# Patient Record
Sex: Female | Born: 1955 | Race: Black or African American | Hispanic: No | Smoking: Never smoker
Health system: Southern US, Community
[De-identification: ages and names within clinical notes are randomized; demographics above are authoritative.]

## PROBLEM LIST (undated history)

## (undated) DIAGNOSIS — I2510 Atherosclerotic heart disease of native coronary artery without angina pectoris: Secondary | ICD-10-CM

## (undated) DIAGNOSIS — I219 Acute myocardial infarction, unspecified: Secondary | ICD-10-CM

## (undated) DIAGNOSIS — I10 Essential (primary) hypertension: Secondary | ICD-10-CM

## (undated) DIAGNOSIS — G43109 Migraine with aura, not intractable, without status migrainosus: Secondary | ICD-10-CM

## (undated) DIAGNOSIS — J45909 Unspecified asthma, uncomplicated: Secondary | ICD-10-CM

## (undated) DIAGNOSIS — D4959 Neoplasm of unspecified behavior of other genitourinary organ: Secondary | ICD-10-CM

## (undated) DIAGNOSIS — E119 Type 2 diabetes mellitus without complications: Secondary | ICD-10-CM

## (undated) DIAGNOSIS — E785 Hyperlipidemia, unspecified: Secondary | ICD-10-CM

## (undated) HISTORY — DX: Hyperlipidemia, unspecified: E78.5

## (undated) HISTORY — DX: Unspecified asthma, uncomplicated: J45.909

## (undated) HISTORY — PX: CARDIAC CATHETERIZATION: SHX172

## (undated) HISTORY — PX: CARDIAC SURGERY: SHX584

## (undated) HISTORY — PX: CHOLECYSTECTOMY: SHX55

## (undated) HISTORY — PX: ABDOMINAL HYSTERECTOMY: SHX81

## (undated) HISTORY — PX: CORONARY ANGIOPLASTY: SHX604

## (undated) HISTORY — DX: Migraine with aura, not intractable, without status migrainosus: G43.109

## (undated) HISTORY — PX: CORONARY ARTERY BYPASS GRAFT: SHX141

## (undated) HISTORY — DX: Acute myocardial infarction, unspecified: I21.9

## (undated) HISTORY — DX: Neoplasm of unspecified behavior of other genitourinary organ: D49.59

---

## 2011-09-13 DIAGNOSIS — N8000 Endometriosis of the uterus, unspecified: Secondary | ICD-10-CM | POA: Diagnosis not present

## 2011-09-13 DIAGNOSIS — D251 Intramural leiomyoma of uterus: Secondary | ICD-10-CM | POA: Diagnosis not present

## 2011-09-13 DIAGNOSIS — J45909 Unspecified asthma, uncomplicated: Secondary | ICD-10-CM | POA: Diagnosis present

## 2011-09-13 DIAGNOSIS — D259 Leiomyoma of uterus, unspecified: Secondary | ICD-10-CM | POA: Diagnosis not present

## 2011-09-13 DIAGNOSIS — IMO0002 Reserved for concepts with insufficient information to code with codable children: Secondary | ICD-10-CM | POA: Diagnosis present

## 2011-09-13 DIAGNOSIS — Z79899 Other long term (current) drug therapy: Secondary | ICD-10-CM | POA: Diagnosis not present

## 2011-09-13 DIAGNOSIS — N871 Moderate cervical dysplasia: Secondary | ICD-10-CM | POA: Diagnosis not present

## 2011-09-13 DIAGNOSIS — N870 Mild cervical dysplasia: Secondary | ICD-10-CM | POA: Diagnosis not present

## 2011-10-27 DIAGNOSIS — R300 Dysuria: Secondary | ICD-10-CM | POA: Diagnosis not present

## 2011-11-01 DIAGNOSIS — K089 Disorder of teeth and supporting structures, unspecified: Secondary | ICD-10-CM | POA: Diagnosis not present

## 2011-11-01 DIAGNOSIS — Z7982 Long term (current) use of aspirin: Secondary | ICD-10-CM | POA: Diagnosis not present

## 2011-11-01 DIAGNOSIS — K047 Periapical abscess without sinus: Secondary | ICD-10-CM | POA: Diagnosis not present

## 2011-11-01 DIAGNOSIS — I10 Essential (primary) hypertension: Secondary | ICD-10-CM | POA: Diagnosis not present

## 2011-11-01 DIAGNOSIS — Z79899 Other long term (current) drug therapy: Secondary | ICD-10-CM | POA: Diagnosis not present

## 2011-11-10 DIAGNOSIS — G43019 Migraine without aura, intractable, without status migrainosus: Secondary | ICD-10-CM | POA: Diagnosis not present

## 2011-11-10 DIAGNOSIS — I259 Chronic ischemic heart disease, unspecified: Secondary | ICD-10-CM | POA: Diagnosis not present

## 2011-11-10 DIAGNOSIS — E119 Type 2 diabetes mellitus without complications: Secondary | ICD-10-CM | POA: Diagnosis not present

## 2011-11-10 DIAGNOSIS — R252 Cramp and spasm: Secondary | ICD-10-CM | POA: Diagnosis not present

## 2011-11-10 DIAGNOSIS — I10 Essential (primary) hypertension: Secondary | ICD-10-CM | POA: Diagnosis not present

## 2011-11-10 DIAGNOSIS — E782 Mixed hyperlipidemia: Secondary | ICD-10-CM | POA: Diagnosis not present

## 2011-11-18 DIAGNOSIS — J45909 Unspecified asthma, uncomplicated: Secondary | ICD-10-CM | POA: Diagnosis not present

## 2011-11-22 DIAGNOSIS — M79609 Pain in unspecified limb: Secondary | ICD-10-CM | POA: Diagnosis not present

## 2011-12-01 DIAGNOSIS — IMO0001 Reserved for inherently not codable concepts without codable children: Secondary | ICD-10-CM | POA: Diagnosis not present

## 2011-12-01 DIAGNOSIS — I10 Essential (primary) hypertension: Secondary | ICD-10-CM | POA: Diagnosis not present

## 2011-12-13 DIAGNOSIS — J449 Chronic obstructive pulmonary disease, unspecified: Secondary | ICD-10-CM | POA: Diagnosis not present

## 2011-12-13 DIAGNOSIS — I2510 Atherosclerotic heart disease of native coronary artery without angina pectoris: Secondary | ICD-10-CM | POA: Diagnosis not present

## 2011-12-13 DIAGNOSIS — I10 Essential (primary) hypertension: Secondary | ICD-10-CM | POA: Diagnosis not present

## 2011-12-13 DIAGNOSIS — R079 Chest pain, unspecified: Secondary | ICD-10-CM | POA: Diagnosis not present

## 2012-01-24 DIAGNOSIS — M79609 Pain in unspecified limb: Secondary | ICD-10-CM | POA: Diagnosis not present

## 2012-01-31 DIAGNOSIS — Z803 Family history of malignant neoplasm of breast: Secondary | ICD-10-CM | POA: Diagnosis not present

## 2012-01-31 DIAGNOSIS — Z1231 Encounter for screening mammogram for malignant neoplasm of breast: Secondary | ICD-10-CM | POA: Diagnosis not present

## 2012-02-01 DIAGNOSIS — I10 Essential (primary) hypertension: Secondary | ICD-10-CM | POA: Diagnosis not present

## 2012-02-01 DIAGNOSIS — E785 Hyperlipidemia, unspecified: Secondary | ICD-10-CM | POA: Diagnosis not present

## 2012-02-01 DIAGNOSIS — Z713 Dietary counseling and surveillance: Secondary | ICD-10-CM | POA: Diagnosis not present

## 2012-02-01 DIAGNOSIS — IMO0001 Reserved for inherently not codable concepts without codable children: Secondary | ICD-10-CM | POA: Diagnosis not present

## 2012-02-07 DIAGNOSIS — I10 Essential (primary) hypertension: Secondary | ICD-10-CM | POA: Diagnosis not present

## 2012-02-07 DIAGNOSIS — E785 Hyperlipidemia, unspecified: Secondary | ICD-10-CM | POA: Diagnosis not present

## 2012-02-07 DIAGNOSIS — Z713 Dietary counseling and surveillance: Secondary | ICD-10-CM | POA: Diagnosis not present

## 2012-02-07 DIAGNOSIS — IMO0001 Reserved for inherently not codable concepts without codable children: Secondary | ICD-10-CM | POA: Diagnosis not present

## 2012-02-14 DIAGNOSIS — J449 Chronic obstructive pulmonary disease, unspecified: Secondary | ICD-10-CM | POA: Diagnosis not present

## 2012-02-14 DIAGNOSIS — R079 Chest pain, unspecified: Secondary | ICD-10-CM | POA: Diagnosis not present

## 2012-02-14 DIAGNOSIS — I2510 Atherosclerotic heart disease of native coronary artery without angina pectoris: Secondary | ICD-10-CM | POA: Diagnosis not present

## 2012-02-14 DIAGNOSIS — I10 Essential (primary) hypertension: Secondary | ICD-10-CM | POA: Diagnosis not present

## 2012-02-15 DIAGNOSIS — IMO0001 Reserved for inherently not codable concepts without codable children: Secondary | ICD-10-CM | POA: Diagnosis not present

## 2012-02-15 DIAGNOSIS — Z713 Dietary counseling and surveillance: Secondary | ICD-10-CM | POA: Diagnosis not present

## 2012-02-15 DIAGNOSIS — E785 Hyperlipidemia, unspecified: Secondary | ICD-10-CM | POA: Diagnosis not present

## 2012-02-15 DIAGNOSIS — I10 Essential (primary) hypertension: Secondary | ICD-10-CM | POA: Diagnosis not present

## 2012-02-29 DIAGNOSIS — E119 Type 2 diabetes mellitus without complications: Secondary | ICD-10-CM | POA: Diagnosis not present

## 2012-02-29 DIAGNOSIS — I2510 Atherosclerotic heart disease of native coronary artery without angina pectoris: Secondary | ICD-10-CM | POA: Diagnosis not present

## 2012-02-29 DIAGNOSIS — R079 Chest pain, unspecified: Secondary | ICD-10-CM | POA: Diagnosis not present

## 2012-02-29 DIAGNOSIS — J449 Chronic obstructive pulmonary disease, unspecified: Secondary | ICD-10-CM | POA: Diagnosis not present

## 2012-03-08 DIAGNOSIS — E119 Type 2 diabetes mellitus without complications: Secondary | ICD-10-CM | POA: Diagnosis not present

## 2012-03-08 DIAGNOSIS — G4700 Insomnia, unspecified: Secondary | ICD-10-CM | POA: Diagnosis not present

## 2012-03-08 DIAGNOSIS — R0609 Other forms of dyspnea: Secondary | ICD-10-CM | POA: Diagnosis not present

## 2012-03-08 DIAGNOSIS — I10 Essential (primary) hypertension: Secondary | ICD-10-CM | POA: Diagnosis not present

## 2012-03-08 DIAGNOSIS — E785 Hyperlipidemia, unspecified: Secondary | ICD-10-CM | POA: Diagnosis not present

## 2012-03-08 DIAGNOSIS — Z951 Presence of aortocoronary bypass graft: Secondary | ICD-10-CM | POA: Diagnosis not present

## 2012-03-08 DIAGNOSIS — R079 Chest pain, unspecified: Secondary | ICD-10-CM | POA: Diagnosis not present

## 2012-03-08 DIAGNOSIS — R0989 Other specified symptoms and signs involving the circulatory and respiratory systems: Secondary | ICD-10-CM | POA: Diagnosis not present

## 2012-03-08 DIAGNOSIS — Z880 Allergy status to penicillin: Secondary | ICD-10-CM | POA: Diagnosis not present

## 2012-03-08 DIAGNOSIS — I2510 Atherosclerotic heart disease of native coronary artery without angina pectoris: Secondary | ICD-10-CM | POA: Diagnosis not present

## 2012-03-20 DIAGNOSIS — Z79899 Other long term (current) drug therapy: Secondary | ICD-10-CM | POA: Diagnosis not present

## 2012-03-20 DIAGNOSIS — G4700 Insomnia, unspecified: Secondary | ICD-10-CM | POA: Diagnosis not present

## 2012-03-20 DIAGNOSIS — R079 Chest pain, unspecified: Secondary | ICD-10-CM | POA: Diagnosis not present

## 2012-03-20 DIAGNOSIS — I252 Old myocardial infarction: Secondary | ICD-10-CM | POA: Diagnosis not present

## 2012-03-20 DIAGNOSIS — Z951 Presence of aortocoronary bypass graft: Secondary | ICD-10-CM | POA: Diagnosis not present

## 2012-03-20 DIAGNOSIS — E785 Hyperlipidemia, unspecified: Secondary | ICD-10-CM | POA: Diagnosis not present

## 2012-03-20 DIAGNOSIS — I2510 Atherosclerotic heart disease of native coronary artery without angina pectoris: Secondary | ICD-10-CM | POA: Diagnosis not present

## 2012-03-20 DIAGNOSIS — R61 Generalized hyperhidrosis: Secondary | ICD-10-CM | POA: Diagnosis not present

## 2012-03-20 DIAGNOSIS — E876 Hypokalemia: Secondary | ICD-10-CM | POA: Diagnosis not present

## 2012-03-20 DIAGNOSIS — IMO0001 Reserved for inherently not codable concepts without codable children: Secondary | ICD-10-CM | POA: Diagnosis not present

## 2012-03-20 DIAGNOSIS — R110 Nausea: Secondary | ICD-10-CM | POA: Diagnosis not present

## 2012-03-20 DIAGNOSIS — R062 Wheezing: Secondary | ICD-10-CM | POA: Diagnosis not present

## 2012-03-20 DIAGNOSIS — Z7982 Long term (current) use of aspirin: Secondary | ICD-10-CM | POA: Diagnosis not present

## 2012-03-20 DIAGNOSIS — I517 Cardiomegaly: Secondary | ICD-10-CM | POA: Diagnosis not present

## 2012-03-20 DIAGNOSIS — I10 Essential (primary) hypertension: Secondary | ICD-10-CM | POA: Diagnosis not present

## 2012-03-20 DIAGNOSIS — E119 Type 2 diabetes mellitus without complications: Secondary | ICD-10-CM | POA: Diagnosis not present

## 2012-03-20 DIAGNOSIS — I498 Other specified cardiac arrhythmias: Secondary | ICD-10-CM | POA: Diagnosis not present

## 2012-03-20 DIAGNOSIS — E782 Mixed hyperlipidemia: Secondary | ICD-10-CM | POA: Diagnosis not present

## 2012-03-20 DIAGNOSIS — R209 Unspecified disturbances of skin sensation: Secondary | ICD-10-CM | POA: Diagnosis not present

## 2012-03-21 DIAGNOSIS — R079 Chest pain, unspecified: Secondary | ICD-10-CM | POA: Diagnosis not present

## 2012-03-21 DIAGNOSIS — E782 Mixed hyperlipidemia: Secondary | ICD-10-CM | POA: Diagnosis not present

## 2012-03-21 DIAGNOSIS — I10 Essential (primary) hypertension: Secondary | ICD-10-CM | POA: Diagnosis not present

## 2012-03-21 DIAGNOSIS — I252 Old myocardial infarction: Secondary | ICD-10-CM | POA: Diagnosis not present

## 2012-03-23 DIAGNOSIS — I2510 Atherosclerotic heart disease of native coronary artery without angina pectoris: Secondary | ICD-10-CM | POA: Diagnosis not present

## 2012-03-23 DIAGNOSIS — R0602 Shortness of breath: Secondary | ICD-10-CM | POA: Diagnosis not present

## 2012-03-23 DIAGNOSIS — J45901 Unspecified asthma with (acute) exacerbation: Secondary | ICD-10-CM | POA: Diagnosis not present

## 2012-03-23 DIAGNOSIS — E876 Hypokalemia: Secondary | ICD-10-CM | POA: Diagnosis not present

## 2012-03-31 DIAGNOSIS — R0602 Shortness of breath: Secondary | ICD-10-CM | POA: Diagnosis not present

## 2012-03-31 DIAGNOSIS — E876 Hypokalemia: Secondary | ICD-10-CM | POA: Diagnosis not present

## 2012-03-31 DIAGNOSIS — I2510 Atherosclerotic heart disease of native coronary artery without angina pectoris: Secondary | ICD-10-CM | POA: Diagnosis not present

## 2012-04-05 DIAGNOSIS — I10 Essential (primary) hypertension: Secondary | ICD-10-CM | POA: Diagnosis not present

## 2012-04-05 DIAGNOSIS — E119 Type 2 diabetes mellitus without complications: Secondary | ICD-10-CM | POA: Diagnosis not present

## 2012-04-05 DIAGNOSIS — I2510 Atherosclerotic heart disease of native coronary artery without angina pectoris: Secondary | ICD-10-CM | POA: Diagnosis not present

## 2012-04-05 DIAGNOSIS — J449 Chronic obstructive pulmonary disease, unspecified: Secondary | ICD-10-CM | POA: Diagnosis not present

## 2012-04-10 DIAGNOSIS — J45901 Unspecified asthma with (acute) exacerbation: Secondary | ICD-10-CM | POA: Diagnosis not present

## 2012-04-12 DIAGNOSIS — Z713 Dietary counseling and surveillance: Secondary | ICD-10-CM | POA: Diagnosis not present

## 2012-04-12 DIAGNOSIS — E785 Hyperlipidemia, unspecified: Secondary | ICD-10-CM | POA: Diagnosis not present

## 2012-04-12 DIAGNOSIS — I10 Essential (primary) hypertension: Secondary | ICD-10-CM | POA: Diagnosis not present

## 2012-04-12 DIAGNOSIS — IMO0001 Reserved for inherently not codable concepts without codable children: Secondary | ICD-10-CM | POA: Diagnosis not present

## 2012-04-26 DIAGNOSIS — I10 Essential (primary) hypertension: Secondary | ICD-10-CM | POA: Diagnosis not present

## 2012-04-26 DIAGNOSIS — E876 Hypokalemia: Secondary | ICD-10-CM | POA: Diagnosis not present

## 2012-04-28 DIAGNOSIS — IMO0001 Reserved for inherently not codable concepts without codable children: Secondary | ICD-10-CM | POA: Diagnosis not present

## 2012-04-28 DIAGNOSIS — I10 Essential (primary) hypertension: Secondary | ICD-10-CM | POA: Diagnosis not present

## 2012-04-28 DIAGNOSIS — Z713 Dietary counseling and surveillance: Secondary | ICD-10-CM | POA: Diagnosis not present

## 2012-04-28 DIAGNOSIS — E785 Hyperlipidemia, unspecified: Secondary | ICD-10-CM | POA: Diagnosis not present

## 2012-06-05 DIAGNOSIS — Z23 Encounter for immunization: Secondary | ICD-10-CM | POA: Diagnosis not present

## 2012-07-07 DIAGNOSIS — E119 Type 2 diabetes mellitus without complications: Secondary | ICD-10-CM | POA: Diagnosis not present

## 2012-07-07 DIAGNOSIS — I25810 Atherosclerosis of coronary artery bypass graft(s) without angina pectoris: Secondary | ICD-10-CM | POA: Diagnosis not present

## 2012-07-07 DIAGNOSIS — I252 Old myocardial infarction: Secondary | ICD-10-CM | POA: Diagnosis not present

## 2012-07-07 DIAGNOSIS — I10 Essential (primary) hypertension: Secondary | ICD-10-CM | POA: Diagnosis not present

## 2012-07-07 DIAGNOSIS — J40 Bronchitis, not specified as acute or chronic: Secondary | ICD-10-CM | POA: Diagnosis not present

## 2012-07-07 DIAGNOSIS — Z880 Allergy status to penicillin: Secondary | ICD-10-CM | POA: Diagnosis not present

## 2012-07-07 DIAGNOSIS — J45909 Unspecified asthma, uncomplicated: Secondary | ICD-10-CM | POA: Diagnosis not present

## 2012-07-07 DIAGNOSIS — E785 Hyperlipidemia, unspecified: Secondary | ICD-10-CM | POA: Diagnosis not present

## 2012-07-17 DIAGNOSIS — J45909 Unspecified asthma, uncomplicated: Secondary | ICD-10-CM | POA: Diagnosis not present

## 2012-07-17 DIAGNOSIS — E119 Type 2 diabetes mellitus without complications: Secondary | ICD-10-CM | POA: Diagnosis not present

## 2012-07-17 DIAGNOSIS — I2510 Atherosclerotic heart disease of native coronary artery without angina pectoris: Secondary | ICD-10-CM | POA: Diagnosis not present

## 2012-07-17 DIAGNOSIS — F411 Generalized anxiety disorder: Secondary | ICD-10-CM | POA: Diagnosis not present

## 2012-07-17 DIAGNOSIS — I10 Essential (primary) hypertension: Secondary | ICD-10-CM | POA: Diagnosis not present

## 2012-07-17 DIAGNOSIS — E785 Hyperlipidemia, unspecified: Secondary | ICD-10-CM | POA: Diagnosis not present

## 2012-07-18 DIAGNOSIS — G5600 Carpal tunnel syndrome, unspecified upper limb: Secondary | ICD-10-CM | POA: Diagnosis not present

## 2012-07-18 DIAGNOSIS — G43019 Migraine without aura, intractable, without status migrainosus: Secondary | ICD-10-CM | POA: Diagnosis not present

## 2012-07-18 DIAGNOSIS — R51 Headache: Secondary | ICD-10-CM | POA: Diagnosis not present

## 2012-08-21 DIAGNOSIS — J019 Acute sinusitis, unspecified: Secondary | ICD-10-CM | POA: Diagnosis not present

## 2012-08-21 DIAGNOSIS — IMO0001 Reserved for inherently not codable concepts without codable children: Secondary | ICD-10-CM | POA: Diagnosis not present

## 2012-09-01 DIAGNOSIS — Z5189 Encounter for other specified aftercare: Secondary | ICD-10-CM | POA: Diagnosis not present

## 2012-10-10 DIAGNOSIS — G5600 Carpal tunnel syndrome, unspecified upper limb: Secondary | ICD-10-CM | POA: Diagnosis not present

## 2012-10-10 DIAGNOSIS — R51 Headache: Secondary | ICD-10-CM | POA: Diagnosis not present

## 2012-10-10 DIAGNOSIS — G43019 Migraine without aura, intractable, without status migrainosus: Secondary | ICD-10-CM | POA: Diagnosis not present

## 2012-11-15 DIAGNOSIS — Z79890 Hormone replacement therapy (postmenopausal): Secondary | ICD-10-CM | POA: Diagnosis not present

## 2012-11-15 DIAGNOSIS — E8941 Symptomatic postprocedural ovarian failure: Secondary | ICD-10-CM | POA: Diagnosis not present

## 2012-11-15 DIAGNOSIS — Z9189 Other specified personal risk factors, not elsewhere classified: Secondary | ICD-10-CM | POA: Diagnosis not present

## 2012-12-19 DIAGNOSIS — E785 Hyperlipidemia, unspecified: Secondary | ICD-10-CM | POA: Diagnosis not present

## 2012-12-19 DIAGNOSIS — I10 Essential (primary) hypertension: Secondary | ICD-10-CM | POA: Diagnosis not present

## 2012-12-19 DIAGNOSIS — E669 Obesity, unspecified: Secondary | ICD-10-CM | POA: Diagnosis not present

## 2012-12-19 DIAGNOSIS — IMO0001 Reserved for inherently not codable concepts without codable children: Secondary | ICD-10-CM | POA: Diagnosis not present

## 2012-12-19 DIAGNOSIS — E119 Type 2 diabetes mellitus without complications: Secondary | ICD-10-CM | POA: Diagnosis not present

## 2012-12-27 DIAGNOSIS — I2510 Atherosclerotic heart disease of native coronary artery without angina pectoris: Secondary | ICD-10-CM | POA: Diagnosis not present

## 2012-12-27 DIAGNOSIS — E119 Type 2 diabetes mellitus without complications: Secondary | ICD-10-CM | POA: Diagnosis not present

## 2012-12-27 DIAGNOSIS — I10 Essential (primary) hypertension: Secondary | ICD-10-CM | POA: Diagnosis not present

## 2013-01-25 DIAGNOSIS — Z1211 Encounter for screening for malignant neoplasm of colon: Secondary | ICD-10-CM | POA: Diagnosis not present

## 2013-01-25 DIAGNOSIS — K5730 Diverticulosis of large intestine without perforation or abscess without bleeding: Secondary | ICD-10-CM | POA: Diagnosis not present

## 2013-02-28 DIAGNOSIS — Z803 Family history of malignant neoplasm of breast: Secondary | ICD-10-CM | POA: Diagnosis not present

## 2013-02-28 DIAGNOSIS — Z1231 Encounter for screening mammogram for malignant neoplasm of breast: Secondary | ICD-10-CM | POA: Diagnosis not present

## 2013-04-16 DIAGNOSIS — G43019 Migraine without aura, intractable, without status migrainosus: Secondary | ICD-10-CM | POA: Diagnosis not present

## 2013-04-16 DIAGNOSIS — R51 Headache: Secondary | ICD-10-CM | POA: Diagnosis not present

## 2013-04-16 DIAGNOSIS — G5600 Carpal tunnel syndrome, unspecified upper limb: Secondary | ICD-10-CM | POA: Diagnosis not present

## 2013-04-19 DIAGNOSIS — K5289 Other specified noninfective gastroenteritis and colitis: Secondary | ICD-10-CM | POA: Diagnosis not present

## 2013-05-21 DIAGNOSIS — IMO0002 Reserved for concepts with insufficient information to code with codable children: Secondary | ICD-10-CM | POA: Diagnosis not present

## 2013-05-21 DIAGNOSIS — Z7982 Long term (current) use of aspirin: Secondary | ICD-10-CM | POA: Diagnosis not present

## 2013-05-21 DIAGNOSIS — I10 Essential (primary) hypertension: Secondary | ICD-10-CM | POA: Diagnosis not present

## 2013-05-24 DIAGNOSIS — L0291 Cutaneous abscess, unspecified: Secondary | ICD-10-CM | POA: Diagnosis not present

## 2013-06-16 DIAGNOSIS — I2510 Atherosclerotic heart disease of native coronary artery without angina pectoris: Secondary | ICD-10-CM | POA: Diagnosis not present

## 2013-06-16 DIAGNOSIS — E119 Type 2 diabetes mellitus without complications: Secondary | ICD-10-CM | POA: Diagnosis not present

## 2013-06-16 DIAGNOSIS — I10 Essential (primary) hypertension: Secondary | ICD-10-CM | POA: Diagnosis not present

## 2013-06-16 DIAGNOSIS — I252 Old myocardial infarction: Secondary | ICD-10-CM | POA: Diagnosis not present

## 2013-06-16 DIAGNOSIS — Z79899 Other long term (current) drug therapy: Secondary | ICD-10-CM | POA: Diagnosis not present

## 2013-06-16 DIAGNOSIS — Z7982 Long term (current) use of aspirin: Secondary | ICD-10-CM | POA: Diagnosis not present

## 2013-06-16 DIAGNOSIS — IMO0002 Reserved for concepts with insufficient information to code with codable children: Secondary | ICD-10-CM | POA: Diagnosis not present

## 2013-07-11 DIAGNOSIS — J45909 Unspecified asthma, uncomplicated: Secondary | ICD-10-CM | POA: Diagnosis not present

## 2013-07-11 DIAGNOSIS — J209 Acute bronchitis, unspecified: Secondary | ICD-10-CM | POA: Diagnosis not present

## 2013-07-12 DIAGNOSIS — Z23 Encounter for immunization: Secondary | ICD-10-CM | POA: Diagnosis not present

## 2013-08-01 DIAGNOSIS — G5600 Carpal tunnel syndrome, unspecified upper limb: Secondary | ICD-10-CM | POA: Diagnosis not present

## 2013-08-01 DIAGNOSIS — G43019 Migraine without aura, intractable, without status migrainosus: Secondary | ICD-10-CM | POA: Diagnosis not present

## 2013-09-11 DIAGNOSIS — I10 Essential (primary) hypertension: Secondary | ICD-10-CM | POA: Diagnosis not present

## 2013-09-11 DIAGNOSIS — E785 Hyperlipidemia, unspecified: Secondary | ICD-10-CM | POA: Diagnosis not present

## 2013-09-11 DIAGNOSIS — R072 Precordial pain: Secondary | ICD-10-CM | POA: Diagnosis not present

## 2013-09-11 DIAGNOSIS — R071 Chest pain on breathing: Secondary | ICD-10-CM | POA: Diagnosis not present

## 2013-09-11 DIAGNOSIS — Z7982 Long term (current) use of aspirin: Secondary | ICD-10-CM | POA: Diagnosis not present

## 2013-09-11 DIAGNOSIS — Z951 Presence of aortocoronary bypass graft: Secondary | ICD-10-CM | POA: Diagnosis not present

## 2013-09-11 DIAGNOSIS — R059 Cough, unspecified: Secondary | ICD-10-CM | POA: Diagnosis not present

## 2013-09-11 DIAGNOSIS — E119 Type 2 diabetes mellitus without complications: Secondary | ICD-10-CM | POA: Diagnosis not present

## 2013-09-11 DIAGNOSIS — R05 Cough: Secondary | ICD-10-CM | POA: Diagnosis not present

## 2013-09-11 DIAGNOSIS — Z79899 Other long term (current) drug therapy: Secondary | ICD-10-CM | POA: Diagnosis not present

## 2013-09-11 DIAGNOSIS — R112 Nausea with vomiting, unspecified: Secondary | ICD-10-CM | POA: Diagnosis not present

## 2013-09-11 DIAGNOSIS — I2510 Atherosclerotic heart disease of native coronary artery without angina pectoris: Secondary | ICD-10-CM | POA: Diagnosis not present

## 2013-09-11 DIAGNOSIS — R0789 Other chest pain: Secondary | ICD-10-CM | POA: Diagnosis not present

## 2013-09-12 DIAGNOSIS — E119 Type 2 diabetes mellitus without complications: Secondary | ICD-10-CM | POA: Diagnosis not present

## 2013-09-12 DIAGNOSIS — J189 Pneumonia, unspecified organism: Secondary | ICD-10-CM | POA: Diagnosis not present

## 2013-09-12 DIAGNOSIS — R079 Chest pain, unspecified: Secondary | ICD-10-CM | POA: Diagnosis not present

## 2013-09-12 DIAGNOSIS — I10 Essential (primary) hypertension: Secondary | ICD-10-CM | POA: Diagnosis not present

## 2013-10-11 DIAGNOSIS — I10 Essential (primary) hypertension: Secondary | ICD-10-CM | POA: Diagnosis not present

## 2013-10-11 DIAGNOSIS — R059 Cough, unspecified: Secondary | ICD-10-CM | POA: Diagnosis not present

## 2013-10-11 DIAGNOSIS — I2129 ST elevation (STEMI) myocardial infarction involving other sites: Secondary | ICD-10-CM | POA: Diagnosis not present

## 2013-10-11 DIAGNOSIS — R05 Cough: Secondary | ICD-10-CM | POA: Diagnosis not present

## 2013-10-11 DIAGNOSIS — E119 Type 2 diabetes mellitus without complications: Secondary | ICD-10-CM | POA: Diagnosis not present

## 2013-10-31 DIAGNOSIS — G5600 Carpal tunnel syndrome, unspecified upper limb: Secondary | ICD-10-CM | POA: Diagnosis not present

## 2013-10-31 DIAGNOSIS — G43019 Migraine without aura, intractable, without status migrainosus: Secondary | ICD-10-CM | POA: Diagnosis not present

## 2013-10-31 DIAGNOSIS — R51 Headache: Secondary | ICD-10-CM | POA: Diagnosis not present

## 2013-11-06 DIAGNOSIS — N393 Stress incontinence (female) (male): Secondary | ICD-10-CM | POA: Diagnosis not present

## 2013-11-14 DIAGNOSIS — M79609 Pain in unspecified limb: Secondary | ICD-10-CM | POA: Diagnosis not present

## 2013-11-21 DIAGNOSIS — IMO0002 Reserved for concepts with insufficient information to code with codable children: Secondary | ICD-10-CM | POA: Diagnosis not present

## 2013-11-21 DIAGNOSIS — Z803 Family history of malignant neoplasm of breast: Secondary | ICD-10-CM | POA: Diagnosis not present

## 2013-11-21 DIAGNOSIS — Z9189 Other specified personal risk factors, not elsewhere classified: Secondary | ICD-10-CM | POA: Diagnosis not present

## 2013-11-21 DIAGNOSIS — E669 Obesity, unspecified: Secondary | ICD-10-CM | POA: Diagnosis not present

## 2013-11-21 DIAGNOSIS — N951 Menopausal and female climacteric states: Secondary | ICD-10-CM | POA: Diagnosis not present

## 2013-11-27 DIAGNOSIS — IMO0002 Reserved for concepts with insufficient information to code with codable children: Secondary | ICD-10-CM | POA: Diagnosis not present

## 2013-11-27 DIAGNOSIS — M5020 Other cervical disc displacement, unspecified cervical region: Secondary | ICD-10-CM | POA: Diagnosis not present

## 2013-11-27 DIAGNOSIS — R937 Abnormal findings on diagnostic imaging of other parts of musculoskeletal system: Secondary | ICD-10-CM | POA: Diagnosis not present

## 2013-12-06 DIAGNOSIS — S8000XA Contusion of unspecified knee, initial encounter: Secondary | ICD-10-CM | POA: Diagnosis not present

## 2013-12-06 DIAGNOSIS — I10 Essential (primary) hypertension: Secondary | ICD-10-CM | POA: Diagnosis not present

## 2013-12-06 DIAGNOSIS — M25569 Pain in unspecified knee: Secondary | ICD-10-CM | POA: Diagnosis not present

## 2013-12-06 DIAGNOSIS — IMO0002 Reserved for concepts with insufficient information to code with codable children: Secondary | ICD-10-CM | POA: Diagnosis not present

## 2013-12-20 DIAGNOSIS — S8000XA Contusion of unspecified knee, initial encounter: Secondary | ICD-10-CM | POA: Diagnosis not present

## 2014-01-17 DIAGNOSIS — S8000XA Contusion of unspecified knee, initial encounter: Secondary | ICD-10-CM | POA: Diagnosis not present

## 2014-01-17 DIAGNOSIS — M25469 Effusion, unspecified knee: Secondary | ICD-10-CM | POA: Diagnosis not present

## 2014-01-18 DIAGNOSIS — E119 Type 2 diabetes mellitus without complications: Secondary | ICD-10-CM | POA: Diagnosis not present

## 2014-01-18 DIAGNOSIS — J45909 Unspecified asthma, uncomplicated: Secondary | ICD-10-CM | POA: Diagnosis not present

## 2014-01-18 DIAGNOSIS — I2129 ST elevation (STEMI) myocardial infarction involving other sites: Secondary | ICD-10-CM | POA: Diagnosis not present

## 2014-01-18 DIAGNOSIS — I10 Essential (primary) hypertension: Secondary | ICD-10-CM | POA: Diagnosis not present

## 2014-01-22 DIAGNOSIS — IMO0002 Reserved for concepts with insufficient information to code with codable children: Secondary | ICD-10-CM | POA: Diagnosis not present

## 2014-01-22 DIAGNOSIS — M1710 Unilateral primary osteoarthritis, unspecified knee: Secondary | ICD-10-CM | POA: Diagnosis not present

## 2014-01-22 DIAGNOSIS — M25569 Pain in unspecified knee: Secondary | ICD-10-CM | POA: Diagnosis not present

## 2014-01-24 DIAGNOSIS — M25469 Effusion, unspecified knee: Secondary | ICD-10-CM | POA: Diagnosis not present

## 2014-01-24 DIAGNOSIS — M1710 Unilateral primary osteoarthritis, unspecified knee: Secondary | ICD-10-CM | POA: Diagnosis not present

## 2014-02-20 DIAGNOSIS — R079 Chest pain, unspecified: Secondary | ICD-10-CM | POA: Diagnosis not present

## 2014-02-20 DIAGNOSIS — J449 Chronic obstructive pulmonary disease, unspecified: Secondary | ICD-10-CM | POA: Diagnosis not present

## 2014-02-20 DIAGNOSIS — I2510 Atherosclerotic heart disease of native coronary artery without angina pectoris: Secondary | ICD-10-CM | POA: Diagnosis not present

## 2014-02-20 DIAGNOSIS — E119 Type 2 diabetes mellitus without complications: Secondary | ICD-10-CM | POA: Diagnosis not present

## 2014-02-21 DIAGNOSIS — M1710 Unilateral primary osteoarthritis, unspecified knee: Secondary | ICD-10-CM | POA: Diagnosis not present

## 2014-02-21 DIAGNOSIS — IMO0002 Reserved for concepts with insufficient information to code with codable children: Secondary | ICD-10-CM | POA: Diagnosis not present

## 2014-02-21 DIAGNOSIS — M25469 Effusion, unspecified knee: Secondary | ICD-10-CM | POA: Diagnosis not present

## 2014-02-26 DIAGNOSIS — IMO0002 Reserved for concepts with insufficient information to code with codable children: Secondary | ICD-10-CM | POA: Diagnosis not present

## 2014-02-26 DIAGNOSIS — I509 Heart failure, unspecified: Secondary | ICD-10-CM | POA: Diagnosis not present

## 2014-02-26 DIAGNOSIS — Z79899 Other long term (current) drug therapy: Secondary | ICD-10-CM | POA: Diagnosis not present

## 2014-02-26 DIAGNOSIS — I10 Essential (primary) hypertension: Secondary | ICD-10-CM | POA: Diagnosis not present

## 2014-02-26 DIAGNOSIS — Z951 Presence of aortocoronary bypass graft: Secondary | ICD-10-CM | POA: Diagnosis not present

## 2014-02-26 DIAGNOSIS — J449 Chronic obstructive pulmonary disease, unspecified: Secondary | ICD-10-CM | POA: Diagnosis not present

## 2014-02-26 DIAGNOSIS — I2510 Atherosclerotic heart disease of native coronary artery without angina pectoris: Secondary | ICD-10-CM | POA: Diagnosis not present

## 2014-02-26 DIAGNOSIS — I252 Old myocardial infarction: Secondary | ICD-10-CM | POA: Diagnosis not present

## 2014-02-26 DIAGNOSIS — Z7982 Long term (current) use of aspirin: Secondary | ICD-10-CM | POA: Diagnosis not present

## 2014-02-26 DIAGNOSIS — E119 Type 2 diabetes mellitus without complications: Secondary | ICD-10-CM | POA: Diagnosis not present

## 2014-03-04 DIAGNOSIS — IMO0002 Reserved for concepts with insufficient information to code with codable children: Secondary | ICD-10-CM | POA: Diagnosis not present

## 2014-03-22 DIAGNOSIS — E785 Hyperlipidemia, unspecified: Secondary | ICD-10-CM | POA: Diagnosis not present

## 2014-03-22 DIAGNOSIS — R61 Generalized hyperhidrosis: Secondary | ICD-10-CM | POA: Diagnosis not present

## 2014-03-22 DIAGNOSIS — R9431 Abnormal electrocardiogram [ECG] [EKG]: Secondary | ICD-10-CM | POA: Diagnosis not present

## 2014-03-22 DIAGNOSIS — I4510 Unspecified right bundle-branch block: Secondary | ICD-10-CM | POA: Diagnosis not present

## 2014-03-22 DIAGNOSIS — I200 Unstable angina: Secondary | ICD-10-CM | POA: Diagnosis not present

## 2014-03-22 DIAGNOSIS — I519 Heart disease, unspecified: Secondary | ICD-10-CM | POA: Diagnosis not present

## 2014-03-22 DIAGNOSIS — R079 Chest pain, unspecified: Secondary | ICD-10-CM | POA: Diagnosis not present

## 2014-03-22 DIAGNOSIS — R0789 Other chest pain: Secondary | ICD-10-CM | POA: Diagnosis not present

## 2014-03-22 DIAGNOSIS — R072 Precordial pain: Secondary | ICD-10-CM | POA: Diagnosis not present

## 2014-03-22 DIAGNOSIS — I10 Essential (primary) hypertension: Secondary | ICD-10-CM | POA: Diagnosis not present

## 2014-03-22 DIAGNOSIS — E119 Type 2 diabetes mellitus without complications: Secondary | ICD-10-CM | POA: Diagnosis not present

## 2014-03-22 DIAGNOSIS — Z8249 Family history of ischemic heart disease and other diseases of the circulatory system: Secondary | ICD-10-CM | POA: Diagnosis not present

## 2014-03-22 DIAGNOSIS — R0602 Shortness of breath: Secondary | ICD-10-CM | POA: Diagnosis not present

## 2014-03-23 DIAGNOSIS — D649 Anemia, unspecified: Secondary | ICD-10-CM | POA: Diagnosis not present

## 2014-03-23 DIAGNOSIS — E669 Obesity, unspecified: Secondary | ICD-10-CM | POA: Diagnosis not present

## 2014-03-23 DIAGNOSIS — I517 Cardiomegaly: Secondary | ICD-10-CM | POA: Diagnosis not present

## 2014-03-23 DIAGNOSIS — E7800 Pure hypercholesterolemia, unspecified: Secondary | ICD-10-CM | POA: Diagnosis not present

## 2014-03-23 DIAGNOSIS — M66329 Spontaneous rupture of flexor tendons, unspecified upper arm: Secondary | ICD-10-CM | POA: Diagnosis not present

## 2014-03-23 DIAGNOSIS — G43909 Migraine, unspecified, not intractable, without status migrainosus: Secondary | ICD-10-CM | POA: Diagnosis not present

## 2014-03-23 DIAGNOSIS — R0789 Other chest pain: Secondary | ICD-10-CM | POA: Diagnosis not present

## 2014-03-23 DIAGNOSIS — I10 Essential (primary) hypertension: Secondary | ICD-10-CM | POA: Diagnosis not present

## 2014-03-23 DIAGNOSIS — I2510 Atherosclerotic heart disease of native coronary artery without angina pectoris: Secondary | ICD-10-CM | POA: Diagnosis not present

## 2014-03-23 DIAGNOSIS — Z7901 Long term (current) use of anticoagulants: Secondary | ICD-10-CM | POA: Diagnosis not present

## 2014-03-23 DIAGNOSIS — Z8249 Family history of ischemic heart disease and other diseases of the circulatory system: Secondary | ICD-10-CM | POA: Diagnosis not present

## 2014-03-23 DIAGNOSIS — M25819 Other specified joint disorders, unspecified shoulder: Secondary | ICD-10-CM | POA: Diagnosis not present

## 2014-03-23 DIAGNOSIS — Z6837 Body mass index (BMI) 37.0-37.9, adult: Secondary | ICD-10-CM | POA: Diagnosis not present

## 2014-03-23 DIAGNOSIS — J9819 Other pulmonary collapse: Secondary | ICD-10-CM | POA: Diagnosis not present

## 2014-03-23 DIAGNOSIS — Z7982 Long term (current) use of aspirin: Secondary | ICD-10-CM | POA: Diagnosis not present

## 2014-03-23 DIAGNOSIS — K279 Peptic ulcer, site unspecified, unspecified as acute or chronic, without hemorrhage or perforation: Secondary | ICD-10-CM | POA: Diagnosis not present

## 2014-03-23 DIAGNOSIS — E119 Type 2 diabetes mellitus without complications: Secondary | ICD-10-CM | POA: Diagnosis not present

## 2014-03-23 DIAGNOSIS — K219 Gastro-esophageal reflux disease without esophagitis: Secondary | ICD-10-CM | POA: Diagnosis not present

## 2014-03-23 DIAGNOSIS — Z79899 Other long term (current) drug therapy: Secondary | ICD-10-CM | POA: Diagnosis not present

## 2014-03-23 DIAGNOSIS — Z951 Presence of aortocoronary bypass graft: Secondary | ICD-10-CM | POA: Diagnosis not present

## 2014-03-23 DIAGNOSIS — E876 Hypokalemia: Secondary | ICD-10-CM | POA: Diagnosis not present

## 2014-03-23 DIAGNOSIS — R079 Chest pain, unspecified: Secondary | ICD-10-CM | POA: Diagnosis not present

## 2014-03-23 DIAGNOSIS — J45909 Unspecified asthma, uncomplicated: Secondary | ICD-10-CM | POA: Diagnosis not present

## 2014-03-24 DIAGNOSIS — R079 Chest pain, unspecified: Secondary | ICD-10-CM | POA: Diagnosis not present

## 2014-03-24 DIAGNOSIS — R059 Cough, unspecified: Secondary | ICD-10-CM | POA: Diagnosis not present

## 2014-03-24 DIAGNOSIS — S43499A Other sprain of unspecified shoulder joint, initial encounter: Secondary | ICD-10-CM | POA: Diagnosis not present

## 2014-03-24 DIAGNOSIS — I2510 Atherosclerotic heart disease of native coronary artery without angina pectoris: Secondary | ICD-10-CM | POA: Diagnosis not present

## 2014-03-24 DIAGNOSIS — I5021 Acute systolic (congestive) heart failure: Secondary | ICD-10-CM | POA: Diagnosis not present

## 2014-03-24 DIAGNOSIS — M79609 Pain in unspecified limb: Secondary | ICD-10-CM | POA: Diagnosis not present

## 2014-03-24 DIAGNOSIS — M19019 Primary osteoarthritis, unspecified shoulder: Secondary | ICD-10-CM | POA: Diagnosis not present

## 2014-03-24 DIAGNOSIS — E119 Type 2 diabetes mellitus without complications: Secondary | ICD-10-CM | POA: Diagnosis not present

## 2014-03-24 DIAGNOSIS — E7800 Pure hypercholesterolemia, unspecified: Secondary | ICD-10-CM | POA: Diagnosis not present

## 2014-03-24 DIAGNOSIS — R0789 Other chest pain: Secondary | ICD-10-CM | POA: Diagnosis not present

## 2014-03-24 DIAGNOSIS — R05 Cough: Secondary | ICD-10-CM | POA: Diagnosis not present

## 2014-03-24 DIAGNOSIS — M66329 Spontaneous rupture of flexor tendons, unspecified upper arm: Secondary | ICD-10-CM | POA: Diagnosis not present

## 2014-03-24 DIAGNOSIS — I10 Essential (primary) hypertension: Secondary | ICD-10-CM | POA: Diagnosis not present

## 2014-03-25 DIAGNOSIS — I5021 Acute systolic (congestive) heart failure: Secondary | ICD-10-CM | POA: Diagnosis not present

## 2014-03-25 DIAGNOSIS — M25519 Pain in unspecified shoulder: Secondary | ICD-10-CM | POA: Diagnosis not present

## 2014-03-25 DIAGNOSIS — I10 Essential (primary) hypertension: Secondary | ICD-10-CM | POA: Diagnosis not present

## 2014-03-25 DIAGNOSIS — R0789 Other chest pain: Secondary | ICD-10-CM | POA: Diagnosis not present

## 2014-03-25 DIAGNOSIS — S43439A Superior glenoid labrum lesion of unspecified shoulder, initial encounter: Secondary | ICD-10-CM | POA: Diagnosis not present

## 2014-03-25 DIAGNOSIS — R079 Chest pain, unspecified: Secondary | ICD-10-CM | POA: Diagnosis not present

## 2014-03-25 DIAGNOSIS — M66329 Spontaneous rupture of flexor tendons, unspecified upper arm: Secondary | ICD-10-CM | POA: Diagnosis not present

## 2014-03-25 DIAGNOSIS — E119 Type 2 diabetes mellitus without complications: Secondary | ICD-10-CM | POA: Diagnosis not present

## 2014-03-25 DIAGNOSIS — I2510 Atherosclerotic heart disease of native coronary artery without angina pectoris: Secondary | ICD-10-CM | POA: Diagnosis not present

## 2014-03-25 DIAGNOSIS — E7800 Pure hypercholesterolemia, unspecified: Secondary | ICD-10-CM | POA: Diagnosis not present

## 2014-03-26 DIAGNOSIS — E119 Type 2 diabetes mellitus without complications: Secondary | ICD-10-CM | POA: Diagnosis not present

## 2014-03-26 DIAGNOSIS — I5021 Acute systolic (congestive) heart failure: Secondary | ICD-10-CM | POA: Diagnosis not present

## 2014-03-26 DIAGNOSIS — R079 Chest pain, unspecified: Secondary | ICD-10-CM | POA: Diagnosis not present

## 2014-03-26 DIAGNOSIS — M66329 Spontaneous rupture of flexor tendons, unspecified upper arm: Secondary | ICD-10-CM | POA: Diagnosis not present

## 2014-03-26 DIAGNOSIS — I2510 Atherosclerotic heart disease of native coronary artery without angina pectoris: Secondary | ICD-10-CM | POA: Diagnosis not present

## 2014-03-26 DIAGNOSIS — R0789 Other chest pain: Secondary | ICD-10-CM | POA: Diagnosis not present

## 2014-03-26 DIAGNOSIS — I10 Essential (primary) hypertension: Secondary | ICD-10-CM | POA: Diagnosis not present

## 2014-03-26 DIAGNOSIS — E7800 Pure hypercholesterolemia, unspecified: Secondary | ICD-10-CM | POA: Diagnosis not present

## 2014-04-18 DIAGNOSIS — M25469 Effusion, unspecified knee: Secondary | ICD-10-CM | POA: Diagnosis not present

## 2014-04-24 DIAGNOSIS — Z803 Family history of malignant neoplasm of breast: Secondary | ICD-10-CM | POA: Diagnosis not present

## 2014-04-30 DIAGNOSIS — R531 Weakness: Secondary | ICD-10-CM | POA: Diagnosis not present

## 2014-04-30 DIAGNOSIS — G5600 Carpal tunnel syndrome, unspecified upper limb: Secondary | ICD-10-CM | POA: Diagnosis not present

## 2014-04-30 DIAGNOSIS — G43019 Migraine without aura, intractable, without status migrainosus: Secondary | ICD-10-CM | POA: Diagnosis not present

## 2014-05-02 DIAGNOSIS — S83232D Complex tear of medial meniscus, current injury, left knee, subsequent encounter: Secondary | ICD-10-CM | POA: Diagnosis not present

## 2014-05-02 DIAGNOSIS — M25469 Effusion, unspecified knee: Secondary | ICD-10-CM | POA: Diagnosis not present

## 2014-07-15 DIAGNOSIS — E782 Mixed hyperlipidemia: Secondary | ICD-10-CM | POA: Diagnosis not present

## 2014-07-15 DIAGNOSIS — I10 Essential (primary) hypertension: Secondary | ICD-10-CM | POA: Diagnosis not present

## 2014-07-15 DIAGNOSIS — J449 Chronic obstructive pulmonary disease, unspecified: Secondary | ICD-10-CM | POA: Diagnosis not present

## 2014-07-15 DIAGNOSIS — E559 Vitamin D deficiency, unspecified: Secondary | ICD-10-CM | POA: Diagnosis not present

## 2014-07-15 DIAGNOSIS — E119 Type 2 diabetes mellitus without complications: Secondary | ICD-10-CM | POA: Diagnosis not present

## 2014-07-15 DIAGNOSIS — G43009 Migraine without aura, not intractable, without status migrainosus: Secondary | ICD-10-CM | POA: Diagnosis not present

## 2014-07-15 DIAGNOSIS — I2584 Coronary atherosclerosis due to calcified coronary lesion: Secondary | ICD-10-CM | POA: Diagnosis not present

## 2014-07-29 DIAGNOSIS — G4710 Hypersomnia, unspecified: Secondary | ICD-10-CM | POA: Diagnosis not present

## 2014-07-29 DIAGNOSIS — J4540 Moderate persistent asthma, uncomplicated: Secondary | ICD-10-CM | POA: Diagnosis not present

## 2014-07-29 DIAGNOSIS — Z23 Encounter for immunization: Secondary | ICD-10-CM | POA: Diagnosis not present

## 2014-07-29 DIAGNOSIS — E119 Type 2 diabetes mellitus without complications: Secondary | ICD-10-CM | POA: Diagnosis not present

## 2014-07-29 DIAGNOSIS — R0602 Shortness of breath: Secondary | ICD-10-CM | POA: Diagnosis not present

## 2014-07-30 DIAGNOSIS — I509 Heart failure, unspecified: Secondary | ICD-10-CM | POA: Diagnosis not present

## 2014-07-30 DIAGNOSIS — I25709 Atherosclerosis of coronary artery bypass graft(s), unspecified, with unspecified angina pectoris: Secondary | ICD-10-CM | POA: Diagnosis not present

## 2014-07-30 DIAGNOSIS — N951 Menopausal and female climacteric states: Secondary | ICD-10-CM | POA: Diagnosis not present

## 2014-07-30 DIAGNOSIS — R0602 Shortness of breath: Secondary | ICD-10-CM | POA: Diagnosis not present

## 2014-07-30 DIAGNOSIS — I10 Essential (primary) hypertension: Secondary | ICD-10-CM | POA: Diagnosis not present

## 2014-07-30 DIAGNOSIS — E119 Type 2 diabetes mellitus without complications: Secondary | ICD-10-CM | POA: Diagnosis not present

## 2014-07-30 DIAGNOSIS — I2584 Coronary atherosclerosis due to calcified coronary lesion: Secondary | ICD-10-CM | POA: Diagnosis not present

## 2014-07-30 DIAGNOSIS — E782 Mixed hyperlipidemia: Secondary | ICD-10-CM | POA: Insufficient documentation

## 2014-07-30 DIAGNOSIS — G43009 Migraine without aura, not intractable, without status migrainosus: Secondary | ICD-10-CM | POA: Diagnosis not present

## 2014-07-31 DIAGNOSIS — G4710 Hypersomnia, unspecified: Secondary | ICD-10-CM | POA: Diagnosis not present

## 2014-08-01 DIAGNOSIS — M1712 Unilateral primary osteoarthritis, left knee: Secondary | ICD-10-CM | POA: Diagnosis not present

## 2014-08-01 DIAGNOSIS — Z1231 Encounter for screening mammogram for malignant neoplasm of breast: Secondary | ICD-10-CM | POA: Diagnosis not present

## 2014-08-01 DIAGNOSIS — M25562 Pain in left knee: Secondary | ICD-10-CM | POA: Diagnosis not present

## 2014-08-01 DIAGNOSIS — R079 Chest pain, unspecified: Secondary | ICD-10-CM | POA: Diagnosis not present

## 2014-08-06 DIAGNOSIS — N951 Menopausal and female climacteric states: Secondary | ICD-10-CM | POA: Diagnosis not present

## 2014-08-06 DIAGNOSIS — R519 Headache, unspecified: Secondary | ICD-10-CM | POA: Insufficient documentation

## 2014-08-06 DIAGNOSIS — E119 Type 2 diabetes mellitus without complications: Secondary | ICD-10-CM | POA: Diagnosis not present

## 2014-08-06 DIAGNOSIS — I2584 Coronary atherosclerosis due to calcified coronary lesion: Secondary | ICD-10-CM | POA: Diagnosis not present

## 2014-08-06 DIAGNOSIS — G43009 Migraine without aura, not intractable, without status migrainosus: Secondary | ICD-10-CM | POA: Diagnosis not present

## 2014-08-06 DIAGNOSIS — R51 Headache: Secondary | ICD-10-CM

## 2014-08-06 DIAGNOSIS — J4540 Moderate persistent asthma, uncomplicated: Secondary | ICD-10-CM | POA: Diagnosis not present

## 2014-08-08 DIAGNOSIS — M1712 Unilateral primary osteoarthritis, left knee: Secondary | ICD-10-CM | POA: Diagnosis not present

## 2014-08-14 DIAGNOSIS — R0602 Shortness of breath: Secondary | ICD-10-CM | POA: Diagnosis not present

## 2014-08-15 DIAGNOSIS — G4710 Hypersomnia, unspecified: Secondary | ICD-10-CM | POA: Diagnosis not present

## 2014-08-15 DIAGNOSIS — J4540 Moderate persistent asthma, uncomplicated: Secondary | ICD-10-CM | POA: Diagnosis not present

## 2014-08-26 DIAGNOSIS — I25709 Atherosclerosis of coronary artery bypass graft(s), unspecified, with unspecified angina pectoris: Secondary | ICD-10-CM | POA: Diagnosis not present

## 2014-08-26 DIAGNOSIS — I5033 Acute on chronic diastolic (congestive) heart failure: Secondary | ICD-10-CM | POA: Insufficient documentation

## 2014-08-26 DIAGNOSIS — I25810 Atherosclerosis of coronary artery bypass graft(s) without angina pectoris: Secondary | ICD-10-CM | POA: Diagnosis not present

## 2014-08-26 DIAGNOSIS — E782 Mixed hyperlipidemia: Secondary | ICD-10-CM | POA: Diagnosis not present

## 2014-08-26 DIAGNOSIS — I5032 Chronic diastolic (congestive) heart failure: Secondary | ICD-10-CM | POA: Diagnosis not present

## 2014-08-26 DIAGNOSIS — I2510 Atherosclerotic heart disease of native coronary artery without angina pectoris: Secondary | ICD-10-CM | POA: Insufficient documentation

## 2014-08-26 DIAGNOSIS — I10 Essential (primary) hypertension: Secondary | ICD-10-CM | POA: Diagnosis not present

## 2014-08-26 DIAGNOSIS — R0789 Other chest pain: Secondary | ICD-10-CM | POA: Diagnosis not present

## 2014-08-27 DIAGNOSIS — N951 Menopausal and female climacteric states: Secondary | ICD-10-CM | POA: Diagnosis not present

## 2014-08-27 DIAGNOSIS — I10 Essential (primary) hypertension: Secondary | ICD-10-CM | POA: Diagnosis not present

## 2014-08-27 DIAGNOSIS — R51 Headache: Secondary | ICD-10-CM | POA: Diagnosis not present

## 2014-08-27 DIAGNOSIS — G4700 Insomnia, unspecified: Secondary | ICD-10-CM | POA: Diagnosis not present

## 2014-08-27 DIAGNOSIS — I2584 Coronary atherosclerosis due to calcified coronary lesion: Secondary | ICD-10-CM | POA: Diagnosis not present

## 2014-08-27 DIAGNOSIS — Z6841 Body Mass Index (BMI) 40.0 and over, adult: Secondary | ICD-10-CM | POA: Diagnosis not present

## 2014-08-27 DIAGNOSIS — J4540 Moderate persistent asthma, uncomplicated: Secondary | ICD-10-CM | POA: Diagnosis not present

## 2014-09-30 DIAGNOSIS — N951 Menopausal and female climacteric states: Secondary | ICD-10-CM | POA: Diagnosis not present

## 2014-09-30 DIAGNOSIS — J4540 Moderate persistent asthma, uncomplicated: Secondary | ICD-10-CM | POA: Diagnosis not present

## 2014-09-30 DIAGNOSIS — I10 Essential (primary) hypertension: Secondary | ICD-10-CM | POA: Diagnosis not present

## 2014-09-30 DIAGNOSIS — E119 Type 2 diabetes mellitus without complications: Secondary | ICD-10-CM | POA: Diagnosis not present

## 2014-10-15 ENCOUNTER — Emergency Department
Admit: 2014-10-15 | Disposition: A | Discharge status: Home / Self Care | Payer: Self-pay | Admitting: Emergency Medicine

## 2014-10-15 DIAGNOSIS — Z880 Allergy status to penicillin: Secondary | ICD-10-CM | POA: Diagnosis not present

## 2014-10-15 DIAGNOSIS — I10 Essential (primary) hypertension: Secondary | ICD-10-CM | POA: Diagnosis not present

## 2014-10-15 DIAGNOSIS — M1712 Unilateral primary osteoarthritis, left knee: Secondary | ICD-10-CM | POA: Diagnosis not present

## 2014-10-15 DIAGNOSIS — M179 Osteoarthritis of knee, unspecified: Secondary | ICD-10-CM | POA: Diagnosis not present

## 2014-10-15 DIAGNOSIS — E119 Type 2 diabetes mellitus without complications: Secondary | ICD-10-CM | POA: Diagnosis not present

## 2014-10-15 DIAGNOSIS — M25462 Effusion, left knee: Secondary | ICD-10-CM | POA: Diagnosis not present

## 2014-10-16 DIAGNOSIS — M1712 Unilateral primary osteoarthritis, left knee: Secondary | ICD-10-CM | POA: Diagnosis not present

## 2014-10-31 DIAGNOSIS — M25562 Pain in left knee: Secondary | ICD-10-CM | POA: Diagnosis not present

## 2014-10-31 DIAGNOSIS — I10 Essential (primary) hypertension: Secondary | ICD-10-CM | POA: Diagnosis not present

## 2014-10-31 DIAGNOSIS — M79662 Pain in left lower leg: Secondary | ICD-10-CM | POA: Diagnosis not present

## 2014-10-31 DIAGNOSIS — G4700 Insomnia, unspecified: Secondary | ICD-10-CM | POA: Diagnosis not present

## 2014-10-31 DIAGNOSIS — N951 Menopausal and female climacteric states: Secondary | ICD-10-CM | POA: Diagnosis not present

## 2014-10-31 DIAGNOSIS — M179 Osteoarthritis of knee, unspecified: Secondary | ICD-10-CM | POA: Diagnosis not present

## 2014-10-31 DIAGNOSIS — R079 Chest pain, unspecified: Secondary | ICD-10-CM | POA: Diagnosis not present

## 2014-10-31 DIAGNOSIS — E782 Mixed hyperlipidemia: Secondary | ICD-10-CM | POA: Diagnosis not present

## 2014-11-01 DIAGNOSIS — M1712 Unilateral primary osteoarthritis, left knee: Secondary | ICD-10-CM | POA: Diagnosis not present

## 2014-11-08 DIAGNOSIS — M1712 Unilateral primary osteoarthritis, left knee: Secondary | ICD-10-CM | POA: Diagnosis not present

## 2014-11-12 DIAGNOSIS — M1712 Unilateral primary osteoarthritis, left knee: Secondary | ICD-10-CM | POA: Diagnosis not present

## 2014-11-19 DIAGNOSIS — E119 Type 2 diabetes mellitus without complications: Secondary | ICD-10-CM | POA: Diagnosis not present

## 2014-11-19 DIAGNOSIS — I10 Essential (primary) hypertension: Secondary | ICD-10-CM | POA: Diagnosis not present

## 2014-11-19 DIAGNOSIS — M1712 Unilateral primary osteoarthritis, left knee: Secondary | ICD-10-CM | POA: Diagnosis not present

## 2014-11-19 DIAGNOSIS — M79662 Pain in left lower leg: Secondary | ICD-10-CM | POA: Diagnosis not present

## 2014-11-19 DIAGNOSIS — M25562 Pain in left knee: Secondary | ICD-10-CM | POA: Diagnosis not present

## 2014-11-26 DIAGNOSIS — M1712 Unilateral primary osteoarthritis, left knee: Secondary | ICD-10-CM | POA: Diagnosis not present

## 2014-11-29 DIAGNOSIS — M1712 Unilateral primary osteoarthritis, left knee: Secondary | ICD-10-CM | POA: Diagnosis not present

## 2014-12-18 DIAGNOSIS — M1712 Unilateral primary osteoarthritis, left knee: Secondary | ICD-10-CM | POA: Diagnosis not present

## 2014-12-18 DIAGNOSIS — L298 Other pruritus: Secondary | ICD-10-CM | POA: Diagnosis not present

## 2014-12-25 DIAGNOSIS — M1712 Unilateral primary osteoarthritis, left knee: Secondary | ICD-10-CM | POA: Diagnosis not present

## 2015-01-01 DIAGNOSIS — G4700 Insomnia, unspecified: Secondary | ICD-10-CM | POA: Diagnosis not present

## 2015-01-01 DIAGNOSIS — J4540 Moderate persistent asthma, uncomplicated: Secondary | ICD-10-CM | POA: Diagnosis not present

## 2015-01-01 DIAGNOSIS — M1712 Unilateral primary osteoarthritis, left knee: Secondary | ICD-10-CM | POA: Diagnosis not present

## 2015-01-09 DIAGNOSIS — Z0001 Encounter for general adult medical examination with abnormal findings: Secondary | ICD-10-CM | POA: Diagnosis not present

## 2015-01-09 DIAGNOSIS — R87810 Cervical high risk human papillomavirus (HPV) DNA test positive: Secondary | ICD-10-CM | POA: Diagnosis not present

## 2015-01-09 DIAGNOSIS — L2389 Allergic contact dermatitis due to other agents: Secondary | ICD-10-CM | POA: Diagnosis not present

## 2015-01-09 DIAGNOSIS — I10 Essential (primary) hypertension: Secondary | ICD-10-CM | POA: Diagnosis not present

## 2015-01-09 DIAGNOSIS — E119 Type 2 diabetes mellitus without complications: Secondary | ICD-10-CM | POA: Diagnosis not present

## 2015-01-09 DIAGNOSIS — J4540 Moderate persistent asthma, uncomplicated: Secondary | ICD-10-CM | POA: Diagnosis not present

## 2015-01-09 DIAGNOSIS — N951 Menopausal and female climacteric states: Secondary | ICD-10-CM | POA: Diagnosis not present

## 2015-01-09 DIAGNOSIS — I2584 Coronary atherosclerosis due to calcified coronary lesion: Secondary | ICD-10-CM | POA: Diagnosis not present

## 2015-01-09 DIAGNOSIS — Z124 Encounter for screening for malignant neoplasm of cervix: Secondary | ICD-10-CM | POA: Diagnosis not present

## 2015-01-13 DIAGNOSIS — H35033 Hypertensive retinopathy, bilateral: Secondary | ICD-10-CM | POA: Diagnosis not present

## 2015-01-13 DIAGNOSIS — H524 Presbyopia: Secondary | ICD-10-CM | POA: Diagnosis not present

## 2015-01-13 DIAGNOSIS — H52223 Regular astigmatism, bilateral: Secondary | ICD-10-CM | POA: Diagnosis not present

## 2015-01-13 DIAGNOSIS — E119 Type 2 diabetes mellitus without complications: Secondary | ICD-10-CM | POA: Diagnosis not present

## 2015-01-13 DIAGNOSIS — I10 Essential (primary) hypertension: Secondary | ICD-10-CM | POA: Diagnosis not present

## 2015-01-13 DIAGNOSIS — H5203 Hypermetropia, bilateral: Secondary | ICD-10-CM | POA: Diagnosis not present

## 2015-01-16 DIAGNOSIS — E2839 Other primary ovarian failure: Secondary | ICD-10-CM | POA: Diagnosis not present

## 2015-01-17 DIAGNOSIS — M1712 Unilateral primary osteoarthritis, left knee: Secondary | ICD-10-CM | POA: Diagnosis not present

## 2015-01-17 DIAGNOSIS — M25562 Pain in left knee: Secondary | ICD-10-CM | POA: Diagnosis not present

## 2015-01-28 DIAGNOSIS — R51 Headache: Secondary | ICD-10-CM | POA: Diagnosis not present

## 2015-01-29 DIAGNOSIS — L851 Acquired keratosis [keratoderma] palmaris et plantaris: Secondary | ICD-10-CM | POA: Diagnosis not present

## 2015-01-29 DIAGNOSIS — E1140 Type 2 diabetes mellitus with diabetic neuropathy, unspecified: Secondary | ICD-10-CM | POA: Diagnosis not present

## 2015-01-29 DIAGNOSIS — B351 Tinea unguium: Secondary | ICD-10-CM | POA: Diagnosis not present

## 2015-02-10 ENCOUNTER — Encounter: Payer: Medicare Other | Attending: Internal Medicine | Admitting: Dietician

## 2015-02-10 ENCOUNTER — Encounter: Payer: Self-pay | Admitting: Dietician

## 2015-02-10 VITALS — Ht 67.0 in | Wt 250.0 lb

## 2015-02-10 DIAGNOSIS — E119 Type 2 diabetes mellitus without complications: Secondary | ICD-10-CM | POA: Diagnosis not present

## 2015-02-10 NOTE — Patient Instructions (Signed)
   Try tracking your food intake by keeping a food diary or use MyFitnessPal or LoseIt app.   Mix whole milk and 2% and gradually add more 2%.   Keep starch portions to handful size, not more than fist-size, or 1/4th of our plate.  Follow 1500-calorie meal plan, use menu examples for ideas, and substitute vegetables you can't eat with ones that you can.

## 2015-02-10 NOTE — Progress Notes (Signed)
Medical Nutrition Therapy: Visit start time: 1430  end time: 1530  Assessment:  Diagnosis: diabetes Past medical history: HTN, CAD, hyperlipidemia, CHF,  Psychosocial issues/ stress concerns: moderate stress per patient, currently caring for grandson Preferred learning method:  . Auditory . Visual . Hands-on .   Current weight: 250lbs  Height: 5'7" Medications, supplements: reviewed list in chart Progress and evaluation: Patient reports elevated fasting BGs, usually in 200s, but post-meal BGs ranging 120s-160s.   Physical activity: walking, gym 30-23minutes 3 times a week  Dietary Intake:  Usual eating pattern includes 3 meals and 0 snacks per day. Dining out frequency: 0-1 meals per week.  Breakfast: diet breakfast sandwich, small amount juice Snack: usually none Lunch: bologna sandwich or hot dog, fruit Snack: usually none Supper: cooks chicken, pork chops, potatoes, always a greens, cabbage sundays or string beans.  Snack: usually none Beverages: water, some juice, crystal light. Rarely pepsi for caffeine  Nutrition Care Education: Topics covered: diabetes Basic nutrition: basic food groups, appropriate nutrient balance, appropriate meal and snack schedule, general nutrition guidelines    Weight control: benefits of weight control, behavioral changes for weight loss Diabetes:  goals for BGs, appropriate meal and snack schedule, appropriate carb intake and balance, 1500kcal meal plan for weight loss  Nutritional Diagnosis:  [place]-3.3 Overweight/obesity As related to excess caloric intake .  As evidenced by BMI of 39.2.  Intervention: Instruction as noted above.   Encouraged patient to track food intake.     Education Materials given:  . Plate Planner . Food lists/ Planning A Balanced Meal . Sample meal pattern/ menus: Quick and Healthy Meal Ideas . Goals/ instructions  Learner/ who was taught:  . Patient   Level of understanding: Marland Kitchen Verbalizes/ demonstrates  competency  Demonstrated degree of understanding via:   Teach back Learning barriers: . None  Willingness to learn/ readiness for change: . Eager, change in progress  Monitoring and Evaluation:  Dietary intake, exercise, BG control, and body weight      follow up: 03/05/15

## 2015-03-05 ENCOUNTER — Encounter: Payer: Medicare Other | Admitting: Dietician

## 2015-03-05 VITALS — Ht 67.0 in | Wt 247.6 lb

## 2015-03-05 DIAGNOSIS — E119 Type 2 diabetes mellitus without complications: Secondary | ICD-10-CM | POA: Diagnosis not present

## 2015-03-05 DIAGNOSIS — E669 Obesity, unspecified: Secondary | ICD-10-CM

## 2015-03-05 NOTE — Patient Instructions (Signed)
   Try Healthy Balance juice for a low-sugar option. For orange juice, try 50/50 which is 1/2 the sugar, or combine regular juice with orange crystal light or sugar free Nature's Twist to stretch the portion.  For breakfast, eat either cereal or a bagel/bread and make sure to include protein, such as an egg, or low fat cheese, or 1/4 cup nuts.   Keep up your healthy eating habits and exercise!

## 2015-03-05 NOTE — Progress Notes (Signed)
Medical Nutrition Therapy: Visit start time: 0900  end time: 0930  Assessment:  Diagnosis: obesity, diabetes Medical history changes: no changes per patient Psychosocial issues/ stress concerns: no changes per patient  Current weight: 247.6lbs  Height: 5'8" Medications, supplement changes: reviewed list in chart, no changes  Progress and evaluation: Patient states she has bought a sectioned plate to better portion foods. Is controlling starch portions.    Drinking milk less often.     Has added bedtime snack, and feels this has improved fasting bgs, now ranging in 140s typically.    Physical activity: gym 3 times a week for 2 hours. Cardio and strength training  Dietary Intake:  Usual eating pattern includes 3 meals and 2-3 snacks per day. Dining out frequency: 1 meals per week.  For 03/04/15: Breakfast:  small bowl cereal, bagel, small glass orange juice Snack: 2 graham crackers Lunch: fruit salad, grilled chicken Snack: small portion popcorn, no butter Supper: baked chicken, cabbage, rice, grape juice Snack: apple, orange Beverages: water 4-5 bottles daily, some fruit juices  Nutrition Care Education: Topics covered:  Basic nutrition: appropriate nutrient balance Weight control: behavioral changes for weight loss: reviewed some food portions Advanced nutrition: food choices for dining out Diabetes:  goals for BGs, appropriate carb intake and balance  Nutritional Diagnosis:  [place]-3.3 Overweight/obesity As related to history of excess caloric intake.  As evidenced by patient report, elevated BMI.  Intervention: Instruction as noted above.    Commended patient for changes made thus far.   Addressed importance of limiting sugar in beverages, and discussed low-sugar options.   Discussed options for balanced breakfast meals.  Education Materials given: (provided additional copies of previously-given materials on patient request) . Plate Planner . Food lists/ Planning A Balanced  Meal . Sample meal pattern/ menus . Goals/ instructions  Learner/ who was taught:  . Patient   Level of understanding: Marland Kitchen Verbalizes/ demonstrates competency  Demonstrated degree of understanding via:   Teach back Learning barriers: . None  Willingness to learn/ readiness for change: . Eager, change in progress   Monitoring and Evaluation:  Dietary intake, exercise, BG control, and body weight      follow up: by phone call in about 4 weeks

## 2015-03-10 DIAGNOSIS — R079 Chest pain, unspecified: Secondary | ICD-10-CM | POA: Diagnosis not present

## 2015-03-11 ENCOUNTER — Other Ambulatory Visit: Payer: Self-pay

## 2015-03-11 ENCOUNTER — Emergency Department
Admission: EM | Admit: 2015-03-11 | Discharge: 2015-03-11 | Disposition: A | Discharge status: Home / Self Care | Payer: Medicare Other | Attending: Emergency Medicine | Admitting: Emergency Medicine

## 2015-03-11 ENCOUNTER — Encounter: Payer: Self-pay | Admitting: Emergency Medicine

## 2015-03-11 ENCOUNTER — Emergency Department: Payer: Medicare Other

## 2015-03-11 DIAGNOSIS — I10 Essential (primary) hypertension: Secondary | ICD-10-CM | POA: Insufficient documentation

## 2015-03-11 DIAGNOSIS — R0602 Shortness of breath: Secondary | ICD-10-CM | POA: Diagnosis not present

## 2015-03-11 DIAGNOSIS — R51 Headache: Secondary | ICD-10-CM | POA: Diagnosis not present

## 2015-03-11 DIAGNOSIS — E119 Type 2 diabetes mellitus without complications: Secondary | ICD-10-CM | POA: Insufficient documentation

## 2015-03-11 DIAGNOSIS — Z880 Allergy status to penicillin: Secondary | ICD-10-CM | POA: Insufficient documentation

## 2015-03-11 DIAGNOSIS — R079 Chest pain, unspecified: Secondary | ICD-10-CM

## 2015-03-11 DIAGNOSIS — Z79899 Other long term (current) drug therapy: Secondary | ICD-10-CM | POA: Insufficient documentation

## 2015-03-11 LAB — CBC
HCT: 38.2 % (ref 35.0–47.0)
Hemoglobin: 12.5 g/dL (ref 12.0–16.0)
MCH: 26.3 pg (ref 26.0–34.0)
MCHC: 32.8 g/dL (ref 32.0–36.0)
MCV: 80.2 fL (ref 80.0–100.0)
PLATELETS: 130 10*3/uL — AB (ref 150–440)
RBC: 4.76 MIL/uL (ref 3.80–5.20)
RDW: 15.9 % — AB (ref 11.5–14.5)
WBC: 12.8 10*3/uL — AB (ref 3.6–11.0)

## 2015-03-11 LAB — TROPONIN I

## 2015-03-11 LAB — COMPREHENSIVE METABOLIC PANEL
ALT: 14 U/L (ref 14–54)
AST: 22 U/L (ref 15–41)
Albumin: 3.5 g/dL (ref 3.5–5.0)
Alkaline Phosphatase: 49 U/L (ref 38–126)
Anion gap: 9 (ref 5–15)
BUN: 9 mg/dL (ref 6–20)
CHLORIDE: 107 mmol/L (ref 101–111)
CO2: 23 mmol/L (ref 22–32)
Calcium: 8.8 mg/dL — ABNORMAL LOW (ref 8.9–10.3)
Creatinine, Ser: 0.79 mg/dL (ref 0.44–1.00)
Glucose, Bld: 132 mg/dL — ABNORMAL HIGH (ref 65–99)
POTASSIUM: 3.4 mmol/L — AB (ref 3.5–5.1)
Sodium: 139 mmol/L (ref 135–145)
Total Bilirubin: 0.3 mg/dL (ref 0.3–1.2)
Total Protein: 6.9 g/dL (ref 6.5–8.1)

## 2015-03-11 LAB — LIPASE, BLOOD: LIPASE: 20 U/L — AB (ref 22–51)

## 2015-03-11 MED ORDER — ONDANSETRON HCL 4 MG/2ML IJ SOLN
4.0000 mg | Freq: Once | INTRAMUSCULAR | Status: AC
  Administered 2015-03-11: 4 mg via INTRAVENOUS

## 2015-03-11 MED ORDER — MORPHINE SULFATE (PF) 2 MG/ML IV SOLN
2.0000 mg | Freq: Once | INTRAVENOUS | Status: AC
  Administered 2015-03-11: 2 mg via INTRAVENOUS

## 2015-03-11 MED ORDER — ONDANSETRON HCL 4 MG/2ML IJ SOLN
INTRAMUSCULAR | Status: AC
  Filled 2015-03-11: qty 2

## 2015-03-11 MED ORDER — NITROGLYCERIN 2 % TD OINT
1.0000 [in_us] | TOPICAL_OINTMENT | Freq: Once | TRANSDERMAL | Status: AC
  Administered 2015-03-11: 1 [in_us] via TOPICAL
  Filled 2015-03-11: qty 1

## 2015-03-11 NOTE — Discharge Instructions (Signed)

## 2015-03-11 NOTE — ED Notes (Signed)
Report given to Luis, RN.  

## 2015-03-11 NOTE — ED Provider Notes (Signed)
[facility] Emergency Department Provider Note  ____________________________________________  Time seen: Approximately 12:20 AM  I have reviewed the triage vital signs and the nursing notes.   HISTORY  Chief Complaint Chest Pain    HPI Felicia Woods is a 59 y.o. female who comes in this evening with chest pain. The patient reports her chest pain started around 10 PM. She reports that she was sitting watching television when she started having some chest pain at the bottom of her chest that radiates up towards her neck. The patient has a history of cardiac disease with a CABG in 2010. The patient took 2 nitroglycerin at home and when the pain did not improve she called EMS. The patient received 1 further nitroglycerin in the ambulance and her pain went from a 10 to a 9/10. The patient also received 324 aspirin by EMS as well. The patient denies any sweats or nausea but she does have some shortness of breath. She reports her last catheterization was 2-3 months ago.Patient reports that she vomited earlier today but did not think anything of it and was prior to the beginning of the pain. The patient reports that she had some pain in her right shoulder earlier today but did not associated with her chest pain. She also denies any blurred vision.   Past medical history Cardiac disease Hypertension Diabetes Hyperlipidemia Ovarian cancer  There are no active problems to display for this patient.   Past surgical history CABG Hysterectomy Cholecystectomy  Current Outpatient Rx  Name  Route  Sig  Dispense  Refill  . amLODipine (NORVASC) 5 MG tablet               . augmented betamethasone dipropionate (DIPROLENE-AF) 0.05 % cream               . bisoprolol-hydrochlorothiazide (ZIAC) 2.5-6.25 MG per tablet               . BRISDELLE 7.5 MG CAPS                 Dispense as written.   . budesonide-formoterol (SYMBICORT) 160-4.5 MCG/ACT inhaler    Inhalation   Inhale into the lungs.         . cyclobenzaprine (FLEXERIL) 10 MG tablet               . Diclofenac Sodium 1.5 % SOLN               . estradiol (ESTRACE) 0.5 MG tablet               . fluticasone (FLONASE) 50 MCG/ACT nasal spray               . furosemide (LASIX) 40 MG tablet               . glimepiride (AMARYL) 2 MG tablet   Oral   Take by mouth.         . hydrOXYzine (ATARAX/VISTARIL) 25 MG tablet   Oral   Take by mouth.         . losartan (COZAAR) 50 MG tablet   Oral   Take by mouth.         . metFORMIN (GLUCOPHAGE) 500 MG tablet   Oral   Take by mouth.         . metoprolol (LOPRESSOR) 50 MG tablet   Oral   Take by mouth.         . montelukast (SINGULAIR) 10 MG  tablet               . nitroGLYCERIN (NITROSTAT) 0.4 MG SL tablet   Sublingual   Place under the tongue.         . nortriptyline (PAMELOR) 10 MG capsule               . nystatin cream (MYCOSTATIN)               . predniSONE (DELTASONE) 10 MG tablet               . raloxifene (EVISTA) 60 MG tablet               . ranitidine (ZANTAC) 150 MG tablet               . sertraline (ZOLOFT) 100 MG tablet               . simvastatin (ZOCOR) 40 MG tablet               . VENTOLIN HFA 108 (90 BASE) MCG/ACT inhaler                 Dispense as written.   . VESICARE 10 MG tablet                 Dispense as written.   . zolpidem (AMBIEN) 5 MG tablet                 Allergies Penicillin g  History reviewed. No pertinent family history.  Social History Social History  Substance Use Topics  . Smoking status: Never Smoker   . Smokeless tobacco: None  . Alcohol Use: No    Review of Systems Constitutional: No fever/chills Eyes: No visual changes. ENT: No sore throat. Cardiovascular: chest pain. Respiratory:  shortness of breath. Gastrointestinal: Vomiting with No abdominal pain.  No nausea,  No diarrhea.  No  constipation. Genitourinary: Negative for dysuria. Musculoskeletal: Negative for back pain. Skin: Negative for rash. Neurological: Headache  10-point ROS otherwise negative.  ____________________________________________   PHYSICAL EXAM:  VITAL SIGNS: ED Triage Vitals  Enc Vitals Group     BP 03/11/15 0029 143/92 mmHg     Pulse Rate 03/11/15 0029 74     Resp 03/11/15 0029 18     Temp 03/11/15 0029 98.4 F (36.9 C)     Temp Source 03/11/15 0029 Oral     SpO2 03/11/15 0029 96 %     Weight 03/11/15 0029 185 lb (83.915 kg)     Height 03/11/15 0029 5\' 7"  (1.702 m)     Head Cir --      Peak Flow --      Pain Score 03/11/15 0030 9     Pain Loc --      Pain Edu? --      Excl. in [place]? --     Constitutional: Alert and oriented. Ill appearing and in moderate distress. Eyes: Conjunctivae are normal. PERRL. EOMI. Head: Atraumatic. Nose: No congestion/rhinnorhea. Mouth/Throat: Mucous membranes are moist.  Oropharynx non-erythematous. Cardiovascular: Normal rate, regular rhythm. Grossly normal heart sounds.  Good peripheral circulation. Respiratory: Normal respiratory effort.  No retractions. Lungs CTAB. Some tenderness to palpation along the lower sternum Gastrointestinal: Soft and nontender. No distention. Positive bowel sounds Musculoskeletal: No lower extremity tenderness nor edema.  Neurologic:  Normal speech and language.  Skin:  Skin is warm, dry and intact. No rash noted. Psychiatric: Mood and affect are normal.   ____________________________________________  LABS (all labs ordered are listed, but only abnormal results are displayed)  Labs Reviewed  CBC - Abnormal; Notable for the following:    WBC 12.8 (*)    RDW 15.9 (*)    Platelets 130 (*)    All other components within normal limits  COMPREHENSIVE METABOLIC PANEL - Abnormal; Notable for the following:    Potassium 3.4 (*)    Glucose, Bld 132 (*)    Calcium 8.8 (*)    All other components within normal limits   LIPASE, BLOOD - Abnormal; Notable for the following:    Lipase 20 (*)    All other components within normal limits  TROPONIN I  TROPONIN I   ____________________________________________  EKG  ED ECG REPORT I, Loney Hering, the attending physician, personally viewed and interpreted this ECG.   Date: 03/11/2015  EKG Time: 0023  Rate: 84  Rhythm: normal sinus rhythm  Axis: Normal  Intervals:right bundle branch block  ST&T Change: Flipped T waves in leads V1 and V2 V3 with some S waves in leads 1, 2 and aVF.  ____________________________________________  RADIOLOGY  CXR: Mild cardiac enlargement, no evidence of active pulmonary disease ____________________________________________   PROCEDURES  Procedure(s) performed: None  Critical Care performed: No  ____________________________________________   INITIAL IMPRESSION / ASSESSMENT AND PLAN / ED COURSE  Pertinent labs & imaging results that were available during my care of the patient were reviewed by me and considered in my medical decision making (see chart for details).  The patient is a 59 year old female who comes in today with chest pain. I will put a Nitropaste to her chest do an x-ray and reassess her once the blood work is returned. The patient received a Nitropaste to her chest as well as Zofran and morphine 2 mg IV 1. The patient reports her pain is improved. She reports though that she does have some pain with breathing now that the constant pain is improved. I informed the patient that I could further evaluate her chest pain with a CT scan to evaluate for possible clots. The patient reports that she is ready to go home and she has an appointment with her cardiologist this week. Patient reports though that her pain is currently improved and she feels well enough to be discharged home. I did inform the patient of the risks and she accepts those risks be discharged home. The patient return should anything  worsen. The patient will be discharged home to follow-up with her cardiologist in 2-3 days. ____________________________________________   FINAL CLINICAL IMPRESSION(S) / ED DIAGNOSES  Final diagnoses:  Chest pain, unspecified chest pain type      Loney Hering, MD 03/11/15 [phone]

## 2015-03-11 NOTE — ED Notes (Signed)
Pt arrives via EMS from home with c/o midsternal CP.  Pt sitting and watching tv when pain started.  Pt describes pain as pressure and rated 9/10.  Pt rec'd 324 mg ASA PTA.  Pt took 2 SL NTG and rec'd 1 NTG enroute by EMS.  Pt with significant cardiac hx.  Dr. Dahlia Client a beside upon patient's arrival to room 19.

## 2015-03-11 NOTE — ED Notes (Signed)
Patient with no complaints at this time. Respirations even and unlabored. Skin warm/dry. Discharge instructions reviewed with patient at this time. Patient given opportunity to voice concerns/ask questions. IV removed per policy and band-aid applied to site. Patient discharged at this time and left Emergency Department with steady gait.  

## 2015-03-17 DIAGNOSIS — I10 Essential (primary) hypertension: Secondary | ICD-10-CM | POA: Diagnosis not present

## 2015-03-17 DIAGNOSIS — I25810 Atherosclerosis of coronary artery bypass graft(s) without angina pectoris: Secondary | ICD-10-CM | POA: Diagnosis not present

## 2015-03-17 DIAGNOSIS — I5032 Chronic diastolic (congestive) heart failure: Secondary | ICD-10-CM | POA: Diagnosis not present

## 2015-03-17 DIAGNOSIS — E782 Mixed hyperlipidemia: Secondary | ICD-10-CM | POA: Diagnosis not present

## 2015-03-20 ENCOUNTER — Other Ambulatory Visit: Payer: Self-pay | Admitting: Neurology

## 2015-03-20 DIAGNOSIS — R51 Headache: Secondary | ICD-10-CM | POA: Diagnosis not present

## 2015-03-20 DIAGNOSIS — R519 Headache, unspecified: Secondary | ICD-10-CM

## 2015-03-26 DIAGNOSIS — I25810 Atherosclerosis of coronary artery bypass graft(s) without angina pectoris: Secondary | ICD-10-CM | POA: Diagnosis not present

## 2015-03-26 DIAGNOSIS — I5032 Chronic diastolic (congestive) heart failure: Secondary | ICD-10-CM | POA: Diagnosis not present

## 2015-03-26 DIAGNOSIS — I10 Essential (primary) hypertension: Secondary | ICD-10-CM | POA: Diagnosis not present

## 2015-03-26 DIAGNOSIS — E782 Mixed hyperlipidemia: Secondary | ICD-10-CM | POA: Diagnosis not present

## 2015-04-01 ENCOUNTER — Ambulatory Visit: Admission: RE | Admit: 2015-04-01 | Payer: Medicare Other | Source: Ambulatory Visit

## 2015-04-02 ENCOUNTER — Ambulatory Visit
Admission: RE | Admit: 2015-04-02 | Discharge: 2015-04-02 | Disposition: A | Discharge status: Home / Self Care | Payer: Medicare Other | Source: Ambulatory Visit | Attending: Neurology | Admitting: Neurology

## 2015-04-02 DIAGNOSIS — R519 Headache, unspecified: Secondary | ICD-10-CM

## 2015-04-02 DIAGNOSIS — R1110 Vomiting, unspecified: Secondary | ICD-10-CM | POA: Diagnosis not present

## 2015-04-02 DIAGNOSIS — R51 Headache: Secondary | ICD-10-CM | POA: Insufficient documentation

## 2015-04-02 DIAGNOSIS — R42 Dizziness and giddiness: Secondary | ICD-10-CM | POA: Diagnosis not present

## 2015-04-03 ENCOUNTER — Telehealth: Payer: Self-pay | Admitting: Dietician

## 2015-04-03 NOTE — Telephone Encounter (Signed)
Spoke with patient, she states she is doing well, but cannot come in for a weight check at this time due to multiple other appointments. She states she will call back when she can return.

## 2015-04-10 DIAGNOSIS — E876 Hypokalemia: Secondary | ICD-10-CM | POA: Diagnosis not present

## 2015-04-10 DIAGNOSIS — J4540 Moderate persistent asthma, uncomplicated: Secondary | ICD-10-CM | POA: Diagnosis not present

## 2015-04-10 DIAGNOSIS — I2584 Coronary atherosclerosis due to calcified coronary lesion: Secondary | ICD-10-CM | POA: Diagnosis not present

## 2015-04-10 DIAGNOSIS — E119 Type 2 diabetes mellitus without complications: Secondary | ICD-10-CM | POA: Diagnosis not present

## 2015-04-10 DIAGNOSIS — I10 Essential (primary) hypertension: Secondary | ICD-10-CM | POA: Diagnosis not present

## 2015-04-16 DIAGNOSIS — G43719 Chronic migraine without aura, intractable, without status migrainosus: Secondary | ICD-10-CM | POA: Diagnosis not present

## 2015-04-24 DIAGNOSIS — G43719 Chronic migraine without aura, intractable, without status migrainosus: Secondary | ICD-10-CM | POA: Diagnosis not present

## 2015-04-28 DIAGNOSIS — Z23 Encounter for immunization: Secondary | ICD-10-CM | POA: Diagnosis not present

## 2015-05-01 DIAGNOSIS — G43719 Chronic migraine without aura, intractable, without status migrainosus: Secondary | ICD-10-CM | POA: Diagnosis not present

## 2015-05-08 DIAGNOSIS — B351 Tinea unguium: Secondary | ICD-10-CM | POA: Diagnosis not present

## 2015-05-08 DIAGNOSIS — L851 Acquired keratosis [keratoderma] palmaris et plantaris: Secondary | ICD-10-CM | POA: Diagnosis not present

## 2015-05-08 DIAGNOSIS — E1140 Type 2 diabetes mellitus with diabetic neuropathy, unspecified: Secondary | ICD-10-CM | POA: Diagnosis not present

## 2015-05-08 DIAGNOSIS — G43719 Chronic migraine without aura, intractable, without status migrainosus: Secondary | ICD-10-CM | POA: Insufficient documentation

## 2015-05-23 ENCOUNTER — Encounter: Payer: Self-pay | Admitting: Dietician

## 2015-05-23 NOTE — Progress Notes (Signed)
Have not heard from patient to schedule weight check or further follow-up. Sent discharge letter to MD.

## 2015-05-28 ENCOUNTER — Encounter: Payer: Self-pay | Admitting: Emergency Medicine

## 2015-05-28 ENCOUNTER — Emergency Department: Payer: Medicare Other

## 2015-05-28 ENCOUNTER — Emergency Department
Admission: EM | Admit: 2015-05-28 | Discharge: 2015-05-28 | Disposition: A | Discharge status: Home / Self Care | Payer: Medicare Other | Attending: Emergency Medicine | Admitting: Emergency Medicine

## 2015-05-28 DIAGNOSIS — Z880 Allergy status to penicillin: Secondary | ICD-10-CM | POA: Diagnosis not present

## 2015-05-28 DIAGNOSIS — I10 Essential (primary) hypertension: Secondary | ICD-10-CM | POA: Diagnosis not present

## 2015-05-28 DIAGNOSIS — J40 Bronchitis, not specified as acute or chronic: Secondary | ICD-10-CM

## 2015-05-28 DIAGNOSIS — E119 Type 2 diabetes mellitus without complications: Secondary | ICD-10-CM | POA: Diagnosis not present

## 2015-05-28 DIAGNOSIS — Z7951 Long term (current) use of inhaled steroids: Secondary | ICD-10-CM | POA: Insufficient documentation

## 2015-05-28 DIAGNOSIS — Z79899 Other long term (current) drug therapy: Secondary | ICD-10-CM | POA: Insufficient documentation

## 2015-05-28 DIAGNOSIS — R0789 Other chest pain: Secondary | ICD-10-CM | POA: Diagnosis not present

## 2015-05-28 DIAGNOSIS — R079 Chest pain, unspecified: Secondary | ICD-10-CM | POA: Diagnosis not present

## 2015-05-28 HISTORY — DX: Essential (primary) hypertension: I10

## 2015-05-28 HISTORY — DX: Type 2 diabetes mellitus without complications: E11.9

## 2015-05-28 LAB — BASIC METABOLIC PANEL
Anion gap: 8 (ref 5–15)
BUN: 15 mg/dL (ref 6–20)
CALCIUM: 9.8 mg/dL (ref 8.9–10.3)
CO2: 27 mmol/L (ref 22–32)
CREATININE: 0.73 mg/dL (ref 0.44–1.00)
Chloride: 104 mmol/L (ref 101–111)
GFR calc Af Amer: >60 mL/min (ref >60)
GFR calc non Af Amer: >60 mL/min (ref >60)
GLUCOSE: 110 mg/dL — AB (ref 65–99)
Potassium: 4.1 mmol/L (ref 3.5–5.1)
Sodium: 139 mmol/L (ref 135–145)

## 2015-05-28 LAB — CBC
HCT: 39.5 % (ref 35.0–47.0)
HEMOGLOBIN: 12.9 g/dL (ref 12.0–16.0)
MCH: 25.5 pg — AB (ref 26.0–34.0)
MCHC: 32.6 g/dL (ref 32.0–36.0)
MCV: 78.4 fL — ABNORMAL LOW (ref 80.0–100.0)
PLATELETS: 192 10*3/uL (ref 150–440)
RBC: 5.04 MIL/uL (ref 3.80–5.20)
RDW: 16.7 % — ABNORMAL HIGH (ref 11.5–14.5)
WBC: 11.6 10*3/uL — ABNORMAL HIGH (ref 3.6–11.0)

## 2015-05-28 LAB — TROPONIN I

## 2015-05-28 MED ORDER — PREDNISONE 20 MG PO TABS
60.0000 mg | ORAL_TABLET | Freq: Once | ORAL | Status: AC
  Administered 2015-05-28: 60 mg via ORAL

## 2015-05-28 MED ORDER — IPRATROPIUM-ALBUTEROL 0.5-2.5 (3) MG/3ML IN SOLN
3.0000 mL | Freq: Once | RESPIRATORY_TRACT | Status: AC
  Administered 2015-05-28: 3 mL via RESPIRATORY_TRACT
  Filled 2015-05-28: qty 3

## 2015-05-28 MED ORDER — PREDNISONE 10 MG PO TABS: 60.0000 mg | ORAL_TABLET | Freq: Every day | ORAL | Status: DC

## 2015-05-28 MED ORDER — PREDNISONE 20 MG PO TABS
ORAL_TABLET | ORAL | Status: AC
  Administered 2015-05-28: 60 mg via ORAL
  Filled 2015-05-28: qty 3

## 2015-05-28 MED ORDER — IPRATROPIUM-ALBUTEROL 0.5-2.5 (3) MG/3ML IN SOLN
3.0000 mL | Freq: Once | RESPIRATORY_TRACT | Status: AC
  Administered 2015-05-28: 3 mL via RESPIRATORY_TRACT

## 2015-05-28 NOTE — ED Provider Notes (Signed)
[facility] Emergency Department Provider Note   ____________________________________________  Time seen:  I have reviewed the triage vital signs and the triage nursing note.  HISTORY  Chief Complaint Chest Pain   Historian Patient and spouse  HPI Felicia Woods is a 59 y.o. female with a history of asthma is here for evaluation of chest tightness and wheezing which started this afternoon around 4 PM. Patient has had an MI in the past and states this feels differently. No nausea, no syncope. She's had a mild nonproductive cough. She is having pain gets worse with breathing and goes through to her back. No fever.    Past Medical History  Diagnosis Date  . Hypertension   . Diabetes mellitus without complication ([place])     There are no active problems to display for this patient.   Past Surgical History  Procedure Laterality Date  . Cardiac surgery    . Abdominal hysterectomy    . Cholecystectomy      Current Outpatient Rx  Name  Route  Sig  Dispense  Refill  . amLODipine (NORVASC) 5 MG tablet               . augmented betamethasone dipropionate (DIPROLENE-AF) 0.05 % cream               . bisoprolol-hydrochlorothiazide (ZIAC) 2.5-6.25 MG per tablet               . BRISDELLE 7.5 MG CAPS                 Dispense as written.   . budesonide-formoterol (SYMBICORT) 160-4.5 MCG/ACT inhaler   Inhalation   Inhale into the lungs.         . cyclobenzaprine (FLEXERIL) 10 MG tablet               . Diclofenac Sodium 1.5 % SOLN               . estradiol (ESTRACE) 0.5 MG tablet               . fluticasone (FLONASE) 50 MCG/ACT nasal spray               . furosemide (LASIX) 40 MG tablet               . glimepiride (AMARYL) 2 MG tablet   Oral   Take by mouth.         . hydrOXYzine (ATARAX/VISTARIL) 25 MG tablet   Oral   Take by mouth.         . losartan (COZAAR) 50 MG tablet   Oral   Take by mouth.          . metFORMIN (GLUCOPHAGE) 500 MG tablet   Oral   Take by mouth.         . metoprolol (LOPRESSOR) 50 MG tablet   Oral   Take by mouth.         . montelukast (SINGULAIR) 10 MG tablet               . nitroGLYCERIN (NITROSTAT) 0.4 MG SL tablet   Sublingual   Place under the tongue.         . nortriptyline (PAMELOR) 10 MG capsule               . nystatin cream (MYCOSTATIN)               . predniSONE (DELTASONE) 10 MG tablet  Oral   Take 6 tablets (60 mg total) by mouth daily. 60 mg once daily for 4 more days   24 tablet   0   . raloxifene (EVISTA) 60 MG tablet               . ranitidine (ZANTAC) 150 MG tablet               . sertraline (ZOLOFT) 100 MG tablet               . simvastatin (ZOCOR) 40 MG tablet               . VENTOLIN HFA 108 (90 BASE) MCG/ACT inhaler                 Dispense as written.   . VESICARE 10 MG tablet                 Dispense as written.   . zolpidem (AMBIEN) 5 MG tablet                 Allergies Penicillin g  No family history on file.  Social History Social History  Substance Use Topics  . Smoking status: Never Smoker   . Smokeless tobacco: None  . Alcohol Use: No    Review of Systems  Constitutional: Negative for fever. Eyes: Negative for visual changes. ENT: Negative for sore throat. Cardiovascular: Positive for chest pain and pressure.Marland Kitchen Respiratory: Positive for shortness of breath. Gastrointestinal: Negative for abdominal pain, vomiting and diarrhea. Genitourinary: Negative for dysuria. Musculoskeletal: Negative for back pain. Skin: Negative for rash. Neurological: Negative for headache. 10 point Review of Systems otherwise negative ____________________________________________   PHYSICAL EXAM:  VITAL SIGNS: ED Triage Vitals  Enc Vitals Group     BP 05/28/15 1941 171/90 mmHg     Pulse Rate 05/28/15 1941 83     Resp 05/28/15 1941 22     Temp 05/28/15 1941  98.1 F (36.7 C)     Temp Source 05/28/15 1941 Oral     SpO2 05/28/15 1941 97 %     Weight 05/28/15 1941 205 lb (92.987 kg)     Height 05/28/15 1941 5\' 7"  (1.702 m)     Head Cir --      Peak Flow --      Pain Score 05/28/15 1940 7     Pain Loc --      Pain Edu? --      Excl. in [place]? --      Constitutional: Alert and oriented. Well appearing and in no distress. Eyes: Conjunctivae are normal. PERRL. Normal extraocular movements. ENT   Head: Normocephalic and atraumatic.   Nose: No congestion/rhinnorhea.   Mouth/Throat: Mucous membranes are moist.   Neck: No stridor. Cardiovascular/Chest: Normal rate, regular rhythm.  No murmurs, rubs, or gallops. Respiratory: Normal respiratory effort without tachypnea nor retractions. Decreased air movement throughout all fields. With forced exhalation, patient has significant wheezing and coughing. Gastrointestinal: Soft. No distention, no guarding, no rebound. Nontender   Genitourinary/rectal:Deferred Musculoskeletal: Nontender with normal range of motion in all extremities. No joint effusions.  No lower extremity tenderness.  No edema. Neurologic:  Normal speech and language. No gross or focal neurologic deficits are appreciated. Skin:  Skin is warm, dry and intact. No rash noted. Psychiatric: Mood and affect are normal. Speech and behavior are normal. Patient exhibits appropriate insight and judgment.  ____________________________________________   EKG I, Lisa Roca, MD, the attending physician have personally viewed  and interpreted all ECGs.  70 bpm. Normal sinus rhythm. Right bundle branch block. Normal axis. T-wave inversions inferiorly anterior and laterally. ____________________________________________  LABS (pertinent positives/negatives)  Basic blood panel within normal limits White blood cell count 11.6, hemoglobin 12.9 and platelet count 192 Troponin less than  0.03  ____________________________________________  RADIOLOGY All Xrays were viewed by me. Imaging interpreted by Radiologist.  Chest x-ray two-view: No edema or consolidation __________________________________________  PROCEDURES  Procedure(s) performed: None  Critical Care performed: None  ____________________________________________   ED COURSE / ASSESSMENT AND PLAN  CONSULTATIONS: None  Pertinent labs & imaging results that were available during my care of the patient were reviewed by me and considered in my medical decision making (see chart for details).   Clinically the patient's symptoms seem to be due to bronchospasm/asthma possibly from acute bronchitis. Patient treated with DuoNeb with improvement. In terms of the chest discomfort, I suspect this is due to tenderness of the airways, rather than acute cardiac syndrome. Her chest discomfort started at 4, and her blood work was drawn at 8 and showed a negative troponin.  Patient states that she is having chest pain but that it feels like asthma/bronchitis, not something different.  I will treat the bronchitis/asthma exacerbation with prednisone burst as well as every 4 hour albuterol. Patient is stable for outpatient therapy.  95% O2 sat with walking. Improved after DuoNeb treatment here in the emergency department.  Patient / Family / Caregiver informed of clinical course, medical decision-making process, and agree with plan.   I discussed return precautions, follow-up instructions, and discharged instructions with patient and/or family.  ___________________________________________   FINAL CLINICAL IMPRESSION(S) / ED DIAGNOSES   Final diagnoses:  Bronchitis       Lisa Roca, MD 05/28/15 2115

## 2015-05-28 NOTE — Discharge Instructions (Signed)
You were evaluated for shortness of breath and wheezing and found to have bronchospasm/bronchitis. Return to the emergency department for any worsening condition including trouble breathing, fever, chest pain, nausea, sweating, passing out, or any other symptoms concerning to you.  Continue albuterol inhaler 2 puffs every 4 hours as needed for shortness of breath/wheezing.    Acute Bronchitis Bronchitis is inflammation of the airways that extend from the windpipe into the lungs (bronchi). The inflammation often causes mucus to develop. This leads to a cough, which is the most common symptom of bronchitis.  In acute bronchitis, the condition usually develops suddenly and goes away over time, usually in a couple weeks. Smoking, allergies, and asthma can make bronchitis worse. Repeated episodes of bronchitis may cause further lung problems.  CAUSES Acute bronchitis is most often caused by the same virus that causes a cold. The virus can spread from person to person (contagious) through coughing, sneezing, and touching contaminated objects. SIGNS AND SYMPTOMS   Cough.   Fever.   Coughing up mucus.   Body aches.   Chest congestion.   Chills.   Shortness of breath.   Sore throat.  DIAGNOSIS  Acute bronchitis is usually diagnosed through a physical exam. Your health care provider will also ask you questions about your medical history. Tests, such as chest X-rays, are sometimes done to rule out other conditions.  TREATMENT  Acute bronchitis usually goes away in a couple weeks. Oftentimes, no medical treatment is necessary. Medicines are sometimes given for relief of fever or cough. Antibiotic medicines are usually not needed but may be prescribed in certain situations. In some cases, an inhaler may be recommended to help reduce shortness of breath and control the cough. A cool mist vaporizer may also be used to help thin bronchial secretions and make it easier to clear the chest.    HOME CARE INSTRUCTIONS  Get plenty of rest.   Drink enough fluids to keep your urine clear or pale yellow (unless you have a medical condition that requires fluid restriction). Increasing fluids may help thin your respiratory secretions (sputum) and reduce chest congestion, and it will prevent dehydration.   Take medicines only as directed by your health care provider.  If you were prescribed an antibiotic medicine, finish it all even if you start to feel better.  Avoid smoking and secondhand smoke. Exposure to cigarette smoke or irritating chemicals will make bronchitis worse. If you are a smoker, consider using nicotine gum or skin patches to help control withdrawal symptoms. Quitting smoking will help your lungs heal faster.   Reduce the chances of another bout of acute bronchitis by washing your hands frequently, avoiding people with cold symptoms, and trying not to touch your hands to your mouth, nose, or eyes.   Keep all follow-up visits as directed by your health care provider.  SEEK MEDICAL CARE IF: Your symptoms do not improve after 1 week of treatment.  SEEK IMMEDIATE MEDICAL CARE IF:  You develop an increased fever or chills.   You have chest pain.   You have severe shortness of breath.  You have bloody sputum.   You develop dehydration.  You faint or repeatedly feel like you are going to pass out.  You develop repeated vomiting.  You develop a severe headache. MAKE SURE YOU:   Understand these instructions.  Will watch your condition.  Will get help right away if you are not doing well or get worse.   This information is not intended to replace  advice given to you by your health care provider. Make sure you discuss any questions you have with your health care provider.   Document Released: 07/29/2004 Document Revised: 07/12/2014 Document Reviewed: 12/12/2012 Elsevier Interactive Patient Education [employer].

## 2015-05-28 NOTE — ED Notes (Signed)
Pt was able to ambulate with no assistance and oxygen level stayed above 95% on RA the entire time. Pt in NAD at this time, will cont to monitor pt.

## 2015-05-28 NOTE — ED Notes (Signed)
Patient ambulatory to triage with steady gait, without difficulty or distress noted; pt reports having mid CP today radiating into back that increases with deep breathing; denies accomp symptoms; st hx MI

## 2015-05-28 NOTE — ED Notes (Signed)
Pt states that she started to have pain around 4pm this afternoon, pt states that she has also had cough and congestion.  When pt states if she takes a deep breath she starts to cough and having wheezing. Pt in NAD at this time, was placed on all monitor and pt in NAD at this time.

## 2015-06-12 ENCOUNTER — Encounter: Payer: Self-pay | Admitting: Urgent Care

## 2015-06-12 ENCOUNTER — Emergency Department
Admission: EM | Admit: 2015-06-12 | Discharge: 2015-06-12 | Disposition: A | Discharge status: Home / Self Care | Payer: Medicare Other | Attending: Emergency Medicine | Admitting: Emergency Medicine

## 2015-06-12 ENCOUNTER — Emergency Department: Payer: Medicare Other

## 2015-06-12 DIAGNOSIS — R0602 Shortness of breath: Secondary | ICD-10-CM | POA: Diagnosis not present

## 2015-06-12 DIAGNOSIS — M549 Dorsalgia, unspecified: Secondary | ICD-10-CM | POA: Diagnosis not present

## 2015-06-12 DIAGNOSIS — M7918 Myalgia, other site: Secondary | ICD-10-CM

## 2015-06-12 DIAGNOSIS — E119 Type 2 diabetes mellitus without complications: Secondary | ICD-10-CM | POA: Insufficient documentation

## 2015-06-12 DIAGNOSIS — I10 Essential (primary) hypertension: Secondary | ICD-10-CM | POA: Insufficient documentation

## 2015-06-12 DIAGNOSIS — R079 Chest pain, unspecified: Secondary | ICD-10-CM | POA: Diagnosis not present

## 2015-06-12 DIAGNOSIS — Z880 Allergy status to penicillin: Secondary | ICD-10-CM | POA: Insufficient documentation

## 2015-06-12 DIAGNOSIS — M791 Myalgia: Secondary | ICD-10-CM | POA: Diagnosis not present

## 2015-06-12 LAB — BASIC METABOLIC PANEL
ANION GAP: 6 (ref 5–15)
BUN: 11 mg/dL (ref 6–20)
CHLORIDE: 105 mmol/L (ref 101–111)
CO2: 29 mmol/L (ref 22–32)
CREATININE: 0.64 mg/dL (ref 0.44–1.00)
Calcium: 9.3 mg/dL (ref 8.9–10.3)
GFR calc non Af Amer: >60 mL/min (ref >60)
Glucose, Bld: 133 mg/dL — ABNORMAL HIGH (ref 65–99)
Potassium: 3.4 mmol/L — ABNORMAL LOW (ref 3.5–5.1)
SODIUM: 140 mmol/L (ref 135–145)

## 2015-06-12 LAB — CBC
HCT: 39.1 % (ref 35.0–47.0)
HEMOGLOBIN: 12.8 g/dL (ref 12.0–16.0)
MCH: 25.4 pg — AB (ref 26.0–34.0)
MCHC: 32.7 g/dL (ref 32.0–36.0)
MCV: 77.8 fL — AB (ref 80.0–100.0)
PLATELETS: 211 10*3/uL (ref 150–440)
RBC: 5.02 MIL/uL (ref 3.80–5.20)
RDW: 16.6 % — ABNORMAL HIGH (ref 11.5–14.5)
WBC: 11.2 10*3/uL — AB (ref 3.6–11.0)

## 2015-06-12 LAB — TROPONIN I

## 2015-06-12 MED ORDER — OXYCODONE-ACETAMINOPHEN 5-325 MG PO TABS
2.0000 | ORAL_TABLET | Freq: Once | ORAL | Status: AC
  Administered 2015-06-12: 2 via ORAL
  Filled 2015-06-12: qty 2

## 2015-06-12 MED ORDER — MORPHINE SULFATE (PF) 4 MG/ML IV SOLN
4.0000 mg | Freq: Once | INTRAVENOUS | Status: AC
  Administered 2015-06-12: 4 mg via INTRAVENOUS
  Filled 2015-06-12: qty 1

## 2015-06-12 MED ORDER — ONDANSETRON HCL 4 MG/2ML IJ SOLN
4.0000 mg | Freq: Once | INTRAMUSCULAR | Status: AC
  Administered 2015-06-12: 4 mg via INTRAVENOUS
  Filled 2015-06-12: qty 2

## 2015-06-12 MED ORDER — OXYCODONE-ACETAMINOPHEN 5-325 MG PO TABS: 1.0000 | ORAL_TABLET | Freq: Four times a day (QID) | ORAL | Status: DC | PRN

## 2015-06-12 NOTE — ED Notes (Signed)
Patient presents with retrosternal CP with (+) radiation into LEFT chest and LUE. (+) SOB and diaphoresis reported. Patient is s/p 4 vessel CABG in 2010.

## 2015-06-12 NOTE — ED Notes (Signed)
MD at bedside. 

## 2015-06-12 NOTE — ED Notes (Signed)
Transported to X-Ray °

## 2015-06-12 NOTE — ED Notes (Signed)
Patients husband will be driving her home. 

## 2015-06-12 NOTE — Discharge Instructions (Signed)
You have been seen in the emergency department today for chest pain. Your workup has shown normal results. As we discussed please follow-up with your primary care physician in the next 1-2 days for recheck. Return to the emergency department for any further chest pain, trouble breathing, or any other symptom personally concerning to yourself.   Musculoskeletal Pain Musculoskeletal pain is muscle and boney aches and pains. These pains can occur in any part of the body. Your caregiver may treat you without knowing the cause of the pain. They may treat you if blood or urine tests, X-rays, and other tests were normal.  CAUSES There is often not a definite cause or reason for these pains. These pains may be caused by a type of germ (virus). The discomfort may also come from overuse. Overuse includes working out too hard when your body is not fit. Boney aches also come from weather changes. Bone is sensitive to atmospheric pressure changes. HOME CARE INSTRUCTIONS   Ask when your test results will be ready. Make sure you get your test results.  Only take over-the-counter or prescription medicines for pain, discomfort, or fever as directed by your caregiver. If you were given medications for your condition, do not drive, operate machinery or power tools, or sign legal documents for 24 hours. Do not drink alcohol. Do not take sleeping pills or other medications that may interfere with treatment.  Continue all activities unless the activities cause more pain. When the pain lessens, slowly resume normal activities. Gradually increase the intensity and duration of the activities or exercise.  During periods of severe pain, bed rest may be helpful. Lay or sit in any position that is comfortable.  Putting ice on the injured area.  Put ice in a bag.  Place a towel between your skin and the bag.  Leave the ice on for 15 to 20 minutes, 3 to 4 times a day.  Follow up with your caregiver for continued problems  and no reason can be found for the pain. If the pain becomes worse or does not go away, it may be necessary to repeat tests or do additional testing. Your caregiver may need to look further for a possible cause. SEEK IMMEDIATE MEDICAL CARE IF:  You have pain that is getting worse and is not relieved by medications.  You develop chest pain that is associated with shortness or breath, sweating, feeling sick to your stomach (nauseous), or throw up (vomit).  Your pain becomes localized to the abdomen.  You develop any new symptoms that seem different or that concern you. MAKE SURE YOU:   Understand these instructions.  Will watch your condition.  Will get help right away if you are not doing well or get worse.   This information is not intended to replace advice given to you by your health care provider. Make sure you discuss any questions you have with your health care provider.   Document Released: 06/21/2005 Document Revised: 09/13/2011 Document Reviewed: 02/23/2013 Elsevier Interactive Patient Education 2016 [employer]  Nonspecific Chest Pain  Chest pain can be caused by many different conditions. There is always a chance that your pain could be related to something serious, such as a heart attack or a blood clot in your lungs. Chest pain can also be caused by conditions that are not life-threatening. If you have chest pain, it is very important to follow up with your health care provider. CAUSES  Chest pain can be caused by:  Heartburn.  Pneumonia  or bronchitis.  Anxiety or stress.  Inflammation around your heart (pericarditis) or lung (pleuritis or pleurisy).  A blood clot in your lung.  A collapsed lung (pneumothorax). It can develop suddenly on its own (spontaneous pneumothorax) or from trauma to the chest.  Shingles infection (varicella-zoster virus).  Heart attack.  Damage to the bones, muscles, and cartilage that make up your chest wall. This can  include:  Bruised bones due to injury.  Strained muscles or cartilage due to frequent or repeated coughing or overwork.  Fracture to one or more ribs.  Sore cartilage due to inflammation (costochondritis). RISK FACTORS  Risk factors for chest pain may include:  Activities that increase your risk for trauma or injury to your chest.  Respiratory infections or conditions that cause frequent coughing.  Medical conditions or overeating that can cause heartburn.  Heart disease or family history of heart disease.  Conditions or health behaviors that increase your risk of developing a blood clot.  Having had chicken pox (varicella zoster). SIGNS AND SYMPTOMS Chest pain can feel like:  Burning or tingling on the surface of your chest or deep in your chest.  Crushing, pressure, aching, or squeezing pain.  Dull or sharp pain that is worse when you move, cough, or take a deep breath.  Pain that is also felt in your back, neck, shoulder, or arm, or pain that spreads to any of these areas. Your chest pain may come and go, or it may stay constant. DIAGNOSIS Lab tests or other studies may be needed to find the cause of your pain. Your health care provider may have you take a test called an ambulatory ECG (electrocardiogram). An ECG records your heartbeat patterns at the time the test is performed. You may also have other tests, such as:  Transthoracic echocardiogram (TTE). During echocardiography, sound waves are used to create a picture of all of the heart structures and to look at how blood flows through your heart.  Transesophageal echocardiogram (TEE).This is a more advanced imaging test that obtains images from inside your body. It allows your health care provider to see your heart in finer detail.  Cardiac monitoring. This allows your health care provider to monitor your heart rate and rhythm in real time.  Holter monitor. This is a portable device that records your heartbeat and  can help to diagnose abnormal heartbeats. It allows your health care provider to track your heart activity for several days, if needed.  Stress tests. These can be done through exercise or by taking medicine that makes your heart beat more quickly.  Blood tests.  Imaging tests. TREATMENT  Your treatment depends on what is causing your chest pain. Treatment may include:  Medicines. These may include:  Acid blockers for heartburn.  Anti-inflammatory medicine.  Pain medicine for inflammatory conditions.  Antibiotic medicine, if an infection is present.  Medicines to dissolve blood clots.  Medicines to treat coronary artery disease.  Supportive care for conditions that do not require medicines. This may include:  Resting.  Applying heat or cold packs to injured areas.  Limiting activities until pain decreases. HOME CARE INSTRUCTIONS  If you were prescribed an antibiotic medicine, finish it all even if you start to feel better.  Avoid any activities that bring on chest pain.  Do not use any tobacco products, including cigarettes, chewing tobacco, or electronic cigarettes. If you need help quitting, ask your health care provider.  Do not drink alcohol.  Take medicines only as directed by your  health care provider.  Keep all follow-up visits as directed by your health care provider. This is important. This includes any further testing if your chest pain does not go away.  If heartburn is the cause for your chest pain, you may be told to keep your head raised (elevated) while sleeping. This reduces the chance that acid will go from your stomach into your esophagus.  Make lifestyle changes as directed by your health care provider. These may include:  Getting regular exercise. Ask your health care provider to suggest some activities that are safe for you.  Eating a heart-healthy diet. A registered dietitian can help you to learn healthy eating options.  Maintaining a  healthy weight.  Managing diabetes, if necessary.  Reducing stress. SEEK MEDICAL CARE IF:  Your chest pain does not go away after treatment.  You have a rash with blisters on your chest.  You have a fever. SEEK IMMEDIATE MEDICAL CARE IF:   Your chest pain is worse.  You have an increasing cough, or you cough up blood.  You have severe abdominal pain.  You have severe weakness.  You faint.  You have chills.  You have sudden, unexplained chest discomfort.  You have sudden, unexplained discomfort in your arms, back, neck, or jaw.  You have shortness of breath at any time.  You suddenly start to sweat, or your skin gets clammy.  You feel nauseous or you vomit.  You suddenly feel light-headed or dizzy.  Your heart begins to beat quickly, or it feels like it is skipping beats. These symptoms may represent a serious problem that is an emergency. Do not wait to see if the symptoms will go away. Get medical help right away. Call your local emergency services (911 in the [place] Do not drive yourself to the hospital.   This information is not intended to replace advice given to you by your health care provider. Make sure you discuss any questions you have with your health care provider.   Document Released: 03/31/2005 Document Revised: 07/12/2014 Document Reviewed: 01/25/2014 Elsevier Interactive Patient Education [employer].

## 2015-06-12 NOTE — ED Provider Notes (Signed)
[facility] Emergency Department Provider Note  Time seen: 9:43 PM  I have reviewed the triage vital signs and the nursing notes.   HISTORY  Chief Complaint Chest Pain    HPI Felicia Woods is a 59 y.o. female with a past medical history of hypertension, hyperlipidemia, diabetes, CAD status post CABG, who presents the emergency department with left-sided chest pain. According to the patient she was at the grocery store 6 hours ago when she bent over to pick up a case of water. She states upon standing up she had immediate pain to her left back/left chest. States the pain has not resolved so she came to the emergency department for evaluation. Denies shortness of breath, denies any worsening of pain with deep inspiration. Denies recent cough or congestion. Denies diaphoresis or nausea at this time.States the pain is moderate to severe currently. Worse with movement.     Past Medical History  Diagnosis Date  . Hypertension   . Diabetes mellitus without complication ([place])     There are no active problems to display for this patient.   Past Surgical History  Procedure Laterality Date  . Cardiac surgery    . Abdominal hysterectomy    . Cholecystectomy    . Coronary artery bypass graft      4 vessels - 2010    Current Outpatient Rx  Name  Route  Sig  Dispense  Refill  . amLODipine (NORVASC) 5 MG tablet               . augmented betamethasone dipropionate (DIPROLENE-AF) 0.05 % cream               . bisoprolol-hydrochlorothiazide (ZIAC) 2.5-6.25 MG per tablet               . BRISDELLE 7.5 MG CAPS                 Dispense as written.   . budesonide-formoterol (SYMBICORT) 160-4.5 MCG/ACT inhaler   Inhalation   Inhale into the lungs.         . cyclobenzaprine (FLEXERIL) 10 MG tablet               . Diclofenac Sodium 1.5 % SOLN               . estradiol (ESTRACE) 0.5 MG tablet               . fluticasone (FLONASE) 50  MCG/ACT nasal spray               . furosemide (LASIX) 40 MG tablet               . glimepiride (AMARYL) 2 MG tablet   Oral   Take by mouth.         . hydrOXYzine (ATARAX/VISTARIL) 25 MG tablet   Oral   Take by mouth.         . losartan (COZAAR) 50 MG tablet   Oral   Take by mouth.         . metFORMIN (GLUCOPHAGE) 500 MG tablet   Oral   Take by mouth.         . metoprolol (LOPRESSOR) 50 MG tablet   Oral   Take by mouth.         . montelukast (SINGULAIR) 10 MG tablet               . nitroGLYCERIN (NITROSTAT) 0.4 MG SL tablet   Sublingual  Place under the tongue.         . nortriptyline (PAMELOR) 10 MG capsule               . nystatin cream (MYCOSTATIN)               . predniSONE (DELTASONE) 10 MG tablet   Oral   Take 6 tablets (60 mg total) by mouth daily. 60 mg once daily for 4 more days   24 tablet   0   . raloxifene (EVISTA) 60 MG tablet               . ranitidine (ZANTAC) 150 MG tablet               . sertraline (ZOLOFT) 100 MG tablet               . simvastatin (ZOCOR) 40 MG tablet               . VENTOLIN HFA 108 (90 BASE) MCG/ACT inhaler                 Dispense as written.   . VESICARE 10 MG tablet                 Dispense as written.   . zolpidem (AMBIEN) 5 MG tablet                 Allergies Penicillin g  No family history on file.  Social History Social History  Substance Use Topics  . Smoking status: Never Smoker   . Smokeless tobacco: None  . Alcohol Use: No    Review of Systems Constitutional: Negative for fever. Cardiovascular: Positive for left back chest left chest pain. Respiratory: Negative for shortness of breath. Gastrointestinal: Negative for abdominal pain Musculoskeletal: Positive for left back pain. Neurological: Negative for headache 10-point ROS otherwise negative.  ____________________________________________   PHYSICAL EXAM:  VITAL SIGNS: ED  Triage Vitals  Enc Vitals Group     BP 06/12/15 2124 149/94 mmHg     Pulse Rate 06/12/15 2124 82     Resp 06/12/15 2124 20     Temp --      Temp src --      SpO2 06/12/15 2124 98 %     Weight --      Height --      Head Cir --      Peak Flow --      Pain Score 06/12/15 2100 10     Pain Loc --      Pain Edu? --      Excl. in [place]? --     Constitutional: Alert and oriented. Mild distress due to pain. Eyes: Normal exam ENT   Head: Normocephalic and atraumatic.   Mouth/Throat: Mucous membranes are moist. Cardiovascular: Normal rate, regular rhythm. No murmu Respiratory: Normal respiratory effort without tachypnea nor retractions. Breath sounds are clear and equal bilaterally. No wheezes/rales/rhonchi. Moderate left back and left chest tenderness to palpation. Gastrointestinal: Soft and nontender. No distention.  Musculoskeletal: Nontender with normal range of motion in all extremities.  Neurologic:  Normal speech and language. No gross focal neurologic deficits  Skin:  Skin is warm, dry and intact.  Psychiatric: Mood and affect are normal. Speech and behavior are normal.   ____________________________________________    EKG  EKG reviewed and interpreted by myself shows normal sinus rhythm at 86 bpm, widened QRS, normal interval, normal axis, nonspecific ST changes. No ST elevations.  ____________________________________________    RADIOLOGY  Chest x-ray shows no acute findings.  INITIAL IMPRESSION / ASSESSMENT AND PLAN / ED COURSE  Pertinent labs & imaging results that were available during my care of the patient were reviewed by me and considered in my medical decision making (see chart for details).  Patient was sent to the emergency department with left back/left chest pain which started 6 hours ago after lifting a case of water at the grocery store. Pain is reproducible on exam, highly suspect musculoskeletal pain however given the patient's history including  CABG and will proceed with a cardiac workup including EKG, chest x-ray and lab  EKG unchanged from 05/28/15. Chest x-ray clear. Currently awaiting lab results. We will treat the patient's discomfort with morphine while awaiting workup results.  EKG unchanged, chest x-ray is clear. Labs are negative including negative troponin 6 hours after onset of chest pain. Very reproducible on exam. We will place the patient on a short course of pain medication and have her follow-up with her cardiologist. The patient is agreeable to this plan.    ____________________________________________   FINAL CLINICAL IMPRESSION(S) / ED DIAGNOSES  Back pain/chest pain Musculoskeletal pain   Harvest Dark, MD 06/12/15 2309

## 2015-06-26 DIAGNOSIS — G43719 Chronic migraine without aura, intractable, without status migrainosus: Secondary | ICD-10-CM | POA: Diagnosis not present

## 2015-06-26 DIAGNOSIS — Z79899 Other long term (current) drug therapy: Secondary | ICD-10-CM | POA: Diagnosis not present

## 2015-06-26 DIAGNOSIS — R51 Headache: Secondary | ICD-10-CM | POA: Diagnosis not present

## 2015-08-06 DIAGNOSIS — E119 Type 2 diabetes mellitus without complications: Secondary | ICD-10-CM | POA: Diagnosis not present

## 2015-08-06 DIAGNOSIS — G43009 Migraine without aura, not intractable, without status migrainosus: Secondary | ICD-10-CM | POA: Diagnosis not present

## 2015-08-06 DIAGNOSIS — J4540 Moderate persistent asthma, uncomplicated: Secondary | ICD-10-CM | POA: Diagnosis not present

## 2015-08-06 DIAGNOSIS — I10 Essential (primary) hypertension: Secondary | ICD-10-CM | POA: Diagnosis not present

## 2015-08-06 DIAGNOSIS — N951 Menopausal and female climacteric states: Secondary | ICD-10-CM | POA: Diagnosis not present

## 2015-08-06 DIAGNOSIS — Z6841 Body Mass Index (BMI) 40.0 and over, adult: Secondary | ICD-10-CM | POA: Diagnosis not present

## 2015-08-09 DIAGNOSIS — Z23 Encounter for immunization: Secondary | ICD-10-CM | POA: Diagnosis not present

## 2015-08-25 DIAGNOSIS — E119 Type 2 diabetes mellitus without complications: Secondary | ICD-10-CM | POA: Diagnosis not present

## 2015-08-25 DIAGNOSIS — E559 Vitamin D deficiency, unspecified: Secondary | ICD-10-CM | POA: Diagnosis not present

## 2015-08-25 DIAGNOSIS — E782 Mixed hyperlipidemia: Secondary | ICD-10-CM | POA: Diagnosis not present

## 2015-08-25 DIAGNOSIS — Z0001 Encounter for general adult medical examination with abnormal findings: Secondary | ICD-10-CM | POA: Diagnosis not present

## 2015-09-16 DIAGNOSIS — I25810 Atherosclerosis of coronary artery bypass graft(s) without angina pectoris: Secondary | ICD-10-CM | POA: Diagnosis not present

## 2015-09-16 DIAGNOSIS — I5032 Chronic diastolic (congestive) heart failure: Secondary | ICD-10-CM | POA: Diagnosis not present

## 2015-09-16 DIAGNOSIS — E782 Mixed hyperlipidemia: Secondary | ICD-10-CM | POA: Diagnosis not present

## 2015-09-16 DIAGNOSIS — I10 Essential (primary) hypertension: Secondary | ICD-10-CM | POA: Diagnosis not present

## 2015-09-22 DIAGNOSIS — G43719 Chronic migraine without aura, intractable, without status migrainosus: Secondary | ICD-10-CM | POA: Diagnosis not present

## 2015-09-22 DIAGNOSIS — Z6841 Body Mass Index (BMI) 40.0 and over, adult: Secondary | ICD-10-CM | POA: Diagnosis not present

## 2015-10-07 DIAGNOSIS — E559 Vitamin D deficiency, unspecified: Secondary | ICD-10-CM | POA: Diagnosis not present

## 2015-10-07 DIAGNOSIS — Z6839 Body mass index (BMI) 39.0-39.9, adult: Secondary | ICD-10-CM | POA: Diagnosis not present

## 2015-10-07 DIAGNOSIS — K029 Dental caries, unspecified: Secondary | ICD-10-CM | POA: Diagnosis not present

## 2015-10-07 DIAGNOSIS — J019 Acute sinusitis, unspecified: Secondary | ICD-10-CM | POA: Diagnosis not present

## 2015-10-07 DIAGNOSIS — G4700 Insomnia, unspecified: Secondary | ICD-10-CM | POA: Diagnosis not present

## 2015-10-07 DIAGNOSIS — I10 Essential (primary) hypertension: Secondary | ICD-10-CM | POA: Diagnosis not present

## 2015-10-07 DIAGNOSIS — E119 Type 2 diabetes mellitus without complications: Secondary | ICD-10-CM | POA: Diagnosis not present

## 2015-10-18 ENCOUNTER — Emergency Department: Payer: Medicare Other

## 2015-10-18 ENCOUNTER — Encounter (HOSPITAL_COMMUNITY): Payer: Self-pay | Admitting: *Deleted

## 2015-10-18 ENCOUNTER — Encounter: Payer: Self-pay | Admitting: Emergency Medicine

## 2015-10-18 ENCOUNTER — Inpatient Hospital Stay (HOSPITAL_COMMUNITY)
Admission: AD | Admit: 2015-10-18 | Payer: Self-pay | Source: Other Acute Inpatient Hospital | Admitting: Family Medicine

## 2015-10-18 ENCOUNTER — Emergency Department
Admission: EM | Admit: 2015-10-18 | Discharge: 2015-10-18 | Disposition: A | Discharge status: Other Acute Inpatient Hospital | Payer: Medicare Other | Attending: Emergency Medicine | Admitting: Emergency Medicine

## 2015-10-18 ENCOUNTER — Encounter (HOSPITAL_COMMUNITY)
Admission: EM | Disposition: A | Discharge status: Home / Self Care | Payer: Self-pay | Source: Other Acute Inpatient Hospital | Attending: Cardiovascular Disease

## 2015-10-18 ENCOUNTER — Inpatient Hospital Stay (HOSPITAL_COMMUNITY)
Admission: EM | Admit: 2015-10-18 | Discharge: 2015-10-22 | DRG: 247 | Disposition: A | Discharge status: Home / Self Care | Payer: Medicare Other | Source: Other Acute Inpatient Hospital | Attending: Cardiovascular Disease | Admitting: Cardiovascular Disease

## 2015-10-18 DIAGNOSIS — I213 ST elevation (STEMI) myocardial infarction of unspecified site: Secondary | ICD-10-CM

## 2015-10-18 DIAGNOSIS — Z79890 Hormone replacement therapy (postmenopausal): Secondary | ICD-10-CM | POA: Diagnosis not present

## 2015-10-18 DIAGNOSIS — Z794 Long term (current) use of insulin: Secondary | ICD-10-CM

## 2015-10-18 DIAGNOSIS — Z955 Presence of coronary angioplasty implant and graft: Secondary | ICD-10-CM | POA: Diagnosis not present

## 2015-10-18 DIAGNOSIS — M549 Dorsalgia, unspecified: Secondary | ICD-10-CM

## 2015-10-18 DIAGNOSIS — I25700 Atherosclerosis of coronary artery bypass graft(s), unspecified, with unstable angina pectoris: Secondary | ICD-10-CM | POA: Diagnosis not present

## 2015-10-18 DIAGNOSIS — R42 Dizziness and giddiness: Secondary | ICD-10-CM | POA: Insufficient documentation

## 2015-10-18 DIAGNOSIS — Z7982 Long term (current) use of aspirin: Secondary | ICD-10-CM

## 2015-10-18 DIAGNOSIS — I2582 Chronic total occlusion of coronary artery: Secondary | ICD-10-CM | POA: Diagnosis not present

## 2015-10-18 DIAGNOSIS — I110 Hypertensive heart disease with heart failure: Secondary | ICD-10-CM | POA: Diagnosis present

## 2015-10-18 DIAGNOSIS — Z79899 Other long term (current) drug therapy: Secondary | ICD-10-CM

## 2015-10-18 DIAGNOSIS — E119 Type 2 diabetes mellitus without complications: Secondary | ICD-10-CM | POA: Diagnosis not present

## 2015-10-18 DIAGNOSIS — I229 Subsequent ST elevation (STEMI) myocardial infarction of unspecified site: Secondary | ICD-10-CM | POA: Diagnosis not present

## 2015-10-18 DIAGNOSIS — E875 Hyperkalemia: Secondary | ICD-10-CM | POA: Diagnosis present

## 2015-10-18 DIAGNOSIS — E1159 Type 2 diabetes mellitus with other circulatory complications: Secondary | ICD-10-CM | POA: Diagnosis not present

## 2015-10-18 DIAGNOSIS — I255 Ischemic cardiomyopathy: Secondary | ICD-10-CM | POA: Diagnosis present

## 2015-10-18 DIAGNOSIS — I119 Hypertensive heart disease without heart failure: Secondary | ICD-10-CM | POA: Insufficient documentation

## 2015-10-18 DIAGNOSIS — I25719 Atherosclerosis of autologous vein coronary artery bypass graft(s) with unspecified angina pectoris: Secondary | ICD-10-CM

## 2015-10-18 DIAGNOSIS — Z7984 Long term (current) use of oral hypoglycemic drugs: Secondary | ICD-10-CM | POA: Insufficient documentation

## 2015-10-18 DIAGNOSIS — I252 Old myocardial infarction: Secondary | ICD-10-CM | POA: Diagnosis not present

## 2015-10-18 DIAGNOSIS — I5042 Chronic combined systolic (congestive) and diastolic (congestive) heart failure: Secondary | ICD-10-CM | POA: Diagnosis not present

## 2015-10-18 DIAGNOSIS — I25810 Atherosclerosis of coronary artery bypass graft(s) without angina pectoris: Secondary | ICD-10-CM | POA: Diagnosis present

## 2015-10-18 DIAGNOSIS — R079 Chest pain, unspecified: Secondary | ICD-10-CM

## 2015-10-18 DIAGNOSIS — Z6839 Body mass index (BMI) 39.0-39.9, adult: Secondary | ICD-10-CM

## 2015-10-18 DIAGNOSIS — K760 Fatty (change of) liver, not elsewhere classified: Secondary | ICD-10-CM | POA: Diagnosis not present

## 2015-10-18 DIAGNOSIS — I10 Essential (primary) hypertension: Secondary | ICD-10-CM | POA: Insufficient documentation

## 2015-10-18 DIAGNOSIS — I2119 ST elevation (STEMI) myocardial infarction involving other coronary artery of inferior wall: Secondary | ICD-10-CM | POA: Diagnosis not present

## 2015-10-18 DIAGNOSIS — R109 Unspecified abdominal pain: Secondary | ICD-10-CM | POA: Diagnosis not present

## 2015-10-18 DIAGNOSIS — I2510 Atherosclerotic heart disease of native coronary artery without angina pectoris: Secondary | ICD-10-CM | POA: Diagnosis present

## 2015-10-18 DIAGNOSIS — E782 Mixed hyperlipidemia: Secondary | ICD-10-CM | POA: Insufficient documentation

## 2015-10-18 DIAGNOSIS — I2111 ST elevation (STEMI) myocardial infarction involving right coronary artery: Secondary | ICD-10-CM

## 2015-10-18 DIAGNOSIS — R1031 Right lower quadrant pain: Secondary | ICD-10-CM

## 2015-10-18 DIAGNOSIS — I214 Non-ST elevation (NSTEMI) myocardial infarction: Secondary | ICD-10-CM

## 2015-10-18 DIAGNOSIS — E785 Hyperlipidemia, unspecified: Secondary | ICD-10-CM | POA: Diagnosis not present

## 2015-10-18 DIAGNOSIS — E876 Hypokalemia: Secondary | ICD-10-CM | POA: Diagnosis not present

## 2015-10-18 DIAGNOSIS — R0789 Other chest pain: Secondary | ICD-10-CM | POA: Diagnosis not present

## 2015-10-18 DIAGNOSIS — I455 Other specified heart block: Secondary | ICD-10-CM | POA: Diagnosis not present

## 2015-10-18 HISTORY — PX: CARDIAC CATHETERIZATION: SHX172

## 2015-10-18 HISTORY — DX: Atherosclerotic heart disease of native coronary artery without angina pectoris: I25.10

## 2015-10-18 HISTORY — DX: Acute myocardial infarction, unspecified: I21.9

## 2015-10-18 LAB — POCT I-STAT, CHEM 8
BUN: 9 mg/dL (ref 6–20)
CALCIUM ION: 1.22 mmol/L (ref 1.12–1.23)
Chloride: 106 mmol/L (ref 101–111)
Creatinine, Ser: 0.8 mg/dL (ref 0.44–1.00)
Glucose, Bld: 161 mg/dL — ABNORMAL HIGH (ref 65–99)
HEMATOCRIT: 36 % (ref 36.0–46.0)
HEMOGLOBIN: 12.2 g/dL (ref 12.0–15.0)
Potassium: 4.2 mmol/L (ref 3.5–5.1)
SODIUM: 139 mmol/L (ref 135–145)
TCO2: 21 mmol/L (ref 0–100)

## 2015-10-18 LAB — COMPREHENSIVE METABOLIC PANEL
ALBUMIN: 3.8 g/dL (ref 3.5–5.0)
ALK PHOS: 59 U/L (ref 38–126)
ALT: 15 U/L (ref 14–54)
AST: 23 U/L (ref 15–41)
Anion gap: 12 (ref 5–15)
BUN: 11 mg/dL (ref 6–20)
CHLORIDE: 101 mmol/L (ref 101–111)
CO2: 18 mmol/L — AB (ref 22–32)
CREATININE: 1.01 mg/dL — AB (ref 0.44–1.00)
Calcium: 8.8 mg/dL — ABNORMAL LOW (ref 8.9–10.3)
GFR calc Af Amer: >60 mL/min (ref >60)
GFR calc non Af Amer: >60 mL/min (ref >60)
GLUCOSE: 203 mg/dL — AB (ref 65–99)
Potassium: 2.8 mmol/L — CL (ref 3.5–5.1)
SODIUM: 131 mmol/L — AB (ref 135–145)
Total Bilirubin: 0.7 mg/dL (ref 0.3–1.2)
Total Protein: 7.3 g/dL (ref 6.5–8.1)

## 2015-10-18 LAB — CBC WITH DIFFERENTIAL/PLATELET
BASOS ABS: 0 10*3/uL (ref 0–0.1)
Basophils Relative: 0 %
Eosinophils Absolute: 0.1 10*3/uL (ref 0–0.7)
Eosinophils Relative: 1 %
HEMATOCRIT: 36.2 % (ref 35.0–47.0)
Hemoglobin: 11.9 g/dL — ABNORMAL LOW (ref 12.0–16.0)
LYMPHS ABS: 5.1 10*3/uL — AB (ref 1.0–3.6)
LYMPHS PCT: 37 %
MCH: 25.4 pg — AB (ref 26.0–34.0)
MCHC: 32.9 g/dL (ref 32.0–36.0)
MCV: 77.4 fL — AB (ref 80.0–100.0)
MONO ABS: 0.6 10*3/uL (ref 0.2–0.9)
MONOS PCT: 4 %
NEUTROS ABS: 7.9 10*3/uL — AB (ref 1.4–6.5)
Neutrophils Relative %: 58 %
Platelets: 221 10*3/uL (ref 150–440)
RBC: 4.68 MIL/uL (ref 3.80–5.20)
RDW: 16.8 % — AB (ref 11.5–14.5)
WBC: 13.6 10*3/uL — ABNORMAL HIGH (ref 3.6–11.0)

## 2015-10-18 LAB — PROTIME-INR
INR: 1.24
Prothrombin Time: 15.8 seconds — ABNORMAL HIGH (ref 11.4–15.0)

## 2015-10-18 LAB — TROPONIN I
TROPONIN I: 0.35 ng/mL — AB (ref <0.031)
TROPONIN I: 10.12 ng/mL — AB (ref <0.031)
Troponin I: 0.21 ng/mL — ABNORMAL HIGH (ref <0.031)

## 2015-10-18 LAB — POCT ACTIVATED CLOTTING TIME: Activated Clotting Time: 420 seconds

## 2015-10-18 LAB — APTT
APTT: 28 s (ref 24–36)
APTT: 73 s — AB (ref 24–37)

## 2015-10-18 LAB — MRSA PCR SCREENING: MRSA BY PCR: NEGATIVE

## 2015-10-18 LAB — LIPASE, BLOOD: Lipase: 17 U/L (ref 11–51)

## 2015-10-18 LAB — GLUCOSE, CAPILLARY: Glucose-Capillary: 157 mg/dL — ABNORMAL HIGH (ref 65–99)

## 2015-10-18 SURGERY — LEFT HEART CATH AND CORS/GRAFTS ANGIOGRAPHY

## 2015-10-18 MED ORDER — SODIUM CHLORIDE 0.9 % IV SOLN
1.0000 mg/kg/h | INTRAVENOUS | Status: AC
  Administered 2015-10-18: 1 mg/kg/h via INTRAVENOUS
  Filled 2015-10-18 (×3): qty 250

## 2015-10-18 MED ORDER — TICAGRELOR 90 MG PO TABS
90.0000 mg | ORAL_TABLET | Freq: Two times a day (BID) | ORAL | Status: DC
  Administered 2015-10-19 – 2015-10-22 (×7): 90 mg via ORAL
  Filled 2015-10-18 (×8): qty 1

## 2015-10-18 MED ORDER — HEPARIN (PORCINE) IN NACL 100-0.45 UNIT/ML-% IJ SOLN
1150.0000 [IU]/h | INTRAMUSCULAR | Status: DC
  Administered 2015-10-18: 1150 [IU]/h via INTRAVENOUS
  Filled 2015-10-18: qty 250

## 2015-10-18 MED ORDER — BIVALIRUDIN 250 MG IV SOLR
250.0000 mg | INTRAVENOUS | Status: DC | PRN
  Administered 2015-10-18 (×2): 1.75 mg/kg/h via INTRAVENOUS

## 2015-10-18 MED ORDER — MORPHINE SULFATE (PF) 2 MG/ML IV SOLN
2.0000 mg | INTRAVENOUS | Status: DC | PRN
  Administered 2015-10-18 – 2015-10-20 (×6): 2 mg via INTRAVENOUS
  Filled 2015-10-18 (×6): qty 1

## 2015-10-18 MED ORDER — ASPIRIN 81 MG PO CHEW
81.0000 mg | CHEWABLE_TABLET | Freq: Every day | ORAL | Status: DC
  Administered 2015-10-19 – 2015-10-22 (×4): 81 mg via ORAL
  Filled 2015-10-18 (×4): qty 1

## 2015-10-18 MED ORDER — NITROGLYCERIN 0.4 MG SL SUBL
0.4000 mg | SUBLINGUAL_TABLET | SUBLINGUAL | Status: DC | PRN
  Administered 2015-10-18 (×3): 0.4 mg via SUBLINGUAL
  Filled 2015-10-18: qty 1

## 2015-10-18 MED ORDER — ONDANSETRON HCL 4 MG/2ML IJ SOLN
INTRAMUSCULAR | Status: DC | PRN
  Administered 2015-10-18: 4 mg via INTRAVENOUS

## 2015-10-18 MED ORDER — HEPARIN (PORCINE) IN NACL 100-0.45 UNIT/ML-% IJ SOLN: 1000.0000 [IU]/h | Freq: Once | INTRAMUSCULAR | Status: DC

## 2015-10-18 MED ORDER — LOSARTAN POTASSIUM 50 MG PO TABS
50.0000 mg | ORAL_TABLET | Freq: Every day | ORAL | Status: DC
  Administered 2015-10-19 – 2015-10-22 (×4): 50 mg via ORAL
  Filled 2015-10-18 (×6): qty 1

## 2015-10-18 MED ORDER — LIDOCAINE HCL (PF) 1 % IJ SOLN
INTRAMUSCULAR | Status: AC
  Filled 2015-10-18: qty 30

## 2015-10-18 MED ORDER — ONDANSETRON HCL 4 MG/2ML IJ SOLN
4.0000 mg | Freq: Once | INTRAMUSCULAR | Status: AC
  Administered 2015-10-18: 4 mg via INTRAVENOUS

## 2015-10-18 MED ORDER — SODIUM CHLORIDE 0.9 % IV BOLUS (SEPSIS)
500.0000 mL | Freq: Once | INTRAVENOUS | Status: AC
  Administered 2015-10-18: 500 mL via INTRAVENOUS

## 2015-10-18 MED ORDER — POTASSIUM CHLORIDE 10 MEQ/100ML IV SOLN
10.0000 meq | INTRAVENOUS | Status: DC
  Filled 2015-10-18 (×3): qty 100

## 2015-10-18 MED ORDER — TIROFIBAN (AGGRASTAT) BOLUS VIA INFUSION
INTRAVENOUS | Status: DC | PRN
  Administered 2015-10-18: 2885 ug via INTRAVENOUS

## 2015-10-18 MED ORDER — RALOXIFENE HCL 60 MG PO TABS
60.0000 mg | ORAL_TABLET | Freq: Every day | ORAL | Status: DC
  Administered 2015-10-18 – 2015-10-22 (×5): 60 mg via ORAL
  Filled 2015-10-18 (×5): qty 1

## 2015-10-18 MED ORDER — SODIUM CHLORIDE 0.9% FLUSH
3.0000 mL | INTRAVENOUS | Status: DC | PRN
  Administered 2015-10-20: 3 mL via INTRAVENOUS
  Filled 2015-10-18: qty 3

## 2015-10-18 MED ORDER — SERTRALINE HCL 100 MG PO TABS
100.0000 mg | ORAL_TABLET | Freq: Every day | ORAL | Status: DC
  Administered 2015-10-18 – 2015-10-22 (×5): 100 mg via ORAL
  Filled 2015-10-18 (×5): qty 1

## 2015-10-18 MED ORDER — MORPHINE SULFATE (PF) 4 MG/ML IV SOLN
4.0000 mg | Freq: Once | INTRAVENOUS | Status: AC
  Administered 2015-10-18: 4 mg via INTRAVENOUS

## 2015-10-18 MED ORDER — HYDROMORPHONE HCL 1 MG/ML IJ SOLN
INTRAMUSCULAR | Status: AC
  Administered 2015-10-18: 1 mg via INTRAVENOUS
  Filled 2015-10-18: qty 1

## 2015-10-18 MED ORDER — HEPARIN SODIUM (PORCINE) 5000 UNIT/ML IJ SOLN: 4000.0000 [IU] | Freq: Once | INTRAMUSCULAR | Status: DC

## 2015-10-18 MED ORDER — HYDROMORPHONE HCL 1 MG/ML IJ SOLN
1.0000 mg | Freq: Once | INTRAMUSCULAR | Status: AC
  Administered 2015-10-18: 1 mg via INTRAVENOUS
  Filled 2015-10-18: qty 1

## 2015-10-18 MED ORDER — SULFAMETHOXAZOLE-TRIMETHOPRIM 800-160 MG PO TABS
1.0000 | ORAL_TABLET | Freq: Two times a day (BID) | ORAL | Status: DC
  Administered 2015-10-18 – 2015-10-22 (×8): 1 via ORAL
  Filled 2015-10-18 (×9): qty 1

## 2015-10-18 MED ORDER — SIMVASTATIN 40 MG PO TABS: 40.0000 mg | ORAL_TABLET | Freq: Every day | ORAL | Status: DC

## 2015-10-18 MED ORDER — SODIUM CHLORIDE 0.9 % WEIGHT BASED INFUSION
1.0000 mL/kg/h | INTRAVENOUS | Status: AC
  Administered 2015-10-18: 1 mL/kg/h via INTRAVENOUS

## 2015-10-18 MED ORDER — POTASSIUM CHLORIDE 10 MEQ/100ML IV SOLN
10.0000 meq | INTRAVENOUS | Status: DC
  Filled 2015-10-18 (×5): qty 100

## 2015-10-18 MED ORDER — METFORMIN HCL 500 MG PO TABS
250.0000 mg | ORAL_TABLET | Freq: Two times a day (BID) | ORAL | Status: DC
Start: 2015-10-18 — End: 2015-10-19
  Administered 2015-10-19: 250 mg via ORAL
  Filled 2015-10-18: qty 1

## 2015-10-18 MED ORDER — MONTELUKAST SODIUM 10 MG PO TABS
10.0000 mg | ORAL_TABLET | Freq: Every day | ORAL | Status: DC
  Administered 2015-10-18 – 2015-10-22 (×5): 10 mg via ORAL
  Filled 2015-10-18 (×6): qty 1

## 2015-10-18 MED ORDER — TIROFIBAN HCL IN NACL 5-0.9 MG/100ML-% IV SOLN: INTRAVENOUS | Status: DC | PRN

## 2015-10-18 MED ORDER — CYCLOBENZAPRINE HCL 10 MG PO TABS
10.0000 mg | ORAL_TABLET | Freq: Every day | ORAL | Status: DC
  Administered 2015-10-18 – 2015-10-21 (×4): 10 mg via ORAL
  Filled 2015-10-18 (×4): qty 1

## 2015-10-18 MED ORDER — NITROGLYCERIN IN D5W 200-5 MCG/ML-% IV SOLN
0.0000 ug/min | Freq: Once | INTRAVENOUS | Status: AC
  Administered 2015-10-18: 5 ug/min via INTRAVENOUS
  Filled 2015-10-18: qty 250

## 2015-10-18 MED ORDER — PAROXETINE MESYLATE 7.5 MG PO CAPS: 7.5000 mg | ORAL_CAPSULE | Freq: Every day | ORAL | Status: DC

## 2015-10-18 MED ORDER — ALBUTEROL SULFATE (2.5 MG/3ML) 0.083% IN NEBU: 2.5000 mg | INHALATION_SOLUTION | Freq: Four times a day (QID) | RESPIRATORY_TRACT | Status: DC | PRN

## 2015-10-18 MED ORDER — ONDANSETRON HCL 4 MG/2ML IJ SOLN
INTRAMUSCULAR | Status: AC
  Administered 2015-10-18: 4 mg via INTRAVENOUS
  Filled 2015-10-18: qty 2

## 2015-10-18 MED ORDER — HYDROMORPHONE HCL 1 MG/ML IJ SOLN
1.0000 mg | Freq: Once | INTRAMUSCULAR | Status: AC
  Administered 2015-10-18: 1 mg via INTRAVENOUS

## 2015-10-18 MED ORDER — ATORVASTATIN CALCIUM 80 MG PO TABS
80.0000 mg | ORAL_TABLET | Freq: Every day | ORAL | Status: DC
  Administered 2015-10-18 – 2015-10-21 (×4): 80 mg via ORAL
  Filled 2015-10-18 (×4): qty 1

## 2015-10-18 MED ORDER — ESTRADIOL 1 MG PO TABS
0.5000 mg | ORAL_TABLET | Freq: Every day | ORAL | Status: DC
  Administered 2015-10-18 – 2015-10-22 (×5): 0.5 mg via ORAL
  Filled 2015-10-18 (×5): qty 0.5

## 2015-10-18 MED ORDER — TIROFIBAN HCL IN NACL 5-0.9 MG/100ML-% IV SOLN
INTRAVENOUS | Status: AC
  Filled 2015-10-18: qty 100

## 2015-10-18 MED ORDER — NITROGLYCERIN 0.4 MG SL SUBL
SUBLINGUAL_TABLET | SUBLINGUAL | Status: AC
  Administered 2015-10-18: 0.4 mg via SUBLINGUAL
  Filled 2015-10-18: qty 2

## 2015-10-18 MED ORDER — TICAGRELOR 90 MG PO TABS
ORAL_TABLET | ORAL | Status: DC | PRN
  Administered 2015-10-18: 180 mg via ORAL

## 2015-10-18 MED ORDER — NITROGLYCERIN 0.4 MG SL SUBL
0.4000 mg | SUBLINGUAL_TABLET | SUBLINGUAL | Status: DC | PRN
  Administered 2015-10-18 (×4): 0.4 mg via SUBLINGUAL
  Filled 2015-10-18: qty 1

## 2015-10-18 MED ORDER — TICAGRELOR 90 MG PO TABS
ORAL_TABLET | ORAL | Status: AC
  Filled 2015-10-18: qty 2

## 2015-10-18 MED ORDER — ACETAMINOPHEN 325 MG PO TABS
650.0000 mg | ORAL_TABLET | ORAL | Status: DC | PRN
  Filled 2015-10-18: qty 2

## 2015-10-18 MED ORDER — HEPARIN BOLUS VIA INFUSION
4000.0000 [IU] | Freq: Once | INTRAVENOUS | Status: AC
  Administered 2015-10-18: 4000 [IU] via INTRAVENOUS
  Filled 2015-10-18: qty 4000

## 2015-10-18 MED ORDER — IOPAMIDOL (ISOVUE-370) INJECTION 76%
INTRAVENOUS | Status: DC | PRN
  Administered 2015-10-18: 190 mL via INTRA_ARTERIAL

## 2015-10-18 MED ORDER — ONDANSETRON HCL 4 MG/2ML IJ SOLN
INTRAMUSCULAR | Status: AC
  Filled 2015-10-18: qty 2

## 2015-10-18 MED ORDER — ZOLPIDEM TARTRATE 5 MG PO TABS
5.0000 mg | ORAL_TABLET | Freq: Every day | ORAL | Status: DC
  Administered 2015-10-18 – 2015-10-21 (×4): 5 mg via ORAL
  Filled 2015-10-18 (×4): qty 1

## 2015-10-18 MED ORDER — SODIUM CHLORIDE 0.9% FLUSH
3.0000 mL | Freq: Two times a day (BID) | INTRAVENOUS | Status: DC
Start: 2015-10-18 — End: 2015-10-22
  Administered 2015-10-18 – 2015-10-21 (×6): 3 mL via INTRAVENOUS

## 2015-10-18 MED ORDER — IOPAMIDOL (ISOVUE-370) INJECTION 76%
INTRAVENOUS | Status: AC
  Filled 2015-10-18: qty 100

## 2015-10-18 MED ORDER — BIVALIRUDIN 250 MG IV SOLR
INTRAVENOUS | Status: AC
  Filled 2015-10-18: qty 250

## 2015-10-18 MED ORDER — FUROSEMIDE 40 MG PO TABS
40.0000 mg | ORAL_TABLET | Freq: Every day | ORAL | Status: DC
  Administered 2015-10-18 – 2015-10-22 (×5): 40 mg via ORAL
  Filled 2015-10-18 (×6): qty 1

## 2015-10-18 MED ORDER — ATROPINE SULFATE 0.1 MG/ML IJ SOLN
INTRAMUSCULAR | Status: AC
  Filled 2015-10-18: qty 10

## 2015-10-18 MED ORDER — MORPHINE SULFATE (PF) 2 MG/ML IV SOLN
2.0000 mg | Freq: Once | INTRAVENOUS | Status: AC
  Administered 2015-10-18: 2 mg via INTRAVENOUS
  Filled 2015-10-18: qty 1

## 2015-10-18 MED ORDER — BIVALIRUDIN BOLUS VIA INFUSION - CUPID
INTRAVENOUS | Status: DC | PRN
  Administered 2015-10-18: 86.55 mg via INTRAVENOUS

## 2015-10-18 MED ORDER — ONDANSETRON HCL 4 MG/2ML IJ SOLN
4.0000 mg | Freq: Four times a day (QID) | INTRAMUSCULAR | Status: DC | PRN
  Administered 2015-10-18 – 2015-10-21 (×2): 4 mg via INTRAVENOUS
  Filled 2015-10-18 (×2): qty 2

## 2015-10-18 MED ORDER — MORPHINE SULFATE (PF) 4 MG/ML IV SOLN
INTRAVENOUS | Status: AC
  Administered 2015-10-18: 4 mg via INTRAVENOUS
  Filled 2015-10-18: qty 1

## 2015-10-18 MED ORDER — AMLODIPINE BESYLATE 5 MG PO TABS
5.0000 mg | ORAL_TABLET | Freq: Every day | ORAL | Status: DC
  Administered 2015-10-18: 5 mg via ORAL
  Filled 2015-10-18: qty 1

## 2015-10-18 MED ORDER — NITROGLYCERIN 1 MG/10 ML FOR IR/CATH LAB
INTRA_ARTERIAL | Status: AC
  Filled 2015-10-18: qty 10

## 2015-10-18 MED ORDER — LIDOCAINE HCL (PF) 1 % IJ SOLN
INTRAMUSCULAR | Status: DC | PRN
  Administered 2015-10-18: 30 mL via INTRADERMAL

## 2015-10-18 MED ORDER — HEPARIN (PORCINE) IN NACL 2-0.9 UNIT/ML-% IJ SOLN
INTRAMUSCULAR | Status: DC | PRN
  Administered 2015-10-18: 1000 mL

## 2015-10-18 MED ORDER — PREDNISONE 50 MG PO TABS: 60.0000 mg | ORAL_TABLET | Freq: Every day | ORAL | Status: DC

## 2015-10-18 MED ORDER — ASPIRIN EC 81 MG PO TBEC: 81.0000 mg | DELAYED_RELEASE_TABLET | Freq: Every day | ORAL | Status: DC

## 2015-10-18 MED ORDER — SODIUM CHLORIDE 0.9 % IV SOLN
INTRAVENOUS | Status: DC | PRN
  Administered 2015-10-18: 100 mL/h via INTRAVENOUS

## 2015-10-18 MED ORDER — SODIUM CHLORIDE 0.9 % IV SOLN: 250.0000 mL | INTRAVENOUS | Status: DC | PRN

## 2015-10-18 MED ORDER — IOPAMIDOL (ISOVUE-370) INJECTION 76%
100.0000 mL | Freq: Once | INTRAVENOUS | Status: AC | PRN
  Administered 2015-10-18: 100 mL via INTRAVENOUS

## 2015-10-18 MED ORDER — HEPARIN (PORCINE) IN NACL 2-0.9 UNIT/ML-% IJ SOLN
INTRAMUSCULAR | Status: AC
  Filled 2015-10-18: qty 1000

## 2015-10-18 SURGICAL SUPPLY — 16 items
BALLN MINITREK RX 2.0X12 (BALLOONS) ×2
BALLOON MINITREK RX 2.0X12 (BALLOONS) ×1 IMPLANT
CATH EXTRAC PRONTO 5.5F 138CM (CATHETERS) ×2 IMPLANT
CATH INFINITI 5FR MULTPACK ANG (CATHETERS) ×2 IMPLANT
ELECT DEFIB PAD ADLT CADENCE (PAD) ×2 IMPLANT
GUIDE CATH RUNWAY 6FR FR4 (CATHETERS) ×2 IMPLANT
KIT ENCORE 26 ADVANTAGE (KITS) ×2 IMPLANT
KIT HEART LEFT (KITS) ×2 IMPLANT
PACK CARDIAC CATHETERIZATION (CUSTOM PROCEDURE TRAY) ×2 IMPLANT
SHEATH PINNACLE 6F 10CM (SHEATH) ×2 IMPLANT
STENT SYNERGY DES 3X16 (Permanent Stent) ×2 IMPLANT
SYR MEDRAD MARK V 150ML (SYRINGE) ×2 IMPLANT
TRANSDUCER W/STOPCOCK (MISCELLANEOUS) ×2 IMPLANT
TUBING CIL FLEX 10 FLL-RA (TUBING) ×2 IMPLANT
WIRE ASAHI PROWATER 180CM (WIRE) ×2 IMPLANT
WIRE EMERALD 3MM-J .035X150CM (WIRE) ×2 IMPLANT

## 2015-10-18 NOTE — ED Notes (Addendum)
Patient brought in by [facility] from home for chest pain that started this morning around 6am, patient also c/o numbness in her left arm. 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 Patient appears moderately anxious at this time

## 2015-10-18 NOTE — Progress Notes (Signed)
Right Femoral sheathe pulled at 21:38 by two RN's, Fayrene Fearing and Cipriano Mile. Pressure held for 20 minutes and VS stable throughout pull. Will continue to monitor.

## 2015-10-18 NOTE — ED Notes (Addendum)
Patient transported to CT by RN and CT tech, patient stable at this time

## 2015-10-18 NOTE — ED Provider Notes (Addendum)
[facility] Emergency Department Provider Note   ____________________________________________  Time seen:  I have reviewed the triage vital signs and the triage nursing note.  HISTORY  Chief Complaint Chest Pain   Historian Patient  HPI Felicia Woods is a 60 y.o. female with a history of coronary artery disease and bypass, and 2010, who woke up this morning around 6:30 AM and had acute onset central and left-sided chest pain which is sharp and radiating into the left arm and she feels like her left arm feels tingling. She hasn't really had this feeling before, but states it might feel like prior heart symptoms. Mild shortness of breath without wheezing. No recent illness such as cough, fever, vomiting or abdominal pain.  Symptoms are moderate. Nothing makes it worse or better    Past Medical History  Diagnosis Date  . Hypertension   . Diabetes mellitus without complication ([place])   . MI (myocardial infarction) ([place])     There are no active problems to display for this patient.   Past Surgical History  Procedure Laterality Date  . Cardiac surgery    . Abdominal hysterectomy    . Cholecystectomy    . Coronary artery bypass graft      4 vessels - 2010    Current Outpatient Rx  Name  Route  Sig  Dispense  Refill  . amLODipine (NORVASC) 5 MG tablet   Oral   Take 5 mg by mouth daily.          Marland Kitchen aspirin EC 81 MG tablet   Oral   Take 81 mg by mouth daily.         Marland Kitchen augmented betamethasone dipropionate (DIPROLENE-AF) 0.05 % cream   Topical   Apply 1 application topically daily.         . bisoprolol-hydrochlorothiazide (ZIAC) 2.5-6.25 MG per tablet   Oral   Take 1 tablet by mouth daily.          Marland Kitchen BRISDELLE 7.5 MG CAPS   Oral   Take 7.5 mg by mouth daily.            Dispense as written.   . cyclobenzaprine (FLEXERIL) 10 MG tablet   Oral   Take 10 mg by mouth at bedtime.          Marland Kitchen estradiol (ESTRACE) 0.5 MG tablet    Oral   Take 0.5 mg by mouth daily.          . furosemide (LASIX) 40 MG tablet   Oral   Take 40 mg by mouth daily.          Marland Kitchen HYDROcodone-acetaminophen (NORCO/VICODIN) 5-325 MG tablet   Oral   Take 1 tablet by mouth every 8 (eight) hours as needed. For pain.         Marland Kitchen losartan (COZAAR) 50 MG tablet   Oral   Take 50 mg by mouth daily.          . metFORMIN (GLUCOPHAGE) 500 MG tablet   Oral   Take 250 mg by mouth 2 (two) times daily with a meal.          . montelukast (SINGULAIR) 10 MG tablet   Oral   Take 10 mg by mouth daily.          . nitroGLYCERIN (NITROSTAT) 0.4 MG SL tablet   Sublingual   Place 0.4 mg under the tongue every 5 (five) minutes x 3 doses as needed for chest pain. *If no relief  call MD or go to Emergency Room*         . raloxifene (EVISTA) 60 MG tablet   Oral   Take 60 mg by mouth daily.          . ranitidine (ZANTAC) 150 MG tablet   Oral   Take 150 mg by mouth daily.          . sertraline (ZOLOFT) 100 MG tablet   Oral   Take 100 mg by mouth daily.          . simvastatin (ZOCOR) 40 MG tablet   Oral   Take 40 mg by mouth daily.          Marland Kitchen sulfamethoxazole-trimethoprim (BACTRIM DS,SEPTRA DS) 800-160 MG tablet   Oral   Take 1 tablet by mouth 2 (two) times daily. X 14 days.         . VENTOLIN HFA 108 (90 BASE) MCG/ACT inhaler   Inhalation   Inhale 2 puffs into the lungs every 6 (six) hours as needed for wheezing or shortness of breath.            Dispense as written.   . VESICARE 10 MG tablet   Oral   Take 10 mg by mouth daily.            Dispense as written.   . zolpidem (AMBIEN) 10 MG tablet   Oral   Take 10 mg by mouth at bedtime.         Marland Kitchen oxyCODONE-acetaminophen (ROXICET) 5-325 MG tablet   Oral   Take 1 tablet by mouth every 6 (six) hours as needed.   20 tablet   0   . predniSONE (DELTASONE) 10 MG tablet   Oral   Take 6 tablets (60 mg total) by mouth daily. 60 mg once daily for 4 more days   24  tablet   0     Allergies Penicillin g  No family history on file.  Social History Social History  Substance Use Topics  . Smoking status: Never Smoker   . Smokeless tobacco: None  . Alcohol Use: No    Review of Systems  Constitutional: Negative for fever. Eyes: Negative for visual changes. ENT: Negative for sore throat. Cardiovascular: Positive for chest pain. Respiratory: Negative for shortness of breath. Gastrointestinal: Negative for abdominal pain, vomiting and diarrhea. Genitourinary: Negative for dysuria. Musculoskeletal: Negative for back pain. Skin: Negative for rash. Neurological: Negative for headache. 10 point Review of Systems otherwise negative ____________________________________________   PHYSICAL EXAM:  VITAL SIGNS: ED Triage Vitals  Enc Vitals Group     BP 10/18/15 0741 117/69 mmHg     Pulse Rate 10/18/15 0741 85     Resp 10/18/15 0741 18     Temp 10/18/15 0741 97.5 F (36.4 C)     Temp Source 10/18/15 0741 Oral     SpO2 10/18/15 0741 100 %     Weight --      Height --      Head Cir --      Peak Flow --      Pain Score 10/18/15 0747 10     Pain Loc --      Pain Edu? --      Excl. in [place]? --      Constitutional: Alert and oriented. Appears like she is in pain. HEENT   Head: Normocephalic and atraumatic.      Eyes: Conjunctivae are normal. PERRL. Normal extraocular movements.  Ears:         Nose: No congestion/rhinnorhea.   Mouth/Throat: Mucous membranes are moist.   Neck: No stridor. Cardiovascular/Chest: Normal rate, regular rhythm.  No murmurs, rubs, or gallops. Respiratory: Normal respiratory effort without tachypnea nor retractions. Breath sounds are clear and equal bilaterally. No wheezes/rales/rhonchi. Gastrointestinal: Soft. No distention, no guarding, no rebound. Nontender.    Genitourinary/rectal:Deferred Musculoskeletal: Nontender with normal range of motion in all extremities. No joint effusions.  No lower  extremity tenderness.  No edema. Neurologic:  Normal speech and language. No gross or focal neurologic deficits are appreciated. Skin:  Skin is warm, dry and intact. No rash noted. Psychiatric: Mood and affect are normal. Speech and behavior are normal. Patient exhibits appropriate insight and judgment.  ____________________________________________   EKG I, Lisa Roca, MD, the attending physician have personally viewed and interpreted all ECGs.  7:35 AM  81 beats per minute. normal sinus rhythm. Right bundle branch block. Normal axis. Nonspecific ST and T-wave. Minimal ST depression and T-wave inversion 1 in aVL. This EKG is similar to prior with old right bundle-branch block, but ST/T-wave changes in one and aVL are new  9 AM 82 bpm. Normal sinus rhythm. Right bundle branch block. Normal axis. T-wave inversions/slight ST segment depression in 1 and aVL.  11:24 a.m. 84 bpm. Normal sinus rhythm. Right bundle branch block. Normal axis. ST segment elevation 3 small boxes in3, and one half boxes and aVF. Reciprocal ST segment depression and T wave inversion in 1 and aVL.  ____________________________________________  LABS (pertinent positives/negatives)  Conference metabolic panel significant for sodium 131, potassium 2.8, creatinine 1.01 otherwise without significant abnormalities Lipase 17 Troponin less than 0.03 White blood count 13.6, hemoglobin 11.9 and platelet count 221 INR 1.24  ____________________________________________  RADIOLOGY All Xrays were viewed by me. Imaging interpreted by Radiologist.  Chest 1 view:  No acute abnormalities. __________________________________________  PROCEDURES  Procedure(s) performed: None  Critical Care performed: CRITICAL CARE Performed by: Lisa Roca   Total critical care time: 75 minutes  Critical care time was exclusive of separately billable procedures and treating other patients.  Critical care was necessary to treat or  prevent imminent or life-threatening deterioration.  Critical care was time spent personally by me on the following activities: development of treatment plan with patient and/or surrogate as well as nursing, discussions with consultants, evaluation of patient's response to treatment, examination of patient, obtaining history from patient or surrogate, ordering and performing treatments and interventions, ordering and review of laboratory studies, ordering and review of radiographic studies, pulse oximetry and re-evaluation of patient's condition.   ____________________________________________   ED COURSE / ASSESSMENT AND PLAN  Pertinent labs & imaging results that were available during my care of the patient were reviewed by me and considered in my medical decision making (see chart for details).   This patient had acute onset chest pain going to the left arm, which in some ways felt similar to prior cardiac issues, and her EKG does show some ST segment depression T-wave inversion 1 in aVL which is new from prior.  Her blood pressure was good upon arrival, she was given nitroglycerin, and reports no change with this. Next she was given morphine with Zofran due to some nausea and ongoing pain, and although this improved somewhat, she was still in significant pain. Patient was next given Dilaudid which did seem to help.  Her initial troponin is negative.  Patient continued to have chest pain despite sublingual nitros, then IV Dilaudid. I  did go to recheck her and she was extremely diaphoretic still complaining of severe chest pain, raising concern for intrathoracic emergency such as dissection. Patient was sent for CT, and her CT showed no acute emergency.  Her chest pain did ease off a little bit, and she is no longer severely diaphoretic, but given her history of CABG and onset of chest pain just within a few hours, and nonspecific EKG, I did consider that ongoing ACS may be the cause of her  symptoms, and chose to place her on nitroglycerin drip as well as heparin bolus and drip until patient can be ruled out given her strong history and current symptoms.    CONSULTATIONS:   Hospitalist for admission.   Patient / Family / Caregiver informed of clinical course, medical decision-making process, and agree with plan.  ----------------------------------------- 11:42 AM on 10/18/2015 -----------------------------------------  Our hospital has no ICU beds available for admission, and so I spoke with [facility] hospitalist, who did initially accept in transfer, but did request an additional EKG since it had now been 2 hours since her last EKG.  Patient was feeling a little bit better, but still ongoing chest pain even with nitroglycerin drip at 70 mg/min.    Her repeat EKG now shows ST segment elevation inferiorly with reciprocal changes in 1 and aVL.  I spoke with the STEMI cardiologist at [facility], Dr. Alvester Chou, who accepted in transfer after discussion and reviewing the EKG.  Patient will be transported [facility] as this is the fastest way to get the patient to the Cath Lab. A nurse will ride with the patient to maintain patient on heparin and nitroglycerin drips.  Patient was updated.   ___________________________________________   FINAL CLINICAL IMPRESSION(S) / ED DIAGNOSES   Final diagnoses:  Chest pain, unspecified chest pain type  ST elevation myocardial infarction (STEMI), unspecified artery [facility])              Note: This dictation was prepared with Dragon dictation. Any transcriptional errors that result from this process are unintentional   Lisa Roca, MD 10/18/15 [place], MD 10/18/15 [phone]

## 2015-10-18 NOTE — ED Notes (Signed)
Patient reports feeling nauseated, verbal order from Dr. Reita Cliche for Zofran 4mg  IVP

## 2015-10-18 NOTE — Progress Notes (Signed)
CRITICAL VALUE ALERT  Critical value received:  Trop 10.12  Date of notification:  10/18/2015  Time of notification:  [id number]  Critical value read back:Yes.    Nurse who received alert:  Danyah Guastella, [employer]  Expected Value, pt post STEMI and heart catheterization.

## 2015-10-18 NOTE — ED Notes (Signed)
Patient placed on 2L via [place] for comfort

## 2015-10-18 NOTE — ED Notes (Signed)
Dr. Reita Cliche informed of critical potassium level of 2.8

## 2015-10-18 NOTE — ED Notes (Signed)
Patient SBP 108, dose not titrated up

## 2015-10-18 NOTE — ED Notes (Signed)
Megan, RN notified MD regarding titration of drip to 82mcg without relief.

## 2015-10-18 NOTE — Progress Notes (Signed)
Pt groin oozing from site since admission from cath lab. Now more bleeding noted, and dressing saturated after changing dressing previous hour. Pt VSS. Angiomax discontinued at 1745 per MD order. MD paged and made aware. Order received for labs and to place femstop over site with no pressure applied from femstop. Femstop applied to Rt femoral site, pedal pulses palpable. Will continue to monitor closely.

## 2015-10-18 NOTE — ED Notes (Signed)
[facility] at bedside given report on patient. Patient to be transferred Emergency traffic to [facility] cath lab

## 2015-10-18 NOTE — Consult Note (Signed)
ANTICOAGULATION CONSULT NOTE - Initial Consult  Pharmacy Consult for heparin Indication: chest pain/ACS  Allergies  Allergen Reactions  . Penicillin G Anaphylaxis and Other (See Comments)    Has patient had a PCN reaction causing immediate rash, facial/tongue/throat swelling, SOB or lightheadedness with hypotension: [id number] Has patient had a PCN reaction causing severe rash involving mucus membranes or skin necrosis: no:[id number]} Has patient had a PCN reaction that required hospitalization no:[id number]} Has patient had a PCN reaction occurring within the last 10 years: no:[id number]} If all of the above answers are "NO", then may proceed with Cephalosporin use.    Patient Measurements: Height: 5\' 7"  (170.2 cm) Weight: 254 lb 6.2 oz (115.39 kg) IBW/kg (Calculated) : 61.6 Heparin Dosing Weight: 88.67kg  Vital Signs: Temp: 97.5 F (36.4 C) (04/15 0741) Temp Source: Oral (04/15 0741) BP: 138/73 mmHg (04/15 1000) Pulse Rate: 89 (04/15 1000)  Labs:  Recent Labs  10/18/15 0744  HGB 11.9*  HCT 36.2  PLT 221  LABPROT 15.8*  INR 1.24  CREATININE 1.01*  TROPONINI <0.03    Estimated Creatinine Clearance: 79.6 mL/min (by C-G formula based on Cr of 1.01).   Medical History: Past Medical History  Diagnosis Date  . Hypertension   . Diabetes mellitus without complication ([place])   . MI (myocardial infarction) ([place])     Medications:  Scheduled:  . heparin  4,000 Units Intravenous Once    Assessment: Pt is a 60 year old female who presents with chest pain. Review of care everywhere and PTA meds shows no home anticoagulants. Baseline INR , CBC and APTT have been obtained. Pt received 4,000 units in ED.  Goal of Therapy:  Heparin level 0.3-0.7 units/ml Monitor platelets by anticoagulation protocol: Yes   Plan:  Start heparin infusion at 1150 units/hr Check anti-Xa level in 6 hours and daily while on heparin Continue to monitor H&H and platelets  Felicia Woods D Felicia Woods,  Pharm.D Clinical Pharmacist   10/18/2015,10:21 AM

## 2015-10-18 NOTE — H&P (Signed)
CARDIOLOGY History and Physical    Patient ID: Felicia Woods MRN: [id number] DOB/AGE: 1955/12/14 60 y.o.  Admit date: 10/18/2015  Primary Physician   Lavera Guise, MD Primary Cardiologist: Dr. Nehemiah Massed at [facility] clinic Reason for Consultation: STEMI   HPI: [name] is a 60 year old married female with a past medical history of HTN, DM, CAD s/p 4 vessel CABG in 2010.   She was awaken from sleep at 6 am with sharp, stabbing chest pain that radiated to her left arm. She got up to walk to the bathroom and felt SOB, nauseous and became profusely diaphoretic. She was brought to [facility] ED. Her EKG showed ST depression in leads V1-V3, and ST elevation in leads III and AVF. First troponin was negative, second was 0.21.    She has had a previous MI, and states that the pain today was much worse than with her previous MI.  She has not had a cardiac cath since her CABG that she can remember.  She does not have any stents.    She was brought emergently to cath lab for code STEMI.   Past Medical History  Diagnosis Date  . Hypertension   . Diabetes mellitus without complication ([place])   . MI (myocardial infarction) [facility])      Past Surgical History  Procedure Laterality Date  . Cardiac surgery    . Abdominal hysterectomy    . Cholecystectomy    . Coronary artery bypass graft      4 vessels - 2010    Allergies  Allergen Reactions  . Penicillin G Anaphylaxis and Other (See Comments)    Has patient had a PCN reaction causing immediate rash, facial/tongue/throat swelling, SOB or lightheadedness with hypotension: [id number] Has patient had a PCN reaction causing severe rash involving mucus membranes or skin necrosis: no:[id number]} Has patient had a PCN reaction that required hospitalization no:[id number]} Has patient had a PCN reaction occurring within the last 10 years: no:[id number]} If all of the above answers are "NO", then may proceed with  Cephalosporin use.    I have reviewed the patient's current medications     Prior to Admission medications   Medication Sig Start Date End Date Taking? Authorizing Provider  amLODipine (NORVASC) 5 MG tablet Take 5 mg by mouth daily.  12/27/14   Historical Provider, MD  aspirin EC 81 MG tablet Take 81 mg by mouth daily.    Historical Provider, MD  augmented betamethasone dipropionate (DIPROLENE-AF) 0.05 % cream Apply 1 application topically daily. 01/09/15   Historical Provider, MD  bisoprolol-hydrochlorothiazide (ZIAC) 2.5-6.25 MG per tablet Take 1 tablet by mouth daily.  12/27/14   Historical Provider, MD  BRISDELLE 7.5 MG CAPS Take 7.5 mg by mouth daily.  12/30/14   Historical Provider, MD  cyclobenzaprine (FLEXERIL) 10 MG tablet Take 10 mg by mouth at bedtime.  12/27/14   Historical Provider, MD  estradiol (ESTRACE) 0.5 MG tablet Take 0.5 mg by mouth daily.  12/27/14   Historical Provider, MD  furosemide (LASIX) 40 MG tablet Take 40 mg by mouth daily.  12/27/14   Historical Provider, MD  HYDROcodone-acetaminophen (NORCO/VICODIN) 5-325 MG tablet Take 1 tablet by mouth every 8 (eight) hours as needed. For pain. 10/08/15   Historical Provider, MD  losartan (COZAAR) 50 MG tablet Take 50 mg by mouth daily.     Historical Provider, MD  metFORMIN (GLUCOPHAGE) 500 MG tablet Take 250 mg by mouth 2 (two) times daily with  a meal.     Historical Provider, MD  montelukast (SINGULAIR) 10 MG tablet Take 10 mg by mouth daily.  12/27/14   Historical Provider, MD  nitroGLYCERIN (NITROSTAT) 0.4 MG SL tablet Place 0.4 mg under the tongue every 5 (five) minutes x 3 doses as needed for chest pain. *If no relief call MD or go to Emergency Room*    Historical Provider, MD  oxyCODONE-acetaminophen (ROXICET) 5-325 MG tablet Take 1 tablet by mouth every 6 (six) hours as needed. 06/12/15   Harvest Dark, MD  predniSONE (DELTASONE) 10 MG tablet Take 6 tablets (60 mg total) by mouth daily. 60 mg once daily for 4 more days  05/28/15   Lisa Roca, MD  raloxifene (EVISTA) 60 MG tablet Take 60 mg by mouth daily.  12/27/14   Historical Provider, MD  ranitidine (ZANTAC) 150 MG tablet Take 150 mg by mouth daily.  12/27/14   Historical Provider, MD  sertraline (ZOLOFT) 100 MG tablet Take 100 mg by mouth daily.  12/27/14   Historical Provider, MD  simvastatin (ZOCOR) 40 MG tablet Take 40 mg by mouth daily.  12/27/14   Historical Provider, MD  sulfamethoxazole-trimethoprim (BACTRIM DS,SEPTRA DS) 800-160 MG tablet Take 1 tablet by mouth 2 (two) times daily. X 14 days. 10/07/15   Historical Provider, MD  VENTOLIN HFA 108 (90 BASE) MCG/ACT inhaler Inhale 2 puffs into the lungs every 6 (six) hours as needed for wheezing or shortness of breath.  01/01/15   Historical Provider, MD  VESICARE 10 MG tablet Take 10 mg by mouth daily.  12/27/14   Historical Provider, MD  zolpidem (AMBIEN) 10 MG tablet Take 10 mg by mouth at bedtime. 10/08/15   Historical Provider, MD     Social History   Social History  . Marital Status: Single    Spouse Name: N/A  . Number of Children: N/A  . Years of Education: N/A   Occupational History  . Not on file.   Social History Main Topics  . Smoking status: Never Smoker   . Smokeless tobacco: Not on file  . Alcohol Use: No  . Drug Use: Not on file  . Sexual Activity: Not on file   Other Topics Concern  . Not on file   Social History Narrative    No family status information on file.   No family history on file.   ROS:  Full 14 point review of systems complete and found to be negative unless listed above.  Physical Exam: There were no vitals taken for this visit.  General: Well developed, well nourished, female in no acute distress Head: Eyes PERRLA, No xanthomas.   Normocephalic and atraumatic, oropharynx without edema or exudate.  Lungs: CTA Heart: HRRR S1 S2, no rub/gallop,no murmur. pulses are 2+ extrem.   Neck: No carotid bruits. No lymphadenopathy. No JVD. Abdomen: Bowel sounds  present, abdomen soft and non-tender without masses or hernias noted. Msk:  No spine or cva tenderness. No weakness, no joint deformities or effusions. Extremities: No clubbing or cyanosis. No edema.  Neuro: Alert and oriented X 3. No focal deficits noted. Psych:  Good affect, responds appropriately Skin: No rashes or lesions noted.  Labs:   Lab Results  Component Value Date   WBC 13.6* 10/18/2015   HGB 11.9* 10/18/2015   HCT 36.2 10/18/2015   MCV 77.4* 10/18/2015   PLT 221 10/18/2015    Recent Labs  10/18/15 0744  INR 1.24    Recent Labs Lab 10/18/15 0744  NA  131*  K 2.8*  CL 101  CO2 18*  BUN 11  CREATININE 1.01*  CALCIUM 8.8*  PROT 7.3  BILITOT 0.7  ALKPHOS 59  ALT 15  AST 23  GLUCOSE 203*  ALBUMIN 3.8    Recent Labs  10/18/15 0744  TROPONINI <0.03    LIPASE  Date/Time Value Ref Range Status  10/18/2015 07:44 AM 17 11 - 51 U/L Final   ECG:  ST depression in V1-V3, elevation in III and AVF  Radiology:  Dg Chest Port 1 View  10/18/2015  CLINICAL DATA:  Chest pain since 6 a.m. today. EXAM: PORTABLE CHEST 1 VIEW COMPARISON:  06/12/2015. FINDINGS: Normal sized heart. Clear lungs with normal vascularity. Post CABG changes. Thoracic spine degenerative changes. IMPRESSION: No acute abnormality. Electronically Signed   By: Claudie Revering M.D.   On: 10/18/2015 08:12   Ct Angio Chest/abd/pel For Dissection W And/or W/wo  10/18/2015  CLINICAL DATA:  Chest pain since 6 a.m. today. Left arm numbness. Previous CABG. Previous cholecystectomy and hysterectomy. EXAM: CT ANGIOGRAPHY CHEST, ABDOMEN AND PELVIS TECHNIQUE: Multidetector CT imaging through the chest, abdomen and pelvis was performed using the standard protocol during bolus administration of intravenous contrast. Multiplanar reconstructed images and MIPs were obtained and reviewed to evaluate the vascular anatomy. CONTRAST:  100 cc Isovue 370 COMPARISON:  Chest radiographs dated 06/12/2015. FINDINGS: CTA CHEST  FINDINGS Post CABG changes. Normally opacified aorta and pulmonary arteries. No pulmonary arterial filling defects. No aortic dissection or aneurysm. Mild patchy opacity in both lower lobes, greater on the left. No lung nodules or enlarged lymph nodes. No pleural fluid. Thoracic spine degenerative changes. Review of the MIP images confirms the above findings. CTA ABDOMEN AND PELVIS FINDINGS Diffuse low density of the liver relative to the spleen. Cholecystectomy clips. Normal appearing pancreas.  1.0 cm upper splenic cyst. Minimal atheromatous arterial calcifications. No aortic aneurysm or dissection. No gastrointestinal abnormalities or enlarged lymph nodes. Normal appearing appendix. Normal appearing adrenal glands, kidneys, ureters and urinary bladder. No urinary tract calculi or hydronephrosis. Tiny umbilical hernia containing fat. Surgically absent uterus and ovaries. Lumbar spine degenerative changes. Review of the MIP images confirms the above findings. IMPRESSION: 1. No acute abnormality. Specifically, no aortic aneurysm or dissection. 2. Mild patchy bilateral lower lobe atelectasis, greater on the left. This does not have the typical appearance of pneumonia. 3. Diffuse hepatic steatosis. Electronically Signed   By: Claudie Revering M.D.   On: 10/18/2015 09:35    ASSESSMENT AND PLAN:    Active Problems:   * No active hospital problems. *  1. STEMI: Cath report to follow.   2. Hyperlipidemia: Will need lipid panel in the am.   3. History of diastolic CHF: Last echo was in Sept. 2016, EF was 55%, mild LVH.  She is on ARB, Lasix. Will need to add beta blocker post MI.    Signed: Arbutus Leas, NP 10/18/2015 12:41 PM Pager [phone]  Co-Sign MD  Agree with note by Jacqualin Combes NP  Mrs. Williford is a 60 year old female prior history of coronary artery bypass grafting 2010 at [facility]. She has a history of diabetes, hypertension and hyperlipidemia She developed chest pain this morning  and was brought to [facility] where her EKG showed inferior ST segment elevation. She was treated with IV heparin, nitroglycerin and aspirin and [facility].  Lorretta Harp, M.D., [place], [facility], [place] [address]  [address], [place]  [id number]  [phone] 10/19/2015 8:25 AM

## 2015-10-18 NOTE — ED Notes (Signed)
Report given to Fredia Beets at [facility] cath lab

## 2015-10-18 NOTE — ED Notes (Signed)
Patient returned from CT

## 2015-10-19 ENCOUNTER — Inpatient Hospital Stay (HOSPITAL_COMMUNITY): Payer: Medicare Other

## 2015-10-19 DIAGNOSIS — I25700 Atherosclerosis of coronary artery bypass graft(s), unspecified, with unstable angina pectoris: Secondary | ICD-10-CM

## 2015-10-19 DIAGNOSIS — I2119 ST elevation (STEMI) myocardial infarction involving other coronary artery of inferior wall: Principal | ICD-10-CM

## 2015-10-19 DIAGNOSIS — I10 Essential (primary) hypertension: Secondary | ICD-10-CM

## 2015-10-19 DIAGNOSIS — E1159 Type 2 diabetes mellitus with other circulatory complications: Secondary | ICD-10-CM

## 2015-10-19 LAB — BASIC METABOLIC PANEL
ANION GAP: 11 (ref 5–15)
BUN: 8 mg/dL (ref 6–20)
CALCIUM: 9 mg/dL (ref 8.9–10.3)
CO2: 19 mmol/L — ABNORMAL LOW (ref 22–32)
Chloride: 110 mmol/L (ref 101–111)
Creatinine, Ser: 1.02 mg/dL — ABNORMAL HIGH (ref 0.44–1.00)
GFR, EST NON AFRICAN AMERICAN: 59 mL/min — AB (ref >60)
GLUCOSE: 130 mg/dL — AB (ref 65–99)
Potassium: 5.3 mmol/L — ABNORMAL HIGH (ref 3.5–5.1)
Sodium: 140 mmol/L (ref 135–145)

## 2015-10-19 LAB — GLUCOSE, CAPILLARY
GLUCOSE-CAPILLARY: 136 mg/dL — AB (ref 65–99)
GLUCOSE-CAPILLARY: 119 mg/dL — AB (ref 65–99)
GLUCOSE-CAPILLARY: 147 mg/dL — AB (ref 65–99)
GLUCOSE-CAPILLARY: 135 mg/dL — AB (ref 65–99)

## 2015-10-19 LAB — CBC
HCT: 32.9 % — ABNORMAL LOW (ref 36.0–46.0)
HEMOGLOBIN: 11.1 g/dL — AB (ref 12.0–15.0)
MCH: 25.6 pg — ABNORMAL LOW (ref 26.0–34.0)
MCHC: 33.7 g/dL (ref 30.0–36.0)
MCV: 75.8 fL — ABNORMAL LOW (ref 78.0–100.0)
Platelets: 156 10*3/uL (ref 150–400)
RBC: 4.34 MIL/uL (ref 3.87–5.11)
RDW: 16.8 % — ABNORMAL HIGH (ref 11.5–15.5)
WBC: 11.3 10*3/uL — ABNORMAL HIGH (ref 4.0–10.5)

## 2015-10-19 LAB — TROPONIN I: TROPONIN I: 14.2 ng/mL — AB (ref <0.031)

## 2015-10-19 MED ORDER — CARVEDILOL 3.125 MG PO TABS
3.1250 mg | ORAL_TABLET | Freq: Two times a day (BID) | ORAL | Status: DC
  Administered 2015-10-19 – 2015-10-22 (×6): 3.125 mg via ORAL
  Filled 2015-10-19 (×6): qty 1

## 2015-10-19 MED ORDER — INSULIN ASPART 100 UNIT/ML ~~LOC~~ SOLN
0.0000 [IU] | Freq: Three times a day (TID) | SUBCUTANEOUS | Status: DC
Start: 2015-10-19 — End: 2015-10-22
  Administered 2015-10-19 – 2015-10-21 (×2): 3 [IU] via SUBCUTANEOUS

## 2015-10-19 MED ORDER — AMLODIPINE BESYLATE 10 MG PO TABS
10.0000 mg | ORAL_TABLET | Freq: Every day | ORAL | Status: DC
  Administered 2015-10-19 – 2015-10-22 (×4): 10 mg via ORAL
  Filled 2015-10-19 (×5): qty 1

## 2015-10-19 NOTE — Progress Notes (Signed)
Pt continues to report pain 8/10 in right groin area, abdomen and back.  No rigidity noted, no bruit present at site.  DP pulse weak but palpable.  Morphine per orders administered and Dr. Angelica Pou notified of continued pain.  Will cont to monitor closely.

## 2015-10-19 NOTE — Progress Notes (Signed)
CARDIOLOGY PROGRESS NOTE Subjective:   Felicia Woods is a 60 year old woman with morbid obesity, HTN, DM, CAD s/p 4 vessel CABG in 2010.   Admitted on 4/15 with inferior STEMI. Cath with severe 3-v native CAD. Found to have acutely occluded SVG-> RCA. All other grafts patent. S/p PCI and DES to SVG to RCA. Peak trop 14. EF 45-50% by V-gram  Feels better. No CP. Groin sore.    Intake/Output Summary (Last 24 hours) at 10/19/15 0929 Last data filed at 10/19/15 0900  Gross per 24 hour  Intake 1030.94 ml  Output   1250 ml  Net -219.06 ml    Current meds: . amLODipine  5 mg Oral Daily  . aspirin  81 mg Oral Daily  . atorvastatin  80 mg Oral q1800  . cyclobenzaprine  10 mg Oral QHS  . estradiol  0.5 mg Oral Daily  . furosemide  40 mg Oral Daily  . losartan  50 mg Oral Daily  . metFORMIN  250 mg Oral BID WC  . montelukast  10 mg Oral Daily  . raloxifene  60 mg Oral Daily  . sertraline  100 mg Oral Daily  . sodium chloride flush  3 mL Intravenous Q12H  . sulfamethoxazole-trimethoprim  1 tablet Oral BID  . ticagrelor  90 mg Oral BID  . zolpidem  5 mg Oral QHS   Infusions:     Objective:  Blood pressure 145/107, pulse 87, temperature 97.9 F (36.6 C), temperature source Oral, resp. rate 19, height 5\' 7"  (1.702 m), weight 115.2 kg (253 lb 15.5 oz), SpO2 92 %. Weight change:   Physical Exam: General:  Morbidly obese. In bed NAD. No resp difficulty HEENT: normal Neck: supple. JVP unable to assess. Carotids 2+ bilat; no bruits. No lymphadenopathy or thryomegaly appreciated. Cor: PMI nonpalpable. Regular rate & rhythm. No rubs, gallops or murmurs. Lungs: clear Abdomen: obese soft, nontender, nondistended. No bruits or masses. Good bowel sounds. Extremities: no cyanosis, clubbing, rash, edema. Groin with small ecchymosis. Mildly tender. No bruit Neuro: alert & orientedx3, cranial nerves grossly intact. moves all 4 extremities w/o difficulty. Affect pleasant  Telemetry:  NSR  Lab Results: Basic Metabolic Panel:  Recent Labs Lab 10/18/15 0744 10/18/15 1254 10/19/15 0235  NA 131* 139 140  K 2.8* 4.2 5.3*  CL 101 106 110  CO2 18*  --  19*  GLUCOSE 203* 161* 130*  BUN 11 9 8   CREATININE 1.01* 0.80 1.02*  CALCIUM 8.8*  --  9.0   Liver Function Tests:  Recent Labs Lab 10/18/15 0744  AST 23  ALT 15  ALKPHOS 59  BILITOT 0.7  PROT 7.3  ALBUMIN 3.8    Recent Labs Lab 10/18/15 0744  LIPASE 17   No results for input(s): AMMONIA in the last 168 hours. CBC:  Recent Labs Lab 10/18/15 0744 10/18/15 1254 10/19/15 0235  WBC 13.6*  --  11.3*  NEUTROABS 7.9*  --   --   HGB 11.9* 12.2 11.1*  HCT 36.2 36.0 32.9*  MCV 77.4*  --  75.8*  PLT 221  --  156   Cardiac Enzymes:  Recent Labs Lab 10/18/15 0744 10/18/15 1128 10/18/15 1235 10/18/15 1715 10/19/15 0235  TROPONINI <0.03 0.21* 0.35* 10.12* 14.20*   BNP: Invalid input(s): POCBNP CBG:  Recent Labs Lab 10/18/15 1425 10/18/15 1717  GLUCAP 157* 136*   Microbiology: No results found for: CULT No results for input(s): CULT, SDES in the last 168 hours.  Imaging: Dg Chest  Port 1 View  10/18/2015  CLINICAL DATA:  Chest pain since 6 a.m. today. EXAM: PORTABLE CHEST 1 VIEW COMPARISON:  06/12/2015. FINDINGS: Normal sized heart. Clear lungs with normal vascularity. Post CABG changes. Thoracic spine degenerative changes. IMPRESSION: No acute abnormality. Electronically Signed   By: Claudie Revering M.D.   On: 10/18/2015 08:12   Ct Angio Chest/abd/pel For Dissection W And/or W/wo  10/18/2015  CLINICAL DATA:  Chest pain since 6 a.m. today. Left arm numbness. Previous CABG. Previous cholecystectomy and hysterectomy. EXAM: CT ANGIOGRAPHY CHEST, ABDOMEN AND PELVIS TECHNIQUE: Multidetector CT imaging through the chest, abdomen and pelvis was performed using the standard protocol during bolus administration of intravenous contrast. Multiplanar reconstructed images and MIPs were obtained and  reviewed to evaluate the vascular anatomy. CONTRAST:  100 cc Isovue 370 COMPARISON:  Chest radiographs dated 06/12/2015. FINDINGS: CTA CHEST FINDINGS Post CABG changes. Normally opacified aorta and pulmonary arteries. No pulmonary arterial filling defects. No aortic dissection or aneurysm. Mild patchy opacity in both lower lobes, greater on the left. No lung nodules or enlarged lymph nodes. No pleural fluid. Thoracic spine degenerative changes. Review of the MIP images confirms the above findings. CTA ABDOMEN AND PELVIS FINDINGS Diffuse low density of the liver relative to the spleen. Cholecystectomy clips. Normal appearing pancreas.  1.0 cm upper splenic cyst. Minimal atheromatous arterial calcifications. No aortic aneurysm or dissection. No gastrointestinal abnormalities or enlarged lymph nodes. Normal appearing appendix. Normal appearing adrenal glands, kidneys, ureters and urinary bladder. No urinary tract calculi or hydronephrosis. Tiny umbilical hernia containing fat. Surgically absent uterus and ovaries. Lumbar spine degenerative changes. Review of the MIP images confirms the above findings. IMPRESSION: 1. No acute abnormality. Specifically, no aortic aneurysm or dissection. 2. Mild patchy bilateral lower lobe atelectasis, greater on the left. This does not have the typical appearance of pneumonia. 3. Diffuse hepatic steatosis. Electronically Signed   By: Claudie Revering M.D.   On: 10/18/2015 09:35     ASSESSMENT:  1. Inferior STEMI   --s/p PCI with DES to SVG->RCA on 4/15. Peak trop 14.  2. CAD s/p CABG   --severe 3v CAD with patent grafts except for SVG->RCA which was intervened upon 3. Ischemic cardiomyopathy   --EF 45-50% by V-gram 4. Morbid obesity 5. HTN   --elevated. Adding carvedilol. Titrate amlodipine 6. Hyperkalemia 7. DM2  PLAN/DISCUSSION:  Doing well post-STEMI. Will progress to tele. Consult CR. Watch H/H.   Continue ASA, Brilinta, statin. Add b-blocker. Hold metformin  post-cath.     LOS: 1 day    Glori Bickers, MD 10/19/2015, 9:29 AM

## 2015-10-19 NOTE — Progress Notes (Signed)
Utilization review completed.  

## 2015-10-19 NOTE — Progress Notes (Signed)
Asked by RN to evaluate pt with increasing rt groin and abd/back pain on rt.  Femoral artery stick yesterday for cardiac cath.  Also pulse rt foot is < lt yesterday 2 + foot same warmth and no pain.  Will send for non contrast abd and pelvic CT to rule out retroperitoneal bleed.  I did not palpate mass but pt very tender.

## 2015-10-20 ENCOUNTER — Encounter (HOSPITAL_COMMUNITY): Payer: Self-pay | Admitting: Cardiovascular Disease

## 2015-10-20 DIAGNOSIS — E785 Hyperlipidemia, unspecified: Secondary | ICD-10-CM

## 2015-10-20 DIAGNOSIS — I5041 Acute combined systolic (congestive) and diastolic (congestive) heart failure: Secondary | ICD-10-CM

## 2015-10-20 DIAGNOSIS — E669 Obesity, unspecified: Secondary | ICD-10-CM

## 2015-10-20 DIAGNOSIS — E119 Type 2 diabetes mellitus without complications: Secondary | ICD-10-CM

## 2015-10-20 LAB — BASIC METABOLIC PANEL
Anion gap: 10 (ref 5–15)
BUN: 7 mg/dL (ref 6–20)
CO2: 26 mmol/L (ref 22–32)
Calcium: 8.9 mg/dL (ref 8.9–10.3)
Chloride: 102 mmol/L (ref 101–111)
Creatinine, Ser: 1.06 mg/dL — ABNORMAL HIGH (ref 0.44–1.00)
GFR calc non Af Amer: 57 mL/min — ABNORMAL LOW (ref >60)
Glucose, Bld: 106 mg/dL — ABNORMAL HIGH (ref 65–99)
Potassium: 3.3 mmol/L — ABNORMAL LOW (ref 3.5–5.1)
SODIUM: 138 mmol/L (ref 135–145)

## 2015-10-20 LAB — GLUCOSE, CAPILLARY
GLUCOSE-CAPILLARY: 105 mg/dL — AB (ref 65–99)
GLUCOSE-CAPILLARY: 144 mg/dL — AB (ref 65–99)
Glucose-Capillary: 97 mg/dL (ref 65–99)
Glucose-Capillary: 96 mg/dL (ref 65–99)

## 2015-10-20 LAB — CBC
HEMATOCRIT: 32.4 % — AB (ref 36.0–46.0)
Hemoglobin: 10.3 g/dL — ABNORMAL LOW (ref 12.0–15.0)
MCH: 24.9 pg — AB (ref 26.0–34.0)
MCHC: 31.8 g/dL (ref 30.0–36.0)
MCV: 78.3 fL (ref 78.0–100.0)
Platelets: 205 10*3/uL (ref 150–400)
RBC: 4.14 MIL/uL (ref 3.87–5.11)
RDW: 17.2 % — ABNORMAL HIGH (ref 11.5–15.5)
WBC: 8 10*3/uL (ref 4.0–10.5)

## 2015-10-20 LAB — POCT I-STAT TROPONIN I: Troponin i, poc: 0.61 ng/mL (ref 0.00–0.08)

## 2015-10-20 LAB — LIPID PANEL
CHOL/HDL RATIO: 4.9 ratio
CHOLESTEROL: 153 mg/dL (ref 0–200)
HDL: 31 mg/dL — AB (ref >40)
LDL Cholesterol: 97 mg/dL (ref 0–99)
Triglycerides: 123 mg/dL (ref <150)
VLDL: 25 mg/dL (ref 0–40)

## 2015-10-20 MED ORDER — MECLIZINE HCL 25 MG PO TABS
12.5000 mg | ORAL_TABLET | Freq: Three times a day (TID) | ORAL | Status: DC | PRN
  Administered 2015-10-20 – 2015-10-21 (×2): 12.5 mg via ORAL
  Filled 2015-10-20 (×2): qty 1

## 2015-10-20 MED ORDER — HYDROCODONE-ACETAMINOPHEN 5-325 MG PO TABS
1.0000 | ORAL_TABLET | Freq: Four times a day (QID) | ORAL | Status: DC
  Administered 2015-10-20 – 2015-10-22 (×8): 1 via ORAL
  Filled 2015-10-20 (×8): qty 1

## 2015-10-20 MED FILL — Tirofiban HCl in NaCl 0.9% IV Soln 5 MG/100ML (Base Equiv): INTRAVENOUS | Qty: 100 | Status: AC

## 2015-10-20 MED FILL — Nitroglycerin IV Soln 100 MCG/ML in D5W: INTRA_ARTERIAL | Qty: 10 | Status: AC

## 2015-10-20 NOTE — Progress Notes (Signed)
PATIENT ID: [id number] with CAD status post CABG, hypertension, hyperlipidemia, and diabetes here with ST elevation myocardial infarction status post PCI of the SVG to RCA.   INTERVAL HISTORY: Pain in right groins so she underwent CT to rule out RP bleed.  CT was unremarkable other than coronary calcification.   SUBJECTIVE:  Felicia Woods continues to complain complaining of a in her right groin radiating to her back.  The pain is alleviated with morphine but not by Tylenol. She did ambulate yesterday and denied chest pain or shortness of breath but did have some dizziness.   PHYSICAL EXAM Filed Vitals:   10/19/15 1512 10/19/15 1748 10/19/15 2153 10/20/15 0458  BP: 102/58 112/66 140/78 110/69  Pulse: 92 89 93   Temp: 98.1 F (36.7 C)  98.8 F (37.1 C) 98.9 F (37.2 C)  TempSrc: Oral  Oral Oral  Resp: 16  20 18   Height:      Weight:    113.127 kg (249 lb 6.4 oz)  SpO2: 97%  95% 94%   General:  Well-appearing. No acute distress. Neck:  no JVD.  Lungs:  clear to auscultation bilaterally. No crackles, rhonchi, or wheezes. Heart: Regular rate and rhythm. No murmurs, rubs, or gallops. Normal S1/S2. Abdomen:  Soft. Nontender, nondistended. Active bowel sounds.  Extremities: Warm and well-perfused. Right groin without ecchymosis, hematoma, or bruit.  1+ right DP/TP. 2+ left DP/TP.  LABS: Lab Results  Component Value Date   TROPONINI 14.20* 10/19/2015   Results for orders placed or performed during the hospital encounter of 10/18/15 (from the past 24 hour(s))  Glucose, capillary     Status: Abnormal   Collection Time: 10/19/15 12:19 PM  Result Value Ref Range   Glucose-Capillary 119 (H) 65 - 99 mg/dL  Glucose, capillary     Status: Abnormal   Collection Time: 10/19/15  5:03 PM  Result Value Ref Range   Glucose-Capillary 147 (H) 65 - 99 mg/dL  Glucose, capillary     Status: Abnormal   Collection Time: 10/19/15  9:51 PM  Result Value Ref Range   Glucose-Capillary 135 (H) 65 - 99 mg/dL   Comment 1 Notify RN    Comment 2 Document in Chart   Basic metabolic panel     Status: Abnormal   Collection Time: 10/20/15  4:00 AM  Result Value Ref Range   Sodium 138 135 - 145 mmol/L   Potassium 3.3 (L) 3.5 - 5.1 mmol/L   Chloride 102 101 - 111 mmol/L   CO2 26 22 - 32 mmol/L   Glucose, Bld 106 (H) 65 - 99 mg/dL   BUN 7 6 - 20 mg/dL   Creatinine, Ser 1.06 (H) 0.44 - 1.00 mg/dL   Calcium 8.9 8.9 - 10.3 mg/dL   GFR calc non Af Amer 57 (L) >60 mL/min   GFR calc Af Amer >60 >60 mL/min   Anion gap 10 5 - 15  CBC     Status: Abnormal   Collection Time: 10/20/15  4:00 AM  Result Value Ref Range   WBC 8.0 4.0 - 10.5 K/uL   RBC 4.14 3.87 - 5.11 MIL/uL   Hemoglobin 10.3 (L) 12.0 - 15.0 g/dL   HCT 32.4 (L) 36.0 - 46.0 %   MCV 78.3 78.0 - 100.0 fL   MCH 24.9 (L) 26.0 - 34.0 pg   MCHC 31.8 30.0 - 36.0 g/dL   RDW 17.2 (H) 11.5 - 15.5 %   Platelets 205 150 - 400 K/uL  Glucose, capillary  Status: None   Collection Time: 10/20/15  7:38 AM  Result Value Ref Range   Glucose-Capillary 97 65 - 99 mg/dL   Comment 1 Notify RN    Comment 2 Document in Chart     Intake/Output Summary (Last 24 hours) at 10/20/15 0757 Last data filed at 10/20/15 0222  Gross per 24 hour  Intake    120 ml  Output   1200 ml  Net  -1080 ml    Telemetry: Sinus rhythm and sinus tachycardia. Occasional PVCs.  ASSESSMENT AND PLAN:  Active Problems:   ST elevation myocardial infarction (STEMI) ([place])   STEMI (ST elevation myocardial infarction) ([place])   # CAD s/p PCI or SVG to RCA: Felicia Woods underwent successful PCI of the vein graft to the RCA. She currently denies chest pain or shortness of breath with ambulation. She will need referral for cardiac rehabilitation.  Continue aspirin, ticagrelor, carvedilol and atorvastatin.  She reports pain in her groin and back after right   groin catheterization.  The site appears stable. Her H&H is stable. She had a CT scan that was negative for retroperitoneal be lead or  other pathology. Her right DP and TP pulses are diminished compared to the left but she appears to have adequate circulation. I suspect that her discomfort is nerve related.  We will provide a short course of Vicodin at discharge, and Tylenol has not been effective for management. # Hypertension: BP well-controlled. Continue amlodipine, losartan, carvedilol  # Chronic systolic and diastolic heart failure: LVEF 45-50% on LV gram with hypokinesis of the basal inferior wall.  EF was 55% 03/2015.  Continue carvedilol, losartan and lasix.  # DM:  glucose well-controlled. Continue current insulin regimen and resume home regimen on discharge.   # HL: Continue atorvastatin.  She was on simvastatin at admission.  Added fasting lipid panel to assess for control.   # Dispo: Home today with short period of pain control.  Time spent.inutes-Greater than 50% of this time was spent in counseling, explanation of diagnosis, planning of further management, and coordination of care.    Brena Windsor C. Oval Linsey, MD, [facility] 10/20/2015 7:57 AM

## 2015-10-20 NOTE — Progress Notes (Signed)
The patient reports she gets very dizzy and the room spins when she stands up.  Will start meclizine 12.5 TID PRN.  She said she had a problem with this about 5 years ago.    Emaan Gary, PAC

## 2015-10-20 NOTE — Progress Notes (Signed)
CARDIAC REHAB PHASE I   PRE:  Rate/Rhythm: 87 SR  BP:  Supine:   Sitting: 107/77  Standing: 94/72   SaO2: 97%RA  MODE:  Ambulation: 0 ft  :  Supine: 112/71  Sitting: 108/77  Standing:    SaO2:  1025-1128 Pt stated she has walked twice to bathroom and dizzy both times. BP standing at 94/72 and pt stated very lightheaded. Had pt sit on side of bed while I began education and she sat with eyes closed. When asked about it, pt stated she felt very dizzy just sitting on side of bed with BP at 108/77. Helped pt get back sitting in bed with head elevated and BP 112/71. Could not walk pt safely with the dizziness and lower BP standing. Completed MI education except for walking instructions because of the dizziness. Reviewed MI restrictions, stent and purpose of brilinta, NTG use, CHF signs and symptoms. Gave pt CHF booklet and reviewed zones. Encouraged daily weights and 2000 mg sodium restriction. Stressed importance of brilinta with stent and case manager is going to see to give brilinta card.  Gave pt diabetic, heart healthy and low sodium diets. Pt stated that her doctor was happy with her diabetes management. Reviewed carb counting with pt and watching sodium. Discussed CRP 2 and pt stated has done before. Will refer to [facility].  Would encourage staff to try to walk later if not dizzy..  Will follow up tomorrow.   Graylon Good, RN BSN  10/20/2015 11:17 AM

## 2015-10-20 NOTE — Care Management Note (Addendum)
Case Management Note  Patient Details  Name: Felicia Woods MRN: [id number] Date of Birth: Nov 24, 1955  Subjective/Objective:  Pt admitted for [facility]. Plan to d/c on Brilinta.                   Action/Plan: Co pay for Brilinta is $3.70. 30 day free card given to patient. No further needs from CM at this time.    Expected Discharge Date:  10/21/15               Expected Discharge Plan:  Home/Self Care  In-House Referral:  NA  Discharge planning Services  CM Consult, Medication Assistance  Post Acute Care Choice: Home Health Services/ Durable Medical Equipment Choice offered to:  Patient  DME Arranged: RW DME Agency:  [facility]  HH Arranged:  Physical Therapy HH Agency:  [facility]  Status of Service:  Completed, signed off  Medicare Important Message Given:    Date Medicare IM Given:    Medicare IM give by:    Date Additional Medicare IM Given:    Additional Medicare Important Message give by:     If discussed at [place] of Stay Meetings, dates discussed:    Additional Comments: 1424 10-22-15 Jacqlyn Krauss, RN,BSN [phone] Choice Offered for RW and pt chose [facility]- Referral made and RW to be delivered to room. [facility] to be via [place] via choice. SOC to begin within 24-48 hours post d/c. Pt has Brilinta card. No further needs from CM at this time.   Bethena Roys, RN 10/20/2015, 2:09 PM

## 2015-10-21 DIAGNOSIS — R42 Dizziness and giddiness: Secondary | ICD-10-CM

## 2015-10-21 LAB — GLUCOSE, CAPILLARY
GLUCOSE-CAPILLARY: 93 mg/dL (ref 65–99)
GLUCOSE-CAPILLARY: 129 mg/dL — AB (ref 65–99)
Glucose-Capillary: 93 mg/dL (ref 65–99)
Glucose-Capillary: 106 mg/dL — ABNORMAL HIGH (ref 65–99)

## 2015-10-21 MED ORDER — POTASSIUM CHLORIDE CRYS ER 20 MEQ PO TBCR
40.0000 meq | EXTENDED_RELEASE_TABLET | Freq: Once | ORAL | Status: AC
  Administered 2015-10-21: 40 meq via ORAL
  Filled 2015-10-21: qty 2

## 2015-10-21 MED ORDER — MECLIZINE HCL 25 MG PO TABS
25.0000 mg | ORAL_TABLET | Freq: Three times a day (TID) | ORAL | Status: DC | PRN
  Administered 2015-10-21: 12.5 mg via ORAL
  Filled 2015-10-21: qty 1

## 2015-10-21 NOTE — Progress Notes (Signed)
CARDIAC REHAB PHASE I   PRE:  Rate/Rhythm: 71 SR    BP: sitting 105/63    SaO2: 100 RA  MODE:  Ambulation: 100 ft   POST:  Rate/Rhythm: 72 SR    BP: sitting 116/67     SaO2: 99 RA  Pt continues to have lightheadedness/dizziness with activity. Seems to "calm down" with sitting but increased with standing and walking. Had to sit to rest x3 then roll back to room in recliner after her right leg began jerking. Left pt in recliner. BP stable (was 115/70 in hall walking). Pt doesn't feet well and sts she is not going home until she does. Her husband works during the day. Suggest PT vestibular eval. Notified RN.  [id number]  [place], ACSM 10/21/2015 11:11 AM

## 2015-10-21 NOTE — Evaluation (Signed)
Physical Therapy Vestibular Evaluation Patient Details Name: Felicia Woods MRN: [id number] DOB: [birth year] Today's Date: 10/21/2015   History of Present Illness  Patient is a 60 y/o female admitted with STEMI s/p stent on 10/18/14.  Previous history of CABG in 2010 due to CAD, also with HTN and DM.  Patient developed symptoms of dizziness since her procedure on 4/15 and reports ringing in R ear and up to 7/10 dizziness despite being started on Meclizine.  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  In addition exhibits R LE buckling with ambulation and was unable to negotiate steps today to ensure safe home entry.  Patient educated on compensatory techniques for mobility and initiated gaze stabilization exercises.  Feel she may be able to attempt to negotiate stairs tomorrow in hopes of d/c home with follow up HHPT for vestibular rehab.    Follow Up Recommendations Home health PT;Supervision - Intermittent    Equipment Recommendations  Rolling walker with 5" wheels    Recommendations for Other Services       Precautions / Restrictions Precautions Precautions: Fall      Mobility  Bed Mobility Overal bed mobility: Modified Independent             General bed mobility comments: wanted to elevated HOB instead of lying flat, asker her to lie flat and pt c/o increased dizziness but no nystagmus seen  Transfers Overall transfer level: Needs assistance Equipment used: Rolling walker (2 wheeled) Transfers: Sit to/from Stand Sit to Stand: Min guard         General transfer comment: cues to push up from chair, but pt pulled up on walker  Ambulation/Gait Ambulation/Gait assistance: Min assist Ambulation Distance (Feet): 80 Feet Assistive device: Rolling walker (2 wheeled) Gait Pattern/deviations: Step-through pattern;Decreased stride length     General Gait  Details: slow pace, cues for compensation strategies due to dizziness, R LE buckled 3 times during gait, standing rest with cues for using UE support to compensate for R LE weakness  Stairs            Wheelchair Mobility    Modified Rankin (Stroke Patients Only)       Balance Overall balance assessment: Needs assistance   Sitting balance-Leahy Scale: Fair     Standing balance support: Bilateral upper extremity supported Standing balance-Leahy Scale: Poor Standing balance comment: UE support needed for balance                             Pertinent Vitals/Pain Pain Assessment: No/denies pain    Home Living Family/patient expects to be discharged to:: Private residence Living Arrangements: Spouse/significant other (fiance) Available Help at Discharge: Family;Available PRN/intermittently Type of Home: Apartment Home Access: Stairs to enter Entrance Stairs-Rails: Chemical engineer of Steps: flight (18) Home Layout: One level Home Equipment: Grab bars - tub/shower      Prior Function Level of Independence: Independent         Comments: didn't drive much or do grocery shopping, limiited ambulation due to "heart trouble"     Hand Dominance        Extremity/Trunk Assessment               Lower Extremity Assessment: RLE deficits/detail;LLE deficits/detail RLE Deficits / Details: AROM WFL, strength grossly 4+/5 except hip flexion 3+/5 and shaky LLE Deficits / Details: AROM WFL, strength grossly 4+/5  except hip flexion 4-/5, reports numbness in L LE about 5 years     Communication   Communication: No difficulties  Cognition Arousal/Alertness: Awake/alert Behavior During Therapy: WFL for tasks assessed/performed Overall Cognitive Status: Within Functional Limits for tasks assessed                      General Comments  Vestibular Assessment    09-Nov-2015 0001  Symptom Behavior  Type of Dizziness Spinning   Frequency of Dizziness upon standing, moving head/eyes  Duration of Dizziness minutes  Aggravating Factors Sit to stand;Turning head quickly;Moving eyes;Activity in general  Relieving Factors Closing eyes;Head stationary  Occulomotor Exam  Occulomotor Alignment Normal (but reports double vision, L eye slightly lower in orbit )  Spontaneous Absent  Gaze-induced Absent  Smooth Pursuits Intact (but exacerbate symptoms)  Saccades Intact (but exacerbate symptoms)  Vestibulo-Occular Reflex  VOR 1 Head Only (x 1 viewing) intact with horizontal head movements, but exacerbate symptoms up to 7/10  VOR to Slow Head Movement Comment (unable to assess due to pt holding neck too stiff )  VOR Cancellation Normal  Auditory  Comments intact with scratch test and equal except ringing in R ear      Exercises Other Exercises Other Exercises: Performed seated VOR horizontal x 5 head turns prior to closing eyes educated to perform for HEP with handout to be given      Assessment/Plan    PT Assessment Patient needs continued PT services  PT Diagnosis Abnormality of gait;Other (comment) (Peripheral vertigo)   PT Problem List Decreased balance;Decreased mobility;Decreased strength;Decreased knowledge of use of DME;Decreased safety awareness;Other (comment) (dizziness)  PT Treatment Interventions DME instruction;Gait training;Stair training;Neuromuscular re-education;Balance training;Functional mobility training;Patient/family education;Therapeutic activities;Therapeutic exercise (Vestibular rehab)   PT Goals (Current goals can be found in the Care Plan section) Acute Rehab PT Goals Patient Stated Goal: To go home with home health PT Goal Formulation: With patient Time For Goal Achievement: 10/28/15 Potential to Achieve Goals: Good    Frequency Min 4X/week   Barriers to discharge Decreased caregiver support fiance works    Co-evaluation               End of Session Equipment Utilized  During Treatment: Gait belt Activity Tolerance: Other (comment) (limited due to vertigo symptoms) Patient left: in bed;with call bell/phone within reach           Time: 1224-1305 PT Time Calculation (min) (ACUTE ONLY): 41 min   Charges:   PT Evaluation $PT Eval Moderate Complexity: 1 Procedure PT Treatments $Gait Training: 8-22 mins $Neuromuscular Re-education: 8-22 mins   PT G Codes:        Reginia Naas Nov 09, 2015, 1:43 PM  Magda Kiel, [place] 2015-11-09

## 2015-10-21 NOTE — Care Management Important Message (Signed)
Important Message  Patient Details  Name: Felicia Woods MRN: [id number] Date of Birth: August 07, 1955   Medicare Important Message Given:  Yes    Nathen May 10/21/2015, 11:06 AM

## 2015-10-21 NOTE — Progress Notes (Signed)
RN asked patient if she would like to be weighed now or wait until the next time she needed to use the bathroom.  Patient stated she wanted to be weighed the next time she needed to get out of bed for the bathroom.

## 2015-10-21 NOTE — Patient Instructions (Signed)
Gaze Stabilization: Sitting    Keeping eyes on target on wall 5 feet away, tilt head down 15-30 and move head side to side for _30___ seconds. Repeat while moving head up and down for _30___ seconds. Do __4__ sessions per day. Repeat using target on pattern background.  Copyright  VHI. All rights reserved.  Gaze Stabilization: Tip Card  1.Target must remain in focus, not blurry, and appear stationary while head is in motion. 2.Perform exercises with small head movements (45 to either side of midline). 3.Increase speed of head motion so long as target is in focus. 4.If you wear eyeglasses, be sure you can see target through lens (therapist will give specific instructions for bifocal / progressive lenses). 5.These exercises may provoke dizziness or nausea. Work through these symptoms. If too dizzy, slow head movement slightly. Rest between each exercise. 6.Exercises demand concentration; avoid distractions. 7.For safety, perform standing exercises close to a counter, wall, corner, or next to someone.  Copyright  VHI. All rights reserved.

## 2015-10-21 NOTE — Progress Notes (Signed)
CSW consulted for letter from hospital to explain her absence from her court date tomorrow.  CSW wrote letter faxed to provided fax number  CSW informed pt- No further CSW needs at this time  Domenica Reamer, [place] Social Worker [phone]

## 2015-10-21 NOTE — Plan of Care (Signed)
Problem: Cardiac: Goal: Vascular access site(s) Level 0-1 will be maintained Outcome: Progressing Right groin site level 1 due to mild bruising.

## 2015-10-21 NOTE — Progress Notes (Signed)
Subjective: She continues to complain to vertigo, dizziness, and now nausea and vomiting  Objective: Vital signs in last 24 hours: Temp:  [97.9 F (36.6 C)-98.8 F (37.1 C)] 97.9 F (36.6 C) (04/18 0445) Pulse Rate:  [62-84] 68 (04/18 0445) Resp:  [14-18] 14 (04/18 0445) BP: (95-111)/(55-71) 107/71 mmHg (04/18 0445) SpO2:  [95 %-99 %] 99 % (04/18 0445) Weight:  [249 lb 11.2 oz (113.263 kg)] 249 lb 11.2 oz (113.263 kg) (04/18 0657) Last BM Date: 10/20/15  Intake/Output from previous day: 04/17 0701 - 04/18 0700 In: 1020 [P.O.:1020] Out: 800 [Urine:800] Intake/Output this shift:    Medications Scheduled Meds: . amLODipine  10 mg Oral Daily  . aspirin  81 mg Oral Daily  . atorvastatin  80 mg Oral q1800  . carvedilol  3.125 mg Oral BID WC  . cyclobenzaprine  10 mg Oral QHS  . estradiol  0.5 mg Oral Daily  . furosemide  40 mg Oral Daily  . HYDROcodone-acetaminophen  1 tablet Oral Q6H  . insulin aspart  0-20 Units Subcutaneous TID WC  . losartan  50 mg Oral Daily  . montelukast  10 mg Oral Daily  . raloxifene  60 mg Oral Daily  . sertraline  100 mg Oral Daily  . sodium chloride flush  3 mL Intravenous Q12H  . sulfamethoxazole-trimethoprim  1 tablet Oral BID  . ticagrelor  90 mg Oral BID  . zolpidem  5 mg Oral QHS   Continuous Infusions:  PRN Meds:.sodium chloride, acetaminophen, albuterol, meclizine, morphine injection, nitroGLYCERIN, ondansetron (ZOFRAN) IV, sodium chloride flush  PE: General appearance: alert, cooperative and no distress Lungs: clear to auscultation bilaterally Heart: regular rate and rhythm Abdomen: +BS, Nontender Extremities: No LEE Pulses: 2+ and symmetric Skin: Warm and dry Neurologic: Grossly normal  Lab Results:   Recent Labs  10/18/15 1254 10/19/15 0235 10/20/15 0400  WBC  --  11.3* 8.0  HGB 12.2 11.1* 10.3*  HCT 36.0 32.9* 32.4*  PLT  --  156 205   BMET  Recent Labs  10/18/15 1254 10/19/15 0235 10/20/15 0400  NA 139  140 138  K 4.2 5.3* 3.3*  CL 106 110 102  CO2  --  19* 26  GLUCOSE 161* 130* 106*  BUN 9 8 7   CREATININE 0.80 1.02* 1.06*  CALCIUM  --  9.0 8.9   PT/INR No results for input(s): LABPROT, INR in the last 72 hours. Cholesterol  Recent Labs  10/20/15 0400  CHOL 153   Lipid Panel     Component Value Date/Time   CHOL 153 10/20/2015 0400   TRIG 123 10/20/2015 0400   HDL 31* 10/20/2015 0400   CHOLHDL 4.9 10/20/2015 0400   VLDL 25 10/20/2015 0400   LDLCALC 97 10/20/2015 0400     Assessment/Plan 9F with CAD status post CABG, hypertension, hyperlipidemia, and diabetes here with ST elevation myocardial infarction status post PCI of the SVG to RCA.   CAD s/p PCI or SVG to RCA:  Felicia Woods underwent successful PCI of the vein graft to the RCA. She will need referral for cardiac rehabilitation. Continue aspirin, ticagrelor, carvedilol and atorvastatin. She reports pain in her groin and back after right groin catheterization. The site appears stable. Her H&H is stable. She had a CT scan that was negative for retroperitoneal bleed or other pathology.  Per Dr. Oval Linsey her discomfort is likely nerve related. We will provide a short course of Vicodin at discharge, and Tylenol has not been effective for management.  Hypertension: BP well-controlled. Continue amlodipine, losartan, carvedilol   Chronic systolic and diastolic heart failure: LVEF 45-50% on LV gram with hypokinesis of the basal inferior wall. EF was 55% 03/2015. Continue carvedilol, losartan and lasix.   DM: glucose well-controlled. Continue current insulin regimen and resume home regimen on discharge.    HL: Continue atorvastatin. She was on simvastatin at admission. Added fasting lipid panel to assess for control.    Dispo: Maybe home later today.     Dizziness/vertigo  The patient complain of dizziness and room spinning each time she tried to ambulate yesterday and it continues this morning.  I prescribed  meclizine and she had two doses.  Increase to 25mg  and give zofran as needed.    Hypokalemia  Replace this morning   LOS: 3 days    Felicia Jurado PA-C 10/21/2015 7:51 AM

## 2015-10-22 DIAGNOSIS — R42 Dizziness and giddiness: Secondary | ICD-10-CM | POA: Insufficient documentation

## 2015-10-22 DIAGNOSIS — Z955 Presence of coronary angioplasty implant and graft: Secondary | ICD-10-CM

## 2015-10-22 DIAGNOSIS — I119 Hypertensive heart disease without heart failure: Secondary | ICD-10-CM | POA: Insufficient documentation

## 2015-10-22 DIAGNOSIS — E785 Hyperlipidemia, unspecified: Secondary | ICD-10-CM | POA: Insufficient documentation

## 2015-10-22 LAB — GLUCOSE, CAPILLARY
GLUCOSE-CAPILLARY: 90 mg/dL (ref 65–99)
Glucose-Capillary: 103 mg/dL — ABNORMAL HIGH (ref 65–99)

## 2015-10-22 LAB — BASIC METABOLIC PANEL
Anion gap: 12 (ref 5–15)
BUN: 11 mg/dL (ref 6–20)
CALCIUM: 9.1 mg/dL (ref 8.9–10.3)
CO2: 24 mmol/L (ref 22–32)
CREATININE: 0.96 mg/dL (ref 0.44–1.00)
Chloride: 101 mmol/L (ref 101–111)
GFR calc non Af Amer: >60 mL/min (ref >60)
Glucose, Bld: 82 mg/dL (ref 65–99)
Potassium: 4.2 mmol/L (ref 3.5–5.1)
SODIUM: 137 mmol/L (ref 135–145)

## 2015-10-22 MED ORDER — AMLODIPINE BESYLATE 10 MG PO TABS: 10.0000 mg | ORAL_TABLET | Freq: Every day | ORAL | Status: DC

## 2015-10-22 MED ORDER — CARVEDILOL 3.125 MG PO TABS: 3.1250 mg | ORAL_TABLET | Freq: Two times a day (BID) | ORAL | Status: DC

## 2015-10-22 MED ORDER — ATORVASTATIN CALCIUM 80 MG PO TABS: 80.0000 mg | ORAL_TABLET | Freq: Every day | ORAL | Status: DC

## 2015-10-22 MED ORDER — MECLIZINE HCL 25 MG PO TABS: 25.0000 mg | ORAL_TABLET | Freq: Three times a day (TID) | ORAL | Status: DC | PRN

## 2015-10-22 MED ORDER — TICAGRELOR 90 MG PO TABS: 90.0000 mg | ORAL_TABLET | Freq: Two times a day (BID) | ORAL | Status: DC

## 2015-10-22 NOTE — Discharge Summary (Signed)
Discharge Summary    Patient ID: Felicia Woods,  MRN: [id number], DOB/AGE: 09/03/1955 60 y.o.  Admit date: 10/18/2015 Discharge date: 10/22/2015  Primary Care Provider: Lavera Guise Primary Cardiologist: Dr. Nehemiah Massed at [facility]  Discharge Diagnoses    Principal Problem:   STEMI (ST elevation myocardial infarction) [facility]) Active Problems:   ST elevation myocardial infarction (STEMI) [facility])   Stented coronary artery   Hypertensive heart disease without heart failure   Vertigo   Hyperlipidemia   Vertigo   Hypokalemia   Allergies Allergies  Allergen Reactions  . Penicillin G Anaphylaxis and Other (See Comments)    Has patient had a PCN reaction causing immediate rash, facial/tongue/throat swelling, SOB or lightheadedness with hypotension: [id number] Has patient had a PCN reaction causing severe rash involving mucus membranes or skin necrosis: no:[id number]} Has patient had a PCN reaction that required hospitalization no:[id number]} Has patient had a PCN reaction occurring within the last 10 years: no:[id number]} If all of the above answers are "NO", then may proceed with Cephalosporin use.    Diagnostic Studies/Procedures    Coronary Stent Intervention    Left Heart Cath and Cors/Grafts Angiography    Conclusion     Prox RCA lesion, 95% stenosed.  Dist RCA lesion, 100% stenosed.  Ost Cx to Prox Cx lesion, 90% stenosed.  Ramus-1 lesion, 90% stenosed.  Ramus-2 lesion, 100% stenosed.  Mid LAD to Dist LAD lesion, 90% stenosed.  Prox Graft lesion, 100% stenosed. Post intervention, there is a 0% residual stenosis.  Origin lesion, 50% stenosed.  There is mild left ventricular systolic dysfunction.    IMPRESSION:Successful aspiration thrombectomy, PCI and drug-eluting stenting of an occluded RCA SVG of an inferior STEMI The patient did receive a bolus of Aggrastatas well as aspirin and Proventil. She received weight based bivalirudin infusion which will  be continued for 4 hoursat full Dose.She probably can be "fast tracted'.   Coronary Findings    Dominance: Right   Left Anterior Descending   . Mid LAD to Dist LAD lesion, 90% stenosed.     Ramus Intermedius   . Ramus-1 lesion, 90% stenosed.   . Ramus-2 lesion, 100% stenosed.     Left Circumflex   . Ost Cx to Prox Cx lesion, 90% stenosed.     Right Coronary Artery   . Prox RCA lesion, 95% stenosed.   . Dist RCA lesion, 100% stenosed.     Graft Angiography    Graft to Ost RPDA   . Origin lesion, 50% stenosed.   . Prox Graft lesion, 100% stenosed.   Marland Kitchen PCI: An unspecified stent was placed.  . There is no residual stenosis post intervention.       Graft to Ramus     Graft to 2nd Diag     LIMA Graft to Dist LAD          Wall Motion                 Left Heart    Left Ventricle The left ventricular size is normal. There is mild left ventricular systolic dysfunction. The left ventricular ejection fraction is 45-50% by visual estimate. There are wall motion abnormalities in the left ventricle. There was moderate inferobasal hypokinesia    Coronary Diagrams    Diagnostic Diagram           Post-Intervention Diagram                History of Present Illness  [name] is a 60 year old married female with a past medical history of HTN, DM, CAD s/p 4 vessel CABG in 2010.   She was awaken from sleep at 6 am 10/18/15  with sharp, stabbing chest pain that radiated to her left arm. She got up to walk to the bathroom and felt SOB, nauseous and became profusely diaphoretic. She was brought to [facility] ED. Her EKG showed ST depression in leads V1-V3, and ST elevation in leads III and AVF. First troponin was negative, second was 0.21.   She has had a previous MI, and states that the pain today was much worse than with her previous MI. She has not had a cardiac cath since her CABG that she can remember. She does not have  any stents. Last echo was in Sept. 2016, EF was 55%, mild LVH.  She was brought emergently to [facility] cath lab for code STEMI. She was treated with IV heparin, nitroglycerin and aspirin.   Hospital Course     Consultants: None  Admitted on 4/15 with inferior STEMI. Cath with severe 3-v native CAD. Found to have acutely occluded SVG-> RCA. All other grafts patent. S/p PCI and DES to SVG to RCA. Peak trop 14. EF 45-50% by V-gram.   CAD s/p PCI or SVG to RCA: Ms. Bailor underwent successful PCI of the vein graft to the RCA. She will need referral for cardiac rehabilitation.Post cath the patient reported  pain in her Right groin (cath site). Subsequently she had a CT scan that was negative for retroperitoneal bleed or other pathology. Per Dr. Oval Linsey her discomfort is likely nerve related. She was treated with Vicodin (takes at home as PRN pain meds) and Tylenol has not been effective for management.  Continue aspirin, ticagrelor, carvedilol, losartan and atorvastatin.  Hypertension: BP well-controlled.. Continue amlodipine, losartan, carvedilol.  Chronic systolic and diastolic heart failure: LVEF 45-50% on LV gram with hypokinesis of the basal inferior wall. EF was 55% 03/2015. Continue carvedilol, losartan and lasix.  DM: glucose well-controlled. She was treated with  insulin regimen during admission and resumed home regimen on discharge.   HL: Continue atorvastatin. She was on simvastatin at admission. LDL 97, goal less than 70. Will need LFT and Lipid panel recheck in 6 weeks.   Dizziness/vertigo: The patient complain of dizziness and room spinning each time she tried to ambulate. Prescribed meclizine PRN.  Resolved nauseas on Zofran. Evaluated by PT and recommended home health Pt and rolling walker with 5" wheels. Ambulated will without dizziness.   Hypokalemia: Supplement given. Resolved.  The patient has been seen by Dr. Oval Linsey  today and deemed ready for discharge home. All  follow-up appointments have been scheduled. Discharge medications are listed below.   F/u with PCP later this week and Dr. Nehemiah Massed at [place] [facility] next week. Given work note until 10/27/15. Given free scrip for 30 days supplies of Brilinta.    Discharge Vitals Blood pressure 102/65, pulse 72, temperature 98.2 F (36.8 C), temperature source Oral, resp. rate 16, height 5\' 7"  (1.702 m), weight 251 lb 4.8 oz (113.989 kg), SpO2 100 %.  Filed Weights   10/20/15 0458 10/21/15 0657 10/22/15 0600  Weight: 249 lb 6.4 oz (113.127 kg) 249 lb 11.2 oz (113.263 kg) 251 lb 4.8 oz (113.989 kg)    Labs & Radiologic Studies     CBC  Recent Labs  10/20/15 0400  WBC 8.0  HGB 10.3*  HCT 32.4*  MCV 78.3  PLT  99991111   Basic Metabolic Panel  Recent Labs  10/20/15 0400 10/22/15 0708  NA 138 137  K 3.3* 4.2  CL 102 101  CO2 26 24  GLUCOSE 106* 82  BUN 7 11  CREATININE 1.06* 0.96  CALCIUM 8.9 9.1   Fasting Lipid Panel  Recent Labs  10/20/15 0400  CHOL 153  HDL 31*  LDLCALC 97  TRIG 123  CHOLHDL 4.9   Thyroid Function Tests No results for input(s): TSH, T4TOTAL, T3FREE, THYROIDAB in the last 72 hours.  Invalid input(s): FREET3  Ct Abdomen Pelvis Wo Contrast  10/19/2015  CLINICAL DATA:  60 year old female with a history of groin pain and abdominal pain. Back pain. EXAM: CT ABDOMEN AND PELVIS WITHOUT CONTRAST TECHNIQUE: Multidetector CT imaging of the abdomen and pelvis was performed following the standard protocol without IV contrast. COMPARISON:  CT a of the chest, abdomen and pelvis 10/18/2015. FINDINGS: Lower chest: Atherosclerotic calcifications in the left main, left anterior descending, left circumflex and right coronary arteries. Status post median sternotomy. Hepatobiliary: No definite cystic or solid hepatic lesions are identified on today's noncontrast CT examination. Status post cholecystectomy. Pancreas: No definite pancreatic mass or peripancreatic inflammatory changes on  today's noncontrast CT examination. Spleen: Unremarkable. Adrenals/Urinary Tract: There are no abnormal calcifications within the collecting system of either kidney, along the course of either ureter, or within the lumen of the urinary bladder. No hydroureteronephrosis or perinephric stranding to suggest urinary tract obstruction at this time. The unenhanced appearance of the kidneys is unremarkable bilaterally. Unenhanced appearance of the urinary bladder is normal. Stomach/Bowel: Unenhanced appearance of the stomach is normal. There is no pathologic dilatation of small bowel or colon. Normal appendix. Vascular/Lymphatic: Atherosclerotic calcifications in the abdominal and pelvic vasculature, without evidence of aneurysm. No lymphadenopathy noted in the abdomen or pelvis. Reproductive: Status post hysterectomy. Ovaries are not confidently identified may be surgically absent or atrophic. Other: No significant volume of ascites.  No pneumoperitoneum. Musculoskeletal: There are no aggressive appearing lytic or blastic lesions noted in the visualized portions of the skeleton. IMPRESSION: 1. No acute findings in the abdomen or pelvis on today's noncontrast CT examination. No significant change from yesterday's CTA of the chest, abdomen and pelvis. 2. Normal appendix. 3. Status post cholecystectomy. 4. Atherosclerosis, including left main and 3 vessel coronary artery disease. Please note that although the presence of coronary artery calcium documents the presence of coronary artery disease, the severity of this disease and any potential stenosis cannot be assessed on this non-gated CT examination. Assessment for potential risk factor modification, dietary therapy or pharmacologic therapy may be warranted, if clinically indicated. 5. Additional incidental findings, as above. Electronically Signed   By: Vinnie Langton M.D.   On: 10/19/2015 15:42   Dg Chest Port 1 View  10/18/2015  CLINICAL DATA:  Chest pain since 6  a.m. today. EXAM: PORTABLE CHEST 1 VIEW COMPARISON:  06/12/2015. FINDINGS: Normal sized heart. Clear lungs with normal vascularity. Post CABG changes. Thoracic spine degenerative changes. IMPRESSION: No acute abnormality. Electronically Signed   By: Claudie Revering M.D.   On: 10/18/2015 08:12   Ct Angio Chest/abd/pel For Dissection W And/or W/wo  10/18/2015  CLINICAL DATA:  Chest pain since 6 a.m. today. Left arm numbness. Previous CABG. Previous cholecystectomy and hysterectomy. EXAM: CT ANGIOGRAPHY CHEST, ABDOMEN AND PELVIS TECHNIQUE: Multidetector CT imaging through the chest, abdomen and pelvis was performed using the standard protocol during bolus administration of intravenous contrast. Multiplanar reconstructed images and MIPs were obtained and reviewed  to evaluate the vascular anatomy. CONTRAST:  100 cc Isovue 370 COMPARISON:  Chest radiographs dated 06/12/2015. FINDINGS: CTA CHEST FINDINGS Post CABG changes. Normally opacified aorta and pulmonary arteries. No pulmonary arterial filling defects. No aortic dissection or aneurysm. Mild patchy opacity in both lower lobes, greater on the left. No lung nodules or enlarged lymph nodes. No pleural fluid. Thoracic spine degenerative changes. Review of the MIP images confirms the above findings. CTA ABDOMEN AND PELVIS FINDINGS Diffuse low density of the liver relative to the spleen. Cholecystectomy clips. Normal appearing pancreas.  1.0 cm upper splenic cyst. Minimal atheromatous arterial calcifications. No aortic aneurysm or dissection. No gastrointestinal abnormalities or enlarged lymph nodes. Normal appearing appendix. Normal appearing adrenal glands, kidneys, ureters and urinary bladder. No urinary tract calculi or hydronephrosis. Tiny umbilical hernia containing fat. Surgically absent uterus and ovaries. Lumbar spine degenerative changes. Review of the MIP images confirms the above findings. IMPRESSION: 1. No acute abnormality. Specifically, no aortic aneurysm or  dissection. 2. Mild patchy bilateral lower lobe atelectasis, greater on the left. This does not have the typical appearance of pneumonia. 3. Diffuse hepatic steatosis. Electronically Signed   By: Claudie Revering M.D.   On: 10/18/2015 09:35    Disposition   Pt is being discharged home today in good condition.  Follow-up Plans & Appointments    Follow-up Information    Follow up with Lavera Guise, MD In 3 days.   Specialty:  Internal Medicine   Why:  for post hospital   Contact information:   [place] [id number] [phone]       Follow up with Dr. Nehemiah Massed at [facility].   Why:  1-2 weeks for cardiology follow up     Discharge Instructions    AMB Referral to Cardiac Rehabilitation - Phase II    Complete by:  As directed   Diagnosis:  STEMI     Amb Referral to Cardiac Rehabilitation    Complete by:  As directed   Diagnosis:   STEMI Coronary Stents       Diet - low sodium heart healthy    Complete by:  As directed      Discharge instructions    Complete by:  As directed   No driving for 2 weeks. No lifting over 10 lbs for 4 weeks. No sexual activity for 4 weeks. You may not return to work until cleared by your cardiologist or PCP.  Keep procedure site clean & dry. If you notice increased pain, swelling, bleeding or pus, call/return!  You may shower, but no soaking baths/hot tubs/pools for 1 week.     Increase activity slowly    Complete by:  As directed            Discharge Medications   Current Discharge Medication List    START taking these medications   Details  atorvastatin (LIPITOR) 80 MG tablet Take 1 tablet (80 mg total) by mouth daily at 6 PM. Qty: 30 tablet, Refills: 6    carvedilol (COREG) 3.125 MG tablet Take 1 tablet (3.125 mg total) by mouth 2 (two) times daily with a meal. Qty: 60 tablet, Refills: 3    meclizine (ANTIVERT) 25 MG tablet Take 1 tablet (25 mg total) by mouth 3 (three) times daily as needed for dizziness. Qty: 30  tablet, Refills: 0    ticagrelor (BRILINTA) 90 MG TABS tablet Take 1 tablet (90 mg total) by mouth 2 (two) times daily. Qty: 60 tablet, Refills: 11  CONTINUE these medications which have CHANGED   Details  amLODipine (NORVASC) 10 MG tablet Take 1 tablet (10 mg total) by mouth daily. Qty: 30 tablet, Refills: 3      CONTINUE these medications which have NOT CHANGED   Details  aspirin EC 81 MG tablet Take 81 mg by mouth daily.    augmented betamethasone dipropionate (DIPROLENE-AF) 0.05 % cream Apply 1 application topically daily.    cyclobenzaprine (FLEXERIL) 10 MG tablet Take 10 mg by mouth at bedtime.     estradiol (ESTRACE) 0.5 MG tablet Take 0.5 mg by mouth daily.     furosemide (LASIX) 40 MG tablet Take 40 mg by mouth daily.     HYDROcodone-acetaminophen (NORCO/VICODIN) 5-325 MG tablet Take 1 tablet by mouth every 8 (eight) hours as needed. For pain.    losartan (COZAAR) 50 MG tablet Take 50 mg by mouth daily.     metFORMIN (GLUCOPHAGE) 500 MG tablet Take 250 mg by mouth 2 (two) times daily with a meal.     raloxifene (EVISTA) 60 MG tablet Take 60 mg by mouth daily.     ranitidine (ZANTAC) 150 MG tablet Take 150 mg by mouth daily.     sertraline (ZOLOFT) 100 MG tablet Take 100 mg by mouth daily.     VENTOLIN HFA 108 (90 BASE) MCG/ACT inhaler Inhale 2 puffs into the lungs every 6 (six) hours as needed for wheezing or shortness of breath.     VESICARE 10 MG tablet Take 10 mg by mouth daily.     zolpidem (AMBIEN) 10 MG tablet Take 10 mg by mouth at bedtime.    montelukast (SINGULAIR) 10 MG tablet Take 10 mg by mouth daily.     nitroGLYCERIN (NITROSTAT) 0.4 MG SL tablet Place 0.4 mg under the tongue every 5 (five) minutes x 3 doses as needed for chest pain. *If no relief call MD or go to Emergency Room*      STOP taking these medications     bisoprolol-hydrochlorothiazide (ZIAC) 2.5-6.25 MG per tablet      BRISDELLE 7.5 MG CAPS      simvastatin (ZOCOR) 40 MG  tablet      sulfamethoxazole-trimethoprim (BACTRIM DS,SEPTRA DS) 800-160 MG tablet          Aspirin prescribed at discharge?  Yes High Intensity Statin Prescribed? (Lipitor 40-80mg  or Crestor 20-40mg ): Yes Beta Blocker Prescribed? Yes For EF 45% or less, Was ACEI/ARB Prescribed? Yes ADP Receptor Inhibitor Prescribed? (i.e. Plavix etc.-Includes Medically Managed Patients): Yes For EF <40%, Aldosterone Inhibitor Prescribed? Not indicated. No signs of HF.  Was EF assessed during THIS hospitalization? Yes Was Cardiac Rehab II ordered? (Included Medically managed Patients): Yes   Outstanding Labs/Studies   LFT in 6 weeks.   Duration of Discharge Encounter   Greater than 30 minutes including physician time.  Signed, Diezel Mazur PA-C 10/22/2015, 1:27 PM

## 2015-10-22 NOTE — Progress Notes (Signed)
Physical Therapy Treatment Patient Details Name: Felicia Woods MRN: [id number] DOB: [birth year] Today's Date: 10/22/2015    History of Present Illness Patient is a 60 y/o female admitted with STEMI s/p stent on 10/18/14.  Previous history of CABG in 2010 due to CAD, also with HTN and DM.  Patient developed symptoms of dizziness since her procedure on 4/15 and reports ringing in R ear and up to 7/10 dizziness despite being started on Meclizine.    PT Comments    Patient much improved in regards to dizziness as well as R LE weakness.  Able to negotiate stairs necessary for home entry with min A and demonstrated vestibular adaptation exercise with min cues.  Continues to need follow up HHPT initially, but recommend outpatient cardiac rehab when finished with HHPT.  Follow Up Recommendations  Home health PT;Supervision - Intermittent (for vestibular rehab (then to outpatient cardiac rehab following HHPT))     Equipment Recommendations  Rolling walker with 5" wheels    Recommendations for Other Services       Precautions / Restrictions Precautions Precautions: Fall    Mobility  Bed Mobility Overal bed mobility: Modified Independent                Transfers   Equipment used: Rolling walker (2 wheeled) Transfers: Sit to/from Stand Sit to Stand: Supervision         General transfer comment: cues for hand placement  Ambulation/Gait Ambulation/Gait assistance: Supervision;Min guard Ambulation Distance (Feet): 120 Feet Assistive device: Rolling walker (2 wheeled) Gait Pattern/deviations: Step-through pattern;Decreased stride length     General Gait Details: no evidence of R LE buckling or weakness, overall dizziness with ambulation and stiars 5/10; seated rest needed prior to negotiating steps and requested to ride in chair back to room after stair negotiation   Stairs Stairs: Yes Stairs assistance: Min assist Stair Management: Sideways;Step to pattern;One rail  Right Number of Stairs: 10 General stair comments: cues for technique with L foot up first, right down first and assist for safety  Wheelchair Mobility    Modified Rankin (Stroke Patients Only)       Balance Overall balance assessment: Needs assistance   Sitting balance-Leahy Scale: Good       Standing balance-Leahy Scale: Fair Standing balance comment: generally stable in static standing                    Cognition Arousal/Alertness: Awake/alert Behavior During Therapy: Flat affect Overall Cognitive Status: Within Functional Limits for tasks assessed                      Exercises Other Exercises Other Exercises: performed VOR x 30 sec horiz and 30 sec vertical with min cues and demo for technique, seated in bed with back support, educated to perform seated EOB without support    General Comments        Pertinent Vitals/Pain Pain Assessment: No/denies pain    Home Living                      Prior Function            PT Goals (current goals can now be found in the care plan section) Progress towards PT goals: Progressing toward goals    Frequency       PT Plan Current plan remains appropriate    Co-evaluation             End of Session Equipment  Utilized During Treatment: Gait belt Activity Tolerance: Patient tolerated treatment well Patient left: in bed;with call bell/phone within reach     Time: 1014-1037 PT Time Calculation (min) (ACUTE ONLY): 23 min  Charges:  $Gait Training: 8-22 mins $Neuromuscular Re-education: 8-22 mins                    G Codes:      Reginia Naas 11-07-15, 1:50 PM  Magda Kiel, [place] 11/07/2015

## 2015-10-22 NOTE — Progress Notes (Signed)
Patient Name: Felicia Woods Date of Encounter: 10/22/2015   SUBJECTIVE  Improved vertigo and dizziness. Sore R groin but improved. No chest pain or sob.   CURRENT MEDS . amLODipine  10 mg Oral Daily  . aspirin  81 mg Oral Daily  . atorvastatin  80 mg Oral q1800  . carvedilol  3.125 mg Oral BID WC  . cyclobenzaprine  10 mg Oral QHS  . estradiol  0.5 mg Oral Daily  . furosemide  40 mg Oral Daily  . HYDROcodone-acetaminophen  1 tablet Oral Q6H  . insulin aspart  0-20 Units Subcutaneous TID WC  . losartan  50 mg Oral Daily  . montelukast  10 mg Oral Daily  . raloxifene  60 mg Oral Daily  . sertraline  100 mg Oral Daily  . sodium chloride flush  3 mL Intravenous Q12H  . sulfamethoxazole-trimethoprim  1 tablet Oral BID  . ticagrelor  90 mg Oral BID  . zolpidem  5 mg Oral QHS    OBJECTIVE  Filed Vitals:   10/21/15 1714 10/21/15 2025 10/22/15 0012 10/22/15 0600  BP: 111/67 123/71 134/62 102/65  Pulse: 73 77  72  Temp:  97.9 F (36.6 C)  98.2 F (36.8 C)  TempSrc:  Oral  Oral  Resp:  18 18 16   Height:      Weight:    251 lb 4.8 oz (113.989 kg)  SpO2:  100%      Intake/Output Summary (Last 24 hours) at 10/22/15 0854 Last data filed at 10/22/15 0841  Gross per 24 hour  Intake   1200 ml  Output   1000 ml  Net    200 ml   Filed Weights   10/20/15 0458 10/21/15 0657 10/22/15 0600  Weight: 249 lb 6.4 oz (113.127 kg) 249 lb 11.2 oz (113.263 kg) 251 lb 4.8 oz (113.989 kg)    PHYSICAL EXAM  General: Pleasant, NAD. Neuro: Alert and oriented X 3. Moves all extremities spontaneously. Psych: Normal affect. HEENT:  Normal  Neck: Supple without bruits or JVD. Lungs:  Resp regular and unlabored, CTA. Heart: RRR no s3, s4, or murmurs. Abdomen: Soft, non-tender, non-distended, BS + x 4.  Extremities: No clubbing, cyanosis or edema. DP/PT/Radials 2+ and equal bilaterally.  Accessory Clinical Findings  CBC  Recent Labs  10/20/15 0400  WBC 8.0  HGB 10.3*  HCT 32.4*    MCV 78.3  PLT 99991111   Basic Metabolic Panel  Recent Labs  10/20/15 0400  NA 138  K 3.3*  CL 102  CO2 26  GLUCOSE 106*  BUN 7  CREATININE 1.06*  CALCIUM 8.9   Fasting Lipid Panel  Recent Labs  10/20/15 0400  CHOL 153  HDL 31*  LDLCALC 97  TRIG 123  CHOLHDL 4.9   Thyroid Function Tests No results for input(s): TSH, T4TOTAL, T3FREE, THYROIDAB in the last 72 hours.  Invalid input(s): FREET3  TELE  Sinus rhythm  Radiology/Studies  Ct Abdomen Pelvis Wo Contrast  10/19/2015  CLINICAL DATA:  60 year old female with a history of groin pain and abdominal pain. Back pain. EXAM: CT ABDOMEN AND PELVIS WITHOUT CONTRAST TECHNIQUE: Multidetector CT imaging of the abdomen and pelvis was performed following the standard protocol without IV contrast. COMPARISON:  CT a of the chest, abdomen and pelvis 10/18/2015. FINDINGS: Lower chest: Atherosclerotic calcifications in the left main, left anterior descending, left circumflex and right coronary arteries. Status post median sternotomy. Hepatobiliary: No definite cystic or solid hepatic lesions are identified on today's  noncontrast CT examination. Status post cholecystectomy. Pancreas: No definite pancreatic mass or peripancreatic inflammatory changes on today's noncontrast CT examination. Spleen: Unremarkable. Adrenals/Urinary Tract: There are no abnormal calcifications within the collecting system of either kidney, along the course of either ureter, or within the lumen of the urinary bladder. No hydroureteronephrosis or perinephric stranding to suggest urinary tract obstruction at this time. The unenhanced appearance of the kidneys is unremarkable bilaterally. Unenhanced appearance of the urinary bladder is normal. Stomach/Bowel: Unenhanced appearance of the stomach is normal. There is no pathologic dilatation of small bowel or colon. Normal appendix. Vascular/Lymphatic: Atherosclerotic calcifications in the abdominal and pelvic vasculature,  without evidence of aneurysm. No lymphadenopathy noted in the abdomen or pelvis. Reproductive: Status post hysterectomy. Ovaries are not confidently identified may be surgically absent or atrophic. Other: No significant volume of ascites.  No pneumoperitoneum. Musculoskeletal: There are no aggressive appearing lytic or blastic lesions noted in the visualized portions of the skeleton. IMPRESSION: 1. No acute findings in the abdomen or pelvis on today's noncontrast CT examination. No significant change from yesterday's CTA of the chest, abdomen and pelvis. 2. Normal appendix. 3. Status post cholecystectomy. 4. Atherosclerosis, including left main and 3 vessel coronary artery disease. Please note that although the presence of coronary artery calcium documents the presence of coronary artery disease, the severity of this disease and any potential stenosis cannot be assessed on this non-gated CT examination. Assessment for potential risk factor modification, dietary therapy or pharmacologic therapy may be warranted, if clinically indicated. 5. Additional incidental findings, as above. Electronically Signed   By: Vinnie Langton M.D.   On: 10/19/2015 15:42   Dg Chest Port 1 View  10/18/2015  CLINICAL DATA:  Chest pain since 6 a.m. today. EXAM: PORTABLE CHEST 1 VIEW COMPARISON:  06/12/2015. FINDINGS: Normal sized heart. Clear lungs with normal vascularity. Post CABG changes. Thoracic spine degenerative changes. IMPRESSION: No acute abnormality. Electronically Signed   By: Claudie Revering M.D.   On: 10/18/2015 08:12   Ct Angio Chest/abd/pel For Dissection W And/or W/wo  10/18/2015  CLINICAL DATA:  Chest pain since 6 a.m. today. Left arm numbness. Previous CABG. Previous cholecystectomy and hysterectomy. EXAM: CT ANGIOGRAPHY CHEST, ABDOMEN AND PELVIS TECHNIQUE: Multidetector CT imaging through the chest, abdomen and pelvis was performed using the standard protocol during bolus administration of intravenous contrast.  Multiplanar reconstructed images and MIPs were obtained and reviewed to evaluate the vascular anatomy. CONTRAST:  100 cc Isovue 370 COMPARISON:  Chest radiographs dated 06/12/2015. FINDINGS: CTA CHEST FINDINGS Post CABG changes. Normally opacified aorta and pulmonary arteries. No pulmonary arterial filling defects. No aortic dissection or aneurysm. Mild patchy opacity in both lower lobes, greater on the left. No lung nodules or enlarged lymph nodes. No pleural fluid. Thoracic spine degenerative changes. Review of the MIP images confirms the above findings. CTA ABDOMEN AND PELVIS FINDINGS Diffuse low density of the liver relative to the spleen. Cholecystectomy clips. Normal appearing pancreas.  1.0 cm upper splenic cyst. Minimal atheromatous arterial calcifications. No aortic aneurysm or dissection. No gastrointestinal abnormalities or enlarged lymph nodes. Normal appearing appendix. Normal appearing adrenal glands, kidneys, ureters and urinary bladder. No urinary tract calculi or hydronephrosis. Tiny umbilical hernia containing fat. Surgically absent uterus and ovaries. Lumbar spine degenerative changes. Review of the MIP images confirms the above findings. IMPRESSION: 1. No acute abnormality. Specifically, no aortic aneurysm or dissection. 2. Mild patchy bilateral lower lobe atelectasis, greater on the left. This does not have the typical appearance of pneumonia. 3.  Diffuse hepatic steatosis. Electronically Signed   By: Claudie Revering M.D.   On: 10/18/2015 09:35    ASSESSMENT AND PLAN   9F with CAD status post CABG, hypertension, hyperlipidemia, and diabetes here with ST elevation myocardial infarction status post PCI of the SVG to RCA.   CAD s/p PCI or SVG to RCA: Ms. Litt underwent successful PCI of the vein graft to the RCA. She will need referral for cardiac rehabilitation. Continue aspirin, ticagrelor, carvedilol and atorvastatin. She reports pain in her groin and back after right groin  catheterization.  She had a CT scan that was negative for retroperitoneal bleed or other pathology. Per Dr. Oval Linsey her discomfort is likely nerve related. We will provide a short course of Vicodin at discharge, and Tylenol has not been effective for management.  Hypertension: BP well-controlled. However borderline soft. Continue amlodipine, losartan, carvedilol. Consider reducing dose of amlodipine.   Chronic systolic and diastolic heart failure: LVEF 45-50% on LV gram with hypokinesis of the basal inferior wall. EF was 55% 03/2015. Continue carvedilol, losartan and lasix.  DM: glucose well-controlled. Continue current insulin regimen and resume home regimen on discharge.   HL: Continue atorvastatin. She was on simvastatin at admission. LDL 97. Will need LFT and Lipid panel recheck in 6 weeks.   Dizziness/vertigo: The patient complain of dizziness and room spinning each time she tried to ambulate. Prescribed meclizine PRN. Will rx at discharge and f/u with PCP as outpatient. Improved. Resolved nauseas on Zofran. Evaluated by PT and recommended home health Pt and rolling walker with 5" wheels.   Hypokalemia: Supplement given. Pending BMET today.   Dispo: Her symptoms has been improved. Ambulate if does well discharge later today. F/u with PCP later this week and Dr. Nehemiah Massed at [facility] next week. Will need work note.   Jarrett Soho PA-C Pager [phone]

## 2015-10-25 DIAGNOSIS — Z7982 Long term (current) use of aspirin: Secondary | ICD-10-CM | POA: Diagnosis not present

## 2015-10-25 DIAGNOSIS — Z9181 History of falling: Secondary | ICD-10-CM | POA: Diagnosis not present

## 2015-10-25 DIAGNOSIS — I2111 ST elevation (STEMI) myocardial infarction involving right coronary artery: Secondary | ICD-10-CM | POA: Diagnosis not present

## 2015-10-25 DIAGNOSIS — Z7984 Long term (current) use of oral hypoglycemic drugs: Secondary | ICD-10-CM | POA: Diagnosis not present

## 2015-10-25 DIAGNOSIS — I5042 Chronic combined systolic (congestive) and diastolic (congestive) heart failure: Secondary | ICD-10-CM | POA: Diagnosis not present

## 2015-10-25 DIAGNOSIS — I110 Hypertensive heart disease with heart failure: Secondary | ICD-10-CM | POA: Diagnosis not present

## 2015-10-25 DIAGNOSIS — T82818D Embolism of vascular prosthetic devices, implants and grafts, subsequent encounter: Secondary | ICD-10-CM | POA: Diagnosis not present

## 2015-10-25 DIAGNOSIS — E785 Hyperlipidemia, unspecified: Secondary | ICD-10-CM | POA: Diagnosis not present

## 2015-10-25 DIAGNOSIS — Z7951 Long term (current) use of inhaled steroids: Secondary | ICD-10-CM | POA: Diagnosis not present

## 2015-10-25 DIAGNOSIS — I2510 Atherosclerotic heart disease of native coronary artery without angina pectoris: Secondary | ICD-10-CM | POA: Diagnosis not present

## 2015-10-25 DIAGNOSIS — Z951 Presence of aortocoronary bypass graft: Secondary | ICD-10-CM | POA: Diagnosis not present

## 2015-10-25 DIAGNOSIS — E119 Type 2 diabetes mellitus without complications: Secondary | ICD-10-CM | POA: Diagnosis not present

## 2015-10-25 DIAGNOSIS — Z7902 Long term (current) use of antithrombotics/antiplatelets: Secondary | ICD-10-CM | POA: Diagnosis not present

## 2015-10-29 DIAGNOSIS — I2111 ST elevation (STEMI) myocardial infarction involving right coronary artery: Secondary | ICD-10-CM | POA: Diagnosis not present

## 2015-10-29 DIAGNOSIS — E119 Type 2 diabetes mellitus without complications: Secondary | ICD-10-CM | POA: Diagnosis not present

## 2015-10-29 DIAGNOSIS — T82818D Embolism of vascular prosthetic devices, implants and grafts, subsequent encounter: Secondary | ICD-10-CM | POA: Diagnosis not present

## 2015-10-29 DIAGNOSIS — I5042 Chronic combined systolic (congestive) and diastolic (congestive) heart failure: Secondary | ICD-10-CM | POA: Diagnosis not present

## 2015-10-29 DIAGNOSIS — I110 Hypertensive heart disease with heart failure: Secondary | ICD-10-CM | POA: Diagnosis not present

## 2015-10-29 DIAGNOSIS — I2510 Atherosclerotic heart disease of native coronary artery without angina pectoris: Secondary | ICD-10-CM | POA: Diagnosis not present

## 2015-10-30 ENCOUNTER — Telehealth: Payer: Self-pay | Admitting: Cardiovascular Disease

## 2015-10-30 DIAGNOSIS — I2510 Atherosclerotic heart disease of native coronary artery without angina pectoris: Secondary | ICD-10-CM | POA: Diagnosis not present

## 2015-10-30 DIAGNOSIS — T82818D Embolism of vascular prosthetic devices, implants and grafts, subsequent encounter: Secondary | ICD-10-CM | POA: Diagnosis not present

## 2015-10-30 DIAGNOSIS — I2111 ST elevation (STEMI) myocardial infarction involving right coronary artery: Secondary | ICD-10-CM | POA: Diagnosis not present

## 2015-10-30 DIAGNOSIS — E119 Type 2 diabetes mellitus without complications: Secondary | ICD-10-CM | POA: Diagnosis not present

## 2015-10-30 DIAGNOSIS — I5042 Chronic combined systolic (congestive) and diastolic (congestive) heart failure: Secondary | ICD-10-CM | POA: Diagnosis not present

## 2015-10-30 DIAGNOSIS — I110 Hypertensive heart disease with heart failure: Secondary | ICD-10-CM | POA: Diagnosis not present

## 2015-10-30 MED ORDER — ATORVASTATIN CALCIUM 80 MG PO TABS: 80.0000 mg | ORAL_TABLET | Freq: Every day | ORAL | Status: DC

## 2015-10-30 MED ORDER — CARVEDILOL 3.125 MG PO TABS: 3.1250 mg | ORAL_TABLET | Freq: Two times a day (BID) | ORAL | Status: DC

## 2015-10-30 MED ORDER — AMLODIPINE BESYLATE 10 MG PO TABS: 10.0000 mg | ORAL_TABLET | Freq: Every day | ORAL | Status: DC

## 2015-10-30 NOTE — Telephone Encounter (Signed)
Brett(PT) called in stating that while on a visit with the pt she was complaining of dizziness and nausea. She also did not receive 3 medications once she was discharged from the hospital and she would like to have them filled.    *STAT* If patient is at the pharmacy, call can be transferred to refill team.   1. Which medications need to be refilled? (please list name of each medication and dose if known) Amlodipine,Atorvastatin,Carvedilol  2. Which pharmacy/location (including street and city if local pharmacy) is medication to be sent to? [facility] in [place]   3. Do they need a 30 day or 90 day supply? [place]

## 2015-10-30 NOTE — Telephone Encounter (Signed)
New patient - Eval 4/15 in [facility] ED and transported to [facility] Cath lab for emergent procedure. Dr. Gwenlyn Found performed cath w/ stent and graft. Returned call to [place] and inquired about symptoms. States patient was unable to participate fully in PT today, had some dizziness and nausea. Pt was prescribed meclizine 12.5mg  TID PRN while in hospital. Unclear if she has on-hand/tried this for her symptoms.  Pt's VS were taken, WNR as reported by physical therapist. She denied CP, SOB, and fatigue. She needed renewal on her cardiac meds - i sent these to [facility].  I reached out to patient to discuss symptoms - no answer when dialed at home or on cell, no way to leave msg on cell phone.  I have reached out to scheduling Dalene Seltzer) to see if we can get this patient in for a visit w/ PA -- no post hosp visit currently scheduled.  Routed to Dr. Gwenlyn Found for any additional advice.

## 2015-10-31 NOTE — Telephone Encounter (Signed)
msg sent to scheduling. 

## 2015-10-31 NOTE — Telephone Encounter (Signed)
Put on [facility] clinic sched next week

## 2015-11-03 DIAGNOSIS — E782 Mixed hyperlipidemia: Secondary | ICD-10-CM | POA: Diagnosis not present

## 2015-11-03 DIAGNOSIS — R0602 Shortness of breath: Secondary | ICD-10-CM | POA: Diagnosis not present

## 2015-11-03 DIAGNOSIS — R079 Chest pain, unspecified: Secondary | ICD-10-CM | POA: Diagnosis not present

## 2015-11-03 DIAGNOSIS — R0789 Other chest pain: Secondary | ICD-10-CM | POA: Diagnosis not present

## 2015-11-03 DIAGNOSIS — I10 Essential (primary) hypertension: Secondary | ICD-10-CM | POA: Diagnosis not present

## 2015-11-03 DIAGNOSIS — I2584 Coronary atherosclerosis due to calcified coronary lesion: Secondary | ICD-10-CM | POA: Diagnosis not present

## 2015-11-03 DIAGNOSIS — I2119 ST elevation (STEMI) myocardial infarction involving other coronary artery of inferior wall: Secondary | ICD-10-CM | POA: Diagnosis not present

## 2015-11-03 DIAGNOSIS — J4540 Moderate persistent asthma, uncomplicated: Secondary | ICD-10-CM | POA: Diagnosis not present

## 2015-11-03 DIAGNOSIS — I25708 Atherosclerosis of coronary artery bypass graft(s), unspecified, with other forms of angina pectoris: Secondary | ICD-10-CM | POA: Diagnosis not present

## 2015-11-07 DIAGNOSIS — N941 Unspecified dyspareunia: Secondary | ICD-10-CM | POA: Diagnosis not present

## 2015-11-08 LAB — HM PAP SMEAR: HM PAP: NEGATIVE

## 2015-11-10 DIAGNOSIS — I2111 ST elevation (STEMI) myocardial infarction involving right coronary artery: Secondary | ICD-10-CM | POA: Diagnosis not present

## 2015-11-10 DIAGNOSIS — I10 Essential (primary) hypertension: Secondary | ICD-10-CM | POA: Diagnosis not present

## 2015-11-10 DIAGNOSIS — I2510 Atherosclerotic heart disease of native coronary artery without angina pectoris: Secondary | ICD-10-CM | POA: Diagnosis not present

## 2015-11-10 DIAGNOSIS — T82818D Embolism of vascular prosthetic devices, implants and grafts, subsequent encounter: Secondary | ICD-10-CM | POA: Diagnosis not present

## 2015-11-12 ENCOUNTER — Ambulatory Visit (INDEPENDENT_AMBULATORY_CARE_PROVIDER_SITE_OTHER): Payer: Medicare Other | Admitting: Cardiology

## 2015-11-12 ENCOUNTER — Encounter: Payer: Self-pay | Admitting: Cardiology

## 2015-11-12 VITALS — BP 118/80 | HR 108 | Ht 67.0 in | Wt 256.0 lb

## 2015-11-12 DIAGNOSIS — I4510 Unspecified right bundle-branch block: Secondary | ICD-10-CM | POA: Diagnosis not present

## 2015-11-12 DIAGNOSIS — R0602 Shortness of breath: Secondary | ICD-10-CM | POA: Insufficient documentation

## 2015-11-12 DIAGNOSIS — I2119 ST elevation (STEMI) myocardial infarction involving other coronary artery of inferior wall: Secondary | ICD-10-CM

## 2015-11-12 DIAGNOSIS — Z951 Presence of aortocoronary bypass graft: Secondary | ICD-10-CM | POA: Diagnosis not present

## 2015-11-12 DIAGNOSIS — R0609 Other forms of dyspnea: Secondary | ICD-10-CM | POA: Diagnosis not present

## 2015-11-12 DIAGNOSIS — R0600 Dyspnea, unspecified: Secondary | ICD-10-CM

## 2015-11-12 DIAGNOSIS — E1159 Type 2 diabetes mellitus with other circulatory complications: Secondary | ICD-10-CM | POA: Insufficient documentation

## 2015-11-12 DIAGNOSIS — E119 Type 2 diabetes mellitus without complications: Secondary | ICD-10-CM | POA: Insufficient documentation

## 2015-11-12 DIAGNOSIS — Z955 Presence of coronary angioplasty implant and graft: Secondary | ICD-10-CM

## 2015-11-12 NOTE — Assessment & Plan Note (Signed)
Presumably secondary to [facility]

## 2015-11-12 NOTE — Assessment & Plan Note (Signed)
BMI 40 

## 2015-11-12 NOTE — Patient Instructions (Signed)
Follow up with Cardiologist in [place] caffeine when you take Brilinta

## 2015-11-12 NOTE — Progress Notes (Signed)
11/12/2015 Felicia Woods   January 18, 1956  [id number]  Primary Physician Lavera Guise, MD Primary Cardiologist: Dr Serafina [facility])  HPI:  60 year old AA married female with a past medical history of HTN, DM, and CAD- s/p 4 vessel CABG in 2010. She is followed by Dr Nehemiah Massed in [place]. She presented 10/18/15 to [facility] with an inferior STEMI. She was transferred to [facility] for treatment. The pt had cath and urgent intervention with successful aspiration thrombectomy, PCI, and drug-eluting stenting of an occluded RCA SVG. LVF was 45-50%. Other grafts were patent- SVG-RI, SVG-Dx2, LIMA-LAD.  Post MI she had groin pain, CT was unremarkable for bleed. She is in the office today for post hospital follow up. Since discharge she has noted DOE, especially using stairs. She denies chest pain or hemoptysis.    Current Outpatient Prescriptions  Medication Sig Dispense Refill  . amLODipine (NORVASC) 10 MG tablet Take 1 tablet (10 mg total) by mouth daily. 30 tablet 3  . aspirin EC 81 MG tablet Take 81 mg by mouth daily.    Marland Kitchen atorvastatin (LIPITOR) 80 MG tablet Take 1 tablet (80 mg total) by mouth daily at 6 PM. 30 tablet 3  . augmented betamethasone dipropionate (DIPROLENE-AF) 0.05 % cream Apply 1 application topically daily.    . carvedilol (COREG) 3.125 MG tablet Take 1 tablet (3.125 mg total) by mouth 2 (two) times daily with a meal. 60 tablet 3  . cyclobenzaprine (FLEXERIL) 10 MG tablet Take 10 mg by mouth at bedtime.     Marland Kitchen estradiol (ESTRACE) 0.5 MG tablet Take 0.5 mg by mouth daily.     . furosemide (LASIX) 40 MG tablet Take 40 mg by mouth daily.     Marland Kitchen HYDROcodone-acetaminophen (NORCO/VICODIN) 5-325 MG tablet Take 1 tablet by mouth every 8 (eight) hours as needed. For pain.    Marland Kitchen losartan (COZAAR) 50 MG tablet Take 50 mg by mouth daily.     . meclizine (ANTIVERT) 25 MG tablet Take 1 tablet (25 mg total) by mouth 3 (three) times daily as needed for dizziness. 30 tablet 0  . metFORMIN  (GLUCOPHAGE) 500 MG tablet Take 250 mg by mouth 2 (two) times daily with a meal.     . montelukast (SINGULAIR) 10 MG tablet Take 10 mg by mouth daily.     . nitroGLYCERIN (NITROSTAT) 0.4 MG SL tablet Place 0.4 mg under the tongue every 5 (five) minutes x 3 doses as needed for chest pain. *If no relief call MD or go to Emergency Room*    . raloxifene (EVISTA) 60 MG tablet Take 60 mg by mouth daily.     . ranitidine (ZANTAC) 150 MG tablet Take 150 mg by mouth daily.     . sertraline (ZOLOFT) 100 MG tablet Take 100 mg by mouth daily.     . ticagrelor (BRILINTA) 90 MG TABS tablet Take 1 tablet (90 mg total) by mouth 2 (two) times daily. 60 tablet 11  . VENTOLIN HFA 108 (90 BASE) MCG/ACT inhaler Inhale 2 puffs into the lungs every 6 (six) hours as needed for wheezing or shortness of breath.     . VESICARE 10 MG tablet Take 10 mg by mouth daily.     Marland Kitchen zolpidem (AMBIEN) 10 MG tablet Take 10 mg by mouth at bedtime.     No current facility-administered medications for this visit.    Allergies  Allergen Reactions  . Penicillin G Anaphylaxis and Other (See Comments)    Has patient  had a PCN reaction causing immediate rash, facial/tongue/throat swelling, SOB or lightheadedness with hypotension: [id number] Has patient had a PCN reaction causing severe rash involving mucus membranes or skin necrosis: no:[id number]} Has patient had a PCN reaction that required hospitalization no:[id number]} Has patient had a PCN reaction occurring within the last 10 years: no:[id number]} If all of the above answers are "NO", then may proceed with Cephalosporin use.    Social History   Social History  . Marital Status: Single    Spouse Name: N/A  . Number of Children: N/A  . Years of Education: N/A   Occupational History  . Not on file.   Social History Main Topics  . Smoking status: Never Smoker   . Smokeless tobacco: Not on file  . Alcohol Use: No  . Drug Use: Not on file  . Sexual Activity: Yes   Other  Topics Concern  . Not on file   Social History Narrative     Review of Systems: General: negative for chills, fever, night sweats or weight changes.  Cardiovascular: negative for chest pain, dyspnea on exertion, edema, orthopnea, palpitations, paroxysmal nocturnal dyspnea or shortness of breath Dermatological: negative for rash Respiratory: negative for cough or wheezing Urologic: negative for hematuria Abdominal: negative for nausea, vomiting, diarrhea, bright red blood per rectum, melena, or hematemesis Neurologic: negative for visual changes, syncope, or dizziness All other systems reviewed and are otherwise negative except as noted above.    Blood pressure 118/80, pulse 108, height 5\' 7"  (1.702 m), weight 256 lb (116.121 kg).  General appearance: alert, cooperative, no distress and morbidly obese Neck: no carotid bruit and no JVD Lungs: clear to auscultation bilaterally Heart: regular rate and rhythm Extremities: no edema Neurologic: Grossly normal  EKG NSR, 108, RBBB  ASSESSMENT AND PLAN:   STEMI 10/18/15 .  SVG-RCA throbectomy and DES 10/18/15 .  Hx of CABG 2010 .  Morbid obesity ([place]) BMI 40  DOE (dyspnea on exertion) Presumably secondary to Brilinta    PLAN  Caffeine with Brilinta. F/U Dr Nehemiah Massed as scheduled in June, we can see prn. Letter describing difficulty with stairs provided to pt.   Kerin Ransom K PA-C 11/12/2015 8:35 AM

## 2015-11-18 ENCOUNTER — Encounter: Payer: Medicare Other | Attending: Cardiovascular Disease | Admitting: *Deleted

## 2015-11-18 VITALS — Ht 66.25 in | Wt 253.0 lb

## 2015-11-18 DIAGNOSIS — I213 ST elevation (STEMI) myocardial infarction of unspecified site: Secondary | ICD-10-CM

## 2015-11-18 DIAGNOSIS — I10 Essential (primary) hypertension: Secondary | ICD-10-CM | POA: Diagnosis not present

## 2015-11-18 DIAGNOSIS — I2510 Atherosclerotic heart disease of native coronary artery without angina pectoris: Secondary | ICD-10-CM | POA: Diagnosis not present

## 2015-11-18 DIAGNOSIS — M545 Low back pain: Secondary | ICD-10-CM | POA: Diagnosis not present

## 2015-11-18 DIAGNOSIS — Z9861 Coronary angioplasty status: Secondary | ICD-10-CM

## 2015-11-18 DIAGNOSIS — I214 Non-ST elevation (NSTEMI) myocardial infarction: Secondary | ICD-10-CM | POA: Insufficient documentation

## 2015-11-18 DIAGNOSIS — E119 Type 2 diabetes mellitus without complications: Secondary | ICD-10-CM | POA: Diagnosis not present

## 2015-11-18 DIAGNOSIS — R05 Cough: Secondary | ICD-10-CM | POA: Diagnosis not present

## 2015-11-18 DIAGNOSIS — J019 Acute sinusitis, unspecified: Secondary | ICD-10-CM | POA: Diagnosis not present

## 2015-11-18 NOTE — Patient Instructions (Signed)
Patient Instructions  Patient Details  Name: Felicia Woods MRN: [id number] Date of Birth: 1956-06-09 Referring Provider:  Lorretta Harp, MD  Below are the personal goals you chose as well as exercise and nutrition goals. Our goal is to help you keep on track towards obtaining and maintaining your goals. We will be discussing your progress on these goals with you throughout the program.  Initial Exercise Prescription:     Initial Exercise Prescription - 11/18/15 1400    Date of Initial Exercise RX and Referring Provider   Date 11/18/15   Treadmill   MPH 1.5   Grade 0   Minutes 15  Exercise in short bouts to achieve 15 min total   NuStep   Level 2   Watts 40   Minutes 15   Recumbant Elliptical   Level 1   Watts 10   Minutes 15   REL-XR   Level 2   Watts 50   Minutes 15   T5 Nustep   Level 2   Watts 40   Minutes 15   Biostep-RELP   Level 1   Watts 15   Minutes 15   Prescription Details   Frequency (times per week) 3   Duration Progress to 30 minutes of continuous aerobic without signs/symptoms of physical distress   Intensity   THRR REST +  30   Ratings of Perceived Exertion 11-15   Perceived Dyspnea 0-4   Progression   Progression Continue to progress workloads to maintain intensity without signs/symptoms of physical distress.   Resistance Training   Training Prescription Yes   Weight 2   Reps 10-15      Exercise Goals: Frequency: Be able to perform aerobic exercise three times per week working toward 3-5 days per week.  Intensity: Work with a perceived exertion of 11 (fairly light) - 15 (hard) as tolerated. Follow your new exercise prescription and watch for changes in prescription as you progress with the program. Changes will be reviewed with you when they are made.  Duration: You should be able to do 30 minutes of continuous aerobic exercise in addition to a 5 minute warm-up and a 5 minute cool-down routine.  Nutrition Goals: Your personal  nutrition goals will be established when you do your nutrition analysis with the dietician.  The following are nutrition guidelines to follow: Cholesterol < 200mg /day Sodium < 1500mg /day Fiber: Women over 50 yrs - 21 grams per day  Personal Goals:     Personal Goals and Risk Factors at Admission - 11/18/15 1220    Core Components/Risk Factors/Patient Goals on Admission    Weight Management Yes;Obesity   Intervention Weight Management: Develop a combined nutrition and exercise program designed to reach desired caloric intake, while maintaining appropriate intake of nutrient and fiber, sodium and fats, and appropriate energy expenditure required for the weight goal.;Weight Management: Provide education and appropriate resources to help participant work on and attain dietary goals.;Weight Management/Obesity: Establish reasonable short term and long term weight goals.;Obesity: Provide education and appropriate resources to help participant work on and attain dietary goals.   Admit Weight 253 lb (114.76 kg)   Goal Weight: Short Term 245 lb (111.131 kg)   Goal Weight: Long Term 180 lb (81.647 kg)   Expected Outcomes Short Term: Continue to assess and modify interventions until short term weight is achieved;Long Term: Adherence to nutrition and physical activity/exercise program aimed toward attainment of established weight goal   Sedentary Yes   Intervention Provide advice, education, support and  counseling about physical activity/exercise needs.;Develop an individualized exercise prescription for aerobic and resistive training based on initial evaluation findings, risk stratification, comorbidities and participant's personal goals.   Expected Outcomes Achievement of increased cardiorespiratory fitness and enhanced flexibility, muscular endurance and strength shown through measurements of functional capacity and personal statement of participant.   Diabetes Yes   Intervention Provide education about  signs/symptoms and action to take for hypo/hyperglycemia.;Provide education about proper nutrition, including hydration, and aerobic/resistive exercise prescription along with prescribed medications to achieve blood glucose in normal ranges: Fasting glucose 65-99 mg/dL   Expected Outcomes Short Term: Participant verbalizes understanding of the signs/symptoms and immediate care of hyper/hypoglycemia, proper foot care and importance of medication, aerobic/resistive exercise and nutrition plan for blood glucose control.;Long Term: Attainment of HbA1C < 7%.   Hypertension Yes   Intervention Provide education on lifestyle modifcations including regular physical activity/exercise, weight management, moderate sodium restriction and increased consumption of fresh fruit, vegetables, and low fat dairy, alcohol moderation, and smoking cessation.;Monitor prescription use compliance.   Expected Outcomes Short Term: Continued assessment and intervention until BP is < 140/36mm HG in hypertensive participants. < 130/65mm HG in hypertensive participants with diabetes, heart failure or chronic kidney disease.;Long Term: Maintenance of blood pressure at goal levels.   Lipids Yes   Intervention Provide education and support for participant on nutrition & aerobic/resistive exercise along with prescribed medications to achieve LDL 70mg , HDL >40mg .   Expected Outcomes Short Term: Participant states understanding of desired cholesterol values and is compliant with medications prescribed. Participant is following exercise prescription and nutrition guidelines.;Long Term: Cholesterol controlled with medications as prescribed, with individualized exercise RX and with personalized nutrition plan. Value goals: LDL < 70mg , HDL > 40 mg.   Stress Yes  Lives on second floor with steps only to get into apartment.  Symptoms occur everytime when walking up steps.  HAs had MD write note for her to move to first floor apartment and has taken  letter to landlord.    Intervention Offer individual and/or small group education and counseling on adjustment to heart disease, stress management and health-related lifestyle change. Teach and support self-help strategies.   Expected Outcomes Short Term: Participant demonstrates changes in health-related behavior, relaxation and other stress management skills, ability to obtain effective social support, and compliance with psychotropic medications if prescribed.;Long Term: Emotional wellbeing is indicated by absence of clinically significant psychosocial distress or social isolation.      Tobacco Use Initial Evaluation: History  Smoking status  . Never Smoker   Smokeless tobacco  . Not on file    Copy of goals given to participant.

## 2015-11-18 NOTE — Progress Notes (Signed)
Cardiac Individual Treatment Plan  Patient Details  Name: Felicia Woods MRN: [id number] Date of Birth: 1955-11-05 Referring Provider:    Initial Encounter Date:       Cardiac Rehab from 11/18/2015 in [facility] Cardiac and Pulmonary Rehab   Date  11/18/15      Visit Diagnosis: ST elevation myocardial infarction (STEMI), unspecified artery ([place])  S/P PTCA (percutaneous transluminal coronary angioplasty)  Patient's Home Medications on Admission:  Current outpatient prescriptions:  .  amLODipine (NORVASC) 10 MG tablet, Take 1 tablet (10 mg total) by mouth daily., Disp: 30 tablet, Rfl: 3 .  aspirin EC 81 MG tablet, Take 81 mg by mouth daily., Disp: , Rfl:  .  atorvastatin (LIPITOR) 80 MG tablet, Take 1 tablet (80 mg total) by mouth daily at 6 PM., Disp: 30 tablet, Rfl: 3 .  carvedilol (COREG) 3.125 MG tablet, Take 1 tablet (3.125 mg total) by mouth 2 (two) times daily with a meal., Disp: 60 tablet, Rfl: 3 .  cyclobenzaprine (FLEXERIL) 10 MG tablet, Take 10 mg by mouth at bedtime. , Disp: , Rfl:  .  estradiol (ESTRACE) 0.5 MG tablet, Take 0.5 mg by mouth daily. , Disp: , Rfl:  .  furosemide (LASIX) 40 MG tablet, Take 40 mg by mouth daily. , Disp: , Rfl:  .  HYDROcodone-acetaminophen (NORCO/VICODIN) 5-325 MG tablet, Take 1 tablet by mouth every 8 (eight) hours as needed. For pain., Disp: , Rfl:  .  losartan (COZAAR) 50 MG tablet, Take 50 mg by mouth daily. , Disp: , Rfl:  .  metFORMIN (GLUCOPHAGE) 500 MG tablet, Take 250 mg by mouth 2 (two) times daily with a meal. , Disp: , Rfl:  .  montelukast (SINGULAIR) 10 MG tablet, Take 10 mg by mouth daily. , Disp: , Rfl:  .  raloxifene (EVISTA) 60 MG tablet, Take 60 mg by mouth daily. , Disp: , Rfl:  .  ranitidine (ZANTAC) 150 MG tablet, Take 150 mg by mouth daily. , Disp: , Rfl:  .  sertraline (ZOLOFT) 100 MG tablet, Take 100 mg by mouth daily. , Disp: , Rfl:  .  ticagrelor (BRILINTA) 90 MG TABS tablet, Take 1 tablet (90 mg total) by mouth 2 (two)  times daily., Disp: 60 tablet, Rfl: 11 .  VENTOLIN HFA 108 (90 BASE) MCG/ACT inhaler, Inhale 2 puffs into the lungs every 6 (six) hours as needed for wheezing or shortness of breath. , Disp: , Rfl:  .  VESICARE 10 MG tablet, Take 10 mg by mouth daily. , Disp: , Rfl:  .  zolpidem (AMBIEN) 10 MG tablet, Take 10 mg by mouth at bedtime., Disp: , Rfl:  .  augmented betamethasone dipropionate (DIPROLENE-AF) 0.05 % cream, Apply 1 application topically daily. Reported on 11/18/2015, Disp: , Rfl:  .  meclizine (ANTIVERT) 25 MG tablet, Take 1 tablet (25 mg total) by mouth 3 (three) times daily as needed for dizziness. (Patient not taking: Reported on 11/18/2015), Disp: 30 tablet, Rfl: 0 .  nitroGLYCERIN (NITROSTAT) 0.4 MG SL tablet, Place 0.4 mg under the tongue every 5 (five) minutes x 3 doses as needed for chest pain. *If no relief call MD or go to Emergency Room*, Disp: , Rfl:   Past Medical History: Past Medical History  Diagnosis Date  . Hypertension   . Diabetes mellitus without complication ([place])   . MI (myocardial infarction) ([place])   . Coronary artery disease     Tobacco Use: History  Smoking status  . Never Smoker   Smokeless  tobacco  . Not on file    Labs: Recent Review Flowsheet Data    Labs for ITP Cardiac and Pulmonary Rehab Latest Ref Rng 10/18/2015 10/20/2015   Cholestrol 0 - 200 mg/dL - 153   LDLCALC 0 - 99 mg/dL - 97   HDL >40 mg/dL - 31(L)   Trlycerides <150 mg/dL - 123   TCO2 0 - 100 mmol/L 21 -       Exercise Target Goals: Date: 11/18/15  Exercise Program Goal: Individual exercise prescription set with THRR, safety & activity barriers. Participant demonstrates ability to understand and report RPE using BORG scale, to self-measure pulse accurately, and to acknowledge the importance of the exercise prescription.  Exercise Prescription Goal: Starting with aerobic activity 30 plus minutes a day, 3 days per week for initial exercise prescription. Provide home exercise  prescription and guidelines that participant acknowledges understanding prior to discharge.  Activity Barriers & Risk Stratification:     Activity Barriers & Cardiac Risk Stratification - 11/18/15 1213    Activity Barriers & Cardiac Risk Stratification   Activity Barriers Shortness of Breath;Chest Pain/Angina  Squeezing in the chest and SOB with exertion, resting resovles symptoms.  Can occur 2-3 times a day.  MD is aware.   Cardiac Risk Stratification High      6 Minute Walk:     6 Minute Walk      11/18/15 1458 11/18/15 1503     6 Minute Walk   Phase Initial     Distance 770 feet     Walk Time 4 minutes     # of Rest Breaks 1     RPE 13     Symptoms  Yes (comment)    Comments  Pt stopped at 4 minutes of walk, stating "chest tightness" which resolved with rest.     Resting HR 94 bpm     Resting BP 126/70 mmHg     Max Ex. HR 122 bpm     Max Ex. BP 134/72 mmHg        Initial Exercise Prescription:     Initial Exercise Prescription - 11/18/15 1400    Date of Initial Exercise RX and Referring Provider   Date 11/18/15   Treadmill   MPH 1.5   Grade 0   Minutes 15  Exercise in short bouts to achieve 15 min total   NuStep   Level 2   Watts 40   Minutes 15   Recumbant Elliptical   Level 1   Watts 10   Minutes 15   REL-XR   Level 2   Watts 50   Minutes 15   T5 Nustep   Level 2   Watts 40   Minutes 15   Biostep-RELP   Level 1   Watts 15   Minutes 15   Prescription Details   Frequency (times per week) 3   Duration Progress to 30 minutes of continuous aerobic without signs/symptoms of physical distress   Intensity   THRR REST +  30   Ratings of Perceived Exertion 11-15   Perceived Dyspnea 0-4   Progression   Progression Continue to progress workloads to maintain intensity without signs/symptoms of physical distress.   Resistance Training   Training Prescription Yes   Weight 2   Reps 10-15      Perform Capillary Blood Glucose checks as  needed.  Exercise Prescription Changes:     Exercise Prescription Changes      11/18/15 1300  Response to Exercise   Blood Pressure (Admit) 126/60 mmHg       Blood Pressure (Exercise) 134/72 mmHg       Blood Pressure (Exit) 132/82 mmHg       Heart Rate (Admit) 94 bpm       Heart Rate (Exercise) 122 bpm       Heart Rate (Exit) 85 bpm          Exercise Comments:     Exercise Comments      11/18/15 1502           Exercise Comments Patient limited in 6 minute walk by "chest tightness" which resolved with rest          Discharge Exercise Prescription (Final Exercise Prescription Changes):     Exercise Prescription Changes - 11/18/15 1300    Response to Exercise   Blood Pressure (Admit) 126/60 mmHg   Blood Pressure (Exercise) 134/72 mmHg   Blood Pressure (Exit) 132/82 mmHg   Heart Rate (Admit) 94 bpm   Heart Rate (Exercise) 122 bpm   Heart Rate (Exit) 85 bpm      Nutrition:  Target Goals: Understanding of nutrition guidelines, daily intake of sodium 1500mg , cholesterol 200mg , calories 30% from fat and 7% or less from saturated fats, daily to have 5 or more servings of fruits and vegetables.  Biometrics:     Pre Biometrics - 11/18/15 1457    Pre Biometrics   Height 5' 6.25" (1.683 m)   Weight 253 lb (114.76 kg)   Waist Circumference 49 inches   Hip Circumference 55 inches   Waist to Hip Ratio 0.89 %   BMI (Calculated) 40.6       Nutrition Therapy Plan and Nutrition Goals:     Nutrition Therapy & Goals - 11/18/15 1237    Intervention Plan   Intervention Prescribe, educate and counsel regarding individualized specific dietary modifications aiming towards targeted core components such as weight, hypertension, lipid management, diabetes, heart failure and other comorbidities.   Expected Outcomes Short Term Goal: Understand basic principles of dietary content, such as calories, fat, sodium, cholesterol and nutrients.;Short Term Goal: A plan has  been developed with personal nutrition goals set during dietitian appointment.;Long Term Goal: Adherence to prescribed nutrition plan.      Nutrition Discharge: Rate Your Plate Scores:   Nutrition Goals Re-Evaluation:   Psychosocial: Target Goals: Acknowledge presence or absence of depression, maximize coping skills, provide positive support system. Participant is able to verbalize types and ability to use techniques and skills needed for reducing stress and depression.  Initial Review & Psychosocial Screening:     Initial Psych Review & Screening - 11/18/15 1230    Initial Review   Current issues with Current Sleep Concerns  Takes sleeping pills and they do not help.MD is aware as of today.Marland Kitchen Apt stairs and symptoms, MD has written letter to help [place] move to first level apartment   [place]? Yes  Husband and 2 sons and a daughter   Barriers   Psychosocial barriers to participate in program There are no identifiable barriers or psychosocial needs.   Screening Interventions   Interventions Encouraged to exercise      Quality of Life Scores:     Quality of Life - 11/18/15 1651    Quality of Life Scores   Health/Function Pre 17.3 %   Socioeconomic Pre 15.58 %   Psych/Spiritual Pre 25.71 %   Family Pre 25.2 %   GLOBAL  Pre 19.97 %      PHQ-9:     Recent Review Flowsheet Data    Depression screen [facility] 2/9 11/18/2015 02/10/2015   Decreased Interest 1 0   Down, Depressed, Hopeless 2 0    PHQ - 2 Score 3 0   Altered sleeping 3 -   Tired, decreased energy 3 -   Change in appetite 2 -   Feeling bad or failure about yourself  0 -   Trouble concentrating 0 -   Moving slowly or fidgety/restless 0 -   Suicidal thoughts 0 -   PHQ-9 Score 11 -   Difficult doing work/chores Somewhat difficult -      Psychosocial Evaluation and Intervention:   Psychosocial Re-Evaluation:   Vocational Rehabilitation: Provide vocational rehab assistance to  qualifying candidates.   Vocational Rehab Evaluation & Intervention:     Vocational Rehab - 11/18/15 1220    Initial Vocational Rehab Evaluation & Intervention   Assessment shows need for Vocational Rehabilitation No      Education: Education Goals: Education classes will be provided on a weekly basis, covering required topics. Participant will state understanding/return demonstration of topics presented.  Learning Barriers/Preferences:     Learning Barriers/Preferences - 11/18/15 1219    Learning Barriers/Preferences   Learning Barriers None   Learning Preferences Individual Instruction;Verbal Instruction      Education Topics: General Nutrition Guidelines/Fats and Fiber: -Group instruction provided by verbal, written material, models and posters to present the general guidelines for heart healthy nutrition. Gives an explanation and review of dietary fats and fiber.   Controlling Sodium/Reading Food Labels: -Group verbal and written material supporting the discussion of sodium use in heart healthy nutrition. Review and explanation with models, verbal and written materials for utilization of the food label.   Exercise Physiology & Risk Factors: - Group verbal and written instruction with models to review the exercise physiology of the cardiovascular system and associated critical values. Details cardiovascular disease risk factors and the goals associated with each risk factor.   Aerobic Exercise & Resistance Training: - Gives group verbal and written discussion on the health impact of inactivity. On the components of aerobic and resistive training programs and the benefits of this training and how to safely progress through these programs.   Flexibility, Balance, General Exercise Guidelines: - Provides group verbal and written instruction on the benefits of flexibility and balance training programs. Provides general exercise guidelines with specific guidelines to those with  heart or lung disease. Demonstration and skill practice provided.   Stress Management: - Provides group verbal and written instruction about the health risks of elevated stress, cause of high stress, and healthy ways to reduce stress.   Depression: - Provides group verbal and written instruction on the correlation between heart/lung disease and depressed mood, treatment options, and the stigmas associated with seeking treatment.   Anatomy & Physiology of the Heart: - Group verbal and written instruction and models provide basic cardiac anatomy and physiology, with the coronary electrical and arterial systems. Review of: AMI, Angina, Valve disease, Heart Failure, Cardiac Arrhythmia, Pacemakers, and the ICD.   Cardiac Procedures: - Group verbal and written instruction and models to describe the testing methods done to diagnose heart disease. Reviews the outcomes of the test results. Describes the treatment choices: Medical Management, Angioplasty, or Coronary Bypass Surgery.   Cardiac Medications: - Group verbal and written instruction to review commonly prescribed medications for heart disease. Reviews the medication, class of the drug, and side effects.  Includes the steps to properly store meds and maintain the prescription regimen.   Go Sex-Intimacy & Heart Disease, Get SMART - Goal Setting: - Group verbal and written instruction through game format to discuss heart disease and the return to sexual intimacy. Provides group verbal and written material to discuss and apply goal setting through the application of the S.M.A.R.T. Method.   Other Matters of the Heart: - Provides group verbal, written materials and models to describe Heart Failure, Angina, Valve Disease, and Diabetes in the realm of heart disease. Includes description of the disease process and treatment options available to the cardiac patient.   Exercise & Equipment Safety: - Individual verbal instruction and  demonstration of equipment use and safety with use of the equipment.          Cardiac Rehab from 11/18/2015 in [facility] Cardiac and Pulmonary Rehab   Date  11/18/15   Educator  SB   Instruction Review Code  2- meets goals/outcomes      Infection Prevention: - Provides verbal and written material to individual with discussion of infection control including proper hand washing and proper equipment cleaning during exercise session.      Cardiac Rehab from 11/18/2015 in [facility] Cardiac and Pulmonary Rehab   Date  11/18/15   Educator  SB   Instruction Review Code  2- meets goals/outcomes      Falls Prevention: - Provides verbal and written material to individual with discussion of falls prevention and safety.      Cardiac Rehab from 11/18/2015 in [facility] Cardiac and Pulmonary Rehab   Date  11/18/15   Educator  SB   Instruction Review Code  2- meets goals/outcomes      Diabetes: - Individual verbal and written instruction to review signs/symptoms of diabetes, desired ranges of glucose level fasting, after meals and with exercise. Advice that pre and post exercise glucose checks will be done for 3 sessions at entry of program.      Cardiac Rehab from 11/18/2015 in [facility] Cardiac and Pulmonary Rehab   Date  11/18/15   Educator  SB   Instruction Review Code  2- meets goals/outcomes       Knowledge Questionnaire Score:     Knowledge Questionnaire Score - 11/18/15 1651    Knowledge Questionnaire Score   Pre Score 14/28      Core Components/Risk Factors/Patient Goals at Admission:     Personal Goals and Risk Factors at Admission - 11/18/15 1220    Core Components/Risk Factors/Patient Goals on Admission    Weight Management Yes;Obesity   Intervention Weight Management: Develop a combined nutrition and exercise program designed to reach desired caloric intake, while maintaining appropriate intake of nutrient and fiber, sodium and fats, and appropriate energy expenditure required for the weight  goal.;Weight Management: Provide education and appropriate resources to help participant work on and attain dietary goals.;Weight Management/Obesity: Establish reasonable short term and long term weight goals.;Obesity: Provide education and appropriate resources to help participant work on and attain dietary goals.   Admit Weight 253 lb (114.76 kg)   Goal Weight: Short Term 245 lb (111.131 kg)   Goal Weight: Long Term 180 lb (81.647 kg)   Expected Outcomes Short Term: Continue to assess and modify interventions until short term weight is achieved;Long Term: Adherence to nutrition and physical activity/exercise program aimed toward attainment of established weight goal   Sedentary Yes   Intervention Provide advice, education, support and counseling about physical activity/exercise needs.;Develop an individualized exercise prescription for aerobic  and resistive training based on initial evaluation findings, risk stratification, comorbidities and participant's personal goals.   Expected Outcomes Achievement of increased cardiorespiratory fitness and enhanced flexibility, muscular endurance and strength shown through measurements of functional capacity and personal statement of participant.   Diabetes Yes   Intervention Provide education about signs/symptoms and action to take for hypo/hyperglycemia.;Provide education about proper nutrition, including hydration, and aerobic/resistive exercise prescription along with prescribed medications to achieve blood glucose in normal ranges: Fasting glucose 65-99 mg/dL   Expected Outcomes Short Term: Participant verbalizes understanding of the signs/symptoms and immediate care of hyper/hypoglycemia, proper foot care and importance of medication, aerobic/resistive exercise and nutrition plan for blood glucose control.;Long Term: Attainment of HbA1C < 7%.   Hypertension Yes   Intervention Provide education on lifestyle modifcations including regular physical  activity/exercise, weight management, moderate sodium restriction and increased consumption of fresh fruit, vegetables, and low fat dairy, alcohol moderation, and smoking cessation.;Monitor prescription use compliance.   Expected Outcomes Short Term: Continued assessment and intervention until BP is < 140/90mm HG in hypertensive participants. < 130/18mm HG in hypertensive participants with diabetes, heart failure or chronic kidney disease.;Long Term: Maintenance of blood pressure at goal levels.   Lipids Yes   Intervention Provide education and support for participant on nutrition & aerobic/resistive exercise along with prescribed medications to achieve LDL 70mg , HDL >40mg .   Expected Outcomes Short Term: Participant states understanding of desired cholesterol values and is compliant with medications prescribed. Participant is following exercise prescription and nutrition guidelines.;Long Term: Cholesterol controlled with medications as prescribed, with individualized exercise RX and with personalized nutrition plan. Value goals: LDL < 70mg , HDL > 40 mg.   Stress Yes  Lives on second floor with steps only to get into apartment.  Symptoms occur everytime when walking up steps.  HAs had MD write note for her to move to first floor apartment and has taken letter to landlord.    Intervention Offer individual and/or small group education and counseling on adjustment to heart disease, stress management and health-related lifestyle change. Teach and support self-help strategies.   Expected Outcomes Short Term: Participant demonstrates changes in health-related behavior, relaxation and other stress management skills, ability to obtain effective social support, and compliance with psychotropic medications if prescribed.;Long Term: Emotional wellbeing is indicated by absence of clinically significant psychosocial distress or social isolation.      Core Components/Risk Factors/Patient Goals Review:    Core  Components/Risk Factors/Patient Goals at Discharge (Final Review):    ITP Comments:     ITP Comments      11/18/15 1648           ITP Comments INitial medical evaluation completed   [id number] with ITP To start sesions Monday May 22           Comments:

## 2015-11-23 ENCOUNTER — Encounter: Payer: Self-pay | Admitting: *Deleted

## 2015-11-23 DIAGNOSIS — I213 ST elevation (STEMI) myocardial infarction of unspecified site: Secondary | ICD-10-CM

## 2015-11-23 DIAGNOSIS — Z9861 Coronary angioplasty status: Secondary | ICD-10-CM

## 2015-11-23 NOTE — Progress Notes (Signed)
Cardiac Individual Treatment Plan  Patient Details  Name: Felicia Woods MRN: [id number] Date of Birth: [birth year] Referring Provider:    Initial Encounter Date:       Cardiac Rehab from 11/18/2015 in [facility] Cardiac and Pulmonary Rehab   Date  11/18/15      Visit Diagnosis: ST elevation myocardial infarction (STEMI), unspecified artery ([place])  S/P PTCA (percutaneous transluminal coronary angioplasty)  Patient's Home Medications on Admission:  Current outpatient prescriptions:  .  amLODipine (NORVASC) 10 MG tablet, Take 1 tablet (10 mg total) by mouth daily., Disp: 30 tablet, Rfl: 3 .  aspirin EC 81 MG tablet, Take 81 mg by mouth daily., Disp: , Rfl:  .  atorvastatin (LIPITOR) 80 MG tablet, Take 1 tablet (80 mg total) by mouth daily at 6 PM., Disp: 30 tablet, Rfl: 3 .  augmented betamethasone dipropionate (DIPROLENE-AF) 0.05 % cream, Apply 1 application topically daily. Reported on 11/18/2015, Disp: , Rfl:  .  carvedilol (COREG) 3.125 MG tablet, Take 1 tablet (3.125 mg total) by mouth 2 (two) times daily with a meal., Disp: 60 tablet, Rfl: 3 .  cyclobenzaprine (FLEXERIL) 10 MG tablet, Take 10 mg by mouth at bedtime. , Disp: , Rfl:  .  estradiol (ESTRACE) 0.5 MG tablet, Take 0.5 mg by mouth daily. , Disp: , Rfl:  .  furosemide (LASIX) 40 MG tablet, Take 40 mg by mouth daily. , Disp: , Rfl:  .  HYDROcodone-acetaminophen (NORCO/VICODIN) 5-325 MG tablet, Take 1 tablet by mouth every 8 (eight) hours as needed. For pain., Disp: , Rfl:  .  losartan (COZAAR) 50 MG tablet, Take 50 mg by mouth daily. , Disp: , Rfl:  .  meclizine (ANTIVERT) 25 MG tablet, Take 1 tablet (25 mg total) by mouth 3 (three) times daily as needed for dizziness. (Patient not taking: Reported on 11/18/2015), Disp: 30 tablet, Rfl: 0 .  metFORMIN (GLUCOPHAGE) 500 MG tablet, Take 250 mg by mouth 2 (two) times daily with a meal. , Disp: , Rfl:  .  montelukast (SINGULAIR) 10 MG tablet, Take 10 mg by mouth daily. , Disp: , Rfl:  .   nitroGLYCERIN (NITROSTAT) 0.4 MG SL tablet, Place 0.4 mg under the tongue every 5 (five) minutes x 3 doses as needed for chest pain. *If no relief call MD or go to Emergency Room*, Disp: , Rfl:  .  raloxifene (EVISTA) 60 MG tablet, Take 60 mg by mouth daily. , Disp: , Rfl:  .  ranitidine (ZANTAC) 150 MG tablet, Take 150 mg by mouth daily. , Disp: , Rfl:  .  sertraline (ZOLOFT) 100 MG tablet, Take 100 mg by mouth daily. , Disp: , Rfl:  .  ticagrelor (BRILINTA) 90 MG TABS tablet, Take 1 tablet (90 mg total) by mouth 2 (two) times daily., Disp: 60 tablet, Rfl: 11 .  VENTOLIN HFA 108 (90 BASE) MCG/ACT inhaler, Inhale 2 puffs into the lungs every 6 (six) hours as needed for wheezing or shortness of breath. , Disp: , Rfl:  .  VESICARE 10 MG tablet, Take 10 mg by mouth daily. , Disp: , Rfl:  .  zolpidem (AMBIEN) 10 MG tablet, Take 10 mg by mouth at bedtime., Disp: , Rfl:   Past Medical History: Past Medical History  Diagnosis Date  . Hypertension   . Diabetes mellitus without complication ([place])   . MI (myocardial infarction) ([place])   . Coronary artery disease     Tobacco Use: History  Smoking status  . Never Smoker   Smokeless  tobacco  . Not on file    Labs: Recent Review Flowsheet Data    Labs for ITP Cardiac and Pulmonary Rehab Latest Ref Rng 10/18/2015 10/20/2015   Cholestrol 0 - 200 mg/dL - 153   LDLCALC 0 - 99 mg/dL - 97   HDL >40 mg/dL - 31(L)   Trlycerides <150 mg/dL - 123   TCO2 0 - 100 mmol/L 21 -       Exercise Target Goals:    Exercise Program Goal: Individual exercise prescription set with THRR, safety & activity barriers. Participant demonstrates ability to understand and report RPE using BORG scale, to self-measure pulse accurately, and to acknowledge the importance of the exercise prescription.  Exercise Prescription Goal: Starting with aerobic activity 30 plus minutes a day, 3 days per week for initial exercise prescription. Provide home exercise prescription and  guidelines that participant acknowledges understanding prior to discharge.  Activity Barriers & Risk Stratification:     Activity Barriers & Cardiac Risk Stratification - 11/18/15 1213    Activity Barriers & Cardiac Risk Stratification   Activity Barriers Shortness of Breath;Chest Pain/Angina  Squeezing in the chest and SOB with exertion, resting resovles symptoms.  Can occur 2-3 times a day.  MD is aware.   Cardiac Risk Stratification High      6 Minute Walk:     6 Minute Walk      11/18/15 1458 11/18/15 1503     6 Minute Walk   Phase Initial     Distance 770 feet     Walk Time 4 minutes     # of Rest Breaks 1     RPE 13     Symptoms  Yes (comment)    Comments  Pt stopped at 4 minutes of walk, stating "chest tightness" which resolved with rest.     Resting HR 94 bpm     Resting BP 126/70 mmHg     Max Ex. HR 122 bpm     Max Ex. BP 134/72 mmHg        Initial Exercise Prescription:     Initial Exercise Prescription - 11/18/15 1400    Date of Initial Exercise RX and Referring Provider   Date 11/18/15   Treadmill   MPH 1.5   Grade 0   Minutes 15  Exercise in short bouts to achieve 15 min total   NuStep   Level 2   Watts 40   Minutes 15   Recumbant Elliptical   Level 1   Watts 10   Minutes 15   REL-XR   Level 2   Watts 50   Minutes 15   T5 Nustep   Level 2   Watts 40   Minutes 15   Biostep-RELP   Level 1   Watts 15   Minutes 15   Prescription Details   Frequency (times per week) 3   Duration Progress to 30 minutes of continuous aerobic without signs/symptoms of physical distress   Intensity   THRR REST +  30   Ratings of Perceived Exertion 11-15   Perceived Dyspnea 0-4   Progression   Progression Continue to progress workloads to maintain intensity without signs/symptoms of physical distress.   Resistance Training   Training Prescription Yes   Weight 2   Reps 10-15      Perform Capillary Blood Glucose checks as needed.  Exercise  Prescription Changes:     Exercise Prescription Changes      11/18/15 1300  Response to Exercise   Blood Pressure (Admit) 126/60 mmHg       Blood Pressure (Exercise) 134/72 mmHg       Blood Pressure (Exit) 132/82 mmHg       Heart Rate (Admit) 94 bpm       Heart Rate (Exercise) 122 bpm       Heart Rate (Exit) 85 bpm          Exercise Comments:     Exercise Comments      11/18/15 1502           Exercise Comments Patient limited in 6 minute walk by "chest tightness" which resolved with rest          Discharge Exercise Prescription (Final Exercise Prescription Changes):     Exercise Prescription Changes - 11/18/15 1300    Response to Exercise   Blood Pressure (Admit) 126/60 mmHg   Blood Pressure (Exercise) 134/72 mmHg   Blood Pressure (Exit) 132/82 mmHg   Heart Rate (Admit) 94 bpm   Heart Rate (Exercise) 122 bpm   Heart Rate (Exit) 85 bpm      Nutrition:  Target Goals: Understanding of nutrition guidelines, daily intake of sodium 1500mg , cholesterol 200mg , calories 30% from fat and 7% or less from saturated fats, daily to have 5 or more servings of fruits and vegetables.  Biometrics:     Pre Biometrics - 11/18/15 1457    Pre Biometrics   Height 5' 6.25" (1.683 m)   Weight 253 lb (114.76 kg)   Waist Circumference 49 inches   Hip Circumference 55 inches   Waist to Hip Ratio 0.89 %   BMI (Calculated) 40.6       Nutrition Therapy Plan and Nutrition Goals:     Nutrition Therapy & Goals - 11/18/15 1237    Intervention Plan   Intervention Prescribe, educate and counsel regarding individualized specific dietary modifications aiming towards targeted core components such as weight, hypertension, lipid management, diabetes, heart failure and other comorbidities.   Expected Outcomes Short Term Goal: Understand basic principles of dietary content, such as calories, fat, sodium, cholesterol and nutrients.;Short Term Goal: A plan has been developed with  personal nutrition goals set during dietitian appointment.;Long Term Goal: Adherence to prescribed nutrition plan.      Nutrition Discharge: Rate Your Plate Scores:   Nutrition Goals Re-Evaluation:   Psychosocial: Target Goals: Acknowledge presence or absence of depression, maximize coping skills, provide positive support system. Participant is able to verbalize types and ability to use techniques and skills needed for reducing stress and depression.  Initial Review & Psychosocial Screening:     Initial Psych Review & Screening - 11/18/15 1230    Initial Review   Current issues with Current Sleep Concerns  Takes sleeping pills and they do not help.MD is aware as of today.Marland Kitchen Apt stairs and symptoms, MD has written letter to help Kat[place] move to first level apartment   [place]? Yes  Husband and 2 sons and a daughter   Barriers   Psychosocial barriers to participate in program There are no identifiable barriers or psychosocial needs.   Screening Interventions   Interventions Encouraged to exercise      Quality of Life Scores:     Quality of Life - 11/18/15 1651    Quality of Life Scores   Health/Function Pre 17.3 %   Socioeconomic Pre 15.58 %   Psych/Spiritual Pre 25.71 %   Family Pre 25.2 %   GLOBAL  Pre 19.97 %      PHQ-9:     Recent Review Flowsheet Data    Depression screen [facility] 2/9 11/18/2015 02/10/2015   Decreased Interest 1 0   Down, Depressed, Hopeless 2 0    PHQ - 2 Score 3 0   Altered sleeping 3 -   Tired, decreased energy 3 -   Change in appetite 2 -   Feeling bad or failure about yourself  0 -   Trouble concentrating 0 -   Moving slowly or fidgety/restless 0 -   Suicidal thoughts 0 -   PHQ-9 Score 11 -   Difficult doing work/chores Somewhat difficult -      Psychosocial Evaluation and Intervention:   Psychosocial Re-Evaluation:   Vocational Rehabilitation: Provide vocational rehab assistance to qualifying  candidates.   Vocational Rehab Evaluation & Intervention:     Vocational Rehab - 11/18/15 1220    Initial Vocational Rehab Evaluation & Intervention   Assessment shows need for Vocational Rehabilitation No      Education: Education Goals: Education classes will be provided on a weekly basis, covering required topics. Participant will state understanding/return demonstration of topics presented.  Learning Barriers/Preferences:     Learning Barriers/Preferences - 11/18/15 1219    Learning Barriers/Preferences   Learning Barriers None   Learning Preferences Individual Instruction;Verbal Instruction      Education Topics: General Nutrition Guidelines/Fats and Fiber: -Group instruction provided by verbal, written material, models and posters to present the general guidelines for heart healthy nutrition. Gives an explanation and review of dietary fats and fiber.   Controlling Sodium/Reading Food Labels: -Group verbal and written material supporting the discussion of sodium use in heart healthy nutrition. Review and explanation with models, verbal and written materials for utilization of the food label.   Exercise Physiology & Risk Factors: - Group verbal and written instruction with models to review the exercise physiology of the cardiovascular system and associated critical values. Details cardiovascular disease risk factors and the goals associated with each risk factor.   Aerobic Exercise & Resistance Training: - Gives group verbal and written discussion on the health impact of inactivity. On the components of aerobic and resistive training programs and the benefits of this training and how to safely progress through these programs.   Flexibility, Balance, General Exercise Guidelines: - Provides group verbal and written instruction on the benefits of flexibility and balance training programs. Provides general exercise guidelines with specific guidelines to those with heart or  lung disease. Demonstration and skill practice provided.   Stress Management: - Provides group verbal and written instruction about the health risks of elevated stress, cause of high stress, and healthy ways to reduce stress.   Depression: - Provides group verbal and written instruction on the correlation between heart/lung disease and depressed mood, treatment options, and the stigmas associated with seeking treatment.   Anatomy & Physiology of the Heart: - Group verbal and written instruction and models provide basic cardiac anatomy and physiology, with the coronary electrical and arterial systems. Review of: AMI, Angina, Valve disease, Heart Failure, Cardiac Arrhythmia, Pacemakers, and the ICD.   Cardiac Procedures: - Group verbal and written instruction and models to describe the testing methods done to diagnose heart disease. Reviews the outcomes of the test results. Describes the treatment choices: Medical Management, Angioplasty, or Coronary Bypass Surgery.   Cardiac Medications: - Group verbal and written instruction to review commonly prescribed medications for heart disease. Reviews the medication, class of the drug, and side effects.  Includes the steps to properly store meds and maintain the prescription regimen.   Go Sex-Intimacy & Heart Disease, Get SMART - Goal Setting: - Group verbal and written instruction through game format to discuss heart disease and the return to sexual intimacy. Provides group verbal and written material to discuss and apply goal setting through the application of the S.M.A.R.T. Method.   Other Matters of the Heart: - Provides group verbal, written materials and models to describe Heart Failure, Angina, Valve Disease, and Diabetes in the realm of heart disease. Includes description of the disease process and treatment options available to the cardiac patient.   Exercise & Equipment Safety: - Individual verbal instruction and demonstration of  equipment use and safety with use of the equipment.          Cardiac Rehab from 11/18/2015 in [facility] Cardiac and Pulmonary Rehab   Date  11/18/15   Educator  SB   Instruction Review Code  2- meets goals/outcomes      Infection Prevention: - Provides verbal and written material to individual with discussion of infection control including proper hand washing and proper equipment cleaning during exercise session.      Cardiac Rehab from 11/18/2015 in [facility] Cardiac and Pulmonary Rehab   Date  11/18/15   Educator  SB   Instruction Review Code  2- meets goals/outcomes      Falls Prevention: - Provides verbal and written material to individual with discussion of falls prevention and safety.      Cardiac Rehab from 11/18/2015 in [facility] Cardiac and Pulmonary Rehab   Date  11/18/15   Educator  SB   Instruction Review Code  2- meets goals/outcomes      Diabetes: - Individual verbal and written instruction to review signs/symptoms of diabetes, desired ranges of glucose level fasting, after meals and with exercise. Advice that pre and post exercise glucose checks will be done for 3 sessions at entry of program.      Cardiac Rehab from 11/18/2015 in [facility] Cardiac and Pulmonary Rehab   Date  11/18/15   Educator  SB   Instruction Review Code  2- meets goals/outcomes       Knowledge Questionnaire Score:     Knowledge Questionnaire Score - 11/18/15 1651    Knowledge Questionnaire Score   Pre Score 14/28      Core Components/Risk Factors/Patient Goals at Admission:     Personal Goals and Risk Factors at Admission - 11/18/15 1220    Core Components/Risk Factors/Patient Goals on Admission    Weight Management Yes;Obesity   Intervention Weight Management: Develop a combined nutrition and exercise program designed to reach desired caloric intake, while maintaining appropriate intake of nutrient and fiber, sodium and fats, and appropriate energy expenditure required for the weight goal.;Weight  Management: Provide education and appropriate resources to help participant work on and attain dietary goals.;Weight Management/Obesity: Establish reasonable short term and long term weight goals.;Obesity: Provide education and appropriate resources to help participant work on and attain dietary goals.   Admit Weight 253 lb (114.76 kg)   Goal Weight: Short Term 245 lb (111.131 kg)   Goal Weight: Long Term 180 lb (81.647 kg)   Expected Outcomes Short Term: Continue to assess and modify interventions until short term weight is achieved;Long Term: Adherence to nutrition and physical activity/exercise program aimed toward attainment of established weight goal   Sedentary Yes   Intervention Provide advice, education, support and counseling about physical activity/exercise needs.;Develop an individualized exercise prescription for aerobic  and resistive training based on initial evaluation findings, risk stratification, comorbidities and participant's personal goals.   Expected Outcomes Achievement of increased cardiorespiratory fitness and enhanced flexibility, muscular endurance and strength shown through measurements of functional capacity and personal statement of participant.   Diabetes Yes   Intervention Provide education about signs/symptoms and action to take for hypo/hyperglycemia.;Provide education about proper nutrition, including hydration, and aerobic/resistive exercise prescription along with prescribed medications to achieve blood glucose in normal ranges: Fasting glucose 65-99 mg/dL   Expected Outcomes Short Term: Participant verbalizes understanding of the signs/symptoms and immediate care of hyper/hypoglycemia, proper foot care and importance of medication, aerobic/resistive exercise and nutrition plan for blood glucose control.;Long Term: Attainment of HbA1C < 7%.   Hypertension Yes   Intervention Provide education on lifestyle modifcations including regular physical activity/exercise, weight  management, moderate sodium restriction and increased consumption of fresh fruit, vegetables, and low fat dairy, alcohol moderation, and smoking cessation.;Monitor prescription use compliance.   Expected Outcomes Short Term: Continued assessment and intervention until BP is < 140/15mm HG in hypertensive participants. < 130/15mm HG in hypertensive participants with diabetes, heart failure or chronic kidney disease.;Long Term: Maintenance of blood pressure at goal levels.   Lipids Yes   Intervention Provide education and support for participant on nutrition & aerobic/resistive exercise along with prescribed medications to achieve LDL 70mg , HDL >40mg .   Expected Outcomes Short Term: Participant states understanding of desired cholesterol values and is compliant with medications prescribed. Participant is following exercise prescription and nutrition guidelines.;Long Term: Cholesterol controlled with medications as prescribed, with individualized exercise RX and with personalized nutrition plan. Value goals: LDL < 70mg , HDL > 40 mg.   Stress Yes  Lives on second floor with steps only to get into apartment.  Symptoms occur everytime when walking up steps.  HAs had MD write note for her to move to first floor apartment and has taken letter to landlord.    Intervention Offer individual and/or small group education and counseling on adjustment to heart disease, stress management and health-related lifestyle change. Teach and support self-help strategies.   Expected Outcomes Short Term: Participant demonstrates changes in health-related behavior, relaxation and other stress management skills, ability to obtain effective social support, and compliance with psychotropic medications if prescribed.;Long Term: Emotional wellbeing is indicated by absence of clinically significant psychosocial distress or social isolation.      Core Components/Risk Factors/Patient Goals Review:    Core Components/Risk  Factors/Patient Goals at Discharge (Final Review):    ITP Comments:     ITP Comments      11/18/15 1648 11/23/15 1308         ITP Comments INitial medical evaluation completed   [id number] with ITP To start sesions Monday May 22  30 day review. Continue with ITP. Orientation completed to start sessions on May 22         Comments:

## 2015-11-26 ENCOUNTER — Encounter: Payer: Medicare Other | Admitting: *Deleted

## 2015-11-26 DIAGNOSIS — I213 ST elevation (STEMI) myocardial infarction of unspecified site: Secondary | ICD-10-CM

## 2015-11-26 DIAGNOSIS — Z9861 Coronary angioplasty status: Secondary | ICD-10-CM | POA: Diagnosis not present

## 2015-11-26 DIAGNOSIS — I214 Non-ST elevation (NSTEMI) myocardial infarction: Secondary | ICD-10-CM | POA: Diagnosis not present

## 2015-11-26 LAB — GLUCOSE, CAPILLARY: Glucose-Capillary: 140 mg/dL — ABNORMAL HIGH (ref 65–99)

## 2015-11-26 NOTE — Progress Notes (Signed)
Daily Session Note  Patient Details  Name: Felicia Woods MRN: [id number] Date of Birth: 1955/08/17 Referring Provider:            Cardiac Rehab from 11/26/2015 in [facility] Cardiac and Pulmonary Rehab   Referring Provider  Quay Burow MD      Encounter Date: 11/26/2015  Check In:     Session Check In - 11/26/15 0946    Check-In   Location [facility]-Cardiac & Pulmonary Rehab   Staff Present Nyoka Cowden, RN;Beyla Loney Luan Pulling, MA, ACSM RCEP, Exercise Physiologist;Amanda Oletta Darter, BA, ACSM CEP, Exercise Physiologist;Susanne Bice, RN, BSN, CCRP;Diane Joya Gaskins, RN, BSN   Supervising physician immediately available to respond to emergencies See telemetry face sheet for immediately available ER MD   Medication changes reported     No   Fall or balance concerns reported    No   Warm-up and Cool-down Performed on first and last piece of equipment   Resistance Training Performed Yes   VAD Patient? No   Pain Assessment   Currently in Pain? No/denies   Multiple Pain Sites No         Goals Met:  Exercise tolerated well Personal goals reviewed No report of cardiac concerns or symptoms Strength training completed today Oriented to equipment for first day  Goals Unmet:  Not Applicable  Comments: Pt completed first full exercise session.  Able to tolerate prescription without complaint.  Pt was able to increase some workloads which were updated.  Will continue to monitor for progression.   Dr. Emily Filbert is Medical Director for [place] and [facility].

## 2015-11-27 DIAGNOSIS — E119 Type 2 diabetes mellitus without complications: Secondary | ICD-10-CM | POA: Diagnosis not present

## 2015-11-27 DIAGNOSIS — B370 Candidal stomatitis: Secondary | ICD-10-CM | POA: Diagnosis not present

## 2015-11-27 DIAGNOSIS — I10 Essential (primary) hypertension: Secondary | ICD-10-CM | POA: Diagnosis not present

## 2015-11-27 DIAGNOSIS — K029 Dental caries, unspecified: Secondary | ICD-10-CM | POA: Diagnosis not present

## 2015-12-02 DIAGNOSIS — I10 Essential (primary) hypertension: Secondary | ICD-10-CM | POA: Diagnosis not present

## 2015-12-02 DIAGNOSIS — I25708 Atherosclerosis of coronary artery bypass graft(s), unspecified, with other forms of angina pectoris: Secondary | ICD-10-CM | POA: Diagnosis not present

## 2015-12-02 DIAGNOSIS — R0602 Shortness of breath: Secondary | ICD-10-CM | POA: Diagnosis not present

## 2015-12-02 DIAGNOSIS — I2119 ST elevation (STEMI) myocardial infarction involving other coronary artery of inferior wall: Secondary | ICD-10-CM | POA: Diagnosis not present

## 2015-12-03 ENCOUNTER — Encounter: Payer: Medicare Other | Admitting: *Deleted

## 2015-12-03 DIAGNOSIS — Z9861 Coronary angioplasty status: Secondary | ICD-10-CM

## 2015-12-03 DIAGNOSIS — I214 Non-ST elevation (NSTEMI) myocardial infarction: Secondary | ICD-10-CM | POA: Diagnosis not present

## 2015-12-03 DIAGNOSIS — I213 ST elevation (STEMI) myocardial infarction of unspecified site: Secondary | ICD-10-CM

## 2015-12-03 LAB — GLUCOSE, CAPILLARY: Glucose-Capillary: 106 mg/dL — ABNORMAL HIGH (ref 65–99)

## 2015-12-03 NOTE — Progress Notes (Signed)
Daily Session Note  Patient Details  Name: Felicia Woods MRN: [id number] Date of Birth: July 01, 1956 Referring Provider:        Cardiac Rehab from 11/26/2015 in [facility] Cardiac and Pulmonary Rehab   Referring Provider  Quay Burow MD      Encounter Date: 12/03/2015  Check In:     Session Check In - 12/03/15 0837    Check-In   Location [facility]-Cardiac & Pulmonary Rehab   Staff Present Nyoka Cowden, RN;Jessica Luan Pulling, MA, ACSM RCEP, Exercise Physiologist;Amanda Oletta Darter, BA, ACSM CEP, Exercise Physiologist;Monzerat Handler Joya Gaskins, RN, BSN   Supervising physician immediately available to respond to emergencies See telemetry face sheet for immediately available ER MD   Medication changes reported     Yes   Comments Brilinta changed to Plavix per patient.     Warm-up and Cool-down Performed on first and last piece of equipment   Resistance Training Performed Yes   VAD Patient? No   Pain Assessment   Currently in Pain? No/denies         Goals Met:  Proper associated with RPD/PD & O2 Sat Exercise tolerated well No report of cardiac concerns or symptoms Strength training completed today  Goals Unmet:  Not Applicable  Comments: Pt able to follow exercise prescription today without complaint.  Will continue to monitor for progression.    Dr. Emily Filbert is Medical Director for [place] and [facility].

## 2015-12-04 LAB — GLUCOSE, CAPILLARY: GLUCOSE-CAPILLARY: 138 mg/dL — AB (ref 65–99)

## 2015-12-05 ENCOUNTER — Encounter: Payer: Medicare Other | Attending: Cardiovascular Disease

## 2015-12-05 DIAGNOSIS — I214 Non-ST elevation (NSTEMI) myocardial infarction: Secondary | ICD-10-CM | POA: Insufficient documentation

## 2015-12-05 DIAGNOSIS — Z9861 Coronary angioplasty status: Secondary | ICD-10-CM | POA: Insufficient documentation

## 2015-12-08 ENCOUNTER — Encounter: Payer: Medicare Other | Admitting: *Deleted

## 2015-12-08 DIAGNOSIS — Z1379 Encounter for other screening for genetic and chromosomal anomalies: Secondary | ICD-10-CM | POA: Diagnosis not present

## 2015-12-08 DIAGNOSIS — I213 ST elevation (STEMI) myocardial infarction of unspecified site: Secondary | ICD-10-CM

## 2015-12-08 DIAGNOSIS — I214 Non-ST elevation (NSTEMI) myocardial infarction: Secondary | ICD-10-CM | POA: Diagnosis not present

## 2015-12-08 DIAGNOSIS — Z8543 Personal history of malignant neoplasm of ovary: Secondary | ICD-10-CM | POA: Diagnosis not present

## 2015-12-08 DIAGNOSIS — Z803 Family history of malignant neoplasm of breast: Secondary | ICD-10-CM | POA: Diagnosis not present

## 2015-12-08 DIAGNOSIS — Z9861 Coronary angioplasty status: Secondary | ICD-10-CM

## 2015-12-08 DIAGNOSIS — Z315 Encounter for genetic counseling: Secondary | ICD-10-CM | POA: Diagnosis not present

## 2015-12-08 LAB — GLUCOSE, CAPILLARY: GLUCOSE-CAPILLARY: 137 mg/dL — AB (ref 65–99)

## 2015-12-08 NOTE — Progress Notes (Signed)
Daily Session Note  Patient Details  Name: Felicia Woods MRN: [id number] Date of Birth: Aug 08, 1955 Referring Provider:            Cardiac Rehab from 11/26/2015 in [facility] Cardiac and Pulmonary Rehab   Referring Provider  Quay Burow MD      Encounter Date: 12/08/2015  Check In:     Session Check In - 12/08/15 0905    Check-In   Location [facility]-Cardiac & Pulmonary Rehab   Staff Present Alberteen Sam, MA, ACSM RCEP, Exercise Physiologist;Susanne Bice, RN, BSN, Laveda Norman, BS, ACSM CEP, Exercise Physiologist   Supervising physician immediately available to respond to emergencies See telemetry face sheet for immediately available ER MD   Medication changes reported     Yes   Comments Brilinta changed to Plavix to decrease side effects (fatigue)   Fall or balance concerns reported    No   Warm-up and Cool-down Performed on first and last piece of equipment   Resistance Training Performed No   VAD Patient? No   Pain Assessment   Currently in Pain? No/denies   Multiple Pain Sites No         Goals Met:  Independence with exercise equipment Exercise tolerated well No report of cardiac concerns or symptoms Reviewed exercise changes  Goals Unmet:  Not Applicable  Comments: Reviewed individual exercise prescription with patient.  Changes were noted and patient was able to perform at new work load without signs or symptoms.    Dr. Emily Filbert is Medical Director for [place] and [facility].

## 2015-12-10 DIAGNOSIS — Z9861 Coronary angioplasty status: Secondary | ICD-10-CM

## 2015-12-10 DIAGNOSIS — I214 Non-ST elevation (NSTEMI) myocardial infarction: Secondary | ICD-10-CM | POA: Diagnosis not present

## 2015-12-10 DIAGNOSIS — I213 ST elevation (STEMI) myocardial infarction of unspecified site: Secondary | ICD-10-CM

## 2015-12-10 LAB — GLUCOSE, CAPILLARY
GLUCOSE-CAPILLARY: 139 mg/dL — AB (ref 65–99)
Glucose-Capillary: 143 mg/dL — ABNORMAL HIGH (ref 65–99)

## 2015-12-10 NOTE — Progress Notes (Signed)
Daily Session Note  Patient Details  Name: Felicia Woods MRN: [id number] Date of Birth: 1955/11/03 Referring Provider:        Cardiac Rehab from 11/26/2015 in [facility] Cardiac and Pulmonary Rehab   Referring Provider  Quay Burow MD      Encounter Date: 12/10/2015  Check In:     Session Check In - 12/10/15 0911    Check-In   Location [facility]-Cardiac & Pulmonary Rehab   Staff Present Gerlene Burdock, RN, BSN;Jessica Luan Pulling, MA, ACSM RCEP, Exercise Physiologist;Karena Kinker Oletta Darter, BA, ACSM CEP, Exercise Physiologist   Supervising physician immediately available to respond to emergencies See telemetry face sheet for immediately available ER MD   Medication changes reported     No   Fall or balance concerns reported    No   Warm-up and Cool-down Performed on first and last piece of equipment   Resistance Training Performed Yes   VAD Patient? No   Pain Assessment   Currently in Pain? No/denies   Multiple Pain Sites No         Goals Met:  Independence with exercise equipment Changing diet to healthy choices, watching portion sizes No report of cardiac concerns or symptoms Strength training completed today  Goals Unmet:  Not Applicable  Comments: Pt able to follow exercise prescription today without complaint.  Will continue to monitor for progression.    Dr. Emily Filbert is Medical Director for [place] and [facility].

## 2015-12-12 ENCOUNTER — Encounter: Payer: Medicare Other | Admitting: *Deleted

## 2015-12-12 DIAGNOSIS — I213 ST elevation (STEMI) myocardial infarction of unspecified site: Secondary | ICD-10-CM

## 2015-12-12 DIAGNOSIS — I214 Non-ST elevation (NSTEMI) myocardial infarction: Secondary | ICD-10-CM | POA: Diagnosis not present

## 2015-12-12 DIAGNOSIS — Z9861 Coronary angioplasty status: Secondary | ICD-10-CM | POA: Diagnosis not present

## 2015-12-12 NOTE — Progress Notes (Signed)
Daily Session Note  Patient Details  Name: Micaylah Bertucci MRN: [id number] Date of Birth: [birth year] Referring Provider:        Cardiac Rehab from 11/26/2015 in [facility] Cardiac and Pulmonary Rehab   Referring Provider  Quay Burow MD      Encounter Date: 12/12/2015  Check In:     Session Check In - 12/12/15 0852    Check-In   Location [facility]-Cardiac & Pulmonary Rehab   Staff Present Heath Lark, RN, BSN, CCRP;Exodus Kutzer, RN, Levie Heritage, MA, ACSM RCEP, Exercise Physiologist   Supervising physician immediately available to respond to emergencies See telemetry face sheet for immediately available ER MD   Medication changes reported     No   Fall or balance concerns reported    No   Warm-up and Cool-down Performed on first and last piece of equipment   Resistance Training Performed Yes   VAD Patient? No   Pain Assessment   Currently in Pain? No/denies         Goals Met:  Proper associated with RPD/PD & O2 Sat Exercise tolerated well No report of cardiac concerns or symptoms  Goals Unmet:  Not Applicable  Comments:     Dr. Emily Filbert is Medical Director for [place] and [facility].

## 2015-12-15 ENCOUNTER — Encounter: Payer: Medicare Other | Admitting: *Deleted

## 2015-12-15 DIAGNOSIS — I214 Non-ST elevation (NSTEMI) myocardial infarction: Secondary | ICD-10-CM | POA: Diagnosis not present

## 2015-12-15 DIAGNOSIS — Z9861 Coronary angioplasty status: Secondary | ICD-10-CM

## 2015-12-15 DIAGNOSIS — I213 ST elevation (STEMI) myocardial infarction of unspecified site: Secondary | ICD-10-CM

## 2015-12-15 NOTE — Progress Notes (Signed)
Daily Session Note  Patient Details  Name: Felicia Woods MRN: [id number] Date of Birth: June 18, 1956 Referring Provider:        Cardiac Rehab from 11/26/2015 in [facility] Cardiac and Pulmonary Rehab   Referring Provider  Quay Burow MD      Encounter Date: 12/15/2015  Check In:     Session Check In - 12/15/15 0913    Check-In   Location [facility]-Cardiac & Pulmonary Rehab   Staff Present Alberteen Sam, MA, ACSM RCEP, Exercise Physiologist;Susanne Bice, RN, BSN, Laveda Norman, BS, ACSM CEP, Exercise Physiologist;Other   Supervising physician immediately available to respond to emergencies See telemetry face sheet for immediately available ER MD   Medication changes reported     No   Fall or balance concerns reported    No   Warm-up and Cool-down Performed on first and last piece of equipment   Resistance Training Performed No   VAD Patient? No   Pain Assessment   Currently in Pain? No/denies   Multiple Pain Sites No         Goals Met:  Independence with exercise equipment Exercise tolerated well No report of cardiac concerns or symptoms  Goals Unmet:  Not Applicable  Comments: Pt able to follow exercise prescription today without complaint.  Will continue to monitor for progression.    Dr. Emily Filbert is Medical Director for [place] and [facility].

## 2015-12-17 ENCOUNTER — Encounter: Payer: Medicare Other | Admitting: *Deleted

## 2015-12-19 ENCOUNTER — Encounter: Payer: Medicare Other | Admitting: *Deleted

## 2015-12-19 DIAGNOSIS — I213 ST elevation (STEMI) myocardial infarction of unspecified site: Secondary | ICD-10-CM

## 2015-12-19 DIAGNOSIS — Z9861 Coronary angioplasty status: Secondary | ICD-10-CM | POA: Diagnosis not present

## 2015-12-19 DIAGNOSIS — I214 Non-ST elevation (NSTEMI) myocardial infarction: Secondary | ICD-10-CM | POA: Diagnosis not present

## 2015-12-19 LAB — GLUCOSE, CAPILLARY: GLUCOSE-CAPILLARY: 157 mg/dL — AB (ref 65–99)

## 2015-12-19 NOTE — Progress Notes (Deleted)
Daily Session Note  Patient Details  Name: Ariona Deschene MRN: [id number] Date of Birth: 1955/11/23 Referring Provider:        Cardiac Rehab from 11/26/2015 in [facility] Cardiac and Pulmonary Rehab   Referring Provider  Quay Burow MD      Encounter Date: 12/19/2015  Check In:     Session Check In - 12/19/15 0836    Check-In   Location [facility]-Cardiac & Pulmonary Rehab   Staff Present Gerlene Burdock, RN, Vickki Hearing, BA, ACSM CEP, Exercise Physiologist;Jessica Luan Pulling, MA, ACSM RCEP, Exercise Physiologist   Supervising physician immediately available to respond to emergencies See telemetry face sheet for immediately available ER MD   Medication changes reported     No   Fall or balance concerns reported    No   Warm-up and Cool-down Performed on first and last piece of equipment   Resistance Training Performed No   VAD Patient? No   Pain Assessment   Currently in Pain? No/denies         Goals Met:  Proper associated with RPD/PD & O2 Sat Exercise tolerated well  Goals Unmet:  Not Applicable  Comments:     Dr. Emily Filbert is Medical Director for [place] and [facility].

## 2015-12-19 NOTE — Progress Notes (Signed)
Daily Session Note  Patient Details  Name: Felicia Woods MRN: [id number] Date of Birth: August 24, 1955 Referring Provider:        Cardiac Rehab from 11/26/2015 in [facility] Cardiac and Pulmonary Rehab   Referring Provider  Quay Burow MD      Encounter Date: 12/19/2015  Check In:     Session Check In - 12/19/15 0836    Check-In   Location [facility]-Cardiac & Pulmonary Rehab   Staff Present Gerlene Burdock, RN, Vickki Hearing, BA, ACSM CEP, Exercise Physiologist;Jessica Luan Pulling, MA, ACSM RCEP, Exercise Physiologist   Supervising physician immediately available to respond to emergencies See telemetry face sheet for immediately available ER MD   Medication changes reported     No   Fall or balance concerns reported    No   Warm-up and Cool-down Performed on first and last piece of equipment   Resistance Training Performed No   VAD Patient? No   Pain Assessment   Currently in Pain? No/denies         Goals Met:  Proper associated with RPD/PD & O2 Sat  Goals Unmet:  BP  Comments: Sundy got dizzy on the treadmill so stopped and sat down. Blood pressure was 102/70 so given 240 cc of water then it was 102/60. Blood sugar was 158. After 2nd cup of water 240 cc blood pressure was up to 114/70. Aitanna said she is just very tired since "how do you get any sleep with 4 children sleeping in a bed with me. My son is working 2 jobs so I have them for the summer although they do get to go to day care so Landis said she was going back home to go to sleep.   Dr. Emily Filbert is Medical Director for [place] and [facility].

## 2015-12-19 NOTE — Progress Notes (Signed)
Daily Session Note  Patient Details  Name: Placida Cambre MRN: [id number] Date of Birth: Oct 26, 1955 Referring Provider:        Cardiac Rehab from 11/26/2015 in [facility] Cardiac and Pulmonary Rehab   Referring Provider  Quay Burow MD      Encounter Date: 12/19/2015  Check In:     Session Check In - 12/19/15 0833    Check-In   Location [facility]-Cardiac & Pulmonary Rehab   Staff Present Alberteen Sam, MA, ACSM RCEP, Exercise Physiologist;Susanne Bice, RN, BSN, CCRP;Helayna Dun, RN, BSN   Medication changes reported     No   Fall or balance concerns reported    No   Warm-up and Cool-down Performed on first and last piece of equipment   Resistance Training Performed No  Brenya arrives later since she drops off her grandchildren at day care.    VAD Patient? No   Pain Assessment   Currently in Pain? No/denies         Goals Met:  Proper associated with RPD/PD & O2 Sat Exercise tolerated well No report of cardiac concerns or symptoms  Goals Unmet:  Not Applicable  Comments:     Dr. Emily Filbert is Medical Director for [place] and [facility].

## 2015-12-19 NOTE — Progress Notes (Signed)
Daily Session Note  Patient Details  Name: Felicia Woods MRN: [id number] Date of Birth: 15-Nov-1955 Referring Provider:        Cardiac Rehab from 11/26/2015 in [facility] Cardiac and Pulmonary Rehab   Referring Provider  Quay Burow MD      Encounter Date: 12/17/2015  Check In:     Session Check In - 12/19/15 0836    Check-In   Location [facility]-Cardiac & Pulmonary Rehab   Staff Present Gerlene Burdock, RN, Vickki Hearing, BA, ACSM CEP, Exercise Physiologist;Jessica Luan Pulling, MA, ACSM RCEP, Exercise Physiologist   Supervising physician immediately available to respond to emergencies See telemetry face sheet for immediately available ER MD   Medication changes reported     No   Fall or balance concerns reported    No   Warm-up and Cool-down Performed on first and last piece of equipment   Resistance Training Performed No   VAD Patient? No   Pain Assessment   Currently in Pain? No/denies         Goals Met:  Proper associated with RPD/PD & O2 Sat Exercise tolerated well No report of cardiac concerns or symptoms  Goals Unmet:  Not Applicable  Comments:     Dr. Emily Filbert is Medical Director for [place] and [facility].

## 2015-12-22 ENCOUNTER — Encounter: Payer: Medicare Other | Admitting: *Deleted

## 2015-12-22 DIAGNOSIS — Z9861 Coronary angioplasty status: Secondary | ICD-10-CM | POA: Diagnosis not present

## 2015-12-22 DIAGNOSIS — I214 Non-ST elevation (NSTEMI) myocardial infarction: Secondary | ICD-10-CM | POA: Diagnosis not present

## 2015-12-22 DIAGNOSIS — I213 ST elevation (STEMI) myocardial infarction of unspecified site: Secondary | ICD-10-CM

## 2015-12-22 NOTE — Progress Notes (Signed)
Daily Session Note  Patient Details  Name: Felicia Woods MRN: [id number] Date of Birth: Nov 27, 1955 Referring Provider:        Cardiac Rehab from 11/26/2015 in [facility] Cardiac and Pulmonary Rehab   Referring Provider  Quay Burow MD      Encounter Date: 12/22/2015  Check In:     Session Check In - 12/22/15 1003    Check-In   Location [facility]-Cardiac & Pulmonary Rehab   Staff Present Heath Lark, RN, BSN, CCRP;Marisella Puccio [place], MA, ACSM RCEP, Exercise Physiologist;Kelly Amedeo Plenty, BS, ACSM CEP, Exercise Physiologist   Supervising physician immediately available to respond to emergencies See telemetry face sheet for immediately available ER MD   Medication changes reported     No   Fall or balance concerns reported    No   Warm-up and Cool-down Performed on first and last piece of equipment   Resistance Training Performed Yes   VAD Patient? No   Pain Assessment   Currently in Pain? No/denies   Multiple Pain Sites No         Goals Met:  Independence with exercise equipment Exercise tolerated well Personal goals reviewed No report of cardiac concerns or symptoms Strength training completed today  Goals Unmet:  Not Applicable  Comments: Pt able to follow exercise prescription today without complaint.  Will continue to monitor for progression.    Dr. Emily Filbert is Medical Director for [place] and [facility].

## 2015-12-24 ENCOUNTER — Encounter: Payer: Medicare Other | Admitting: *Deleted

## 2015-12-24 ENCOUNTER — Encounter: Payer: Self-pay | Admitting: *Deleted

## 2015-12-24 DIAGNOSIS — Z9861 Coronary angioplasty status: Secondary | ICD-10-CM | POA: Diagnosis not present

## 2015-12-24 DIAGNOSIS — I213 ST elevation (STEMI) myocardial infarction of unspecified site: Secondary | ICD-10-CM

## 2015-12-24 DIAGNOSIS — I214 Non-ST elevation (NSTEMI) myocardial infarction: Secondary | ICD-10-CM | POA: Diagnosis not present

## 2015-12-24 NOTE — Progress Notes (Signed)
Cardiac Individual Treatment Plan  Patient Details  Name: Zamiyah Resendes MRN: [id number] Date of Birth: 1956-03-22 Referring Provider:        Cardiac Rehab from 11/26/2015 in [facility] Cardiac and Pulmonary Rehab   Referring Provider  Quay Burow MD      Initial Encounter Date:       Cardiac Rehab from 11/26/2015 in [facility] Cardiac and Pulmonary Rehab   Date  11/26/15   Referring Provider  Quay Burow MD      Visit Diagnosis: ST elevation myocardial infarction (STEMI), unspecified artery ([place])  S/P PTCA (percutaneous transluminal coronary angioplasty)  Patient's Home Medications on Admission:  Current outpatient prescriptions:  .  amLODipine (NORVASC) 10 MG tablet, Take 1 tablet (10 mg total) by mouth daily., Disp: 30 tablet, Rfl: 3 .  aspirin EC 81 MG tablet, Take 81 mg by mouth daily., Disp: , Rfl:  .  atorvastatin (LIPITOR) 80 MG tablet, Take 1 tablet (80 mg total) by mouth daily at 6 PM., Disp: 30 tablet, Rfl: 3 .  augmented betamethasone dipropionate (DIPROLENE-AF) 0.05 % cream, Apply 1 application topically daily. Reported on 11/18/2015, Disp: , Rfl:  .  carvedilol (COREG) 3.125 MG tablet, Take 1 tablet (3.125 mg total) by mouth 2 (two) times daily with a meal., Disp: 60 tablet, Rfl: 3 .  clopidogrel (PLAVIX) 75 MG tablet, Take 75 mg by mouth daily., Disp: , Rfl:  .  cyclobenzaprine (FLEXERIL) 10 MG tablet, Take 10 mg by mouth at bedtime. , Disp: , Rfl:  .  estradiol (ESTRACE) 0.5 MG tablet, Take 0.5 mg by mouth daily. , Disp: , Rfl:  .  furosemide (LASIX) 40 MG tablet, Take 40 mg by mouth daily. , Disp: , Rfl:  .  HYDROcodone-acetaminophen (NORCO/VICODIN) 5-325 MG tablet, Take 1 tablet by mouth every 8 (eight) hours as needed. For pain., Disp: , Rfl:  .  losartan (COZAAR) 50 MG tablet, Take 50 mg by mouth daily. , Disp: , Rfl:  .  meclizine (ANTIVERT) 25 MG tablet, Take 1 tablet (25 mg total) by mouth 3 (three) times daily as needed for dizziness. (Patient not taking: Reported  on 11/18/2015), Disp: 30 tablet, Rfl: 0 .  metFORMIN (GLUCOPHAGE) 500 MG tablet, Take 250 mg by mouth 2 (two) times daily with a meal. , Disp: , Rfl:  .  montelukast (SINGULAIR) 10 MG tablet, Take 10 mg by mouth daily. , Disp: , Rfl:  .  nitroGLYCERIN (NITROSTAT) 0.4 MG SL tablet, Place 0.4 mg under the tongue every 5 (five) minutes x 3 doses as needed for chest pain. *If no relief call MD or go to Emergency Room*, Disp: , Rfl:  .  raloxifene (EVISTA) 60 MG tablet, Take 60 mg by mouth daily. , Disp: , Rfl:  .  ranitidine (ZANTAC) 150 MG tablet, Take 150 mg by mouth daily. , Disp: , Rfl:  .  sertraline (ZOLOFT) 100 MG tablet, Take 100 mg by mouth daily. , Disp: , Rfl:  .  ticagrelor (BRILINTA) 90 MG TABS tablet, Take 1 tablet (90 mg total) by mouth 2 (two) times daily. (Patient not taking: Reported on 12/03/2015), Disp: 60 tablet, Rfl: 11 .  VENTOLIN HFA 108 (90 BASE) MCG/ACT inhaler, Inhale 2 puffs into the lungs every 6 (six) hours as needed for wheezing or shortness of breath. , Disp: , Rfl:  .  VESICARE 10 MG tablet, Take 10 mg by mouth daily. , Disp: , Rfl:  .  zolpidem (AMBIEN) 10 MG tablet, Take 10 mg by  mouth at bedtime., Disp: , Rfl:   Past Medical History: Past Medical History  Diagnosis Date  . Hypertension   . Diabetes mellitus without complication ([place])   . MI (myocardial infarction) ([place])   . Coronary artery disease     Tobacco Use: History  Smoking status  . Never Smoker   Smokeless tobacco  . Not on file    Labs: Recent Review Flowsheet Data    Labs for ITP Cardiac and Pulmonary Rehab Latest Ref Rng 10/18/2015 10/20/2015   Cholestrol 0 - 200 mg/dL - 153   LDLCALC 0 - 99 mg/dL - 97   HDL >40 mg/dL - 31(L)   Trlycerides <150 mg/dL - 123   TCO2 0 - 100 mmol/L 21 -       Exercise Target Goals:    Exercise Program Goal: Individual exercise prescription set with THRR, safety & activity barriers. Participant demonstrates ability to understand and report RPE using BORG  scale, to self-measure pulse accurately, and to acknowledge the importance of the exercise prescription.  Exercise Prescription Goal: Starting with aerobic activity 30 plus minutes a day, 3 days per week for initial exercise prescription. Provide home exercise prescription and guidelines that participant acknowledges understanding prior to discharge.  Activity Barriers & Risk Stratification:     Activity Barriers & Cardiac Risk Stratification - 11/18/15 1213    Activity Barriers & Cardiac Risk Stratification   Activity Barriers Shortness of Breath;Chest Pain/Angina  Squeezing in the chest and SOB with exertion, resting resovles symptoms.  Can occur 2-3 times a day.  MD is aware.   Cardiac Risk Stratification High      6 Minute Walk:     6 Minute Walk      11/18/15 1458 11/18/15 1503     6 Minute Walk   Phase Initial     Distance 770 feet     Walk Time 4 minutes     # of Rest Breaks 1     RPE 13     Symptoms  Yes (comment)    Comments  Pt stopped at 4 minutes of walk, stating "chest tightness" which resolved with rest.     Resting HR 94 bpm     Resting BP 126/70 mmHg     Max Ex. HR 122 bpm     Max Ex. BP 134/72 mmHg        Initial Exercise Prescription:     Initial Exercise Prescription - 11/26/15 0900    Date of Initial Exercise RX and Referring Provider   Date 11/26/15   Referring Provider Quay Burow MD   Treadmill   MPH 1.5   Grade 0   Minutes 15   Cybex   Level 10   RPM 25   Minutes 5   T5 Nustep   Level 3   Watts 26   Minutes 15   Intensity   THRR REST +  30   Ratings of Perceived Exertion 11-15   Perceived Dyspnea 0-4   Progression   Progression Continue to progress workloads to maintain intensity without signs/symptoms of physical distress.   Resistance Training   Training Prescription Yes   Weight 2   Reps 10-15      Perform Capillary Blood Glucose checks as needed.  Exercise Prescription Changes:     Exercise Prescription Changes       11/18/15 1300 12/03/15 1100 12/17/15 1400       Exercise Review   Progression  Yes Yes  Response to Exercise   Blood Pressure (Admit) 126/60 mmHg 112/74 mmHg 142/80 mmHg     Blood Pressure (Exercise) 134/72 mmHg 128/60 mmHg 158/88 mmHg     Blood Pressure (Exit) 132/82 mmHg 102/68 mmHg 124/72 mmHg     Heart Rate (Admit) 94 bpm 73 bpm 89 bpm     Heart Rate (Exercise) 122 bpm 109 bpm 156 bpm     Heart Rate (Exit) 85 bpm 91 bpm 102 bpm     Rating of Perceived Exertion (Exercise)  13 14     Symptoms  none none     Duration  Progress to 30 minutes of continuous aerobic without signs/symptoms of physical distress Progress to 45 minutes of aerobic exercise without signs/symptoms of physical distress     Intensity  THRR New  117-147 THRR unchanged     Progression   Progression  Continue to progress workloads to maintain intensity without signs/symptoms of physical distress. Continue to progress workloads to maintain intensity without signs/symptoms of physical distress.     Average METs  2.43 3.3     Resistance Training   Training Prescription  Yes Yes     Weight  2 2     Reps  10-15 10-15     Interval Training   Interval Training  No No     Treadmill   MPH  2.3 2.6     Grade  0 2.5     Minutes  15 20     NuStep   Level   4     Watts   60     Minutes   15     Cybex   Level  10      RPM  25      Minutes  5      REL-XR   Level   3     Watts   83     Minutes   15     T5 Nustep   Level  4      Watts  20      Minutes  15         Exercise Comments:     Exercise Comments      11/18/15 1502 12/03/15 1105 12/08/15 0906 12/17/15 1408 12/19/15 0859   Exercise Comments Patient limited in 6 minute walk by "chest tightness" which resolved with rest Pt is doing well after second full day of exercise.  She has not had any recurring symptoms noted.  She is making progress in her workloads.  We will work on continuing to increase treadmill and trying other pieces of equipment.  Reviewed new workloads with pt.  She was ameanable to changes.  We will add incline to treadmill at next visit as she wanted to walk faster today.  She tolerated changes well without signs or symptoms. Tayna is doing well with exercise.  She is making good improvements and is up to 20 min now on treadmill without signs or symptoms!  Will continue to monitor for progress. Analycia got dizzy on the treadmill so stopped and sat down. Blood pressure was 102/70 so given 240 cc of water then it was 102/60. Blood sugar was 158. After 2nd cup of water 240 cc blood pressure was up to 114/70. Hazelgrace said she is just very tired since "how do you get any sleep with 4 children sleeping in a bed with me. My son is working 2 jobs so I have them for the summer although they  do get to go to day care so Jolena said she was going back home to go to sleep.       Discharge Exercise Prescription (Final Exercise Prescription Changes):     Exercise Prescription Changes - 12/17/15 1400    Exercise Review   Progression Yes   Response to Exercise   Blood Pressure (Admit) 142/80 mmHg   Blood Pressure (Exercise) 158/88 mmHg   Blood Pressure (Exit) 124/72 mmHg   Heart Rate (Admit) 89 bpm   Heart Rate (Exercise) 156 bpm   Heart Rate (Exit) 102 bpm   Rating of Perceived Exertion (Exercise) 14   Symptoms none   Duration Progress to 45 minutes of aerobic exercise without signs/symptoms of physical distress   Intensity THRR unchanged   Progression   Progression Continue to progress workloads to maintain intensity without signs/symptoms of physical distress.   Average METs 3.3   Resistance Training   Training Prescription Yes   Weight 2   Reps 10-15   Interval Training   Interval Training No   Treadmill   MPH 2.6   Grade 2.5   Minutes 20   NuStep   Level 4   Watts 60   Minutes 15   REL-XR   Level 3   Watts 83   Minutes 15      Nutrition:  Target Goals: Understanding of nutrition guidelines, daily intake of  sodium '1500mg'$ , cholesterol '200mg'$ , calories 30% from fat and 7% or less from saturated fats, daily to have 5 or more servings of fruits and vegetables.  Biometrics:     Pre Biometrics - 11/18/15 1457    Pre Biometrics   Height 5' 6.25" (1.683 m)   Weight 253 lb (114.76 kg)   Waist Circumference 49 inches   Hip Circumference 55 inches   Waist to Hip Ratio 0.89 %   BMI (Calculated) 40.6       Nutrition Therapy Plan and Nutrition Goals:     Nutrition Therapy & Goals - 12/12/15 1110    Nutrition Therapy   Diet Instructed on a meal plan based on dietary guidelines for diabetes and DASH diet principles based on 1600 calories.   Drug/Food Interactions Statins/Certain Fruits   Protein (specify units) 6   Fiber 20 grams   Whole Grain Foods 3 servings   Saturated Fats 11 max. grams   Fruits and Vegetables 5 servings/day   Sodium 1500 grams   Personal Nutrition Goals   Personal Goal #1 Balance meals with protein, 1-2 starch servings, non-starchy vegetables. Can add a small serving of fruit- Refer to handout.   Personal Goal #2 Increase "free/non-starchy" vegetables.   Personal Goal #3 Read labels for saturated fat and trans fat and sodium.   Intervention Plan   Intervention Prescribe, educate and counsel regarding individualized specific dietary modifications aiming towards targeted core components such as weight, hypertension, lipid management, diabetes, heart failure and other comorbidities.;Nutrition handout(s) given to patient.   Expected Outcomes Short Term Goal: Understand basic principles of dietary content, such as calories, fat, sodium, cholesterol and nutrients.;Long Term Goal: Adherence to prescribed nutrition plan.;Short Term Goal: A plan has been developed with personal nutrition goals set during dietitian appointment.      Nutrition Discharge: Rate Your Plate Scores:     Nutrition Assessments - 12/12/15 1114    Rate Your Plate Scores   Pre Score 52   Pre Score % 58 %       Nutrition Goals Re-Evaluation:   Psychosocial: Target Goals:  Acknowledge presence or absence of depression, maximize coping skills, provide positive support system. Participant is able to verbalize types and ability to use techniques and skills needed for reducing stress and depression.  Initial Review & Psychosocial Screening:     Initial Psych Review & Screening - 11/18/15 1230    Initial Review   Current issues with Current Sleep Concerns  Takes sleeping pills and they do not help.MD is aware as of today.Marland Kitchen Apt stairs and symptoms, MD has written letter to help Melisa move to first level apartment   [place]? Yes  Husband and 2 sons and a daughter   Barriers   Psychosocial barriers to participate in program There are no identifiable barriers or psychosocial needs.   Screening Interventions   Interventions Encouraged to exercise      Quality of Life Scores:     Quality of Life - 11/18/15 1651    Quality of Life Scores   Health/Function Pre 17.3 %   Socioeconomic Pre 15.58 %   Psych/Spiritual Pre 25.71 %   Family Pre 25.2 %   GLOBAL Pre 19.97 %      PHQ-9:     Recent Review Flowsheet Data    Depression screen [facility] 2/9 11/18/2015 02/10/2015   Decreased Interest 1 0   Down, Depressed, Hopeless 2 0    PHQ - 2 Score 3 0   Altered sleeping 3 -   Tired, decreased energy 3 -   Change in appetite 2 -   Feeling bad or failure about yourself  0 -   Trouble concentrating 0 -   Moving slowly or fidgety/restless 0 -   Suicidal thoughts 0 -   PHQ-9 Score 11 -   Difficult doing work/chores Somewhat difficult -      Psychosocial Evaluation and Intervention:     Psychosocial Evaluation - 11/26/15 0937    Psychosocial Evaluation & Interventions   Interventions Stress management education;Relaxation education;Encouraged to exercise with the program and follow exercise prescription   Comments Counselor met with Ms. Carvalho today for initial  psychosocial evaluation.  She is a 60 year old who had a heart attack and stent inserted on 4/15.  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  She has a strong support system with a spouse, adult children who live close by and active participation in her church community.  Ms. Feldstein reports not sleeping well but was just prescribed Ambien for this and has not yet begun taking it.  She denies a history of depression or anxiety or current symptoms.  She states her mood is typically positive even though she has multiple health issues and her father, who lives in the mountains of [place], has bone cancer, which is stressful for Ms. Stuck at times.  She has goals to improve her health overall, to be educated on healthier eating/nutrition, and to lose some weight while in this program.  She plans to look into a follow up program here at [facility] upon completion of this program.     Continued Psychosocial Services Needed Yes  Ms. Jankowiak will benefit from psychoeducation on stress and relaxation while in this program.       Psychosocial Re-Evaluation:     Psychosocial Re-Evaluation      12/19/15 0835           Psychosocial Re-Evaluation   Interventions Encouraged to attend Cardiac Rehabilitation for the exercise  Comments Elysabeth arrives later since she drops off her grandchildren at day care. Arrow is "unable to attend the 4pm Cardiac Rehab class since she is watching her grandchildren since her husband works." Brexlee has been able to attend all 3 days this week of [place].           Vocational Rehabilitation: Provide vocational rehab assistance to qualifying candidates.   Vocational Rehab Evaluation & Intervention:     Vocational Rehab - 11/18/15 1220    Initial Vocational Rehab Evaluation & Intervention   Assessment shows need for Vocational Rehabilitation No      Education: Education Goals: Education classes will be provided on a weekly basis, covering  required topics. Participant will state understanding/return demonstration of topics presented.  Learning Barriers/Preferences:     Learning Barriers/Preferences - 11/18/15 1219    Learning Barriers/Preferences   Learning Barriers None   Learning Preferences Individual Instruction;Verbal Instruction      Education Topics: General Nutrition Guidelines/Fats and Fiber: -Group instruction provided by verbal, written material, models and posters to present the general guidelines for heart healthy nutrition. Gives an explanation and review of dietary fats and fiber.          Cardiac Rehab from 12/15/2015 in [facility] Cardiac and Pulmonary Rehab   Date  12/08/15   Educator  Jaclyn Shaggy   Instruction Review Code  2- meets goals/outcomes      Controlling Sodium/Reading Food Labels: -Group verbal and written material supporting the discussion of sodium use in heart healthy nutrition. Review and explanation with models, verbal and written materials for utilization of the food label.      Cardiac Rehab from 12/15/2015 in [facility] Cardiac and Pulmonary Rehab   Date  12/15/15   Educator  CR   Instruction Review Code  2- meets goals/outcomes      Exercise Physiology & Risk Factors: - Group verbal and written instruction with models to review the exercise physiology of the cardiovascular system and associated critical values. Details cardiovascular disease risk factors and the goals associated with each risk factor.   Aerobic Exercise & Resistance Training: - Gives group verbal and written discussion on the health impact of inactivity. On the components of aerobic and resistive training programs and the benefits of this training and how to safely progress through these programs.   Flexibility, Balance, General Exercise Guidelines: - Provides group verbal and written instruction on the benefits of flexibility and balance training programs. Provides general exercise guidelines with specific guidelines to  those with heart or lung disease. Demonstration and skill practice provided.   Stress Management: - Provides group verbal and written instruction about the health risks of elevated stress, cause of high stress, and healthy ways to reduce stress.   Depression: - Provides group verbal and written instruction on the correlation between heart/lung disease and depressed mood, treatment options, and the stigmas associated with seeking treatment.      Cardiac Rehab from 12/15/2015 in [facility] Cardiac and Pulmonary Rehab   Date  12/03/15   Educator  Kathreen Cornfield, [facility]   Instruction Review Code  2- meets goals/outcomes      Anatomy & Physiology of the Heart: - Group verbal and written instruction and models provide basic cardiac anatomy and physiology, with the coronary electrical and arterial systems. Review of: AMI, Angina, Valve disease, Heart Failure, Cardiac Arrhythmia, Pacemakers, and the ICD.   Cardiac Procedures: - Group verbal and written instruction and models to describe the testing methods done to diagnose heart disease.  Reviews the outcomes of the test results. Describes the treatment choices: Medical Management, Angioplasty, or Coronary Bypass Surgery.   Cardiac Medications: - Group verbal and written instruction to review commonly prescribed medications for heart disease. Reviews the medication, class of the drug, and side effects. Includes the steps to properly store meds and maintain the prescription regimen.   Go Sex-Intimacy & Heart Disease, Get SMART - Goal Setting: - Group verbal and written instruction through game format to discuss heart disease and the return to sexual intimacy. Provides group verbal and written material to discuss and apply goal setting through the application of the S.M.A.R.T. Method.   Other Matters of the Heart: - Provides group verbal, written materials and models to describe Heart Failure, Angina, Valve Disease, and Diabetes in the realm of heart  disease. Includes description of the disease process and treatment options available to the cardiac patient.   Exercise & Equipment Safety: - Individual verbal instruction and demonstration of equipment use and safety with use of the equipment.      Cardiac Rehab from 12/15/2015 in [facility] Cardiac and Pulmonary Rehab   Date  11/18/15   Educator  SB   Instruction Review Code  2- meets goals/outcomes      Infection Prevention: - Provides verbal and written material to individual with discussion of infection control including proper hand washing and proper equipment cleaning during exercise session.      Cardiac Rehab from 12/15/2015 in [facility] Cardiac and Pulmonary Rehab   Date  11/18/15   Educator  SB   Instruction Review Code  2- meets goals/outcomes      Falls Prevention: - Provides verbal and written material to individual with discussion of falls prevention and safety.      Cardiac Rehab from 12/15/2015 in [facility] Cardiac and Pulmonary Rehab   Date  11/18/15   Educator  SB   Instruction Review Code  2- meets goals/outcomes      Diabetes: - Individual verbal and written instruction to review signs/symptoms of diabetes, desired ranges of glucose level fasting, after meals and with exercise. Advice that pre and post exercise glucose checks will be done for 3 sessions at entry of program.      Cardiac Rehab from 12/15/2015 in [facility] Cardiac and Pulmonary Rehab   Date  11/18/15   Educator  SB   Instruction Review Code  2- meets goals/outcomes       Knowledge Questionnaire Score:     Knowledge Questionnaire Score - 11/18/15 1651    Knowledge Questionnaire Score   Pre Score 14/28      Core Components/Risk Factors/Patient Goals at Admission:     Personal Goals and Risk Factors at Admission - 11/18/15 1220    Core Components/Risk Factors/Patient Goals on Admission    Weight Management Yes;Obesity   Intervention Weight Management: Develop a combined nutrition and exercise program  designed to reach desired caloric intake, while maintaining appropriate intake of nutrient and fiber, sodium and fats, and appropriate energy expenditure required for the weight goal.;Weight Management: Provide education and appropriate resources to help participant work on and attain dietary goals.;Weight Management/Obesity: Establish reasonable short term and long term weight goals.;Obesity: Provide education and appropriate resources to help participant work on and attain dietary goals.   Admit Weight 253 lb (114.76 kg)   Goal Weight: Short Term 245 lb (111.131 kg)   Goal Weight: Long Term 180 lb (81.647 kg)   Expected Outcomes Short Term: Continue to assess and modify interventions until short  term weight is achieved;Long Term: Adherence to nutrition and physical activity/exercise program aimed toward attainment of established weight goal   Sedentary Yes   Intervention Provide advice, education, support and counseling about physical activity/exercise needs.;Develop an individualized exercise prescription for aerobic and resistive training based on initial evaluation findings, risk stratification, comorbidities and participant's personal goals.   Expected Outcomes Achievement of increased cardiorespiratory fitness and enhanced flexibility, muscular endurance and strength shown through measurements of functional capacity and personal statement of participant.   Diabetes Yes   Intervention Provide education about signs/symptoms and action to take for hypo/hyperglycemia.;Provide education about proper nutrition, including hydration, and aerobic/resistive exercise prescription along with prescribed medications to achieve blood glucose in normal ranges: Fasting glucose 65-99 mg/dL   Expected Outcomes Short Term: Participant verbalizes understanding of the signs/symptoms and immediate care of hyper/hypoglycemia, proper foot care and importance of medication, aerobic/resistive exercise and nutrition plan for  blood glucose control.;Long Term: Attainment of HbA1C < 7%.   Hypertension Yes   Intervention Provide education on lifestyle modifcations including regular physical activity/exercise, weight management, moderate sodium restriction and increased consumption of fresh fruit, vegetables, and low fat dairy, alcohol moderation, and smoking cessation.;Monitor prescription use compliance.   Expected Outcomes Short Term: Continued assessment and intervention until BP is < 140/69m HG in hypertensive participants. < 130/816mHG in hypertensive participants with diabetes, heart failure or chronic kidney disease.;Long Term: Maintenance of blood pressure at goal levels.   Lipids Yes   Intervention Provide education and support for participant on nutrition & aerobic/resistive exercise along with prescribed medications to achieve LDL <7033mHDL >76m64m Expected Outcomes Short Term: Participant states understanding of desired cholesterol values and is compliant with medications prescribed. Participant is following exercise prescription and nutrition guidelines.;Long Term: Cholesterol controlled with medications as prescribed, with individualized exercise RX and with personalized nutrition plan. Value goals: LDL < 70mg89mL > 40 mg.   Stress Yes  Lives on second floor with steps only to get into apartment.  Symptoms occur everytime when walking up steps.  HAs had MD write note for her to move to first floor apartment and has taken letter to landlord.    Intervention Offer individual and/or small group education and counseling on adjustment to heart disease, stress management and health-related lifestyle change. Teach and support self-help strategies.   Expected Outcomes Short Term: Participant demonstrates changes in health-related behavior, relaxation and other stress management skills, ability to obtain effective social support, and compliance with psychotropic medications if prescribed.;Long Term: Emotional wellbeing  is indicated by absence of clinically significant psychosocial distress or social isolation.      Core Components/Risk Factors/Patient Goals Review:      Goals and Risk Factor Review      12/19/15 0907 12/22/15 1349         Core Components/Risk Factors/Patient Goals Review   Personal Goals Review Weight Management/Obesity;Sedentary;Hypertension;Lipids Sedentary;Weight Management/Obesity;Increase Strength and Stamina;Hypertension;Lipids;Diabetes;Stress      Review Deb got dizzy on the treadmill so stopped and sat down. Blood pressure was 102/70 so given 240 cc of water then it was 102/60. Blood sugar was 158. After 2nd cup of water 240 cc blood pressure was up to 114/70. Starkeisha said she is just very tired since "how do you get any sleep with 4 children sleeping in a bed with me. My son is working 2 jobs so I have them for the summer although they do get to go to day care so VickiRyanna she was going back  home to go to sleep. Jocelyn Lamer is doing well with exercise.  She comes to class consistently.  Her weights have been averaging between 244-248.  Her blood pressure are in the 120s/60s for resting.  No problems with her lipid medication.  She is not letting stress get the better of her any longer.  She is walk for an hour everyday to and from the mailbox.  He blood sugars have also been going down overall.      Expected Outcomes Cont to exercise 3 times a week.  Deolinda will continue to make improvements across the board.  We will continue to monitor for progress.         Core Components/Risk Factors/Patient Goals at Discharge (Final Review):      Goals and Risk Factor Review - 12/22/15 1349    Core Components/Risk Factors/Patient Goals Review   Personal Goals Review Sedentary;Weight Management/Obesity;Increase Strength and Stamina;Hypertension;Lipids;Diabetes;Stress   Review Jocelyn Lamer is doing well with exercise.  She comes to class consistently.  Her weights have been averaging between 244-248.   Her blood pressure are in the 120s/60s for resting.  No problems with her lipid medication.  She is not letting stress get the better of her any longer.  She is walk for an hour everyday to and from the mailbox.  He blood sugars have also been going down overall.   Expected Outcomes Alysia will continue to make improvements across the board.  We will continue to monitor for progress.      ITP Comments:     ITP Comments      11/18/15 1648 11/23/15 1308 12/19/15 0834 12/19/15 0911 12/22/15 1352   ITP Comments INitial medical evaluation completed   [id number] with ITP To start sesions Monday May 22  30 day review. Continue with ITP. Orientation completed to start sessions on May 22 Jailyne arrives later since she drops off her grandchildren at day care. Cristela is "unable to attend the 4pm Cardiac Rehab class since she is watching her grandchildren since her husband works." Matie has been able to attend all 3 days this week of [place].  Ambriel got dizzy on the treadmill so stopped and sat down. Blood pressure was 102/70 so given 240 cc of water then it was 102/60. Blood sugar was 158. After 2nd cup of water 240 cc blood pressure was up to 114/70. Simona said she is just very tired since "how do you get any sleep with 4 children sleeping in a bed with me. My son is working 2 jobs so I have them for the summer although they do get to go to day care so Dorthea said she was going back home to go to sleep. Since Dezaria arrives late to class, she has not been able to attend all of the education classes.     12/24/15 0730           ITP Comments 30 day review.  Continue with ITP  Continues to arive daily for her exerxie session.  Has 4 grandchildren at home wiht her and she takes all to camp before arriving here for her session.          Comments:

## 2015-12-24 NOTE — Progress Notes (Signed)
Daily Session Note  Patient Details  Name: Felicia Woods MRN: [id number] Date of Birth: [birth year] Referring Provider:        Cardiac Rehab from 11/26/2015 in [facility] Cardiac and Pulmonary Rehab   Referring Provider  Quay Burow MD      Encounter Date: 12/24/2015  Check In:     Session Check In - 12/24/15 0905    Check-In   Location [facility]-Cardiac & Pulmonary Rehab   Staff Present Alberteen Sam, MA, ACSM RCEP, Exercise Physiologist;Amanda Oletta Darter, BA, ACSM CEP, Exercise Physiologist;Carroll Enterkin, RN, BSN   Supervising physician immediately available to respond to emergencies See telemetry face sheet for immediately available ER MD   Medication changes reported     No   Fall or balance concerns reported    No   Warm-up and Cool-down Performed on first and last piece of equipment   Resistance Training Performed Yes   VAD Patient? No   Pain Assessment   Currently in Pain? No/denies   Multiple Pain Sites No         Goals Met:  Independence with exercise equipment Exercise tolerated well No report of cardiac concerns or symptoms Strength training completed today  Goals Unmet:  Not Applicable  Comments: Pt able to follow exercise prescription today without complaint.  Will continue to monitor for progression.    Dr. Emily Filbert is Medical Director for [place] and [facility].

## 2015-12-26 DIAGNOSIS — Z9861 Coronary angioplasty status: Secondary | ICD-10-CM

## 2015-12-26 DIAGNOSIS — I213 ST elevation (STEMI) myocardial infarction of unspecified site: Secondary | ICD-10-CM

## 2015-12-26 DIAGNOSIS — R42 Dizziness and giddiness: Secondary | ICD-10-CM | POA: Diagnosis not present

## 2015-12-26 DIAGNOSIS — I214 Non-ST elevation (NSTEMI) myocardial infarction: Secondary | ICD-10-CM | POA: Diagnosis not present

## 2015-12-26 DIAGNOSIS — R0602 Shortness of breath: Secondary | ICD-10-CM | POA: Diagnosis not present

## 2015-12-26 NOTE — Progress Notes (Signed)
Daily Session Note  Patient Details  Name: Felicia Woods MRN: [id number] Date of Birth: August 13, 1955 Referring Provider:        Cardiac Rehab from 11/26/2015 in [facility] Cardiac and Pulmonary Rehab   Referring Provider  Quay Burow MD      Encounter Date: 12/26/2015  Check In:     Session Check In - 12/26/15 0840    Check-In   Location [facility]-Cardiac & Pulmonary Rehab   Staff Present Gerlene Burdock, RN, Levie Heritage, MA, ACSM RCEP, Exercise Physiologist;Nykira Reddix Brayton El, DPT, CEEA   Supervising physician immediately available to respond to emergencies See telemetry face sheet for immediately available ER MD   Medication changes reported     No   Fall or balance concerns reported    No   Warm-up and Cool-down Performed on first and last piece of equipment   Resistance Training Performed Yes   VAD Patient? No   Pain Assessment   Currently in Pain? No/denies   Multiple Pain Sites No         Goals Met:  Independence with exercise equipment Exercise tolerated well  Goals Unmet:  Not Applicable  Comments: Patient completed exercise prescription and all exercise goals during rehab session. The exercise was tolerated well and the patient is progressing in the program.    Dr. Emily Filbert is Medical Director for [place] and [facility].

## 2015-12-29 ENCOUNTER — Encounter: Payer: Medicare Other | Admitting: *Deleted

## 2015-12-29 DIAGNOSIS — I213 ST elevation (STEMI) myocardial infarction of unspecified site: Secondary | ICD-10-CM

## 2015-12-29 DIAGNOSIS — I25708 Atherosclerosis of coronary artery bypass graft(s), unspecified, with other forms of angina pectoris: Secondary | ICD-10-CM | POA: Diagnosis not present

## 2015-12-29 DIAGNOSIS — R0602 Shortness of breath: Secondary | ICD-10-CM | POA: Diagnosis not present

## 2015-12-29 DIAGNOSIS — E782 Mixed hyperlipidemia: Secondary | ICD-10-CM | POA: Diagnosis not present

## 2015-12-29 DIAGNOSIS — R42 Dizziness and giddiness: Secondary | ICD-10-CM | POA: Diagnosis not present

## 2015-12-29 DIAGNOSIS — E119 Type 2 diabetes mellitus without complications: Secondary | ICD-10-CM | POA: Diagnosis not present

## 2015-12-29 DIAGNOSIS — I5032 Chronic diastolic (congestive) heart failure: Secondary | ICD-10-CM | POA: Diagnosis not present

## 2015-12-29 DIAGNOSIS — I10 Essential (primary) hypertension: Secondary | ICD-10-CM | POA: Diagnosis not present

## 2015-12-29 DIAGNOSIS — I2119 ST elevation (STEMI) myocardial infarction involving other coronary artery of inferior wall: Secondary | ICD-10-CM | POA: Diagnosis not present

## 2015-12-29 DIAGNOSIS — Z6841 Body Mass Index (BMI) 40.0 and over, adult: Secondary | ICD-10-CM | POA: Diagnosis not present

## 2015-12-29 DIAGNOSIS — Z9861 Coronary angioplasty status: Secondary | ICD-10-CM

## 2015-12-29 NOTE — Progress Notes (Signed)
Daily Session Note  Patient Details  Name: Felicia Woods MRN: [id number] Date of Birth: 06/10/1956 Referring Provider:        Cardiac Rehab from 11/26/2015 in [facility] Cardiac and Pulmonary Rehab   Referring Provider  Berry, Jonathan MD      Encounter Date: 12/29/2015  Check In:     Session Check In - 12/29/15 0832    Check-In   Location [facility]-Cardiac & Pulmonary Rehab   Staff Present Susanne Bice, RN, BSN, CCRP;Mary Jo Abernethy, RN, BSN, MA;Kelly Hayes, BS, ACSM CEP, Exercise Physiologist   Supervising physician immediately available to respond to emergencies See telemetry face sheet for immediately available ER MD   Medication changes reported     No   Fall or balance concerns reported    Yes   Comments No falls reported but came in feeling very dizzy today. Was not feeling well and did not exercise today.    Warm-up and Cool-down Not performed (comment)   Resistance Training Performed No   VAD Patient? No   Pain Assessment   Currently in Pain? No/denies   Multiple Pain Sites No         Goals Met:  N/A: Did not exercise today. See ITP comment.  Goals Unmet:  Low blood glucose this morning at home. Blood glucose was taken upon arrival and was 152. Was feeling dizzy today. Had a Dr. Appointment with Dr. Kowalski this morning and was taken to her doctor's office. See ITP comment.   Comments: see above   Dr. Mark Miller is Medical Director for [facility] and [facility]. 

## 2015-12-31 DIAGNOSIS — E782 Mixed hyperlipidemia: Secondary | ICD-10-CM | POA: Diagnosis not present

## 2015-12-31 DIAGNOSIS — I25810 Atherosclerosis of coronary artery bypass graft(s) without angina pectoris: Secondary | ICD-10-CM | POA: Diagnosis not present

## 2015-12-31 DIAGNOSIS — I952 Hypotension due to drugs: Secondary | ICD-10-CM | POA: Diagnosis not present

## 2015-12-31 DIAGNOSIS — Z9861 Coronary angioplasty status: Secondary | ICD-10-CM

## 2015-12-31 DIAGNOSIS — I214 Non-ST elevation (NSTEMI) myocardial infarction: Secondary | ICD-10-CM | POA: Diagnosis not present

## 2015-12-31 DIAGNOSIS — I213 ST elevation (STEMI) myocardial infarction of unspecified site: Secondary | ICD-10-CM

## 2015-12-31 DIAGNOSIS — I2119 ST elevation (STEMI) myocardial infarction involving other coronary artery of inferior wall: Secondary | ICD-10-CM | POA: Diagnosis not present

## 2015-12-31 NOTE — Progress Notes (Signed)
Daily Session Note  Patient Details  Name: Argelia Formisano MRN: [id number] Date of Birth: [birth year] Referring Provider:        Cardiac Rehab from 11/26/2015 in [facility] Cardiac and Pulmonary Rehab   Referring Provider  Quay Burow MD      Encounter Date: 12/31/2015  Check In:     Session Check In - 12/31/15 0944    Check-In   Location [facility]-Cardiac & Pulmonary Rehab   Staff Present Nyoka Cowden, RN, BSN, MA;Carroll Enterkin, RN, Vickki Hearing, BA, ACSM CEP, Exercise Physiologist   Supervising physician immediately available to respond to emergencies See telemetry face sheet for immediately available ER MD   Medication changes reported     No   Fall or balance concerns reported    No   Warm-up and Cool-down Performed on first and last piece of equipment   Resistance Training Performed Yes   VAD Patient? No   Pain Assessment   Currently in Pain? No/denies   Multiple Pain Sites No         Goals Met:  Independence with exercise equipment Exercise tolerated well No report of cardiac concerns or symptoms  Goals Unmet:  Not Applicable  Comments: Pt able to follow exercise prescription today without complaint.  Will continue to monitor for progression.    Dr. Emily Filbert is Medical Director for [place] and [facility].

## 2016-01-02 ENCOUNTER — Encounter: Payer: Medicare Other | Admitting: *Deleted

## 2016-01-02 DIAGNOSIS — I213 ST elevation (STEMI) myocardial infarction of unspecified site: Secondary | ICD-10-CM

## 2016-01-02 DIAGNOSIS — Z9861 Coronary angioplasty status: Secondary | ICD-10-CM | POA: Diagnosis not present

## 2016-01-02 DIAGNOSIS — I214 Non-ST elevation (NSTEMI) myocardial infarction: Secondary | ICD-10-CM | POA: Diagnosis not present

## 2016-01-02 NOTE — Progress Notes (Signed)
Daily Session Note  Patient Details  Name: Felicia Woods MRN: [id number] Date of Birth: 12/17/1955 Referring Provider:        Cardiac Rehab from 11/26/2015 in [facility] Cardiac and Pulmonary Rehab   Referring Provider  Berry, Jonathan MD      Encounter Date: 01/02/2016  Check In:     Session Check In - 01/02/16 0859    Check-In   Staff Present Susanne Bice, RN, BSN, CCRP;Rebecca Sickles, DPT, CEEA;Carroll Enterkin, RN, BSN   Supervising physician immediately available to respond to emergencies See telemetry face sheet for immediately available ER MD   Medication changes reported     Yes   Comments amilipoidine dose decreased by 1/2 a dose  10 mg down to 5 mg   Fall or balance concerns reported    No   Warm-up and Cool-down Performed on first and last piece of equipment   Resistance Training Performed Yes   VAD Patient? No   Pain Assessment   Currently in Pain? No/denies         Goals Met:  Independence with exercise equipment Exercise tolerated well No report of cardiac concerns or symptoms Strength training completed today  Goals Unmet:  Not Applicable  Comments: Doing well with exercise prescription progression.    Dr. Mark Miller is Medical Director for [facility] and [facility]. 

## 2016-01-05 ENCOUNTER — Encounter: Payer: Medicare Other | Attending: Cardiovascular Disease | Admitting: *Deleted

## 2016-01-05 DIAGNOSIS — Z9861 Coronary angioplasty status: Secondary | ICD-10-CM

## 2016-01-05 DIAGNOSIS — I213 ST elevation (STEMI) myocardial infarction of unspecified site: Secondary | ICD-10-CM

## 2016-01-05 DIAGNOSIS — I214 Non-ST elevation (NSTEMI) myocardial infarction: Secondary | ICD-10-CM | POA: Insufficient documentation

## 2016-01-05 NOTE — Progress Notes (Signed)
Incomplete Session Note  Patient Details  Name: Felicia Woods MRN: [id number] Date of Birth: 13-Dec-1955 Referring Provider:        Cardiac Rehab from 11/26/2015 in [facility] Cardiac and Pulmonary Rehab   Referring Provider  Quay Burow MD      [facility] did not complete her rehab session.  [name] came to class stating she has hurt/pulled/strained her knee.  She stated it started  hurting late on Friday and continues to have pain.  She was advised to call her MD to check her knee.  No exercise until her knee is better.

## 2016-01-07 ENCOUNTER — Encounter: Payer: Medicare Other | Admitting: *Deleted

## 2016-01-07 DIAGNOSIS — Z9861 Coronary angioplasty status: Secondary | ICD-10-CM | POA: Diagnosis not present

## 2016-01-07 DIAGNOSIS — I110 Hypertensive heart disease with heart failure: Secondary | ICD-10-CM | POA: Diagnosis not present

## 2016-01-07 DIAGNOSIS — I213 ST elevation (STEMI) myocardial infarction of unspecified site: Secondary | ICD-10-CM

## 2016-01-07 DIAGNOSIS — I214 Non-ST elevation (NSTEMI) myocardial infarction: Secondary | ICD-10-CM | POA: Diagnosis not present

## 2016-01-07 DIAGNOSIS — I2510 Atherosclerotic heart disease of native coronary artery without angina pectoris: Secondary | ICD-10-CM | POA: Diagnosis not present

## 2016-01-07 DIAGNOSIS — T82818D Embolism of vascular prosthetic devices, implants and grafts, subsequent encounter: Secondary | ICD-10-CM | POA: Diagnosis not present

## 2016-01-07 DIAGNOSIS — I2111 ST elevation (STEMI) myocardial infarction involving right coronary artery: Secondary | ICD-10-CM | POA: Diagnosis not present

## 2016-01-07 NOTE — Progress Notes (Signed)
Daily Session Note  Patient Details  Name: Macala Baldonado MRN: [id number] Date of Birth: 1956/04/30 Referring Provider:        Cardiac Rehab from 11/26/2015 in [facility] Cardiac and Pulmonary Rehab   Referring Provider  Quay Burow MD      Encounter Date: 01/07/2016  Check In:     Session Check In - 01/07/16 0940    Check-In   Location [facility]-Cardiac & Pulmonary Rehab   Staff Present Gerlene Burdock, RN, Vickki Hearing, BA, ACSM CEP, Exercise Physiologist;Alynah Schone Luan Pulling, MA, ACSM RCEP, Exercise Physiologist   Supervising physician immediately available to respond to emergencies See telemetry face sheet for immediately available ER MD   Medication changes reported     No   Fall or balance concerns reported    No   Warm-up and Cool-down Performed on first and last piece of equipment   Resistance Training Performed No   VAD Patient? No   Pain Assessment   Currently in Pain? No/denies   Multiple Pain Sites No         Goals Met:  Independence with exercise equipment Exercise tolerated well Personal goals reviewed No report of cardiac concerns or symptoms  Goals Unmet:  Not Applicable  Comments: Pt able to follow exercise prescription today without complaint.  Will continue to monitor for progression.  See ITP for goal review.   Dr. Emily Filbert is Medical Director for [place] and [facility].

## 2016-01-12 DIAGNOSIS — R42 Dizziness and giddiness: Secondary | ICD-10-CM | POA: Diagnosis not present

## 2016-01-14 DIAGNOSIS — Z9861 Coronary angioplasty status: Secondary | ICD-10-CM | POA: Diagnosis not present

## 2016-01-14 DIAGNOSIS — I213 ST elevation (STEMI) myocardial infarction of unspecified site: Secondary | ICD-10-CM

## 2016-01-14 DIAGNOSIS — I214 Non-ST elevation (NSTEMI) myocardial infarction: Secondary | ICD-10-CM | POA: Diagnosis not present

## 2016-01-14 NOTE — Progress Notes (Signed)
Daily Session Note  Patient Details  Name: Felicia Woods MRN: [id number] Date of Birth: [birth year] Referring Provider:        Cardiac Rehab from 11/26/2015 in [facility] Cardiac and Pulmonary Rehab   Referring Provider  Quay Burow MD      Encounter Date: 01/14/2016  Check In:     Session Check In - 01/14/16 0909    Check-In   Location [facility]-Cardiac & Pulmonary Rehab   Staff Present Alberteen Sam, MA, ACSM RCEP, Exercise Physiologist;Kateri Balch Oletta Darter, BA, ACSM CEP, Exercise Physiologist;Carroll Enterkin, RN, BSN   Supervising physician immediately available to respond to emergencies See telemetry face sheet for immediately available ER MD   Medication changes reported     No   Fall or balance concerns reported    No   Warm-up and Cool-down Performed on first and last piece of equipment   Resistance Training Performed Yes   VAD Patient? No   Pain Assessment   Currently in Pain? No/denies   Multiple Pain Sites No         Goals Met:  Independence with exercise equipment Exercise tolerated well No report of cardiac concerns or symptoms Strength training completed today  Goals Unmet:  Not Applicable  Comments: Pt able to follow exercise prescription today without complaint.  Will continue to monitor for progression.    Dr. Emily Filbert is Medical Director for [place] and [facility].

## 2016-01-16 ENCOUNTER — Encounter: Payer: Medicare Other | Admitting: *Deleted

## 2016-01-16 DIAGNOSIS — I213 ST elevation (STEMI) myocardial infarction of unspecified site: Secondary | ICD-10-CM

## 2016-01-16 NOTE — Progress Notes (Signed)
Daily Session Note  Patient Details  Name: Felicia Woods MRN: [id number] Date of Birth: Nov 20, 1955 Referring Provider:        Cardiac Rehab from 11/26/2015 in [facility] Cardiac and Pulmonary Rehab   Referring Provider  Quay Burow MD      Encounter Date: 01/16/2016  Check In:     Session Check In - 01/16/16 0942    Check-In   Location [facility]-Cardiac & Pulmonary Rehab   Staff Present Nyoka Cowden, RN, BSN, MA;Fate Galanti, RN, Levie Heritage, MA, ACSM RCEP, Exercise Physiologist   Supervising physician immediately available to respond to emergencies See telemetry face sheet for immediately available ER MD   Medication changes reported     No   Fall or balance concerns reported    No   Warm-up and Cool-down Performed on first and last piece of equipment   Resistance Training Performed Yes   VAD Patient? No   Pain Assessment   Currently in Pain? No/denies         Goals Met:  Proper associated with RPD/PD & O2 Sat Exercise tolerated well  Goals Unmet:  Not Applicable  Comments:     Dr. Emily Filbert is Medical Director for [place] and [facility].

## 2016-01-21 ENCOUNTER — Encounter: Payer: Self-pay | Admitting: *Deleted

## 2016-01-21 DIAGNOSIS — I213 ST elevation (STEMI) myocardial infarction of unspecified site: Secondary | ICD-10-CM

## 2016-01-21 DIAGNOSIS — Z9861 Coronary angioplasty status: Secondary | ICD-10-CM

## 2016-01-21 NOTE — Progress Notes (Signed)
Cardiac Individual Treatment Plan  Patient Details  Name: Felicia Woods MRN: [id number] Date of Birth: 1956-03-22 Referring Provider:        Cardiac Rehab from 11/26/2015 in [facility] Cardiac and Pulmonary Rehab   Referring Provider  Quay Burow MD      Initial Encounter Date:       Cardiac Rehab from 11/26/2015 in [facility] Cardiac and Pulmonary Rehab   Date  11/26/15   Referring Provider  Quay Burow MD      Visit Diagnosis: ST elevation myocardial infarction (STEMI), unspecified artery ([place])  S/P PTCA (percutaneous transluminal coronary angioplasty)  Patient's Home Medications on Admission:  Current outpatient prescriptions:  .  amLODipine (NORVASC) 10 MG tablet, Take 1 tablet (10 mg total) by mouth daily., Disp: 30 tablet, Rfl: 3 .  aspirin EC 81 MG tablet, Take 81 mg by mouth daily., Disp: , Rfl:  .  atorvastatin (LIPITOR) 80 MG tablet, Take 1 tablet (80 mg total) by mouth daily at 6 PM., Disp: 30 tablet, Rfl: 3 .  augmented betamethasone dipropionate (DIPROLENE-AF) 0.05 % cream, Apply 1 application topically daily. Reported on 11/18/2015, Disp: , Rfl:  .  carvedilol (COREG) 3.125 MG tablet, Take 1 tablet (3.125 mg total) by mouth 2 (two) times daily with a meal., Disp: 60 tablet, Rfl: 3 .  clopidogrel (PLAVIX) 75 MG tablet, Take 75 mg by mouth daily., Disp: , Rfl:  .  cyclobenzaprine (FLEXERIL) 10 MG tablet, Take 10 mg by mouth at bedtime. , Disp: , Rfl:  .  estradiol (ESTRACE) 0.5 MG tablet, Take 0.5 mg by mouth daily. , Disp: , Rfl:  .  furosemide (LASIX) 40 MG tablet, Take 40 mg by mouth daily. , Disp: , Rfl:  .  HYDROcodone-acetaminophen (NORCO/VICODIN) 5-325 MG tablet, Take 1 tablet by mouth every 8 (eight) hours as needed. For pain., Disp: , Rfl:  .  losartan (COZAAR) 50 MG tablet, Take 50 mg by mouth daily. , Disp: , Rfl:  .  meclizine (ANTIVERT) 25 MG tablet, Take 1 tablet (25 mg total) by mouth 3 (three) times daily as needed for dizziness. (Patient not taking: Reported  on 11/18/2015), Disp: 30 tablet, Rfl: 0 .  metFORMIN (GLUCOPHAGE) 500 MG tablet, Take 250 mg by mouth 2 (two) times daily with a meal. , Disp: , Rfl:  .  montelukast (SINGULAIR) 10 MG tablet, Take 10 mg by mouth daily. , Disp: , Rfl:  .  nitroGLYCERIN (NITROSTAT) 0.4 MG SL tablet, Place 0.4 mg under the tongue every 5 (five) minutes x 3 doses as needed for chest pain. *If no relief call MD or go to Emergency Room*, Disp: , Rfl:  .  raloxifene (EVISTA) 60 MG tablet, Take 60 mg by mouth daily. , Disp: , Rfl:  .  ranitidine (ZANTAC) 150 MG tablet, Take 150 mg by mouth daily. , Disp: , Rfl:  .  sertraline (ZOLOFT) 100 MG tablet, Take 100 mg by mouth daily. , Disp: , Rfl:  .  ticagrelor (BRILINTA) 90 MG TABS tablet, Take 1 tablet (90 mg total) by mouth 2 (two) times daily. (Patient not taking: Reported on 12/03/2015), Disp: 60 tablet, Rfl: 11 .  VENTOLIN HFA 108 (90 BASE) MCG/ACT inhaler, Inhale 2 puffs into the lungs every 6 (six) hours as needed for wheezing or shortness of breath. , Disp: , Rfl:  .  VESICARE 10 MG tablet, Take 10 mg by mouth daily. , Disp: , Rfl:  .  zolpidem (AMBIEN) 10 MG tablet, Take 10 mg by  mouth at bedtime., Disp: , Rfl:   Past Medical History: Past Medical History  Diagnosis Date  . Hypertension   . Diabetes mellitus without complication ([place])   . MI (myocardial infarction) ([place])   . Coronary artery disease     Tobacco Use: History  Smoking status  . Never Smoker   Smokeless tobacco  . Not on file    Labs: Recent Review Flowsheet Data    Labs for ITP Cardiac and Pulmonary Rehab Latest Ref Rng 10/18/2015 10/20/2015   Cholestrol 0 - 200 mg/dL - 153   LDLCALC 0 - 99 mg/dL - 97   HDL >40 mg/dL - 31(L)   Trlycerides <150 mg/dL - 123   TCO2 0 - 100 mmol/L 21 -       Exercise Target Goals:    Exercise Program Goal: Individual exercise prescription set with THRR, safety & activity barriers. Participant demonstrates ability to understand and report RPE using BORG  scale, to self-measure pulse accurately, and to acknowledge the importance of the exercise prescription.  Exercise Prescription Goal: Starting with aerobic activity 30 plus minutes a day, 3 days per week for initial exercise prescription. Provide home exercise prescription and guidelines that participant acknowledges understanding prior to discharge.  Activity Barriers & Risk Stratification:     Activity Barriers & Cardiac Risk Stratification - 11/18/15 1213    Activity Barriers & Cardiac Risk Stratification   Activity Barriers Shortness of Breath;Chest Pain/Angina  Squeezing in the chest and SOB with exertion, resting resovles symptoms.  Can occur 2-3 times a day.  MD is aware.   Cardiac Risk Stratification High      6 Minute Walk:     6 Minute Walk      11/18/15 1458 11/18/15 1503     6 Minute Walk   Phase Initial     Distance 770 feet     Walk Time 4 minutes     # of Rest Breaks 1     RPE 13     Symptoms  Yes (comment)    Comments  Pt stopped at 4 minutes of walk, stating "chest tightness" which resolved with rest.     Resting HR 94 bpm     Resting BP 126/70 mmHg     Max Ex. HR 122 bpm     Max Ex. BP 134/72 mmHg        Initial Exercise Prescription:     Initial Exercise Prescription - 11/26/15 0900    Date of Initial Exercise RX and Referring Provider   Date 11/26/15   Referring Provider Quay Burow MD   Treadmill   MPH 1.5   Grade 0   Minutes 15   Cybex   Level 10   RPM 25   Minutes 5   T5 Nustep   Level 3   Watts 26   Minutes 15   Intensity   THRR REST +  30   Ratings of Perceived Exertion 11-15   Perceived Dyspnea 0-4   Progression   Progression Continue to progress workloads to maintain intensity without signs/symptoms of physical distress.   Resistance Training   Training Prescription Yes   Weight 2   Reps 10-15      Perform Capillary Blood Glucose checks as needed.  Exercise Prescription Changes:     Exercise Prescription Changes       11/18/15 1300 12/03/15 1100 12/17/15 1400 12/31/15 1300 01/15/16 0800   Exercise Review   Progression  Yes Yes  Yes  Response to Exercise   Blood Pressure (Admit) 126/60 mmHg 112/74 mmHg 142/80 mmHg 118/64 mmHg 120/62 mmHg   Blood Pressure (Exercise) 134/72 mmHg 128/60 mmHg 158/88 mmHg 122/64 mmHg 122/60 mmHg   Blood Pressure (Exit) 132/82 mmHg 102/68 mmHg 124/72 mmHg 110/62 mmHg 128/64 mmHg   Heart Rate (Admit) 94 bpm 73 bpm 89 bpm 87 bpm 62 bpm   Heart Rate (Exercise) 122 bpm 109 bpm 156 bpm 89 bpm 146 bpm   Heart Rate (Exit) 85 bpm 91 bpm 102 bpm 89 bpm 99 bpm   Rating of Perceived Exertion (Exercise)  _0 Symptoms  none none  none   Duration  Progress to 30 minutes of continuous aerobic without signs/symptoms of physical distress Progress to 45 minutes of aerobic exercise without signs/symptoms of physical distress Progress to 45 minutes of aerobic exercise without signs/symptoms of physical distress Progress to 45 minutes of aerobic exercise without signs/symptoms of physical distress   Intensity  THRR New  117-147 THRR unchanged THRR unchanged THRR unchanged   Progression   Progression  Continue to progress workloads to maintain intensity without signs/symptoms of physical distress. Continue to progress workloads to maintain intensity without signs/symptoms of physical distress. Continue to progress workloads to maintain intensity without signs/symptoms of physical distress. Continue to progress workloads to maintain intensity without signs/symptoms of physical distress.   Average METs  2.43 3.3  3.8   Resistance Training   Training Prescription  Yes Yes Yes Yes   Weight  2 2  --  Pt arrives late and misses weights regularly   Reps  10-15 10-15     Interval Training   Interval Training  No No  No   Treadmill   MPH  2.3 2.6  2.6   Grade  0 2.5  1.5   Minutes  _1 NuStep   Level   _2 Watts   60 31 --  4.3   Minutes   15  15   Cybex   Level  10       RPM  25      Minutes  5      REL-XR   Level   3  4   Watts   83     Minutes   15  15   T5 Nustep   Level  4      Watts  20      Minutes  15         Exercise Comments:     Exercise Comments      11/18/15 1502 12/03/15 1105 12/08/15 0906 12/17/15 1408 12/19/15 0859   Exercise Comments Patient limited in 6 minute walk by "chest tightness" which resolved with rest Pt is doing well after second full day of exercise.  She has not had any recurring symptoms noted.  She is making progress in her workloads.  We will work on continuing to increase treadmill and trying other pieces of equipment. Reviewed new workloads with pt.  She was ameanable to changes.  We will add incline to treadmill at next visit as she wanted to walk faster today.  She tolerated changes well without signs or symptoms. Felicia Woods is doing well with exercise.  She is making good improvements and is up to 20 min now on treadmill without signs or symptoms!  Will continue to monitor for progress. Felicia Woods got dizzy on the treadmill so stopped and sat down. Blood  pressure was 102/70 so given 240 cc of water then it was 102/60. Blood sugar was 158. After 2nd cup of water 240 cc blood pressure was up to 114/70. Felicia Woods said she is just very tired since "how do you get any sleep with 4 children sleeping in a bed with me. My son is working 2 jobs so I have them for the summer although they do get to go to day care so Felicia Woods said she was going back home to go to sleep.      01/15/16 0839           Exercise Comments Felicia Woods is doing well with her exercise.  She has made some progress when she is here.  She is walking at home some on the days that she is not here.          Discharge Exercise Prescription (Final Exercise Prescription Changes):     Exercise Prescription Changes - 01/15/16 0800    Exercise Review   Progression Yes   Response to Exercise   Blood Pressure (Admit) 120/62 mmHg   Blood Pressure (Exercise) 122/60 mmHg   Blood  Pressure (Exit) 128/64 mmHg   Heart Rate (Admit) 62 bpm   Heart Rate (Exercise) 146 bpm   Heart Rate (Exit) 99 bpm   Rating of Perceived Exertion (Exercise) 12   Symptoms none   Duration Progress to 45 minutes of aerobic exercise without signs/symptoms of physical distress   Intensity THRR unchanged   Progression   Progression Continue to progress workloads to maintain intensity without signs/symptoms of physical distress.   Average METs 3.8   Resistance Training   Training Prescription Yes   Weight --  Pt arrives late and misses weights regularly   Interval Training   Interval Training No   Treadmill   MPH 2.6   Grade 1.5   Minutes 15   NuStep   Level 4   Watts --  4.3   Minutes 15   REL-XR   Level 4   Minutes 15      Nutrition:  Target Goals: Understanding of nutrition guidelines, daily intake of sodium <1516m, cholesterol <2096m calories 30% from fat and 7% or less from saturated fats, daily to have 5 or more servings of fruits and vegetables.  Biometrics:     Pre Biometrics - 11/18/15 1457    Pre Biometrics   Height 5' 6.25" (1.683 m)   Weight 253 lb (114.76 kg)   Waist Circumference 49 inches   Hip Circumference 55 inches   Waist to Hip Ratio 0.89 %   BMI (Calculated) 40.6       Nutrition Therapy Plan and Nutrition Goals:     Nutrition Therapy & Goals - 12/12/15 1110    Nutrition Therapy   Diet Instructed on a meal plan based on dietary guidelines for diabetes and DASH diet principles based on 1600 calories.   Drug/Food Interactions Statins/Certain Fruits   Protein (specify units) 6   Fiber 20 grams   Whole Grain Foods 3 servings   Saturated Fats 11 max. grams   Fruits and Vegetables 5 servings/day   Sodium 1500 grams   Personal Nutrition Goals   Personal Goal #1 Balance meals with protein, 1-2 starch servings, non-starchy vegetables. Can add a small serving of fruit- Refer to handout.   Personal Goal #2 Increase "free/non-starchy" vegetables.    Personal Goal #3 Read labels for saturated fat and trans fat and sodium.   Intervention Plan   Intervention Prescribe,  educate and counsel regarding individualized specific dietary modifications aiming towards targeted core components such as weight, hypertension, lipid management, diabetes, heart failure and other comorbidities.;Nutrition handout(s) given to patient.   Expected Outcomes Short Term Goal: Understand basic principles of dietary content, such as calories, fat, sodium, cholesterol and nutrients.;Long Term Goal: Adherence to prescribed nutrition plan.;Short Term Goal: A plan has been developed with personal nutrition goals set during dietitian appointment.      Nutrition Discharge: Rate Your Plate Scores:     Nutrition Assessments - 12/12/15 1114    Rate Your Plate Scores   Pre Score 52   Pre Score % 58 %      Nutrition Goals Re-Evaluation:   Psychosocial: Target Goals: Acknowledge presence or absence of depression, maximize coping skills, provide positive support system. Participant is able to verbalize types and ability to use techniques and skills needed for reducing stress and depression.  Initial Review & Psychosocial Screening:     Initial Psych Review & Screening - 11/18/15 1230    Initial Review   Current issues with Current Sleep Concerns  Takes sleeping pills and they do not help.MD is aware as of today.Marland Kitchen Apt stairs and symptoms, MD has written letter to help Felicia Woods move to first level apartment   [place]? Yes  Husband and 2 sons and a daughter   Barriers   Psychosocial barriers to participate in program There are no identifiable barriers or psychosocial needs.   Screening Interventions   Interventions Encouraged to exercise      Quality of Life Scores:     Quality of Life - 11/18/15 1651    Quality of Life Scores   Health/Function Pre 17.3 %   Socioeconomic Pre 15.58 %   Psych/Spiritual Pre 25.71 %   Family Pre  25.2 %   GLOBAL Pre 19.97 %      PHQ-9:     Recent Review Flowsheet Data    Depression screen [facility] 2/9 11/18/2015 02/10/2015   Decreased Interest 1 0   Down, Depressed, Hopeless 2 0    PHQ - 2 Score 3 0   Altered sleeping 3 -   Tired, decreased energy 3 -   Change in appetite 2 -   Feeling bad or failure about yourself  0 -   Trouble concentrating 0 -   Moving slowly or fidgety/restless 0 -   Suicidal thoughts 0 -   PHQ-9 Score 11 -   Difficult doing work/chores Somewhat difficult -      Psychosocial Evaluation and Intervention:     Psychosocial Evaluation - 11/26/15 0937    Psychosocial Evaluation & Interventions   Interventions Stress management education;Relaxation education;Encouraged to exercise with the program and follow exercise prescription   Comments Counselor met with Ms. Moretto today for initial psychosocial evaluation.  She is a 60 year old who had a heart attack and stent inserted on 4/15.  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  She has a strong support system with a spouse, adult children who live close by and active participation in her church community.  Felicia Woods reports not sleeping well but was just prescribed Ambien for this and has not yet begun taking it.  She denies a history of depression or anxiety or current symptoms.  She states her mood is typically positive even though she has multiple health issues and her father, who lives in the mountains of [place], has bone cancer,  which is stressful for Felicia Woods at times.  She has goals to improve her health overall, to be educated on healthier eating/nutrition, and to lose some weight while in this program.  She plans to look into a follow up program here at [facility] upon completion of this program.     Continued Psychosocial Services Needed Yes  Felicia Woods will benefit from psychoeducation on stress and relaxation while in this program.       Psychosocial Re-Evaluation:      Psychosocial Re-Evaluation      12/19/15 0835 01/14/16 0859 01/14/16 0903 01/14/16 0909 01/16/16 0943   Psychosocial Re-Evaluation   Interventions Encouraged to attend Cardiac Rehabilitation for the exercise       Comments Felicia Woods arrives later since she drops off her grandchildren at day care. Felicia Woods is "unable to attend the 4pm Cardiac Rehab class since she is watching her grandchildren since her husband works." Felicia Woods has been able to attend all 3 days this week of Felicia Woods.  Counselor follow up with Felicia Woods today, reporting she has experienced more energy since coming into this program and exercising more consistently.  However, she still struggles with the stairs in her apartment and is continuing to request a lower level unit as a result.  Felicia Woods asked counselor about the "Nutrisystem" program for a dietary option and counselor encouraged her to speak with the dietician.  Counselor questioned Ms. Deviney about her Counselor follow up with Ms. Goodnough today, reporting she has experienced more energy since coming into this program and exercising more consistently.  However, she still struggles with the stairs in her apartment and is continuing to request a lower level unit as a result.  Ms. Holck asked counselor about the "Nutrisystem" program for a dietary option and counselor encouraged her to speak with the dietician.  Counselor questioned Ms. Peragine about her medications since she denied a history of depression or anxiety but is on 146m of Sertraline.  Ms. WQuallsstated she "just takes what the Drs give me" and thought it was part of her heart medicine regimen.  Counselor encouraged Ms. Haack to speak with her Dr. about this at her next appointment.  She also reports ongoing sleep difficulties and counselor mentioned speaking to her Dr. or pharmacist about a natural OTC sleep aid to help with this.  Counselor will continue to follow with Ms. Seldon throughout the course of this  program.   Counselor follow up with Ms. Delgrande today, reporting she has experienced more energy since coming into this program and exercising more consistently.  However, she still struggles with the stairs in her apartment and is continuing to request a lower level unit as a result.  Ms. WMartoranoasked counselor about the "Nutrisystem" program for a dietary option and counselor encouraged her to speak with the dietician.  Counselor questioned Ms. Grimes about her [place] her husband to work and [place] still watching her 4 grandchildren while her son works 2 jobs.       Vocational Rehabilitation: Provide vocational rehab assistance to qualifying candidates.   Vocational Rehab Evaluation & Intervention:     Vocational Rehab - 11/18/15 1220    Initial Vocational Rehab Evaluation & Intervention   Assessment shows need for Vocational Rehabilitation No      Education: Education Goals: Education classes will be provided on a weekly basis, covering required topics. Participant will state understanding/return demonstration of topics presented.  Learning Barriers/Preferences:  Learning Barriers/Preferences - 11/18/15 1219    Learning Barriers/Preferences   Learning Barriers None   Learning Preferences Individual Instruction;Verbal Instruction      Education Topics: General Nutrition Guidelines/Fats and Fiber: -Group instruction provided by verbal, written material, models and posters to present the general guidelines for heart healthy nutrition. Gives an explanation and review of dietary fats and fiber.          Cardiac Rehab from 12/15/2015 in [facility] Cardiac and Pulmonary Rehab   Date  12/08/15   Educator  Jaclyn Shaggy   Instruction Review Code  2- meets goals/outcomes      Controlling Sodium/Reading Food Labels: -Group verbal and written material supporting the discussion of sodium use in heart healthy nutrition. Review and explanation  with models, verbal and written materials for utilization of the food label.      Cardiac Rehab from 12/15/2015 in [facility] Cardiac and Pulmonary Rehab   Date  12/15/15   Educator  CR   Instruction Review Code  2- meets goals/outcomes      Exercise Physiology & Risk Factors: - Group verbal and written instruction with models to review the exercise physiology of the cardiovascular system and associated critical values. Details cardiovascular disease risk factors and the goals associated with each risk factor.   Aerobic Exercise & Resistance Training: - Gives group verbal and written discussion on the health impact of inactivity. On the components of aerobic and resistive training programs and the benefits of this training and how to safely progress through these programs.   Flexibility, Balance, General Exercise Guidelines: - Provides group verbal and written instruction on the benefits of flexibility and balance training programs. Provides general exercise guidelines with specific guidelines to those with heart or lung disease. Demonstration and skill practice provided.   Stress Management: - Provides group verbal and written instruction about the health risks of elevated stress, cause of high stress, and healthy ways to reduce stress.   Depression: - Provides group verbal and written instruction on the correlation between heart/lung disease and depressed mood, treatment options, and the stigmas associated with seeking treatment.      Cardiac Rehab from 12/15/2015 in [facility] Cardiac and Pulmonary Rehab   Date  12/03/15   Educator  Kathreen Cornfield, [facility]   Instruction Review Code  2- meets goals/outcomes      Anatomy & Physiology of the Heart: - Group verbal and written instruction and models provide basic cardiac anatomy and physiology, with the coronary electrical and arterial systems. Review of: AMI, Angina, Valve disease, Heart Failure, Cardiac Arrhythmia, Pacemakers, and the ICD.   Cardiac  Procedures: - Group verbal and written instruction and models to describe the testing methods done to diagnose heart disease. Reviews the outcomes of the test results. Describes the treatment choices: Medical Management, Angioplasty, or Coronary Bypass Surgery.   Cardiac Medications: - Group verbal and written instruction to review commonly prescribed medications for heart disease. Reviews the medication, class of the drug, and side effects. Includes the steps to properly store meds and maintain the prescription regimen.   Go Sex-Intimacy & Heart Disease, Get SMART - Goal Setting: - Group verbal and written instruction through game format to discuss heart disease and the return to sexual intimacy. Provides group verbal and written material to discuss and apply goal setting through the application of the S.M.A.R.T. Method.   Other Matters of the Heart: - Provides group verbal, written materials and models to describe Heart Failure, Angina, Valve Disease, and Diabetes in the realm  of heart disease. Includes description of the disease process and treatment options available to the cardiac patient.   Exercise & Equipment Safety: - Individual verbal instruction and demonstration of equipment use and safety with use of the equipment.      Cardiac Rehab from 12/15/2015 in [facility] Cardiac and Pulmonary Rehab   Date  11/18/15   Educator  SB   Instruction Review Code  2- meets goals/outcomes      Infection Prevention: - Provides verbal and written material to individual with discussion of infection control including proper hand washing and proper equipment cleaning during exercise session.      Cardiac Rehab from 12/15/2015 in [facility] Cardiac and Pulmonary Rehab   Date  11/18/15   Educator  SB   Instruction Review Code  2- meets goals/outcomes      Falls Prevention: - Provides verbal and written material to individual with discussion of falls prevention and safety.      Cardiac Rehab from 12/15/2015  in [facility] Cardiac and Pulmonary Rehab   Date  11/18/15   Educator  SB   Instruction Review Code  2- meets goals/outcomes      Diabetes: - Individual verbal and written instruction to review signs/symptoms of diabetes, desired ranges of glucose level fasting, after meals and with exercise. Advice that pre and post exercise glucose checks will be done for 3 sessions at entry of program.      Cardiac Rehab from 12/15/2015 in [facility] Cardiac and Pulmonary Rehab   Date  11/18/15   Educator  SB   Instruction Review Code  2- meets goals/outcomes       Knowledge Questionnaire Score:     Knowledge Questionnaire Score - 11/18/15 1651    Knowledge Questionnaire Score   Pre Score 14/28      Core Components/Risk Factors/Patient Goals at Admission:     Personal Goals and Risk Factors at Admission - 11/18/15 1220    Core Components/Risk Factors/Patient Goals on Admission    Weight Management Yes;Obesity   Intervention Weight Management: Develop a combined nutrition and exercise program designed to reach desired caloric intake, while maintaining appropriate intake of nutrient and fiber, sodium and fats, and appropriate energy expenditure required for the weight goal.;Weight Management: Provide education and appropriate resources to help participant work on and attain dietary goals.;Weight Management/Obesity: Establish reasonable short term and long term weight goals.;Obesity: Provide education and appropriate resources to help participant work on and attain dietary goals.   Admit Weight 253 lb (114.76 kg)   Goal Weight: Short Term 245 lb (111.131 kg)   Goal Weight: Long Term 180 lb (81.647 kg)   Expected Outcomes Short Term: Continue to assess and modify interventions until short term weight is achieved;Long Term: Adherence to nutrition and physical activity/exercise program aimed toward attainment of established weight goal   Sedentary Yes   Intervention Provide advice, education, support and  counseling about physical activity/exercise needs.;Develop an individualized exercise prescription for aerobic and resistive training based on initial evaluation findings, risk stratification, comorbidities and participant's personal goals.   Expected Outcomes Achievement of increased cardiorespiratory fitness and enhanced flexibility, muscular endurance and strength shown through measurements of functional capacity and personal statement of participant.   Diabetes Yes   Intervention Provide education about signs/symptoms and action to take for hypo/hyperglycemia.;Provide education about proper nutrition, including hydration, and aerobic/resistive exercise prescription along with prescribed medications to achieve blood glucose in normal ranges: Fasting glucose 65-99 mg/dL   Expected Outcomes Short Term: Participant verbalizes understanding  of the signs/symptoms and immediate care of hyper/hypoglycemia, proper foot care and importance of medication, aerobic/resistive exercise and nutrition plan for blood glucose control.;Long Term: Attainment of HbA1C < 7%.   Hypertension Yes   Intervention Provide education on lifestyle modifcations including regular physical activity/exercise, weight management, moderate sodium restriction and increased consumption of fresh fruit, vegetables, and low fat dairy, alcohol moderation, and smoking cessation.;Monitor prescription use compliance.   Expected Outcomes Short Term: Continued assessment and intervention until BP is < 140/22m HG in hypertensive participants. < 130/871mHG in hypertensive participants with diabetes, heart failure or chronic kidney disease.;Long Term: Maintenance of blood pressure at goal levels.   Lipids Yes   Intervention Provide education and support for participant on nutrition & aerobic/resistive exercise along with prescribed medications to achieve LDL <70104mHDL >32m63m Expected Outcomes Short Term: Participant states understanding of desired  cholesterol values and is compliant with medications prescribed. Participant is following exercise prescription and nutrition guidelines.;Long Term: Cholesterol controlled with medications as prescribed, with individualized exercise RX and with personalized nutrition plan. Value goals: LDL < 70mg44mL > 40 mg.   Stress Yes  Lives on second floor with steps only to get into apartment.  Symptoms occur everytime when walking up steps.  HAs had MD write note for her to move to first floor apartment and has taken letter to landlord.    Intervention Offer individual and/or small group education and counseling on adjustment to heart disease, stress management and health-related lifestyle change. Teach and support self-help strategies.   Expected Outcomes Short Term: Participant demonstrates changes in health-related behavior, relaxation and other stress management skills, ability to obtain effective social support, and compliance with psychotropic medications if prescribed.;Long Term: Emotional wellbeing is indicated by absence of clinically significant psychosocial distress or social isolation.      Core Components/Risk Factors/Patient Goals Review:      Goals and Risk Factor Review      12/19/15 0907 12/22/15 1349 01/07/16 0940       Core Components/Risk Factors/Patient Goals Review   Personal Goals Review Weight Management/Obesity;Sedentary;Hypertension;Lipids Sedentary;Weight Management/Obesity;Increase Strength and Stamina;Hypertension;Lipids;Diabetes;Stress Weight Management/Obesity;Increase Strength and Stamina;Diabetes;Hypertension;Lipids;Stress;Sedentary     Review Tashawna got dizzy on the treadmill so stopped and sat down. Blood pressure was 102/70 so given 240 cc of water then it was 102/60. Blood sugar was 158. After 2nd cup of water 240 cc blood pressure was up to 114/70. Felicia Woods said she is just very tired since "how do you get any sleep with 4 children sleeping in a bed with me. My son is  working 2 jobs so I have them for the summer although they do get to go to day care so Felicia Woods said she was going back home to go to sleep. Felicia Woods Lameroing well with exercise.  She comes to class consistently.  Her weights have been averaging between 244-248.  Her blood pressure are in the 120s/60s for resting.  No problems with her lipid medication.  She is not letting stress get the better of her any longer.  She is walk for an hour everyday to and from the mailbox.  He blood sugars have also been going down overall. Felicia Woods has been doing well in program.  He weight has been steady around 247-248.  She has been doing some walking at home on days she is not coming to program.  Her blood sugars have a steady pattern and she was encouraged to keep a jouranal about it to discuss with  her doctor.  They have been high in the morning (200s) and low after dinner (60s).  She is going to also try adding in more protein to her bed time snack.  Her blood pressure and statin meds are doing well.  Her stress levels are up now that she is moving from the third floor down to the first floor but this should improve after the move.     Expected Outcomes Cont to exercise 3 times a week.  Felicia Woods will continue to make improvements across the board.  We will continue to monitor for progress. Felicia Woods will continue to exercise in program and work on weight loss.  We will continue to check on her home blood sugars as well.  Once she is done moving, her overall stress level will come down and she should be able to exercise at home more since she will no longer have to navigate the stairs.        Core Components/Risk Factors/Patient Goals at Discharge (Final Review):      Goals and Risk Factor Review - 01/07/16 0940    Core Components/Risk Factors/Patient Goals Review   Personal Goals Review Weight Management/Obesity;Increase Strength and Stamina;Diabetes;Hypertension;Lipids;Stress;Sedentary   Review Felicia Woods has been doing well in  program.  He weight has been steady around 247-248.  She has been doing some walking at home on days she is not coming to program.  Her blood sugars have a steady pattern and she was encouraged to keep a jouranal about it to discuss with her doctor.  They have been high in the morning (200s) and low after dinner (60s).  She is going to also try adding in more protein to her bed time snack.  Her blood pressure and statin meds are doing well.  Her stress levels are up now that she is moving from the third floor down to the first floor but this should improve after the move.   Expected Outcomes Felicia Woods will continue to exercise in program and work on weight loss.  We will continue to check on her home blood sugars as well.  Once she is done moving, her overall stress level will come down and she should be able to exercise at home more since she will no longer have to navigate the stairs.      ITP Comments:     ITP Comments      11/18/15 1648 11/23/15 1308 12/19/15 0834 12/19/15 0911 12/22/15 1352   ITP Comments INitial medical evaluation completed   LAG:[id number] with ITP To start sesions Monday May 22  30 day review. Continue with ITP. Orientation completed to start sessions on May 22 Jeniyah arrives later since she drops off her grandchildren at day care. Averee is "unable to attend the 4pm Cardiac Rehab class since she is watching her grandchildren since her husband works." Bert has been able to attend all 3 days this week of [place].  Chailyn got dizzy on the treadmill so stopped and sat down. Blood pressure was 102/70 so given 240 cc of water then it was 102/60. Blood sugar was 158. After 2nd cup of water 240 cc blood pressure was up to 114/70. Lissandra said she is just very tired since "how do you get any sleep with 4 children sleeping in a bed with me. My son is working 2 jobs so I have them for the summer although they do get to go to day care so Malary said she was going back home to go to sleep.  Since Sachiko arrives late to class, she has not been able to attend all of the education classes.     12/24/15 0730 12/29/15 0833         ITP Comments 30 day review.  Continue with ITP  Continues to arive daily for her exerxie session.  Has 4 grandchildren at home wiht her and she takes all to camp before arriving here for her session. Dashea came in today and did not feel well. Her primary symptom was dizziness. She woke up with a fasting blood glucose of 46 mg/dL. She did eat breakfast before coming to class.  Her blood glucose was checked upon arrival and was 152 mg/dL. Her blood pressure was 118/64. She had a scheduled doctors appointment with Dr. Nehemiah Massed this morning.  She was advised not to exercise and was taken to her doctors office ( on campus) by a Lobbyist.         Comments:

## 2016-01-23 ENCOUNTER — Encounter: Payer: Medicare Other | Admitting: *Deleted

## 2016-01-23 DIAGNOSIS — Z9861 Coronary angioplasty status: Secondary | ICD-10-CM | POA: Diagnosis not present

## 2016-01-23 DIAGNOSIS — I213 ST elevation (STEMI) myocardial infarction of unspecified site: Secondary | ICD-10-CM

## 2016-01-23 DIAGNOSIS — I214 Non-ST elevation (NSTEMI) myocardial infarction: Secondary | ICD-10-CM | POA: Diagnosis not present

## 2016-01-23 NOTE — Progress Notes (Signed)
Daily Session Note  Patient Details  Name: Felicia Woods MRN: [id number] Date of Birth: September 06, 1955 Referring Provider:        Cardiac Rehab from 11/26/2015 in [facility] Cardiac and Pulmonary Rehab   Referring Provider  Quay Burow MD      Encounter Date: 01/23/2016  Check In:     Session Check In - 01/23/16 0904    Check-In   Location [facility]-Cardiac & Pulmonary Rehab   Staff Present Alberteen Sam, MA, ACSM RCEP, Exercise Physiologist;Amanda Oletta Darter, BA, ACSM CEP, Exercise Physiologist;Carroll Enterkin, RN, BSN   Supervising physician immediately available to respond to emergencies See telemetry face sheet for immediately available ER MD   Medication changes reported     Yes   Comments Added Prilosec   Fall or balance concerns reported    No   Warm-up and Cool-down Performed on first and last piece of equipment   Resistance Training Performed Yes   Pain Assessment   Currently in Pain? No/denies   Multiple Pain Sites No         Goals Met:  Independence with exercise equipment Exercise tolerated well No report of cardiac concerns or symptoms Strength training completed today  Goals Unmet:  Not Applicable  Comments: Pt able to follow exercise prescription today without complaint.  Will continue to monitor for progression.    Dr. Emily Filbert is Medical Director for [place] and [facility].

## 2016-01-26 ENCOUNTER — Encounter: Payer: Medicare Other | Admitting: *Deleted

## 2016-01-26 DIAGNOSIS — I213 ST elevation (STEMI) myocardial infarction of unspecified site: Secondary | ICD-10-CM

## 2016-01-26 DIAGNOSIS — Z9861 Coronary angioplasty status: Secondary | ICD-10-CM

## 2016-01-26 DIAGNOSIS — I214 Non-ST elevation (NSTEMI) myocardial infarction: Secondary | ICD-10-CM | POA: Diagnosis not present

## 2016-01-28 NOTE — Progress Notes (Signed)
Daily Session Note  Patient Details  Name: Felicia Woods MRN: [id number] Date of Birth: [birth year] Referring Provider:   Flowsheet Row Cardiac Rehab from 11/26/2015 in [facility] Cardiac and Pulmonary Rehab  Referring Provider  Quay Burow MD      Encounter Date: 01/26/2016  Check In:     Session Check In - 01/28/16 1125      Check-In   Location [facility]-Cardiac & Pulmonary Rehab   Staff Present Alberteen Sam, MA, ACSM RCEP, Exercise Physiologist;Susanne Bice, RN, BSN, CCRP;Carroll Enterkin, RN, BSN   Supervising physician immediately available to respond to emergencies See telemetry face sheet for immediately available ER MD   Medication changes reported     No   Fall or balance concerns reported    No   Warm-up and Cool-down Performed on first and last piece of equipment   Resistance Training Performed No   VAD Patient? No     Pain Assessment   Currently in Pain? No/denies   Multiple Pain Sites No         Goals Met:  Independence with exercise equipment Exercise tolerated well No report of cardiac concerns or symptoms  Goals Unmet:  Not Applicable  Comments: Pt able to follow exercise prescription today without complaint.  Will continue to monitor for progression.    Dr. Emily Filbert is Medical Director for [place] and [facility].

## 2016-01-30 ENCOUNTER — Encounter: Payer: Medicare Other | Admitting: *Deleted

## 2016-01-30 DIAGNOSIS — Z9861 Coronary angioplasty status: Secondary | ICD-10-CM | POA: Diagnosis not present

## 2016-01-30 DIAGNOSIS — I213 ST elevation (STEMI) myocardial infarction of unspecified site: Secondary | ICD-10-CM

## 2016-01-30 DIAGNOSIS — I214 Non-ST elevation (NSTEMI) myocardial infarction: Secondary | ICD-10-CM | POA: Diagnosis not present

## 2016-01-30 NOTE — Progress Notes (Signed)
Daily Session Note  Patient Details  Name: Felicia Woods MRN: [id number] Date of Birth: 02/20/1956 Referring Provider:   Flowsheet Row Cardiac Rehab from 11/26/2015 in [facility] Cardiac and Pulmonary Rehab  Referring Provider  Quay Burow MD      Encounter Date: 01/30/2016  Check In:     Session Check In - 01/30/16 1036      Check-In   Location [facility]-Cardiac & Pulmonary Rehab   Staff Present Nyoka Cowden, RN, BSN, MA;Carroll Enterkin, RN, Levie Heritage, MA, ACSM RCEP, Exercise Physiologist   Supervising physician immediately available to respond to emergencies See telemetry face sheet for immediately available ER MD   Medication changes reported     No   Fall or balance concerns reported    No   Warm-up and Cool-down Performed on first and last piece of equipment   Resistance Training Performed No  Pt arrives late for class   VAD Patient? No     Pain Assessment   Currently in Pain? No/denies   Multiple Pain Sites No         Goals Met:  Independence with exercise equipment Exercise tolerated well No report of cardiac concerns or symptoms  Goals Unmet:  Not Applicable  Comments: Pt able to follow exercise prescription today without complaint.  Will continue to monitor for progression.    Dr. Emily Filbert is Medical Director for [place] and [facility].

## 2016-02-02 DIAGNOSIS — M25561 Pain in right knee: Secondary | ICD-10-CM | POA: Diagnosis not present

## 2016-02-04 ENCOUNTER — Encounter: Payer: Medicare Other | Attending: Cardiovascular Disease

## 2016-02-04 DIAGNOSIS — Z9861 Coronary angioplasty status: Secondary | ICD-10-CM | POA: Insufficient documentation

## 2016-02-04 DIAGNOSIS — M1712 Unilateral primary osteoarthritis, left knee: Secondary | ICD-10-CM | POA: Diagnosis not present

## 2016-02-04 DIAGNOSIS — I214 Non-ST elevation (NSTEMI) myocardial infarction: Secondary | ICD-10-CM | POA: Insufficient documentation

## 2016-02-04 DIAGNOSIS — E119 Type 2 diabetes mellitus without complications: Secondary | ICD-10-CM | POA: Diagnosis not present

## 2016-02-06 ENCOUNTER — Encounter: Payer: Medicare Other | Admitting: *Deleted

## 2016-02-06 DIAGNOSIS — Z9861 Coronary angioplasty status: Secondary | ICD-10-CM

## 2016-02-06 DIAGNOSIS — I213 ST elevation (STEMI) myocardial infarction of unspecified site: Secondary | ICD-10-CM

## 2016-02-06 DIAGNOSIS — I214 Non-ST elevation (NSTEMI) myocardial infarction: Secondary | ICD-10-CM | POA: Diagnosis not present

## 2016-02-06 NOTE — Progress Notes (Signed)
Daily Session Note  Patient Details  Name: Felicia Woods MRN: [id number] Date of Birth: 03-22-1956 Referring Provider:   Flowsheet Row Cardiac Rehab from 11/26/2015 in [facility] Cardiac and Pulmonary Rehab  Referring Provider  Quay Burow MD      Encounter Date: 02/06/2016  Check In:     Session Check In - 02/06/16 0908      Check-In   Location [facility]-Cardiac & Pulmonary Rehab   Staff Present Gerlene Burdock, RN, BSN;Susanne Bice, RN, BSN, CCRP;Jessica Luan Pulling, MA, ACSM RCEP, Exercise Physiologist   Supervising physician immediately available to respond to emergencies See telemetry face sheet for immediately available ER MD   Medication changes reported     No   Warm-up and Cool-down Performed on first and last piece of equipment   Resistance Training Performed Yes   VAD Patient? No     Pain Assessment   Currently in Pain? No/denies         Goals Met:  Exercise tolerated well No report of cardiac concerns or symptoms Strength training completed today  Goals Unmet:  Not Applicable  Comments: Doing well with exercise prescription progression.    Dr. Emily Filbert is Medical Director for [place] and [facility].

## 2016-02-09 ENCOUNTER — Encounter: Payer: Medicare Other | Admitting: *Deleted

## 2016-02-09 DIAGNOSIS — M25561 Pain in right knee: Secondary | ICD-10-CM | POA: Diagnosis not present

## 2016-02-09 DIAGNOSIS — Z9861 Coronary angioplasty status: Secondary | ICD-10-CM | POA: Diagnosis not present

## 2016-02-09 DIAGNOSIS — I10 Essential (primary) hypertension: Secondary | ICD-10-CM | POA: Diagnosis not present

## 2016-02-09 DIAGNOSIS — I213 ST elevation (STEMI) myocardial infarction of unspecified site: Secondary | ICD-10-CM

## 2016-02-09 DIAGNOSIS — M545 Low back pain: Secondary | ICD-10-CM | POA: Diagnosis not present

## 2016-02-09 DIAGNOSIS — G4700 Insomnia, unspecified: Secondary | ICD-10-CM | POA: Diagnosis not present

## 2016-02-09 DIAGNOSIS — I214 Non-ST elevation (NSTEMI) myocardial infarction: Secondary | ICD-10-CM | POA: Diagnosis not present

## 2016-02-09 NOTE — Progress Notes (Signed)
Daily Session Note  Patient Details  Name: Felicia Woods MRN: [id number] Date of Birth: 02-08-1956 Referring Provider:   Flowsheet Row Cardiac Rehab from 11/26/2015 in [facility] Cardiac and Pulmonary Rehab  Referring Provider  Quay Burow MD      Encounter Date: 02/09/2016  Check In:     Session Check In - 02/09/16 0913      Check-In   Location [facility]-Cardiac & Pulmonary Rehab   Staff Present Alberteen Sam, MA, ACSM RCEP, Exercise Physiologist;Kelly Amedeo Plenty, BS, ACSM CEP, Exercise Physiologist;Susanne Bice, RN, BSN, CCRP   Supervising physician immediately available to respond to emergencies See telemetry face sheet for immediately available ER MD   Medication changes reported     Yes   Comments Added Meloxican 15 mg daily   Fall or balance concerns reported    No   Warm-up and Cool-down Performed on first and last piece of equipment   Resistance Training Performed No   VAD Patient? No     Pain Assessment   Currently in Pain? No/denies   Multiple Pain Sites No         Goals Met:  Independence with exercise equipment Exercise tolerated well No report of cardiac concerns or symptoms  Goals Unmet:  Not Applicable  Comments: Pt able to follow exercise prescription today without complaint.  Will continue to monitor for progression.    Dr. Emily Filbert is Medical Director for [place] and [facility].

## 2016-02-10 DIAGNOSIS — Z6841 Body Mass Index (BMI) 40.0 and over, adult: Secondary | ICD-10-CM | POA: Diagnosis not present

## 2016-02-10 DIAGNOSIS — I5032 Chronic diastolic (congestive) heart failure: Secondary | ICD-10-CM | POA: Diagnosis not present

## 2016-02-10 DIAGNOSIS — I2119 ST elevation (STEMI) myocardial infarction involving other coronary artery of inferior wall: Secondary | ICD-10-CM | POA: Diagnosis not present

## 2016-02-10 DIAGNOSIS — I25810 Atherosclerosis of coronary artery bypass graft(s) without angina pectoris: Secondary | ICD-10-CM | POA: Diagnosis not present

## 2016-02-10 DIAGNOSIS — I10 Essential (primary) hypertension: Secondary | ICD-10-CM | POA: Diagnosis not present

## 2016-02-10 DIAGNOSIS — E782 Mixed hyperlipidemia: Secondary | ICD-10-CM | POA: Diagnosis not present

## 2016-02-10 DIAGNOSIS — E119 Type 2 diabetes mellitus without complications: Secondary | ICD-10-CM | POA: Diagnosis not present

## 2016-02-10 DIAGNOSIS — I952 Hypotension due to drugs: Secondary | ICD-10-CM | POA: Diagnosis not present

## 2016-02-13 ENCOUNTER — Encounter: Payer: Medicare Other | Admitting: *Deleted

## 2016-02-13 DIAGNOSIS — Z9861 Coronary angioplasty status: Secondary | ICD-10-CM | POA: Diagnosis not present

## 2016-02-13 DIAGNOSIS — I214 Non-ST elevation (NSTEMI) myocardial infarction: Secondary | ICD-10-CM | POA: Diagnosis not present

## 2016-02-13 LAB — GLUCOSE, CAPILLARY: Glucose-Capillary: 176 mg/dL — ABNORMAL HIGH (ref 65–99)

## 2016-02-13 NOTE — Progress Notes (Signed)
Daily Session Note  Patient Details  Name: Felicia Woods MRN: [id number] Date of Birth: Nov 13, 1955 Referring Provider:   Flowsheet Row Cardiac Rehab from 11/26/2015 in [facility] Cardiac and Pulmonary Rehab  Referring Provider  Quay Burow MD      Encounter Date: 02/13/2016  Check In:     Session Check In - 02/13/16 0844      Check-In   Location [facility]-Cardiac & Pulmonary Rehab   Staff Present Heath Lark, RN, BSN, CCRP;Kaelob Persky, RN, Levie Heritage, MA, ACSM RCEP, Exercise Physiologist   Medication changes reported     No   Fall or balance concerns reported    No   Warm-up and Cool-down Performed on first and last piece of equipment   Resistance Training Performed No   VAD Patient? No     Pain Assessment   Currently in Pain? No/denies           Exercise Prescription Changes - 02/12/16 1100      Exercise Review   Progression Yes     Response to Exercise   Blood Pressure (Admit) 126/70   Blood Pressure (Exercise) 136/70   Blood Pressure (Exit) 126/64   Heart Rate (Admit) 91 bpm   Heart Rate (Exercise) 133 bpm   Heart Rate (Exit) 79 bpm   Rating of Perceived Exertion (Exercise) 13   Symptoms knee pain   Duration Progress to 30 minutes of continuous aerobic without signs/symptoms of physical distress   Intensity THRR unchanged     Progression   Progression Continue to progress workloads to maintain intensity without signs/symptoms of physical distress.   Average METs 2.53  knee pain     Resistance Training   Training Prescription Yes   Weight --  Pt arrives late and misses weights regularly     Interval Training   Interval Training No     Treadmill   MPH 2.6   Grade 1.5   Minutes 15     NuStep   Level 4   Watts --  1.7 METs   Minutes 15      Goals Met:  Proper associated with RPD/PD & O2 Sat Exercise tolerated well  Goals Unmet:  Not Applicable  Comments:    Dr. Emily Filbert is Medical Director for [place] and [facility].

## 2016-02-16 ENCOUNTER — Encounter: Payer: Medicare Other | Admitting: *Deleted

## 2016-02-16 DIAGNOSIS — Z9861 Coronary angioplasty status: Secondary | ICD-10-CM

## 2016-02-16 DIAGNOSIS — I213 ST elevation (STEMI) myocardial infarction of unspecified site: Secondary | ICD-10-CM

## 2016-02-16 DIAGNOSIS — I214 Non-ST elevation (NSTEMI) myocardial infarction: Secondary | ICD-10-CM | POA: Diagnosis not present

## 2016-02-16 NOTE — Progress Notes (Signed)
Daily Session Note  Patient Details  Name: Felicia Woods MRN: [id number] Date of Birth: [birth year] Referring Provider:   Flowsheet Row Cardiac Rehab from 11/26/2015 in [facility] Cardiac and Pulmonary Rehab  Referring Provider  Quay Burow MD      Encounter Date: 02/16/2016  Check In:     Session Check In - 02/16/16 0817      Check-In   Location [facility]-Cardiac & Pulmonary Rehab   Staff Present Alberteen Sam, MA, ACSM RCEP, Exercise Physiologist;Kelly Amedeo Plenty, BS, ACSM CEP, Exercise Physiologist;Carroll Enterkin, RN, BSN   Supervising physician immediately available to respond to emergencies See telemetry face sheet for immediately available ER MD   Medication changes reported     No   Fall or balance concerns reported    No   Warm-up and Cool-down Performed on first and last piece of equipment   Resistance Training Performed Yes   VAD Patient? No     Pain Assessment   Currently in Pain? No/denies   Multiple Pain Sites No         Goals Met:  Independence with exercise equipment Exercise tolerated well No report of cardiac concerns or symptoms Strength training completed today  Goals Unmet:  Not Applicable  Comments: Pt able to follow exercise prescription today without complaint.  Will continue to monitor for progression.    Dr. Emily Filbert is Medical Director for [place] and [facility].

## 2016-02-18 ENCOUNTER — Encounter: Payer: Self-pay | Admitting: *Deleted

## 2016-02-18 DIAGNOSIS — Z9861 Coronary angioplasty status: Secondary | ICD-10-CM

## 2016-02-18 DIAGNOSIS — I213 ST elevation (STEMI) myocardial infarction of unspecified site: Secondary | ICD-10-CM

## 2016-02-18 NOTE — Progress Notes (Signed)
Cardiac Individual Treatment Plan  Patient Details  Name: Felicia Woods MRN: [id number] Date of Birth: 1956/05/22 Referring Provider:   Flowsheet Row Cardiac Rehab from 11/26/2015 in [facility] Cardiac and Pulmonary Rehab  Referring Provider  Quay Burow MD      Initial Encounter Date:  Flowsheet Row Cardiac Rehab from 11/26/2015 in [facility] Cardiac and Pulmonary Rehab  Date  11/26/15  Referring Provider  Quay Burow MD      Visit Diagnosis: ST elevation myocardial infarction (STEMI), unspecified artery ([place])  S/P PTCA (percutaneous transluminal coronary angioplasty)  Patient's Home Medications on Admission:  Current Outpatient Prescriptions:  .  amLODipine (NORVASC) 10 MG tablet, Take 1 tablet (10 mg total) by mouth daily., Disp: 30 tablet, Rfl: 3 .  aspirin EC 81 MG tablet, Take 81 mg by mouth daily., Disp: , Rfl:  .  atorvastatin (LIPITOR) 80 MG tablet, Take 1 tablet (80 mg total) by mouth daily at 6 PM., Disp: 30 tablet, Rfl: 3 .  augmented betamethasone dipropionate (DIPROLENE-AF) 0.05 % cream, Apply 1 application topically daily. Reported on 11/18/2015, Disp: , Rfl:  .  carvedilol (COREG) 3.125 MG tablet, Take 1 tablet (3.125 mg total) by mouth 2 (two) times daily with a meal., Disp: 60 tablet, Rfl: 3 .  clopidogrel (PLAVIX) 75 MG tablet, Take 75 mg by mouth daily., Disp: , Rfl:  .  cyclobenzaprine (FLEXERIL) 10 MG tablet, Take 10 mg by mouth at bedtime. , Disp: , Rfl:  .  estradiol (ESTRACE) 0.5 MG tablet, Take 0.5 mg by mouth daily. , Disp: , Rfl:  .  furosemide (LASIX) 40 MG tablet, Take 40 mg by mouth daily. , Disp: , Rfl:  .  HYDROcodone-acetaminophen (NORCO/VICODIN) 5-325 MG tablet, Take 1 tablet by mouth every 8 (eight) hours as needed. For pain., Disp: , Rfl:  .  losartan (COZAAR) 50 MG tablet, Take 50 mg by mouth daily. , Disp: , Rfl:  .  meclizine (ANTIVERT) 25 MG tablet, Take 1 tablet (25 mg total) by mouth 3 (three) times daily as needed for dizziness. (Patient not  taking: Reported on 11/18/2015), Disp: 30 tablet, Rfl: 0 .  metFORMIN (GLUCOPHAGE) 500 MG tablet, Take 250 mg by mouth 2 (two) times daily with a meal. , Disp: , Rfl:  .  montelukast (SINGULAIR) 10 MG tablet, Take 10 mg by mouth daily. , Disp: , Rfl:  .  nitroGLYCERIN (NITROSTAT) 0.4 MG SL tablet, Place 0.4 mg under the tongue every 5 (five) minutes x 3 doses as needed for chest pain. *If no relief call MD or go to Emergency Room*, Disp: , Rfl:  .  raloxifene (EVISTA) 60 MG tablet, Take 60 mg by mouth daily. , Disp: , Rfl:  .  ranitidine (ZANTAC) 150 MG tablet, Take 150 mg by mouth daily. , Disp: , Rfl:  .  sertraline (ZOLOFT) 100 MG tablet, Take 100 mg by mouth daily. , Disp: , Rfl:  .  ticagrelor (BRILINTA) 90 MG TABS tablet, Take 1 tablet (90 mg total) by mouth 2 (two) times daily. (Patient not taking: Reported on 12/03/2015), Disp: 60 tablet, Rfl: 11 .  VENTOLIN HFA 108 (90 BASE) MCG/ACT inhaler, Inhale 2 puffs into the lungs every 6 (six) hours as needed for wheezing or shortness of breath. , Disp: , Rfl:  .  VESICARE 10 MG tablet, Take 10 mg by mouth daily. , Disp: , Rfl:  .  zolpidem (AMBIEN) 10 MG tablet, Take 10 mg by mouth at bedtime., Disp: , Rfl:   Past  Medical History: Past Medical History:  Diagnosis Date  . Coronary artery disease   . Diabetes mellitus without complication ([place])   . Hypertension   . MI (myocardial infarction) ([place])     Tobacco Use: History  Smoking Status  . Never Smoker  Smokeless Tobacco  . Not on file    Labs: Recent Review Flowsheet Data    Labs for ITP Cardiac and Pulmonary Rehab Latest Ref Rng & Units 10/18/2015 10/20/2015   Cholestrol 0 - 200 mg/dL - 153   LDLCALC 0 - 99 mg/dL - 97   HDL >40 mg/dL - 31(L)   Trlycerides <150 mg/dL - 123   TCO2 0 - 100 mmol/L 21 -       Exercise Target Goals:    Exercise Program Goal: Individual exercise prescription set with THRR, safety & activity barriers. Participant demonstrates ability to understand  and report RPE using BORG scale, to self-measure pulse accurately, and to acknowledge the importance of the exercise prescription.  Exercise Prescription Goal: Starting with aerobic activity 30 plus minutes a day, 3 days per week for initial exercise prescription. Provide home exercise prescription and guidelines that participant acknowledges understanding prior to discharge.  Activity Barriers & Risk Stratification:     Activity Barriers & Cardiac Risk Stratification - 11/18/15 1213      Activity Barriers & Cardiac Risk Stratification   Activity Barriers Shortness of Breath;Chest Pain/Angina  Squeezing in the chest and SOB with exertion, resting resovles symptoms.  Can occur 2-3 times a day.  MD is aware.   Cardiac Risk Stratification High      6 Minute Walk:     6 Minute Walk    Row Name 11/18/15 1458 11/18/15 1503       6 Minute Walk   Phase Initial  -    Distance 770 feet  -    Walk Time 4 minutes  -    # of Rest Breaks 1  -    RPE 13  -    Symptoms  - Yes (comment)    Comments  - Pt stopped at 4 minutes of walk, stating "chest tightness" which resolved with rest.     Resting HR 94 bpm  -    Resting BP 126/70  -    Max Ex. HR 122 bpm  -    Max Ex. BP 134/72  -       Initial Exercise Prescription:     Initial Exercise Prescription - 11/26/15 0900      Date of Initial Exercise RX and Referring Provider   Date 11/26/15   Referring Provider Quay Burow MD     Treadmill   MPH 1.5   Grade 0   Minutes 15     Cybex   Level 10   RPM 25   Minutes 5     T5 Nustep   Level 3   Watts 26   Minutes 15     Intensity   THRR REST +  30   Ratings of Perceived Exertion 11-15   Perceived Dyspnea 0-4     Progression   Progression Continue to progress workloads to maintain intensity without signs/symptoms of physical distress.     Resistance Training   Training Prescription Yes   Weight 2   Reps 10-15      Perform Capillary Blood Glucose checks as  needed.  Exercise Prescription Changes:     Exercise Prescription Changes    Row Name 11/18/15 1300 12/03/15 1100  12/17/15 1400 12/31/15 1300 01/15/16 0800     Exercise Review   Progression  - Yes Yes  - Yes     Response to Exercise   Blood Pressure (Admit) 126/60 112/74 142/80 118/64 120/62   Blood Pressure (Exercise) 134/72 128/60 158/88 122/64 122/60   Blood Pressure (Exit) 132/82 102/68 124/72 110/62 128/64   Heart Rate (Admit) 94 bpm 73 bpm 89 bpm 87 bpm 62 bpm   Heart Rate (Exercise) 122 bpm 109 bpm 156 bpm 89 bpm 146 bpm   Heart Rate (Exit) 85 bpm 91 bpm 102 bpm 89 bpm 99 bpm   Rating of Perceived Exertion (Exercise)  - '13 14 13 12   '$ Symptoms  - none none  - none   Duration  - Progress to 30 minutes of continuous aerobic without signs/symptoms of physical distress Progress to 45 minutes of aerobic exercise without signs/symptoms of physical distress Progress to 45 minutes of aerobic exercise without signs/symptoms of physical distress Progress to 45 minutes of aerobic exercise without signs/symptoms of physical distress   Intensity  - THRR New  117-147 THRR unchanged THRR unchanged THRR unchanged     Progression   Progression  - Continue to progress workloads to maintain intensity without signs/symptoms of physical distress. Continue to progress workloads to maintain intensity without signs/symptoms of physical distress. Continue to progress workloads to maintain intensity without signs/symptoms of physical distress. Continue to progress workloads to maintain intensity without signs/symptoms of physical distress.   Average METs  - 2.43 3.3  - 3.8     Resistance Training   Training Prescription  - Yes Yes Yes Yes   Weight  - 2 2  - -  Pt arrives late and misses weights regularly   Reps  - 10-15 10-15  -  -     Interval Training   Interval Training  - No No  - No     Treadmill   MPH  - 2.3 2.6  - 2.6   Grade  - 0 2.5  - 1.5   Minutes  - 15 20  - 15     NuStep   Level   -  - '4 5 4   '$ Watts  -  - 60 31 -  4.3   Minutes  -  - 15  - 15     Cybex   Level  - 10  -  -  -   RPM  - 25  -  -  -   Minutes  - 5  -  -  -     REL-XR   Level  -  - 3  - 4   Watts  -  - 83  -  -   Minutes  -  - 15  - 15     T5 Nustep   Level  - 4  -  -  -   Watts  - 20  -  -  -   Minutes  - 15  -  -  -   Row Name 01/28/16 1100 02/12/16 1100           Exercise Review   Progression Yes Yes        Response to Exercise   Blood Pressure (Admit) 110/70 126/70      Blood Pressure (Exercise) 116/70 136/70      Blood Pressure (Exit) 124/74 126/64      Heart Rate (Admit) 86 bpm 91 bpm  Heart Rate (Exercise) 122 bpm 133 bpm      Heart Rate (Exit) 86 bpm 79 bpm      Rating of Perceived Exertion (Exercise) 13 13      Symptoms none knee pain      Duration Progress to 30 minutes of continuous aerobic without signs/symptoms of physical distress Progress to 30 minutes of continuous aerobic without signs/symptoms of physical distress      Intensity THRR unchanged THRR unchanged        Progression   Progression Continue to progress workloads to maintain intensity without signs/symptoms of physical distress. Continue to progress workloads to maintain intensity without signs/symptoms of physical distress.      Average METs 4.25 2.53  knee pain        Resistance Training   Training Prescription Yes Yes      Weight -  Pt arrives late and misses weights regularly -  Pt arrives late and misses weights regularly        Interval Training   Interval Training No No        Treadmill   MPH 2.6 2.6      Grade 1.5 1.5      Minutes 15 15        NuStep   Level 4 4      Watts -  3.4 -  1.7 METs      Minutes 15 15        Elliptical   Level 1  -      Speed 2.5  -      Minutes 15  -      METs 6  -         Exercise Comments:     Exercise Comments    Row Name 11/18/15 1502 12/03/15 1105 12/08/15 0906 12/17/15 1408 12/19/15 0859   Exercise Comments Patient limited in 6 minute  walk by "chest tightness" which resolved with rest Pt is doing well after second full day of exercise.  She has not had any recurring symptoms noted.  She is making progress in her workloads.  We will work on continuing to increase treadmill and trying other pieces of equipment. Reviewed new workloads with pt.  She was ameanable to changes.  We will add incline to treadmill at next visit as she wanted to walk faster today.  She tolerated changes well without signs or symptoms. Otis is doing well with exercise.  She is making good improvements and is up to 20 min now on treadmill without signs or symptoms!  Will continue to monitor for progress. Dariela got dizzy on the treadmill so stopped and sat down. Blood pressure was 102/70 so given 240 cc of water then it was 102/60. Blood sugar was 158. After 2nd cup of water 240 cc blood pressure was up to 114/70. Shaili said she is just very tired since "how do you get any sleep with 4 children sleeping in a bed with me. My son is working 2 jobs so I have them for the summer although they do get to go to day care so Erynne said she was going back home to go to sleep.    [place] Name 01/15/16 6629 01/28/16 1127 02/12/16 1134 02/13/16 1005     Exercise Comments Amye is doing well with her exercise.  She has made some progress when she is here.  She is walking at home some on the days that she is not here. Khady continues to do well  with exercise.  She is getting her full 30 min each day.  We will continue to monitor for progress. Ravan has been doing well with exercise.  She has started to wear a knee brace as it has been bothering her at home.  She did see the doctor and was given some pain medication for it.  We will continue to monitor. Vicki's blood pressure dropped some after exercise. She was given water and felt better after it began to recover.       Discharge Exercise Prescription (Final Exercise Prescription Changes):     Exercise Prescription Changes -  02/12/16 1100      Exercise Review   Progression Yes     Response to Exercise   Blood Pressure (Admit) 126/70   Blood Pressure (Exercise) 136/70   Blood Pressure (Exit) 126/64   Heart Rate (Admit) 91 bpm   Heart Rate (Exercise) 133 bpm   Heart Rate (Exit) 79 bpm   Rating of Perceived Exertion (Exercise) 13   Symptoms knee pain   Duration Progress to 30 minutes of continuous aerobic without signs/symptoms of physical distress   Intensity THRR unchanged     Progression   Progression Continue to progress workloads to maintain intensity without signs/symptoms of physical distress.   Average METs 2.53  knee pain     Resistance Training   Training Prescription Yes   Weight --  Pt arrives late and misses weights regularly     Interval Training   Interval Training No     Treadmill   MPH 2.6   Grade 1.5   Minutes 15     NuStep   Level 4   Watts --  1.7 METs   Minutes 15      Nutrition:  Target Goals: Understanding of nutrition guidelines, daily intake of sodium '1500mg'$ , cholesterol '200mg'$ , calories 30% from fat and 7% or less from saturated fats, daily to have 5 or more servings of fruits and vegetables.  Biometrics:     Pre Biometrics - 11/18/15 1457      Pre Biometrics   Height 5' 6.25" (1.683 m)   Weight 253 lb (114.8 kg)   Waist Circumference 49 inches   Hip Circumference 55 inches   Waist to Hip Ratio 0.89 %   BMI (Calculated) 40.6       Nutrition Therapy Plan and Nutrition Goals:     Nutrition Therapy & Goals - 12/12/15 1110      Nutrition Therapy   Diet Instructed on a meal plan based on dietary guidelines for diabetes and DASH diet principles based on 1600 calories.   Drug/Food Interactions Statins/Certain Fruits   Protein (specify units) 6   Fiber 20 grams   Whole Grain Foods 3 servings   Saturated Fats 11 max. grams   Fruits and Vegetables 5 servings/day   Sodium 1500 grams     Personal Nutrition Goals   Personal Goal #1 Balance meals  with protein, 1-2 starch servings, non-starchy vegetables. Can add a small serving of fruit- Refer to handout.   Personal Goal #2 Increase "free/non-starchy" vegetables.   Personal Goal #3 Read labels for saturated fat and trans fat and sodium.     Intervention Plan   Intervention Prescribe, educate and counsel regarding individualized specific dietary modifications aiming towards targeted core components such as weight, hypertension, lipid management, diabetes, heart failure and other comorbidities.;Nutrition handout(s) given to patient.   Expected Outcomes Short Term Goal: Understand basic principles of dietary content, such as calories,  fat, sodium, cholesterol and nutrients.;Long Term Goal: Adherence to prescribed nutrition plan.;Short Term Goal: A plan has been developed with personal nutrition goals set during dietitian appointment.      Nutrition Discharge: Rate Your Plate Scores:     Nutrition Assessments - 12/12/15 1114      Rate Your Plate Scores   Pre Score 52   Pre Score % 58 %      Nutrition Goals Re-Evaluation:   Psychosocial: Target Goals: Acknowledge presence or absence of depression, maximize coping skills, provide positive support system. Participant is able to verbalize types and ability to use techniques and skills needed for reducing stress and depression.  Initial Review & Psychosocial Screening:     Initial Psych Review & Screening - 11/18/15 1230      Initial Review   Current issues with Current Sleep Concerns  Takes sleeping pills and they do not help.MD is aware as of today.Marland Kitchen Apt stairs and symptoms, MD has written letter to help Rhegan move to first level apartment     [place]? Yes  Husband and 2 sons and a daughter     Barriers   Psychosocial barriers to participate in program There are no identifiable barriers or psychosocial needs.     Screening Interventions   Interventions Encouraged to exercise      Quality  of Life Scores:     Quality of Life - 11/18/15 1651      Quality of Life Scores   Health/Function Pre 17.3 %   Socioeconomic Pre 15.58 %   Psych/Spiritual Pre 25.71 %   Family Pre 25.2 %   GLOBAL Pre 19.97 %      PHQ-9: Recent Review Flowsheet Data    Depression screen [facility] 2/9 11/18/2015 02/10/2015   Decreased Interest 1 0   Down, Depressed, Hopeless 2 0    PHQ - 2 Score 3 0   Altered sleeping 3 -   Tired, decreased energy 3 -   Change in appetite 2 -   Feeling bad or failure about yourself  0 -   Trouble concentrating 0 -   Moving slowly or fidgety/restless 0 -   Suicidal thoughts 0 -   PHQ-9 Score 11 -   Difficult doing work/chores Somewhat difficult -      Psychosocial Evaluation and Intervention:     Psychosocial Evaluation - 11/26/15 0937      Psychosocial Evaluation & Interventions   Interventions Stress management education;Relaxation education;Encouraged to exercise with the program and follow exercise prescription   Comments Counselor met with Ms. Quiles today for initial psychosocial evaluation.  She is a 59 year old who had a heart attack and stent inserted on 4/15.  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  She has a strong support system with a spouse, adult children who live close by and active participation in her church community.  Ms. Cocco reports not sleeping well but was just prescribed Ambien for this and has not yet begun taking it.  She denies a history of depression or anxiety or current symptoms.  She states her mood is typically positive even though she has multiple health issues and her father, who lives in the mountains of [place], has bone cancer, which is stressful for Ms. Darin at times.  She has goals to improve her health overall, to be educated on healthier eating/nutrition, and to lose some weight while in this program.  She plans  to look into a follow up program here at [facility] upon completion of this program.      Continued Psychosocial Services Needed Yes  Ms. Salas will benefit from psychoeducation on stress and relaxation while in this program.       Psychosocial Re-Evaluation:     Psychosocial Re-Evaluation    Row Name 12/19/15 [phone] 01/14/16 0859 01/14/16 0903 01/14/16 0909 01/16/16 0943     Psychosocial Re-Evaluation   Interventions Encouraged to attend Cardiac Rehabilitation for the exercise  -  -  -  -   Comments Ravyn arrives later since she drops off her grandchildren at day care. Aira is "unable to attend the 4pm Cardiac Rehab class since she is watching her grandchildren since her husband works." Deette has been able to attend all 3 days this week of [place].  Counselor follow up with Ms. Bullis today, reporting she has experienced more energy since coming into this program and exercising more consistently.  However, she still struggles with the stairs in her apartment and is continuing to request a lower level unit as a result.  Ms. Voisin asked counselor about the "Nutrisystem" program for a dietary option and counselor encouraged her to speak with the dietician.  Counselor questioned Ms. Ayre about her Counselor follow up with Ms. Merkley today, reporting she has experienced more energy since coming into this program and exercising more consistently.  However, she still struggles with the stairs in her apartment and is continuing to request a lower level unit as a result.  Ms. Orren asked counselor about the "Nutrisystem" program for a dietary option and counselor encouraged her to speak with the dietician.  Counselor questioned Ms. Aeschliman about her medications since she denied a history of depression or anxiety but is on '100mg'$  of Sertraline.  Ms. Howerton stated she "just takes what the Drs give me" and thought it was part of her heart medicine regimen.  Counselor encouraged Ms. Albee to speak with her Dr. about this at her next appointment.  She also reports ongoing sleep  difficulties and counselor mentioned speaking to her Dr. or pharmacist about a natural OTC sleep aid to help with this.  Counselor will continue to follow with Ms. Shain throughout the course of this program.   Counselor follow up with Ms. Simington today, reporting she has experienced more energy since coming into this program and exercising more consistently.  However, she still struggles with the stairs in her apartment and is continuing to request a lower level unit as a result.  Ms. Pounds asked counselor about the "Nutrisystem" program for a dietary option and counselor encouraged her to speak with the dietician.  Counselor questioned Ms. Kitagawa about her Evoleth takes her husband to work and then comes to exercise in [place]. Celsa is still watching her 4 grandchildren while her son works 2 jobs.       Vocational Rehabilitation: Provide vocational rehab assistance to qualifying candidates.   Vocational Rehab Evaluation & Intervention:     Vocational Rehab - 11/18/15 1220      Initial Vocational Rehab Evaluation & Intervention   Assessment shows need for Vocational Rehabilitation No      Education: Education Goals: Education classes will be provided on a weekly basis, covering required topics. Participant will state understanding/return demonstration of topics presented.  Learning Barriers/Preferences:     Learning Barriers/Preferences - 11/18/15 1219      Learning Barriers/Preferences   Learning Barriers None   Learning Preferences Individual Instruction;Verbal  Instruction      Education Topics: General Nutrition Guidelines/Fats and Fiber: -Group instruction provided by verbal, written material, models and posters to present the general guidelines for heart healthy nutrition. Gives an explanation and review of dietary fats and fiber. Flowsheet Row Cardiac Rehab from 02/09/2016 in [facility] Cardiac and Pulmonary Rehab  Date  12/08/15  Educator  Jill Side  Instruction Review  Code  2- meets goals/outcomes      Controlling Sodium/Reading Food Labels: -Group verbal and written material supporting the discussion of sodium use in heart healthy nutrition. Review and explanation with models, verbal and written materials for utilization of the food label. Flowsheet Row Cardiac Rehab from 02/09/2016 in [facility] Cardiac and Pulmonary Rehab  Date  02/09/16  Educator  CR  Instruction Review Code  R- Review/reinforce [Second Class]      Exercise Physiology & Risk Factors: - Group verbal and written instruction with models to review the exercise physiology of the cardiovascular system and associated critical values. Details cardiovascular disease risk factors and the goals associated with each risk factor.   Aerobic Exercise & Resistance Training: - Gives group verbal and written discussion on the health impact of inactivity. On the components of aerobic and resistive training programs and the benefits of this training and how to safely progress through these programs.   Flexibility, Balance, General Exercise Guidelines: - Provides group verbal and written instruction on the benefits of flexibility and balance training programs. Provides general exercise guidelines with specific guidelines to those with heart or lung disease. Demonstration and skill practice provided.   Stress Management: - Provides group verbal and written instruction about the health risks of elevated stress, cause of high stress, and healthy ways to reduce stress.   Depression: - Provides group verbal and written instruction on the correlation between heart/lung disease and depressed mood, treatment options, and the stigmas associated with seeking treatment. Flowsheet Row Cardiac Rehab from 02/09/2016 in [facility] Cardiac and Pulmonary Rehab  Date  12/03/15  Educator  Belva Crome, [facility]  Instruction Review Code  2- meets goals/outcomes      Anatomy & Physiology of the Heart: - Group verbal and written  instruction and models provide basic cardiac anatomy and physiology, with the coronary electrical and arterial systems. Review of: AMI, Angina, Valve disease, Heart Failure, Cardiac Arrhythmia, Pacemakers, and the ICD.   Cardiac Procedures: - Group verbal and written instruction and models to describe the testing methods done to diagnose heart disease. Reviews the outcomes of the test results. Describes the treatment choices: Medical Management, Angioplasty, or Coronary Bypass Surgery.   Cardiac Medications: - Group verbal and written instruction to review commonly prescribed medications for heart disease. Reviews the medication, class of the drug, and side effects. Includes the steps to properly store meds and maintain the prescription regimen.   Go Sex-Intimacy & Heart Disease, Get SMART - Goal Setting: - Group verbal and written instruction through game format to discuss heart disease and the return to sexual intimacy. Provides group verbal and written material to discuss and apply goal setting through the application of the S.M.A.R.T. Method.   Other Matters of the Heart: - Provides group verbal, written materials and models to describe Heart Failure, Angina, Valve Disease, and Diabetes in the realm of heart disease. Includes description of the disease process and treatment options available to the cardiac patient.   Exercise & Equipment Safety: - Individual verbal instruction and demonstration of equipment use and safety with use of the equipment. Flowsheet Row Cardiac  Rehab from 02/09/2016 in [facility] Cardiac and Pulmonary Rehab  Date  11/18/15  Educator  SB  Instruction Review Code  2- meets goals/outcomes      Infection Prevention: - Provides verbal and written material to individual with discussion of infection control including proper hand washing and proper equipment cleaning during exercise session. Flowsheet Row Cardiac Rehab from 02/09/2016 in [facility] Cardiac and Pulmonary Rehab   Date  11/18/15  Educator  SB  Instruction Review Code  2- meets goals/outcomes      Falls Prevention: - Provides verbal and written material to individual with discussion of falls prevention and safety. Flowsheet Row Cardiac Rehab from 02/09/2016 in [facility] Cardiac and Pulmonary Rehab  Date  11/18/15  Educator  SB  Instruction Review Code  2- meets goals/outcomes      Diabetes: - Individual verbal and written instruction to review signs/symptoms of diabetes, desired ranges of glucose level fasting, after meals and with exercise. Advice that pre and post exercise glucose checks will be done for 3 sessions at entry of program. Flowsheet Row Cardiac Rehab from 02/09/2016 in [facility] Cardiac and Pulmonary Rehab  Date  11/18/15  Educator  SB  Instruction Review Code  2- meets goals/outcomes       Knowledge Questionnaire Score:     Knowledge Questionnaire Score - 11/18/15 1651      Knowledge Questionnaire Score   Pre Score 14/28      Core Components/Risk Factors/Patient Goals at Admission:     Personal Goals and Risk Factors at Admission - 11/18/15 1220      Core Components/Risk Factors/Patient Goals on Admission    Weight Management Yes;Obesity   Intervention Weight Management: Develop a combined nutrition and exercise program designed to reach desired caloric intake, while maintaining appropriate intake of nutrient and fiber, sodium and fats, and appropriate energy expenditure required for the weight goal.;Weight Management: Provide education and appropriate resources to help participant work on and attain dietary goals.;Weight Management/Obesity: Establish reasonable short term and long term weight goals.;Obesity: Provide education and appropriate resources to help participant work on and attain dietary goals.   Admit Weight 253 lb (114.8 kg)   Goal Weight: Short Term 245 lb (111.1 kg)   Goal Weight: Long Term 180 lb (81.6 kg)   Expected Outcomes Short Term: Continue to assess and  modify interventions until short term weight is achieved;Long Term: Adherence to nutrition and physical activity/exercise program aimed toward attainment of established weight goal   Sedentary Yes   Intervention Provide advice, education, support and counseling about physical activity/exercise needs.;Develop an individualized exercise prescription for aerobic and resistive training based on initial evaluation findings, risk stratification, comorbidities and participant's personal goals.   Expected Outcomes Achievement of increased cardiorespiratory fitness and enhanced flexibility, muscular endurance and strength shown through measurements of functional capacity and personal statement of participant.   Diabetes Yes   Intervention Provide education about signs/symptoms and action to take for hypo/hyperglycemia.;Provide education about proper nutrition, including hydration, and aerobic/resistive exercise prescription along with prescribed medications to achieve blood glucose in normal ranges: Fasting glucose 65-99 mg/dL   Expected Outcomes Short Term: Participant verbalizes understanding of the signs/symptoms and immediate care of hyper/hypoglycemia, proper foot care and importance of medication, aerobic/resistive exercise and nutrition plan for blood glucose control.;Long Term: Attainment of HbA1C < 7%.   Hypertension Yes   Intervention Provide education on lifestyle modifcations including regular physical activity/exercise, weight management, moderate sodium restriction and increased consumption of fresh fruit, vegetables, and low fat dairy,  alcohol moderation, and smoking cessation.;Monitor prescription use compliance.   Expected Outcomes Short Term: Continued assessment and intervention until BP is < 140/87m HG in hypertensive participants. < 130/826mHG in hypertensive participants with diabetes, heart failure or chronic kidney disease.;Long Term: Maintenance of blood pressure at goal levels.   Lipids  Yes   Intervention Provide education and support for participant on nutrition & aerobic/resistive exercise along with prescribed medications to achieve LDL '70mg'$ , HDL >'40mg'$ .   Expected Outcomes Short Term: Participant states understanding of desired cholesterol values and is compliant with medications prescribed. Participant is following exercise prescription and nutrition guidelines.;Long Term: Cholesterol controlled with medications as prescribed, with individualized exercise RX and with personalized nutrition plan. Value goals: LDL < '70mg'$ , HDL > 40 mg.   Stress Yes  Lives on second floor with steps only to get into apartment.  Symptoms occur everytime when walking up steps.  HAs had MD write note for her to move to first floor apartment and has taken letter to landlord.    Intervention Offer individual and/or small group education and counseling on adjustment to heart disease, stress management and health-related lifestyle change. Teach and support self-help strategies.   Expected Outcomes Short Term: Participant demonstrates changes in health-related behavior, relaxation and other stress management skills, ability to obtain effective social support, and compliance with psychotropic medications if prescribed.;Long Term: Emotional wellbeing is indicated by absence of clinically significant psychosocial distress or social isolation.      Core Components/Risk Factors/Patient Goals Review:      Goals and Risk Factor Review    Row Name 12/19/15 0916076/19/17 1349 01/07/16 0940         Core Components/Risk Factors/Patient Goals Review   Personal Goals Review Weight Management/Obesity;Sedentary;Hypertension;Lipids Sedentary;Weight Management/Obesity;Increase Strength and Stamina;Hypertension;Lipids;Diabetes;Stress Weight Management/Obesity;Increase Strength and Stamina;Diabetes;Hypertension;Lipids;Stress;Sedentary     Review Antonia got dizzy on the treadmill so stopped and sat down. Blood pressure  was 102/70 so given 240 cc of water then it was 102/60. Blood sugar was 158. After 2nd cup of water 240 cc blood pressure was up to 114/70. Cindie said she is just very tired since "how do you get any sleep with 4 children sleeping in a bed with me. My son is working 2 jobs so I have them for the summer although they do get to go to day care so Zsazsa said she was going back home to go to sleep. ViJocelyn Lamers doing well with exercise.  She comes to class consistently.  Her weights have been averaging between 244-248.  Her blood pressure are in the 120s/60s for resting.  No problems with her lipid medication.  She is not letting stress get the better of her any longer.  She is walk for an hour everyday to and from the mailbox.  He blood sugars have also been going down overall. Waylon has been doing well in program.  He weight has been steady around 247-248.  She has been doing some walking at home on days she is not coming to program.  Her blood sugars have a steady pattern and she was encouraged to keep a jouranal about it to discuss with her doctor.  They have been high in the morning (200s) and low after dinner (60s).  She is going to also try adding in more protein to her bed time snack.  Her blood pressure and statin meds are doing well.  Her stress levels are up now that she is moving from the third floor down to the  first floor but this should improve after the move.     Expected Outcomes Cont to exercise 3 times a week.  Ranyia will continue to make improvements across the board.  We will continue to monitor for progress. Marijayne will continue to exercise in program and work on weight loss.  We will continue to check on her home blood sugars as well.  Once she is done moving, her overall stress level will come down and she should be able to exercise at home more since she will no longer have to navigate the stairs.        Core Components/Risk Factors/Patient Goals at Discharge (Final Review):      Goals  and Risk Factor Review - 01/07/16 0940      Core Components/Risk Factors/Patient Goals Review   Personal Goals Review Weight Management/Obesity;Increase Strength and Stamina;Diabetes;Hypertension;Lipids;Stress;Sedentary   Review Zaia has been doing well in program.  He weight has been steady around 247-248.  She has been doing some walking at home on days she is not coming to program.  Her blood sugars have a steady pattern and she was encouraged to keep a jouranal about it to discuss with her doctor.  They have been high in the morning (200s) and low after dinner (60s).  She is going to also try adding in more protein to her bed time snack.  Her blood pressure and statin meds are doing well.  Her stress levels are up now that she is moving from the third floor down to the first floor but this should improve after the move.   Expected Outcomes Mairely will continue to exercise in program and work on weight loss.  We will continue to check on her home blood sugars as well.  Once she is done moving, her overall stress level will come down and she should be able to exercise at home more since she will no longer have to navigate the stairs.      ITP Comments:     ITP Comments    Row Name 11/18/15 1648 11/23/15 1308 12/19/15 0834 12/19/15 0911 12/22/15 1352   ITP Comments INitial medical evaluation completed   POE:[id number] with ITP To start sesions Monday May 22  30 day review. Continue with ITP. Orientation completed to start sessions on May 22 Namiko arrives later since she drops off her grandchildren at day care. Ady is "unable to attend the 4pm Cardiac Rehab class since she is watching her grandchildren since her husband works." Mera has been able to attend all 3 days this week of [place].  Addysen got dizzy on the treadmill so stopped and sat down. Blood pressure was 102/70 so given 240 cc of water then it was 102/60. Blood sugar was 158. After 2nd cup of water 240 cc blood pressure was up  to 114/70. Marriah said she is just very tired since "how do you get any sleep with 4 children sleeping in a bed with me. My son is working 2 jobs so I have them for the summer although they do get to go to day care so Severa said she was going back home to go to sleep. Since Myles arrives late to class, she has not been able to attend all of the education classes.   [place] Name 12/24/15 0730 12/29/15 0833 01/23/16 0904 02/18/16 0804     ITP Comments 30 day review.  Continue with ITP  Continues to arive daily for her exerxie session.  Has 4 grandchildren at  home wiht her and she takes all to camp before arriving here for her session. Alycia came in today and did not feel well. Her primary symptom was dizziness. She woke up with a fasting blood glucose of 46 mg/dL. She did eat breakfast before coming to class.  Her blood glucose was checked upon arrival and was 152 mg/dL. Her blood pressure was 118/64. She had a scheduled doctors appointment with Dr. Nehemiah Massed this morning.  She was advised not to exercise and was taken to her doctors office ( on campus) by a Lobbyist. Pt stated that she had a cath for pain, but there was no findings and determined to just be heartburn.  There are no notes documented in [facility] or Care Everywhere.  We will continue to monitor. 30 day review. Continue with ITP unless changes noted by Medical Director at signature of review.       Comments:

## 2016-02-20 ENCOUNTER — Encounter: Payer: Medicare Other | Admitting: *Deleted

## 2016-02-20 DIAGNOSIS — Z9861 Coronary angioplasty status: Secondary | ICD-10-CM

## 2016-02-20 DIAGNOSIS — I214 Non-ST elevation (NSTEMI) myocardial infarction: Secondary | ICD-10-CM | POA: Diagnosis not present

## 2016-02-20 DIAGNOSIS — I213 ST elevation (STEMI) myocardial infarction of unspecified site: Secondary | ICD-10-CM

## 2016-02-20 NOTE — Progress Notes (Signed)
Daily Session Note  Patient Details  Name: Felicia Woods MRN: [id number] Date of Birth: 04/06/1956 Referring Provider:   Flowsheet Row Cardiac Rehab from 11/26/2015 in [facility] Cardiac and Pulmonary Rehab  Referring Provider  Quay Burow MD      Encounter Date: 02/20/2016  Check In:     Session Check In - 02/20/16 0858      Check-In   Location [facility]-Cardiac & Pulmonary Rehab   Staff Present Alberteen Sam, MA, ACSM RCEP, Exercise Physiologist;Amanda Oletta Darter, BA, ACSM CEP, Exercise Physiologist;Carroll Enterkin, RN, BSN   Supervising physician immediately available to respond to emergencies See telemetry face sheet for immediately available ER MD   Medication changes reported     No   Fall or balance concerns reported    No   Warm-up and Cool-down Performed on first and last piece of equipment   Resistance Training Performed Yes   VAD Patient? No     Pain Assessment   Currently in Pain? No/denies   Multiple Pain Sites No         Goals Met:  Independence with exercise equipment Exercise tolerated well No report of cardiac concerns or symptoms  Goals Unmet:  Not Applicable  Comments: Pt able to follow exercise prescription today without complaint.  Will continue to monitor for progression. Reviewed home exercise with pt today.  Pt plans to walk at home for exercise.  Reviewed THR, pulse, RPE, sign and symptoms, NTG use, and when to call 911 or MD.  Also discussed weather considerations and indoor options.  Pt voiced understanding.    Dr. Emily Filbert is Medical Director for [place] and [facility].

## 2016-03-01 DIAGNOSIS — M25561 Pain in right knee: Secondary | ICD-10-CM | POA: Diagnosis not present

## 2016-03-05 ENCOUNTER — Encounter: Payer: Medicare Other | Attending: Cardiovascular Disease

## 2016-03-05 DIAGNOSIS — I214 Non-ST elevation (NSTEMI) myocardial infarction: Secondary | ICD-10-CM | POA: Insufficient documentation

## 2016-03-05 DIAGNOSIS — Z9861 Coronary angioplasty status: Secondary | ICD-10-CM | POA: Insufficient documentation

## 2016-03-10 ENCOUNTER — Telehealth: Payer: Self-pay | Admitting: *Deleted

## 2016-03-10 ENCOUNTER — Encounter: Payer: Self-pay | Admitting: *Deleted

## 2016-03-10 DIAGNOSIS — Z9861 Coronary angioplasty status: Secondary | ICD-10-CM

## 2016-03-10 DIAGNOSIS — I213 ST elevation (STEMI) myocardial infarction of unspecified site: Secondary | ICD-10-CM

## 2016-03-10 NOTE — Telephone Encounter (Signed)
Pt has been out since 8/18 with knee pain.  Called to check on status.

## 2016-03-11 DIAGNOSIS — M25561 Pain in right knee: Secondary | ICD-10-CM | POA: Diagnosis not present

## 2016-03-11 DIAGNOSIS — S83241A Other tear of medial meniscus, current injury, right knee, initial encounter: Secondary | ICD-10-CM | POA: Diagnosis not present

## 2016-03-16 ENCOUNTER — Encounter: Payer: Self-pay | Admitting: *Deleted

## 2016-03-16 ENCOUNTER — Telehealth: Payer: Self-pay | Admitting: *Deleted

## 2016-03-16 DIAGNOSIS — Z9861 Coronary angioplasty status: Secondary | ICD-10-CM

## 2016-03-16 DIAGNOSIS — I213 ST elevation (STEMI) myocardial infarction of unspecified site: Secondary | ICD-10-CM

## 2016-03-16 NOTE — Progress Notes (Signed)
Cardiac Individual Treatment Plan  Patient Details  Name: Felicia Woods MRN: [id number] Date of Birth: 1956/05/22 Referring Provider:   Flowsheet Row Cardiac Rehab from 11/26/2015 in [facility] Cardiac and Pulmonary Rehab  Referring Provider  Quay Burow MD      Initial Encounter Date:  Flowsheet Row Cardiac Rehab from 11/26/2015 in [facility] Cardiac and Pulmonary Rehab  Date  11/26/15  Referring Provider  Quay Burow MD      Visit Diagnosis: ST elevation myocardial infarction (STEMI), unspecified artery ([place])  S/P PTCA (percutaneous transluminal coronary angioplasty)  Patient's Home Medications on Admission:  Current Outpatient Prescriptions:  .  amLODipine (NORVASC) 10 MG tablet, Take 1 tablet (10 mg total) by mouth daily., Disp: 30 tablet, Rfl: 3 .  aspirin EC 81 MG tablet, Take 81 mg by mouth daily., Disp: , Rfl:  .  atorvastatin (LIPITOR) 80 MG tablet, Take 1 tablet (80 mg total) by mouth daily at 6 PM., Disp: 30 tablet, Rfl: 3 .  augmented betamethasone dipropionate (DIPROLENE-AF) 0.05 % cream, Apply 1 application topically daily. Reported on 11/18/2015, Disp: , Rfl:  .  carvedilol (COREG) 3.125 MG tablet, Take 1 tablet (3.125 mg total) by mouth 2 (two) times daily with a meal., Disp: 60 tablet, Rfl: 3 .  clopidogrel (PLAVIX) 75 MG tablet, Take 75 mg by mouth daily., Disp: , Rfl:  .  cyclobenzaprine (FLEXERIL) 10 MG tablet, Take 10 mg by mouth at bedtime. , Disp: , Rfl:  .  estradiol (ESTRACE) 0.5 MG tablet, Take 0.5 mg by mouth daily. , Disp: , Rfl:  .  furosemide (LASIX) 40 MG tablet, Take 40 mg by mouth daily. , Disp: , Rfl:  .  HYDROcodone-acetaminophen (NORCO/VICODIN) 5-325 MG tablet, Take 1 tablet by mouth every 8 (eight) hours as needed. For pain., Disp: , Rfl:  .  losartan (COZAAR) 50 MG tablet, Take 50 mg by mouth daily. , Disp: , Rfl:  .  meclizine (ANTIVERT) 25 MG tablet, Take 1 tablet (25 mg total) by mouth 3 (three) times daily as needed for dizziness. (Patient not  taking: Reported on 11/18/2015), Disp: 30 tablet, Rfl: 0 .  metFORMIN (GLUCOPHAGE) 500 MG tablet, Take 250 mg by mouth 2 (two) times daily with a meal. , Disp: , Rfl:  .  montelukast (SINGULAIR) 10 MG tablet, Take 10 mg by mouth daily. , Disp: , Rfl:  .  nitroGLYCERIN (NITROSTAT) 0.4 MG SL tablet, Place 0.4 mg under the tongue every 5 (five) minutes x 3 doses as needed for chest pain. *If no relief call MD or go to Emergency Room*, Disp: , Rfl:  .  raloxifene (EVISTA) 60 MG tablet, Take 60 mg by mouth daily. , Disp: , Rfl:  .  ranitidine (ZANTAC) 150 MG tablet, Take 150 mg by mouth daily. , Disp: , Rfl:  .  sertraline (ZOLOFT) 100 MG tablet, Take 100 mg by mouth daily. , Disp: , Rfl:  .  ticagrelor (BRILINTA) 90 MG TABS tablet, Take 1 tablet (90 mg total) by mouth 2 (two) times daily. (Patient not taking: Reported on 12/03/2015), Disp: 60 tablet, Rfl: 11 .  VENTOLIN HFA 108 (90 BASE) MCG/ACT inhaler, Inhale 2 puffs into the lungs every 6 (six) hours as needed for wheezing or shortness of breath. , Disp: , Rfl:  .  VESICARE 10 MG tablet, Take 10 mg by mouth daily. , Disp: , Rfl:  .  zolpidem (AMBIEN) 10 MG tablet, Take 10 mg by mouth at bedtime., Disp: , Rfl:   Past  Medical History: Past Medical History:  Diagnosis Date  . Coronary artery disease   . Diabetes mellitus without complication ([place])   . Hypertension   . MI (myocardial infarction) ([place])     Tobacco Use: History  Smoking Status  . Never Smoker  Smokeless Tobacco  . Not on file    Labs: Recent Review Flowsheet Data    Labs for ITP Cardiac and Pulmonary Rehab Latest Ref Rng & Units 10/18/2015 10/20/2015   Cholestrol 0 - 200 mg/dL - 153   LDLCALC 0 - 99 mg/dL - 97   HDL >40 mg/dL - 31(L)   Trlycerides <150 mg/dL - 123   TCO2 0 - 100 mmol/L 21 -       Exercise Target Goals:    Exercise Program Goal: Individual exercise prescription set with THRR, safety & activity barriers. Participant demonstrates ability to understand  and report RPE using BORG scale, to self-measure pulse accurately, and to acknowledge the importance of the exercise prescription.  Exercise Prescription Goal: Starting with aerobic activity 30 plus minutes a day, 3 days per week for initial exercise prescription. Provide home exercise prescription and guidelines that participant acknowledges understanding prior to discharge.  Activity Barriers & Risk Stratification:     Activity Barriers & Cardiac Risk Stratification - 11/18/15 1213      Activity Barriers & Cardiac Risk Stratification   Activity Barriers Shortness of Breath;Chest Pain/Angina  Squeezing in the chest and SOB with exertion, resting resovles symptoms.  Can occur 2-3 times a day.  MD is aware.   Cardiac Risk Stratification High      6 Minute Walk:     6 Minute Walk    Row Name 11/18/15 1458 11/18/15 1503       6 Minute Walk   Phase Initial  -    Distance 770 feet  -    Walk Time 4 minutes  -    # of Rest Breaks 1  -    RPE 13  -    Symptoms  - Yes (comment)    Comments  - Pt stopped at 4 minutes of walk, stating "chest tightness" which resolved with rest.     Resting HR 94 bpm  -    Resting BP 126/70  -    Max Ex. HR 122 bpm  -    Max Ex. BP 134/72  -       Initial Exercise Prescription:     Initial Exercise Prescription - 11/26/15 0900      Date of Initial Exercise RX and Referring Provider   Date 11/26/15   Referring Provider Quay Burow MD     Treadmill   MPH 1.5   Grade 0   Minutes 15     Cybex   Level 10   RPM 25   Minutes 5     T5 Nustep   Level 3   Watts 26   Minutes 15     Intensity   THRR REST +  30   Ratings of Perceived Exertion 11-15   Perceived Dyspnea 0-4     Progression   Progression Continue to progress workloads to maintain intensity without signs/symptoms of physical distress.     Resistance Training   Training Prescription Yes   Weight 2   Reps 10-15      Perform Capillary Blood Glucose checks as  needed.  Exercise Prescription Changes:     Exercise Prescription Changes    Row Name 11/18/15 1300 12/03/15 1100  12/17/15 1400 12/31/15 1300 01/15/16 0800     Exercise Review   Progression  - Yes Yes  - Yes     Response to Exercise   Blood Pressure (Admit) 126/60 112/74 142/80 118/64 120/62   Blood Pressure (Exercise) 134/72 128/60 158/88 122/64 122/60   Blood Pressure (Exit) 132/82 102/68 124/72 110/62 128/64   Heart Rate (Admit) 94 bpm 73 bpm 89 bpm 87 bpm 62 bpm   Heart Rate (Exercise) 122 bpm 109 bpm 156 bpm 89 bpm 146 bpm   Heart Rate (Exit) 85 bpm 91 bpm 102 bpm 89 bpm 99 bpm   Rating of Perceived Exertion (Exercise)  - _0 Symptoms  - none none  - none   Duration  - Progress to 30 minutes of continuous aerobic without signs/symptoms of physical distress Progress to 45 minutes of aerobic exercise without signs/symptoms of physical distress Progress to 45 minutes of aerobic exercise without signs/symptoms of physical distress Progress to 45 minutes of aerobic exercise without signs/symptoms of physical distress   Intensity  - THRR New  117-147 THRR unchanged THRR unchanged THRR unchanged     Progression   Progression  - Continue to progress workloads to maintain intensity without signs/symptoms of physical distress. Continue to progress workloads to maintain intensity without signs/symptoms of physical distress. Continue to progress workloads to maintain intensity without signs/symptoms of physical distress. Continue to progress workloads to maintain intensity without signs/symptoms of physical distress.   Average METs  - 2.43 3.3  - 3.8     Resistance Training   Training Prescription  - Yes Yes Yes Yes   Weight  - 2 2  - -  Pt arrives late and misses weights regularly   Reps  - 10-15 10-15  -  -     Interval Training   Interval Training  - No No  - No     Treadmill   MPH  - 2.3 2.6  - 2.6   Grade  - 0 2.5  - 1.5   Minutes  - 15 20  - 15     NuStep   Level   -  - _1 Watts  -  - 60 31 -  4.3   Minutes  -  - 15  - 15     Cybex   Level  - 10  -  -  -   RPM  - 25  -  -  -   Minutes  - 5  -  -  -     REL-XR   Level  -  - 3  - 4   Watts  -  - 83  -  -   Minutes  -  - 15  - 15     T5 Nustep   Level  - 4  -  -  -   Watts  - 20  -  -  -   Minutes  - 15  -  -  -   Row Name 01/28/16 1100 02/12/16 1100 02/20/16 0900 02/26/16 1500       Exercise Review   Progression Yes Yes Yes Yes      Response to Exercise   Blood Pressure (Admit) 110/70 126/70  - 132/64    Blood Pressure (Exercise) 116/70 136/70  - 136/64    Blood Pressure (Exit) 124/74 126/64  - 110/78    Heart Rate (Admit) 86 bpm 91 bpm  -  57 bpm    Heart Rate (Exercise) 122 bpm 133 bpm  - 135 bpm    Heart Rate (Exit) 86 bpm 79 bpm  - 90 bpm    Rating of Perceived Exertion (Exercise) 13 13  - 13    Symptoms none knee pain knee pain knee pain    Comments  -  - Home Exercise Guidelines given 02/20/16 Home Exercise Guidelines given 02/20/16    Duration Progress to 30 minutes of continuous aerobic without signs/symptoms of physical distress Progress to 30 minutes of continuous aerobic without signs/symptoms of physical distress Progress to 30 minutes of continuous aerobic without signs/symptoms of physical distress Progress to 30 minutes of continuous aerobic without signs/symptoms of physical distress    Intensity THRR unchanged THRR unchanged THRR unchanged THRR unchanged      Progression   Progression Continue to progress workloads to maintain intensity without signs/symptoms of physical distress. Continue to progress workloads to maintain intensity without signs/symptoms of physical distress. Continue to progress workloads to maintain intensity without signs/symptoms of physical distress. Continue to progress workloads to maintain intensity without signs/symptoms of physical distress.    Average METs 4.25 2.53  knee pain 2.53  knee pain 2.5      Resistance Training   Training  Prescription Yes Yes Yes Yes    Weight -  Pt arrives late and misses weights regularly -  Pt arrives late and misses weights regularly -  Pt arrives late and misses weights regularly -  Pt arrives late and misses weights regularly      Interval Training   Interval Training No No No No      Treadmill   MPH 2.6 2.6 2.6 2.6    Grade 1.5 1.5 1.5 1.5    Minutes _0 NuStep   Level _1 Watts -  3.4 -  1.7 METs -  1.7 METs -  3.0 METs    Minutes _2 Elliptical   Level 1  -  - 1    Speed 2.5  -  - 2.5    Minutes 15  -  - 15    METs 6  -  - 6      Home Exercise Plan   Plans to continue exercise at  -  - Home  walking Home  walking    Frequency  -  - Add 3 additional days to program exercise sessions. Add 3 additional days to program exercise sessions.       Exercise Comments:     Exercise Comments    Row Name 11/18/15 1502 12/03/15 1105 12/08/15 0906 12/17/15 1408 12/19/15 0859   Exercise Comments Patient limited in 6 minute walk by "chest tightness" which resolved with rest Pt is doing well after second full day of exercise.  She has not had any recurring symptoms noted.  She is making progress in her workloads.  We will work on continuing to increase treadmill and trying other pieces of equipment. Reviewed new workloads with pt.  She was ameanable to changes.  We will add incline to treadmill at next visit as she wanted to walk faster today.  She tolerated changes well without signs or symptoms. Darin is doing well with exercise.  She is making good improvements and is up to 20 min now on treadmill without signs or symptoms!  Will continue to monitor for progress. Stasha  got dizzy on the treadmill so stopped and sat down. Blood pressure was 102/70 so given 240 cc of water then it was 102/60. Blood sugar was 158. After 2nd cup of water 240 cc blood pressure was up to 114/70. Adella said she is just very tired since "how do you get any sleep with 4  children sleeping in a bed with me. My son is working 2 jobs so I have them for the summer although they do get to go to day care so Christel said she was going back home to go to sleep.    [place] Name 01/15/16 7371 01/28/16 1127 02/12/16 1134 02/13/16 1005 02/20/16 0859   Exercise Comments Allien is doing well with her exercise.  She has made some progress when she is here.  She is walking at home some on the days that she is not here. Roneisha continues to do well with exercise.  She is getting her full 30 min each day.  We will continue to monitor for progress. Sahira has been doing well with exercise.  She has started to wear a knee brace as it has been bothering her at home.  She did see the doctor and was given some pain medication for it.  We will continue to monitor. Vicki's blood pressure dropped some after exercise. She was given water and felt better after it began to recover. Reviewed home exercise with pt today.  Pt plans to walk at home for exercise.  Reviewed THR, pulse, RPE, sign and symptoms, NTG use, and when to call 911 or MD.  Also discussed weather considerations and indoor options.  Pt voiced understanding.   [place] Name 02/26/16 1516 03/10/16 1616         Exercise Comments Coraima has been doing well with exercise.  She still has some knee pain.  We will continue to monitor for progression. Pt has been out since 8/18 with knee pain         Discharge Exercise Prescription (Final Exercise Prescription Changes):     Exercise Prescription Changes - 02/26/16 1500      Exercise Review   Progression Yes     Response to Exercise   Blood Pressure (Admit) 132/64   Blood Pressure (Exercise) 136/64   Blood Pressure (Exit) 110/78   Heart Rate (Admit) 57 bpm   Heart Rate (Exercise) 135 bpm   Heart Rate (Exit) 90 bpm   Rating of Perceived Exertion (Exercise) 13   Symptoms knee pain   Comments Home Exercise Guidelines given 02/20/16   Duration Progress to 30 minutes of continuous aerobic  without signs/symptoms of physical distress   Intensity THRR unchanged     Progression   Progression Continue to progress workloads to maintain intensity without signs/symptoms of physical distress.   Average METs 2.5     Resistance Training   Training Prescription Yes   Weight --  Pt arrives late and misses weights regularly     Interval Training   Interval Training No     Treadmill   MPH 2.6   Grade 1.5   Minutes 15     NuStep   Level 5   Watts --  3.0 METs   Minutes 15     Elliptical   Level 1   Speed 2.5   Minutes 15   METs 6     Home Exercise Plan   Plans to continue exercise at Home  walking   Frequency Add 3 additional days to program exercise sessions.  Nutrition:  Target Goals: Understanding of nutrition guidelines, daily intake of sodium <1555m, cholesterol <2080m calories 30% from fat and 7% or less from saturated fats, daily to have 5 or more servings of fruits and vegetables.  Biometrics:     Pre Biometrics - 11/18/15 1457      Pre Biometrics   Height 5' 6.25" (1.683 m)   Weight 253 lb (114.8 kg)   Waist Circumference 49 inches   Hip Circumference 55 inches   Waist to Hip Ratio 0.89 %   BMI (Calculated) 40.6       Nutrition Therapy Plan and Nutrition Goals:     Nutrition Therapy & Goals - 12/12/15 1110      Nutrition Therapy   Diet Instructed on a meal plan based on dietary guidelines for diabetes and DASH diet principles based on 1600 calories.   Drug/Food Interactions Statins/Certain Fruits   Protein (specify units) 6   Fiber 20 grams   Whole Grain Foods 3 servings   Saturated Fats 11 max. grams   Fruits and Vegetables 5 servings/day   Sodium 1500 grams     Personal Nutrition Goals   Personal Goal #1 Balance meals with protein, 1-2 starch servings, non-starchy vegetables. Can add a small serving of fruit- Refer to handout.   Personal Goal #2 Increase "free/non-starchy" vegetables.   Personal Goal #3 Read labels for  saturated fat and trans fat and sodium.     Intervention Plan   Intervention Prescribe, educate and counsel regarding individualized specific dietary modifications aiming towards targeted core components such as weight, hypertension, lipid management, diabetes, heart failure and other comorbidities.;Nutrition handout(s) given to patient.   Expected Outcomes Short Term Goal: Understand basic principles of dietary content, such as calories, fat, sodium, cholesterol and nutrients.;Long Term Goal: Adherence to prescribed nutrition plan.;Short Term Goal: A plan has been developed with personal nutrition goals set during dietitian appointment.      Nutrition Discharge: Rate Your Plate Scores:     Nutrition Assessments - 12/12/15 1114      Rate Your Plate Scores   Pre Score 52   Pre Score % 58 %      Nutrition Goals Re-Evaluation:   Psychosocial: Target Goals: Acknowledge presence or absence of depression, maximize coping skills, provide positive support system. Participant is able to verbalize types and ability to use techniques and skills needed for reducing stress and depression.  Initial Review & Psychosocial Screening:     Initial Psych Review & Screening - 11/18/15 1230      Initial Review   Current issues with Current Sleep Concerns  Takes sleeping pills and they do not help.MD is aware as of today.. Marland Kitchenpt stairs and symptoms, MD has written letter to help Kiti[place] move to first level apartment     [place]  Husband and 2 sons and a daughter     Barriers   Psychosocial barriers to participate in program There are no identifiable barriers or psychosocial needs.     Screening Interventions   Interventions Encouraged to exercise      Quality of Life Scores:     Quality of Life - 11/18/15 1651      Quality of Life Scores   Health/Function Pre 17.3 %   Socioeconomic Pre 15.58 %   Psych/Spiritual Pre 25.71 %   Family Pre 25.2 %   GLOBAL  Pre 19.97 %      PHQ-9: Recent Review Flowsheet Data  Depression screen [facility] 2/9 11/18/2015 02/10/2015   Decreased Interest 1 0   Down, Depressed, Hopeless 2 0    PHQ - 2 Score 3 0   Altered sleeping 3 -   Tired, decreased energy 3 -   Change in appetite 2 -   Feeling bad or failure about yourself  0 -   Trouble concentrating 0 -   Moving slowly or fidgety/restless 0 -   Suicidal thoughts 0 -   PHQ-9 Score 11 -   Difficult doing work/chores Somewhat difficult -      Psychosocial Evaluation and Intervention:     Psychosocial Evaluation - 11/26/15 0937      Psychosocial Evaluation & Interventions   Interventions Stress management education;Relaxation education;Encouraged to exercise with the program and follow exercise prescription   Comments Counselor met with Ms. Prevette today for initial psychosocial evaluation.  She is a 60 year old who had a heart attack and stent inserted on 4/15.  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  She has a strong support system with a spouse, adult children who live close by and active participation in her church community.  Ms. Dosanjh reports not sleeping well but was just prescribed Ambien for this and has not yet begun taking it.  She denies a history of depression or anxiety or current symptoms.  She states her mood is typically positive even though she has multiple health issues and her father, who lives in the mountains of [place], has bone cancer, which is stressful for Ms. Mcginniss at times.  She has goals to improve her health overall, to be educated on healthier eating/nutrition, and to lose some weight while in this program.  She plans to look into a follow up program here at [facility] upon completion of this program.     Continued Psychosocial Services Needed Yes  Ms. Stash will benefit from psychoeducation on stress and relaxation while in this program.       Psychosocial Re-Evaluation:     Psychosocial Re-Evaluation     Row Name 12/19/15 [phone] 01/14/16 0859 01/14/16 0903 01/14/16 0909 01/16/16 0943     Psychosocial Re-Evaluation   Interventions Encouraged to attend Cardiac Rehabilitation for the exercise  -  -  -  -   Comments Naturi arrives later since she drops off her grandchildren at day care. Ala is "unable to attend the 4pm Cardiac Rehab class since she is watching her grandchildren since her husband works." Alene has been able to attend all 3 days this week of [place].  Counselor follow up with Ms. Caratachea today, reporting she has experienced more energy since coming into this program and exercising more consistently.  However, she still struggles with the stairs in her apartment and is continuing to request a lower level unit as a result.  Ms. Alber asked counselor about the "Nutrisystem" program for a dietary option and counselor encouraged her to speak with the dietician.  Counselor questioned Ms. Runnion about her Counselor follow up with Ms. Grime today, reporting she has experienced more energy since coming into this program and exercising more consistently.  However, she still struggles with the stairs in her apartment and is continuing to request a lower level unit as a result.  Ms. Santini asked counselor about the "Nutrisystem" program for a dietary option and counselor encouraged her to speak with the dietician.  Counselor questioned Ms. Marzano about her medications since she denied a history of depression or  anxiety but is on '100mg'$  of Sertraline.  Ms. Detore stated she "just takes what the Drs give me" and thought it was part of her heart medicine regimen.  Counselor encouraged Ms. Kreher to speak with her Dr. about this at her next appointment.  She also reports ongoing sleep difficulties and counselor mentioned speaking to her Dr. or pharmacist about a natural OTC sleep aid to help with this.  Counselor will continue to follow with Ms. Ress throughout the course of this program.    Counselor follow up with Ms. Trautmann today, reporting she has experienced more energy since coming into this program and exercising more consistently.  However, she still struggles with the stairs in her apartment and is continuing to request a lower level unit as a result.  Ms. Ask asked counselor about the "Nutrisystem" program for a dietary option and counselor encouraged her to speak with the dietician.  Counselor questioned Ms. Rizo about her Lurine takes her husband to work and then comes to exercise in [place]. Mirtie is still watching her 4 grandchildren while her son works 2 jobs.       Vocational Rehabilitation: Provide vocational rehab assistance to qualifying candidates.   Vocational Rehab Evaluation & Intervention:     Vocational Rehab - 11/18/15 1220      Initial Vocational Rehab Evaluation & Intervention   Assessment shows need for Vocational Rehabilitation No      Education: Education Goals: Education classes will be provided on a weekly basis, covering required topics. Participant will state understanding/return demonstration of topics presented.  Learning Barriers/Preferences:     Learning Barriers/Preferences - 11/18/15 1219      Learning Barriers/Preferences   Learning Barriers None   Learning Preferences Individual Instruction;Verbal Instruction      Education Topics: General Nutrition Guidelines/Fats and Fiber: -Group instruction provided by verbal, written material, models and posters to present the general guidelines for heart healthy nutrition. Gives an explanation and review of dietary fats and fiber. Flowsheet Row Cardiac Rehab from 02/09/2016 in [facility] Cardiac and Pulmonary Rehab  Date  12/08/15  Educator  Jaclyn Shaggy  Instruction Review Code  2- meets goals/outcomes      Controlling Sodium/Reading Food Labels: -Group verbal and written material supporting the discussion of sodium use in heart healthy nutrition. Review and explanation with  models, verbal and written materials for utilization of the food label. Flowsheet Row Cardiac Rehab from 02/09/2016 in [facility] Cardiac and Pulmonary Rehab  Date  02/09/16  Educator  CR  Instruction Review Code  R- Review/reinforce [Second Class]      Exercise Physiology & Risk Factors: - Group verbal and written instruction with models to review the exercise physiology of the cardiovascular system and associated critical values. Details cardiovascular disease risk factors and the goals associated with each risk factor.   Aerobic Exercise & Resistance Training: - Gives group verbal and written discussion on the health impact of inactivity. On the components of aerobic and resistive training programs and the benefits of this training and how to safely progress through these programs.   Flexibility, Balance, General Exercise Guidelines: - Provides group verbal and written instruction on the benefits of flexibility and balance training programs. Provides general exercise guidelines with specific guidelines to those with heart or lung disease. Demonstration and skill practice provided.   Stress Management: - Provides group verbal and written instruction about the health risks of elevated stress, cause of high stress, and healthy ways to reduce stress.   Depression: - Provides  group verbal and written instruction on the correlation between heart/lung disease and depressed mood, treatment options, and the stigmas associated with seeking treatment. Flowsheet Row Cardiac Rehab from 02/09/2016 in [facility]  Date  12/03/15  Educator  Kathreen Cornfield, [facility]  Instruction Review Code  2- meets goals/outcomes      Anatomy & Physiology of the Heart: - Group verbal and written instruction and models provide basic cardiac anatomy and physiology, with the coronary electrical and arterial systems. Review of: AMI, Angina, Valve disease, Heart Failure, Cardiac Arrhythmia, Pacemakers, and the  ICD.   Cardiac Procedures: - Group verbal and written instruction and models to describe the testing methods done to diagnose heart disease. Reviews the outcomes of the test results. Describes the treatment choices: Medical Management, Angioplasty, or Coronary Bypass Surgery.   Cardiac Medications: - Group verbal and written instruction to review commonly prescribed medications for heart disease. Reviews the medication, class of the drug, and side effects. Includes the steps to properly store meds and maintain the prescription regimen.   Go Sex-Intimacy & Heart Disease, Get SMART - Goal Setting: - Group verbal and written instruction through game format to discuss heart disease and the return to sexual intimacy. Provides group verbal and written material to discuss and apply goal setting through the application of the S.M.A.R.T. Method.   Other Matters of the Heart: - Provides group verbal, written materials and models to describe Heart Failure, Angina, Valve Disease, and Diabetes in the realm of heart disease. Includes description of the disease process and treatment options available to the cardiac patient.   Exercise & Equipment Safety: - Individual verbal instruction and demonstration of equipment use and safety with use of the equipment. Flowsheet Row Cardiac Rehab from 02/09/2016 in [facility]  Date  11/18/15  Educator  SB  Instruction Review Code  2- meets goals/outcomes      Infection Prevention: - Provides verbal and written material to individual with discussion of infection control including proper hand washing and proper equipment cleaning during exercise session. Flowsheet Row Cardiac Rehab from 02/09/2016 in [facility]  Date  11/18/15  Educator  SB  Instruction Review Code  2- meets goals/outcomes      Falls Prevention: - Provides verbal and written material to individual with discussion of falls prevention and  safety. Flowsheet Row Cardiac Rehab from 02/09/2016 in [facility]  Date  11/18/15  Educator  SB  Instruction Review Code  2- meets goals/outcomes      Diabetes: - Individual verbal and written instruction to review signs/symptoms of diabetes, desired ranges of glucose level fasting, after meals and with exercise. Advice that pre and post exercise glucose checks will be done for 3 sessions at entry of program. [place] from 02/09/2016 in [facility]  Date  11/18/15  Educator  SB  Instruction Review Code  2- meets goals/outcomes       Knowledge Questionnaire Score:     Knowledge Questionnaire Score - 11/18/15 1651      Knowledge Questionnaire Score   Pre Score 14/28      Core Components/Risk Factors/Patient Goals at Admission:     Personal Goals and Risk Factors at Admission - 11/18/15 1220      Core Components/Risk Factors/Patient Goals on Admission    Weight Management Yes;Obesity   Intervention Weight Management: Develop a combined nutrition and exercise program designed to reach desired caloric intake,  while maintaining appropriate intake of nutrient and fiber, sodium and fats, and appropriate energy expenditure required for the weight goal.;Weight Management: Provide education and appropriate resources to help participant work on and attain dietary goals.;Weight Management/Obesity: Establish reasonable short term and long term weight goals.;Obesity: Provide education and appropriate resources to help participant work on and attain dietary goals.   Admit Weight 253 lb (114.8 kg)   Goal Weight: Short Term 245 lb (111.1 kg)   Goal Weight: Long Term 180 lb (81.6 kg)   Expected Outcomes Short Term: Continue to assess and modify interventions until short term weight is achieved;Long Term: Adherence to nutrition and physical activity/exercise program aimed toward attainment of established weight goal   Sedentary Yes    Intervention Provide advice, education, support and counseling about physical activity/exercise needs.;Develop an individualized exercise prescription for aerobic and resistive training based on initial evaluation findings, risk stratification, comorbidities and participant's personal goals.   Expected Outcomes Achievement of increased cardiorespiratory fitness and enhanced flexibility, muscular endurance and strength shown through measurements of functional capacity and personal statement of participant.   Diabetes Yes   Intervention Provide education about signs/symptoms and action to take for hypo/hyperglycemia.;Provide education about proper nutrition, including hydration, and aerobic/resistive exercise prescription along with prescribed medications to achieve blood glucose in normal ranges: Fasting glucose 65-99 mg/dL   Expected Outcomes Short Term: Participant verbalizes understanding of the signs/symptoms and immediate care of hyper/hypoglycemia, proper foot care and importance of medication, aerobic/resistive exercise and nutrition plan for blood glucose control.;Long Term: Attainment of HbA1C < 7%.   Hypertension Yes   Intervention Provide education on lifestyle modifcations including regular physical activity/exercise, weight management, moderate sodium restriction and increased consumption of fresh fruit, vegetables, and low fat dairy, alcohol moderation, and smoking cessation.;Monitor prescription use compliance.   Expected Outcomes Short Term: Continued assessment and intervention until BP is < 140/6m HG in hypertensive participants. < 130/815mHG in hypertensive participants with diabetes, heart failure or chronic kidney disease.;Long Term: Maintenance of blood pressure at goal levels.   Lipids Yes   Intervention Provide education and support for participant on nutrition & aerobic/resistive exercise along with prescribed medications to achieve LDL '70mg'$ , HDL >'40mg'$ .   Expected Outcomes Short  Term: Participant states understanding of desired cholesterol values and is compliant with medications prescribed. Participant is following exercise prescription and nutrition guidelines.;Long Term: Cholesterol controlled with medications as prescribed, with individualized exercise RX and with personalized nutrition plan. Value goals: LDL < '70mg'$ , HDL > 40 mg.   Stress Yes  Lives on second floor with steps only to get into apartment.  Symptoms occur everytime when walking up steps.  HAs had MD write note for her to move to first floor apartment and has taken letter to landlord.    Intervention Offer individual and/or small group education and counseling on adjustment to heart disease, stress management and health-related lifestyle change. Teach and support self-help strategies.   Expected Outcomes Short Term: Participant demonstrates changes in health-related behavior, relaxation and other stress management skills, ability to obtain effective social support, and compliance with psychotropic medications if prescribed.;Long Term: Emotional wellbeing is indicated by absence of clinically significant psychosocial distress or social isolation.      Core Components/Risk Factors/Patient Goals Review:      Goals and Risk Factor Review    Row Name 12/19/15 [id number]/19/17 [id number] 01/07/16 0940         Core Components/Risk Factors/Patient Goals Review   Personal Goals Review Weight Management/Obesity;Sedentary;Hypertension;Lipids Sedentary;Weight Management/Obesity;Increase  Strength and Stamina;Hypertension;Lipids;Diabetes;Stress Weight Management/Obesity;Increase Strength and Stamina;Diabetes;Hypertension;Lipids;Stress;Sedentary     Review Ranyah got dizzy on the treadmill so stopped and sat down. Blood pressure was 102/70 so given 240 cc of water then it was 102/60. Blood sugar was 158. After 2nd cup of water 240 cc blood pressure was up to 114/70. Kimyetta said she is just very tired since "how do you get any  sleep with 4 children sleeping in a bed with me. My son is working 2 jobs so I have them for the summer although they do get to go to day care so Shalena said she was going back home to go to sleep. Jocelyn Lamer is doing well with exercise.  She comes to class consistently.  Her weights have been averaging between 244-248.  Her blood pressure are in the 120s/60s for resting.  No problems with her lipid medication.  She is not letting stress get the better of her any longer.  She is walk for an hour everyday to and from the mailbox.  He blood sugars have also been going down overall. Lakresha has been doing well in program.  He weight has been steady around 247-248.  She has been doing some walking at home on days she is not coming to program.  Her blood sugars have a steady pattern and she was encouraged to keep a jouranal about it to discuss with her doctor.  They have been high in the morning (200s) and low after dinner (60s).  She is going to also try adding in more protein to her bed time snack.  Her blood pressure and statin meds are doing well.  Her stress levels are up now that she is moving from the third floor down to the first floor but this should improve after the move.     Expected Outcomes Cont to exercise 3 times a week.  Hooria will continue to make improvements across the board.  We will continue to monitor for progress. Shevy will continue to exercise in program and work on weight loss.  We will continue to check on her home blood sugars as well.  Once she is done moving, her overall stress level will come down and she should be able to exercise at home more since she will no longer have to navigate the stairs.        Core Components/Risk Factors/Patient Goals at Discharge (Final Review):      Goals and Risk Factor Review - 01/07/16 0940      Core Components/Risk Factors/Patient Goals Review   Personal Goals Review Weight Management/Obesity;Increase Strength and  Stamina;Diabetes;Hypertension;Lipids;Stress;Sedentary   Review Deari has been doing well in program.  He weight has been steady around 247-248.  She has been doing some walking at home on days she is not coming to program.  Her blood sugars have a steady pattern and she was encouraged to keep a jouranal about it to discuss with her doctor.  They have been high in the morning (200s) and low after dinner (60s).  She is going to also try adding in more protein to her bed time snack.  Her blood pressure and statin meds are doing well.  Her stress levels are up now that she is moving from the third floor down to the first floor but this should improve after the move.   Expected Outcomes Pattie will continue to exercise in program and work on weight loss.  We will continue to check on her home blood sugars as well.  Once she is done moving, her overall stress level will come down and she should be able to exercise at home more since she will no longer have to navigate the stairs.      ITP Comments:     ITP Comments    Row Name 11/18/15 1648 11/23/15 1308 12/19/15 0834 12/19/15 0911 12/22/15 1352   ITP Comments INitial medical evaluation completed   UKG:[id number] with ITP To start sesions Monday May 22  30 day review. Continue with ITP. Orientation completed to start sessions on May 22 Cheri arrives later since she drops off her grandchildren at day care. Waynetta is "unable to attend the 4pm Cardiac Rehab class since she is watching her grandchildren since her husband works." Leahanna has been able to attend all 3 days this week of [place].  Taneesha got dizzy on the treadmill so stopped and sat down. Blood pressure was 102/70 so given 240 cc of water then it was 102/60. Blood sugar was 158. After 2nd cup of water 240 cc blood pressure was up to 114/70. Ryland said she is just very tired since "how do you get any sleep with 4 children sleeping in a bed with me. My son is working 2 jobs so I have them for the  summer although they do get to go to day care so Leonette said she was going back home to go to sleep. Since Thana arrives late to class, she has not been able to attend all of the education classes.   [place] Name 12/24/15 0730 12/29/15 0833 01/23/16 0904 02/18/16 0804 03/10/16 1617   ITP Comments 30 day review.  Continue with ITP  Continues to arive daily for her exerxie session.  Has 4 grandchildren at home wiht her and she takes all to camp before arriving here for her session. Rosealyn came in today and did not feel well. Her primary symptom was dizziness. She woke up with a fasting blood glucose of 46 mg/dL. She did eat breakfast before coming to class.  Her blood glucose was checked upon arrival and was 152 mg/dL. Her blood pressure was 118/64. She had a scheduled doctors appointment with Dr. Nehemiah Massed this morning.  She was advised not to exercise and was taken to her doctors office ( on campus) by a Lobbyist. Pt stated that she had a cath for pain, but there was no findings and determined to just be heartburn.  There are no notes documented in [facility] or Care Everywhere.  We will continue to monitor. 30 day review. Continue with ITP unless changes noted by Medical Director at signature of review. Pt has been out since 8/18 with knee pain   Row Name 03/16/16 1135           ITP Comments Called pt to check on status.  She is still wearing brace and in a lot of pain.  May need total knee replacement.  She was scheduled to graduate on Friday, but requested to be discharged at this time.  I encouraged her that once her knee in taken care of to come back to the program to finish her visits.          Comments: Discharge ITP

## 2016-03-16 NOTE — Progress Notes (Signed)
Discharge Summary  Patient Details  Name: Felicia Woods MRN: [id number] Date of Birth: [birth year] Referring Provider:   Flowsheet Row Cardiac Rehab from 11/26/2015 in [facility]  Referring Provider  Quay Burow MD       Number of Visits: 16  Reason for Discharge:  Early Exit:  Personal and knee injury  Smoking History:  History  Smoking Status  . Never Smoker  Smokeless Tobacco  . Not on file    Diagnosis:  ST elevation myocardial infarction (STEMI), unspecified artery (HCC)  S/P PTCA (percutaneous transluminal coronary angioplasty)  ADL UCSD:   Initial Exercise Prescription:     Initial Exercise Prescription - 11/26/15 0900      Date of Initial Exercise RX and Referring Provider   Date 11/26/15   Referring Provider Quay Burow MD     Treadmill   MPH 1.5   Grade 0   Minutes 15     Cybex   Level 10   RPM 25   Minutes 5     T5 Nustep   Level 3   Watts 26   Minutes 15     Intensity   THRR REST +  30   Ratings of Perceived Exertion 11-15   Perceived Dyspnea 0-4     Progression   Progression Continue to progress workloads to maintain intensity without signs/symptoms of physical distress.     Resistance Training   Training Prescription Yes   Weight 2   Reps 10-15      Discharge Exercise Prescription (Final Exercise Prescription Changes):     Exercise Prescription Changes - 02/26/16 1500      Exercise Review   Progression Yes     Response to Exercise   Blood Pressure (Admit) 132/64   Blood Pressure (Exercise) 136/64   Blood Pressure (Exit) 110/78   Heart Rate (Admit) 57 bpm   Heart Rate (Exercise) 135 bpm   Heart Rate (Exit) 90 bpm   Rating of Perceived Exertion (Exercise) 13   Symptoms knee pain   Comments Home Exercise Guidelines given 02/20/16   Duration Progress to 30 minutes of continuous aerobic without signs/symptoms of physical distress   Intensity THRR unchanged     Progression   Progression  Continue to progress workloads to maintain intensity without signs/symptoms of physical distress.   Average METs 2.5     Resistance Training   Training Prescription Yes   Weight --  Pt arrives late and misses weights regularly     Interval Training   Interval Training No     Treadmill   MPH 2.6   Grade 1.5   Minutes 15     NuStep   Level 5   Watts --  3.0 METs   Minutes 15     Elliptical   Level 1   Speed 2.5   Minutes 15   METs 6     Home Exercise Plan   Plans to continue exercise at Home  walking   Frequency Add 3 additional days to program exercise sessions.      Functional Capacity:     6 Minute Walk    Row Name 11/18/15 1458 11/18/15 1503       6 Minute Walk   Phase Initial  -    Distance 770 feet  -    Walk Time 4 minutes  -    # of Rest Breaks 1  -    RPE 13  -    Symptoms  -  Yes (comment)    Comments  - Pt stopped at 4 minutes of walk, stating "chest tightness" which resolved with rest.     Resting HR 94 bpm  -    Resting BP 126/70  -    Max Ex. HR 122 bpm  -    Max Ex. BP 134/72  -       Psychological, QOL, Others - Outcomes: PHQ 2/9: Depression screen [facility] 2/9 11/18/2015 02/10/2015  Decreased Interest 1 0  Down, Depressed, Hopeless 2 0  PHQ - 2 Score 3 0  Altered sleeping 3 -  Tired, decreased energy 3 -  Change in appetite 2 -  Feeling bad or failure about yourself  0 -  Trouble concentrating 0 -  Moving slowly or fidgety/restless 0 -  Suicidal thoughts 0 -  PHQ-9 Score 11 -  Difficult doing work/chores Somewhat difficult -    Quality of Life:     Quality of Life - 11/18/15 1651      Quality of Life Scores   Health/Function Pre 17.3 %   Socioeconomic Pre 15.58 %   Psych/Spiritual Pre 25.71 %   Family Pre 25.2 %   GLOBAL Pre 19.97 %      Personal Goals: Goals established at orientation with interventions provided to work toward goal.     Personal Goals and Risk Factors at Admission - 11/18/15 1220      Core  Components/Risk Factors/Patient Goals on Admission    Weight Management Yes;Obesity   Intervention Weight Management: Develop a combined nutrition and exercise program designed to reach desired caloric intake, while maintaining appropriate intake of nutrient and fiber, sodium and fats, and appropriate energy expenditure required for the weight goal.;Weight Management: Provide education and appropriate resources to help participant work on and attain dietary goals.;Weight Management/Obesity: Establish reasonable short term and long term weight goals.;Obesity: Provide education and appropriate resources to help participant work on and attain dietary goals.   Admit Weight 253 lb (114.8 kg)   Goal Weight: Short Term 245 lb (111.1 kg)   Goal Weight: Long Term 180 lb (81.6 kg)   Expected Outcomes Short Term: Continue to assess and modify interventions until short term weight is achieved;Long Term: Adherence to nutrition and physical activity/exercise program aimed toward attainment of established weight goal   Sedentary Yes   Intervention Provide advice, education, support and counseling about physical activity/exercise needs.;Develop an individualized exercise prescription for aerobic and resistive training based on initial evaluation findings, risk stratification, comorbidities and participant's personal goals.   Expected Outcomes Achievement of increased cardiorespiratory fitness and enhanced flexibility, muscular endurance and strength shown through measurements of functional capacity and personal statement of participant.   Diabetes Yes   Intervention Provide education about signs/symptoms and action to take for hypo/hyperglycemia.;Provide education about proper nutrition, including hydration, and aerobic/resistive exercise prescription along with prescribed medications to achieve blood glucose in normal ranges: Fasting glucose 65-99 mg/dL   Expected Outcomes Short Term: Participant verbalizes  understanding of the signs/symptoms and immediate care of hyper/hypoglycemia, proper foot care and importance of medication, aerobic/resistive exercise and nutrition plan for blood glucose control.;Long Term: Attainment of HbA1C < 7%.   Hypertension Yes   Intervention Provide education on lifestyle modifcations including regular physical activity/exercise, weight management, moderate sodium restriction and increased consumption of fresh fruit, vegetables, and low fat dairy, alcohol moderation, and smoking cessation.;Monitor prescription use compliance.   Expected Outcomes Short Term: Continued assessment and intervention until BP is < 140/58mm HG in hypertensive  participants. < 130/2mm HG in hypertensive participants with diabetes, heart failure or chronic kidney disease.;Long Term: Maintenance of blood pressure at goal levels.   Lipids Yes   Intervention Provide education and support for participant on nutrition & aerobic/resistive exercise along with prescribed medications to achieve LDL 70mg , HDL >40mg .   Expected Outcomes Short Term: Participant states understanding of desired cholesterol values and is compliant with medications prescribed. Participant is following exercise prescription and nutrition guidelines.;Long Term: Cholesterol controlled with medications as prescribed, with individualized exercise RX and with personalized nutrition plan. Value goals: LDL < 70mg , HDL > 40 mg.   Stress Yes  Lives on second floor with steps only to get into apartment.  Symptoms occur everytime when walking up steps.  HAs had MD write note for her to move to first floor apartment and has taken letter to landlord.    Intervention Offer individual and/or small group education and counseling on adjustment to heart disease, stress management and health-related lifestyle change. Teach and support self-help strategies.   Expected Outcomes Short Term: Participant demonstrates changes in health-related behavior,  relaxation and other stress management skills, ability to obtain effective social support, and compliance with psychotropic medications if prescribed.;Long Term: Emotional wellbeing is indicated by absence of clinically significant psychosocial distress or social isolation.       Personal Goals Discharge:     Goals and Risk Factor Review    Row Name 12/19/15 [id number] 12/22/15 1349 01/07/16 0940         Core Components/Risk Factors/Patient Goals Review   Personal Goals Review Weight Management/Obesity;Sedentary;Hypertension;Lipids Sedentary;Weight Management/Obesity;Increase Strength and Stamina;Hypertension;Lipids;Diabetes;Stress Weight Management/Obesity;Increase Strength and Stamina;Diabetes;Hypertension;Lipids;Stress;Sedentary     Review Natoshia got dizzy on the treadmill so stopped and sat down. Blood pressure was 102/70 so given 240 cc of water then it was 102/60. Blood sugar was 158. After 2nd cup of water 240 cc blood pressure was up to 114/70. Avea said she is just very tired since "how do you get any sleep with 4 children sleeping in a bed with me. My son is working 2 jobs so I have them for the summer although they do get to go to day care so Makaylee said she was going back home to go to sleep. Jocelyn Lamer is doing well with exercise.  She comes to class consistently.  Her weights have been averaging between 244-248.  Her blood pressure are in the 120s/60s for resting.  No problems with her lipid medication.  She is not letting stress get the better of her any longer.  She is walk for an hour everyday to and from the mailbox.  He blood sugars have also been going down overall. Rhylen has been doing well in program.  He weight has been steady around 247-248.  She has been doing some walking at home on days she is not coming to program.  Her blood sugars have a steady pattern and she was encouraged to keep a jouranal about it to discuss with her doctor.  They have been high in the morning (200s) and low  after dinner (60s).  She is going to also try adding in more protein to her bed time snack.  Her blood pressure and statin meds are doing well.  Her stress levels are up now that she is moving from the third floor down to the first floor but this should improve after the move.     Expected Outcomes Cont to exercise 3 times a week.  Michalla will continue to make  improvements across the board.  We will continue to monitor for progress. Ashantee will continue to exercise in program and work on weight loss.  We will continue to check on her home blood sugars as well.  Once she is done moving, her overall stress level will come down and she should be able to exercise at home more since she will no longer have to navigate the stairs.        Nutrition & Weight - Outcomes:     Pre Biometrics - 11/18/15 1457      Pre Biometrics   Height 5' 6.25" (1.683 m)   Weight 253 lb (114.8 kg)   Waist Circumference 49 inches   Hip Circumference 55 inches   Waist to Hip Ratio 0.89 %   BMI (Calculated) 40.6       Nutrition:     Nutrition Therapy & Goals - 12/12/15 1110      Nutrition Therapy   Diet Instructed on a meal plan based on dietary guidelines for diabetes and DASH diet principles based on 1600 calories.   Drug/Food Interactions Statins/Certain Fruits   Protein (specify units) 6   Fiber 20 grams   Whole Grain Foods 3 servings   Saturated Fats 11 max. grams   Fruits and Vegetables 5 servings/day   Sodium 1500 grams     Personal Nutrition Goals   Personal Goal #1 Balance meals with protein, 1-2 starch servings, non-starchy vegetables. Can add a small serving of fruit- Refer to handout.   Personal Goal #2 Increase "free/non-starchy" vegetables.   Personal Goal #3 Read labels for saturated fat and trans fat and sodium.     Intervention Plan   Intervention Prescribe, educate and counsel regarding individualized specific dietary modifications aiming towards targeted core components such as weight,  hypertension, lipid management, diabetes, heart failure and other comorbidities.;Nutrition handout(s) given to patient.   Expected Outcomes Short Term Goal: Understand basic principles of dietary content, such as calories, fat, sodium, cholesterol and nutrients.;Long Term Goal: Adherence to prescribed nutrition plan.;Short Term Goal: A plan has been developed with personal nutrition goals set during dietitian appointment.      Nutrition Discharge:     Nutrition Assessments - 12/12/15 1114      Rate Your Plate Scores   Pre Score 52   Pre Score % 58 %      Education Questionnaire Score:     Knowledge Questionnaire Score - 11/18/15 1651      Knowledge Questionnaire Score   Pre Score 14/28      Goals reviewed with patient; copy given to patient.

## 2016-03-16 NOTE — Telephone Encounter (Signed)
Called pt to check on status.  She is still wearing brace and in a lot of pain.  May need total knee replacement.  She was scheduled to graduate on Friday, but requested to be discharged at this time.  I encouraged her that once her knee in taken care of to come back to the program to finish her visits. Alberteen Sam, MA, ACSM RCEP 03/16/2016 11:34 AM

## 2016-03-31 DIAGNOSIS — M25561 Pain in right knee: Secondary | ICD-10-CM | POA: Diagnosis not present

## 2016-04-05 DIAGNOSIS — E119 Type 2 diabetes mellitus without complications: Secondary | ICD-10-CM | POA: Diagnosis not present

## 2016-04-05 DIAGNOSIS — Z0001 Encounter for general adult medical examination with abnormal findings: Secondary | ICD-10-CM | POA: Diagnosis not present

## 2016-04-05 DIAGNOSIS — I10 Essential (primary) hypertension: Secondary | ICD-10-CM | POA: Diagnosis not present

## 2016-04-05 DIAGNOSIS — I2584 Coronary atherosclerosis due to calcified coronary lesion: Secondary | ICD-10-CM | POA: Diagnosis not present

## 2016-04-05 DIAGNOSIS — J4540 Moderate persistent asthma, uncomplicated: Secondary | ICD-10-CM | POA: Diagnosis not present

## 2016-04-05 DIAGNOSIS — M25561 Pain in right knee: Secondary | ICD-10-CM | POA: Diagnosis not present

## 2016-04-08 DIAGNOSIS — Z1231 Encounter for screening mammogram for malignant neoplasm of breast: Secondary | ICD-10-CM | POA: Diagnosis not present

## 2016-04-22 DIAGNOSIS — M25561 Pain in right knee: Secondary | ICD-10-CM | POA: Diagnosis not present

## 2016-05-07 DIAGNOSIS — I10 Essential (primary) hypertension: Secondary | ICD-10-CM | POA: Diagnosis not present

## 2016-05-07 DIAGNOSIS — E119 Type 2 diabetes mellitus without complications: Secondary | ICD-10-CM | POA: Diagnosis not present

## 2016-05-07 DIAGNOSIS — G4700 Insomnia, unspecified: Secondary | ICD-10-CM | POA: Diagnosis not present

## 2016-05-07 DIAGNOSIS — M25561 Pain in right knee: Secondary | ICD-10-CM | POA: Diagnosis not present

## 2016-05-07 DIAGNOSIS — M545 Low back pain: Secondary | ICD-10-CM | POA: Diagnosis not present

## 2016-05-07 DIAGNOSIS — J4540 Moderate persistent asthma, uncomplicated: Secondary | ICD-10-CM | POA: Diagnosis not present

## 2016-05-13 DIAGNOSIS — E669 Obesity, unspecified: Secondary | ICD-10-CM | POA: Insufficient documentation

## 2016-05-13 DIAGNOSIS — I25708 Atherosclerosis of coronary artery bypass graft(s), unspecified, with other forms of angina pectoris: Secondary | ICD-10-CM | POA: Diagnosis not present

## 2016-05-13 DIAGNOSIS — E782 Mixed hyperlipidemia: Secondary | ICD-10-CM | POA: Diagnosis not present

## 2016-05-13 DIAGNOSIS — E66812 Obesity, class 2: Secondary | ICD-10-CM | POA: Insufficient documentation

## 2016-05-21 DIAGNOSIS — Z23 Encounter for immunization: Secondary | ICD-10-CM | POA: Diagnosis not present

## 2016-06-07 DIAGNOSIS — N9489 Other specified conditions associated with female genital organs and menstrual cycle: Secondary | ICD-10-CM | POA: Diagnosis not present

## 2016-06-16 DIAGNOSIS — R51 Headache: Secondary | ICD-10-CM | POA: Diagnosis not present

## 2016-08-10 DIAGNOSIS — G43719 Chronic migraine without aura, intractable, without status migrainosus: Secondary | ICD-10-CM | POA: Diagnosis not present

## 2016-08-17 DIAGNOSIS — M791 Myalgia: Secondary | ICD-10-CM | POA: Diagnosis not present

## 2016-08-17 DIAGNOSIS — G518 Other disorders of facial nerve: Secondary | ICD-10-CM | POA: Diagnosis not present

## 2016-08-17 DIAGNOSIS — R51 Headache: Secondary | ICD-10-CM | POA: Diagnosis not present

## 2016-08-17 DIAGNOSIS — G43719 Chronic migraine without aura, intractable, without status migrainosus: Secondary | ICD-10-CM | POA: Diagnosis not present

## 2016-08-17 DIAGNOSIS — M542 Cervicalgia: Secondary | ICD-10-CM | POA: Diagnosis not present

## 2016-08-30 DIAGNOSIS — N3281 Overactive bladder: Secondary | ICD-10-CM | POA: Diagnosis not present

## 2016-08-30 DIAGNOSIS — R0602 Shortness of breath: Secondary | ICD-10-CM | POA: Diagnosis not present

## 2016-08-30 DIAGNOSIS — E119 Type 2 diabetes mellitus without complications: Secondary | ICD-10-CM | POA: Diagnosis not present

## 2016-08-30 DIAGNOSIS — I10 Essential (primary) hypertension: Secondary | ICD-10-CM | POA: Diagnosis not present

## 2016-08-30 DIAGNOSIS — J4540 Moderate persistent asthma, uncomplicated: Secondary | ICD-10-CM | POA: Diagnosis not present

## 2016-08-30 DIAGNOSIS — I2584 Coronary atherosclerosis due to calcified coronary lesion: Secondary | ICD-10-CM | POA: Diagnosis not present

## 2016-09-01 DIAGNOSIS — L851 Acquired keratosis [keratoderma] palmaris et plantaris: Secondary | ICD-10-CM | POA: Diagnosis not present

## 2016-09-01 DIAGNOSIS — B351 Tinea unguium: Secondary | ICD-10-CM | POA: Diagnosis not present

## 2016-09-01 DIAGNOSIS — E119 Type 2 diabetes mellitus without complications: Secondary | ICD-10-CM | POA: Diagnosis not present

## 2016-09-21 DIAGNOSIS — M542 Cervicalgia: Secondary | ICD-10-CM | POA: Diagnosis not present

## 2016-09-21 DIAGNOSIS — M791 Myalgia: Secondary | ICD-10-CM | POA: Diagnosis not present

## 2016-09-21 DIAGNOSIS — G518 Other disorders of facial nerve: Secondary | ICD-10-CM | POA: Diagnosis not present

## 2016-09-21 DIAGNOSIS — R51 Headache: Secondary | ICD-10-CM | POA: Diagnosis not present

## 2016-09-21 DIAGNOSIS — G43719 Chronic migraine without aura, intractable, without status migrainosus: Secondary | ICD-10-CM | POA: Diagnosis not present

## 2016-10-19 DIAGNOSIS — M79674 Pain in right toe(s): Secondary | ICD-10-CM | POA: Diagnosis not present

## 2016-10-19 DIAGNOSIS — M7989 Other specified soft tissue disorders: Secondary | ICD-10-CM | POA: Diagnosis not present

## 2016-10-28 ENCOUNTER — Other Ambulatory Visit: Payer: Self-pay | Admitting: Physician Assistant

## 2016-10-28 DIAGNOSIS — M79671 Pain in right foot: Secondary | ICD-10-CM | POA: Diagnosis not present

## 2016-10-28 DIAGNOSIS — I10 Essential (primary) hypertension: Secondary | ICD-10-CM | POA: Diagnosis not present

## 2016-10-28 DIAGNOSIS — J4540 Moderate persistent asthma, uncomplicated: Secondary | ICD-10-CM | POA: Diagnosis not present

## 2016-10-28 DIAGNOSIS — E119 Type 2 diabetes mellitus without complications: Secondary | ICD-10-CM | POA: Diagnosis not present

## 2016-10-28 DIAGNOSIS — Z634 Disappearance and death of family member: Secondary | ICD-10-CM | POA: Diagnosis not present

## 2016-10-28 NOTE — Telephone Encounter (Signed)
Brilinta refill refused - defer to current cardiologist @ [facility]

## 2016-10-28 NOTE — Telephone Encounter (Signed)
This is a [facility] pt, Fox Chase, PA, saw pt.

## 2016-11-01 DIAGNOSIS — M79672 Pain in left foot: Secondary | ICD-10-CM | POA: Diagnosis not present

## 2016-11-01 DIAGNOSIS — M722 Plantar fascial fibromatosis: Secondary | ICD-10-CM | POA: Diagnosis not present

## 2016-11-01 DIAGNOSIS — S9031XA Contusion of right foot, initial encounter: Secondary | ICD-10-CM | POA: Diagnosis not present

## 2016-11-01 DIAGNOSIS — M7731 Calcaneal spur, right foot: Secondary | ICD-10-CM | POA: Diagnosis not present

## 2016-11-01 DIAGNOSIS — Z6841 Body Mass Index (BMI) 40.0 and over, adult: Secondary | ICD-10-CM | POA: Diagnosis not present

## 2016-11-01 DIAGNOSIS — S90121A Contusion of right lesser toe(s) without damage to nail, initial encounter: Secondary | ICD-10-CM | POA: Diagnosis not present

## 2016-11-03 ENCOUNTER — Other Ambulatory Visit: Payer: Self-pay | Admitting: Physician Assistant

## 2016-11-03 NOTE — Telephone Encounter (Signed)
REFILL 

## 2016-11-12 ENCOUNTER — Encounter: Payer: Self-pay | Admitting: Dietician

## 2016-11-12 ENCOUNTER — Encounter: Payer: Medicare Other | Attending: Internal Medicine | Admitting: Dietician

## 2016-11-12 VITALS — Ht 67.0 in | Wt 254.5 lb

## 2016-11-12 DIAGNOSIS — E119 Type 2 diabetes mellitus without complications: Secondary | ICD-10-CM | POA: Insufficient documentation

## 2016-11-12 DIAGNOSIS — I10 Essential (primary) hypertension: Secondary | ICD-10-CM | POA: Insufficient documentation

## 2016-11-12 DIAGNOSIS — Z6839 Body mass index (BMI) 39.0-39.9, adult: Secondary | ICD-10-CM

## 2016-11-12 DIAGNOSIS — Z713 Dietary counseling and surveillance: Secondary | ICD-10-CM | POA: Insufficient documentation

## 2016-11-12 DIAGNOSIS — M179 Osteoarthritis of knee, unspecified: Secondary | ICD-10-CM | POA: Insufficient documentation

## 2016-11-12 DIAGNOSIS — M1710 Unilateral primary osteoarthritis, unspecified knee: Secondary | ICD-10-CM | POA: Insufficient documentation

## 2016-11-12 NOTE — Patient Instructions (Signed)
   Start including lunch daily, can be a small light meal such as chicken salad and fruit with 5-10 crackers, a sandwich with fruit, or other options on menu list.   Keep up some regular walking to burn up calories and control blood sugar.   Choose sugar free or no added sugar canned fruit, and keep to 1/2 cup (at least 1/2 can or less) portions.  Eating a lunch and maybe a small afternoon snack will help control appetite at supper so portions will be easier to control.   You can also try using a smaller plate, eating more slowly, making sure you are relaxed when eating supper to avoid overeating.

## 2016-11-12 NOTE — Progress Notes (Signed)
Medical Nutrition Therapy: Visit start time: 0900  end time: 1000  Assessment:  Diagnosis: diabetes, obesity Past medical history: HTN, HLD, CHF, GERD Psychosocial issues/ stress concerns: depression symptoms due to recent death of her brother Preferred learning method:  . Auditory . Visual . Hands-on  Current weight: 254.5lbs  Height: 5'7" Medications, supplements: reconciled list in medical record  Progress and evaluation: Patient reports increased eating recently since death of her brother, who was buried 3 days ago. She had lost to 240lbs prior to the death of her father 6 months ago, but regained after his death. She would like help with a basic meal plan to help with weight and blood sugar control.     Physical activity: some walking up to 1-2 hours, 2-3 times a week  Dietary Intake:  Usual eating pattern includes 2 meals and 0 snacks per day. Dining out frequency: 3 meals per week.  Breakfast: 9-9:30am cereal or oatmeal Snack: none Lunch: usually none Snack: none Supper: sometimes 9-9:30pm 1/63-AGTXM lasagna, garlic bread, cabbage; chicken with broccoli; variety of foods Snack: none Beverages: crystal light 4-5 bottles (16oz) daily  Nutrition Care Education: Topics covered: diabetes, weight control Basic nutrition: basic food groups, appropriate nutrient balance, appropriate meal and snack schedule, general nutrition guidelines    Weight control: benefits of weight control, determining reasonable weight goal, behavioral changes for weight loss for portion and appetite control; basic meal planning for 1400kcal intake with reduced carb intake; role of exercise in both weight control and BG control.  Advanced nutrition: food label reading Diabetes: appropriate meal and snack schedule, appropriate carb intake and balance, appropriate food portions Hypertension:  importance of controlling BP, identifying high sodium foods, identifying food sources of potassium, magnesium Other  lifestyle changes:  Effects of stress, depression on blood sugars and weight.  Nutritional Diagnosis:  [place]-2.2 Altered nutrition-related laboratory As related to hyperglycemia from diabetes.  As evidenced by lab report, patient records. [place]-3.3 Overweight/obesity As related to excess calories, inactivity.  As evidenced by BMI 39.6, patient report.  Intervention: Instruction as noted above.   Set goals with input from patient.    She is motivated to work on weight loss in preparation for her wedding scheduled in 1 year.   Education Materials given:  . General diet guidelines for Diabetes . Plate Planner . Food lists/ Planning A Balanced Meal . Sample meal pattern/ menus . Goals/ instructions  Learner/ who was taught:  . Patient   Level of understanding: Marland Kitchen Verbalizes/ demonstrates competency  Demonstrated degree of understanding via:   Teach back Learning barriers: . None  Willingness to learn/ readiness for change: . Acceptance, ready for change  Monitoring and Evaluation:  Dietary intake, exercise, BG control, and body weight      follow up: 12/24/16

## 2016-11-30 ENCOUNTER — Other Ambulatory Visit: Payer: Self-pay | Admitting: Physician Assistant

## 2016-11-30 DIAGNOSIS — M722 Plantar fascial fibromatosis: Secondary | ICD-10-CM | POA: Diagnosis not present

## 2016-12-01 DIAGNOSIS — R200 Anesthesia of skin: Secondary | ICD-10-CM | POA: Diagnosis not present

## 2016-12-01 DIAGNOSIS — R202 Paresthesia of skin: Secondary | ICD-10-CM

## 2016-12-01 DIAGNOSIS — G44221 Chronic tension-type headache, intractable: Secondary | ICD-10-CM | POA: Insufficient documentation

## 2016-12-03 DIAGNOSIS — I2510 Atherosclerotic heart disease of native coronary artery without angina pectoris: Secondary | ICD-10-CM | POA: Diagnosis not present

## 2016-12-03 DIAGNOSIS — I10 Essential (primary) hypertension: Secondary | ICD-10-CM | POA: Diagnosis not present

## 2016-12-03 DIAGNOSIS — E119 Type 2 diabetes mellitus without complications: Secondary | ICD-10-CM | POA: Diagnosis not present

## 2016-12-03 DIAGNOSIS — J4540 Moderate persistent asthma, uncomplicated: Secondary | ICD-10-CM | POA: Diagnosis not present

## 2016-12-03 DIAGNOSIS — Z634 Disappearance and death of family member: Secondary | ICD-10-CM | POA: Diagnosis not present

## 2016-12-07 DIAGNOSIS — I5033 Acute on chronic diastolic (congestive) heart failure: Secondary | ICD-10-CM | POA: Insufficient documentation

## 2016-12-07 DIAGNOSIS — I25708 Atherosclerosis of coronary artery bypass graft(s), unspecified, with other forms of angina pectoris: Secondary | ICD-10-CM | POA: Diagnosis not present

## 2016-12-07 DIAGNOSIS — R0602 Shortness of breath: Secondary | ICD-10-CM | POA: Diagnosis not present

## 2016-12-07 DIAGNOSIS — I10 Essential (primary) hypertension: Secondary | ICD-10-CM | POA: Diagnosis not present

## 2016-12-24 ENCOUNTER — Encounter: Payer: Self-pay | Admitting: Dietician

## 2016-12-24 ENCOUNTER — Encounter: Payer: Medicare Other | Attending: Internal Medicine | Admitting: Dietician

## 2016-12-24 VITALS — Ht 67.0 in | Wt 250.4 lb

## 2016-12-24 DIAGNOSIS — I10 Essential (primary) hypertension: Secondary | ICD-10-CM | POA: Insufficient documentation

## 2016-12-24 DIAGNOSIS — Z6839 Body mass index (BMI) 39.0-39.9, adult: Secondary | ICD-10-CM

## 2016-12-24 DIAGNOSIS — Z713 Dietary counseling and surveillance: Secondary | ICD-10-CM | POA: Diagnosis not present

## 2016-12-24 DIAGNOSIS — E119 Type 2 diabetes mellitus without complications: Secondary | ICD-10-CM

## 2016-12-24 NOTE — Progress Notes (Signed)
Medical Nutrition Therapy: Visit start time: 0900  end time: 0930  Assessment:  Diagnosis: diabetes, obesity Medical history changes: no changes per patient Psychosocial issues/ stress concerns: increased stress in past week due to car repairs/ transportation and death of a second brother.   Current weight: 250.4lbs  Height: 5'7" Medications, supplement changes: no changes per patient since 11/12/16 Progress and evaluation: wieght loss of 4.4lbs since 11/12/16.   Physical activity: increased on-the-job walking, twice as much as in the past, so no additional exercise   Dietary Intake:  Usual eating pattern includes 3 meals and 0 snacks per day. Dining out frequency: 2-3 meals per week.  Breakfast: 1c oatmeal, cream of wheat, or dry cereal, and fruit, 1/2 glass orange juice Snack: none Lunch: grilled chicken and fruit, or chicken or tuna salad with crackers.  Snack: none Supper: often just fruit in evenings due to hot weather and effects of medications.  Snack: none Beverages: water with crystal light  Nutrition Care Education: Topics covered: weight management, diabetes Basic nutrition: appropriate nutrient balance, appropriate meal and snack schedule    Weight control: benefits of weight control including improvement in arthritis pain; importance of adequate caloric intake; appropriate food choices, allowance of occasional treats to promote long-term adherence. Diabetes: appropriate meal and snack schedule, appropriate carb intake and balance; advised adding protein to fruit in evenings for better effects on BGs.   Nutritional Diagnosis:  [place]-2.2 Altered nutrition-related laboratory As related to diabetes.  As evidenced by lab reports, patient report. [place]-3.3 Overweight/obesity As related to history of excess calories and inactivity.  As evidenced by patient report.  Intervention: Discussion as noted above.   Commended patient for changes made despite ongoing stressors in her  life.   Updated goals with input from patient.   Education Materials given:  Marland Kitchen Goals/ instructions  Learner/ who was taught:  . Patient   Level of understanding: Marland Kitchen Verbalizes/ demonstrates competency  Demonstrated degree of understanding via:   Teach back Learning barriers: . None  Willingness to learn/ readiness for change: . Eager, change in progress  Monitoring and Evaluation:  Dietary intake, exercise, BG control, and body weight      follow up: 02/01/17

## 2016-12-24 NOTE — Patient Instructions (Signed)
   Make sure to include a low fat protein food with fruit in the evenings, such as eggs, low fat cheese, sliced [place] or chicken, tuna or chicken salad.   Keep up your pattern of eating 3 meals each day, great job!  Great job limiting candy and sweets also!

## 2016-12-31 DIAGNOSIS — I5033 Acute on chronic diastolic (congestive) heart failure: Secondary | ICD-10-CM | POA: Diagnosis not present

## 2016-12-31 DIAGNOSIS — R0602 Shortness of breath: Secondary | ICD-10-CM | POA: Diagnosis not present

## 2017-02-01 ENCOUNTER — Ambulatory Visit: Payer: Medicare Other | Admitting: Dietician

## 2017-02-10 DIAGNOSIS — R0789 Other chest pain: Secondary | ICD-10-CM | POA: Diagnosis not present

## 2017-02-10 DIAGNOSIS — Z01818 Encounter for other preprocedural examination: Secondary | ICD-10-CM | POA: Diagnosis not present

## 2017-02-15 DIAGNOSIS — I200 Unstable angina: Secondary | ICD-10-CM | POA: Diagnosis not present

## 2017-02-16 ENCOUNTER — Other Ambulatory Visit: Payer: Self-pay

## 2017-02-16 ENCOUNTER — Encounter
Admission: RE | Disposition: A | Discharge status: Home / Self Care | Payer: Self-pay | Source: Ambulatory Visit | Attending: Internal Medicine

## 2017-02-16 ENCOUNTER — Encounter: Payer: Self-pay | Admitting: *Deleted

## 2017-02-16 ENCOUNTER — Observation Stay
Admission: RE | Admit: 2017-02-16 | Discharge: 2017-02-17 | Disposition: A | Discharge status: Home / Self Care | Payer: Medicare Other | Source: Ambulatory Visit | Attending: Internal Medicine | Admitting: Internal Medicine

## 2017-02-16 DIAGNOSIS — I10 Essential (primary) hypertension: Secondary | ICD-10-CM | POA: Insufficient documentation

## 2017-02-16 DIAGNOSIS — E7800 Pure hypercholesterolemia, unspecified: Secondary | ICD-10-CM | POA: Diagnosis not present

## 2017-02-16 DIAGNOSIS — Z7984 Long term (current) use of oral hypoglycemic drugs: Secondary | ICD-10-CM | POA: Diagnosis not present

## 2017-02-16 DIAGNOSIS — I25790 Atherosclerosis of other coronary artery bypass graft(s) with unstable angina pectoris: Secondary | ICD-10-CM | POA: Diagnosis not present

## 2017-02-16 DIAGNOSIS — Z79899 Other long term (current) drug therapy: Secondary | ICD-10-CM | POA: Diagnosis not present

## 2017-02-16 DIAGNOSIS — Z955 Presence of coronary angioplasty implant and graft: Secondary | ICD-10-CM

## 2017-02-16 DIAGNOSIS — I25709 Atherosclerosis of coronary artery bypass graft(s), unspecified, with unspecified angina pectoris: Secondary | ICD-10-CM | POA: Diagnosis not present

## 2017-02-16 DIAGNOSIS — I25119 Atherosclerotic heart disease of native coronary artery with unspecified angina pectoris: Principal | ICD-10-CM | POA: Insufficient documentation

## 2017-02-16 DIAGNOSIS — Z7982 Long term (current) use of aspirin: Secondary | ICD-10-CM | POA: Insufficient documentation

## 2017-02-16 DIAGNOSIS — Z4889 Encounter for other specified surgical aftercare: Secondary | ICD-10-CM | POA: Diagnosis not present

## 2017-02-16 DIAGNOSIS — Z951 Presence of aortocoronary bypass graft: Secondary | ICD-10-CM | POA: Insufficient documentation

## 2017-02-16 HISTORY — PX: LEFT HEART CATH AND CORONARY ANGIOGRAPHY: CATH118249

## 2017-02-16 HISTORY — PX: CORONARY STENT INTERVENTION: CATH118234

## 2017-02-16 LAB — GLUCOSE, CAPILLARY: GLUCOSE-CAPILLARY: 118 mg/dL — AB (ref 65–99)

## 2017-02-16 LAB — POCT ACTIVATED CLOTTING TIME: ACTIVATED CLOTTING TIME: 417 s

## 2017-02-16 SURGERY — LEFT HEART CATH AND CORONARY ANGIOGRAPHY
Anesthesia: Moderate Sedation

## 2017-02-16 MED ORDER — IOPAMIDOL (ISOVUE-300) INJECTION 61%
INTRAVENOUS | Status: DC | PRN
  Administered 2017-02-16: 200 mL via INTRA_ARTERIAL
  Administered 2017-02-16: 120 mL via INTRA_ARTERIAL

## 2017-02-16 MED ORDER — LABETALOL HCL 5 MG/ML IV SOLN: 10.0000 mg | INTRAVENOUS | Status: AC | PRN

## 2017-02-16 MED ORDER — SODIUM CHLORIDE 0.9 % IV SOLN: 250.0000 mL | INTRAVENOUS | Status: DC | PRN

## 2017-02-16 MED ORDER — MIDAZOLAM HCL 2 MG/2ML IJ SOLN
INTRAMUSCULAR | Status: DC | PRN
  Administered 2017-02-16: 1 mg via INTRAVENOUS

## 2017-02-16 MED ORDER — ASPIRIN 81 MG PO CHEW
CHEWABLE_TABLET | ORAL | Status: DC | PRN
  Administered 2017-02-16: 324 mg via ORAL

## 2017-02-16 MED ORDER — BIVALIRUDIN TRIFLUOROACETATE 250 MG IV SOLR
INTRAVENOUS | Status: AC
  Filled 2017-02-16: qty 250

## 2017-02-16 MED ORDER — SODIUM CHLORIDE 0.9 % IV SOLN
0.2500 mg/kg/h | INTRAVENOUS | Status: AC
  Filled 2017-02-16: qty 250

## 2017-02-16 MED ORDER — ONDANSETRON HCL 4 MG/2ML IJ SOLN
4.0000 mg | Freq: Four times a day (QID) | INTRAMUSCULAR | Status: DC | PRN
Start: 2017-02-16 — End: 2017-02-17

## 2017-02-16 MED ORDER — ASPIRIN 81 MG PO CHEW: 81.0000 mg | CHEWABLE_TABLET | ORAL | Status: DC

## 2017-02-16 MED ORDER — SODIUM CHLORIDE 0.9 % WEIGHT BASED INFUSION
3.0000 mL/kg/h | INTRAVENOUS | Status: DC
  Administered 2017-02-16: 3 mL/kg/h via INTRAVENOUS

## 2017-02-16 MED ORDER — FENTANYL CITRATE (PF) 100 MCG/2ML IJ SOLN
INTRAMUSCULAR | Status: DC | PRN
  Administered 2017-02-16 (×2): 25 ug via INTRAVENOUS

## 2017-02-16 MED ORDER — ATORVASTATIN CALCIUM 20 MG PO TABS
80.0000 mg | ORAL_TABLET | Freq: Every day | ORAL | Status: DC
  Administered 2017-02-16: 80 mg via ORAL
  Filled 2017-02-16: qty 4

## 2017-02-16 MED ORDER — FENTANYL CITRATE (PF) 100 MCG/2ML IJ SOLN
INTRAMUSCULAR | Status: AC
  Filled 2017-02-16: qty 2

## 2017-02-16 MED ORDER — ASPIRIN 81 MG PO CHEW
81.0000 mg | CHEWABLE_TABLET | Freq: Every day | ORAL | Status: DC
  Administered 2017-02-17: 81 mg via ORAL
  Filled 2017-02-16: qty 1

## 2017-02-16 MED ORDER — AMLODIPINE BESYLATE 10 MG PO TABS
10.0000 mg | ORAL_TABLET | Freq: Every day | ORAL | Status: DC
  Administered 2017-02-16: 10 mg via ORAL
  Filled 2017-02-16: qty 1

## 2017-02-16 MED ORDER — SODIUM CHLORIDE 0.9 % IV SOLN
INTRAVENOUS | Status: AC | PRN
  Administered 2017-02-16: 250 mL via INTRAVENOUS

## 2017-02-16 MED ORDER — FENTANYL CITRATE (PF) 100 MCG/2ML IJ SOLN
INTRAMUSCULAR | Status: DC | PRN
  Administered 2017-02-16: 25 ug via INTRAVENOUS

## 2017-02-16 MED ORDER — OXYBUTYNIN CHLORIDE 5 MG PO TABS
5.0000 mg | ORAL_TABLET | Freq: Every day | ORAL | Status: DC
  Administered 2017-02-16: 5 mg via ORAL
  Filled 2017-02-16: qty 1

## 2017-02-16 MED ORDER — LIDOCAINE HCL (PF) 1 % IJ SOLN
INTRAMUSCULAR | Status: AC
  Filled 2017-02-16: qty 30

## 2017-02-16 MED ORDER — ACETAMINOPHEN 325 MG PO TABS
650.0000 mg | ORAL_TABLET | ORAL | Status: DC | PRN
  Administered 2017-02-16: 650 mg via ORAL

## 2017-02-16 MED ORDER — BIVALIRUDIN TRIFLUOROACETATE 250 MG IV SOLR
INTRAVENOUS | Status: AC | PRN
  Administered 2017-02-16: 1.75 mg/kg/h via INTRAVENOUS

## 2017-02-16 MED ORDER — HYDROCODONE-ACETAMINOPHEN 5-325 MG PO TABS
1.0000 | ORAL_TABLET | Freq: Four times a day (QID) | ORAL | Status: DC | PRN
  Administered 2017-02-16: 1 via ORAL
  Filled 2017-02-16: qty 1

## 2017-02-16 MED ORDER — SODIUM CHLORIDE 0.9% FLUSH: 3.0000 mL | Freq: Two times a day (BID) | INTRAVENOUS | Status: DC

## 2017-02-16 MED ORDER — ZOLPIDEM TARTRATE 5 MG PO TABS
5.0000 mg | ORAL_TABLET | Freq: Every day | ORAL | Status: DC
  Administered 2017-02-16: 5 mg via ORAL
  Filled 2017-02-16: qty 1

## 2017-02-16 MED ORDER — BIVALIRUDIN BOLUS VIA INFUSION - CUPID
INTRAVENOUS | Status: DC | PRN
  Administered 2017-02-16: 85.05 mg via INTRAVENOUS

## 2017-02-16 MED ORDER — CLOPIDOGREL BISULFATE 300 MG PO TABS
ORAL_TABLET | ORAL | Status: AC
  Filled 2017-02-16: qty 1

## 2017-02-16 MED ORDER — MIDAZOLAM HCL 2 MG/2ML IJ SOLN
INTRAMUSCULAR | Status: AC
  Filled 2017-02-16: qty 2

## 2017-02-16 MED ORDER — SODIUM CHLORIDE 0.9 % WEIGHT BASED INFUSION: 1.0000 mL/kg/h | INTRAVENOUS | Status: AC

## 2017-02-16 MED ORDER — SODIUM CHLORIDE 0.9 % WEIGHT BASED INFUSION: 1.0000 mL/kg/h | INTRAVENOUS | Status: DC

## 2017-02-16 MED ORDER — NITROGLYCERIN 5 MG/ML IV SOLN
INTRAVENOUS | Status: AC
  Filled 2017-02-16: qty 10

## 2017-02-16 MED ORDER — ACETAMINOPHEN 325 MG PO TABS
ORAL_TABLET | ORAL | Status: AC
  Administered 2017-02-16: 12:00:00
  Filled 2017-02-16: qty 2

## 2017-02-16 MED ORDER — CLOPIDOGREL BISULFATE 75 MG PO TABS
ORAL_TABLET | ORAL | Status: DC | PRN
  Administered 2017-02-16: 300 mg via ORAL

## 2017-02-16 MED ORDER — CLOPIDOGREL BISULFATE 75 MG PO TABS
75.0000 mg | ORAL_TABLET | Freq: Every day | ORAL | Status: DC
  Administered 2017-02-17: 75 mg via ORAL
  Filled 2017-02-16: qty 1

## 2017-02-16 MED ORDER — HYDRALAZINE HCL 20 MG/ML IJ SOLN: 5.0000 mg | INTRAMUSCULAR | Status: AC | PRN

## 2017-02-16 MED ORDER — SODIUM CHLORIDE 0.9% FLUSH: 3.0000 mL | INTRAVENOUS | Status: DC | PRN

## 2017-02-16 MED ORDER — MONTELUKAST SODIUM 10 MG PO TABS
10.0000 mg | ORAL_TABLET | Freq: Every day | ORAL | Status: DC
  Administered 2017-02-16: 10 mg via ORAL
  Filled 2017-02-16: qty 1

## 2017-02-16 MED ORDER — ASPIRIN 81 MG PO CHEW
CHEWABLE_TABLET | ORAL | Status: AC
  Filled 2017-02-16: qty 4

## 2017-02-16 MED ORDER — SERTRALINE HCL 50 MG PO TABS
100.0000 mg | ORAL_TABLET | Freq: Every day | ORAL | Status: DC
  Administered 2017-02-16: 100 mg via ORAL
  Filled 2017-02-16: qty 2

## 2017-02-16 MED ORDER — LOSARTAN POTASSIUM 50 MG PO TABS
50.0000 mg | ORAL_TABLET | Freq: Every day | ORAL | Status: DC
  Administered 2017-02-16: 50 mg via ORAL
  Filled 2017-02-16: qty 1

## 2017-02-16 SURGICAL SUPPLY — 19 items
BALLN TREK RX 2.5X15 (BALLOONS) ×3
BALLOON TREK RX 2.5X15 (BALLOONS) ×2 IMPLANT
CATH 5FR JL4 DIAGNOSTIC (CATHETERS) ×3 IMPLANT
CATH 5FR PIGTAIL DIAGNOSTIC (CATHETERS) ×3 IMPLANT
CATH INFINITI 5 FR IM (CATHETERS) ×3 IMPLANT
CATH INFINITI 5 FR LCB (CATHETERS) ×3 IMPLANT
CATH INFINITI 5 FR RCB (CATHETERS) ×3 IMPLANT
CATH INFINITI JR4 5F (CATHETERS) ×3 IMPLANT
CATH VISTA GUIDE 6FR LCB SH (CATHETERS) ×3 IMPLANT
DEVICE CLOSURE MYNXGRIP 6/7F (Vascular Products) ×3 IMPLANT
DEVICE INFLAT 30 PLUS (MISCELLANEOUS) ×3 IMPLANT
GUIDEWIRE 3MM J TIP .035 145 (WIRE) ×3 IMPLANT
KIT MANI 3VAL PERCEP (MISCELLANEOUS) ×3 IMPLANT
NEEDLE PERC 18GX7CM (NEEDLE) ×3 IMPLANT
PACK CARDIAC CATH (CUSTOM PROCEDURE TRAY) ×3 IMPLANT
SHEATH AVANTI 6FR X 11CM (SHEATH) ×3 IMPLANT
SHEATH PINNACLE 5F 10CM (SHEATH) ×3 IMPLANT
STENT XIENCE ALPINE RX 2.5X18 (Permanent Stent) ×3 IMPLANT
WIRE G HI TQ BMW 190 (WIRE) ×3 IMPLANT

## 2017-02-16 NOTE — Progress Notes (Signed)
md paged for orders,unable to begin prep.

## 2017-02-16 NOTE — Progress Notes (Signed)
md paged for orders.

## 2017-02-16 NOTE — Progress Notes (Signed)
MD Notified. Pt complains of pain 10/10 in groin site not relieved with prn tylenol given in another department. MD orders vicodin 5/325 every 8 hours prn. I will continue to assess.

## 2017-02-16 NOTE — Plan of Care (Signed)
Problem: Fluid Volume: Goal: Ability to maintain a balanced intake and output will improve Outcome: Progressing Was eager to order food as soon as arrived on unit from special procedures recovery room. Will continue to monitor. Wenda Low [facility]

## 2017-02-17 DIAGNOSIS — Z951 Presence of aortocoronary bypass graft: Secondary | ICD-10-CM | POA: Diagnosis not present

## 2017-02-17 DIAGNOSIS — Z955 Presence of coronary angioplasty implant and graft: Secondary | ICD-10-CM | POA: Diagnosis not present

## 2017-02-17 DIAGNOSIS — E7800 Pure hypercholesterolemia, unspecified: Secondary | ICD-10-CM | POA: Diagnosis not present

## 2017-02-17 DIAGNOSIS — I10 Essential (primary) hypertension: Secondary | ICD-10-CM | POA: Diagnosis not present

## 2017-02-17 DIAGNOSIS — I25119 Atherosclerotic heart disease of native coronary artery with unspecified angina pectoris: Secondary | ICD-10-CM | POA: Diagnosis not present

## 2017-02-17 DIAGNOSIS — I25709 Atherosclerosis of coronary artery bypass graft(s), unspecified, with unspecified angina pectoris: Secondary | ICD-10-CM | POA: Diagnosis not present

## 2017-02-17 DIAGNOSIS — I25790 Atherosclerosis of other coronary artery bypass graft(s) with unstable angina pectoris: Secondary | ICD-10-CM | POA: Diagnosis not present

## 2017-02-17 NOTE — Progress Notes (Signed)
IV and tele removed from patient. Discharge instructions given to patient along with cath site aftercare instructions. Patient verbalized understanding. No distress at this time. Husband will be transporting patient home.

## 2017-02-17 NOTE — Plan of Care (Signed)
Problem: Pain Managment: Goal: General experience of comfort will improve Outcome: Completed/Met Date Met: 02/17/17 No complaints of pain this shift, will continue to monitor.  Problem: Activity: Goal: Risk for activity intolerance will decrease Outcome: Completed/Met Date Met: 02/17/17 Pt now up ad lib with no help, tolerating well.   Problem: Cardiovascular: Goal: Vascular access site(s) Level 0-1 will be maintained Outcome: Completed/Met Date Met: 02/17/17 Right groin cath site, level 0, no bleeding/bruising & is soft.

## 2017-02-17 NOTE — Discharge Summary (Signed)
[facility] Cardiology Discharge Summary  Patient ID: Felicia Woods MRN: [id number] DOB/AGE: 03-18-1956 61 y.o.  Admit date: 02/16/2017 Discharge date: 02/17/2017  Primary Discharge Diagnosis: Coronary artery bypass graft with angina I25.798 Secondary Discharge Diagnosis high blood pressure and high cholesterol  Significant Diagnostic Studies: Cardiac cath with left ventricular angiogram and selective coronary injection as well as PCI and stent placement of graft to ramus.  Hospital Course: The patient was admitted to specials for cardiac cath with selective coronary angiogram after full consent, risk and benefits explained, and time out called with all approprate details voiced and discussed. The patient has had progressive canadian class 4 angina without normal risk stress test consistent with ischemic chest pain and or anginal equivalent with coronary artery risk factors including high blood pressure and high cholesterol. The procedure was performed without complication and it revealed normal left ventricular function with ejection fraction of 55%.  It was found that the patient had severe 1 vessel coronary atherosclerosis with significant ramus graft stenosis requiring further intervention. Therefore, the patient had a PCI and drug eluding stent placed without complication. The patient has been ambulating without further significant symptoms and has reached his maximal hospital benefit and will be discharged to home in good condition.  Cardiac rehabilitation has been discussed and recommended. Medication management of cardiovascular risk factors will be given post discharge and modified as an outpatient.   Discharge Exam: Blood pressure 120/74, pulse 73, temperature 98.3 F (36.8 C), temperature source Oral, resp. rate 16, height 5\' 7"  (1.702 m), weight 113.4 kg (250 lb), SpO2 100 %.  Constitutional: [place] oriented to person, place, and time. No distress.  HENT: No nasal discharge.   Head: Normocephalic and atraumatic.  Eyes: Pupils are equal and round. No discharge.  Neck: Normal range of motion. Neck supple. No JVD present. No thyromegaly present.  Cardiovascular: Normal rate, regular rhythm, normal S1 S2, no gallop, no friction rub. No murmur Pulmonary/Chest: Effort normal, No stridor. No respiratory distress. no wheezes.  no rales.    Abdominal: Soft. Bowel sounds are normal.  no distension.  no tenderness. There is no rebound and no guarding.  Musculoskeletal: No edema, no cyanosis, normal pulses, no bleeding, Normal range of motion. no tenderness.  Neurological:  alert and oriented to person, place, and time. Coordination normal.  Skin: Skin is warm and dry. No rash noted. No erythema. No pallor.  Psychiatric:  normal mood and affect. behavior is normal.    Labs:   Lab Results  Component Value Date   WBC 8.0 10/20/2015   HGB 10.3 (L) 10/20/2015   HCT 32.4 (L) 10/20/2015   MCV 78.3 10/20/2015   PLT 205 10/20/2015   No results for input(s): NA, K, CL, CO2, BUN, CREATININE, CALCIUM, PROT, BILITOT, ALKPHOS, ALT, AST, GLUCOSE in the last 168 hours.  Invalid input(s): LABALBU  EKG: NSR without evidence of new changes  FOLLOW UP IN ONE TO TWO WEEKS Discharge Instructions    AMB Referral to Cardiac Rehabilitation - Phase II    Complete by:  As directed    Diagnosis:  Coronary Stents     Allergies as of 02/17/2017      Reactions   Penicillin G Anaphylaxis, Other (See Comments)   Has patient had a PCN reaction causing immediate rash, facial/tongue/throat swelling, SOB or lightheadedness with hypotension: [id number]} Has patient had a PCN reaction causing severe rash involving mucus membranes or skin necrosis: no:[id number]} Has patient had a PCN reaction that required hospitalization no:[id number]} Has  patient had a PCN reaction occurring within the last 10 years: no:[id number]} If all of the above answers are "NO", then may proceed with Cephalosporin use.    Penicillins       Medication List    STOP taking these medications   BRILINTA 90 MG Tabs tablet Generic drug:  ticagrelor   carvedilol 3.125 MG tablet Commonly known as:  COREG     TAKE these medications   amLODipine 10 MG tablet Commonly known as:  NORVASC Take 1 tablet (10 mg total) by mouth daily.   aspirin EC 81 MG tablet Take 81 mg by mouth daily.   atorvastatin 80 MG tablet Commonly known as:  LIPITOR Take 1 tablet (80 mg total) by mouth daily at 6 PM.   augmented betamethasone dipropionate 0.05 % cream Commonly known as:  DIPROLENE-AF Apply 1 application topically daily. Reported on 11/18/2015   bisoprolol-hydrochlorothiazide 2.5-6.25 MG tablet Commonly known as:  ZIAC Take 1 tablet by mouth daily.   clopidogrel 75 MG tablet Commonly known as:  PLAVIX Take 75 mg by mouth daily.   cyclobenzaprine 10 MG tablet Commonly known as:  FLEXERIL Take 10 mg by mouth at bedtime.   estradiol 0.5 MG tablet Commonly known as:  ESTRACE Take 0.5 mg by mouth daily.   furosemide 40 MG tablet Commonly known as:  LASIX Take 40 mg by mouth daily.   losartan 50 MG tablet Commonly known as:  COZAAR Take 50 mg by mouth daily.   meclizine 25 MG tablet Commonly known as:  ANTIVERT Take 1 tablet (25 mg total) by mouth 3 (three) times daily as needed for dizziness.   metFORMIN 500 MG tablet Commonly known as:  GLUCOPHAGE Take 250 mg by mouth 2 (two) times daily with a meal.   montelukast 10 MG tablet Commonly known as:  SINGULAIR Take 10 mg by mouth daily.   nitroGLYCERIN 0.4 MG SL tablet Commonly known as:  NITROSTAT Place 0.4 mg under the tongue every 5 (five) minutes x 3 doses as needed for chest pain. *If no relief call MD or go to Emergency Room*   oxybutynin 5 MG tablet Commonly known as:  DITROPAN Take 5 mg by mouth daily after supper.   sertraline 100 MG tablet Commonly known as:  ZOLOFT Take 100 mg by mouth daily.   VENTOLIN HFA 108 (90 Base) MCG/ACT  inhaler Generic drug:  albuterol Inhale 2 puffs into the lungs every 6 (six) hours as needed for wheezing or shortness of breath.   zolpidem 10 MG tablet Commonly known as:  AMBIEN Take 10 mg by mouth at bedtime.      Follow-up Information    Corey Skains, MD Follow up in 2 week(s).   Specialty:  Cardiology Contact information: [address] [facility] [place] [address] [phone]           THE PATIENT  SHALL BRING ALL MEDICATIONS TO FOLLOW UP APPOINTMENT  Signed:  Corey Skains MD, [facility] 02/17/2017, 8:17 AM

## 2017-02-23 ENCOUNTER — Ambulatory Visit: Payer: Medicare Other | Admitting: Dietician

## 2017-03-08 IMAGING — CR DG CHEST 2V
2 series · 2 of 2 positions shown · non-contrast
Comparison: March 11, 2015

CLINICAL DATA: Chest pain

EXAM:
CHEST  2 VIEW

[chest pa]
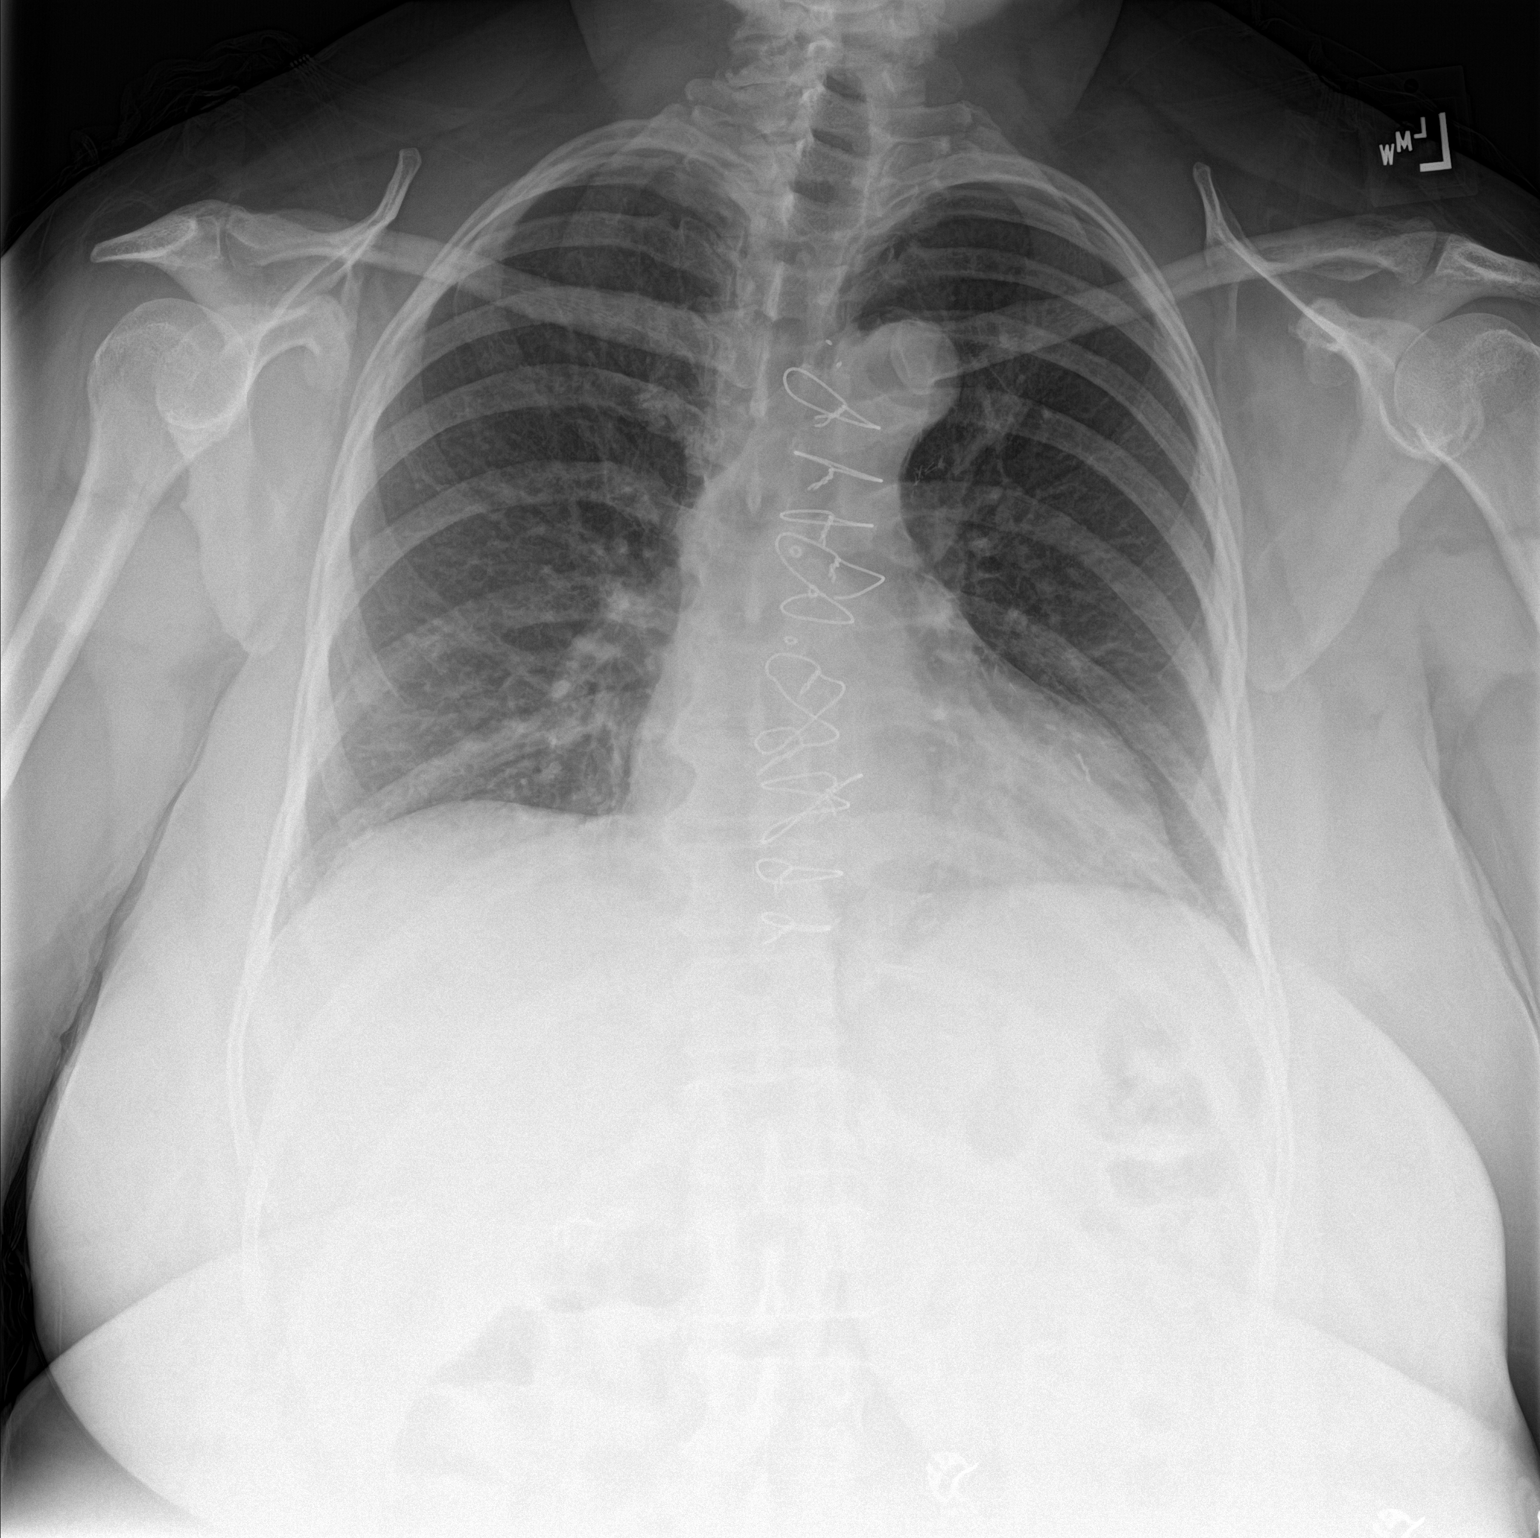

[chest lat]
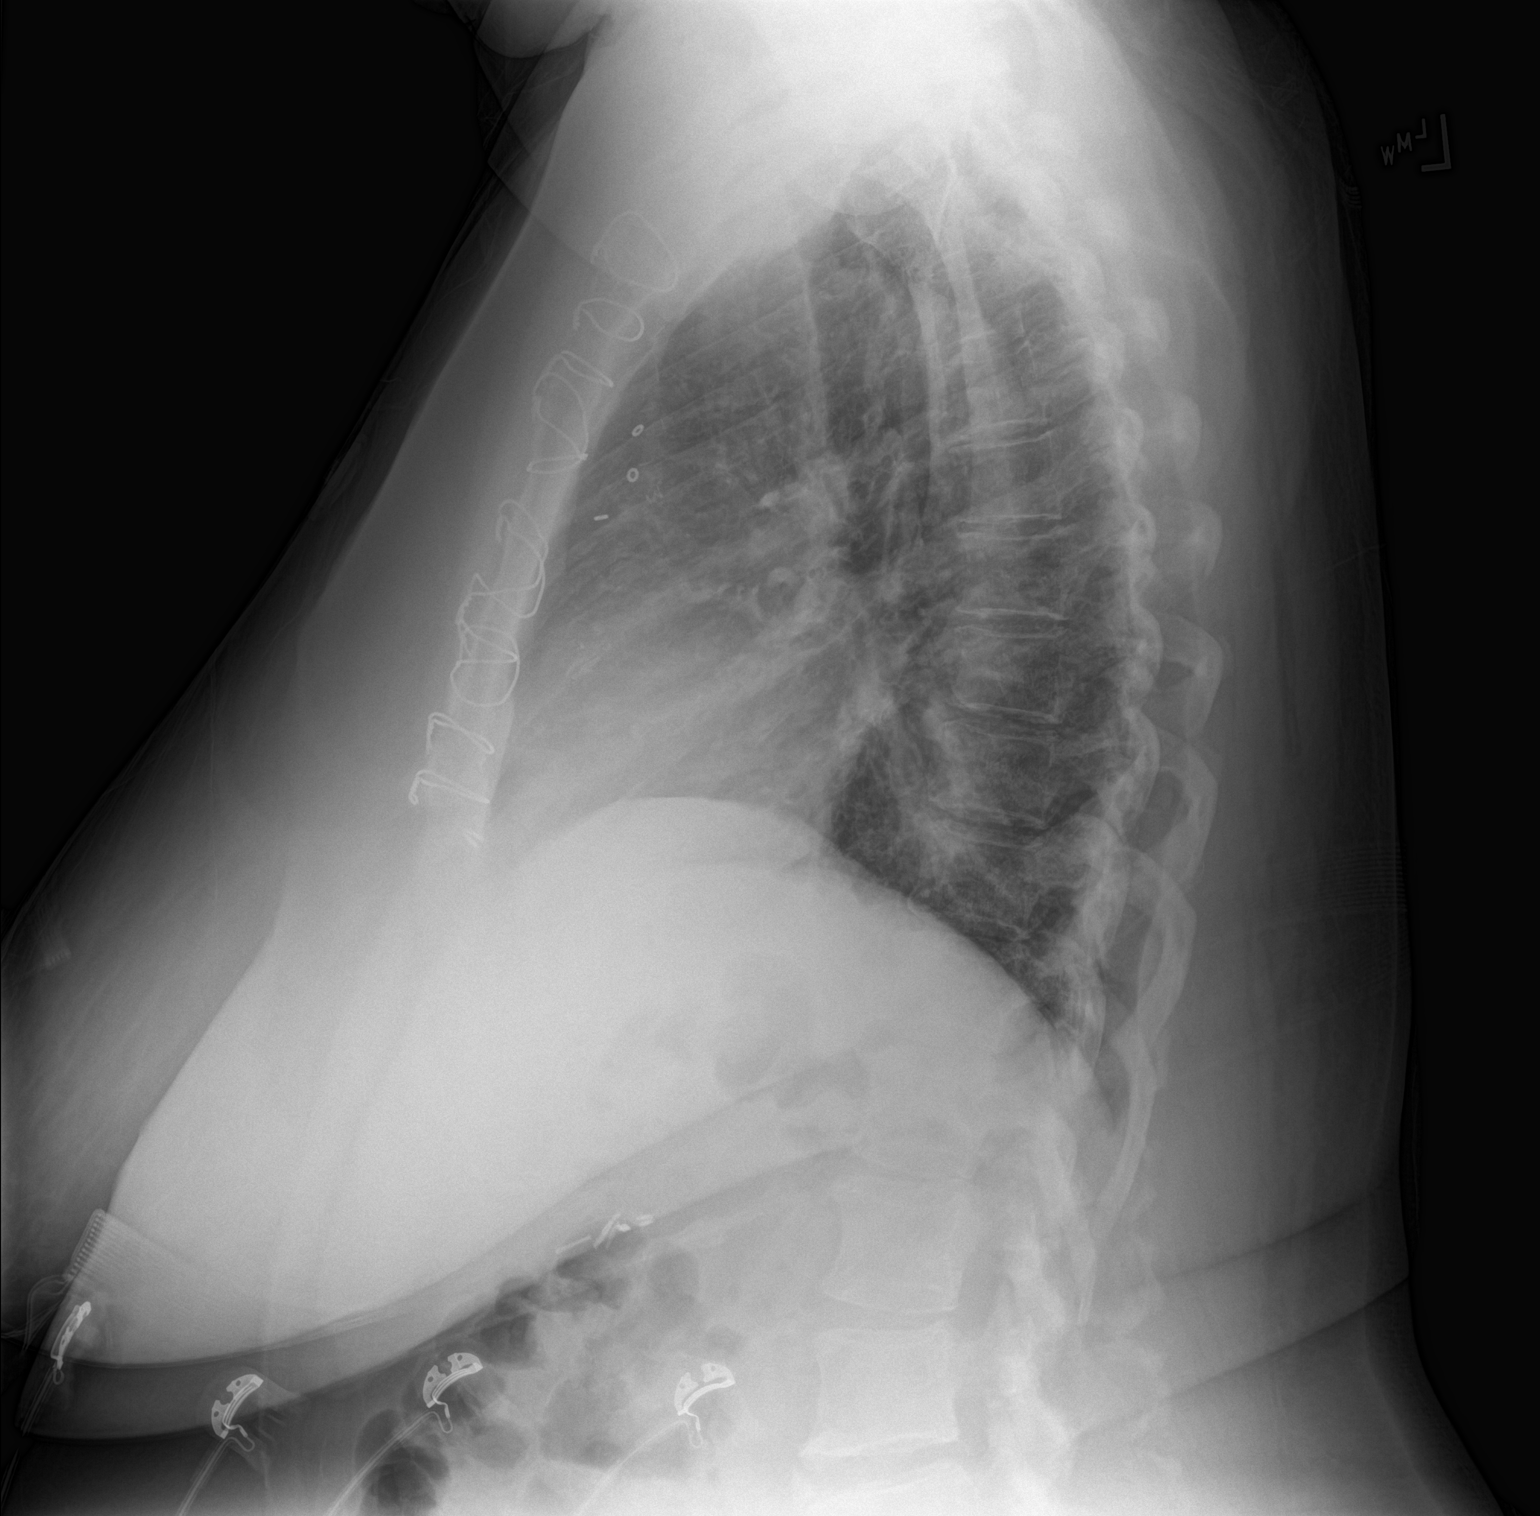

[2 of 2 positions shown; findings below may reference images not displayed]

FINDINGS: There is no edema or consolidation. The heart size and pulmonary
vascularity are normal. No adenopathy. Patient is status post
coronary artery bypass grafting. No adenopathy. No bone lesions.
IMPRESSION: No edema or consolidation.

## 2017-03-09 ENCOUNTER — Ambulatory Visit: Payer: Medicare Other | Admitting: Dietician

## 2017-03-10 ENCOUNTER — Encounter: Payer: Medicare Other | Attending: Internal Medicine | Admitting: Dietician

## 2017-03-10 ENCOUNTER — Encounter: Payer: Self-pay | Admitting: Dietician

## 2017-03-10 VITALS — Ht 67.0 in | Wt 252.3 lb

## 2017-03-10 DIAGNOSIS — E119 Type 2 diabetes mellitus without complications: Secondary | ICD-10-CM | POA: Diagnosis not present

## 2017-03-10 DIAGNOSIS — I25810 Atherosclerosis of coronary artery bypass graft(s) without angina pectoris: Secondary | ICD-10-CM | POA: Diagnosis not present

## 2017-03-10 DIAGNOSIS — I10 Essential (primary) hypertension: Secondary | ICD-10-CM | POA: Insufficient documentation

## 2017-03-10 DIAGNOSIS — Z713 Dietary counseling and surveillance: Secondary | ICD-10-CM | POA: Diagnosis not present

## 2017-03-10 DIAGNOSIS — I5032 Chronic diastolic (congestive) heart failure: Secondary | ICD-10-CM | POA: Diagnosis not present

## 2017-03-10 DIAGNOSIS — Z6839 Body mass index (BMI) 39.0-39.9, adult: Secondary | ICD-10-CM

## 2017-03-10 DIAGNOSIS — E669 Obesity, unspecified: Secondary | ICD-10-CM | POA: Diagnosis not present

## 2017-03-10 NOTE — Patient Instructions (Signed)
   Eat something every 3-4 hours during the day.  Try a protein/ nutrition drink such as Glucerna, Boost (low sugar version), Premier Protein, or Medical illustrator. Drink 1-2 per day, keep cold for better taste.   Protein foods such as lowfat cheese, lean deli meat like [place], chicken salad, boiled eggs can be eaten cold and might be easier to eat when appetite is poor. The protein will help with healing and keeping some strength.

## 2017-03-10 NOTE — Progress Notes (Signed)
Medical Nutrition Therapy: Visit start time: 0900  end time: 0930  Assessment:  Diagnosis: diabetes, obesity Medical history changes: recent heart cath and stent placement Psychosocial issues/ stress concerns: reports feeling "bummed" since heart surgery  Current weight: 252.3lbs  Height: 5'7" Medications, supplement changes: reconciled list in medical record  Progress and evaluation: Patient reports recent stent placement about 3 weeks ago. She had been having chest pain for several weeks prior to MD visit, which prompted heart cath procedure. She states she has had very little appetite since her surgery, and also very little energy. She had begun increasing fruit intake and started working with an exercise trainer just prior to her hospitalization.    Physical activity: contraindicated at this time  Dietary Intake:  Usual eating pattern includes several small meals and snacks per day.  Breakfast: currently fruit, small glass milk Snacks: graham crackers, milk, fruit throughout the day Beverages: 5-6 bottles water, milk  Nutrition Care Education: Topics covered: basic nutrition for poor appetite Basic nutrition: appropriate nutrient balance, appropriate meal and snack schedule    Diabetes: appropriate carb intake and balance, sick day guidelines Appetite: options for liquid nutrition, eating frequent small amounts of food or nutritious beverages, trying cool/ cold foods, importance of adequate protein intake for healing, importance of adequate nutrition for healing and energy.    Nutritional Diagnosis:  [place]-2.2 Altered nutrition-related laboratory As related to diabetes.  As evidenced by elevated BGs on lab reports. -3.3 Overweight/obesity As related to history of excess calories, inactivity.  As evidenced by BMI of 39.5.  Intervention: Discussion as noted above.   Advised patient to concentrate on getting adequately nourished until appetite improves, then will resume efforts for  weight loss. She will plan to eat small amounts every 3-4 hours and including protein sources.   Education Materials given:  Marland Kitchen Appetite loss tips . Snacking handout . Goals/ instructions  Learner/ who was taught:  . Patient   Level of understanding: Marland Kitchen Verbalizes/ demonstrates competency   Demonstrated degree of understanding via:   Teach back Learning barriers: . None  Willingness to learn/ readiness for change: . Eager, change in progress  Monitoring and Evaluation:  Dietary intake, exercise, BG control, and body weight      follow up: 04/28/17

## 2017-03-15 DIAGNOSIS — S90931A Unspecified superficial injury of right great toe, initial encounter: Secondary | ICD-10-CM | POA: Diagnosis not present

## 2017-03-15 DIAGNOSIS — S99921A Unspecified injury of right foot, initial encounter: Secondary | ICD-10-CM | POA: Diagnosis not present

## 2017-03-15 DIAGNOSIS — M2011 Hallux valgus (acquired), right foot: Secondary | ICD-10-CM | POA: Diagnosis not present

## 2017-03-15 DIAGNOSIS — S90934A Unspecified superficial injury of right lesser toe(s), initial encounter: Secondary | ICD-10-CM | POA: Diagnosis not present

## 2017-03-22 DIAGNOSIS — M659 Synovitis and tenosynovitis, unspecified: Secondary | ICD-10-CM | POA: Diagnosis not present

## 2017-03-22 DIAGNOSIS — S9031XA Contusion of right foot, initial encounter: Secondary | ICD-10-CM | POA: Diagnosis not present

## 2017-03-22 DIAGNOSIS — S90121A Contusion of right lesser toe(s) without damage to nail, initial encounter: Secondary | ICD-10-CM | POA: Diagnosis not present

## 2017-03-24 DIAGNOSIS — Z23 Encounter for immunization: Secondary | ICD-10-CM | POA: Diagnosis not present

## 2017-04-13 DIAGNOSIS — I2510 Atherosclerotic heart disease of native coronary artery without angina pectoris: Secondary | ICD-10-CM | POA: Diagnosis not present

## 2017-04-13 DIAGNOSIS — J4540 Moderate persistent asthma, uncomplicated: Secondary | ICD-10-CM | POA: Diagnosis not present

## 2017-04-13 DIAGNOSIS — Z0001 Encounter for general adult medical examination with abnormal findings: Secondary | ICD-10-CM | POA: Diagnosis not present

## 2017-04-13 DIAGNOSIS — E119 Type 2 diabetes mellitus without complications: Secondary | ICD-10-CM | POA: Diagnosis not present

## 2017-04-13 DIAGNOSIS — I10 Essential (primary) hypertension: Secondary | ICD-10-CM | POA: Diagnosis not present

## 2017-04-18 ENCOUNTER — Encounter: Payer: Self-pay | Admitting: Internal Medicine

## 2017-04-28 ENCOUNTER — Encounter: Payer: Medicare Other | Attending: Internal Medicine | Admitting: Dietician

## 2017-04-28 ENCOUNTER — Encounter: Payer: Self-pay | Admitting: Dietician

## 2017-04-28 VITALS — Ht 67.0 in | Wt 246.6 lb

## 2017-04-28 DIAGNOSIS — Z713 Dietary counseling and surveillance: Secondary | ICD-10-CM | POA: Insufficient documentation

## 2017-04-28 DIAGNOSIS — E119 Type 2 diabetes mellitus without complications: Secondary | ICD-10-CM | POA: Insufficient documentation

## 2017-04-28 DIAGNOSIS — Z6839 Body mass index (BMI) 39.0-39.9, adult: Secondary | ICD-10-CM

## 2017-04-28 DIAGNOSIS — I10 Essential (primary) hypertension: Secondary | ICD-10-CM | POA: Diagnosis not present

## 2017-04-28 NOTE — Patient Instructions (Addendum)
   Include some protein food with breakfast and supper-- such as 1/4 cup nuts, or 2Tablespoons peanut butter, or 1oz low fat cheese, or 1-2 eggs.   Goal is at least 1200 calories daily, and 60 grams of protein to make sure you meet your basic nutrition needs, and help your exercise work best for you.   Keep up your regular exercise, great job!

## 2017-04-28 NOTE — Progress Notes (Signed)
Medical Nutrition Therapy: Visit start time: 0900  end time: 0920  Assessment:  Diagnosis: diabetes, obesity Medical history changes: no changes per patient Psychosocial issues/ stress concerns: none  Current weight: 252.3lbs  Height: 5'7" Medications, supplement changes: no changes per patient  Progress and evaluation: Weight loss of about 6lbs or more since 03/10/17; weight measured while wearing heavy sweater today. Ms. Heinle reports feeling better now with improved appetite and energy since stent surgery about 2 months ago. She has resumed a more structured eating pattern, and is now engaging personal training service. She has started using Herbalife shakes, which she has purchased from her trainer. She reports testing fasting BGs, which are ranging from 80s-low 100s.   Physical activity: gym 3 days a week, varied activities  Dietary Intake:  Usual eating pattern includes 2 meals, 2 protein shakes and 0-1 snacks per day. Dining out frequency: not assessed today.  Breakfast: plain instant oatmeal with added milk and sugar, toast, 1/2 glass orange juice Snack: none Lunch: Herbalife protein shake with 24g protein, 200-250kcal, 18g CHO, and fruit Snack: fruit Supper: fruit salad or sandwich Snack: protein shake Beverages: 8 bottles water daily, orange juice in am, protein shakes  Nutrition Care Education: Topics covered: weight management Basic nutrition: appropriate nutrient balance    Weight control: discussed healthy weight loss rate and importance of ability to maintain lifelong healthy habits to avoid weight regain; effects of low kcal intake on metabolism and weight regain; basic kcal and protein needs-- advised at least 1200kcal daily and 60g protein. Discussed options for adding protein source to meals. Diabetes: appropriate carb intake and balance   Nutritional Diagnosis:  [place]-3.3 Overweight/obesity As related to history of excess calories and inactivity.  As evidenced by BMI  39.4, patient report.  Intervention: Discussion as noted above.   Commended patient for positive changes made.    Encouraged including variety of foods and protein drinks to ensure adequate nutrition and promote long-term adherence to healthy eating pattern for weight loss.   Set goals with input from patient.  Education Materials given:  Marland Kitchen Goals/ instructions  Learner/ who was taught:  . Patient   Level of understanding: Marland Kitchen Verbalizes/ demonstrates competency  Demonstrated degree of understanding via:   Teach back Learning barriers: . None  Willingness to learn/ readiness for change: . Eager, change in progress  Monitoring and Evaluation:  Dietary intake, exercise, BG control, and body weight      follow up: 06/23/17

## 2017-05-04 DIAGNOSIS — R921 Mammographic calcification found on diagnostic imaging of breast: Secondary | ICD-10-CM | POA: Diagnosis not present

## 2017-05-04 DIAGNOSIS — Z1231 Encounter for screening mammogram for malignant neoplasm of breast: Secondary | ICD-10-CM | POA: Diagnosis not present

## 2017-05-12 DIAGNOSIS — I5032 Chronic diastolic (congestive) heart failure: Secondary | ICD-10-CM | POA: Diagnosis not present

## 2017-05-12 DIAGNOSIS — I10 Essential (primary) hypertension: Secondary | ICD-10-CM | POA: Diagnosis not present

## 2017-05-12 DIAGNOSIS — I25810 Atherosclerosis of coronary artery bypass graft(s) without angina pectoris: Secondary | ICD-10-CM | POA: Diagnosis not present

## 2017-05-12 DIAGNOSIS — E782 Mixed hyperlipidemia: Secondary | ICD-10-CM | POA: Diagnosis not present

## 2017-05-19 ENCOUNTER — Ambulatory Visit: Payer: Self-pay | Admitting: Obstetrics and Gynecology

## 2017-06-08 DIAGNOSIS — R921 Mammographic calcification found on diagnostic imaging of breast: Secondary | ICD-10-CM | POA: Diagnosis not present

## 2017-06-08 DIAGNOSIS — R928 Other abnormal and inconclusive findings on diagnostic imaging of breast: Secondary | ICD-10-CM | POA: Diagnosis not present

## 2017-06-15 DIAGNOSIS — Z803 Family history of malignant neoplasm of breast: Secondary | ICD-10-CM | POA: Diagnosis not present

## 2017-06-15 DIAGNOSIS — R928 Other abnormal and inconclusive findings on diagnostic imaging of breast: Secondary | ICD-10-CM | POA: Diagnosis not present

## 2017-06-15 DIAGNOSIS — N6091 Unspecified benign mammary dysplasia of right breast: Secondary | ICD-10-CM | POA: Diagnosis not present

## 2017-06-15 DIAGNOSIS — D241 Benign neoplasm of right breast: Secondary | ICD-10-CM | POA: Diagnosis not present

## 2017-06-15 DIAGNOSIS — R921 Mammographic calcification found on diagnostic imaging of breast: Secondary | ICD-10-CM | POA: Diagnosis not present

## 2017-06-23 ENCOUNTER — Ambulatory Visit: Payer: Medicare Other | Admitting: Dietician

## 2017-07-12 ENCOUNTER — Ambulatory Visit: Payer: Medicare Other | Admitting: Dietician

## 2017-07-15 ENCOUNTER — Telehealth: Payer: Self-pay | Admitting: Dietician

## 2017-07-15 NOTE — Telephone Encounter (Signed)
Called patient to reschedule her appointment for 07/18/17 due to pending winter weather; unable to reach patient after 2 attempts. Message will be left on our office voicemail regarding closure.

## 2017-07-18 ENCOUNTER — Ambulatory Visit: Payer: Medicare Other | Admitting: Dietician

## 2017-07-18 ENCOUNTER — Telehealth: Payer: Self-pay | Admitting: Dietician

## 2017-07-18 ENCOUNTER — Other Ambulatory Visit: Payer: Self-pay

## 2017-07-18 MED ORDER — MONTELUKAST SODIUM 10 MG PO TABS: 10.0000 mg | ORAL_TABLET | Freq: Every day | ORAL | 5 refills | Status: DC

## 2017-07-18 MED ORDER — AMLODIPINE BESYLATE 10 MG PO TABS: 10.0000 mg | ORAL_TABLET | Freq: Every day | ORAL | 5 refills | Status: DC

## 2017-07-18 MED ORDER — RANITIDINE HCL 150 MG PO TABS: 150.0000 mg | ORAL_TABLET | Freq: Two times a day (BID) | ORAL | 5 refills | Status: DC

## 2017-07-18 MED ORDER — OXYBUTYNIN CHLORIDE 5 MG PO TABS: 5.0000 mg | ORAL_TABLET | Freq: Every day | ORAL | 5 refills | Status: DC

## 2017-07-18 MED ORDER — BISOPROLOL-HYDROCHLOROTHIAZIDE 2.5-6.25 MG PO TABS: 1.0000 | ORAL_TABLET | Freq: Every day | ORAL | 5 refills | Status: DC

## 2017-07-18 MED ORDER — ESTRADIOL 0.5 MG PO TABS: 0.5000 mg | ORAL_TABLET | Freq: Every day | ORAL | 5 refills | Status: DC

## 2017-07-18 NOTE — Telephone Encounter (Signed)
Called patient to return message from her to reschedule her appointment which was cancelled this morning due to inclement weather. Offered dates and times available and requested a call back.

## 2017-07-29 ENCOUNTER — Encounter: Payer: Self-pay | Admitting: Nurse Practitioner

## 2017-07-29 ENCOUNTER — Ambulatory Visit (INDEPENDENT_AMBULATORY_CARE_PROVIDER_SITE_OTHER): Payer: Medicare Other | Admitting: Nurse Practitioner

## 2017-07-29 VITALS — BP 119/73 | HR 63 | Resp 16 | Ht 67.0 in | Wt 253.0 lb

## 2017-07-29 DIAGNOSIS — I119 Hypertensive heart disease without heart failure: Secondary | ICD-10-CM

## 2017-07-29 DIAGNOSIS — E119 Type 2 diabetes mellitus without complications: Secondary | ICD-10-CM

## 2017-07-29 DIAGNOSIS — K219 Gastro-esophageal reflux disease without esophagitis: Secondary | ICD-10-CM

## 2017-07-29 LAB — POCT GLYCOSYLATED HEMOGLOBIN (HGB A1C): HEMOGLOBIN A1C: 6.5

## 2017-07-29 MED ORDER — OMEPRAZOLE 40 MG PO CPDR: 40.0000 mg | DELAYED_RELEASE_CAPSULE | Freq: Every day | ORAL | 3 refills | Status: DC

## 2017-07-29 NOTE — Progress Notes (Addendum)
[facility] [address] [place], [place] [address]  Internal MEDICINE  Office Visit Note  Patient Name: Felicia Woods  05/30/1956  [id number]  Date of Service: 07/29/2017  Chief Complaint  Patient presents with  . Follow-up    3 month  . Diabetes    well controlled  . Cough    at night    Patient here for routine follow up. Blood sugars and blood pressure are well controlled. Did have abnormal mammogram on right side. Biopsy was negative for malignancy.    Cough  This is a recurrent problem. The current episode started 1 to 4 weeks ago. The problem has been unchanged. Episode frequency: every night. The cough is non-productive. Pertinent negatives include no chest pain, chills, eye redness, headaches, postnasal drip, rash, rhinorrhea, sore throat, shortness of breath or wheezing. The symptoms are aggravated by lying down. She has tried nothing for the symptoms. Her past medical history is significant for environmental allergies.    Pt is here for routine follow up.    Current Medication: Outpatient Encounter Medications as of 07/29/2017  Medication Sig Note  . amLODipine (NORVASC) 10 MG tablet Take 1 tablet (10 mg total) by mouth daily.   . aspirin EC 81 MG tablet Take 81 mg by mouth daily.   . atorvastatin (LIPITOR) 80 MG tablet Take 1 tablet (80 mg total) by mouth daily at 6 PM. (Patient not taking: Reported on 03/10/2017)   . augmented betamethasone dipropionate (DIPROLENE-AF) 0.05 % cream Apply 1 application topically daily. Reported on 11/18/2015   . bisoprolol-hydrochlorothiazide (ZIAC) 2.5-6.25 MG tablet Take 1 tablet by mouth daily.   . clopidogrel (PLAVIX) 75 MG tablet Take 75 mg by mouth daily. 02/16/2017: New script has not started yet  . cyclobenzaprine (FLEXERIL) 10 MG tablet Take 10 mg by mouth at bedtime.    . estradiol (ESTRACE) 0.5 MG tablet Take 1 tablet (0.5 mg total) by mouth daily.   . furosemide (LASIX) 40 MG tablet Take 40 mg by mouth daily.     . isosorbide mononitrate (IMDUR) 30 MG 24 hr tablet Take by mouth.   . losartan (COZAAR) 50 MG tablet Take 50 mg by mouth daily.    . meclizine (ANTIVERT) 25 MG tablet Take 1 tablet (25 mg total) by mouth 3 (three) times daily as needed for dizziness. (Patient not taking: Reported on 11/18/2015)   . metFORMIN (GLUCOPHAGE) 500 MG tablet Take 250 mg by mouth 2 (two) times daily with a meal.    . montelukast (SINGULAIR) 10 MG tablet Take 1 tablet (10 mg total) by mouth daily.   . nitroGLYCERIN (NITROSTAT) 0.4 MG SL tablet Place 0.4 mg under the tongue every 5 (five) minutes x 3 doses as needed for chest pain. *If no relief call MD or go to Emergency Room*   . oxybutynin (DITROPAN) 5 MG tablet Take 1 tablet (5 mg total) by mouth daily after supper.   . ranitidine (ZANTAC) 150 MG tablet Take 1 tablet (150 mg total) by mouth 2 (two) times daily.   . sertraline (ZOLOFT) 100 MG tablet Take 100 mg by mouth daily.    . simvastatin (ZOCOR) 80 MG tablet Take 80 mg by mouth daily.   . VENTOLIN HFA 108 (90 BASE) MCG/ACT inhaler Inhale 2 puffs into the lungs every 6 (six) hours as needed for wheezing or shortness of breath.    . zolpidem (AMBIEN) 10 MG tablet Take 10 mg by mouth at bedtime.    No facility-administered   encounter medications on file as of 07/29/2017.     Surgical History: Past Surgical History:  Procedure Laterality Date  . ABDOMINAL HYSTERECTOMY    . CARDIAC CATHETERIZATION    . CARDIAC CATHETERIZATION N/A 10/18/2015   Procedure: Left Heart Cath and Cors/Grafts Angiography;  Surgeon: Lorretta Harp, MD;  Location: [place] [facility];  Service: Cardiovascular;  Laterality: N/A;  . CARDIAC CATHETERIZATION N/A 10/18/2015   Procedure: Coronary Stent Intervention;  Surgeon: Lorretta Harp, MD;  Location: [place] [facility];  Service: Cardiovascular;  Laterality: N/A;  . CARDIAC SURGERY    . CHOLECYSTECTOMY    . CORONARY ANGIOPLASTY    . CORONARY ARTERY BYPASS GRAFT     4 vessels - 2010  .  CORONARY STENT INTERVENTION N/A 02/16/2017   Procedure: CORONARY STENT INTERVENTION;  Surgeon: Yolonda Kida, MD;  Location: [place] [facility];  Service: Cardiovascular;  Laterality: N/A;  . LEFT HEART CATH AND CORONARY ANGIOGRAPHY Left 02/16/2017   Procedure: LEFT HEART CATH AND CORONARY ANGIOGRAPHY;  Surgeon: Corey Skains, MD;  Location: [place] [facility];  Service: Cardiovascular;  Laterality: Left;    Medical History: Past Medical History:  Diagnosis Date  . Asthma   . Coronary artery disease   . Diabetes mellitus without complication ([place])   . Hyperlipidemia   . Hypertension   . MI (myocardial infarction) ([place])   . Migraine headache with aura   . 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    Family History: History reviewed. No pertinent family history.  Social History   Socioeconomic History  . Marital status: Single    Spouse name: Not on file  . Number of children: Not on file  . Years of education: Not on file  . Highest education level: Not on file  Social Needs  . Financial resource strain: Not on file  . Food insecurity - worry: Not on file  . Food insecurity - inability: Not on file  . Transportation needs - medical: Not on file  . Transportation needs - non-medical: Not on file  Occupational History  . Not on file  Tobacco Use  . Smoking status: Never Smoker  . Smokeless tobacco: Never Used  Substance and Sexual Activity  . Alcohol use: No    Alcohol/week: 0.0 oz  . Drug use: No  . Sexual activity: Yes  Other Topics Concern  . Not on file  Social History Narrative  . Not on file      Review of Systems  Constitutional: Negative for activity change, chills, fatigue and unexpected weight change.  HENT: Negative for congestion, postnasal drip, rhinorrhea, sneezing and sore throat.   Eyes: Negative for redness.  Respiratory: Positive for cough. Negative for chest tightness, shortness of breath and wheezing.        Cough Is only at night   Cardiovascular: Negative for chest pain and palpitations.       Patient is seeing her cardiologist every 6 months  Gastrointestinal: Negative for abdominal pain, constipation, diarrhea, nausea and vomiting.  Endocrine:       Blood sugars doing well   Genitourinary: Negative for dysuria and frequency.  Musculoskeletal: Negative for arthralgias, back pain, joint swelling and neck pain.  Skin: Negative for rash.  Allergic/Immunologic: Positive for environmental allergies.  Neurological: Negative for tremors, numbness and headaches.  Hematological: Negative.  Negative for adenopathy. Does not bruise/bleed easily.  Psychiatric/Behavioral: Negative for behavioral problems (Depression), sleep disturbance and suicidal ideas. The patient is not  nervous/anxious.     Today's Vitals   07/29/17 1029  BP: 119/73  Pulse: 63  Resp: 16  SpO2: 97%  Weight: 253 lb (114.8 kg)  Height: 5' 7" (1.702 m)    Physical Exam  Constitutional: She is oriented to person, place, and time. She appears well-developed and well-nourished. No distress.  HENT:  Head: Normocephalic and atraumatic.  Mouth/Throat: Oropharynx is clear and moist. No oropharyngeal exudate.  Eyes: EOM are normal. Pupils are equal, round, and reactive to light.  Neck: Normal range of motion. Neck supple. No JVD present. Carotid bruit is not present. No tracheal deviation present. No thyromegaly present.  Cardiovascular: Normal rate, regular rhythm and normal heart sounds. Exam reveals no gallop and no friction rub.  No murmur heard. Pulmonary/Chest: Effort normal and breath sounds normal. No respiratory distress. She has no wheezes. She has no rales. She exhibits no tenderness.  Abdominal: Soft. Bowel sounds are normal. There is no tenderness.  Musculoskeletal: Normal range of motion.  Lymphadenopathy:    She has no cervical adenopathy.  Neurological: She is alert and oriented to person, place, and time. No cranial nerve deficit.  Skin:  Skin is warm and dry. She is not diaphoretic.  Psychiatric: She has a normal mood and affect. Her behavior is normal. Judgment and thought content normal.  Nursing note and vitals reviewed.   Assessment/Plan:  1. Diabetes mellitus without complication (HCC) - POCT HgB A1C - 6.5 today. Continue diabetic medication as prescribed. Continue to follow ADA diet  2. Gastroesophageal reflux disease without esophagitis Likely cause of nocturnal cough. Add omeprazole 82m at dinner time. Continue zantac 1517mBID. Avoid triggers. Consider sleeping with head of bead raised to 30 degrees.  - omeprazole (PRILOSEC) 40 MG capsule; Take 1 capsule (40 mg total) by mouth daily with supper.  Dispense: 30 capsule; Refill: 3  3. Hypertensive heart disease without heart failure Stable. Continue BP meds and clopidogrel as prescribed. Regular visits with cardiology as scheduled.   General Counseling: Kerigan verbalizes understanding of the findings of todays visit and agrees with plan of treatment. I have discussed any further diagnostic evaluation that may be needed or ordered today. We also reviewed her medications today. she has been encouraged to call the office with any questions or concerns that should arise related to todays visit.  This patient was seen by HeLeretha PolFNP- C in Collaboration with Dr FoLavera Guises a part of collaborative care agreement    Orders Placed This Encounter  Procedures  . POCT HgB A1C      Time spent: 15 Minutes     Dr FoLavera Guisenternal medicine

## 2017-08-01 ENCOUNTER — Encounter: Payer: Self-pay | Admitting: Dietician

## 2017-08-01 ENCOUNTER — Encounter: Payer: Medicare Other | Attending: Internal Medicine | Admitting: Dietician

## 2017-08-01 VITALS — Ht 67.0 in | Wt 252.3 lb

## 2017-08-01 DIAGNOSIS — Z713 Dietary counseling and surveillance: Secondary | ICD-10-CM | POA: Insufficient documentation

## 2017-08-01 DIAGNOSIS — Z6839 Body mass index (BMI) 39.0-39.9, adult: Secondary | ICD-10-CM

## 2017-08-01 DIAGNOSIS — E119 Type 2 diabetes mellitus without complications: Secondary | ICD-10-CM | POA: Diagnosis not present

## 2017-08-01 NOTE — Progress Notes (Signed)
Medical Nutrition Therapy: Visit start time: 0900  end time: 0930  Assessment:  Diagnosis: diabetes, obesity Medical history changes: no changes per patient Psychosocial issues/ stress concerns: none currently  Current weight: 252.3lbs  Height: 5'7" Medications, supplement changes: no changes per patient  Progress and evaluation: HbA1C was tested on 07/29/17, with result of 6.5%. Patient is testing fasting BGs at home and reports readings usually 90s-120. She reports significant stress since 06/2017, having to undergo scans and biopsy to determine presence of breast cancer. She states that biopsy was negative, but the stress prior to that has led to weight gain.   Physical activity: none recently, health-related stress. Plans to start back to gym today.  Dietary Intake:  Usual eating pattern includes 3 meals and 2 snacks per day. Dining out frequency: not assessed today.  Breakfast: oat crunch cheerios  Snack: fruit Lunch: chicken salad only Snack: apple or grapes Supper: baked chicken with green vegetables Snack: none Beverages: water, crystal light  Nutrition Care Education: Topics covered: weight management, diabetes Basic nutrition: appropriate nutrient balance    Weight control: reviewed patient's progress since previous visit and discussed strategies to resume plan for weight loss; reviewed importance of adequate calorie intake to maintain healthy metabolism and fuel exercise Diabetes: appropriate meal and snack schedule, appropriate carb intake and balance and food choices-- advised adding 1 carb serving to lunch and dinner meals when resuming regular exercise.   Nutritional Diagnosis:  [place]-2.2 Altered nutrition-related laboratory As related to diabetes.  As evidenced by HbA1C of 6.5%. [place]-3.3 Overweight/obesity As related to history of excess calories and inactivity.  As evidenced by BMI 39.5, and patient report.  Intervention: Discussion as noted above.   Patient remains  motivated to lose weight prior to her son's wedding in June this year.    Updated goals with input from patient.   Invited patient to check weight using scale in this office as often as needed prior to next appointment.  Education Materials given:  Marland Kitchen Goals/ instructions   Learner/ who was taught:  . Patient   Level of understanding: Marland Kitchen Verbalizes/ demonstrates competency   Demonstrated degree of understanding via:   Teach back Learning barriers: . None  Willingness to learn/ readiness for change: . Eager, change in progress  Monitoring and Evaluation:  Dietary intake, exercise, BG control, and body weight      follow up: 09/05/17

## 2017-08-01 NOTE — Patient Instructions (Signed)
   Resume exercise with gym visits.  Add a small serving 1/2cup of healthy carb with meals -- whole grain bread or crackers with chicken salad, brown rice, beans or peas or sweet potato with dinner meal. This will help provide energy for your exercise.   You can add a small amount of protein, such as low fat cheese or 2-4Tbsp nuts with fruit for snacks.

## 2017-09-05 ENCOUNTER — Ambulatory Visit: Payer: Medicare Other | Admitting: Dietician

## 2017-09-08 ENCOUNTER — Other Ambulatory Visit: Payer: Self-pay

## 2017-09-09 ENCOUNTER — Other Ambulatory Visit: Payer: Self-pay

## 2017-09-09 MED ORDER — GLIMEPIRIDE 1 MG PO TABS: 1.0000 mg | ORAL_TABLET | Freq: Every day | ORAL | 1 refills | Status: DC

## 2017-09-09 MED ORDER — LOSARTAN POTASSIUM 50 MG PO TABS: 50.0000 mg | ORAL_TABLET | Freq: Every day | ORAL | 1 refills | Status: DC

## 2017-09-09 MED ORDER — OXYBUTYNIN CHLORIDE 5 MG PO TABS: 5.0000 mg | ORAL_TABLET | Freq: Every day | ORAL | 1 refills | Status: DC

## 2017-09-30 ENCOUNTER — Encounter: Payer: Self-pay | Admitting: Dietician

## 2017-09-30 NOTE — Progress Notes (Signed)
Have not heard back from patient since she missed her appointment on 09/05/17. Sent discharge letter to referring provider.

## 2017-10-27 DIAGNOSIS — M1712 Unilateral primary osteoarthritis, left knee: Secondary | ICD-10-CM | POA: Diagnosis not present

## 2017-10-27 DIAGNOSIS — M1711 Unilateral primary osteoarthritis, right knee: Secondary | ICD-10-CM | POA: Diagnosis not present

## 2017-11-01 ENCOUNTER — Emergency Department: Payer: Medicare Other

## 2017-11-01 ENCOUNTER — Ambulatory Visit: Payer: Self-pay | Admitting: Internal Medicine

## 2017-11-01 ENCOUNTER — Observation Stay
Admission: EM | Admit: 2017-11-01 | Discharge: 2017-11-03 | Disposition: A | Discharge status: Home / Self Care | Payer: Medicare Other | Attending: Family Medicine | Admitting: Family Medicine

## 2017-11-01 ENCOUNTER — Encounter: Payer: Self-pay | Admitting: Emergency Medicine

## 2017-11-01 ENCOUNTER — Ambulatory Visit: Payer: Self-pay | Admitting: Nurse Practitioner

## 2017-11-01 ENCOUNTER — Other Ambulatory Visit: Payer: Self-pay

## 2017-11-01 DIAGNOSIS — I5032 Chronic diastolic (congestive) heart failure: Secondary | ICD-10-CM | POA: Diagnosis not present

## 2017-11-01 DIAGNOSIS — K219 Gastro-esophageal reflux disease without esophagitis: Secondary | ICD-10-CM | POA: Diagnosis not present

## 2017-11-01 DIAGNOSIS — E785 Hyperlipidemia, unspecified: Secondary | ICD-10-CM | POA: Diagnosis not present

## 2017-11-01 DIAGNOSIS — I200 Unstable angina: Secondary | ICD-10-CM

## 2017-11-01 DIAGNOSIS — I252 Old myocardial infarction: Secondary | ICD-10-CM | POA: Diagnosis not present

## 2017-11-01 DIAGNOSIS — I110 Hypertensive heart disease with heart failure: Secondary | ICD-10-CM | POA: Insufficient documentation

## 2017-11-01 DIAGNOSIS — I2510 Atherosclerotic heart disease of native coronary artery without angina pectoris: Secondary | ICD-10-CM | POA: Insufficient documentation

## 2017-11-01 DIAGNOSIS — Z79899 Other long term (current) drug therapy: Secondary | ICD-10-CM | POA: Insufficient documentation

## 2017-11-01 DIAGNOSIS — J45909 Unspecified asthma, uncomplicated: Secondary | ICD-10-CM | POA: Diagnosis not present

## 2017-11-01 DIAGNOSIS — E119 Type 2 diabetes mellitus without complications: Secondary | ICD-10-CM | POA: Diagnosis not present

## 2017-11-01 DIAGNOSIS — Z7982 Long term (current) use of aspirin: Secondary | ICD-10-CM | POA: Diagnosis not present

## 2017-11-01 DIAGNOSIS — Z951 Presence of aortocoronary bypass graft: Secondary | ICD-10-CM | POA: Diagnosis not present

## 2017-11-01 DIAGNOSIS — R0789 Other chest pain: Principal | ICD-10-CM | POA: Insufficient documentation

## 2017-11-01 DIAGNOSIS — R0602 Shortness of breath: Secondary | ICD-10-CM | POA: Diagnosis not present

## 2017-11-01 DIAGNOSIS — R079 Chest pain, unspecified: Secondary | ICD-10-CM | POA: Diagnosis not present

## 2017-11-01 LAB — BASIC METABOLIC PANEL
Anion gap: 8 (ref 5–15)
BUN: 14 mg/dL (ref 6–20)
CHLORIDE: 103 mmol/L (ref 101–111)
CO2: 25 mmol/L (ref 22–32)
Calcium: 9.1 mg/dL (ref 8.9–10.3)
Creatinine, Ser: 0.73 mg/dL (ref 0.44–1.00)
GFR calc Af Amer: >60 mL/min (ref >60)
Glucose, Bld: 175 mg/dL — ABNORMAL HIGH (ref 65–99)
Potassium: 4.8 mmol/L (ref 3.5–5.1)
SODIUM: 136 mmol/L (ref 135–145)

## 2017-11-01 LAB — HEPATIC FUNCTION PANEL
ALK PHOS: 78 U/L (ref 38–126)
ALT: 19 U/L (ref 14–54)
AST: 28 U/L (ref 15–41)
Albumin: 3.9 g/dL (ref 3.5–5.0)
BILIRUBIN INDIRECT: 0.4 mg/dL (ref 0.3–0.9)
BILIRUBIN TOTAL: 0.6 mg/dL (ref 0.3–1.2)
Bilirubin, Direct: 0.2 mg/dL (ref 0.1–0.5)
TOTAL PROTEIN: 7.8 g/dL (ref 6.5–8.1)

## 2017-11-01 LAB — CBC
HCT: 35.7 % (ref 35.0–47.0)
Hemoglobin: 11.9 g/dL — ABNORMAL LOW (ref 12.0–16.0)
MCH: 25.9 pg — ABNORMAL LOW (ref 26.0–34.0)
MCHC: 33.5 g/dL (ref 32.0–36.0)
MCV: 77.3 fL — AB (ref 80.0–100.0)
Platelets: 145 10*3/uL — ABNORMAL LOW (ref 150–440)
RBC: 4.62 MIL/uL (ref 3.80–5.20)
RDW: 17.6 % — ABNORMAL HIGH (ref 11.5–14.5)
WBC: 15.2 10*3/uL — AB (ref 3.6–11.0)

## 2017-11-01 LAB — TROPONIN I: Troponin I: <0.03 ng/mL (ref <0.03)

## 2017-11-01 MED ORDER — NITROGLYCERIN 0.4 MG SL SUBL
0.4000 mg | SUBLINGUAL_TABLET | SUBLINGUAL | Status: DC | PRN
  Administered 2017-11-01 (×3): 0.4 mg via SUBLINGUAL
  Filled 2017-11-01: qty 1

## 2017-11-01 MED ORDER — FENTANYL CITRATE (PF) 100 MCG/2ML IJ SOLN
50.0000 ug | Freq: Once | INTRAMUSCULAR | Status: DC
  Filled 2017-11-01: qty 2

## 2017-11-01 MED ORDER — ASPIRIN 81 MG PO CHEW
162.0000 mg | CHEWABLE_TABLET | Freq: Once | ORAL | Status: AC
  Administered 2017-11-01: 162 mg via ORAL
  Filled 2017-11-01: qty 2

## 2017-11-01 MED ORDER — FENTANYL CITRATE (PF) 100 MCG/2ML IJ SOLN
75.0000 ug | Freq: Once | INTRAMUSCULAR | Status: AC
  Administered 2017-11-01: 75 ug via INTRAVENOUS
  Filled 2017-11-01: qty 2

## 2017-11-01 NOTE — ED Triage Notes (Signed)
Patient states she had chest pain that started at 2200 and feels like it did last year when she had a heart attack. Patient states she also feels SOB.

## 2017-11-01 NOTE — ED Provider Notes (Signed)
[facility] Emergency Department Provider Note  ____________________________________________   First MD Initiated Contact with Patient 11/01/17 2302     (approximate)  I have reviewed the triage vital signs and the nursing notes.   HISTORY  Chief Complaint Chest Pain and Shortness of Breath   HPI Felicia Woods is a 62 y.o. female who comes to the emergency department with sharp right-sided chest pain radiating to her right neck that began suddenly at 10 PM.  She has a past medical history of a CABG as well as myocardial infarction and this feels identical to previous heart attacks.  The pain is worse when taking a deep breath and somewhat improved with rest.  She has had no history of DVT or pulmonary embolism.  No leg swelling.  No recent surgery travel or immobilization.  The pain is clearly worse with taking deep breaths and somewhat improved with rest.  It is not particularly exertional.  Past Medical History:  Diagnosis Date  . Asthma   . Coronary artery disease   . Diabetes mellitus without complication ([place])   . Hyperlipidemia   . Hypertension   . MI (myocardial infarction) ([place])   . Migraine headache with aura   . 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    Patient Active Problem List   Diagnosis Date Noted  . Chest pain 11/02/2017  . S/P drug eluting coronary stent placement 02/16/2017  . Acute on chronic diastolic CHF (congestive heart failure), NYHA class 3 ([place]) 12/07/2016  . Numbness and tingling 12/01/2016  . Chronic tension-type headache, intractable 12/01/2016  . Osteoarthritis of knee 11/12/2016  . Obesity, Class II, BMI 35-39.9 05/13/2016  . Hx of CABG 2010 11/12/2015  . Type 2 diabetes mellitus with circulatory disorder, without long-term current use of insulin ([place]) 11/12/2015  . Morbid obesity ([place]) 11/12/2015  . SOBOE (shortness of breath on exertion) 11/12/2015  . RBBB 11/12/2015  . ST elevation myocardial infarction (STEMI) of  inferior wall ([place]) 11/03/2015  . SVG-RCA throbectomy and DES 10/18/15   . Hypertensive heart disease without heart failure   . Vertigo   . Hyperlipidemia   . STEMI 10/18/15 10/18/2015  . Intractable chronic migraine without aura and without status migrainosus 05/08/2015  . Benign essential HTN 03/17/2015  . CAD (coronary artery disease) 08/26/2014  . Headache 08/06/2014    Past Surgical History:  Procedure Laterality Date  . ABDOMINAL HYSTERECTOMY    . CARDIAC CATHETERIZATION    . CARDIAC CATHETERIZATION N/A 10/18/2015   Procedure: Left Heart Cath and Cors/Grafts Angiography;  Surgeon: Lorretta Harp, MD;  Location: [facility];  Service: Cardiovascular;  Laterality: N/A;  . CARDIAC CATHETERIZATION N/A 10/18/2015   Procedure: Coronary Stent Intervention;  Surgeon: Lorretta Harp, MD;  Location: [facility];  Service: Cardiovascular;  Laterality: N/A;  . CARDIAC SURGERY    . CHOLECYSTECTOMY    . CORONARY ANGIOPLASTY    . CORONARY ARTERY BYPASS GRAFT     4 vessels - 2010  . CORONARY STENT INTERVENTION N/A 02/16/2017   Procedure: CORONARY STENT INTERVENTION;  Surgeon: Yolonda Kida, MD;  Location: [facility];  Service: Cardiovascular;  Laterality: N/A;  . LEFT HEART CATH AND CORONARY ANGIOGRAPHY Left 02/16/2017   Procedure: LEFT HEART CATH AND CORONARY ANGIOGRAPHY;  Surgeon: Corey Skains, MD;  Location: [facility];  Service: Cardiovascular;  Laterality: Left;    Prior to Admission medications   Medication Sig Start Date End  Date Taking? Authorizing Provider  albuterol (VENTOLIN HFA) 108 (90 Base) MCG/ACT inhaler Ventolin HFA 90 mcg/actuation aerosol inhaler    [provider]  amLODipine (NORVASC) 10 MG tablet Take 1 tablet (10 mg total) by mouth daily. 07/18/17   Ronnell Freshwater, NP  aspirin EC 81 MG tablet Take 81 mg by mouth daily.    [provider]  atorvastatin (LIPITOR) 80 MG tablet Take 1 tablet (80 mg total) by  mouth daily at 6 PM. Patient not taking: Reported on 03/10/2017 10/30/15   Lorretta Harp, MD  augmented betamethasone dipropionate (DIPROLENE-AF) 0.05 % cream Apply 1 application topically daily. Reported on 11/18/2015 01/09/15   [provider]  bisoprolol-hydrochlorothiazide (ZIAC) 2.5-6.25 MG tablet Take 1 tablet by mouth daily. 07/18/17   Ronnell Freshwater, NP  budesonide-formoterol (SYMBICORT) 160-4.5 MCG/ACT inhaler Symbicort 160 mcg-4.5 mcg/actuation HFA aerosol inhaler  Inhale 2 puffs twice a day by inhalation route.    [provider]  clopidogrel (PLAVIX) 75 MG tablet Take 75 mg by mouth daily.    [provider]  cyclobenzaprine (FLEXERIL) 10 MG tablet Take 10 mg by mouth at bedtime.  12/27/14   [provider]  estradiol (ESTRACE) 0.5 MG tablet Take 1 tablet (0.5 mg total) by mouth daily. 07/18/17   Ronnell Freshwater, NP  Fluticasone Propionate (FLONASE ALLERGY RELIEF NA) Flonase Allergy Relief    [provider]  furosemide (LASIX) 40 MG tablet Take 40 mg by mouth daily.  12/27/14   [provider]  glimepiride (AMARYL) 1 MG tablet Take 1 tablet (1 mg total) by mouth daily. With biggest meal 09/09/17   Ronnell Freshwater, NP  isosorbide mononitrate (IMDUR) 30 MG 24 hr tablet Take by mouth. 02/10/17 02/10/18  [provider]  losartan (COZAAR) 50 MG tablet Take 1 tablet (50 mg total) by mouth daily. 09/09/17   Ronnell Freshwater, NP  meclizine (ANTIVERT) 25 MG tablet Take 1 tablet (25 mg total) by mouth 3 (three) times daily as needed for dizziness. Patient not taking: Reported on 11/18/2015 10/22/15   Leanor Kail, PA  metFORMIN (GLUCOPHAGE) 500 MG tablet Take 250 mg by mouth 2 (two) times daily with a meal.     [provider]  montelukast (SINGULAIR) 10 MG tablet Take 1 tablet (10 mg total) by mouth daily. 07/18/17   Ronnell Freshwater, NP  nitroGLYCERIN (NITROSTAT) 0.4 MG SL tablet Place 0.4 mg under the tongue every 5  (five) minutes x 3 doses as needed for chest pain. *If no relief call MD or go to Emergency Room*    [provider]  omeprazole (PRILOSEC) 40 MG capsule Take 1 capsule (40 mg total) by mouth daily with supper. 07/29/17   Ronnell Freshwater, NP  oxybutynin (DITROPAN) 5 MG tablet Take 1 tablet (5 mg total) by mouth daily after supper. 09/09/17   Ronnell Freshwater, NP  ranitidine (ZANTAC) 150 MG tablet Take 1 tablet (150 mg total) by mouth 2 (two) times daily. 07/18/17   Ronnell Freshwater, NP  sertraline (ZOLOFT) 100 MG tablet Take 100 mg by mouth daily.  12/27/14   [provider]  zolpidem (AMBIEN) 10 MG tablet Take 10 mg by mouth at bedtime. 10/08/15   [provider]    Allergies Penicillin g and Penicillins  History reviewed. No pertinent family history.  Social History Social History   Tobacco Use  . Smoking status: Never Smoker  . Smokeless tobacco: Never Used  Substance Use  Topics  . Alcohol use: No    Alcohol/week: 0.0 oz  . Drug use: No    Review of Systems Constitutional: No fever/chills Eyes: No visual changes. ENT: No sore throat. Cardiovascular: Positive for chest pain. Respiratory: Positive for shortness of breath. Gastrointestinal: No abdominal pain.  No nausea, no vomiting.  No diarrhea.  No constipation. Genitourinary: Negative for dysuria. Musculoskeletal: Negative for back pain. Skin: Negative for rash. Neurological: Negative for headaches, focal weakness or numbness.   ____________________________________________   PHYSICAL EXAM:  VITAL SIGNS: ED Triage Vitals  Enc Vitals Group     BP 11/01/17 2257 (!) 185/99     Pulse Rate 11/01/17 2257 86     Resp 11/01/17 2257 (!) 24     Temp 11/01/17 2257 98.5 F (36.9 C)     Temp Source 11/01/17 2257 Oral     SpO2 11/01/17 2257 100 %     Weight 11/01/17 2258 200 lb (90.7 kg)     Height 11/01/17 2258 5' 7" (1.702 m)     Head Circumference --      Peak Flow --      Pain Score 11/01/17  2258 10     Pain Loc --      Pain Edu? --      Excl. in [place]? --     Constitutional: Alert and oriented x4 appears quite uncomfortable nontoxic no diaphoresis speaks full clear sentences Eyes: PERRL EOMI. Head: Atraumatic. Nose: No congestion/rhinnorhea. Mouth/Throat: No trismus Neck: No stridor.   Cardiovascular: Normal rate, regular rhythm. Grossly normal heart sounds.  Good peripheral circulation. Respiratory: Increased respiratory effort.  No retractions. Lungs CTAB and moving good air Gastrointestinal: Soft nontender Musculoskeletal: No lower extremity edema legs equal in size Neurologic:  Normal speech and language. No gross focal neurologic deficits are appreciated. Skin:  Skin is warm, dry and intact. No rash noted. Psychiatric: Mood and affect are normal. Speech and behavior are normal.    ____________________________________________   DIFFERENTIAL includes but not limited to  Acute coronary syndrome, pulmonary embolism, aortic dissection, pneumothorax ____________________________________________   LABS (all labs ordered are listed, but only abnormal results are displayed)  Labs Reviewed  BASIC METABOLIC PANEL - Abnormal; Notable for the following components:      Result Value   Glucose, Bld 175 (*)    All other components within normal limits  CBC - Abnormal; Notable for the following components:   WBC 15.2 (*)    Hemoglobin 11.9 (*)    MCV 77.3 (*)    MCH 25.9 (*)    RDW 17.6 (*)    Platelets 145 (*)    All other components within normal limits  GLUCOSE, CAPILLARY - Abnormal; Notable for the following components:   Glucose-Capillary 142 (*)    All other components within normal limits  TROPONIN I  HEPATIC FUNCTION PANEL  APTT  PROTIME-INR  BRAIN NATRIURETIC PEPTIDE  HIV ANTIBODY (ROUTINE TESTING)  TROPONIN I    Lab work reviewed by me with first troponin negative __________________________________________  EKG  ED ECG REPORT I, Darel Hong,  the attending physician, personally viewed and interpreted this ECG.  Date: 11/01/2017 EKG Time:  Rate: 68 Rhythm: normal sinus rhythm QRS Axis: normal Intervals: normal ST/T Wave abnormalities: normal Narrative Interpretation: no evidence of acute ischemia.  Unchanged from previous EKG 02/17/2017  ______________  RADIOLOGY  Chest x-ray reviewed by me with no acute disease ____________________________________________   PROCEDURES  Procedure(s) performed: no  Procedures  Critical Care performed:  no  Observation: no ____________________________________________   INITIAL IMPRESSION / ASSESSMENT AND PLAN / ED COURSE  Pertinent labs & imaging results that were available during my care of the patient were reviewed by me and considered in my medical decision making (see chart for details).  She arrives quite uncomfortable appearing.  Her chest pain is sharp and stabbing severe in her right upper chest and feels similar to previous heart attacks.  It was improved with triglycerides we will give her another dose now while chest x-ray and blood work are pending.  First EKG shows no change from previous.    ----------------------------------------- 12:05 AM on 11/02/2017 -----------------------------------------  The patient's chest pain is improved but not resolved.  First troponin is negative however I am concerned that this could represent unstable angina.  I placed her on a heparin drip and will admit her to the hospitalist.  ____________________________________________   FINAL CLINICAL IMPRESSION(S) / ED DIAGNOSES  Final diagnoses:  Unstable angina ([place])      NEW MEDICATIONS STARTED DURING THIS VISIT:  Current Discharge Medication List       Note:  This document was prepared using Dragon voice recognition software and may include unintentional dictation errors.     Darel Hong, MD 11/02/17 [phone]

## 2017-11-02 ENCOUNTER — Other Ambulatory Visit: Payer: Self-pay

## 2017-11-02 ENCOUNTER — Encounter: Payer: Self-pay | Admitting: Internal Medicine

## 2017-11-02 DIAGNOSIS — R0789 Other chest pain: Secondary | ICD-10-CM | POA: Diagnosis not present

## 2017-11-02 DIAGNOSIS — R079 Chest pain, unspecified: Secondary | ICD-10-CM | POA: Diagnosis not present

## 2017-11-02 DIAGNOSIS — E1165 Type 2 diabetes mellitus with hyperglycemia: Secondary | ICD-10-CM | POA: Diagnosis not present

## 2017-11-02 DIAGNOSIS — I25798 Atherosclerosis of other coronary artery bypass graft(s) with other forms of angina pectoris: Secondary | ICD-10-CM | POA: Diagnosis not present

## 2017-11-02 DIAGNOSIS — I200 Unstable angina: Secondary | ICD-10-CM | POA: Diagnosis not present

## 2017-11-02 DIAGNOSIS — D72829 Elevated white blood cell count, unspecified: Secondary | ICD-10-CM | POA: Diagnosis not present

## 2017-11-02 LAB — BRAIN NATRIURETIC PEPTIDE: B Natriuretic Peptide: 44 pg/mL (ref 0.0–100.0)

## 2017-11-02 LAB — HEPARIN LEVEL (UNFRACTIONATED)
HEPARIN UNFRACTIONATED: 0.24 [IU]/mL — AB (ref 0.30–0.70)
HEPARIN UNFRACTIONATED: 0.31 [IU]/mL (ref 0.30–0.70)

## 2017-11-02 LAB — PROTIME-INR
INR: 1.13
Prothrombin Time: 14.4 seconds (ref 11.4–15.2)

## 2017-11-02 LAB — APTT: APTT: 27 s (ref 24–36)

## 2017-11-02 LAB — GLUCOSE, CAPILLARY
GLUCOSE-CAPILLARY: 100 mg/dL — AB (ref 65–99)
GLUCOSE-CAPILLARY: 115 mg/dL — AB (ref 65–99)
Glucose-Capillary: 142 mg/dL — ABNORMAL HIGH (ref 65–99)
Glucose-Capillary: 137 mg/dL — ABNORMAL HIGH (ref 65–99)
Glucose-Capillary: 134 mg/dL — ABNORMAL HIGH (ref 65–99)

## 2017-11-02 LAB — TROPONIN I: Troponin I: <0.03 ng/mL (ref <0.03)

## 2017-11-02 MED ORDER — ESTRADIOL 1 MG PO TABS
0.5000 mg | ORAL_TABLET | Freq: Every day | ORAL | Status: DC
  Administered 2017-11-02 – 2017-11-03 (×2): 0.5 mg via ORAL
  Filled 2017-11-02 (×2): qty 0.5

## 2017-11-02 MED ORDER — CLOPIDOGREL BISULFATE 75 MG PO TABS
75.0000 mg | ORAL_TABLET | Freq: Every day | ORAL | Status: DC
  Administered 2017-11-02 – 2017-11-03 (×2): 75 mg via ORAL
  Filled 2017-11-02 (×2): qty 1

## 2017-11-02 MED ORDER — LOSARTAN POTASSIUM 50 MG PO TABS
50.0000 mg | ORAL_TABLET | Freq: Every day | ORAL | Status: DC
  Administered 2017-11-02 – 2017-11-03 (×2): 50 mg via ORAL
  Filled 2017-11-02 (×2): qty 1

## 2017-11-02 MED ORDER — FUROSEMIDE 40 MG PO TABS
40.0000 mg | ORAL_TABLET | Freq: Every day | ORAL | Status: DC
  Administered 2017-11-02 – 2017-11-03 (×2): 40 mg via ORAL
  Filled 2017-11-02 (×2): qty 1

## 2017-11-02 MED ORDER — ATORVASTATIN CALCIUM 20 MG PO TABS
80.0000 mg | ORAL_TABLET | Freq: Every day | ORAL | Status: DC
  Administered 2017-11-02: 80 mg via ORAL
  Filled 2017-11-02: qty 4

## 2017-11-02 MED ORDER — SODIUM CHLORIDE 0.9% FLUSH
3.0000 mL | Freq: Two times a day (BID) | INTRAVENOUS | Status: DC
  Administered 2017-11-02 – 2017-11-03 (×2): 3 mL via INTRAVENOUS

## 2017-11-02 MED ORDER — MONTELUKAST SODIUM 10 MG PO TABS
10.0000 mg | ORAL_TABLET | Freq: Every day | ORAL | Status: DC
  Administered 2017-11-02 – 2017-11-03 (×2): 10 mg via ORAL
  Filled 2017-11-02 (×2): qty 1

## 2017-11-02 MED ORDER — OXYBUTYNIN CHLORIDE 5 MG PO TABS
5.0000 mg | ORAL_TABLET | Freq: Every day | ORAL | Status: DC
  Administered 2017-11-02: 5 mg via ORAL
  Filled 2017-11-02: qty 1

## 2017-11-02 MED ORDER — MORPHINE SULFATE (PF) 4 MG/ML IV SOLN
4.0000 mg | INTRAVENOUS | Status: DC | PRN
  Administered 2017-11-02 (×2): 4 mg via INTRAVENOUS
  Filled 2017-11-02 (×2): qty 1

## 2017-11-02 MED ORDER — ASPIRIN EC 81 MG PO TBEC
81.0000 mg | DELAYED_RELEASE_TABLET | Freq: Every day | ORAL | Status: DC
  Administered 2017-11-03: 81 mg via ORAL
  Filled 2017-11-02: qty 1

## 2017-11-02 MED ORDER — PROMETHAZINE HCL 25 MG/ML IJ SOLN
12.5000 mg | Freq: Four times a day (QID) | INTRAMUSCULAR | Status: DC | PRN
  Administered 2017-11-02: 12.5 mg via INTRAVENOUS
  Filled 2017-11-02: qty 1

## 2017-11-02 MED ORDER — SERTRALINE HCL 50 MG PO TABS
100.0000 mg | ORAL_TABLET | Freq: Every day | ORAL | Status: DC
  Administered 2017-11-02 – 2017-11-03 (×2): 100 mg via ORAL
  Filled 2017-11-02 (×2): qty 2

## 2017-11-02 MED ORDER — HEPARIN (PORCINE) IN NACL 100-0.45 UNIT/ML-% IJ SOLN
1250.0000 [IU]/h | INTRAMUSCULAR | Status: DC
  Administered 2017-11-02: 1250 [IU]/h via INTRAVENOUS
  Administered 2017-11-02: 1000 [IU]/h via INTRAVENOUS
  Filled 2017-11-02 (×2): qty 250

## 2017-11-02 MED ORDER — BUTALBITAL-APAP-CAFFEINE 50-325-40 MG PO TABS
1.0000 | ORAL_TABLET | Freq: Four times a day (QID) | ORAL | Status: DC | PRN
  Administered 2017-11-02: 1 via ORAL
  Filled 2017-11-02 (×2): qty 1

## 2017-11-02 MED ORDER — ISOSORBIDE MONONITRATE ER 30 MG PO TB24
30.0000 mg | ORAL_TABLET | Freq: Every day | ORAL | Status: DC
  Administered 2017-11-02 – 2017-11-03 (×2): 30 mg via ORAL
  Filled 2017-11-02 (×2): qty 1

## 2017-11-02 MED ORDER — BISACODYL 5 MG PO TBEC: 5.0000 mg | DELAYED_RELEASE_TABLET | Freq: Every day | ORAL | Status: DC | PRN

## 2017-11-02 MED ORDER — ZOLPIDEM TARTRATE 5 MG PO TABS
10.0000 mg | ORAL_TABLET | Freq: Every day | ORAL | Status: DC
  Administered 2017-11-02: 10 mg via ORAL
  Filled 2017-11-02: qty 2

## 2017-11-02 MED ORDER — SENNOSIDES-DOCUSATE SODIUM 8.6-50 MG PO TABS: 1.0000 | ORAL_TABLET | Freq: Every evening | ORAL | Status: DC | PRN

## 2017-11-02 MED ORDER — MECLIZINE HCL 25 MG PO TABS
25.0000 mg | ORAL_TABLET | Freq: Three times a day (TID) | ORAL | Status: DC | PRN
  Filled 2017-11-02: qty 1

## 2017-11-02 MED ORDER — INSULIN ASPART 100 UNIT/ML ~~LOC~~ SOLN
0.0000 [IU] | Freq: Three times a day (TID) | SUBCUTANEOUS | Status: DC
  Administered 2017-11-02: 0 [IU] via SUBCUTANEOUS

## 2017-11-02 MED ORDER — BUDESONIDE 0.25 MG/2ML IN SUSP
0.2500 mg | Freq: Two times a day (BID) | RESPIRATORY_TRACT | Status: DC
  Administered 2017-11-02 – 2017-11-03 (×3): 0.25 mg via RESPIRATORY_TRACT
  Filled 2017-11-02 (×3): qty 2

## 2017-11-02 MED ORDER — NITROGLYCERIN 0.4 MG SL SUBL: 0.4000 mg | SUBLINGUAL_TABLET | SUBLINGUAL | Status: DC | PRN

## 2017-11-02 MED ORDER — ONDANSETRON HCL 4 MG/2ML IJ SOLN
4.0000 mg | Freq: Four times a day (QID) | INTRAMUSCULAR | Status: DC | PRN
  Administered 2017-11-02 (×2): 4 mg via INTRAVENOUS
  Filled 2017-11-02 (×2): qty 2

## 2017-11-02 MED ORDER — BISOPROLOL-HYDROCHLOROTHIAZIDE 2.5-6.25 MG PO TABS
1.0000 | ORAL_TABLET | Freq: Every day | ORAL | Status: DC
  Administered 2017-11-02: 1 via ORAL
  Filled 2017-11-02 (×2): qty 1

## 2017-11-02 MED ORDER — ALBUTEROL SULFATE (2.5 MG/3ML) 0.083% IN NEBU: 2.5000 mg | INHALATION_SOLUTION | Freq: Four times a day (QID) | RESPIRATORY_TRACT | Status: DC | PRN

## 2017-11-02 MED ORDER — HEPARIN BOLUS VIA INFUSION
4000.0000 [IU] | Freq: Once | INTRAVENOUS | Status: AC
  Administered 2017-11-02: 4000 [IU] via INTRAVENOUS
  Filled 2017-11-02: qty 4000

## 2017-11-02 MED ORDER — MOMETASONE FURO-FORMOTEROL FUM 200-5 MCG/ACT IN AERO
2.0000 | INHALATION_SPRAY | Freq: Two times a day (BID) | RESPIRATORY_TRACT | Status: DC
  Filled 2017-11-02: qty 8.8

## 2017-11-02 MED ORDER — AMLODIPINE BESYLATE 10 MG PO TABS
10.0000 mg | ORAL_TABLET | Freq: Every day | ORAL | Status: DC
  Administered 2017-11-02 – 2017-11-03 (×2): 10 mg via ORAL
  Filled 2017-11-02 (×2): qty 1

## 2017-11-02 MED ORDER — CYCLOBENZAPRINE HCL 10 MG PO TABS
10.0000 mg | ORAL_TABLET | Freq: Every day | ORAL | Status: DC
  Administered 2017-11-02: 10 mg via ORAL
  Filled 2017-11-02: qty 1

## 2017-11-02 MED ORDER — ACETAMINOPHEN 325 MG PO TABS
650.0000 mg | ORAL_TABLET | ORAL | Status: DC | PRN
  Administered 2017-11-02: 650 mg via ORAL
  Filled 2017-11-02: qty 2

## 2017-11-02 MED ORDER — ARFORMOTEROL TARTRATE 15 MCG/2ML IN NEBU
15.0000 ug | INHALATION_SOLUTION | Freq: Two times a day (BID) | RESPIRATORY_TRACT | Status: DC
  Administered 2017-11-02 – 2017-11-03 (×2): 15 ug via RESPIRATORY_TRACT
  Filled 2017-11-02 (×4): qty 2

## 2017-11-02 MED ORDER — MORPHINE SULFATE (PF) 2 MG/ML IV SOLN: 2.0000 mg | INTRAVENOUS | Status: DC | PRN

## 2017-11-02 MED ORDER — FAMOTIDINE 20 MG PO TABS
20.0000 mg | ORAL_TABLET | Freq: Every day | ORAL | Status: DC
  Administered 2017-11-02 – 2017-11-03 (×2): 20 mg via ORAL
  Filled 2017-11-02 (×2): qty 1

## 2017-11-02 MED ORDER — PANTOPRAZOLE SODIUM 40 MG PO TBEC
40.0000 mg | DELAYED_RELEASE_TABLET | Freq: Every day | ORAL | Status: DC
  Administered 2017-11-02 – 2017-11-03 (×2): 40 mg via ORAL
  Filled 2017-11-02 (×2): qty 1

## 2017-11-02 NOTE — Progress Notes (Signed)
ANTICOAGULATION CONSULT NOTE - Initial Consult  Pharmacy Consult for heparin drip Indication: chest pain/ACS  Allergies  Allergen Reactions  . Penicillin G Anaphylaxis and Other (See Comments)    Has patient had a PCN reaction causing immediate rash, facial/tongue/throat swelling, SOB or lightheadedness with hypotension: BXU:[id number]} Has patient had a PCN reaction causing severe rash involving mucus membranes or skin necrosis: no:[id number]} Has patient had a PCN reaction that required hospitalization no:[id number]} Has patient had a PCN reaction occurring within the last 10 years: no:[id number]} If all of the above answers are "NO", then may proceed with Cephalosporin use.  Marland Kitchen Penicillins     Patient Measurements: Height: _0  (170.2 cm) Weight: 200 lb (90.7 kg) IBW/kg (Calculated) : 61.6 Heparin Dosing Weight: 81 kg  Vital Signs: Temp: 98.5 F (36.9 C) (04/30 2257) Temp Source: Oral (04/30 2257) BP: 142/88 (05/01 0000) Pulse Rate: 72 (05/01 0000)  Labs: Recent Labs    11/01/17 2300  HGB 11.9*  HCT 35.7  PLT 145*  CREATININE 0.73  TROPONINI <0.03    Estimated Creatinine Clearance: 86.4 mL/min (by C-G formula based on SCr of 0.73 mg/dL).   Medical History: Past Medical History:  Diagnosis Date  . Asthma   . Coronary artery disease   . Diabetes mellitus without complication ([place])   . Hyperlipidemia   . Hypertension   . MI (myocardial infarction) ([place])   . Migraine headache with aura   . 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    Medications:  No anticoagulation in PTA meds  Assessment: Trop <0.03 initially  Goal of Therapy:  Heparin level 0.3-0.7 units/ml Monitor platelets by anticoagulation protocol: Yes   Plan:  4000 unit bolus and initial rate of 1000 units/hr. First heparin level 6 hours after start of infusion.  Siddalee Vanderheiden S 11/02/2017,12:35 AM

## 2017-11-02 NOTE — Progress Notes (Signed)
Admitted this morning for chest pain, patient has a history of CAD, CABG, multiple stents.  Troponins have been negative x3.  Waiting for cardiology consult.  Continue heparin drip, aspirin, beta-blockers, plavix, Waiting for cardio input,to see if she nedes cardiac work up.has headache and nausea with vomiting.use Fioricet and zofran. Time spent ;20 min

## 2017-11-02 NOTE — Consult Note (Signed)
[facility] Cardiology  CARDIOLOGY CONSULT NOTE  Patient ID: Felicia Woods MRN: [id number] DOB/AGE: 61/29/1957 61 y.o.  Admit date: 11/01/2017 Referring Physician Jasmine Pang Primary Physician [facility] Cardiologist Nehemiah Massed Reason for Consultation chest pain  HPI: 62 year old female referred for evaluation of chest pain.  The patient has known coronary disease, status post CABG times 10/2008, inferior STEMI with primary PCI and DES SVG to RCA 10/18/2015, and DES SVG to OM 28/15/2018.  Patient presents to [facility] emergency room with sided chest pain, sharp in nature, unrelieved after 2 sublingual nitroglycerin.  EKG was nondiagnostic, revealing sinus rhythm with right bundle branch block without acute ischemic ST-T wave changes.  The patient was ruled out for myocardial infarction with negative troponin less than 0.032.  Patient currently denies chest pain or shortness of breath.  2D echo cardiogram 12/31/2016 revealed LVEF of 50%.  Lexiscan Myoview 01/11/2017 did not reveal evidence for scar or ischemia.  Review of systems complete and found to be negative unless listed above     Past Medical History:  Diagnosis Date  . Asthma   . Coronary artery disease   . Diabetes mellitus without complication ([place])   . Hyperlipidemia   . Hypertension   . MI (myocardial infarction) ([place])   . Migraine headache with aura   . 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    Past Surgical History:  Procedure Laterality Date  . ABDOMINAL HYSTERECTOMY    . CARDIAC CATHETERIZATION    . CARDIAC CATHETERIZATION N/A 10/18/2015   Procedure: Left Heart Cath and Cors/Grafts Angiography;  Surgeon: Lorretta Harp, MD;  Location: [place] [facility];  Service: Cardiovascular;  Laterality: N/A;  . CARDIAC CATHETERIZATION N/A 10/18/2015   Procedure: Coronary Stent Intervention;  Surgeon: Lorretta Harp, MD;  Location: [place] [facility];  Service: Cardiovascular;  Laterality: N/A;  . CARDIAC SURGERY    . CHOLECYSTECTOMY    .  CORONARY ANGIOPLASTY    . CORONARY ARTERY BYPASS GRAFT     4 vessels - 2010  . CORONARY STENT INTERVENTION N/A 02/16/2017   Procedure: CORONARY STENT INTERVENTION;  Surgeon: Yolonda Kida, MD;  Location: [place] [facility];  Service: Cardiovascular;  Laterality: N/A;  . LEFT HEART CATH AND CORONARY ANGIOGRAPHY Left 02/16/2017   Procedure: LEFT HEART CATH AND CORONARY ANGIOGRAPHY;  Surgeon: Corey Skains, MD;  Location: [place] [facility];  Service: Cardiovascular;  Laterality: Left;    Medications Prior to Admission  Medication Sig Dispense Refill Last Dose  . amLODipine (NORVASC) 10 MG tablet Take 1 tablet (10 mg total) by mouth daily. 30 tablet 5 Unknown at Unknown  . aspirin EC 81 MG tablet Take 81 mg by mouth daily.   Unknown at Unknown  . bisoprolol-hydrochlorothiazide (ZIAC) 2.5-6.25 MG tablet Take 1 tablet by mouth daily. 30 tablet 5 Unknown at Unknown  . clopidogrel (PLAVIX) 75 MG tablet Take 75 mg by mouth daily.   Unknown at Unknown  . cyclobenzaprine (FLEXERIL) 10 MG tablet Take 10 mg by mouth 2 (two) times daily as needed for muscle spasms.    PRN at PRN  . estradiol (ESTRACE) 0.5 MG tablet Take 1 tablet (0.5 mg total) by mouth daily. 30 tablet 5 Unknown at Unknown  . furosemide (LASIX) 40 MG tablet Take 40 mg by mouth 2 (two) times daily.    Unknown at Unknown  . glimepiride (AMARYL) 1 MG tablet Take 1 tablet (1 mg total) by mouth daily. With biggest meal 90 tablet 1 Unknown at  Unknown  . losartan (COZAAR) 50 MG tablet Take 1 tablet (50 mg total) by mouth daily. 90 tablet 1 Unknown at Unknown  . montelukast (SINGULAIR) 10 MG tablet Take 1 tablet (10 mg total) by mouth daily. 30 tablet 5 Unknown at Unknown  . nitroGLYCERIN (NITROSTAT) 0.4 MG SL tablet Place 0.4 mg under the tongue every 5 (five) minutes x 3 doses as needed for chest pain. *If no relief call MD or go to Emergency Room*   PRN at PRN  . oxybutynin (DITROPAN) 5 MG tablet Take 1 tablet (5 mg total) by mouth  daily after supper. 90 tablet 1 Unknown at Unknown  . ranitidine (ZANTAC) 150 MG tablet Take 1 tablet (150 mg total) by mouth 2 (two) times daily. 60 tablet 5 Unknown at Unknown  . sertraline (ZOLOFT) 100 MG tablet Take 100 mg by mouth daily.    Unknown at Unknown  . simvastatin (ZOCOR) 40 MG tablet Take 40 mg by mouth daily.   Unknown at Unknown  . zolpidem (AMBIEN) 10 MG tablet Take 10 mg by mouth at bedtime.   Unknown at Unknown  . atorvastatin (LIPITOR) 80 MG tablet Take 1 tablet (80 mg total) by mouth daily at 6 PM. (Patient not taking: Reported on 03/10/2017) 30 tablet 3 Not Taking at Unknown time  . meclizine (ANTIVERT) 25 MG tablet Take 1 tablet (25 mg total) by mouth 3 (three) times daily as needed for dizziness. (Patient not taking: Reported on 11/18/2015) 30 tablet 0 Not Taking at Unknown time  . omeprazole (PRILOSEC) 40 MG capsule Take 1 capsule (40 mg total) by mouth daily with supper. (Patient not taking: Reported on 11/02/2017) 30 capsule 3 Not Taking at Unknown time   Social History   Socioeconomic History  . Marital status: Single    Spouse name: Not on file  . Number of children: Not on file  . Years of education: Not on file  . Highest education level: Not on file  Occupational History  . Not on file  Social Needs  . Financial resource strain: Not on file  . Food insecurity:    Worry: Not on file    Inability: Not on file  . Transportation needs:    Medical: Not on file    Non-medical: Not on file  Tobacco Use  . Smoking status: Never Smoker  . Smokeless tobacco: Never Used  Substance and Sexual Activity  . Alcohol use: No    Alcohol/week: 0.0 oz  . Drug use: No  . Sexual activity: Yes  Lifestyle  . Physical activity:    Days per week: Not on file    Minutes per session: Not on file  . Stress: Not on file  Relationships  . Social connections:    Talks on phone: Not on file    Gets together: Not on file    Attends religious service: Not on file    Active  member of club or organization: Not on file    Attends meetings of clubs or organizations: Not on file    Relationship status: Not on file  . Intimate partner violence:    Fear of current or ex partner: Not on file    Emotionally abused: Not on file    Physically abused: Not on file    Forced sexual activity: Not on file  Other Topics Concern  . Not on file  Social History Narrative  . Not on file    History reviewed. No pertinent family history.  Review of systems complete and found to be negative unless listed above      PHYSICAL EXAM  General: Well developed, well nourished, in no acute distress HEENT:  Normocephalic and atramatic Neck:  No JVD.  Lungs: Clear bilaterally to auscultation and percussion. Heart: HRRR . Normal S1 and S2 without gallops or murmurs.  Abdomen: Bowel sounds are positive, abdomen soft and non-tender  Msk:  Back normal, normal gait. Normal strength and tone for age. Extremities: No clubbing, cyanosis or edema.   Neuro: Alert and oriented X 3. Psych:  Good affect, responds appropriately  Labs:   Lab Results  Component Value Date   WBC 15.2 (H) 11/01/2017   HGB 11.9 (L) 11/01/2017   HCT 35.7 11/01/2017   MCV 77.3 (L) 11/01/2017   PLT 145 (L) 11/01/2017    Recent Labs  Lab 11/01/17 2300  NA 136  K 4.8  CL 103  CO2 25  BUN 14  CREATININE 0.73  CALCIUM 9.1  PROT 7.8  BILITOT 0.6  ALKPHOS 78  ALT 19  AST 28  GLUCOSE 175*   Lab Results  Component Value Date   TROPONINI <0.03 11/02/2017    Lab Results  Component Value Date   CHOL 153 10/20/2015   Lab Results  Component Value Date   HDL 31 (L) 10/20/2015   Lab Results  Component Value Date   LDLCALC 97 10/20/2015   Lab Results  Component Value Date   TRIG 123 10/20/2015   Lab Results  Component Value Date   CHOLHDL 4.9 10/20/2015   No results found for: LDLDIRECT    Radiology: Dg Chest Port 1 View  Result Date: 11/01/2017 CLINICAL DATA:  Chest pain, shortness  of breath EXAM: PORTABLE CHEST 1 VIEW COMPARISON:  10/18/2015 FINDINGS: Lungs are clear.  No pleural effusion or pneumothorax. The heart is normal in size. Postsurgical changes related to prior CABG. Median sternotomy. IMPRESSION: No evidence of acute cardiopulmonary disease. Electronically Signed   By: Julian Hy M.D.   On: 11/01/2017 23:34    EKG: Sinus rhythm with right bundle branch block  ASSESSMENT AND PLAN:   1.  Chest pain, with atypical features, negative troponin, nondiagnostic ECG 2.  Known CAD, status post CABG x4, status post inferior STEMI and DES SVG to RCA 10/18/2015, status post DES SVG to OM 28/15/2018 3.  Essential hypertension 4.  Hyperlipidemia  Recommendations  1.  Agree with overall current therapy 2.  Lexiscan Myoview in the a.m.  Signed: Isaias Cowman MD,PhD, [facility] 11/02/2017, 5:12 PM

## 2017-11-02 NOTE — H&P (Addendum)
[facility] NAME: Felicia Woods    MR#:  [id number]  DATE OF BIRTH:  06/03/1956  DATE OF ADMISSION:  11/01/2017  PRIMARY CARE PHYSICIAN: Lavera Guise, MD   REQUESTING/REFERRING PHYSICIAN: Darel Hong, MD  CHIEF COMPLAINT:   Chief Complaint  Patient presents with  . Chest Pain  . Shortness of Breath    HISTORY OF PRESENT ILLNESS:  Felicia Woods  is a 62 y.o. female with a known history of obesity, T2NIDDM, HTN, HLD, CAD/MI (s/p CABG x4, 2010; stent 2017; stent 2018), chronic diastolic CHF/HFpEF, RBBB, asthma, GERD, migraine, DJD/OA who p/w 1d Hx CP. Pt states that she went to bed @~2130-2145 on Tuesday (11/01/2017) evening. She states she had not yet fallen asleep, and was lying in bed, when she developed sudden acute-onset CP @~2200-2215PM. She characterizes the pain as severe, sharp, R-sided, non-pleuritic, non-positional and non-reproducible, w/ associated SOB, nausea and chills. She states the pain would radiate to her midback, between her shoulder blades, on deep inspiration. She does not know if the pain is exertional, as she stayed in bed. She denies vomiting, diaphoresis, LH/LOC. She denies radiation of pain to the arm, shoulder or jaw. She states she took NTG x2, w/ no improvement in symptoms. She became worried and asked her fiance to drive her to the hospital. She states the pain was very similar in character to the pain she had previously experienced in association w/ CAD/MI necessitating CABG/stent. Pt denies fever, night sweats, rigors, diarrhea, AP, palpitations, cough, hemoptysis, wheezing, HA, vertigo, blurred vision, urinary symptoms. She is pain-free at the time of my Hx/examination (s/p medication), and is in no acute distress. Her Cardiologist is Dr. Nehemiah Massed, whom I believe the pt last saw in 05/2017.  PAST MEDICAL HISTORY:   Past Medical History:  Diagnosis Date  . Asthma   . Coronary artery disease   .  Diabetes mellitus without complication ([place])   . Hyperlipidemia   . Hypertension   . MI (myocardial infarction) ([place])   . Migraine headache with aura   . 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    PAST SURGICAL HISTORY:   Past Surgical History:  Procedure Laterality Date  . ABDOMINAL HYSTERECTOMY    . CARDIAC CATHETERIZATION    . CARDIAC CATHETERIZATION N/A 10/18/2015   Procedure: Left Heart Cath and Cors/Grafts Angiography;  Surgeon: Lorretta Harp, MD;  Location: [place] [facility];  Service: Cardiovascular;  Laterality: N/A;  . CARDIAC CATHETERIZATION N/A 10/18/2015   Procedure: Coronary Stent Intervention;  Surgeon: Lorretta Harp, MD;  Location: [place] [facility];  Service: Cardiovascular;  Laterality: N/A;  . CARDIAC SURGERY    . CHOLECYSTECTOMY    . CORONARY ANGIOPLASTY    . CORONARY ARTERY BYPASS GRAFT     4 vessels - 2010  . CORONARY STENT INTERVENTION N/A 02/16/2017   Procedure: CORONARY STENT INTERVENTION;  Surgeon: Yolonda Kida, MD;  Location: [place] [facility];  Service: Cardiovascular;  Laterality: N/A;  . LEFT HEART CATH AND CORONARY ANGIOGRAPHY Left 02/16/2017   Procedure: LEFT HEART CATH AND CORONARY ANGIOGRAPHY;  Surgeon: Corey Skains, MD;  Location: [place] [facility];  Service: Cardiovascular;  Laterality: Left;    SOCIAL HISTORY:   Social History   Tobacco Use  . Smoking status: Never Smoker  . Smokeless tobacco: Never Used  Substance Use Topics  . Alcohol use: No    Alcohol/week: 0.0 oz  FAMILY HISTORY:  History reviewed. No pertinent family history.  DRUG ALLERGIES:   Allergies  Allergen Reactions  . Penicillin G Anaphylaxis and Other (See Comments)    Has patient had a PCN reaction causing immediate rash, facial/tongue/throat swelling, SOB or lightheadedness with hypotension: EUM:[id number]} Has patient had a PCN reaction causing severe rash involving mucus membranes or skin necrosis: no:[id number]} Has patient had a PCN reaction  that required hospitalization no:[id number]} Has patient had a PCN reaction occurring within the last 10 years: no:[id number]} If all of the above answers are "NO", then may proceed with Cephalosporin use.  Marland Kitchen Penicillins     REVIEW OF SYSTEMS:   Review of Systems  Constitutional: Positive for chills. Negative for diaphoresis, fever, malaise/fatigue and weight loss.  HENT: Negative for congestion, hearing loss, nosebleeds, sinus pain, sore throat and tinnitus.   Eyes: Negative for blurred vision, double vision and photophobia.  Respiratory: Positive for shortness of breath. Negative for cough, hemoptysis, sputum production and wheezing.   Cardiovascular: Positive for chest pain. Negative for palpitations, orthopnea, claudication, leg swelling and PND.  Gastrointestinal: Positive for nausea. Negative for abdominal pain, blood in stool, constipation, diarrhea, heartburn, melena and vomiting.  Genitourinary: Negative for dysuria, flank pain, frequency, hematuria and urgency.  Musculoskeletal: Positive for back pain. Negative for falls, joint pain, myalgias and neck pain.  Skin: Negative for itching and rash.  Neurological: Negative for dizziness, tingling, tremors, sensory change, speech change, focal weakness, seizures, loss of consciousness, weakness and headaches.    MEDICATIONS AT HOME:   Prior to Admission medications   Medication Sig Start Date End Date Taking? Authorizing Provider  albuterol (VENTOLIN HFA) 108 (90 Base) MCG/ACT inhaler Ventolin HFA 90 mcg/actuation aerosol inhaler    [provider]  amLODipine (NORVASC) 10 MG tablet Take 1 tablet (10 mg total) by mouth daily. 07/18/17   Ronnell Freshwater, NP  aspirin EC 81 MG tablet Take 81 mg by mouth daily.    [provider]  atorvastatin (LIPITOR) 80 MG tablet Take 1 tablet (80 mg total) by mouth daily at 6 PM. Patient not taking: Reported on 03/10/2017 10/30/15   Lorretta Harp, MD  augmented betamethasone  dipropionate (DIPROLENE-AF) 0.05 % cream Apply 1 application topically daily. Reported on 11/18/2015 01/09/15   [provider]  bisoprolol-hydrochlorothiazide (ZIAC) 2.5-6.25 MG tablet Take 1 tablet by mouth daily. 07/18/17   Ronnell Freshwater, NP  budesonide-formoterol (SYMBICORT) 160-4.5 MCG/ACT inhaler Symbicort 160 mcg-4.5 mcg/actuation HFA aerosol inhaler  Inhale 2 puffs twice a day by inhalation route.    [provider]  clopidogrel (PLAVIX) 75 MG tablet Take 75 mg by mouth daily.    [provider]  cyclobenzaprine (FLEXERIL) 10 MG tablet Take 10 mg by mouth at bedtime.  12/27/14   [provider]  estradiol (ESTRACE) 0.5 MG tablet Take 1 tablet (0.5 mg total) by mouth daily. 07/18/17   Ronnell Freshwater, NP  Fluticasone Propionate (FLONASE ALLERGY RELIEF NA) Flonase Allergy Relief    [provider]  furosemide (LASIX) 40 MG tablet Take 40 mg by mouth daily.  12/27/14   [provider]  glimepiride (AMARYL) 1 MG tablet Take 1 tablet (1 mg total) by mouth daily. With biggest meal 09/09/17   Ronnell Freshwater, NP  isosorbide mononitrate (IMDUR) 30 MG 24 hr tablet Take by mouth. 02/10/17 02/10/18  [provider]  losartan (COZAAR) 50 MG tablet Take 1 tablet (50 mg total) by mouth daily. 09/09/17   Leretha Pol  E, NP  meclizine (ANTIVERT) 25 MG tablet Take 1 tablet (25 mg total) by mouth 3 (three) times daily as needed for dizziness. Patient not taking: Reported on 11/18/2015 10/22/15   Leanor Kail, PA  metFORMIN (GLUCOPHAGE) 500 MG tablet Take 250 mg by mouth 2 (two) times daily with a meal.     [provider]  montelukast (SINGULAIR) 10 MG tablet Take 1 tablet (10 mg total) by mouth daily. 07/18/17   Ronnell Freshwater, NP  nitroGLYCERIN (NITROSTAT) 0.4 MG SL tablet Place 0.4 mg under the tongue every 5 (five) minutes x 3 doses as needed for chest pain. *If no relief call MD or go to Emergency Room*    [provider]    omeprazole (PRILOSEC) 40 MG capsule Take 1 capsule (40 mg total) by mouth daily with supper. 07/29/17   Ronnell Freshwater, NP  oxybutynin (DITROPAN) 5 MG tablet Take 1 tablet (5 mg total) by mouth daily after supper. 09/09/17   Ronnell Freshwater, NP  ranitidine (ZANTAC) 150 MG tablet Take 1 tablet (150 mg total) by mouth 2 (two) times daily. 07/18/17   Ronnell Freshwater, NP  sertraline (ZOLOFT) 100 MG tablet Take 100 mg by mouth daily.  12/27/14   [provider]  zolpidem (AMBIEN) 10 MG tablet Take 10 mg by mouth at bedtime. 10/08/15   [provider]      VITAL SIGNS:  Blood pressure (!) 142/88, pulse 72, temperature 98.5 F (36.9 C), temperature source Oral, resp. rate 20, height _0  (1.702 m), weight 90.7 kg (200 lb), SpO2 98 %.  PHYSICAL EXAMINATION:  Physical Exam  Constitutional: She is oriented to person, place, and time. She appears well-developed and well-nourished. She is active and cooperative.  Non-toxic appearance. She does not have a sickly appearance. She does not appear ill. No distress.  HENT:  Head: Normocephalic and atraumatic.  Mouth/Throat: No oropharyngeal exudate or posterior oropharyngeal edema.  Eyes: Pupils are equal, round, and reactive to light. Conjunctivae, EOM and lids are normal. No scleral icterus.  Neck: Neck supple. No JVD present. No thyromegaly present.  Cardiovascular: Normal rate, regular rhythm, S1 normal and S2 normal.  No extrasystoles are present. Exam reveals no gallop, no S3, no S4, no distant heart sounds and no friction rub.  Murmur heard.  Systolic murmur is present with a grade of 3/6.  No diastolic murmur is present. Pulmonary/Chest: Effort normal and breath sounds normal. No accessory muscle usage or stridor. No apnea, no tachypnea and no bradypnea. No respiratory distress. She has no decreased breath sounds. She has no wheezes. She has no rhonchi. She has no rales.  Abdominal: Soft. Bowel sounds are normal. She exhibits no  distension. There is no tenderness. There is no rebound and no guarding.  Musculoskeletal: Normal range of motion. She exhibits no edema.       Right lower leg: Normal. She exhibits no edema.       Left lower leg: Normal. She exhibits no edema.  Lymphadenopathy:    She has no cervical adenopathy.  Neurological: She is alert and oriented to person, place, and time. She is not disoriented.  Skin: Skin is warm, dry and intact. No rash noted. She is not diaphoretic. No erythema.  Psychiatric: She has a normal mood and affect. Her speech is normal and behavior is normal. Judgment and thought content normal. Her mood appears not anxious. She is not agitated. Cognition and memory are normal.   LABORATORY PANEL:  CBC Recent Labs  Lab 11/01/17 2300  WBC 15.2*  HGB 11.9*  HCT 35.7  PLT 145*   ------------------------------------------------------------------------------------------------------------------  Chemistries  Recent Labs  Lab 11/01/17 2300  NA 136  K 4.8  CL 103  CO2 25  GLUCOSE 175*  BUN 14  CREATININE 0.73  CALCIUM 9.1  AST 28  ALT 19  ALKPHOS 78  BILITOT 0.6   ------------------------------------------------------------------------------------------------------------------  Cardiac Enzymes Recent Labs  Lab 11/01/17 2300  TROPONINI <0.03   ------------------------------------------------------------------------------------------------------------------  RADIOLOGY:  Dg Chest Port 1 View  Result Date: 11/01/2017 CLINICAL DATA:  Chest pain, shortness of breath EXAM: PORTABLE CHEST 1 VIEW COMPARISON:  10/18/2015 FINDINGS: Lungs are clear.  No pleural effusion or pneumothorax. The heart is normal in size. Postsurgical changes related to prior CABG. Median sternotomy. IMPRESSION: No evidence of acute cardiopulmonary disease. Electronically Signed   By: Julian Hy M.D.   On: 11/01/2017 23:34   IMPRESSION AND PLAN:   A/P: 59F w/ known CAD/MI (s/p  CABG/stents) p/w recurrent CP of similar character x1d.  1.) CP/unstable angina: Pt p/w 1d Hx CP, similar in character to CP associated w/ prior cardiac events. VSS. Exam largely benign. (+) murmur, heart sounds otherwise normal. EKG (-) acute ischemic ST/TW changes, appears stable. Trop-I (-) x1, 2x rpt pending. Started on heparin gtt in ED. Pt's Cardiologist is Dr. Nehemiah Massed, will consult for further management recommendations. C/w ASA, Plavix, Statin, beta blocker, ARB, long-acting nitrate. O2, morphine, NTG, Tele, cardiac monitoring.  2.) Leukocytosis: WBC 15.2. (-) fever/tachycardia/tachypnea. SIRS (-). (-) urinary symptoms, (-) cough/SOB. CXR (-). Etiology unclear, may be reactive.   3.) Hyperglycemia/T2NIDDM: Glucose 175. MSSI. Hold Metformin, Glimepiride.  4.) HTN: c/w Norvasc, Ziac, Lasix, Imdur, Cozaar.  5.) HLD/CAD/MI/HFpEF: c/w ASA, Plavix, Statin, beta blocker, ARB, long-acting nitrate, Lasix.  6.) Asthma: Dulera (FS Symbicort). PRN nebs.  7.) GERD: Protonix (FS Prilosec). Pepcid (FS Zantac).  8.) FEN/GI: Cardiac diabetic diet, Protonix, Pepcid.  9.) DVT PPx: Full-dose therapeutic anticoagulation w/ heparin gtt.  10.) Code status: Full code.  11.) Disposition: Admission, pt expected to stay > 2 midnights.  -Edit: Microcytic anemia: Hgb 11.9, mild. MCV 77.3. Suspect iron deficiency anemia vs. anemia of chronic disease. Outpt iron studies, PCP f/up.   All the records are reviewed and case discussed with ED provider. Management plans discussed with the patient, family and they are in agreement.  CODE STATUS: Full code.  TOTAL TIME TAKING CARE OF THIS PATIENT: 90 minutes.    Arta Silence M.D on 11/02/2017 at 1:23 AM  Between 7am to 6pm - Pager - [phone]  After 6pm go to [email or web address] - password [email or web address] [facility]  [facility]  Office  [phone]  CC: Primary care physician; Lavera Guise, MD   Note: This dictation was prepared with  Dragon dictation along with smaller phrase technology. Any transcriptional errors that result from this process are unintentional.

## 2017-11-02 NOTE — Progress Notes (Signed)
ANTICOAGULATION CONSULT NOTE - Initial Consult  Pharmacy Consult for heparin drip Indication: chest pain/ACS  Allergies  Allergen Reactions  . Penicillin G Anaphylaxis and Other (See Comments)    Has patient had a PCN reaction causing immediate rash, facial/tongue/throat swelling, SOB or lightheadedness with hypotension: CNO:[id number]} Has patient had a PCN reaction causing severe rash involving mucus membranes or skin necrosis: no:[id number]} Has patient had a PCN reaction that required hospitalization no:[id number]} Has patient had a PCN reaction occurring within the last 10 years: no:[id number]} If all of the above answers are "NO", then may proceed with Cephalosporin use.  Marland Kitchen Penicillins     Patient Measurements: Height: _0  (170.2 cm) Weight: 257 lb 12.8 oz (116.9 kg) IBW/kg (Calculated) : 61.6 Heparin Dosing Weight: 81 kg  Vital Signs: Temp: 97.8 F (36.6 C) (05/01 0722) Temp Source: Oral (05/01 0722) BP: 136/87 (05/01 0722) Pulse Rate: 65 (05/01 0722)  Labs: Recent Labs    11/01/17 2300 11/02/17 0156 11/02/17 0529 11/02/17 1033  HGB 11.9*  --   --   --   HCT 35.7  --   --   --   PLT 145*  --   --   --   APTT  --  27  --   --   LABPROT  --  14.4  --   --   INR  --  1.13  --   --   HEPARINUNFRC  --   --   --  0.24*  CREATININE 0.73  --   --   --   TROPONINI <0.03  --  <0.03  --     Estimated Creatinine Clearance: 98.8 mL/min (by C-G formula based on SCr of 0.73 mg/dL).   Medical History: Past Medical History:  Diagnosis Date  . Asthma   . Coronary artery disease   . Diabetes mellitus without complication ([place])   . Hyperlipidemia   . Hypertension   . MI (myocardial infarction) ([place])   . Migraine headache with aura   . 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    Medications:  No anticoagulation in PTA meds  Assessment: Trop <0.03 initially  Goal of Therapy:  Heparin level 0.3-0.7 units/ml Monitor platelets by anticoagulation protocol: Yes   Plan:  4000  unit bolus and initial rate of 1000 units/hr. First heparin level 6 hours after start of infusion.  5/1_1  HL 0.24. Subtherapeutic will increase rate to heparin iv 1200 units/hr and recheck in 6 hours per protocol. Pharmacy to continue to follow.    Donna Christen Melik Blancett 11/02/2017,12:04 PM

## 2017-11-02 NOTE — Progress Notes (Signed)
MD Vianne Bulls was notified of pt having a headache and getting nausea after Morphine. MD will place orders.

## 2017-11-02 NOTE — Progress Notes (Signed)
[facility] for heparin drip Indication: chest pain/ACS  Allergies  Allergen Reactions  . Penicillin G Anaphylaxis and Other (See Comments)    Has patient had a PCN reaction causing immediate rash, facial/tongue/throat swelling, SOB or lightheadedness with hypotension: PBD:[id number]} Has patient had a PCN reaction causing severe rash involving mucus membranes or skin necrosis: no:[id number]} Has patient had a PCN reaction that required hospitalization no:[id number]} Has patient had a PCN reaction occurring within the last 10 years: no:[id number]} If all of the above answers are "NO", then may proceed with Cephalosporin use.  Marland Kitchen Penicillins     Patient Measurements: Height: '5\' 7"'  (170.2 cm) Weight: 257 lb 12.8 oz (116.9 kg) IBW/kg (Calculated) : 61.6 Heparin Dosing Weight: 81 kg  Vital Signs: BP: 131/84 (05/01 1652) Pulse Rate: 60 (05/01 1652)  Labs: Recent Labs    11/01/17 2300 11/02/17 0156 11/02/17 0529 11/02/17 1033 11/02/17 1850  HGB 11.9*  --   --   --   --   HCT 35.7  --   --   --   --   PLT 145*  --   --   --   --   APTT  --  27  --   --   --   LABPROT  --  14.4  --   --   --   INR  --  1.13  --   --   --   HEPARINUNFRC  --   --   --  0.24* 0.31  CREATININE 0.73  --   --   --   --   TROPONINI <0.03  --  <0.03  --   --     Estimated Creatinine Clearance: 98.8 mL/min (by C-G formula based on SCr of 0.73 mg/dL).   Medical History: Past Medical History:  Diagnosis Date  . Asthma   . Coronary artery disease   . Diabetes mellitus without complication ([place])   . Hyperlipidemia   . Hypertension   . MI (myocardial infarction) ([place])   . Migraine headache with aura   . 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    Medications:  No anticoagulation in PTA meds  Assessment: Trop <0.03 initially  Goal of Therapy:  Heparin level 0.3-0.7 units/ml Monitor platelets by anticoagulation protocol: Yes   Plan:  4000 unit bolus and initial rate  of 1000 units/hr. First heparin level 6 hours after start of infusion.  5/1'@1200'  HL 0.24. Subtherapeutic will increase rate to heparin iv 1200 units/hr and recheck in 6 hours per protocol.   5/1'@1850'  HL 0.31 Level is at the low end of the therapeutic range, therefore, will increase rate to 1250 units/hr and recheck in 6 hours per protocol    Felicia Woods 11/02/2017,7:32 PM

## 2017-11-02 NOTE — Plan of Care (Signed)
  Problem: Education: Goal: Knowledge of General Education information will improve Outcome: Progressing   Problem: Pain Managment: Goal: General experience of comfort will improve Outcome: Progressing   Problem: Activity: Goal: Ability to tolerate increased activity will improve Outcome: Progressing   Problem: Cardiac: Goal: Ability to achieve and maintain adequate cardiovascular perfusion will improve Outcome: Progressing

## 2017-11-03 ENCOUNTER — Inpatient Hospital Stay: Payer: Medicare Other

## 2017-11-03 DIAGNOSIS — I200 Unstable angina: Secondary | ICD-10-CM | POA: Diagnosis not present

## 2017-11-03 DIAGNOSIS — R079 Chest pain, unspecified: Secondary | ICD-10-CM | POA: Diagnosis not present

## 2017-11-03 DIAGNOSIS — I249 Acute ischemic heart disease, unspecified: Secondary | ICD-10-CM | POA: Diagnosis not present

## 2017-11-03 DIAGNOSIS — E1165 Type 2 diabetes mellitus with hyperglycemia: Secondary | ICD-10-CM | POA: Diagnosis not present

## 2017-11-03 DIAGNOSIS — R0789 Other chest pain: Secondary | ICD-10-CM | POA: Diagnosis not present

## 2017-11-03 DIAGNOSIS — D72829 Elevated white blood cell count, unspecified: Secondary | ICD-10-CM | POA: Diagnosis not present

## 2017-11-03 LAB — CBC WITH DIFFERENTIAL/PLATELET
Basophils Absolute: 0.1 10*3/uL (ref 0–0.1)
Basophils Relative: 1 %
EOS ABS: 0.1 10*3/uL (ref 0–0.7)
Eosinophils Relative: 1 %
HEMATOCRIT: 35.4 % (ref 35.0–47.0)
HEMOGLOBIN: 11.8 g/dL — AB (ref 12.0–16.0)
Lymphocytes Relative: 19 %
Lymphs Abs: 2.2 10*3/uL (ref 1.0–3.6)
MCH: 25.5 pg — ABNORMAL LOW (ref 26.0–34.0)
MCHC: 33.3 g/dL (ref 32.0–36.0)
MCV: 76.5 fL — ABNORMAL LOW (ref 80.0–100.0)
MONOS PCT: 6 %
Monocytes Absolute: 0.7 10*3/uL (ref 0.2–0.9)
NEUTROS ABS: 8.3 10*3/uL — AB (ref 1.4–6.5)
NEUTROS PCT: 73 %
Platelets: 256 10*3/uL (ref 150–440)
RBC: 4.63 MIL/uL (ref 3.80–5.20)
RDW: 17.7 % — ABNORMAL HIGH (ref 11.5–14.5)
WBC: 11.4 10*3/uL — AB (ref 3.6–11.0)

## 2017-11-03 LAB — HEPARIN LEVEL (UNFRACTIONATED): Heparin Unfractionated: 0.42 IU/mL (ref 0.30–0.70)

## 2017-11-03 LAB — NM MYOCAR MULTI W/SPECT W/WALL MOTION / EF
CSEPED: 1 min
CSEPHR: 61 %
Estimated workload: 1 METS
Exercise duration (sec): 0 s
LVDIAVOL: 63 mL (ref 46–106)
LVSYSVOL: 27 mL
MPHR: 160 {beats}/min
Peak HR: 98 {beats}/min
Rest HR: 71 {beats}/min
SDS: 0
SRS: 2
SSS: 2
TID: 1

## 2017-11-03 LAB — GLUCOSE, CAPILLARY
GLUCOSE-CAPILLARY: 108 mg/dL — AB (ref 65–99)
Glucose-Capillary: 112 mg/dL — ABNORMAL HIGH (ref 65–99)

## 2017-11-03 LAB — HIV ANTIBODY (ROUTINE TESTING W REFLEX): HIV Screen 4th Generation wRfx: NONREACTIVE

## 2017-11-03 LAB — TROPONIN I

## 2017-11-03 MED ORDER — TECHNETIUM TC 99M TETROFOSMIN IV KIT
13.1400 | PACK | Freq: Once | INTRAVENOUS | Status: AC | PRN
  Administered 2017-11-03: 13.14 via INTRAVENOUS

## 2017-11-03 MED ORDER — ISOSORBIDE MONONITRATE ER 30 MG PO TB24: 30.0000 mg | ORAL_TABLET | Freq: Every day | ORAL | 0 refills | Status: DC

## 2017-11-03 MED ORDER — TECHNETIUM TC 99M TETROFOSMIN IV KIT
30.4300 | PACK | Freq: Once | INTRAVENOUS | Status: AC | PRN
  Administered 2017-11-03: 30.43 via INTRAVENOUS

## 2017-11-03 MED ORDER — REGADENOSON 0.4 MG/5ML IV SOLN
0.4000 mg | Freq: Once | INTRAVENOUS | Status: AC
  Administered 2017-11-03: 0.4 mg via INTRAVENOUS

## 2017-11-03 NOTE — Care Management Obs Status (Signed)
[facility] NOTIFICATION   Patient Details  Name: Felicia Woods MRN: [id number] Date of Birth: 1955/10/31   Medicare Observation Status Notification Given:  Yes Patient says unable to sign using mouse.  Gave CM permission to mark the notice.   Katrina Stack, RN 11/03/2017, 2:27 PM

## 2017-11-03 NOTE — Progress Notes (Signed)
[facility] for heparin drip Indication: chest pain/ACS  Allergies  Allergen Reactions  . Penicillin G Anaphylaxis and Other (See Comments)    Has patient had a PCN reaction causing immediate rash, facial/tongue/throat swelling, SOB or lightheadedness with hypotension: VJD:[id number]} Has patient had a PCN reaction causing severe rash involving mucus membranes or skin necrosis: no:[id number]} Has patient had a PCN reaction that required hospitalization no:[id number]} Has patient had a PCN reaction occurring within the last 10 years: no:[id number]} If all of the above answers are "NO", then may proceed with Cephalosporin use.  Marland Kitchen Penicillins     Patient Measurements: Height: '5\' 7"'  (170.2 cm) Weight: 257 lb 12.8 oz (116.9 kg) IBW/kg (Calculated) : 61.6 Heparin Dosing Weight: 81 kg  Vital Signs: Temp: 98.3 F (36.8 C) (05/02 0414) Temp Source: Oral (05/02 0414) BP: 97/62 (05/02 0414) Pulse Rate: 58 (05/02 0414)  Labs: Recent Labs    11/01/17 2300 11/02/17 0156 11/02/17 0529 11/02/17 1033 11/02/17 1850 11/03/17 0208  HGB 11.9*  --   --   --   --   --   HCT 35.7  --   --   --   --   --   PLT 145*  --   --   --   --   --   APTT  --  27  --   --   --   --   LABPROT  --  14.4  --   --   --   --   INR  --  1.13  --   --   --   --   HEPARINUNFRC  --   --   --  0.24* 0.31 0.42  CREATININE 0.73  --   --   --   --   --   TROPONINI <0.03  --  <0.03  --   --   --     Estimated Creatinine Clearance: 98.8 mL/min (by C-G formula based on SCr of 0.73 mg/dL).   Medical History: Past Medical History:  Diagnosis Date  . Asthma   . Coronary artery disease   . Diabetes mellitus without complication ([place])   . Hyperlipidemia   . Hypertension   . MI (myocardial infarction) ([place])   . Migraine headache with aura   . 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    Medications:  No anticoagulation in PTA meds  Assessment: Trop <0.03 initially  Goal of Therapy:   Heparin level 0.3-0.7 units/ml Monitor platelets by anticoagulation protocol: Yes   Plan:  4000 unit bolus and initial rate of 1000 units/hr. First heparin level 6 hours after start of infusion.  5/1'@1200'  HL 0.24. Subtherapeutic will increase rate to heparin iv 1200 units/hr and recheck in 6 hours per protocol.   5/1'@1850'  HL 0.31 Level is at the low end of the therapeutic range, therefore, will increase rate to 1250 units/hr and recheck in 6 hours per protocol  05/02 0200 heparin level 0.42. Continue current regimen. Recheck heparin level and CBC with tomorrow AM labs.   Quintavia Rogstad S 11/03/2017,5:21 AM

## 2017-11-03 NOTE — Plan of Care (Signed)
Patient will have a myoview on 5/2

## 2017-11-03 NOTE — Plan of Care (Signed)
  Problem: Education: Goal: Understanding of cardiac disease, CV risk reduction, and recovery process will improve Outcome: Progressing   Problem: Activity: Goal: Ability to tolerate increased activity will improve Outcome: Progressing   Problem: Cardiac: Goal: Ability to achieve and maintain adequate cardiovascular perfusion will improve Outcome: Progressing   

## 2017-11-03 NOTE — Care Management CC44 (Signed)
Condition Code 44 Documentation Completed  Patient Details  Name: Tayelor Osborne MRN: [id number] Date of Birth: 06-09-1956   Condition Code 44 given:  Yes Patient signature on Condition Code 44 notice:  Yes Documentation of 2 MD's agreement:  Yes Code 44 added to claim:  Yes    Katrina Stack, RN 11/03/2017, 2:27 PM

## 2017-11-03 NOTE — Discharge Summary (Signed)
[facility] [place] NAME: Felicia Woods    MR#:  [id number]  DATE OF BIRTH:  1956/05/08  DATE OF ADMISSION:  11/01/2017 ADMITTING PHYSICIAN: Arta Silence, MD  DATE OF DISCHARGE: No discharge date for patient encounter.  PRIMARY CARE PHYSICIAN: Lavera Guise, MD    ADMISSION DIAGNOSIS:  Unstable angina ([place]) [I20.0]  DISCHARGE DIAGNOSIS:  Active Problems:   Chest pain   SECONDARY DIAGNOSIS:   Past Medical History:  Diagnosis Date  . Asthma   . Coronary artery disease   . Diabetes mellitus without complication ([place])   . Hyperlipidemia   . Hypertension   . MI (myocardial infarction) ([place])   . Migraine headache with aura   . 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    HOSPITAL COURSE:  *Atypical chest pain Referred to the observation unit, Lexiscan was a negative study, patient referred to primary care provider and cardiology status post discharge for reevaluation   DISCHARGE CONDITIONS:  Stable   CONSULTS OBTAINED:  Treatment Team:  Arta Silence, MD Corey Skains, MD Kayah Hecker, Avel Peace, MD  DRUG ALLERGIES:   Allergies  Allergen Reactions  . Penicillin G Anaphylaxis and Other (See Comments)    Has patient had a PCN reaction causing immediate rash, facial/tongue/throat swelling, SOB or lightheadedness with hypotension: QQV:[id number]} Has patient had a PCN reaction causing severe rash involving mucus membranes or skin necrosis: no:[id number]} Has patient had a PCN reaction that required hospitalization no:[id number]} Has patient had a PCN reaction occurring within the last 10 years: no:[id number]} If all of the above answers are "NO", then may proceed with Cephalosporin use.  Marland Kitchen Penicillins     DISCHARGE MEDICATIONS:   Allergies as of 11/03/2017      Reactions   Penicillin G Anaphylaxis, Other (See Comments)   Has patient had a PCN reaction causing immediate rash, facial/tongue/throat swelling, SOB or  lightheadedness with hypotension: EPP:[id number]} Has patient had a PCN reaction causing severe rash involving mucus membranes or skin necrosis: no:[id number]} Has patient had a PCN reaction that required hospitalization no:[id number]} Has patient had a PCN reaction occurring within the last 10 years: no:[id number]} If all of the above answers are "NO", then may proceed with Cephalosporin use.   Penicillins       Medication List    TAKE these medications   amLODipine 10 MG tablet Commonly known as:  NORVASC Take 1 tablet (10 mg total) by mouth daily.   aspirin EC 81 MG tablet Take 81 mg by mouth daily.   atorvastatin 80 MG tablet Commonly known as:  LIPITOR Take 1 tablet (80 mg total) by mouth daily at 6 PM.   bisoprolol-hydrochlorothiazide 2.5-6.25 MG tablet Commonly known as:  ZIAC Take 1 tablet by mouth daily.   clopidogrel 75 MG tablet Commonly known as:  PLAVIX Take 75 mg by mouth daily.   cyclobenzaprine 10 MG tablet Commonly known as:  FLEXERIL Take 10 mg by mouth 2 (two) times daily as needed for muscle spasms.   estradiol 0.5 MG tablet Commonly known as:  ESTRACE Take 1 tablet (0.5 mg total) by mouth daily.   furosemide 40 MG tablet Commonly known as:  LASIX Take 40 mg by mouth 2 (two) times daily.   glimepiride 1 MG tablet Commonly known as:  AMARYL Take 1 tablet (1 mg total) by mouth daily. With biggest meal   isosorbide mononitrate 30 MG 24 hr tablet Commonly known as:  IMDUR Take 1 tablet (30  mg total) by mouth daily. Start taking on:  11/04/2017 What changed:    how much to take  when to take this   losartan 50 MG tablet Commonly known as:  COZAAR Take 1 tablet (50 mg total) by mouth daily.   meclizine 25 MG tablet Commonly known as:  ANTIVERT Take 1 tablet (25 mg total) by mouth 3 (three) times daily as needed for dizziness.   montelukast 10 MG tablet Commonly known as:  SINGULAIR Take 1 tablet (10 mg total) by mouth daily.   nitroGLYCERIN 0.4  MG SL tablet Commonly known as:  NITROSTAT Place 0.4 mg under the tongue every 5 (five) minutes x 3 doses as needed for chest pain. *If no relief call MD or go to Emergency Room*   omeprazole 40 MG capsule Commonly known as:  PRILOSEC Take 1 capsule (40 mg total) by mouth daily with supper.   oxybutynin 5 MG tablet Commonly known as:  DITROPAN Take 1 tablet (5 mg total) by mouth daily after supper.   ranitidine 150 MG tablet Commonly known as:  ZANTAC Take 1 tablet (150 mg total) by mouth 2 (two) times daily.   sertraline 100 MG tablet Commonly known as:  ZOLOFT Take 100 mg by mouth daily.   simvastatin 40 MG tablet Commonly known as:  ZOCOR Take 40 mg by mouth daily.   zolpidem 10 MG tablet Commonly known as:  AMBIEN Take 10 mg by mouth at bedtime.        DISCHARGE INSTRUCTIONS:   If you experience worsening of your admission symptoms, develop shortness of breath, life threatening emergency, suicidal or homicidal thoughts you must seek medical attention immediately by calling 911 or calling your MD immediately  if symptoms less severe.  You Must read complete instructions/literature along with all the possible adverse reactions/side effects for all the Medicines you take and that have been prescribed to you. Take any new Medicines after you have completely understood and accept all the possible adverse reactions/side effects.   Please note  You were cared for by a hospitalist during your hospital stay. If you have any questions about your discharge medications or the care you received while you were in the hospital after you are discharged, you can call the unit and asked to speak with the hospitalist on call if the hospitalist that took care of you is not available. Once you are discharged, your primary care physician will handle any further medical issues. Please note that NO REFILLS for any discharge medications will be authorized once you are discharged, as it is  imperative that you return to your primary care physician (or establish a relationship with a primary care physician if you do not have one) for your aftercare needs so that they can reassess your need for medications and monitor your lab values.    Today   CHIEF COMPLAINT:   Chief Complaint  Patient presents with  . Chest Pain  . Shortness of Breath    HISTORY OF PRESENT ILLNESS:  62 y.o. female with a known history of obesity, T2NIDDM, HTN, HLD, CAD/MI (s/p CABG x4, 2010; stent 2017; stent 2018), chronic diastolic CHF/HFpEF, RBBB, asthma, GERD, migraine, DJD/OA who p/w 1d Hx CP. Pt states that she went to bed @~2130-2145 on Tuesday (11/01/2017) evening. She states she had not yet fallen asleep, and was lying in bed, when she developed sudden acute-onset CP @~2200-2215PM. She characterizes the pain as severe, sharp, R-sided, non-pleuritic, non-positional and non-reproducible, w/ associated SOB, nausea  and chills. She states the pain would radiate to her midback, between her shoulder blades, on deep inspiration. She does not know if the pain is exertional, as she stayed in bed. She denies vomiting, diaphoresis, LH/LOC. She denies radiation of pain to the arm, shoulder or jaw. She states she took NTG x2, w/ no improvement in symptoms. She became worried and asked her fiance to drive her to the hospital. She states the pain was very similar in character to the pain she had previously experienced in association w/ CAD/MI necessitating CABG/stent. Pt denies fever, night sweats, rigors, diarrhea, AP, palpitations, cough, hemoptysis, wheezing, HA, vertigo, blurred vision, urinary symptoms. She is pain-free at the time of my Hx/examination (s/p medication), and is in no acute distress. Her Cardiologist is Dr. Nehemiah Massed, whom I believe the pt last saw in 05/2017.   VITAL SIGNS:  Blood pressure 125/81, pulse 73, temperature 98.3 F (36.8 C), temperature source Oral, resp. rate 16, height '5\' 7"'  (1.702 m),  weight 116.9 kg (257 lb 12.8 oz), SpO2 97 %.  I/O:    Intake/Output Summary (Last 24 hours) at 11/03/2017 1317 Last data filed at 11/03/2017 0700 Gross per 24 hour  Intake 270.01 ml  Output 600 ml  Net -329.99 ml    PHYSICAL EXAMINATION:  GENERAL:  62 y.o.-year-old patient lying in the bed with no acute distress.  EYES: Pupils equal, round, reactive to light and accommodation. No scleral icterus. Extraocular muscles intact.  HEENT: Head atraumatic, normocephalic. Oropharynx and nasopharynx clear.  NECK:  Supple, no jugular venous distention. No thyroid enlargement, no tenderness.  LUNGS: Normal breath sounds bilaterally, no wheezing, rales,rhonchi or crepitation. No use of accessory muscles of respiration.  CARDIOVASCULAR: S1, S2 normal. No murmurs, rubs, or gallops.  ABDOMEN: Soft, non-tender, non-distended. Bowel sounds present. No organomegaly or mass.  EXTREMITIES: No pedal edema, cyanosis, or clubbing.  NEUROLOGIC: Cranial nerves II through XII are intact. Muscle strength 5/5 in all extremities. Sensation intact. Gait not checked.  PSYCHIATRIC: The patient is alert and oriented x 3.  SKIN: No obvious rash, lesion, or ulcer.   DATA REVIEW:   CBC Recent Labs  Lab 11/03/17 0838  WBC 11.4*  HGB 11.8*  HCT 35.4  PLT 256    Chemistries  Recent Labs  Lab 11/01/17 2300  NA 136  K 4.8  CL 103  CO2 25  GLUCOSE 175*  BUN 14  CREATININE 0.73  CALCIUM 9.1  AST 28  ALT 19  ALKPHOS 78  BILITOT 0.6    Cardiac Enzymes Recent Labs  Lab 11/03/17 0838  TROPONINI <0.03    Microbiology Results  Results for orders placed or performed during the hospital encounter of 10/18/15  MRSA PCR Screening     Status: None   Collection Time: 10/18/15  4:50 PM  Result Value Ref Range Status   MRSA by PCR NEGATIVE NEGATIVE Final    Comment:        The GeneXpert MRSA Assay (FDA approved for NASAL specimens only), is one component of a comprehensive MRSA colonization surveillance  program. It is not intended to diagnose MRSA infection nor to guide or monitor treatment for MRSA infections.     RADIOLOGY:  Nm Myocar Multi W/spect W/wall Motion / Ef  Result Date: 11/03/2017  This is a low risk study.  The left ventricular ejection fraction is normal (55-65%).  There was no ST segment deviation noted during stress.  NOrmal lv function Senstivity decreased by gi uptake. No evidence of ischemia  Low risk study.   Dg Chest Port 1 View  Result Date: 11/01/2017 CLINICAL DATA:  Chest pain, shortness of breath EXAM: PORTABLE CHEST 1 VIEW COMPARISON:  10/18/2015 FINDINGS: Lungs are clear.  No pleural effusion or pneumothorax. The heart is normal in size. Postsurgical changes related to prior CABG. Median sternotomy. IMPRESSION: No evidence of acute cardiopulmonary disease. Electronically Signed   By: Julian Hy M.D.   On: 11/01/2017 23:34    EKG:   Orders placed or performed during the hospital encounter of 11/01/17  . EKG 12-Lead  . EKG 12-Lead  . ED EKG within 10 minutes  . ED EKG within 10 minutes  . Repeat EKG  . Repeat EKG  . EKG 12-Lead  . EKG 12-Lead  . EKG 12-Lead  . EKG 12-Lead  . EKG 12-Lead      Management plans discussed with the patient, family and they are in agreement.  CODE STATUS:     Code Status Orders  (From admission, onward)        Start     Ordered   11/02/17 0217  Full code  Continuous     11/02/17 0216    Code Status History    Date Active Date Inactive Code Status Order ID Comments User Context   02/16/2017 1024 02/17/2017 1245 Full Code [id number]  Yolonda Kida, MD Inpatient    Advance Directive Documentation     Most Recent Value  Type of Advance Directive  Healthcare Power of [place], Living will  Pre-existing out of facility DNR order (yellow form or pink MOST form)  -  "MOST" Form in Place?  -      TOTAL TIME TAKING CARE OF THIS PATIENT: 45 minutes.    Avel Peace Sarabi Sockwell M.D on 11/03/2017 at 1:17  PM  Between 7am to 6pm - Pager - [phone]  After 6pm go to [email or web address] - password [email or web address] [facility]  Office  [phone]  CC: Primary care physician; Lavera Guise, MD   Note: This dictation was prepared with Dragon dictation along with smaller phrase technology. Any transcriptional errors that result from this process are unintentional.

## 2017-11-16 DIAGNOSIS — I25708 Atherosclerosis of coronary artery bypass graft(s), unspecified, with other forms of angina pectoris: Secondary | ICD-10-CM | POA: Diagnosis not present

## 2017-11-23 ENCOUNTER — Encounter
Admission: RE | Disposition: A | Discharge status: Home / Self Care | Payer: Self-pay | Source: Ambulatory Visit | Attending: Internal Medicine

## 2017-11-23 ENCOUNTER — Encounter: Payer: Self-pay | Admitting: Anesthesiology

## 2017-11-23 ENCOUNTER — Ambulatory Visit: Payer: Self-pay | Admitting: Internal Medicine

## 2017-11-23 ENCOUNTER — Ambulatory Visit
Admission: RE | Admit: 2017-11-23 | Discharge: 2017-11-23 | Disposition: A | Discharge status: Home / Self Care | Payer: Medicare Other | Source: Ambulatory Visit | Attending: Internal Medicine | Admitting: Internal Medicine

## 2017-11-23 DIAGNOSIS — I25718 Atherosclerosis of autologous vein coronary artery bypass graft(s) with other forms of angina pectoris: Secondary | ICD-10-CM | POA: Insufficient documentation

## 2017-11-23 DIAGNOSIS — J45909 Unspecified asthma, uncomplicated: Secondary | ICD-10-CM | POA: Insufficient documentation

## 2017-11-23 DIAGNOSIS — Z7902 Long term (current) use of antithrombotics/antiplatelets: Secondary | ICD-10-CM | POA: Insufficient documentation

## 2017-11-23 DIAGNOSIS — Z7984 Long term (current) use of oral hypoglycemic drugs: Secondary | ICD-10-CM | POA: Diagnosis not present

## 2017-11-23 DIAGNOSIS — E119 Type 2 diabetes mellitus without complications: Secondary | ICD-10-CM | POA: Diagnosis not present

## 2017-11-23 DIAGNOSIS — R079 Chest pain, unspecified: Secondary | ICD-10-CM | POA: Diagnosis present

## 2017-11-23 DIAGNOSIS — Z7982 Long term (current) use of aspirin: Secondary | ICD-10-CM | POA: Insufficient documentation

## 2017-11-23 DIAGNOSIS — I10 Essential (primary) hypertension: Secondary | ICD-10-CM | POA: Insufficient documentation

## 2017-11-23 DIAGNOSIS — G43909 Migraine, unspecified, not intractable, without status migrainosus: Secondary | ICD-10-CM | POA: Insufficient documentation

## 2017-11-23 DIAGNOSIS — I25790 Atherosclerosis of other coronary artery bypass graft(s) with unstable angina pectoris: Secondary | ICD-10-CM | POA: Diagnosis not present

## 2017-11-23 DIAGNOSIS — E785 Hyperlipidemia, unspecified: Secondary | ICD-10-CM | POA: Diagnosis not present

## 2017-11-23 DIAGNOSIS — I2510 Atherosclerotic heart disease of native coronary artery without angina pectoris: Secondary | ICD-10-CM | POA: Diagnosis present

## 2017-11-23 DIAGNOSIS — Z7951 Long term (current) use of inhaled steroids: Secondary | ICD-10-CM | POA: Diagnosis not present

## 2017-11-23 DIAGNOSIS — I2582 Chronic total occlusion of coronary artery: Secondary | ICD-10-CM | POA: Diagnosis not present

## 2017-11-23 HISTORY — PX: LEFT HEART CATH AND CORS/GRAFTS ANGIOGRAPHY: CATH118250

## 2017-11-23 LAB — GLUCOSE, CAPILLARY: GLUCOSE-CAPILLARY: 101 mg/dL — AB (ref 65–99)

## 2017-11-23 SURGERY — LEFT HEART CATH AND CORS/GRAFTS ANGIOGRAPHY
Anesthesia: Moderate Sedation | Laterality: Left

## 2017-11-23 MED ORDER — MIDAZOLAM HCL 2 MG/2ML IJ SOLN
INTRAMUSCULAR | Status: AC
  Filled 2017-11-23: qty 2

## 2017-11-23 MED ORDER — FENTANYL CITRATE (PF) 100 MCG/2ML IJ SOLN
INTRAMUSCULAR | Status: DC | PRN
  Administered 2017-11-23 (×2): 25 ug via INTRAVENOUS

## 2017-11-23 MED ORDER — FENTANYL CITRATE (PF) 100 MCG/2ML IJ SOLN
INTRAMUSCULAR | Status: AC
  Filled 2017-11-23: qty 2

## 2017-11-23 MED ORDER — ACETAMINOPHEN 325 MG PO TABS: 650.0000 mg | ORAL_TABLET | ORAL | Status: DC | PRN

## 2017-11-23 MED ORDER — SODIUM CHLORIDE 0.9 % WEIGHT BASED INFUSION
350.0000 mL/h | INTRAVENOUS | Status: AC
  Administered 2017-11-23: 3 mL/kg/h via INTRAVENOUS

## 2017-11-23 MED ORDER — MORPHINE SULFATE (PF) 2 MG/ML IV SOLN
2.0000 mg | Freq: Once | INTRAVENOUS | Status: AC
  Administered 2017-11-23: 2 mg via INTRAVENOUS

## 2017-11-23 MED ORDER — SODIUM CHLORIDE 0.9 % IV SOLN: 250.0000 mL | INTRAVENOUS | Status: DC | PRN

## 2017-11-23 MED ORDER — ASPIRIN 81 MG PO CHEW
81.0000 mg | CHEWABLE_TABLET | ORAL | Status: AC
  Administered 2017-11-23: 81 mg via ORAL

## 2017-11-23 MED ORDER — LIDOCAINE HCL (PF) 1 % IJ SOLN
INTRAMUSCULAR | Status: AC
  Filled 2017-11-23: qty 30

## 2017-11-23 MED ORDER — HEPARIN (PORCINE) IN NACL 1000-0.9 UT/500ML-% IV SOLN
INTRAVENOUS | Status: AC
  Filled 2017-11-23: qty 500

## 2017-11-23 MED ORDER — SODIUM CHLORIDE 0.9% FLUSH: 3.0000 mL | INTRAVENOUS | Status: DC | PRN

## 2017-11-23 MED ORDER — ASPIRIN 81 MG PO CHEW
CHEWABLE_TABLET | ORAL | Status: AC
  Filled 2017-11-23: qty 1

## 2017-11-23 MED ORDER — ONDANSETRON HCL 4 MG/2ML IJ SOLN: 4.0000 mg | Freq: Four times a day (QID) | INTRAMUSCULAR | Status: DC | PRN

## 2017-11-23 MED ORDER — MIDAZOLAM HCL 2 MG/2ML IJ SOLN
INTRAMUSCULAR | Status: DC | PRN
  Administered 2017-11-23 (×2): 1 mg via INTRAVENOUS

## 2017-11-23 MED ORDER — IOPAMIDOL (ISOVUE-300) INJECTION 61%
INTRAVENOUS | Status: DC | PRN
  Administered 2017-11-23: 210 mL via INTRA_ARTERIAL

## 2017-11-23 MED ORDER — SODIUM CHLORIDE 0.9% FLUSH: 3.0000 mL | Freq: Two times a day (BID) | INTRAVENOUS | Status: DC

## 2017-11-23 MED ORDER — SODIUM CHLORIDE 0.9 % WEIGHT BASED INFUSION: 1.0000 mL/kg/h | INTRAVENOUS | Status: DC

## 2017-11-23 MED ORDER — MORPHINE SULFATE (PF) 2 MG/ML IV SOLN
INTRAVENOUS | Status: AC
  Filled 2017-11-23: qty 1

## 2017-11-23 SURGICAL SUPPLY — 11 items
CATH INFINITI 5 FR LCB (CATHETERS) ×2 IMPLANT
CATH INFINITI 5 FR RCB (CATHETERS) ×2 IMPLANT
CATH INFINITI 5FR ANG PIGTAIL (CATHETERS) ×2 IMPLANT
CATH INFINITI 5FR JL4 (CATHETERS) ×2 IMPLANT
CATH INFINITI JR4 5F (CATHETERS) ×2 IMPLANT
DEVICE CLOSURE MYNXGRIP 5F (Vascular Products) ×2 IMPLANT
KIT MANI 3VAL PERCEP (MISCELLANEOUS) ×2 IMPLANT
NEEDLE PERC 18GX7CM (NEEDLE) ×2 IMPLANT
PACK CARDIAC CATH (CUSTOM PROCEDURE TRAY) ×2 IMPLANT
SHEATH AVANTI 5FR X 11CM (SHEATH) ×2 IMPLANT
WIRE GUIDERIGHT .035X150 (WIRE) ×2 IMPLANT

## 2017-11-23 NOTE — Progress Notes (Signed)
Dr. Nehemiah Massed paged to review d/c meds on AVS. Pt confirms she takes Simvastatin 40 mg daily and does not take Atorvastatin. Per MD Pt to continue meds she was taking prior to today's visit including Simvastatin. MD to make med changes at next scheduled appointment in June.

## 2017-12-08 DIAGNOSIS — M1711 Unilateral primary osteoarthritis, right knee: Secondary | ICD-10-CM | POA: Diagnosis not present

## 2017-12-08 DIAGNOSIS — M1712 Unilateral primary osteoarthritis, left knee: Secondary | ICD-10-CM | POA: Diagnosis not present

## 2017-12-08 DIAGNOSIS — M170 Bilateral primary osteoarthritis of knee: Secondary | ICD-10-CM | POA: Diagnosis not present

## 2017-12-13 ENCOUNTER — Emergency Department: Payer: Medicare Other

## 2017-12-13 ENCOUNTER — Other Ambulatory Visit: Payer: Self-pay

## 2017-12-13 ENCOUNTER — Observation Stay
Admission: EM | Admit: 2017-12-13 | Discharge: 2017-12-15 | Disposition: A | Discharge status: Home / Self Care | Payer: Medicare Other | Attending: Internal Medicine | Admitting: Internal Medicine

## 2017-12-13 DIAGNOSIS — Z79899 Other long term (current) drug therapy: Secondary | ICD-10-CM | POA: Insufficient documentation

## 2017-12-13 DIAGNOSIS — Z955 Presence of coronary angioplasty implant and graft: Secondary | ICD-10-CM | POA: Diagnosis not present

## 2017-12-13 DIAGNOSIS — Z7951 Long term (current) use of inhaled steroids: Secondary | ICD-10-CM | POA: Insufficient documentation

## 2017-12-13 DIAGNOSIS — R079 Chest pain, unspecified: Secondary | ICD-10-CM | POA: Diagnosis not present

## 2017-12-13 DIAGNOSIS — Z90710 Acquired absence of both cervix and uterus: Secondary | ICD-10-CM | POA: Diagnosis not present

## 2017-12-13 DIAGNOSIS — J90 Pleural effusion, not elsewhere classified: Secondary | ICD-10-CM | POA: Diagnosis not present

## 2017-12-13 DIAGNOSIS — J45909 Unspecified asthma, uncomplicated: Secondary | ICD-10-CM | POA: Insufficient documentation

## 2017-12-13 DIAGNOSIS — I2510 Atherosclerotic heart disease of native coronary artery without angina pectoris: Secondary | ICD-10-CM | POA: Insufficient documentation

## 2017-12-13 DIAGNOSIS — Z880 Allergy status to penicillin: Secondary | ICD-10-CM | POA: Insufficient documentation

## 2017-12-13 DIAGNOSIS — Z7982 Long term (current) use of aspirin: Secondary | ICD-10-CM | POA: Insufficient documentation

## 2017-12-13 DIAGNOSIS — Z7902 Long term (current) use of antithrombotics/antiplatelets: Secondary | ICD-10-CM | POA: Insufficient documentation

## 2017-12-13 DIAGNOSIS — G43909 Migraine, unspecified, not intractable, without status migrainosus: Secondary | ICD-10-CM | POA: Diagnosis not present

## 2017-12-13 DIAGNOSIS — M549 Dorsalgia, unspecified: Secondary | ICD-10-CM | POA: Insufficient documentation

## 2017-12-13 DIAGNOSIS — K219 Gastro-esophageal reflux disease without esophagitis: Secondary | ICD-10-CM | POA: Diagnosis not present

## 2017-12-13 DIAGNOSIS — I252 Old myocardial infarction: Secondary | ICD-10-CM | POA: Diagnosis not present

## 2017-12-13 DIAGNOSIS — F419 Anxiety disorder, unspecified: Secondary | ICD-10-CM | POA: Diagnosis not present

## 2017-12-13 DIAGNOSIS — I4510 Unspecified right bundle-branch block: Secondary | ICD-10-CM | POA: Insufficient documentation

## 2017-12-13 DIAGNOSIS — Z794 Long term (current) use of insulin: Secondary | ICD-10-CM | POA: Insufficient documentation

## 2017-12-13 DIAGNOSIS — E785 Hyperlipidemia, unspecified: Secondary | ICD-10-CM | POA: Insufficient documentation

## 2017-12-13 DIAGNOSIS — E119 Type 2 diabetes mellitus without complications: Secondary | ICD-10-CM | POA: Insufficient documentation

## 2017-12-13 DIAGNOSIS — I10 Essential (primary) hypertension: Secondary | ICD-10-CM | POA: Diagnosis not present

## 2017-12-13 DIAGNOSIS — Z9049 Acquired absence of other specified parts of digestive tract: Secondary | ICD-10-CM | POA: Diagnosis not present

## 2017-12-13 DIAGNOSIS — R0602 Shortness of breath: Secondary | ICD-10-CM | POA: Diagnosis not present

## 2017-12-13 DIAGNOSIS — Z951 Presence of aortocoronary bypass graft: Secondary | ICD-10-CM | POA: Insufficient documentation

## 2017-12-13 DIAGNOSIS — I700 Atherosclerosis of aorta: Secondary | ICD-10-CM | POA: Insufficient documentation

## 2017-12-13 LAB — BASIC METABOLIC PANEL
ANION GAP: 9 (ref 5–15)
BUN: 15 mg/dL (ref 6–20)
CHLORIDE: 105 mmol/L (ref 101–111)
CO2: 25 mmol/L (ref 22–32)
Calcium: 8.8 mg/dL — ABNORMAL LOW (ref 8.9–10.3)
Creatinine, Ser: 0.77 mg/dL (ref 0.44–1.00)
GFR calc Af Amer: >60 mL/min (ref >60)
Glucose, Bld: 138 mg/dL — ABNORMAL HIGH (ref 65–99)
POTASSIUM: 3.2 mmol/L — AB (ref 3.5–5.1)
SODIUM: 139 mmol/L (ref 135–145)

## 2017-12-13 LAB — CBC WITH DIFFERENTIAL/PLATELET
Basophils Absolute: 0.1 10*3/uL (ref 0–0.1)
Basophils Relative: 1 %
EOS ABS: 0.1 10*3/uL (ref 0–0.7)
EOS PCT: 1 %
HCT: 36 % (ref 35.0–47.0)
HEMOGLOBIN: 11.8 g/dL — AB (ref 12.0–16.0)
Lymphocytes Relative: 27 %
Lymphs Abs: 2.6 10*3/uL (ref 1.0–3.6)
MCH: 25.7 pg — ABNORMAL LOW (ref 26.0–34.0)
MCHC: 32.9 g/dL (ref 32.0–36.0)
MCV: 77.9 fL — ABNORMAL LOW (ref 80.0–100.0)
Monocytes Absolute: 0.7 10*3/uL (ref 0.2–0.9)
Monocytes Relative: 7 %
NEUTROS PCT: 64 %
Neutro Abs: 6.3 10*3/uL (ref 1.4–6.5)
PLATELETS: 200 10*3/uL (ref 150–440)
RBC: 4.62 MIL/uL (ref 3.80–5.20)
RDW: 17.3 % — ABNORMAL HIGH (ref 11.5–14.5)
WBC: 9.7 10*3/uL (ref 3.6–11.0)

## 2017-12-13 LAB — TROPONIN I

## 2017-12-13 MED ORDER — MORPHINE SULFATE (PF) 4 MG/ML IV SOLN
4.0000 mg | Freq: Once | INTRAVENOUS | Status: AC
  Administered 2017-12-13: 4 mg via INTRAVENOUS
  Filled 2017-12-13: qty 1

## 2017-12-13 NOTE — ED Notes (Signed)
Pt reports having severe chest pains that started on tonight. Pt alert and oriented x 4. Family at bedside.

## 2017-12-13 NOTE — ED Triage Notes (Addendum)
Pt in with co cp that started 30 PTA with shob. Pt has hx of MI and stent placements with last one placed 11/23/17. Pt took 2 ntg at home without relief

## 2017-12-13 NOTE — ED Provider Notes (Signed)
[facility] Emergency Department Provider Note   ____________________________________________   I have reviewed the triage vital signs and the nursing notes.   HISTORY  Chief Complaint Chest Pain   History limited by: Not Limited   HPI Felicia Woods is a 62 y.o. female who presents to the emergency department today because of concerns for chest pain.  It stated this evening.  The patient was sitting down when it started.  She describes obese located in her center chest.  She describes it as sharp.  Will occasionally radiate to her back.  It is worse with certain movements.  She feels short of breath with this.  She states it reminds her of the pain she had prior to her previous stent placements.  Patient states that earlier in the day she had been in her normal state of health.  She did undergo catheterization with stent placement last month.  She denies any fevers.    Per medical record review patient has a history of recent catheterization and stent placement.   Past Medical History:  Diagnosis Date  . Asthma   . Coronary artery disease   . Diabetes mellitus without complication ([place])   . Hyperlipidemia   . Hypertension   . MI (myocardial infarction) ([place])   . Migraine headache with aura   . 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    Patient Active Problem List   Diagnosis Date Noted  . Chest pain 11/02/2017  . S/P drug eluting coronary stent placement 02/16/2017  . Acute on chronic diastolic CHF (congestive heart failure), NYHA class 3 ([place]) 12/07/2016  . Numbness and tingling 12/01/2016  . Chronic tension-type headache, intractable 12/01/2016  . Osteoarthritis of knee 11/12/2016  . Obesity, Class II, BMI 35-39.9 05/13/2016  . Hx of CABG 2010 11/12/2015  . Type 2 diabetes mellitus with circulatory disorder, without long-term current use of insulin ([place]) 11/12/2015  . Morbid obesity ([place]) 11/12/2015  . SOBOE (shortness of breath on exertion)  11/12/2015  . RBBB 11/12/2015  . ST elevation myocardial infarction (STEMI) of inferior wall ([place]) 11/03/2015  . SVG-RCA throbectomy and DES 10/18/15   . Hypertensive heart disease without heart failure   . Vertigo   . Hyperlipidemia   . STEMI 10/18/15 10/18/2015  . Intractable chronic migraine without aura and without status migrainosus 05/08/2015  . Benign essential HTN 03/17/2015  . CAD (coronary artery disease) 08/26/2014  . Headache 08/06/2014    Past Surgical History:  Procedure Laterality Date  . ABDOMINAL HYSTERECTOMY    . CARDIAC CATHETERIZATION    . CARDIAC CATHETERIZATION N/A 10/18/2015   Procedure: Left Heart Cath and Cors/Grafts Angiography;  Surgeon: Lorretta Harp, MD;  Location: [place] [facility];  Service: Cardiovascular;  Laterality: N/A;  . CARDIAC CATHETERIZATION N/A 10/18/2015   Procedure: Coronary Stent Intervention;  Surgeon: Lorretta Harp, MD;  Location: [place] [facility];  Service: Cardiovascular;  Laterality: N/A;  . CARDIAC SURGERY    . CHOLECYSTECTOMY    . CORONARY ANGIOPLASTY    . CORONARY ARTERY BYPASS GRAFT     4 vessels - 2010  . CORONARY STENT INTERVENTION N/A 02/16/2017   Procedure: CORONARY STENT INTERVENTION;  Surgeon: Yolonda Kida, MD;  Location: [place] [facility];  Service: Cardiovascular;  Laterality: N/A;  . LEFT HEART CATH AND CORONARY ANGIOGRAPHY Left 02/16/2017   Procedure: LEFT HEART CATH AND CORONARY ANGIOGRAPHY;  Surgeon: Corey Skains, MD;  Location: [place] [facility];  Service: Cardiovascular;  Laterality: Left;  . LEFT HEART CATH AND CORS/GRAFTS ANGIOGRAPHY Left 11/23/2017   Procedure: LEFT HEART CATH AND CORS/GRAFTS ANGIOGRAPHY;  Surgeon: Corey Skains, MD;  Location: [place] [facility];  Service: Cardiovascular;  Laterality: Left;    Prior to Admission medications   Medication Sig Start Date End Date Taking? Authorizing Provider  albuterol (PROVENTIL HFA;VENTOLIN HFA) 108 (90 Base) MCG/ACT inhaler Inhale 2  puffs into the lungs every 6 (six) hours as needed for wheezing or shortness of breath.    [provider]  amLODipine (NORVASC) 10 MG tablet Take 1 tablet (10 mg total) by mouth daily. Patient not taking: Reported on 11/23/2017 07/18/17   Ronnell Freshwater, NP  aspirin EC 81 MG tablet Take 81 mg by mouth daily.    [provider]  atorvastatin (LIPITOR) 80 MG tablet Take 1 tablet (80 mg total) by mouth daily at 6 PM. Patient not taking: Reported on 03/10/2017 10/30/15   Lorretta Harp, MD  bisoprolol-hydrochlorothiazide [facility]) 2.5-6.25 MG tablet Take 1 tablet by mouth daily. 07/18/17   Ronnell Freshwater, NP  budesonide-formoterol (SYMBICORT) 160-4.5 MCG/ACT inhaler Inhale 2 puffs into the lungs 2 (two) times daily.    [provider]  clopidogrel (PLAVIX) 75 MG tablet Take 75 mg by mouth daily.    [provider]  cyclobenzaprine (FLEXERIL) 10 MG tablet Take 10 mg by mouth 2 (two) times daily as needed for muscle spasms.     [provider]  estradiol (ESTRACE) 0.5 MG tablet Take 1 tablet (0.5 mg total) by mouth daily. 07/18/17   Ronnell Freshwater, NP  furosemide (LASIX) 40 MG tablet Take 40 mg by mouth 2 (two) times daily.     [provider]  glimepiride (AMARYL) 1 MG tablet Take 1 tablet (1 mg total) by mouth daily. With biggest meal 09/09/17   Ronnell Freshwater, NP  isosorbide mononitrate (IMDUR) 30 MG 24 hr tablet Take 1 tablet (30 mg total) by mouth daily. Patient not taking: Reported on 11/18/2017 11/03/17 11/03/18  Salary, Avel Peace, MD  losartan (COZAAR) 50 MG tablet Take 1 tablet (50 mg total) by mouth daily. 09/09/17   Ronnell Freshwater, NP  meclizine (ANTIVERT) 25 MG tablet Take 1 tablet (25 mg total) by mouth 3 (three) times daily as needed for dizziness. Patient not taking: Reported on 11/18/2015 10/22/15   Leanor Kail, PA  metFORMIN (GLUCOPHAGE) 500 MG tablet Take by mouth 2 (two) times daily with a meal.    [provider]   montelukast (SINGULAIR) 10 MG tablet Take 1 tablet (10 mg total) by mouth daily. Patient not taking: Reported on 11/23/2017 07/18/17   Ronnell Freshwater, NP  nitroGLYCERIN (NITROSTAT) 0.4 MG SL tablet Place 0.4 mg under the tongue every 5 (five) minutes x 3 doses as needed for chest pain. *If no relief call MD or go to Emergency Room*    [provider]  omeprazole (PRILOSEC) 40 MG capsule Take 1 capsule (40 mg total) by mouth daily with supper. Patient not taking: Reported on 11/02/2017 07/29/17   Ronnell Freshwater, NP  oxybutynin (DITROPAN) 5 MG tablet Take 1 tablet (5 mg total) by mouth daily after supper. Patient not taking: Reported on 11/23/2017 09/09/17   Ronnell Freshwater, NP  ranitidine (ZANTAC) 150 MG tablet Take 1 tablet (150 mg total) by mouth 2 (two) times daily. Patient not taking: Reported on 11/23/2017 07/18/17   Ronnell Freshwater, NP  sertraline (ZOLOFT) 100 MG tablet  Take 100 mg by mouth daily.  12/27/14   [provider]  traMADol (ULTRAM) 50 MG tablet Take 50 mg by mouth every 6 (six) hours as needed for moderate pain.    [provider]  zolpidem (AMBIEN) 10 MG tablet Take 10 mg by mouth at bedtime. 10/08/15   [provider]    Allergies Penicillin g  No family history on file.  Social History Social History   Tobacco Use  . Smoking status: Never Smoker  . Smokeless tobacco: Never Used  Substance Use Topics  . Alcohol use: No    Alcohol/week: 0.0 oz  . Drug use: No    Review of Systems Constitutional: No fever/chills Eyes: No visual changes. ENT: No sore throat. Cardiovascular: Denies chest pain. Respiratory: Denies shortness of breath. Gastrointestinal: No abdominal pain.  No nausea, no vomiting.  No diarrhea.   Genitourinary: Negative for dysuria. Musculoskeletal: Negative for back pain. Skin: Negative for rash. Neurological: Negative for headaches, focal weakness or  numbness.  ____________________________________________   PHYSICAL EXAM:  VITAL SIGNS: ED Triage Vitals  Enc Vitals Group     BP 12/13/17 2144 (!) 158/83     Pulse Rate 12/13/17 2144 70     Resp 12/13/17 2144 18     Temp 12/13/17 2144 98.3 F (36.8 C)     Temp Source 12/13/17 2144 Oral     SpO2 12/13/17 2144 100 %     Weight 12/13/17 2145 225 lb (102.1 kg)     Height 12/13/17 2145 5' 7" (1.702 m)     Head Circumference --      Peak Flow --      Pain Score 12/13/17 2145 10    Constitutional: Alert and oriented.  Eyes: Conjunctivae are normal.  ENT      Head: Normocephalic and atraumatic.      Nose: No congestion/rhinnorhea.      Mouth/Throat: Mucous membranes are moist.      Neck: No stridor. Hematological/Lymphatic/Immunilogical: No cervical lymphadenopathy. Cardiovascular: Normal rate, regular rhythm.  No murmurs, rubs, or gallops. Respiratory: Normal respiratory effort without tachypnea nor retractions. Breath sounds are clear and equal bilaterally. No wheezes/rales/rhonchi. Gastrointestinal: Soft and non tender. No rebound. No guarding.  Genitourinary: Deferred Musculoskeletal: Normal range of motion in all extremities. No lower extremity edema. Neurologic:  Normal speech and language. No gross focal neurologic deficits are appreciated.  Skin:  Skin is warm, dry and intact. No rash noted. Psychiatric: Mood and affect are normal. Speech and behavior are normal. Patient exhibits appropriate insight and judgment.  ____________________________________________    LABS (pertinent positives/negatives)  CBC wbc 9.7, hgb 11.8, plt 200 BMP na 139, k 3.2, glu 139, cr 0.77 Trop <0.03  ____________________________________________   EKG  I, Nance Pear, attending physician, personally viewed and interpreted this EKG  EKG Time: 2143 Rate: 73 Rhythm: normal sinus rhythm Axis: normal Intervals: qtc 491 QRS: RBBB ST changes: no st elevation Impression: abnormal  ekg  ____________________________________________    RADIOLOGY  CXR Concern for retrocardiac infiltrate  ____________________________________________   PROCEDURES  Procedures  ____________________________________________   INITIAL IMPRESSION / ASSESSMENT AND PLAN / ED COURSE  Pertinent labs & imaging results that were available during my care of the patient were reviewed by me and considered in my medical decision making (see chart for details).   Patient presented to the emergency department today because of concerns for chest pain.  Differential be broad including pneumothorax PE pneumonia esophagitis gastritis ruptured esophagus pericarditis ACS amongst  other etiologies.  Patient has a significant ACS history and had a catheterization on the 22nd which showed significant stenosis.  There is a note on that report about possible PCI if symptoms persist.  This point I think ACS most likely.  Patient did feel better after pain medication.  Chest x-ray shows possible infiltrate.  At this point given lack of fever or leukocytosis I doubt pneumonia.  Will add on pro-calcitonin.  Patient will be admitted to the hospital service.  Discussed plan with patient.  ____________________________________________   FINAL CLINICAL IMPRESSION(S) / ED DIAGNOSES  Final diagnoses:  Chest pain, unspecified type     Note: This dictation was prepared with Dragon dictation. Any transcriptional errors that result from this process are unintentional     Nance Pear, MD 12/13/17 2346

## 2017-12-14 ENCOUNTER — Observation Stay: Payer: Medicare Other

## 2017-12-14 DIAGNOSIS — I10 Essential (primary) hypertension: Secondary | ICD-10-CM | POA: Diagnosis not present

## 2017-12-14 DIAGNOSIS — E119 Type 2 diabetes mellitus without complications: Secondary | ICD-10-CM | POA: Diagnosis not present

## 2017-12-14 DIAGNOSIS — R079 Chest pain, unspecified: Secondary | ICD-10-CM | POA: Diagnosis not present

## 2017-12-14 DIAGNOSIS — R0789 Other chest pain: Secondary | ICD-10-CM | POA: Diagnosis not present

## 2017-12-14 DIAGNOSIS — I2510 Atherosclerotic heart disease of native coronary artery without angina pectoris: Secondary | ICD-10-CM | POA: Diagnosis not present

## 2017-12-14 LAB — GLUCOSE, CAPILLARY
GLUCOSE-CAPILLARY: 75 mg/dL (ref 65–99)
GLUCOSE-CAPILLARY: 152 mg/dL — AB (ref 65–99)
GLUCOSE-CAPILLARY: 168 mg/dL — AB (ref 65–99)
Glucose-Capillary: 118 mg/dL — ABNORMAL HIGH (ref 65–99)
Glucose-Capillary: 152 mg/dL — ABNORMAL HIGH (ref 65–99)

## 2017-12-14 LAB — BASIC METABOLIC PANEL
Anion gap: 7 (ref 5–15)
BUN: 16 mg/dL (ref 6–20)
CHLORIDE: 108 mmol/L (ref 101–111)
CO2: 24 mmol/L (ref 22–32)
Calcium: 8.6 mg/dL — ABNORMAL LOW (ref 8.9–10.3)
Creatinine, Ser: 0.57 mg/dL (ref 0.44–1.00)
GFR calc Af Amer: >60 mL/min (ref >60)
GFR calc non Af Amer: >60 mL/min (ref >60)
Glucose, Bld: 144 mg/dL — ABNORMAL HIGH (ref 65–99)
POTASSIUM: 3.5 mmol/L (ref 3.5–5.1)
Sodium: 139 mmol/L (ref 135–145)

## 2017-12-14 LAB — CBC
HEMATOCRIT: 33.9 % — AB (ref 35.0–47.0)
HEMOGLOBIN: 11.3 g/dL — AB (ref 12.0–16.0)
MCH: 25.8 pg — ABNORMAL LOW (ref 26.0–34.0)
MCHC: 33.4 g/dL (ref 32.0–36.0)
MCV: 77.3 fL — AB (ref 80.0–100.0)
Platelets: 194 10*3/uL (ref 150–440)
RBC: 4.38 MIL/uL (ref 3.80–5.20)
RDW: 17.3 % — ABNORMAL HIGH (ref 11.5–14.5)
WBC: 7.5 10*3/uL (ref 3.6–11.0)

## 2017-12-14 LAB — PROCALCITONIN

## 2017-12-14 MED ORDER — SERTRALINE HCL 50 MG PO TABS
100.0000 mg | ORAL_TABLET | Freq: Every day | ORAL | Status: DC
  Administered 2017-12-14 – 2017-12-15 (×2): 100 mg via ORAL
  Filled 2017-12-14 (×2): qty 2

## 2017-12-14 MED ORDER — DOCUSATE SODIUM 100 MG PO CAPS
100.0000 mg | ORAL_CAPSULE | Freq: Two times a day (BID) | ORAL | Status: DC
  Administered 2017-12-14 – 2017-12-15 (×3): 100 mg via ORAL
  Filled 2017-12-14 (×4): qty 1

## 2017-12-14 MED ORDER — NITROGLYCERIN 0.4 MG SL SUBL: 0.4000 mg | SUBLINGUAL_TABLET | SUBLINGUAL | Status: DC | PRN

## 2017-12-14 MED ORDER — ESTRADIOL 1 MG PO TABS
0.5000 mg | ORAL_TABLET | Freq: Every day | ORAL | Status: DC
  Administered 2017-12-14 – 2017-12-15 (×2): 0.5 mg via ORAL
  Filled 2017-12-14 (×2): qty 0.5

## 2017-12-14 MED ORDER — CLOPIDOGREL BISULFATE 75 MG PO TABS
75.0000 mg | ORAL_TABLET | Freq: Every day | ORAL | Status: DC
  Administered 2017-12-14 – 2017-12-15 (×2): 75 mg via ORAL
  Filled 2017-12-14 (×2): qty 1

## 2017-12-14 MED ORDER — HEPARIN SODIUM (PORCINE) 5000 UNIT/ML IJ SOLN
5000.0000 [IU] | Freq: Three times a day (TID) | INTRAMUSCULAR | Status: DC
  Administered 2017-12-14: 5000 [IU] via SUBCUTANEOUS
  Filled 2017-12-14 (×2): qty 1

## 2017-12-14 MED ORDER — FLUTICASONE FUROATE-VILANTEROL 200-25 MCG/INH IN AEPB
1.0000 | INHALATION_SPRAY | Freq: Every day | RESPIRATORY_TRACT | Status: DC
  Administered 2017-12-14 – 2017-12-15 (×2): 1 via RESPIRATORY_TRACT
  Filled 2017-12-14: qty 28

## 2017-12-14 MED ORDER — SIMVASTATIN 10 MG PO TABS
10.0000 mg | ORAL_TABLET | Freq: Every day | ORAL | Status: DC
  Administered 2017-12-14 – 2017-12-15 (×2): 10 mg via ORAL
  Filled 2017-12-14 (×2): qty 1

## 2017-12-14 MED ORDER — BISOPROLOL-HYDROCHLOROTHIAZIDE 2.5-6.25 MG PO TABS
1.0000 | ORAL_TABLET | Freq: Every day | ORAL | Status: DC
Start: 2017-12-14 — End: 2017-12-15
  Administered 2017-12-14 – 2017-12-15 (×2): 1 via ORAL
  Filled 2017-12-14 (×2): qty 1

## 2017-12-14 MED ORDER — HYDROCODONE-ACETAMINOPHEN 5-325 MG PO TABS: 1.0000 | ORAL_TABLET | ORAL | Status: DC | PRN

## 2017-12-14 MED ORDER — TRAMADOL HCL 50 MG PO TABS
50.0000 mg | ORAL_TABLET | Freq: Four times a day (QID) | ORAL | Status: DC | PRN
Start: 2017-12-14 — End: 2017-12-14

## 2017-12-14 MED ORDER — LORAZEPAM 2 MG/ML IJ SOLN
0.5000 mg | Freq: Once | INTRAMUSCULAR | Status: AC
  Administered 2017-12-14: 0.5 mg via INTRAVENOUS
  Filled 2017-12-14: qty 1

## 2017-12-14 MED ORDER — METHYLPREDNISOLONE SODIUM SUCC 40 MG IJ SOLR
40.0000 mg | Freq: Every day | INTRAMUSCULAR | Status: DC
  Administered 2017-12-14: 40 mg via INTRAVENOUS
  Filled 2017-12-14: qty 1

## 2017-12-14 MED ORDER — INSULIN ASPART 100 UNIT/ML ~~LOC~~ SOLN: 0.0000 [IU] | Freq: Every day | SUBCUTANEOUS | Status: DC

## 2017-12-14 MED ORDER — MONTELUKAST SODIUM 10 MG PO TABS
10.0000 mg | ORAL_TABLET | Freq: Every day | ORAL | Status: DC
  Administered 2017-12-14 – 2017-12-15 (×2): 10 mg via ORAL
  Filled 2017-12-14 (×2): qty 1

## 2017-12-14 MED ORDER — ISOSORBIDE MONONITRATE ER 60 MG PO TB24
30.0000 mg | ORAL_TABLET | Freq: Every day | ORAL | Status: DC
  Administered 2017-12-14 – 2017-12-15 (×2): 30 mg via ORAL
  Filled 2017-12-14 (×2): qty 1

## 2017-12-14 MED ORDER — DIAZEPAM 5 MG PO TABS
5.0000 mg | ORAL_TABLET | Freq: Once | ORAL | Status: AC
  Administered 2017-12-14: 5 mg via ORAL
  Filled 2017-12-14: qty 1

## 2017-12-14 MED ORDER — FUROSEMIDE 40 MG PO TABS
40.0000 mg | ORAL_TABLET | Freq: Two times a day (BID) | ORAL | Status: DC
  Administered 2017-12-14 – 2017-12-15 (×3): 40 mg via ORAL
  Filled 2017-12-14 (×3): qty 1

## 2017-12-14 MED ORDER — OXYBUTYNIN CHLORIDE 5 MG PO TABS
5.0000 mg | ORAL_TABLET | Freq: Every day | ORAL | Status: DC
  Administered 2017-12-14: 5 mg via ORAL
  Filled 2017-12-14: qty 1

## 2017-12-14 MED ORDER — TRAZODONE HCL 50 MG PO TABS: 25.0000 mg | ORAL_TABLET | Freq: Every evening | ORAL | Status: DC | PRN

## 2017-12-14 MED ORDER — ASPIRIN EC 81 MG PO TBEC
81.0000 mg | DELAYED_RELEASE_TABLET | Freq: Every day | ORAL | Status: DC
  Administered 2017-12-14 – 2017-12-15 (×2): 81 mg via ORAL
  Filled 2017-12-14 (×2): qty 1

## 2017-12-14 MED ORDER — ALBUTEROL SULFATE (2.5 MG/3ML) 0.083% IN NEBU: 3.0000 mL | INHALATION_SOLUTION | Freq: Four times a day (QID) | RESPIRATORY_TRACT | Status: DC | PRN

## 2017-12-14 MED ORDER — POTASSIUM CHLORIDE CRYS ER 20 MEQ PO TBCR
40.0000 meq | EXTENDED_RELEASE_TABLET | Freq: Once | ORAL | Status: AC
  Administered 2017-12-14: 40 meq via ORAL
  Filled 2017-12-14: qty 2

## 2017-12-14 MED ORDER — ONDANSETRON HCL 4 MG/2ML IJ SOLN: 4.0000 mg | Freq: Four times a day (QID) | INTRAMUSCULAR | Status: DC | PRN

## 2017-12-14 MED ORDER — KETOROLAC TROMETHAMINE 30 MG/ML IJ SOLN
30.0000 mg | Freq: Three times a day (TID) | INTRAMUSCULAR | Status: DC | PRN
  Administered 2017-12-14: 30 mg via INTRAVENOUS
  Filled 2017-12-14: qty 1

## 2017-12-14 MED ORDER — CYCLOBENZAPRINE HCL 10 MG PO TABS
10.0000 mg | ORAL_TABLET | Freq: Two times a day (BID) | ORAL | Status: DC | PRN
Start: 2017-12-14 — End: 2017-12-15

## 2017-12-14 MED ORDER — FAMOTIDINE 20 MG PO TABS
10.0000 mg | ORAL_TABLET | Freq: Every day | ORAL | Status: DC
  Administered 2017-12-14 – 2017-12-15 (×2): 10 mg via ORAL
  Filled 2017-12-14 (×2): qty 1

## 2017-12-14 MED ORDER — LOSARTAN POTASSIUM 50 MG PO TABS
50.0000 mg | ORAL_TABLET | Freq: Every day | ORAL | Status: DC
  Administered 2017-12-14 – 2017-12-15 (×2): 50 mg via ORAL
  Filled 2017-12-14 (×2): qty 1

## 2017-12-14 MED ORDER — INSULIN ASPART 100 UNIT/ML ~~LOC~~ SOLN
0.0000 [IU] | Freq: Three times a day (TID) | SUBCUTANEOUS | Status: DC
  Administered 2017-12-14 (×2): 3 [IU] via SUBCUTANEOUS
  Filled 2017-12-14 (×2): qty 1

## 2017-12-14 MED ORDER — PANTOPRAZOLE SODIUM 40 MG PO TBEC
40.0000 mg | DELAYED_RELEASE_TABLET | Freq: Every day | ORAL | Status: DC
  Administered 2017-12-14 – 2017-12-15 (×2): 40 mg via ORAL
  Filled 2017-12-14 (×2): qty 1

## 2017-12-14 MED ORDER — MECLIZINE HCL 25 MG PO TABS
25.0000 mg | ORAL_TABLET | Freq: Three times a day (TID) | ORAL | Status: DC | PRN
  Filled 2017-12-14: qty 1

## 2017-12-14 MED ORDER — BISACODYL 5 MG PO TBEC: 5.0000 mg | DELAYED_RELEASE_TABLET | Freq: Every day | ORAL | Status: DC | PRN

## 2017-12-14 MED ORDER — ZOLPIDEM TARTRATE 5 MG PO TABS
10.0000 mg | ORAL_TABLET | Freq: Every day | ORAL | Status: DC
  Administered 2017-12-14: 10 mg via ORAL
  Filled 2017-12-14: qty 2

## 2017-12-14 MED ORDER — ACETAMINOPHEN 325 MG PO TABS: 650.0000 mg | ORAL_TABLET | Freq: Four times a day (QID) | ORAL | Status: DC | PRN

## 2017-12-14 MED ORDER — ONDANSETRON HCL 4 MG PO TABS: 4.0000 mg | ORAL_TABLET | Freq: Four times a day (QID) | ORAL | Status: DC | PRN

## 2017-12-14 MED ORDER — AMLODIPINE BESYLATE 5 MG PO TABS
10.0000 mg | ORAL_TABLET | Freq: Every day | ORAL | Status: DC
  Administered 2017-12-14 – 2017-12-15 (×2): 10 mg via ORAL
  Filled 2017-12-14 (×2): qty 2

## 2017-12-14 MED ORDER — ACETAMINOPHEN 650 MG RE SUPP: 650.0000 mg | Freq: Four times a day (QID) | RECTAL | Status: DC | PRN

## 2017-12-14 NOTE — Progress Notes (Signed)
Patient ID: Felicia Woods, female   DOB: 24-May-1956, 62 y.o.   MRN: [id number]  [employer] PROGRESS NOTE  Felicia Woods YTK:[id number] DOB: Aug 30, 1955 DOA: 12/13/2017 PCP: Lavera Guise, MD  HPI/Subjective: Patient states that she is in a lot of pain.  Received pain medications prior to going down for MRI.  States that she feels a little bit better with the pain medication.  Pain severe in nature goes around to her back from her chest.  No coughing.  Only short of breath when she has the pain.  Objective: Vitals:   12/14/17 0342 12/14/17 0733  BP: (!) 161/101 129/82  Pulse: (!) 57 64  Resp: 18 17  Temp: 97.9 F (36.6 C) 98 F (36.7 C)  SpO2: 96% 98%    Filed Weights   12/13/17 2145  Weight: 102.1 kg (225 lb)    ROS: Review of Systems  Constitutional: Negative for chills and fever.  Eyes: Negative for blurred vision.  Respiratory: Negative for cough and shortness of breath.   Cardiovascular: Positive for chest pain.  Gastrointestinal: Negative for abdominal pain, constipation, diarrhea, nausea and vomiting.  Genitourinary: Negative for dysuria.  Musculoskeletal: Positive for back pain. Negative for joint pain.  Neurological: Negative for dizziness and headaches.   Exam: Physical Exam  Constitutional: She is oriented to person, place, and time.  HENT:  Nose: No mucosal edema.  Mouth/Throat: No oropharyngeal exudate or posterior oropharyngeal edema.  Eyes: Pupils are equal, round, and reactive to light. Conjunctivae, EOM and lids are normal.  Neck: No JVD present. Carotid bruit is not present. No edema present. No thyroid mass and no thyromegaly present.  Cardiovascular: S1 normal and S2 normal. Exam reveals no gallop.  No murmur heard. Pulses:      Dorsalis pedis pulses are 2+ on the right side, and 2+ on the left side.  Reproducible chest pain palpating over the lower chest.  Respiratory: No respiratory distress. She has no wheezes. She has no rhonchi. She has  no rales.  GI: Soft. Bowel sounds are normal. There is no tenderness.  Musculoskeletal:       Right ankle: She exhibits no swelling.       Left ankle: She exhibits no swelling.  Lymphadenopathy:    She has no cervical adenopathy.  Neurological: She is alert and oriented to person, place, and time. No cranial nerve deficit.  Skin: Skin is warm. No rash noted. Nails show no clubbing.  Psychiatric: She has a normal mood and affect.      Data Reviewed: Basic Metabolic Panel: Recent Labs  Lab 12/13/17 2200 12/14/17 0535  NA 139 139  K 3.2* 3.5  CL 105 108  CO2 25 24  GLUCOSE 138* 144*  BUN 15 16  CREATININE 0.77 0.57  CALCIUM 8.8* 8.6*  CBC: Recent Labs  Lab 12/13/17 2200 12/14/17 0535  WBC 9.7 7.5  NEUTROABS 6.3  --   HGB 11.8* 11.3*  HCT 36.0 33.9*  MCV 77.9* 77.3*  PLT 200 194   Cardiac Enzymes: Recent Labs  Lab 12/13/17 2200  TROPONINI <0.03   BNP (last 3 results) Recent Labs    11/02/17 1033  BNP 44.0     CBG: Recent Labs  Lab 12/14/17 0202 12/14/17 0739  GLUCAP 118* 152*     Studies: Dg Chest 2 View  Result Date: 12/13/2017 CLINICAL DATA:  Chest pain and shortness of breath. EXAM: CHEST - 2 VIEW COMPARISON:  November 01, 2017 FINDINGS: No pneumothorax. Suspected left retrocardiac opacity  somewhat rounded and nodular in appearance on the lateral view. The heart, hila, mediastinum, lungs, and pleura are otherwise unremarkable. IMPRESSION: Somewhat rounded opacity on the lateral view is thought to be in the retrocardiac region. Infiltrate is favored over a nodule. Recommend a short-term follow-up to ensure resolution. Electronically Signed   By: Dorise Bullion III M.D   On: 12/13/2017 22:54   Mr Thoracic Spine Wo Contrast  Result Date: 12/14/2017 CLINICAL DATA:  Acute onset of severe chest pain, recent cardiac catheterization. Evaluate for spinal source. EXAM: MRI THORACIC SPINE WITHOUT CONTRAST TECHNIQUE: Multiplanar, multisequence MR imaging of the  thoracic spine was performed. No intravenous contrast was administered. COMPARISON:  None. FINDINGS: Alignment:  Physiologic. Vertebrae: No fracture, evidence of discitis, or bone lesion. Cord:  Normal signal and morphology. Paraspinal and other soft tissues: Small BILATERAL pleural effusions. No paravertebral mass. Disc levels: No significant disc protrusion or spinal stenosis. IMPRESSION: Unremarkable thoracic spine MRI. Electronically Signed   By: Staci Righter M.D.   On: 12/14/2017 12:32    Scheduled Meds: . amLODipine  10 mg Oral Daily  . aspirin EC  81 mg Oral Daily  . bisoprolol-hydrochlorothiazide  1 tablet Oral Daily  . clopidogrel  75 mg Oral Daily  . docusate sodium  100 mg Oral BID  . estradiol  0.5 mg Oral Daily  . famotidine  10 mg Oral Daily  . fluticasone furoate-vilanterol  1 puff Inhalation Daily  . furosemide  40 mg Oral BID  . heparin  5,000 Units Subcutaneous Q8H  . insulin aspart  0-15 Units Subcutaneous TID WC  . insulin aspart  0-5 Units Subcutaneous QHS  . isosorbide mononitrate  30 mg Oral Daily  . losartan  50 mg Oral Daily  . methylPREDNISolone (SOLU-MEDROL) injection  40 mg Intravenous Daily  . montelukast  10 mg Oral Daily  . oxybutynin  5 mg Oral QPC supper  . pantoprazole  40 mg Oral Daily  . sertraline  100 mg Oral Daily  . simvastatin  10 mg Oral Daily  . zolpidem  10 mg Oral QHS   Continuous Infusions:  Assessment/Plan:  1. Reproducible chest pain and back pain.  Start Solu-Medrol 40 mg IV daily.  Continue Toradol as needed.  Hopefully tomorrow she will be feeling a lot better.  MRI thoracic spine negative. 2. History of coronary artery disease.  Continue aspirin Plavix simvastatin and bisoprolol 3. Essential hypertension continue Norvasc, bisoprolol hydrochlorothiazide, losartan 4. Type 2 diabetes mellitus on metformin as outpatient.  Put on sliding scale here. 5. GERD on Protonix 6. Hyperlipidemia unspecified on simvastatin 7. Abnormal finding  on chest x-ray.  Will need follow-up x-ray as outpatient versus CT scan.  Procalcitonin negative and patient not coughing and white count normal so we will hold off on antibiotics.  Code Status:     Code Status Orders  (From admission, onward)        Start     Ordered   12/14/17 0144  Full code  Continuous     12/14/17 0143    Code Status History    Date Active Date Inactive Code Status Order ID Comments User Context   11/23/2017 1254 11/23/2017 1801 Full Code [id number]  Corey Skains, MD Inpatient   11/02/2017 0216 11/03/2017 1818 Full Code [id number]  Arta Silence, MD Inpatient   02/16/2017 1024 02/17/2017 1245 Full Code [id number]  Yolonda Kida, MD Inpatient      Disposition Plan: Likely home tomorrow  Consultants:  Cardiology  Time spent: 28 minutes  [place]

## 2017-12-14 NOTE — Plan of Care (Signed)
  Problem: Education: Goal: Knowledge of General Education information will improve Outcome: Progressing   Problem: Health Behavior/Discharge Planning: Goal: Ability to manage health-related needs will improve Outcome: Progressing   Problem: Clinical Measurements: Goal: Will remain free from infection Outcome: Progressing   

## 2017-12-14 NOTE — Progress Notes (Signed)
Pt was unable to have MRI even after taking valium, Dr. Leslye Peer notified and received orders for IV ativan. Will continue to monitor.

## 2017-12-14 NOTE — Care Management (Signed)
No discharge needs identified by members of the care team 

## 2017-12-14 NOTE — H&P (Signed)
[facility] NAME: Felicia Woods    MR#:  [id number]  DATE OF BIRTH:  Jul 18, 1955  DATE OF ADMISSION:  12/13/2017  PRIMARY CARE PHYSICIAN: Lavera Guise, MD   REQUESTING/REFERRING PHYSICIAN:   CHIEF COMPLAINT:   Chief Complaint  Patient presents with  . Chest Pain    HISTORY OF PRESENT ILLNESS: Felicia Woods  is a 62 y.o. female with a known history of asthma, coronary artery disease, diabetes type 2, hypertension, hyperlipidemia, ovarian cancer status post treatment. Patient presented to emergency room for acute onset of severe, sharp central/posterior chest pain, started at rest, just few minutes before arrival to emergency room.  Patient states that the pain radiates to the back following the rib lines,bilaterally.  Pain seems to be positional, is worse with twisting of the spine and when sitting up.  It improves when lying down flat in the bed.  It does not get worse with deep breathing.  Not related to meals.  Nitroglycerin, aspirin and morphine did not help with her pain.  No nausea, diaphoresis or shortness of breath.  No fever or chills no cough.  Patient states she never had similar such severe pain before. Patient underwent a recent cardiac catheterization, just a month ago.  While patient still has coronary artery disease, no intervention was done at that time and medical management was recommended.  Recent stress test, done 2 months ago was negative for ischemic changes. Test done emergency room, including CBC and CMP are essentially unremarkable.  Troponin level is lower than 0.03. EKG shows normal sinus rhythm with heart rate of 73; RBBB, noted on old EKG; no acute ST-T changes. Chest x-ray, reviewed by myself, is negative for any acute cardiopulmonary disease. Patient is admitted for further evaluation and treatment.  PAST MEDICAL HISTORY:   Past Medical History:  Diagnosis Date  . Asthma   . Coronary artery  disease   . Diabetes mellitus without complication ([place])   . Hyperlipidemia   . Hypertension   . MI (myocardial infarction) ([place])   . Migraine headache with aura   . 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    PAST SURGICAL HISTORY:  Past Surgical History:  Procedure Laterality Date  . ABDOMINAL HYSTERECTOMY    . CARDIAC CATHETERIZATION    . CARDIAC CATHETERIZATION N/A 10/18/2015   Procedure: Left Heart Cath and Cors/Grafts Angiography;  Surgeon: Lorretta Harp, MD;  Location: [facility];  Service: Cardiovascular;  Laterality: N/A;  . CARDIAC CATHETERIZATION N/A 10/18/2015   Procedure: Coronary Stent Intervention;  Surgeon: Lorretta Harp, MD;  Location: [facility];  Service: Cardiovascular;  Laterality: N/A;  . CARDIAC SURGERY    . CHOLECYSTECTOMY    . CORONARY ANGIOPLASTY    . CORONARY ARTERY BYPASS GRAFT     4 vessels - 2010  . CORONARY STENT INTERVENTION N/A 02/16/2017   Procedure: CORONARY STENT INTERVENTION;  Surgeon: Yolonda Kida, MD;  Location: [facility];  Service: Cardiovascular;  Laterality: N/A;  . LEFT HEART CATH AND CORONARY ANGIOGRAPHY Left 02/16/2017   Procedure: LEFT HEART CATH AND CORONARY ANGIOGRAPHY;  Surgeon: Corey Skains, MD;  Location: [facility];  Service: Cardiovascular;  Laterality: Left;  . LEFT HEART CATH AND CORS/GRAFTS ANGIOGRAPHY Left 11/23/2017   Procedure: LEFT HEART CATH AND CORS/GRAFTS ANGIOGRAPHY;  Surgeon: Corey Skains, MD;  Location: [facility];  Service: Cardiovascular;  Laterality: Left;  SOCIAL HISTORY:  Social History   Tobacco Use  . Smoking status: Never Smoker  . Smokeless tobacco: Never Used  Substance Use Topics  . Alcohol use: No    Alcohol/week: 0.0 oz    FAMILY HISTORY: No family history on file.  DRUG ALLERGIES:  Allergies  Allergen Reactions  . Penicillin G Anaphylaxis and Other (See Comments)    Has patient had a PCN reaction causing immediate rash,  facial/tongue/throat swelling, SOB or lightheadedness with hypotension: OZY:[id number]} Has patient had a PCN reaction causing severe rash involving mucus membranes or skin necrosis: no:[id number]} Has patient had a PCN reaction that required hospitalization no:[id number]} Has patient had a PCN reaction occurring within the last 10 years: no:[id number]} If all of the above answers are "NO", then may proceed with Cephalosporin use.    REVIEW OF SYSTEMS:   CONSTITUTIONAL: No fever, fatigue or weakness.  EYES: No blurred or double vision.  EARS, NOSE, AND THROAT: No tinnitus or ear pain.  RESPIRATORY: No cough, shortness of breath, wheezing or hemoptysis.  CARDIOVASCULAR: Positive for chest pain.  No orthopnea, edema.  GASTROINTESTINAL: No nausea, vomiting, diarrhea or abdominal pain.  GENITOURINARY: No dysuria, hematuria.  ENDOCRINE: No polyuria, nocturia,  HEMATOLOGY: No bleeding SKIN: No rash or lesion. MUSCULOSKELETAL: No joint pain.   NEUROLOGIC: No generalized weakness.  PSYCHIATRY: Positive history of anxiety and depression.   MEDICATIONS AT HOME:  Prior to Admission medications   Medication Sig Start Date End Date Taking? Authorizing Provider  albuterol (PROVENTIL HFA;VENTOLIN HFA) 108 (90 Base) MCG/ACT inhaler Inhale 2 puffs into the lungs every 6 (six) hours as needed for wheezing or shortness of breath.   Yes [provider]  amLODipine (NORVASC) 10 MG tablet Take 1 tablet (10 mg total) by mouth daily. 07/18/17  Yes Ronnell Freshwater, NP  aspirin EC 81 MG tablet Take 81 mg by mouth daily.   Yes [provider]  bisoprolol-hydrochlorothiazide (ZIAC) 2.5-6.25 MG tablet Take 1 tablet by mouth daily. 07/18/17  Yes Boscia, Greer Ee, NP  budesonide-formoterol (SYMBICORT) 160-4.5 MCG/ACT inhaler Inhale 2 puffs into the lungs 2 (two) times daily.   Yes [provider]  clopidogrel (PLAVIX) 75 MG tablet Take 75 mg by mouth daily.   Yes [provider]   cyclobenzaprine (FLEXERIL) 10 MG tablet Take 10 mg by mouth 2 (two) times daily as needed for muscle spasms.    Yes [provider]  estradiol (ESTRACE) 0.5 MG tablet Take 1 tablet (0.5 mg total) by mouth daily. 07/18/17  Yes Boscia, Greer Ee, NP  furosemide (LASIX) 40 MG tablet Take 40 mg by mouth 2 (two) times daily.    Yes [provider]  glimepiride (AMARYL) 1 MG tablet Take 1 tablet (1 mg total) by mouth daily. With biggest meal 09/09/17  Yes Boscia, Heather E, NP  isosorbide mononitrate (IMDUR) 30 MG 24 hr tablet Take 1 tablet (30 mg total) by mouth daily. 11/03/17 11/03/18 Yes Salary, Avel Peace, MD  losartan (COZAAR) 50 MG tablet Take 1 tablet (50 mg total) by mouth daily. 09/09/17  Yes Ronnell Freshwater, NP  meclizine (ANTIVERT) 25 MG tablet Take 1 tablet (25 mg total) by mouth 3 (three) times daily as needed for dizziness. 10/22/15  Yes Bhagat, Bhavinkumar, PA  metFORMIN (GLUCOPHAGE) 500 MG tablet Take by mouth 2 (two) times daily with a meal.   Yes [provider]  montelukast (SINGULAIR) 10 MG tablet Take 1 tablet (10 mg total) by mouth daily. 07/18/17  Yes  Ronnell Freshwater, NP  nitroGLYCERIN (NITROSTAT) 0.4 MG SL tablet Place 0.4 mg under the tongue every 5 (five) minutes x 3 doses as needed for chest pain. *If no relief call MD or go to Emergency Room*   Yes [provider]  omeprazole (PRILOSEC) 40 MG capsule Take 1 capsule (40 mg total) by mouth daily with supper. 07/29/17  Yes Boscia, Greer Ee, NP  oxybutynin (DITROPAN) 5 MG tablet Take 1 tablet (5 mg total) by mouth daily after supper. 09/09/17  Yes Boscia, Greer Ee, NP  ranitidine (ZANTAC) 150 MG tablet Take 1 tablet (150 mg total) by mouth 2 (two) times daily. 07/18/17  Yes Boscia, Greer Ee, NP  sertraline (ZOLOFT) 100 MG tablet Take 100 mg by mouth daily.  12/27/14  Yes [provider]  simvastatin (ZOCOR) 10 MG tablet Take 10 mg by mouth daily.   Yes [provider]  traMADol (ULTRAM)  50 MG tablet Take 50 mg by mouth every 6 (six) hours as needed for moderate pain.   Yes [provider]  zolpidem (AMBIEN) 10 MG tablet Take 10 mg by mouth at bedtime. 10/08/15  Yes [provider]  atorvastatin (LIPITOR) 80 MG tablet Take 1 tablet (80 mg total) by mouth daily at 6 PM. Patient not taking: Reported on 03/10/2017 10/30/15   Lorretta Harp, MD      PHYSICAL EXAMINATION:   VITAL SIGNS: Blood pressure 137/70, pulse 65, temperature 98.3 F (36.8 C), temperature source Oral, resp. rate 13, height '5\' 7"'  (1.702 m), weight 102.1 kg (225 lb), SpO2 98 %.  GENERAL:  62 y.o.-year-old patient lying in the bed with moderate distress, secondary to pain.  EYES: Pupils equal, round, reactive to light and accommodation. No scleral icterus. Extraocular muscles intact.  HEENT: Head atraumatic, normocephalic. Oropharynx and nasopharynx clear.  NECK:  Supple, no jugular venous distention. No thyroid enlargement, no tenderness.  LUNGS: Reduced breath sounds bilaterally, no wheezing, rales,rhonchi or crepitation. No use of accessory muscles of respiration.  CARDIOVASCULAR: S1, S2 normal. No S3/S4.  ABDOMEN: Soft, nontender, nondistended. Bowel sounds present. No organomegaly or mass.  EXTREMITIES: No pedal edema, cyanosis, or clubbing.  NEUROLOGIC: No focal weakness. PSYCHIATRIC: The patient is alert and oriented x 3.  SKIN: No obvious rash, lesion, or ulcer.   LABORATORY PANEL:   CBC Recent Labs  Lab 12/13/17 2200  WBC 9.7  HGB 11.8*  HCT 36.0  PLT 200  MCV 77.9*  MCH 25.7*  MCHC 32.9  RDW 17.3*  LYMPHSABS 2.6  MONOABS 0.7  EOSABS 0.1  BASOSABS 0.1   ------------------------------------------------------------------------------------------------------------------  Chemistries  Recent Labs  Lab 12/13/17 2200  NA 139  K 3.2*  CL 105  CO2 25  GLUCOSE 138*  BUN 15  CREATININE 0.77  CALCIUM 8.8*    ------------------------------------------------------------------------------------------------------------------ estimated creatinine clearance is 91.8 mL/min (by C-G formula based on SCr of 0.77 mg/dL). ------------------------------------------------------------------------------------------------------------------ No results for input(s): TSH, T4TOTAL, T3FREE, THYROIDAB in the last 72 hours.  Invalid input(s): FREET3   Coagulation profile No results for input(s): INR, PROTIME in the last 168 hours. ------------------------------------------------------------------------------------------------------------------- No results for input(s): DDIMER in the last 72 hours. -------------------------------------------------------------------------------------------------------------------  Cardiac Enzymes Recent Labs  Lab 12/13/17 2200  TROPONINI <0.03   ------------------------------------------------------------------------------------------------------------------ Invalid input(s): POCBNP  ---------------------------------------------------------------------------------------------------------------  Urinalysis No results found for: COLORURINE, APPEARANCEUR, LABSPEC, PHURINE, GLUCOSEU, HGBUR, BILIRUBINUR, KETONESUR, PROTEINUR, UROBILINOGEN, NITRITE, LEUKOCYTESUR   RADIOLOGY: Dg Chest 2 View  Result Date: 12/13/2017 CLINICAL DATA:  Chest pain and shortness of breath.  EXAM: CHEST - 2 VIEW COMPARISON:  November 01, 2017 FINDINGS: No pneumothorax. Suspected left retrocardiac opacity somewhat rounded and nodular in appearance on the lateral view. The heart, hila, mediastinum, lungs, and pleura are otherwise unremarkable. IMPRESSION: Somewhat rounded opacity on the lateral view is thought to be in the retrocardiac region. Infiltrate is favored over a nodule. Recommend a short-term follow-up to ensure resolution. Electronically Signed   By: Dorise Bullion III M.D   On: 12/13/2017 22:54     EKG: Orders placed or performed during the hospital encounter of 12/13/17  . EKG 12-Lead  . EKG 12-Lead    IMPRESSION AND PLAN:  1.  Atypical chest pain, seems to be musculoskeletal in nature.  Will use Toradol IV as needed for pain.  Will check MRI of the thoracic spine to rule out thoracic radiculopathy.  Given the patient's extensive cardiac history, we will continue to monitor closely on telemetry and follow the troponin levels, to rule out ACS.  Cardiology is consulted for further evaluation and treatment. 2.  Coronary artery disease, continue medical management per cardiology. 3.  Diabetes type 2.  Will start low-carb diet and monitor blood sugars before meals and at bedtime.  Will use insulin treatment during the hospital stay. 4.  Hypertension, stable, restart home medications.  All the records are reviewed and case discussed with ED provider. Management plans discussed with the patient, family and they are in agreement.  CODE STATUS: FULL Code Status History    Date Active Date Inactive Code Status Order ID Comments User Context   11/23/2017 1254 11/23/2017 1801 Full Code [id number]  Corey Skains, MD Inpatient   11/02/2017 0216 11/03/2017 1818 Full Code [id number]  Arta Silence, MD Inpatient   02/16/2017 1024 02/17/2017 1245 Full Code [id number]  Yolonda Kida, MD Inpatient       TOTAL TIME TAKING CARE OF THIS PATIENT: 45 minutes.    Amelia Jo M.D on 12/14/2017 at 1:42 AM  Between 7am to 6pm - Pager - [phone]  After 6pm go to [email or web address] - password [email or web address] [facility]  [facility]  Office  [phone]  CC: Primary care physician; Lavera Guise, MD

## 2017-12-14 NOTE — Progress Notes (Signed)
Patient was transferred from the ED following c/o chest pain . On admission to the unit patient was A&O X4, ambulatory, NSR and denied being in pain. Patient and significant other was oriented to the unit, and care plan reviewed .  Arta Silence, MD was notified of K=3.2, and patient request to have something to take before going for scheduled MRI. Potassium supplement and one time dose Valium administered per order.  Patient has no acute event overnight.Husband at the bedside

## 2017-12-14 NOTE — Consult Note (Signed)
Cardiology Consultation Note    Patient ID: Felicia Woods, MRN: [id number], DOB/AGE: 12-31-1955 62 y.o. Admit date: 12/13/2017   Date of Consult: 12/14/2017 Primary Physician: Lavera Guise, MD Primary Cardiologist: Dr. Nehemiah Massed  Chief Complaint: chest pain Reason for Consultation: chest pain Requesting MD: Dr. Leslye Peer  HPI: Felicia Woods is a 62 y.o. female with history of coronary artery disease, diabetes, hypertension, hyperlipidemia who presented to the emergency room with complaints of severe sharp midsternal and posterior chest pain.  This occurred at rest.  Radiates to the back following rib lines.  Pain is positional.  Worse when moving from side to side and when sitting up.  EKG reveals sinus rhythm with right bundle branch block.  No appreciable change from her baseline.  Cardiac markers are normal.  Left cardiac catheterization less than 1 month ago on Nov 23, 2017 which showed 9% mid to distal LAD stenosis, 90% ramus intermedius followed by 100% ramus intermedius.  90% proximal circumflex, 100% distal RCA with a 95% proximal RCA.  She had a saphenous vein graft to to the PDA was patent with 45% stenosis, saphenous vein graft to the obtuse marginal which was unremarkable, left internal mammary to the LAD which was widely patent, saphenous vein graft to the first diagonal with a patent proximal stent which was a drug-eluting stent 2.5 x 18 mm Xience placed in August 2018 and a 65% mid graft lesion.  There is no evidence of ischemia in the distribution.  Medical management was recommended.  She was admitted and ruled out for myocardial infarction with serial cardiac markers.  Similar complaints on the first 2019 and ruled out for myocardial infarction.  She was treated medically for this.  Cardiac cath listed above was done as an outpatient.  Pain is atypical for angina.  She continues to be treated with dual antiplatelet therapy with aspirin and Plavix, amlodipine 10 mg daily, isosorbide  mononitrate 30 mg daily.  She is on Norco, Flexeril.  Is also on bisoprolol-hydrochlorothiazide.  Chest x-ray revealed no pneumothorax.  Possible infiltrate retrocardiac space.  Patient refused MRI today due to anxiety while the test was being done.  Pain is extremely atypical for angina.  He is completely read all with deep palpation in her midsternal region.  She is ruled out for myocardial infarction.  Past Medical History:  Diagnosis Date  . Asthma   . Coronary artery disease   . Diabetes mellitus without complication ([place])   . Hyperlipidemia   . Hypertension   . MI (myocardial infarction) ([place])   . Migraine headache with aura   . 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      Surgical History:  Past Surgical History:  Procedure Laterality Date  . ABDOMINAL HYSTERECTOMY    . CARDIAC CATHETERIZATION    . CARDIAC CATHETERIZATION N/A 10/18/2015   Procedure: Left Heart Cath and Cors/Grafts Angiography;  Surgeon: Lorretta Harp, MD;  Location: [facility];  Service: Cardiovascular;  Laterality: N/A;  . CARDIAC CATHETERIZATION N/A 10/18/2015   Procedure: Coronary Stent Intervention;  Surgeon: Lorretta Harp, MD;  Location: [facility];  Service: Cardiovascular;  Laterality: N/A;  . CARDIAC SURGERY    . CHOLECYSTECTOMY    . CORONARY ANGIOPLASTY    . CORONARY ARTERY BYPASS GRAFT     4 vessels - 2010  . CORONARY STENT INTERVENTION N/A 02/16/2017   Procedure: CORONARY STENT INTERVENTION;  Surgeon: Yolonda Kida, MD;  Location: [facility];  Service: Cardiovascular;  Laterality: N/A;  . LEFT HEART CATH AND CORONARY ANGIOGRAPHY Left 02/16/2017   Procedure: LEFT HEART CATH AND CORONARY ANGIOGRAPHY;  Surgeon: Corey Skains, MD;  Location: [facility];  Service: Cardiovascular;  Laterality: Left;  . LEFT HEART CATH AND CORS/GRAFTS ANGIOGRAPHY Left 11/23/2017   Procedure: LEFT HEART CATH AND CORS/GRAFTS ANGIOGRAPHY;  Surgeon: Corey Skains, MD;  Location:  [facility];  Service: Cardiovascular;  Laterality: Left;     Home Meds: Prior to Admission medications   Medication Sig Start Date End Date Taking? Authorizing Provider  albuterol (PROVENTIL HFA;VENTOLIN HFA) 108 (90 Base) MCG/ACT inhaler Inhale 2 puffs into the lungs every 6 (six) hours as needed for wheezing or shortness of breath.   Yes [provider]  amLODipine (NORVASC) 10 MG tablet Take 1 tablet (10 mg total) by mouth daily. 07/18/17  Yes Ronnell Freshwater, NP  aspirin EC 81 MG tablet Take 81 mg by mouth daily.   Yes [provider]  bisoprolol-hydrochlorothiazide (ZIAC) 2.5-6.25 MG tablet Take 1 tablet by mouth daily. 07/18/17  Yes Boscia, Greer Ee, NP  budesonide-formoterol (SYMBICORT) 160-4.5 MCG/ACT inhaler Inhale 2 puffs into the lungs 2 (two) times daily.   Yes [provider]  clopidogrel (PLAVIX) 75 MG tablet Take 75 mg by mouth daily.   Yes [provider]  cyclobenzaprine (FLEXERIL) 10 MG tablet Take 10 mg by mouth 2 (two) times daily as needed for muscle spasms.    Yes [provider]  estradiol (ESTRACE) 0.5 MG tablet Take 1 tablet (0.5 mg total) by mouth daily. 07/18/17  Yes Boscia, Greer Ee, NP  furosemide (LASIX) 40 MG tablet Take 40 mg by mouth 2 (two) times daily.    Yes [provider]  glimepiride (AMARYL) 1 MG tablet Take 1 tablet (1 mg total) by mouth daily. With biggest meal 09/09/17  Yes Boscia, Heather E, NP  isosorbide mononitrate (IMDUR) 30 MG 24 hr tablet Take 1 tablet (30 mg total) by mouth daily. 11/03/17 11/03/18 Yes Salary, Avel Peace, MD  losartan (COZAAR) 50 MG tablet Take 1 tablet (50 mg total) by mouth daily. 09/09/17  Yes Ronnell Freshwater, NP  meclizine (ANTIVERT) 25 MG tablet Take 1 tablet (25 mg total) by mouth 3 (three) times daily as needed for dizziness. 10/22/15  Yes Bhagat, Bhavinkumar, PA  metFORMIN (GLUCOPHAGE) 500 MG tablet Take by mouth 2 (two) times daily with a meal.   Yes [provider]  montelukast (SINGULAIR) 10 MG tablet Take 1 tablet (10 mg total) by mouth daily. 07/18/17  Yes Boscia, Greer Ee, NP  nitroGLYCERIN (NITROSTAT) 0.4 MG SL tablet Place 0.4 mg under the tongue every 5 (five) minutes x 3 doses as needed for chest pain. *If no relief call MD or go to Emergency Room*   Yes [provider]  omeprazole (PRILOSEC) 40 MG capsule Take 1 capsule (40 mg total) by mouth daily with supper. 07/29/17  Yes Boscia, Greer Ee, NP  oxybutynin (DITROPAN) 5 MG tablet Take 1 tablet (5 mg total) by mouth daily after supper. 09/09/17  Yes Boscia, Greer Ee, NP  ranitidine (ZANTAC) 150 MG tablet Take 1 tablet (150 mg total) by mouth 2 (two) times daily. 07/18/17  Yes Boscia, Greer Ee, NP  sertraline (ZOLOFT) 100 MG tablet Take 100 mg by mouth daily.  12/27/14  Yes [provider]  simvastatin (ZOCOR) 10 MG tablet Take 10 mg by mouth daily.   Yes [provider]  traMADol (ULTRAM) 50 MG tablet Take 50 mg by mouth every 6 (six) hours as needed for moderate pain.   Yes [provider]  zolpidem (AMBIEN) 10 MG tablet Take 10 mg by mouth at bedtime. 10/08/15  Yes [provider]  atorvastatin (LIPITOR) 80 MG tablet Take 1 tablet (80 mg total) by mouth daily at 6 PM. Patient not taking: Reported on 03/10/2017 10/30/15   Lorretta Harp, MD    Inpatient Medications:  . amLODipine  10 mg Oral Daily  . aspirin EC  81 mg Oral Daily  . bisoprolol-hydrochlorothiazide  1 tablet Oral Daily  . clopidogrel  75 mg Oral Daily  . docusate sodium  100 mg Oral BID  . estradiol  0.5 mg Oral Daily  . famotidine  10 mg Oral Daily  . fluticasone furoate-vilanterol  1 puff Inhalation Daily  . furosemide  40 mg Oral BID  . heparin  5,000 Units Subcutaneous Q8H  . insulin aspart  0-15 Units Subcutaneous TID WC  . insulin aspart  0-5 Units Subcutaneous QHS  . isosorbide mononitrate  30 mg Oral Daily  . losartan  50 mg Oral Daily  . montelukast  10 mg Oral  Daily  . oxybutynin  5 mg Oral QPC supper  . pantoprazole  40 mg Oral Daily  . sertraline  100 mg Oral Daily  . simvastatin  10 mg Oral Daily  . zolpidem  10 mg Oral QHS     Allergies:  Allergies  Allergen Reactions  . Penicillin G Anaphylaxis and Other (See Comments)    Has patient had a PCN reaction causing immediate rash, facial/tongue/throat swelling, SOB or lightheadedness with hypotension: EZM:[id number]} Has patient had a PCN reaction causing severe rash involving mucus membranes or skin necrosis: no:[id number]} Has patient had a PCN reaction that required hospitalization no:[id number]} Has patient had a PCN reaction occurring within the last 10 years: no:[id number]} If all of the above answers are "NO", then may proceed with Cephalosporin use.    Social History   Socioeconomic History  . Marital status: Single    Spouse name: Not on file  . Number of children: Not on file  . Years of education: Not on file  . Highest education level: Not on file  Occupational History  . Not on file  Social Needs  . Financial resource strain: Not on file  . Food insecurity:    Worry: Not on file    Inability: Not on file  . Transportation needs:    Medical: Not on file    Non-medical: Not on file  Tobacco Use  . Smoking status: Never Smoker  . Smokeless tobacco: Never Used  Substance and Sexual Activity  . Alcohol use: No    Alcohol/week: 0.0 oz  . Drug use: No  . Sexual activity: Yes  Lifestyle  . Physical activity:    Days per week: Not on file    Minutes per session: Not on file  . Stress: Not on file  Relationships  . Social connections:    Talks on phone: Not on file    Gets together: Not on file    Attends religious service: Not on file    Active member of club or organization: Not on file    Attends meetings of clubs or organizations: Not on file    Relationship status: Not on file  . Intimate partner violence:    Fear of current or ex partner: Not on file  Emotionally abused: Not on file    Physically abused: Not on file    Forced sexual activity: Not on file  Other Topics Concern  . Not on file  Social History Narrative  . Not on file     No family history on file.   Review of Systems: A 12-system review of systems was performed and is negative except as noted in the HPI.  Labs: Recent Labs    12/13/17 2200  TROPONINI <0.03   Lab Results  Component Value Date   WBC 7.5 12/14/2017   HGB 11.3 (L) 12/14/2017   HCT 33.9 (L) 12/14/2017   MCV 77.3 (L) 12/14/2017   PLT 194 12/14/2017    Recent Labs  Lab 12/14/17 0535  NA 139  K 3.5  CL 108  CO2 24  BUN 16  CREATININE 0.57  CALCIUM 8.6*  GLUCOSE 144*   Lab Results  Component Value Date   CHOL 153 10/20/2015   HDL 31 (L) 10/20/2015   LDLCALC 97 10/20/2015   TRIG 123 10/20/2015   No results found for: DDIMER  Radiology/Studies:  Dg Chest 2 View  Result Date: 12/13/2017 CLINICAL DATA:  Chest pain and shortness of breath. EXAM: CHEST - 2 VIEW COMPARISON:  November 01, 2017 FINDINGS: No pneumothorax. Suspected left retrocardiac opacity somewhat rounded and nodular in appearance on the lateral view. The heart, hila, mediastinum, lungs, and pleura are otherwise unremarkable. IMPRESSION: Somewhat rounded opacity on the lateral view is thought to be in the retrocardiac region. Infiltrate is favored over a nodule. Recommend a short-term follow-up to ensure resolution. Electronically Signed   By: Dorise Bullion III M.D   On: 12/13/2017 22:54    Wt Readings from Last 3 Encounters:  12/13/17 102.1 kg (225 lb)  11/23/17 116.6 kg (257 lb)  11/02/17 116.9 kg (257 lb 12.8 oz)    EKG: This rhythm with right bundle branch block no ischemia  Physical Exam:  Blood pressure 129/82, pulse 64, temperature 98 F (36.7 C), resp. rate 17, height '5\' 7"'  (1.702 m), weight 102.1 kg (225 lb), SpO2 98 %. Body mass index is 35.24 kg/m. General: Well developed, well nourished, in no acute  distress. Head: Normocephalic, atraumatic, sclera non-icteric, no xanthomas, nares are without discharge.  Neck: Negative for carotid bruits. JVD not elevated. Lungs: Clear bilaterally to auscultation without wheezes, rales, or rhonchi. Breathing is unlabored. Heart: RRR with S1 S2. No murmurs, rubs, or gallops appreciated. Abdomen: Soft, non-tender, non-distended with normoactive bowel sounds. No hepatomegaly. No rebound/guarding. No obvious abdominal masses. Msk:  Strength and tone appear normal for age. Extremities: No clubbing or cyanosis. No edema.  Distal pedal pulses are 2+ and equal bilaterally. Neuro: Alert and oriented X 3. No facial asymmetry. No focal deficit. Moves all extremities spontaneously. Psych:  Responds to questions appropriately with a normal affect.     Assessment and Plan  62 year old female with history of coronary disease status post coronary bypass grafting as well as PCI of the saphenous vein graft to diagonal.  This was evaluated with a cath May 22 of this year showing no evidence of restenosis.  Patient has ruled out with no evidence of ischemia on electrocardiogram.  This appears to be musculoskeletal and nonischemic.  Would continue with current medications and consider anti-inflammatory therapy.  No further structural or ischemic cardiac work-up indicated at present.  Signed, Teodoro Spray MD 12/14/2017, 8:13 AM Pager: [phone]

## 2017-12-14 NOTE — Plan of Care (Signed)
  Problem: Education: Goal: Knowledge of General Education information will improve Outcome: Progressing   Problem: Health Behavior/Discharge Planning: Goal: Ability to manage health-related needs will improve Outcome: Progressing   Problem: Clinical Measurements: Goal: Ability to maintain clinical measurements within normal limits will improve Outcome: Progressing Goal: Will remain free from infection Outcome: Progressing Goal: Diagnostic test results will improve Outcome: Progressing Goal: Respiratory complications will improve Outcome: Progressing Goal: Cardiovascular complication will be avoided Outcome: Progressing   Problem: Activity: Goal: Risk for activity intolerance will decrease Outcome: Progressing   Problem: Nutrition: Goal: Adequate nutrition will be maintained Outcome: Progressing   Problem: Coping: Goal: Level of anxiety will decrease Outcome: Progressing   Problem: Elimination: Goal: Will not experience complications related to bowel motility Outcome: Progressing Goal: Will not experience complications related to urinary retention Outcome: Progressing   Problem: Pain Managment: Goal: General experience of comfort will improve Outcome: Progressing   Problem: Safety: Goal: Ability to remain free from injury will improve Outcome: Progressing   Problem: Skin Integrity: Goal: Risk for impaired skin integrity will decrease Outcome: Progressing   Problem: Education: Goal: Understanding of cardiac disease, CV risk reduction, and recovery process will improve Outcome: Progressing   Problem: Activity: Goal: Ability to tolerate increased activity will improve Outcome: Progressing   Problem: Cardiac: Goal: Ability to achieve and maintain adequate cardiovascular perfusion will improve Outcome: Progressing

## 2017-12-14 NOTE — Care Management CC44 (Signed)
Condition Code 44 Documentation Completed  Patient Details  Name: Dolorez Jeffrey MRN: [id number] Date of Birth: October 03, 1955   Condition Code 44 given:  Yes Patient signature on Condition Code 44 notice:  Yes Documentation of 2 MD's agreement:  Yes Code 44 added to claim:  Yes    Katrina Stack, RN 12/14/2017, 8:51 AM

## 2017-12-14 NOTE — Care Management Obs Status (Signed)
[facility] NOTIFICATION   Patient Details  Name: Felicia Woods MRN: [id number] Date of Birth: 05-20-1956   Medicare Observation Status Notification Given:  Yes    Katrina Stack, RN 12/14/2017, 8:51 AM

## 2017-12-15 ENCOUNTER — Ambulatory Visit: Payer: Self-pay | Admitting: Internal Medicine

## 2017-12-15 ENCOUNTER — Encounter: Payer: Self-pay | Admitting: *Deleted

## 2017-12-15 ENCOUNTER — Observation Stay: Payer: Medicare Other

## 2017-12-15 DIAGNOSIS — I2510 Atherosclerotic heart disease of native coronary artery without angina pectoris: Secondary | ICD-10-CM | POA: Diagnosis not present

## 2017-12-15 DIAGNOSIS — I10 Essential (primary) hypertension: Secondary | ICD-10-CM | POA: Diagnosis not present

## 2017-12-15 DIAGNOSIS — R079 Chest pain, unspecified: Secondary | ICD-10-CM | POA: Diagnosis not present

## 2017-12-15 DIAGNOSIS — R0789 Other chest pain: Secondary | ICD-10-CM | POA: Diagnosis not present

## 2017-12-15 DIAGNOSIS — J8410 Pulmonary fibrosis, unspecified: Secondary | ICD-10-CM | POA: Diagnosis not present

## 2017-12-15 DIAGNOSIS — E119 Type 2 diabetes mellitus without complications: Secondary | ICD-10-CM | POA: Diagnosis not present

## 2017-12-15 LAB — GLUCOSE, CAPILLARY
GLUCOSE-CAPILLARY: 91 mg/dL (ref 65–99)
Glucose-Capillary: 124 mg/dL — ABNORMAL HIGH (ref 65–99)

## 2017-12-15 MED ORDER — PREDNISONE 10 MG PO TABS: ORAL_TABLET | ORAL | 0 refills | Status: DC

## 2017-12-15 MED ORDER — PREDNISONE 20 MG PO TABS
20.0000 mg | ORAL_TABLET | Freq: Every day | ORAL | Status: DC
  Administered 2017-12-15: 20 mg via ORAL
  Filled 2017-12-15: qty 2

## 2017-12-15 NOTE — Discharge Summary (Signed)
[facility] NAME: Felicia Woods    MR#:  [id number]  DATE OF BIRTH:  07-24-1955  DATE OF ADMISSION:  12/13/2017 ADMITTING PHYSICIAN: Amelia Jo, MD  DATE OF DISCHARGE: 12/15/2017  PRIMARY CARE PHYSICIAN: Lavera Guise, MD    ADMISSION DIAGNOSIS:  Chest pain, unspecified type [R07.9] Chest pain [R07.9]  DISCHARGE DIAGNOSIS:  Active Problems:   Chest pain   SECONDARY DIAGNOSIS:   Past Medical History:  Diagnosis Date  . Asthma   . Coronary artery disease   . Diabetes mellitus without complication ([place])   . Hyperlipidemia   . Hypertension   . MI (myocardial infarction) ([place])   . Migraine headache with aura   . 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    HOSPITAL COURSE:   1.  Reproducible chest pain and back pain.  I started Solu-Medrol yesterday switch over to prednisone today.  MRI thoracic spine was negative.  Patient feeling better.  Quick prednisone taper upon going home. 2.  History of coronary artery disease continue aspirin, Plavix, simvastatin and bisoprolol. 3.  Essential hypertension continue Norvasc, bisoprolol Hydrocort thiazide and losartan 4.  Type 2 diabetes mellitus on metformin as outpatient.  Get rid of glimepiride. 5.  GERD on Protonix  6.  Hyperlipidemia unspecified on simvastatin 7.  Abnormal finding on chest x-ray.  I got a CT scan without contrast which showed retrocardiac fibrosis.     DISCHARGE CONDITIONS:   Satisfactory  CONSULTS OBTAINED:  Treatment Team:  Teodoro Spray, MD  DRUG ALLERGIES:   Allergies  Allergen Reactions  . Penicillin G Anaphylaxis and Other (See Comments)    Has patient had a PCN reaction causing immediate rash, facial/tongue/throat swelling, SOB or lightheadedness with hypotension: [id number]} Has patient had a PCN reaction causing severe rash involving mucus membranes or skin necrosis: no:[id number]} Has patient had a PCN reaction that required hospitalization  no:[id number]} Has patient had a PCN reaction occurring within the last 10 years: no:[id number]} If all of the above answers are "NO", then may proceed with Cephalosporin use.    DISCHARGE MEDICATIONS:   Allergies as of 12/15/2017      Reactions   Penicillin G Anaphylaxis, Other (See Comments)   Has patient had a PCN reaction causing immediate rash, facial/tongue/throat swelling, SOB or lightheadedness with hypotension: [id number]} Has patient had a PCN reaction causing severe rash involving mucus membranes or skin necrosis: no:[id number]} Has patient had a PCN reaction that required hospitalization no:[id number]} Has patient had a PCN reaction occurring within the last 10 years: no:[id number]} If all of the above answers are "NO", then may proceed with Cephalosporin use.      Medication List    STOP taking these medications   atorvastatin 80 MG tablet Commonly known as:  LIPITOR   glimepiride 1 MG tablet Commonly known as:  AMARYL     TAKE these medications   albuterol 108 (90 Base) MCG/ACT inhaler Commonly known as:  PROVENTIL HFA;VENTOLIN HFA Inhale 2 puffs into the lungs every 6 (six) hours as needed for wheezing or shortness of breath.   amLODipine 10 MG tablet Commonly known as:  NORVASC Take 1 tablet (10 mg total) by mouth daily.   aspirin EC 81 MG tablet Take 81 mg by mouth daily.   bisoprolol-hydrochlorothiazide 2.5-6.25 MG tablet Commonly known as:  ZIAC Take 1 tablet by mouth daily.   budesonide-formoterol 160-4.5 MCG/ACT inhaler Commonly known as:  SYMBICORT Inhale 2 puffs into the lungs 2 (  two) times daily.   clopidogrel 75 MG tablet Commonly known as:  PLAVIX Take 75 mg by mouth daily.   cyclobenzaprine 10 MG tablet Commonly known as:  FLEXERIL Take 10 mg by mouth 2 (two) times daily as needed for muscle spasms.   estradiol 0.5 MG tablet Commonly known as:  ESTRACE Take 1 tablet (0.5 mg total) by mouth daily.   furosemide 40 MG tablet Commonly known  as:  LASIX Take 40 mg by mouth 2 (two) times daily.   isosorbide mononitrate 30 MG 24 hr tablet Commonly known as:  IMDUR Take 1 tablet (30 mg total) by mouth daily.   losartan 50 MG tablet Commonly known as:  COZAAR Take 1 tablet (50 mg total) by mouth daily.   meclizine 25 MG tablet Commonly known as:  ANTIVERT Take 1 tablet (25 mg total) by mouth 3 (three) times daily as needed for dizziness.   metFORMIN 500 MG tablet Commonly known as:  GLUCOPHAGE Take by mouth 2 (two) times daily with a meal.   montelukast 10 MG tablet Commonly known as:  SINGULAIR Take 1 tablet (10 mg total) by mouth daily.   nitroGLYCERIN 0.4 MG SL tablet Commonly known as:  NITROSTAT Place 0.4 mg under the tongue every 5 (five) minutes x 3 doses as needed for chest pain. *If no relief call MD or go to Emergency Room*   omeprazole 40 MG capsule Commonly known as:  PRILOSEC Take 1 capsule (40 mg total) by mouth daily with supper.   oxybutynin 5 MG tablet Commonly known as:  DITROPAN Take 1 tablet (5 mg total) by mouth daily after supper.   predniSONE 10 MG tablet Commonly known as:  DELTASONE One tab po day for three days Start taking on:  12/16/2017   ranitidine 150 MG tablet Commonly known as:  ZANTAC Take 1 tablet (150 mg total) by mouth 2 (two) times daily.   sertraline 100 MG tablet Commonly known as:  ZOLOFT Take 100 mg by mouth daily.   simvastatin 10 MG tablet Commonly known as:  ZOCOR Take 10 mg by mouth daily.   traMADol 50 MG tablet Commonly known as:  ULTRAM Take 50 mg by mouth every 6 (six) hours as needed for moderate pain.   zolpidem 10 MG tablet Commonly known as:  AMBIEN Take 10 mg by mouth at bedtime.        DISCHARGE INSTRUCTIONS:   Follow up pmd as scheduled  If you experience worsening of your admission symptoms, develop shortness of breath, life threatening emergency, suicidal or homicidal thoughts you must seek medical attention immediately by calling 911  or calling your MD immediately  if symptoms less severe.  You Must read complete instructions/literature along with all the possible adverse reactions/side effects for all the Medicines you take and that have been prescribed to you. Take any new Medicines after you have completely understood and accept all the possible adverse reactions/side effects.   Please note  You were cared for by a hospitalist during your hospital stay. If you have any questions about your discharge medications or the care you received while you were in the hospital after you are discharged, you can call the unit and asked to speak with the hospitalist on call if the hospitalist that took care of you is not available. Once you are discharged, your primary care physician will handle any further medical issues. Please note that NO REFILLS for any discharge medications will be authorized once you are discharged, as  it is imperative that you return to your primary care physician (or establish a relationship with a primary care physician if you do not have one) for your aftercare needs so that they can reassess your need for medications and monitor your lab values.    Today   CHIEF COMPLAINT:   Chief Complaint  Patient presents with  . Chest Pain    HISTORY OF PRESENT ILLNESS:  Felicia Woods  is a 62 y.o. female with a known history of cad presented with chest pain   VITAL SIGNS:  Blood pressure (!) 149/89, pulse 76, temperature 97.6 F (36.4 C), resp. rate 18, height '5\' 7"'  (1.702 m), weight 116.2 kg (256 lb 1.6 oz), SpO2 100 %.   PHYSICAL EXAMINATION:  GENERAL:  62 y.o.-year-old patient lying in the bed with no acute distress.  EYES: Pupils equal, round, reactive to light and accommodation. No scleral icterus. Extraocular muscles intact.  HEENT: Head atraumatic, normocephalic. Oropharynx and nasopharynx clear.  NECK:  Supple, no jugular venous distention. No thyroid enlargement, no tenderness.  LUNGS: Normal  breath sounds bilaterally, no wheezing, rales,rhonchi or crepitation. No use of accessory muscles of respiration.  CARDIOVASCULAR: S1, S2 normal. No murmurs, rubs, or gallops.  ABDOMEN: Soft, non-tender, non-distended. Bowel sounds present. No organomegaly or mass.  EXTREMITIES: No pedal edema, cyanosis, or clubbing.  NEUROLOGIC: Cranial nerves II through XII are intact. Muscle strength 5/5 in all extremities. Sensation intact. Gait not checked.  PSYCHIATRIC: The patient is alert and oriented x 3.  SKIN: No obvious rash, lesion, or ulcer.   DATA REVIEW:   CBC Recent Labs  Lab 12/14/17 0535  WBC 7.5  HGB 11.3*  HCT 33.9*  PLT 194    Chemistries  Recent Labs  Lab 12/14/17 0535  NA 139  K 3.5  CL 108  CO2 24  GLUCOSE 144*  BUN 16  CREATININE 0.57  CALCIUM 8.6*    Cardiac Enzymes Recent Labs  Lab 12/13/17 2200  TROPONINI <0.03    Microbiology Results  Results for orders placed or performed during the hospital encounter of 10/18/15  MRSA PCR Screening     Status: None   Collection Time: 10/18/15  4:50 PM  Result Value Ref Range Status   MRSA by PCR NEGATIVE NEGATIVE Final    Comment:        The GeneXpert MRSA Assay (FDA approved for NASAL specimens only), is one component of a comprehensive MRSA colonization surveillance program. It is not intended to diagnose MRSA infection nor to guide or monitor treatment for MRSA infections.     RADIOLOGY:  Dg Chest 2 View  Result Date: 12/13/2017 CLINICAL DATA:  Chest pain and shortness of breath. EXAM: CHEST - 2 VIEW COMPARISON:  November 01, 2017 FINDINGS: No pneumothorax. Suspected left retrocardiac opacity somewhat rounded and nodular in appearance on the lateral view. The heart, hila, mediastinum, lungs, and pleura are otherwise unremarkable. IMPRESSION: Somewhat rounded opacity on the lateral view is thought to be in the retrocardiac region. Infiltrate is favored over a nodule. Recommend a short-term follow-up to  ensure resolution. Electronically Signed   By: Dorise Bullion III M.D   On: 12/13/2017 22:54   Ct Chest Wo Contrast  Result Date: 12/15/2017 CLINICAL DATA:  Indeterminate focal opacity overlying the lower thoracic spine on recent lateral view chest radiograph. Acute respiratory illness. EXAM: CT CHEST WITHOUT CONTRAST TECHNIQUE: Multidetector CT imaging of the chest was performed following the standard protocol without IV contrast. COMPARISON:  12/13/2017 chest radiograph.  FINDINGS: Cardiovascular: Borderline mild cardiomegaly. No significant pericardial effusion/thickening. Three-vessel coronary atherosclerosis status post CABG. Atherosclerotic nonaneurysmal thoracic aorta. Normal caliber pulmonary arteries. Mediastinum/Nodes: No discrete thyroid nodules. Unremarkable esophagus. No pathologically enlarged axillary, mediastinal or hilar lymph nodes, noting limited sensitivity for the detection of hilar adenopathy on this noncontrast study. Lungs/Pleura: No pneumothorax. No pleural effusion. No acute consolidative airspace disease, lung masses or significant pulmonary nodules. There is osteophyte related fibrosis in the medial right lower lobe. No significant regions of bronchiectasis. Upper abdomen: Cholecystectomy. Musculoskeletal: No aggressive appearing focal osseous lesions. Intact sternotomy wires. Moderate thoracic spondylosis with bulky marginal osteophytes along the right lower thoracic spine. IMPRESSION: 1. No acute consolidative airspace disease to suggest a pneumonia. No lung masses or significant pulmonary nodules. 2. Bulky marginal osteophytes along the right lower thoracic spine with associated osteophyte related fibrosis in the medial right lower lung lobe, which probably account for the opacity described on the recent chest radiograph. 3. Borderline mild cardiomegaly. Aortic Atherosclerosis (ICD10-I70.0). Electronically Signed   By: Ilona Sorrel M.D.   On: 12/15/2017 11:44   Mr Thoracic Spine  Wo Contrast  Result Date: 12/14/2017 CLINICAL DATA:  Acute onset of severe chest pain, recent cardiac catheterization. Evaluate for spinal source. EXAM: MRI THORACIC SPINE WITHOUT CONTRAST TECHNIQUE: Multiplanar, multisequence MR imaging of the thoracic spine was performed. No intravenous contrast was administered. COMPARISON:  None. FINDINGS: Alignment:  Physiologic. Vertebrae: No fracture, evidence of discitis, or bone lesion. Cord:  Normal signal and morphology. Paraspinal and other soft tissues: Small BILATERAL pleural effusions. No paravertebral mass. Disc levels: No significant disc protrusion or spinal stenosis. IMPRESSION: Unremarkable thoracic spine MRI. Electronically Signed   By: Staci Righter M.D.   On: 12/14/2017 12:32     Management plans discussed with the patient, and is in agreement.  CODE STATUS:     Code Status Orders  (From admission, onward)        Start     Ordered   12/14/17 0144  Full code  Continuous     12/14/17 0143    Code Status History    Date Active Date Inactive Code Status Order ID Comments User Context   11/23/2017 1254 11/23/2017 1801 Full Code [id number]  Corey Skains, MD Inpatient   11/02/2017 0216 11/03/2017 1818 Full Code [id number]  Arta Silence, MD Inpatient   02/16/2017 1024 02/17/2017 1245 Full Code [id number]  Yolonda Kida, MD Inpatient      TOTAL TIME TAKING CARE OF THIS PATIENT: 35 minutes.    Loletha Grayer M.D on 12/15/2017 at 12:29 PM  Between 7am to 6pm - Pager - [phone]  After 6pm go to [email or web address] - password [email or web address] [facility]  [phone]  CC: Primary care physician; Lavera Guise, MD

## 2017-12-15 NOTE — Discharge Instructions (Signed)
Chest Wall Pain °Chest wall pain is pain in or around the bones and muscles of your chest. Sometimes, an injury causes this pain. Sometimes, the cause may not be known. This pain may take several weeks or longer to get better. °Follow these instructions at home: °Pay attention to any changes in your symptoms. Take these actions to help with your pain: °· Rest as told by your doctor. °· Avoid activities that cause pain. Try not to use your chest, belly (abdominal), or side muscles to lift heavy things. °· If directed, apply ice to the painful area: °? Put ice in a plastic bag. °? Place a towel between your skin and the bag. °? Leave the ice on for 20 minutes, 2-3 times per day. °· Take over-the-counter and prescription medicines only as told by your doctor. °· Do not use tobacco products, including cigarettes, chewing tobacco, and e-cigarettes. If you need help quitting, ask your doctor. °· Keep all follow-up visits as told by your doctor. This is important. ° °Contact a doctor if: °· You have a fever. °· Your chest pain gets worse. °· You have new symptoms. °Get help right away if: °· You feel sick to your stomach (nauseous) or you throw up (vomit). °· You feel sweaty or light-headed. °· You have a cough with phlegm (sputum) or you cough up blood. °· You are short of breath. °This information is not intended to replace advice given to you by your health care provider. Make sure you discuss any questions you have with your health care provider. °Document Released: 12/08/2007 Document Revised: 11/27/2015 Document Reviewed: 09/16/2014 °Elsevier Interactive Patient Education © 2018 [employer] ° °

## 2017-12-15 NOTE — Progress Notes (Signed)
Felicia Woods to be D/C'd Home per MD order.  Discussed prescriptions and follow up appointments with the patient. Prescriptions eprescribed, medication list explained in detail. Pt verbalized understanding.  Allergies as of 12/15/2017      Reactions   Penicillin G Anaphylaxis, Other (See Comments)   Has patient had a PCN reaction causing immediate rash, facial/tongue/throat swelling, SOB or lightheadedness with hypotension: [id number]} Has patient had a PCN reaction causing severe rash involving mucus membranes or skin necrosis: no:[id number]} Has patient had a PCN reaction that required hospitalization no:[id number]} Has patient had a PCN reaction occurring within the last 10 years: no:[id number]} If all of the above answers are "NO", then may proceed with Cephalosporin use.      Medication List    STOP taking these medications   atorvastatin 80 MG tablet Commonly known as:  LIPITOR   glimepiride 1 MG tablet Commonly known as:  AMARYL     TAKE these medications   albuterol 108 (90 Base) MCG/ACT inhaler Commonly known as:  PROVENTIL HFA;VENTOLIN HFA Inhale 2 puffs into the lungs every 6 (six) hours as needed for wheezing or shortness of breath.   amLODipine 10 MG tablet Commonly known as:  NORVASC Take 1 tablet (10 mg total) by mouth daily.   aspirin EC 81 MG tablet Take 81 mg by mouth daily.   bisoprolol-hydrochlorothiazide 2.5-6.25 MG tablet Commonly known as:  ZIAC Take 1 tablet by mouth daily.   budesonide-formoterol 160-4.5 MCG/ACT inhaler Commonly known as:  SYMBICORT Inhale 2 puffs into the lungs 2 (two) times daily.   clopidogrel 75 MG tablet Commonly known as:  PLAVIX Take 75 mg by mouth daily.   cyclobenzaprine 10 MG tablet Commonly known as:  FLEXERIL Take 10 mg by mouth 2 (two) times daily as needed for muscle spasms.   estradiol 0.5 MG tablet Commonly known as:  ESTRACE Take 1 tablet (0.5 mg total) by mouth daily.   furosemide 40 MG tablet Commonly  known as:  LASIX Take 40 mg by mouth 2 (two) times daily.   isosorbide mononitrate 30 MG 24 hr tablet Commonly known as:  IMDUR Take 1 tablet (30 mg total) by mouth daily.   losartan 50 MG tablet Commonly known as:  COZAAR Take 1 tablet (50 mg total) by mouth daily.   meclizine 25 MG tablet Commonly known as:  ANTIVERT Take 1 tablet (25 mg total) by mouth 3 (three) times daily as needed for dizziness.   metFORMIN 500 MG tablet Commonly known as:  GLUCOPHAGE Take by mouth 2 (two) times daily with a meal.   montelukast 10 MG tablet Commonly known as:  SINGULAIR Take 1 tablet (10 mg total) by mouth daily.   nitroGLYCERIN 0.4 MG SL tablet Commonly known as:  NITROSTAT Place 0.4 mg under the tongue every 5 (five) minutes x 3 doses as needed for chest pain. *If no relief call MD or go to Emergency Room*   omeprazole 40 MG capsule Commonly known as:  PRILOSEC Take 1 capsule (40 mg total) by mouth daily with supper.   oxybutynin 5 MG tablet Commonly known as:  DITROPAN Take 1 tablet (5 mg total) by mouth daily after supper.   predniSONE 10 MG tablet Commonly known as:  DELTASONE One tab po day for three days Start taking on:  12/16/2017   ranitidine 150 MG tablet Commonly known as:  ZANTAC Take 1 tablet (150 mg total) by mouth 2 (two) times daily.   sertraline 100 MG tablet Commonly known as:  ZOLOFT Take 100 mg by mouth daily.   simvastatin 10 MG tablet Commonly known as:  ZOCOR Take 10 mg by mouth daily.   traMADol 50 MG tablet Commonly known as:  ULTRAM Take 50 mg by mouth every 6 (six) hours as needed for moderate pain.   zolpidem 10 MG tablet Commonly known as:  AMBIEN Take 10 mg by mouth at bedtime.       Vitals:   12/15/17 0320 12/15/17 0759  BP: 127/63 (!) 149/89  Pulse: 71 76  Resp: 18 18  Temp: 98.4 F (36.9 C) 97.6 F (36.4 C)  SpO2: 100% 100%    Tele box removed and returned. Skin clean, dry and intact without evidence of skin break down,  no evidence of skin tears noted. IV catheter discontinued intact. Site without signs and symptoms of complications. Dressing and pressure applied. Pt denies pain at this time. No complaints noted.  An After Visit Summary was printed and given to the patient. Patient escorted via [place], and D/C home via private auto.  [name]

## 2017-12-21 ENCOUNTER — Ambulatory Visit: Payer: Self-pay | Admitting: Internal Medicine

## 2017-12-21 DIAGNOSIS — I2119 ST elevation (STEMI) myocardial infarction involving other coronary artery of inferior wall: Secondary | ICD-10-CM | POA: Diagnosis not present

## 2017-12-21 DIAGNOSIS — E782 Mixed hyperlipidemia: Secondary | ICD-10-CM | POA: Diagnosis not present

## 2017-12-21 DIAGNOSIS — R200 Anesthesia of skin: Secondary | ICD-10-CM | POA: Diagnosis not present

## 2017-12-21 DIAGNOSIS — I5032 Chronic diastolic (congestive) heart failure: Secondary | ICD-10-CM | POA: Diagnosis not present

## 2017-12-21 DIAGNOSIS — I25708 Atherosclerosis of coronary artery bypass graft(s), unspecified, with other forms of angina pectoris: Secondary | ICD-10-CM | POA: Diagnosis not present

## 2017-12-21 DIAGNOSIS — I10 Essential (primary) hypertension: Secondary | ICD-10-CM | POA: Diagnosis not present

## 2017-12-21 DIAGNOSIS — R202 Paresthesia of skin: Secondary | ICD-10-CM | POA: Diagnosis not present

## 2018-01-11 ENCOUNTER — Encounter (INDEPENDENT_AMBULATORY_CARE_PROVIDER_SITE_OTHER): Payer: Self-pay

## 2018-01-11 ENCOUNTER — Ambulatory Visit (INDEPENDENT_AMBULATORY_CARE_PROVIDER_SITE_OTHER): Payer: Medicare Other | Admitting: Internal Medicine

## 2018-01-11 ENCOUNTER — Encounter: Payer: Self-pay | Admitting: Internal Medicine

## 2018-01-11 VITALS — BP 147/89 | HR 78 | Resp 16 | Ht 67.0 in | Wt 253.8 lb

## 2018-01-11 DIAGNOSIS — G4709 Other insomnia: Secondary | ICD-10-CM | POA: Diagnosis not present

## 2018-01-11 DIAGNOSIS — E1165 Type 2 diabetes mellitus with hyperglycemia: Secondary | ICD-10-CM

## 2018-01-11 DIAGNOSIS — I119 Hypertensive heart disease without heart failure: Secondary | ICD-10-CM | POA: Diagnosis not present

## 2018-01-11 DIAGNOSIS — F324 Major depressive disorder, single episode, in partial remission: Secondary | ICD-10-CM

## 2018-01-11 DIAGNOSIS — N951 Menopausal and female climacteric states: Secondary | ICD-10-CM

## 2018-01-11 DIAGNOSIS — E782 Mixed hyperlipidemia: Secondary | ICD-10-CM | POA: Diagnosis not present

## 2018-01-11 LAB — POCT GLYCOSYLATED HEMOGLOBIN (HGB A1C): HEMOGLOBIN A1C: 6.7 % — AB (ref 4.0–5.6)

## 2018-01-11 MED ORDER — AMLODIPINE BESYLATE 10 MG PO TABS: 10.0000 mg | ORAL_TABLET | Freq: Every day | ORAL | 1 refills | Status: DC

## 2018-01-11 MED ORDER — ATORVASTATIN CALCIUM 20 MG PO TABS: 20.0000 mg | ORAL_TABLET | Freq: Every day | ORAL | 3 refills | Status: DC

## 2018-01-11 MED ORDER — ZOLPIDEM TARTRATE 10 MG PO TABS: 10.0000 mg | ORAL_TABLET | Freq: Every day | ORAL | 3 refills | Status: DC

## 2018-01-11 MED ORDER — RANITIDINE HCL 150 MG PO TABS: 150.0000 mg | ORAL_TABLET | Freq: Two times a day (BID) | ORAL | 5 refills | Status: DC

## 2018-01-11 MED ORDER — ESTRADIOL 0.5 MG PO TABS: 0.5000 mg | ORAL_TABLET | Freq: Every day | ORAL | 1 refills | Status: DC

## 2018-01-11 MED ORDER — MONTELUKAST SODIUM 10 MG PO TABS: 10.0000 mg | ORAL_TABLET | Freq: Every day | ORAL | 1 refills | Status: DC

## 2018-01-11 MED ORDER — CLOPIDOGREL BISULFATE 75 MG PO TABS: 75.0000 mg | ORAL_TABLET | Freq: Every day | ORAL | 3 refills | Status: DC

## 2018-01-11 MED ORDER — OXYBUTYNIN CHLORIDE 5 MG PO TABS: 5.0000 mg | ORAL_TABLET | Freq: Every day | ORAL | 1 refills | Status: DC

## 2018-01-11 MED ORDER — FUROSEMIDE 40 MG PO TABS: 40.0000 mg | ORAL_TABLET | Freq: Two times a day (BID) | ORAL | 3 refills | Status: DC

## 2018-01-11 MED ORDER — SERTRALINE HCL 100 MG PO TABS: 100.0000 mg | ORAL_TABLET | Freq: Every day | ORAL | 3 refills | Status: DC

## 2018-01-11 MED ORDER — LOSARTAN POTASSIUM 100 MG PO TABS: ORAL_TABLET | ORAL | 3 refills | Status: DC

## 2018-01-11 MED ORDER — BISOPROLOL-HYDROCHLOROTHIAZIDE 2.5-6.25 MG PO TABS: 1.0000 | ORAL_TABLET | Freq: Every day | ORAL | 5 refills | Status: DC

## 2018-01-11 NOTE — Progress Notes (Signed)
[facility] [place], [place] [address]  Internal MEDICINE  Office Visit Note  Patient Name: Felicia Woods  [id number]  [id number]  Date of Service: 01/19/2018  Chief Complaint  Patient presents with  . Medication Refill    HPI  Pt is here for routine follow up.Pt needs refills on her medications, has been non adherent to meds. Denies any complaints    Current Medication: Outpatient Encounter Medications as of 01/11/2018  Medication Sig  . albuterol (PROVENTIL HFA;VENTOLIN HFA) 108 (90 Base) MCG/ACT inhaler Inhale 2 puffs into the lungs every 6 (six) hours as needed for wheezing or shortness of breath.  Marland Kitchen amLODipine (NORVASC) 10 MG tablet Take 1 tablet (10 mg total) by mouth daily.  Marland Kitchen aspirin EC 81 MG tablet Take 81 mg by mouth daily.  . bisoprolol-hydrochlorothiazide (ZIAC) 2.5-6.25 MG tablet Take 1 tablet by mouth daily.  . budesonide-formoterol (SYMBICORT) 160-4.5 MCG/ACT inhaler Inhale 2 puffs into the lungs 2 (two) times daily.  Marland Kitchen estradiol (ESTRACE) 0.5 MG tablet Take 1 tablet (0.5 mg total) by mouth daily.  . furosemide (LASIX) 40 MG tablet Take 1 tablet (40 mg total) by mouth 2 (two) times daily.  Marland Kitchen losartan (COZAAR) 100 MG tablet Take one tab po qd  . montelukast (SINGULAIR) 10 MG tablet Take 1 tablet (10 mg total) by mouth daily.  . nitroGLYCERIN (NITROSTAT) 0.4 MG SL tablet Place 0.4 mg under the tongue every 5 (five) minutes x 3 doses as needed for chest pain. *If no relief call MD or go to Emergency Room*  . oxybutynin (DITROPAN) 5 MG tablet Take 1 tablet (5 mg total) by mouth daily after supper.  . ranitidine (ZANTAC) 150 MG tablet Take 1 tablet (150 mg total) by mouth 2 (two) times daily.  . sertraline (ZOLOFT) 100 MG tablet Take 1 tablet (100 mg total) by mouth daily.  Marland Kitchen zolpidem (AMBIEN) 10 MG tablet Take 1 tablet (10 mg total) by mouth at bedtime.  . [DISCONTINUED] amLODipine (NORVASC) 10 MG tablet Take 1 tablet (10 mg total) by mouth  daily.  . [DISCONTINUED] bisoprolol-hydrochlorothiazide (ZIAC) 2.5-6.25 MG tablet Take 1 tablet by mouth daily.  . [DISCONTINUED] cyclobenzaprine (FLEXERIL) 10 MG tablet Take 10 mg by mouth 2 (two) times daily as needed for muscle spasms.   . [DISCONTINUED] estradiol (ESTRACE) 0.5 MG tablet Take 1 tablet (0.5 mg total) by mouth daily.  . [DISCONTINUED] furosemide (LASIX) 40 MG tablet Take 40 mg by mouth 2 (two) times daily.   . [DISCONTINUED] glimepiride (AMARYL) 2 MG tablet Take 2 mg by mouth daily with breakfast.  . [DISCONTINUED] losartan (COZAAR) 50 MG tablet Take 1 tablet (50 mg total) by mouth daily.  . [DISCONTINUED] montelukast (SINGULAIR) 10 MG tablet Take 1 tablet (10 mg total) by mouth daily.  . [DISCONTINUED] omeprazole (PRILOSEC) 40 MG capsule Take 1 capsule (40 mg total) by mouth daily with supper.  . [DISCONTINUED] oxybutynin (DITROPAN) 5 MG tablet Take 1 tablet (5 mg total) by mouth daily after supper.  . [DISCONTINUED] ranitidine (ZANTAC) 150 MG tablet Take 1 tablet (150 mg total) by mouth 2 (two) times daily.  . [DISCONTINUED] sertraline (ZOLOFT) 100 MG tablet Take 100 mg by mouth daily.   . [DISCONTINUED] simvastatin (ZOCOR) 10 MG tablet Take 10 mg by mouth daily.  . [DISCONTINUED] zolpidem (AMBIEN) 10 MG tablet Take 10 mg by mouth at bedtime.  Marland Kitchen atorvastatin (LIPITOR) 20 MG tablet Take 1 tablet (20 mg total) by mouth daily.  . clopidogrel (  PLAVIX) 75 MG tablet Take 1 tablet (75 mg total) by mouth daily.  . [DISCONTINUED] clopidogrel (PLAVIX) 75 MG tablet Take 75 mg by mouth daily.  . [DISCONTINUED] isosorbide mononitrate (IMDUR) 30 MG 24 hr tablet Take 1 tablet (30 mg total) by mouth daily.  . [DISCONTINUED] meclizine (ANTIVERT) 25 MG tablet Take 1 tablet (25 mg total) by mouth 3 (three) times daily as needed for dizziness. (Patient not taking: Reported on 01/11/2018)  . [DISCONTINUED] metFORMIN (GLUCOPHAGE) 500 MG tablet Take by mouth 2 (two) times daily with a meal.  .  [DISCONTINUED] predniSONE (DELTASONE) 10 MG tablet One tab po day for three days (Patient not taking: Reported on 01/11/2018)  . [DISCONTINUED] traMADol (ULTRAM) 50 MG tablet Take 50 mg by mouth every 6 (six) hours as needed for moderate pain.   No facility-administered encounter medications on file as of 01/11/2018.     Surgical History: Past Surgical History:  Procedure Laterality Date  . ABDOMINAL HYSTERECTOMY    . CARDIAC CATHETERIZATION    . CARDIAC CATHETERIZATION N/A 10/18/2015   Procedure: Left Heart Cath and Cors/Grafts Angiography;  Surgeon: Lorretta Harp, MD;  Location: [place];  Service: Cardiovascular;  Laterality: N/A;  . CARDIAC CATHETERIZATION N/A 10/18/2015   Procedure: Coronary Stent Intervention;  Surgeon: Lorretta Harp, MD;  Location: [facility];  Service: Cardiovascular;  Laterality: N/A;  . CARDIAC SURGERY    . CHOLECYSTECTOMY    . CORONARY ANGIOPLASTY    . CORONARY ARTERY BYPASS GRAFT     4 vessels - 2010  . CORONARY STENT INTERVENTION N/A 02/16/2017   Procedure: CORONARY STENT INTERVENTION;  Surgeon: Yolonda Kida, MD;  Location: [place];  Service: Cardiovascular;  Laterality: N/A;  . LEFT HEART CATH AND CORONARY ANGIOGRAPHY Left 02/16/2017   Procedure: LEFT HEART CATH AND CORONARY ANGIOGRAPHY;  Surgeon: Corey Skains, MD;  Location: [facility];  Service: Cardiovascular;  Laterality: Left;  . LEFT HEART CATH AND CORS/GRAFTS ANGIOGRAPHY Left 11/23/2017   Procedure: LEFT HEART CATH AND CORS/GRAFTS ANGIOGRAPHY;  Surgeon: Corey Skains, MD;  Location: [facility];  Service: Cardiovascular;  Laterality: Left;    Medical History: Past Medical History:  Diagnosis Date  . Asthma   . Coronary artery disease   . Diabetes mellitus without complication ([place])   . Hyperlipidemia   . Hypertension   . MI (myocardial infarction) ([place])   . Migraine headache with aura   . 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     Family History: No family history on file.  Social History   Socioeconomic History  . Marital status: Single    Spouse name: Not on file  . Number of children: Not on file  . Years of education: Not on file  . Highest education level: Not on file  Occupational History  . Not on file  Social Needs  . Financial resource strain: Not on file  . Food insecurity:    Worry: Not on file    Inability: Not on file  . Transportation needs:    Medical: Not on file    Non-medical: Not on file  Tobacco Use  . Smoking status: Never Smoker  . Smokeless tobacco: Never Used  Substance and Sexual Activity  . Alcohol use: No    Alcohol/week: 0.0 oz  . Drug use: No  . Sexual activity: Yes  Lifestyle  . Physical activity:    Days per week: Not on file  Minutes per session: Not on file  . Stress: Not on file  Relationships  . Social connections:    Talks on phone: Not on file    Gets together: Not on file    Attends religious service: Not on file    Active member of club or organization: Not on file    Attends meetings of clubs or organizations: Not on file    Relationship status: Not on file  . Intimate partner violence:    Fear of current or ex partner: Not on file    Emotionally abused: Not on file    Physically abused: Not on file    Forced sexual activity: Not on file  Other Topics Concern  . Not on file  Social History Narrative  . Not on file   Review of Systems  Constitutional: Negative for chills, diaphoresis and fatigue.  HENT: Negative for ear pain, postnasal drip and sinus pressure.   Eyes: Negative for photophobia, discharge, redness, itching and visual disturbance.  Respiratory: Negative for cough, shortness of breath and wheezing.   Cardiovascular: Negative for chest pain, palpitations and leg swelling.  Gastrointestinal: Negative for abdominal pain, constipation, diarrhea, nausea and vomiting.  Genitourinary: Negative for dysuria and flank pain.   Musculoskeletal: Negative for arthralgias, back pain, gait problem and neck pain.  Skin: Negative for color change.  Allergic/Immunologic: Negative for environmental allergies and food allergies.  Neurological: Negative for dizziness and headaches.  Hematological: Does not bruise/bleed easily.  Psychiatric/Behavioral: Negative for agitation, behavioral problems (depression) and hallucinations.    Vital Signs: BP (!) 147/89 (BP Location: Right Arm, Patient Position: Sitting, Cuff Size: Large)   Pulse 78   Resp 16   Ht '5\' 7"'  (1.702 m)   Wt 253 lb 12.8 oz (115.1 kg)   BMI 39.75 kg/m    Physical Exam  Constitutional: She is oriented to person, place, and time. She appears well-developed and well-nourished. No distress.  HENT:  Head: Normocephalic and atraumatic.  Mouth/Throat: Oropharynx is clear and moist. No oropharyngeal exudate.  Eyes: Pupils are equal, round, and reactive to light. EOM are normal.  Neck: Normal range of motion. Neck supple. No JVD present. No tracheal deviation present. No thyromegaly present.  Cardiovascular: Normal rate, regular rhythm and normal heart sounds. Exam reveals no gallop and no friction rub.  No murmur heard. Pulmonary/Chest: Effort normal. No respiratory distress. She has no wheezes. She has no rales. She exhibits no tenderness.  Abdominal: Soft. Bowel sounds are normal.  Musculoskeletal: Normal range of motion.  Lymphadenopathy:    She has no cervical adenopathy.  Neurological: She is alert and oriented to person, place, and time. No cranial nerve deficit.  Skin: Skin is warm and dry. She is not diaphoretic.  Psychiatric: She has a normal mood and affect. Her behavior is normal. Judgment and thought content normal.    Assessment/Plan: 1. Uncontrolled type 2 diabetes mellitus with hyperglycemia (HCC) - DC all oral hypoglycemics, start Trulicity for now  - POCT HgB A1C  2. Hypertensive heart disease without heart failure - Continue to see  cardiology  3. Morbid obesity ([place]) - Encouraged weight loss   4. Mixed hyperlipidemia - Controlled with meds   5. Postmenopausal disorder - Decreased Estradiol   6. Depression, major, single episode, in partial remission ([place]) - Controlled   7. Other insomnia - Stable   General Counseling: Lotta verbalizes understanding of the findings of todays visit and agrees with plan of treatment. I have discussed any further diagnostic  evaluation that may be needed or ordered today. We also reviewed her medications today. she has been encouraged to call the office with any questions or concerns that should arise related to todays visit.   Orders Placed This Encounter  Procedures  . POCT HgB A1C    Meds ordered this encounter  Medications  . zolpidem (AMBIEN) 10 MG tablet    Sig: Take 1 tablet (10 mg total) by mouth at bedtime.    Dispense:  30 tablet    Refill:  3  . atorvastatin (LIPITOR) 20 MG tablet    Sig: Take 1 tablet (20 mg total) by mouth daily.    Dispense:  90 tablet    Refill:  3  . sertraline (ZOLOFT) 100 MG tablet    Sig: Take 1 tablet (100 mg total) by mouth daily.    Dispense:  90 tablet    Refill:  3  . oxybutynin (DITROPAN) 5 MG tablet    Sig: Take 1 tablet (5 mg total) by mouth daily after supper.    Dispense:  90 tablet    Refill:  1  . estradiol (ESTRACE) 0.5 MG tablet    Sig: Take 1 tablet (0.5 mg total) by mouth daily.    Dispense:  90 tablet    Refill:  1  . montelukast (SINGULAIR) 10 MG tablet    Sig: Take 1 tablet (10 mg total) by mouth daily.    Dispense:  90 tablet    Refill:  1  . ranitidine (ZANTAC) 150 MG tablet    Sig: Take 1 tablet (150 mg total) by mouth 2 (two) times daily.    Dispense:  60 tablet    Refill:  5  . furosemide (LASIX) 40 MG tablet    Sig: Take 1 tablet (40 mg total) by mouth 2 (two) times daily.    Dispense:  60 tablet    Refill:  3  . losartan (COZAAR) 100 MG tablet    Sig: Take one tab po qd    Dispense:  90 tablet     Refill:  3  . bisoprolol-hydrochlorothiazide (ZIAC) 2.5-6.25 MG tablet    Sig: Take 1 tablet by mouth daily.    Dispense:  30 tablet    Refill:  5  . amLODipine (NORVASC) 10 MG tablet    Sig: Take 1 tablet (10 mg total) by mouth daily.    Dispense:  90 tablet    Refill:  1  . clopidogrel (PLAVIX) 75 MG tablet    Sig: Take 1 tablet (75 mg total) by mouth daily.    Dispense:  90 tablet    Refill:  3    Time spent: 25 Minutes   Dr Lavera Guise Internal medicine

## 2018-01-16 ENCOUNTER — Other Ambulatory Visit: Payer: Self-pay

## 2018-01-16 MED ORDER — ACCU-CHEK SOFTCLIX LANCETS MISC: 3 refills | Status: DC

## 2018-01-16 MED ORDER — GLUCOSE BLOOD VI STRP: 1.0000 | ORAL_STRIP | 3 refills | Status: DC | PRN

## 2018-01-18 DIAGNOSIS — M898X9 Other specified disorders of bone, unspecified site: Secondary | ICD-10-CM | POA: Diagnosis not present

## 2018-01-18 DIAGNOSIS — E1140 Type 2 diabetes mellitus with diabetic neuropathy, unspecified: Secondary | ICD-10-CM | POA: Diagnosis not present

## 2018-01-18 DIAGNOSIS — L600 Ingrowing nail: Secondary | ICD-10-CM | POA: Diagnosis not present

## 2018-01-18 DIAGNOSIS — B351 Tinea unguium: Secondary | ICD-10-CM | POA: Diagnosis not present

## 2018-01-19 ENCOUNTER — Telehealth: Payer: Self-pay | Admitting: Internal Medicine

## 2018-01-19 NOTE — Telephone Encounter (Signed)
Called patient to check and see how she was doing on her medicaton change patient states that she was doing well on Trulicity and before I could ask anything else we got disconnected , attempted to try to call patient back and there was no ringing just silence, will attempt to call pt back

## 2018-02-14 ENCOUNTER — Ambulatory Visit: Payer: Self-pay | Admitting: Adult Health

## 2018-02-15 ENCOUNTER — Encounter: Payer: Self-pay | Admitting: Adult Health

## 2018-02-15 ENCOUNTER — Ambulatory Visit (INDEPENDENT_AMBULATORY_CARE_PROVIDER_SITE_OTHER): Payer: Medicare Other | Admitting: Adult Health

## 2018-02-15 VITALS — BP 148/96 | HR 64 | Resp 16 | Ht 67.0 in | Wt 252.0 lb

## 2018-02-15 DIAGNOSIS — M545 Low back pain, unspecified: Secondary | ICD-10-CM

## 2018-02-15 DIAGNOSIS — E1165 Type 2 diabetes mellitus with hyperglycemia: Secondary | ICD-10-CM | POA: Diagnosis not present

## 2018-02-15 LAB — GLUCOSE, POCT (MANUAL RESULT ENTRY): POC Glucose: 87 mg/dl (ref 70–99)

## 2018-02-15 MED ORDER — DULAGLUTIDE 0.75 MG/0.5ML ~~LOC~~ SOAJ: 0.7500 mg | SUBCUTANEOUS | 2 refills | Status: DC

## 2018-02-15 MED ORDER — CYCLOBENZAPRINE HCL 10 MG PO TABS: 10.0000 mg | ORAL_TABLET | Freq: Three times a day (TID) | ORAL | 0 refills | Status: DC | PRN

## 2018-02-15 NOTE — Progress Notes (Signed)
[facility] [place], [place] [address]  Internal MEDICINE  Office Visit Note  Patient Name: Felicia Woods  [id number]  [id number]  Date of Service: 03/03/2018  Chief Complaint  Patient presents with  . Diabetes    87 glucose reading   . Hyperlipidemia  . Depression    HPI Pt here for follow up.  At last visit she was given samples of Trulicity.  She has been using the Trulicity, and denies side effects.  She is also complaining of lower back pain, that has been present for approximately 2-3 months.  She would like a referral to see a specialist at this time. She has seen Dr. Nehemiah Massed because she thought these pains were from her heart, he believes they are not cardiac pains and suggested an evaluation.   Current Medication: Outpatient Encounter Medications as of 02/15/2018  Medication Sig  . ACCU-CHEK SOFTCLIX LANCETS lancets Use as instructed once  DAILY DIAG -E11.9  . albuterol (PROVENTIL HFA;VENTOLIN HFA) 108 (90 Base) MCG/ACT inhaler Inhale 2 puffs into the lungs every 6 (six) hours as needed for wheezing or shortness of breath.  Marland Kitchen amLODipine (NORVASC) 10 MG tablet Take 1 tablet (10 mg total) by mouth daily.  Marland Kitchen aspirin EC 81 MG tablet Take 81 mg by mouth daily.  Marland Kitchen atorvastatin (LIPITOR) 20 MG tablet Take 1 tablet (20 mg total) by mouth daily.  . bisoprolol-hydrochlorothiazide (ZIAC) 2.5-6.25 MG tablet Take 1 tablet by mouth daily.  . budesonide-formoterol (SYMBICORT) 160-4.5 MCG/ACT inhaler Inhale 2 puffs into the lungs 2 (two) times daily.  . clopidogrel (PLAVIX) 75 MG tablet Take 1 tablet (75 mg total) by mouth daily.  Marland Kitchen estradiol (ESTRACE) 0.5 MG tablet Take 1 tablet (0.5 mg total) by mouth daily.  . furosemide (LASIX) 40 MG tablet Take 1 tablet (40 mg total) by mouth 2 (two) times daily.  Marland Kitchen glucose blood (ACCU-CHEK AVIVA PLUS) test strip 1 each by Other route as needed for other. Use as instructed once  DAILY DIAG -E11.9  . losartan (COZAAR) 100 MG  tablet Take one tab po qd  . montelukast (SINGULAIR) 10 MG tablet Take 1 tablet (10 mg total) by mouth daily.  . nitroGLYCERIN (NITROSTAT) 0.4 MG SL tablet Place 0.4 mg under the tongue every 5 (five) minutes x 3 doses as needed for chest pain. *If no relief call MD or go to Emergency Room*  . oxybutynin (DITROPAN) 5 MG tablet Take 1 tablet (5 mg total) by mouth daily after supper.  . ranitidine (ZANTAC) 150 MG tablet Take 1 tablet (150 mg total) by mouth 2 (two) times daily.  . sertraline (ZOLOFT) 100 MG tablet Take 1 tablet (100 mg total) by mouth daily.  Marland Kitchen zolpidem (AMBIEN) 10 MG tablet Take 1 tablet (10 mg total) by mouth at bedtime.  . cyclobenzaprine (FLEXERIL) 10 MG tablet Take 1 tablet (10 mg total) by mouth 3 (three) times daily as needed for muscle spasms.  . Dulaglutide (TRULICITY) 7.94 IA/1.6PV SOPN Inject 0.75 mg into the skin once a week.   No facility-administered encounter medications on file as of 02/15/2018.     Surgical History: Past Surgical History:  Procedure Laterality Date  . ABDOMINAL HYSTERECTOMY    . CARDIAC CATHETERIZATION    . CARDIAC CATHETERIZATION N/A 10/18/2015   Procedure: Left Heart Cath and Cors/Grafts Angiography;  Surgeon: Lorretta Harp, MD;  Location: [facility];  Service: Cardiovascular;  Laterality: N/A;  . CARDIAC CATHETERIZATION N/A 10/18/2015   Procedure:  Coronary Stent Intervention;  Surgeon: Lorretta Harp, MD;  Location: [facility];  Service: Cardiovascular;  Laterality: N/A;  . CARDIAC SURGERY    . CHOLECYSTECTOMY    . CORONARY ANGIOPLASTY    . CORONARY ARTERY BYPASS GRAFT     4 vessels - 2010  . CORONARY STENT INTERVENTION N/A 02/16/2017   Procedure: CORONARY STENT INTERVENTION;  Surgeon: Yolonda Kida, MD;  Location: [facility];  Service: Cardiovascular;  Laterality: N/A;  . LEFT HEART CATH AND CORONARY ANGIOGRAPHY Left 02/16/2017   Procedure: LEFT HEART CATH AND CORONARY ANGIOGRAPHY;  Surgeon: Corey Skains, MD;  Location: [facility];  Service: Cardiovascular;  Laterality: Left;  . LEFT HEART CATH AND CORS/GRAFTS ANGIOGRAPHY Left 11/23/2017   Procedure: LEFT HEART CATH AND CORS/GRAFTS ANGIOGRAPHY;  Surgeon: Corey Skains, MD;  Location: [facility];  Service: Cardiovascular;  Laterality: Left;    Medical History: Past Medical History:  Diagnosis Date  . Asthma   . Coronary artery disease   . Diabetes mellitus without complication ([place])   . Hyperlipidemia   . Hypertension   . MI (myocardial infarction) ([place])   . Migraine headache with aura   . 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    Family History: History reviewed. No pertinent family history.  Social History   Socioeconomic History  . Marital status: Single    Spouse name: Not on file  . Number of children: Not on file  . Years of education: Not on file  . Highest education level: Not on file  Occupational History  . Not on file  Social Needs  . Financial resource strain: Not on file  . Food insecurity:    Worry: Not on file    Inability: Not on file  . Transportation needs:    Medical: Not on file    Non-medical: Not on file  Tobacco Use  . Smoking status: Never Smoker  . Smokeless tobacco: Never Used  Substance and Sexual Activity  . Alcohol use: No    Alcohol/week: 0.0 standard drinks  . Drug use: No  . Sexual activity: Yes  Lifestyle  . Physical activity:    Days per week: Not on file    Minutes per session: Not on file  . Stress: Not on file  Relationships  . Social connections:    Talks on phone: Not on file    Gets together: Not on file    Attends religious service: Not on file    Active member of club or organization: Not on file    Attends meetings of clubs or organizations: Not on file    Relationship status: Not on file  . Intimate partner violence:    Fear of current or ex partner: Not on file    Emotionally abused: Not on file    Physically abused: Not on file     Forced sexual activity: Not on file  Other Topics Concern  . Not on file  Social History Narrative  . Not on file      Review of Systems  Constitutional: Negative for chills, fatigue and unexpected weight change.  HENT: Negative for congestion, rhinorrhea, sneezing and sore throat.   Eyes: Negative for photophobia, pain and redness.  Respiratory: Negative for cough, chest tightness and shortness of breath.   Cardiovascular: Negative for chest pain and palpitations.  Gastrointestinal: Negative for abdominal pain, constipation, diarrhea, nausea and vomiting.  Endocrine: Negative.   Genitourinary: Negative  for dysuria and frequency.  Musculoskeletal: Positive for back pain. Negative for arthralgias, joint swelling and neck pain.       Pt reports low back pain bilaterally that radiates up into her left scapular region.   Skin: Negative for rash.  Allergic/Immunologic: Negative.   Neurological: Negative for tremors and numbness.  Hematological: Negative for adenopathy. Does not bruise/bleed easily.  Psychiatric/Behavioral: Negative for behavioral problems and sleep disturbance. The patient is not nervous/anxious.     Vital Signs: BP (!) 148/96   Pulse 64   Resp 16   Ht '5\' 7"'  (1.702 m)   Wt 252 lb (114.3 kg)   SpO2 99%   BMI 39.47 kg/m    Physical Exam  Constitutional: She is oriented to person, place, and time. She appears well-developed and well-nourished. No distress.  HENT:  Head: Normocephalic and atraumatic.  Mouth/Throat: Oropharynx is clear and moist. No oropharyngeal exudate.  Eyes: Pupils are equal, round, and reactive to light. EOM are normal.  Neck: Normal range of motion. Neck supple. No JVD present. No tracheal deviation present. No thyromegaly present.  Cardiovascular: Normal rate, regular rhythm and normal heart sounds. Exam reveals no gallop and no friction rub.  No murmur heard. Pulmonary/Chest: Effort normal and breath sounds normal. No respiratory  distress. She has no wheezes. She has no rales. She exhibits no tenderness.  Abdominal: Soft. There is no tenderness. There is no guarding.  Musculoskeletal: Normal range of motion.  Lymphadenopathy:    She has no cervical adenopathy.  Neurological: She is alert and oriented to person, place, and time. No cranial nerve deficit.  Skin: Skin is warm and dry. She is not diaphoretic.  Psychiatric: She has a normal mood and affect. Her behavior is normal. Judgment and thought content normal.  Nursing note and vitals reviewed.  Assessment/Plan: 1. Uncontrolled type 2 diabetes mellitus with hyperglycemia ([place]) Continue Trulicity as discussed.  - POCT glucose (manual entry) - Dulaglutide (TRULICITY) 2.77 AJ/2.8NO SOPN; Inject 0.75 mg into the skin once a week.  Dispense: 4 pen; Refill: 2  2. Acute bilateral low back pain without sciatica -Referral to Ortho.  At patient request. - cyclobenzaprine (FLEXERIL) 10 MG tablet; Take 1 tablet (10 mg total) by mouth 3 (three) times daily as needed for muscle spasms.  Dispense: 30 tablet; Refill: 0  3. Morbid obesity (HCC) Obesity Counseling: Risk Assessment: An assessment of behavioral risk factors was made today and includes lack of exercise sedentary lifestyle, lack of portion control and poor dietary habits.  Risk Modification Advice: She was counseled on portion control guidelines. Restricting daily caloric intake to. . The detrimental long term effects of obesity on her health and ongoing poor compliance was also discussed with the patient.    General Counseling: Bryan verbalizes understanding of the findings of todays visit and agrees with plan of treatment. I have discussed any further diagnostic evaluation that may be needed or ordered today. We also reviewed her medications today. she has been encouraged to call the office with any questions or concerns that should arise related to todays visit.    Orders Placed This Encounter  Procedures   . Ambulatory referral to Orthopedic Surgery  . POCT glucose (manual entry)    Meds ordered this encounter  Medications  . cyclobenzaprine (FLEXERIL) 10 MG tablet    Sig: Take 1 tablet (10 mg total) by mouth 3 (three) times daily as needed for muscle spasms.    Dispense:  30 tablet    Refill:  0  . Dulaglutide (TRULICITY) 1.54 SY/4.5BN SOPN    Sig: Inject 0.75 mg into the skin once a week.    Dispense:  4 pen    Refill:  2    Time spent: 25 Minutes   This patient was seen by Orson Gear AGNP-C in Collaboration with Dr Lavera Guise as a part of collaborative care agreement    Dr Lavera Guise Internal medicine

## 2018-02-15 NOTE — Patient Instructions (Signed)
Cyclobenzaprine tablets What is this medicine? CYCLOBENZAPRINE (sye kloe BEN za preen) is a muscle relaxer. It is used to treat muscle pain, spasms, and stiffness. This medicine may be used for other purposes; ask your health care provider or pharmacist if you have questions. COMMON BRAND NAME(S): Fexmid, Flexeril What should I tell my health care provider before I take this medicine? They need to know if you have any of these conditions: -heart disease, irregular heartbeat, or previous heart attack -liver disease -thyroid problem -an unusual or allergic reaction to cyclobenzaprine, tricyclic antidepressants, lactose, other medicines, foods, dyes, or preservatives -pregnant or trying to get pregnant -breast-feeding How should I use this medicine? Take this medicine by mouth with a glass of water. Follow the directions on the prescription label. If this medicine upsets your stomach, take it with food or milk. Take your medicine at regular intervals. Do not take it more often than directed. Talk to your pediatrician regarding the use of this medicine in children. Special care may be needed. Overdosage: If you think you have taken too much of this medicine contact a poison control center or emergency room at once. NOTE: This medicine is only for you. Do not share this medicine with others. What if I miss a dose? If you miss a dose, take it as soon as you can. If it is almost time for your next dose, take only that dose. Do not take double or extra doses. What may interact with this medicine? Do not take this medicine with any of the following medications: -certain medicines for fungal infections like fluconazole, itraconazole, ketoconazole, posaconazole, voriconazole -cisapride -dofetilide -dronedarone -halofantrine -levomethadyl -MAOIs like Carbex, Eldepryl, Marplan, Nardil, and Parnate -narcotic medicines for cough -pimozide -thioridazine -ziprasidone This medicine may also interact  with the following medications: -alcohol -antihistamines for allergy, cough and cold -certain medicines for anxiety or sleep -certain medicines for cancer -certain medicines for depression like amitriptyline, fluoxetine, sertraline -certain medicines for infection like alfuzosin, chloroquine, clarithromycin, levofloxacin, mefloquine, pentamidine, troleandomycin -certain medicines for irregular heart beat -certain medicines for seizures like phenobarbital, primidone -contrast dyes -general anesthetics like halothane, isoflurane, methoxyflurane, propofol -local anesthetics like lidocaine, pramoxine, tetracaine -medicines that relax muscles for surgery -narcotic medicines for pain -other medicines that prolong the QT interval (cause an abnormal heart rhythm) -phenothiazines like chlorpromazine, mesoridazine, prochlorperazine This list may not describe all possible interactions. Give your health care provider a list of all the medicines, herbs, non-prescription drugs, or dietary supplements you use. Also tell them if you smoke, drink alcohol, or use illegal drugs. Some items may interact with your medicine. What should I watch for while using this medicine? Tell your doctor or health care professional if your symptoms do not start to get better or if they get worse. You may get drowsy or dizzy. Do not drive, use machinery, or do anything that needs mental alertness until you know how this medicine affects you. Do not stand or sit up quickly, especially if you are an older patient. This reduces the risk of dizzy or fainting spells. Alcohol may interfere with the effect of this medicine. Avoid alcoholic drinks. If you are taking another medicine that also causes drowsiness, you may have more side effects. Give your health care provider a list of all medicines you use. Your doctor will tell you how much medicine to take. Do not take more medicine than directed. Call emergency for help if you have  problems breathing or unusual sleepiness. Your mouth may get dry. Chewing   sugarless gum or sucking hard candy, and drinking plenty of water may help. Contact your doctor if the problem does not go away or is severe. What side effects may I notice from receiving this medicine? Side effects that you should report to your doctor or health care professional as soon as possible: -allergic reactions like skin rash, itching or hives, swelling of the face, lips, or tongue -breathing problems -chest pain -fast, irregular heartbeat -hallucinations -seizures -unusually weak or tired Side effects that usually do not require medical attention (report to your doctor or health care professional if they continue or are bothersome): -headache -nausea, vomiting This list may not describe all possible side effects. Call your doctor for medical advice about side effects. You may report side effects to FDA at 1-800-FDA-1088. Where should I keep my medicine? Keep out of the reach of children. Store at room temperature between 15 and 30 degrees C (59 and 86 degrees F). Keep container tightly closed. Throw away any unused medicine after the expiration date. NOTE: This sheet is a summary. It may not cover all possible information. If you have questions about this medicine, talk to your doctor, pharmacist, or health care provider.  2018 Elsevier/Gold Standard (2015-04-01 12:05:46) Back Pain, Adult Back pain is very common. The pain often gets better over time. The cause of back pain is usually not dangerous. Most people can learn to manage their back pain on their own. Follow these instructions at home: Watch your back pain for any changes. The following actions may help to lessen any pain you are feeling:  Stay active. Start with short walks on flat ground if you can. Try to walk farther each day.  Exercise regularly as told by your doctor. Exercise helps your back heal faster. It also helps avoid future injury  by keeping your muscles strong and flexible.  Do not sit, drive, or stand in one place for more than 30 minutes.  Do not stay in bed. Resting more than 1-2 days can slow down your recovery.  Be careful when you bend or lift an object. Use good form when lifting: ? Bend at your knees. ? Keep the object close to your body. ? Do not twist.  Sleep on a firm mattress. Lie on your side, and bend your knees. If you lie on your back, put a pillow under your knees.  Take medicines only as told by your doctor.  Put ice on the injured area. ? Put ice in a plastic bag. ? Place a towel between your skin and the bag. ? Leave the ice on for 20 minutes, 2-3 times a day for the first 2-3 days. After that, you can switch between ice and heat packs.  Avoid feeling anxious or stressed. Find good ways to deal with stress, such as exercise.  Maintain a healthy weight. Extra weight puts stress on your back.  Contact a doctor if:  You have pain that does not go away with rest or medicine.  You have worsening pain that goes down into your legs or buttocks.  You have pain that does not get better in one week.  You have pain at night.  You lose weight.  You have a fever or chills. Get help right away if:  You cannot control when you poop (bowel movement) or pee (urinate).  Your arms or legs feel weak.  Your arms or legs lose feeling (numbness).  You feel sick to your stomach (nauseous) or throw up (vomit).  You  have belly (abdominal) pain.  You feel like you may pass out (faint). This information is not intended to replace advice given to you by your health care provider. Make sure you discuss any questions you have with your health care provider. Document Released: 12/08/2007 Document Revised: 11/27/2015 Document Reviewed: 10/23/2013 Elsevier Interactive Patient Education  [employer].

## 2018-02-17 DIAGNOSIS — M5416 Radiculopathy, lumbar region: Secondary | ICD-10-CM | POA: Diagnosis not present

## 2018-02-17 DIAGNOSIS — M545 Low back pain: Secondary | ICD-10-CM | POA: Diagnosis not present

## 2018-03-01 DIAGNOSIS — M545 Low back pain: Secondary | ICD-10-CM | POA: Diagnosis not present

## 2018-03-01 DIAGNOSIS — R2689 Other abnormalities of gait and mobility: Secondary | ICD-10-CM | POA: Diagnosis not present

## 2018-03-13 DIAGNOSIS — R2689 Other abnormalities of gait and mobility: Secondary | ICD-10-CM | POA: Diagnosis not present

## 2018-03-13 DIAGNOSIS — M545 Low back pain: Secondary | ICD-10-CM | POA: Diagnosis not present

## 2018-03-20 DIAGNOSIS — M545 Low back pain: Secondary | ICD-10-CM | POA: Diagnosis not present

## 2018-03-20 DIAGNOSIS — R2689 Other abnormalities of gait and mobility: Secondary | ICD-10-CM | POA: Diagnosis not present

## 2018-03-22 DIAGNOSIS — R2689 Other abnormalities of gait and mobility: Secondary | ICD-10-CM | POA: Diagnosis not present

## 2018-03-22 DIAGNOSIS — M545 Low back pain: Secondary | ICD-10-CM | POA: Diagnosis not present

## 2018-03-27 DIAGNOSIS — M545 Low back pain: Secondary | ICD-10-CM | POA: Diagnosis not present

## 2018-03-27 DIAGNOSIS — R2689 Other abnormalities of gait and mobility: Secondary | ICD-10-CM | POA: Diagnosis not present

## 2018-03-29 ENCOUNTER — Ambulatory Visit: Payer: Self-pay | Admitting: Adult Health

## 2018-03-30 DIAGNOSIS — M1711 Unilateral primary osteoarthritis, right knee: Secondary | ICD-10-CM | POA: Diagnosis not present

## 2018-03-30 DIAGNOSIS — M1712 Unilateral primary osteoarthritis, left knee: Secondary | ICD-10-CM | POA: Diagnosis not present

## 2018-03-30 DIAGNOSIS — M170 Bilateral primary osteoarthritis of knee: Secondary | ICD-10-CM | POA: Diagnosis not present

## 2018-04-03 ENCOUNTER — Ambulatory Visit (INDEPENDENT_AMBULATORY_CARE_PROVIDER_SITE_OTHER): Payer: Medicare Other | Admitting: Adult Health

## 2018-04-03 ENCOUNTER — Encounter: Payer: Self-pay | Admitting: Adult Health

## 2018-04-03 VITALS — BP 173/98 | HR 71 | Resp 16 | Ht 67.0 in | Wt 262.4 lb

## 2018-04-03 DIAGNOSIS — E669 Obesity, unspecified: Secondary | ICD-10-CM | POA: Diagnosis not present

## 2018-04-03 DIAGNOSIS — E782 Mixed hyperlipidemia: Secondary | ICD-10-CM

## 2018-04-03 DIAGNOSIS — F324 Major depressive disorder, single episode, in partial remission: Secondary | ICD-10-CM | POA: Diagnosis not present

## 2018-04-03 DIAGNOSIS — E1165 Type 2 diabetes mellitus with hyperglycemia: Secondary | ICD-10-CM | POA: Diagnosis not present

## 2018-04-03 DIAGNOSIS — I10 Essential (primary) hypertension: Secondary | ICD-10-CM

## 2018-04-03 DIAGNOSIS — M1711 Unilateral primary osteoarthritis, right knee: Secondary | ICD-10-CM

## 2018-04-03 DIAGNOSIS — M545 Low back pain, unspecified: Secondary | ICD-10-CM

## 2018-04-03 NOTE — Progress Notes (Signed)
[facility] [place], [place] [address]  Internal MEDICINE  Office Visit Note  Patient Name: Felicia Woods  [id number]  [id number]  Date of Service: 04/03/2018  Chief Complaint  Patient presents with  . Diabetes    6wk follow up  . Hyperlipidemia  . Depression    HPI Pt here for follow up on DM, HTN,HLD, depression, and low back pain.  She reports she is currently having Physical therapy for her back that is prescribed by ortho. She has one more session on Friday, and will have an MRI soon after. Today she is in pain.  She reports left foot pain, on plantar surface.  She can not bear weight it is so painful.  Pt has appt with podiatry tomorrow. She state it has been hurting for 2 weeks, and because of this she has not checked her blood sugar.  Her depression seems to be under control at this time.     Current Medication: Outpatient Encounter Medications as of 04/03/2018  Medication Sig  . ACCU-CHEK SOFTCLIX LANCETS lancets Use as instructed once  DAILY DIAG -E11.9  . albuterol (PROVENTIL HFA;VENTOLIN HFA) 108 (90 Base) MCG/ACT inhaler Inhale 2 puffs into the lungs every 6 (six) hours as needed for wheezing or shortness of breath.  Marland Kitchen amLODipine (NORVASC) 10 MG tablet Take 1 tablet (10 mg total) by mouth daily.  Marland Kitchen aspirin EC 81 MG tablet Take 81 mg by mouth daily.  Marland Kitchen atorvastatin (LIPITOR) 20 MG tablet Take 1 tablet (20 mg total) by mouth daily.  . bisoprolol-hydrochlorothiazide (ZIAC) 2.5-6.25 MG tablet Take 1 tablet by mouth daily.  . budesonide-formoterol (SYMBICORT) 160-4.5 MCG/ACT inhaler Inhale 2 puffs into the lungs 2 (two) times daily.  . clopidogrel (PLAVIX) 75 MG tablet Take 1 tablet (75 mg total) by mouth daily.  . cyclobenzaprine (FLEXERIL) 10 MG tablet Take 1 tablet (10 mg total) by mouth 3 (three) times daily as needed for muscle spasms.  . Dulaglutide (TRULICITY) 3.01 SW/1.0XN SOPN Inject 0.75 mg into the skin once a week.  . estradiol  (ESTRACE) 0.5 MG tablet Take 1 tablet (0.5 mg total) by mouth daily.  . furosemide (LASIX) 40 MG tablet Take 1 tablet (40 mg total) by mouth 2 (two) times daily.  Marland Kitchen glucose blood (ACCU-CHEK AVIVA PLUS) test strip 1 each by Other route as needed for other. Use as instructed once  DAILY DIAG -E11.9  . losartan (COZAAR) 100 MG tablet Take one tab po qd  . montelukast (SINGULAIR) 10 MG tablet Take 1 tablet (10 mg total) by mouth daily.  . nitroGLYCERIN (NITROSTAT) 0.4 MG SL tablet Place 0.4 mg under the tongue every 5 (five) minutes x 3 doses as needed for chest pain. *If no relief call MD or go to Emergency Room*  . oxybutynin (DITROPAN) 5 MG tablet Take 1 tablet (5 mg total) by mouth daily after supper.  . ranitidine (ZANTAC) 150 MG tablet Take 1 tablet (150 mg total) by mouth 2 (two) times daily.  . sertraline (ZOLOFT) 100 MG tablet Take 1 tablet (100 mg total) by mouth daily.  Marland Kitchen zolpidem (AMBIEN) 10 MG tablet Take 1 tablet (10 mg total) by mouth at bedtime.   No facility-administered encounter medications on file as of 04/03/2018.     Surgical History: Past Surgical History:  Procedure Laterality Date  . ABDOMINAL HYSTERECTOMY    . CARDIAC CATHETERIZATION    . CARDIAC CATHETERIZATION N/A 10/18/2015   Procedure: Left Heart Cath and Cors/Grafts Angiography;  Surgeon: Lorretta Harp, MD;  Location: [facility];  Service: Cardiovascular;  Laterality: N/A;  . CARDIAC CATHETERIZATION N/A 10/18/2015   Procedure: Coronary Stent Intervention;  Surgeon: Lorretta Harp, MD;  Location: [facility];  Service: Cardiovascular;  Laterality: N/A;  . CARDIAC SURGERY    . CHOLECYSTECTOMY    . CORONARY ANGIOPLASTY    . CORONARY ARTERY BYPASS GRAFT     4 vessels - 2010  . CORONARY STENT INTERVENTION N/A 02/16/2017   Procedure: CORONARY STENT INTERVENTION;  Surgeon: Yolonda Kida, MD;  Location: [facility];  Service: Cardiovascular;  Laterality: N/A;  . LEFT HEART CATH AND  CORONARY ANGIOGRAPHY Left 02/16/2017   Procedure: LEFT HEART CATH AND CORONARY ANGIOGRAPHY;  Surgeon: Corey Skains, MD;  Location: [facility] [place] [facility];  Service: Cardiovascular;  Laterality: Left;  . LEFT HEART CATH AND CORS/GRAFTS ANGIOGRAPHY Left 11/23/2017   Procedure: LEFT HEART CATH AND CORS/GRAFTS ANGIOGRAPHY;  Surgeon: Corey Skains, MD;  Location: [facility];  Service: Cardiovascular;  Laterality: Left;    Medical History: Past Medical History:  Diagnosis Date  . Asthma   . Coronary artery disease   . Diabetes mellitus without complication ([place])   . Hyperlipidemia   . Hypertension   . MI (myocardial infarction) ([place])   . Migraine headache with aura   . 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    Family History: History reviewed. No pertinent family history.  Social History   Socioeconomic History  . Marital status: Single    Spouse name: Not on file  . Number of children: Not on file  . Years of education: Not on file  . Highest education level: Not on file  Occupational History  . Not on file  Social Needs  . Financial resource strain: Not on file  . Food insecurity:    Worry: Not on file    Inability: Not on file  . Transportation needs:    Medical: Not on file    Non-medical: Not on file  Tobacco Use  . Smoking status: Never Smoker  . Smokeless tobacco: Never Used  Substance and Sexual Activity  . Alcohol use: No    Alcohol/week: 0.0 standard drinks  . Drug use: No  . Sexual activity: Yes  Lifestyle  . Physical activity:    Days per week: Not on file    Minutes per session: Not on file  . Stress: Not on file  Relationships  . Social connections:    Talks on phone: Not on file    Gets together: Not on file    Attends religious service: Not on file    Active member of club or organization: Not on file    Attends meetings of clubs or organizations: Not on file    Relationship status: Not on file  . Intimate partner violence:    Fear of  current or ex partner: Not on file    Emotionally abused: Not on file    Physically abused: Not on file    Forced sexual activity: Not on file  Other Topics Concern  . Not on file  Social History Narrative  . Not on file      Review of Systems  Constitutional: Negative for chills, fatigue and unexpected weight change.  HENT: Negative for congestion, rhinorrhea, sneezing and sore throat.   Eyes: Negative for photophobia, pain and redness.  Respiratory: Negative for cough, chest tightness and shortness of breath.  Cardiovascular: Negative for chest pain and palpitations.  Gastrointestinal: Negative for abdominal pain, constipation, diarrhea, nausea and vomiting.  Endocrine: Negative.   Genitourinary: Negative for dysuria and frequency.  Musculoskeletal: Negative for arthralgias, back pain, joint swelling and neck pain.       Left foot pain on sole of foot and heel.  Skin: Negative for rash.  Allergic/Immunologic: Negative.   Neurological: Negative for tremors and numbness.  Hematological: Negative for adenopathy. Does not bruise/bleed easily.  Psychiatric/Behavioral: Negative for behavioral problems and sleep disturbance. The patient is not nervous/anxious.     Vital Signs: BP (!) 173/98 (BP Location: Left Arm, Patient Position: Sitting, Cuff Size: Large)   Pulse 71   Resp 16   Ht '5\' 7"'  (1.702 m)   Wt 262 lb 6.4 oz (119 kg)   SpO2 96%   BMI 41.10 kg/m    Physical Exam  Constitutional: She is oriented to person, place, and time. She appears well-developed and well-nourished. No distress.  HENT:  Head: Normocephalic and atraumatic.  Mouth/Throat: Oropharynx is clear and moist. No oropharyngeal exudate.  Eyes: Pupils are equal, round, and reactive to light. EOM are normal.  Neck: Normal range of motion. Neck supple. No JVD present. No tracheal deviation present. No thyromegaly present.  Cardiovascular: Normal rate, regular rhythm and normal heart sounds. Exam reveals no  gallop and no friction rub.  No murmur heard. Pulmonary/Chest: Effort normal and breath sounds normal. No respiratory distress. She has no wheezes. She has no rales. She exhibits no tenderness.  Abdominal: Soft. There is no tenderness. There is no guarding.  Musculoskeletal: Normal range of motion.  Lymphadenopathy:    She has no cervical adenopathy.  Neurological: She is alert and oriented to person, place, and time. No cranial nerve deficit.  Skin: Skin is warm and dry. She is not diaphoretic.  Psychiatric: She has a normal mood and affect. Her behavior is normal. Judgment and thought content normal.  Nursing note and vitals reviewed.   Assessment/Plan: 1. Essential hypertension Elevated today, most likely due to pain. Will monitor in future.   2. Uncontrolled type 2 diabetes mellitus with hyperglycemia (HCC) Pt has not been taking her blood sugar due to other issues. Discussed importance of controlling blood glucose.  Will check A1c in one month, when patient has had time to get her other issues under control.   3. Acute bilateral low back pain without sciatica In physical therapy, followed by Ortho.  4. Mixed hyperlipidemia Will monitor lipid panel at physical, Continue Lipitor as ordered.   5. Obesity, Class II, BMI 35-39.9 Obesity Counseling: Risk Assessment: An assessment of behavioral risk factors was made today and includes lack of exercise sedentary lifestyle, lack of portion control and poor dietary habits.  Risk Modification Advice: She was counseled on portion control guidelines. Restricting daily caloric intake to. . The detrimental long term effects of obesity on her health and ongoing poor compliance was also discussed with the patient.  6. Osteoarthritis of right knee, unspecified osteoarthritis type Followed by ortho, recently had cortisone injection.  She is currently on tramadol for this also.  7. Depression, major, single episode, in partial remission  (HCC) Stable, Continue current therapy of zoloft.  General Counseling: Alanee verbalizes understanding of the findings of todays visit and agrees with plan of treatment. I have discussed any further diagnostic evaluation that may be needed or ordered today. We also reviewed her medications today. she has been encouraged to call the office with any questions or  concerns that should arise related to todays visit.   Time spent: 25 Minutes   This patient was seen by Orson Gear AGNP-C in Collaboration with Dr Lavera Guise as a part of collaborative care agreement    Dr Lavera Guise Internal medicine

## 2018-04-03 NOTE — Patient Instructions (Signed)
Diabetes Mellitus and Nutrition When you have diabetes (diabetes mellitus), it is very important to have healthy eating habits because your blood sugar (glucose) levels are greatly affected by what you eat and drink. Eating healthy foods in the appropriate amounts, at about the same times every day, can help you:  Control your blood glucose.  Lower your risk of heart disease.  Improve your blood pressure.  Reach or maintain a healthy weight.  Every person with diabetes is different, and each person has different needs for a meal plan. Your health care provider may recommend that you work with a diet and nutrition specialist (dietitian) to make a meal plan that is best for you. Your meal plan may vary depending on factors such as:  The calories you need.  The medicines you take.  Your weight.  Your blood glucose, blood pressure, and cholesterol levels.  Your activity level.  Other health conditions you have, such as heart or kidney disease.  How do carbohydrates affect me? Carbohydrates affect your blood glucose level more than any other type of food. Eating carbohydrates naturally increases the amount of glucose in your blood. Carbohydrate counting is a method for keeping track of how many carbohydrates you eat. Counting carbohydrates is important to keep your blood glucose at a healthy level, especially if you use insulin or take certain oral diabetes medicines. It is important to know how many carbohydrates you can safely have in each meal. This is different for every person. Your dietitian can help you calculate how many carbohydrates you should have at each meal and for snack. Foods that contain carbohydrates include:  Bread, cereal, rice, pasta, and crackers.  Potatoes and corn.  Peas, beans, and lentils.  Milk and yogurt.  Fruit and juice.  Desserts, such as cakes, cookies, ice cream, and candy.  How does alcohol affect me? Alcohol can cause a sudden decrease in blood  glucose (hypoglycemia), especially if you use insulin or take certain oral diabetes medicines. Hypoglycemia can be a life-threatening condition. Symptoms of hypoglycemia (sleepiness, dizziness, and confusion) are similar to symptoms of having too much alcohol. If your health care provider says that alcohol is safe for you, follow these guidelines:  Limit alcohol intake to no more than 1 drink per day for nonpregnant women and 2 drinks per day for men. One drink equals 12 oz of beer, 5 oz of wine, or 1 oz of hard liquor.  Do not drink on an empty stomach.  Keep yourself hydrated with water, diet soda, or unsweetened iced tea.  Keep in mind that regular soda, juice, and other mixers may contain a lot of sugar and must be counted as carbohydrates.  What are tips for following this plan? Reading food labels  Start by checking the serving size on the label. The amount of calories, carbohydrates, fats, and other nutrients listed on the label are based on one serving of the food. Many foods contain more than one serving per package.  Check the total grams (g) of carbohydrates in one serving. You can calculate the number of servings of carbohydrates in one serving by dividing the total carbohydrates by 15. For example, if a food has 30 g of total carbohydrates, it would be equal to 2 servings of carbohydrates.  Check the number of grams (g) of saturated and trans fats in one serving. Choose foods that have low or no amount of these fats.  Check the number of milligrams (mg) of sodium in one serving. Most people   should limit total sodium intake to less than 2,300 mg per day.  Always check the nutrition information of foods labeled as "low-fat" or "nonfat". These foods may be higher in added sugar or refined carbohydrates and should be avoided.  Talk to your dietitian to identify your daily goals for nutrients listed on the label. Shopping  Avoid buying canned, premade, or processed foods. These  foods tend to be high in fat, sodium, and added sugar.  Shop around the outside edge of the grocery store. This includes fresh fruits and vegetables, bulk grains, fresh meats, and fresh dairy. Cooking  Use low-heat cooking methods, such as baking, instead of high-heat cooking methods like deep frying.  Cook using healthy oils, such as olive, canola, or sunflower oil.  Avoid cooking with butter, cream, or high-fat meats. Meal planning  Eat meals and snacks regularly, preferably at the same times every day. Avoid going long periods of time without eating.  Eat foods high in fiber, such as fresh fruits, vegetables, beans, and whole grains. Talk to your dietitian about how many servings of carbohydrates you can eat at each meal.  Eat 4-6 ounces of lean protein each day, such as lean meat, chicken, fish, eggs, or tofu. 1 ounce is equal to 1 ounce of meat, chicken, or fish, 1 egg, or 1/4 cup of tofu.  Eat some foods each day that contain healthy fats, such as avocado, nuts, seeds, and fish. Lifestyle   Check your blood glucose regularly.  Exercise at least 30 minutes 5 or more days each week, or as told by your health care provider.  Take medicines as told by your health care provider.  Do not use any products that contain nicotine or tobacco, such as cigarettes and e-cigarettes. If you need help quitting, ask your health care provider.  Work with a counselor or diabetes educator to identify strategies to manage stress and any emotional and social challenges. What are some questions to ask my health care provider?  Do I need to meet with a diabetes educator?  Do I need to meet with a dietitian?  What number can I call if I have questions?  When are the best times to check my blood glucose? Where to find more information:  American Diabetes Association: diabetes.org/food-and-fitness/food  Academy of Nutrition and Dietetics:  www.eatright.org/resources/health/diseases-and-conditions/diabetes  National Institute of Diabetes and Digestive and Kidney Diseases (NIH): www.niddk.nih.gov/health-information/diabetes/overview/diet-eating-physical-activity Summary  A healthy meal plan will help you control your blood glucose and maintain a healthy lifestyle.  Working with a diet and nutrition specialist (dietitian) can help you make a meal plan that is best for you.  Keep in mind that carbohydrates and alcohol have immediate effects on your blood glucose levels. It is important to count carbohydrates and to use alcohol carefully. This information is not intended to replace advice given to you by your health care provider. Make sure you discuss any questions you have with your health care provider. Document Released: 03/18/2005 Document Revised: 07/26/2016 Document Reviewed: 07/26/2016 Elsevier Interactive Patient Education  2018 Elsevier Inc.  

## 2018-04-05 DIAGNOSIS — E119 Type 2 diabetes mellitus without complications: Secondary | ICD-10-CM | POA: Diagnosis not present

## 2018-04-05 DIAGNOSIS — M79672 Pain in left foot: Secondary | ICD-10-CM | POA: Diagnosis not present

## 2018-04-05 DIAGNOSIS — M76822 Posterior tibial tendinitis, left leg: Secondary | ICD-10-CM | POA: Diagnosis not present

## 2018-04-05 DIAGNOSIS — M7732 Calcaneal spur, left foot: Secondary | ICD-10-CM | POA: Diagnosis not present

## 2018-04-05 DIAGNOSIS — M722 Plantar fascial fibromatosis: Secondary | ICD-10-CM | POA: Diagnosis not present

## 2018-04-05 DIAGNOSIS — L851 Acquired keratosis [keratoderma] palmaris et plantaris: Secondary | ICD-10-CM | POA: Diagnosis not present

## 2018-04-19 ENCOUNTER — Encounter: Payer: Self-pay | Admitting: Internal Medicine

## 2018-04-26 DIAGNOSIS — Z23 Encounter for immunization: Secondary | ICD-10-CM | POA: Diagnosis not present

## 2018-05-01 ENCOUNTER — Ambulatory Visit (INDEPENDENT_AMBULATORY_CARE_PROVIDER_SITE_OTHER): Payer: Medicare Other | Admitting: Adult Health

## 2018-05-01 ENCOUNTER — Encounter: Payer: Self-pay | Admitting: Adult Health

## 2018-05-01 VITALS — BP 142/96 | HR 73 | Resp 16 | Ht 67.0 in | Wt 257.0 lb

## 2018-05-01 DIAGNOSIS — E782 Mixed hyperlipidemia: Secondary | ICD-10-CM | POA: Diagnosis not present

## 2018-05-01 DIAGNOSIS — I10 Essential (primary) hypertension: Secondary | ICD-10-CM | POA: Diagnosis not present

## 2018-05-01 DIAGNOSIS — E1165 Type 2 diabetes mellitus with hyperglycemia: Secondary | ICD-10-CM | POA: Diagnosis not present

## 2018-05-01 LAB — POCT GLYCOSYLATED HEMOGLOBIN (HGB A1C): Hemoglobin A1C: 6.6 % — AB (ref 4.0–5.6)

## 2018-05-01 MED ORDER — DULAGLUTIDE 0.75 MG/0.5ML ~~LOC~~ SOAJ: 0.7500 mg | SUBCUTANEOUS | 2 refills | Status: DC

## 2018-05-01 MED ORDER — BISOPROLOL-HYDROCHLOROTHIAZIDE 5-6.25 MG PO TABS: 1.0000 | ORAL_TABLET | Freq: Every day | ORAL | 2 refills | Status: DC

## 2018-05-01 NOTE — Progress Notes (Signed)
[facility] [place], [place] [address]  Internal MEDICINE  Office Visit Note  Patient Name: Felicia Woods  [id number]  [id number]  Date of Service: 05/01/2018  Chief Complaint  Patient presents with  . Diabetes  . Hypertension  . Hyperlipidemia  . Quality Metric Gaps    wellness visit needed     HPI Patient here for follow-up on diabetes, hypertension, and hyperlipidemia.  Patient's A1c today is 6.6.  Continues to only take Trulicity for diabetes at this time.  She denies any issues with blood sugars at home.  She continues to take her blood sugar morning and night and has a log with her today.  Her high a.m. sugar as of recent is 132 mg/dL, her height in the evening appears to be approximately 150 mg/dL.  She also has some low blood sugars early in the morning as low as 37 at times.  She denies feeling bad when her blood sugars are low we discussed what to do when transfer self to have a low blood sugar.  Her blood pressure continues to be elevated at this visit.  It was found to be 142/96.  She states that she has been taking her blood pressure medicine as prescribed.  She denies any headaches, short of breath, chest pain, or palpitations.  She does not have any recent lab work for surveillance of her hyperlipidemia, and she is in need of a wellness visit to close her quality metric gaps.  We will schedule a wellness visit at this time.    Current Medication: Outpatient Encounter Medications as of 05/01/2018  Medication Sig  . ACCU-CHEK SOFTCLIX LANCETS lancets Use as instructed once  DAILY DIAG -E11.9  . albuterol (PROVENTIL HFA;VENTOLIN HFA) 108 (90 Base) MCG/ACT inhaler Inhale 2 puffs into the lungs every 6 (six) hours as needed for wheezing or shortness of breath.  Marland Kitchen amLODipine (NORVASC) 10 MG tablet Take 1 tablet (10 mg total) by mouth daily.  Marland Kitchen aspirin EC 81 MG tablet Take 81 mg by mouth daily.  Marland Kitchen atorvastatin (LIPITOR) 20 MG tablet Take 1 tablet (20  mg total) by mouth daily.  . budesonide-formoterol (SYMBICORT) 160-4.5 MCG/ACT inhaler Inhale 2 puffs into the lungs 2 (two) times daily.  . clopidogrel (PLAVIX) 75 MG tablet Take 1 tablet (75 mg total) by mouth daily.  . cyclobenzaprine (FLEXERIL) 10 MG tablet Take 1 tablet (10 mg total) by mouth 3 (three) times daily as needed for muscle spasms.  . Dulaglutide (TRULICITY) 1.77 LT/9.0ZE SOPN Inject 0.75 mg into the skin once a week.  . estradiol (ESTRACE) 0.5 MG tablet Take 1 tablet (0.5 mg total) by mouth daily.  . furosemide (LASIX) 40 MG tablet Take 1 tablet (40 mg total) by mouth 2 (two) times daily.  Marland Kitchen glucose blood (ACCU-CHEK AVIVA PLUS) test strip 1 each by Other route as needed for other. Use as instructed once  DAILY DIAG -E11.9  . losartan (COZAAR) 100 MG tablet Take one tab po qd  . montelukast (SINGULAIR) 10 MG tablet Take 1 tablet (10 mg total) by mouth daily.  . nitroGLYCERIN (NITROSTAT) 0.4 MG SL tablet Place 0.4 mg under the tongue every 5 (five) minutes x 3 doses as needed for chest pain. *If no relief call MD or go to Emergency Room*  . oxybutynin (DITROPAN) 5 MG tablet Take 1 tablet (5 mg total) by mouth daily after supper.  . ranitidine (ZANTAC) 150 MG tablet Take 1 tablet (150 mg total) by mouth 2 (  two) times daily.  . sertraline (ZOLOFT) 100 MG tablet Take 1 tablet (100 mg total) by mouth daily.  Marland Kitchen zolpidem (AMBIEN) 10 MG tablet Take 1 tablet (10 mg total) by mouth at bedtime.  . [DISCONTINUED] bisoprolol-hydrochlorothiazide (ZIAC) 2.5-6.25 MG tablet Take 1 tablet by mouth daily.  . [DISCONTINUED] Dulaglutide (TRULICITY) 2.94 TM/5.4YT SOPN Inject 0.75 mg into the skin once a week.  . bisoprolol-hydrochlorothiazide (ZIAC) 5-6.25 MG tablet Take 1 tablet by mouth daily.   No facility-administered encounter medications on file as of 05/01/2018.     Surgical History: Past Surgical History:  Procedure Laterality Date  . ABDOMINAL HYSTERECTOMY    . CARDIAC CATHETERIZATION     . CARDIAC CATHETERIZATION N/A 10/18/2015   Procedure: Left Heart Cath and Cors/Grafts Angiography;  Surgeon: Lorretta Harp, MD;  Location: [facility];  Service: Cardiovascular;  Laterality: N/A;  . CARDIAC CATHETERIZATION N/A 10/18/2015   Procedure: Coronary Stent Intervention;  Surgeon: Lorretta Harp, MD;  Location: [place] [facility];  Service: Cardiovascular;  Laterality: N/A;  . CARDIAC SURGERY    . CHOLECYSTECTOMY    . CORONARY ANGIOPLASTY    . CORONARY ARTERY BYPASS GRAFT     4 vessels - 2010  . CORONARY STENT INTERVENTION N/A 02/16/2017   Procedure: CORONARY STENT INTERVENTION;  Surgeon: Yolonda Kida, MD;  Location: [place] [facility];  Service: Cardiovascular;  Laterality: N/A;  . LEFT HEART CATH AND CORONARY ANGIOGRAPHY Left 02/16/2017   Procedure: LEFT HEART CATH AND CORONARY ANGIOGRAPHY;  Surgeon: Corey Skains, MD;  Location: [facility];  Service: Cardiovascular;  Laterality: Left;  . LEFT HEART CATH AND CORS/GRAFTS ANGIOGRAPHY Left 11/23/2017   Procedure: LEFT HEART CATH AND CORS/GRAFTS ANGIOGRAPHY;  Surgeon: Corey Skains, MD;  Location: [facility];  Service: Cardiovascular;  Laterality: Left;    Medical History: Past Medical History:  Diagnosis Date  . Asthma   . Coronary artery disease   . Diabetes mellitus without complication ([place])   . Hyperlipidemia   . Hypertension   . MI (myocardial infarction) ([place])   . Migraine headache with aura   . 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    Family History: Family History  Problem Relation Age of Onset  . Diabetes Mother   . Diabetes Father   . Cancer Father   . Diabetes Brother     Social History   Socioeconomic History  . Marital status: Single    Spouse name: Not on file  . Number of children: Not on file  . Years of education: Not on file  . Highest education level: Not on file  Occupational History  . Not on file  Social Needs  . Financial resource strain: Not on  file  . Food insecurity:    Worry: Not on file    Inability: Not on file  . Transportation needs:    Medical: Not on file    Non-medical: Not on file  Tobacco Use  . Smoking status: Never Smoker  . Smokeless tobacco: Never Used  Substance and Sexual Activity  . Alcohol use: No    Alcohol/week: 0.0 standard drinks  . Drug use: No  . Sexual activity: Yes  Lifestyle  . Physical activity:    Days per week: Not on file    Minutes per session: Not on file  . Stress: Not on file  Relationships  . Social connections:    Talks on phone: Not on file  Gets together: Not on file    Attends religious service: Not on file    Active member of club or organization: Not on file    Attends meetings of clubs or organizations: Not on file    Relationship status: Not on file  . Intimate partner violence:    Fear of current or ex partner: Not on file    Emotionally abused: Not on file    Physically abused: Not on file    Forced sexual activity: Not on file  Other Topics Concern  . Not on file  Social History Narrative  . Not on file      Review of Systems  Constitutional: Negative for chills, fatigue and unexpected weight change.  HENT: Negative for congestion, rhinorrhea, sneezing and sore throat.   Eyes: Negative for photophobia, pain and redness.  Respiratory: Negative for cough, chest tightness and shortness of breath.   Cardiovascular: Negative for chest pain and palpitations.  Gastrointestinal: Negative for abdominal pain, constipation, diarrhea, nausea and vomiting.  Endocrine: Negative.   Genitourinary: Negative for dysuria and frequency.  Musculoskeletal: Negative for arthralgias, back pain, joint swelling and neck pain.  Skin: Negative for rash.  Allergic/Immunologic: Negative.   Neurological: Negative for tremors and numbness.  Hematological: Negative for adenopathy. Does not bruise/bleed easily.  Psychiatric/Behavioral: Negative for behavioral problems and sleep  disturbance. The patient is not nervous/anxious.     Vital Signs: BP (!) 142/96   Pulse 73   Resp 16   Ht '5\' 7"'  (1.702 m)   Wt 257 lb (116.6 kg)   SpO2 92%   BMI 40.25 kg/m    Physical Exam  Constitutional: She is oriented to person, place, and time. She appears well-developed and well-nourished. No distress.  HENT:  Head: Normocephalic and atraumatic.  Mouth/Throat: Oropharynx is clear and moist. No oropharyngeal exudate.  Eyes: Pupils are equal, round, and reactive to light. EOM are normal.  Neck: Normal range of motion. Neck supple. No JVD present. No tracheal deviation present. No thyromegaly present.  Cardiovascular: Normal rate, regular rhythm and normal heart sounds. Exam reveals no gallop and no friction rub.  No murmur heard. Pulmonary/Chest: Effort normal and breath sounds normal. No respiratory distress. She has no wheezes. She has no rales. She exhibits no tenderness.  Abdominal: Soft. There is no tenderness. There is no guarding.  Musculoskeletal: Normal range of motion.  Lymphadenopathy:    She has no cervical adenopathy.  Neurological: She is alert and oriented to person, place, and time. No cranial nerve deficit.  Skin: Skin is warm and dry. She is not diaphoretic.  Psychiatric: She has a normal mood and affect. Her behavior is normal. Judgment and thought content normal.  Nursing note and vitals reviewed.   Assessment/Plan: 1. Uncontrolled type 2 diabetes mellitus with hyperglycemia (HCC) Patient's A1c 6.6.  Refilled Trulicity.  Discussed ways to treat hypoglycemia, even the patient denies any symptoms.  She will continue to log her blood sugars for next visit. - POCT HgB A1C - Dulaglutide (TRULICITY) 7.56 EP/3.2RJ SOPN; Inject 0.75 mg into the skin once a week.  Dispense: 4 pen; Refill: 2  2. Essential hypertension Increase the patient's bisoprolol/HCTZ to 5/6.25 mg.  Sent Rx to pharmacy.  Discussed lifestyle changes to lower blood pressure with  patient.  3. Mixed hyperlipidemia Patient is in need of lipid panel and will have a full set of labs drawn for physical that he is scheduled at this visit.  General Counseling: Tyreka verbalizes understanding of the  findings of todays visit and agrees with plan of treatment. I have discussed any further diagnostic evaluation that may be needed or ordered today. We also reviewed her medications today. she has been encouraged to call the office with any questions or concerns that should arise related to todays visit.    Orders Placed This Encounter  Procedures  . POCT HgB A1C    Meds ordered this encounter  Medications  . Dulaglutide (TRULICITY) 4.15 VI/1.61YO SOPN    Sig: Inject 0.75 mg into the skin once a week.    Dispense:  4 pen    Refill:  2  . bisoprolol-hydrochlorothiazide (ZIAC) 5-6.25 MG tablet    Sig: Take 1 tablet by mouth daily.    Dispense:  30 tablet    Refill:  2    Time spent: 25 Minutes   This patient was seen by Orson Gear AGNP-C in Collaboration with Dr Lavera Guise as a part of collaborative care agreement     Kendell Bane AGNP-C Internal medicine

## 2018-05-01 NOTE — Patient Instructions (Signed)

## 2018-05-24 DIAGNOSIS — M722 Plantar fascial fibromatosis: Secondary | ICD-10-CM | POA: Diagnosis not present

## 2018-05-29 DIAGNOSIS — R928 Other abnormal and inconclusive findings on diagnostic imaging of breast: Secondary | ICD-10-CM | POA: Diagnosis not present

## 2018-06-12 ENCOUNTER — Ambulatory Visit (INDEPENDENT_AMBULATORY_CARE_PROVIDER_SITE_OTHER): Payer: Medicare Other | Admitting: Adult Health

## 2018-06-12 ENCOUNTER — Encounter: Payer: Self-pay | Admitting: Adult Health

## 2018-06-12 VITALS — BP 141/92 | HR 76 | Resp 16 | Ht 67.0 in | Wt 262.0 lb

## 2018-06-12 DIAGNOSIS — N3281 Overactive bladder: Secondary | ICD-10-CM

## 2018-06-12 DIAGNOSIS — R059 Cough, unspecified: Secondary | ICD-10-CM

## 2018-06-12 DIAGNOSIS — I10 Essential (primary) hypertension: Secondary | ICD-10-CM

## 2018-06-12 DIAGNOSIS — R300 Dysuria: Secondary | ICD-10-CM | POA: Diagnosis not present

## 2018-06-12 DIAGNOSIS — Z6835 Body mass index (BMI) 35.0-35.9, adult: Secondary | ICD-10-CM | POA: Diagnosis not present

## 2018-06-12 DIAGNOSIS — E669 Obesity, unspecified: Secondary | ICD-10-CM

## 2018-06-12 DIAGNOSIS — J302 Other seasonal allergic rhinitis: Secondary | ICD-10-CM | POA: Diagnosis not present

## 2018-06-12 DIAGNOSIS — Z0001 Encounter for general adult medical examination with abnormal findings: Secondary | ICD-10-CM | POA: Diagnosis not present

## 2018-06-12 DIAGNOSIS — K219 Gastro-esophageal reflux disease without esophagitis: Secondary | ICD-10-CM | POA: Diagnosis not present

## 2018-06-12 DIAGNOSIS — E782 Mixed hyperlipidemia: Secondary | ICD-10-CM | POA: Diagnosis not present

## 2018-06-12 DIAGNOSIS — E119 Type 2 diabetes mellitus without complications: Secondary | ICD-10-CM

## 2018-06-12 DIAGNOSIS — R05 Cough: Secondary | ICD-10-CM

## 2018-06-12 DIAGNOSIS — J0110 Acute frontal sinusitis, unspecified: Secondary | ICD-10-CM

## 2018-06-12 DIAGNOSIS — E66812 Obesity, class 2: Secondary | ICD-10-CM

## 2018-06-12 MED ORDER — AZITHROMYCIN 250 MG PO TABS: ORAL_TABLET | ORAL | 0 refills | Status: DC

## 2018-06-12 MED ORDER — BISOPROLOL-HYDROCHLOROTHIAZIDE 5-6.25 MG PO TABS: 1.0000 | ORAL_TABLET | Freq: Every day | ORAL | 2 refills | Status: DC

## 2018-06-12 MED ORDER — MONTELUKAST SODIUM 10 MG PO TABS: 10.0000 mg | ORAL_TABLET | Freq: Every day | ORAL | 1 refills | Status: DC

## 2018-06-12 MED ORDER — AMLODIPINE BESYLATE 10 MG PO TABS: 10.0000 mg | ORAL_TABLET | Freq: Every day | ORAL | 1 refills | Status: DC

## 2018-06-12 MED ORDER — OXYBUTYNIN CHLORIDE 5 MG PO TABS: 5.0000 mg | ORAL_TABLET | Freq: Every day | ORAL | 1 refills | Status: DC

## 2018-06-12 MED ORDER — ESOMEPRAZOLE MAGNESIUM 40 MG PO CPDR: 40.0000 mg | DELAYED_RELEASE_CAPSULE | Freq: Every day | ORAL | 1 refills | Status: DC

## 2018-06-12 NOTE — Patient Instructions (Signed)

## 2018-06-12 NOTE — Progress Notes (Signed)
[facility] [place], [place] [address]  Internal MEDICINE  Office Visit Note  Patient Name: Felicia Woods  [id number]  [id number]  Date of Service: 06/12/2018  Chief Complaint  Patient presents with  . Annual Exam  . Diabetes  . Hypertension  . Hyperlipidemia     HPI Pt is here for routine health maintenance examination.  Patient is a well-appearing 62 year old African-American female.  She has a history of diabetes, hypertension, and hyperlipidemia.  She denies any recent issues.  She has had no hospitalizations.  She denies any chest pain, hemoptysis, shortness of breath, or fatigue.  She has been having sinus pain pressure with some postnasal drip.  This is making her cough and she feels like this infection has moved down into her lungs.  Current Medication: Outpatient Encounter Medications as of 06/12/2018  Medication Sig  . ACCU-CHEK SOFTCLIX LANCETS lancets Use as instructed once  DAILY DIAG -E11.9  . albuterol (PROVENTIL HFA;VENTOLIN HFA) 108 (90 Base) MCG/ACT inhaler Inhale 2 puffs into the lungs every 6 (six) hours as needed for wheezing or shortness of breath.  Marland Kitchen amLODipine (NORVASC) 10 MG tablet Take 1 tablet (10 mg total) by mouth daily.  Marland Kitchen aspirin EC 81 MG tablet Take 81 mg by mouth daily.  Marland Kitchen atorvastatin (LIPITOR) 20 MG tablet Take 1 tablet (20 mg total) by mouth daily.  . bisoprolol-hydrochlorothiazide (ZIAC) 5-6.25 MG tablet Take 1 tablet by mouth daily.  . budesonide-formoterol (SYMBICORT) 160-4.5 MCG/ACT inhaler Inhale 2 puffs into the lungs 2 (two) times daily.  . clopidogrel (PLAVIX) 75 MG tablet Take 1 tablet (75 mg total) by mouth daily.  . cyclobenzaprine (FLEXERIL) 10 MG tablet Take 1 tablet (10 mg total) by mouth 3 (three) times daily as needed for muscle spasms.  . Dulaglutide (TRULICITY) 9.93 ZJ/6.9CV SOPN Inject 0.75 mg into the skin once a week.  . estradiol (ESTRACE) 0.5 MG tablet Take 1 tablet (0.5 mg total) by mouth daily.   . furosemide (LASIX) 40 MG tablet Take 1 tablet (40 mg total) by mouth 2 (two) times daily.  Marland Kitchen glucose blood (ACCU-CHEK AVIVA PLUS) test strip 1 each by Other route as needed for other. Use as instructed once  DAILY DIAG -E11.9  . losartan (COZAAR) 100 MG tablet Take one tab po qd  . montelukast (SINGULAIR) 10 MG tablet Take 1 tablet (10 mg total) by mouth daily.  . nitroGLYCERIN (NITROSTAT) 0.4 MG SL tablet Place 0.4 mg under the tongue every 5 (five) minutes x 3 doses as needed for chest pain. *If no relief call MD or go to Emergency Room*  . oxybutynin (DITROPAN) 5 MG tablet Take 1 tablet (5 mg total) by mouth daily after supper.  . sertraline (ZOLOFT) 100 MG tablet Take 1 tablet (100 mg total) by mouth daily.  Marland Kitchen zolpidem (AMBIEN) 10 MG tablet Take 1 tablet (10 mg total) by mouth at bedtime.  . [DISCONTINUED] amLODipine (NORVASC) 10 MG tablet Take 1 tablet (10 mg total) by mouth daily.  . [DISCONTINUED] bisoprolol-hydrochlorothiazide (ZIAC) 5-6.25 MG tablet Take 1 tablet by mouth daily.  . [DISCONTINUED] montelukast (SINGULAIR) 10 MG tablet Take 1 tablet (10 mg total) by mouth daily.  . [DISCONTINUED] oxybutynin (DITROPAN) 5 MG tablet Take 1 tablet (5 mg total) by mouth daily after supper.  . [DISCONTINUED] ranitidine (ZANTAC) 150 MG tablet Take 1 tablet (150 mg total) by mouth 2 (two) times daily.  Marland Kitchen azithromycin (ZITHROMAX) 250 MG tablet Take as directed.  Marland Kitchen esomeprazole ([place])  40 MG capsule Take 1 capsule (40 mg total) by mouth daily.   No facility-administered encounter medications on file as of 06/12/2018.     Surgical History: Past Surgical History:  Procedure Laterality Date  . ABDOMINAL HYSTERECTOMY    . CARDIAC CATHETERIZATION    . CARDIAC CATHETERIZATION N/A 10/18/2015   Procedure: Left Heart Cath and Cors/Grafts Angiography;  Surgeon: Lorretta Harp, MD;  Location: [facility];  Service: Cardiovascular;  Laterality: N/A;  . CARDIAC CATHETERIZATION N/A 10/18/2015    Procedure: Coronary Stent Intervention;  Surgeon: Lorretta Harp, MD;  Location: [facility];  Service: Cardiovascular;  Laterality: N/A;  . CARDIAC SURGERY    . CHOLECYSTECTOMY    . CORONARY ANGIOPLASTY    . CORONARY ARTERY BYPASS GRAFT     4 vessels - 2010  . CORONARY STENT INTERVENTION N/A 02/16/2017   Procedure: CORONARY STENT INTERVENTION;  Surgeon: Yolonda Kida, MD;  Location: [facility];  Service: Cardiovascular;  Laterality: N/A;  . LEFT HEART CATH AND CORONARY ANGIOGRAPHY Left 02/16/2017   Procedure: LEFT HEART CATH AND CORONARY ANGIOGRAPHY;  Surgeon: Corey Skains, MD;  Location: [facility];  Service: Cardiovascular;  Laterality: Left;  . LEFT HEART CATH AND CORS/GRAFTS ANGIOGRAPHY Left 11/23/2017   Procedure: LEFT HEART CATH AND CORS/GRAFTS ANGIOGRAPHY;  Surgeon: Corey Skains, MD;  Location: [facility];  Service: Cardiovascular;  Laterality: Left;    Medical History: Past Medical History:  Diagnosis Date  . Asthma   . Coronary artery disease   . Diabetes mellitus without complication ([place])   . Hyperlipidemia   . Hypertension   . MI (myocardial infarction) ([place])   . Migraine headache with aura   . 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    Family History: Family History  Problem Relation Age of Onset  . Diabetes Mother   . Diabetes Father   . Cancer Father   . Diabetes Brother    Depression screen [facility] 2/9 06/12/2018 04/03/2018 01/11/2018 07/29/2017 11/12/2016  Decreased Interest 0 0 0 0 1  Down, Depressed, Hopeless 0 0 0 0 1  PHQ - 2 Score 0 0 0 0 2  Altered sleeping - - - - 0  Tired, decreased energy - - - - 1  Change in appetite - - - - 3  Feeling bad or failure about yourself  - - - - 0  Trouble concentrating - - - - 0  Moving slowly or fidgety/restless - - - - 1  Suicidal thoughts - - - - 0  PHQ-9 Score - - - - 7  Difficult doing work/chores - - - - Not difficult at all    Functional Status Survey: Is the  patient deaf or have difficulty hearing?: No Does the patient have difficulty seeing, even when wearing glasses/contacts?: No Does the patient have difficulty concentrating, remembering, or making decisions?: No Does the patient have difficulty walking or climbing stairs?: No Does the patient have difficulty dressing or bathing?: No Does the patient have difficulty doing errands alone such as visiting a doctor's office or shopping?: No  MMSE - [place] Exam 06/12/2018  Orientation to time 5  Orientation to Place 5  Registration 3  Attention/ Calculation 5  Recall 3  Language- name 2 objects 2  Language- repeat 1  Language- follow 3 step command 3  Language- read & follow direction 1  Write a sentence 1  Copy design 1  Total score 30    Fall Risk  06/12/2018 04/03/2018 01/11/2018 08/01/2017 07/29/2017  Falls in the past year? 0 No No No No      Review of Systems  Constitutional: Positive for chills. Negative for fatigue and unexpected weight change.  HENT: Positive for postnasal drip, rhinorrhea, sinus pressure, sinus pain, sneezing and sore throat. Negative for congestion.   Eyes: Negative for photophobia, pain and redness.  Respiratory: Positive for cough. Negative for chest tightness and shortness of breath.   Cardiovascular: Negative for chest pain and palpitations.  Gastrointestinal: Negative for abdominal pain, constipation, diarrhea, nausea and vomiting.  Endocrine: Negative.   Genitourinary: Negative for dysuria and frequency.  Musculoskeletal: Negative for arthralgias, back pain, joint swelling and neck pain.  Skin: Negative for rash.  Allergic/Immunologic: Negative.   Neurological: Negative for tremors and numbness.  Hematological: Negative for adenopathy. Does not bruise/bleed easily.  Psychiatric/Behavioral: Negative for behavioral problems and sleep disturbance. The patient is not nervous/anxious.      Vital Signs: BP (!) 141/92 (BP Location: Left Arm,  Patient Position: Sitting, Cuff Size: Normal)   Pulse 76   Resp 16   Ht '5\' 7"'  (1.702 m)   Wt 262 lb (118.8 kg)   SpO2 98%   BMI 41.04 kg/m    Physical Exam  Constitutional: She is oriented to person, place, and time. She appears well-developed and well-nourished. No distress.  HENT:  Head: Normocephalic and atraumatic.  Mouth/Throat: No oropharyngeal exudate.  redness to oropharynx  Eyes: Pupils are equal, round, and reactive to light. EOM are normal.  Neck: Normal range of motion. Neck supple. No JVD present. No tracheal deviation present. No thyromegaly present.  Cardiovascular: Normal rate, regular rhythm and normal heart sounds. Exam reveals no gallop and no friction rub.  No murmur heard. Pulmonary/Chest: Effort normal and breath sounds normal. No respiratory distress. She has no wheezes. She has no rales. She exhibits no tenderness.  PT declined Breast exam.   Abdominal: Soft. There is no tenderness. There is no guarding.  Musculoskeletal: Normal range of motion.  Lymphadenopathy:    She has no cervical adenopathy.  Neurological: She is alert and oriented to person, place, and time. No cranial nerve deficit.  Skin: Skin is warm and dry. She is not diaphoretic.  Psychiatric: She has a normal mood and affect. Her behavior is normal. Judgment and thought content normal.  Nursing note and vitals reviewed.    LABS: Recent Results (from the past 2160 hour(s))  POCT HgB A1C     Status: Abnormal   Collection Time: 05/01/18 11:41 AM  Result Value Ref Range   Hemoglobin A1C 6.6 (A) 4.0 - 5.6 %   HbA1c POC (<> result, manual entry)     HbA1c, POC (prediabetic range)     HbA1c, POC (controlled diabetic range)     Assessment/Plan: 1. Encounter for general adult medical examination with abnormal findings Patient is up-to-date on preventative health maintenance. Labs ordered we will treat as results are available. - CBC with Differential/Platelet - Lipid Panel With LDL/HDL  Ratio - TSH - T4, free - Comprehensive metabolic panel  2. Diabetes mellitus without complication ([place]) Patient diabetes appears to be doing well.  Her A1c at last was 6.6.  Patient to continue current medication regimen as prescribed.  3. Essential hypertension Patient blood pressure slightly elevated at today's visit.  Patient reports she takes blood pressure outside the office it is normal.  She states she has been taking her medications.  She denies any headaches, shortness of breath, or palpitations or chest pain.  We will continue to follow. - bisoprolol-hydrochlorothiazide (ZIAC) 5-6.25 MG tablet; Take 1 tablet by mouth daily.  Dispense: 30 tablet; Refill: 2 - amLODipine (NORVASC) 10 MG tablet; Take 1 tablet (10 mg total) by mouth daily.  Dispense: 90 tablet; Refill: 1  4. Mixed hyperlipidemia Lipid panel ordered at today's visit.  Will continue to follow results are available.  5. Gastroesophageal reflux disease without esophagitis Patient was taking tach which has been recalled.  Will give patient trial of Nexium for her GERD. - esomeprazole (NEXIUM) 40 MG capsule; Take 1 capsule (40 mg total) by mouth daily.  Dispense: 30 capsule; Refill: 1  6. Obesity, Class II, BMI 35-39.9 Obesity Counseling: Risk Assessment: An assessment of behavioral risk factors was made today and includes lack of exercise sedentary lifestyle, lack of portion control and poor dietary habits.  Risk Modification Advice: She was counseled on portion control guidelines. Restricting daily caloric intake to. . The detrimental long term effects of obesity on her health and ongoing poor compliance was also discussed with the patient.  7. Dysuria - UA/M w/rflx Culture, Routine - CBC with Differential/Platelet  8. Seasonal allergies Refilled patient's Singulair. - montelukast (SINGULAIR) 10 MG tablet; Take 1 tablet (10 mg total) by mouth daily.  Dispense: 90 tablet; Refill: 1  9. OAB (overactive  bladder) Refill patient's Ditropan.  - oxybutynin (DITROPAN) 5 MG tablet; Take 1 tablet (5 mg total) by mouth daily after supper.  Dispense: 90 tablet; Refill: 1  10. Acute non-recurrent frontal sinusitis Patient likely with sinusitis.  Will give Z-Pak with instructions return to clinic if symptoms worsen or fail to improve. - azithromycin (ZITHROMAX) 250 MG tablet; Take as directed.  Dispense: 6 tablet; Refill: 0  11. Cough Patient's cough likely due to postnasal drip from sinusitis.  Anti-inflammatory properties of Z-Pak should assist if there is irritation in the bronchials.  General Counseling: [place] verbalizes understanding of the findings of todays visit and agrees with plan of treatment. I have discussed any further diagnostic evaluation that may be needed or ordered today. We also reviewed her medications today. she has been encouraged to call the office with any questions or concerns that should arise related to todays visit.   Orders Placed This Encounter  Procedures  . UA/M w/rflx Culture, Routine  . CBC with Differential/Platelet  . Lipid Panel With LDL/HDL Ratio  . TSH  . T4, free  . Comprehensive metabolic panel    Meds ordered this encounter  Medications  . esomeprazole (NEXIUM) 40 MG capsule    Sig: Take 1 capsule (40 mg total) by mouth daily.    Dispense:  30 capsule    Refill:  1  . bisoprolol-hydrochlorothiazide (ZIAC) 5-6.25 MG tablet    Sig: Take 1 tablet by mouth daily.    Dispense:  30 tablet    Refill:  2  . amLODipine (NORVASC) 10 MG tablet    Sig: Take 1 tablet (10 mg total) by mouth daily.    Dispense:  90 tablet    Refill:  1  . oxybutynin (DITROPAN) 5 MG tablet    Sig: Take 1 tablet (5 mg total) by mouth daily after supper.    Dispense:  90 tablet    Refill:  1  . montelukast (SINGULAIR) 10 MG tablet    Sig: Take 1 tablet (10 mg total) by mouth daily.    Dispense:  90 tablet    Refill:  1  . azithromycin (ZITHROMAX) 250 MG tablet    Sig:  Take as directed.    Dispense:  6 tablet    Refill:  0    Time spent: 35 Minutes   This patient was seen by Orson Gear AGNP-C in Collaboration with Dr Lavera Guise as a part of collaborative care agreement    Kendell Bane AGNP-C Internal Medicine

## 2018-06-13 LAB — MICROSCOPIC EXAMINATION
BACTERIA UA: NONE SEEN
Casts: NONE SEEN /lpf

## 2018-06-13 LAB — UA/M W/RFLX CULTURE, ROUTINE
BILIRUBIN UA: NEGATIVE
GLUCOSE, UA: NEGATIVE
KETONES UA: NEGATIVE
LEUKOCYTES UA: NEGATIVE
NITRITE UA: NEGATIVE
PROTEIN UA: NEGATIVE
RBC UA: NEGATIVE
SPEC GRAV UA: 1.019 (ref 1.005–1.030)
UUROB: 0.2 mg/dL (ref 0.2–1.0)
pH, UA: 5.5 (ref 5.0–7.5)

## 2018-06-19 DIAGNOSIS — I10 Essential (primary) hypertension: Secondary | ICD-10-CM | POA: Diagnosis not present

## 2018-06-19 DIAGNOSIS — I25810 Atherosclerosis of coronary artery bypass graft(s) without angina pectoris: Secondary | ICD-10-CM | POA: Diagnosis not present

## 2018-06-19 DIAGNOSIS — E782 Mixed hyperlipidemia: Secondary | ICD-10-CM | POA: Diagnosis not present

## 2018-06-19 DIAGNOSIS — R05 Cough: Secondary | ICD-10-CM | POA: Diagnosis not present

## 2018-07-28 DIAGNOSIS — H5203 Hypermetropia, bilateral: Secondary | ICD-10-CM | POA: Diagnosis not present

## 2018-07-28 DIAGNOSIS — H52223 Regular astigmatism, bilateral: Secondary | ICD-10-CM | POA: Diagnosis not present

## 2018-07-28 DIAGNOSIS — H524 Presbyopia: Secondary | ICD-10-CM | POA: Diagnosis not present

## 2018-07-28 DIAGNOSIS — E119 Type 2 diabetes mellitus without complications: Secondary | ICD-10-CM | POA: Diagnosis not present

## 2018-07-28 DIAGNOSIS — Z7984 Long term (current) use of oral hypoglycemic drugs: Secondary | ICD-10-CM | POA: Diagnosis not present

## 2018-07-28 DIAGNOSIS — H2513 Age-related nuclear cataract, bilateral: Secondary | ICD-10-CM | POA: Diagnosis not present

## 2018-08-21 DIAGNOSIS — J45909 Unspecified asthma, uncomplicated: Secondary | ICD-10-CM | POA: Insufficient documentation

## 2018-08-21 DIAGNOSIS — I25810 Atherosclerosis of coronary artery bypass graft(s) without angina pectoris: Secondary | ICD-10-CM | POA: Insufficient documentation

## 2018-08-21 DIAGNOSIS — M545 Low back pain, unspecified: Secondary | ICD-10-CM | POA: Insufficient documentation

## 2018-08-21 DIAGNOSIS — F324 Major depressive disorder, single episode, in partial remission: Secondary | ICD-10-CM | POA: Insufficient documentation

## 2018-09-08 ENCOUNTER — Encounter: Payer: Self-pay | Admitting: Adult Health

## 2018-09-08 ENCOUNTER — Ambulatory Visit (INDEPENDENT_AMBULATORY_CARE_PROVIDER_SITE_OTHER): Payer: Medicare Other | Admitting: Adult Health

## 2018-09-08 VITALS — BP 132/78 | HR 76 | Resp 16 | Ht 67.0 in | Wt 261.0 lb

## 2018-09-08 DIAGNOSIS — M545 Low back pain, unspecified: Secondary | ICD-10-CM

## 2018-09-08 DIAGNOSIS — E1165 Type 2 diabetes mellitus with hyperglycemia: Secondary | ICD-10-CM

## 2018-09-08 DIAGNOSIS — N951 Menopausal and female climacteric states: Secondary | ICD-10-CM | POA: Diagnosis not present

## 2018-09-08 DIAGNOSIS — Z955 Presence of coronary angioplasty implant and graft: Secondary | ICD-10-CM

## 2018-09-08 DIAGNOSIS — G4709 Other insomnia: Secondary | ICD-10-CM

## 2018-09-08 DIAGNOSIS — F324 Major depressive disorder, single episode, in partial remission: Secondary | ICD-10-CM

## 2018-09-08 DIAGNOSIS — J302 Other seasonal allergic rhinitis: Secondary | ICD-10-CM | POA: Diagnosis not present

## 2018-09-08 DIAGNOSIS — E782 Mixed hyperlipidemia: Secondary | ICD-10-CM | POA: Diagnosis not present

## 2018-09-08 DIAGNOSIS — I10 Essential (primary) hypertension: Secondary | ICD-10-CM | POA: Diagnosis not present

## 2018-09-08 DIAGNOSIS — N3281 Overactive bladder: Secondary | ICD-10-CM | POA: Diagnosis not present

## 2018-09-08 LAB — POCT GLYCOSYLATED HEMOGLOBIN (HGB A1C): Hemoglobin A1C: 6.4 % — AB (ref 4.0–5.6)

## 2018-09-08 MED ORDER — DULAGLUTIDE 0.75 MG/0.5ML ~~LOC~~ SOAJ: 0.7500 mg | SUBCUTANEOUS | 2 refills | Status: DC

## 2018-09-08 MED ORDER — ZOLPIDEM TARTRATE 10 MG PO TABS: 10.0000 mg | ORAL_TABLET | Freq: Every day | ORAL | 1 refills | Status: DC

## 2018-09-08 MED ORDER — LOSARTAN POTASSIUM 100 MG PO TABS: ORAL_TABLET | ORAL | 3 refills | Status: DC

## 2018-09-08 MED ORDER — ESTRADIOL 0.5 MG PO TABS: 0.5000 mg | ORAL_TABLET | Freq: Every day | ORAL | 1 refills | Status: DC

## 2018-09-08 MED ORDER — FUROSEMIDE 40 MG PO TABS: 40.0000 mg | ORAL_TABLET | Freq: Two times a day (BID) | ORAL | 1 refills | Status: DC

## 2018-09-08 MED ORDER — ATORVASTATIN CALCIUM 20 MG PO TABS: 20.0000 mg | ORAL_TABLET | Freq: Every day | ORAL | 3 refills | Status: DC

## 2018-09-08 MED ORDER — CYCLOBENZAPRINE HCL 10 MG PO TABS: 10.0000 mg | ORAL_TABLET | Freq: Three times a day (TID) | ORAL | 0 refills | Status: DC | PRN

## 2018-09-08 MED ORDER — MONTELUKAST SODIUM 10 MG PO TABS: 10.0000 mg | ORAL_TABLET | Freq: Every day | ORAL | 1 refills | Status: DC

## 2018-09-08 MED ORDER — AMLODIPINE BESYLATE 10 MG PO TABS: 10.0000 mg | ORAL_TABLET | Freq: Every day | ORAL | 1 refills | Status: DC

## 2018-09-08 MED ORDER — CLOPIDOGREL BISULFATE 75 MG PO TABS: 75.0000 mg | ORAL_TABLET | Freq: Every day | ORAL | 3 refills | Status: DC

## 2018-09-08 MED ORDER — BISOPROLOL-HYDROCHLOROTHIAZIDE 5-6.25 MG PO TABS: 1.0000 | ORAL_TABLET | Freq: Every day | ORAL | 2 refills | Status: DC

## 2018-09-08 MED ORDER — GLIMEPIRIDE 1 MG PO TABS: 1.0000 mg | ORAL_TABLET | Freq: Every day | ORAL | 2 refills | Status: DC

## 2018-09-08 MED ORDER — OXYBUTYNIN CHLORIDE 5 MG PO TABS: 5.0000 mg | ORAL_TABLET | Freq: Every day | ORAL | 1 refills | Status: DC

## 2018-09-08 MED ORDER — SERTRALINE HCL 100 MG PO TABS: 100.0000 mg | ORAL_TABLET | Freq: Every day | ORAL | 3 refills | Status: DC

## 2018-09-08 NOTE — Progress Notes (Signed)
[facility] [place], [place] [address]  Internal MEDICINE  Office Visit Note  Patient Name: Felicia Woods  [id number]  [id number]  Date of Service: 09/08/2018  Chief Complaint  Patient presents with  . Diabetes  . Hyperlipidemia  . Hypertension    HPI Pt is here for follow up on DM, HTN, and HLD.  Overall she is doing well.  She does complain that she is working a lot and helping her daughter with her newborn twins.  She is sleeping ok, just not enough. She denies any issues with her blood pressure or her sugar. She denies any other need a this time.     Current Medication: Outpatient Encounter Medications as of 09/08/2018  Medication Sig  . ACCU-CHEK SOFTCLIX LANCETS lancets Use as instructed once  DAILY DIAG -E11.9  . albuterol (PROVENTIL HFA;VENTOLIN HFA) 108 (90 Base) MCG/ACT inhaler Inhale 2 puffs into the lungs every 6 (six) hours as needed for wheezing or shortness of breath.  Marland Kitchen amLODipine (NORVASC) 10 MG tablet Take 1 tablet (10 mg total) by mouth daily.  Marland Kitchen aspirin EC 81 MG tablet Take 81 mg by mouth daily.  Marland Kitchen atorvastatin (LIPITOR) 20 MG tablet Take 1 tablet (20 mg total) by mouth daily.  . bisoprolol-hydrochlorothiazide (ZIAC) 5-6.25 MG tablet Take 1 tablet by mouth daily.  . budesonide-formoterol (SYMBICORT) 160-4.5 MCG/ACT inhaler Inhale 2 puffs into the lungs 2 (two) times daily.  . clopidogrel (PLAVIX) 75 MG tablet Take 1 tablet (75 mg total) by mouth daily.  . cyclobenzaprine (FLEXERIL) 10 MG tablet Take 1 tablet (10 mg total) by mouth 3 (three) times daily as needed for muscle spasms.  . Dulaglutide (TRULICITY) 8.89 VQ/9.4HW SOPN Inject 0.75 mg into the skin once a week.  . esomeprazole (NEXIUM) 40 MG capsule Take 1 capsule (40 mg total) by mouth daily.  Marland Kitchen estradiol (ESTRACE) 0.5 MG tablet Take 1 tablet (0.5 mg total) by mouth daily.  . furosemide (LASIX) 40 MG tablet Take 1 tablet (40 mg total) by mouth 2 (two) times daily.  Marland Kitchen glimepiride  (AMARYL) 1 MG tablet Take 1 tablet (1 mg total) by mouth daily with breakfast.  . glucose blood (ACCU-CHEK AVIVA PLUS) test strip 1 each by Other route as needed for other. Use as instructed once  DAILY DIAG -E11.9  . losartan (COZAAR) 100 MG tablet Take one tab po qd  . montelukast (SINGULAIR) 10 MG tablet Take 1 tablet (10 mg total) by mouth daily.  . nitroGLYCERIN (NITROSTAT) 0.4 MG SL tablet Place 0.4 mg under the tongue every 5 (five) minutes x 3 doses as needed for chest pain. *If no relief call MD or go to Emergency Room*  . oxybutynin (DITROPAN) 5 MG tablet Take 1 tablet (5 mg total) by mouth daily after supper.  . sertraline (ZOLOFT) 100 MG tablet Take 1 tablet (100 mg total) by mouth daily.  Marland Kitchen zolpidem (AMBIEN) 10 MG tablet Take 1 tablet (10 mg total) by mouth at bedtime.  . [DISCONTINUED] amLODipine (NORVASC) 10 MG tablet Take 1 tablet (10 mg total) by mouth daily.  . [DISCONTINUED] atorvastatin (LIPITOR) 20 MG tablet Take 1 tablet (20 mg total) by mouth daily.  . [DISCONTINUED] bisoprolol-hydrochlorothiazide (ZIAC) 5-6.25 MG tablet Take 1 tablet by mouth daily.  . [DISCONTINUED] clopidogrel (PLAVIX) 75 MG tablet Take 1 tablet (75 mg total) by mouth daily.  . [DISCONTINUED] cyclobenzaprine (FLEXERIL) 10 MG tablet Take 1 tablet (10 mg total) by mouth 3 (three) times daily as needed  for muscle spasms.  . [DISCONTINUED] Dulaglutide (TRULICITY) 1.50 CH/3.6CB SOPN Inject 0.75 mg into the skin once a week.  . [DISCONTINUED] estradiol (ESTRACE) 0.5 MG tablet Take 1 tablet (0.5 mg total) by mouth daily.  . [DISCONTINUED] furosemide (LASIX) 40 MG tablet Take 1 tablet (40 mg total) by mouth 2 (two) times daily.  . [DISCONTINUED] glimepiride (AMARYL) 1 MG tablet Take 1 mg by mouth daily with breakfast.  . [DISCONTINUED] losartan (COZAAR) 100 MG tablet Take one tab po qd  . [DISCONTINUED] montelukast (SINGULAIR) 10 MG tablet Take 1 tablet (10 mg total) by mouth daily.  . [DISCONTINUED] oxybutynin  (DITROPAN) 5 MG tablet Take 1 tablet (5 mg total) by mouth daily after supper.  . [DISCONTINUED] sertraline (ZOLOFT) 100 MG tablet Take 1 tablet (100 mg total) by mouth daily.  . [DISCONTINUED] zolpidem (AMBIEN) 10 MG tablet Take 1 tablet (10 mg total) by mouth at bedtime.  . [DISCONTINUED] azithromycin (ZITHROMAX) 250 MG tablet Take as directed. (Patient not taking: Reported on 09/08/2018)   No facility-administered encounter medications on file as of 09/08/2018.     Surgical History: Past Surgical History:  Procedure Laterality Date  . ABDOMINAL HYSTERECTOMY    . CARDIAC CATHETERIZATION    . CARDIAC CATHETERIZATION N/A 10/18/2015   Procedure: Left Heart Cath and Cors/Grafts Angiography;  Surgeon: Lorretta Harp, MD;  Location: [facility];  Service: Cardiovascular;  Laterality: N/A;  . CARDIAC CATHETERIZATION N/A 10/18/2015   Procedure: Coronary Stent Intervention;  Surgeon: Lorretta Harp, MD;  Location: [facility];  Service: Cardiovascular;  Laterality: N/A;  . CARDIAC SURGERY    . CHOLECYSTECTOMY    . CORONARY ANGIOPLASTY    . CORONARY ARTERY BYPASS GRAFT     4 vessels - 2010  . CORONARY STENT INTERVENTION N/A 02/16/2017   Procedure: CORONARY STENT INTERVENTION;  Surgeon: Yolonda Kida, MD;  Location: [facility];  Service: Cardiovascular;  Laterality: N/A;  . LEFT HEART CATH AND CORONARY ANGIOGRAPHY Left 02/16/2017   Procedure: LEFT HEART CATH AND CORONARY ANGIOGRAPHY;  Surgeon: Corey Skains, MD;  Location: [facility];  Service: Cardiovascular;  Laterality: Left;  . LEFT HEART CATH AND CORS/GRAFTS ANGIOGRAPHY Left 11/23/2017   Procedure: LEFT HEART CATH AND CORS/GRAFTS ANGIOGRAPHY;  Surgeon: Corey Skains, MD;  Location: [facility];  Service: Cardiovascular;  Laterality: Left;    Medical History: Past Medical History:  Diagnosis Date  . Asthma   . Coronary artery disease   . Diabetes mellitus without complication ([place])   .  Hyperlipidemia   . Hypertension   . MI (myocardial infarction) ([place])   . Migraine headache with aura   . 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    Family History: Family History  Problem Relation Age of Onset  . Diabetes Mother   . Diabetes Father   . Cancer Father   . Diabetes Brother     Social History   Socioeconomic History  . Marital status: Single    Spouse name: Not on file  . Number of children: Not on file  . Years of education: Not on file  . Highest education level: Not on file  Occupational History  . Not on file  Social Needs  . Financial resource strain: Not on file  . Food insecurity:    Worry: Not on file    Inability: Not on file  . Transportation needs:    Medical: Not on file  Non-medical: Not on file  Tobacco Use  . Smoking status: Never Smoker  . Smokeless tobacco: Never Used  Substance and Sexual Activity  . Alcohol use: No    Alcohol/week: 0.0 standard drinks  . Drug use: No  . Sexual activity: Yes  Lifestyle  . Physical activity:    Days per week: Not on file    Minutes per session: Not on file  . Stress: Not on file  Relationships  . Social connections:    Talks on phone: Not on file    Gets together: Not on file    Attends religious service: Not on file    Active member of club or organization: Not on file    Attends meetings of clubs or organizations: Not on file    Relationship status: Not on file  . Intimate partner violence:    Fear of current or ex partner: Not on file    Emotionally abused: Not on file    Physically abused: Not on file    Forced sexual activity: Not on file  Other Topics Concern  . Not on file  Social History Narrative  . Not on file      Review of Systems  Constitutional: Negative for chills, fatigue and unexpected weight change.  HENT: Negative for congestion, rhinorrhea, sneezing and sore throat.   Eyes: Negative for photophobia, pain and redness.  Respiratory: Negative for cough, chest tightness  and shortness of breath.   Cardiovascular: Negative for chest pain and palpitations.  Gastrointestinal: Negative for abdominal pain, constipation, diarrhea, nausea and vomiting.  Endocrine: Negative.   Genitourinary: Negative for dysuria and frequency.  Musculoskeletal: Negative for arthralgias, back pain, joint swelling and neck pain.  Skin: Negative for rash.  Allergic/Immunologic: Negative.   Neurological: Negative for tremors and numbness.  Hematological: Negative for adenopathy. Does not bruise/bleed easily.  Psychiatric/Behavioral: Negative for behavioral problems and sleep disturbance. The patient is not nervous/anxious.     Vital Signs: BP 132/78   Pulse 76   Resp 16   Ht '5\' 7"'  (1.702 m)   Wt 261 lb (118.4 kg)   SpO2 95%   BMI 40.88 kg/m    Physical Exam Vitals signs and nursing note reviewed.  Constitutional:      General: She is not in acute distress.    Appearance: She is well-developed. She is not diaphoretic.  HENT:     Head: Normocephalic and atraumatic.     Mouth/Throat:     Pharynx: No oropharyngeal exudate.  Eyes:     Pupils: Pupils are equal, round, and reactive to light.  Neck:     Musculoskeletal: Normal range of motion and neck supple.     Thyroid: No thyromegaly.     Vascular: No JVD.     Trachea: No tracheal deviation.  Cardiovascular:     Rate and Rhythm: Normal rate and regular rhythm.     Heart sounds: Normal heart sounds. No murmur. No friction rub. No gallop.   Pulmonary:     Effort: Pulmonary effort is normal. No respiratory distress.     Breath sounds: Normal breath sounds. No wheezing or rales.  Chest:     Chest wall: No tenderness.  Abdominal:     Palpations: Abdomen is soft.     Tenderness: There is no abdominal tenderness. There is no guarding.  Musculoskeletal: Normal range of motion.  Lymphadenopathy:     Cervical: No cervical adenopathy.  Skin:    General: Skin is warm and dry.  Neurological:     Mental Status: She is alert  and oriented to person, place, and time.     Cranial Nerves: No cranial nerve deficit.  Psychiatric:        Behavior: Behavior normal.        Thought Content: Thought content normal.        Judgment: Judgment normal.    Assessment/Plan: 1. Uncontrolled type 2 diabetes mellitus with hyperglycemia ([place]) Patient's diabetes well controlled this time.  A1c today is 6.4.  Refilled patient's Amaryl and Trulicity at this visit. - POCT HgB A1C - glimepiride (AMARYL) 1 MG tablet; Take 1 tablet (1 mg total) by mouth daily with breakfast.  Dispense: 90 tablet; Refill: 2 - Dulaglutide (TRULICITY) 8.29 HB/7.1IR SOPN; Inject 0.75 mg into the skin once a week.  Dispense: 4 pen; Refill: 2  2. Essential hypertension Stable, continue current medications as prescribed.  Pts BP today 132/78.  - bisoprolol-hydrochlorothiazide (ZIAC) 5-6.25 MG tablet; Take 1 tablet by mouth daily.  Dispense: 90 tablet; Refill: 2 - amLODipine (NORVASC) 10 MG tablet; Take 1 tablet (10 mg total) by mouth daily.  Dispense: 90 tablet; Refill: 1 - furosemide (LASIX) 40 MG tablet; Take 1 tablet (40 mg total) by mouth 2 (two) times daily.  Dispense: 180 tablet; Refill: 1 - losartan (COZAAR) 100 MG tablet; Take one tab po qd  Dispense: 90 tablet; Refill: 3  3. Seasonal allergies Refilled Singulair.  Pt continues to report good results at this time.  - montelukast (SINGULAIR) 10 MG tablet; Take 1 tablet (10 mg total) by mouth daily.  Dispense: 90 tablet; Refill: 1  4. Acute bilateral low back pain without sciatica Refilled patients Flexeril for low back pain/spasm.  - cyclobenzaprine (FLEXERIL) 10 MG tablet; Take 1 tablet (10 mg total) by mouth 3 (three) times daily as needed for muscle spasms.  Dispense: 30 tablet; Refill: 0  5. OAB (overactive bladder) Refilled Ditropan for OAB. - oxybutynin (DITROPAN) 5 MG tablet; Take 1 tablet (5 mg total) by mouth daily after supper.  Dispense: 90 tablet; Refill: 1  6. Hot flashes due to  menopause Refilled Estrace for hot flashes.  - estradiol (ESTRACE) 0.5 MG tablet; Take 1 tablet (0.5 mg total) by mouth daily.  Dispense: 90 tablet; Refill: 1  7. S/P drug eluting coronary stent placement Refilled patients Plavix.  - clopidogrel (PLAVIX) 75 MG tablet; Take 1 tablet (75 mg total) by mouth daily.  Dispense: 90 tablet; Refill: 3  8. Other insomnia Refilled patients Ambien, she reports good results when using.  - zolpidem (AMBIEN) 10 MG tablet; Take 1 tablet (10 mg total) by mouth at bedtime.  Dispense: 90 tablet; Refill: 1  9. Depression, major, single episode, in partial remission ([place]) Refilled patients zoloft. - sertraline (ZOLOFT) 100 MG tablet; Take 1 tablet (100 mg total) by mouth daily.  Dispense: 90 tablet; Refill: 3  10. Mixed hyperlipidemia Refilled patients Lipitor - atorvastatin (LIPITOR) 20 MG tablet; Take 1 tablet (20 mg total) by mouth daily.  Dispense: 90 tablet; Refill: 3  General Counseling: Felicia Woods verbalizes understanding of the findings of todays visit and agrees with plan of treatment. I have discussed any further diagnostic evaluation that may be needed or ordered today. We also reviewed her medications today. she has been encouraged to call the office with any questions or concerns that should arise related to todays visit.    Orders Placed This Encounter  Procedures  . POCT HgB A1C    Meds ordered this encounter  Medications  . bisoprolol-hydrochlorothiazide (ZIAC) 5-6.25 MG tablet    Sig: Take 1 tablet by mouth daily.    Dispense:  90 tablet    Refill:  2  . amLODipine (NORVASC) 10 MG tablet    Sig: Take 1 tablet (10 mg total) by mouth daily.    Dispense:  90 tablet    Refill:  1  . glimepiride (AMARYL) 1 MG tablet    Sig: Take 1 tablet (1 mg total) by mouth daily with breakfast.    Dispense:  90 tablet    Refill:  2  . furosemide (LASIX) 40 MG tablet    Sig: Take 1 tablet (40 mg total) by mouth 2 (two) times daily.    Dispense:  180  tablet    Refill:  1  . estradiol (ESTRACE) 0.5 MG tablet    Sig: Take 1 tablet (0.5 mg total) by mouth daily.    Dispense:  90 tablet    Refill:  1  . montelukast (SINGULAIR) 10 MG tablet    Sig: Take 1 tablet (10 mg total) by mouth daily.    Dispense:  90 tablet    Refill:  1  . sertraline (ZOLOFT) 100 MG tablet    Sig: Take 1 tablet (100 mg total) by mouth daily.    Dispense:  90 tablet    Refill:  3  . clopidogrel (PLAVIX) 75 MG tablet    Sig: Take 1 tablet (75 mg total) by mouth daily.    Dispense:  90 tablet    Refill:  3  . zolpidem (AMBIEN) 10 MG tablet    Sig: Take 1 tablet (10 mg total) by mouth at bedtime.    Dispense:  90 tablet    Refill:  1  . losartan (COZAAR) 100 MG tablet    Sig: Take one tab po qd    Dispense:  90 tablet    Refill:  3  . atorvastatin (LIPITOR) 20 MG tablet    Sig: Take 1 tablet (20 mg total) by mouth daily.    Dispense:  90 tablet    Refill:  3  . cyclobenzaprine (FLEXERIL) 10 MG tablet    Sig: Take 1 tablet (10 mg total) by mouth 3 (three) times daily as needed for muscle spasms.    Dispense:  30 tablet    Refill:  0  . oxybutynin (DITROPAN) 5 MG tablet    Sig: Take 1 tablet (5 mg total) by mouth daily after supper.    Dispense:  90 tablet    Refill:  1  . Dulaglutide (TRULICITY) 6.81 LX/7.2IO SOPN    Sig: Inject 0.75 mg into the skin once a week.    Dispense:  4 pen    Refill:  2    Time spent: 25 Minutes   This patient was seen by Orson Gear AGNP-C in Collaboration with Dr Lavera Guise as a part of collaborative care agreement     Kendell Bane AGNP-C Internal medicine

## 2018-10-09 ENCOUNTER — Other Ambulatory Visit: Payer: Self-pay

## 2018-10-09 DIAGNOSIS — E1165 Type 2 diabetes mellitus with hyperglycemia: Secondary | ICD-10-CM

## 2018-10-09 MED ORDER — DULAGLUTIDE 0.75 MG/0.5ML ~~LOC~~ SOAJ: 0.7500 mg | SUBCUTANEOUS | 2 refills | Status: DC

## 2018-10-12 ENCOUNTER — Other Ambulatory Visit: Payer: Self-pay

## 2018-10-12 DIAGNOSIS — E1165 Type 2 diabetes mellitus with hyperglycemia: Secondary | ICD-10-CM

## 2018-10-12 MED ORDER — DULAGLUTIDE 0.75 MG/0.5ML ~~LOC~~ SOAJ: 0.7500 mg | SUBCUTANEOUS | 2 refills | Status: DC

## 2018-11-30 DIAGNOSIS — M722 Plantar fascial fibromatosis: Secondary | ICD-10-CM | POA: Diagnosis not present

## 2018-11-30 DIAGNOSIS — M898X9 Other specified disorders of bone, unspecified site: Secondary | ICD-10-CM | POA: Diagnosis not present

## 2018-12-08 DIAGNOSIS — M5416 Radiculopathy, lumbar region: Secondary | ICD-10-CM | POA: Diagnosis not present

## 2018-12-08 DIAGNOSIS — M4696 Unspecified inflammatory spondylopathy, lumbar region: Secondary | ICD-10-CM | POA: Diagnosis not present

## 2018-12-11 ENCOUNTER — Ambulatory Visit: Payer: Self-pay | Admitting: Adult Health

## 2018-12-14 ENCOUNTER — Ambulatory Visit: Payer: Self-pay | Admitting: Adult Health

## 2018-12-21 DIAGNOSIS — I25110 Atherosclerotic heart disease of native coronary artery with unstable angina pectoris: Secondary | ICD-10-CM | POA: Diagnosis not present

## 2018-12-21 DIAGNOSIS — I25810 Atherosclerosis of coronary artery bypass graft(s) without angina pectoris: Secondary | ICD-10-CM | POA: Diagnosis not present

## 2018-12-21 DIAGNOSIS — I5032 Chronic diastolic (congestive) heart failure: Secondary | ICD-10-CM | POA: Diagnosis not present

## 2018-12-21 DIAGNOSIS — E782 Mixed hyperlipidemia: Secondary | ICD-10-CM | POA: Diagnosis not present

## 2018-12-21 DIAGNOSIS — I10 Essential (primary) hypertension: Secondary | ICD-10-CM | POA: Diagnosis not present

## 2018-12-21 DIAGNOSIS — M722 Plantar fascial fibromatosis: Secondary | ICD-10-CM | POA: Diagnosis not present

## 2018-12-21 DIAGNOSIS — I2119 ST elevation (STEMI) myocardial infarction involving other coronary artery of inferior wall: Secondary | ICD-10-CM | POA: Diagnosis not present

## 2019-01-11 DIAGNOSIS — I25110 Atherosclerotic heart disease of native coronary artery with unstable angina pectoris: Secondary | ICD-10-CM | POA: Diagnosis not present

## 2019-01-18 ENCOUNTER — Other Ambulatory Visit: Payer: Self-pay

## 2019-01-18 ENCOUNTER — Ambulatory Visit (INDEPENDENT_AMBULATORY_CARE_PROVIDER_SITE_OTHER): Payer: Medicare Other | Admitting: Adult Health

## 2019-01-18 ENCOUNTER — Encounter: Payer: Self-pay | Admitting: Adult Health

## 2019-01-18 VITALS — BP 136/88 | HR 80 | Resp 16 | Ht 67.0 in | Wt 253.0 lb

## 2019-01-18 DIAGNOSIS — K219 Gastro-esophageal reflux disease without esophagitis: Secondary | ICD-10-CM | POA: Diagnosis not present

## 2019-01-18 DIAGNOSIS — I10 Essential (primary) hypertension: Secondary | ICD-10-CM | POA: Diagnosis not present

## 2019-01-18 DIAGNOSIS — M545 Low back pain, unspecified: Secondary | ICD-10-CM

## 2019-01-18 DIAGNOSIS — E1165 Type 2 diabetes mellitus with hyperglycemia: Secondary | ICD-10-CM | POA: Diagnosis not present

## 2019-01-18 DIAGNOSIS — E782 Mixed hyperlipidemia: Secondary | ICD-10-CM

## 2019-01-18 DIAGNOSIS — R0602 Shortness of breath: Secondary | ICD-10-CM

## 2019-01-18 LAB — POCT GLYCOSYLATED HEMOGLOBIN (HGB A1C): Hemoglobin A1C: 6.6 % — AB (ref 4.0–5.6)

## 2019-01-18 MED ORDER — CYCLOBENZAPRINE HCL 10 MG PO TABS: 10.0000 mg | ORAL_TABLET | Freq: Three times a day (TID) | ORAL | 1 refills | Status: DC | PRN

## 2019-01-18 MED ORDER — TRULICITY 1.5 MG/0.5ML ~~LOC~~ SOAJ: 1.5000 mg | SUBCUTANEOUS | 2 refills | Status: DC

## 2019-01-18 NOTE — Progress Notes (Signed)
[facility] [place], [place] [address]  Internal MEDICINE  Office Visit Note  Patient Name: Felicia Woods  [id number]  [id number]  Date of Service: 01/18/2019  Chief Complaint  Patient presents with  . Diabetes    6 month follow up   . Hyperlipidemia  . Hypertension  . Gastroesophageal Reflux    HPI  Pt is here for follow up on DM, HLD, HTN and GERD.  Pts A1C today is 6.6.  Her blood pressure is 136/88 and well controlled. Her HLD and gerd are also well controlled.  PT does complain of intermittent low back pain.  She reports his pain makes it difficult for her to sleep at times something she has dealt with for quite a while.      Current Medication: Outpatient Encounter Medications as of 01/18/2019  Medication Sig  . ACCU-CHEK SOFTCLIX LANCETS lancets Use as instructed once  DAILY DIAG -E11.9  . albuterol (PROVENTIL HFA;VENTOLIN HFA) 108 (90 Base) MCG/ACT inhaler Inhale 2 puffs into the lungs every 6 (six) hours as needed for wheezing or shortness of breath.  Marland Kitchen amLODipine (NORVASC) 10 MG tablet Take 1 tablet (10 mg total) by mouth daily.  Marland Kitchen aspirin EC 81 MG tablet Take 81 mg by mouth daily.  Marland Kitchen atorvastatin (LIPITOR) 20 MG tablet Take 1 tablet (20 mg total) by mouth daily.  . bisoprolol-hydrochlorothiazide (ZIAC) 5-6.25 MG tablet Take 1 tablet by mouth daily.  . budesonide-formoterol (SYMBICORT) 160-4.5 MCG/ACT inhaler Inhale 2 puffs into the lungs 2 (two) times daily.  . clopidogrel (PLAVIX) 75 MG tablet Take 1 tablet (75 mg total) by mouth daily.  . cyclobenzaprine (FLEXERIL) 10 MG tablet Take 1 tablet (10 mg total) by mouth 3 (three) times daily as needed for muscle spasms.  . Dulaglutide (TRULICITY) 8.29 HB/7.1IR SOPN Inject 0.75 mg into the skin once a week. 90 days  . esomeprazole (NEXIUM) 40 MG capsule Take 1 capsule (40 mg total) by mouth daily.  Marland Kitchen estradiol (ESTRACE) 0.5 MG tablet Take 1 tablet (0.5 mg total) by mouth daily.  . furosemide  (LASIX) 40 MG tablet Take 1 tablet (40 mg total) by mouth 2 (two) times daily.  Marland Kitchen glimepiride (AMARYL) 1 MG tablet Take 1 tablet (1 mg total) by mouth daily with breakfast.  . glucose blood (ACCU-CHEK AVIVA PLUS) test strip 1 each by Other route as needed for other. Use as instructed once  DAILY DIAG -E11.9  . losartan (COZAAR) 100 MG tablet Take one tab po qd  . montelukast (SINGULAIR) 10 MG tablet Take 1 tablet (10 mg total) by mouth daily.  . nitroGLYCERIN (NITROSTAT) 0.4 MG SL tablet Place 0.4 mg under the tongue every 5 (five) minutes x 3 doses as needed for chest pain. *If no relief call MD or go to Emergency Room*  . oxybutynin (DITROPAN) 5 MG tablet Take 1 tablet (5 mg total) by mouth daily after supper.  . sertraline (ZOLOFT) 100 MG tablet Take 1 tablet (100 mg total) by mouth daily.  Marland Kitchen zolpidem (AMBIEN) 10 MG tablet Take 1 tablet (10 mg total) by mouth at bedtime.   No facility-administered encounter medications on file as of 01/18/2019.     Surgical History: Past Surgical History:  Procedure Laterality Date  . ABDOMINAL HYSTERECTOMY    . CARDIAC CATHETERIZATION    . CARDIAC CATHETERIZATION N/A 10/18/2015   Procedure: Left Heart Cath and Cors/Grafts Angiography;  Surgeon: Lorretta Harp, MD;  Location: [place] [facility];  Service: Cardiovascular;  Laterality: N/A;  . CARDIAC CATHETERIZATION N/A 10/18/2015   Procedure: Coronary Stent Intervention;  Surgeon: Lorretta Harp, MD;  Location: [place] [facility];  Service: Cardiovascular;  Laterality: N/A;  . CARDIAC SURGERY    . CHOLECYSTECTOMY    . CORONARY ANGIOPLASTY    . CORONARY ARTERY BYPASS GRAFT     4 vessels - 2010  . CORONARY STENT INTERVENTION N/A 02/16/2017   Procedure: CORONARY STENT INTERVENTION;  Surgeon: Yolonda Kida, MD;  Location: [place] [facility];  Service: Cardiovascular;  Laterality: N/A;  . LEFT HEART CATH AND CORONARY ANGIOGRAPHY Left 02/16/2017   Procedure: LEFT HEART CATH AND CORONARY ANGIOGRAPHY;   Surgeon: Corey Skains, MD;  Location: [place] [facility];  Service: Cardiovascular;  Laterality: Left;  . LEFT HEART CATH AND CORS/GRAFTS ANGIOGRAPHY Left 11/23/2017   Procedure: LEFT HEART CATH AND CORS/GRAFTS ANGIOGRAPHY;  Surgeon: Corey Skains, MD;  Location: [place] [facility];  Service: Cardiovascular;  Laterality: Left;    Medical History: Past Medical History:  Diagnosis Date  . Asthma   . Coronary artery disease   . Diabetes mellitus without complication ([place])   . Hyperlipidemia   . Hypertension   . MI (myocardial infarction) ([place])   . Migraine headache with aura   . 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    Family History: Family History  Problem Relation Age of Onset  . Diabetes Mother   . Diabetes Father   . Cancer Father   . Diabetes Brother     Social History   Socioeconomic History  . Marital status: Single    Spouse name: Not on file  . Number of children: Not on file  . Years of education: Not on file  . Highest education level: Not on file  Occupational History  . Not on file  Social Needs  . Financial resource strain: Not on file  . Food insecurity    Worry: Not on file    Inability: Not on file  . Transportation needs    Medical: Not on file    Non-medical: Not on file  Tobacco Use  . Smoking status: Never Smoker  . Smokeless tobacco: Never Used  Substance and Sexual Activity  . Alcohol use: No    Alcohol/week: 0.0 standard drinks  . Drug use: No  . Sexual activity: Yes  Lifestyle  . Physical activity    Days per week: Not on file    Minutes per session: Not on file  . Stress: Not on file  Relationships  . Social Herbalist on phone: Not on file    Gets together: Not on file    Attends religious service: Not on file    Active member of club or organization: Not on file    Attends meetings of clubs or organizations: Not on file    Relationship status: Not on file  . Intimate partner violence    Fear of current or  ex partner: Not on file    Emotionally abused: Not on file    Physically abused: Not on file    Forced sexual activity: Not on file  Other Topics Concern  . Not on file  Social History Narrative  . Not on file      Review of Systems  Constitutional: Negative for chills, fatigue and unexpected weight change.  HENT: Negative for congestion, rhinorrhea, sneezing and sore throat.   Eyes: Negative for photophobia, pain and redness.  Respiratory: Negative  for cough, chest tightness and shortness of breath.   Cardiovascular: Negative for chest pain and palpitations.  Gastrointestinal: Negative for abdominal pain, constipation, diarrhea, nausea and vomiting.  Endocrine: Negative.   Genitourinary: Negative for dysuria and frequency.  Musculoskeletal: Negative for arthralgias, back pain, joint swelling and neck pain.  Skin: Negative for rash.  Allergic/Immunologic: Negative.   Neurological: Negative for tremors and numbness.  Hematological: Negative for adenopathy. Does not bruise/bleed easily.  Psychiatric/Behavioral: Negative for behavioral problems and sleep disturbance. The patient is not nervous/anxious.     Vital Signs: BP 136/88   Pulse 80   Resp 16   Ht _0  (1.702 m)   Wt 253 lb (114.8 kg)   SpO2 97%   BMI 39.63 kg/m    Physical Exam Vitals signs and nursing note reviewed.  Constitutional:      General: She is not in acute distress.    Appearance: She is well-developed. She is not diaphoretic.  HENT:     Head: Normocephalic and atraumatic.     Mouth/Throat:     Pharynx: No oropharyngeal exudate.  Eyes:     Pupils: Pupils are equal, round, and reactive to light.  Neck:     Musculoskeletal: Normal range of motion and neck supple.     Thyroid: No thyromegaly.     Vascular: No JVD.     Trachea: No tracheal deviation.  Cardiovascular:     Rate and Rhythm: Normal rate and regular rhythm.     Heart sounds: Normal heart sounds. No murmur. No friction rub. No gallop.    Pulmonary:     Effort: Pulmonary effort is normal. No respiratory distress.     Breath sounds: Normal breath sounds. No wheezing or rales.  Chest:     Chest wall: No tenderness.  Abdominal:     Palpations: Abdomen is soft.     Tenderness: There is no abdominal tenderness. There is no guarding.  Musculoskeletal: Normal range of motion.  Lymphadenopathy:     Cervical: No cervical adenopathy.  Skin:    General: Skin is warm and dry.  Neurological:     Mental Status: She is alert and oriented to person, place, and time.     Cranial Nerves: No cranial nerve deficit.  Psychiatric:        Behavior: Behavior normal.        Thought Content: Thought content normal.        Judgment: Judgment normal.     Assessment/Plan: 1. Uncontrolled type 2 diabetes mellitus with hyperglycemia (HCC) Continue Trulicity as prescribed - POCT HgB A1C - Dulaglutide (TRULICITY) 1.5 WP/7.9YI SOPN; Inject 1.5 mg into the skin once a week.  Dispense: 12 pen; Refill: 2  2. Essential hypertension Stable, continue current medications as prescribed.  3. Acute bilateral low back pain without sciatica Take Flexeril as prescribed.  Use heat 20 minutes on and 2 hours off.  Also take anti-inflammatories as discussed.  Return to clinic if symptoms fail to improve. - cyclobenzaprine (FLEXERIL) 10 MG tablet; Take 1 tablet (10 mg total) by mouth 3 (three) times daily as needed for muscle spasms.  Dispense: 30 tablet; Refill: 1  4. Mixed hyperlipidemia Stable, continue current medications lipid will be drawn at next blood draw  5. Gastroesophageal reflux disease without esophagitis Stable, continue medication as prescribed.  General Counseling: Kavina verbalizes understanding of the findings of todays visit and agrees with plan of treatment. I have discussed any further diagnostic evaluation that may be needed or ordered  today. We also reviewed her medications today. she has been encouraged to call the office with any  questions or concerns that should arise related to todays visit.    Orders Placed This Encounter  Procedures  . POCT HgB A1C    No orders of the defined types were placed in this encounter.   Time spent: 25 Minutes   This patient was seen by Orson Gear AGNP-C in Collaboration with Dr Lavera Guise as a part of collaborative care agreement     Kendell Bane AGNP-C Internal medicine

## 2019-01-30 ENCOUNTER — Other Ambulatory Visit: Payer: Self-pay | Admitting: Student

## 2019-01-30 DIAGNOSIS — M5136 Other intervertebral disc degeneration, lumbar region: Secondary | ICD-10-CM | POA: Diagnosis not present

## 2019-01-30 DIAGNOSIS — M5416 Radiculopathy, lumbar region: Secondary | ICD-10-CM | POA: Diagnosis not present

## 2019-02-02 DIAGNOSIS — Z01818 Encounter for other preprocedural examination: Secondary | ICD-10-CM | POA: Diagnosis not present

## 2019-02-02 DIAGNOSIS — I25700 Atherosclerosis of coronary artery bypass graft(s), unspecified, with unstable angina pectoris: Secondary | ICD-10-CM | POA: Diagnosis not present

## 2019-02-06 ENCOUNTER — Encounter: Payer: Self-pay | Admitting: Adult Health

## 2019-02-09 ENCOUNTER — Other Ambulatory Visit: Payer: Self-pay

## 2019-02-09 ENCOUNTER — Other Ambulatory Visit
Admission: RE | Admit: 2019-02-09 | Discharge: 2019-02-09 | Disposition: A | Discharge status: Home / Self Care | Payer: Medicare Other | Source: Ambulatory Visit | Attending: Internal Medicine | Admitting: Internal Medicine

## 2019-02-09 DIAGNOSIS — Z01812 Encounter for preprocedural laboratory examination: Secondary | ICD-10-CM | POA: Insufficient documentation

## 2019-02-09 DIAGNOSIS — Z20828 Contact with and (suspected) exposure to other viral communicable diseases: Secondary | ICD-10-CM | POA: Diagnosis not present

## 2019-02-10 LAB — SARS CORONAVIRUS 2 (TAT 6-24 HRS): SARS Coronavirus 2: NEGATIVE

## 2019-02-13 ENCOUNTER — Encounter
Admission: RE | Disposition: A | Discharge status: Home / Self Care | Payer: Self-pay | Source: Home / Self Care | Attending: Internal Medicine

## 2019-02-13 ENCOUNTER — Ambulatory Visit
Admission: RE | Admit: 2019-02-13 | Discharge: 2019-02-14 | Disposition: A | Discharge status: Home / Self Care | Payer: Medicare Other | Attending: Internal Medicine | Admitting: Internal Medicine

## 2019-02-13 ENCOUNTER — Other Ambulatory Visit: Payer: Self-pay

## 2019-02-13 DIAGNOSIS — I25119 Atherosclerotic heart disease of native coronary artery with unspecified angina pectoris: Secondary | ICD-10-CM | POA: Diagnosis not present

## 2019-02-13 DIAGNOSIS — Z955 Presence of coronary angioplasty implant and graft: Secondary | ICD-10-CM | POA: Insufficient documentation

## 2019-02-13 DIAGNOSIS — Z951 Presence of aortocoronary bypass graft: Secondary | ICD-10-CM | POA: Diagnosis not present

## 2019-02-13 DIAGNOSIS — I25710 Atherosclerosis of autologous vein coronary artery bypass graft(s) with unstable angina pectoris: Secondary | ICD-10-CM

## 2019-02-13 DIAGNOSIS — I200 Unstable angina: Secondary | ICD-10-CM | POA: Diagnosis present

## 2019-02-13 DIAGNOSIS — Z880 Allergy status to penicillin: Secondary | ICD-10-CM | POA: Diagnosis not present

## 2019-02-13 DIAGNOSIS — E119 Type 2 diabetes mellitus without complications: Secondary | ICD-10-CM | POA: Diagnosis not present

## 2019-02-13 DIAGNOSIS — I2510 Atherosclerotic heart disease of native coronary artery without angina pectoris: Secondary | ICD-10-CM | POA: Diagnosis present

## 2019-02-13 DIAGNOSIS — Z79899 Other long term (current) drug therapy: Secondary | ICD-10-CM | POA: Insufficient documentation

## 2019-02-13 DIAGNOSIS — E785 Hyperlipidemia, unspecified: Secondary | ICD-10-CM | POA: Insufficient documentation

## 2019-02-13 DIAGNOSIS — E7800 Pure hypercholesterolemia, unspecified: Secondary | ICD-10-CM | POA: Diagnosis not present

## 2019-02-13 DIAGNOSIS — I10 Essential (primary) hypertension: Secondary | ICD-10-CM | POA: Insufficient documentation

## 2019-02-13 DIAGNOSIS — Z1159 Encounter for screening for other viral diseases: Secondary | ICD-10-CM | POA: Insufficient documentation

## 2019-02-13 DIAGNOSIS — R079 Chest pain, unspecified: Secondary | ICD-10-CM

## 2019-02-13 DIAGNOSIS — I25790 Atherosclerosis of other coronary artery bypass graft(s) with unstable angina pectoris: Secondary | ICD-10-CM | POA: Diagnosis not present

## 2019-02-13 HISTORY — PX: LEFT HEART CATH AND CORS/GRAFTS ANGIOGRAPHY: CATH118250

## 2019-02-13 HISTORY — PX: CORONARY STENT INTERVENTION: CATH118234

## 2019-02-13 LAB — MRSA PCR SCREENING: MRSA by PCR: NEGATIVE

## 2019-02-13 LAB — SARS CORONAVIRUS 2 BY RT PCR (HOSPITAL ORDER, PERFORMED IN ~~LOC~~ HOSPITAL LAB): SARS Coronavirus 2: NEGATIVE

## 2019-02-13 LAB — GLUCOSE, CAPILLARY
Glucose-Capillary: 73 mg/dL (ref 70–99)
Glucose-Capillary: 77 mg/dL (ref 70–99)
Glucose-Capillary: 169 mg/dL — ABNORMAL HIGH (ref 70–99)
Glucose-Capillary: 207 mg/dL — ABNORMAL HIGH (ref 70–99)

## 2019-02-13 LAB — POCT ACTIVATED CLOTTING TIME: Activated Clotting Time: 340 seconds

## 2019-02-13 SURGERY — LEFT HEART CATH AND CORS/GRAFTS ANGIOGRAPHY
Anesthesia: Moderate Sedation

## 2019-02-13 MED ORDER — ONDANSETRON HCL 4 MG/2ML IJ SOLN
INTRAMUSCULAR | Status: DC | PRN
  Administered 2019-02-13: 4 mg via INTRAVENOUS

## 2019-02-13 MED ORDER — NITROGLYCERIN 1 MG/10 ML FOR IR/CATH LAB
INTRA_ARTERIAL | Status: DC | PRN
  Administered 2019-02-13: 200 ug via INTRACORONARY

## 2019-02-13 MED ORDER — ATROPINE SULFATE 1 MG/10ML IJ SOSY
PREFILLED_SYRINGE | INTRAMUSCULAR | Status: AC
  Filled 2019-02-13: qty 10

## 2019-02-13 MED ORDER — ASPIRIN EC 81 MG PO TBEC
81.0000 mg | DELAYED_RELEASE_TABLET | Freq: Every day | ORAL | Status: DC
  Administered 2019-02-14: 81 mg via ORAL
  Filled 2019-02-13: qty 1

## 2019-02-13 MED ORDER — ADENOSINE (DIAGNOSTIC) FOR INTRACORONARY USE
INTRAVENOUS | Status: DC | PRN
  Administered 2019-02-13 (×2): 48 ug via INTRACORONARY

## 2019-02-13 MED ORDER — NITROGLYCERIN 0.4 MG SL SUBL: 0.4000 mg | SUBLINGUAL_TABLET | SUBLINGUAL | Status: DC | PRN

## 2019-02-13 MED ORDER — HYDRALAZINE HCL 20 MG/ML IJ SOLN
INTRAMUSCULAR | Status: DC | PRN
  Administered 2019-02-13: 10 mg via INTRAVENOUS

## 2019-02-13 MED ORDER — SODIUM CHLORIDE 0.9% FLUSH: 3.0000 mL | INTRAVENOUS | Status: DC | PRN

## 2019-02-13 MED ORDER — NITROGLYCERIN IN D5W 200-5 MCG/ML-% IV SOLN
INTRAVENOUS | Status: DC | PRN
  Administered 2019-02-13: 10 ug/min via INTRAVENOUS

## 2019-02-13 MED ORDER — HEPARIN (PORCINE) IN NACL 1000-0.9 UT/500ML-% IV SOLN
INTRAVENOUS | Status: DC | PRN
  Administered 2019-02-13: 500 mL

## 2019-02-13 MED ORDER — CLOPIDOGREL BISULFATE 75 MG PO TABS
ORAL_TABLET | ORAL | Status: DC | PRN
  Administered 2019-02-13: 600 mg via ORAL

## 2019-02-13 MED ORDER — MORPHINE SULFATE (PF) 4 MG/ML IV SOLN
INTRAVENOUS | Status: DC | PRN
  Administered 2019-02-13: 2 mg via INTRAVENOUS

## 2019-02-13 MED ORDER — CHLORHEXIDINE GLUCONATE CLOTH 2 % EX PADS
6.0000 | MEDICATED_PAD | Freq: Every day | CUTANEOUS | Status: DC
  Administered 2019-02-14: 6 via TOPICAL

## 2019-02-13 MED ORDER — MIDAZOLAM HCL 2 MG/2ML IJ SOLN
INTRAMUSCULAR | Status: DC | PRN
  Administered 2019-02-13 (×2): 1 mg via INTRAVENOUS

## 2019-02-13 MED ORDER — BIVALIRUDIN TRIFLUOROACETATE 250 MG IV SOLR
INTRAVENOUS | Status: AC
  Filled 2019-02-13: qty 250

## 2019-02-13 MED ORDER — OXYBUTYNIN CHLORIDE 5 MG PO TABS
5.0000 mg | ORAL_TABLET | Freq: Every day | ORAL | Status: DC
  Filled 2019-02-13: qty 1

## 2019-02-13 MED ORDER — LOSARTAN POTASSIUM 50 MG PO TABS
100.0000 mg | ORAL_TABLET | Freq: Every day | ORAL | Status: DC
  Administered 2019-02-14: 100 mg via ORAL
  Filled 2019-02-13: qty 2

## 2019-02-13 MED ORDER — MIDAZOLAM HCL 2 MG/2ML IJ SOLN
INTRAMUSCULAR | Status: AC
  Filled 2019-02-13: qty 2

## 2019-02-13 MED ORDER — IOHEXOL 300 MG/ML  SOLN
INTRAMUSCULAR | Status: DC | PRN
  Administered 2019-02-13: 19:00:00 195 mL via INTRA_ARTERIAL

## 2019-02-13 MED ORDER — CLOPIDOGREL BISULFATE 75 MG PO TABS
75.0000 mg | ORAL_TABLET | Freq: Every day | ORAL | Status: DC
  Administered 2019-02-14: 75 mg via ORAL
  Filled 2019-02-13: qty 1

## 2019-02-13 MED ORDER — BISOPROLOL-HYDROCHLOROTHIAZIDE 5-6.25 MG PO TABS: 1.0000 | ORAL_TABLET | Freq: Every day | ORAL | Status: DC

## 2019-02-13 MED ORDER — ADENOSINE 6 MG/2ML IV SOLN
INTRAVENOUS | Status: AC
  Filled 2019-02-13: qty 2

## 2019-02-13 MED ORDER — BIVALIRUDIN BOLUS VIA INFUSION - CUPID
INTRAVENOUS | Status: DC | PRN
  Administered 2019-02-13: 18:00:00 61.2 mg via INTRAVENOUS

## 2019-02-13 MED ORDER — CYCLOBENZAPRINE HCL 10 MG PO TABS
10.0000 mg | ORAL_TABLET | Freq: Three times a day (TID) | ORAL | Status: DC | PRN
  Filled 2019-02-13: qty 1

## 2019-02-13 MED ORDER — ISOSORBIDE MONONITRATE ER 30 MG PO TB24
30.0000 mg | ORAL_TABLET | Freq: Every day | ORAL | Status: DC
  Administered 2019-02-14: 30 mg via ORAL
  Filled 2019-02-13: qty 1

## 2019-02-13 MED ORDER — SODIUM CHLORIDE 0.9 % WEIGHT BASED INFUSION: 1.0000 mL/kg/h | INTRAVENOUS | Status: DC

## 2019-02-13 MED ORDER — ONDANSETRON HCL 4 MG/2ML IJ SOLN
INTRAMUSCULAR | Status: AC
  Filled 2019-02-13: qty 2

## 2019-02-13 MED ORDER — HYDROMORPHONE HCL 1 MG/ML IJ SOLN
INTRAMUSCULAR | Status: AC
  Administered 2019-02-13: 0.5 mg via INTRAVENOUS
  Filled 2019-02-13: qty 0.5

## 2019-02-13 MED ORDER — FENTANYL CITRATE (PF) 100 MCG/2ML IJ SOLN
INTRAMUSCULAR | Status: DC | PRN
  Administered 2019-02-13: 50 ug via INTRAVENOUS

## 2019-02-13 MED ORDER — SODIUM CHLORIDE 0.9 % WEIGHT BASED INFUSION
244.8000 mL/h | INTRAVENOUS | Status: DC
  Administered 2019-02-13: 3 mL/kg/h via INTRAVENOUS

## 2019-02-13 MED ORDER — SODIUM CHLORIDE 0.9% FLUSH
3.0000 mL | Freq: Two times a day (BID) | INTRAVENOUS | Status: DC
  Administered 2019-02-14 (×2): 3 mL via INTRAVENOUS

## 2019-02-13 MED ORDER — IOHEXOL 300 MG/ML  SOLN
INTRAMUSCULAR | Status: DC | PRN
  Administered 2019-02-13: 55 mL via INTRA_ARTERIAL

## 2019-02-13 MED ORDER — ACETAMINOPHEN 325 MG PO TABS: 650.0000 mg | ORAL_TABLET | ORAL | Status: DC | PRN

## 2019-02-13 MED ORDER — SERTRALINE HCL 50 MG PO TABS
100.0000 mg | ORAL_TABLET | Freq: Every day | ORAL | Status: DC
  Administered 2019-02-14: 100 mg via ORAL
  Filled 2019-02-13: qty 2

## 2019-02-13 MED ORDER — ASPIRIN 81 MG PO CHEW
81.0000 mg | CHEWABLE_TABLET | ORAL | Status: AC
  Administered 2019-02-13: 81 mg via ORAL

## 2019-02-13 MED ORDER — CLOPIDOGREL BISULFATE 300 MG PO TABS
ORAL_TABLET | ORAL | Status: AC
  Filled 2019-02-13: qty 2

## 2019-02-13 MED ORDER — MIDAZOLAM HCL 2 MG/2ML IJ SOLN
INTRAMUSCULAR | Status: DC | PRN
  Administered 2019-02-13: 1 mg via INTRAVENOUS
  Administered 2019-02-13: 2 mg via INTRAVENOUS

## 2019-02-13 MED ORDER — NITROGLYCERIN 5 MG/ML IV SOLN
INTRAVENOUS | Status: AC
  Filled 2019-02-13: qty 10

## 2019-02-13 MED ORDER — MOMETASONE FURO-FORMOTEROL FUM 200-5 MCG/ACT IN AERO
2.0000 | INHALATION_SPRAY | Freq: Two times a day (BID) | RESPIRATORY_TRACT | Status: DC
  Administered 2019-02-14 (×2): 2 via RESPIRATORY_TRACT
  Filled 2019-02-13: qty 8.8

## 2019-02-13 MED ORDER — FENTANYL CITRATE (PF) 100 MCG/2ML IJ SOLN
INTRAMUSCULAR | Status: DC | PRN
  Administered 2019-02-13 (×2): 25 ug via INTRAVENOUS

## 2019-02-13 MED ORDER — NITROGLYCERIN IN D5W 200-5 MCG/ML-% IV SOLN
0.0000 ug/min | INTRAVENOUS | Status: DC
  Administered 2019-02-13: 10 ug/min via INTRAVENOUS

## 2019-02-13 MED ORDER — SODIUM CHLORIDE 0.9 % IV SOLN
INTRAVENOUS | Status: AC
  Administered 2019-02-13: 75 mL/h via INTRAVENOUS

## 2019-02-13 MED ORDER — ZOLPIDEM TARTRATE 5 MG PO TABS: 5.0000 mg | ORAL_TABLET | Freq: Every evening | ORAL | Status: DC | PRN

## 2019-02-13 MED ORDER — HYDRALAZINE HCL 20 MG/ML IJ SOLN: 10.0000 mg | INTRAMUSCULAR | Status: AC | PRN

## 2019-02-13 MED ORDER — HYDRALAZINE HCL 20 MG/ML IJ SOLN
INTRAMUSCULAR | Status: AC
  Filled 2019-02-13: qty 1

## 2019-02-13 MED ORDER — FENTANYL CITRATE (PF) 100 MCG/2ML IJ SOLN
INTRAMUSCULAR | Status: AC
  Filled 2019-02-13: qty 2

## 2019-02-13 MED ORDER — HEPARIN (PORCINE) IN NACL 1000-0.9 UT/500ML-% IV SOLN
INTRAVENOUS | Status: AC
  Filled 2019-02-13: qty 1000

## 2019-02-13 MED ORDER — ASPIRIN 81 MG PO CHEW
CHEWABLE_TABLET | ORAL | Status: AC
  Filled 2019-02-13: qty 1

## 2019-02-13 MED ORDER — LABETALOL HCL 5 MG/ML IV SOLN: 10.0000 mg | INTRAVENOUS | Status: AC | PRN

## 2019-02-13 MED ORDER — SODIUM CHLORIDE 0.9 % IV SOLN
INTRAVENOUS | Status: DC | PRN
  Administered 2019-02-13 (×2): 1.75 mg/kg/h via INTRAVENOUS

## 2019-02-13 MED ORDER — MORPHINE SULFATE (PF) 4 MG/ML IV SOLN
INTRAVENOUS | Status: AC
  Filled 2019-02-13: qty 1

## 2019-02-13 MED ORDER — SODIUM CHLORIDE 0.9 % IV SOLN: 250.0000 mL | INTRAVENOUS | Status: DC | PRN

## 2019-02-13 MED ORDER — BISOPROLOL-HYDROCHLOROTHIAZIDE 5-6.25 MG PO TABS
1.0000 | ORAL_TABLET | Freq: Every day | ORAL | Status: DC
  Administered 2019-02-14: 1 via ORAL
  Filled 2019-02-13: qty 1

## 2019-02-13 MED ORDER — AMLODIPINE BESYLATE 10 MG PO TABS
10.0000 mg | ORAL_TABLET | Freq: Every day | ORAL | Status: DC
  Administered 2019-02-14: 10 mg via ORAL
  Filled 2019-02-13: qty 1

## 2019-02-13 MED ORDER — MONTELUKAST SODIUM 10 MG PO TABS
10.0000 mg | ORAL_TABLET | Freq: Every day | ORAL | Status: DC
  Administered 2019-02-14: 10 mg via ORAL
  Filled 2019-02-13: qty 1

## 2019-02-13 MED ORDER — ONDANSETRON HCL 4 MG/2ML IJ SOLN
4.0000 mg | Freq: Four times a day (QID) | INTRAMUSCULAR | Status: DC | PRN
  Administered 2019-02-14 (×2): 4 mg via INTRAVENOUS
  Filled 2019-02-13 (×2): qty 2

## 2019-02-13 MED ORDER — ALBUTEROL SULFATE HFA 108 (90 BASE) MCG/ACT IN AERS
2.0000 | INHALATION_SPRAY | Freq: Four times a day (QID) | RESPIRATORY_TRACT | Status: DC | PRN
  Filled 2019-02-13: qty 6.7

## 2019-02-13 MED ORDER — HYDROMORPHONE HCL 1 MG/ML IJ SOLN
0.5000 mg | Freq: Once | INTRAMUSCULAR | Status: AC | PRN
  Administered 2019-02-13: 0.5 mg via INTRAVENOUS

## 2019-02-13 SURGICAL SUPPLY — 25 items
BALLN TREK RX 2.25X12 (BALLOONS) ×3
BALLN ~~LOC~~ EUPHORA RX 3.0X15 (BALLOONS) ×3
BALLN ~~LOC~~ TREK RX 2.75X8 (BALLOONS) ×3
BALLOON TREK RX 2.25X12 (BALLOONS) ×2 IMPLANT
BALLOON ~~LOC~~ EUPHORA RX 3.0X15 (BALLOONS) ×2 IMPLANT
BALLOON ~~LOC~~ TREK RX 2.75X8 (BALLOONS) ×2 IMPLANT
CATH G 6X70X100 MPA SH (CATHETERS) ×3 IMPLANT
CATH INFINITI 5 FR IM (CATHETERS) ×3 IMPLANT
CATH INFINITI 5 FR LCB (CATHETERS) ×3 IMPLANT
CATH INFINITI 5 FR RCB (CATHETERS) ×3 IMPLANT
CATH INFINITI 5FR ANG PIGTAIL (CATHETERS) ×3 IMPLANT
CATH INFINITI 5FR JL4 (CATHETERS) ×3 IMPLANT
CATH INFINITI JR4 5F (CATHETERS) ×3 IMPLANT
DEVICE CLOSURE MYNXGRIP 6/7F (Vascular Products) ×3 IMPLANT
DEVICE INFLAT 30 PLUS (MISCELLANEOUS) ×3 IMPLANT
KIT MANI 3VAL PERCEP (MISCELLANEOUS) ×3 IMPLANT
NEEDLE PERC 18GX7CM (NEEDLE) ×3 IMPLANT
PACK CARDIAC CATH (CUSTOM PROCEDURE TRAY) ×3 IMPLANT
SHEATH AVANTI 5FR X 11CM (SHEATH) ×3 IMPLANT
SHEATH AVANTI 6FR X 11CM (SHEATH) ×3 IMPLANT
STENT RESOLUTE ONYX 2.5X12 (Permanent Stent) ×3 IMPLANT
STENT RESOLUTE ONYX 2.5X22 (Permanent Stent) ×3 IMPLANT
STENT RESOLUTE ONYX 2.75X30 (Permanent Stent) ×3 IMPLANT
WIRE GUIDERIGHT .035X150 (WIRE) ×3 IMPLANT
WIRE RUNTHROUGH .014X180CM (WIRE) ×3 IMPLANT

## 2019-02-13 NOTE — Progress Notes (Signed)
Dr. Saunders Revel at bedside to evaluate pt. C/o chest pain "7" out of 10. Pt. States low back pain "easing" up to "about a 6 now" on scale 1-10. Pt. Relays to MD that her chest pressure was ongoing during and after entire case.

## 2019-02-13 NOTE — Progress Notes (Signed)
[facility]  Date: 02/13/19 Time: 9:22 PM  Patient assessed at ~7:45 PM following PCI in the recovery area.  She noted 7/10 chest pain, which she in retrospect states has been present during most of procedure.  During case, she was moaning in pain and reported 12/10 chest pain.  She currently appears comfortable.  Post-PCI EKG shows NSR with RBBB.  No ischemic changes are evident.  I recommend continued medical therapy with titration of NTG infusion.  Further management per Dr. Nehemiah Massed.  Nelva Bush, MD [facility] Pager: [phone]

## 2019-02-13 NOTE — Progress Notes (Signed)
Pt. C/o "12" out of 1-10 scale of lower back pain. Spoke with Dr. Loletha Grayer. End in person. Orders received. Pt. Med. With Dilaudid 0.5 mg IV now x 1. 12 lead EKG done. Pt. Also c/o CP "7" out of 10. MD made aware. To restart NTG Gtt. Per orders.

## 2019-02-13 NOTE — Care Plan (Signed)
Felicia Woods is a 63 y.o. Female with a PMH of  MI, HTN, Hyperlipidemia, Diabetes Mellitus, Asthma, and migraines who presented to [facility] on 02/13/19 for elective Cardiac Catheterization by Dr. Nehemiah Massed.  She was found to have severe native vessel and graft disease requiring PCI w/ 3 Drug eluting stents to SVG-rPDA  During the procedure and post procedure she was complaining of chest pain.  Post-PCI EKG revealed NSR w/ RBBB and no ischemic changes.  Due to chest pain she was placed on Nitroglycerin infusion.  She arrives to [facility] for observation overnight for PCI and Nitroglycerin drip.    Upon arrival to [facility] she is resting comfortably, hemodynamically stable, and in no acute distress.  Cardiology is primary service.  PCCM is not officially consulted, but is available for any critical care needs.  Continue medical management as per Cardiology including: dual antiplatelet therapy, BB, Imdur, ARB, and Nitroglycerin infusion.  Continue cardiac monitoring with EKG in the morning.  Plan is for possible future PCI of ostium of graft to obtuse marginal.    Darel Hong, AGACNP-BC [facility] Pager: [phone] Cell: [phone]

## 2019-02-14 ENCOUNTER — Encounter: Payer: Self-pay | Admitting: Internal Medicine

## 2019-02-14 ENCOUNTER — Ambulatory Visit: Payer: Medicare Other

## 2019-02-14 DIAGNOSIS — E7800 Pure hypercholesterolemia, unspecified: Secondary | ICD-10-CM | POA: Diagnosis not present

## 2019-02-14 DIAGNOSIS — I10 Essential (primary) hypertension: Secondary | ICD-10-CM | POA: Diagnosis not present

## 2019-02-14 DIAGNOSIS — I25119 Atherosclerotic heart disease of native coronary artery with unspecified angina pectoris: Secondary | ICD-10-CM | POA: Diagnosis not present

## 2019-02-14 DIAGNOSIS — Z1159 Encounter for screening for other viral diseases: Secondary | ICD-10-CM | POA: Diagnosis not present

## 2019-02-14 DIAGNOSIS — E785 Hyperlipidemia, unspecified: Secondary | ICD-10-CM | POA: Diagnosis not present

## 2019-02-14 DIAGNOSIS — I25790 Atherosclerosis of other coronary artery bypass graft(s) with unstable angina pectoris: Secondary | ICD-10-CM | POA: Diagnosis not present

## 2019-02-14 DIAGNOSIS — E119 Type 2 diabetes mellitus without complications: Secondary | ICD-10-CM | POA: Diagnosis not present

## 2019-02-14 LAB — CBC
HCT: 39.4 % (ref 36.0–46.0)
Hemoglobin: 12.5 g/dL (ref 12.0–15.0)
MCH: 25.9 pg — ABNORMAL LOW (ref 26.0–34.0)
MCHC: 31.7 g/dL (ref 30.0–36.0)
MCV: 81.7 fL (ref 80.0–100.0)
Platelets: 202 10*3/uL (ref 150–400)
RBC: 4.82 MIL/uL (ref 3.87–5.11)
RDW: 16 % — ABNORMAL HIGH (ref 11.5–15.5)
WBC: 13.6 10*3/uL — ABNORMAL HIGH (ref 4.0–10.5)
nRBC: 0 % (ref 0.0–0.2)

## 2019-02-14 LAB — BASIC METABOLIC PANEL
Anion gap: 7 (ref 5–15)
BUN: 11 mg/dL (ref 8–23)
CO2: 27 mmol/L (ref 22–32)
Calcium: 8.9 mg/dL (ref 8.9–10.3)
Chloride: 101 mmol/L (ref 98–111)
Creatinine, Ser: 0.6 mg/dL (ref 0.44–1.00)
GFR calc Af Amer: >60 mL/min (ref >60)
GFR calc non Af Amer: >60 mL/min (ref >60)
Glucose, Bld: 152 mg/dL — ABNORMAL HIGH (ref 70–99)
Potassium: 4.3 mmol/L (ref 3.5–5.1)
Sodium: 135 mmol/L (ref 135–145)

## 2019-02-14 MED ORDER — MORPHINE SULFATE (PF) 2 MG/ML IV SOLN
2.0000 mg | INTRAVENOUS | Status: DC | PRN
  Administered 2019-02-14 (×2): 2 mg via INTRAVENOUS
  Filled 2019-02-14: qty 1

## 2019-02-14 MED ORDER — MORPHINE SULFATE (PF) 2 MG/ML IV SOLN
INTRAVENOUS | Status: AC
  Administered 2019-02-14: 2 mg via INTRAVENOUS
  Filled 2019-02-14: qty 1

## 2019-02-14 MED ORDER — PROMETHAZINE HCL 25 MG/ML IJ SOLN: 12.5000 mg | Freq: Four times a day (QID) | INTRAMUSCULAR | Status: DC | PRN

## 2019-02-14 NOTE — Discharge Summary (Signed)
[facility] Cardiology Discharge Summary  Patient ID: Felicia Woods MRN: [id number] DOB/AGE: [birth year] 63 y.o.  Admit date: 02/13/2019 Discharge date: 02/14/2019  Primary Discharge Diagnosis: Ischemic chest pain I20.9 and Coronary artery bypass graft with angina I25.798 Secondary Discharge Diagnosis diabetes, high blood pressure and high cholesterol  Significant Diagnostic Studies: Cardiac cath with left ventricular angiogram and selective coronary injection as well as PCI and stent placement of graft to rca.  Hospital Course: The patient was admitted to specials for cardiac cath with selective coronary angiogram after full consent, risk and benefits explained, and time out called with all approprate details voiced and discussed. The patient has had progressive canadian class 4 angina  consistent with ischemic chest pain and or anginal equivalent with coronary artery risk factors including diabetes, high blood pressure and high cholesterol. The procedure was performed without complication and it revealed normal left ventricular function with ejection fraction of 50%.  It was found that the patient had severe 1 vessel coronary atherosclerosis with significant graft to rca stenosis requiring further intervention. Therefore, the patient had a PCI and drug eluding stent placed without complication. The patient has been ambulating without further significant symptoms and has reached his maximal hospital benefit and will be discharged to home in good condition.  Cardiac rehabilitation has been discussed and recommended. Medication management of cardiovascular risk factors will be given post discharge and modified as an outpatient.   Discharge Exam: Blood pressure 123/69, pulse 80, temperature 98.4 F (36.9 C), temperature source Oral, resp. rate 19, height 5\' 7"  (1.702 m), weight 119.5 kg, SpO2 99 %.  Constitutional: Alet oriented to person, place, and time. No distress.  HENT: No nasal  discharge.  Head: Normocephalic and atraumatic.  Eyes: Pupils are equal and round. No discharge.  Neck: Normal range of motion. Neck supple. No JVD present. No thyromegaly present.  Cardiovascular: Normal rate, regular rhythm, normal S1 S2, no gallop, no friction rub. No murmur Pulmonary/Chest: Effort normal, No stridor. No respiratory distress. no wheezes.  no rales.    Abdominal: Soft. Bowel sounds are normal.  no distension.  no tenderness. There is no rebound and no guarding.  Musculoskeletal: No edema, no cyanosis, normal pulses, no bleeding, Normal range of motion. no tenderness.  Neurological:  alert and oriented to person, place, and time. Coordination normal.  Skin: Skin is warm and dry. No rash noted. No erythema. No pallor.  Psychiatric:  normal mood and affect. behavior is normal.    Labs:   Lab Results  Component Value Date   WBC 13.6 (H) 02/14/2019   HGB 12.5 02/14/2019   HCT 39.4 02/14/2019   MCV 81.7 02/14/2019   PLT 202 02/14/2019    Recent Labs  Lab 02/14/19 0335  NA 135  K 4.3  CL 101  CO2 27  BUN 11  CREATININE 0.60  CALCIUM 8.9  GLUCOSE 152*    EKG: NSR without evidence of new changes  FOLLOW UP IN ONE TO TWO WEEKS Discharge Instructions    AMB Referral to Cardiac Rehabilitation - Phase II   Complete by: As directed    Diagnosis: Coronary Stents   After initial evaluation and assessments completed: Virtual Based Care may be provided alone or in conjunction with Phase 2 Cardiac Rehab based on patient barriers.: No     Allergies as of 02/14/2019      Reactions   Penicillin G Anaphylaxis, Other (See Comments)   Has patient had a PCN reaction causing immediate rash, facial/tongue/throat swelling,  SOB or lightheadedness with hypotension: SFK:[id number]} Has patient had a PCN reaction causing severe rash involving mucus membranes or skin necrosis: no:[id number]} Has patient had a PCN reaction that required hospitalization no:[id number]} Has patient had a  PCN reaction occurring within the last 10 years: no:[id number]} If all of the above answers are "NO", then may proceed with Cephalosporin use.      Medication List    STOP taking these medications   esomeprazole 40 MG capsule Commonly known as: NexIUM   ondansetron 4 MG tablet Commonly known as: ZOFRAN     TAKE these medications   Accu-Chek Softclix Lancets lancets Use as instructed once  DAILY DIAG -E11.9   albuterol 108 (90 Base) MCG/ACT inhaler Commonly known as: VENTOLIN HFA Inhale 2 puffs into the lungs every 6 (six) hours as needed for wheezing or shortness of breath.   amLODipine 10 MG tablet Commonly known as: NORVASC Take 1 tablet (10 mg total) by mouth daily.   aspirin EC 81 MG tablet Take 81 mg by mouth daily.   atorvastatin 20 MG tablet Commonly known as: LIPITOR Take 1 tablet (20 mg total) by mouth daily.   bisoprolol-hydrochlorothiazide 5-6.25 MG tablet Commonly known as: ZIAC Take 1 tablet by mouth daily.   budesonide-formoterol 160-4.5 MCG/ACT inhaler Commonly known as: SYMBICORT Inhale 2 puffs into the lungs 2 (two) times daily as needed (respiratory issues.).   clopidogrel 75 MG tablet Commonly known as: PLAVIX Take 1 tablet (75 mg total) by mouth daily.   cyclobenzaprine 10 MG tablet Commonly known as: FLEXERIL Take 1 tablet (10 mg total) by mouth 3 (three) times daily as needed for muscle spasms. What changed: when to take this   estradiol 0.5 MG tablet Commonly known as: ESTRACE Take 1 tablet (0.5 mg total) by mouth daily.   furosemide 40 MG tablet Commonly known as: LASIX Take 1 tablet (40 mg total) by mouth 2 (two) times daily.   glimepiride 1 MG tablet Commonly known as: AMARYL Take 1 tablet (1 mg total) by mouth daily with breakfast.   glucose blood test strip Commonly known as: Accu-Chek Aviva Plus 1 each by Other route as needed for other. Use as instructed once  DAILY DIAG -E11.9   isosorbide mononitrate 30 MG 24 hr  tablet Commonly known as: IMDUR Take 30 mg by mouth daily.   losartan 100 MG tablet Commonly known as: COZAAR Take one tab po qd What changed:   how much to take  how to take this  when to take this   montelukast 10 MG tablet Commonly known as: SINGULAIR Take 1 tablet (10 mg total) by mouth daily.   nitroGLYCERIN 0.4 MG SL tablet Commonly known as: NITROSTAT Place 0.4 mg under the tongue every 5 (five) minutes x 3 doses as needed for chest pain. *If no relief call MD or go to Emergency Room*   oxybutynin 5 MG tablet Commonly known as: DITROPAN Take 1 tablet (5 mg total) by mouth daily after supper.   sertraline 100 MG tablet Commonly known as: ZOLOFT Take 1 tablet (100 mg total) by mouth daily.   Trulicity 1.5 YF/7.4BS Sopn Generic drug: Dulaglutide Inject 1.5 mg into the skin once a week. What changed: when to take this   zolpidem 10 MG tablet Commonly known as: AMBIEN Take 1 tablet (10 mg total) by mouth at bedtime.      Follow-up Information    Corey Skains, MD Follow up in 1 week(s).   Specialty: Cardiology Contact information:  [address] [place] [address] [phone]           THE PATIENT  SHALL BRING ALL MEDICATIONS TO FOLLOW UP APPOINTMENT  Signed:  Corey Skains MD, [facility] 02/14/2019, 8:54 AM

## 2019-02-14 NOTE — Progress Notes (Signed)
Pt being discharged to home. Pt provided discharge packet with explanation of d/c instructions. Awaiting pt's husband to pick her up at this time.

## 2019-02-14 NOTE — Progress Notes (Signed)
2130 Patient brought from procedures , on 3L nasal cannula, femoral site clean, intact, no bleeding or bruising. Patient on nitroglycerin gtt but is no longer feeling chest pain.   Patient off nitro drip 0100.   0400 Patient complaining of 10/10 chest pain; same type of pain she felt post-procedure. NP Dewaine Conger notified; nitro gtt restarted and morphine PRN ordered. Patient with one episode of emesis; states "pain is easing off." Will continue to monitor.

## 2019-02-14 NOTE — Progress Notes (Signed)
Plans for pt to discharge today. Dr. Nehemiah Massed wants pt to ambulate in hall prior to d/c. Pt has stated that she wants to wait until after lunch to take a walk.

## 2019-02-20 ENCOUNTER — Other Ambulatory Visit: Payer: Self-pay

## 2019-02-20 ENCOUNTER — Encounter: Payer: Medicare Other | Attending: Internal Medicine | Admitting: *Deleted

## 2019-02-20 ENCOUNTER — Ambulatory Visit
Admission: RE | Admit: 2019-02-20 | Discharge: 2019-02-20 | Disposition: A | Discharge status: Home / Self Care | Payer: Medicare Other | Source: Ambulatory Visit | Attending: Student | Admitting: Student

## 2019-02-20 DIAGNOSIS — I2510 Atherosclerotic heart disease of native coronary artery without angina pectoris: Secondary | ICD-10-CM | POA: Insufficient documentation

## 2019-02-20 DIAGNOSIS — M5416 Radiculopathy, lumbar region: Secondary | ICD-10-CM

## 2019-02-20 DIAGNOSIS — E119 Type 2 diabetes mellitus without complications: Secondary | ICD-10-CM | POA: Insufficient documentation

## 2019-02-20 DIAGNOSIS — Z7902 Long term (current) use of antithrombotics/antiplatelets: Secondary | ICD-10-CM | POA: Insufficient documentation

## 2019-02-20 DIAGNOSIS — J45909 Unspecified asthma, uncomplicated: Secondary | ICD-10-CM | POA: Insufficient documentation

## 2019-02-20 DIAGNOSIS — Z955 Presence of coronary angioplasty implant and graft: Secondary | ICD-10-CM

## 2019-02-20 DIAGNOSIS — Z7982 Long term (current) use of aspirin: Secondary | ICD-10-CM | POA: Insufficient documentation

## 2019-02-20 DIAGNOSIS — I10 Essential (primary) hypertension: Secondary | ICD-10-CM | POA: Insufficient documentation

## 2019-02-20 DIAGNOSIS — E785 Hyperlipidemia, unspecified: Secondary | ICD-10-CM | POA: Insufficient documentation

## 2019-02-20 DIAGNOSIS — I252 Old myocardial infarction: Secondary | ICD-10-CM | POA: Insufficient documentation

## 2019-02-20 DIAGNOSIS — Z79899 Other long term (current) drug therapy: Secondary | ICD-10-CM | POA: Insufficient documentation

## 2019-02-20 NOTE — Progress Notes (Signed)
Completed virtual orientation visit

## 2019-02-22 ENCOUNTER — Other Ambulatory Visit: Payer: Self-pay

## 2019-02-22 ENCOUNTER — Encounter: Payer: Medicare Other | Admitting: *Deleted

## 2019-02-22 VITALS — Ht 66.6 in | Wt 259.6 lb

## 2019-02-22 DIAGNOSIS — Z955 Presence of coronary angioplasty implant and graft: Secondary | ICD-10-CM | POA: Diagnosis not present

## 2019-02-22 DIAGNOSIS — E785 Hyperlipidemia, unspecified: Secondary | ICD-10-CM | POA: Diagnosis not present

## 2019-02-22 DIAGNOSIS — I252 Old myocardial infarction: Secondary | ICD-10-CM | POA: Diagnosis not present

## 2019-02-22 DIAGNOSIS — Z79899 Other long term (current) drug therapy: Secondary | ICD-10-CM | POA: Diagnosis not present

## 2019-02-22 DIAGNOSIS — J45909 Unspecified asthma, uncomplicated: Secondary | ICD-10-CM | POA: Diagnosis not present

## 2019-02-22 DIAGNOSIS — I2510 Atherosclerotic heart disease of native coronary artery without angina pectoris: Secondary | ICD-10-CM | POA: Diagnosis not present

## 2019-02-22 DIAGNOSIS — I10 Essential (primary) hypertension: Secondary | ICD-10-CM | POA: Diagnosis not present

## 2019-02-22 DIAGNOSIS — Z7982 Long term (current) use of aspirin: Secondary | ICD-10-CM | POA: Diagnosis not present

## 2019-02-22 DIAGNOSIS — Z7902 Long term (current) use of antithrombotics/antiplatelets: Secondary | ICD-10-CM | POA: Diagnosis not present

## 2019-02-22 DIAGNOSIS — E119 Type 2 diabetes mellitus without complications: Secondary | ICD-10-CM | POA: Diagnosis not present

## 2019-02-22 NOTE — Patient Instructions (Signed)
Patient Instructions  Patient Details  Name: Felicia Woods MRN: [id number] Date of Birth: [birth year] Referring Provider:  Corey Skains, MD  Below are your personal goals for exercise, nutrition, and risk factors. Our goal is to help you stay on track towards obtaining and maintaining these goals. We will be discussing your progress on these goals with you throughout the program.  Initial Exercise Prescription: Initial Exercise Prescription - 02/22/19 1000      Date of Initial Exercise RX and Referring Provider   Date  02/22/19    Referring Provider  Serafina Royals MD      Treadmill   MPH  1.4    Grade  0    Minutes  15    METs  2.07      NuStep   Level  1    SPM  80    Minutes  15    METs  2      Elliptical   Level  1    Speed  2.5    Minutes  15      Prescription Details   Frequency (times per week)  3    Duration  Progress to 30 minutes of continuous aerobic without signs/symptoms of physical distress      Intensity   THRR 40-80% of Max Heartrate  112-143    Ratings of Perceived Exertion  11-13    Perceived Dyspnea  0-4      Progression   Progression  Continue to progress workloads to maintain intensity without signs/symptoms of physical distress.      Resistance Training   Training Prescription  Yes    Weight  3 lbs    Reps  10-15       Exercise Goals: Frequency: Be able to perform aerobic exercise two to three times per week in program working toward 2-5 days per week of home exercise.  Intensity: Work with a perceived exertion of 11 (fairly light) - 15 (hard) while following your exercise prescription.  We will make changes to your prescription with you as you progress through the program.   Duration: Be able to do 30 to 45 minutes of continuous aerobic exercise in addition to a 5 minute warm-up and a 5 minute cool-down routine.   Nutrition Goals: Your personal nutrition goals will be established when you do your nutrition analysis with the  dietician.  The following are general nutrition guidelines to follow: Cholesterol < 200mg /day Sodium < 1500mg /day Fiber: Women over 50 yrs - 21 grams per day  Personal Goals: Personal Goals and Risk Factors at Admission - 02/22/19 1057      Core Components/Risk Factors/Patient Goals on Admission    Weight Management  Yes;Obesity;Weight Loss    Intervention  Weight Management: Develop a combined nutrition and exercise program designed to reach desired caloric intake, while maintaining appropriate intake of nutrient and fiber, sodium and fats, and appropriate energy expenditure required for the weight goal.;Weight Management: Provide education and appropriate resources to help participant work on and attain dietary goals.;Weight Management/Obesity: Establish reasonable short term and long term weight goals.;Obesity: Provide education and appropriate resources to help participant work on and attain dietary goals.    Admit Weight  259 lb 9.6 oz (117.8 kg)    Goal Weight: Short Term  255 lb (115.7 kg)    Goal Weight: Long Term  249 lb (112.9 kg)    Expected Outcomes  Short Term: Continue to assess and modify interventions until short term weight is achieved;Long  Term: Adherence to nutrition and physical activity/exercise program aimed toward attainment of established weight goal;Weight Loss: Understanding of general recommendations for a balanced deficit meal plan, which promotes 1-2 lb weight loss per week and includes a negative energy balance of [phone] kcal/d;Understanding recommendations for meals to include 15-35% energy as protein, 25-35% energy from fat, 35-60% energy from carbohydrates, less than 200mg  of dietary cholesterol, 20-35 gm of total fiber daily;Understanding of distribution of calorie intake throughout the day with the consumption of 4-5 meals/snacks    Diabetes  Yes    Intervention  Provide education about signs/symptoms and action to take for hypo/hyperglycemia.;Provide education  about proper nutrition, including hydration, and aerobic/resistive exercise prescription along with prescribed medications to achieve blood glucose in normal ranges: Fasting glucose 65-99 mg/dL    Expected Outcomes  Short Term: Participant verbalizes understanding of the signs/symptoms and immediate care of hyper/hypoglycemia, proper foot care and importance of medication, aerobic/resistive exercise and nutrition plan for blood glucose control.;Long Term: Attainment of HbA1C < 7%.    Hypertension  Yes    Intervention  Provide education on lifestyle modifcations including regular physical activity/exercise, weight management, moderate sodium restriction and increased consumption of fresh fruit, vegetables, and low fat dairy, alcohol moderation, and smoking cessation.;Monitor prescription use compliance.    Expected Outcomes  Short Term: Continued assessment and intervention until BP is < 140/80mm HG in hypertensive participants. < 130/66mm HG in hypertensive participants with diabetes, heart failure or chronic kidney disease.;Long Term: Maintenance of blood pressure at goal levels.    Lipids  Yes    Intervention  Provide education and support for participant on nutrition & aerobic/resistive exercise along with prescribed medications to achieve LDL 70mg , HDL >40mg .    Expected Outcomes  Short Term: Participant states understanding of desired cholesterol values and is compliant with medications prescribed. Participant is following exercise prescription and nutrition guidelines.;Long Term: Cholesterol controlled with medications as prescribed, with individualized exercise RX and with personalized nutrition plan. Value goals: LDL < 70mg , HDL > 40 mg.       Tobacco Use Initial Evaluation: Social History   Tobacco Use  Smoking Status Never Smoker  Smokeless Tobacco Never Used    Exercise Goals and Review: Exercise Goals    Row Name 02/22/19 1055             Exercise Goals   Increase Physical  Activity  Yes       Intervention  Provide advice, education, support and counseling about physical activity/exercise needs.;Develop an individualized exercise prescription for aerobic and resistive training based on initial evaluation findings, risk stratification, comorbidities and participant's personal goals.       Expected Outcomes  Short Term: Attend rehab on a regular basis to increase amount of physical activity.;Long Term: Add in home exercise to make exercise part of routine and to increase amount of physical activity.;Long Term: Exercising regularly at least 3-5 days a week.       Increase Strength and Stamina  Yes       Intervention  Provide advice, education, support and counseling about physical activity/exercise needs.;Develop an individualized exercise prescription for aerobic and resistive training based on initial evaluation findings, risk stratification, comorbidities and participant's personal goals.       Expected Outcomes  Short Term: Increase workloads from initial exercise prescription for resistance, speed, and METs.;Short Term: Perform resistance training exercises routinely during rehab and add in resistance training at home;Long Term: Improve cardiorespiratory fitness, muscular endurance and strength as measured by increased  METs and functional capacity (6MWT)       Able to understand and use rate of perceived exertion (RPE) scale  Yes       Intervention  Provide education and explanation on how to use RPE scale       Expected Outcomes  Short Term: Able to use RPE daily in rehab to express subjective intensity level;Long Term:  Able to use RPE to guide intensity level when exercising independently       Able to understand and use Dyspnea scale  Yes       Intervention  Provide education and explanation on how to use Dyspnea scale       Expected Outcomes  Short Term: Able to use Dyspnea scale daily in rehab to express subjective sense of shortness of breath during exertion;Long  Term: Able to use Dyspnea scale to guide intensity level when exercising independently       Knowledge and understanding of Target Heart Rate Range (THRR)  Yes       Intervention  Provide education and explanation of THRR including how the numbers were predicted and where they are located for reference       Expected Outcomes  Short Term: Able to state/look up THRR;Short Term: Able to use daily as guideline for intensity in rehab;Long Term: Able to use THRR to govern intensity when exercising independently       Able to check pulse independently  Yes       Intervention  Provide education and demonstration on how to check pulse in carotid and radial arteries.;Review the importance of being able to check your own pulse for safety during independent exercise       Expected Outcomes  Short Term: Able to explain why pulse checking is important during independent exercise;Long Term: Able to check pulse independently and accurately       Understanding of Exercise Prescription  Yes       Intervention  Provide education, explanation, and written materials on patient's individual exercise prescription       Expected Outcomes  Short Term: Able to explain program exercise prescription;Long Term: Able to explain home exercise prescription to exercise independently          Copy of goals given to participant.

## 2019-02-22 NOTE — Progress Notes (Signed)
Cardiac Individual Treatment Plan  Patient Details  Name: Felicia Woods MRN: [id number] Date of Birth: March 25, 1956 Referring Provider:     Cardiac Rehab from 02/22/2019 in [facility] Cardiac and Pulmonary Rehab  Referring Provider  Serafina Royals MD      Initial Encounter Date:    Cardiac Rehab from 02/22/2019 in [facility] Cardiac and Pulmonary Rehab  Date  02/22/19      Visit Diagnosis: S/P coronary artery stent placement  Patient's Home Medications on Admission:  Current Outpatient Medications:  .  ACCU-CHEK SOFTCLIX LANCETS lancets, Use as instructed once  DAILY DIAG -E11.9, Disp: 100 each, Rfl: 3 .  albuterol (PROVENTIL HFA;VENTOLIN HFA) 108 (90 Base) MCG/ACT inhaler, Inhale 2 puffs into the lungs every 6 (six) hours as needed for wheezing or shortness of breath., Disp: , Rfl:  .  amLODipine (NORVASC) 10 MG tablet, Take 1 tablet (10 mg total) by mouth daily., Disp: 90 tablet, Rfl: 1 .  aspirin EC 81 MG tablet, Take 81 mg by mouth daily., Disp: , Rfl:  .  atorvastatin (LIPITOR) 20 MG tablet, Take 1 tablet (20 mg total) by mouth daily., Disp: 90 tablet, Rfl: 3 .  bisoprolol-hydrochlorothiazide (ZIAC) 5-6.25 MG tablet, Take 1 tablet by mouth daily., Disp: 90 tablet, Rfl: 2 .  budesonide-formoterol (SYMBICORT) 160-4.5 MCG/ACT inhaler, Inhale 2 puffs into the lungs 2 (two) times daily as needed (respiratory issues.). , Disp: , Rfl:  .  clopidogrel (PLAVIX) 75 MG tablet, Take 1 tablet (75 mg total) by mouth daily., Disp: 90 tablet, Rfl: 3 .  cyclobenzaprine (FLEXERIL) 10 MG tablet, Take 1 tablet (10 mg total) by mouth 3 (three) times daily as needed for muscle spasms. (Patient taking differently: Take 10 mg by mouth 3 (three) times daily. ), Disp: 30 tablet, Rfl: 1 .  Dulaglutide (TRULICITY) 1.5 DT/2.6ZT SOPN, Inject 1.5 mg into the skin once a week. (Patient taking differently: Inject 1.5 mg into the skin every Thursday. ), Disp: 12 pen, Rfl: 2 .  estradiol (ESTRACE) 0.5 MG tablet, Take 1 tablet  (0.5 mg total) by mouth daily., Disp: 90 tablet, Rfl: 1 .  furosemide (LASIX) 40 MG tablet, Take 1 tablet (40 mg total) by mouth 2 (two) times daily., Disp: 180 tablet, Rfl: 1 .  glimepiride (AMARYL) 1 MG tablet, Take 1 tablet (1 mg total) by mouth daily with breakfast., Disp: 90 tablet, Rfl: 2 .  glucose blood (ACCU-CHEK AVIVA PLUS) test strip, 1 each by Other route as needed for other. Use as instructed once  DAILY DIAG -E11.9, Disp: 100 each, Rfl: 3 .  isosorbide mononitrate (IMDUR) 30 MG 24 hr tablet, Take 30 mg by mouth daily., Disp: , Rfl:  .  losartan (COZAAR) 100 MG tablet, Take one tab po qd (Patient taking differently: Take 100 mg by mouth daily. Take one tab po qd), Disp: 90 tablet, Rfl: 3 .  montelukast (SINGULAIR) 10 MG tablet, Take 1 tablet (10 mg total) by mouth daily., Disp: 90 tablet, Rfl: 1 .  nitroGLYCERIN (NITROSTAT) 0.4 MG SL tablet, Place 0.4 mg under the tongue every 5 (five) minutes x 3 doses as needed for chest pain. *If no relief call MD or go to Emergency Room*, Disp: , Rfl:  .  oxybutynin (DITROPAN) 5 MG tablet, Take 1 tablet (5 mg total) by mouth daily after supper., Disp: 90 tablet, Rfl: 1 .  sertraline (ZOLOFT) 100 MG tablet, Take 1 tablet (100 mg total) by mouth daily. (Patient not taking: Reported on 02/20/2019), Disp: 90 tablet, Rfl:  3 .  zolpidem (AMBIEN) 10 MG tablet, Take 1 tablet (10 mg total) by mouth at bedtime., Disp: 90 tablet, Rfl: 1  Past Medical History: Past Medical History:  Diagnosis Date  . Asthma   . Coronary artery disease   . Diabetes mellitus without complication ([place])   . Hyperlipidemia   . Hypertension   . MI (myocardial infarction) ([place])   . Migraine headache with aura   . 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    Tobacco Use: Social History   Tobacco Use  Smoking Status Never Smoker  Smokeless Tobacco Never Used    Labs: Recent Review Flowsheet Data    Labs for ITP Cardiac and Pulmonary Rehab Latest Ref Rng & Units 07/29/2017  01/11/2018 05/01/2018 09/08/2018 01/18/2019   Cholestrol 0 - 200 mg/dL - - - - -   LDLCALC 0 - 99 mg/dL - - - - -   HDL >40 mg/dL - - - - -   Trlycerides <150 mg/dL - - - - -   Hemoglobin A1c 4.0 - 5.6 % 6.5 6.7(A) 6.6(A) 6.4(A) 6.6(A)   TCO2 0 - 100 mmol/L - - - - -       Exercise Target Goals: Exercise Program Goal: Individual exercise prescription set using results from initial 6 min walk test and THRR while considering  patient's activity barriers and safety.   Exercise Prescription Goal: Initial exercise prescription builds to 30-45 minutes a day of aerobic activity, 2-3 days per week.  Home exercise guidelines will be given to patient during program as part of exercise prescription that the participant will acknowledge.  Activity Barriers & Risk Stratification: Activity Barriers & Cardiac Risk Stratification - 02/20/19 1450      Activity Barriers & Cardiac Risk Stratification   Activity Barriers  Deconditioning;Muscular Weakness;Shortness of Breath;Back Problems;Joint Problems;Arthritis;Chest Pain/Angina   R knee pain   Cardiac Risk Stratification  High       6 Minute Walk: 6 Minute Walk    Row Name 02/22/19 1047         6 Minute Walk   Phase  Initial     Distance  370 feet     Walk Time  2.92 minutes     # of Rest Breaks  1 stopped due to chest pressure at 2:55     MPH  1.44     METS  1.37     RPE  15     Perceived Dyspnea   3     VO2 Peak  4.79     Symptoms  Yes (comment)     Comments  SOB, chest pressure "elephant on chest" 9/10, eased with rest to 4/10, gone prior to d/c.  No EKG changes noted     Resting HR  81 bpm     Resting BP  126/64     Resting Oxygen Saturation   97 %     Exercise Oxygen Saturation  during 6 min walk  92 %     Max Ex. HR  133 bpm     Max Ex. BP  138/74     2 Minute Post BP  124/66       Interval HR   1 Minute HR  116     2 Minute HR  126     3 Minute HR  133     4 Minute HR  107     5 Minute HR  88     2 Minute Post HR  86      Interval Heart Rate?  Yes       Interval Oxygen   Interval Oxygen?  Yes     Baseline Oxygen Saturation %  97 %     1 Minute Oxygen Saturation %  96 %     1 Minute Liters of Oxygen  0 L Room Air     2 Minute Oxygen Saturation %  95 %     2 Minute Liters of Oxygen  0 L     3 Minute Oxygen Saturation %  92 %     3 Minute Liters of Oxygen  0 L     4 Minute Oxygen Saturation %  98 %     4 Minute Liters of Oxygen  0 L     5 Minute Oxygen Saturation %  98 %     5 Minute Liters of Oxygen  0 L     2 Minute Post Oxygen Saturation %  98 %     2 Minute Post Liters of Oxygen  0 L        Oxygen Initial Assessment:   Oxygen Re-Evaluation:   Oxygen Discharge (Final Oxygen Re-Evaluation):   Initial Exercise Prescription: Initial Exercise Prescription - 02/22/19 1000      Date of Initial Exercise RX and Referring Provider   Date  02/22/19    Referring Provider  Serafina Royals MD      Treadmill   MPH  1.4    Grade  0    Minutes  15    METs  2.07      NuStep   Level  1    SPM  80    Minutes  15    METs  2      Elliptical   Level  1    Speed  2.5    Minutes  15      Prescription Details   Frequency (times per week)  3    Duration  Progress to 30 minutes of continuous aerobic without signs/symptoms of physical distress      Intensity   THRR 40-80% of Max Heartrate  112-143    Ratings of Perceived Exertion  11-13    Perceived Dyspnea  0-4      Progression   Progression  Continue to progress workloads to maintain intensity without signs/symptoms of physical distress.      Resistance Training   Training Prescription  Yes    Weight  3 lbs    Reps  10-15       Perform Capillary Blood Glucose checks as needed.  Exercise Prescription Changes: Exercise Prescription Changes    Row Name 02/22/19 1000             Response to Exercise   Blood Pressure (Admit)  126/64       Blood Pressure (Exercise)  138/74       Blood Pressure (Exit)  124/66       Heart Rate  (Admit)  81 bpm       Heart Rate (Exercise)  133 bpm       Heart Rate (Exit)  86 bpm       Oxygen Saturation (Admit)  97 %       Oxygen Saturation (Exercise)  92 %       Oxygen Saturation (Exit)  98 %       Rating of Perceived Exertion (Exercise)  15       Perceived Dyspnea (  Exercise)  3       Symptoms  chest pressure 9/10, SOB       Comments  walk test results          Exercise Comments:   Exercise Goals and Review: Exercise Goals    Row Name 02/22/19 1055             Exercise Goals   Increase Physical Activity  Yes       Intervention  Provide advice, education, support and counseling about physical activity/exercise needs.;Develop an individualized exercise prescription for aerobic and resistive training based on initial evaluation findings, risk stratification, comorbidities and participant's personal goals.       Expected Outcomes  Short Term: Attend rehab on a regular basis to increase amount of physical activity.;Long Term: Add in home exercise to make exercise part of routine and to increase amount of physical activity.;Long Term: Exercising regularly at least 3-5 days a week.       Increase Strength and Stamina  Yes       Intervention  Provide advice, education, support and counseling about physical activity/exercise needs.;Develop an individualized exercise prescription for aerobic and resistive training based on initial evaluation findings, risk stratification, comorbidities and participant's personal goals.       Expected Outcomes  Short Term: Increase workloads from initial exercise prescription for resistance, speed, and METs.;Short Term: Perform resistance training exercises routinely during rehab and add in resistance training at home;Long Term: Improve cardiorespiratory fitness, muscular endurance and strength as measured by increased METs and functional capacity (6MWT)       Able to understand and use rate of perceived exertion (RPE) scale  Yes       Intervention   Provide education and explanation on how to use RPE scale       Expected Outcomes  Short Term: Able to use RPE daily in rehab to express subjective intensity level;Long Term:  Able to use RPE to guide intensity level when exercising independently       Able to understand and use Dyspnea scale  Yes       Intervention  Provide education and explanation on how to use Dyspnea scale       Expected Outcomes  Short Term: Able to use Dyspnea scale daily in rehab to express subjective sense of shortness of breath during exertion;Long Term: Able to use Dyspnea scale to guide intensity level when exercising independently       Knowledge and understanding of Target Heart Rate Range (THRR)  Yes       Intervention  Provide education and explanation of THRR including how the numbers were predicted and where they are located for reference       Expected Outcomes  Short Term: Able to state/look up THRR;Short Term: Able to use daily as guideline for intensity in rehab;Long Term: Able to use THRR to govern intensity when exercising independently       Able to check pulse independently  Yes       Intervention  Provide education and demonstration on how to check pulse in carotid and radial arteries.;Review the importance of being able to check your own pulse for safety during independent exercise       Expected Outcomes  Short Term: Able to explain why pulse checking is important during independent exercise;Long Term: Able to check pulse independently and accurately       Understanding of Exercise Prescription  Yes       Intervention  Provide education,  explanation, and written materials on patient's individual exercise prescription       Expected Outcomes  Short Term: Able to explain program exercise prescription;Long Term: Able to explain home exercise prescription to exercise independently          Exercise Goals Re-Evaluation :   Discharge Exercise Prescription (Final Exercise Prescription Changes): Exercise  Prescription Changes - 02/22/19 1000      Response to Exercise   Blood Pressure (Admit)  126/64    Blood Pressure (Exercise)  138/74    Blood Pressure (Exit)  124/66    Heart Rate (Admit)  81 bpm    Heart Rate (Exercise)  133 bpm    Heart Rate (Exit)  86 bpm    Oxygen Saturation (Admit)  97 %    Oxygen Saturation (Exercise)  92 %    Oxygen Saturation (Exit)  98 %    Rating of Perceived Exertion (Exercise)  15    Perceived Dyspnea (Exercise)  3    Symptoms  chest pressure 9/10, SOB    Comments  walk test results       Nutrition:  Target Goals: Understanding of nutrition guidelines, daily intake of sodium <1549m, cholesterol <2031m calories 30% from fat and 7% or less from saturated fats, daily to have 5 or more servings of fruits and vegetables.  Biometrics: Pre Biometrics - 02/22/19 1056      Pre Biometrics   Height  5' 6.6" (1.692 m)    Weight  259 lb 9.6 oz (117.8 kg)    BMI (Calculated)  41.13    Single Leg Stand  27 seconds        Nutrition Therapy Plan and Nutrition Goals: Nutrition Therapy & Goals - 02/22/19 1037      Nutrition Therapy   Diet  Low Na, HH, DM diet    Drug/Food Interactions  Statins/Certain Fruits    Protein (specify units)  95-100g    Fiber  25 grams    Whole Grain Foods  3 servings    Saturated Fats  12 max. grams    Fruits and Vegetables  5 servings/day    Sodium  1.5 grams      Personal Nutrition Goals   Nutrition Goal  ST: review information and think about a short term goal LT: not come back to rehab    Comments  Pt reports not wanting to come back to rehab another time after this round. Pt reports T2DM is doing well last A1C 6.4. Pt reports for breakfast having OJ and fruit or OJ and cereal w/ whole milk and toast or oatmeal and fruit with OJ. Pt reports for lunch will havbe tu[place]wich with some fruit. For dinner pt will have meat, vegetables (loves greens), and grains. Pt reports having honey wheat bread and white rice when choosing  grains. Pt reports mid afternoon snack to be graham  crackers and night snack to be sugar free ice cream, graham crackers or vanilla waffers. Pt reports having 2 eggs once a week, cheese 2x/week, chicken/fish 2-3x/week, pork chops or steak 2-3x/week, butter 2-3x/week, packaged dinners 2x/week, and not eating fried foods. Discussed small changes, HH and DM eating and fluid retention with Na.      Intervention Plan   Intervention  Prescribe, educate and counsel regarding individualized specific dietary modifications aiming towards targeted core components such as weight, hypertension, lipid management, diabetes, heart failure and other comorbidities.;Nutrition handout(s) given to patient.    Expected Outcomes  Short Term Goal: Understand basic  principles of dietary content, such as calories, fat, sodium, cholesterol and nutrients.;Long Term Goal: Adherence to prescribed nutrition plan.;Short Term Goal: A plan has been developed with personal nutrition goals set during dietitian appointment.       Nutrition Assessments: Nutrition Assessments - 02/22/19 1051      MEDFICTS Scores   Pre Score  45       Nutrition Goals Re-Evaluation:   Nutrition Goals Discharge (Final Nutrition Goals Re-Evaluation):   Psychosocial: Target Goals: Acknowledge presence or absence of significant depression and/or stress, maximize coping skills, provide positive support system. Participant is able to verbalize types and ability to use techniques and skills needed for reducing stress and depression.   Initial Review & Psychosocial Screening: Initial Psych Review & Screening - 02/20/19 1440      Initial Review   Current issues with  History of Depression;Current Stress Concerns;Current Sleep Concerns    Comments  Hx Depression when father died 56 months, takes Ambien for sleep and sleeps well with it      [place]?  Yes    Concerns  Recent loss of significant other   see above note    Comments  Fiance is support system      Barriers   Psychosocial barriers to participate in program  Psychosocial barriers identified (see note);The patient should benefit from training in stress management and relaxation.      Screening Interventions   Interventions  Encouraged to exercise;To provide support and resources with identified psychosocial needs;Program counselor consult;Provide feedback about the scores to participant    Expected Outcomes  Short Term goal: Utilizing psychosocial counselor, staff and physician to assist with identification of specific Stressors or current issues interfering with healing process. Setting desired goal for each stressor or current issue identified.;Long Term Goal: Stressors or current issues are controlled or eliminated.;Short Term goal: Identification and review with participant of any Quality of Life or Depression concerns found by scoring the questionnaire.;Long Term goal: The participant improves quality of Life and PHQ9 Scores as seen by post scores and/or verbalization of changes       Quality of Life Scores:  Quality of Life - 02/22/19 1056      Quality of Life   Select  Quality of Life      Quality of Life Scores   Health/Function Pre  21.73 %    Socioeconomic Pre  26.58 %    Psych/Spiritual Pre  30 %    Family Pre  27.6 %    GLOBAL Pre  25.26 %      Scores of 19 and below usually indicate a poorer quality of life in these areas.  A difference of  2-3 points is a clinically meaningful difference.  A difference of 2-3 points in the total score of the Quality of Life Index has been associated with significant improvement in overall quality of life, self-image, physical symptoms, and general health in studies assessing change in quality of life.  PHQ-9: Recent Review Flowsheet Data    Depression screen [facility] 2/9 02/22/2019 01/18/2019 09/08/2018 06/12/2018 04/03/2018   Decreased Interest 0 0 0 0 0   Down, Depressed, Hopeless 0 0 0 0 0   PHQ - 2  Score 0 0 0 0 0   Altered sleeping 2 - - - -   Tired, decreased energy 3 - - - -   Change in appetite 0 - - - -   Feeling bad or failure about yourself  0 - - - -   Trouble concentrating 0 - - - -   Moving slowly or fidgety/restless 2 - - - -   Suicidal thoughts 0 - - - -   PHQ-9 Score 7 - - - -   Difficult doing work/chores Somewhat difficult - - - -     Interpretation of Total Score  Total Score Depression Severity:  1-4 = Minimal depression, 5-9 = Mild depression, 10-14 = Moderate depression, 15-19 = Moderately severe depression, 20-27 = Severe depression   Psychosocial Evaluation and Intervention:   Psychosocial Re-Evaluation:   Psychosocial Discharge (Final Psychosocial Re-Evaluation):   Vocational Rehabilitation: Provide vocational rehab assistance to qualifying candidates.   Vocational Rehab Evaluation & Intervention: Vocational Rehab - 02/20/19 1449      Initial Vocational Rehab Evaluation & Intervention   Assessment shows need for Vocational Rehabilitation  No       Education: Education Goals: Education classes will be provided on a variety of topics geared toward better understanding of heart health and risk factor modification. Participant will state understanding/return demonstration of topics presented as noted by education test scores.  Learning Barriers/Preferences: Learning Barriers/Preferences - 02/20/19 1449      Learning Barriers/Preferences   Learning Barriers  Sight   wears glasses   Learning Preferences  Individual Instruction;Verbal Instruction       Education Topics:  AED/CPR: - Group verbal and written instruction with the use of models to demonstrate the basic use of the AED with the basic ABC's of resuscitation.   General Nutrition Guidelines/Fats and Fiber: -Group instruction provided by verbal, written material, models and posters to present the general guidelines for heart healthy nutrition. Gives an explanation and review of  dietary fats and fiber.   Cardiac Rehab from 02/09/2016 in [facility] Cardiac and Pulmonary Rehab  Date  12/08/15  Educator  Jaclyn Shaggy  Instruction Review Code (retired)  2- meets goals/outcomes      Controlling Sodium/Reading Food Labels: -Group verbal and written material supporting the discussion of sodium use in heart healthy nutrition. Review and explanation with models, verbal and written materials for utilization of the food label.   Cardiac Rehab from 02/09/2016 in [facility] Cardiac and Pulmonary Rehab  Date  02/09/16  Educator  CR  Instruction Review Code (retired)  R- Review/reinforce [Second Class]      Exercise Physiology & General Exercise Guidelines: - Group verbal and written instruction with models to review the exercise physiology of the cardiovascular system and associated critical values. Provides general exercise guidelines with specific guidelines to those with heart or lung disease.    Aerobic Exercise & Resistance Training: - Gives group verbal and written instruction on the various components of exercise. Focuses on aerobic and resistive training programs and the benefits of this training and how to safely progress through these programs..   Flexibility, Balance, Mind/Body Relaxation: Provides group verbal/written instruction on the benefits of flexibility and balance training, including mind/body exercise modes such as yoga, pilates and tai chi.  Demonstration and skill practice provided.   Stress and Anxiety: - Provides group verbal and written instruction about the health risks of elevated stress and causes of high stress.  Discuss the correlation between heart/lung disease and anxiety and treatment options. Review healthy ways to manage with stress and anxiety.   Depression: - Provides group verbal and written instruction on the correlation between heart/lung disease and depressed mood, treatment options, and the stigmas associated with seeking treatment.   Cardiac Rehab  from 02/09/2016  in [facility] Cardiac and Pulmonary Rehab  Date  12/03/15  Educator  Kathreen Cornfield, [facility]  Instruction Review Code (retired)  2- meets goals/outcomes      Anatomy & Physiology of the Heart: - Group verbal and written instruction and models provide basic cardiac anatomy and physiology, with the coronary electrical and arterial systems. Review of Valvular disease and Heart Failure   Cardiac Procedures: - Group verbal and written instruction to review commonly prescribed medications for heart disease. Reviews the medication, class of the drug, and side effects. Includes the steps to properly store meds and maintain the prescription regimen. (beta blockers and nitrates)   Cardiac Medications I: - Group verbal and written instruction to review commonly prescribed medications for heart disease. Reviews the medication, class of the drug, and side effects. Includes the steps to properly store meds and maintain the prescription regimen.   Cardiac Medications II: -Group verbal and written instruction to review commonly prescribed medications for heart disease. Reviews the medication, class of the drug, and side effects. (all other drug classes)    Go Sex-Intimacy & Heart Disease, Get SMART - Goal Setting: - Group verbal and written instruction through game format to discuss heart disease and the return to sexual intimacy. Provides group verbal and written material to discuss and apply goal setting through the application of the S.M.A.R.T. Method.   Other Matters of the Heart: - Provides group verbal, written materials and models to describe Stable Angina and Peripheral Artery. Includes description of the disease process and treatment options available to the cardiac patient.   Exercise & Equipment Safety: - Individual verbal instruction and demonstration of equipment use and safety with use of the equipment.   Cardiac Rehab from 02/22/2019 in [facility] Cardiac and Pulmonary Rehab  Date   02/22/19  Educator  [facility]  Instruction Review Code  1- Verbalizes Understanding      Infection Prevention: - Provides verbal and written material to individual with discussion of infection control including proper hand washing and proper equipment cleaning during exercise session.   Cardiac Rehab from 02/22/2019 in [facility] Cardiac and Pulmonary Rehab  Date  02/22/19  Educator  [facility]  Instruction Review Code  1- Verbalizes Understanding      Falls Prevention: - Provides verbal and written material to individual with discussion of falls prevention and safety.   Cardiac Rehab from 02/22/2019 in [facility] Cardiac and Pulmonary Rehab  Date  02/22/19  Educator  [facility]  Instruction Review Code  1- Verbalizes Understanding      Diabetes: - Individual verbal and written instruction to review signs/symptoms of diabetes, desired ranges of glucose level fasting, after meals and with exercise. Acknowledge that pre and post exercise glucose checks will be done for 3 sessions at entry of program.   Cardiac Rehab from 02/22/2019 in [facility] Cardiac and Pulmonary Rehab  Date  02/22/19  Educator  [facility]  Instruction Review Code  1- Verbalizes Understanding      Know Your Numbers and Risk Factors: -Group verbal and written instruction about important numbers in your health.  Discussion of what are risk factors and how they play a role in the disease process.  Review of Cholesterol, Blood Pressure, Diabetes, and BMI and the role they play in your overall health.   Sleep Hygiene: -Provides group verbal and written instruction about how sleep can affect your health.  Define sleep hygiene, discuss sleep cycles and impact of sleep habits. Review good sleep hygiene tips.    Other: -Provides group and  verbal instruction on various topics (see comments)   Knowledge Questionnaire Score: Knowledge Questionnaire Score - 02/22/19 1057      Knowledge Questionnaire Score   Pre Score  19/26   Education Focus: Angina, Depression,  HF, Nutrition, Exercise      Core Components/Risk Factors/Patient Goals at Admission: Personal Goals and Risk Factors at Admission - 02/22/19 1057      Core Components/Risk Factors/Patient Goals on Admission    Weight Management  Yes;Obesity;Weight Loss    Intervention  Weight Management: Develop a combined nutrition and exercise program designed to reach desired caloric intake, while maintaining appropriate intake of nutrient and fiber, sodium and fats, and appropriate energy expenditure required for the weight goal.;Weight Management: Provide education and appropriate resources to help participant work on and attain dietary goals.;Weight Management/Obesity: Establish reasonable short term and long term weight goals.;Obesity: Provide education and appropriate resources to help participant work on and attain dietary goals.    Admit Weight  259 lb 9.6 oz (117.8 kg)    Goal Weight: Short Term  255 lb (115.7 kg)    Goal Weight: Long Term  249 lb (112.9 kg)    Expected Outcomes  Short Term: Continue to assess and modify interventions until short term weight is achieved;Long Term: Adherence to nutrition and physical activity/exercise program aimed toward attainment of established weight goal;Weight Loss: Understanding of general recommendations for a balanced deficit meal plan, which promotes 1-2 lb weight loss per week and includes a negative energy balance of [phone] kcal/d;Understanding recommendations for meals to include 15-35% energy as protein, 25-35% energy from fat, 35-60% energy from carbohydrates, less than 259m of dietary cholesterol, 20-35 gm of total fiber daily;Understanding of distribution of calorie intake throughout the day with the consumption of 4-5 meals/snacks    Diabetes  Yes    Intervention  Provide education about signs/symptoms and action to take for hypo/hyperglycemia.;Provide education about proper nutrition, including hydration, and aerobic/resistive exercise prescription  along with prescribed medications to achieve blood glucose in normal ranges: Fasting glucose 65-99 mg/dL    Expected Outcomes  Short Term: Participant verbalizes understanding of the signs/symptoms and immediate care of hyper/hypoglycemia, proper foot care and importance of medication, aerobic/resistive exercise and nutrition plan for blood glucose control.;Long Term: Attainment of HbA1C < 7%.    Hypertension  Yes    Intervention  Provide education on lifestyle modifcations including regular physical activity/exercise, weight management, moderate sodium restriction and increased consumption of fresh fruit, vegetables, and low fat dairy, alcohol moderation, and smoking cessation.;Monitor prescription use compliance.    Expected Outcomes  Short Term: Continued assessment and intervention until BP is < 140/964mHG in hypertensive participants. < 130/8029mG in hypertensive participants with diabetes, heart failure or chronic kidney disease.;Long Term: Maintenance of blood pressure at goal levels.    Lipids  Yes    Intervention  Provide education and support for participant on nutrition & aerobic/resistive exercise along with prescribed medications to achieve LDL <8m20mDL >40mg47m Expected Outcomes  Short Term: Participant states understanding of desired cholesterol values and is compliant with medications prescribed. Participant is following exercise prescription and nutrition guidelines.;Long Term: Cholesterol controlled with medications as prescribed, with individualized exercise RX and with personalized nutrition plan. Value goals: LDL < 8mg,23m > 40 mg.       Core Components/Risk Factors/Patient Goals Review:    Core Components/Risk Factors/Patient Goals at Discharge (Final Review):    ITP Comments: ITP Comments    Row Name 02/20/19 1452  02/22/19 1046         ITP Comments  Completed virtual orientation visit.  Documentation for diagnosis can be found in [facility] encounter 02/13/19. EP/RD appt  on 8/20.  Completed 6MWT, gym orientation, and RD evaluation. Initial ITP created and sent for review to Dr. Emily Filbert, Medical Director.         Comments: Initial ITP

## 2019-02-22 NOTE — Progress Notes (Signed)
Pt noted chest pressure 9/10 during 6MWT today.  It eased with rest to 4-5/10.  She was able to continue to do resistance training seated.  She also talked with RD after test.  Upon discharge her chest pressure was resolved.  Note routed to cardiologist for review.     02/22/19 1047  6 Minute Walk  Phase Initial  Distance 370 feet  Walk Time 2.92 minutes  # of Rest Breaks 1 (stopped due to chest pressure at 2:55)  MPH 1.44  METS 1.37  RPE 15  Perceived Dyspnea  3  VO2 Peak 4.79  Symptoms Yes (comment)  Comments SOB, chest pressure "elephant on chest" 9/10, eased with rest to 4/10, gone prior to d/c.  No EKG changes noted  Resting HR 81 bpm  Resting BP 126/64  Resting Oxygen Saturation  97 %  Exercise Oxygen Saturation  during 6 min walk 92 %  Max Ex. HR 133 bpm  Max Ex. BP 138/74  2 Minute Post BP 124/66  Interval HR  Interval Heart Rate? Yes  1 Minute HR 116  2 Minute HR 126  3 Minute HR 133  4 Minute HR 107  5 Minute HR 88  2 Minute Post HR 86  Interval Oxygen  Interval Oxygen? Yes  Baseline Oxygen Saturation % 97 %  1 Minute Oxygen Saturation % 96 %  1 Minute Liters of Oxygen 0 L (Room Air)  2 Minute Oxygen Saturation % 95 %  2 Minute Liters of Oxygen 0 L  3 Minute Oxygen Saturation % 92 %  3 Minute Liters of Oxygen 0 L  4 Minute Oxygen Saturation % 98 %  4 Minute Liters of Oxygen 0 L  5 Minute Oxygen Saturation % 98 %  5 Minute Liters of Oxygen 0 L  2 Minute Post Oxygen Saturation % 98 %  2 Minute Post Liters of Oxygen 0 L   Alberteen Sam, MA, [place], CCRP 02/22/2019 10:51 AM

## 2019-02-27 ENCOUNTER — Ambulatory Visit: Payer: Medicare Other | Admitting: Adult Health

## 2019-02-27 DIAGNOSIS — I25700 Atherosclerosis of coronary artery bypass graft(s), unspecified, with unstable angina pectoris: Secondary | ICD-10-CM | POA: Diagnosis not present

## 2019-02-28 ENCOUNTER — Other Ambulatory Visit: Payer: Self-pay

## 2019-02-28 ENCOUNTER — Encounter: Payer: Medicare Other | Admitting: *Deleted

## 2019-02-28 DIAGNOSIS — Z955 Presence of coronary angioplasty implant and graft: Secondary | ICD-10-CM

## 2019-02-28 NOTE — Progress Notes (Signed)
Incomplete Session Note  Patient Details  Name: Felicia Woods MRN: [id number] Date of Birth: [birth year] Referring Provider:     Cardiac Rehab from 02/22/2019 in [facility] Cardiac and Pulmonary Rehab  Referring Provider  Serafina Royals MD      [facility] did not complete her rehab session. She stated she has still been having unstable chest pain. She saw her doctor yesterday and she has a planned stent on 9/3. Patient instructed on what to do if she has chest pain. She will return after stent, once medically stable to start Cardiac Rehab.

## 2019-02-28 NOTE — Progress Notes (Deleted)
Daily Session Note  Patient Details  Name: Felicia Woods MRN: [id number] Date of Birth: [birth year] Referring Provider:     Cardiac Rehab from 02/22/2019 in [facility] Cardiac and Pulmonary Rehab  Referring Provider  Serafina Royals MD      Encounter Date: 02/28/2019  Check In: Session Check In - 02/28/19 1641      Check-In   Supervising physician immediately available to respond to emergencies  See telemetry face sheet for immediately available ER MD    Location  [facility]-Cardiac & Pulmonary Rehab    Staff Present  Renita Papa, RN Vickki Hearing, BA, ACSM CEP, Exercise Physiologist;Melissa Caiola RDN, LDN    Virtual Visit  No    Medication changes reported      No    Fall or balance concerns reported     No    Warm-up and Cool-down  Performed on first and last piece of equipment    Resistance Training Performed  Yes    VAD Patient?  No    PAD/SET Patient?  No      Pain Assessment   Currently in Pain?  No/denies          Social History   Tobacco Use  Smoking Status Never Smoker  Smokeless Tobacco Never Used    Goals Met:  Independence with exercise equipment Exercise tolerated well No report of cardiac concerns or symptoms Strength training completed today  Goals Unmet:  Not Applicable  Comments: First full day of exercise!  Patient was oriented to gym and equipment including functions, settings, policies, and procedures.  Patient's individual exercise prescription and treatment plan were reviewed.  All starting workloads were established based on the results of the 6 minute walk test done at initial orientation visit.  The plan for exercise progression was also introduced and progression will be customized based on patient's performance and goals.    Dr. Emily Filbert is Medical Director for [place] and [facility].

## 2019-03-01 ENCOUNTER — Other Ambulatory Visit: Payer: Self-pay

## 2019-03-01 ENCOUNTER — Emergency Department: Payer: Medicare Other

## 2019-03-01 ENCOUNTER — Emergency Department
Admission: EM | Admit: 2019-03-01 | Discharge: 2019-03-01 | Disposition: A | Discharge status: Home / Self Care | Payer: Medicare Other | Attending: Emergency Medicine | Admitting: Emergency Medicine

## 2019-03-01 ENCOUNTER — Encounter: Payer: Self-pay | Admitting: Emergency Medicine

## 2019-03-01 DIAGNOSIS — Z5321 Procedure and treatment not carried out due to patient leaving prior to being seen by health care provider: Secondary | ICD-10-CM | POA: Diagnosis not present

## 2019-03-01 DIAGNOSIS — R0602 Shortness of breath: Secondary | ICD-10-CM | POA: Diagnosis not present

## 2019-03-01 DIAGNOSIS — R079 Chest pain, unspecified: Secondary | ICD-10-CM | POA: Diagnosis not present

## 2019-03-01 LAB — CBC
HCT: 38.6 % (ref 36.0–46.0)
Hemoglobin: 12.2 g/dL (ref 12.0–15.0)
MCH: 25.4 pg — ABNORMAL LOW (ref 26.0–34.0)
MCHC: 31.6 g/dL (ref 30.0–36.0)
MCV: 80.4 fL (ref 80.0–100.0)
Platelets: 284 10*3/uL (ref 150–400)
RBC: 4.8 MIL/uL (ref 3.87–5.11)
RDW: 16 % — ABNORMAL HIGH (ref 11.5–15.5)
WBC: 9.4 10*3/uL (ref 4.0–10.5)
nRBC: 0 % (ref 0.0–0.2)

## 2019-03-01 LAB — BASIC METABOLIC PANEL
Anion gap: 11 (ref 5–15)
BUN: 14 mg/dL (ref 8–23)
CO2: 27 mmol/L (ref 22–32)
Calcium: 9.6 mg/dL (ref 8.9–10.3)
Chloride: 100 mmol/L (ref 98–111)
Creatinine, Ser: 0.84 mg/dL (ref 0.44–1.00)
GFR calc Af Amer: >60 mL/min (ref >60)
GFR calc non Af Amer: >60 mL/min (ref >60)
Glucose, Bld: 144 mg/dL — ABNORMAL HIGH (ref 70–99)
Potassium: 3.9 mmol/L (ref 3.5–5.1)
Sodium: 138 mmol/L (ref 135–145)

## 2019-03-01 LAB — TROPONIN I (HIGH SENSITIVITY): Troponin I (High Sensitivity): 13 ng/L (ref <18)

## 2019-03-01 LAB — PROTIME-INR
INR: 1 (ref 0.8–1.2)
Prothrombin Time: 13.5 seconds (ref 11.4–15.2)

## 2019-03-01 NOTE — ED Notes (Signed)
First RN Note: Pt presents to ED via wheelchair from [facility] to be seen from Dr. Alveria Apley office. Per [facility] RN pt with c/o 7/10 CP at this time, pt brought to ED to be evaluated, scheduled for stent placement on 9/3, possible eval for stent placement sooner.

## 2019-03-01 NOTE — ED Triage Notes (Addendum)
Patient states she was scheduled to have cardiac stent placed on 9/3. When she arrived for preop today, patient was increasingly short of breath and having left-sided chest pain. Patient sent to ED by Dr. Clayborn Bigness for further evaluation. Patient had previous stent placed last week.

## 2019-03-01 NOTE — ED Notes (Signed)
Pt called for room x 3, pt not visualized in ED or outside.

## 2019-03-02 ENCOUNTER — Telehealth: Payer: Self-pay | Admitting: Emergency Medicine

## 2019-03-02 NOTE — Telephone Encounter (Signed)
Called patient due to lwot to inquire about condition and follow up plans. Says she is going to cardiologist today.  I asked her to remind them to review labs done here.  She agrees.

## 2019-03-05 ENCOUNTER — Other Ambulatory Visit: Payer: Self-pay

## 2019-03-05 ENCOUNTER — Other Ambulatory Visit
Admission: RE | Admit: 2019-03-05 | Discharge: 2019-03-05 | Disposition: A | Discharge status: Home / Self Care | Payer: Medicare Other | Source: Ambulatory Visit | Attending: Internal Medicine | Admitting: Internal Medicine

## 2019-03-05 DIAGNOSIS — Z01812 Encounter for preprocedural laboratory examination: Secondary | ICD-10-CM | POA: Diagnosis not present

## 2019-03-05 DIAGNOSIS — Z20828 Contact with and (suspected) exposure to other viral communicable diseases: Secondary | ICD-10-CM | POA: Insufficient documentation

## 2019-03-05 LAB — SARS CORONAVIRUS 2 (TAT 6-24 HRS): SARS Coronavirus 2: NEGATIVE

## 2019-03-08 ENCOUNTER — Encounter
Admission: RE | Disposition: A | Discharge status: Home / Self Care | Payer: Self-pay | Source: Home / Self Care | Attending: Internal Medicine

## 2019-03-08 ENCOUNTER — Encounter: Payer: Self-pay | Admitting: *Deleted

## 2019-03-08 ENCOUNTER — Observation Stay
Admission: RE | Admit: 2019-03-08 | Discharge: 2019-03-09 | Disposition: A | Discharge status: Home / Self Care | Payer: Medicare Other | Attending: Internal Medicine | Admitting: Internal Medicine

## 2019-03-08 ENCOUNTER — Other Ambulatory Visit: Payer: Self-pay

## 2019-03-08 DIAGNOSIS — E785 Hyperlipidemia, unspecified: Secondary | ICD-10-CM | POA: Diagnosis not present

## 2019-03-08 DIAGNOSIS — Z955 Presence of coronary angioplasty implant and graft: Secondary | ICD-10-CM

## 2019-03-08 DIAGNOSIS — Z7984 Long term (current) use of oral hypoglycemic drugs: Secondary | ICD-10-CM | POA: Diagnosis not present

## 2019-03-08 DIAGNOSIS — Z79899 Other long term (current) drug therapy: Secondary | ICD-10-CM | POA: Diagnosis not present

## 2019-03-08 DIAGNOSIS — Z7982 Long term (current) use of aspirin: Secondary | ICD-10-CM | POA: Diagnosis not present

## 2019-03-08 DIAGNOSIS — I10 Essential (primary) hypertension: Secondary | ICD-10-CM | POA: Insufficient documentation

## 2019-03-08 DIAGNOSIS — J45909 Unspecified asthma, uncomplicated: Secondary | ICD-10-CM | POA: Diagnosis not present

## 2019-03-08 DIAGNOSIS — Z880 Allergy status to penicillin: Secondary | ICD-10-CM | POA: Diagnosis not present

## 2019-03-08 DIAGNOSIS — E119 Type 2 diabetes mellitus without complications: Secondary | ICD-10-CM | POA: Insufficient documentation

## 2019-03-08 DIAGNOSIS — R079 Chest pain, unspecified: Secondary | ICD-10-CM

## 2019-03-08 DIAGNOSIS — I209 Angina pectoris, unspecified: Secondary | ICD-10-CM | POA: Diagnosis not present

## 2019-03-08 DIAGNOSIS — I25110 Atherosclerotic heart disease of native coronary artery with unstable angina pectoris: Principal | ICD-10-CM | POA: Insufficient documentation

## 2019-03-08 HISTORY — PX: CORONARY STENT INTERVENTION: CATH118234

## 2019-03-08 LAB — GLUCOSE, CAPILLARY: Glucose-Capillary: 93 mg/dL (ref 70–99)

## 2019-03-08 SURGERY — CORONARY STENT INTERVENTION
Anesthesia: Moderate Sedation | Laterality: Left

## 2019-03-08 MED ORDER — SODIUM CHLORIDE 0.9 % IV SOLN
INTRAVENOUS | Status: DC | PRN
  Administered 2019-03-08: 14:00:00 1.75 mg/kg/h via INTRAVENOUS

## 2019-03-08 MED ORDER — LABETALOL HCL 5 MG/ML IV SOLN: 10.0000 mg | INTRAVENOUS | Status: AC | PRN

## 2019-03-08 MED ORDER — HYDRALAZINE HCL 20 MG/ML IJ SOLN: 10.0000 mg | INTRAMUSCULAR | Status: AC | PRN

## 2019-03-08 MED ORDER — SODIUM CHLORIDE 0.9 % WEIGHT BASED INFUSION
3.0000 mL/kg/h | INTRAVENOUS | Status: AC
  Administered 2019-03-08: 3 mL/kg/h via INTRAVENOUS

## 2019-03-08 MED ORDER — LABETALOL HCL 5 MG/ML IV SOLN: 10.0000 mg | INTRAVENOUS | Status: DC | PRN

## 2019-03-08 MED ORDER — NITROGLYCERIN IN D5W 200-5 MCG/ML-% IV SOLN: 0.0000 ug/min | INTRAVENOUS | Status: DC

## 2019-03-08 MED ORDER — CLOPIDOGREL BISULFATE 75 MG PO TABS
75.0000 mg | ORAL_TABLET | Freq: Every day | ORAL | Status: DC
  Administered 2019-03-09: 75 mg via ORAL
  Filled 2019-03-08: qty 1

## 2019-03-08 MED ORDER — ASPIRIN 81 MG PO CHEW
CHEWABLE_TABLET | ORAL | Status: AC
  Administered 2019-03-08: 81 mg
  Filled 2019-03-08: qty 1

## 2019-03-08 MED ORDER — MIDAZOLAM HCL 2 MG/2ML IJ SOLN
INTRAMUSCULAR | Status: AC
  Filled 2019-03-08: qty 2

## 2019-03-08 MED ORDER — ONDANSETRON HCL 4 MG/2ML IJ SOLN
INTRAMUSCULAR | Status: AC
  Filled 2019-03-08: qty 2

## 2019-03-08 MED ORDER — BIVALIRUDIN BOLUS VIA INFUSION - CUPID
INTRAVENOUS | Status: DC | PRN
  Administered 2019-03-08: 81.675 mg via INTRAVENOUS

## 2019-03-08 MED ORDER — FENTANYL CITRATE (PF) 100 MCG/2ML IJ SOLN
INTRAMUSCULAR | Status: AC
  Filled 2019-03-08: qty 2

## 2019-03-08 MED ORDER — ACETAMINOPHEN 325 MG PO TABS: 650.0000 mg | ORAL_TABLET | ORAL | Status: DC | PRN

## 2019-03-08 MED ORDER — CLOPIDOGREL BISULFATE 75 MG PO TABS
ORAL_TABLET | ORAL | Status: AC
  Filled 2019-03-08: qty 4

## 2019-03-08 MED ORDER — CLOPIDOGREL BISULFATE 75 MG PO TABS: 75.0000 mg | ORAL_TABLET | Freq: Every day | ORAL | Status: DC

## 2019-03-08 MED ORDER — OXYCODONE HCL 5 MG PO TABS
5.0000 mg | ORAL_TABLET | ORAL | Status: DC | PRN
  Administered 2019-03-09: 10 mg via ORAL
  Filled 2019-03-08: qty 2

## 2019-03-08 MED ORDER — ISOSORBIDE MONONITRATE ER 30 MG PO TB24
30.0000 mg | ORAL_TABLET | Freq: Every day | ORAL | Status: DC
  Administered 2019-03-08 – 2019-03-09 (×2): 30 mg via ORAL
  Filled 2019-03-08 (×2): qty 1

## 2019-03-08 MED ORDER — SODIUM CHLORIDE 0.9 % IV SOLN: 250.0000 mL | INTRAVENOUS | Status: DC | PRN

## 2019-03-08 MED ORDER — ONDANSETRON HCL 4 MG/2ML IJ SOLN: 4.0000 mg | Freq: Four times a day (QID) | INTRAMUSCULAR | Status: DC | PRN

## 2019-03-08 MED ORDER — HYDRALAZINE HCL 20 MG/ML IJ SOLN: 10.0000 mg | INTRAMUSCULAR | Status: DC | PRN

## 2019-03-08 MED ORDER — HEPARIN (PORCINE) IN NACL 1000-0.9 UT/500ML-% IV SOLN
INTRAVENOUS | Status: DC | PRN
  Administered 2019-03-08 (×2): 500 mL

## 2019-03-08 MED ORDER — SODIUM CHLORIDE 0.9% FLUSH: 3.0000 mL | Freq: Two times a day (BID) | INTRAVENOUS | Status: DC

## 2019-03-08 MED ORDER — SODIUM CHLORIDE 0.9% FLUSH: 3.0000 mL | INTRAVENOUS | Status: DC | PRN

## 2019-03-08 MED ORDER — ASPIRIN 81 MG PO CHEW: 81.0000 mg | CHEWABLE_TABLET | Freq: Every day | ORAL | Status: DC

## 2019-03-08 MED ORDER — ASPIRIN 81 MG PO CHEW
81.0000 mg | CHEWABLE_TABLET | Freq: Every day | ORAL | Status: DC
  Administered 2019-03-09: 81 mg via ORAL
  Filled 2019-03-08: qty 1

## 2019-03-08 MED ORDER — SODIUM CHLORIDE 0.9 % WEIGHT BASED INFUSION: 1.0000 mL/kg/h | INTRAVENOUS | Status: DC

## 2019-03-08 MED ORDER — ASPIRIN 81 MG PO CHEW: 81.0000 mg | CHEWABLE_TABLET | ORAL | Status: DC

## 2019-03-08 MED ORDER — NITROGLYCERIN 1 MG/10 ML FOR IR/CATH LAB
INTRA_ARTERIAL | Status: AC
  Filled 2019-03-08: qty 10

## 2019-03-08 MED ORDER — MIDAZOLAM HCL 2 MG/2ML IJ SOLN
INTRAMUSCULAR | Status: DC | PRN
  Administered 2019-03-08 (×2): 1 mg via INTRAVENOUS

## 2019-03-08 MED ORDER — FENTANYL CITRATE (PF) 100 MCG/2ML IJ SOLN
INTRAMUSCULAR | Status: DC | PRN
  Administered 2019-03-08 (×3): 50 ug via INTRAVENOUS

## 2019-03-08 MED ORDER — BIVALIRUDIN TRIFLUOROACETATE 250 MG IV SOLR
INTRAVENOUS | Status: AC
  Filled 2019-03-08: qty 250

## 2019-03-08 MED ORDER — IOHEXOL 300 MG/ML  SOLN
INTRAMUSCULAR | Status: DC | PRN
  Administered 2019-03-08: 235 mL

## 2019-03-08 MED ORDER — SODIUM CHLORIDE 0.9 % WEIGHT BASED INFUSION
1.0000 mL/kg/h | INTRAVENOUS | Status: AC
  Administered 2019-03-08: 1 mL/kg/h via INTRAVENOUS

## 2019-03-08 MED ORDER — ONDANSETRON HCL 4 MG/2ML IJ SOLN
4.0000 mg | Freq: Once | INTRAMUSCULAR | Status: AC
  Administered 2019-03-08: 4 mg via INTRAVENOUS

## 2019-03-08 MED ORDER — CLOPIDOGREL BISULFATE 300 MG PO TABS
300.0000 mg | ORAL_TABLET | Freq: Every day | ORAL | Status: DC
  Administered 2019-03-08: 300 mg via ORAL

## 2019-03-08 MED ORDER — HEPARIN (PORCINE) IN NACL 1000-0.9 UT/500ML-% IV SOLN
INTRAVENOUS | Status: AC
  Filled 2019-03-08: qty 1000

## 2019-03-08 MED ORDER — HYDROMORPHONE HCL 1 MG/ML IJ SOLN
1.0000 mg | Freq: Once | INTRAMUSCULAR | Status: AC
  Administered 2019-03-08: 1 mg via INTRAVENOUS

## 2019-03-08 SURGICAL SUPPLY — 14 items
BALLN EUPHORA RX 3.0X20 (BALLOONS) ×2
BALLOON EUPHORA RX 3.0X20 (BALLOONS) ×1 IMPLANT
CATH INFINITI 5 FR MPA2 (CATHETERS) ×2 IMPLANT
CATH INFINITI JR4 5F (CATHETERS) ×2 IMPLANT
CATH VISTA GUIDE 6FR JR4 SH (CATHETERS) ×2 IMPLANT
DEVICE CLOSURE MYNXGRIP 6/7F (Vascular Products) ×2 IMPLANT
DEVICE INFLAT 30 PLUS (MISCELLANEOUS) ×2 IMPLANT
KIT MANI 3VAL PERCEP (MISCELLANEOUS) ×2 IMPLANT
NEEDLE PERC 18GX7CM (NEEDLE) ×2 IMPLANT
PACK CARDIAC CATH (CUSTOM PROCEDURE TRAY) ×2 IMPLANT
SHEATH AVANTI 6FR X 11CM (SHEATH) ×2 IMPLANT
STENT SYNERGY DES 3X20 (Permanent Stent) ×2 IMPLANT
WIRE G HI TQ BMW 190 (WIRE) ×2 IMPLANT
WIRE GUIDERIGHT .035X150 (WIRE) ×2 IMPLANT

## 2019-03-08 NOTE — Plan of Care (Signed)
  Problem: Activity: Goal: Ability to return to baseline activity level will improve Outcome: Progressing   Problem: Cardiovascular: Goal: Ability to achieve and maintain adequate cardiovascular perfusion will improve Outcome: Progressing Goal: Vascular access site(s) Level 0-1 will be maintained Outcome: Progressing   

## 2019-03-09 ENCOUNTER — Encounter: Payer: Self-pay | Admitting: Internal Medicine

## 2019-03-09 DIAGNOSIS — Z955 Presence of coronary angioplasty implant and graft: Secondary | ICD-10-CM | POA: Diagnosis not present

## 2019-03-09 DIAGNOSIS — I97190 Other postprocedural cardiac functional disturbances following cardiac surgery: Secondary | ICD-10-CM | POA: Diagnosis not present

## 2019-03-09 DIAGNOSIS — J45909 Unspecified asthma, uncomplicated: Secondary | ICD-10-CM | POA: Diagnosis not present

## 2019-03-09 DIAGNOSIS — I25110 Atherosclerotic heart disease of native coronary artery with unstable angina pectoris: Secondary | ICD-10-CM | POA: Diagnosis not present

## 2019-03-09 DIAGNOSIS — E119 Type 2 diabetes mellitus without complications: Secondary | ICD-10-CM | POA: Diagnosis not present

## 2019-03-09 DIAGNOSIS — I10 Essential (primary) hypertension: Secondary | ICD-10-CM | POA: Diagnosis not present

## 2019-03-09 DIAGNOSIS — E785 Hyperlipidemia, unspecified: Secondary | ICD-10-CM | POA: Diagnosis not present

## 2019-03-09 LAB — POCT ACTIVATED CLOTTING TIME: Activated Clotting Time: 378 seconds

## 2019-03-09 MED ORDER — ISOSORBIDE MONONITRATE ER 30 MG PO TB24: 30.0000 mg | ORAL_TABLET | Freq: Every day | ORAL | 4 refills | Status: DC

## 2019-03-09 NOTE — Progress Notes (Signed)
Went over discharge instructions with the patient including medications and follow-up appointment. Discontinue PIV and telemetry monitor. NT will help patient for transport.

## 2019-03-09 NOTE — Progress Notes (Signed)
Subjective:  Patient doing somewhat better postop day 1 from PCI and stent to SVG to OM hopefully be able to discharge today  Objective:  Vital Signs in the last 24 hours: Temp:  [97.4 F (36.3 C)-98.4 F (36.9 C)] 98.4 F (36.9 C) (09/04 0810) Pulse Rate:  [60-87] 69 (09/04 0810) Resp:  [17-23] 19 (09/04 0810) BP: (119-161)/(72-101) 127/88 (09/04 0810) SpO2:  [92 %-100 %] 100 % (09/04 0810) Weight:  [108.9 kg-118.9 kg] 117 kg (09/04 0336)  Intake/Output from previous day: 09/03 0701 - 09/04 0700 In: -  Out: 400 [Urine:400] Intake/Output from this shift: Total I/O In: 240 [P.O.:240] Out: -   Physical Exam: General appearance: appears stated age Neck: no adenopathy, no carotid bruit, no JVD, supple, symmetrical, trachea midline and thyroid not enlarged, symmetric, no tenderness/mass/nodules Lungs: clear to auscultation bilaterally Heart: regular rate and rhythm, S1, S2 normal, no murmur, click, rub or gallop Abdomen: soft, non-tender; bowel sounds normal; no masses,  no organomegaly Extremities: extremities normal, atraumatic, no cyanosis or edema Pulses: 2+ and symmetric Skin: Skin color, texture, turgor normal. No rashes or lesions Neurologic: Alert and oriented X 3, normal strength and tone. Normal symmetric reflexes. Normal coordination and gait  Lab Results: No results for input(s): WBC, HGB, PLT in the last 72 hours. No results for input(s): NA, K, CL, CO2, GLUCOSE, BUN, CREATININE in the last 72 hours. No results for input(s): TROPONINI in the last 72 hours.  Invalid input(s): CK, MB Hepatic Function Panel No results for input(s): PROT, ALBUMIN, AST, ALT, ALKPHOS, BILITOT, BILIDIR, IBILI in the last 72 hours. No results for input(s): CHOL in the last 72 hours. No results for input(s): PROTIME in the last 72 hours.  Imaging: Imaging results have been reviewed  Cardiac Studies:  Assessment/Plan:  Angina CABG Chest Pain Coronary Artery Disease Coronary  Artery Stent Shortness of Breath  Hyper lipidemia Status post PCI and stent DES Successful subacute closure of intervene vein graft to RCA . Plan Continue aspirin Plavix Maintain hyperlipidemia management Recommend exercise weight loss Add Imdur therapy for angina Consider increasing dose or adding Ranexa Hopefully discharge home today Follow-up with cardiology as an outpatient 1 to 2 weeks   LOS: 0 days    Erique Kaser D Kaitlyn Skowron 03/09/2019, 11:10 AM

## 2019-03-09 NOTE — Progress Notes (Signed)
Cardiovascular and Pulmonary Nurse Navigator Note:    Patient presented yesterday for Specials for scheduled outpatient Cardiac Cath for planned PCI with stent to vein graft to RCA. Patient had recent PCI to of vein graft to RCA requiring multiple stents.  Patient being discharged on the following cardiac medications:  Aspirin, Atorvastatin, bisoprolol-hydrochlorothiazide, Plavix, Lasix, Imdur, Losartan, Nitroglycerin SL, Zocor.    Patient stated she was able to walk in the hallway this a.m. without SOB or chest pain.  Patient already enrolled in Cardiac Rehab program and is aware she will be able to resume participating when Cardiologist says it is alright to resume. Patient to discuss this at her follow-up appointment with her cardiologist.    Roanna Epley, RN, BSN, [place] Cardiac & Pulmonary Rehab  Cardiovascular & Pulmonary Nurse Navigator  Direct Line: [phone]  Department Phone #: [phone] Fax: [phone]  Email Address: [email or web address]

## 2019-03-09 NOTE — Discharge Summary (Signed)
Physician Discharge Summary  Patient ID: Felicia Woods MRN: [id number] DOB/AGE: 1955-10-23 63 y.o.  Admit date: 03/08/2019 Discharge date: 03/09/2019  Admission Diagnoses:  Discharge Diagnoses:  Active Problems:   S/P drug eluting coronary stent placement   Discharged Condition: stable  Hospital Course: Patient admitted outpatient for planned PCI and stent vein graft to circumflex.  Had recently had PCI of vein graft to RCA requiring multiple stents by Dr. Marjory Lies about 3 weeks ago unfortunately during the procedure we realized that the vein graft was subsequently closed with TIMI 0 flow in my opinion that probably represents for worsening symptoms not a forementioned circumflex to the vein graft to circumflex we will went ahead and treated the circumflex and advance her medication but I am concerned that her symptoms may or may not improve  Consults: None  Significant Diagnostic Studies: angiography: Patient found to have new occlusion subacute vein graft to RCA multivessel coronary disease  Treatments: PCI stent circumflex vein graft addition of Imdur for symptom management  Discharge Exam: Blood pressure 127/88, pulse 69, temperature 98.4 F (36.9 C), temperature source Oral, resp. rate 19, height 5\' 7"  (1.702 m), weight 117 kg, SpO2 100 %. General appearance: appears older than stated age Resp: clear to auscultation bilaterally Cardio: regular rate and rhythm, S1, S2 normal, no murmur, click, rub or gallop GI: soft, non-tender; bowel sounds normal; no masses,  no organomegaly Extremities: extremities normal, atraumatic, no cyanosis or edema Pulses: 2+ and symmetric Neurologic: Alert and oriented X 3, normal strength and tone. Normal symmetric reflexes. Normal coordination and gait  Disposition: Status post PCI and stent we will add Imdur to help with symptom control since subacute closure of vein graft to circumflex is probably the reason she is having worsening symptoms.  Vein  graft or the native arteries not amenable to intervention  Discharge Instructions    AMB Referral to Cardiac Rehabilitation - Phase II   Complete by: As directed    Diagnosis: Coronary Stents   After initial evaluation and assessments completed: Virtual Based Care may be provided alone or in conjunction with Phase 2 Cardiac Rehab based on patient barriers.: Yes     Allergies as of 03/09/2019      Reactions   Penicillin G Anaphylaxis, Other (See Comments)   Has patient had a PCN reaction causing immediate rash, facial/tongue/throat swelling, SOB or lightheadedness with hypotension: [id number] Has patient had a PCN reaction causing severe rash involving mucus membranes or skin necrosis: no:[id number]} Has patient had a PCN reaction that required hospitalization no:[id number]} Has patient had a PCN reaction occurring within the last 10 years: no:[id number]} If all of the above answers are "NO", then may proceed with Cephalosporin use.      Medication List    TAKE these medications   Accu-Chek Softclix Lancets lancets Use as instructed once  DAILY DIAG -E11.9   albuterol 108 (90 Base) MCG/ACT inhaler Commonly known as: VENTOLIN HFA Inhale 2 puffs into the lungs every 6 (six) hours as needed for wheezing or shortness of breath.   amLODipine 10 MG tablet Commonly known as: NORVASC Take 1 tablet (10 mg total) by mouth daily.   aspirin EC 81 MG tablet Take 81 mg by mouth daily.   atorvastatin 20 MG tablet Commonly known as: LIPITOR Take 1 tablet (20 mg total) by mouth daily.   bisoprolol-hydrochlorothiazide 5-6.25 MG tablet Commonly known as: ZIAC Take 1 tablet by mouth daily. What changed: Another medication with the same name was removed. Continue taking this  medication, and follow the directions you see here.   clopidogrel 75 MG tablet Commonly known as: PLAVIX Take 1 tablet (75 mg total) by mouth daily.   cyclobenzaprine 10 MG tablet Commonly known as: FLEXERIL Take 1 tablet  (10 mg total) by mouth 3 (three) times daily as needed for muscle spasms. What changed: when to take this   estradiol 0.5 MG tablet Commonly known as: ESTRACE Take 1 tablet (0.5 mg total) by mouth daily.   furosemide 40 MG tablet Commonly known as: LASIX Take 1 tablet (40 mg total) by mouth 2 (two) times daily.   gabapentin 300 MG capsule Commonly known as: NEURONTIN Take 300 mg by mouth 3 (three) times daily.   glimepiride 1 MG tablet Commonly known as: AMARYL Take 1 tablet (1 mg total) by mouth daily with breakfast.   glucose blood test strip Commonly known as: Accu-Chek Aviva Plus 1 each by Other route as needed for other. Use as instructed once  DAILY DIAG -E11.9   isosorbide mononitrate 30 MG 24 hr tablet Commonly known as: IMDUR Take 30 mg by mouth daily. What changed: Another medication with the same name was added. Make sure you understand how and when to take each.   isosorbide mononitrate 30 MG 24 hr tablet Commonly known as: IMDUR Take 1 tablet (30 mg total) by mouth daily. Start taking on: March 10, 2019 What changed: You were already taking a medication with the same name, and this prescription was added. Make sure you understand how and when to take each.   losartan 100 MG tablet Commonly known as: COZAAR Take one tab po qd What changed:   how much to take  how to take this  when to take this  Another medication with the same name was removed. Continue taking this medication, and follow the directions you see here.   montelukast 10 MG tablet Commonly known as: SINGULAIR Take 1 tablet (10 mg total) by mouth daily. What changed: when to take this   nitroGLYCERIN 0.4 MG SL tablet Commonly known as: NITROSTAT Place 0.4 mg under the tongue every 5 (five) minutes x 3 doses as needed for chest pain. *If no relief call MD or go to Emergency Room*   oxybutynin 5 MG tablet Commonly known as: DITROPAN Take 1 tablet (5 mg total) by mouth daily after  supper. What changed: Another medication with the same name was removed. Continue taking this medication, and follow the directions you see here.   sertraline 100 MG tablet Commonly known as: ZOLOFT Take 1 tablet (100 mg total) by mouth daily. What changed: when to take this   simvastatin 40 MG tablet Commonly known as: ZOCOR Take 40 mg by mouth daily.   Trulicity 1.5 0000000 Sopn Generic drug: Dulaglutide Inject 1.5 mg into the skin once a week. What changed:   when to take this  Another medication with the same name was removed. Continue taking this medication, and follow the directions you see here.   zolpidem 10 MG tablet Commonly known as: AMBIEN Take 1 tablet (10 mg total) by mouth at bedtime.      Follow-up Information    [facility] Cardiac and Pulmonary Rehab Follow up.   Specialty: Cardiac Rehabilitation Why: Given your new stent your Cardiologist has referred you to outpatient Cardiac Rehab.  You are already enrolled in the program and will continue participating when Dr. Clayborn Bigness says it is alright to resume.   Contact information: [place] [id number] [facility] [place]  [place] [id number] [phone]          Signed: Yolonda Kida 03/09/2019, 11:16 AM

## 2019-03-14 ENCOUNTER — Telehealth: Payer: Self-pay | Admitting: *Deleted

## 2019-03-14 ENCOUNTER — Encounter: Payer: Self-pay | Admitting: *Deleted

## 2019-03-14 DIAGNOSIS — Z955 Presence of coronary angioplasty implant and graft: Secondary | ICD-10-CM

## 2019-03-14 NOTE — Telephone Encounter (Signed)
Called pt to check up on her. She is feeling better since her stent.  She has her follow up on 9/16 to discuss returning to rehab.

## 2019-03-14 NOTE — Progress Notes (Signed)
Cardiac Individual Treatment Plan  Patient Details  Name: Felicia Woods MRN: [id number] Date of Birth: 03-Dec-1955 Referring Provider:     Cardiac Rehab from 02/22/2019 in [facility] Cardiac and Pulmonary Rehab  Referring Provider  Serafina Royals MD      Initial Encounter Date:    Cardiac Rehab from 02/22/2019 in [facility] Cardiac and Pulmonary Rehab  Date  02/22/19      Visit Diagnosis: S/P coronary artery stent placement  Patient's Home Medications on Admission:  Current Outpatient Medications:  .  ACCU-CHEK SOFTCLIX LANCETS lancets, Use as instructed once  DAILY DIAG -E11.9, Disp: 100 each, Rfl: 3 .  albuterol (PROVENTIL HFA;VENTOLIN HFA) 108 (90 Base) MCG/ACT inhaler, Inhale 2 puffs into the lungs every 6 (six) hours as needed for wheezing or shortness of breath., Disp: , Rfl:  .  amLODipine (NORVASC) 10 MG tablet, Take 1 tablet (10 mg total) by mouth daily., Disp: 90 tablet, Rfl: 1 .  aspirin EC 81 MG tablet, Take 81 mg by mouth daily., Disp: , Rfl:  .  atorvastatin (LIPITOR) 20 MG tablet, Take 1 tablet (20 mg total) by mouth daily. (Patient not taking: Reported on 03/05/2019), Disp: 90 tablet, Rfl: 3 .  bisoprolol-hydrochlorothiazide (ZIAC) 5-6.25 MG tablet, Take 1 tablet by mouth daily. (Patient not taking: Reported on 03/05/2019), Disp: 90 tablet, Rfl: 2 .  clopidogrel (PLAVIX) 75 MG tablet, Take 1 tablet (75 mg total) by mouth daily., Disp: 90 tablet, Rfl: 3 .  cyclobenzaprine (FLEXERIL) 10 MG tablet, Take 1 tablet (10 mg total) by mouth 3 (three) times daily as needed for muscle spasms. (Patient taking differently: Take 10 mg by mouth 2 (two) times daily. ), Disp: 30 tablet, Rfl: 1 .  Dulaglutide (TRULICITY) 1.5 PJ/8.2NK SOPN, Inject 1.5 mg into the skin once a week. (Patient taking differently: Inject 1.5 mg into the skin every Thursday. ), Disp: 12 pen, Rfl: 2 .  estradiol (ESTRACE) 0.5 MG tablet, Take 1 tablet (0.5 mg total) by mouth daily. (Patient not taking: Reported on 03/05/2019),  Disp: 90 tablet, Rfl: 1 .  furosemide (LASIX) 40 MG tablet, Take 1 tablet (40 mg total) by mouth 2 (two) times daily., Disp: 180 tablet, Rfl: 1 .  gabapentin (NEURONTIN) 300 MG capsule, Take 300 mg by mouth 3 (three) times daily., Disp: , Rfl:  .  glimepiride (AMARYL) 1 MG tablet, Take 1 tablet (1 mg total) by mouth daily with breakfast., Disp: 90 tablet, Rfl: 2 .  glucose blood (ACCU-CHEK AVIVA PLUS) test strip, 1 each by Other route as needed for other. Use as instructed once  DAILY DIAG -E11.9, Disp: 100 each, Rfl: 3 .  isosorbide mononitrate (IMDUR) 30 MG 24 hr tablet, Take 30 mg by mouth daily., Disp: , Rfl:  .  isosorbide mononitrate (IMDUR) 30 MG 24 hr tablet, Take 1 tablet (30 mg total) by mouth daily., Disp: 90 tablet, Rfl: 4 .  losartan (COZAAR) 100 MG tablet, Take one tab po qd (Patient taking differently: Take 100 mg by mouth daily. Take one tab po qd), Disp: 90 tablet, Rfl: 3 .  montelukast (SINGULAIR) 10 MG tablet, Take 1 tablet (10 mg total) by mouth daily. (Patient taking differently: Take 10 mg by mouth at bedtime. ), Disp: 90 tablet, Rfl: 1 .  nitroGLYCERIN (NITROSTAT) 0.4 MG SL tablet, Place 0.4 mg under the tongue every 5 (five) minutes x 3 doses as needed for chest pain. *If no relief call MD or go to Emergency Room*, Disp: , Rfl:  .  oxybutynin (DITROPAN) 5 MG tablet, Take 1 tablet (5 mg total) by mouth daily after supper. (Patient not taking: Reported on 03/05/2019), Disp: 90 tablet, Rfl: 1 .  sertraline (ZOLOFT) 100 MG tablet, Take 1 tablet (100 mg total) by mouth daily. (Patient taking differently: Take 100 mg by mouth at bedtime. ), Disp: 90 tablet, Rfl: 3 .  simvastatin (ZOCOR) 40 MG tablet, Take 40 mg by mouth daily., Disp: , Rfl:  .  zolpidem (AMBIEN) 10 MG tablet, Take 1 tablet (10 mg total) by mouth at bedtime., Disp: 90 tablet, Rfl: 1  Past Medical History: Past Medical History:  Diagnosis Date  . Asthma   . Coronary artery disease   . Diabetes mellitus without  complication ([place])   . Hyperlipidemia   . Hypertension   . MI (myocardial infarction) ([place])   . Migraine headache with aura   . 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    Tobacco Use: Social History   Tobacco Use  Smoking Status Never Smoker  Smokeless Tobacco Never Used    Labs: Recent Review Flowsheet Data    Labs for ITP Cardiac and Pulmonary Rehab Latest Ref Rng & Units 07/29/2017 01/11/2018 05/01/2018 09/08/2018 01/18/2019   Cholestrol 0 - 200 mg/dL - - - - -   LDLCALC 0 - 99 mg/dL - - - - -   HDL >40 mg/dL - - - - -   Trlycerides <150 mg/dL - - - - -   Hemoglobin A1c 4.0 - 5.6 % 6.5 6.7(A) 6.6(A) 6.4(A) 6.6(A)   TCO2 0 - 100 mmol/L - - - - -       Exercise Target Goals: Exercise Program Goal: Individual exercise prescription set using results from initial 6 min walk test and THRR while considering  patient's activity barriers and safety.   Exercise Prescription Goal: Initial exercise prescription builds to 30-45 minutes a day of aerobic activity, 2-3 days per week.  Home exercise guidelines will be given to patient during program as part of exercise prescription that the participant will acknowledge.  Activity Barriers & Risk Stratification: Activity Barriers & Cardiac Risk Stratification - 02/20/19 1450      Activity Barriers & Cardiac Risk Stratification   Activity Barriers  Deconditioning;Muscular Weakness;Shortness of Breath;Back Problems;Joint Problems;Arthritis;Chest Pain/Angina   R knee pain   Cardiac Risk Stratification  High       6 Minute Walk: 6 Minute Walk    Row Name 02/22/19 1047         6 Minute Walk   Phase  Initial     Distance  370 feet     Walk Time  2.92 minutes     # of Rest Breaks  1 stopped due to chest pressure at 2:55     MPH  1.44     METS  1.37     RPE  15     Perceived Dyspnea   3     VO2 Peak  4.79     Symptoms  Yes (comment)     Comments  SOB, chest pressure "elephant on chest" 9/10, eased with rest to 4/10, gone prior to d/c.   No EKG changes noted     Resting HR  81 bpm     Resting BP  126/64     Resting Oxygen Saturation   97 %     Exercise Oxygen Saturation  during 6 min walk  92 %     Max Ex. HR  133 bpm  Max Ex. BP  138/74     2 Minute Post BP  124/66       Interval HR   1 Minute HR  116     2 Minute HR  126     3 Minute HR  133     4 Minute HR  107     5 Minute HR  88     2 Minute Post HR  86     Interval Heart Rate?  Yes       Interval Oxygen   Interval Oxygen?  Yes     Baseline Oxygen Saturation %  97 %     1 Minute Oxygen Saturation %  96 %     1 Minute Liters of Oxygen  0 L Room Air     2 Minute Oxygen Saturation %  95 %     2 Minute Liters of Oxygen  0 L     3 Minute Oxygen Saturation %  92 %     3 Minute Liters of Oxygen  0 L     4 Minute Oxygen Saturation %  98 %     4 Minute Liters of Oxygen  0 L     5 Minute Oxygen Saturation %  98 %     5 Minute Liters of Oxygen  0 L     2 Minute Post Oxygen Saturation %  98 %     2 Minute Post Liters of Oxygen  0 L        Oxygen Initial Assessment:   Oxygen Re-Evaluation:   Oxygen Discharge (Final Oxygen Re-Evaluation):   Initial Exercise Prescription: Initial Exercise Prescription - 02/22/19 1000      Date of Initial Exercise RX and Referring Provider   Date  02/22/19    Referring Provider  Serafina Royals MD      Treadmill   MPH  1.4    Grade  0    Minutes  15    METs  2.07      NuStep   Level  1    SPM  80    Minutes  15    METs  2      Elliptical   Level  1    Speed  2.5    Minutes  15      Prescription Details   Frequency (times per week)  3    Duration  Progress to 30 minutes of continuous aerobic without signs/symptoms of physical distress      Intensity   THRR 40-80% of Max Heartrate  112-143    Ratings of Perceived Exertion  11-13    Perceived Dyspnea  0-4      Progression   Progression  Continue to progress workloads to maintain intensity without signs/symptoms of physical distress.       Resistance Training   Training Prescription  Yes    Weight  3 lbs    Reps  10-15       Perform Capillary Blood Glucose checks as needed.  Exercise Prescription Changes: Exercise Prescription Changes    Row Name 02/22/19 1000             Response to Exercise   Blood Pressure (Admit)  126/64       Blood Pressure (Exercise)  138/74       Blood Pressure (Exit)  124/66       Heart Rate (Admit)  81 bpm       Heart  Rate (Exercise)  133 bpm       Heart Rate (Exit)  86 bpm       Oxygen Saturation (Admit)  97 %       Oxygen Saturation (Exercise)  92 %       Oxygen Saturation (Exit)  98 %       Rating of Perceived Exertion (Exercise)  15       Perceived Dyspnea (Exercise)  3       Symptoms  chest pressure 9/10, SOB       Comments  walk test results          Exercise Comments:   Exercise Goals and Review: Exercise Goals    Row Name 02/22/19 1055             Exercise Goals   Increase Physical Activity  Yes       Intervention  Provide advice, education, support and counseling about physical activity/exercise needs.;Develop an individualized exercise prescription for aerobic and resistive training based on initial evaluation findings, risk stratification, comorbidities and participant's personal goals.       Expected Outcomes  Short Term: Attend rehab on a regular basis to increase amount of physical activity.;Long Term: Add in home exercise to make exercise part of routine and to increase amount of physical activity.;Long Term: Exercising regularly at least 3-5 days a week.       Increase Strength and Stamina  Yes       Intervention  Provide advice, education, support and counseling about physical activity/exercise needs.;Develop an individualized exercise prescription for aerobic and resistive training based on initial evaluation findings, risk stratification, comorbidities and participant's personal goals.       Expected Outcomes  Short Term: Increase workloads from initial  exercise prescription for resistance, speed, and METs.;Short Term: Perform resistance training exercises routinely during rehab and add in resistance training at home;Long Term: Improve cardiorespiratory fitness, muscular endurance and strength as measured by increased METs and functional capacity (6MWT)       Able to understand and use rate of perceived exertion (RPE) scale  Yes       Intervention  Provide education and explanation on how to use RPE scale       Expected Outcomes  Short Term: Able to use RPE daily in rehab to express subjective intensity level;Long Term:  Able to use RPE to guide intensity level when exercising independently       Able to understand and use Dyspnea scale  Yes       Intervention  Provide education and explanation on how to use Dyspnea scale       Expected Outcomes  Short Term: Able to use Dyspnea scale daily in rehab to express subjective sense of shortness of breath during exertion;Long Term: Able to use Dyspnea scale to guide intensity level when exercising independently       Knowledge and understanding of Target Heart Rate Range (THRR)  Yes       Intervention  Provide education and explanation of THRR including how the numbers were predicted and where they are located for reference       Expected Outcomes  Short Term: Able to state/look up THRR;Short Term: Able to use daily as guideline for intensity in rehab;Long Term: Able to use THRR to govern intensity when exercising independently       Able to check pulse independently  Yes       Intervention  Provide education and demonstration on how to  check pulse in carotid and radial arteries.;Review the importance of being able to check your own pulse for safety during independent exercise       Expected Outcomes  Short Term: Able to explain why pulse checking is important during independent exercise;Long Term: Able to check pulse independently and accurately       Understanding of Exercise Prescription  Yes        Intervention  Provide education, explanation, and written materials on patient's individual exercise prescription       Expected Outcomes  Short Term: Able to explain program exercise prescription;Long Term: Able to explain home exercise prescription to exercise independently          Exercise Goals Re-Evaluation : Exercise Goals Re-Evaluation    Row Name 02/28/19 1642             Exercise Goal Re-Evaluation   Exercise Goals Review  -       Comments  -       Expected Outcomes  -          Discharge Exercise Prescription (Final Exercise Prescription Changes): Exercise Prescription Changes - 02/22/19 1000      Response to Exercise   Blood Pressure (Admit)  126/64    Blood Pressure (Exercise)  138/74    Blood Pressure (Exit)  124/66    Heart Rate (Admit)  81 bpm    Heart Rate (Exercise)  133 bpm    Heart Rate (Exit)  86 bpm    Oxygen Saturation (Admit)  97 %    Oxygen Saturation (Exercise)  92 %    Oxygen Saturation (Exit)  98 %    Rating of Perceived Exertion (Exercise)  15    Perceived Dyspnea (Exercise)  3    Symptoms  chest pressure 9/10, SOB    Comments  walk test results       Nutrition:  Target Goals: Understanding of nutrition guidelines, daily intake of sodium <1553m, cholesterol <2049m calories 30% from fat and 7% or less from saturated fats, daily to have 5 or more servings of fruits and vegetables.  Biometrics: Pre Biometrics - 02/22/19 1056      Pre Biometrics   Height  5' 6.6" (1.692 m)    Weight  259 lb 9.6 oz (117.8 kg)    BMI (Calculated)  41.13    Single Leg Stand  27 seconds        Nutrition Therapy Plan and Nutrition Goals: Nutrition Therapy & Goals - 02/22/19 1037      Nutrition Therapy   Diet  Low Na, HH, DM diet    Drug/Food Interactions  Statins/Certain Fruits    Protein (specify units)  95-100g    Fiber  25 grams    Whole Grain Foods  3 servings    Saturated Fats  12 max. grams    Fruits and Vegetables  5 servings/day    Sodium   1.5 grams      Personal Nutrition Goals   Nutrition Goal  ST: review information and think about a short term goal LT: not come back to rehab    Comments  Pt reports not wanting to come back to rehab another time after this round. Pt reports T2DM is doing well last A1C 6.4. Pt reports for breakfast having OJ and fruit or OJ and cereal w/ whole milk and toast or oatmeal and fruit with OJ. Pt reports for lunch will havbe tu[place]wich with some fruit. For dinner pt will have meat,  vegetables (loves greens), and grains. Pt reports having honey wheat bread and white rice when choosing grains. Pt reports mid afternoon snack to be graham  crackers and night snack to be sugar free ice cream, graham crackers or vanilla waffers. Pt reports having 2 eggs once a week, cheese 2x/week, chicken/fish 2-3x/week, pork chops or steak 2-3x/week, butter 2-3x/week, packaged dinners 2x/week, and not eating fried foods. Discussed small changes, HH and DM eating and fluid retention with Na.      Intervention Plan   Intervention  Prescribe, educate and counsel regarding individualized specific dietary modifications aiming towards targeted core components such as weight, hypertension, lipid management, diabetes, heart failure and other comorbidities.;Nutrition handout(s) given to patient.    Expected Outcomes  Short Term Goal: Understand basic principles of dietary content, such as calories, fat, sodium, cholesterol and nutrients.;Long Term Goal: Adherence to prescribed nutrition plan.;Short Term Goal: A plan has been developed with personal nutrition goals set during dietitian appointment.       Nutrition Assessments: Nutrition Assessments - 02/22/19 1051      MEDFICTS Scores   Pre Score  45       Nutrition Goals Re-Evaluation:   Nutrition Goals Discharge (Final Nutrition Goals Re-Evaluation):   Psychosocial: Target Goals: Acknowledge presence or absence of significant depression and/or stress, maximize coping  skills, provide positive support system. Participant is able to verbalize types and ability to use techniques and skills needed for reducing stress and depression.   Initial Review & Psychosocial Screening: Initial Psych Review & Screening - 02/20/19 1440      Initial Review   Current issues with  History of Depression;Current Stress Concerns;Current Sleep Concerns    Comments  Hx Depression when father died 2 months, takes Ambien for sleep and sleeps well with it      [place]?  Yes    Concerns  Recent loss of significant other   see above note   Comments  Fiance is support system      Barriers   Psychosocial barriers to participate in program  Psychosocial barriers identified (see note);The patient should benefit from training in stress management and relaxation.      Screening Interventions   Interventions  Encouraged to exercise;To provide support and resources with identified psychosocial needs;Program counselor consult;Provide feedback about the scores to participant    Expected Outcomes  Short Term goal: Utilizing psychosocial counselor, staff and physician to assist with identification of specific Stressors or current issues interfering with healing process. Setting desired goal for each stressor or current issue identified.;Long Term Goal: Stressors or current issues are controlled or eliminated.;Short Term goal: Identification and review with participant of any Quality of Life or Depression concerns found by scoring the questionnaire.;Long Term goal: The participant improves quality of Life and PHQ9 Scores as seen by post scores and/or verbalization of changes       Quality of Life Scores:  Quality of Life - 02/22/19 1056      Quality of Life   Select  Quality of Life      Quality of Life Scores   Health/Function Pre  21.73 %    Socioeconomic Pre  26.58 %    Psych/Spiritual Pre  30 %    Family Pre  27.6 %    GLOBAL Pre  25.26 %      Scores  of 19 and below usually indicate a poorer quality of life in these areas.  A difference of  2-3  points is a clinically meaningful difference.  A difference of 2-3 points in the total score of the Quality of Life Index has been associated with significant improvement in overall quality of life, self-image, physical symptoms, and general health in studies assessing change in quality of life.  PHQ-9: Recent Review Flowsheet Data    Depression screen [facility] 2/9 02/22/2019 01/18/2019 09/08/2018 06/12/2018 04/03/2018   Decreased Interest 0 0 0 0 0   Down, Depressed, Hopeless 0 0 0 0 0   PHQ - 2 Score 0 0 0 0 0   Altered sleeping 2 - - - -   Tired, decreased energy 3 - - - -   Change in appetite 0 - - - -   Feeling bad or failure about yourself  0 - - - -   Trouble concentrating 0 - - - -   Moving slowly or fidgety/restless 2 - - - -   Suicidal thoughts 0 - - - -   PHQ-9 Score 7 - - - -   Difficult doing work/chores Somewhat difficult - - - -     Interpretation of Total Score  Total Score Depression Severity:  1-4 = Minimal depression, 5-9 = Mild depression, 10-14 = Moderate depression, 15-19 = Moderately severe depression, 20-27 = Severe depression   Psychosocial Evaluation and Intervention:   Psychosocial Re-Evaluation:   Psychosocial Discharge (Final Psychosocial Re-Evaluation):   Vocational Rehabilitation: Provide vocational rehab assistance to qualifying candidates.   Vocational Rehab Evaluation & Intervention: Vocational Rehab - 02/20/19 1449      Initial Vocational Rehab Evaluation & Intervention   Assessment shows need for Vocational Rehabilitation  No       Education: Education Goals: Education classes will be provided on a variety of topics geared toward better understanding of heart health and risk factor modification. Participant will state understanding/return demonstration of topics presented as noted by education test scores.  Learning Barriers/Preferences: Learning  Barriers/Preferences - 02/20/19 1449      Learning Barriers/Preferences   Learning Barriers  Sight   wears glasses   Learning Preferences  Individual Instruction;Verbal Instruction       Education Topics:  AED/CPR: - Group verbal and written instruction with the use of models to demonstrate the basic use of the AED with the basic ABC's of resuscitation.   General Nutrition Guidelines/Fats and Fiber: -Group instruction provided by verbal, written material, models and posters to present the general guidelines for heart healthy nutrition. Gives an explanation and review of dietary fats and fiber.   Cardiac Rehab from 02/09/2016 in [facility] Cardiac and Pulmonary Rehab  Date  12/08/15  Educator  Jaclyn Shaggy  Instruction Review Code (retired)  2- meets goals/outcomes      Controlling Sodium/Reading Food Labels: -Group verbal and written material supporting the discussion of sodium use in heart healthy nutrition. Review and explanation with models, verbal and written materials for utilization of the food label.   Cardiac Rehab from 02/09/2016 in [facility] Cardiac and Pulmonary Rehab  Date  02/09/16  Educator  CR  Instruction Review Code (retired)  R- Review/reinforce [Second Class]      Exercise Physiology & General Exercise Guidelines: - Group verbal and written instruction with models to review the exercise physiology of the cardiovascular system and associated critical values. Provides general exercise guidelines with specific guidelines to those with heart or lung disease.    Aerobic Exercise & Resistance Training: - Gives group verbal and written instruction on the various components of exercise. Focuses on aerobic  and resistive training programs and the benefits of this training and how to safely progress through these programs..   Flexibility, Balance, Mind/Body Relaxation: Provides group verbal/written instruction on the benefits of flexibility and balance training, including mind/body  exercise modes such as yoga, pilates and tai chi.  Demonstration and skill practice provided.   Stress and Anxiety: - Provides group verbal and written instruction about the health risks of elevated stress and causes of high stress.  Discuss the correlation between heart/lung disease and anxiety and treatment options. Review healthy ways to manage with stress and anxiety.   Depression: - Provides group verbal and written instruction on the correlation between heart/lung disease and depressed mood, treatment options, and the stigmas associated with seeking treatment.   Cardiac Rehab from 02/09/2016 in [facility] Cardiac and Pulmonary Rehab  Date  12/03/15  Educator  Kathreen Cornfield, [facility]  Instruction Review Code (retired)  2- meets goals/outcomes      Anatomy & Physiology of the Heart: - Group verbal and written instruction and models provide basic cardiac anatomy and physiology, with the coronary electrical and arterial systems. Review of Valvular disease and Heart Failure   Cardiac Procedures: - Group verbal and written instruction to review commonly prescribed medications for heart disease. Reviews the medication, class of the drug, and side effects. Includes the steps to properly store meds and maintain the prescription regimen. (beta blockers and nitrates)   Cardiac Medications I: - Group verbal and written instruction to review commonly prescribed medications for heart disease. Reviews the medication, class of the drug, and side effects. Includes the steps to properly store meds and maintain the prescription regimen.   Cardiac Medications II: -Group verbal and written instruction to review commonly prescribed medications for heart disease. Reviews the medication, class of the drug, and side effects. (all other drug classes)    Go Sex-Intimacy & Heart Disease, Get SMART - Goal Setting: - Group verbal and written instruction through game format to discuss heart disease and the return to sexual  intimacy. Provides group verbal and written material to discuss and apply goal setting through the application of the S.M.A.R.T. Method.   Other Matters of the Heart: - Provides group verbal, written materials and models to describe Stable Angina and Peripheral Artery. Includes description of the disease process and treatment options available to the cardiac patient.   Exercise & Equipment Safety: - Individual verbal instruction and demonstration of equipment use and safety with use of the equipment.   Cardiac Rehab from 02/22/2019 in [facility] Cardiac and Pulmonary Rehab  Date  02/22/19  Educator  [facility]  Instruction Review Code  1- Verbalizes Understanding      Infection Prevention: - Provides verbal and written material to individual with discussion of infection control including proper hand washing and proper equipment cleaning during exercise session.   Cardiac Rehab from 02/22/2019 in [facility] Cardiac and Pulmonary Rehab  Date  02/22/19  Educator  [facility]  Instruction Review Code  1- Verbalizes Understanding      Falls Prevention: - Provides verbal and written material to individual with discussion of falls prevention and safety.   Cardiac Rehab from 02/22/2019 in [facility] Rehab  Date  02/22/19  Educator  [facility]  Instruction Review Code  1- Verbalizes Understanding      Diabetes: - Individual verbal and written instruction to review signs/symptoms of diabetes, desired ranges of glucose level fasting, after meals and with exercise. Acknowledge that pre and post exercise glucose checks will be done  for 3 sessions at entry of program.   Cardiac Rehab from 02/22/2019 in [facility] Cardiac and Pulmonary Rehab  Date  02/22/19  Educator  [facility]  Instruction Review Code  1- Verbalizes Understanding      Know Your Numbers and Risk Factors: -Group verbal and written instruction about important numbers in your health.  Discussion of what are risk factors and how they play a role in the disease  process.  Review of Cholesterol, Blood Pressure, Diabetes, and BMI and the role they play in your overall health.   Sleep Hygiene: -Provides group verbal and written instruction about how sleep can affect your health.  Define sleep hygiene, discuss sleep cycles and impact of sleep habits. Review good sleep hygiene tips.    Other: -Provides group and verbal instruction on various topics (see comments)   Knowledge Questionnaire Score: Knowledge Questionnaire Score - 02/22/19 1057      Knowledge Questionnaire Score   Pre Score  19/26   Education Focus: Angina, Depression, HF, Nutrition, Exercise      Core Components/Risk Factors/Patient Goals at Admission: Personal Goals and Risk Factors at Admission - 02/22/19 1057      Core Components/Risk Factors/Patient Goals on Admission    Weight Management  Yes;Obesity;Weight Loss    Intervention  Weight Management: Develop a combined nutrition and exercise program designed to reach desired caloric intake, while maintaining appropriate intake of nutrient and fiber, sodium and fats, and appropriate energy expenditure required for the weight goal.;Weight Management: Provide education and appropriate resources to help participant work on and attain dietary goals.;Weight Management/Obesity: Establish reasonable short term and long term weight goals.;Obesity: Provide education and appropriate resources to help participant work on and attain dietary goals.    Admit Weight  259 lb 9.6 oz (117.8 kg)    Goal Weight: Short Term  255 lb (115.7 kg)    Goal Weight: Long Term  249 lb (112.9 kg)    Expected Outcomes  Short Term: Continue to assess and modify interventions until short term weight is achieved;Long Term: Adherence to nutrition and physical activity/exercise program aimed toward attainment of established weight goal;Weight Loss: Understanding of general recommendations for a balanced deficit meal plan, which promotes 1-2 lb weight loss per week and  includes a negative energy balance of [phone] kcal/d;Understanding recommendations for meals to include 15-35% energy as protein, 25-35% energy from fat, 35-60% energy from carbohydrates, less than 233m of dietary cholesterol, 20-35 gm of total fiber daily;Understanding of distribution of calorie intake throughout the day with the consumption of 4-5 meals/snacks    Diabetes  Yes    Intervention  Provide education about signs/symptoms and action to take for hypo/hyperglycemia.;Provide education about proper nutrition, including hydration, and aerobic/resistive exercise prescription along with prescribed medications to achieve blood glucose in normal ranges: Fasting glucose 65-99 mg/dL    Expected Outcomes  Short Term: Participant verbalizes understanding of the signs/symptoms and immediate care of hyper/hypoglycemia, proper foot care and importance of medication, aerobic/resistive exercise and nutrition plan for blood glucose control.;Long Term: Attainment of HbA1C < 7%.    Hypertension  Yes    Intervention  Provide education on lifestyle modifcations including regular physical activity/exercise, weight management, moderate sodium restriction and increased consumption of fresh fruit, vegetables, and low fat dairy, alcohol moderation, and smoking cessation.;Monitor prescription use compliance.    Expected Outcomes  Short Term: Continued assessment and intervention until BP is < 140/967mHG in hypertensive participants. < 130/8048mG in hypertensive participants with diabetes, heart failure or  chronic kidney disease.;Long Term: Maintenance of blood pressure at goal levels.    Lipids  Yes    Intervention  Provide education and support for participant on nutrition & aerobic/resistive exercise along with prescribed medications to achieve LDL <48m, HDL >451m    Expected Outcomes  Short Term: Participant states understanding of desired cholesterol values and is compliant with medications prescribed. Participant  is following exercise prescription and nutrition guidelines.;Long Term: Cholesterol controlled with medications as prescribed, with individualized exercise RX and with personalized nutrition plan. Value goals: LDL < 7054mHDL > 40 mg.       Core Components/Risk Factors/Patient Goals Review:    Core Components/Risk Factors/Patient Goals at Discharge (Final Review):    ITP Comments: ITP Comments    Row Name 02/20/19 1452 02/22/19 1046 02/28/19 1642 03/14/19 0726     ITP Comments  Completed virtual orientation visit.  [facility] EP/RD appt on 8/20.  Completed 6MWT, gym orientation, and RD evaluation. Initial ITP created and sent for review to Dr. MarEmily Filbertedical Director.  -  30 Day Review Completed today. Continue with ITP unless changed by Medical Director review.  New to program       Comments:

## 2019-03-21 DIAGNOSIS — I25810 Atherosclerosis of coronary artery bypass graft(s) without angina pectoris: Secondary | ICD-10-CM | POA: Diagnosis not present

## 2019-03-21 DIAGNOSIS — I10 Essential (primary) hypertension: Secondary | ICD-10-CM | POA: Diagnosis not present

## 2019-03-22 ENCOUNTER — Encounter: Payer: Medicare Other | Attending: Internal Medicine

## 2019-03-22 DIAGNOSIS — I10 Essential (primary) hypertension: Secondary | ICD-10-CM | POA: Insufficient documentation

## 2019-03-22 DIAGNOSIS — I252 Old myocardial infarction: Secondary | ICD-10-CM | POA: Insufficient documentation

## 2019-03-22 DIAGNOSIS — Z955 Presence of coronary angioplasty implant and graft: Secondary | ICD-10-CM | POA: Insufficient documentation

## 2019-03-22 DIAGNOSIS — J45909 Unspecified asthma, uncomplicated: Secondary | ICD-10-CM | POA: Insufficient documentation

## 2019-03-22 DIAGNOSIS — I2510 Atherosclerotic heart disease of native coronary artery without angina pectoris: Secondary | ICD-10-CM | POA: Insufficient documentation

## 2019-03-22 DIAGNOSIS — Z79899 Other long term (current) drug therapy: Secondary | ICD-10-CM | POA: Insufficient documentation

## 2019-03-22 DIAGNOSIS — Z7902 Long term (current) use of antithrombotics/antiplatelets: Secondary | ICD-10-CM | POA: Insufficient documentation

## 2019-03-22 DIAGNOSIS — Z7982 Long term (current) use of aspirin: Secondary | ICD-10-CM | POA: Insufficient documentation

## 2019-03-22 DIAGNOSIS — E785 Hyperlipidemia, unspecified: Secondary | ICD-10-CM | POA: Insufficient documentation

## 2019-03-22 DIAGNOSIS — E119 Type 2 diabetes mellitus without complications: Secondary | ICD-10-CM | POA: Insufficient documentation

## 2019-03-23 ENCOUNTER — Encounter: Payer: Self-pay | Admitting: *Deleted

## 2019-03-23 ENCOUNTER — Telehealth: Payer: Self-pay | Admitting: *Deleted

## 2019-03-23 DIAGNOSIS — Z955 Presence of coronary angioplasty implant and graft: Secondary | ICD-10-CM

## 2019-03-23 NOTE — Telephone Encounter (Signed)
Called pt to let her know that her doctor cleared her to return to rehab.  However, her daughter is re-uping in the service and she will be watching her grandchild while her daughter settles into new routine.  Due to her schedule she will not be able to attend.  She will call us when things calm and she is ready to re-enroll and will start on visit 2.  She has a new referral from the most recent stent already. Discharge ITP sent.

## 2019-03-23 NOTE — Progress Notes (Signed)
Cardiac Individual Treatment Plan  Patient Details  Name: Felicia Woods MRN: [id number] Date of Birth: May 23, 1956 Referring Provider:     Cardiac Rehab from 02/22/2019 in [facility] Cardiac and Pulmonary Rehab  Referring Provider  Serafina Royals MD      Initial Encounter Date:    Cardiac Rehab from 02/22/2019 in [facility] Cardiac and Pulmonary Rehab  Date  02/22/19      Visit Diagnosis: S/P coronary artery stent placement  Patient's Home Medications on Admission:  Current Outpatient Medications:  .  ACCU-CHEK SOFTCLIX LANCETS lancets, Use as instructed once  DAILY DIAG -E11.9, Disp: 100 each, Rfl: 3 .  albuterol (PROVENTIL HFA;VENTOLIN HFA) 108 (90 Base) MCG/ACT inhaler, Inhale 2 puffs into the lungs every 6 (six) hours as needed for wheezing or shortness of breath., Disp: , Rfl:  .  amLODipine (NORVASC) 10 MG tablet, Take 1 tablet (10 mg total) by mouth daily., Disp: 90 tablet, Rfl: 1 .  aspirin EC 81 MG tablet, Take 81 mg by mouth daily., Disp: , Rfl:  .  atorvastatin (LIPITOR) 20 MG tablet, Take 1 tablet (20 mg total) by mouth daily. (Patient not taking: Reported on 03/05/2019), Disp: 90 tablet, Rfl: 3 .  bisoprolol-hydrochlorothiazide (ZIAC) 5-6.25 MG tablet, Take 1 tablet by mouth daily. (Patient not taking: Reported on 03/05/2019), Disp: 90 tablet, Rfl: 2 .  clopidogrel (PLAVIX) 75 MG tablet, Take 1 tablet (75 mg total) by mouth daily., Disp: 90 tablet, Rfl: 3 .  cyclobenzaprine (FLEXERIL) 10 MG tablet, Take 1 tablet (10 mg total) by mouth 3 (three) times daily as needed for muscle spasms. (Patient taking differently: Take 10 mg by mouth 2 (two) times daily. ), Disp: 30 tablet, Rfl: 1 .  Dulaglutide (TRULICITY) 1.5 YQ/8.2NO SOPN, Inject 1.5 mg into the skin once a week. (Patient taking differently: Inject 1.5 mg into the skin every Thursday. ), Disp: 12 pen, Rfl: 2 .  estradiol (ESTRACE) 0.5 MG tablet, Take 1 tablet (0.5 mg total) by mouth daily. (Patient not taking: Reported on 03/05/2019),  Disp: 90 tablet, Rfl: 1 .  furosemide (LASIX) 40 MG tablet, Take 1 tablet (40 mg total) by mouth 2 (two) times daily., Disp: 180 tablet, Rfl: 1 .  gabapentin (NEURONTIN) 300 MG capsule, Take 300 mg by mouth 3 (three) times daily., Disp: , Rfl:  .  glimepiride (AMARYL) 1 MG tablet, Take 1 tablet (1 mg total) by mouth daily with breakfast., Disp: 90 tablet, Rfl: 2 .  glucose blood (ACCU-CHEK AVIVA PLUS) test strip, 1 each by Other route as needed for other. Use as instructed once  DAILY DIAG -E11.9, Disp: 100 each, Rfl: 3 .  isosorbide mononitrate (IMDUR) 30 MG 24 hr tablet, Take 30 mg by mouth daily., Disp: , Rfl:  .  isosorbide mononitrate (IMDUR) 30 MG 24 hr tablet, Take 1 tablet (30 mg total) by mouth daily., Disp: 90 tablet, Rfl: 4 .  losartan (COZAAR) 100 MG tablet, Take one tab po qd (Patient taking differently: Take 100 mg by mouth daily. Take one tab po qd), Disp: 90 tablet, Rfl: 3 .  montelukast (SINGULAIR) 10 MG tablet, Take 1 tablet (10 mg total) by mouth daily. (Patient taking differently: Take 10 mg by mouth at bedtime. ), Disp: 90 tablet, Rfl: 1 .  nitroGLYCERIN (NITROSTAT) 0.4 MG SL tablet, Place 0.4 mg under the tongue every 5 (five) minutes x 3 doses as needed for chest pain. *If no relief call MD or go to Emergency Room*, Disp: , Rfl:  .  oxybutynin (DITROPAN) 5 MG tablet, Take 1 tablet (5 mg total) by mouth daily after supper. (Patient not taking: Reported on 03/05/2019), Disp: 90 tablet, Rfl: 1 .  sertraline (ZOLOFT) 100 MG tablet, Take 1 tablet (100 mg total) by mouth daily. (Patient taking differently: Take 100 mg by mouth at bedtime. ), Disp: 90 tablet, Rfl: 3 .  simvastatin (ZOCOR) 40 MG tablet, Take 40 mg by mouth daily., Disp: , Rfl:  .  zolpidem (AMBIEN) 10 MG tablet, Take 1 tablet (10 mg total) by mouth at bedtime., Disp: 90 tablet, Rfl: 1  Past Medical History: Past Medical History:  Diagnosis Date  . Asthma   . Coronary artery disease   . Diabetes mellitus without  complication ([place])   . Hyperlipidemia   . Hypertension   . MI (myocardial infarction) ([place])   . Migraine headache with aura   . 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    Tobacco Use: Social History   Tobacco Use  Smoking Status Never Smoker  Smokeless Tobacco Never Used    Labs: Recent Review Flowsheet Data    Labs for ITP Cardiac and Pulmonary Rehab Latest Ref Rng & Units 07/29/2017 01/11/2018 05/01/2018 09/08/2018 01/18/2019   Cholestrol 0 - 200 mg/dL - - - - -   LDLCALC 0 - 99 mg/dL - - - - -   HDL >40 mg/dL - - - - -   Trlycerides <150 mg/dL - - - - -   Hemoglobin A1c 4.0 - 5.6 % 6.5 6.7(A) 6.6(A) 6.4(A) 6.6(A)   TCO2 0 - 100 mmol/L - - - - -       Exercise Target Goals: Exercise Program Goal: Individual exercise prescription set using results from initial 6 min walk test and THRR while considering  patient's activity barriers and safety.   Exercise Prescription Goal: Initial exercise prescription builds to 30-45 minutes a day of aerobic activity, 2-3 days per week.  Home exercise guidelines will be given to patient during program as part of exercise prescription that the participant will acknowledge.  Activity Barriers & Risk Stratification: Activity Barriers & Cardiac Risk Stratification - 02/20/19 1450      Activity Barriers & Cardiac Risk Stratification   Activity Barriers  Deconditioning;Muscular Weakness;Shortness of Breath;Back Problems;Joint Problems;Arthritis;Chest Pain/Angina   R knee pain   Cardiac Risk Stratification  High       6 Minute Walk: 6 Minute Walk    Row Name 02/22/19 1047         6 Minute Walk   Phase  Initial     Distance  370 feet     Walk Time  2.92 minutes     # of Rest Breaks  1 stopped due to chest pressure at 2:55     MPH  1.44     METS  1.37     RPE  15     Perceived Dyspnea   3     VO2 Peak  4.79     Symptoms  Yes (comment)     Comments  SOB, chest pressure "elephant on chest" 9/10, eased with rest to 4/10, gone prior to d/c.   No EKG changes noted     Resting HR  81 bpm     Resting BP  126/64     Resting Oxygen Saturation   97 %     Exercise Oxygen Saturation  during 6 min walk  92 %     Max Ex. HR  133 bpm  Max Ex. BP  138/74     2 Minute Post BP  124/66       Interval HR   1 Minute HR  116     2 Minute HR  126     3 Minute HR  133     4 Minute HR  107     5 Minute HR  88     2 Minute Post HR  86     Interval Heart Rate?  Yes       Interval Oxygen   Interval Oxygen?  Yes     Baseline Oxygen Saturation %  97 %     1 Minute Oxygen Saturation %  96 %     1 Minute Liters of Oxygen  0 L Room Air     2 Minute Oxygen Saturation %  95 %     2 Minute Liters of Oxygen  0 L     3 Minute Oxygen Saturation %  92 %     3 Minute Liters of Oxygen  0 L     4 Minute Oxygen Saturation %  98 %     4 Minute Liters of Oxygen  0 L     5 Minute Oxygen Saturation %  98 %     5 Minute Liters of Oxygen  0 L     2 Minute Post Oxygen Saturation %  98 %     2 Minute Post Liters of Oxygen  0 L        Oxygen Initial Assessment:   Oxygen Re-Evaluation:   Oxygen Discharge (Final Oxygen Re-Evaluation):   Initial Exercise Prescription: Initial Exercise Prescription - 02/22/19 1000      Date of Initial Exercise RX and Referring Provider   Date  02/22/19    Referring Provider  Serafina Royals MD      Treadmill   MPH  1.4    Grade  0    Minutes  15    METs  2.07      NuStep   Level  1    SPM  80    Minutes  15    METs  2      Elliptical   Level  1    Speed  2.5    Minutes  15      Prescription Details   Frequency (times per week)  3    Duration  Progress to 30 minutes of continuous aerobic without signs/symptoms of physical distress      Intensity   THRR 40-80% of Max Heartrate  112-143    Ratings of Perceived Exertion  11-13    Perceived Dyspnea  0-4      Progression   Progression  Continue to progress workloads to maintain intensity without signs/symptoms of physical distress.       Resistance Training   Training Prescription  Yes    Weight  3 lbs    Reps  10-15       Perform Capillary Blood Glucose checks as needed.  Exercise Prescription Changes: Exercise Prescription Changes    Row Name 02/22/19 1000             Response to Exercise   Blood Pressure (Admit)  126/64       Blood Pressure (Exercise)  138/74       Blood Pressure (Exit)  124/66       Heart Rate (Admit)  81 bpm       Heart  Rate (Exercise)  133 bpm       Heart Rate (Exit)  86 bpm       Oxygen Saturation (Admit)  97 %       Oxygen Saturation (Exercise)  92 %       Oxygen Saturation (Exit)  98 %       Rating of Perceived Exertion (Exercise)  15       Perceived Dyspnea (Exercise)  3       Symptoms  chest pressure 9/10, SOB       Comments  walk test results          Exercise Comments:   Exercise Goals and Review: Exercise Goals    Row Name 02/22/19 1055             Exercise Goals   Increase Physical Activity  Yes       Intervention  Provide advice, education, support and counseling about physical activity/exercise needs.;Develop an individualized exercise prescription for aerobic and resistive training based on initial evaluation findings, risk stratification, comorbidities and participant's personal goals.       Expected Outcomes  Short Term: Attend rehab on a regular basis to increase amount of physical activity.;Long Term: Add in home exercise to make exercise part of routine and to increase amount of physical activity.;Long Term: Exercising regularly at least 3-5 days a week.       Increase Strength and Stamina  Yes       Intervention  Provide advice, education, support and counseling about physical activity/exercise needs.;Develop an individualized exercise prescription for aerobic and resistive training based on initial evaluation findings, risk stratification, comorbidities and participant's personal goals.       Expected Outcomes  Short Term: Increase workloads from initial  exercise prescription for resistance, speed, and METs.;Short Term: Perform resistance training exercises routinely during rehab and add in resistance training at home;Long Term: Improve cardiorespiratory fitness, muscular endurance and strength as measured by increased METs and functional capacity (6MWT)       Able to understand and use rate of perceived exertion (RPE) scale  Yes       Intervention  Provide education and explanation on how to use RPE scale       Expected Outcomes  Short Term: Able to use RPE daily in rehab to express subjective intensity level;Long Term:  Able to use RPE to guide intensity level when exercising independently       Able to understand and use Dyspnea scale  Yes       Intervention  Provide education and explanation on how to use Dyspnea scale       Expected Outcomes  Short Term: Able to use Dyspnea scale daily in rehab to express subjective sense of shortness of breath during exertion;Long Term: Able to use Dyspnea scale to guide intensity level when exercising independently       Knowledge and understanding of Target Heart Rate Range (THRR)  Yes       Intervention  Provide education and explanation of THRR including how the numbers were predicted and where they are located for reference       Expected Outcomes  Short Term: Able to state/look up THRR;Short Term: Able to use daily as guideline for intensity in rehab;Long Term: Able to use THRR to govern intensity when exercising independently       Able to check pulse independently  Yes       Intervention  Provide education and demonstration on how to  check pulse in carotid and radial arteries.;Review the importance of being able to check your own pulse for safety during independent exercise       Expected Outcomes  Short Term: Able to explain why pulse checking is important during independent exercise;Long Term: Able to check pulse independently and accurately       Understanding of Exercise Prescription  Yes        Intervention  Provide education, explanation, and written materials on patient's individual exercise prescription       Expected Outcomes  Short Term: Able to explain program exercise prescription;Long Term: Able to explain home exercise prescription to exercise independently          Exercise Goals Re-Evaluation : Exercise Goals Re-Evaluation    Row Name 02/28/19 1642             Exercise Goal Re-Evaluation   Exercise Goals Review  -       Comments  -       Expected Outcomes  -          Discharge Exercise Prescription (Final Exercise Prescription Changes): Exercise Prescription Changes - 02/22/19 1000      Response to Exercise   Blood Pressure (Admit)  126/64    Blood Pressure (Exercise)  138/74    Blood Pressure (Exit)  124/66    Heart Rate (Admit)  81 bpm    Heart Rate (Exercise)  133 bpm    Heart Rate (Exit)  86 bpm    Oxygen Saturation (Admit)  97 %    Oxygen Saturation (Exercise)  92 %    Oxygen Saturation (Exit)  98 %    Rating of Perceived Exertion (Exercise)  15    Perceived Dyspnea (Exercise)  3    Symptoms  chest pressure 9/10, SOB    Comments  walk test results       Nutrition:  Target Goals: Understanding of nutrition guidelines, daily intake of sodium <1533m, cholesterol <20108m calories 30% from fat and 7% or less from saturated fats, daily to have 5 or more servings of fruits and vegetables.  Biometrics: Pre Biometrics - 02/22/19 1056      Pre Biometrics   Height  5' 6.6" (1.692 m)    Weight  259 lb 9.6 oz (117.8 kg)    BMI (Calculated)  41.13    Single Leg Stand  27 seconds        Nutrition Therapy Plan and Nutrition Goals: Nutrition Therapy & Goals - 02/22/19 1037      Nutrition Therapy   Diet  Low Na, HH, DM diet    Drug/Food Interactions  Statins/Certain Fruits    Protein (specify units)  95-100g    Fiber  25 grams    Whole Grain Foods  3 servings    Saturated Fats  12 max. grams    Fruits and Vegetables  5 servings/day    Sodium   1.5 grams      Personal Nutrition Goals   Nutrition Goal  ST: review information and think about a short term goal LT: not come back to rehab    Comments  Pt reports not wanting to come back to rehab another time after this round. Pt reports T2DM is doing well last A1C 6.4. Pt reports for breakfast having OJ and fruit or OJ and cereal w/ whole milk and toast or oatmeal and fruit with OJ. Pt reports for lunch will havbe tu[place] with some fruit. For dinner pt will have meat,  vegetables (loves greens), and grains. Pt reports having honey wheat bread and white rice when choosing grains. Pt reports mid afternoon snack to be graham  crackers and night snack to be sugar free ice cream, graham crackers or vanilla waffers. Pt reports having 2 eggs once a week, cheese 2x/week, chicken/fish 2-3x/week, pork chops or steak 2-3x/week, butter 2-3x/week, packaged dinners 2x/week, and not eating fried foods. Discussed small changes, HH and DM eating and fluid retention with Na.      Intervention Plan   Intervention  Prescribe, educate and counsel regarding individualized specific dietary modifications aiming towards targeted core components such as weight, hypertension, lipid management, diabetes, heart failure and other comorbidities.;Nutrition handout(s) given to patient.    Expected Outcomes  Short Term Goal: Understand basic principles of dietary content, such as calories, fat, sodium, cholesterol and nutrients.;Long Term Goal: Adherence to prescribed nutrition plan.;Short Term Goal: A plan has been developed with personal nutrition goals set during dietitian appointment.       Nutrition Assessments: Nutrition Assessments - 02/22/19 1051      MEDFICTS Scores   Pre Score  45       Nutrition Goals Re-Evaluation:   Nutrition Goals Discharge (Final Nutrition Goals Re-Evaluation):   Psychosocial: Target Goals: Acknowledge presence or absence of significant depression and/or stress, maximize coping  skills, provide positive support system. Participant is able to verbalize types and ability to use techniques and skills needed for reducing stress and depression.   Initial Review & Psychosocial Screening: Initial Psych Review & Screening - 02/20/19 1440      Initial Review   Current issues with  History of Depression;Current Stress Concerns;Current Sleep Concerns    Comments  Hx Depression when father died 56 months, takes Ambien for sleep and sleeps well with it      [place]?  Yes    Concerns  Recent loss of significant other   see above note   Comments  Fiance is support system      Barriers   Psychosocial barriers to participate in program  Psychosocial barriers identified (see note);The patient should benefit from training in stress management and relaxation.      Screening Interventions   Interventions  Encouraged to exercise;To provide support and resources with identified psychosocial needs;Program counselor consult;Provide feedback about the scores to participant    Expected Outcomes  Short Term goal: Utilizing psychosocial counselor, staff and physician to assist with identification of specific Stressors or current issues interfering with healing process. Setting desired goal for each stressor or current issue identified.;Long Term Goal: Stressors or current issues are controlled or eliminated.;Short Term goal: Identification and review with participant of any Quality of Life or Depression concerns found by scoring the questionnaire.;Long Term goal: The participant improves quality of Life and PHQ9 Scores as seen by post scores and/or verbalization of changes       Quality of Life Scores:  Quality of Life - 02/22/19 1056      Quality of Life   Select  Quality of Life      Quality of Life Scores   Health/Function Pre  21.73 %    Socioeconomic Pre  26.58 %    Psych/Spiritual Pre  30 %    Family Pre  27.6 %    GLOBAL Pre  25.26 %      Scores  of 19 and below usually indicate a poorer quality of life in these areas.  A difference of  2-3  points is a clinically meaningful difference.  A difference of 2-3 points in the total score of the Quality of Life Index has been associated with significant improvement in overall quality of life, self-image, physical symptoms, and general health in studies assessing change in quality of life.  PHQ-9: Recent Review Flowsheet Data    Depression screen [facility] 2/9 02/22/2019 01/18/2019 09/08/2018 06/12/2018 04/03/2018   Decreased Interest 0 0 0 0 0   Down, Depressed, Hopeless 0 0 0 0 0   PHQ - 2 Score 0 0 0 0 0   Altered sleeping 2 - - - -   Tired, decreased energy 3 - - - -   Change in appetite 0 - - - -   Feeling bad or failure about yourself  0 - - - -   Trouble concentrating 0 - - - -   Moving slowly or fidgety/restless 2 - - - -   Suicidal thoughts 0 - - - -   PHQ-9 Score 7 - - - -   Difficult doing work/chores Somewhat difficult - - - -     Interpretation of Total Score  Total Score Depression Severity:  1-4 = Minimal depression, 5-9 = Mild depression, 10-14 = Moderate depression, 15-19 = Moderately severe depression, 20-27 = Severe depression   Psychosocial Evaluation and Intervention:   Psychosocial Re-Evaluation:   Psychosocial Discharge (Final Psychosocial Re-Evaluation):   Vocational Rehabilitation: Provide vocational rehab assistance to qualifying candidates.   Vocational Rehab Evaluation & Intervention: Vocational Rehab - 02/20/19 1449      Initial Vocational Rehab Evaluation & Intervention   Assessment shows need for Vocational Rehabilitation  No       Education: Education Goals: Education classes will be provided on a variety of topics geared toward better understanding of heart health and risk factor modification. Participant will state understanding/return demonstration of topics presented as noted by education test scores.  Learning Barriers/Preferences: Learning  Barriers/Preferences - 02/20/19 1449      Learning Barriers/Preferences   Learning Barriers  Sight   wears glasses   Learning Preferences  Individual Instruction;Verbal Instruction       Education Topics:  AED/CPR: - Group verbal and written instruction with the use of models to demonstrate the basic use of the AED with the basic ABC's of resuscitation.   General Nutrition Guidelines/Fats and Fiber: -Group instruction provided by verbal, written material, models and posters to present the general guidelines for heart healthy nutrition. Gives an explanation and review of dietary fats and fiber.   Cardiac Rehab from 02/09/2016 in [facility] Cardiac and Pulmonary Rehab  Date  12/08/15  Educator  Jaclyn Shaggy  Instruction Review Code (retired)  2- meets goals/outcomes      Controlling Sodium/Reading Food Labels: -Group verbal and written material supporting the discussion of sodium use in heart healthy nutrition. Review and explanation with models, verbal and written materials for utilization of the food label.   Cardiac Rehab from 02/09/2016 in [facility] Cardiac and Pulmonary Rehab  Date  02/09/16  Educator  CR  Instruction Review Code (retired)  R- Review/reinforce [Second Class]      Exercise Physiology & General Exercise Guidelines: - Group verbal and written instruction with models to review the exercise physiology of the cardiovascular system and associated critical values. Provides general exercise guidelines with specific guidelines to those with heart or lung disease.    Aerobic Exercise & Resistance Training: - Gives group verbal and written instruction on the various components of exercise. Focuses on aerobic  and resistive training programs and the benefits of this training and how to safely progress through these programs..   Flexibility, Balance, Mind/Body Relaxation: Provides group verbal/written instruction on the benefits of flexibility and balance training, including mind/body  exercise modes such as yoga, pilates and tai chi.  Demonstration and skill practice provided.   Stress and Anxiety: - Provides group verbal and written instruction about the health risks of elevated stress and causes of high stress.  Discuss the correlation between heart/lung disease and anxiety and treatment options. Review healthy ways to manage with stress and anxiety.   Depression: - Provides group verbal and written instruction on the correlation between heart/lung disease and depressed mood, treatment options, and the stigmas associated with seeking treatment.   Cardiac Rehab from 02/09/2016 in [facility] Cardiac and Pulmonary Rehab  Date  12/03/15  Educator  Kathreen Cornfield, [facility]  Instruction Review Code (retired)  2- meets goals/outcomes      Anatomy & Physiology of the Heart: - Group verbal and written instruction and models provide basic cardiac anatomy and physiology, with the coronary electrical and arterial systems. Review of Valvular disease and Heart Failure   Cardiac Procedures: - Group verbal and written instruction to review commonly prescribed medications for heart disease. Reviews the medication, class of the drug, and side effects. Includes the steps to properly store meds and maintain the prescription regimen. (beta blockers and nitrates)   Cardiac Medications I: - Group verbal and written instruction to review commonly prescribed medications for heart disease. Reviews the medication, class of the drug, and side effects. Includes the steps to properly store meds and maintain the prescription regimen.   Cardiac Medications II: -Group verbal and written instruction to review commonly prescribed medications for heart disease. Reviews the medication, class of the drug, and side effects. (all other drug classes)    Go Sex-Intimacy & Heart Disease, Get SMART - Goal Setting: - Group verbal and written instruction through game format to discuss heart disease and the return to sexual  intimacy. Provides group verbal and written material to discuss and apply goal setting through the application of the S.M.A.R.T. Method.   Other Matters of the Heart: - Provides group verbal, written materials and models to describe Stable Angina and Peripheral Artery. Includes description of the disease process and treatment options available to the cardiac patient.   Exercise & Equipment Safety: - Individual verbal instruction and demonstration of equipment use and safety with use of the equipment.   Cardiac Rehab from 02/22/2019 in [facility] Cardiac and Pulmonary Rehab  Date  02/22/19  Educator  [facility]  Instruction Review Code  1- Verbalizes Understanding      Infection Prevention: - Provides verbal and written material to individual with discussion of infection control including proper hand washing and proper equipment cleaning during exercise session.   Cardiac Rehab from 02/22/2019 in [facility] Cardiac and Pulmonary Rehab  Date  02/22/19  Educator  [facility]  Instruction Review Code  1- Verbalizes Understanding      Falls Prevention: - Provides verbal and written material to individual with discussion of falls prevention and safety.   Cardiac Rehab from 02/22/2019 in [facility] Cardiac and Pulmonary Rehab  Date  02/22/19  Educator  [facility]  Instruction Review Code  1- Verbalizes Understanding      Diabetes: - Individual verbal and written instruction to review signs/symptoms of diabetes, desired ranges of glucose level fasting, after meals and with exercise. Acknowledge that pre and post exercise glucose checks will be done  for 3 sessions at entry of program.   Cardiac Rehab from 02/22/2019 in [facility] Cardiac and Pulmonary Rehab  Date  02/22/19  Educator  [facility]  Instruction Review Code  1- Verbalizes Understanding      Know Your Numbers and Risk Factors: -Group verbal and written instruction about important numbers in your health.  Discussion of what are risk factors and how they play a role in the disease  process.  Review of Cholesterol, Blood Pressure, Diabetes, and BMI and the role they play in your overall health.   Sleep Hygiene: -Provides group verbal and written instruction about how sleep can affect your health.  Define sleep hygiene, discuss sleep cycles and impact of sleep habits. Review good sleep hygiene tips.    Other: -Provides group and verbal instruction on various topics (see comments)   Knowledge Questionnaire Score: Knowledge Questionnaire Score - 02/22/19 1057      Knowledge Questionnaire Score   Pre Score  19/26   Education Focus: Angina, Depression, HF, Nutrition, Exercise      Core Components/Risk Factors/Patient Goals at Admission: Personal Goals and Risk Factors at Admission - 02/22/19 1057      Core Components/Risk Factors/Patient Goals on Admission    Weight Management  Yes;Obesity;Weight Loss    Intervention  Weight Management: Develop a combined nutrition and exercise program designed to reach desired caloric intake, while maintaining appropriate intake of nutrient and fiber, sodium and fats, and appropriate energy expenditure required for the weight goal.;Weight Management: Provide education and appropriate resources to help participant work on and attain dietary goals.;Weight Management/Obesity: Establish reasonable short term and long term weight goals.;Obesity: Provide education and appropriate resources to help participant work on and attain dietary goals.    Admit Weight  259 lb 9.6 oz (117.8 kg)    Goal Weight: Short Term  255 lb (115.7 kg)    Goal Weight: Long Term  249 lb (112.9 kg)    Expected Outcomes  Short Term: Continue to assess and modify interventions until short term weight is achieved;Long Term: Adherence to nutrition and physical activity/exercise program aimed toward attainment of established weight goal;Weight Loss: Understanding of general recommendations for a balanced deficit meal plan, which promotes 1-2 lb weight loss per week and  includes a negative energy balance of [phone] kcal/d;Understanding recommendations for meals to include 15-35% energy as protein, 25-35% energy from fat, 35-60% energy from carbohydrates, less than 244m of dietary cholesterol, 20-35 gm of total fiber daily;Understanding of distribution of calorie intake throughout the day with the consumption of 4-5 meals/snacks    Diabetes  Yes    Intervention  Provide education about signs/symptoms and action to take for hypo/hyperglycemia.;Provide education about proper nutrition, including hydration, and aerobic/resistive exercise prescription along with prescribed medications to achieve blood glucose in normal ranges: Fasting glucose 65-99 mg/dL    Expected Outcomes  Short Term: Participant verbalizes understanding of the signs/symptoms and immediate care of hyper/hypoglycemia, proper foot care and importance of medication, aerobic/resistive exercise and nutrition plan for blood glucose control.;Long Term: Attainment of HbA1C < 7%.    Hypertension  Yes    Intervention  Provide education on lifestyle modifcations including regular physical activity/exercise, weight management, moderate sodium restriction and increased consumption of fresh fruit, vegetables, and low fat dairy, alcohol moderation, and smoking cessation.;Monitor prescription use compliance.    Expected Outcomes  Short Term: Continued assessment and intervention until BP is < 140/911mHG in hypertensive participants. < 130/8032mG in hypertensive participants with diabetes, heart failure or  chronic kidney disease.;Long Term: Maintenance of blood pressure at goal levels.    Lipids  Yes    Intervention  Provide education and support for participant on nutrition & aerobic/resistive exercise along with prescribed medications to achieve LDL <78m, HDL >417m    Expected Outcomes  Short Term: Participant states understanding of desired cholesterol values and is compliant with medications prescribed. Participant  is following exercise prescription and nutrition guidelines.;Long Term: Cholesterol controlled with medications as prescribed, with individualized exercise RX and with personalized nutrition plan. Value goals: LDL < 7034mHDL > 40 mg.       Core Components/Risk Factors/Patient Goals Review:    Core Components/Risk Factors/Patient Goals at Discharge (Final Review):    ITP Comments: ITP Comments    Row Name 02/20/19 1452 02/22/19 1046 02/28/19 1642 03/14/19 0726 03/14/19 1407   ITP Comments  Completed virtual orientation visit.  [facility] EP/RD appt on 8/20.  Completed 6MWT, gym orientation, and RD evaluation. Initial ITP created and sent for review to Dr. MarEmily Filbertedical Director.  -  30 Day Review Completed today. Continue with ITP unless changed by Medical Director review.  New to program  Called pt to check up on her. She is feeling better since her stent.  She has her follow up on 9/16 to discuss returning to rehab.   RowHammondme 03/23/19 1003           ITP Comments  Called pt to let her know that her doctor cleared her to return to rehab.  However, her daughter is re-uping in the service and she will be watching her grandchild while her daughter settles into new routine.  Due to her schedule she will not be able to attend.  She will call us [place] things calm and she is ready to re-enroll and will start on visit 2.  She has a new referral from the most recent stent already. Discharge ITP sent.          Comments: Discharge ITP, will return later

## 2019-03-28 DIAGNOSIS — M898X9 Other specified disorders of bone, unspecified site: Secondary | ICD-10-CM | POA: Diagnosis not present

## 2019-03-28 DIAGNOSIS — B351 Tinea unguium: Secondary | ICD-10-CM | POA: Diagnosis not present

## 2019-03-28 DIAGNOSIS — E119 Type 2 diabetes mellitus without complications: Secondary | ICD-10-CM | POA: Diagnosis not present

## 2019-03-28 DIAGNOSIS — L851 Acquired keratosis [keratoderma] palmaris et plantaris: Secondary | ICD-10-CM | POA: Diagnosis not present

## 2019-03-28 DIAGNOSIS — L600 Ingrowing nail: Secondary | ICD-10-CM | POA: Diagnosis not present

## 2019-03-29 ENCOUNTER — Encounter: Payer: Self-pay | Admitting: Adult Health

## 2019-03-29 ENCOUNTER — Ambulatory Visit (INDEPENDENT_AMBULATORY_CARE_PROVIDER_SITE_OTHER): Payer: Medicare Other | Admitting: Adult Health

## 2019-03-29 ENCOUNTER — Other Ambulatory Visit: Payer: Self-pay

## 2019-03-29 VITALS — BP 110/76 | HR 75 | Resp 16 | Ht 67.0 in | Wt 261.0 lb

## 2019-03-29 DIAGNOSIS — Z955 Presence of coronary angioplasty implant and graft: Secondary | ICD-10-CM

## 2019-03-29 DIAGNOSIS — M545 Low back pain, unspecified: Secondary | ICD-10-CM

## 2019-03-29 DIAGNOSIS — I10 Essential (primary) hypertension: Secondary | ICD-10-CM | POA: Diagnosis not present

## 2019-03-29 MED ORDER — CYCLOBENZAPRINE HCL 10 MG PO TABS: 10.0000 mg | ORAL_TABLET | Freq: Two times a day (BID) | ORAL | 1 refills | Status: DC

## 2019-03-29 MED ORDER — AMLODIPINE BESYLATE 10 MG PO TABS: 10.0000 mg | ORAL_TABLET | Freq: Every day | ORAL | 1 refills | Status: DC

## 2019-03-29 MED ORDER — TRAMADOL HCL 50 MG PO TABS: 50.0000 mg | ORAL_TABLET | Freq: Three times a day (TID) | ORAL | 0 refills | Status: AC | PRN

## 2019-03-29 NOTE — Progress Notes (Signed)
[facility] [place], [place] [address]  Internal MEDICINE  Office Visit Note  Patient Name: Felicia Woods  [id number]  [id number]  Date of Service: 03/31/2019  Chief Complaint  Patient presents with  . Medical Management of Chronic Issues    follow up since stent placement, very fatigued     HPI Patient is here for follow-up after cardiac stent placement. First stent re-occluded and had to be corrected. Reports taking Plavix as prescribed.  Denies chest pain or shortness of breath. Overall she is doing much better since stent placement.  She is able to walk much longer distances, and denies any sob.      Current Medication: Outpatient Encounter Medications as of 03/29/2019  Medication Sig Note  . ACCU-CHEK SOFTCLIX LANCETS lancets Use as instructed once  DAILY DIAG -E11.9   . albuterol (PROVENTIL HFA;VENTOLIN HFA) 108 (90 Base) MCG/ACT inhaler Inhale 2 puffs into the lungs every 6 (six) hours as needed for wheezing or shortness of breath.   Marland Kitchen amLODipine (NORVASC) 10 MG tablet Take 1 tablet (10 mg total) by mouth daily.   Marland Kitchen aspirin EC 81 MG tablet Take 81 mg by mouth daily.   . bisoprolol-hydrochlorothiazide (ZIAC) 5-6.25 MG tablet Take 1 tablet by mouth daily.   . clopidogrel (PLAVIX) 75 MG tablet Take 1 tablet (75 mg total) by mouth daily.   . cyclobenzaprine (FLEXERIL) 10 MG tablet Take 1 tablet (10 mg total) by mouth 2 (two) times daily.   . Dulaglutide (TRULICITY) 1.5 JK/0.9FG SOPN Inject 1.5 mg into the skin once a week. (Patient taking differently: Inject 1.5 mg into the skin every Thursday. )   . furosemide (LASIX) 40 MG tablet Take 1 tablet (40 mg total) by mouth 2 (two) times daily.   Marland Kitchen gabapentin (NEURONTIN) 300 MG capsule Take 300 mg by mouth 3 (three) times daily.   Marland Kitchen glimepiride (AMARYL) 1 MG tablet Take 1 tablet (1 mg total) by mouth daily with breakfast.   . glucose blood (ACCU-CHEK AVIVA PLUS) test strip 1 each by Other route as needed for  other. Use as instructed once  DAILY DIAG -E11.9   . isosorbide mononitrate (IMDUR) 30 MG 24 hr tablet Take 30 mg by mouth daily.   . isosorbide mononitrate (IMDUR) 30 MG 24 hr tablet Take 1 tablet (30 mg total) by mouth daily.   Marland Kitchen losartan (COZAAR) 100 MG tablet Take one tab po qd (Patient taking differently: Take 100 mg by mouth daily. Take one tab po qd)   . montelukast (SINGULAIR) 10 MG tablet Take 1 tablet (10 mg total) by mouth daily. (Patient taking differently: Take 10 mg by mouth at bedtime. )   . nitroGLYCERIN (NITROSTAT) 0.4 MG SL tablet Place 0.4 mg under the tongue every 5 (five) minutes x 3 doses as needed for chest pain. *If no relief call MD or go to Emergency Room*   . oxybutynin (DITROPAN) 5 MG tablet Take 1 tablet (5 mg total) by mouth daily after supper.   . sertraline (ZOLOFT) 100 MG tablet Take 1 tablet (100 mg total) by mouth daily. (Patient taking differently: Take 100 mg by mouth at bedtime. ) 02/20/2019: Not currently taking  . simvastatin (ZOCOR) 40 MG tablet Take 40 mg by mouth daily.   Marland Kitchen zolpidem (AMBIEN) 10 MG tablet Take 1 tablet (10 mg total) by mouth at bedtime.   . [DISCONTINUED] amLODipine (NORVASC) 10 MG tablet Take 1 tablet (10 mg total) by mouth daily.   . [  DISCONTINUED] cyclobenzaprine (FLEXERIL) 10 MG tablet Take 1 tablet (10 mg total) by mouth 3 (three) times daily as needed for muscle spasms. (Patient taking differently: Take 10 mg by mouth 2 (two) times daily. )   . traMADol (ULTRAM) 50 MG tablet Take 1 tablet (50 mg total) by mouth every 8 (eight) hours as needed for up to 5 days.   . [DISCONTINUED] atorvastatin (LIPITOR) 20 MG tablet Take 1 tablet (20 mg total) by mouth daily. (Patient not taking: Reported on 03/05/2019)   . [DISCONTINUED] estradiol (ESTRACE) 0.5 MG tablet Take 1 tablet (0.5 mg total) by mouth daily. (Patient not taking: Reported on 03/05/2019)    No facility-administered encounter medications on file as of 03/29/2019.     Surgical  History: Past Surgical History:  Procedure Laterality Date  . ABDOMINAL HYSTERECTOMY    . CARDIAC CATHETERIZATION    . CARDIAC CATHETERIZATION N/A 10/18/2015   Procedure: Left Heart Cath and Cors/Grafts Angiography;  Surgeon: Lorretta Harp, MD;  Location: [place] CV LAB;  Service: Cardiovascular;  Laterality: N/A;  . CARDIAC CATHETERIZATION N/A 10/18/2015   Procedure: Coronary Stent Intervention;  Surgeon: Lorretta Harp, MD;  Location: [place] CV LAB;  Service: Cardiovascular;  Laterality: N/A;  . CARDIAC SURGERY    . CHOLECYSTECTOMY    . CORONARY ANGIOPLASTY    . CORONARY ARTERY BYPASS GRAFT     4 vessels - 2010  . CORONARY STENT INTERVENTION N/A 02/16/2017   Procedure: CORONARY STENT INTERVENTION;  Surgeon: Yolonda Kida, MD;  Location: [place] CV LAB;  Service: Cardiovascular;  Laterality: N/A;  . CORONARY STENT INTERVENTION N/A 02/13/2019   Procedure: CORONARY STENT INTERVENTION;  Surgeon: Nelva Bush, MD;  Location: [place] CV LAB;  Service: Cardiovascular;  Laterality: N/A;  SVG to RCA  . CORONARY STENT INTERVENTION Left 03/08/2019   Procedure: CORONARY STENT INTERVENTION;  Surgeon: Yolonda Kida, MD;  Location: [place] CV LAB;  Service: Cardiovascular;  Laterality: Left;  . LEFT HEART CATH AND CORONARY ANGIOGRAPHY Left 02/16/2017   Procedure: LEFT HEART CATH AND CORONARY ANGIOGRAPHY;  Surgeon: Corey Skains, MD;  Location: [place] CV LAB;  Service: Cardiovascular;  Laterality: Left;  . LEFT HEART CATH AND CORS/GRAFTS ANGIOGRAPHY Left 11/23/2017   Procedure: LEFT HEART CATH AND CORS/GRAFTS ANGIOGRAPHY;  Surgeon: Corey Skains, MD;  Location: [place] CV LAB;  Service: Cardiovascular;  Laterality: Left;  . LEFT HEART CATH AND CORS/GRAFTS ANGIOGRAPHY N/A 02/13/2019   Procedure: LEFT HEART CATH AND CORS/GRAFTS ANGIOGRAPHY;  Surgeon: Corey Skains, MD;  Location: [place] CV LAB;  Service: Cardiovascular;  Laterality: N/A;     Medical History: Past Medical History:  Diagnosis Date  . Asthma   . Coronary artery disease   . Diabetes mellitus without complication ([place])   . Hyperlipidemia   . Hypertension   . MI (myocardial infarction) ([place])   . Migraine headache with aura   . 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    Family History: Family History  Problem Relation Age of Onset  . Diabetes Mother   . Diabetes Father   . Cancer Father   . Diabetes Brother     Social History   Socioeconomic History  . Marital status: Married    Spouse name: Jeneen Rinks  . Number of children: Not on file  . Years of education: Not on file  . Highest education level: Not on file  Occupational History  . Not on file  Social Needs  .  Financial resource strain: Not on file  . Food insecurity    Worry: Not on file    Inability: Not on file  . Transportation needs    Medical: Not on file    Non-medical: Not on file  Tobacco Use  . Smoking status: Never Smoker  . Smokeless tobacco: Never Used  Substance and Sexual Activity  . Alcohol use: No    Alcohol/week: 0.0 standard drinks  . Drug use: No  . Sexual activity: Yes  Lifestyle  . Physical activity    Days per week: Not on file    Minutes per session: Not on file  . Stress: Not on file  Relationships  . Social Herbalist on phone: Not on file    Gets together: Not on file    Attends religious service: Not on file    Active member of club or organization: Not on file    Attends meetings of clubs or organizations: Not on file    Relationship status: Not on file  . Intimate partner violence    Fear of current or ex partner: Not on file    Emotionally abused: Not on file    Physically abused: Not on file    Forced sexual activity: Not on file  Other Topics Concern  . Not on file  Social History Narrative  . Not on file      Review of Systems  Constitutional: Negative for chills, fatigue and unexpected weight change.  HENT: Negative for  congestion, rhinorrhea, sneezing and sore throat.   Eyes: Negative for photophobia, pain and redness.  Respiratory: Negative for cough, chest tightness and shortness of breath.   Cardiovascular: Negative for chest pain and palpitations.  Gastrointestinal: Negative for abdominal pain, constipation, diarrhea, nausea and vomiting.  Endocrine: Negative.   Genitourinary: Negative for dysuria and frequency.  Musculoskeletal: Negative for arthralgias, back pain, joint swelling and neck pain.  Skin: Negative for rash.  Allergic/Immunologic: Negative.   Neurological: Negative for tremors and numbness.  Hematological: Negative for adenopathy. Does not bruise/bleed easily.  Psychiatric/Behavioral: Negative for behavioral problems and sleep disturbance. The patient is not nervous/anxious.     Vital Signs: BP 110/76   Pulse 75   Resp 16   Ht _0  (1.702 m)   Wt 261 lb (118.4 kg)   SpO2 96%   BMI 40.88 kg/m    Physical Exam Vitals signs and nursing note reviewed.  Constitutional:      General: She is not in acute distress.    Appearance: She is well-developed. She is not diaphoretic.  HENT:     Head: Normocephalic and atraumatic.     Mouth/Throat:     Pharynx: No oropharyngeal exudate.  Eyes:     Pupils: Pupils are equal, round, and reactive to light.  Neck:     Musculoskeletal: Normal range of motion and neck supple.     Thyroid: No thyromegaly.     Vascular: No JVD.     Trachea: No tracheal deviation.  Cardiovascular:     Rate and Rhythm: Normal rate and regular rhythm.     Heart sounds: Normal heart sounds. No murmur. No friction rub. No gallop.   Pulmonary:     Effort: Pulmonary effort is normal. No respiratory distress.     Breath sounds: Normal breath sounds. No wheezing or rales.  Chest:     Chest wall: No tenderness.  Abdominal:     Palpations: Abdomen is soft.  Tenderness: There is no abdominal tenderness. There is no guarding.  Musculoskeletal: Normal range of  motion.  Lymphadenopathy:     Cervical: No cervical adenopathy.  Skin:    General: Skin is warm and dry.  Neurological:     Mental Status: She is alert and oriented to person, place, and time.     Cranial Nerves: No cranial nerve deficit.  Psychiatric:        Behavior: Behavior normal.        Thought Content: Thought content normal.        Judgment: Judgment normal.    Assessment/Plan: 1. S/P drug eluting coronary stent placement Blood pressure remains stable at this time, patient reports no chest pain or shortness of breath. Continue to monitor and follow-up with cardiologist.  2. Essential hypertension Stable at this time, continue current therapy and continue to monitor. - amLODipine (NORVASC) 10 MG tablet; Take 1 tablet (10 mg total) by mouth daily.  Dispense: 90 tablet; Refill: 1  3. Acute bilateral low back pain without sciatica MRI postponed due to cardiac stent placement, rescheduled. Continues to have pain, controlled with current therapy. Continue to monitor. - traMADol (ULTRAM) 50 MG tablet; Take 1 tablet (50 mg total) by mouth every 8 (eight) hours as needed for up to 5 days.  Dispense: 30 tablet; Refill: 0 - cyclobenzaprine (FLEXERIL) 10 MG tablet; Take 1 tablet (10 mg total) by mouth 2 (two) times daily.  Dispense: 60 tablet; Refill: 1  General Counseling: Lumina verbalizes understanding of the findings of todays visit and agrees with plan of treatment. I have discussed any further diagnostic evaluation that may be needed or ordered today. We also reviewed her medications today. she has been encouraged to call the office with any questions or concerns that should arise related to todays visit.    No orders of the defined types were placed in this encounter.   Meds ordered this encounter  Medications  . traMADol (ULTRAM) 50 MG tablet    Sig: Take 1 tablet (50 mg total) by mouth every 8 (eight) hours as needed for up to 5 days.    Dispense:  30 tablet    Refill:  0   . cyclobenzaprine (FLEXERIL) 10 MG tablet    Sig: Take 1 tablet (10 mg total) by mouth 2 (two) times daily.    Dispense:  60 tablet    Refill:  1  . amLODipine (NORVASC) 10 MG tablet    Sig: Take 1 tablet (10 mg total) by mouth daily.    Dispense:  90 tablet    Refill:  1    Time spent:15Minutes   This patient was seen by Orson Gear AGNP-C in Collaboration with Dr Lavera Guise as a part of collaborative care agreement     Kendell Bane AGNP-C Internal medicine

## 2019-04-03 ENCOUNTER — Telehealth: Payer: Self-pay

## 2019-04-03 ENCOUNTER — Other Ambulatory Visit: Payer: Self-pay | Admitting: Adult Health

## 2019-04-03 MED ORDER — ONDANSETRON 4 MG PO TBDP: 4.0000 mg | ORAL_TABLET | Freq: Two times a day (BID) | ORAL | 0 refills | Status: DC

## 2019-04-03 NOTE — Progress Notes (Signed)
Sent Zofran to pharmacy, pt will follow up in no improvement in nausea.

## 2019-04-03 NOTE — Telephone Encounter (Signed)
Pt called stating she has been feeling nauseated and unable to keep food down and has been going on since she was last seen on 04/01/19 but forgot to mention it to [place] would like to get something to help with her nausea.   Spoke with Q[place]a Skye and per Q[place]a Skye I asked pt if she feels any sob, chest pain/pressure, coughing, or dizziness. Pt stated she did not feel those symptoms.  Notified adam and adam advised me to let the pt know that he will send in medication and if pt does not feel better, she should give [place] a call back. I advised the pt.

## 2019-04-07 DIAGNOSIS — Z23 Encounter for immunization: Secondary | ICD-10-CM | POA: Diagnosis not present

## 2019-04-19 ENCOUNTER — Other Ambulatory Visit: Payer: Self-pay | Admitting: Internal Medicine

## 2019-04-19 DIAGNOSIS — Z1231 Encounter for screening mammogram for malignant neoplasm of breast: Secondary | ICD-10-CM

## 2019-04-23 ENCOUNTER — Inpatient Hospital Stay
Admission: RE | Admit: 2019-04-23 | Discharge: 2019-04-23 | Disposition: A | Discharge status: Home / Self Care | Payer: Self-pay | Source: Ambulatory Visit | Attending: Internal Medicine | Admitting: Internal Medicine

## 2019-04-23 ENCOUNTER — Other Ambulatory Visit: Payer: Self-pay | Admitting: Internal Medicine

## 2019-04-23 DIAGNOSIS — Z1231 Encounter for screening mammogram for malignant neoplasm of breast: Secondary | ICD-10-CM

## 2019-05-07 ENCOUNTER — Ambulatory Visit: Admission: RE | Admit: 2019-05-07 | Payer: Medicare Other | Source: Ambulatory Visit

## 2019-05-30 ENCOUNTER — Encounter: Payer: Self-pay | Admitting: Emergency Medicine

## 2019-05-30 ENCOUNTER — Emergency Department: Payer: Medicare Other

## 2019-05-30 ENCOUNTER — Other Ambulatory Visit: Payer: Self-pay

## 2019-05-30 ENCOUNTER — Observation Stay
Admission: EM | Admit: 2019-05-30 | Discharge: 2019-05-31 | Disposition: A | Discharge status: Home / Self Care | Payer: Medicare Other | Attending: Internal Medicine | Admitting: Internal Medicine

## 2019-05-30 DIAGNOSIS — I4510 Unspecified right bundle-branch block: Secondary | ICD-10-CM | POA: Diagnosis not present

## 2019-05-30 DIAGNOSIS — Z79899 Other long term (current) drug therapy: Secondary | ICD-10-CM | POA: Insufficient documentation

## 2019-05-30 DIAGNOSIS — I2510 Atherosclerotic heart disease of native coronary artery without angina pectoris: Secondary | ICD-10-CM | POA: Diagnosis present

## 2019-05-30 DIAGNOSIS — E782 Mixed hyperlipidemia: Secondary | ICD-10-CM | POA: Diagnosis not present

## 2019-05-30 DIAGNOSIS — Z7902 Long term (current) use of antithrombotics/antiplatelets: Secondary | ICD-10-CM | POA: Insufficient documentation

## 2019-05-30 DIAGNOSIS — I252 Old myocardial infarction: Secondary | ICD-10-CM | POA: Diagnosis not present

## 2019-05-30 DIAGNOSIS — Z7982 Long term (current) use of aspirin: Secondary | ICD-10-CM | POA: Diagnosis not present

## 2019-05-30 DIAGNOSIS — R0789 Other chest pain: Secondary | ICD-10-CM | POA: Diagnosis not present

## 2019-05-30 DIAGNOSIS — Z880 Allergy status to penicillin: Secondary | ICD-10-CM | POA: Insufficient documentation

## 2019-05-30 DIAGNOSIS — E785 Hyperlipidemia, unspecified: Secondary | ICD-10-CM | POA: Insufficient documentation

## 2019-05-30 DIAGNOSIS — J45909 Unspecified asthma, uncomplicated: Secondary | ICD-10-CM | POA: Diagnosis present

## 2019-05-30 DIAGNOSIS — Z6837 Body mass index (BMI) 37.0-37.9, adult: Secondary | ICD-10-CM | POA: Diagnosis not present

## 2019-05-30 DIAGNOSIS — R079 Chest pain, unspecified: Secondary | ICD-10-CM | POA: Diagnosis present

## 2019-05-30 DIAGNOSIS — E119 Type 2 diabetes mellitus without complications: Secondary | ICD-10-CM | POA: Diagnosis present

## 2019-05-30 DIAGNOSIS — Z7984 Long term (current) use of oral hypoglycemic drugs: Secondary | ICD-10-CM | POA: Insufficient documentation

## 2019-05-30 DIAGNOSIS — Z955 Presence of coronary angioplasty implant and graft: Secondary | ICD-10-CM | POA: Insufficient documentation

## 2019-05-30 DIAGNOSIS — I10 Essential (primary) hypertension: Secondary | ICD-10-CM | POA: Diagnosis not present

## 2019-05-30 DIAGNOSIS — R0602 Shortness of breath: Secondary | ICD-10-CM | POA: Diagnosis not present

## 2019-05-30 DIAGNOSIS — Z951 Presence of aortocoronary bypass graft: Secondary | ICD-10-CM | POA: Diagnosis not present

## 2019-05-30 DIAGNOSIS — E1159 Type 2 diabetes mellitus with other circulatory complications: Secondary | ICD-10-CM | POA: Diagnosis not present

## 2019-05-30 DIAGNOSIS — J4520 Mild intermittent asthma, uncomplicated: Secondary | ICD-10-CM | POA: Diagnosis not present

## 2019-05-30 DIAGNOSIS — Z20828 Contact with and (suspected) exposure to other viral communicable diseases: Secondary | ICD-10-CM | POA: Diagnosis not present

## 2019-05-30 DIAGNOSIS — I25810 Atherosclerosis of coronary artery bypass graft(s) without angina pectoris: Secondary | ICD-10-CM | POA: Diagnosis present

## 2019-05-30 LAB — BASIC METABOLIC PANEL
Anion gap: 10 (ref 5–15)
BUN: 13 mg/dL (ref 8–23)
CO2: 26 mmol/L (ref 22–32)
Calcium: 9.1 mg/dL (ref 8.9–10.3)
Chloride: 104 mmol/L (ref 98–111)
Creatinine, Ser: 0.77 mg/dL (ref 0.44–1.00)
GFR calc Af Amer: >60 mL/min (ref >60)
GFR calc non Af Amer: >60 mL/min (ref >60)
Glucose, Bld: 175 mg/dL — ABNORMAL HIGH (ref 70–99)
Potassium: 3.2 mmol/L — ABNORMAL LOW (ref 3.5–5.1)
Sodium: 140 mmol/L (ref 135–145)

## 2019-05-30 LAB — CBC WITH DIFFERENTIAL/PLATELET
Abs Immature Granulocytes: 0.02 10*3/uL (ref 0.00–0.07)
Basophils Absolute: 0 10*3/uL (ref 0.0–0.1)
Basophils Relative: 0 %
Eosinophils Absolute: 0.2 10*3/uL (ref 0.0–0.5)
Eosinophils Relative: 2 %
HCT: 39 % (ref 36.0–46.0)
Hemoglobin: 12.6 g/dL (ref 12.0–15.0)
Immature Granulocytes: 0 %
Lymphocytes Relative: 32 %
Lymphs Abs: 3.1 10*3/uL (ref 0.7–4.0)
MCH: 25.5 pg — ABNORMAL LOW (ref 26.0–34.0)
MCHC: 32.3 g/dL (ref 30.0–36.0)
MCV: 78.8 fL — ABNORMAL LOW (ref 80.0–100.0)
Monocytes Absolute: 0.6 10*3/uL (ref 0.1–1.0)
Monocytes Relative: 6 %
Neutro Abs: 5.7 10*3/uL (ref 1.7–7.7)
Neutrophils Relative %: 60 %
Platelets: 238 10*3/uL (ref 150–400)
RBC: 4.95 MIL/uL (ref 3.87–5.11)
RDW: 16.4 % — ABNORMAL HIGH (ref 11.5–15.5)
WBC: 9.5 10*3/uL (ref 4.0–10.5)
nRBC: 0 % (ref 0.0–0.2)

## 2019-05-30 LAB — TROPONIN I (HIGH SENSITIVITY)
Troponin I (High Sensitivity): 5 ng/L (ref <18)
Troponin I (High Sensitivity): 6 ng/L (ref <18)

## 2019-05-30 LAB — FIBRIN DERIVATIVES D-DIMER (ARMC ONLY): Fibrin derivatives D-dimer (ARMC): 635.04 ng/mL (FEU) — ABNORMAL HIGH (ref 0.00–499.00)

## 2019-05-30 LAB — MAGNESIUM: Magnesium: 1.9 mg/dL (ref 1.7–2.4)

## 2019-05-30 MED ORDER — ZOLPIDEM TARTRATE 5 MG PO TABS
5.0000 mg | ORAL_TABLET | Freq: Every day | ORAL | Status: DC
  Administered 2019-05-31: 5 mg via ORAL
  Filled 2019-05-30: qty 1

## 2019-05-30 MED ORDER — ASPIRIN EC 81 MG PO TBEC
81.0000 mg | DELAYED_RELEASE_TABLET | Freq: Every day | ORAL | Status: DC
  Administered 2019-05-31: 81 mg via ORAL
  Filled 2019-05-30: qty 1

## 2019-05-30 MED ORDER — SODIUM CHLORIDE 0.9% FLUSH
3.0000 mL | Freq: Two times a day (BID) | INTRAVENOUS | Status: DC
  Administered 2019-05-31: 3 mL via INTRAVENOUS

## 2019-05-30 MED ORDER — SERTRALINE HCL 50 MG PO TABS
100.0000 mg | ORAL_TABLET | Freq: Every day | ORAL | Status: DC
  Administered 2019-05-31: 100 mg via ORAL
  Filled 2019-05-30: qty 2

## 2019-05-30 MED ORDER — HYDROCODONE-ACETAMINOPHEN 5-325 MG PO TABS
1.0000 | ORAL_TABLET | ORAL | Status: DC | PRN
  Administered 2019-05-31 (×2): 2 via ORAL
  Filled 2019-05-30 (×2): qty 2

## 2019-05-30 MED ORDER — FUROSEMIDE 40 MG PO TABS
40.0000 mg | ORAL_TABLET | Freq: Two times a day (BID) | ORAL | Status: DC
  Administered 2019-05-31: 40 mg via ORAL
  Filled 2019-05-30: qty 1

## 2019-05-30 MED ORDER — ATORVASTATIN CALCIUM 20 MG PO TABS: 20.0000 mg | ORAL_TABLET | Freq: Every day | ORAL | Status: DC

## 2019-05-30 MED ORDER — ACETAMINOPHEN 325 MG PO TABS: 650.0000 mg | ORAL_TABLET | Freq: Four times a day (QID) | ORAL | Status: DC | PRN

## 2019-05-30 MED ORDER — LOSARTAN POTASSIUM 50 MG PO TABS
100.0000 mg | ORAL_TABLET | Freq: Every day | ORAL | Status: DC
  Administered 2019-05-31: 100 mg via ORAL
  Filled 2019-05-30: qty 2

## 2019-05-30 MED ORDER — CLOPIDOGREL BISULFATE 75 MG PO TABS
75.0000 mg | ORAL_TABLET | Freq: Every day | ORAL | Status: DC
  Administered 2019-05-31: 75 mg via ORAL
  Filled 2019-05-30: qty 1

## 2019-05-30 MED ORDER — ACETAMINOPHEN 650 MG RE SUPP: 650.0000 mg | Freq: Four times a day (QID) | RECTAL | Status: DC | PRN

## 2019-05-30 MED ORDER — SODIUM CHLORIDE 0.9% FLUSH: 3.0000 mL | INTRAVENOUS | Status: DC | PRN

## 2019-05-30 MED ORDER — POTASSIUM CHLORIDE CRYS ER 20 MEQ PO TBCR
40.0000 meq | EXTENDED_RELEASE_TABLET | Freq: Once | ORAL | Status: AC
  Administered 2019-05-31: 40 meq via ORAL
  Filled 2019-05-30: qty 2

## 2019-05-30 MED ORDER — ISOSORBIDE MONONITRATE ER 30 MG PO TB24
30.0000 mg | ORAL_TABLET | Freq: Every day | ORAL | Status: DC
  Administered 2019-05-31: 30 mg via ORAL
  Filled 2019-05-30: qty 1

## 2019-05-30 MED ORDER — AMLODIPINE BESYLATE 10 MG PO TABS
10.0000 mg | ORAL_TABLET | Freq: Every day | ORAL | Status: DC
  Administered 2019-05-31: 10 mg via ORAL
  Filled 2019-05-30: qty 1

## 2019-05-30 MED ORDER — MONTELUKAST SODIUM 10 MG PO TABS
10.0000 mg | ORAL_TABLET | Freq: Every day | ORAL | Status: DC
  Administered 2019-05-31: 10 mg via ORAL
  Filled 2019-05-30: qty 1

## 2019-05-30 MED ORDER — ONDANSETRON HCL 4 MG PO TABS: 4.0000 mg | ORAL_TABLET | Freq: Four times a day (QID) | ORAL | Status: DC | PRN

## 2019-05-30 MED ORDER — MORPHINE SULFATE (PF) 2 MG/ML IV SOLN: 2.0000 mg | INTRAVENOUS | Status: DC | PRN

## 2019-05-30 MED ORDER — ONDANSETRON HCL 4 MG/2ML IJ SOLN: 4.0000 mg | Freq: Four times a day (QID) | INTRAMUSCULAR | Status: DC | PRN

## 2019-05-30 MED ORDER — ENOXAPARIN SODIUM 40 MG/0.4ML ~~LOC~~ SOLN
40.0000 mg | Freq: Two times a day (BID) | SUBCUTANEOUS | Status: DC
  Administered 2019-05-31: 40 mg via SUBCUTANEOUS
  Filled 2019-05-30: qty 0.4

## 2019-05-30 MED ORDER — SIMVASTATIN 20 MG PO TABS: 40.0000 mg | ORAL_TABLET | Freq: Every day | ORAL | Status: DC

## 2019-05-30 MED ORDER — SODIUM CHLORIDE 0.9 % IV SOLN: 250.0000 mL | INTRAVENOUS | Status: DC | PRN

## 2019-05-30 MED ORDER — OXYBUTYNIN CHLORIDE 5 MG PO TABS
5.0000 mg | ORAL_TABLET | Freq: Every day | ORAL | Status: DC
  Filled 2019-05-30: qty 1

## 2019-05-30 MED ORDER — NITROGLYCERIN 2 % TD OINT
1.0000 [in_us] | TOPICAL_OINTMENT | Freq: Once | TRANSDERMAL | Status: AC
  Administered 2019-05-30: 1 [in_us] via TOPICAL
  Filled 2019-05-30: qty 1

## 2019-05-30 NOTE — ED Triage Notes (Signed)
Pt arrived via EMS from home with c/o sudden onset of chest pain since this morning. Pt states her pain never improved so she called EMS this evening. Hx of multiple MI and cardiac stent placement. +SOB. Hypertensive upon EMS arrival which improved after nitro spray. Nitro received at 1800 and again at 42. Pt reports no improvement with chest pain after nitro. 4 baby aspirin received prior to arrival.

## 2019-05-30 NOTE — Progress Notes (Signed)
PHARMACIST - PHYSICIAN COMMUNICATION  CONCERNING:  Enoxaparin (Lovenox) for DVT Prophylaxis    RECOMMENDATION: Patient was prescribed enoxaprin 40mg  q24 hours for VTE prophylaxis.   Filed Weights   05/30/19 1959 05/30/19 2338  Weight: 240 lb (108.9 kg) 259 lb 4.8 oz (117.6 kg)    Body mass index is 40.61 kg/m.  Estimated Creatinine Clearance: 96.7 mL/min (by C-G formula based on SCr of 0.77 mg/dL).  Based on [place] patient is candidate for enoxaparin 40mg  every 12 hour dosing due to BMI being >40.  DESCRIPTION: Pharmacy has adjusted enoxaparin dose per [facility] policy.  Patient is now receiving enoxaparin 40mg  every 12 hours.   Ena Dawley, PharmD Clinical Pharmacist  05/30/2019 11:45 PM

## 2019-05-30 NOTE — H&P (Signed)
Felicia Woods DDU:[id number] DOB: Aug 08, 1955 DOA: 05/30/2019     PCP: Lavera Guise, MD   Outpatient Specialists:   CARDS:  Dr. Nehemiah Massed    Patient arrived to ER on 05/30/19 at [facility]  Patient coming from: home Lives  With family    Chief Complaint:   Chief Complaint  Patient presents with  . Chest Pain    HPI: Felicia Woods is a 63 y.o. female with medical history significant of CAD, HTN, DM2 HLD, asthma    Presented with  Chest pain started today Started today, she woke up this AM felt well but at 11 AM she had sharp pain in her chest But by 3Pm it was hurting worse she took a nap woke up at 6 pm took a nitro it did not help so she called 911. She had similar pain before and had to have stent placed  She states pain is worse with taking a breath  Infectious risk factors:  Reports  chest pain,     In  ER RAPID COVID TEST  in house testing  Pending  Lab Results  Component Value Date   [place] 03/05/2019   [place] NEGATIVE 02/13/2019   [place] NEGATIVE 02/09/2019     Regarding pertinent Chronic problems:     Hyperlipidemia -  on statins Zocor   HTN on norvasc, bisoprolol/HCTZ, imdur, cozaar   CHF ? no echo in the system  On lasix   CAD  - On Aspirin, statin, betablocker, Plavix                 -  followed by cardiology                - last cardiac cath September 2020 Yolonda Kida, MD Nelva Bush, MD  Mid LAD to [facility] LAD lesion is 90% stenosed.  Ramus-1 lesion is 90% stenosed.  Ramus-2 lesion is 100% stenosed.  Ost Cx to Prox Cx lesion is 90% stenosed.  Prox RCA lesion is 95% stenosed.  Dist RCA lesion is 100% stenosed.  Previously placed Origin drug eluting stent is widely patent.  Prox Graft to Mid Graft lesion is 10% stenosed.  Previously placed Mid Graft drug eluting stent is widely patent.  Previously placed Dist Graft drug eluting stent is widely patent.  Origin to Prox Graft lesion is 10% stenosed.   Mid Graft lesion is 65% stenosed.  SVG.  Prox Graft lesion is 95% stenosed.  Origin to Prox Graft lesion is 100% stenosed.  Origin to Prox Graft lesion is 70% stenosed.  A drug-eluting stent was successfully placed using a STENT SYNERGY DES 3X20.  Post intervention, there is a 0% residual stenosis.                Status post coronary artery bypass thousand 10 Story of STEMI in 2017  DM 2 -  Lab Results  Component Value Date   HGBA1C 6.6 (A) 01/18/2019    PO meds only,       obesity-   BMI Readings from Last 1 Encounters:  05/30/19 37.59 kg/m       Asthma -well   controlled on home inhalers                                                  While in ER:  Troponin wnl  The following Work up has been ordered so far:  Orders Placed This Encounter  Procedures  . SARS CORONAVIRUS 2 (TAT 6-24 HRS) Nasopharyngeal Nasopharyngeal Swab  . DG Chest Portable 1 View  . Basic metabolic panel  . CBC with Differential  . Consult to hospitalist  ALL PATIENTS BEING ADMITTED/HAVING PROCEDURES NEED COVID-19 SCREENING  . ED EKG  . EKG 12-Lead     Following Medications were ordered in ER: Medications  nitroGLYCERIN (NITROGLYN) 2 % ointment 1 inch (1 inch Topical Given 05/30/19 2144)        Consult Orders  (From admission, onward)         Start     Ordered   05/30/19 2057  Consult to hospitalist  ALL PATIENTS BEING ADMITTED/HAVING PROCEDURES NEED COVID-19 SCREENING  Once    Comments: ALL PATIENTS BEING ADMITTED/HAVING PROCEDURES NEED COVID-19 SCREENING  Provider:  (Not yet assigned)  Question Answer Comment  Place call to: [phone]   Reason for Consult Admit      05/30/19 2057          Significant initial  Findings: Abnormal Labs Reviewed  BASIC METABOLIC PANEL - Abnormal; Notable for the following components:      Result Value   Potassium 3.2 (*)    Glucose, Bld 175 (*)    All other components within normal limits  CBC WITH DIFFERENTIAL/PLATELET - Abnormal;  Notable for the following components:   MCV 78.8 (*)    MCH 25.5 (*)    RDW 16.4 (*)    All other components within normal limits     Otherwise labs showing:    Recent Labs  Lab 05/30/19 2006  NA 140  K 3.2*  CO2 26  GLUCOSE 175*  BUN 13  CREATININE 0.77  CALCIUM 9.1    Cr    Stable,  Lab Results  Component Value Date   CREATININE 0.77 05/30/2019   CREATININE 0.84 03/01/2019   CREATININE 0.60 02/14/2019    No results for input(s): AST, ALT, ALKPHOS, BILITOT, PROT, ALBUMIN in the last 168 hours. Lab Results  Component Value Date   CALCIUM 9.1 05/30/2019      WBC      Component Value Date/Time   WBC 9.5 05/30/2019 2006   ANC    Component Value Date/Time   NEUTROABS 5.7 05/30/2019 2006   ALC No components found for: LYMPHAB    Plt: Lab Results  Component Value Date   PLT 238 05/30/2019     Lactic Acid, Venous No results found for: LATICACIDVEN      COVID-19 Labs  No results for input(s): DDIMER, FERRITIN, LDH, CRP in the last 72 hours.  Lab Results  Component Value Date   SARSCOV2NAA NEGATIVE 03/05/2019   SARSCOV2NAA NEGATIVE 02/13/2019   [place] NEGATIVE 02/09/2019     HG/HCT stable,       Component Value Date/Time   HGB 12.6 05/30/2019 2006   HCT 39.0 05/30/2019 2006    Troponin 9 5 -6- 6     ECG: Ordered Personally reviewed by me showing: HR : 74  Rhythm:  RBBB,   , no evidence of ischemic changes QTC 515    DM  labs:  HbA1C: Recent Labs    09/08/18 0906 01/18/19 0930  HGBA1C 6.4* 6.6*         UA not ordered      Ordered   CXR - NON acute possible atelectasis    ED Triage Vitals  Enc Vitals Group  BP 05/30/19 1957 (!) 166/90     Pulse Rate 05/30/19 1957 72     Resp 05/30/19 1957 (!) 81     Temp 05/30/19 1957 98.6 F (37 C)     Temp Source 05/30/19 1957 Oral     SpO2 05/30/19 1957 99 %     Weight 05/30/19 1959 240 lb (108.9 kg)     Height 05/30/19 1959 _0  (1.702 m)     Head Circumference --       Peak Flow --      Pain Score 05/30/19 1958 7     Pain Loc --      Pain Edu? --      Excl. in [place]? --   TMAX(24)@       Latest  Blood pressure (!) 153/94, pulse 65, temperature 98.6 F (37 C), temperature source Oral, resp. rate 16, height _1  (1.702 m), weight 108.9 kg, SpO2 100 %.     Hospitalist was called for admission for chest pain    Review of Systems:    Pertinent positives include: chest pain   Constitutional:  No weight loss, night sweats, Fevers, chills, fatigue, weight loss  HEENT:  No headaches, Difficulty swallowing,Tooth/dental problems,Sore throat,  No sneezing, itching, ear ache, nasal congestion, post nasal drip,  Cardio-vascular:  No chest pain, Orthopnea, PND, anasarca, dizziness, palpitations.no Bilateral lower extremity swelling  GI:  No heartburn, indigestion, abdominal pain, nausea, vomiting, diarrhea, change in bowel habits, loss of appetite, melena, blood in stool, hematemesis Resp:  no shortness of breath at rest. No dyspnea on exertion, No excess mucus, no productive cough, No non-productive cough, No coughing up of blood.No change in color of mucus.No wheezing. Skin:  no rash or lesions. No jaundice GU:  no dysuria, change in color of urine, no urgency or frequency. No straining to urinate.  No flank pain.  Musculoskeletal:  No joint pain or no joint swelling. No decreased range of motion. No back pain.  Psych:  No change in mood or affect. No depression or anxiety. No memory loss.  Neuro: no localizing neurological complaints, no tingling, no weakness, no double vision, no gait abnormality, no slurred speech, no confusion  All systems reviewed and apart from [place] all are negative  Past Medical History:   Past Medical History:  Diagnosis Date  . Asthma   . Coronary artery disease   . Diabetes mellitus without complication ([place])   . Hyperlipidemia   . Hypertension   . MI (myocardial infarction) ([place])   . Migraine headache with  aura   . 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      Past Surgical History:  Procedure Laterality Date  . ABDOMINAL HYSTERECTOMY    . CARDIAC CATHETERIZATION    . CARDIAC CATHETERIZATION N/A 10/18/2015   Procedure: Left Heart Cath and Cors/Grafts Angiography;  Surgeon: Lorretta Harp, MD;  Location: [facility];  Service: Cardiovascular;  Laterality: N/A;  . CARDIAC CATHETERIZATION N/A 10/18/2015   Procedure: Coronary Stent Intervention;  Surgeon: Lorretta Harp, MD;  Location: [facility];  Service: Cardiovascular;  Laterality: N/A;  . CARDIAC SURGERY    . CHOLECYSTECTOMY    . CORONARY ANGIOPLASTY    . CORONARY ARTERY BYPASS GRAFT     4 vessels - 2010  . CORONARY STENT INTERVENTION N/A 02/16/2017   Procedure: CORONARY STENT INTERVENTION;  Surgeon: Yolonda Kida, MD;  Location: [facility];  Service: Cardiovascular;  Laterality: N/A;  .  CORONARY STENT INTERVENTION N/A 02/13/2019   Procedure: CORONARY STENT INTERVENTION;  Surgeon: Nelva Bush, MD;  Location: [facility];  Service: Cardiovascular;  Laterality: N/A;  SVG to RCA  . CORONARY STENT INTERVENTION Left 03/08/2019   Procedure: CORONARY STENT INTERVENTION;  Surgeon: Yolonda Kida, MD;  Location: [facility];  Service: Cardiovascular;  Laterality: Left;  . LEFT HEART CATH AND CORONARY ANGIOGRAPHY Left 02/16/2017   Procedure: LEFT HEART CATH AND CORONARY ANGIOGRAPHY;  Surgeon: Corey Skains, MD;  Location: [facility];  Service: Cardiovascular;  Laterality: Left;  . LEFT HEART CATH AND CORS/GRAFTS ANGIOGRAPHY Left 11/23/2017   Procedure: LEFT HEART CATH AND CORS/GRAFTS ANGIOGRAPHY;  Surgeon: Corey Skains, MD;  Location: [facility];  Service: Cardiovascular;  Laterality: Left;  . LEFT HEART CATH AND CORS/GRAFTS ANGIOGRAPHY N/A 02/13/2019   Procedure: LEFT HEART CATH AND CORS/GRAFTS ANGIOGRAPHY;  Surgeon: Corey Skains, MD;  Location: [facility];   Service: Cardiovascular;  Laterality: N/A;    Social History:  Ambulatory   independently       reports that she has never smoked. She has never used smokeless tobacco. She reports that she does not drink alcohol or use drugs.     Family History:   Family History  Problem Relation Age of Onset  . Diabetes Mother   . Diabetes Father   . Cancer Father   . Diabetes Brother     Allergies: Allergies  Allergen Reactions  . Penicillin G Anaphylaxis and Other (See Comments)    Has patient had a PCN reaction causing immediate rash, facial/tongue/throat swelling, SOB or lightheadedness with hypotension: TKP:[id number]} Has patient had a PCN reaction causing severe rash involving mucus membranes or skin necrosis: no:[id number]} Has patient had a PCN reaction that required hospitalization no:[id number]} Has patient had a PCN reaction occurring within the last 10 years: no:[id number]} If all of the above answers are "NO", then may proceed with Cephalosporin use.     Prior to Admission medications   Medication Sig Start Date End Date Taking? Authorizing Provider  ACCU-CHEK SOFTCLIX LANCETS lancets Use as instructed once  DAILY DIAG -E11.9 01/16/18  Yes Lavera Guise, MD  albuterol (PROVENTIL HFA;VENTOLIN HFA) 108 (90 Base) MCG/ACT inhaler Inhale 2 puffs into the lungs every 6 (six) hours as needed for wheezing or shortness of breath.   Yes [provider]  amLODipine (NORVASC) 10 MG tablet Take 1 tablet (10 mg total) by mouth daily. 03/29/19  Yes Kendell Bane, NP  aspirin EC 81 MG tablet Take 81 mg by mouth daily.   Yes [provider]  atorvastatin (LIPITOR) 20 MG tablet Take 20 mg by mouth daily. 04/05/19  Yes [provider]  bisoprolol-hydrochlorothiazide (ZIAC) 5-6.25 MG tablet Take 1 tablet by mouth daily. 09/08/18  Yes Scarboro, Audie Clear, NP  clopidogrel (PLAVIX) 75 MG tablet Take 1 tablet (75 mg total) by mouth daily. 09/08/18  Yes Scarboro, Audie Clear, NP  Dulaglutide  (TRULICITY) 1.5 XN/1.7GY SOPN Inject 1.5 mg into the skin once a week. Patient taking differently: Inject 1.5 mg into the skin every Thursday.  01/18/19  Yes Scarboro, Audie Clear, NP  furosemide (LASIX) 40 MG tablet Take 1 tablet (40 mg total) by mouth 2 (two) times daily. 09/08/18  Yes Scarboro, Audie Clear, NP  glimepiride (AMARYL) 1 MG tablet Take 1 tablet (1 mg total) by mouth daily with breakfast. 09/08/18  Yes Scarboro, Audie Clear, NP  glucose blood (ACCU-CHEK  AVIVA PLUS) test strip 1 each by Other route as needed for other. Use as instructed once  DAILY DIAG -E11.9 01/16/18  Yes Lavera Guise, MD  isosorbide mononitrate (IMDUR) 30 MG 24 hr tablet Take 1 tablet (30 mg total) by mouth daily. 03/10/19  Yes Callwood, Dwayne D, MD  losartan (COZAAR) 100 MG tablet Take one tab po qd Patient taking differently: Take 100 mg by mouth daily. Take one tab po qd 09/08/18  Yes Scarboro, Audie Clear, NP  montelukast (SINGULAIR) 10 MG tablet Take 1 tablet (10 mg total) by mouth daily. Patient taking differently: Take 10 mg by mouth at bedtime.  09/08/18  Yes Scarboro, Audie Clear, NP  oxybutynin (DITROPAN) 5 MG tablet Take 1 tablet (5 mg total) by mouth daily after supper. 09/08/18  Yes Scarboro, Audie Clear, NP  sertraline (ZOLOFT) 100 MG tablet Take 1 tablet (100 mg total) by mouth daily. Patient taking differently: Take 100 mg by mouth at bedtime.  09/08/18  Yes Scarboro, Audie Clear, NP  simvastatin (ZOCOR) 40 MG tablet Take 40 mg by mouth daily.   Yes [provider]  zolpidem (AMBIEN) 10 MG tablet Take 1 tablet (10 mg total) by mouth at bedtime. 09/08/18  Yes Scarboro, Audie Clear, NP  nitroGLYCERIN (NITROSTAT) 0.4 MG SL tablet Place 0.4 mg under the tongue every 5 (five) minutes x 3 doses as needed for chest pain. *If no relief call MD or go to Emergency Room*    [provider]   Physical Exam: Blood pressure (!) 153/94, pulse 65, temperature 98.6 F (37 C), temperature source Oral, resp. rate 16, height _0  (1.702 m), weight 108.9  kg, SpO2 100 %. 1. General:  in No Acute distress   Chronically ill  -appearing 2. Psychological: Alert and   Oriented 3. Head/ENT:    Dry Mucous Membranes                          Head Non traumatic, neck supple                            Poor Dentition 4. SKIN: normal  Skin turgor,  Skin clean Dry and intact no rash 5. Heart: Regular rate and rhythm no Murmur, no Rub or gallop Chest wall tenderness noted pain reproducible by palpation  6. Lungs:  Clear to auscultation bilaterally, no wheezes or crackles   7. Abdomen: Soft,  non-tender, Non distended  bowel sounds present 8. Lower extremities: no clubbing, cyanosis, no  edema 9. Neurologically Grossly intact, moving all 4 extremities equally  10. MSK: Normal range of motion   All other LABS:     Recent Labs  Lab 05/30/19 2006  WBC 9.5  NEUTROABS 5.7  HGB 12.6  HCT 39.0  MCV 78.8*  PLT 238     Recent Labs  Lab 05/30/19 2006  NA 140  K 3.2*  CL 104  CO2 26  GLUCOSE 175*  BUN 13  CREATININE 0.77  CALCIUM 9.1     No results for input(s): AST, ALT, ALKPHOS, BILITOT, PROT, ALBUMIN in the last 168 hours.     Cultures: No results found for: SDES, [place], CULT, REPTSTATUS   Radiological Exams on Admission: Dg Chest Portable 1 View  Result Date: 05/30/2019 CLINICAL DATA:  Sudden onset of chest pain. Shortness of breath. EXAM: PORTABLE CHEST 1 VIEW COMPARISON:  03/01/2019 FINDINGS: Post median sternotomy and CABG.  The cardiomediastinal contours are unchanged with borderline cardiomegaly. Streaky right infrahilar opacity. Pulmonary vasculature is normal. No consolidation, pleural effusion, or pneumothorax. No acute osseous abnormalities are seen. IMPRESSION: Streaky right infrahilar opacity, favor atelectasis over pneumonia. Stable borderline cardiomegaly post CABG. Electronically Signed   By: Keith Rake M.D.   On: 05/30/2019 20:13    Chart has been reviewed    Assessment/Plan   63 y.o. female with  medical history significant of CAD, HTN, DM2 HLD, asthma Admitted for Chest pain   Present on Admission: . Chest pain-reports that this is similar to her prior angina.  Pain is reproducible by palpation.  Possibly musculoskeletal troponin is unremarkable.  Slightly elevated D-dimer/fibrin derivatives no evidence of hypoxia or tachycardia.  No evidence of lower extremity swelling no history of traveling or sedentary lifestyle PE being less likely Given recent history of cardiac stent placement continue Plavix Consult cardiology in a.m.  Marland Kitchen Asthma -chronic currently stable  . Benign essential HTN -stable continue home medications except for hydrochlorothiazide  . CAD (coronary artery disease) -unclear if current chest pain is angina equivalent will consult cardiology continue Plavix hold on beta-blocker in case stress testing would be needed   . Hyperlipidemia chronic stable continue home medications  . Morbid obesity ([place]) we will need further follow-up with nutrition as an outpatient  . Type 2 diabetes mellitus with circulatory disorder, without long-term current use of insulin (HCC)  - Order Sensitive   SSI    -  check TSH and HgA1C  - Hold by mouth medications      Other plan as per orders.  DVT prophylaxis:     Lovenox     Code Status:  FULL CODE  as per patient   I had personally discussed CODE STATUS with patient    Family Communication:   Family not at  Bedside    Disposition Plan:      To home once workup is complete and patient is stable                                          Consults called:   Cards consulted send msg to Dr. Ubaldo Glassing  Admission status:  ED Disposition    ED Disposition Condition [place]: [place] [100120]  Level of Care: Telemetry [5]  Covid Evaluation: Asymptomatic Screening Protocol (No Symptoms)  Diagnosis: Chest pain [381017]  Admitting Physician: Toy Baker [[id number]]  Attending  Physician: Toy Baker [[id number]]  PT Class (Do Not Modify): Observation [104]  PT Acc Code (Do Not Modify): Observation [10022]       Obs     Level of care     tele  For  24H     Precautions:   No active isolations  PPE: Used by the provider:   P100  eye Goggles,  Gloves      Somer Trotter 05/30/2019, 10:56 PM    [facility]     after 2 AM please page floor coverage PA If 7AM-7PM, please contact the day team taking care of the patient using [email or web address]

## 2019-05-30 NOTE — ED Notes (Signed)
Xray at the bedside.

## 2019-05-30 NOTE — ED Notes (Signed)
Pt sitting in bed eyes open, reporting recurrent CP that increases with deep breathing. Pt states that initally her pain was a 10 on a 0-10 scale and has now decreased since given nitro via EMS enroute. Pt reports a pain rating of 7 on a 0-10 scale.

## 2019-05-30 NOTE — ED Provider Notes (Signed)
[facility] Emergency Department Provider Note   ____________________________________________   First MD Initiated Contact with Patient 05/30/19 1946     (approximate)  I have reviewed the triage vital signs and the nursing notes.   HISTORY  Chief Complaint Chest Pain    HPI Felicia Woods is a 63 y.o. female with past medical history of CAD status post CABG, hypertension, hyperlipidemia, and diabetes who presents to the ED complaining of chest pain.  Patient reports that she initially developed chest pain earlier this morning which felt like a heaviness sitting on the front of her chest.  It occurred at rest and was not exacerbated or alleviated by anything.  It was relatively mild at onset and she decided to take a nap, however when she woke up the pain seemed more severe.  She states it reached a 10 out of 10, when she called EMS.  She took 1 sublingual nitro at home before EMS arrived and was given additional nitroglycerin by EMS during transport.  Since then, chest pain has improved and she now rates it as a 6 out of 10.  She denies any associated fevers, cough, shortness of breath, nausea, or vomiting.  She has not recently had any pain or swelling in her legs.        Past Medical History:  Diagnosis Date  . Asthma   . Coronary artery disease   . Diabetes mellitus without complication ([place])   . Hyperlipidemia   . Hypertension   . MI (myocardial infarction) ([place])   . Migraine headache with aura   . 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    Patient Active Problem List   Diagnosis Date Noted  . Unstable angina ([place]) 02/13/2019  . Acute bilateral low back pain without sciatica 08/21/2018  . Asthma 08/21/2018  . Coronary artery disease involving coronary bypass graft of native heart without angina pectoris 08/21/2018  . Depression, major, single episode, in partial remission ([place]) 08/21/2018  . Chest pain 11/02/2017  . S/P drug eluting coronary stent  placement 02/16/2017  . Acute on chronic diastolic CHF (congestive heart failure), NYHA class 3 ([place]) 12/07/2016  . Numbness and tingling 12/01/2016  . Chronic tension-type headache, intractable 12/01/2016  . Osteoarthritis of knee 11/12/2016  . Obesity, Class II, BMI 35-39.9 05/13/2016  . Hx of CABG 2010 11/12/2015  . Type 2 diabetes mellitus with circulatory disorder, without long-term current use of insulin ([place]) 11/12/2015  . Morbid obesity ([place]) 11/12/2015  . SOBOE (shortness of breath on exertion) 11/12/2015  . RBBB 11/12/2015  . ST elevation myocardial infarction (STEMI) of inferior wall ([place]) 11/03/2015  . SVG-RCA throbectomy and DES 10/18/15   . Hypertensive heart disease without heart failure   . Vertigo   . Hyperlipidemia   . STEMI 10/18/15 10/18/2015  . Intractable chronic migraine without aura and without status migrainosus 05/08/2015  . Benign essential HTN 03/17/2015  . CAD (coronary artery disease) 08/26/2014  . Headache 08/06/2014    Past Surgical History:  Procedure Laterality Date  . ABDOMINAL HYSTERECTOMY    . CARDIAC CATHETERIZATION    . CARDIAC CATHETERIZATION N/A 10/18/2015   Procedure: Left Heart Cath and Cors/Grafts Angiography;  Surgeon: Lorretta Harp, MD;  Location: [place] [facility];  Service: Cardiovascular;  Laterality: N/A;  . CARDIAC CATHETERIZATION N/A 10/18/2015   Procedure: Coronary Stent Intervention;  Surgeon: Lorretta Harp, MD;  Location: [place] [facility];  Service: Cardiovascular;  Laterality: N/A;  . CARDIAC SURGERY    .  CHOLECYSTECTOMY    . CORONARY ANGIOPLASTY    . CORONARY ARTERY BYPASS GRAFT     4 vessels - 2010  . CORONARY STENT INTERVENTION N/A 02/16/2017   Procedure: CORONARY STENT INTERVENTION;  Surgeon: Yolonda Kida, MD;  Location: [place] [facility];  Service: Cardiovascular;  Laterality: N/A;  . CORONARY STENT INTERVENTION N/A 02/13/2019   Procedure: CORONARY STENT INTERVENTION;  Surgeon: Nelva Bush, MD;   Location: [place] [facility];  Service: Cardiovascular;  Laterality: N/A;  SVG to RCA  . CORONARY STENT INTERVENTION Left 03/08/2019   Procedure: CORONARY STENT INTERVENTION;  Surgeon: Yolonda Kida, MD;  Location: [place] [facility];  Service: Cardiovascular;  Laterality: Left;  . LEFT HEART CATH AND CORONARY ANGIOGRAPHY Left 02/16/2017   Procedure: LEFT HEART CATH AND CORONARY ANGIOGRAPHY;  Surgeon: Corey Skains, MD;  Location: [place] [facility];  Service: Cardiovascular;  Laterality: Left;  . LEFT HEART CATH AND CORS/GRAFTS ANGIOGRAPHY Left 11/23/2017   Procedure: LEFT HEART CATH AND CORS/GRAFTS ANGIOGRAPHY;  Surgeon: Corey Skains, MD;  Location: [place] [facility];  Service: Cardiovascular;  Laterality: Left;  . LEFT HEART CATH AND CORS/GRAFTS ANGIOGRAPHY N/A 02/13/2019   Procedure: LEFT HEART CATH AND CORS/GRAFTS ANGIOGRAPHY;  Surgeon: Corey Skains, MD;  Location: [place] [facility];  Service: Cardiovascular;  Laterality: N/A;    Prior to Admission medications   Medication Sig Start Date End Date Taking? Authorizing Provider  ACCU-CHEK SOFTCLIX LANCETS lancets Use as instructed once  DAILY DIAG -E11.9 01/16/18   Lavera Guise, MD  albuterol (PROVENTIL HFA;VENTOLIN HFA) 108 (90 Base) MCG/ACT inhaler Inhale 2 puffs into the lungs every 6 (six) hours as needed for wheezing or shortness of breath.    [provider]  amLODipine (NORVASC) 10 MG tablet Take 1 tablet (10 mg total) by mouth daily. 03/29/19   Kendell Bane, NP  aspirin EC 81 MG tablet Take 81 mg by mouth daily.    [provider]  bisoprolol-hydrochlorothiazide (ZIAC) 5-6.25 MG tablet Take 1 tablet by mouth daily. 09/08/18   Kendell Bane, NP  clopidogrel (PLAVIX) 75 MG tablet Take 1 tablet (75 mg total) by mouth daily. 09/08/18   Kendell Bane, NP  cyclobenzaprine (FLEXERIL) 10 MG tablet Take 1 tablet (10 mg total) by mouth 2 (two) times daily. 03/29/19   Scarboro, Audie Clear, NP   Dulaglutide (TRULICITY) 1.5 CN/4.7SJ SOPN Inject 1.5 mg into the skin once a week. Patient taking differently: Inject 1.5 mg into the skin every Thursday.  01/18/19   Kendell Bane, NP  furosemide (LASIX) 40 MG tablet Take 1 tablet (40 mg total) by mouth 2 (two) times daily. 09/08/18   Kendell Bane, NP  gabapentin (NEURONTIN) 300 MG capsule Take 300 mg by mouth 3 (three) times daily.    [provider]  glimepiride (AMARYL) 1 MG tablet Take 1 tablet (1 mg total) by mouth daily with breakfast. 09/08/18   Kendell Bane, NP  glucose blood (ACCU-CHEK AVIVA PLUS) test strip 1 each by Other route as needed for other. Use as instructed once  DAILY DIAG -E11.9 01/16/18   Lavera Guise, MD  isosorbide mononitrate (IMDUR) 30 MG 24 hr tablet Take 30 mg by mouth daily. 01/02/19   [provider]  isosorbide mononitrate (IMDUR) 30 MG 24 hr tablet Take 1 tablet (30 mg total) by mouth daily. 03/10/19   Callwood, Dwayne D, MD  losartan (COZAAR) 100 MG tablet Take one  tab po qd Patient taking differently: Take 100 mg by mouth daily. Take one tab po qd 09/08/18   Kendell Bane, NP  montelukast (SINGULAIR) 10 MG tablet Take 1 tablet (10 mg total) by mouth daily. Patient taking differently: Take 10 mg by mouth at bedtime.  09/08/18   Kendell Bane, NP  nitroGLYCERIN (NITROSTAT) 0.4 MG SL tablet Place 0.4 mg under the tongue every 5 (five) minutes x 3 doses as needed for chest pain. *If no relief call MD or go to Emergency Room*    [provider]  ondansetron (ZOFRAN ODT) 4 MG disintegrating tablet Take 1 tablet (4 mg total) by mouth 2 (two) times daily before a meal. 04/03/19   Scarboro, Audie Clear, NP  oxybutynin (DITROPAN) 5 MG tablet Take 1 tablet (5 mg total) by mouth daily after supper. 09/08/18   Kendell Bane, NP  sertraline (ZOLOFT) 100 MG tablet Take 1 tablet (100 mg total) by mouth daily. Patient taking differently: Take 100 mg by mouth at bedtime.  09/08/18   Kendell Bane, NP   simvastatin (ZOCOR) 40 MG tablet Take 40 mg by mouth daily.    [provider]  zolpidem (AMBIEN) 10 MG tablet Take 1 tablet (10 mg total) by mouth at bedtime. 09/08/18   Kendell Bane, NP    Allergies Penicillin g  Family History  Problem Relation Age of Onset  . Diabetes Mother   . Diabetes Father   . Cancer Father   . Diabetes Brother     Social History Social History   Tobacco Use  . Smoking status: Never Smoker  . Smokeless tobacco: Never Used  Substance Use Topics  . Alcohol use: No    Alcohol/week: 0.0 standard drinks  . Drug use: No    Review of Systems  Constitutional: No fever/chills Eyes: No visual changes. ENT: No sore throat. Cardiovascular: Positive for chest pain. Respiratory: Denies shortness of breath. Gastrointestinal: No abdominal pain.  No nausea, no vomiting.  No diarrhea.  No constipation. Genitourinary: Negative for dysuria. Musculoskeletal: Negative for back pain. Skin: Negative for rash. Neurological: Negative for headaches, focal weakness or numbness.  ____________________________________________   PHYSICAL EXAM:  VITAL SIGNS: ED Triage Vitals  Enc Vitals Group     BP      Pulse      Resp      Temp      Temp src      SpO2      Weight      Height      Head Circumference      Peak Flow      Pain Score      Pain Loc      Pain Edu?      Excl. in [place]?     Constitutional: Alert and oriented. Eyes: Conjunctivae are normal. Head: Atraumatic. Nose: No congestion/rhinnorhea. Mouth/Throat: Mucous membranes are moist. Neck: Normal ROM Cardiovascular: Normal rate, regular rhythm. Grossly normal heart sounds. Respiratory: Normal respiratory effort.  No retractions. Lungs CTAB. Gastrointestinal: Soft and nontender. No distention. Genitourinary: deferred Musculoskeletal: No lower extremity tenderness nor edema. Neurologic:  Normal speech and language. No gross focal neurologic deficits are appreciated. Skin:  Skin is  warm, dry and intact. No rash noted. Psychiatric: Mood and affect are normal. Speech and behavior are normal.  ____________________________________________   LABS (all labs ordered are listed, but only abnormal results are displayed)  Labs Reviewed  BASIC METABOLIC PANEL - Abnormal; Notable for the following  components:      Result Value   Potassium 3.2 (*)    Glucose, Bld 175 (*)    All other components within normal limits  CBC WITH DIFFERENTIAL/PLATELET - Abnormal; Notable for the following components:   MCV 78.8 (*)    MCH 25.5 (*)    RDW 16.4 (*)    All other components within normal limits  TROPONIN I (HIGH SENSITIVITY)   ____________________________________________  EKG  ED ECG REPORT I, Blake Divine, the attending physician, personally viewed and interpreted this ECG.   Date: 05/30/2019  EKG Time: 19:54  Rate: 74  Rhythm: normal sinus rhythm  Axis: LAD  Intervals:right bundle branch block  ST&T Change: Q waves inferiorly, old   PROCEDURES  Procedure(s) performed (including Critical Care):  Procedures   ____________________________________________   INITIAL IMPRESSION / ASSESSMENT AND PLAN / ED COURSE       63 year old female with history of CAD status post CABG presents to the ED with chest pressure progressively worsening throughout the day today.  She received nitroglycerin and aspirin prior to arrival, now states that chest pain is improved but still present.  She declines any further sublingual nitro due to headache but is willing to try Nitropaste.  Her EKG does not show any acute ischemic changes, will check troponin.  She is high risk with a heart score greater than 4, will likely require admission for further evaluation for ACS.  Do not suspect PE or dissection.  Chest x-ray shows atelectasis versus infiltrate, favor atelectasis given she has no fevers or cough.  Troponin within normal limits, patient's chest pain remains improved.  We will plan  on admission for high risk chest pain.      ____________________________________________   FINAL CLINICAL IMPRESSION(S) / ED DIAGNOSES  Final diagnoses:  Nonspecific chest pain  S/P CABG (coronary artery bypass graft)     ED Discharge Orders    None       Note:  This document was prepared using Dragon voice recognition software and may include unintentional dictation errors.   Blake Divine, MD 05/30/19 2116

## 2019-05-30 NOTE — ED Notes (Signed)
Pt sitting in bed with husband at bedside. No concerns for staff at this time. Will continue to monitor

## 2019-05-30 NOTE — ED Notes (Signed)
EDP at bedside  

## 2019-05-30 NOTE — ED Notes (Signed)
ED TO INPATIENT HANDOFF REPORT  ED Nurse Name and Phone #: Valetta Fuller [phone]  S Name/Age/Gender Felicia Woods 63 y.o. female Room/Bed: ED18A/ED18A  Code Status   Code Status: Prior  Home/SNF/Other Home Patient oriented to: self, place, time and situation Is this baseline? Yes   Triage Complete: Triage complete  Chief Complaint Chest Pain  Triage Note Pt arrived via EMS from home with c/o sudden onset of chest pain since this morning. Pt states her pain never improved so she called EMS this evening. Hx of multiple MI and cardiac stent placement. +SOB. Hypertensive upon EMS arrival which improved after nitro spray. Nitro received at 1800 and again at 66. Pt reports no improvement with chest pain after nitro. 4 baby aspirin received prior to arrival.    Allergies Allergies  Allergen Reactions  . Penicillin G Anaphylaxis and Other (See Comments)    Has patient had a PCN reaction causing immediate rash, facial/tongue/throat swelling, SOB or lightheadedness with hypotension: RAX:[id number]} Has patient had a PCN reaction causing severe rash involving mucus membranes or skin necrosis: no:[id number]} Has patient had a PCN reaction that required hospitalization no:[id number]} Has patient had a PCN reaction occurring within the last 10 years: no:[id number]} If all of the above answers are "NO", then may proceed with Cephalosporin use.    Level of Care/Admitting Diagnosis ED Disposition    ED Disposition Condition [facility] Area: [place] [[id number]]  Level of Care: Telemetry [5]  Covid Evaluation: Asymptomatic Screening Protocol (No Symptoms)  Diagnosis: Chest pain [881103]  Admitting Physician: Toy Baker [[id number]]  Attending Physician: Toy Baker [[id number]]  PT Class (Do Not Modify): Observation [104]  PT Acc Code (Do Not Modify): Observation [10022]       B Medical/Surgery History Past Medical History:  Diagnosis Date  . Asthma    . Coronary artery disease   . Diabetes mellitus without complication ([place])   . Hyperlipidemia   . Hypertension   . MI (myocardial infarction) ([place])   . Migraine headache with aura   . 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   Past Surgical History:  Procedure Laterality Date  . ABDOMINAL HYSTERECTOMY    . CARDIAC CATHETERIZATION    . CARDIAC CATHETERIZATION N/A 10/18/2015   Procedure: Left Heart Cath and Cors/Grafts Angiography;  Surgeon: Lorretta Harp, MD;  Location: [place] [facility];  Service: Cardiovascular;  Laterality: N/A;  . CARDIAC CATHETERIZATION N/A 10/18/2015   Procedure: Coronary Stent Intervention;  Surgeon: Lorretta Harp, MD;  Location: [place] [facility];  Service: Cardiovascular;  Laterality: N/A;  . CARDIAC SURGERY    . CHOLECYSTECTOMY    . CORONARY ANGIOPLASTY    . CORONARY ARTERY BYPASS GRAFT     4 vessels - 2010  . CORONARY STENT INTERVENTION N/A 02/16/2017   Procedure: CORONARY STENT INTERVENTION;  Surgeon: Yolonda Kida, MD;  Location: [place] [facility];  Service: Cardiovascular;  Laterality: N/A;  . CORONARY STENT INTERVENTION N/A 02/13/2019   Procedure: CORONARY STENT INTERVENTION;  Surgeon: Nelva Bush, MD;  Location: [place] [facility];  Service: Cardiovascular;  Laterality: N/A;  SVG to RCA  . CORONARY STENT INTERVENTION Left 03/08/2019   Procedure: CORONARY STENT INTERVENTION;  Surgeon: Yolonda Kida, MD;  Location: [place] [facility];  Service: Cardiovascular;  Laterality: Left;  . LEFT HEART CATH AND CORONARY ANGIOGRAPHY Left 02/16/2017   Procedure: LEFT HEART CATH AND CORONARY ANGIOGRAPHY;  Surgeon: Corey Skains, MD;  Location: [facility];  Service: Cardiovascular;  Laterality: Left;  . LEFT HEART CATH AND CORS/GRAFTS ANGIOGRAPHY Left 11/23/2017   Procedure: LEFT HEART CATH AND CORS/GRAFTS ANGIOGRAPHY;  Surgeon: Corey Skains, MD;  Location: [place] [facility];  Service: Cardiovascular;  Laterality: Left;  . LEFT  HEART CATH AND CORS/GRAFTS ANGIOGRAPHY N/A 02/13/2019   Procedure: LEFT HEART CATH AND CORS/GRAFTS ANGIOGRAPHY;  Surgeon: Corey Skains, MD;  Location: [place] [facility];  Service: Cardiovascular;  Laterality: N/A;     A IV Location/Drains/Wounds Patient Lines/Drains/Airways Status   Active Line/Drains/Airways    Name:   Placement date:   Placement time:   Site:   Days:   Peripheral IV 05/30/19 Left Hand   05/30/19    2022    Hand   less than 1          Intake/Output Last 24 hours No intake or output data in the 24 hours ending 05/30/19 2305  Labs/Imaging Results for orders placed or performed during the hospital encounter of 05/30/19 (from the past 48 hour(s))  Basic metabolic panel     Status: Abnormal   Collection Time: 05/30/19  8:06 PM  Result Value Ref Range   Sodium 140 135 - 145 mmol/L   Potassium 3.2 (L) 3.5 - 5.1 mmol/L   Chloride 104 98 - 111 mmol/L   CO2 26 22 - 32 mmol/L   Glucose, Bld 175 (H) 70 - 99 mg/dL   BUN 13 8 - 23 mg/dL   Creatinine, Ser 0.77 0.44 - 1.00 mg/dL   Calcium 9.1 8.9 - 10.3 mg/dL   GFR calc non Af Amer >60 >60 mL/min   GFR calc Af Amer >60 >60 mL/min   Anion gap 10 5 - 15    Comment: Performed at [facility], [address] [place], [place] [address]  CBC with Differential     Status: Abnormal   Collection Time: 05/30/19  8:06 PM  Result Value Ref Range   WBC 9.5 4.0 - 10.5 K/uL   RBC 4.95 3.87 - 5.11 MIL/uL   Hemoglobin 12.6 12.0 - 15.0 g/dL   HCT 39.0 36.0 - 46.0 %   MCV 78.8 (L) 80.0 - 100.0 fL   MCH 25.5 (L) 26.0 - 34.0 pg   MCHC 32.3 30.0 - 36.0 g/dL   RDW 16.4 (H) 11.5 - 15.5 %   Platelets 238 150 - 400 K/uL   nRBC 0.0 0.0 - 0.2 %   Neutrophils Relative % 60 %   Neutro Abs 5.7 1.7 - 7.7 K/uL   Lymphocytes Relative 32 %   Lymphs Abs 3.1 0.7 - 4.0 K/uL   Monocytes Relative 6 %   Monocytes Absolute 0.6 0.1 - 1.0 K/uL   Eosinophils Relative 2 %   Eosinophils Absolute 0.2 0.0 - 0.5 K/uL   Basophils Relative 0  %   Basophils Absolute 0.0 0.0 - 0.1 K/uL   Immature Granulocytes 0 %   Abs Immature Granulocytes 0.02 0.00 - 0.07 K/uL    Comment: Performed at [facility], [address] [place], [place] [address]  Troponin I (High Sensitivity)     Status: None   Collection Time: 05/30/19  8:06 PM  Result Value Ref Range   Troponin I (High Sensitivity) 5 <18 ng/L    Comment: (NOTE) Elevated high sensitivity troponin I (hsTnI) values and significant  changes across serial measurements may suggest ACS but many other  chronic and acute conditions are known to elevate hsTnI  results.  Refer to the "Links" section for chest pain algorithms and additional  guidance. Performed at [facility], [place], [place] [address]   Troponin I (High Sensitivity)     Status: None   Collection Time: 05/30/19  9:49 PM  Result Value Ref Range   Troponin I (High Sensitivity) 6 <18 ng/L    Comment: (NOTE) Elevated high sensitivity troponin I (hsTnI) values and significant  changes across serial measurements may suggest ACS but many other  chronic and acute conditions are known to elevate hsTnI results.  Refer to the "Links" section for chest pain algorithms and additional  guidance. Performed at [facility], [address], [place], [place] [address]    Dg Chest Portable 1 View  Result Date: 05/30/2019 CLINICAL DATA:  Sudden onset of chest pain. Shortness of breath. EXAM: PORTABLE CHEST 1 VIEW COMPARISON:  03/01/2019 FINDINGS: Post median sternotomy and CABG. The cardiomediastinal contours are unchanged with borderline cardiomegaly. Streaky right infrahilar opacity. Pulmonary vasculature is normal. No consolidation, pleural effusion, or pneumothorax. No acute osseous abnormalities are seen. IMPRESSION: Streaky right infrahilar opacity, favor atelectasis over pneumonia. Stable borderline cardiomegaly post CABG. Electronically Signed   By: Keith Rake M.D.   On:  05/30/2019 20:13    Pending Labs Unresulted Labs (From admission, onward)    Start     Ordered   05/30/19 2257  Magnesium  Add-on,   AD     05/30/19 2256   05/30/19 2246  Fibrin derivatives D-Dimer ([facility] only)  Once,   STAT     05/30/19 2245   05/30/19 2132  SARS CORONAVIRUS 2 (TAT 6-24 HRS) Nasopharyngeal Nasopharyngeal Swab  (Asymptomatic/Tier 3)  Once,   STAT    Question Answer Comment  Is this test for diagnosis or screening Screening   Symptomatic for COVID-19 as defined by CDC No   Hospitalized for COVID-19 No   Admitted to ICU for COVID-19 No   Previously tested for COVID-19 Yes   Resident in a congregate (group) care setting No   Employed in healthcare setting No   Pregnant No      05/30/19 2131   Signed and Held  HIV Antibody (routine testing w rflx)  (HIV Antibody (Routine testing w reflex) panel)  Tomorrow morning,   R     Signed and Held   Signed and Held  Magnesium  Tomorrow morning,   R    Comments: Call MD if <1.5    Signed and Held   Signed and Held  Phosphorus  Tomorrow morning,   R     Signed and Held   Signed and Held  TSH  Once,   R    Comments: Cancel if already done within 1 month and notify MD    Signed and Held   Signed and Held  Comprehensive metabolic panel  Once,   R    Comments: Cal MD for K<3.5 or >5.0    Signed and Held   Signed and Held  CBC  Once,   R    Comments: Call for hg <8.0    Signed and Held          Vitals/Pain Today's Vitals   05/30/19 2100 05/30/19 2130 05/30/19 2200 05/30/19 2230  BP: (!) 158/84 (!) 157/91 (!) 143/80 (!) 153/94  Pulse: 63 66 66 65  Resp: (!) 25 (!) 24 (!) 22 16  Temp:      TempSrc:      SpO2: 96%  98% 99% 100%  Weight:      Height:      PainSc:        Isolation Precautions No active isolations  Medications Medications  potassium chloride SA (KLOR-CON) CR tablet 40 mEq (has no administration in time range)  nitroGLYCERIN (NITROGLYN) 2 % ointment 1 inch (1 inch Topical Given 05/30/19 2144)     Mobility walks Low fall risk   Focused Assessments Cardiac Assessment Handoff:  Cardiac Rhythm: Normal sinus rhythm Lab Results  Component Value Date   TROPONINI <0.03 12/13/2017   No results found for: DDIMER Does the Patient currently have chest pain? No     R Recommendations: See Admitting Provider Note  Report given to:   Additional Notes:

## 2019-05-31 ENCOUNTER — Observation Stay
Admit: 2019-05-31 | Discharge: 2019-05-31 | Disposition: A | Discharge status: Home / Self Care | Payer: Medicare Other | Attending: Internal Medicine | Admitting: Internal Medicine

## 2019-05-31 DIAGNOSIS — J4520 Mild intermittent asthma, uncomplicated: Secondary | ICD-10-CM | POA: Diagnosis not present

## 2019-05-31 DIAGNOSIS — I10 Essential (primary) hypertension: Secondary | ICD-10-CM | POA: Diagnosis not present

## 2019-05-31 DIAGNOSIS — R079 Chest pain, unspecified: Secondary | ICD-10-CM | POA: Diagnosis not present

## 2019-05-31 DIAGNOSIS — I2510 Atherosclerotic heart disease of native coronary artery without angina pectoris: Secondary | ICD-10-CM | POA: Diagnosis not present

## 2019-05-31 DIAGNOSIS — R0789 Other chest pain: Secondary | ICD-10-CM | POA: Diagnosis not present

## 2019-05-31 LAB — GLUCOSE, CAPILLARY
Glucose-Capillary: 102 mg/dL — ABNORMAL HIGH (ref 70–99)
Glucose-Capillary: 109 mg/dL — ABNORMAL HIGH (ref 70–99)
Glucose-Capillary: 104 mg/dL — ABNORMAL HIGH (ref 70–99)

## 2019-05-31 LAB — TROPONIN I (HIGH SENSITIVITY)
Troponin I (High Sensitivity): 6 ng/L (ref <18)
Troponin I (High Sensitivity): 6 ng/L (ref <18)

## 2019-05-31 LAB — COMPREHENSIVE METABOLIC PANEL
ALT: 12 U/L (ref 0–44)
AST: 20 U/L (ref 15–41)
Albumin: 3.4 g/dL — ABNORMAL LOW (ref 3.5–5.0)
Alkaline Phosphatase: 54 U/L (ref 38–126)
Anion gap: 10 (ref 5–15)
BUN: 13 mg/dL (ref 8–23)
CO2: 25 mmol/L (ref 22–32)
Calcium: 8.8 mg/dL — ABNORMAL LOW (ref 8.9–10.3)
Chloride: 105 mmol/L (ref 98–111)
Creatinine, Ser: 0.69 mg/dL (ref 0.44–1.00)
GFR calc Af Amer: >60 mL/min (ref >60)
GFR calc non Af Amer: >60 mL/min (ref >60)
Glucose, Bld: 210 mg/dL — ABNORMAL HIGH (ref 70–99)
Potassium: 3.1 mmol/L — ABNORMAL LOW (ref 3.5–5.1)
Sodium: 140 mmol/L (ref 135–145)
Total Bilirubin: 0.5 mg/dL (ref 0.3–1.2)
Total Protein: 7 g/dL (ref 6.5–8.1)

## 2019-05-31 LAB — SARS CORONAVIRUS 2 (TAT 6-24 HRS): SARS Coronavirus 2: NEGATIVE

## 2019-05-31 LAB — CBC
HCT: 36 % (ref 36.0–46.0)
Hemoglobin: 11.3 g/dL — ABNORMAL LOW (ref 12.0–15.0)
MCH: 25.1 pg — ABNORMAL LOW (ref 26.0–34.0)
MCHC: 31.4 g/dL (ref 30.0–36.0)
MCV: 79.8 fL — ABNORMAL LOW (ref 80.0–100.0)
Platelets: 217 10*3/uL (ref 150–400)
RBC: 4.51 MIL/uL (ref 3.87–5.11)
RDW: 16.4 % — ABNORMAL HIGH (ref 11.5–15.5)
WBC: 7.5 10*3/uL (ref 4.0–10.5)
nRBC: 0 % (ref 0.0–0.2)

## 2019-05-31 LAB — HEMOGLOBIN A1C
Hgb A1c MFr Bld: 7.3 % — ABNORMAL HIGH (ref 4.8–5.6)
Mean Plasma Glucose: 162.81 mg/dL

## 2019-05-31 LAB — ECHOCARDIOGRAM COMPLETE
Height: 67 in
Weight: 4068.8 oz

## 2019-05-31 LAB — TSH: TSH: 5.421 u[IU]/mL — ABNORMAL HIGH (ref 0.350–4.500)

## 2019-05-31 LAB — HIV ANTIBODY (ROUTINE TESTING W REFLEX): HIV Screen 4th Generation wRfx: REACTIVE — AB

## 2019-05-31 LAB — MAGNESIUM: Magnesium: 1.8 mg/dL (ref 1.7–2.4)

## 2019-05-31 LAB — PHOSPHORUS: Phosphorus: 3.5 mg/dL (ref 2.5–4.6)

## 2019-05-31 MED ORDER — INSULIN ASPART 100 UNIT/ML ~~LOC~~ SOLN: 0.0000 [IU] | SUBCUTANEOUS | Status: DC

## 2019-05-31 MED ORDER — INSULIN ASPART 100 UNIT/ML ~~LOC~~ SOLN: 0.0000 [IU] | Freq: Three times a day (TID) | SUBCUTANEOUS | Status: DC

## 2019-05-31 NOTE — Progress Notes (Signed)
*  PRELIMINARY RESULTS* Echocardiogram 2D Echocardiogram has been performed.  Felicia Woods 05/31/2019, 8:19 AM

## 2019-05-31 NOTE — Plan of Care (Signed)
  Problem: Education: Goal: Knowledge of General Education information will improve Description: Including pain rating scale, medication(s)/side effects and non-pharmacologic comfort measures Outcome: Progressing   Problem: Activity: Goal: Risk for activity intolerance will decrease Outcome: Progressing   Problem: Coping: Goal: Level of anxiety will decrease Outcome: Progressing   Problem: Education: Goal: Understanding of cardiac disease, CV risk reduction, and recovery process will improve Outcome: Progressing   Problem: Activity: Goal: Ability to tolerate increased activity will improve Outcome: Progressing

## 2019-05-31 NOTE — Evaluation (Signed)
Physical Therapy Evaluation Patient Details Name: Felicia Woods MRN: [id number] DOB: 08-01-1955 Today's Date: 05/31/2019   History of Present Illness  Pt admitted for hyperlipidemia with complaints of chest pain. History includes MI and cardiac stent placement.  Clinical Impression  Pt is a pleasant 63 year old female who was admitted for hyperlipidemia with complaints of chest pain. Pt demonstrates all bed mobility/transfers/ambulation at baseline level. Pt motivated to participate in PT eval as she wants to dc today. Per doctor, she has to ambulate in hallway prior to discharge. Pt does not require any further PT needs at this time. Pt will be dc in house and does not require follow up. RN aware. Will dc current orders.     Follow Up Recommendations No PT follow up    Equipment Recommendations  None recommended by PT    Recommendations for Other Services       Precautions / Restrictions Precautions Precautions: Fall Restrictions Weight Bearing Restrictions: No      Mobility  Bed Mobility Overal bed mobility: Independent             General bed mobility comments: safe technique  Transfers Overall transfer level: Independent Equipment used: None             General transfer comment: safe technique with upright posture noted  Ambulation/Gait Ambulation/Gait assistance: Independent Gait Distance (Feet): 350 Feet Assistive device: None Gait Pattern/deviations: WFL(Within Functional Limits)     General Gait Details: ambulated around RN station twice with safe technique and reciprocal gait pattern. Able to carry conversation during ambulation. HR at 104bpm post exertion  Stairs            Wheelchair Mobility    Modified Rankin (Stroke Patients Only)       Balance Overall balance assessment: Independent                                           Pertinent Vitals/Pain Pain Assessment: No/denies pain    Home Living  Family/patient expects to be discharged to:: Private residence Living Arrangements: Spouse/significant other Available Help at Discharge: Family Type of Home: House Home Access: Stairs to enter Entrance Stairs-Rails: Can reach both Entrance Stairs-Number of Steps: 2 Home Layout: One level Home Equipment: None      Prior Function Level of Independence: Independent               Hand Dominance        Extremity/Trunk Assessment   Upper Extremity Assessment Upper Extremity Assessment: Overall WFL for tasks assessed    Lower Extremity Assessment Lower Extremity Assessment: Overall WFL for tasks assessed       Communication   Communication: No difficulties  Cognition Arousal/Alertness: Awake/alert Behavior During Therapy: WFL for tasks assessed/performed Overall Cognitive Status: Within Functional Limits for tasks assessed                                        General Comments      Exercises Other Exercises Other Exercises: ambulated to bathroom with independence. Able to perform transfers and hand hygiene safely   Assessment/Plan    PT Assessment Patent does not need any further PT services  PT Problem List         PT Treatment Interventions  PT Goals (Current goals can be found in the Care Plan section)  Acute Rehab PT Goals Patient Stated Goal: to go home PT Goal Formulation: All assessment and education complete, DC therapy Time For Goal Achievement: 05/31/19 Potential to Achieve Goals: Good    Frequency     Barriers to discharge        Co-evaluation               AM-PAC PT "6 Clicks" Mobility  Outcome Measure Help needed turning from your back to your side while in a flat bed without using bedrails?: None Help needed moving from lying on your back to sitting on the side of a flat bed without using bedrails?: None Help needed moving to and from a bed to a chair (including a wheelchair)?: None Help needed standing  up from a chair using your arms (e.g., wheelchair or bedside chair)?: None Help needed to walk in hospital room?: None Help needed climbing 3-5 steps with a railing? : None 6 Click Score: 24    End of Session   Activity Tolerance: Patient tolerated treatment well Patient left: in chair Nurse Communication: Mobility status PT Visit Diagnosis: Pain Pain - Right/Left: (chest pain) Pain - part of body: (chest)    Time: [id number] PT Time Calculation (min) (ACUTE ONLY): 18 min   Charges:   PT Evaluation $PT Eval Low Complexity: 1 Low PT Treatments $Therapeutic Activity: 8-22 mins        Greggory Stallion, PT, DPT [phone]   Felicia Woods 05/31/2019, 11:08 AM

## 2019-05-31 NOTE — Consult Note (Signed)
[facility] Cardiology  CARDIOLOGY CONSULT NOTE  Patient ID: Felicia Woods MRN: [id number] DOB/AGE: 1956-01-29 63 y.o.  Admit date: 05/30/2019 Referring Physician Posey Pronto Primary Physician [facility] Primary Cardiologist Nehemiah Massed Reason for Consultation chest pain  HPI: 63 year old female referred for evaluation of chest pain.  The patient has known coronary disease, status post CABG and coronary stents.  She presented to [facility] emergency room 05/30/2019 sharp substernal chest discomfort.  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  The patient was admitted to telemetry where she has had no recurrent chest pain.  She has ruled out for myocardial infarction with negative high-sensitivity troponin (6, 6, 6, 6 ).  This morning, patient feels well, wishes to go home.  The patient has known coronary artery disease, status post CABG.  Cardiac catheterization 02/13/2019 with patent LIMA to LAD, 70% stenosis SVG to OM 2, patent stent SVG to DG, and 90% stenosis RCA.  The patient underwent PCI even DES x2 in the ostium/proximal/mid RCA.  Patient returned with acute coronary syndrome 03/08/2019 at which time stent and SVG to RCA was subtotaled.  The patient underwent PCI with DES SVG to OM 2.  Review of systems complete and found to be negative unless listed above     Past Medical History:  Diagnosis Date  . Asthma   . Coronary artery disease   . Diabetes mellitus without complication ([place])   . Hyperlipidemia   . Hypertension   . MI (myocardial infarction) ([place])   . Migraine headache with aura   . 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    Past Surgical History:  Procedure Laterality Date  . ABDOMINAL HYSTERECTOMY    . CARDIAC CATHETERIZATION    . CARDIAC CATHETERIZATION N/A 10/18/2015   Procedure: Left Heart Cath and Cors/Grafts Angiography;  Surgeon: Lorretta Harp, MD;  Location: [place] [facility];  Service: Cardiovascular;  Laterality: N/A;  . CARDIAC CATHETERIZATION  N/A 10/18/2015   Procedure: Coronary Stent Intervention;  Surgeon: Lorretta Harp, MD;  Location: [place] [facility];  Service: Cardiovascular;  Laterality: N/A;  . CARDIAC SURGERY    . CHOLECYSTECTOMY    . CORONARY ANGIOPLASTY    . CORONARY ARTERY BYPASS GRAFT     4 vessels - 2010  . CORONARY STENT INTERVENTION N/A 02/16/2017   Procedure: CORONARY STENT INTERVENTION;  Surgeon: Yolonda Kida, MD;  Location: [place] [facility];  Service: Cardiovascular;  Laterality: N/A;  . CORONARY STENT INTERVENTION N/A 02/13/2019   Procedure: CORONARY STENT INTERVENTION;  Surgeon: Nelva Bush, MD;  Location: [place] [facility];  Service: Cardiovascular;  Laterality: N/A;  SVG to RCA  . CORONARY STENT INTERVENTION Left 03/08/2019   Procedure: CORONARY STENT INTERVENTION;  Surgeon: Yolonda Kida, MD;  Location: [place] [facility];  Service: Cardiovascular;  Laterality: Left;  . LEFT HEART CATH AND CORONARY ANGIOGRAPHY Left 02/16/2017   Procedure: LEFT HEART CATH AND CORONARY ANGIOGRAPHY;  Surgeon: Corey Skains, MD;  Location: [place] [facility];  Service: Cardiovascular;  Laterality: Left;  . LEFT HEART CATH AND CORS/GRAFTS ANGIOGRAPHY Left 11/23/2017   Procedure: LEFT HEART CATH AND CORS/GRAFTS ANGIOGRAPHY;  Surgeon: Corey Skains, MD;  Location: [place] [facility];  Service: Cardiovascular;  Laterality: Left;  . LEFT HEART CATH AND CORS/GRAFTS ANGIOGRAPHY N/A 02/13/2019   Procedure: LEFT HEART CATH AND CORS/GRAFTS ANGIOGRAPHY;  Surgeon: Corey Skains, MD;  Location: [place] [facility];  Service: Cardiovascular;  Laterality: N/A;  Medications Prior to Admission  Medication Sig Dispense Refill Last Dose  . ACCU-CHEK SOFTCLIX LANCETS lancets Use as instructed once  DAILY DIAG -E11.9 100 each 3 05/30/2019 at Unknown time  . albuterol (PROVENTIL HFA;VENTOLIN HFA) 108 (90 Base) MCG/ACT inhaler Inhale 2 puffs into the lungs every 6 (six) hours as needed for wheezing or shortness  of breath.   prn at prn  . amLODipine (NORVASC) 10 MG tablet Take 1 tablet (10 mg total) by mouth daily. 90 tablet 1 05/30/2019 at Unknown time  . aspirin EC 81 MG tablet Take 81 mg by mouth daily.   05/30/2019 at Unknown time  . atorvastatin (LIPITOR) 20 MG tablet Take 20 mg by mouth daily.   05/30/2019 at Unknown time  . bisoprolol-hydrochlorothiazide (ZIAC) 5-6.25 MG tablet Take 1 tablet by mouth daily. 90 tablet 2 05/30/2019 at Unknown time  . clopidogrel (PLAVIX) 75 MG tablet Take 1 tablet (75 mg total) by mouth daily. 90 tablet 3 05/30/2019 at Unknown time  . Dulaglutide (TRULICITY) 1.5 IO/9.7DZ SOPN Inject 1.5 mg into the skin once a week. (Patient taking differently: Inject 1.5 mg into the skin every Thursday. ) 12 pen 2 Past Week at Unknown time  . furosemide (LASIX) 40 MG tablet Take 1 tablet (40 mg total) by mouth 2 (two) times daily. 180 tablet 1 05/30/2019 at Unknown time  . glimepiride (AMARYL) 1 MG tablet Take 1 tablet (1 mg total) by mouth daily with breakfast. 90 tablet 2 05/30/2019 at Unknown time  . glucose blood (ACCU-CHEK AVIVA PLUS) test strip 1 each by Other route as needed for other. Use as instructed once  DAILY DIAG -E11.9 100 each 3 05/30/2019 at Unknown time  . isosorbide mononitrate (IMDUR) 30 MG 24 hr tablet Take 1 tablet (30 mg total) by mouth daily. 90 tablet 4 05/30/2019 at Unknown time  . losartan (COZAAR) 100 MG tablet Take one tab po qd (Patient taking differently: Take 100 mg by mouth daily. Take one tab po qd) 90 tablet 3 05/30/2019 at Unknown time  . montelukast (SINGULAIR) 10 MG tablet Take 1 tablet (10 mg total) by mouth daily. (Patient taking differently: Take 10 mg by mouth at bedtime. ) 90 tablet 1 05/29/2019 at Unknown time  . oxybutynin (DITROPAN) 5 MG tablet Take 1 tablet (5 mg total) by mouth daily after supper. 90 tablet 1 05/30/2019 at Unknown time  . sertraline (ZOLOFT) 100 MG tablet Take 1 tablet (100 mg total) by mouth daily. (Patient taking  differently: Take 100 mg by mouth at bedtime. ) 90 tablet 3 05/29/2019 at Unknown time  . simvastatin (ZOCOR) 40 MG tablet Take 40 mg by mouth daily.   05/30/2019 at Unknown time  . zolpidem (AMBIEN) 10 MG tablet Take 1 tablet (10 mg total) by mouth at bedtime. 90 tablet 1 05/29/2019 at Unknown time  . nitroGLYCERIN (NITROSTAT) 0.4 MG SL tablet Place 0.4 mg under the tongue every 5 (five) minutes x 3 doses as needed for chest pain. *If no relief call MD or go to Emergency Room*   prn at prn   Social History   Socioeconomic History  . Marital status: Married    Spouse name: Jeneen Rinks  . Number of children: Not on file  . Years of education: Not on file  . Highest education level: Not on file  Occupational History  . Not on file  Social Needs  . Financial resource strain: Not on file  . Food insecurity    Worry: Not on  file    Inability: Not on file  . Transportation needs    Medical: Not on file    Non-medical: Not on file  Tobacco Use  . Smoking status: Never Smoker  . Smokeless tobacco: Never Used  Substance and Sexual Activity  . Alcohol use: No    Alcohol/week: 0.0 standard drinks  . Drug use: No  . Sexual activity: Yes  Lifestyle  . Physical activity    Days per week: Not on file    Minutes per session: Not on file  . Stress: Not on file  Relationships  . Social Herbalist on phone: Not on file    Gets together: Not on file    Attends religious service: Not on file    Active member of club or organization: Not on file    Attends meetings of clubs or organizations: Not on file    Relationship status: Not on file  . Intimate partner violence    Fear of current or ex partner: Not on file    Emotionally abused: Not on file    Physically abused: Not on file    Forced sexual activity: Not on file  Other Topics Concern  . Not on file  Social History Narrative  . Not on file    Family History  Problem Relation Age of Onset  . Diabetes Mother   . Diabetes  Father   . Cancer Father   . Diabetes Brother       Review of systems complete and found to be negative unless listed above      PHYSICAL EXAM  General: Well developed, well nourished, in no acute distress HEENT:  Normocephalic and atramatic Neck:  No JVD.  Lungs: Clear bilaterally to auscultation and percussion. Heart: HRRR . Normal S1 and S2 without gallops or murmurs.  Abdomen: Bowel sounds are positive, abdomen soft and non-tender  Msk:  Back normal, normal gait. Normal strength and tone for age. Extremities: No clubbing, cyanosis or edema.   Neuro: Alert and oriented X 3. Psych:  Good affect, responds appropriately  Labs:   Lab Results  Component Value Date   WBC 7.5 05/31/2019   HGB 11.3 (L) 05/31/2019   HCT 36.0 05/31/2019   MCV 79.8 (L) 05/31/2019   PLT 217 05/31/2019    Recent Labs  Lab 05/31/19 0156  NA 140  K 3.1*  CL 105  CO2 25  BUN 13  CREATININE 0.69  CALCIUM 8.8*  PROT 7.0  BILITOT 0.5  ALKPHOS 54  ALT 12  AST 20  GLUCOSE 210*   Lab Results  Component Value Date   TROPONINI <0.03 12/13/2017    Lab Results  Component Value Date   CHOL 153 10/20/2015   Lab Results  Component Value Date   HDL 31 (L) 10/20/2015   Lab Results  Component Value Date   LDLCALC 97 10/20/2015   Lab Results  Component Value Date   TRIG 123 10/20/2015   Lab Results  Component Value Date   CHOLHDL 4.9 10/20/2015   No results found for: LDLDIRECT    Radiology: Dg Chest Portable 1 View  Result Date: 05/30/2019 CLINICAL DATA:  Sudden onset of chest pain. Shortness of breath. EXAM: PORTABLE CHEST 1 VIEW COMPARISON:  03/01/2019 FINDINGS: Post median sternotomy and CABG. The cardiomediastinal contours are unchanged with borderline cardiomegaly. Streaky right infrahilar opacity. Pulmonary vasculature is normal. No consolidation, pleural effusion, or pneumothorax. No acute osseous abnormalities are seen. IMPRESSION: Streaky right  infrahilar opacity, favor  atelectasis over pneumonia. Stable borderline cardiomegaly post CABG. Electronically Signed   By: Keith Rake M.D.   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  Chest pain, atypical in nature, nondiagnostic ECG, with normal high-sensitivity troponin x4, patient currently chest pain-free, ambulating without difficulty, wishes to go home 2.  Complex coronary artery disease as described above, with recent DES SVG to OM 2 with subtotaled SVG to RCA 03/08/2019  Recommendations  1.  Agree with current therapy 2.  Continue dual antiplatelet therapy uninterrupted for 1 year 3.  Continue aggressive risk factor modification 4.  Defer further cardiac diagnostics at this time 5.  Discharge home 6.  Follow-up with Dr. Nehemiah Massed in 1 week   Signed: Isaias Cowman MD,PhD, [facility] 05/31/2019, 9:27 AM

## 2019-05-31 NOTE — Discharge Summary (Signed)
[facility] [place] NAME: Felicia Woods    MR#:  [id number]  DATE OF BIRTH:  [birth year]  DATE OF ADMISSION:  05/30/2019 ADMITTING PHYSICIAN: Toy Baker, MD  DATE OF DISCHARGE: 05/31/2019  PRIMARY CARE PHYSICIAN: Lavera Guise, MD    ADMISSION DIAGNOSIS:  S/P CABG (coronary artery bypass graft) [Z95.1] Nonspecific chest pain [R07.9]  DISCHARGE DIAGNOSIS:  Chest Pain-atypical ruled out ACS/MI  SECONDARY DIAGNOSIS:   Past Medical History:  Diagnosis Date  . Asthma   . Coronary artery disease   . Diabetes mellitus without complication ([place])   . Hyperlipidemia   . Hypertension   . MI (myocardial infarction) ([place])   . Migraine headache with aura   . 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    HOSPITAL COURSE:   Felicia Woods is a 63 y.o. female with medical history significant of CAD, HTN, DM2 HLD, asthma who presented with  Chest pain started yday.She woke up this AM felt well but at 11 AM she had sharp pain in her chest.   . Chest pain-reports that this is similar to her prior angina.  -Appears atypical. Ruled out MI. -No acute EKG changes. Troponin x4 negative -cont ASA,plavix, statins Given recent history of cardiac stent placement continue Plavix Consult cardiology with Dr Josefa Half appreciated. Ok to go home from cardiac standpoint and f/u Dr Cammie Sickle.  -pt denies any CP this am  . Asthma -chronic currently stable  . Benign essential HTN  -stable continue home BP meds   . CAD (coronary artery disease)  -cont cardiac meds along with asa,plavix and statins  . Hyperlipidemia  -om simvastatin  . Morbid obesity ([place]) -discussed lifestyle and exercise  . Type 2 diabetes mellitus  With CAD, without long-term current use of insulin (HCC)  -resume home po meds   Overall stable. Pt is agreeable and requesting to go home. RN to ambulated prior to discharge CONSULTS OBTAINED:  Treatment Team:  Isaias Cowman, MD Teodoro Spray, MD  DRUG ALLERGIES:   Allergies  Allergen Reactions  . Penicillin G Anaphylaxis and Other (See Comments)    Has patient had a PCN reaction causing immediate rash, facial/tongue/throat swelling, SOB or lightheadedness with hypotension: [id number]:[id number]} Has patient had a PCN reaction causing severe rash involving mucus membranes or skin necrosis: no:[id number]} Has patient had a PCN reaction that required hospitalization no:[id number]} Has patient had a PCN reaction occurring within the last 10 years: no:[id number]} If all of the above answers are "NO", then may proceed with Cephalosporin use.    DISCHARGE MEDICATIONS:   Allergies as of 05/31/2019      Reactions   Penicillin G Anaphylaxis, Other (See Comments)   Has patient had a PCN reaction causing immediate rash, facial/tongue/throat swelling, SOB or lightheadedness with hypotension: [id number]:[id number]} Has patient had a PCN reaction causing severe rash involving mucus membranes or skin necrosis: no:[id number]} Has patient had a PCN reaction that required hospitalization no:[id number]} Has patient had a PCN reaction occurring within the last 10 years: no:[id number]} If all of the above answers are "NO", then may proceed with Cephalosporin use.      Medication List    STOP taking these medications   atorvastatin 20 MG tablet Commonly known as: LIPITOR     TAKE these medications   Accu-Chek Softclix Lancets lancets Use as instructed once  DAILY DIAG -E11.9   albuterol 108 (90 Base) MCG/ACT inhaler Commonly known as: VENTOLIN HFA Inhale 2 puffs into the lungs  every 6 (six) hours as needed for wheezing or shortness of breath.   amLODipine 10 MG tablet Commonly known as: NORVASC Take 1 tablet (10 mg total) by mouth daily.   aspirin EC 81 MG tablet Take 81 mg by mouth daily.   bisoprolol-hydrochlorothiazide 5-6.25 MG tablet Commonly known as: ZIAC Take 1 tablet by mouth daily.   clopidogrel 75 MG  tablet Commonly known as: PLAVIX Take 1 tablet (75 mg total) by mouth daily.   furosemide 40 MG tablet Commonly known as: LASIX Take 1 tablet (40 mg total) by mouth 2 (two) times daily.   glimepiride 1 MG tablet Commonly known as: AMARYL Take 1 tablet (1 mg total) by mouth daily with breakfast.   glucose blood test strip Commonly known as: Accu-Chek Aviva Plus 1 each by Other route as needed for other. Use as instructed once  DAILY DIAG -E11.9   isosorbide mononitrate 30 MG 24 hr tablet Commonly known as: IMDUR Take 1 tablet (30 mg total) by mouth daily.   losartan 100 MG tablet Commonly known as: COZAAR Take one tab po qd What changed:   how much to take  how to take this  when to take this   montelukast 10 MG tablet Commonly known as: SINGULAIR Take 1 tablet (10 mg total) by mouth daily. What changed: when to take this   nitroGLYCERIN 0.4 MG SL tablet Commonly known as: NITROSTAT Place 0.4 mg under the tongue every 5 (five) minutes x 3 doses as needed for chest pain. *If no relief call MD or go to Emergency Room*   oxybutynin 5 MG tablet Commonly known as: DITROPAN Take 1 tablet (5 mg total) by mouth daily after supper.   sertraline 100 MG tablet Commonly known as: ZOLOFT Take 1 tablet (100 mg total) by mouth daily. What changed: when to take this   simvastatin 40 MG tablet Commonly known as: ZOCOR Take 40 mg by mouth daily.   Trulicity 1.5 ZP/9.1TA Sopn Generic drug: Dulaglutide Inject 1.5 mg into the skin once a week. What changed: when to take this   zolpidem 10 MG tablet Commonly known as: AMBIEN Take 1 tablet (10 mg total) by mouth at bedtime.       If you experience worsening of your admission symptoms, develop shortness of breath, life threatening emergency, suicidal or homicidal thoughts you must seek medical attention immediately by calling 911 or calling your MD immediately  if symptoms less severe.  You Must read complete  instructions/literature along with all the possible adverse reactions/side effects for all the Medicines you take and that have been prescribed to you. Take any new Medicines after you have completely understood and accept all the possible adverse reactions/side effects.   Please note  You were cared for by a hospitalist during your hospital stay. If you have any questions about your discharge medications or the care you received while you were in the hospital after you are discharged, you can call the unit and asked to speak with the hospitalist on call if the hospitalist that took care of you is not available. Once you are discharged, your primary care physician will handle any further medical issues. Please note that NO REFILLS for any discharge medications will be authorized once you are discharged, as it is imperative that you return to your primary care physician (or establish a relationship with a primary care physician if you do not have one) for your aftercare needs so that they can reassess your need for  medications and monitor your lab values. Today   SUBJECTIVE   No new complaints. Denies sob or cp  VITAL SIGNS:  Blood pressure (!) 125/93, pulse 62, temperature 97.7 F (36.5 C), temperature source Oral, resp. rate 16, height _0  (1.702 m), weight 115.3 kg, SpO2 96 %.  I/O:    Intake/Output Summary (Last 24 hours) at 05/31/2019 0949 Last data filed at 05/31/2019 0504 Gross per 24 hour  Intake -  Output 0 ml  Net 0 ml    PHYSICAL EXAMINATION:  GENERAL:  63 y.o.-year-old patient lying in the bed with no acute distress. Obese EYES: Pupils equal, round, reactive to light and accommodation. No scleral icterus. Extraocular muscles intact.  HEENT: Head atraumatic, normocephalic. Oropharynx and nasopharynx clear.  NECK:  Supple, no jugular venous distention. No thyroid enlargement, no tenderness.  LUNGS: Normal breath sounds bilaterally, no wheezing, rales,rhonchi or crepitation.  No use of accessory muscles of respiration.  CARDIOVASCULAR: S1, S2 normal. No murmurs, rubs, or gallops.  ABDOMEN: Soft, non-tender, non-distended. Bowel sounds present. No organomegaly or mass.  EXTREMITIES: No pedal edema, cyanosis, or clubbing.  NEUROLOGIC: Cranial nerves II through XII are intact. Muscle strength 5/5 in all extremities. Sensation intact. Gait not checked.  PSYCHIATRIC: patient is alert and oriented x 3.  SKIN: No obvious rash, lesion, or ulcer.   DATA REVIEW:   CBC  Recent Labs  Lab 05/31/19 0156  WBC 7.5  HGB 11.3*  HCT 36.0  PLT 217    Chemistries  Recent Labs  Lab 05/31/19 0156  NA 140  K 3.1*  CL 105  CO2 25  GLUCOSE 210*  BUN 13  CREATININE 0.69  CALCIUM 8.8*  MG 1.8  AST 20  ALT 12  ALKPHOS 54  BILITOT 0.5    Microbiology Results   Recent Results (from the past 240 hour(s))  SARS CORONAVIRUS 2 (TAT 6-24 HRS) Nasopharyngeal Nasopharyngeal Swab     Status: None   Collection Time: 05/30/19  9:36 PM   Specimen: Nasopharyngeal Swab  Result Value Ref Range Status   SARS Coronavirus 2 NEGATIVE NEGATIVE Final    Comment: (NOTE) SARS-CoV-2 target nucleic acids are NOT DETECTED. The SARS-CoV-2 RNA is generally detectable in upper and lower respiratory specimens during the acute phase of infection. Negative results do not preclude SARS-CoV-2 infection, do not rule out co-infections with other pathogens, and should not be used as the sole basis for treatment or other patient management decisions. Negative results must be combined with clinical observations, patient history, and epidemiological information. The expected result is Negative. Fact Sheet for Patients: SugarRoll.be Fact Sheet for Healthcare Providers: https://www.woods-mathews.com/ This test is not yet approved or cleared by the [place] FDA and  has been authorized for detection and/or diagnosis of SARS-CoV-2 by FDA under an  Emergency Use Authorization (EUA). This EUA will remain  in effect (meaning this test can be used) for the duration of the COVID-19 declaration under Section 56 4(b)(1) of the Act, 21 U.S.C. section 360bbb-3(b)(1), unless the authorization is terminated or revoked sooner. Performed at [facility], [place] [address] [place], [place] [address]     RADIOLOGY:  Dg Chest Portable 1 View  Result Date: 05/30/2019 CLINICAL DATA:  Sudden onset of chest pain. Shortness of breath. EXAM: PORTABLE CHEST 1 VIEW COMPARISON:  03/01/2019 FINDINGS: Post median sternotomy and CABG. The cardiomediastinal contours are unchanged with borderline cardiomegaly. Streaky right infrahilar opacity. Pulmonary vasculature is normal. No consolidation, pleural effusion, or pneumothorax. No acute  osseous abnormalities are seen. IMPRESSION: Streaky right infrahilar opacity, favor atelectasis over pneumonia. Stable borderline cardiomegaly post CABG. Electronically Signed   By: Keith Rake M.D.   On: 05/30/2019 20:13     CODE STATUS:     Code Status Orders  (From admission, onward)         Start     Ordered   05/30/19 2337  Full code  Continuous     05/30/19 2337        Code Status History    Date Active Date Inactive Code Status Order ID Comments User Context   03/08/2019 1602 03/09/2019 1854 Full Code [id number]  Yolonda Kida, MD Inpatient   03/08/2019 1532 03/08/2019 1602 Full Code [id number]  Yolonda Kida, MD Inpatient   02/13/2019 1927 02/14/2019 1839 Full Code [id number]  Nelva Bush, MD Inpatient   12/14/2017 0143 12/15/2017 1644 Full Code [id number]  Amelia Jo, MD ED   11/23/2017 1254 11/23/2017 1801 Full Code [id number]  Corey Skains, MD Inpatient   11/02/2017 0216 11/03/2017 1818 Full Code [id number]  Arta Silence, MD Inpatient   02/16/2017 1024 02/17/2017 1245 Full Code [id number]  Yolonda Kida, MD Inpatient   Advance Care Planning Activity    Advance Directive Documentation      Most Recent Value  Type of Advance Directive  Healthcare Power of Attorney, Living will  Pre-existing out of facility DNR order (yellow form or pink MOST form)  -  "MOST" Form in Place?  -     Family Discussion: Consults:   TOTAL TIME TAKING CARE OF THIS PATIENT: 40 minutes.    Fritzi Mandes M.D on 05/31/2019 at 9:49 AM  Between 7am to 6pm - Pager - [phone] After 6pm go to [email or web address] - password [email or web address]  [facility]    CC: Primary care physician; Lavera Guise, MD

## 2019-06-04 LAB — PANEL 083904
HIV 1 AB: NEGATIVE
HIV 2 AB: NEGATIVE
Note: NEGATIVE

## 2019-06-07 DIAGNOSIS — I5032 Chronic diastolic (congestive) heart failure: Secondary | ICD-10-CM | POA: Diagnosis not present

## 2019-06-07 DIAGNOSIS — I25708 Atherosclerosis of coronary artery bypass graft(s), unspecified, with other forms of angina pectoris: Secondary | ICD-10-CM | POA: Diagnosis not present

## 2019-06-07 DIAGNOSIS — G8929 Other chronic pain: Secondary | ICD-10-CM | POA: Diagnosis not present

## 2019-06-07 DIAGNOSIS — R079 Chest pain, unspecified: Secondary | ICD-10-CM | POA: Diagnosis not present

## 2019-06-08 ENCOUNTER — Telehealth: Payer: Self-pay

## 2019-06-08 NOTE — Telephone Encounter (Signed)
CONFIRMED AND SCREENED FOR 06-12-19 OV.

## 2019-06-11 DIAGNOSIS — Z1231 Encounter for screening mammogram for malignant neoplasm of breast: Secondary | ICD-10-CM | POA: Diagnosis not present

## 2019-06-12 ENCOUNTER — Ambulatory Visit: Payer: Medicare Other | Admitting: Internal Medicine

## 2019-06-14 ENCOUNTER — Telehealth: Payer: Self-pay

## 2019-06-14 NOTE — Telephone Encounter (Signed)
Confirmed appointment with patient. klh °

## 2019-06-15 ENCOUNTER — Other Ambulatory Visit: Payer: Self-pay | Admitting: Internal Medicine

## 2019-06-15 ENCOUNTER — Telehealth: Payer: Self-pay

## 2019-06-15 DIAGNOSIS — I25708 Atherosclerosis of coronary artery bypass graft(s), unspecified, with other forms of angina pectoris: Secondary | ICD-10-CM

## 2019-06-15 NOTE — Telephone Encounter (Signed)
BILLED MISSED APPT FEE 06/12/2019

## 2019-06-18 ENCOUNTER — Ambulatory Visit: Payer: Self-pay | Admitting: Adult Health

## 2019-06-18 ENCOUNTER — Other Ambulatory Visit: Payer: Self-pay

## 2019-06-18 MED ORDER — ACCU-CHEK AVIVA PLUS VI STRP: 1.0000 | ORAL_STRIP | 3 refills | Status: DC | PRN

## 2019-06-21 ENCOUNTER — Other Ambulatory Visit: Payer: Self-pay

## 2019-06-21 ENCOUNTER — Encounter
Admission: RE | Admit: 2019-06-21 | Discharge: 2019-06-21 | Disposition: A | Discharge status: Home / Self Care | Payer: Medicare Other | Source: Ambulatory Visit | Attending: Internal Medicine | Admitting: Internal Medicine

## 2019-06-21 DIAGNOSIS — I25708 Atherosclerosis of coronary artery bypass graft(s), unspecified, with other forms of angina pectoris: Secondary | ICD-10-CM | POA: Diagnosis not present

## 2019-06-21 DIAGNOSIS — I200 Unstable angina: Secondary | ICD-10-CM | POA: Diagnosis not present

## 2019-06-21 LAB — NM MYOCAR MULTI W/SPECT W/WALL MOTION / EF
LV dias vol: 76 mL (ref 46–106)
LV sys vol: 34 mL
SDS: 7
SRS: 1
SSS: 8
TID: 1.08

## 2019-06-21 MED ORDER — TECHNETIUM TC 99M TETROFOSMIN IV KIT
30.7500 | PACK | Freq: Once | INTRAVENOUS | Status: AC | PRN
  Administered 2019-06-21: 30.75 via INTRAVENOUS

## 2019-06-21 MED ORDER — REGADENOSON 0.4 MG/5ML IV SOLN
0.4000 mg | Freq: Once | INTRAVENOUS | Status: AC
  Administered 2019-06-21: 0.4 mg via INTRAVENOUS
  Filled 2019-06-21: qty 5

## 2019-06-21 MED ORDER — TECHNETIUM TC 99M TETROFOSMIN IV KIT
10.0000 | PACK | Freq: Once | INTRAVENOUS | Status: AC | PRN
  Administered 2019-06-21: 10.81 via INTRAVENOUS

## 2019-07-02 ENCOUNTER — Other Ambulatory Visit: Payer: Self-pay

## 2019-07-02 ENCOUNTER — Emergency Department: Payer: Medicare Other

## 2019-07-02 ENCOUNTER — Encounter: Payer: Self-pay | Admitting: Emergency Medicine

## 2019-07-02 ENCOUNTER — Inpatient Hospital Stay
Admission: EM | Admit: 2019-07-02 | Discharge: 2019-07-04 | DRG: 281 | Disposition: A | Discharge status: Home / Self Care | Payer: Medicare Other | Attending: Internal Medicine | Admitting: Internal Medicine

## 2019-07-02 DIAGNOSIS — Z880 Allergy status to penicillin: Secondary | ICD-10-CM | POA: Diagnosis not present

## 2019-07-02 DIAGNOSIS — I252 Old myocardial infarction: Secondary | ICD-10-CM | POA: Diagnosis not present

## 2019-07-02 DIAGNOSIS — I110 Hypertensive heart disease with heart failure: Secondary | ICD-10-CM | POA: Diagnosis present

## 2019-07-02 DIAGNOSIS — I2510 Atherosclerotic heart disease of native coronary artery without angina pectoris: Secondary | ICD-10-CM | POA: Diagnosis present

## 2019-07-02 DIAGNOSIS — Z7902 Long term (current) use of antithrombotics/antiplatelets: Secondary | ICD-10-CM

## 2019-07-02 DIAGNOSIS — Z955 Presence of coronary angioplasty implant and graft: Secondary | ICD-10-CM

## 2019-07-02 DIAGNOSIS — E1159 Type 2 diabetes mellitus with other circulatory complications: Secondary | ICD-10-CM | POA: Diagnosis not present

## 2019-07-02 DIAGNOSIS — J45909 Unspecified asthma, uncomplicated: Secondary | ICD-10-CM | POA: Diagnosis present

## 2019-07-02 DIAGNOSIS — I160 Hypertensive urgency: Secondary | ICD-10-CM | POA: Diagnosis present

## 2019-07-02 DIAGNOSIS — E1165 Type 2 diabetes mellitus with hyperglycemia: Secondary | ICD-10-CM | POA: Diagnosis present

## 2019-07-02 DIAGNOSIS — I214 Non-ST elevation (NSTEMI) myocardial infarction: Secondary | ICD-10-CM | POA: Diagnosis not present

## 2019-07-02 DIAGNOSIS — Z20828 Contact with and (suspected) exposure to other viral communicable diseases: Secondary | ICD-10-CM | POA: Diagnosis present

## 2019-07-02 DIAGNOSIS — Z79899 Other long term (current) drug therapy: Secondary | ICD-10-CM

## 2019-07-02 DIAGNOSIS — Z951 Presence of aortocoronary bypass graft: Secondary | ICD-10-CM

## 2019-07-02 DIAGNOSIS — E785 Hyperlipidemia, unspecified: Secondary | ICD-10-CM | POA: Diagnosis present

## 2019-07-02 DIAGNOSIS — Z833 Family history of diabetes mellitus: Secondary | ICD-10-CM

## 2019-07-02 DIAGNOSIS — I4510 Unspecified right bundle-branch block: Secondary | ICD-10-CM | POA: Diagnosis present

## 2019-07-02 DIAGNOSIS — R079 Chest pain, unspecified: Secondary | ICD-10-CM

## 2019-07-02 DIAGNOSIS — I5032 Chronic diastolic (congestive) heart failure: Secondary | ICD-10-CM | POA: Diagnosis present

## 2019-07-02 DIAGNOSIS — I5033 Acute on chronic diastolic (congestive) heart failure: Secondary | ICD-10-CM

## 2019-07-02 DIAGNOSIS — Z7982 Long term (current) use of aspirin: Secondary | ICD-10-CM | POA: Diagnosis not present

## 2019-07-02 DIAGNOSIS — I10 Essential (primary) hypertension: Secondary | ICD-10-CM | POA: Diagnosis not present

## 2019-07-02 DIAGNOSIS — E119 Type 2 diabetes mellitus without complications: Secondary | ICD-10-CM | POA: Diagnosis present

## 2019-07-02 LAB — BASIC METABOLIC PANEL
Anion gap: 13 (ref 5–15)
BUN: 11 mg/dL (ref 8–23)
CO2: 23 mmol/L (ref 22–32)
Calcium: 9.4 mg/dL (ref 8.9–10.3)
Chloride: 100 mmol/L (ref 98–111)
Creatinine, Ser: 0.71 mg/dL (ref 0.44–1.00)
GFR calc Af Amer: >60 mL/min (ref >60)
GFR calc non Af Amer: >60 mL/min (ref >60)
Glucose, Bld: 185 mg/dL — ABNORMAL HIGH (ref 70–99)
Potassium: 3.6 mmol/L (ref 3.5–5.1)
Sodium: 136 mmol/L (ref 135–145)

## 2019-07-02 LAB — CBC
HCT: 39.1 % (ref 36.0–46.0)
Hemoglobin: 13.1 g/dL (ref 12.0–15.0)
MCH: 25.3 pg — ABNORMAL LOW (ref 26.0–34.0)
MCHC: 33.5 g/dL (ref 30.0–36.0)
MCV: 75.5 fL — ABNORMAL LOW (ref 80.0–100.0)
Platelets: 217 10*3/uL (ref 150–400)
RBC: 5.18 MIL/uL — ABNORMAL HIGH (ref 3.87–5.11)
RDW: 16.7 % — ABNORMAL HIGH (ref 11.5–15.5)
WBC: 10.1 10*3/uL (ref 4.0–10.5)
nRBC: 0 % (ref 0.0–0.2)

## 2019-07-02 LAB — RESPIRATORY PANEL BY RT PCR (FLU A&B, COVID)
Influenza A by PCR: NEGATIVE
Influenza B by PCR: NEGATIVE
SARS Coronavirus 2 by RT PCR: NEGATIVE

## 2019-07-02 LAB — TROPONIN I (HIGH SENSITIVITY): Troponin I (High Sensitivity): 750 ng/L (ref <18)

## 2019-07-02 LAB — BRAIN NATRIURETIC PEPTIDE: B Natriuretic Peptide: 126 pg/mL — ABNORMAL HIGH (ref 0.0–100.0)

## 2019-07-02 MED ORDER — RANOLAZINE ER 500 MG PO TB12
500.0000 mg | ORAL_TABLET | Freq: Two times a day (BID) | ORAL | Status: DC
  Administered 2019-07-03: 500 mg via ORAL
  Filled 2019-07-02 (×3): qty 1

## 2019-07-02 MED ORDER — ISOSORBIDE MONONITRATE ER 30 MG PO TB24
30.0000 mg | ORAL_TABLET | Freq: Every day | ORAL | Status: DC
  Administered 2019-07-03 – 2019-07-04 (×2): 30 mg via ORAL
  Filled 2019-07-02 (×2): qty 1

## 2019-07-02 MED ORDER — BISOPROLOL-HYDROCHLOROTHIAZIDE 5-6.25 MG PO TABS
1.0000 | ORAL_TABLET | Freq: Every day | ORAL | Status: DC
  Filled 2019-07-02: qty 1

## 2019-07-02 MED ORDER — ATORVASTATIN CALCIUM 80 MG PO TABS
80.0000 mg | ORAL_TABLET | Freq: Every day | ORAL | Status: DC
  Administered 2019-07-03: 80 mg via ORAL
  Filled 2019-07-02: qty 4
  Filled 2019-07-02: qty 1

## 2019-07-02 MED ORDER — ONDANSETRON HCL 4 MG/2ML IJ SOLN
4.0000 mg | Freq: Four times a day (QID) | INTRAMUSCULAR | Status: DC | PRN
  Administered 2019-07-03: 4 mg via INTRAVENOUS

## 2019-07-02 MED ORDER — ASPIRIN 81 MG PO CHEW
324.0000 mg | CHEWABLE_TABLET | Freq: Once | ORAL | Status: AC
  Administered 2019-07-02: 324 mg via ORAL
  Filled 2019-07-02: qty 4

## 2019-07-02 MED ORDER — NITROGLYCERIN 0.4 MG SL SUBL: 0.4000 mg | SUBLINGUAL_TABLET | SUBLINGUAL | Status: DC | PRN

## 2019-07-02 MED ORDER — LOSARTAN POTASSIUM 50 MG PO TABS
100.0000 mg | ORAL_TABLET | Freq: Every day | ORAL | Status: DC
  Administered 2019-07-03 – 2019-07-04 (×2): 100 mg via ORAL
  Filled 2019-07-02 (×2): qty 2

## 2019-07-02 MED ORDER — FENTANYL CITRATE (PF) 100 MCG/2ML IJ SOLN
50.0000 ug | Freq: Once | INTRAMUSCULAR | Status: AC
  Administered 2019-07-02: 50 ug via INTRAVENOUS
  Filled 2019-07-02: qty 2

## 2019-07-02 MED ORDER — AMLODIPINE BESYLATE 10 MG PO TABS
10.0000 mg | ORAL_TABLET | Freq: Every day | ORAL | Status: DC
  Administered 2019-07-03 – 2019-07-04 (×2): 10 mg via ORAL
  Filled 2019-07-02: qty 1
  Filled 2019-07-02: qty 2

## 2019-07-02 MED ORDER — HEPARIN (PORCINE) 25000 UT/250ML-% IV SOLN
1200.0000 [IU]/h | INTRAVENOUS | Status: DC
  Administered 2019-07-02: 1200 [IU]/h via INTRAVENOUS
  Filled 2019-07-02: qty 250

## 2019-07-02 MED ORDER — FUROSEMIDE 40 MG PO TABS
40.0000 mg | ORAL_TABLET | Freq: Two times a day (BID) | ORAL | Status: DC
  Administered 2019-07-03 – 2019-07-04 (×3): 40 mg via ORAL
  Filled 2019-07-02 (×3): qty 1

## 2019-07-02 MED ORDER — ACETAMINOPHEN 325 MG PO TABS
650.0000 mg | ORAL_TABLET | ORAL | Status: DC | PRN
  Administered 2019-07-03: 650 mg via ORAL
  Filled 2019-07-02: qty 2

## 2019-07-02 MED ORDER — NITROGLYCERIN 0.4 MG SL SUBL
0.4000 mg | SUBLINGUAL_TABLET | Freq: Once | SUBLINGUAL | Status: AC
  Administered 2019-07-02: 0.4 mg via SUBLINGUAL
  Filled 2019-07-02: qty 1

## 2019-07-02 MED ORDER — HEPARIN BOLUS VIA INFUSION
4000.0000 [IU] | Freq: Once | INTRAVENOUS | Status: AC
  Administered 2019-07-02: 4000 [IU] via INTRAVENOUS
  Filled 2019-07-02: qty 4000

## 2019-07-02 MED ORDER — ASPIRIN EC 81 MG PO TBEC
81.0000 mg | DELAYED_RELEASE_TABLET | Freq: Every day | ORAL | Status: DC
  Administered 2019-07-03 – 2019-07-04 (×2): 81 mg via ORAL
  Filled 2019-07-02 (×2): qty 1

## 2019-07-02 MED ORDER — INSULIN ASPART 100 UNIT/ML ~~LOC~~ SOLN
0.0000 [IU] | Freq: Three times a day (TID) | SUBCUTANEOUS | Status: DC
  Administered 2019-07-03 (×2): 5 [IU] via SUBCUTANEOUS
  Administered 2019-07-04: 2 [IU] via SUBCUTANEOUS
  Administered 2019-07-04: 5 [IU] via SUBCUTANEOUS
  Filled 2019-07-02 (×4): qty 1

## 2019-07-02 MED ORDER — SIMVASTATIN 10 MG PO TABS: 40.0000 mg | ORAL_TABLET | Freq: Every day | ORAL | Status: DC

## 2019-07-02 MED ORDER — CLOPIDOGREL BISULFATE 75 MG PO TABS
75.0000 mg | ORAL_TABLET | Freq: Every day | ORAL | Status: DC
  Administered 2019-07-03 – 2019-07-04 (×2): 75 mg via ORAL
  Filled 2019-07-02 (×2): qty 1

## 2019-07-02 NOTE — ED Triage Notes (Signed)
Patient presents to the ED with severe left sided chest pain radiating down her left arm that began around 8am while patient was lying in bed, watching television.  Patient reports history of 3 heart attacks and states this feels the same.  Patient reports shortness of breath as well.  Patient reports taking 1 sl nitro and 1 81mg  aspirin prior to arrival.  Patient states nitro did not help improve pain.  Patient appears uncomfortable in triage.

## 2019-07-02 NOTE — ED Notes (Signed)
Per lab blue top tube hemolyzed that was drawn from IV, lab to come and draw repeat troponin and blue top tube

## 2019-07-02 NOTE — ED Provider Notes (Signed)
[facility] Emergency Department Provider Note  ____________________________________________   First MD Initiated Contact with Patient 07/02/19 2141     (approximate)  I have reviewed the triage vital signs and the nursing notes.  History  Chief Complaint Chest Pain    HPI Felicia Woods is a 62 y.o. female with history of CAD, s/p CABG and PCI, DM, HTN, HLD who presents to the emergency department for left-sided chest pain.  Patient states she was at rest, watching TV when she developed sudden onset of central chest pain at 8 PM. Describes it as pressure, sharpness. 10/10 in severity. Has some radiation to her L arm, and states her L arm feels numb/tingly. Associated with SOB, nausea. Feels similar to prior heart attacks. Pain is constant, no alleviating/aggravating components. Tried nitro prior to arrival w/ no improvement in symptoms.   On chart review, patient did recently just have an NM scan on 12/17 which was reportedly normal.    Past Medical Hx Past Medical History:  Diagnosis Date  . Asthma   . Coronary artery disease   . Diabetes mellitus without complication ([place])   . Hyperlipidemia   . Hypertension   . MI (myocardial infarction) ([place])   . Migraine headache with aura   . 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    Problem List Patient Active Problem List   Diagnosis Date Noted  . Unstable angina ([place]) 02/13/2019  . Acute bilateral low back pain without sciatica 08/21/2018  . Asthma 08/21/2018  . Coronary artery disease involving coronary bypass graft of native heart without angina pectoris 08/21/2018  . Depression, major, single episode, in partial remission ([place]) 08/21/2018  . Chest pain 11/02/2017  . S/P drug eluting coronary stent placement 02/16/2017  . Acute on chronic diastolic CHF (congestive heart failure), NYHA class 3 ([place]) 12/07/2016  . Numbness and tingling 12/01/2016  . Chronic tension-type headache, intractable 12/01/2016    . Osteoarthritis of knee 11/12/2016  . Obesity, Class II, BMI 35-39.9 05/13/2016  . Hx of CABG 2010 11/12/2015  . Type 2 diabetes mellitus with circulatory disorder, without long-term current use of insulin ([place]) 11/12/2015  . Morbid obesity ([place]) 11/12/2015  . SOBOE (shortness of breath on exertion) 11/12/2015  . RBBB 11/12/2015  . ST elevation myocardial infarction (STEMI) of inferior wall ([place]) 11/03/2015  . SVG-RCA throbectomy and DES 10/18/15   . Hypertensive heart disease without heart failure   . Vertigo   . Hyperlipidemia   . STEMI 10/18/15 10/18/2015  . Intractable chronic migraine without aura and without status migrainosus 05/08/2015  . Benign essential HTN 03/17/2015  . CAD (coronary artery disease) 08/26/2014  . Headache 08/06/2014    Past Surgical Hx Past Surgical History:  Procedure Laterality Date  . ABDOMINAL HYSTERECTOMY    . CARDIAC CATHETERIZATION    . CARDIAC CATHETERIZATION N/A 10/18/2015   Procedure: Left Heart Cath and Cors/Grafts Angiography;  Surgeon: Lorretta Harp, MD;  Location: [facility];  Service: Cardiovascular;  Laterality: N/A;  . CARDIAC CATHETERIZATION N/A 10/18/2015   Procedure: Coronary Stent Intervention;  Surgeon: Lorretta Harp, MD;  Location: [facility];  Service: Cardiovascular;  Laterality: N/A;  . CARDIAC SURGERY    . CHOLECYSTECTOMY    . CORONARY ANGIOPLASTY    . CORONARY ARTERY BYPASS GRAFT     4 vessels - 2010  . CORONARY STENT INTERVENTION N/A 02/16/2017   Procedure: CORONARY STENT INTERVENTION;  Surgeon: Yolonda Kida, MD;  Location: [facility]  INVASIVE [facility] LAB;  Service: Cardiovascular;  Laterality: N/A;  . CORONARY STENT INTERVENTION N/A 02/13/2019   Procedure: CORONARY STENT INTERVENTION;  Surgeon: Nelva Bush, MD;  Location: [facility];  Service: Cardiovascular;  Laterality: N/A;  SVG to RCA  . CORONARY STENT INTERVENTION Left 03/08/2019   Procedure: CORONARY STENT INTERVENTION;  Surgeon: Yolonda Kida, MD;  Location: [facility];  Service: Cardiovascular;  Laterality: Left;  . LEFT HEART CATH AND CORONARY ANGIOGRAPHY Left 02/16/2017   Procedure: LEFT HEART CATH AND CORONARY ANGIOGRAPHY;  Surgeon: Corey Skains, MD;  Location: [facility];  Service: Cardiovascular;  Laterality: Left;  . LEFT HEART CATH AND CORS/GRAFTS ANGIOGRAPHY Left 11/23/2017   Procedure: LEFT HEART CATH AND CORS/GRAFTS ANGIOGRAPHY;  Surgeon: Corey Skains, MD;  Location: [facility];  Service: Cardiovascular;  Laterality: Left;  . LEFT HEART CATH AND CORS/GRAFTS ANGIOGRAPHY N/A 02/13/2019   Procedure: LEFT HEART CATH AND CORS/GRAFTS ANGIOGRAPHY;  Surgeon: Corey Skains, MD;  Location: [facility];  Service: Cardiovascular;  Laterality: N/A;    Medications Prior to Admission medications   Medication Sig Start Date End Date Taking? Authorizing Provider  ACCU-CHEK SOFTCLIX LANCETS lancets Use as instructed once  DAILY DIAG -E11.9 01/16/18   Lavera Guise, MD  albuterol (PROVENTIL HFA;VENTOLIN HFA) 108 (90 Base) MCG/ACT inhaler Inhale 2 puffs into the lungs every 6 (six) hours as needed for wheezing or shortness of breath.    [provider]  amLODipine (NORVASC) 10 MG tablet Take 1 tablet (10 mg total) by mouth daily. 03/29/19   Kendell Bane, NP  aspirin EC 81 MG tablet Take 81 mg by mouth daily.    [provider]  bisoprolol-hydrochlorothiazide (ZIAC) 5-6.25 MG tablet Take 1 tablet by mouth daily. 09/08/18   Kendell Bane, NP  clopidogrel (PLAVIX) 75 MG tablet Take 1 tablet (75 mg total) by mouth daily. 09/08/18   [place], Audie Clear, NP  Dulaglutide (TRULICITY) 1.5 ZO/1.0RU SOPN Inject 1.5 mg into the skin once a week. Patient taking differently: Inject 1.5 mg into the skin every Thursday.  01/18/19   Kendell Bane, NP  furosemide (LASIX) 40 MG tablet Take 1 tablet (40 mg total) by mouth 2 (two) times daily. 09/08/18   Kendell Bane, NP  glimepiride  (AMARYL) 1 MG tablet Take 1 tablet (1 mg total) by mouth daily with breakfast. 09/08/18   Kendell Bane, NP  glucose blood (ACCU-CHEK AVIVA PLUS) test strip 1 each by Other route as needed for other. Use as instructed once  DAILY DIAG -E11.9 06/18/19   Ronnell Freshwater, NP  isosorbide mononitrate (IMDUR) 30 MG 24 hr tablet Take 1 tablet (30 mg total) by mouth daily. 03/10/19   [place], Dwayne D, MD  losartan (COZAAR) 100 MG tablet Take one tab po qd Patient taking differently: Take 100 mg by mouth daily. Take one tab po qd 09/08/18   Kendell Bane, NP  montelukast (SINGULAIR) 10 MG tablet Take 1 tablet (10 mg total) by mouth daily. Patient taking differently: Take 10 mg by mouth at bedtime.  09/08/18   Kendell Bane, NP  nitroGLYCERIN (NITROSTAT) 0.4 MG SL tablet Place 0.4 mg under the tongue every 5 (five) minutes x 3 doses as needed for chest pain. *If no relief call MD or go to Emergency Room*    [provider]  oxybutynin (DITROPAN) 5 MG tablet Take 1 tablet (5 mg total) by  mouth daily after supper. 09/08/18   Kendell Bane, NP  sertraline (ZOLOFT) 100 MG tablet Take 1 tablet (100 mg total) by mouth daily. Patient taking differently: Take 100 mg by mouth at bedtime.  09/08/18   Kendell Bane, NP  simvastatin (ZOCOR) 40 MG tablet Take 40 mg by mouth daily.    [provider]  zolpidem (AMBIEN) 10 MG tablet Take 1 tablet (10 mg total) by mouth at bedtime. 09/08/18   Kendell Bane, NP    Allergies Penicillin g  Family Hx Family History  Problem Relation Age of Onset  . Diabetes Mother   . Diabetes Father   . Cancer Father   . Diabetes Brother     Social Hx Social History   Tobacco Use  . Smoking status: Never Smoker  . Smokeless tobacco: Never Used  Substance Use Topics  . Alcohol use: No    Alcohol/week: 0.0 standard drinks  . Drug use: No     Review of Systems  Constitutional: Negative for fever, chills. Eyes: Negative for visual changes. ENT:  Negative for sore throat. Cardiovascular: + for chest pain. Respiratory: + for shortness of breath. Gastrointestinal: + for nausea.  Genitourinary: Negative for dysuria. Musculoskeletal: Negative for leg swelling. Skin: Negative for rash. Neurological: Negative for for headaches.   Physical Exam  Vital Signs: ED Triage Vitals  Enc Vitals Group     BP 07/02/19 2032 (!) 198/104     Pulse Rate 07/02/19 2032 89     Resp 07/02/19 2032 20     Temp 07/02/19 2032 98.9 F (37.2 C)     Temp Source 07/02/19 2032 Oral     SpO2 07/02/19 2032 96 %     Weight 07/02/19 2033 235 lb (106.6 kg)     Height 07/02/19 2033 _0  (1.702 m)     Head Circumference --      Peak Flow --      Pain Score 07/02/19 2033 10     Pain Loc --      Pain Edu? --      Excl. in [place]? --     Constitutional: Alert and oriented. Appears uncomfortable.  Head: Normocephalic. Atraumatic. Eyes: Conjunctivae clear. Sclera anicteric. Nose: No congestion. No rhinorrhea. Mouth/Throat: Wearing mask.  Neck: No stridor.   Cardiovascular: Normal rate, regular rhythm. Extremities well perfused. Equal and symmetric distal pulses.  Respiratory: Normal respiratory effort.  Lungs CTAB. Gastrointestinal: Soft. Non-tender. Non-distended.  Musculoskeletal: No lower extremity edema. No deformities. Neurologic:  Normal speech and language. No gross focal neurologic deficits are appreciated.  Skin: Skin is warm, dry and intact. No rash noted. Psychiatric: Mood and affect are appropriate for situation.  EKG  Personally reviewed.   Rate: 96 Rhythm: sinus Axis: LAD Intervals: abnormal QRS 2/2 RBBB NSR, RBBB, TWI I, avL, V1-3, seen previously on EKG 11/25 at 19:54    Radiology  CXR: IMPRESSION:  Cardiomegaly without acute abnormality of the lungs.    Procedures  Procedure(s) performed (including critical care):  .Critical Care Performed by: Lilia Pro., MD Authorized by: Lilia Pro., MD   Critical care  provider statement:    Critical care time (minutes):  40   Critical care was necessary to treat or prevent imminent or life-threatening deterioration of the following conditions:  Cardiac failure   Critical care was time spent personally by me on the following activities:  Discussions with consultants, evaluation of patient's response to treatment, examination of patient, ordering and performing  treatments and interventions, ordering and review of laboratory studies, ordering and review of radiographic studies, pulse oximetry, re-evaluation of patient's condition, obtaining history from patient or surrogate and review of old charts     Initial Impression / Assessment and Plan / ED Course  63 y.o. female with hx of CAD, s/p CABG and PCI as above who presents to the ED for chest pain.  EKG reveals RBBB, TWI as above. Abnormal, but largely unchanged from prior.   HS troponin elevated at 750 (from normal previously), concerning for NSTEMI. Will give full dose ASA, initiate heparin drip, and plan for admission. Nitro/fentanyl for pain control. Discussed w/ cardiology who agree with plan. Will admit to hospitalist.    Final Clinical Impression(s) / ED Diagnosis  Final diagnoses:  Chest pain in adult  NSTEMI (non-ST elevated myocardial infarction) [facility])       Note:  This document was prepared using Dragon voice recognition software and may include unintentional dictation errors.   Lilia Pro., MD 07/02/19 [phone]

## 2019-07-02 NOTE — H&P (Signed)
History and Physical    Felicia Woods JIR:[id number] DOB: 1956-02-26 DOA: 07/02/2019  PCP: Lavera Guise, MD  Patient coming from: home I have personally briefly reviewed patient's old medical records in [place]  Chief Complaint: chest pain  HPI: Felicia Woods is a 63 y.o. female with medical history significant for coronary artery disease, status post CABG, diabetes and hypertension, hospitalized in November 2020 with chest pain and discharged with a diagnosis of atypical chest pain after having a negative stress test who returns to the emergency room with left-sided chest pain radiating to the left arm that started at 8 AM this morning while she was lying in bed.  She took an aspirin and sublingual nitroglycerin with inadequate relief and was brought to the emergency room.   ED Course: On arrival in the ER she was afebrile blood pressure was 198/104 heart rate 89 oxygen saturation 98% on room air.  EKG showed normal sinus rhythm but no acute ST-T wave changes.  Troponin returned at 750.  Other blood work was for the most part unremarkable.  Chest x-ray showed no acute findings.  Patient was administered chewable aspirin 325, nitroglycerin and started on a heparin infusion after consultation with cardiologist Dr. Nehemiah Massed.  [facility] service consulted for admission.    Review of Systems: As per HPI otherwise 10 point review of systems negative.    Past Medical History:  Diagnosis Date  . Asthma   . Coronary artery disease   . Diabetes mellitus without complication ([place])   . Hyperlipidemia   . Hypertension   . MI (myocardial infarction) ([place])   . Migraine headache with aura   . 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    Past Surgical History:  Procedure Laterality Date  . ABDOMINAL HYSTERECTOMY    . CARDIAC CATHETERIZATION    . CARDIAC CATHETERIZATION N/A 10/18/2015   Procedure: Left Heart Cath and Cors/Grafts Angiography;  Surgeon: Lorretta Harp, MD;  Location: [facility];  Service: Cardiovascular;  Laterality: N/A;  . CARDIAC CATHETERIZATION N/A 10/18/2015   Procedure: Coronary Stent Intervention;  Surgeon: Lorretta Harp, MD;  Location: [facility];  Service: Cardiovascular;  Laterality: N/A;  . CARDIAC SURGERY    . CHOLECYSTECTOMY    . CORONARY ANGIOPLASTY    . CORONARY ARTERY BYPASS GRAFT     4 vessels - 2010  . CORONARY STENT INTERVENTION N/A 02/16/2017   Procedure: CORONARY STENT INTERVENTION;  Surgeon: Yolonda Kida, MD;  Location: [facility];  Service: Cardiovascular;  Laterality: N/A;  . CORONARY STENT INTERVENTION N/A 02/13/2019   Procedure: CORONARY STENT INTERVENTION;  Surgeon: Nelva Bush, MD;  Location: [facility];  Service: Cardiovascular;  Laterality: N/A;  SVG to RCA  . CORONARY STENT INTERVENTION Left 03/08/2019   Procedure: CORONARY STENT INTERVENTION;  Surgeon: Yolonda Kida, MD;  Location: [facility];  Service: Cardiovascular;  Laterality: Left;  . LEFT HEART CATH AND CORONARY ANGIOGRAPHY Left 02/16/2017   Procedure: LEFT HEART CATH AND CORONARY ANGIOGRAPHY;  Surgeon: Corey Skains, MD;  Location: [facility];  Service: Cardiovascular;  Laterality: Left;  . LEFT HEART CATH AND CORS/GRAFTS ANGIOGRAPHY Left 11/23/2017   Procedure: LEFT HEART CATH AND CORS/GRAFTS ANGIOGRAPHY;  Surgeon: Corey Skains, MD;  Location: [facility];  Service: Cardiovascular;  Laterality: Left;  . LEFT HEART CATH AND CORS/GRAFTS ANGIOGRAPHY N/A 02/13/2019   Procedure: LEFT HEART CATH AND CORS/GRAFTS ANGIOGRAPHY;  Surgeon:  Corey Skains, MD;  Location: [facility];  Service: Cardiovascular;  Laterality: N/A;     reports that she has never smoked. She has never used smokeless tobacco. She reports that she does not drink alcohol or use drugs.  Allergies  Allergen Reactions  . Penicillin G Anaphylaxis and Other (See Comments)    Has patient had a PCN reaction causing  immediate rash, facial/tongue/throat swelling, SOB or lightheadedness with hypotension: ZTI:[id number]} Has patient had a PCN reaction causing severe rash involving mucus membranes or skin necrosis: no:[id number]} Has patient had a PCN reaction that required hospitalization no:[id number]} Has patient had a PCN reaction occurring within the last 10 years: no:[id number]} If all of the above answers are "NO", then may proceed with Cephalosporin use.    Family History  Problem Relation Age of Onset  . Diabetes Mother   . Diabetes Father   . Cancer Father   . Diabetes Brother      Prior to Admission medications   Medication Sig Start Date End Date Taking? Authorizing Provider  albuterol (PROVENTIL HFA;VENTOLIN HFA) 108 (90 Base) MCG/ACT inhaler Inhale 2 puffs into the lungs every 6 (six) hours as needed for wheezing or shortness of breath.   Yes [provider]  amLODipine (NORVASC) 10 MG tablet Take 1 tablet (10 mg total) by mouth daily. 03/29/19  Yes Kendell Bane, NP  aspirin EC 81 MG tablet Take 81 mg by mouth daily.   Yes [provider]  atorvastatin (LIPITOR) 80 MG tablet Take 80 mg by mouth daily. 06/07/19  Yes [provider]  bisoprolol-hydrochlorothiazide (ZIAC) 5-6.25 MG tablet Take 1 tablet by mouth daily. 09/08/18  Yes Scarboro, Audie Clear, NP  clopidogrel (PLAVIX) 75 MG tablet Take 1 tablet (75 mg total) by mouth daily. 09/08/18  Yes Scarboro, Audie Clear, NP  Dulaglutide (TRULICITY) 1.5 JA/2.5KN SOPN Inject 1.5 mg into the skin once a week. Patient taking differently: Inject 1.5 mg into the skin every Thursday.  01/18/19  Yes Scarboro, Audie Clear, NP  furosemide (LASIX) 40 MG tablet Take 1 tablet (40 mg total) by mouth 2 (two) times daily. 09/08/18  Yes Scarboro, Audie Clear, NP  glimepiride (AMARYL) 1 MG tablet Take 1 tablet (1 mg total) by mouth daily with breakfast. 09/08/18  Yes Scarboro, Audie Clear, NP  isosorbide mononitrate (IMDUR) 30 MG 24 hr tablet Take 1 tablet (30 mg total) by  mouth daily. 03/10/19  Yes Callwood, Dwayne D, MD  losartan (COZAAR) 100 MG tablet Take one tab po qd Patient taking differently: Take 100 mg by mouth daily. Take one tab po qd 09/08/18  Yes Scarboro, Audie Clear, NP  montelukast (SINGULAIR) 10 MG tablet Take 1 tablet (10 mg total) by mouth daily. Patient taking differently: Take 10 mg by mouth at bedtime.  09/08/18  Yes Scarboro, Audie Clear, NP  nitroGLYCERIN (NITROSTAT) 0.4 MG SL tablet Place 0.4 mg under the tongue every 5 (five) minutes x 3 doses as needed for chest pain. *If no relief call MD or go to Emergency Room*   Yes [provider]  zolpidem (AMBIEN) 10 MG tablet Take 1 tablet (10 mg total) by mouth at bedtime. 09/08/18  Yes Kendell Bane, NP  ACCU-CHEK SOFTCLIX LANCETS lancets Use as instructed once  DAILY DIAG -E11.9 01/16/18   Lavera Guise, MD  glucose blood (ACCU-CHEK AVIVA PLUS) test strip 1 each by Other route as needed for other. Use as instructed once  DAILY DIAG -E11.9 06/18/19  Ronnell Freshwater, NP  oxybutynin (DITROPAN) 5 MG tablet Take 1 tablet (5 mg total) by mouth daily after supper. Patient not taking: Reported on 07/02/2019 09/08/18   Kendell Bane, NP  ranolazine (RANEXA) 500 MG 12 hr tablet Take 500 mg by mouth 2 (two) times daily. 06/07/19   [provider]  sertraline (ZOLOFT) 100 MG tablet Take 1 tablet (100 mg total) by mouth daily. Patient not taking: Reported on 07/02/2019 09/08/18   Kendell Bane, NP  simvastatin (ZOCOR) 40 MG tablet Take 40 mg by mouth daily.    [provider]    Physical Exam: Vitals:   07/02/19 2032 07/02/19 2033 07/02/19 2215  BP: (!) 198/104    Pulse: 89  86  Resp: 20  (!) 21  Temp: 98.9 F (37.2 C)    TempSrc: Oral    SpO2: 96%  99%  Weight:  106.6 kg   Height:  '5\' 7"'  (1.702 m)      Vitals:   07/02/19 2032 07/02/19 2033 07/02/19 2215  BP: (!) 198/104    Pulse: 89  86  Resp: 20  (!) 21  Temp: 98.9 F (37.2 C)    TempSrc: Oral    SpO2: 96%  99%    Weight:  106.6 kg   Height:  '5\' 7"'  (1.702 m)     Constitutional: NAD, alert and oriented x 3 Eyes: PERRL, lids and conjunctivae normal ENMT: Mucous membranes are moist.  Neck: normal, supple, no masses, no thyromegaly Respiratory: clear to auscultation bilaterally, no wheezing, no crackles. Normal respiratory effort. No accessory muscle use.  Cardiovascular: Regular rate and rhythm, no murmurs / rubs / gallops. No extremity edema. 2+ pedal pulses. No carotid bruits.  Abdomen: no tenderness, no masses palpated. No hepatosplenomegaly. Bowel sounds positive.  Musculoskeletal: no clubbing / cyanosis. No joint deformity upper and lower extremities.  Skin: no rashes, lesions, ulcers.  Neurologic: No gross focal neurologic deficit. Psychiatric: Normal mood and affect.   Labs on Admission: I have personally reviewed following labs and imaging studies  CBC: Recent Labs  Lab 07/02/19 2035  WBC 10.1  HGB 13.1  HCT 39.1  MCV 75.5*  PLT 696   Basic Metabolic Panel: Recent Labs  Lab 07/02/19 2035  NA 136  K 3.6  CL 100  CO2 23  GLUCOSE 185*  BUN 11  CREATININE 0.71  CALCIUM 9.4   GFR: Estimated Creatinine Clearance: 91.6 mL/min (by C-G formula based on SCr of 0.71 mg/dL). Liver Function Tests: No results for input(s): AST, ALT, ALKPHOS, BILITOT, PROT, ALBUMIN in the last 168 hours. No results for input(s): LIPASE, AMYLASE in the last 168 hours. No results for input(s): AMMONIA in the last 168 hours. Coagulation Profile: No results for input(s): INR, PROTIME in the last 168 hours. Cardiac Enzymes: No results for input(s): CKTOTAL, CKMB, CKMBINDEX, TROPONINI in the last 168 hours. BNP (last 3 results) No results for input(s): PROBNP in the last 8760 hours. HbA1C: No results for input(s): HGBA1C in the last 72 hours. CBG: No results for input(s): GLUCAP in the last 168 hours. Lipid Profile: No results for input(s): CHOL, HDL, LDLCALC, TRIG, CHOLHDL, LDLDIRECT in the last  72 hours. Thyroid Function Tests: No results for input(s): TSH, T4TOTAL, FREET4, T3FREE, THYROIDAB in the last 72 hours. Anemia Panel: No results for input(s): VITAMINB12, FOLATE, FERRITIN, TIBC, IRON, RETICCTPCT in the last 72 hours. Urine analysis:    Component Value Date/Time   APPEARANCEUR Clear 06/12/2018 1505   GLUCOSEU  Negative 06/12/2018 1505   BILIRUBINUR Negative 06/12/2018 1505   PROTEINUR Negative 06/12/2018 1505   NITRITE Negative 06/12/2018 1505   LEUKOCYTESUR Negative 06/12/2018 1505    Radiological Exams on Admission: DG Chest 2 View  Result Date: 07/02/2019 CLINICAL DATA:  Chest pain EXAM: CHEST - 2 VIEW COMPARISON:  05/30/2019 FINDINGS: Cardiomegaly status post median sternotomy. Both lungs are clear. Disc degenerative disease of the thoracic spine. IMPRESSION: Cardiomegaly without acute abnormality of the lungs. Electronically Signed   By: Eddie Candle M.D.   On: 07/02/2019 21:03    EKG: Independently reviewed.   Assessment/Plan    NSTEMI (non-ST elevated myocardial infarction) ([place]) patient with history of coronary artery disease status post CABG 2010 --Troponin elevated at 750 up from less than 3 during her recent past hospitalization --Chest pain relieved with nitroglycerin --Continue heparin infusion per consultation with Dr. Nehemiah Massed from the emergency room --nitropaste, morphine for pain --Official cardiology consult placed --Continue aspirin, home beta-blocker of bisoprolol and atorvastatin  Chronic diastolic heart failure --Euvolemic --Continue diuretics, bisoprolol and Cozaar  --Non-insulin-dependent type 2 diabetes --Hold home oral hypoglycemics --Supplemental insulin coverage --Last A1c was 7.3 in November  --Hypertensive urgency --Blood pressure in the ER was 198/104 may be related to chest pain --Home medications were resumed --Continue to monitor      DVT prophylaxis: on full dose heparin  Code Status: full  Family Communication:  none  Disposition Plan: Back to previous home environment Consults called: Dr Gerlene Burdock, cardiology     Athena Masse MD [facility]     07/02/2019, 10:52 PM

## 2019-07-03 ENCOUNTER — Encounter
Admission: EM | Disposition: A | Discharge status: Home / Self Care | Payer: Self-pay | Source: Home / Self Care | Attending: Internal Medicine

## 2019-07-03 ENCOUNTER — Other Ambulatory Visit: Payer: Self-pay

## 2019-07-03 ENCOUNTER — Encounter: Payer: Self-pay | Admitting: Internal Medicine

## 2019-07-03 DIAGNOSIS — I2510 Atherosclerotic heart disease of native coronary artery without angina pectoris: Secondary | ICD-10-CM

## 2019-07-03 DIAGNOSIS — Z951 Presence of aortocoronary bypass graft: Secondary | ICD-10-CM

## 2019-07-03 DIAGNOSIS — I5032 Chronic diastolic (congestive) heart failure: Secondary | ICD-10-CM

## 2019-07-03 HISTORY — PX: LEFT HEART CATH AND CORS/GRAFTS ANGIOGRAPHY: CATH118250

## 2019-07-03 LAB — CBC
HCT: 40.3 % (ref 36.0–46.0)
Hemoglobin: 12.8 g/dL (ref 12.0–15.0)
MCH: 24.8 pg — ABNORMAL LOW (ref 26.0–34.0)
MCHC: 31.8 g/dL (ref 30.0–36.0)
MCV: 77.9 fL — ABNORMAL LOW (ref 80.0–100.0)
Platelets: 227 10*3/uL (ref 150–400)
RBC: 5.17 MIL/uL — ABNORMAL HIGH (ref 3.87–5.11)
RDW: 16.4 % — ABNORMAL HIGH (ref 11.5–15.5)
WBC: 8.8 10*3/uL (ref 4.0–10.5)
nRBC: 0 % (ref 0.0–0.2)

## 2019-07-03 LAB — GLUCOSE, CAPILLARY
Glucose-Capillary: 218 mg/dL — ABNORMAL HIGH (ref 70–99)
Glucose-Capillary: 106 mg/dL — ABNORMAL HIGH (ref 70–99)
Glucose-Capillary: 106 mg/dL — ABNORMAL HIGH (ref 70–99)
Glucose-Capillary: 208 mg/dL — ABNORMAL HIGH (ref 70–99)
Glucose-Capillary: 146 mg/dL — ABNORMAL HIGH (ref 70–99)

## 2019-07-03 LAB — HEPARIN LEVEL (UNFRACTIONATED)
Heparin Unfractionated: 0.54 IU/mL (ref 0.30–0.70)
Heparin Unfractionated: 0.37 IU/mL (ref 0.30–0.70)

## 2019-07-03 LAB — APTT: aPTT: 151 seconds — ABNORMAL HIGH (ref 24–36)

## 2019-07-03 LAB — PROTIME-INR
INR: 1.1 (ref 0.8–1.2)
Prothrombin Time: 13.8 seconds (ref 11.4–15.2)

## 2019-07-03 LAB — TROPONIN I (HIGH SENSITIVITY): Troponin I (High Sensitivity): 2799 ng/L (ref <18)

## 2019-07-03 SURGERY — LEFT HEART CATH AND CORS/GRAFTS ANGIOGRAPHY
Anesthesia: Moderate Sedation

## 2019-07-03 MED ORDER — SODIUM CHLORIDE 0.9% FLUSH
3.0000 mL | Freq: Two times a day (BID) | INTRAVENOUS | Status: DC
  Administered 2019-07-04: 3 mL via INTRAVENOUS

## 2019-07-03 MED ORDER — METOPROLOL TARTRATE 5 MG/5ML IV SOLN
INTRAVENOUS | Status: AC
  Filled 2019-07-03: qty 5

## 2019-07-03 MED ORDER — SODIUM CHLORIDE 0.9 % IV SOLN: 250.0000 mL | INTRAVENOUS | Status: DC | PRN

## 2019-07-03 MED ORDER — SODIUM CHLORIDE 0.9% FLUSH: 3.0000 mL | INTRAVENOUS | Status: DC | PRN

## 2019-07-03 MED ORDER — LABETALOL HCL 5 MG/ML IV SOLN: 10.0000 mg | INTRAVENOUS | Status: AC | PRN

## 2019-07-03 MED ORDER — ONDANSETRON HCL 4 MG/2ML IJ SOLN: 4.0000 mg | Freq: Four times a day (QID) | INTRAMUSCULAR | Status: DC | PRN

## 2019-07-03 MED ORDER — MIDAZOLAM HCL 2 MG/2ML IJ SOLN
INTRAMUSCULAR | Status: DC | PRN
  Administered 2019-07-03 (×2): 1 mg via INTRAVENOUS

## 2019-07-03 MED ORDER — TRAMADOL HCL 50 MG PO TABS
50.0000 mg | ORAL_TABLET | Freq: Four times a day (QID) | ORAL | Status: DC | PRN
  Administered 2019-07-03: 50 mg via ORAL
  Filled 2019-07-03: qty 1

## 2019-07-03 MED ORDER — NITROGLYCERIN 2 % TD OINT
TOPICAL_OINTMENT | TRANSDERMAL | Status: AC
  Filled 2019-07-03: qty 1

## 2019-07-03 MED ORDER — MORPHINE SULFATE (PF) 2 MG/ML IV SOLN
2.0000 mg | INTRAVENOUS | Status: DC | PRN
  Administered 2019-07-03 – 2019-07-04 (×2): 2 mg via INTRAVENOUS
  Filled 2019-07-03: qty 1

## 2019-07-03 MED ORDER — FENTANYL CITRATE (PF) 100 MCG/2ML IJ SOLN
INTRAMUSCULAR | Status: DC | PRN
  Administered 2019-07-03: 50 ug via INTRAVENOUS
  Administered 2019-07-03: 25 ug via INTRAVENOUS

## 2019-07-03 MED ORDER — IOHEXOL 300 MG/ML  SOLN
INTRAMUSCULAR | Status: DC | PRN
  Administered 2019-07-03: 130 mL

## 2019-07-03 MED ORDER — SODIUM CHLORIDE 0.9 % WEIGHT BASED INFUSION: 1.0000 mL/kg/h | INTRAVENOUS | Status: DC

## 2019-07-03 MED ORDER — METOPROLOL TARTRATE 5 MG/5ML IV SOLN
2.5000 mg | Freq: Once | INTRAVENOUS | Status: AC
  Administered 2019-07-03: 2.5 mg via INTRAVENOUS

## 2019-07-03 MED ORDER — SODIUM CHLORIDE 0.9 % WEIGHT BASED INFUSION
3.0000 mL/kg/h | INTRAVENOUS | Status: DC
  Administered 2019-07-03: 3 mL/kg/h via INTRAVENOUS

## 2019-07-03 MED ORDER — HEPARIN (PORCINE) IN NACL 1000-0.9 UT/500ML-% IV SOLN
INTRAVENOUS | Status: DC | PRN
  Administered 2019-07-03: 500 mL

## 2019-07-03 MED ORDER — NITROGLYCERIN 2 % TD OINT
1.0000 [in_us] | TOPICAL_OINTMENT | Freq: Four times a day (QID) | TRANSDERMAL | Status: DC
  Administered 2019-07-03 – 2019-07-04 (×5): 1 [in_us] via TOPICAL
  Filled 2019-07-03 (×5): qty 1

## 2019-07-03 MED ORDER — ONDANSETRON HCL 4 MG/2ML IJ SOLN
INTRAMUSCULAR | Status: AC
  Filled 2019-07-03: qty 2

## 2019-07-03 MED ORDER — ASPIRIN 81 MG PO CHEW: 81.0000 mg | CHEWABLE_TABLET | ORAL | Status: DC

## 2019-07-03 MED ORDER — METOPROLOL TARTRATE 5 MG/5ML IV SOLN
2.5000 mg | Freq: Four times a day (QID) | INTRAVENOUS | Status: DC
  Administered 2019-07-03 – 2019-07-04 (×4): 2.5 mg via INTRAVENOUS
  Filled 2019-07-03 (×3): qty 5

## 2019-07-03 MED ORDER — NITROGLYCERIN 0.4 MG SL SUBL
SUBLINGUAL_TABLET | SUBLINGUAL | Status: AC
  Filled 2019-07-03: qty 1

## 2019-07-03 MED ORDER — FENTANYL CITRATE (PF) 100 MCG/2ML IJ SOLN
INTRAMUSCULAR | Status: AC
  Filled 2019-07-03: qty 2

## 2019-07-03 MED ORDER — TRAZODONE HCL 50 MG PO TABS
50.0000 mg | ORAL_TABLET | Freq: Every evening | ORAL | Status: DC | PRN
  Administered 2019-07-03: 50 mg via ORAL
  Filled 2019-07-03: qty 1

## 2019-07-03 MED ORDER — ACETAMINOPHEN 325 MG PO TABS: 650.0000 mg | ORAL_TABLET | ORAL | Status: DC | PRN

## 2019-07-03 MED ORDER — HYDRALAZINE HCL 20 MG/ML IJ SOLN: 10.0000 mg | INTRAMUSCULAR | Status: AC | PRN

## 2019-07-03 MED ORDER — MIDAZOLAM HCL 2 MG/2ML IJ SOLN
INTRAMUSCULAR | Status: AC
  Filled 2019-07-03: qty 2

## 2019-07-03 MED ORDER — HEPARIN (PORCINE) IN NACL 1000-0.9 UT/500ML-% IV SOLN
INTRAVENOUS | Status: AC
  Filled 2019-07-03: qty 1000

## 2019-07-03 MED ORDER — MORPHINE SULFATE (PF) 2 MG/ML IV SOLN
INTRAVENOUS | Status: AC
  Filled 2019-07-03: qty 1

## 2019-07-03 SURGICAL SUPPLY — 12 items
CATH INFINITI 5 FR IM (CATHETERS) ×2 IMPLANT
CATH INFINITI 5 FR LCB (CATHETERS) ×2 IMPLANT
CATH INFINITI 5FR ANG PIGTAIL (CATHETERS) ×2 IMPLANT
CATH INFINITI 5FR JL4 (CATHETERS) ×2 IMPLANT
CATH INFINITI JR4 5F (CATHETERS) ×2 IMPLANT
DEVICE CLOSURE MYNXGRIP 5F (Vascular Products) ×2 IMPLANT
KIT MANI 3VAL PERCEP (MISCELLANEOUS) ×2 IMPLANT
NEEDLE PERC 18GX7CM (NEEDLE) ×2 IMPLANT
PACK CARDIAC CATH (CUSTOM PROCEDURE TRAY) ×2 IMPLANT
PANNUS RETENTION SYSTEM 2 PAD (MISCELLANEOUS) ×2 IMPLANT
SHEATH AVANTI 5FR X 11CM (SHEATH) ×2 IMPLANT
WIRE GUIDERIGHT .035X150 (WIRE) ×2 IMPLANT

## 2019-07-03 NOTE — Consult Note (Signed)
[facility] Consultation Note  Patient ID: Felicia Woods, MRN: [id number], DOB/AGE: Jul 12, 1955 63 y.o. Admit date: 07/02/2019   Date of Consult: 07/03/2019 Primary Physician: Lavera Guise, MD Primary Cardiologist: Nehemiah Massed  Chief Complaint:  Chief Complaint  Patient presents with  . Chest Pain   Reason for Consult: Myocardial infarction  HPI: 63 y.o. female with known coronary disease status post coronary bypass graft hypertension hyperlipidemia and previous PCI and stent placement of right coronary artery which subsequently had occlusion of the right coronary artery graft and right coronary artery stenting with myocardial infarction as well as stenosis of the graft to obtuse marginal which had 2 stents in it previously.  The patient has had continued evidence of chest discomfort off and on for quite some time with maximal medication management for which the patient tolerated well.  Patient now comes in with progressive episodes of chest discomfort and now having EKG changing showing normal sinus rhythm left atrial enlargement right bundle branch block with an elevated troponin of 2799.  This is consistent with non-ST elevation, myocardial infarction.  Currently the patient is hemodynamically stable  Past Medical History:  Diagnosis Date  . Asthma   . Coronary artery disease   . Diabetes mellitus without complication ([place])   . Hyperlipidemia   . Hypertension   . MI (myocardial infarction) ([place])   . Migraine headache with aura   . 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      Surgical History:  Past Surgical History:  Procedure Laterality Date  . ABDOMINAL HYSTERECTOMY    . CARDIAC CATHETERIZATION    . CARDIAC CATHETERIZATION N/A 10/18/2015   Procedure: Left Heart Cath and Cors/Grafts Angiography;  Surgeon: Lorretta Harp, MD;  Location: [place] [facility];  Service: Cardiovascular;  Laterality: N/A;  . CARDIAC CATHETERIZATION N/A 10/18/2015   Procedure: Coronary Stent  Intervention;  Surgeon: Lorretta Harp, MD;  Location: [place] [facility];  Service: Cardiovascular;  Laterality: N/A;  . CARDIAC SURGERY    . CHOLECYSTECTOMY    . CORONARY ANGIOPLASTY    . CORONARY ARTERY BYPASS GRAFT     4 vessels - 2010  . CORONARY STENT INTERVENTION N/A 02/16/2017   Procedure: CORONARY STENT INTERVENTION;  Surgeon: Yolonda Kida, MD;  Location: [place] [facility];  Service: Cardiovascular;  Laterality: N/A;  . CORONARY STENT INTERVENTION N/A 02/13/2019   Procedure: CORONARY STENT INTERVENTION;  Surgeon: Nelva Bush, MD;  Location: [place] [facility];  Service: Cardiovascular;  Laterality: N/A;  SVG to RCA  . CORONARY STENT INTERVENTION Left 03/08/2019   Procedure: CORONARY STENT INTERVENTION;  Surgeon: Yolonda Kida, MD;  Location: [place] [facility];  Service: Cardiovascular;  Laterality: Left;  . LEFT HEART CATH AND CORONARY ANGIOGRAPHY Left 02/16/2017   Procedure: LEFT HEART CATH AND CORONARY ANGIOGRAPHY;  Surgeon: Corey Skains, MD;  Location: [place] [facility];  Service: Cardiovascular;  Laterality: Left;  . LEFT HEART CATH AND CORS/GRAFTS ANGIOGRAPHY Left 11/23/2017   Procedure: LEFT HEART CATH AND CORS/GRAFTS ANGIOGRAPHY;  Surgeon: Corey Skains, MD;  Location: [place] [facility];  Service: Cardiovascular;  Laterality: Left;  . LEFT HEART CATH AND CORS/GRAFTS ANGIOGRAPHY N/A 02/13/2019   Procedure: LEFT HEART CATH AND CORS/GRAFTS ANGIOGRAPHY;  Surgeon: Corey Skains, MD;  Location: [place] [facility];  Service: Cardiovascular;  Laterality: N/A;     Home Meds: Prior to Admission medications   Medication Sig Start Date End Date Taking? Authorizing Provider  albuterol (PROVENTIL HFA;VENTOLIN HFA) 108 (90 Base) MCG/ACT inhaler Inhale 2 puffs into the lungs every 6 (six) hours as needed for wheezing or shortness of breath.   Yes [provider]  amLODipine (NORVASC) 10 MG tablet Take 1 tablet (10 mg total) by mouth daily.  03/29/19  Yes Kendell Bane, NP  aspirin EC 81 MG tablet Take 81 mg by mouth daily.   Yes [provider]  atorvastatin (LIPITOR) 80 MG tablet Take 80 mg by mouth daily. 06/07/19  Yes [provider]  bisoprolol-hydrochlorothiazide (ZIAC) 5-6.25 MG tablet Take 1 tablet by mouth daily. 09/08/18  Yes Scarboro, Audie Clear, NP  clopidogrel (PLAVIX) 75 MG tablet Take 1 tablet (75 mg total) by mouth daily. 09/08/18  Yes Scarboro, Audie Clear, NP  Dulaglutide (TRULICITY) 1.5 EL/3.8BO SOPN Inject 1.5 mg into the skin once a week. Patient taking differently: Inject 1.5 mg into the skin every Thursday.  01/18/19  Yes Scarboro, Audie Clear, NP  furosemide (LASIX) 40 MG tablet Take 1 tablet (40 mg total) by mouth 2 (two) times daily. 09/08/18  Yes Scarboro, Audie Clear, NP  glimepiride (AMARYL) 1 MG tablet Take 1 tablet (1 mg total) by mouth daily with breakfast. 09/08/18  Yes Scarboro, Audie Clear, NP  isosorbide mononitrate (IMDUR) 30 MG 24 hr tablet Take 1 tablet (30 mg total) by mouth daily. 03/10/19  Yes Callwood, Dwayne D, MD  losartan (COZAAR) 100 MG tablet Take one tab po qd Patient taking differently: Take 100 mg by mouth daily. Take one tab po qd 09/08/18  Yes Scarboro, Audie Clear, NP  montelukast (SINGULAIR) 10 MG tablet Take 1 tablet (10 mg total) by mouth daily. Patient taking differently: Take 10 mg by mouth at bedtime.  09/08/18  Yes Scarboro, Audie Clear, NP  nitroGLYCERIN (NITROSTAT) 0.4 MG SL tablet Place 0.4 mg under the tongue every 5 (five) minutes x 3 doses as needed for chest pain. *If no relief call MD or go to Emergency Room*   Yes [provider]  zolpidem (AMBIEN) 10 MG tablet Take 1 tablet (10 mg total) by mouth at bedtime. 09/08/18  Yes Kendell Bane, NP  ACCU-CHEK SOFTCLIX LANCETS lancets Use as instructed once  DAILY DIAG -E11.9 01/16/18   Lavera Guise, MD  glucose blood (ACCU-CHEK AVIVA PLUS) test strip 1 each by Other route as needed for other. Use as instructed once  DAILY DIAG -E11.9 06/18/19    Ronnell Freshwater, NP  oxybutynin (DITROPAN) 5 MG tablet Take 1 tablet (5 mg total) by mouth daily after supper. Patient not taking: Reported on 07/02/2019 09/08/18   Kendell Bane, NP  ranolazine (RANEXA) 500 MG 12 hr tablet Take 500 mg by mouth 2 (two) times daily. 06/07/19   [provider]  sertraline (ZOLOFT) 100 MG tablet Take 1 tablet (100 mg total) by mouth daily. Patient not taking: Reported on 07/02/2019 09/08/18   Kendell Bane, NP  simvastatin (ZOCOR) 40 MG tablet Take 40 mg by mouth daily.    [provider]    Inpatient Medications:  . amLODipine  10 mg Oral Daily  . aspirin EC  81 mg Oral Daily  . atorvastatin  80 mg Oral Daily  . clopidogrel  75 mg Oral Daily  . furosemide  40 mg Oral BID  . insulin aspart  0-15 Units Subcutaneous TID WC  . isosorbide mononitrate  30 mg Oral Daily  . losartan  100 mg Oral Daily  . metoprolol tartrate  2.5 mg Intravenous Q6H  . nitroGLYCERIN  1 inch Topical Q6H  . ranolazine  500 mg Oral BID  . sodium chloride flush  3 mL Intravenous Q12H   . heparin 1,200 Units/hr (07/02/19 2218)    Allergies:  Allergies  Allergen Reactions  . Penicillin G Anaphylaxis and Other (See Comments)    Has patient had a PCN reaction causing immediate rash, facial/tongue/throat swelling, SOB or lightheadedness with hypotension: [id number]} Has patient had a PCN reaction causing severe rash involving mucus membranes or skin necrosis: no:[id number]} Has patient had a PCN reaction that required hospitalization no:[id number]} Has patient had a PCN reaction occurring within the last 10 years: no:[id number]} If all of the above answers are "NO", then may proceed with Cephalosporin use.    Social History   Socioeconomic History  . Marital status: Married    Spouse name: Jeneen Rinks  . Number of children: Not on file  . Years of education: Not on file  . Highest education level: Not on file  Occupational History  . Not on file  Tobacco Use  .  Smoking status: Never Smoker  . Smokeless tobacco: Never Used  Substance and Sexual Activity  . Alcohol use: No    Alcohol/week: 0.0 standard drinks  . Drug use: No  . Sexual activity: Yes  Other Topics Concern  . Not on file  Social History Narrative  . Not on file   Social Determinants of Health   Financial Resource Strain:   . Difficulty of Paying Living Expenses: Not on file  Food Insecurity:   . Worried About Charity fundraiser in the Last Year: Not on file  . Ran Out of Food in the Last Year: Not on file  Transportation Needs:   . Lack of Transportation (Medical): Not on file  . Lack of Transportation (Non-Medical): Not on file  Physical Activity:   . Days of Exercise per Week: Not on file  . Minutes of Exercise per Session: Not on file  Stress:   . Feeling of Stress : Not on file  Social Connections:   . Frequency of Communication with Friends and Family: Not on file  . Frequency of Social Gatherings with Friends and Family: Not on file  . Attends Religious Services: Not on file  . Active Member of Clubs or Organizations: Not on file  . Attends Archivist Meetings: Not on file  . Marital Status: Not on file  Intimate Partner Violence:   . Fear of Current or Ex-Partner: Not on file  . Emotionally Abused: Not on file  . Physically Abused: Not on file  . Sexually Abused: Not on file     Family History  Problem Relation Age of Onset  . Diabetes Mother   . Diabetes Father   . Cancer Father   . Diabetes Brother      Review of Systems Positive for chest pain shortness of breath Negative for: General:  chills, fever, night sweats or weight changes.  Cardiovascular: PND orthopnea syncope dizziness  Dermatological skin lesions rashes Respiratory: Cough congestion Urologic: Frequent urination urination at night and hematuria Abdominal: negative for nausea, vomiting, diarrhea, bright red blood per rectum, melena, or hematemesis Neurologic: negative for  visual changes, and/or hearing changes  All other systems reviewed and are otherwise negative except as noted above.  Labs: No results for input(s): CKTOTAL, CKMB, TROPONINI in the last 72 hours. Lab Results  Component Value Date   WBC 8.8 07/03/2019   HGB 12.8 07/03/2019  HCT 40.3 07/03/2019   MCV 77.9 (L) 07/03/2019   PLT 227 07/03/2019    Recent Labs  Lab 07/02/19 2035  NA 136  K 3.6  CL 100  CO2 23  BUN 11  CREATININE 0.71  CALCIUM 9.4  GLUCOSE 185*   Lab Results  Component Value Date   CHOL 153 10/20/2015   HDL 31 (L) 10/20/2015   LDLCALC 97 10/20/2015   TRIG 123 10/20/2015   No results found for: DDIMER  Radiology/Studies:  DG Chest 2 View  Result Date: 07/02/2019 CLINICAL DATA:  Chest pain EXAM: CHEST - 2 VIEW COMPARISON:  05/30/2019 FINDINGS: Cardiomegaly status post median sternotomy. Both lungs are clear. Disc degenerative disease of the thoracic spine. IMPRESSION: Cardiomegaly without acute abnormality of the lungs. Electronically Signed   By: Eddie Candle M.D.   On: 07/02/2019 21:03   NM Myocar Multi W/Spect W/Wall Motion / EF  Result Date: 06/21/2019  The study is normal.  This is a low risk study.  The left ventricular ejection fraction is normal (55-65%).  Blood pressure demonstrated a normal response to exercise.     EKG: Normal sinus rhythm left atrial enlargement right bundle branch block  Weights: Filed Weights   07/02/19 2033  Weight: 106.6 kg     Physical Exam: Blood pressure (!) 164/100, pulse 88, temperature 98.9 F (37.2 C), temperature source Oral, resp. rate 16, height '5\' 7"'  (1.702 m), weight 106.6 kg, SpO2 91 %. Body mass index is 36.81 kg/m. General: Well developed, well nourished, in no acute distress. Head eyes ears nose throat: Normocephalic, atraumatic, sclera non-icteric, no xanthomas, nares are without discharge. No apparent thyromegaly and/or mass  Lungs: Normal respiratory effort.  no wheezes, no rales, no rhonchi.   Heart: RRR with normal S1 S2. no murmur gallop, no rub, PMI is normal size and placement, carotid upstroke normal without bruit, jugular venous pressure is normal Abdomen: Soft, non-tender, non-distended with normoactive bowel sounds. No hepatomegaly. No rebound/guarding. No obvious abdominal masses. Abdominal aorta is normal size without bruit Extremities: No edema. no cyanosis, no clubbing, no ulcers  Peripheral : 2+ bilateral upper extremity pulses, 2+ bilateral femoral pulses, 2+ bilateral dorsal pedal pulse Neuro: Alert and oriented. No facial asymmetry. No focal deficit. Moves all extremities spontaneously. Musculoskeletal: Normal muscle tone without kyphosis Psych:  Responds to questions appropriately with a normal affect.    Assessment: 63 year old female with known coronary disease status post coronary bypass graft multiple PCI and stent placements with acute non-ST elevation myocardial infarction slightly improved with appropriate medication  Plan: 1.  Continue heparin nitrate and further oxygen treatment for non-ST elevation myocardial infarction 2.  Continue further evaluation including cardiac catheterization.  Patient understands the risk and benefits of cardiac catheterization.  This includes the possibility of death stroke heart attack infection bleeding blood clot.  She is at low risk for conscious sedation  Signed, Corey Skains M.D. [facility] 07/03/2019, 8:44 AM

## 2019-07-03 NOTE — Progress Notes (Signed)
ANTICOAGULATION CONSULT NOTE - Initial Consult  Pharmacy Consult for heparin Indication: chest pain/ACS  Allergies  Allergen Reactions  . Penicillin G Anaphylaxis and Other (See Comments)    Has patient had a PCN reaction causing immediate rash, facial/tongue/throat swelling, SOB or lightheadedness with hypotension: [id number]} Has patient had a PCN reaction causing severe rash involving mucus membranes or skin necrosis: no:[id number]} Has patient had a PCN reaction that required hospitalization no:[id number]} Has patient had a PCN reaction occurring within the last 10 years: no:[id number]} If all of the above answers are "NO", then may proceed with Cephalosporin use.    Patient Measurements: Height: '5\' 7"'  (170.2 cm) Weight: 235 lb (106.6 kg) IBW/kg (Calculated) : 61.6 Heparin Dosing Weight: 85.9 kg  Vital Signs: Temp: 98 F (36.7 C) (12/29 1309) Temp Source: Oral (12/29 1309) BP: 141/106 (12/29 1309) Pulse Rate: 99 (12/29 1309)  Labs: Recent Labs    07/02/19 2035 07/02/19 2329 07/03/19 0500 07/03/19 1253  HGB 13.1  --  12.8  --   HCT 39.1  --  40.3  --   PLT 217  --  227  --   APTT  --  151*  --   --   LABPROT  --  13.8  --   --   INR  --  1.1  --   --   HEPARINUNFRC  --   --  0.54 0.37  CREATININE 0.71  --   --   --   TROPONINIHS 750* 2,799*  --   --     Estimated Creatinine Clearance: 91.6 mL/min (by C-G formula based on SCr of 0.71 mg/dL).   Medical History: Past Medical History:  Diagnosis Date  . Asthma   . Coronary artery disease   . Diabetes mellitus without complication ([place])   . Hyperlipidemia   . Hypertension   . MI (myocardial infarction) ([place])   . Migraine headache with aura   . 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    Medications:  Scheduled:  . [MAR Hold] amLODipine  10 mg Oral Daily  . [START ON 07/04/2019] aspirin  81 mg Oral Pre-Cath  . [MAR Hold] aspirin EC  81 mg Oral Daily  . [MAR Hold] atorvastatin  80 mg Oral Daily  . [MAR Hold]  clopidogrel  75 mg Oral Daily  . [MAR Hold] furosemide  40 mg Oral BID  . [MAR Hold] insulin aspart  0-15 Units Subcutaneous TID WC  . [MAR Hold] isosorbide mononitrate  30 mg Oral Daily  . [MAR Hold] losartan  100 mg Oral Daily  . [MAR Hold] metoprolol tartrate  2.5 mg Intravenous Q6H  . [MAR Hold] nitroGLYCERIN  1 inch Topical Q6H  . [MAR Hold] ranolazine  500 mg Oral BID  . [MAR Hold] sodium chloride flush  3 mL Intravenous Q12H    Assessment: Patient arrives for CP w/ h/o CAD s/p CABG in 2010 found to have trop of 750 then rose to 2799. Patient not on anticoagulation PTA.  Goal of Therapy:  Heparin level 0.3-0.7 units/ml Monitor platelets by anticoagulation protocol: Yes   Plan:  12/29 @ 1253 HL 0.37 therapeutic. Will continue current rate of 1200 units/hr and confirmed no heparin interruptions. Presently, patient is in Cath lab. Will follow-up to see if heparin was interrupted; if not, will check HL with AM labs.   Kristeen Miss, PharmD Clinical Pharmacist 07/03/2019,1:51 PM

## 2019-07-03 NOTE — ED Notes (Signed)
Pt ambulatory to toilet independently. 

## 2019-07-03 NOTE — ED Notes (Signed)
Pt resting comfortably when this RN entered room.

## 2019-07-03 NOTE — Progress Notes (Signed)
[facility] [place] NAME: Felicia Woods    MR#:  [id number]  DATE OF BIRTH:  09-17-1955  SUBJECTIVE:  CHIEF COMPLAINT:   Chief Complaint  Patient presents with   Chest Pain  no chest pain anymore. Waiting for cath when I saw her in ED REVIEW OF SYSTEMS:  Review of Systems  Constitutional: Negative for diaphoresis, fever, malaise/fatigue and weight loss.  HENT: Negative for ear discharge, ear pain, hearing loss, nosebleeds, sore throat and tinnitus.   Eyes: Negative for blurred vision and pain.  Respiratory: Negative for cough, hemoptysis, shortness of breath and wheezing.   Cardiovascular: Positive for chest pain. Negative for palpitations, orthopnea and leg swelling.  Gastrointestinal: Negative for abdominal pain, blood in stool, constipation, diarrhea, heartburn, nausea and vomiting.  Genitourinary: Negative for dysuria, frequency and urgency.  Musculoskeletal: Negative for back pain and myalgias.  Skin: Negative for itching and rash.  Neurological: Negative for dizziness, tingling, tremors, focal weakness, seizures, weakness and headaches.  Psychiatric/Behavioral: Negative for depression. The patient is not nervous/anxious.     DRUG ALLERGIES:   Allergies  Allergen Reactions   Penicillin G Anaphylaxis and Other (See Comments)    Has patient had a PCN reaction causing immediate rash, facial/tongue/throat swelling, SOB or lightheadedness with hypotension: [id number] Has patient had a PCN reaction causing severe rash involving mucus membranes or skin necrosis: no:[id number]} Has patient had a PCN reaction that required hospitalization no:[id number]} Has patient had a PCN reaction occurring within the last 10 years: no:[id number]} If all of the above answers are "NO", then may proceed with Cephalosporin use.   VITALS:  Blood pressure 123/86, pulse 88, temperature 97.8 F (36.6 C), temperature source Oral, resp. rate 17, height 5\' 7"  (1.702 m),  weight 106.6 kg, SpO2 98 %. PHYSICAL EXAMINATION:  Physical Exam HENT:     Head: Normocephalic and atraumatic.  Eyes:     Conjunctiva/sclera: Conjunctivae normal.     Pupils: Pupils are equal, round, and reactive to light.  Neck:     Thyroid: No thyromegaly.     Trachea: No tracheal deviation.  Cardiovascular:     Rate and Rhythm: Normal rate and regular rhythm.     Heart sounds: Normal heart sounds.  Pulmonary:     Effort: Pulmonary effort is normal. No respiratory distress.     Breath sounds: Normal breath sounds. No wheezing.  Chest:     Chest wall: No tenderness.  Abdominal:     General: Bowel sounds are normal. There is no distension.     Palpations: Abdomen is soft.     Tenderness: There is no abdominal tenderness.  Musculoskeletal:        General: Normal range of motion.     Cervical back: Normal range of motion and neck supple.  Skin:    General: Skin is warm and dry.     Findings: No rash.  Neurological:     Mental Status: She is alert and oriented to person, place, and time.     Cranial Nerves: No cranial nerve deficit.    LABORATORY PANEL:  Female CBC Recent Labs  Lab 07/03/19 0500  WBC 8.8  HGB 12.8  HCT 40.3  PLT 227   ------------------------------------------------------------------------------------------------------------------ Chemistries  Recent Labs  Lab 07/02/19 2035  NA 136  K 3.6  CL 100  CO2 23  GLUCOSE 185*  BUN 11  CREATININE 0.71  CALCIUM 9.4   RADIOLOGY:  DG Chest 2 View  Result Date: 07/02/2019 CLINICAL DATA:  Chest pain EXAM: CHEST - 2 VIEW COMPARISON:  05/30/2019 FINDINGS: Cardiomegaly status post median sternotomy. Both lungs are clear. Disc degenerative disease of the thoracic spine. IMPRESSION: Cardiomegaly without acute abnormality of the lungs. Electronically Signed   By: Eddie Candle M.D.   On: 07/02/2019 21:03   CARDIAC CATHETERIZATION  Result Date: 07/03/2019  Mid LAD to Dist LAD lesion is 90% stenosed.   Ramus-1 lesion is 90% stenosed.  Ramus-2 lesion is 100% stenosed.  Ost Cx to Prox Cx lesion is 90% stenosed.  Prox RCA lesion is 95% stenosed.  Dist RCA lesion is 100% stenosed.  Non-stenotic Origin lesion was previously treated.  Prox Graft to Mid Graft lesion is 10% stenosed.  Non-stenotic Mid Graft lesion was previously treated.  Non-stenotic Dist Graft lesion was previously treated.  Origin to Prox Graft lesion is 100% stenosed.  Origin to Prox Graft lesion is 10% stenosed.  Mid Graft lesion is 65% stenosed.  SVG.  Prox Graft lesion is 95% stenosed.  Previously placed Origin to Prox Graft drug eluting stent is widely patent.  Balloon angioplasty was performed.  Origin lesion is 100% stenosed.  64 year old female with known coronary disease status post coronary bypass graft and hypertension hyperlipidemia on appropriate medication management with recent difficulty with multiple grafts over the last year.  This includes stented graft to the right with total occlusion, stent to graft to obtuse marginal 1, and now occlusion of graft to diagonal 1 as the culprit artery with non-ST elevation myocardial infarction Currently the patient has a patent LIMA to the LAD and patent graft to obtuse marginal 1 with significant stenoses ostial circumflex and proximal LAD Occlusion of ostial graft to diagonal artery as culprit artery stenoses for non-ST elevation myocardial infarction not amenable to PCI and stent placement Plan Dual antiplatelet therapy High intensity cholesterol therapy Medication management including ACE inhibitor beta-blocker and isosorbide for newly occluded graft and myocardial infarction Cardiac and rehabilitation No further cardiac diagnostics necessary at this time   ASSESSMENT AND PLAN:  63 y.o. female with medical history significant for coronary artery disease, status post CABG, diabetes and hypertension, hospitalized in November 2020 with chest pain and discharged with a diagnosis  of atypical chest pain after having a negative stress test now admitted for left-sided chest pain   NSTEMI (non-ST elevated myocardial infarction) ([place]) patient with history of coronary artery disease status post CABG 2010 --Troponin peaked at 2799 - s/p cath - new occlusion of graft to obtuse marginal, Patent graft with stents in it to graft obtuse #1 and patent LIMA to the LAD - continue Dual antiplatelet & High intensity cholesterol therapy - continue Imdur, losartan, metoprolol, Norvasc, lipitor  Chronic diastolic heart failure --Euvolemic --Continue diuretics, bisoprolol and Cozaar  Non-insulin-dependent type 2 diabetes --Hold home oral hypoglycemics --Supplemental insulin coverage --Last A1c was 7.3 in November  Hypertensive urgency --present on admission. Now resolved     All the records are reviewed and case discussed with Care Management/Social Worker. Management plans discussed with the patient, nursing and they are in agreement.  CODE STATUS: Full Code  TOTAL TIME TAKING CARE OF THIS PATIENT: 35 minutes.   More than 50% of the time was spent in counseling/coordination of care: YES  POSSIBLE D/C IN 1-2 DAYS, DEPENDING ON CLINICAL CONDITION. & cardio eval   Max Sane M.D on 07/03/2019 at 7:43 PM  Between 7am to 6pm - Pager - [phone]  After 6pm go to [email or web address] - password [email or web address]  [facility]  CC: Primary care physician; Lavera Guise, MD  Note: This dictation was prepared with Dragon dictation along with smaller phrase technology. Any transcriptional errors that result from this process are unintentional.

## 2019-07-03 NOTE — Progress Notes (Signed)
[facility] Encounter Note  Patient: Felicia Woods / Admit Date: 07/02/2019 / Date of Encounter: 07/03/2019, 2:01 PM   Subjective: Patient has no more chest discomfort.  Patient is feeling better from yesterday's severe chest discomfort.  Elevated troponin consistent with non-ST elevation myocardial infarction Cardiac catheterization showing patent LIMA to the LAD patent LAD with significant stenosis, patent circumflex artery with obtuse marginal with significant stenosis patent graft to obtuse marginal 1, occluded graft to right coronary artery, new occlusion of graft to obtuse marginal 2  Review of Systems: Positive for: Shortness of breath Negative for: Vision change, hearing change, syncope, dizziness, nausea, vomiting,diarrhea, bloody stool, stomach pain, cough, congestion, diaphoresis, urinary frequency, urinary pain,skin lesions, skin rashes Others previously listed  Objective: Telemetry: Normal sinus rhythm Physical Exam: Blood pressure (!) 141/106, pulse 99, temperature 98 F (36.7 C), temperature source Oral, resp. rate 18, height 5\' 7"  (1.702 m), weight 106.6 kg, SpO2 93 %. Body mass index is 36.81 kg/m. General: Well developed, well nourished, in no acute distress. Head: Normocephalic, atraumatic, sclera non-icteric, no xanthomas, nares are without discharge. Neck: No apparent masses Lungs: Normal respirations with no wheezes, no rhonchi, no rales , no crackles   Heart: Regular rate and rhythm, normal S1 S2, no murmur, no rub, no gallop, PMI is normal size and placement, carotid upstroke normal without bruit, jugular venous pressure normal Abdomen: Soft, non-tender, non-distended with normoactive bowel sounds. No hepatosplenomegaly. Abdominal aorta is normal size without bruit Extremities: No edema, no clubbing, no cyanosis, no ulcers,  Peripheral: 2+ radial, 2+ femoral, 2+ dorsal pedal pulses Neuro: Alert and oriented. Moves all extremities  spontaneously. Psych:  Responds to questions appropriately with a normal affect.  No intake or output data in the 24 hours ending 07/03/19 1401  Inpatient Medications:  . [MAR Hold] amLODipine  10 mg Oral Daily  . [START ON 07/04/2019] aspirin  81 mg Oral Pre-Cath  . [MAR Hold] aspirin EC  81 mg Oral Daily  . [MAR Hold] atorvastatin  80 mg Oral Daily  . [MAR Hold] clopidogrel  75 mg Oral Daily  . [MAR Hold] furosemide  40 mg Oral BID  . [MAR Hold] insulin aspart  0-15 Units Subcutaneous TID WC  . [MAR Hold] isosorbide mononitrate  30 mg Oral Daily  . [MAR Hold] losartan  100 mg Oral Daily  . [MAR Hold] metoprolol tartrate  2.5 mg Intravenous Q6H  . [MAR Hold] nitroGLYCERIN  1 inch Topical Q6H  . [MAR Hold] ranolazine  500 mg Oral BID  . [MAR Hold] sodium chloride flush  3 mL Intravenous Q12H   Infusions:  . sodium chloride    . [START ON 07/04/2019] sodium chloride 3 mL/kg/hr (07/03/19 1305)   Followed by  . [START ON 07/04/2019] sodium chloride    . heparin Stopped (07/03/19 1300)    Labs: Recent Labs    07/02/19 2035  NA 136  K 3.6  CL 100  CO2 23  GLUCOSE 185*  BUN 11  CREATININE 0.71  CALCIUM 9.4   No results for input(s): AST, ALT, ALKPHOS, BILITOT, PROT, ALBUMIN in the last 72 hours. Recent Labs    07/02/19 2035 07/03/19 0500  WBC 10.1 8.8  HGB 13.1 12.8  HCT 39.1 40.3  MCV 75.5* 77.9*  PLT 217 227   No results for input(s): CKTOTAL, CKMB, TROPONINI in the last 72 hours. Invalid input(s): POCBNP No results for input(s): HGBA1C in the last 72 hours.   Weights: [employer]  07/02/19 2033 07/03/19 1309  Weight: 106.6 kg 106.6 kg     Radiology/Studies:  DG Chest 2 View  Result Date: 07/02/2019 CLINICAL DATA:  Chest pain EXAM: CHEST - 2 VIEW COMPARISON:  05/30/2019 FINDINGS: Cardiomegaly status post median sternotomy. Both lungs are clear. Disc degenerative disease of the thoracic spine. IMPRESSION: Cardiomegaly without acute abnormality of the  lungs. Electronically Signed   By: Eddie Candle M.D.   On: 07/02/2019 21:03   NM Myocar Multi W/Spect W/Wall Motion / EF  Result Date: 06/21/2019  The study is normal.  This is a low risk study.  The left ventricular ejection fraction is normal (55-65%).  Blood pressure demonstrated a normal response to exercise.      Assessment and Recommendation  63 y.o. female with known coronary disease status post coronary to bypass graft with multiple worsening changes and graft stenoses over the last year with occluded graft to right coronary artery, and now a new occlusion of graft to obtuse marginal 2.  Patent graft with stents in it to graft obtuse #1 and patent LIMA to the LAD 1.  Dual antiplatelet therapy for non-ST elevation myocardial infarction with culprit being occlusion of graft to obtuse marginal 2 2.  High intensity cholesterol therapy 3.  Medication management for anginal symptoms and myocardial infarction including isosorbide losartan metoprolol 4.  Begin ambulation and follow for improvements of symptoms  Signed, Serafina Royals M.D. FACC

## 2019-07-03 NOTE — OR Nursing (Signed)
fsbs 106

## 2019-07-03 NOTE — Progress Notes (Signed)
Pt reported nausea, zofran 4 mg iv given with decrease in nausea, pt reports chest pain increased to 8/10 with nausea but declined sublingual nitroglycerin, she reports when she has pain at home she just lies quietly and it will go away. Nausea relieved 5 minutes after Zofran given and chest pain down to 7/10/

## 2019-07-03 NOTE — ED Notes (Signed)
Cardiology to bedside. 

## 2019-07-03 NOTE — ED Notes (Signed)
Nitroglycerin paste unable to scan due to pharmacist being in chart. Verbal order from MD Damita Dunnings to go ahead and give nitro paste.

## 2019-07-03 NOTE — OR Nursing (Signed)
Chest pain decreased to 6/10, lopressor IV and nitroglycerin paste (routine scheduled) applied. nausea better but not gone.

## 2019-07-03 NOTE — OR Nursing (Signed)
Felicia Woods given status update via phone. Pt waiting for bed assignment

## 2019-07-03 NOTE — OR Nursing (Signed)
Nausea gone. Pt sat up to eat lunch.

## 2019-07-03 NOTE — Progress Notes (Signed)
ANTICOAGULATION CONSULT NOTE - Initial Consult  Pharmacy Consult for heparin Indication: chest pain/ACS  Allergies  Allergen Reactions  . Penicillin G Anaphylaxis and Other (See Comments)    Has patient had a PCN reaction causing immediate rash, facial/tongue/throat swelling, SOB or lightheadedness with hypotension: [id number]} Has patient had a PCN reaction causing severe rash involving mucus membranes or skin necrosis: no:[id number]} Has patient had a PCN reaction that required hospitalization no:[id number]} Has patient had a PCN reaction occurring within the last 10 years: no:[id number]} If all of the above answers are "NO", then may proceed with Cephalosporin use.    Patient Measurements: Height: '5\' 7"'  (170.2 cm) Weight: 235 lb (106.6 kg) IBW/kg (Calculated) : 61.6 Heparin Dosing Weight: 85.9 kg  Vital Signs: Temp: 98.9 F (37.2 C) (12/28 2032) Temp Source: Oral (12/28 2032) BP: 154/92 (12/29 0030) Pulse Rate: 83 (12/29 0030)  Labs: Recent Labs    07/02/19 2035 07/02/19 2329  HGB 13.1  --   HCT 39.1  --   PLT 217  --   APTT  --  151*  LABPROT  --  13.8  INR  --  1.1  CREATININE 0.71  --   TROPONINIHS 750* 2,799*    Estimated Creatinine Clearance: 91.6 mL/min (by C-G formula based on SCr of 0.71 mg/dL).   Medical History: Past Medical History:  Diagnosis Date  . Asthma   . Coronary artery disease   . Diabetes mellitus without complication ([place])   . Hyperlipidemia   . Hypertension   . MI (myocardial infarction) ([place])   . Migraine headache with aura   . 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    Medications:  Scheduled:  . amLODipine  10 mg Oral Daily  . aspirin EC  81 mg Oral Daily  . atorvastatin  80 mg Oral Daily  . clopidogrel  75 mg Oral Daily  . furosemide  40 mg Oral BID  . insulin aspart  0-15 Units Subcutaneous TID WC  . isosorbide mononitrate  30 mg Oral Daily  . losartan  100 mg Oral Daily  . metoprolol tartrate      . metoprolol tartrate  2.5  mg Intravenous Q6H  . nitroGLYCERIN  1 inch Topical Q6H  . ranolazine  500 mg Oral BID    Assessment: Patient arrives for CP w/ h/o CAD s/p CABG in 2010 found to have trop of 750 then rose to 2799. Patient not on anticoagulation PTA.  Goal of Therapy:  Heparin level 0.3-0.7 units/ml Monitor platelets by anticoagulation protocol: Yes   Plan:  Give 4000 units bolus x 1  Will start heparin rate at 1200 units/hr Baseline labs WNL Will check anti-Xa w/ labs. Will continue to monitor daily CBC's and adjust per anti-Xa levels.  Tobie Lords, PharmD, BCPS Clinical Pharmacist 07/03/2019,3:43 AM

## 2019-07-03 NOTE — ED Notes (Signed)
Pt transported to Cath Lab

## 2019-07-03 NOTE — Progress Notes (Signed)
ANTICOAGULATION CONSULT NOTE - Initial Consult  Pharmacy Consult for heparin Indication: chest pain/ACS  Allergies  Allergen Reactions  . Penicillin G Anaphylaxis and Other (See Comments)    Has patient had a PCN reaction causing immediate rash, facial/tongue/throat swelling, SOB or lightheadedness with hypotension: [id number]} Has patient had a PCN reaction causing severe rash involving mucus membranes or skin necrosis: no:[id number]} Has patient had a PCN reaction that required hospitalization no:[id number]} Has patient had a PCN reaction occurring within the last 10 years: no:[id number]} If all of the above answers are "NO", then may proceed with Cephalosporin use.    Patient Measurements: Height: '5\' 7"'  (170.2 cm) Weight: 235 lb (106.6 kg) IBW/kg (Calculated) : 61.6 Heparin Dosing Weight: 85.9 kg  Vital Signs: Temp: 98.9 F (37.2 C) (12/28 2032) Temp Source: Oral (12/28 2032) BP: 159/110 (12/29 0430) Pulse Rate: 67 (12/29 0430)  Labs: Recent Labs    07/02/19 2035 07/02/19 2329 07/03/19 0500  HGB 13.1  --  12.8  HCT 39.1  --  40.3  PLT 217  --  227  APTT  --  151*  --   LABPROT  --  13.8  --   INR  --  1.1  --   HEPARINUNFRC  --   --  0.54  CREATININE 0.71  --   --   TROPONINIHS 750* 2,799*  --     Estimated Creatinine Clearance: 91.6 mL/min (by C-G formula based on SCr of 0.71 mg/dL).   Medical History: Past Medical History:  Diagnosis Date  . Asthma   . Coronary artery disease   . Diabetes mellitus without complication ([place])   . Hyperlipidemia   . Hypertension   . MI (myocardial infarction) ([place])   . Migraine headache with aura   . 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    Medications:  Scheduled:  . amLODipine  10 mg Oral Daily  . aspirin EC  81 mg Oral Daily  . atorvastatin  80 mg Oral Daily  . clopidogrel  75 mg Oral Daily  . furosemide  40 mg Oral BID  . insulin aspart  0-15 Units Subcutaneous TID WC  . isosorbide mononitrate  30 mg Oral Daily  .  losartan  100 mg Oral Daily  . metoprolol tartrate  2.5 mg Intravenous Q6H  . nitroGLYCERIN  1 inch Topical Q6H  . ranolazine  500 mg Oral BID    Assessment: Patient arrives for CP w/ h/o CAD s/p CABG in 2010 found to have trop of 750 then rose to 2799. Patient not on anticoagulation PTA.  Goal of Therapy:  Heparin level 0.3-0.7 units/ml Monitor platelets by anticoagulation protocol: Yes   Plan:  12/29 @ 0500 HL 0.54 therapeutic. Will continue current rate and will recheck at 1100, CBC stable will continue to monitor.  Tobie Lords, PharmD, BCPS Clinical Pharmacist 07/03/2019,6:33 AM

## 2019-07-04 ENCOUNTER — Encounter: Payer: Self-pay | Admitting: Cardiology

## 2019-07-04 DIAGNOSIS — E1159 Type 2 diabetes mellitus with other circulatory complications: Secondary | ICD-10-CM

## 2019-07-04 LAB — CBC
HCT: 34.8 % — ABNORMAL LOW (ref 36.0–46.0)
Hemoglobin: 11.8 g/dL — ABNORMAL LOW (ref 12.0–15.0)
MCH: 25.8 pg — ABNORMAL LOW (ref 26.0–34.0)
MCHC: 33.9 g/dL (ref 30.0–36.0)
MCV: 76 fL — ABNORMAL LOW (ref 80.0–100.0)
Platelets: 217 10*3/uL (ref 150–400)
RBC: 4.58 MIL/uL (ref 3.87–5.11)
RDW: 16.9 % — ABNORMAL HIGH (ref 11.5–15.5)
WBC: 8.4 10*3/uL (ref 4.0–10.5)
nRBC: 0 % (ref 0.0–0.2)

## 2019-07-04 LAB — BASIC METABOLIC PANEL
Anion gap: 10 (ref 5–15)
BUN: 14 mg/dL (ref 8–23)
CO2: 25 mmol/L (ref 22–32)
Calcium: 8.7 mg/dL — ABNORMAL LOW (ref 8.9–10.3)
Chloride: 101 mmol/L (ref 98–111)
Creatinine, Ser: 0.94 mg/dL (ref 0.44–1.00)
GFR calc Af Amer: >60 mL/min (ref >60)
GFR calc non Af Amer: >60 mL/min (ref >60)
Glucose, Bld: 145 mg/dL — ABNORMAL HIGH (ref 70–99)
Potassium: 4 mmol/L (ref 3.5–5.1)
Sodium: 136 mmol/L (ref 135–145)

## 2019-07-04 LAB — GLUCOSE, CAPILLARY
Glucose-Capillary: 145 mg/dL — ABNORMAL HIGH (ref 70–99)
Glucose-Capillary: 245 mg/dL — ABNORMAL HIGH (ref 70–99)

## 2019-07-04 NOTE — Discharge Summary (Signed)
[facility] NAME: Felicia Woods    MR#:  [id number]  DATE OF BIRTH:  10/09/1955  DATE OF ADMISSION:  07/02/2019 ADMITTING PHYSICIAN: Athena Masse, MD  DATE OF DISCHARGE:07/04/2019  PRIMARY CARE PHYSICIAN: Lavera Guise, MD    ADMISSION DIAGNOSIS:  NSTEMI (non-ST elevated myocardial infarction) [facility]) [I21.4] Chest pain in adult [R07.9]  DISCHARGE DIAGNOSIS:  NSTEMI s/p cath--continue medical management  SECONDARY DIAGNOSIS:   Past Medical History:  Diagnosis Date  . Asthma   . Coronary artery disease   . Diabetes mellitus without complication ([place])   . Hyperlipidemia   . Hypertension   . MI (myocardial infarction) ([place])   . Migraine headache with aura   . 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    HOSPITAL COURSE:   63 y.o.femalewith medical history significant forcoronary artery disease, status post CABG, diabetes and hypertension, hospitalized in November 2020 with chest pain and discharged with a diagnosis of atypical chest pain after having a negative stress test now admitted for left-sided chest pain   NSTEMI (non-ST elevated myocardial infarction) (HCC)patient with history of coronary artery disease status post CABG 2010 --Troponin peaked at 2799 - s/p cath - new occlusion of graft to obtuse marginal,Patent graft with stents in it to graft obtuse #1 and patent LIMA to the LAD - continueDual antiplatelet &High intensity cholesterol therapy - continue Imdur, losartan, metoprolol, Norvasc, lipitor -pt cp free and no sob  Chronic diastolic heart failure --Euvolemic --Continue diuretics, bisoprolol and Cozaar  Non-insulin-dependent type 2 diabetes, uncontrolled, hyperglycemia--mild - resumehome oral hypoglycemics --Supplemental insulin coverage --Last A1c was 7.3 in November  Hypertensive urgency --present on admission. Now resolved  Overall stable. Pt will d/c with outpt f/u cardiology next  week CONSULTS OBTAINED:    DRUG ALLERGIES:   Allergies  Allergen Reactions  . Penicillin G Anaphylaxis and Other (See Comments)    Has patient had a PCN reaction causing immediate rash, facial/tongue/throat swelling, SOB or lightheadedness with hypotension: [id number]} Has patient had a PCN reaction causing severe rash involving mucus membranes or skin necrosis: no:[id number]} Has patient had a PCN reaction that required hospitalization no:[id number]} Has patient had a PCN reaction occurring within the last 10 years: no:[id number]} If all of the above answers are "NO", then may proceed with Cephalosporin use.    DISCHARGE MEDICATIONS:   Allergies as of 07/04/2019      Reactions   Penicillin G Anaphylaxis, Other (See Comments)   Has patient had a PCN reaction causing immediate rash, facial/tongue/throat swelling, SOB or lightheadedness with hypotension: [id number]} Has patient had a PCN reaction causing severe rash involving mucus membranes or skin necrosis: no:[id number]} Has patient had a PCN reaction that required hospitalization no:[id number]} Has patient had a PCN reaction occurring within the last 10 years: no:[id number]} If all of the above answers are "NO", then may proceed with Cephalosporin use.      Medication List    TAKE these medications   Accu-Chek Aviva Plus test strip Generic drug: glucose blood 1 each by Other route as needed for other. Use as instructed once  DAILY DIAG -E11.9   Accu-Chek Softclix Lancets lancets Use as instructed once  DAILY DIAG -E11.9   albuterol 108 (90 Base) MCG/ACT inhaler Commonly known as: VENTOLIN HFA Inhale 2 puffs into the lungs every 6 (six) hours as needed for wheezing or shortness of breath.   amLODipine 10 MG tablet Commonly known as: NORVASC Take 1 tablet (10 mg total)  by mouth daily.   aspirin EC 81 MG tablet Take 81 mg by mouth daily.   atorvastatin 80 MG tablet Commonly known as: LIPITOR Take 80 mg by mouth daily.    bisoprolol-hydrochlorothiazide 5-6.25 MG tablet Commonly known as: ZIAC Take 1 tablet by mouth daily.   clopidogrel 75 MG tablet Commonly known as: PLAVIX Take 1 tablet (75 mg total) by mouth daily.   furosemide 40 MG tablet Commonly known as: LASIX Take 1 tablet (40 mg total) by mouth 2 (two) times daily.   glimepiride 1 MG tablet Commonly known as: AMARYL Take 1 tablet (1 mg total) by mouth daily with breakfast.   isosorbide mononitrate 30 MG 24 hr tablet Commonly known as: IMDUR Take 1 tablet (30 mg total) by mouth daily.   losartan 100 MG tablet Commonly known as: COZAAR Take one tab po qd What changed:   how much to take  how to take this  when to take this   montelukast 10 MG tablet Commonly known as: SINGULAIR Take 1 tablet (10 mg total) by mouth daily. What changed: when to take this   nitroGLYCERIN 0.4 MG SL tablet Commonly known as: NITROSTAT Place 0.4 mg under the tongue every 5 (five) minutes x 3 doses as needed for chest pain. *If no relief call MD or go to Emergency Room*   oxybutynin 5 MG tablet Commonly known as: DITROPAN Take 1 tablet (5 mg total) by mouth daily after supper.   ranolazine 500 MG 12 hr tablet Commonly known as: RANEXA Take 500 mg by mouth 2 (two) times daily.   sertraline 100 MG tablet Commonly known as: ZOLOFT Take 1 tablet (100 mg total) by mouth daily.   simvastatin 40 MG tablet Commonly known as: ZOCOR Take 40 mg by mouth daily.   Trulicity 1.5 YQ/6.5HQ Sopn Generic drug: Dulaglutide Inject 1.5 mg into the skin once a week. What changed: when to take this   zolpidem 10 MG tablet Commonly known as: AMBIEN Take 1 tablet (10 mg total) by mouth at bedtime.       If you experience worsening of your admission symptoms, develop shortness of breath, life threatening emergency, suicidal or homicidal thoughts you must seek medical attention immediately by calling 911 or calling your MD immediately  if symptoms less  severe.  You Must read complete instructions/literature along with all the possible adverse reactions/side effects for all the Medicines you take and that have been prescribed to you. Take any new Medicines after you have completely understood and accept all the possible adverse reactions/side effects.   Please note  You were cared for by a hospitalist during your hospital stay. If you have any questions about your discharge medications or the care you received while you were in the hospital after you are discharged, you can call the unit and asked to speak with the hospitalist on call if the hospitalist that took care of you is not available. Once you are discharged, your primary care physician will handle any further medical issues. Please note that NO REFILLS for any discharge medications will be authorized once you are discharged, as it is imperative that you return to your primary care physician (or establish a relationship with a primary care physician if you do not have one) for your aftercare needs so that they can reassess your need for medications and monitor your lab values. Today   SUBJECTIVE  No chest pain I am ready to go home   VITAL SIGNS:  Blood  pressure 107/78, pulse 83, temperature 98 F (36.7 C), temperature source Oral, resp. rate 18, height '5\' 7"'  (1.702 m), weight 106.6 kg, SpO2 96 %.  I/O:    Intake/Output Summary (Last 24 hours) at 07/04/2019 1342 Last data filed at 07/04/2019 1017 Gross per 24 hour  Intake 2252.35 ml  Output 500 ml  Net 1752.35 ml    PHYSICAL EXAMINATION:  GENERAL:  63 y.o.-year-old patient lying in the bed with no acute distress.  EYES: Pupils equal, round, reactive to light and accommodation. No scleral icterus. Extraocular muscles intact.  HEENT: Head atraumatic, normocephalic. Oropharynx and nasopharynx clear.  NECK:  Supple, no jugular venous distention. No thyroid enlargement, no tenderness.  LUNGS: Normal breath sounds bilaterally, no  wheezing, rales,rhonchi or crepitation. No use of accessory muscles of respiration.  CARDIOVASCULAR: S1, S2 normal. No murmurs, rubs, or gallops.  ABDOMEN: Soft, non-tender, non-distended. Bowel sounds present. No organomegaly or mass.  EXTREMITIES: No pedal edema, cyanosis, or clubbing.  NEUROLOGIC: Cranial nerves II through XII are intact. Muscle strength 5/5 in all extremities. Sensation intact. Gait not checked.  PSYCHIATRIC: The patient is alert and oriented x 3.  SKIN: No obvious rash, lesion, or ulcer.   DATA REVIEW:   CBC  Recent Labs  Lab 07/04/19 0517  WBC 8.4  HGB 11.8*  HCT 34.8*  PLT 217    Chemistries  Recent Labs  Lab 07/04/19 0517  NA 136  K 4.0  CL 101  CO2 25  GLUCOSE 145*  BUN 14  CREATININE 0.94  CALCIUM 8.7*    Microbiology Results   Recent Results (from the past 240 hour(s))  Respiratory Panel by RT PCR (Flu A&B, Covid) - Nasopharyngeal Swab     Status: None   Collection Time: 07/02/19 10:22 PM   Specimen: Nasopharyngeal Swab  Result Value Ref Range Status   SARS Coronavirus 2 by RT PCR NEGATIVE NEGATIVE Final    Comment: (NOTE) SARS-CoV-2 target nucleic acids are NOT DETECTED. The SARS-CoV-2 RNA is generally detectable in upper respiratoy specimens during the acute phase of infection. The lowest concentration of SARS-CoV-2 viral copies this assay can detect is 131 copies/mL. A negative result does not preclude SARS-Cov-2 infection and should not be used as the sole basis for treatment or other patient management decisions. A negative result may occur with  improper specimen collection/handling, submission of specimen other than nasopharyngeal swab, presence of viral mutation(s) within the areas targeted by this assay, and inadequate number of viral copies (<131 copies/mL). A negative result must be combined with clinical observations, patient history, and epidemiological information. The expected result is Negative. Fact Sheet for  Patients:  PinkCheek.be Fact Sheet for Healthcare Providers:  GravelBags.it This test is not yet ap proved or cleared by the [place] FDA and  has been authorized for detection and/or diagnosis of SARS-CoV-2 by FDA under an Emergency Use Authorization (EUA). This EUA will remain  in effect (meaning this test can be used) for the duration of the COVID-19 declaration under Section 564(b)(1) of the Act, 21 U.S.C. section 360bbb-3(b)(1), unless the authorization is terminated or revoked sooner.    Influenza A by PCR NEGATIVE NEGATIVE Final   Influenza B by PCR NEGATIVE NEGATIVE Final    Comment: (NOTE) The Xpert Xpress SARS-CoV-2/FLU/RSV assay is intended as an aid in  the diagnosis of influenza from Nasopharyngeal swab specimens and  should not be used as a sole basis for treatment. Nasal washings and  aspirates are unacceptable for Xpert Xpress SARS-CoV-2/FLU/RSV  testing. Fact Sheet for Patients: PinkCheek.be Fact Sheet for Healthcare Providers: GravelBags.it This test is not yet approved or cleared by the [place] FDA and  has been authorized for detection and/or diagnosis of SARS-CoV-2 by  FDA under an Emergency Use Authorization (EUA). This EUA will remain  in effect (meaning this test can be used) for the duration of the  Covid-19 declaration under Section 564(b)(1) of the Act, 21  U.S.C. section 360bbb-3(b)(1), unless the authorization is  terminated or revoked. Performed at [facility], [address] [place], [place] [address]     RADIOLOGY:  DG Chest 2 View  Result Date: 07/02/2019 CLINICAL DATA:  Chest pain EXAM: CHEST - 2 VIEW COMPARISON:  05/30/2019 FINDINGS: Cardiomegaly status post median sternotomy. Both lungs are clear. Disc degenerative disease of the thoracic spine. IMPRESSION: Cardiomegaly without acute abnormality of the  lungs. Electronically Signed   By: Eddie Candle M.D.   On: 07/02/2019 21:03   CARDIAC CATHETERIZATION  Result Date: 07/03/2019  Mid LAD to Dist LAD lesion is 90% stenosed.  Ramus-1 lesion is 90% stenosed.  Ramus-2 lesion is 100% stenosed.  Ost Cx to Prox Cx lesion is 90% stenosed.  Prox RCA lesion is 95% stenosed.  Dist RCA lesion is 100% stenosed.  Non-stenotic Origin lesion was previously treated.  Prox Graft to Mid Graft lesion is 10% stenosed.  Non-stenotic Mid Graft lesion was previously treated.  Non-stenotic Dist Graft lesion was previously treated.  Origin to Prox Graft lesion is 100% stenosed.  Origin to Prox Graft lesion is 10% stenosed.  Mid Graft lesion is 65% stenosed.  SVG.  Prox Graft lesion is 95% stenosed.  Previously placed Origin to Prox Graft drug eluting stent is widely patent.  Balloon angioplasty was performed.  Origin lesion is 100% stenosed.  63 year old female with known coronary disease status post coronary bypass graft and hypertension hyperlipidemia on appropriate medication management with recent difficulty with multiple grafts over the last year.  This includes stented graft to the right with total occlusion, stent to graft to obtuse marginal 1, and now occlusion of graft to diagonal 1 as the culprit artery with non-ST elevation myocardial infarction Currently the patient has a patent LIMA to the LAD and patent graft to obtuse marginal 1 with significant stenoses ostial circumflex and proximal LAD Occlusion of ostial graft to diagonal artery as culprit artery stenoses for non-ST elevation myocardial infarction not amenable to PCI and stent placement Plan Dual antiplatelet therapy High intensity cholesterol therapy Medication management including ACE inhibitor beta-blocker and isosorbide for newly occluded graft and myocardial infarction Cardiac and rehabilitation No further cardiac diagnostics necessary at this time     CODE STATUS:     Code Status Orders   (From admission, onward)         Start     Ordered   07/02/19 2245  Full code  Continuous     07/02/19 2246        Code Status History    Date Active Date Inactive Code Status Order ID Comments User Context   05/30/2019 2337 05/31/2019 1715 Full Code [id number]  Toy Baker, MD Inpatient   03/08/2019 1602 03/09/2019 1854 Full Code [id number]  Yolonda Kida, MD Inpatient   03/08/2019 1532 03/08/2019 1602 Full Code [id number]  Yolonda Kida, MD Inpatient   02/13/2019 1927 02/14/2019 1839 Full Code [id number]  Nelva Bush, MD Inpatient   12/14/2017 0143 12/15/2017 1644 Full Code [id number]  Amelia Jo, MD ED  11/23/2017 1254 11/23/2017 1801 Full Code [id number]  Corey Skains, MD Inpatient   11/02/2017 0216 11/03/2017 1818 Full Code [id number]  Arta Silence, MD Inpatient   02/16/2017 1024 02/17/2017 1245 Full Code [id number]  Yolonda Kida, MD Inpatient   Advance Care Planning Activity       TOTAL TIME TAKING CARE OF THIS PATIENT: 40 minutes.    Fritzi Mandes M.D on 07/04/2019 at 1:42 PM  Between 7am to 6pm - Pager - [phone] After 6pm go to [email or web address] - password [email or web address]  [facility]    CC: Primary care physician; Lavera Guise, MD

## 2019-07-04 NOTE — Progress Notes (Signed)
[facility] Encounter Note  Patient: Felicia Woods / Admit Date: 07/02/2019 / Date of Encounter: 07/04/2019, 8:43 AM   Subjective: Patient has no more chest discomfort.  Patient is feeling better from admission severe chest discomfort.  Elevated troponin consistent with non-ST elevation myocardial infarction Cardiac catheterization showing patent LIMA to the LAD patent LAD with significant stenosis, patent circumflex artery with obtuse marginal with significant stenosis patent graft to obtuse marginal 1, occluded graft to right coronary artery, new occlusion of graft to obtuse marginal 2 Discussed medical management current non-ST elevation myocardial infarction Review of Systems: Positive for: Shortness of breath Negative for: Vision change, hearing change, syncope, dizziness, nausea, vomiting,diarrhea, bloody stool, stomach pain, cough, congestion, diaphoresis, urinary frequency, urinary pain,skin lesions, skin rashes Others previously listed  Objective: Telemetry: Normal sinus rhythm Physical Exam: Blood pressure 107/78, pulse 83, temperature 98 F (36.7 C), temperature source Oral, resp. rate 18, height 5\' 7"  (1.702 m), weight 106.6 kg, SpO2 96 %. Body mass index is 36.81 kg/m. General: Well developed, well nourished, in no acute distress. Head: Normocephalic, atraumatic, sclera non-icteric, no xanthomas, nares are without discharge. Neck: No apparent masses Lungs: Normal respirations with no wheezes, no rhonchi, no rales , no crackles   Heart: Regular rate and rhythm, normal S1 S2, no murmur, no rub, no gallop, PMI is normal size and placement, carotid upstroke normal without bruit, jugular venous pressure normal Abdomen: Soft, non-tender, non-distended with normoactive bowel sounds. No hepatosplenomegaly. Abdominal aorta is normal size without bruit Extremities: No edema, no clubbing, no cyanosis, no ulcers,  Peripheral: 2+ radial, 2+ femoral, 2+ dorsal pedal  pulses Neuro: Alert and oriented. Moves all extremities spontaneously. Psych:  Responds to questions appropriately with a normal affect.   Intake/Output Summary (Last 24 hours) at 07/04/2019 0843 Last data filed at 07/04/2019 0551 Gross per 24 hour  Intake 2012.35 ml  Output 500 ml  Net 1512.35 ml    Inpatient Medications:  . amLODipine  10 mg Oral Daily  . aspirin EC  81 mg Oral Daily  . atorvastatin  80 mg Oral Daily  . clopidogrel  75 mg Oral Daily  . furosemide  40 mg Oral BID  . insulin aspart  0-15 Units Subcutaneous TID WC  . isosorbide mononitrate  30 mg Oral Daily  . losartan  100 mg Oral Daily  . metoprolol tartrate  2.5 mg Intravenous Q6H  . nitroGLYCERIN  1 inch Topical Q6H  . sodium chloride flush  3 mL Intravenous Q12H   Infusions:    Labs: Recent Labs    07/02/19 2035 07/04/19 0517  NA 136 136  K 3.6 4.0  CL 100 101  CO2 23 25  GLUCOSE 185* 145*  BUN 11 14  CREATININE 0.71 0.94  CALCIUM 9.4 8.7*   No results for input(s): AST, ALT, ALKPHOS, BILITOT, PROT, ALBUMIN in the last 72 hours. Recent Labs    07/03/19 0500 07/04/19 0517  WBC 8.8 8.4  HGB 12.8 11.8*  HCT 40.3 34.8*  MCV 77.9* 76.0*  PLT 227 217   No results for input(s): CKTOTAL, CKMB, TROPONINI in the last 72 hours. Invalid input(s): POCBNP No results for input(s): HGBA1C in the last 72 hours.   Weights: Filed Weights   07/02/19 2033 07/03/19 1309  Weight: 106.6 kg 106.6 kg     Radiology/Studies:  DG Chest 2 View  Result Date: 07/02/2019 CLINICAL DATA:  Chest pain EXAM: CHEST - 2 VIEW COMPARISON:  05/30/2019 FINDINGS: Cardiomegaly status post median  sternotomy. Both lungs are clear. Disc degenerative disease of the thoracic spine. IMPRESSION: Cardiomegaly without acute abnormality of the lungs. Electronically Signed   By: Eddie Candle M.D.   On: 07/02/2019 21:03   CARDIAC CATHETERIZATION  Result Date: 07/03/2019  Mid LAD to Dist LAD lesion is 90% stenosed.  Ramus-1  lesion is 90% stenosed.  Ramus-2 lesion is 100% stenosed.  Ost Cx to Prox Cx lesion is 90% stenosed.  Prox RCA lesion is 95% stenosed.  Dist RCA lesion is 100% stenosed.  Non-stenotic Origin lesion was previously treated.  Prox Graft to Mid Graft lesion is 10% stenosed.  Non-stenotic Mid Graft lesion was previously treated.  Non-stenotic Dist Graft lesion was previously treated.  Origin to Prox Graft lesion is 100% stenosed.  Origin to Prox Graft lesion is 10% stenosed.  Mid Graft lesion is 65% stenosed.  SVG.  Prox Graft lesion is 95% stenosed.  Previously placed Origin to Prox Graft drug eluting stent is widely patent.  Balloon angioplasty was performed.  Origin lesion is 100% stenosed.  63 year old female with known coronary disease status post coronary bypass graft and hypertension hyperlipidemia on appropriate medication management with recent difficulty with multiple grafts over the last year.  This includes stented graft to the right with total occlusion, stent to graft to obtuse marginal 1, and now occlusion of graft to diagonal 1 as the culprit artery with non-ST elevation myocardial infarction Currently the patient has a patent LIMA to the LAD and patent graft to obtuse marginal 1 with significant stenoses ostial circumflex and proximal LAD Occlusion of ostial graft to diagonal artery as culprit artery stenoses for non-ST elevation myocardial infarction not amenable to PCI and stent placement Plan Dual antiplatelet therapy High intensity cholesterol therapy Medication management including ACE inhibitor beta-blocker and isosorbide for newly occluded graft and myocardial infarction Cardiac and rehabilitation No further cardiac diagnostics necessary at this time   NM Myocar Multi W/Spect W/Wall Motion / EF  Result Date: 06/21/2019  The study is normal.  This is a low risk study.  The left ventricular ejection fraction is normal (55-65%).  Blood pressure demonstrated a normal response  to exercise.      Assessment and Recommendation  63 y.o. female with known coronary disease status post coronary to bypass graft with multiple worsening changes and graft stenoses over the last year with occluded graft to right coronary artery, and now a new occlusion of graft to obtuse marginal 2.  Patent graft with stents in it to graft obtuse #1 and patent LIMA to the LAD.  Now with need for medication management of non-ST elevation myocardial infarction 1.  Dual antiplatelet therapy for non-ST elevation myocardial infarction with culprit being occlusion of graft to obtuse marginal 2 2.  High intensity cholesterol therapy 3.  Medication management for anginal symptoms and myocardial infarction including isosorbide losartan metoprolol as before without change 4.  Begin ambulation and follow for improvements of symptoms today.  If patient ambulating well with no further significant symptoms okay for discharge to home from cardiac standpoint with follow-up in 1 to 2 weeks for further adjustments of medication management  Signed, Serafina Royals M.D. FACC

## 2019-07-04 NOTE — Progress Notes (Signed)
Patient is being discharge home, discharge instruction provided, Iv removed tele removed

## 2019-07-04 NOTE — Progress Notes (Signed)
Cardiovascular and Pulmonary Nurse Navigator Note:    63 year old female with history of CAD - s/p coronary artery bypass graft with multiple worsening changes and graft stenosis  over the last year with occluded graft to right coronary artery, and now a new occlusion of graft to obtuse marginal 2.  Patient ruled in for NSTEMI.  Plan per cardiology is for medication management of NSTEMI.      NOTE:   Cardiac Rehab received referral to outpatient Cardiac Rehab with diagnosis of NSTEMI.  This RN reached out to patient to inform her that her Cardiologist has referred her to outpatient Cardiac Rehab at [facility].   Patient had previously been referred, but was not able to attend  in September 2020 due to her daughter being deployed and patient needed to help care for her grandchildren.  Patient agreeable to being contacted in two weeks by Cardiac Rehab to schedule her first appointment.    Roanna Epley, RN, BSN, [facility]  Cardiovascular & Pulmonary Nurse Navigator  Direct Line: [phone]  Department Phone #: [phone] Fax: [phone]  Email Address: [email or web address]

## 2019-07-10 ENCOUNTER — Telehealth: Payer: Self-pay

## 2019-07-10 NOTE — Telephone Encounter (Signed)
Confirmed appointment with patient. klh °

## 2019-07-12 ENCOUNTER — Other Ambulatory Visit: Payer: Self-pay

## 2019-07-12 ENCOUNTER — Ambulatory Visit (INDEPENDENT_AMBULATORY_CARE_PROVIDER_SITE_OTHER): Payer: Medicare Other | Admitting: Adult Health

## 2019-07-12 ENCOUNTER — Encounter: Payer: Self-pay | Admitting: Adult Health

## 2019-07-12 VITALS — BP 149/89 | HR 80 | Temp 97.3°F | Resp 16 | Ht 67.0 in | Wt 263.0 lb

## 2019-07-12 DIAGNOSIS — G8918 Other acute postprocedural pain: Secondary | ICD-10-CM

## 2019-07-12 DIAGNOSIS — I25708 Atherosclerosis of coronary artery bypass graft(s), unspecified, with other forms of angina pectoris: Secondary | ICD-10-CM | POA: Diagnosis not present

## 2019-07-12 DIAGNOSIS — R1031 Right lower quadrant pain: Secondary | ICD-10-CM | POA: Diagnosis not present

## 2019-07-12 DIAGNOSIS — I10 Essential (primary) hypertension: Secondary | ICD-10-CM

## 2019-07-12 DIAGNOSIS — I252 Old myocardial infarction: Secondary | ICD-10-CM

## 2019-07-12 DIAGNOSIS — I2119 ST elevation (STEMI) myocardial infarction involving other coronary artery of inferior wall: Secondary | ICD-10-CM | POA: Diagnosis not present

## 2019-07-12 DIAGNOSIS — I5032 Chronic diastolic (congestive) heart failure: Secondary | ICD-10-CM | POA: Diagnosis not present

## 2019-07-12 DIAGNOSIS — E119 Type 2 diabetes mellitus without complications: Secondary | ICD-10-CM

## 2019-07-12 MED ORDER — OXYCODONE-ACETAMINOPHEN 5-325 MG PO TABS: 1.0000 | ORAL_TABLET | ORAL | 0 refills | Status: DC | PRN

## 2019-07-12 MED ORDER — OXYCODONE-ACETAMINOPHEN 5-325 MG PO TABS: 1.0000 | ORAL_TABLET | Freq: Three times a day (TID) | ORAL | 0 refills | Status: DC | PRN

## 2019-07-12 NOTE — Progress Notes (Signed)
[facility] [place], [place] [address]  Internal MEDICINE  Office Visit Note  Patient Name: Felicia Woods  [id number]  [id number]  Date of Service: 07/12/2019   Chief Complaint  Patient presents with  . Hospitalization Follow-up    heart attack  . Diabetes  . Leg Pain    right     HPI Pt is here for recent hospital follow up. Patient was watching TV at home on December 28th and abruptly started experiencing chest pain. On arrival to ED, EKG was normal but troponin levels were greatly increased. Patient went for cardiac catheterization , S/P stent placement. Patient has remained chest pain free at rest, does experience chest pain with exertion with walking extended periods of time. Blood pressure on admission to ED was elevated, monitored and controlled throughout hospital course, no changes in medications at discharge. Blood pressure elevated today but she explains that she is experiencing right groin pain from her catheterization insertion site. Patient was euvolemic during hospital course, not requiring diuresis due to chronic diastolic heart failure. Is scheduled to follow-up with Dr. Nehemiah Massed today later this afternoon. Blood sugar levels were well controlled during her hospital course, no changes in her medications at discharge. Her only concern today is that she is still experiencing severe right groin pain from catheter insertion site from catheterization. She denies any signs of infection or hematoma at insertion site.   Hospital course coped from Dr. Gus Height Patel's discharge summary: "NSTEMI (non-ST elevated myocardial infarction)(HCC)patient with history of coronary artery disease status post CABG 2010 --Troponinpeaked at 2799 - s/p cath -new occlusion of graft to obtuse marginal,Patent graft with stents in it to graft obtuse #1 and patent LIMA to the LAD - continueDual antiplatelet&High intensity cholesterol therapy - continue  Imdur,losartan,metoprolol, Norvasc, lipitor -pt cp free and no sob  Chronic diastolic heart failure --Euvolemic --Continue diuretics, bisoprolol and Cozaar  Non-insulin-dependent type 2 diabetes, uncontrolled, hyperglycemia--mild - resumehome oral hypoglycemics --Supplemental insulin coverage --Last A1c was 7.3 in November  Hypertensive urgency --present on admission. Now resolved"  Current Medication: Outpatient Encounter Medications as of 07/12/2019  Medication Sig Note  . ACCU-CHEK SOFTCLIX LANCETS lancets Use as instructed once  DAILY DIAG -E11.9   . albuterol (PROVENTIL HFA;VENTOLIN HFA) 108 (90 Base) MCG/ACT inhaler Inhale 2 puffs into the lungs every 6 (six) hours as needed for wheezing or shortness of breath.   Marland Kitchen amLODipine (NORVASC) 10 MG tablet Take 1 tablet (10 mg total) by mouth daily.   Marland Kitchen aspirin EC 81 MG tablet Take 81 mg by mouth daily.   Marland Kitchen atorvastatin (LIPITOR) 80 MG tablet Take 80 mg by mouth daily.   . bisoprolol-hydrochlorothiazide (ZIAC) 5-6.25 MG tablet Take 1 tablet by mouth daily.   . clopidogrel (PLAVIX) 75 MG tablet Take 1 tablet (75 mg total) by mouth daily.   . Dulaglutide (TRULICITY) 1.5 SV/7.7LT SOPN Inject 1.5 mg into the skin once a week. (Patient taking differently: Inject 1.5 mg into the skin every Thursday. )   . furosemide (LASIX) 40 MG tablet Take 1 tablet (40 mg total) by mouth 2 (two) times daily. 07/02/2019: Pt taking differently: Take 1 tablet daily  . glimepiride (AMARYL) 1 MG tablet Take 1 tablet (1 mg total) by mouth daily with breakfast.   . glucose blood (ACCU-CHEK AVIVA PLUS) test strip 1 each by Other route as needed for other. Use as instructed once  DAILY DIAG -E11.9   . isosorbide mononitrate (IMDUR) 30 MG 24 hr tablet Take  1 tablet (30 mg total) by mouth daily.   Marland Kitchen losartan (COZAAR) 100 MG tablet Take one tab po qd (Patient taking differently: Take 100 mg by mouth daily. Take one tab po qd)   . montelukast (SINGULAIR) 10 MG tablet  Take 1 tablet (10 mg total) by mouth daily. (Patient taking differently: Take 10 mg by mouth at bedtime. )   . nitroGLYCERIN (NITROSTAT) 0.4 MG SL tablet Place 0.4 mg under the tongue every 5 (five) minutes x 3 doses as needed for chest pain. *If no relief call MD or go to Emergency Room*   . oxybutynin (DITROPAN) 5 MG tablet Take 1 tablet (5 mg total) by mouth daily after supper.   . ranolazine (RANEXA) 500 MG 12 hr tablet Take 500 mg by mouth 2 (two) times daily.   . sertraline (ZOLOFT) 100 MG tablet Take 1 tablet (100 mg total) by mouth daily. 02/20/2019: Not currently taking  . simvastatin (ZOCOR) 40 MG tablet Take 40 mg by mouth daily.   Marland Kitchen zolpidem (AMBIEN) 10 MG tablet Take 1 tablet (10 mg total) by mouth at bedtime.   Marland Kitchen oxyCODONE-acetaminophen (PERCOCET) 5-325 MG tablet Take 1-2 tablets by mouth every 8 (eight) hours as needed for severe pain.   . [DISCONTINUED] oxyCODONE-acetaminophen (PERCOCET) 5-325 MG tablet Take 1-2 tablets by mouth every 4 (four) hours as needed for severe pain.    No facility-administered encounter medications on file as of 07/12/2019.    Surgical History: Past Surgical History:  Procedure Laterality Date  . ABDOMINAL HYSTERECTOMY    . CARDIAC CATHETERIZATION    . CARDIAC CATHETERIZATION N/A 10/18/2015   Procedure: Left Heart Cath and Cors/Grafts Angiography;  Surgeon: Lorretta Harp, MD;  Location: [facility];  Service: Cardiovascular;  Laterality: N/A;  . CARDIAC CATHETERIZATION N/A 10/18/2015   Procedure: Coronary Stent Intervention;  Surgeon: Lorretta Harp, MD;  Location: [place] [facility];  Service: Cardiovascular;  Laterality: N/A;  . CARDIAC SURGERY    . CHOLECYSTECTOMY    . CORONARY ANGIOPLASTY    . CORONARY ARTERY BYPASS GRAFT     4 vessels - 2010  . CORONARY STENT INTERVENTION N/A 02/16/2017   Procedure: CORONARY STENT INTERVENTION;  Surgeon: Yolonda Kida, MD;  Location: [place] [facility];  Service: Cardiovascular;  Laterality:  N/A;  . CORONARY STENT INTERVENTION N/A 02/13/2019   Procedure: CORONARY STENT INTERVENTION;  Surgeon: Nelva Bush, MD;  Location: [place] [facility];  Service: Cardiovascular;  Laterality: N/A;  SVG to RCA  . CORONARY STENT INTERVENTION Left 03/08/2019   Procedure: CORONARY STENT INTERVENTION;  Surgeon: Yolonda Kida, MD;  Location: [place] [facility];  Service: Cardiovascular;  Laterality: Left;  . LEFT HEART CATH AND CORONARY ANGIOGRAPHY Left 02/16/2017   Procedure: LEFT HEART CATH AND CORONARY ANGIOGRAPHY;  Surgeon: Corey Skains, MD;  Location: [place] [facility];  Service: Cardiovascular;  Laterality: Left;  . LEFT HEART CATH AND CORS/GRAFTS ANGIOGRAPHY Left 11/23/2017   Procedure: LEFT HEART CATH AND CORS/GRAFTS ANGIOGRAPHY;  Surgeon: Corey Skains, MD;  Location: [place] [facility];  Service: Cardiovascular;  Laterality: Left;  . LEFT HEART CATH AND CORS/GRAFTS ANGIOGRAPHY N/A 02/13/2019   Procedure: LEFT HEART CATH AND CORS/GRAFTS ANGIOGRAPHY;  Surgeon: Corey Skains, MD;  Location: [place];  Service: Cardiovascular;  Laterality: N/A;  . LEFT HEART CATH AND CORS/GRAFTS ANGIOGRAPHY N/A 07/03/2019   Procedure: LEFT HEART CATH AND CORS/GRAFTS ANGIOGRAPHY;  Surgeon: Corey Skains, MD;  Location: [place] [facility];  Service: Cardiovascular;  Laterality: N/A;    Medical History: Past Medical History:  Diagnosis Date  . Asthma   . Coronary artery disease   . Diabetes mellitus without complication ([place])   . Heart attack ([place])   . Hyperlipidemia   . Hypertension   . MI (myocardial infarction) ([place])   . Migraine headache with aura   . 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    Family History: Family History  Problem Relation Age of Onset  . Diabetes Mother   . Diabetes Father   . Cancer Father   . Diabetes Brother     Social History   Socioeconomic History  . Marital status: Married    Spouse name: Jeneen Rinks  . Number of children: Not on  file  . Years of education: Not on file  . Highest education level: Not on file  Occupational History  . Not on file  Tobacco Use  . Smoking status: Never Smoker  . Smokeless tobacco: Never Used  Substance and Sexual Activity  . Alcohol use: No    Alcohol/week: 0.0 standard drinks  . Drug use: No  . Sexual activity: Yes  Other Topics Concern  . Not on file  Social History Narrative  . Not on file   Social Determinants of Health   Financial Resource Strain:   . Difficulty of Paying Living Expenses: Not on file  Food Insecurity:   . Worried About Charity fundraiser in the Last Year: Not on file  . Ran Out of Food in the Last Year: Not on file  Transportation Needs:   . Lack of Transportation (Medical): Not on file  . Lack of Transportation (Non-Medical): Not on file  Physical Activity:   . Days of Exercise per Week: Not on file  . Minutes of Exercise per Session: Not on file  Stress:   . Feeling of Stress : Not on file  Social Connections:   . Frequency of Communication with Friends and Family: Not on file  . Frequency of Social Gatherings with Friends and Family: Not on file  . Attends Religious Services: Not on file  . Active Member of Clubs or Organizations: Not on file  . Attends Archivist Meetings: Not on file  . Marital Status: Not on file  Intimate Partner Violence:   . Fear of Current or Ex-Partner: Not on file  . Emotionally Abused: Not on file  . Physically Abused: Not on file  . Sexually Abused: Not on file      Review of Systems  Constitutional: Negative for chills, fatigue and unexpected weight change.  HENT: Negative for congestion, rhinorrhea, sneezing and sore throat.   Eyes: Negative for photophobia, pain and redness.  Respiratory: Negative for cough, chest tightness and shortness of breath.   Cardiovascular: Negative for chest pain and palpitations.  Gastrointestinal: Negative for abdominal pain, constipation, diarrhea, nausea and  vomiting.  Endocrine: Negative.   Genitourinary: Negative for dysuria and frequency.  Musculoskeletal: Negative for arthralgias, back pain, joint swelling and neck pain.       Right groin pain s/p cardiac catheterization, pain is at catheter insertion site  Skin: Negative for rash.  Allergic/Immunologic: Negative.   Neurological: Negative for tremors and numbness.  Hematological: Negative for adenopathy. Does not bruise/bleed easily.  Psychiatric/Behavioral: Negative for behavioral problems and sleep disturbance. The patient is not nervous/anxious.     Vital Signs: BP (!) 149/89   Pulse 80  Temp (!) 97.3 F (36.3 C)   Resp 16   Ht _0  (1.702 m)   Wt 263 lb (119.3 kg)   SpO2 96%   BMI 41.19 kg/m    Physical Exam Vitals and nursing note reviewed.  Constitutional:      General: She is not in acute distress.    Appearance: She is well-developed. She is not diaphoretic.  HENT:     Head: Normocephalic and atraumatic.     Mouth/Throat:     Pharynx: No oropharyngeal exudate.  Eyes:     Pupils: Pupils are equal, round, and reactive to light.  Neck:     Thyroid: No thyromegaly.     Vascular: No JVD.     Trachea: No tracheal deviation.  Cardiovascular:     Rate and Rhythm: Normal rate and regular rhythm.     Heart sounds: Normal heart sounds. No murmur. No friction rub. No gallop.   Pulmonary:     Effort: Pulmonary effort is normal. No respiratory distress.     Breath sounds: Normal breath sounds. No wheezing or rales.  Chest:     Chest wall: No tenderness.  Abdominal:     Palpations: Abdomen is soft.     Tenderness: There is no abdominal tenderness. There is no guarding.  Musculoskeletal:        General: Normal range of motion.     Cervical back: Normal range of motion and neck supple.     Comments: Right groin, tender to palpation, no evidence of hematoma  Lymphadenopathy:     Cervical: No cervical adenopathy.  Skin:    General: Skin is warm and dry.   Neurological:     Mental Status: She is alert and oriented to person, place, and time.     Cranial Nerves: No cranial nerve deficit.  Psychiatric:        Behavior: Behavior normal.        Thought Content: Thought content normal.        Judgment: Judgment normal.    Assessment/Plan: 1. Status post non-ST elevation myocardial infarction (NSTEMI) Hospital admission 12/28-12/30 for NSTEMI. S/P cardiac catheterization, occlusion of ostial graft to diagonal artery was found to be the cause of NSTEMI from artery stenosis, did not undergo PCI or further stent placement. Per Dr. Nehemiah Massed, medical management is appropriate at this time. Dual antiplatelet therapy and high intensity cholesterol therapy along with cardiac rehab. Scheduled to follow up with Dr. Nehemiah Massed this afternoon in the office.  2. Acute postoperative pain of right groin Severe pain in right groin from insertion site of catheter following cardiac catheterization. Minimal supply provided to help with pain relief from procedural pain in right groin. Discussed harmful side effects and dangers of taking narcotics and driving or mixing them with further sedating medications as well as alcohol. Encouraged patient to avoid taking more acetaminophen while taking this medication. - oxyCODONE-acetaminophen (PERCOCET) 5-325 MG tablet; Take 1-2 tablets by mouth every 4 (four) hours as needed for severe pain.  Dispense: 15 tablet; Refill: 0  3. Essential hypertension Slightly elevated today, feels as though this is related to her groin pain as she is grimacing and tense with movement. Will continue to monitor and continue current medication therapy at this time.  4. Chronic diastolic CHF (congestive heart failure) ([place]) Euvolemic during hospital course. Stable on current therapy, continue to monitor.  5. Diabetes mellitus without complication ([place]) Stable on current therapy, continue to monitor.   General Counseling: Lianah verbalizes  understanding  of the findings of todays visit and agrees with plan of treatment. I have discussed any further diagnostic evaluation that may be needed or ordered today. We also reviewed her medications today. she has been encouraged to call the office with any questions or concerns that should arise related to todays visit.    Counseling: Diabetes Counseling:  1. Addition of ACE inh/ ARB'S for nephroprotection. Microalbumin is updated  2. Diabetic foot care, prevention of complications. Podiatry consult 3. Exercise and lose weight.  4. Diabetic eye examination, Diabetic eye exam is updated  5. Monitor blood sugar closlely. nutrition counseling.  6. Sign and symptoms of hypoglycemia including shaking sweating,confusion and headaches.    No orders of the defined types were placed in this encounter.     I have reviewed all medical records from hospital follow up including radiology reports and consults from other physicians. Appropriate follow up diagnostics will be scheduled as needed. Patient/ Family understands the plan of treatment.   Time spent 30 minutes.   Orson Gear AGNP-C Internal Medicine

## 2019-08-16 ENCOUNTER — Telehealth: Payer: Self-pay

## 2019-08-16 NOTE — Telephone Encounter (Signed)
Confirmed appointment on 08/20/2019 and screened for covid. kl[facility] 

## 2019-08-20 ENCOUNTER — Encounter: Payer: Self-pay | Admitting: Adult Health

## 2019-08-20 ENCOUNTER — Other Ambulatory Visit: Payer: Self-pay

## 2019-08-20 ENCOUNTER — Ambulatory Visit (INDEPENDENT_AMBULATORY_CARE_PROVIDER_SITE_OTHER): Payer: Medicare Other | Admitting: Adult Health

## 2019-08-20 VITALS — BP 125/75 | HR 80 | Temp 97.1°F | Resp 16 | Ht 67.0 in | Wt 258.0 lb

## 2019-08-20 DIAGNOSIS — E1165 Type 2 diabetes mellitus with hyperglycemia: Secondary | ICD-10-CM | POA: Diagnosis not present

## 2019-08-20 DIAGNOSIS — M542 Cervicalgia: Secondary | ICD-10-CM | POA: Diagnosis not present

## 2019-08-20 DIAGNOSIS — N3281 Overactive bladder: Secondary | ICD-10-CM

## 2019-08-20 DIAGNOSIS — Z955 Presence of coronary angioplasty implant and graft: Secondary | ICD-10-CM

## 2019-08-20 DIAGNOSIS — Z0001 Encounter for general adult medical examination with abnormal findings: Secondary | ICD-10-CM

## 2019-08-20 DIAGNOSIS — J302 Other seasonal allergic rhinitis: Secondary | ICD-10-CM

## 2019-08-20 DIAGNOSIS — I10 Essential (primary) hypertension: Secondary | ICD-10-CM | POA: Diagnosis not present

## 2019-08-20 DIAGNOSIS — G4709 Other insomnia: Secondary | ICD-10-CM | POA: Diagnosis not present

## 2019-08-20 LAB — POCT GLYCOSYLATED HEMOGLOBIN (HGB A1C): Hemoglobin A1C: 6.6 % — AB (ref 4.0–5.6)

## 2019-08-20 MED ORDER — CYCLOBENZAPRINE HCL 10 MG PO TABS: 10.0000 mg | ORAL_TABLET | Freq: Three times a day (TID) | ORAL | 0 refills | Status: DC | PRN

## 2019-08-20 MED ORDER — GLIMEPIRIDE 1 MG PO TABS: 1.0000 mg | ORAL_TABLET | Freq: Every day | ORAL | 2 refills | Status: DC

## 2019-08-20 MED ORDER — TRAMADOL HCL 50 MG PO TABS: 50.0000 mg | ORAL_TABLET | Freq: Three times a day (TID) | ORAL | 0 refills | Status: AC | PRN

## 2019-08-20 MED ORDER — ACCU-CHEK AVIVA PLUS VI STRP: 1.0000 | ORAL_STRIP | 3 refills | Status: DC | PRN

## 2019-08-20 MED ORDER — MONTELUKAST SODIUM 10 MG PO TABS: 10.0000 mg | ORAL_TABLET | Freq: Every day | ORAL | 1 refills | Status: DC

## 2019-08-20 MED ORDER — OXYBUTYNIN CHLORIDE 5 MG PO TABS: 5.0000 mg | ORAL_TABLET | Freq: Every day | ORAL | 1 refills | Status: DC

## 2019-08-20 MED ORDER — CLOPIDOGREL BISULFATE 75 MG PO TABS: 75.0000 mg | ORAL_TABLET | Freq: Every day | ORAL | 3 refills | Status: DC

## 2019-08-20 MED ORDER — ACCU-CHEK SOFTCLIX LANCETS MISC: 3 refills | Status: DC

## 2019-08-20 MED ORDER — ZOLPIDEM TARTRATE 10 MG PO TABS: 10.0000 mg | ORAL_TABLET | Freq: Every day | ORAL | 1 refills | Status: DC

## 2019-08-20 MED ORDER — BISOPROLOL-HYDROCHLOROTHIAZIDE 5-6.25 MG PO TABS: 1.0000 | ORAL_TABLET | Freq: Every day | ORAL | 2 refills | Status: DC

## 2019-08-20 MED ORDER — FUROSEMIDE 40 MG PO TABS: 40.0000 mg | ORAL_TABLET | Freq: Two times a day (BID) | ORAL | 1 refills | Status: DC

## 2019-08-20 MED ORDER — LOSARTAN POTASSIUM 100 MG PO TABS: 100.0000 mg | ORAL_TABLET | Freq: Every day | ORAL | 1 refills | Status: DC

## 2019-08-20 MED ORDER — AMLODIPINE BESYLATE 10 MG PO TABS: 10.0000 mg | ORAL_TABLET | Freq: Every day | ORAL | 1 refills | Status: DC

## 2019-08-20 NOTE — Progress Notes (Signed)
[facility] [place], [place] [address]  Internal MEDICINE  Office Visit Note  Patient Name: Felicia Woods  [id number]  [id number]  Date of Service: 08/20/2019  Chief Complaint  Patient presents with  . Medicare Wellness  . Diabetes  . Hyperlipidemia  . Hypertension     HPI Pt is here for routine health maintenance examination.  Pt is a well appearing 65 yo AA female. She has a history of DM, HTn, HLD and allergies. Overall she is doing well. She does report some ongoing pain in her knees.  She is seeing ortho next month.  She would like some tramadol to get her to that appt.  Her blood pressure is well controlled.  Her A1C is 6.6 today.   Current Medication: Outpatient Encounter Medications as of 08/20/2019  Medication Sig Note  . ACCU-CHEK SOFTCLIX LANCETS lancets Use as instructed once  DAILY DIAG -E11.9   . albuterol (PROVENTIL HFA;VENTOLIN HFA) 108 (90 Base) MCG/ACT inhaler Inhale 2 puffs into the lungs every 6 (six) hours as needed for wheezing or shortness of breath.   Marland Kitchen amLODipine (NORVASC) 10 MG tablet Take 1 tablet (10 mg total) by mouth daily.   Marland Kitchen aspirin EC 81 MG tablet Take 81 mg by mouth daily.   Marland Kitchen atorvastatin (LIPITOR) 80 MG tablet Take 80 mg by mouth daily.   . bisoprolol-hydrochlorothiazide (ZIAC) 5-6.25 MG tablet Take 1 tablet by mouth daily.   . clopidogrel (PLAVIX) 75 MG tablet Take 1 tablet (75 mg total) by mouth daily.   . Dulaglutide (TRULICITY) 1.5 QA/8.3MH SOPN Inject 1.5 mg into the skin once a week. (Patient taking differently: Inject 1.5 mg into the skin every Thursday. )   . furosemide (LASIX) 40 MG tablet Take 1 tablet (40 mg total) by mouth 2 (two) times daily. 07/02/2019: Pt taking differently: Take 1 tablet daily  . glimepiride (AMARYL) 1 MG tablet Take 1 tablet (1 mg total) by mouth daily with breakfast.   . glucose blood (ACCU-CHEK AVIVA PLUS) test strip 1 each by Other route as needed for other. Use as instructed once   DAILY DIAG -E11.9   . isosorbide mononitrate (IMDUR) 30 MG 24 hr tablet Take 1 tablet (30 mg total) by mouth daily.   Marland Kitchen losartan (COZAAR) 100 MG tablet Take one tab po qd (Patient taking differently: Take 100 mg by mouth daily. Take one tab po qd)   . montelukast (SINGULAIR) 10 MG tablet Take 1 tablet (10 mg total) by mouth daily. (Patient taking differently: Take 10 mg by mouth at bedtime. )   . nitroGLYCERIN (NITROSTAT) 0.4 MG SL tablet Place 0.4 mg under the tongue every 5 (five) minutes x 3 doses as needed for chest pain. *If no relief call MD or go to Emergency Room*   . oxybutynin (DITROPAN) 5 MG tablet Take 1 tablet (5 mg total) by mouth daily after supper.   Marland Kitchen oxyCODONE-acetaminophen (PERCOCET) 5-325 MG tablet Take 1-2 tablets by mouth every 8 (eight) hours as needed for severe pain.   . ranolazine (RANEXA) 500 MG 12 hr tablet Take 500 mg by mouth 2 (two) times daily.   . sertraline (ZOLOFT) 100 MG tablet Take 1 tablet (100 mg total) by mouth daily. 02/20/2019: Not currently taking  . simvastatin (ZOCOR) 40 MG tablet Take 40 mg by mouth daily.   Marland Kitchen zolpidem (AMBIEN) 10 MG tablet Take 1 tablet (10 mg total) by mouth at bedtime.    No facility-administered encounter medications on file  as of 08/20/2019.    Surgical History: Past Surgical History:  Procedure Laterality Date  . ABDOMINAL HYSTERECTOMY    . CARDIAC CATHETERIZATION    . CARDIAC CATHETERIZATION N/A 10/18/2015   Procedure: Left Heart Cath and Cors/Grafts Angiography;  Surgeon: Lorretta Harp, MD;  Location: [facility];  Service: Cardiovascular;  Laterality: N/A;  . CARDIAC CATHETERIZATION N/A 10/18/2015   Procedure: Coronary Stent Intervention;  Surgeon: Lorretta Harp, MD;  Location: [place];  Service: Cardiovascular;  Laterality: N/A;  . CARDIAC SURGERY    . CHOLECYSTECTOMY    . CORONARY ANGIOPLASTY    . CORONARY ARTERY BYPASS GRAFT     4 vessels - 2010  . CORONARY STENT INTERVENTION N/A 02/16/2017    Procedure: CORONARY STENT INTERVENTION;  Surgeon: Yolonda Kida, MD;  Location: [place] [facility];  Service: Cardiovascular;  Laterality: N/A;  . CORONARY STENT INTERVENTION N/A 02/13/2019   Procedure: CORONARY STENT INTERVENTION;  Surgeon: Nelva Bush, MD;  Location: [facility] [facility];  Service: Cardiovascular;  Laterality: N/A;  SVG to RCA  . CORONARY STENT INTERVENTION Left 03/08/2019   Procedure: CORONARY STENT INTERVENTION;  Surgeon: Yolonda Kida, MD;  Location: [place] [facility];  Service: Cardiovascular;  Laterality: Left;  . LEFT HEART CATH AND CORONARY ANGIOGRAPHY Left 02/16/2017   Procedure: LEFT HEART CATH AND CORONARY ANGIOGRAPHY;  Surgeon: Corey Skains, MD;  Location: [facility];  Service: Cardiovascular;  Laterality: Left;  . LEFT HEART CATH AND CORS/GRAFTS ANGIOGRAPHY Left 11/23/2017   Procedure: LEFT HEART CATH AND CORS/GRAFTS ANGIOGRAPHY;  Surgeon: Corey Skains, MD;  Location: [facility];  Service: Cardiovascular;  Laterality: Left;  . LEFT HEART CATH AND CORS/GRAFTS ANGIOGRAPHY N/A 02/13/2019   Procedure: LEFT HEART CATH AND CORS/GRAFTS ANGIOGRAPHY;  Surgeon: Corey Skains, MD;  Location: [place] [facility];  Service: Cardiovascular;  Laterality: N/A;  . LEFT HEART CATH AND CORS/GRAFTS ANGIOGRAPHY N/A 07/03/2019   Procedure: LEFT HEART CATH AND CORS/GRAFTS ANGIOGRAPHY;  Surgeon: Corey Skains, MD;  Location: [place] [facility];  Service: Cardiovascular;  Laterality: N/A;    Medical History: Past Medical History:  Diagnosis Date  . Asthma   . Coronary artery disease   . Diabetes mellitus without complication ([place])   . Heart attack ([place])   . Hyperlipidemia   . Hypertension   . MI (myocardial infarction) ([place])   . Migraine headache with aura   . 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    Family History: Family History  Problem Relation Age of Onset  . Diabetes Mother   . Diabetes Father   . Cancer Father   .  Diabetes Brother       Review of Systems  Constitutional: Negative for chills, fatigue and unexpected weight change.  HENT: Negative for congestion, rhinorrhea, sneezing and sore throat.   Eyes: Negative for photophobia, pain and redness.  Respiratory: Negative for cough, chest tightness and shortness of breath.   Cardiovascular: Negative for chest pain and palpitations.  Gastrointestinal: Negative for abdominal pain, constipation, diarrhea, nausea and vomiting.  Endocrine: Negative.   Genitourinary: Negative for dysuria and frequency.  Musculoskeletal: Negative for arthralgias, back pain, joint swelling and neck pain.  Skin: Negative for rash.  Allergic/Immunologic: Negative.   Neurological: Negative for tremors and numbness.  Hematological: Negative for adenopathy. Does not bruise/bleed easily.  Psychiatric/Behavioral: Negative for behavioral problems and sleep disturbance. The patient is not nervous/anxious.  Vital Signs: BP 125/75   Pulse 80   Temp (!) 97.1 F (36.2 C)   Resp 16   Ht _0  (1.702 m)   Wt 258 lb (117 kg)   SpO2 96%   BMI 40.41 kg/m    Physical Exam Vitals and nursing note reviewed.  Constitutional:      General: She is not in acute distress.    Appearance: She is well-developed. She is not diaphoretic.  HENT:     Head: Normocephalic and atraumatic.     Mouth/Throat:     Pharynx: No oropharyngeal exudate.  Eyes:     Pupils: Pupils are equal, round, and reactive to light.  Neck:     Thyroid: No thyromegaly.     Vascular: No JVD.     Trachea: No tracheal deviation.  Cardiovascular:     Rate and Rhythm: Normal rate and regular rhythm.     Heart sounds: Normal heart sounds. No murmur. No friction rub. No gallop.   Pulmonary:     Effort: Pulmonary effort is normal. No respiratory distress.     Breath sounds: Normal breath sounds. No wheezing or rales.  Chest:     Chest wall: No tenderness.  Abdominal:     Palpations: Abdomen is soft.      Tenderness: There is no abdominal tenderness. There is no guarding.  Musculoskeletal:        General: Normal range of motion.     Cervical back: Normal range of motion and neck supple.  Lymphadenopathy:     Cervical: No cervical adenopathy.  Skin:    General: Skin is warm and dry.  Neurological:     Mental Status: She is alert and oriented to person, place, and time.     Cranial Nerves: No cranial nerve deficit.  Psychiatric:        Behavior: Behavior normal.        Thought Content: Thought content normal.        Judgment: Judgment normal.      LABS: Recent Results (from the past 2160 hour(s))  Basic metabolic panel     Status: Abnormal   Collection Time: 05/30/19  8:06 PM  Result Value Ref Range   Sodium 140 135 - 145 mmol/L   Potassium 3.2 (L) 3.5 - 5.1 mmol/L   Chloride 104 98 - 111 mmol/L   CO2 26 22 - 32 mmol/L   Glucose, Bld 175 (H) 70 - 99 mg/dL   BUN 13 8 - 23 mg/dL   Creatinine, Ser 0.77 0.44 - 1.00 mg/dL   Calcium 9.1 8.9 - 10.3 mg/dL   GFR calc non Af Amer >60 >60 mL/min   GFR calc Af Amer >60 >60 mL/min   Anion gap 10 5 - 15    Comment: Performed at [facility], [address] [place], [place] [address]  CBC with Differential     Status: Abnormal   Collection Time: 05/30/19  8:06 PM  Result Value Ref Range   WBC 9.5 4.0 - 10.5 K/uL   RBC 4.95 3.87 - 5.11 MIL/uL   Hemoglobin 12.6 12.0 - 15.0 g/dL   HCT 39.0 36.0 - 46.0 %   MCV 78.8 (L) 80.0 - 100.0 fL   MCH 25.5 (L) 26.0 - 34.0 pg   MCHC 32.3 30.0 - 36.0 g/dL   RDW 16.4 (H) 11.5 - 15.5 %   Platelets 238 150 - 400 K/uL   nRBC 0.0 0.0 - 0.2 %   Neutrophils Relative %  60 %   Neutro Abs 5.7 1.7 - 7.7 K/uL   Lymphocytes Relative 32 %   Lymphs Abs 3.1 0.7 - 4.0 K/uL   Monocytes Relative 6 %   Monocytes Absolute 0.6 0.1 - 1.0 K/uL   Eosinophils Relative 2 %   Eosinophils Absolute 0.2 0.0 - 0.5 K/uL   Basophils Relative 0 %   Basophils Absolute 0.0 0.0 - 0.1 K/uL   Immature Granulocytes 0 %    Abs Immature Granulocytes 0.02 0.00 - 0.07 K/uL    Comment: Performed at [facility], [address], [place], [place] [address]  Troponin I (High Sensitivity)     Status: None   Collection Time: 05/30/19  8:06 PM  Result Value Ref Range   Troponin I (High Sensitivity) 5 <18 ng/L    Comment: (NOTE) Elevated high sensitivity troponin I (hsTnI) values and significant  changes across serial measurements may suggest ACS but many other  chronic and acute conditions are known to elevate hsTnI results.  Refer to the "Links" section for chest pain algorithms and additional  guidance. Performed at [facility], [address] [place], [place] [address]   Fibrin derivatives D-Dimer [facility] only)     Status: Abnormal   Collection Time: 05/30/19  8:06 PM  Result Value Ref Range   Fibrin derivatives D-dimer ([facility]) 635.04 (H) 0.00 - 499.00 ng/mL (FEU)    Comment: (NOTE) <> Exclusion of Venous Thromboembolism (VTE) - OUTPATIENT ONLY   (Emergency Department or [place])   0-499 ng/ml (FEU): With a low to intermediate pretest probability                      for VTE this test result excludes the diagnosis                      of VTE.   >499 ng/ml (FEU) : VTE not excluded; additional work up for VTE is                      required. <> Testing on Inpatients and Evaluation of Disseminated Intravascular   Coagulation (DIC) Reference Range:   0-499 ng/ml (FEU) Performed at [facility], [address] [place], [place] [address]   Magnesium     Status: None   Collection Time: 05/30/19  8:06 PM  Result Value Ref Range   Magnesium 1.9 1.7 - 2.4 mg/dL    Comment: Performed at [facility], [place], [place] [address]  SARS CORONAVIRUS 2 (TAT 6-24 HRS) Nasopharyngeal Nasopharyngeal Swab     Status: None   Collection Time: 05/30/19  9:36 PM   Specimen: Nasopharyngeal Swab  Result Value Ref Range   SARS Coronavirus 2 NEGATIVE NEGATIVE    Comment:  (NOTE) SARS-CoV-2 target nucleic acids are NOT DETECTED. The SARS-CoV-2 RNA is generally detectable in upper and lower respiratory specimens during the acute phase of infection. Negative results do not preclude SARS-CoV-2 infection, do not rule out co-infections with other pathogens, and should not be used as the sole basis for treatment or other patient management decisions. Negative results must be combined with clinical observations, patient history, and epidemiological information. The expected result is Negative. Fact Sheet for Patients: SugarRoll.be Fact Sheet for Healthcare Providers: https://www.woods-mathews.com/ This test is not yet approved or cleared by the [place] FDA and  has been authorized for detection and/or diagnosis of SARS-CoV-2 by FDA under an Emergency Use Authorization (  EUA). This EUA will remain  in effect (meaning this test can be used) for the duration of the COVID-19 declaration under Section 56 4(b)(1) of the Act, 21 U.S.C. section 360bbb-3(b)(1), unless the authorization is terminated or revoked sooner. Performed at [facility], [place] [address] [place], [place] [address]   Troponin I (High Sensitivity)     Status: None   Collection Time: 05/30/19  9:49 PM  Result Value Ref Range   Troponin I (High Sensitivity) 6 <18 ng/L    Comment: (NOTE) Elevated high sensitivity troponin I (hsTnI) values and significant  changes across serial measurements may suggest ACS but many other  chronic and acute conditions are known to elevate hsTnI results.  Refer to the "Links" section for chest pain algorithms and additional  guidance. Performed at [facility], [place], [place] [address]   Troponin I (High Sensitivity)     Status: None   Collection Time: 05/30/19 11:59 PM  Result Value Ref Range   Troponin I (High Sensitivity) 6 <18 ng/L    Comment: (NOTE) Elevated high sensitivity  troponin I (hsTnI) values and significant  changes across serial measurements may suggest ACS but many other  chronic and acute conditions are known to elevate hsTnI results.  Refer to the "Links" section for chest pain algorithms and additional  guidance. Performed at [facility], [address] [place], [place] [address]   HIV Antibody (routine testing w rflx)     Status: Abnormal   Collection Time: 05/31/19  1:56 AM  Result Value Ref Range   HIV Screen 4th Generation wRfx Reactive (A) NON REACTIVE    Comment: (NOTE) Reactive result does not distinguish HIV-1 p24 antigen, HIV-1  antibody, HIV-2 antibody, and HIV-1 group O antibody. Results  reactive by HIV Antigen/Antibody EIA must be confirmed. Sent for  confirmation. Performed at [facility], [place] [address] [place], [place] [address]   Magnesium     Status: None   Collection Time: 05/31/19  1:56 AM  Result Value Ref Range   Magnesium 1.8 1.7 - 2.4 mg/dL    Comment: Performed at [facility] [address] [place], [place] [address]  Phosphorus     Status: None   Collection Time: 05/31/19  1:56 AM  Result Value Ref Range   Phosphorus 3.5 2.5 - 4.6 mg/dL    Comment: Performed at [facility], [address] [place], [place] [address]  TSH     Status: Abnormal   Collection Time: 05/31/19  1:56 AM  Result Value Ref Range   TSH 5.421 (H) 0.350 - 4.500 uIU/mL    Comment: Performed by a 3rd Generation assay with a functional sensitivity of <=0.01 uIU/mL. Performed at [facility], [address] [place], [place] [address]   Comprehensive metabolic panel     Status: Abnormal   Collection Time: 05/31/19  1:56 AM  Result Value Ref Range   Sodium 140 135 - 145 mmol/L   Potassium 3.1 (L) 3.5 - 5.1 mmol/L   Chloride 105 98 - 111 mmol/L   CO2 25 22 - 32 mmol/L   Glucose, Bld 210 (H) 70 - 99 mg/dL   BUN 13 8 - 23 mg/dL   Creatinine, Ser 0.69 0.44 - 1.00 mg/dL   Calcium 8.8 (L)  8.9 - 10.3 mg/dL   Total Protein 7.0 6.5 - 8.1 g/dL   Albumin 3.4 (L) 3.5 - 5.0 g/dL   AST 20 15 -  41 U/L   ALT 12 0 - 44 U/L   Alkaline Phosphatase 54 38 - 126 U/L   Total Bilirubin 0.5 0.3 - 1.2 mg/dL   GFR calc non Af Amer >60 >60 mL/min   GFR calc Af Amer >60 >60 mL/min   Anion gap 10 5 - 15    Comment: Performed at [facility], [address] [place], [place] [address]  CBC     Status: Abnormal   Collection Time: 05/31/19  1:56 AM  Result Value Ref Range   WBC 7.5 4.0 - 10.5 K/uL   RBC 4.51 3.87 - 5.11 MIL/uL   Hemoglobin 11.3 (L) 12.0 - 15.0 g/dL   HCT 36.0 36.0 - 46.0 %   MCV 79.8 (L) 80.0 - 100.0 fL   MCH 25.1 (L) 26.0 - 34.0 pg   MCHC 31.4 30.0 - 36.0 g/dL   RDW 16.4 (H) 11.5 - 15.5 %   Platelets 217 150 - 400 K/uL   nRBC 0.0 0.0 - 0.2 %    Comment: Performed at [facility], [address] [place], [place] [address]  Hemoglobin A1c     Status: Abnormal   Collection Time: 05/31/19  1:56 AM  Result Value Ref Range   Hgb A1c MFr Bld 7.3 (H) 4.8 - 5.6 %    Comment: (NOTE) Pre diabetes:          5.7%-6.4% Diabetes:              >6.4% Glycemic control for   <7.0% adults with diabetes    Mean Plasma Glucose 162.81 mg/dL    Comment: Performed at [facility], [place] [address], [place], [place] [address]  Troponin I (High Sensitivity)     Status: None   Collection Time: 05/31/19  1:56 AM  Result Value Ref Range   Troponin I (High Sensitivity) 6 <18 ng/L    Comment: (NOTE) Elevated high sensitivity troponin I (hsTnI) values and significant  changes across serial measurements may suggest ACS but many other  chronic and acute conditions are known to elevate hsTnI results.  Refer to the "Links" section for chest pain algorithms and additional  guidance. Performed at [facility], [address] [place], [place] [address]   Panel [id number]     Status: None   Collection Time: 05/31/19  1:56 AM  Result Value Ref Range   HIV 1 AB  Negative Negative   HIV 2 AB Negative Negative   Note Negative     Comment: (NOTE) Recommend follow-up testing for HIV-1 RNA. Performed At: [facility] [address] [place], [place] [id number] Rush Farmer MD CZ:[id number]   Glucose, capillary     Status: Abnormal   Collection Time: 05/31/19  5:30 AM  Result Value Ref Range   Glucose-Capillary 102 (H) 70 - 99 mg/dL  ECHOCARDIOGRAM COMPLETE     Status: None   Collection Time: 05/31/19  8:19 AM  Result Value Ref Range   Weight 4,068.8 oz   Height 67 in   BP 125/93 mmHg  Glucose, capillary     Status: Abnormal   Collection Time: 05/31/19  8:21 AM  Result Value Ref Range   Glucose-Capillary 109 (H) 70 - 99 mg/dL  Glucose, capillary     Status: Abnormal   Collection Time: 05/31/19 11:57 AM  Result Value Ref Range   Glucose-Capillary 104 (H) 70 - 99 mg/dL  NM Myocar Multi W/Spect W/Wall Motion / EF     Status:  None   Collection Time: 06/21/19 10:45 AM  Result Value Ref Range   SSS 8    SRS 1    SDS 7    TID 1.08    LV sys vol 34 mL   LV dias vol 76 46 - 106 mL  Basic metabolic panel     Status: Abnormal   Collection Time: 07/02/19  8:35 PM  Result Value Ref Range   Sodium 136 135 - 145 mmol/L   Potassium 3.6 3.5 - 5.1 mmol/L   Chloride 100 98 - 111 mmol/L   CO2 23 22 - 32 mmol/L   Glucose, Bld 185 (H) 70 - 99 mg/dL   BUN 11 8 - 23 mg/dL   Creatinine, Ser 0.71 0.44 - 1.00 mg/dL   Calcium 9.4 8.9 - 10.3 mg/dL   GFR calc non Af Amer >60 >60 mL/min   GFR calc Af Amer >60 >60 mL/min   Anion gap 13 5 - 15    Comment: Performed at [facility], [address] [place], [place] [address]  CBC     Status: Abnormal   Collection Time: 07/02/19  8:35 PM  Result Value Ref Range   WBC 10.1 4.0 - 10.5 K/uL   RBC 5.18 (H) 3.87 - 5.11 MIL/uL   Hemoglobin 13.1 12.0 - 15.0 g/dL   HCT 39.1 36.0 - 46.0 %   MCV 75.5 (L) 80.0 - 100.0 fL   MCH 25.3 (L) 26.0 - 34.0 pg   MCHC 33.5 30.0 - 36.0 g/dL   RDW 16.7 (H)  11.5 - 15.5 %   Platelets 217 150 - 400 K/uL   nRBC 0.0 0.0 - 0.2 %    Comment: Performed at [facility], [address] [place], [place] [address]  Troponin I (High Sensitivity)     Status: Abnormal   Collection Time: 07/02/19  8:35 PM  Result Value Ref Range   Troponin I (High Sensitivity) 750 (HH) <18 ng/L    Comment: CRITICAL RESULT CALLED TO, READ BACK BY AND VERIFIED WITH LISA THOMPSON AT 2118 ON 07/02/19 RWW (NOTE) Elevated high sensitivity troponin I (hsTnI) values and significant  changes across serial measurements may suggest ACS but many other  chronic and acute conditions are known to elevate hsTnI results.  Refer to the "Links" section for chest pain algorithms and additional  guidance. Performed at [facility], [address] [place], [place] [address]   Brain natriuretic peptide     Status: Abnormal   Collection Time: 07/02/19  8:35 PM  Result Value Ref Range   B Natriuretic Peptide 126.0 (H) 0.0 - 100.0 pg/mL    Comment: Performed at [facility], [address] [place], [place] [address]  Respiratory Panel by RT PCR (Flu A&B, Covid) - Nasopharyngeal Swab     Status: None   Collection Time: 07/02/19 10:22 PM   Specimen: Nasopharyngeal Swab  Result Value Ref Range   SARS Coronavirus 2 by RT PCR NEGATIVE NEGATIVE    Comment: (NOTE) SARS-CoV-2 target nucleic acids are NOT DETECTED. The SARS-CoV-2 RNA is generally detectable in upper respiratoy specimens during the acute phase of infection. The lowest concentration of SARS-CoV-2 viral copies this assay can detect is 131 copies/mL. A negative result does not preclude SARS-Cov-2 infection and should not be used as the sole basis for treatment or other patient management decisions. A negative result may occur with  improper specimen collection/handling, submission of specimen other than nasopharyngeal swab, presence of viral  mutation(s) within the areas targeted by this assay, and  inadequate number of viral copies (<131 copies/mL). A negative result must be combined with clinical observations, patient history, and epidemiological information. The expected result is Negative. Fact Sheet for Patients:  PinkCheek.be Fact Sheet for Healthcare Providers:  GravelBags.it This test is not yet ap proved or cleared by the [place] FDA and  has been authorized for detection and/or diagnosis of SARS-CoV-2 by FDA under an Emergency Use Authorization (EUA). This EUA will remain  in effect (meaning this test can be used) for the duration of the COVID-19 declaration under Section 564(b)(1) of the Act, 21 U.S.C. section 360bbb-3(b)(1), unless the authorization is terminated or revoked sooner.    Influenza A by PCR NEGATIVE NEGATIVE   Influenza B by PCR NEGATIVE NEGATIVE    Comment: (NOTE) The Xpert Xpress SARS-CoV-2/FLU/RSV assay is intended as an aid in  the diagnosis of influenza from Nasopharyngeal swab specimens and  should not be used as a sole basis for treatment. Nasal washings and  aspirates are unacceptable for Xpert Xpress SARS-CoV-2/FLU/RSV  testing. Fact Sheet for Patients: PinkCheek.be Fact Sheet for Healthcare Providers: GravelBags.it This test is not yet approved or cleared by the [place] FDA and  has been authorized for detection and/or diagnosis of SARS-CoV-2 by  FDA under an Emergency Use Authorization (EUA). This EUA will remain  in effect (meaning this test can be used) for the duration of the  Covid-19 declaration under Section 564(b)(1) of the Act, 21  U.S.C. section 360bbb-3(b)(1), unless the authorization is  terminated or revoked. Performed at [facility], [address] [place], [place] [address]   Troponin I (High Sensitivity)     Status: Abnormal   Collection Time: 07/02/19 11:29 PM  Result Value Ref Range    Troponin I (High Sensitivity) 2,799 (HH) <18 ng/L    Comment: CRITICAL VALUE NOTED. VALUE IS CONSISTENT WITH PREVIOUSLY REPORTED/CALLED VALUE RWW (NOTE) Elevated high sensitivity troponin I (hsTnI) values and significant  changes across serial measurements may suggest ACS but many other  chronic and acute conditions are known to elevate hsTnI results.  Refer to the "Links" section for chest pain algorithms and additional  guidance. Performed at [facility], [address] [place], [place] [address]   Protime-INR     Status: None   Collection Time: 07/02/19 11:29 PM  Result Value Ref Range   Prothrombin Time 13.8 11.4 - 15.2 seconds   INR 1.1 0.8 - 1.2    Comment: (NOTE) INR goal varies based on device and disease states. Performed at [facility], [address] [place], [place] [address]   APTT     Status: Abnormal   Collection Time: 07/02/19 11:29 PM  Result Value Ref Range   aPTT 151 (H) 24 - 36 seconds    Comment:        IF BASELINE aPTT IS ELEVATED, SUGGEST PATIENT RISK ASSESSMENT BE USED TO DETERMINE APPROPRIATE ANTICOAGULANT THERAPY. Performed at [facility], [place], [place] [address]   Heparin level (unfractionated)     Status: None   Collection Time: 07/03/19  5:00 AM  Result Value Ref Range   Heparin Unfractionated 0.54 0.30 - 0.70 IU/mL    Comment: (NOTE) If heparin results are below expected values, and patient dosage has  been confirmed, suggest follow up testing of antithrombin III levels. Performed at [facility], [address] [place], [place] [address]   CBC  Status: Abnormal   Collection Time: 07/03/19  5:00 AM  Result Value Ref Range   WBC 8.8 4.0 - 10.5 K/uL   RBC 5.17 (H) 3.87 - 5.11 MIL/uL   Hemoglobin 12.8 12.0 - 15.0 g/dL   HCT 40.3 36.0 - 46.0 %   MCV 77.9 (L) 80.0 - 100.0 fL   MCH 24.8 (L) 26.0 - 34.0 pg   MCHC 31.8 30.0 - 36.0 g/dL   RDW 16.4 (H) 11.5 - 15.5 %    Platelets 227 150 - 400 K/uL   nRBC 0.0 0.0 - 0.2 %    Comment: Performed at [facility], [address] [place], [place] [address]  Glucose, capillary     Status: Abnormal   Collection Time: 07/03/19  8:21 AM  Result Value Ref Range   Glucose-Capillary 218 (H) 70 - 99 mg/dL  Heparin level (unfractionated)     Status: None   Collection Time: 07/03/19 12:53 PM  Result Value Ref Range   Heparin Unfractionated 0.37 0.30 - 0.70 IU/mL    Comment: (NOTE) If heparin results are below expected values, and patient dosage has  been confirmed, suggest follow up testing of antithrombin III levels. Performed at [facility], [address] [place], [place] [address]   Glucose, capillary     Status: Abnormal   Collection Time: 07/03/19  1:02 PM  Result Value Ref Range   Glucose-Capillary 106 (H) 70 - 99 mg/dL  Glucose, capillary     Status: Abnormal   Collection Time: 07/03/19  2:15 PM  Result Value Ref Range   Glucose-Capillary 106 (H) 70 - 99 mg/dL  Glucose, capillary     Status: Abnormal   Collection Time: 07/03/19  5:43 PM  Result Value Ref Range   Glucose-Capillary 208 (H) 70 - 99 mg/dL   Comment 1 Notify RN    Comment 2 Document in Chart   Glucose, capillary     Status: Abnormal   Collection Time: 07/03/19  9:06 PM  Result Value Ref Range   Glucose-Capillary 146 (H) 70 - 99 mg/dL  CBC     Status: Abnormal   Collection Time: 07/04/19  5:17 AM  Result Value Ref Range   WBC 8.4 4.0 - 10.5 K/uL   RBC 4.58 3.87 - 5.11 MIL/uL   Hemoglobin 11.8 (L) 12.0 - 15.0 g/dL   HCT 34.8 (L) 36.0 - 46.0 %   MCV 76.0 (L) 80.0 - 100.0 fL   MCH 25.8 (L) 26.0 - 34.0 pg   MCHC 33.9 30.0 - 36.0 g/dL   RDW 16.9 (H) 11.5 - 15.5 %   Platelets 217 150 - 400 K/uL   nRBC 0.0 0.0 - 0.2 %    Comment: Performed at [facility], [address] [place],  [address]  AM Labs bmp     Status: Abnormal   Collection Time: 07/04/19  5:17 AM  Result Value Ref Range   Sodium  136 135 - 145 mmol/L   Potassium 4.0 3.5 - 5.1 mmol/L   Chloride 101 98 - 111 mmol/L   CO2 25 22 - 32 mmol/L   Glucose, Bld 145 (H) 70 - 99 mg/dL   BUN 14 8 - 23 mg/dL   Creatinine, Ser 0.94 0.44 - 1.00 mg/dL   Calcium 8.7 (L) 8.9 - 10.3 mg/dL   GFR calc non Af Amer >60 >60 mL/min   GFR calc Af Amer >60 >60 mL/min   Anion gap 10 5 - 15  Comment: Performed at [facility], [address] [place], [place] [address]  Glucose, capillary     Status: Abnormal   Collection Time: 07/04/19  7:38 AM  Result Value Ref Range   Glucose-Capillary 145 (H) 70 - 99 mg/dL  Glucose, capillary     Status: Abnormal   Collection Time: 07/04/19 11:26 AM  Result Value Ref Range   Glucose-Capillary 245 (H) 70 - 99 mg/dL  POCT HgB A1C     Status: Abnormal   Collection Time: 08/20/19  2:37 PM  Result Value Ref Range   Hemoglobin A1C 6.6 (A) 4.0 - 5.6 %   HbA1c POC (<> result, manual entry)     HbA1c, POC (prediabetic range)     HbA1c, POC (controlled diabetic range)      Assessment/Plan: 1. Encounter for general adult medical examination with abnormal findings She is going to  - CBC with Differential/Platelet - Lipid Panel With LDL/HDL Ratio - TSH - T4, free - Comprehensive metabolic panel  2. Uncontrolled type 2 diabetes mellitus with hyperglycemia (HCC) A1C 6.6, continue current management.  - POCT HgB A1C - glimepiride (AMARYL) 1 MG tablet; Take 1 tablet (1 mg total) by mouth daily with breakfast.  Dispense: 90 tablet; Refill: 2 - glucose blood (ACCU-CHEK AVIVA PLUS) test strip; 1 each by Other route as needed for other. Use as instructed once  DAILY DIAG -E11.9  Dispense: 100 each; Refill: 3 - Accu-Chek Softclix Lancets lancets; Use as instructed once  DAILY DIAG -E11.9  Dispense: 100 each; Refill: 3 - CBC with Differential/Platelet  3. Essential hypertension Stable, continue present managment - furosemide (LASIX) 40 MG tablet; Take 1 tablet (40 mg total) by mouth 2 (two) times  daily.  Dispense: 180 tablet; Refill: 1 - bisoprolol-hydrochlorothiazide (ZIAC) 5-6.25 MG tablet; Take 1 tablet by mouth daily.  Dispense: 90 tablet; Refill: 2 - amLODipine (NORVASC) 10 MG tablet; Take 1 tablet (10 mg total) by mouth daily.  Dispense: 90 tablet; Refill: 1 - losartan (COZAAR) 100 MG tablet; Take 1 tablet (100 mg total) by mouth daily. Take one tab po qd  Dispense: 90 tablet; Refill: 1  4. S/P drug eluting coronary stent placement Continue plavix. - clopidogrel (PLAVIX) 75 MG tablet; Take 1 tablet (75 mg total) by mouth daily.  Dispense: 90 tablet; Refill: 3  5. Other insomnia Continue ambien - zolpidem (AMBIEN) 10 MG tablet; Take 1 tablet (10 mg total) by mouth at bedtime.  Dispense: 90 tablet; Refill: 1  6. Seasonal allergies Continue Montelukast as directed.  - montelukast (SINGULAIR) 10 MG tablet; Take 1 tablet (10 mg total) by mouth at bedtime.  Dispense: 90 tablet; Refill: 1  7. OAB (overactive bladder) - oxybutynin (DITROPAN) 5 MG tablet; Take 1 tablet (5 mg total) by mouth daily after supper.  Dispense: 90 tablet; Refill: 1  8. Neck pain Continue to use flexeril as directed.  - cyclobenzaprine (FLEXERIL) 10 MG tablet; Take 1 tablet (10 mg total) by mouth 3 (three) times daily as needed for muscle spasms.  Dispense: 30 tablet; Refill: 0  General Counseling: Bertha verbalizes understanding of the findings of todays visit and agrees with plan of treatment. I have discussed any further diagnostic evaluation that may be needed or ordered today. We also reviewed her medications today. she has been encouraged to call the office with any questions or concerns that should arise related to todays visit.   Orders Placed This Encounter  Procedures  . POCT HgB A1C    No  orders of the defined types were placed in this encounter.   Time spent: 30 Minutes   This patient was seen by Orson Gear AGNP-C in Collaboration with Dr Lavera Guise as a part of collaborative care  agreement    Kendell Bane AGNP-C Internal Medicine

## 2019-09-17 DIAGNOSIS — E1165 Type 2 diabetes mellitus with hyperglycemia: Secondary | ICD-10-CM | POA: Diagnosis not present

## 2019-09-17 DIAGNOSIS — Z0001 Encounter for general adult medical examination with abnormal findings: Secondary | ICD-10-CM | POA: Diagnosis not present

## 2019-09-18 DIAGNOSIS — M545 Low back pain: Secondary | ICD-10-CM | POA: Diagnosis not present

## 2019-09-18 DIAGNOSIS — M5416 Radiculopathy, lumbar region: Secondary | ICD-10-CM | POA: Diagnosis not present

## 2019-09-18 DIAGNOSIS — M1712 Unilateral primary osteoarthritis, left knee: Secondary | ICD-10-CM | POA: Diagnosis not present

## 2019-09-18 DIAGNOSIS — M7061 Trochanteric bursitis, right hip: Secondary | ICD-10-CM | POA: Diagnosis not present

## 2019-09-18 DIAGNOSIS — M170 Bilateral primary osteoarthritis of knee: Secondary | ICD-10-CM | POA: Diagnosis not present

## 2019-09-18 DIAGNOSIS — M1711 Unilateral primary osteoarthritis, right knee: Secondary | ICD-10-CM | POA: Diagnosis not present

## 2019-09-18 LAB — COMPREHENSIVE METABOLIC PANEL
ALT: 13 IU/L (ref 0–32)
AST: 20 IU/L (ref 0–40)
Albumin/Globulin Ratio: 1.5 (ref 1.2–2.2)
Albumin: 4.4 g/dL (ref 3.8–4.8)
Alkaline Phosphatase: 77 IU/L (ref 39–117)
BUN/Creatinine Ratio: 21 (ref 12–28)
BUN: 15 mg/dL (ref 8–27)
Bilirubin Total: <0.2 mg/dL (ref 0.0–1.2)
CO2: 23 mmol/L (ref 20–29)
Calcium: 9.7 mg/dL (ref 8.7–10.3)
Chloride: 102 mmol/L (ref 96–106)
Creatinine, Ser: 0.71 mg/dL (ref 0.57–1.00)
GFR calc Af Amer: 106 mL/min/{1.73_m2} (ref >59)
GFR calc non Af Amer: 92 mL/min/{1.73_m2} (ref >59)
Globulin, Total: 3 g/dL (ref 1.5–4.5)
Glucose: 102 mg/dL — ABNORMAL HIGH (ref 65–99)
Potassium: 4.2 mmol/L (ref 3.5–5.2)
Sodium: 139 mmol/L (ref 134–144)
Total Protein: 7.4 g/dL (ref 6.0–8.5)

## 2019-09-18 LAB — CBC WITH DIFFERENTIAL/PLATELET
Basophils Absolute: 0 10*3/uL (ref 0.0–0.2)
Basos: 0 %
EOS (ABSOLUTE): 0.1 10*3/uL (ref 0.0–0.4)
Eos: 1 %
Hematocrit: 40.6 % (ref 34.0–46.6)
Hemoglobin: 13.1 g/dL (ref 11.1–15.9)
Immature Grans (Abs): 0 10*3/uL (ref 0.0–0.1)
Immature Granulocytes: 0 %
Lymphocytes Absolute: 2.4 10*3/uL (ref 0.7–3.1)
Lymphs: 32 %
MCH: 25.2 pg — ABNORMAL LOW (ref 26.6–33.0)
MCHC: 32.3 g/dL (ref 31.5–35.7)
MCV: 78 fL — ABNORMAL LOW (ref 79–97)
Monocytes Absolute: 0.4 10*3/uL (ref 0.1–0.9)
Monocytes: 5 %
Neutrophils Absolute: 4.7 10*3/uL (ref 1.4–7.0)
Neutrophils: 62 %
Platelets: 219 10*3/uL (ref 150–450)
RBC: 5.19 x10E6/uL (ref 3.77–5.28)
RDW: 15 % (ref 11.7–15.4)
WBC: 7.6 10*3/uL (ref 3.4–10.8)

## 2019-09-18 LAB — T4, FREE: Free T4: 0.99 ng/dL (ref 0.82–1.77)

## 2019-09-18 LAB — LIPID PANEL WITH LDL/HDL RATIO
Cholesterol, Total: 188 mg/dL (ref 100–199)
HDL: 38 mg/dL — ABNORMAL LOW (ref >39)
LDL Chol Calc (NIH): 126 mg/dL — ABNORMAL HIGH (ref 0–99)
LDL/HDL Ratio: 3.3 ratio — ABNORMAL HIGH (ref 0.0–3.2)
Triglycerides: 133 mg/dL (ref 0–149)
VLDL Cholesterol Cal: 24 mg/dL (ref 5–40)

## 2019-09-18 LAB — TSH: TSH: 3.28 u[IU]/mL (ref 0.450–4.500)

## 2019-09-28 DIAGNOSIS — M5116 Intervertebral disc disorders with radiculopathy, lumbar region: Secondary | ICD-10-CM | POA: Diagnosis not present

## 2019-09-28 DIAGNOSIS — M4726 Other spondylosis with radiculopathy, lumbar region: Secondary | ICD-10-CM | POA: Diagnosis not present

## 2019-10-02 NOTE — Progress Notes (Signed)
See sticky note

## 2019-10-12 ENCOUNTER — Ambulatory Visit: Payer: Medicare Other | Attending: Internal Medicine

## 2019-10-12 ENCOUNTER — Other Ambulatory Visit: Payer: Self-pay

## 2019-10-12 DIAGNOSIS — Z23 Encounter for immunization: Secondary | ICD-10-CM

## 2019-10-12 NOTE — Progress Notes (Signed)
   Covid-19 Vaccination Clinic  Name:  Felicia Woods    MRN: [id number] DOB: 1955-12-18  10/12/2019  Felicia Woods was observed post Covid-19 immunization for 30 minutes based on pre-vaccination screening without incident. She was provided with Vaccine Information Sheet and instruction to access the V-Safe system.   Felicia Woods was instructed to call 911 with any severe reactions post vaccine: Marland Kitchen Difficulty breathing  . Swelling of face and throat  . A fast heartbeat  . A bad rash all over body  . Dizziness and weakness   Immunizations Administered    Name Date Dose VIS Date Route   Pfizer COVID-19 Vaccine 10/12/2019  8:48 AM 0.3 mL 06/15/2019 Intramuscular   Manufacturer: [place]   Lot: [id number]   [place]: [id number]

## 2019-10-16 ENCOUNTER — Encounter: Payer: Self-pay | Admitting: *Deleted

## 2019-10-16 ENCOUNTER — Telehealth: Payer: Self-pay

## 2019-10-16 ENCOUNTER — Other Ambulatory Visit: Payer: Self-pay

## 2019-10-16 ENCOUNTER — Emergency Department
Admission: EM | Admit: 2019-10-16 | Discharge: 2019-10-16 | Disposition: A | Discharge status: Home / Self Care | Payer: Medicare Other | Attending: Student in an Organized Health Care Education/Training Program | Admitting: Student in an Organized Health Care Education/Training Program

## 2019-10-16 DIAGNOSIS — Z79899 Other long term (current) drug therapy: Secondary | ICD-10-CM | POA: Insufficient documentation

## 2019-10-16 DIAGNOSIS — I2510 Atherosclerotic heart disease of native coronary artery without angina pectoris: Secondary | ICD-10-CM | POA: Insufficient documentation

## 2019-10-16 DIAGNOSIS — M545 Low back pain: Secondary | ICD-10-CM | POA: Diagnosis present

## 2019-10-16 DIAGNOSIS — D1809 Hemangioma of other sites: Secondary | ICD-10-CM | POA: Diagnosis not present

## 2019-10-16 DIAGNOSIS — M5136 Other intervertebral disc degeneration, lumbar region: Secondary | ICD-10-CM | POA: Diagnosis not present

## 2019-10-16 DIAGNOSIS — M5431 Sciatica, right side: Secondary | ICD-10-CM

## 2019-10-16 DIAGNOSIS — M5441 Lumbago with sciatica, right side: Secondary | ICD-10-CM | POA: Diagnosis not present

## 2019-10-16 DIAGNOSIS — E119 Type 2 diabetes mellitus without complications: Secondary | ICD-10-CM | POA: Insufficient documentation

## 2019-10-16 DIAGNOSIS — I110 Hypertensive heart disease with heart failure: Secondary | ICD-10-CM | POA: Insufficient documentation

## 2019-10-16 DIAGNOSIS — Z7902 Long term (current) use of antithrombotics/antiplatelets: Secondary | ICD-10-CM | POA: Insufficient documentation

## 2019-10-16 DIAGNOSIS — J45909 Unspecified asthma, uncomplicated: Secondary | ICD-10-CM | POA: Diagnosis not present

## 2019-10-16 DIAGNOSIS — I5032 Chronic diastolic (congestive) heart failure: Secondary | ICD-10-CM | POA: Insufficient documentation

## 2019-10-16 DIAGNOSIS — Z7982 Long term (current) use of aspirin: Secondary | ICD-10-CM | POA: Diagnosis not present

## 2019-10-16 MED ORDER — KETOROLAC TROMETHAMINE 30 MG/ML IJ SOLN
30.0000 mg | Freq: Once | INTRAMUSCULAR | Status: AC
  Administered 2019-10-16: 30 mg via INTRAMUSCULAR
  Filled 2019-10-16: qty 1

## 2019-10-16 MED ORDER — PREDNISONE 50 MG PO TABS: 50.0000 mg | ORAL_TABLET | Freq: Every day | ORAL | 0 refills | Status: DC

## 2019-10-16 MED ORDER — MELOXICAM 15 MG PO TABS: 15.0000 mg | ORAL_TABLET | Freq: Every day | ORAL | 0 refills | Status: DC

## 2019-10-16 MED ORDER — HYDROCODONE-ACETAMINOPHEN 5-325 MG PO TABS
1.0000 | ORAL_TABLET | Freq: Once | ORAL | Status: AC
  Administered 2019-10-16: 1 via ORAL
  Filled 2019-10-16: qty 1

## 2019-10-16 MED ORDER — ORPHENADRINE CITRATE 30 MG/ML IJ SOLN
60.0000 mg | Freq: Once | INTRAMUSCULAR | Status: AC
  Administered 2019-10-16: 60 mg via INTRAMUSCULAR
  Filled 2019-10-16: qty 2

## 2019-10-16 MED ORDER — METHOCARBAMOL 500 MG PO TABS: 500.0000 mg | ORAL_TABLET | Freq: Four times a day (QID) | ORAL | 0 refills | Status: DC

## 2019-10-16 MED ORDER — HYDROCODONE-ACETAMINOPHEN 5-325 MG PO TABS: 1.0000 | ORAL_TABLET | ORAL | 0 refills | Status: DC | PRN

## 2019-10-16 MED ORDER — PREDNISONE 20 MG PO TABS
60.0000 mg | ORAL_TABLET | Freq: Once | ORAL | Status: AC
  Administered 2019-10-16: 60 mg via ORAL
  Filled 2019-10-16: qty 3

## 2019-10-16 NOTE — ED Notes (Signed)
See triage note- Pt reports he thinks he stepped off a porch yesterday and hurt his right hip. States he woke up this morning and struggled to walk down the stairs.

## 2019-10-16 NOTE — ED Triage Notes (Signed)
Pt to ED for right sided back and hip pain x 2 months. Pt unable to tolerate pain at this time and is asking for help with pain management at this time. Pt is ambulatory.

## 2019-10-16 NOTE — Telephone Encounter (Signed)
Patient cancelled appointment on 10/18/2019 will call back to reschedule. klh

## 2019-10-16 NOTE — ED Provider Notes (Signed)
[facility] Emergency Department Provider Note  ____________________________________________  Time seen: Approximately 10:45 PM  I have reviewed the triage vital signs and the nursing notes.   HISTORY  Chief Complaint Back Pain    HPI Felicia Woods is a 64 y.o. female who presents the emergency department requesting medications for back pain with sciatica-like symptoms on the right lower extremity.  Patient states that she has been dealing with increasing pain over the past 2 months.  Patient has seen orthopedics, she has had an MRI done but is not able to find out results as she is still awaiting follow-up appointment.  Patient states that the pain is getting to the point that she cannot tolerate it.  She states that she is having difficulty finding position of comfort for sleep.  Pain, numbness and tingling radiating down the right lower extremity.  No bowel or bladder function, saddle anesthesia.  No other complaints at this time.         Past Medical History:  Diagnosis Date  . Asthma   . Coronary artery disease   . Diabetes mellitus without complication ([place])   . Heart attack ([place])   . Hyperlipidemia   . Hypertension   . MI (myocardial infarction) ([place])   . Migraine headache with aura   . 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    Patient Active Problem List   Diagnosis Date Noted  . NSTEMI (non-ST elevated myocardial infarction) ([place]) 07/02/2019  . Unstable angina ([place]) 02/13/2019  . Acute bilateral low back pain without sciatica 08/21/2018  . Asthma 08/21/2018  . Coronary artery disease involving coronary bypass graft of native heart without angina pectoris 08/21/2018  . Depression, major, single episode, in partial remission ([place]) 08/21/2018  . Chest pain 11/02/2017  . S/P drug eluting coronary stent placement 02/16/2017  . Chronic diastolic CHF (congestive heart failure) ([place]) 12/07/2016  . Numbness and tingling 12/01/2016  . Chronic  tension-type headache, intractable 12/01/2016  . Osteoarthritis of knee 11/12/2016  . Obesity, Class II, BMI 35-39.9 05/13/2016  . Hx of CABG 2010 11/12/2015  . Type 2 diabetes mellitus with circulatory disorder, without long-term current use of insulin ([place]) 11/12/2015  . Morbid obesity ([place]) 11/12/2015  . SOBOE (shortness of breath on exertion) 11/12/2015  . RBBB 11/12/2015  . ST elevation myocardial infarction (STEMI) of inferior wall ([place]) 11/03/2015  . SVG-RCA throbectomy and DES 10/18/15   . Hypertensive heart disease without heart failure   . Vertigo   . Hyperlipidemia   . STEMI 10/18/15 10/18/2015  . Intractable chronic migraine without aura and without status migrainosus 05/08/2015  . Benign essential HTN 03/17/2015  . CAD (coronary artery disease) 08/26/2014  . Headache 08/06/2014    Past Surgical History:  Procedure Laterality Date  . ABDOMINAL HYSTERECTOMY    . CARDIAC CATHETERIZATION    . CARDIAC CATHETERIZATION N/A 10/18/2015   Procedure: Left Heart Cath and Cors/Grafts Angiography;  Surgeon: Lorretta Harp, MD;  Location: [place] [facility];  Service: Cardiovascular;  Laterality: N/A;  . CARDIAC CATHETERIZATION N/A 10/18/2015   Procedure: Coronary Stent Intervention;  Surgeon: Lorretta Harp, MD;  Location: [place] [facility];  Service: Cardiovascular;  Laterality: N/A;  . CARDIAC SURGERY    . CHOLECYSTECTOMY    . CORONARY ANGIOPLASTY    . CORONARY ARTERY BYPASS GRAFT     4 vessels - 2010  . CORONARY STENT INTERVENTION N/A 02/16/2017   Procedure: CORONARY STENT INTERVENTION;  Surgeon: Yolonda Kida,  MD;  Location: [place] [facility];  Service: Cardiovascular;  Laterality: N/A;  . CORONARY STENT INTERVENTION N/A 02/13/2019   Procedure: CORONARY STENT INTERVENTION;  Surgeon: Nelva Bush, MD;  Location: [place] [facility];  Service: Cardiovascular;  Laterality: N/A;  SVG to RCA  . CORONARY STENT INTERVENTION Left 03/08/2019   Procedure: CORONARY STENT  INTERVENTION;  Surgeon: Yolonda Kida, MD;  Location: [place] [facility];  Service: Cardiovascular;  Laterality: Left;  . LEFT HEART CATH AND CORONARY ANGIOGRAPHY Left 02/16/2017   Procedure: LEFT HEART CATH AND CORONARY ANGIOGRAPHY;  Surgeon: Corey Skains, MD;  Location: [place] [facility];  Service: Cardiovascular;  Laterality: Left;  . LEFT HEART CATH AND CORS/GRAFTS ANGIOGRAPHY Left 11/23/2017   Procedure: LEFT HEART CATH AND CORS/GRAFTS ANGIOGRAPHY;  Surgeon: Corey Skains, MD;  Location: [place] [facility];  Service: Cardiovascular;  Laterality: Left;  . LEFT HEART CATH AND CORS/GRAFTS ANGIOGRAPHY N/A 02/13/2019   Procedure: LEFT HEART CATH AND CORS/GRAFTS ANGIOGRAPHY;  Surgeon: Corey Skains, MD;  Location: [place] [facility];  Service: Cardiovascular;  Laterality: N/A;  . LEFT HEART CATH AND CORS/GRAFTS ANGIOGRAPHY N/A 07/03/2019   Procedure: LEFT HEART CATH AND CORS/GRAFTS ANGIOGRAPHY;  Surgeon: Corey Skains, MD;  Location: [place] [facility];  Service: Cardiovascular;  Laterality: N/A;    Prior to Admission medications   Medication Sig Start Date End Date Taking? Authorizing Provider  Accu-Chek Softclix Lancets lancets Use as instructed once  DAILY DIAG -E11.9 08/20/19   Kendell Bane, NP  albuterol (PROVENTIL HFA;VENTOLIN HFA) 108 (90 Base) MCG/ACT inhaler Inhale 2 puffs into the lungs every 6 (six) hours as needed for wheezing or shortness of breath.    [provider]  amLODipine (NORVASC) 10 MG tablet Take 1 tablet (10 mg total) by mouth daily. 08/20/19   Kendell Bane, NP  aspirin EC 81 MG tablet Take 81 mg by mouth daily.    [provider]  atorvastatin (LIPITOR) 80 MG tablet Take 80 mg by mouth daily. 06/07/19   [provider]  bisoprolol-hydrochlorothiazide (ZIAC) 5-6.25 MG tablet Take 1 tablet by mouth daily. 08/20/19   Kendell Bane, NP  clopidogrel (PLAVIX) 75 MG tablet Take 1 tablet (75 mg total) by mouth daily.  08/20/19   Kendell Bane, NP  cyclobenzaprine (FLEXERIL) 10 MG tablet Take 1 tablet (10 mg total) by mouth 3 (three) times daily as needed for muscle spasms. 08/20/19   [place], Audie Clear, NP  Dulaglutide (TRULICITY) 1.5 UR/4.2HC SOPN Inject 1.5 mg into the skin once a week. Patient taking differently: Inject 1.5 mg into the skin every Thursday.  01/18/19   Kendell Bane, NP  furosemide (LASIX) 40 MG tablet Take 1 tablet (40 mg total) by mouth 2 (two) times daily. 08/20/19   Kendell Bane, NP  glimepiride (AMARYL) 1 MG tablet Take 1 tablet (1 mg total) by mouth daily with breakfast. 08/20/19   Kendell Bane, NP  glucose blood (ACCU-CHEK AVIVA PLUS) test strip 1 each by Other route as needed for other. Use as instructed once  DAILY DIAG -E11.9 08/20/19   Kendell Bane, NP  HYDROcodone-acetaminophen (NORCO/VICODIN) 5-325 MG tablet Take 1 tablet by mouth every 4 (four) hours as needed for moderate pain. 10/16/19   Andrienne Havener, Charline Bills, PA-C  isosorbide mononitrate (IMDUR) 30 MG 24 hr tablet Take 1 tablet (30 mg total) by mouth daily. 03/10/19   [place], Dwayne D, MD  losartan (COZAAR) 100  MG tablet Take 1 tablet (100 mg total) by mouth daily. Take one tab po qd 08/20/19   Kendell Bane, NP  meloxicam (MOBIC) 15 MG tablet Take 1 tablet (15 mg total) by mouth daily. 10/16/19   Lelend Heinecke, Charline Bills, PA-C  methocarbamol (ROBAXIN) 500 MG tablet Take 1 tablet (500 mg total) by mouth 4 (four) times daily. 10/16/19   Easton Fetty, Charline Bills, PA-C  montelukast (SINGULAIR) 10 MG tablet Take 1 tablet (10 mg total) by mouth at bedtime. 08/20/19   Kendell Bane, NP  nitroGLYCERIN (NITROSTAT) 0.4 MG SL tablet Place 0.4 mg under the tongue every 5 (five) minutes x 3 doses as needed for chest pain. *If no relief call MD or go to Emergency Room*    [provider]  oxybutynin (DITROPAN) 5 MG tablet Take 1 tablet (5 mg total) by mouth daily after supper. 08/20/19   Kendell Bane, NP   oxyCODONE-acetaminophen (PERCOCET) 5-325 MG tablet Take 1-2 tablets by mouth every 8 (eight) hours as needed for severe pain. 07/12/19   Kendell Bane, NP  predniSONE (DELTASONE) 50 MG tablet Take 1 tablet (50 mg total) by mouth daily with breakfast. 10/16/19   Alwyn Cordner, Charline Bills, PA-C  ranolazine (RANEXA) 500 MG 12 hr tablet Take 500 mg by mouth 2 (two) times daily. 06/07/19   [provider]  sertraline (ZOLOFT) 100 MG tablet Take 1 tablet (100 mg total) by mouth daily. 09/08/18   Kendell Bane, NP  simvastatin (ZOCOR) 40 MG tablet Take 40 mg by mouth daily.    [provider]  zolpidem (AMBIEN) 10 MG tablet Take 1 tablet (10 mg total) by mouth at bedtime. 08/20/19   Kendell Bane, NP    Allergies Penicillin g  Family History  Problem Relation Age of Onset  . Diabetes Mother   . Diabetes Father   . Cancer Father   . Diabetes Brother     Social History Social History   Tobacco Use  . Smoking status: Never Smoker  . Smokeless tobacco: Never Used  Substance Use Topics  . Alcohol use: No    Alcohol/week: 0.0 standard drinks  . Drug use: No     Review of Systems  Constitutional: No fever/chills Eyes: No visual changes. No discharge ENT: No upper respiratory complaints. Cardiovascular: no chest pain. Respiratory: no cough. No SOB. Gastrointestinal: No abdominal pain.  No nausea, no vomiting.  No diarrhea.  No constipation. Genitourinary: Negative for dysuria. No hematuria Musculoskeletal: Positive for lower back pain with right-sided sciatica Skin: Negative for rash, abrasions, lacerations, ecchymosis. Neurological: Negative for headaches, focal weakness or numbness. 10-point ROS otherwise negative.  ____________________________________________   PHYSICAL EXAM:  VITAL SIGNS: ED Triage Vitals  Enc Vitals Group     BP 10/16/19 2111 (!) 168/90     Pulse Rate 10/16/19 2111 74     Resp 10/16/19 2111 16     Temp 10/16/19 2111 98.6 F (37 C)      Temp Source 10/16/19 2111 Oral     SpO2 10/16/19 2111 97 %     Weight 10/16/19 2112 240 lb (108.9 kg)     Height 10/16/19 2112 '5\' 7"'  (1.702 m)     Head Circumference --      Peak Flow --      Pain Score 10/16/19 2112 9     Pain Loc --      Pain Edu? --      Excl. in [place]? --  Constitutional: Alert and oriented. Well appearing and in no acute distress. Eyes: Conjunctivae are normal. PERRL. EOMI. Head: Atraumatic. ENT:      Ears:       Nose: No congestion/rhinnorhea.      Mouth/Throat: Mucous membranes are moist.  Neck: No stridor.    Cardiovascular: Normal rate, regular rhythm. Normal S1 and S2.  Good peripheral circulation. Respiratory: Normal respiratory effort without tachypnea or retractions. Lungs CTAB. Good air entry to the bases with no decreased or absent breath sounds. Gastrointestinal: Bowel sounds 4 quadrants. Soft and nontender to palpation. No guarding or rigidity. No palpable masses. No distention. No CVA tenderness. Musculoskeletal: Full range of motion to all extremities. No gross deformities appreciated.  Visualization of the lumbar spine reveals no visible signs of trauma.  No midline tenderness but diffuse right paraspinal muscle tenderness extending into the right-sided sciatic notch.  Positive straight leg raise right side.  Dorsalis pedis pulse and sensation intact distally. Neurologic:  Normal speech and language. No gross focal neurologic deficits are appreciated.  Skin:  Skin is warm, dry and intact. No rash noted. Psychiatric: Mood and affect are normal. Speech and behavior are normal. Patient exhibits appropriate insight and judgement.   ____________________________________________   LABS (all labs ordered are listed, but only abnormal results are displayed)  Labs Reviewed - No data to display ____________________________________________  EKG   ____________________________________________  RADIOLOGY   Patient had a recent MRI done at [facility]  health system.  Using epic I was able to review the report.  I was unable to view the images themselves.    FINDINGS: #  # Vertebral body heights are well maintained.  # Type I Modic endplate change is present at L2-3. # L1 hemangioma. # Conus terminates at T12-L1 without evidence of tethering. # Nerve roots appear normal.  # Incidental findings: None.   # L1-2: Normal. #  # L2-3: Mild degenerative disc disease. There is facet arthropathy and disc bulge causing mild central canal stenosis. Neuroforamina are patent. #  # L3-4: Disc desiccation. There is facet arthropathy and disc bulge causing moderate central canal stenosis. Neuroforamina are patent. #  # L4-5: Disc desiccation. Trace anterolisthesis. There is facet arthropathy and disc bulge causing mild central canal stenosis. Neuroforamina are patent #  # L5-S1: Marked left-sided facet arthropathy causes mild neural foraminal narrowing. No significant central canal stenosis.  No results found.  ____________________________________________    PROCEDURES  Procedure(s) performed:    Procedures    Medications  HYDROcodone-acetaminophen (NORCO/VICODIN) 5-325 MG per tablet 1 tablet (1 tablet Oral Given 10/16/19 2303)  ketorolac (TORADOL) 30 MG/ML injection 30 mg (30 mg Intramuscular Given 10/16/19 2304)  orphenadrine (NORFLEX) injection 60 mg (60 mg Intramuscular Given 10/16/19 2304)  predniSONE (DELTASONE) tablet 60 mg (60 mg Oral Given 10/16/19 2303)     ____________________________________________   INITIAL IMPRESSION / ASSESSMENT AND PLAN / ED COURSE  Pertinent labs & imaging results that were available during my care of the patient were reviewed by me and considered in my medical decision making (see chart for details).  Review of the [place] CSRS was performed in accordance of the [place] prior to dispensing any controlled drugs.           Patient's diagnosis is consistent with right-sided sciatica,  degenerative disc disease, hemangioma of the spine.  Patient presented to emergency department with increasing lower back pain.  Patient had a recent MRI but had not received results yet.  I reviewed the MRI  findings through care everywhere.  Patient will be placed on anti-inflammatory, steroid, muscle relaxer, pain medication and referred back to orthopedics.  Patient will likely need to see neurosurgery and patient is advised that she may follow-up with neurosurgery available at [facility] where she may seek care at [facility] clinic neurosurgery.  Patient verbalizes understanding of same.  No emergent findings on physical exam warranting emergent neurosurgery evaluation.  Patient stable for discharge..  Patient is given ED precautions to return to the ED for any worsening or new symptoms.     ____________________________________________  FINAL CLINICAL IMPRESSION(S) / ED DIAGNOSES  Final diagnoses:  Sciatica of right side  DDD (degenerative disc disease), lumbar  Hemangioma of spine      NEW MEDICATIONS STARTED DURING THIS VISIT:  ED Discharge Orders         Ordered    HYDROcodone-acetaminophen (NORCO/VICODIN) 5-325 MG tablet  Every 4 hours PRN     10/16/19 2252    predniSONE (DELTASONE) 50 MG tablet  Daily with breakfast     10/16/19 2252    meloxicam (MOBIC) 15 MG tablet  Daily     10/16/19 2252    methocarbamol (ROBAXIN) 500 MG tablet  4 times daily     10/16/19 2252              This chart was dictated using voice recognition software/Dragon. Despite best efforts to proofread, errors can occur which can change the meaning. Any change was purely unintentional.    Darletta Moll, PA-C 10/16/19 2355    Merlyn Lot, MD 10/17/19 [phone]

## 2019-10-18 ENCOUNTER — Ambulatory Visit: Payer: Medicare Other | Admitting: Adult Health

## 2019-10-25 DIAGNOSIS — M7061 Trochanteric bursitis, right hip: Secondary | ICD-10-CM | POA: Diagnosis not present

## 2019-10-26 ENCOUNTER — Telehealth: Payer: Self-pay

## 2019-10-26 NOTE — Telephone Encounter (Signed)
Tried contacting patient to inform of appointment on 10/30/2019, no voicemail. klh

## 2019-10-30 ENCOUNTER — Ambulatory Visit (INDEPENDENT_AMBULATORY_CARE_PROVIDER_SITE_OTHER): Payer: Medicare Other | Admitting: Adult Health

## 2019-10-30 ENCOUNTER — Other Ambulatory Visit: Payer: Self-pay

## 2019-10-30 ENCOUNTER — Encounter: Payer: Self-pay | Admitting: Adult Health

## 2019-10-30 VITALS — BP 159/92 | HR 80 | Temp 97.4°F | Resp 16 | Ht 67.0 in | Wt 254.4 lb

## 2019-10-30 DIAGNOSIS — M48061 Spinal stenosis, lumbar region without neurogenic claudication: Secondary | ICD-10-CM

## 2019-10-30 DIAGNOSIS — I10 Essential (primary) hypertension: Secondary | ICD-10-CM

## 2019-10-30 DIAGNOSIS — R718 Other abnormality of red blood cells: Secondary | ICD-10-CM | POA: Diagnosis not present

## 2019-10-30 DIAGNOSIS — E1165 Type 2 diabetes mellitus with hyperglycemia: Secondary | ICD-10-CM | POA: Diagnosis not present

## 2019-10-30 NOTE — Progress Notes (Signed)
[facility] [place], [place] [address]  Internal MEDICINE  Office Visit Note  Patient Name: Felicia Woods  [id number]  [id number]  Date of Service: 11/20/2019  Chief Complaint  Patient presents with  . Follow-up    review labs    HPI  Pt is here for follow up on lab results.  Overall she is doing well.  She denies any new symptoms.  Her back and hip continues to hurt.  She saw ortho, and eventually had an MRI.  The MRI showed "1.  Lumbar spondylosis with multilevel facet arthropathy and disc bulging most significant at L3-4 causing moderate central canal stenosis.2.  There is mild central canal stenosis at L2-3 and L4-5."  She refused surgical evaluation, and is getting shots every 3 weeks at this time for pain management. Her labs revealed a low MCV, will get iron and ferritin studies at this time.    Current Medication: Outpatient Encounter Medications as of 10/30/2019  Medication Sig Note  . Accu-Chek Softclix Lancets lancets Use as instructed once  DAILY DIAG -E11.9   . albuterol (PROVENTIL HFA;VENTOLIN HFA) 108 (90 Base) MCG/ACT inhaler Inhale 2 puffs into the lungs every 6 (six) hours as needed for wheezing or shortness of breath.   Marland Kitchen amLODipine (NORVASC) 10 MG tablet Take 1 tablet (10 mg total) by mouth daily.   Marland Kitchen aspirin EC 81 MG tablet Take 81 mg by mouth daily.   Marland Kitchen atorvastatin (LIPITOR) 80 MG tablet Take 80 mg by mouth daily.   . bisoprolol-hydrochlorothiazide (ZIAC) 5-6.25 MG tablet Take 1 tablet by mouth daily.   . clopidogrel (PLAVIX) 75 MG tablet Take 1 tablet (75 mg total) by mouth daily.   . cyclobenzaprine (FLEXERIL) 10 MG tablet Take 1 tablet (10 mg total) by mouth 3 (three) times daily as needed for muscle spasms.   . Dulaglutide (TRULICITY) 1.5 MW/1.0UV SOPN Inject 1.5 mg into the skin once a week. (Patient not taking: Reported on 11/19/2019)   . furosemide (LASIX) 40 MG tablet Take 1 tablet (40 mg total) by mouth 2 (two) times daily.   Marland Kitchen  glimepiride (AMARYL) 1 MG tablet Take 1 tablet (1 mg total) by mouth daily with breakfast.   . glucose blood (ACCU-CHEK AVIVA PLUS) test strip 1 each by Other route as needed for other. Use as instructed once  DAILY DIAG -E11.9   . HYDROcodone-acetaminophen (NORCO/VICODIN) 5-325 MG tablet Take 1 tablet by mouth every 4 (four) hours as needed for moderate pain. (Patient not taking: Reported on 11/19/2019)   . isosorbide mononitrate (IMDUR) 30 MG 24 hr tablet Take 1 tablet (30 mg total) by mouth daily.   Marland Kitchen losartan (COZAAR) 100 MG tablet Take 1 tablet (100 mg total) by mouth daily. Take one tab po qd   . meloxicam (MOBIC) 15 MG tablet Take 1 tablet (15 mg total) by mouth daily.   . methocarbamol (ROBAXIN) 500 MG tablet Take 1 tablet (500 mg total) by mouth 4 (four) times daily.   . montelukast (SINGULAIR) 10 MG tablet Take 1 tablet (10 mg total) by mouth at bedtime. (Patient not taking: Reported on 11/19/2019)   . nitroGLYCERIN (NITROSTAT) 0.4 MG SL tablet Place 0.4 mg under the tongue every 5 (five) minutes x 3 doses as needed for chest pain. *If no relief call MD or go to Emergency Room*   . oxybutynin (DITROPAN) 5 MG tablet Take 1 tablet (5 mg total) by mouth daily after supper. (Patient not taking: Reported on 11/19/2019)   .  oxyCODONE-acetaminophen (PERCOCET) 5-325 MG tablet Take 1-2 tablets by mouth every 8 (eight) hours as needed for severe pain. (Patient not taking: Reported on 11/19/2019)   . ranolazine (RANEXA) 500 MG 12 hr tablet Take 500 mg by mouth 2 (two) times daily.   . sertraline (ZOLOFT) 100 MG tablet Take 1 tablet (100 mg total) by mouth daily. 02/20/2019: Not currently taking  . simvastatin (ZOCOR) 40 MG tablet Take 40 mg by mouth daily.   Marland Kitchen zolpidem (AMBIEN) 10 MG tablet Take 1 tablet (10 mg total) by mouth at bedtime.   . [DISCONTINUED] predniSONE (DELTASONE) 50 MG tablet Take 1 tablet (50 mg total) by mouth daily with breakfast.    No facility-administered encounter medications on  file as of 10/30/2019.    Surgical History: Past Surgical History:  Procedure Laterality Date  . ABDOMINAL HYSTERECTOMY    . CARDIAC CATHETERIZATION    . CARDIAC CATHETERIZATION N/A 10/18/2015   Procedure: Left Heart Cath and Cors/Grafts Angiography;  Surgeon: Lorretta Harp, MD;  Location: [place] [facility];  Service: Cardiovascular;  Laterality: N/A;  . CARDIAC CATHETERIZATION N/A 10/18/2015   Procedure: Coronary Stent Intervention;  Surgeon: Lorretta Harp, MD;  Location: [place] [facility];  Service: Cardiovascular;  Laterality: N/A;  . CARDIAC SURGERY    . CHOLECYSTECTOMY    . CORONARY ANGIOPLASTY    . CORONARY ARTERY BYPASS GRAFT     4 vessels - 2010  . CORONARY STENT INTERVENTION N/A 02/16/2017   Procedure: CORONARY STENT INTERVENTION;  Surgeon: Yolonda Kida, MD;  Location: [place] [facility];  Service: Cardiovascular;  Laterality: N/A;  . CORONARY STENT INTERVENTION N/A 02/13/2019   Procedure: CORONARY STENT INTERVENTION;  Surgeon: Nelva Bush, MD;  Location: [place] [facility];  Service: Cardiovascular;  Laterality: N/A;  SVG to RCA  . CORONARY STENT INTERVENTION Left 03/08/2019   Procedure: CORONARY STENT INTERVENTION;  Surgeon: Yolonda Kida, MD;  Location: [place] [facility];  Service: Cardiovascular;  Laterality: Left;  . LEFT HEART CATH AND CORONARY ANGIOGRAPHY Left 02/16/2017   Procedure: LEFT HEART CATH AND CORONARY ANGIOGRAPHY;  Surgeon: Corey Skains, MD;  Location: [place] [facility];  Service: Cardiovascular;  Laterality: Left;  . LEFT HEART CATH AND CORS/GRAFTS ANGIOGRAPHY Left 11/23/2017   Procedure: LEFT HEART CATH AND CORS/GRAFTS ANGIOGRAPHY;  Surgeon: Corey Skains, MD;  Location: [place] [facility];  Service: Cardiovascular;  Laterality: Left;  . LEFT HEART CATH AND CORS/GRAFTS ANGIOGRAPHY N/A 02/13/2019   Procedure: LEFT HEART CATH AND CORS/GRAFTS ANGIOGRAPHY;  Surgeon: Corey Skains, MD;  Location: [place] [facility];   Service: Cardiovascular;  Laterality: N/A;  . LEFT HEART CATH AND CORS/GRAFTS ANGIOGRAPHY N/A 07/03/2019   Procedure: LEFT HEART CATH AND CORS/GRAFTS ANGIOGRAPHY;  Surgeon: Corey Skains, MD;  Location: [place] [facility];  Service: Cardiovascular;  Laterality: N/A;    Medical History: Past Medical History:  Diagnosis Date  . Asthma   . Coronary artery disease   . Diabetes mellitus without complication ([place])   . Heart attack ([place])   . Hyperlipidemia   . Hypertension   . MI (myocardial infarction) ([place])   . Migraine headache with aura   . 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    Family History: Family History  Problem Relation Age of Onset  . Diabetes Mother   . Diabetes Father   . Cancer Father   . Diabetes Brother     Social History   Socioeconomic History  .  Marital status: Married    Spouse name: Jeneen Rinks  . Number of children: Not on file  . Years of education: Not on file  . Highest education level: Not on file  Occupational History  . Not on file  Tobacco Use  . Smoking status: Never Smoker  . Smokeless tobacco: Never Used  Substance and Sexual Activity  . Alcohol use: No    Alcohol/week: 0.0 standard drinks  . Drug use: No  . Sexual activity: Yes  Other Topics Concern  . Not on file  Social History Narrative  . Not on file   Social Determinants of Health   Financial Resource Strain:   . Difficulty of Paying Living Expenses:   Food Insecurity:   . Worried About Charity fundraiser in the Last Year:   . Arboriculturist in the Last Year:   Transportation Needs:   . Film/video editor (Medical):   Marland Kitchen Lack of Transportation (Non-Medical):   Physical Activity:   . Days of Exercise per Week:   . Minutes of Exercise per Session:   Stress:   . Feeling of Stress :   Social Connections:   . Frequency of Communication with Friends and Family:   . Frequency of Social Gatherings with Friends and Family:   . Attends Religious Services:   . Active  Member of Clubs or Organizations:   . Attends Archivist Meetings:   Marland Kitchen Marital Status:   Intimate Partner Violence:   . Fear of Current or Ex-Partner:   . Emotionally Abused:   Marland Kitchen Physically Abused:   . Sexually Abused:       Review of Systems  Constitutional: Negative for chills, fatigue and unexpected weight change.  HENT: Negative for congestion, rhinorrhea, sneezing and sore throat.   Eyes: Negative for photophobia, pain and redness.  Respiratory: Negative for cough, chest tightness and shortness of breath.   Cardiovascular: Negative for chest pain and palpitations.  Gastrointestinal: Negative for abdominal pain, constipation, diarrhea, nausea and vomiting.  Endocrine: Negative.   Genitourinary: Negative for dysuria and frequency.  Musculoskeletal: Negative for arthralgias, back pain, joint swelling and neck pain.  Skin: Negative for rash.  Allergic/Immunologic: Negative.   Neurological: Negative for tremors and numbness.  Hematological: Negative for adenopathy. Does not bruise/bleed easily.  Psychiatric/Behavioral: Negative for behavioral problems and sleep disturbance. The patient is not nervous/anxious.     Vital Signs: BP (!) 159/92   Pulse 80   Temp (!) 97.4 F (36.3 C)   Resp 16   Ht '5\' 7"'  (1.702 m)   Wt 254 lb 6.4 oz (115.4 kg)   SpO2 96%   BMI 39.84 kg/m    Physical Exam Vitals and nursing note reviewed.  Constitutional:      General: She is not in acute distress.    Appearance: She is well-developed. She is not diaphoretic.  HENT:     Head: Normocephalic and atraumatic.     Mouth/Throat:     Pharynx: No oropharyngeal exudate.  Eyes:     Pupils: Pupils are equal, round, and reactive to light.  Neck:     Thyroid: No thyromegaly.     Vascular: No JVD.     Trachea: No tracheal deviation.  Cardiovascular:     Rate and Rhythm: Normal rate and regular rhythm.     Heart sounds: Normal heart sounds. No murmur. No friction rub. No gallop.    Pulmonary:     Effort: Pulmonary effort is normal. No  respiratory distress.     Breath sounds: Normal breath sounds. No wheezing or rales.  Chest:     Chest wall: No tenderness.  Abdominal:     Palpations: Abdomen is soft.     Tenderness: There is no abdominal tenderness. There is no guarding.  Musculoskeletal:        General: Normal range of motion.     Cervical back: Normal range of motion and neck supple.  Lymphadenopathy:     Cervical: No cervical adenopathy.  Skin:    General: Skin is warm and dry.  Neurological:     Mental Status: She is alert and oriented to person, place, and time.     Cranial Nerves: No cranial nerve deficit.  Psychiatric:        Behavior: Behavior normal.        Thought Content: Thought content normal.        Judgment: Judgment normal.     Assessment/Plan: 1. Low mean corpuscular volume (MCV) Have Iron and vitamin studies done, and follow up when results are available.  - Fe+TIBC+Fer - B12 and Folate Panel - VITAMIN D 25 Hydroxy (Vit-D Deficiency, Fractures)  2. Essential hypertension Slightly elevated today, patient is having significant pain, and just came from work.   3. Spinal stenosis at L4-L5 level Follow up with ortho for injections as discussed.   4. Uncontrolled type 2 diabetes mellitus with hyperglycemia ([place]) Stable,continue current management.  Follow up for A1C check.   General Counseling: Lydie verbalizes understanding of the findings of todays visit and agrees with plan of treatment. I have discussed any further diagnostic evaluation that may be needed or ordered today. We also reviewed her medications today. she has been encouraged to call the office with any questions or concerns that should arise related to todays visit.    Orders Placed This Encounter  Procedures  . Fe+TIBC+Fer  . B12 and Folate Panel  . VITAMIN D 25 Hydroxy (Vit-D Deficiency, Fractures)    No orders of the defined types were placed in this  encounter.   Time spent: 30 Minutes   This patient was seen by Orson Gear AGNP-C in Collaboration with Dr Lavera Guise as a part of collaborative care agreement     Kendell Bane AGNP-C Internal medicine

## 2019-11-06 ENCOUNTER — Ambulatory Visit: Payer: Medicare Other | Attending: Internal Medicine

## 2019-11-06 DIAGNOSIS — Z23 Encounter for immunization: Secondary | ICD-10-CM

## 2019-11-06 NOTE — Progress Notes (Signed)
   Covid-19 Vaccination Clinic  Name:  Makaylyn Griepentrog    MRN: [id number] DOB: 08/07/1955  11/06/2019  Ms. Maduro was observed post Covid-19 immunization for 15 minutes without incident. She was provided with Vaccine Information Sheet and instruction to access the V-Safe system.   Ms. Adolf was instructed to call 911 with any severe reactions post vaccine: Marland Kitchen Difficulty breathing  . Swelling of face and throat  . A fast heartbeat  . A bad rash all over body  . Dizziness and weakness   Immunizations Administered    Name Date Dose VIS Date Route   Pfizer COVID-19 Vaccine 11/06/2019 10:19 AM 0.3 mL 08/29/2018 Intramuscular   Manufacturer: [place]   Lot: [id number]   [place]: [id number]

## 2019-11-08 DIAGNOSIS — I10 Essential (primary) hypertension: Secondary | ICD-10-CM | POA: Diagnosis not present

## 2019-11-08 DIAGNOSIS — M7061 Trochanteric bursitis, right hip: Secondary | ICD-10-CM | POA: Diagnosis not present

## 2019-11-08 DIAGNOSIS — I5032 Chronic diastolic (congestive) heart failure: Secondary | ICD-10-CM | POA: Diagnosis not present

## 2019-11-08 DIAGNOSIS — I25708 Atherosclerosis of coronary artery bypass graft(s), unspecified, with other forms of angina pectoris: Secondary | ICD-10-CM | POA: Diagnosis not present

## 2019-11-09 ENCOUNTER — Telehealth: Payer: Self-pay

## 2019-11-09 DIAGNOSIS — R5383 Other fatigue: Secondary | ICD-10-CM | POA: Diagnosis not present

## 2019-11-09 DIAGNOSIS — R718 Other abnormality of red blood cells: Secondary | ICD-10-CM | POA: Diagnosis not present

## 2019-11-09 NOTE — Telephone Encounter (Signed)
Confirmed appointment on 11/13/2019 and screened for covid. klh

## 2019-11-10 LAB — IRON,TIBC AND FERRITIN PANEL
Ferritin: 80 ng/mL (ref 15–150)
Iron Saturation: 18 % (ref 15–55)
Iron: 54 ug/dL (ref 27–139)
Total Iron Binding Capacity: 307 ug/dL (ref 250–450)
UIBC: 253 ug/dL (ref 118–369)

## 2019-11-10 LAB — B12 AND FOLATE PANEL
Folate: 8.7 ng/mL (ref >3.0)
Vitamin B-12: 390 pg/mL (ref 232–1245)

## 2019-11-10 LAB — VITAMIN D 25 HYDROXY (VIT D DEFICIENCY, FRACTURES): Vit D, 25-Hydroxy: 14.6 ng/mL — ABNORMAL LOW (ref 30.0–100.0)

## 2019-11-13 ENCOUNTER — Ambulatory Visit (INDEPENDENT_AMBULATORY_CARE_PROVIDER_SITE_OTHER): Payer: Medicare Other | Admitting: Adult Health

## 2019-11-13 ENCOUNTER — Encounter: Payer: Self-pay | Admitting: Adult Health

## 2019-11-13 ENCOUNTER — Other Ambulatory Visit: Payer: Self-pay | Admitting: Adult Health

## 2019-11-13 ENCOUNTER — Other Ambulatory Visit: Payer: Self-pay

## 2019-11-13 ENCOUNTER — Telehealth: Payer: Self-pay

## 2019-11-13 VITALS — BP 130/80 | HR 77 | Temp 97.2°F | Resp 16 | Ht 67.0 in | Wt 254.0 lb

## 2019-11-13 DIAGNOSIS — I10 Essential (primary) hypertension: Secondary | ICD-10-CM

## 2019-11-13 DIAGNOSIS — E559 Vitamin D deficiency, unspecified: Secondary | ICD-10-CM | POA: Diagnosis not present

## 2019-11-13 DIAGNOSIS — Z1211 Encounter for screening for malignant neoplasm of colon: Secondary | ICD-10-CM | POA: Diagnosis not present

## 2019-11-13 DIAGNOSIS — E1165 Type 2 diabetes mellitus with hyperglycemia: Secondary | ICD-10-CM

## 2019-11-13 LAB — POCT GLYCOSYLATED HEMOGLOBIN (HGB A1C): Hemoglobin A1C: 6.8 % — AB (ref 4.0–5.6)

## 2019-11-13 MED ORDER — VITAMIN D (ERGOCALCIFEROL) 1.25 MG (50000 UNIT) PO CAPS: 50000.0000 [IU] | ORAL_CAPSULE | ORAL | 0 refills | Status: DC

## 2019-11-13 NOTE — Progress Notes (Signed)
[facility] [place], [place] [address]  Internal MEDICINE  Office Visit Note  Patient Name: Felicia Woods  [id number]  [id number]  Date of Service: 11/13/2019  Chief Complaint  Patient presents with  . Follow-up    review labs and other test    HPI  Pt is here for follow up on labs. Her vitamin D is low at 14.  She is prescribed Vit D to take weekly.  She overall is at her baseline. She denies any issues at this time.  Her A1C today is 6.4.  She reports a significant history of colon cancer in her family and reports her last Colonoscopy was in 2011, and she would like a referral to have this done again.    Current Medication: Outpatient Encounter Medications as of 11/13/2019  Medication Sig Note  . Accu-Chek Softclix Lancets lancets Use as instructed once  DAILY DIAG -E11.9   . albuterol (PROVENTIL HFA;VENTOLIN HFA) 108 (90 Base) MCG/ACT inhaler Inhale 2 puffs into the lungs every 6 (six) hours as needed for wheezing or shortness of breath.   Marland Kitchen amLODipine (NORVASC) 10 MG tablet Take 1 tablet (10 mg total) by mouth daily.   Marland Kitchen aspirin EC 81 MG tablet Take 81 mg by mouth daily.   Marland Kitchen atorvastatin (LIPITOR) 80 MG tablet Take 80 mg by mouth daily.   . bisoprolol-hydrochlorothiazide (ZIAC) 5-6.25 MG tablet Take 1 tablet by mouth daily.   . clopidogrel (PLAVIX) 75 MG tablet Take 1 tablet (75 mg total) by mouth daily.   . cyclobenzaprine (FLEXERIL) 10 MG tablet Take 1 tablet (10 mg total) by mouth 3 (three) times daily as needed for muscle spasms.   . Dulaglutide (TRULICITY) 1.5 WS/5.6CL SOPN Inject 1.5 mg into the skin once a week. (Patient taking differently: Inject 1.5 mg into the skin every Thursday. )   . furosemide (LASIX) 40 MG tablet Take 1 tablet (40 mg total) by mouth 2 (two) times daily.   Marland Kitchen glimepiride (AMARYL) 1 MG tablet Take 1 tablet (1 mg total) by mouth daily with breakfast.   . glucose blood (ACCU-CHEK AVIVA PLUS) test strip 1 each by Other route as  needed for other. Use as instructed once  DAILY DIAG -E11.9   . HYDROcodone-acetaminophen (NORCO/VICODIN) 5-325 MG tablet Take 1 tablet by mouth every 4 (four) hours as needed for moderate pain.   . isosorbide mononitrate (IMDUR) 30 MG 24 hr tablet Take 1 tablet (30 mg total) by mouth daily.   Marland Kitchen losartan (COZAAR) 100 MG tablet Take 1 tablet (100 mg total) by mouth daily. Take one tab po qd   . meloxicam (MOBIC) 15 MG tablet Take 1 tablet (15 mg total) by mouth daily.   . methocarbamol (ROBAXIN) 500 MG tablet Take 1 tablet (500 mg total) by mouth 4 (four) times daily.   . montelukast (SINGULAIR) 10 MG tablet Take 1 tablet (10 mg total) by mouth at bedtime.   . nitroGLYCERIN (NITROSTAT) 0.4 MG SL tablet Place 0.4 mg under the tongue every 5 (five) minutes x 3 doses as needed for chest pain. *If no relief call MD or go to Emergency Room*   . oxybutynin (DITROPAN) 5 MG tablet Take 1 tablet (5 mg total) by mouth daily after supper.   Marland Kitchen oxyCODONE-acetaminophen (PERCOCET) 5-325 MG tablet Take 1-2 tablets by mouth every 8 (eight) hours as needed for severe pain.   . ranolazine (RANEXA) 500 MG 12 hr tablet Take 500 mg by mouth 2 (two) times  daily.   . sertraline (ZOLOFT) 100 MG tablet Take 1 tablet (100 mg total) by mouth daily. 02/20/2019: Not currently taking  . simvastatin (ZOCOR) 40 MG tablet Take 40 mg by mouth daily.   . Vitamin D, Ergocalciferol, (DRISDOL) 1.25 MG (50000 UNIT) CAPS capsule Take 1 capsule (50,000 Units total) by mouth every 7 (seven) days.   Marland Kitchen zolpidem (AMBIEN) 10 MG tablet Take 1 tablet (10 mg total) by mouth at bedtime.    No facility-administered encounter medications on file as of 11/13/2019.    Surgical History: Past Surgical History:  Procedure Laterality Date  . ABDOMINAL HYSTERECTOMY    . CARDIAC CATHETERIZATION    . CARDIAC CATHETERIZATION N/A 10/18/2015   Procedure: Left Heart Cath and Cors/Grafts Angiography;  Surgeon: Lorretta Harp, MD;  Location: [place] [facility];  Service: Cardiovascular;  Laterality: N/A;  . CARDIAC CATHETERIZATION N/A 10/18/2015   Procedure: Coronary Stent Intervention;  Surgeon: Lorretta Harp, MD;  Location: [facility] [facility];  Service: Cardiovascular;  Laterality: N/A;  . CARDIAC SURGERY    . CHOLECYSTECTOMY    . CORONARY ANGIOPLASTY    . CORONARY ARTERY BYPASS GRAFT     4 vessels - 2010  . CORONARY STENT INTERVENTION N/A 02/16/2017   Procedure: CORONARY STENT INTERVENTION;  Surgeon: Yolonda Kida, MD;  Location: [facility];  Service: Cardiovascular;  Laterality: N/A;  . CORONARY STENT INTERVENTION N/A 02/13/2019   Procedure: CORONARY STENT INTERVENTION;  Surgeon: Nelva Bush, MD;  Location: [facility];  Service: Cardiovascular;  Laterality: N/A;  SVG to RCA  . CORONARY STENT INTERVENTION Left 03/08/2019   Procedure: CORONARY STENT INTERVENTION;  Surgeon: Yolonda Kida, MD;  Location: [facility];  Service: Cardiovascular;  Laterality: Left;  . LEFT HEART CATH AND CORONARY ANGIOGRAPHY Left 02/16/2017   Procedure: LEFT HEART CATH AND CORONARY ANGIOGRAPHY;  Surgeon: Corey Skains, MD;  Location: [facility];  Service: Cardiovascular;  Laterality: Left;  . LEFT HEART CATH AND CORS/GRAFTS ANGIOGRAPHY Left 11/23/2017   Procedure: LEFT HEART CATH AND CORS/GRAFTS ANGIOGRAPHY;  Surgeon: Corey Skains, MD;  Location: [facility];  Service: Cardiovascular;  Laterality: Left;  . LEFT HEART CATH AND CORS/GRAFTS ANGIOGRAPHY N/A 02/13/2019   Procedure: LEFT HEART CATH AND CORS/GRAFTS ANGIOGRAPHY;  Surgeon: Corey Skains, MD;  Location: [facility];  Service: Cardiovascular;  Laterality: N/A;  . LEFT HEART CATH AND CORS/GRAFTS ANGIOGRAPHY N/A 07/03/2019   Procedure: LEFT HEART CATH AND CORS/GRAFTS ANGIOGRAPHY;  Surgeon: Corey Skains, MD;  Location: [facility];  Service: Cardiovascular;  Laterality: N/A;    Medical History: Past Medical History:   Diagnosis Date  . Asthma   . Coronary artery disease   . Diabetes mellitus without complication ([place])   . Heart attack ([place])   . Hyperlipidemia   . Hypertension   . MI (myocardial infarction) ([place])   . Migraine headache with aura   . 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    Family History: Family History  Problem Relation Age of Onset  . Diabetes Mother   . Diabetes Father   . Cancer Father   . Diabetes Brother     Social History   Socioeconomic History  . Marital status: Married    Spouse name: Jeneen Rinks  . Number of children: Not on file  . Years of education: Not on file  . Highest education level: Not on file  Occupational History  .  Not on file  Tobacco Use  . Smoking status: Never Smoker  . Smokeless tobacco: Never Used  Substance and Sexual Activity  . Alcohol use: No    Alcohol/week: 0.0 standard drinks  . Drug use: No  . Sexual activity: Yes  Other Topics Concern  . Not on file  Social History Narrative  . Not on file   Social Determinants of Health   Financial Resource Strain:   . Difficulty of Paying Living Expenses:   Food Insecurity:   . Worried About Charity fundraiser in the Last Year:   . Arboriculturist in the Last Year:   Transportation Needs:   . Film/video editor (Medical):   Marland Kitchen Lack of Transportation (Non-Medical):   Physical Activity:   . Days of Exercise per Week:   . Minutes of Exercise per Session:   Stress:   . Feeling of Stress :   Social Connections:   . Frequency of Communication with Friends and Family:   . Frequency of Social Gatherings with Friends and Family:   . Attends Religious Services:   . Active Member of Clubs or Organizations:   . Attends Archivist Meetings:   Marland Kitchen Marital Status:   Intimate Partner Violence:   . Fear of Current or Ex-Partner:   . Emotionally Abused:   Marland Kitchen Physically Abused:   . Sexually Abused:       Review of Systems  Constitutional: Negative for chills, fatigue and unexpected  weight change.  HENT: Negative for congestion, rhinorrhea, sneezing and sore throat.   Eyes: Negative for photophobia, pain and redness.  Respiratory: Negative for cough, chest tightness and shortness of breath.   Cardiovascular: Negative for chest pain and palpitations.  Gastrointestinal: Negative for abdominal pain, constipation, diarrhea, nausea and vomiting.  Endocrine: Negative.   Genitourinary: Negative for dysuria and frequency.  Musculoskeletal: Negative for arthralgias, back pain, joint swelling and neck pain.  Skin: Negative for rash.  Allergic/Immunologic: Negative.   Neurological: Negative for tremors and numbness.  Hematological: Negative for adenopathy. Does not bruise/bleed easily.  Psychiatric/Behavioral: Negative for behavioral problems and sleep disturbance. The patient is not nervous/anxious.     Vital Signs: BP (!) 155/99   Pulse 77   Temp (!) 97.2 F (36.2 C)   Resp 16   Ht '5\' 7"'  (1.702 m)   Wt 254 lb (115.2 kg)   SpO2 95%   BMI 39.78 kg/m    Physical Exam Vitals and nursing note reviewed.  Constitutional:      General: She is not in acute distress.    Appearance: She is well-developed. She is not diaphoretic.  HENT:     Head: Normocephalic and atraumatic.     Mouth/Throat:     Pharynx: No oropharyngeal exudate.  Eyes:     Pupils: Pupils are equal, round, and reactive to light.  Neck:     Thyroid: No thyromegaly.     Vascular: No JVD.     Trachea: No tracheal deviation.  Cardiovascular:     Rate and Rhythm: Normal rate and regular rhythm.     Heart sounds: Normal heart sounds. No murmur. No friction rub. No gallop.   Pulmonary:     Effort: Pulmonary effort is normal. No respiratory distress.     Breath sounds: Normal breath sounds. No wheezing or rales.  Chest:     Chest wall: No tenderness.  Abdominal:     Palpations: Abdomen is soft.     Tenderness: There  is no abdominal tenderness. There is no guarding.  Musculoskeletal:        General:  Normal range of motion.     Cervical back: Normal range of motion and neck supple.  Lymphadenopathy:     Cervical: No cervical adenopathy.  Skin:    General: Skin is warm and dry.  Neurological:     Mental Status: She is alert and oriented to person, place, and time.     Cranial Nerves: No cranial nerve deficit.  Psychiatric:        Behavior: Behavior normal.        Thought Content: Thought content normal.        Judgment: Judgment normal.    Assessment/Plan: 1. Uncontrolled type 2 diabetes mellitus with hyperglycemia (H[facility]) A1C is stable, 6.4 today.  Continue current management.  - POCT HgB A1C  2. Essential hypertension Continue present management.   3. Encounter for screening colonoscopy Referral for colonoscopy.  - Ambulatory referral to Gastroenterology  4. Vitamin D deficiency Take Vit D as instructed.   General Counseling: Reghan verbalizes understanding of the findings of todays visit and agrees with plan of treatment. I have discussed any further diagnostic evaluation that may be needed or ordered today. We also reviewed her medications today. she has been encouraged to call the office with any questions or concerns that should arise related to todays visit.    Orders Placed This Encounter  Procedures  . Ambulatory referral to Gastroenterology  . POCT HgB A1C    No orders of the defined types were placed in this encounter.   Time spent: 30 Minutes   This patient was seen by Orson Gear AGNP-C in Collaboration with Dr Lavera Guise as a part of collaborative care agreement     Kendell Bane AGNP-C Internal medicine

## 2019-11-13 NOTE — Progress Notes (Signed)
Take Vit D once weekly.  Sent RX to pharmacy.

## 2019-11-13 NOTE — Telephone Encounter (Signed)
Pt had appt today adam review labs with her at visit

## 2019-11-13 NOTE — Telephone Encounter (Signed)
-----   Message from Kendell Bane, NP sent at 11/13/2019  1:51 PM EDT ----- Dian Situ for Vit D to pharmacy.  Pts level is low at 14. Take vit D weekly for 8 weeks.  ----- Message ----- From: Lavone Neri Lab Results In Sent: 11/10/2019   5:37 AM EDT To: Kendell Bane, NP

## 2019-11-19 ENCOUNTER — Telehealth (INDEPENDENT_AMBULATORY_CARE_PROVIDER_SITE_OTHER): Payer: Self-pay | Admitting: Gastroenterology

## 2019-11-19 ENCOUNTER — Other Ambulatory Visit: Payer: Self-pay

## 2019-11-19 ENCOUNTER — Encounter: Payer: Self-pay | Admitting: Gastroenterology

## 2019-11-19 DIAGNOSIS — Z1211 Encounter for screening for malignant neoplasm of colon: Secondary | ICD-10-CM

## 2019-11-19 NOTE — Progress Notes (Signed)
Gastroenterology Pre-Procedure Review  Request Date: Thursday 12/20/19 Requesting Physician: Dr. Marius Ditch  PATIENT REVIEW QUESTIONS: The patient responded to the following health history questions as indicated:    1. Are you having any GI issues? no 2. Do you have a personal history of Polyps? no 3. Do you have a family history of Colon Cancer or Polyps? yes (father colon cancer (deceased)) 30. Diabetes Mellitus? yes (type 2) 5. Joint replacements in the past 12 months?no 6. Major health problems in the past 3 months?no 7. Any artificial heart valves, MVP, or defibrillator?no    MEDICATIONS & ALLERGIES:    Patient reports the following regarding taking any anticoagulation/antiplatelet therapy:   Plavix, Coumadin, Eliquis, Xarelto, Lovenox, Pradaxa, Brilinta, or Effient? yes (Plavix prescribed by Dr. Nehemiah Massed) Aspirin? yes (81 mg daily)  Patient confirms/reports the following medications:  Current Outpatient Medications  Medication Sig Dispense Refill  . Accu-Chek Softclix Lancets lancets Use as instructed once  DAILY DIAG -E11.9 100 each 3  . albuterol (PROVENTIL HFA;VENTOLIN HFA) 108 (90 Base) MCG/ACT inhaler Inhale 2 puffs into the lungs every 6 (six) hours as needed for wheezing or shortness of breath.    Marland Kitchen amLODipine (NORVASC) 10 MG tablet Take 1 tablet (10 mg total) by mouth daily. 90 tablet 1  . aspirin EC 81 MG tablet Take 81 mg by mouth daily.    Marland Kitchen atorvastatin (LIPITOR) 80 MG tablet Take 80 mg by mouth daily.    . bisoprolol-hydrochlorothiazide (ZIAC) 5-6.25 MG tablet Take 1 tablet by mouth daily. 90 tablet 2  . clopidogrel (PLAVIX) 75 MG tablet Take 1 tablet (75 mg total) by mouth daily. 90 tablet 3  . cyclobenzaprine (FLEXERIL) 10 MG tablet Take 1 tablet (10 mg total) by mouth 3 (three) times daily as needed for muscle spasms. 30 tablet 0  . furosemide (LASIX) 40 MG tablet Take 1 tablet (40 mg total) by mouth 2 (two) times daily. 180 tablet 1  . glimepiride (AMARYL) 1 MG tablet  Take 1 tablet (1 mg total) by mouth daily with breakfast. 90 tablet 2  . glucose blood (ACCU-CHEK AVIVA PLUS) test strip 1 each by Other route as needed for other. Use as instructed once  DAILY DIAG -E11.9 100 each 3  . losartan (COZAAR) 100 MG tablet Take 1 tablet (100 mg total) by mouth daily. Take one tab po qd 90 tablet 1  . meloxicam (MOBIC) 15 MG tablet Take 1 tablet (15 mg total) by mouth daily. 30 tablet 0  . methocarbamol (ROBAXIN) 500 MG tablet Take 1 tablet (500 mg total) by mouth 4 (four) times daily. 16 tablet 0  . nitroGLYCERIN (NITROSTAT) 0.4 MG SL tablet Place 0.4 mg under the tongue every 5 (five) minutes x 3 doses as needed for chest pain. *If no relief call MD or go to Emergency Room*    . simvastatin (ZOCOR) 40 MG tablet Take 40 mg by mouth daily.    . traMADol (ULTRAM) 50 MG tablet Take 50 mg by mouth every 6 (six) hours.    . Vitamin D, Ergocalciferol, (DRISDOL) 1.25 MG (50000 UNIT) CAPS capsule Take 1 capsule (50,000 Units total) by mouth every 7 (seven) days. 8 capsule 0  . zolpidem (AMBIEN) 10 MG tablet Take 1 tablet (10 mg total) by mouth at bedtime. 90 tablet 1  . Dulaglutide (TRULICITY) 1.5 0000000 SOPN Inject 1.5 mg into the skin once a week. (Patient not taking: Reported on 11/19/2019) 12 pen 2  . HYDROcodone-acetaminophen (NORCO/VICODIN) 5-325 MG tablet Take 1  tablet by mouth every 4 (four) hours as needed for moderate pain. (Patient not taking: Reported on 11/19/2019) 20 tablet 0  . influenza vac recom quadrivalent (FLUBLOK QUADRIVALENT) 0.5 ML injection Flublok Quad 2020-2021 (PF) 180 mcg (45 mcg x 4)/0.5 mL IM syringe  PHARMACIST ADMINISTERED IMMUNIZATION ADMINISTERED AT TIME OF DISPENSING    . isosorbide mononitrate (IMDUR) 30 MG 24 hr tablet Take 1 tablet (30 mg total) by mouth daily. 90 tablet 4  . montelukast (SINGULAIR) 10 MG tablet Take 1 tablet (10 mg total) by mouth at bedtime. (Patient not taking: Reported on 11/19/2019) 90 tablet 1  . oxybutynin (DITROPAN) 5  MG tablet Take 1 tablet (5 mg total) by mouth daily after supper. (Patient not taking: Reported on 11/19/2019) 90 tablet 1  . oxyCODONE-acetaminophen (PERCOCET) 5-325 MG tablet Take 1-2 tablets by mouth every 8 (eight) hours as needed for severe pain. (Patient not taking: Reported on 11/19/2019) 15 tablet 0  . ranolazine (RANEXA) 500 MG 12 hr tablet Take 500 mg by mouth 2 (two) times daily.    . sertraline (ZOLOFT) 100 MG tablet Take 1 tablet (100 mg total) by mouth daily. 90 tablet 3   No current facility-administered medications for this visit.    Patient confirms/reports the following allergies:  Allergies  Allergen Reactions  . Penicillin G Anaphylaxis and Other (See Comments)    Has patient had a PCN reaction causing immediate rash, facial/tongue/throat swelling, SOB or lightheadedness with hypotension: [id number] Has patient had a PCN reaction causing severe rash involving mucus membranes or skin necrosis: no:[id number]} Has patient had a PCN reaction that required hospitalization no:[id number]} Has patient had a PCN reaction occurring within the last 10 years: no:[id number]} If all of the above answers are "NO", then may proceed with Cephalosporin use.    No orders of the defined types were placed in this encounter.   AUTHORIZATION INFORMATION Primary Insurance: 1D#: Group #:  Secondary Insurance: 1D#: Group #:  SCHEDULE INFORMATION: Date:  Time: Location:

## 2019-11-20 ENCOUNTER — Telehealth: Payer: Self-pay

## 2019-11-20 NOTE — Telephone Encounter (Signed)
Patient has been advised to stop Eliquis  Per Dr. Alveria Apley blood thinner advice 3 days prior to colonoscopy, and to restart 2 days after procedure.  Patient verbalized understanding.  Thanks,  [place], [place]

## 2019-12-10 ENCOUNTER — Ambulatory Visit: Payer: Medicare Other | Admitting: Adult Health

## 2019-12-13 DIAGNOSIS — M25551 Pain in right hip: Secondary | ICD-10-CM | POA: Diagnosis not present

## 2019-12-18 ENCOUNTER — Other Ambulatory Visit: Admission: RE | Admit: 2019-12-18 | Payer: Medicare Other | Source: Ambulatory Visit

## 2019-12-19 ENCOUNTER — Telehealth: Payer: Self-pay

## 2019-12-19 NOTE — Telephone Encounter (Signed)
Patient canceled colonoscopy for 2020/01/03 because she had a death in family. Patient will call back and rescheduled. Informed Trish in Endo

## 2019-12-20 ENCOUNTER — Encounter: Admission: RE | Payer: Self-pay | Source: Home / Self Care

## 2019-12-20 ENCOUNTER — Ambulatory Visit: Admission: RE | Admit: 2019-12-20 | Payer: Medicare Other | Source: Home / Self Care | Admitting: Gastroenterology

## 2019-12-20 SURGERY — COLONOSCOPY WITH PROPOFOL
Anesthesia: General

## 2019-12-25 ENCOUNTER — Other Ambulatory Visit: Payer: Self-pay | Admitting: Adult Health

## 2019-12-25 DIAGNOSIS — E1165 Type 2 diabetes mellitus with hyperglycemia: Secondary | ICD-10-CM

## 2020-01-09 ENCOUNTER — Other Ambulatory Visit (HOSPITAL_COMMUNITY)
Admission: RE | Admit: 2020-01-09 | Discharge: 2020-01-09 | Disposition: A | Discharge status: Home / Self Care | Payer: Medicare Other | Source: Ambulatory Visit | Attending: Advanced Practice Midwife | Admitting: Advanced Practice Midwife

## 2020-01-09 ENCOUNTER — Encounter: Payer: Self-pay | Admitting: Advanced Practice Midwife

## 2020-01-09 ENCOUNTER — Other Ambulatory Visit: Payer: Self-pay

## 2020-01-09 ENCOUNTER — Ambulatory Visit (INDEPENDENT_AMBULATORY_CARE_PROVIDER_SITE_OTHER): Payer: Medicare Other | Admitting: Advanced Practice Midwife

## 2020-01-09 VITALS — BP 122/74 | Ht 67.0 in | Wt 250.0 lb

## 2020-01-09 DIAGNOSIS — Z0000 Encounter for general adult medical examination without abnormal findings: Secondary | ICD-10-CM

## 2020-01-09 DIAGNOSIS — N898 Other specified noninflammatory disorders of vagina: Secondary | ICD-10-CM | POA: Insufficient documentation

## 2020-01-09 NOTE — Progress Notes (Addendum)
Gynecology Annual Exam  PCP: Lavera Guise, MD  Chief Complaint:  Chief Complaint  Patient presents with  . Annual Exam    History of Present Illness:Patient is a 64 y.o. No obstetric history on file. presents for annual exam. The patient has complaint today of vaginal odor for the past 2 weeks. She is unable to describe the odor. It is not fishy. She has changed soap to Caress from [employer] which is less irritating. She uses [employer] detergent. She denies discharge, irritation, itching. She does not douche.   LMP: No LMP recorded. Patient has had a hysterectomy.  Postcoital Bleeding: no Dysmenorrhea: not applicable  The patient is sexually active. She denies dyspareunia.  The patient does perform self breast exams.  There is possible notable family history of breast or ovarian cancer in her family. Her mother had breast cancer and she is uncertain at what age. Myriad information given to patient so she can call.   The patient wears seatbelts: yes.   The patient has regular exercise: she walks 4 or 5 times around her yard. She has exercise limitations per her cardiologist. She eats a low sodium diet. She drinks water and gingerale. She admits adequate sleep.    The patient denies current symptoms of depression.     Review of Systems: Review of Systems  Constitutional: Negative for chills and fever.  HENT: Negative for congestion, ear discharge, ear pain, hearing loss, sinus pain and sore throat.   Eyes: Negative for blurred vision and double vision.  Respiratory: Negative for cough, shortness of breath and wheezing.   Cardiovascular: Negative for chest pain, palpitations and leg swelling.  Gastrointestinal: Negative for abdominal pain, blood in stool, constipation, diarrhea, heartburn, melena, nausea and vomiting.  Genitourinary: Negative for dysuria, flank pain, frequency, hematuria and urgency.       Positive for vaginal odor  Musculoskeletal: Negative for back pain, joint  pain and myalgias.  Skin: Negative for itching and rash.  Neurological: Negative for dizziness, tingling, tremors, sensory change, speech change, focal weakness, seizures, loss of consciousness, weakness and headaches.  Endo/Heme/Allergies: Negative for environmental allergies. Does not bruise/bleed easily.  Psychiatric/Behavioral: Negative for depression, hallucinations, memory loss, substance abuse and suicidal ideas. The patient is not nervous/anxious and does not have insomnia.     Past Medical History:  Patient Active Problem List   Diagnosis Date Noted  . Unstable angina ([place]) 02/13/2019  . Acute bilateral low back pain without sciatica 08/21/2018  . Asthma 08/21/2018  . Coronary artery disease involving coronary bypass graft of native heart without angina pectoris 08/21/2018  . Depression, major, single episode, in partial remission ([place]) 08/21/2018  . Chest pain 11/02/2017  . S/P drug eluting coronary stent placement 02/16/2017  . Numbness and tingling 12/01/2016  . Chronic tension-type headache, intractable 12/01/2016  . Osteoarthritis of knee 11/12/2016  . Obesity, Class II, BMI 35-39.9 05/13/2016  . Hx of CABG 2010 11/12/2015    Formatting of this note might be different from the original. Last Assessment & Plan:  .   Marland Kitchen Type 2 diabetes mellitus with circulatory disorder, without long-term current use of insulin ([place]) 11/12/2015  . Morbid obesity ([facility]) 11/12/2015    BMI 40   . SOBOE (shortness of breath on exertion) 11/12/2015  . RBBB 11/12/2015  . ST elevation myocardial infarction (STEMI) of inferior wall ([facility]) 11/03/2015    Overview:  Due to occuded rca graft now opened with stent 10/2015   . SVG-RCA throbectomy  and DES 10/18/15   . Hypertensive heart disease without heart failure   . Vertigo   . STEMI 10/18/15 10/18/2015  . NSTEMI (non-ST elevated myocardial infarction) ([facility]) 10/18/2015    Formatting of this note might be different from the original. Due to  occuded rca graft now opened with stent 10/2015 and graft to D1 2020 Formatting of this note might be different from the original. Last Assessment & Plan:  .   . Intractable chronic migraine without aura and without status migrainosus 05/08/2015  . Benign essential HTN 03/17/2015  . Acute on chronic diastolic CHF (congestive heart failure), NYHA class 3 (HCC) 08/26/2014  . CAD (coronary artery disease) 08/26/2014    Overview:  Sp cabg 2010 with svg to rca and om2 and lima to lad  Sp pci and stent of rca and om2 graft 2018  Formatting of this note might be different from the original. Sp cabg 2010 with svg to rca and om2 and lima to lad  Sp pci and stent of rca and om2 graft 2018 Overview:  Sp cabg 2010 with svg to rca and om2 and lima to lad  Sp pci and stent of rca and om2 graft 2018 Formatting of this note might be different from the original. Sp cabg 2010 with svg to rca and D1 om2 and lima to lad  Sp pci and stent of rca and om2 graft 2018 Occluded svg to rca and stent in om1 graft ostium 03/2019 occlusion of graft to D1 06/2019   . Headache 08/06/2014  . Mixed hyperlipidemia 07/30/2014    Past Surgical History:  Past Surgical History:  Procedure Laterality Date  . ABDOMINAL HYSTERECTOMY    . CARDIAC CATHETERIZATION    . CARDIAC CATHETERIZATION N/A 10/18/2015   Procedure: Left Heart Cath and Cors/Grafts Angiography;  Surgeon: Jonathan J Berry, MD;  Location: [facility];  Service: Cardiovascular;  Laterality: N/A;  . CARDIAC CATHETERIZATION N/A 10/18/2015   Procedure: Coronary Stent Intervention;  Surgeon: Jonathan J Berry, MD;  Location: [facility];  Service: Cardiovascular;  Laterality: N/A;  . CARDIAC SURGERY    . CHOLECYSTECTOMY    . CORONARY ANGIOPLASTY    . CORONARY ARTERY BYPASS GRAFT     4 vessels - 2010  . CORONARY STENT INTERVENTION N/A 02/16/2017   Procedure: CORONARY STENT INTERVENTION;  Surgeon: Callwood, Dwayne D, MD;  Location: [facility];   Service: Cardiovascular;  Laterality: N/A;  . CORONARY STENT INTERVENTION N/A 02/13/2019   Procedure: CORONARY STENT INTERVENTION;  Surgeon: End, Christopher, MD;  Location: [facility];  Service: Cardiovascular;  Laterality: N/A;  SVG to RCA  . CORONARY STENT INTERVENTION Left 03/08/2019   Procedure: CORONARY STENT INTERVENTION;  Surgeon: Callwood, Dwayne D, MD;  Location: [facility];  Service: Cardiovascular;  Laterality: Left;  . LEFT HEART CATH AND CORONARY ANGIOGRAPHY Left 02/16/2017   Procedure: LEFT HEART CATH AND CORONARY ANGIOGRAPHY;  Surgeon: Kowalski, Bruce J, MD;  Location: [facility];  Service: Cardiovascular;  Laterality: Left;  . LEFT HEART CATH AND CORS/GRAFTS ANGIOGRAPHY Left 11/23/2017   Procedure: LEFT HEART CATH AND CORS/GRAFTS ANGIOGRAPHY;  Surgeon: Kowalski, Bruce J, MD;  Location: [facility];  Service: Cardiovascular;  Laterality: Left;  . LEFT HEART CATH AND CORS/GRAFTS ANGIOGRAPHY N/A 02/13/2019   Procedure: LEFT HEART CATH AND CORS/GRAFTS ANGIOGRAPHY;  Surgeon: Kowalski, Bruce J, MD;  Location: [facility];  Service: Cardiovascular;  Laterality: N/A;  . LEFT   HEART CATH AND CORS/GRAFTS ANGIOGRAPHY N/A 07/03/2019   Procedure: LEFT HEART CATH AND CORS/GRAFTS ANGIOGRAPHY;  Surgeon: Kowalski, Bruce J, MD;  Location: [facility];  Service: Cardiovascular;  Laterality: N/A;    Gynecologic History:  No LMP recorded. Patient has had a hysterectomy. Last Pap: Results were:  no abnormalities  Last mammogram: 1 year ago Results were: BI-RAD I per patient   Obstetric History: No obstetric history on file.  Family History:  Family History  Problem Relation Age of Onset  . Diabetes Mother   . Diabetes Father   . Cancer Father   . Diabetes Brother     Social History:  Social History   Socioeconomic History  . Marital status: Married    Spouse name: james  . Number of children: Not on file  . Years of education: Not on  file  . Highest education level: Not on file  Occupational History  . Not on file  Tobacco Use  . Smoking status: Never Smoker  . Smokeless tobacco: Never Used  Vaping Use  . Vaping Use: Never used  Substance and Sexual Activity  . Alcohol use: No    Alcohol/week: 0.0 standard drinks  . Drug use: No  . Sexual activity: Yes  Other Topics Concern  . Not on file  Social History Narrative  . Not on file   Social Determinants of Health   Financial Resource Strain:   . Difficulty of Paying Living Expenses:   Food Insecurity:   . Worried About Running Out of Food in the Last Year:   . Ran Out of Food in the Last Year:   Transportation Needs:   . Lack of Transportation (Medical):   . Lack of Transportation (Non-Medical):   Physical Activity:   . Days of Exercise per Week:   . Minutes of Exercise per Session:   Stress:   . Feeling of Stress :   Social Connections:   . Frequency of Communication with Friends and Family:   . Frequency of Social Gatherings with Friends and Family:   . Attends Religious Services:   . Active Member of Clubs or Organizations:   . Attends Club or Organization Meetings:   . Marital Status:   Intimate Partner Violence:   . Fear of Current or Ex-Partner:   . Emotionally Abused:   . Physically Abused:   . Sexually Abused:     Allergies:  Allergies  Allergen Reactions  . Penicillin G Anaphylaxis and Other (See Comments)    Has patient had a PCN reaction causing immediate rash, facial/tongue/throat swelling, SOB or lightheadedness with hypotension: yes:[id number]} Has patient had a PCN reaction causing severe rash involving mucus membranes or skin necrosis: no:[id number]} Has patient had a PCN reaction that required hospitalization no:[id number]} Has patient had a PCN reaction occurring within the last 10 years: no:[id number]} If all of the above answers are "NO", then may proceed with Cephalosporin use.    Medications: Prior to Admission medications     Medication Sig Start Date End Date Taking? Authorizing Provider  Accu-Chek Softclix Lancets lancets Use as instructed once  DAILY DIAG -E11.9 08/20/19   Scarboro, Adam J, NP  albuterol (PROVENTIL HFA;VENTOLIN HFA) 108 (90 Base) MCG/ACT inhaler Inhale 2 puffs into the lungs every 6 (six) hours as needed for wheezing or shortness of breath.    [provider]  amLODipine (NORVASC) 10 MG tablet Take 1 tablet (10 mg total) by mouth daily. 08/20/19   Scarboro, Adam J, NP  aspirin   EC 81 MG tablet Take 81 mg by mouth daily.    [provider]  atorvastatin (LIPITOR) 80 MG tablet Take 80 mg by mouth daily. 06/07/19   [provider]  bisoprolol-hydrochlorothiazide (ZIAC) 5-6.25 MG tablet Take 1 tablet by mouth daily. 08/20/19   Scarboro, Adam J, NP  clopidogrel (PLAVIX) 75 MG tablet Take 1 tablet (75 mg total) by mouth daily. 08/20/19   Scarboro, Adam J, NP  cyclobenzaprine (FLEXERIL) 10 MG tablet Take 1 tablet (10 mg total) by mouth 3 (three) times daily as needed for muscle spasms. 08/20/19   Scarboro, Adam J, NP  furosemide (LASIX) 40 MG tablet Take 1 tablet (40 mg total) by mouth 2 (two) times daily. 08/20/19   Scarboro, Adam J, NP  glimepiride (AMARYL) 1 MG tablet Take 1 tablet (1 mg total) by mouth daily with breakfast. 08/20/19   Scarboro, Adam J, NP  glucose blood (ACCU-CHEK AVIVA PLUS) test strip 1 each by Other route as needed for other. Use as instructed once  DAILY DIAG -E11.9 08/20/19   Scarboro, Adam J, NP  influenza vac recom quadrivalent (FLUBLOK QUADRIVALENT) 0.5 ML injection Flublok Quad 2020-2021 (PF) 180 mcg (45 mcg x 4)/0.5 mL IM syringe  PHARMACIST ADMINISTERED IMMUNIZATION ADMINISTERED AT TIME OF DISPENSING    [provider]  isosorbide mononitrate (IMDUR) 30 MG 24 hr tablet Take 1 tablet (30 mg total) by mouth daily. 03/10/19   Callwood, Dwayne D, MD  losartan (COZAAR) 100 MG tablet Take 1 tablet (100 mg total) by mouth daily. Take one tab po qd 08/20/19    Scarboro, Adam J, NP  meloxicam (MOBIC) 15 MG tablet Take 1 tablet (15 mg total) by mouth daily. 10/16/19   Cuthriell, Jonathan D, PA-C  methocarbamol (ROBAXIN) 500 MG tablet Take 1 tablet (500 mg total) by mouth 4 (four) times daily. 10/16/19   Cuthriell, Jonathan D, PA-C  nitroGLYCERIN (NITROSTAT) 0.4 MG SL tablet Place 0.4 mg under the tongue every 5 (five) minutes x 3 doses as needed for chest pain. *If no relief call MD or go to Emergency Room*    [provider]  ranolazine (RANEXA) 500 MG 12 hr tablet Take 500 mg by mouth 2 (two) times daily. 06/07/19   [provider]  sertraline (ZOLOFT) 100 MG tablet Take 1 tablet (100 mg total) by mouth daily. 09/08/18   Scarboro, Adam J, NP  simvastatin (ZOCOR) 40 MG tablet Take 40 mg by mouth daily.    [provider]  traMADol (ULTRAM) 50 MG tablet Take 50 mg by mouth every 6 (six) hours. 11/08/19   [provider]  TRULICITY 1.5 MG/0.5ML SOPN INJECT 1.5MG INTO THE SKIN ONCE A WEEK 12/26/19   Scarboro, Adam J, NP  Vitamin D, Ergocalciferol, (DRISDOL) 1.25 MG (50000 UNIT) CAPS capsule Take 1 capsule (50,000 Units total) by mouth every 7 (seven) days. 11/13/19   Scarboro, Adam J, NP  zolpidem (AMBIEN) 10 MG tablet Take 1 tablet (10 mg total) by mouth at bedtime. 08/20/19   Scarboro, Adam J, NP    Physical Exam Vitals: Blood pressure 122/74, height 5' 7" (1.702 m), weight 250 lb (113.4 kg).  General: NAD HEENT: normocephalic, anicteric Thyroid: no enlargement, no palpable nodules Pulmonary: No increased work of breathing, CTAB Cardiovascular: RRR, distal pulses 2+ Breast: Breast symmetrical, no tenderness, no palpable nodules or masses, no skin or nipple retraction present, no nipple discharge.  No axillary or supraclavicular lymphadenopathy. Abdomen: NABS, soft, non-tender, non-distended.  Umbilicus without lesions.    No hepatomegaly, splenomegaly or masses palpable. No evidence of hernia  Genitourinary: exam limited by  body habitus  External: Normal external female genitalia.  Normal urethral meatus, normal Bartholin's and Skene's glands.    Vagina: Normal vaginal mucosa, no evidence of prolapse.    Cervix: surgically absent  Uterus: surgically absent  Adnexa: surgically absent  Rectal/Anal: liquid stool on skin around anus  Lymphatic: no evidence of inguinal lymphadenopathy Extremities: no edema, erythema, or tenderness Neurologic: Grossly intact Psychiatric: mood appropriate, affect full     Assessment: 64 y.o. No obstetric history on file. routine annual exam, fecal incontinence (possible source of vaginal odor)  Plan: Problem List Items Addressed This Visit    None    Visit Diagnoses    Well woman exam without gynecological exam    -  Primary   Vaginal odor       Relevant Orders   Cervicovaginal ancillary only      1) Mammogram - recommend yearly screening mammogram.  Mammogram Is up to date  2) STI screening  was offered and declined  3) ASCCP guidelines and rationale discussed.  Patient opts for discontinue secondary to prior hysterectomy screening interval  4) Osteoporosis  - per USPTF routine screening DEXA at age 18  Consider FDA-approved medical therapies in postmenopausal women and men aged 54 years and older, based on the following: a) A hip or vertebral (clinical or morphometric) fracture b) T-score ? -2.5 at the femoral neck or spine after appropriate evaluation to exclude secondary causes C) Low bone mass (T-score between -1.0 and -2.5 at the femoral neck or spine) and a 10-year probability of a hip fracture ? 3% or a 10-year probability of a major osteoporosis-related fracture ? 20% based on the [place]-adapted WHO algorithm   5) Routine healthcare maintenance including cholesterol, diabetes screening discussed managed by PCP  6) Colonoscopy is scheduled for next month.  Screening recommended starting at age 24 for average risk individuals, age 49 for individuals deemed at  increased risk (including African Americans) and recommended to continue until age 71.  For patient age 61-85 individualized approach is recommended.  Gold standard screening is via colonoscopy, Cologuard screening is an acceptable alternative for patient unwilling or unable to undergo colonoscopy.  "Colorectal cancer screening for average?risk adults: 2018 guideline update from the American Cancer Society"[place]: A Cancer Journal for Clinicians: Dec 01, 2016   7) Liquid stool around anus: f/u with PCP- possible medication side effect?/carry wipes for cleanliness  8) F/U as needed after aptima results  9) Return in about 1 year (around 01/08/2021) for annual established gyn.    Christean Leaf, CNM [facility] 01/09/20, 4:15 PM

## 2020-01-14 LAB — CERVICOVAGINAL ANCILLARY ONLY
Bacterial Vaginitis (gardnerella): NEGATIVE
Candida Glabrata: NEGATIVE
Candida Vaginitis: NEGATIVE
Comment: NEGATIVE
Comment: NEGATIVE
Comment: NEGATIVE

## 2020-01-15 ENCOUNTER — Other Ambulatory Visit: Payer: Self-pay | Admitting: Adult Health

## 2020-01-16 DIAGNOSIS — Z7984 Long term (current) use of oral hypoglycemic drugs: Secondary | ICD-10-CM | POA: Diagnosis not present

## 2020-01-16 DIAGNOSIS — H01022 Squamous blepharitis right lower eyelid: Secondary | ICD-10-CM | POA: Diagnosis not present

## 2020-01-16 DIAGNOSIS — H524 Presbyopia: Secondary | ICD-10-CM | POA: Diagnosis not present

## 2020-01-16 DIAGNOSIS — H2513 Age-related nuclear cataract, bilateral: Secondary | ICD-10-CM | POA: Diagnosis not present

## 2020-01-16 DIAGNOSIS — H52223 Regular astigmatism, bilateral: Secondary | ICD-10-CM | POA: Diagnosis not present

## 2020-01-16 DIAGNOSIS — H5203 Hypermetropia, bilateral: Secondary | ICD-10-CM | POA: Diagnosis not present

## 2020-01-16 DIAGNOSIS — E119 Type 2 diabetes mellitus without complications: Secondary | ICD-10-CM | POA: Diagnosis not present

## 2020-01-17 ENCOUNTER — Ambulatory Visit: Payer: Medicare Other

## 2020-01-17 ENCOUNTER — Other Ambulatory Visit: Payer: Self-pay

## 2020-01-17 VITALS — BP 168/84

## 2020-01-17 DIAGNOSIS — I10 Essential (primary) hypertension: Secondary | ICD-10-CM

## 2020-01-31 ENCOUNTER — Telehealth: Payer: Self-pay

## 2020-01-31 NOTE — Telephone Encounter (Signed)
Confirmed and screened for 02-04-20 ov. °

## 2020-02-04 ENCOUNTER — Encounter: Payer: Self-pay | Admitting: Internal Medicine

## 2020-02-04 ENCOUNTER — Ambulatory Visit (INDEPENDENT_AMBULATORY_CARE_PROVIDER_SITE_OTHER): Payer: Medicaid Other | Admitting: Hospice and Palliative Medicine

## 2020-02-04 ENCOUNTER — Other Ambulatory Visit: Payer: Self-pay

## 2020-02-04 DIAGNOSIS — J302 Other seasonal allergic rhinitis: Secondary | ICD-10-CM

## 2020-02-04 DIAGNOSIS — Z955 Presence of coronary angioplasty implant and graft: Secondary | ICD-10-CM

## 2020-02-04 DIAGNOSIS — N3281 Overactive bladder: Secondary | ICD-10-CM | POA: Diagnosis not present

## 2020-02-04 DIAGNOSIS — F324 Major depressive disorder, single episode, in partial remission: Secondary | ICD-10-CM

## 2020-02-04 DIAGNOSIS — G4709 Other insomnia: Secondary | ICD-10-CM

## 2020-02-04 DIAGNOSIS — G43909 Migraine, unspecified, not intractable, without status migrainosus: Secondary | ICD-10-CM | POA: Diagnosis not present

## 2020-02-04 DIAGNOSIS — I10 Essential (primary) hypertension: Secondary | ICD-10-CM | POA: Diagnosis not present

## 2020-02-04 DIAGNOSIS — E1165 Type 2 diabetes mellitus with hyperglycemia: Secondary | ICD-10-CM

## 2020-02-04 DIAGNOSIS — M542 Cervicalgia: Secondary | ICD-10-CM

## 2020-02-04 MED ORDER — OXYBUTYNIN CHLORIDE ER 5 MG PO TB24: 5.0000 mg | ORAL_TABLET | Freq: Every day | ORAL | 2 refills | Status: DC

## 2020-02-04 MED ORDER — ZOLPIDEM TARTRATE 10 MG PO TABS: 10.0000 mg | ORAL_TABLET | Freq: Every day | ORAL | 1 refills | Status: DC

## 2020-02-04 MED ORDER — GLIMEPIRIDE 1 MG PO TABS: 1.0000 mg | ORAL_TABLET | Freq: Every day | ORAL | 2 refills | Status: DC

## 2020-02-04 MED ORDER — MONTELUKAST SODIUM 10 MG PO TABS: 10.0000 mg | ORAL_TABLET | Freq: Every day | ORAL | 3 refills | Status: DC

## 2020-02-04 MED ORDER — AMLODIPINE BESYLATE 10 MG PO TABS: 10.0000 mg | ORAL_TABLET | Freq: Every day | ORAL | 1 refills | Status: DC

## 2020-02-04 MED ORDER — BISOPROLOL-HYDROCHLOROTHIAZIDE 5-6.25 MG PO TABS: 1.0000 | ORAL_TABLET | Freq: Every day | ORAL | 2 refills | Status: DC

## 2020-02-04 MED ORDER — CLOPIDOGREL BISULFATE 75 MG PO TABS: 75.0000 mg | ORAL_TABLET | Freq: Every day | ORAL | 3 refills | Status: DC

## 2020-02-04 MED ORDER — LOSARTAN POTASSIUM 100 MG PO TABS: 100.0000 mg | ORAL_TABLET | Freq: Every day | ORAL | 1 refills | Status: DC

## 2020-02-04 MED ORDER — FUROSEMIDE 40 MG PO TABS: 40.0000 mg | ORAL_TABLET | Freq: Two times a day (BID) | ORAL | 1 refills | Status: DC

## 2020-02-04 MED ORDER — UBRELVY 100 MG PO TABS: 100.0000 mg | ORAL_TABLET | Freq: Every day | ORAL | 0 refills | Status: DC | PRN

## 2020-02-04 MED ORDER — SERTRALINE HCL 100 MG PO TABS: 100.0000 mg | ORAL_TABLET | Freq: Every day | ORAL | 3 refills | Status: DC

## 2020-02-04 MED ORDER — CYCLOBENZAPRINE HCL 10 MG PO TABS: 10.0000 mg | ORAL_TABLET | Freq: Three times a day (TID) | ORAL | 0 refills | Status: DC | PRN

## 2020-02-04 NOTE — Progress Notes (Signed)
[facility] [place], [place] [address]  Internal MEDICINE  Office Visit Note  Patient Name: Felicia Woods  [id number]  [id number]  Date of Service: 02/09/2020  Chief Complaint  Patient presents with  . Migraine    blood pressure check due to migraines    HPI Patient is here today for routine follow-up. Was recently seen for migraines as well as elevated BP. At last visit was given samples of Ubrelvy and reports her headaches were much improved with this. At this time she does not have anymore samples of this. Requesting more samples at this time. BP stable today, denies chest pain, visual disturbances or palpitations. Also requesting refills on other medications.  Current Medication: Outpatient Encounter Medications as of 02/04/2020  Medication Sig Note  . Accu-Chek Softclix Lancets lancets Use as instructed once  DAILY DIAG -E11.9   . albuterol (PROVENTIL HFA;VENTOLIN HFA) 108 (90 Base) MCG/ACT inhaler Inhale 2 puffs into the lungs every 6 (six) hours as needed for wheezing or shortness of breath.   Marland Kitchen amLODipine (NORVASC) 10 MG tablet Take 1 tablet (10 mg total) by mouth daily.   Marland Kitchen aspirin EC 81 MG tablet Take 81 mg by mouth daily.   Marland Kitchen atorvastatin (LIPITOR) 80 MG tablet Take 80 mg by mouth daily.   . bisoprolol-hydrochlorothiazide (ZIAC) 5-6.25 MG tablet Take 1 tablet by mouth daily.   . clopidogrel (PLAVIX) 75 MG tablet Take 1 tablet (75 mg total) by mouth daily.   . cyclobenzaprine (FLEXERIL) 10 MG tablet Take 1 tablet (10 mg total) by mouth 3 (three) times daily as needed for muscle spasms.   . furosemide (LASIX) 40 MG tablet Take 1 tablet (40 mg total) by mouth 2 (two) times daily.   Marland Kitchen glimepiride (AMARYL) 1 MG tablet Take 1 tablet (1 mg total) by mouth daily with breakfast.   . glucose blood (ACCU-CHEK AVIVA PLUS) test strip 1 each by Other route as needed for other. Use as instructed once  DAILY DIAG -E11.9   . influenza vac recom quadrivalent  (FLUBLOK QUADRIVALENT) 0.5 ML injection Flublok Quad 2020-2021 (PF) 180 mcg (45 mcg x 4)/0.5 mL IM syringe  PHARMACIST ADMINISTERED IMMUNIZATION ADMINISTERED AT TIME OF DISPENSING   . isosorbide mononitrate (IMDUR) 30 MG 24 hr tablet Take 1 tablet (30 mg total) by mouth daily.   Marland Kitchen losartan (COZAAR) 100 MG tablet Take 1 tablet (100 mg total) by mouth daily. Take one tab po qd   . meloxicam (MOBIC) 15 MG tablet Take 1 tablet (15 mg total) by mouth daily.   . methocarbamol (ROBAXIN) 500 MG tablet Take 1 tablet (500 mg total) by mouth 4 (four) times daily.   . nitroGLYCERIN (NITROSTAT) 0.4 MG SL tablet Place 0.4 mg under the tongue every 5 (five) minutes x 3 doses as needed for chest pain. *If no relief call MD or go to Emergency Room*   . ranolazine (RANEXA) 500 MG 12 hr tablet Take 500 mg by mouth 2 (two) times daily.   . sertraline (ZOLOFT) 100 MG tablet Take 1 tablet (100 mg total) by mouth daily.   . simvastatin (ZOCOR) 40 MG tablet Take 40 mg by mouth daily.   . traMADol (ULTRAM) 50 MG tablet Take 50 mg by mouth every 6 (six) hours.   . TRULICITY 1.5 XN/2.3FT SOPN INJECT 1.5MG INTO THE SKIN ONCE A WEEK   . Vitamin D, Ergocalciferol, (DRISDOL) 1.25 MG (50000 UNIT) CAPS capsule Take 1 capsule (50,000 Units total) by mouth every  7 (seven) days.   Marland Kitchen zolpidem (AMBIEN) 10 MG tablet Take 1 tablet (10 mg total) by mouth at bedtime.   . [DISCONTINUED] amLODipine (NORVASC) 10 MG tablet Take 1 tablet (10 mg total) by mouth daily.   . [DISCONTINUED] bisoprolol-hydrochlorothiazide (ZIAC) 5-6.25 MG tablet Take 1 tablet by mouth daily.   . [DISCONTINUED] clopidogrel (PLAVIX) 75 MG tablet Take 1 tablet (75 mg total) by mouth daily.   . [DISCONTINUED] cyclobenzaprine (FLEXERIL) 10 MG tablet Take 1 tablet (10 mg total) by mouth 3 (three) times daily as needed for muscle spasms.   . [DISCONTINUED] furosemide (LASIX) 40 MG tablet Take 1 tablet (40 mg total) by mouth 2 (two) times daily.   . [DISCONTINUED]  glimepiride (AMARYL) 1 MG tablet Take 1 tablet (1 mg total) by mouth daily with breakfast.   . [DISCONTINUED] losartan (COZAAR) 100 MG tablet Take 1 tablet (100 mg total) by mouth daily. Take one tab po qd   . [DISCONTINUED] sertraline (ZOLOFT) 100 MG tablet Take 1 tablet (100 mg total) by mouth daily. 02/20/2019: Not currently taking  . [DISCONTINUED] zolpidem (AMBIEN) 10 MG tablet Take 1 tablet (10 mg total) by mouth at bedtime.   . montelukast (SINGULAIR) 10 MG tablet Take 1 tablet (10 mg total) by mouth at bedtime.   Marland Kitchen oxybutynin (DITROPAN-XL) 5 MG 24 hr tablet Take 1 tablet (5 mg total) by mouth daily with supper.   Marland Kitchen Ubrogepant (UBRELVY) 100 MG TABS Take 100 mg by mouth daily as needed.    No facility-administered encounter medications on file as of 02/04/2020.    Surgical History: Past Surgical History:  Procedure Laterality Date  . ABDOMINAL HYSTERECTOMY    . CARDIAC CATHETERIZATION    . CARDIAC CATHETERIZATION N/A 10/18/2015   Procedure: Left Heart Cath and Cors/Grafts Angiography;  Surgeon: Lorretta Harp, MD;  Location: [facility];  Service: Cardiovascular;  Laterality: N/A;  . CARDIAC CATHETERIZATION N/A 10/18/2015   Procedure: Coronary Stent Intervention;  Surgeon: Lorretta Harp, MD;  Location: [facility];  Service: Cardiovascular;  Laterality: N/A;  . CARDIAC SURGERY    . CHOLECYSTECTOMY    . CORONARY ANGIOPLASTY    . CORONARY ARTERY BYPASS GRAFT     4 vessels - 2010  . CORONARY STENT INTERVENTION N/A 02/16/2017   Procedure: CORONARY STENT INTERVENTION;  Surgeon: Yolonda Kida, MD;  Location: [facility];  Service: Cardiovascular;  Laterality: N/A;  . CORONARY STENT INTERVENTION N/A 02/13/2019   Procedure: CORONARY STENT INTERVENTION;  Surgeon: Nelva Bush, MD;  Location: [facility];  Service: Cardiovascular;  Laterality: N/A;  SVG to RCA  . CORONARY STENT INTERVENTION Left 03/08/2019   Procedure: CORONARY STENT INTERVENTION;   Surgeon: Yolonda Kida, MD;  Location: [facility];  Service: Cardiovascular;  Laterality: Left;  . LEFT HEART CATH AND CORONARY ANGIOGRAPHY Left 02/16/2017   Procedure: LEFT HEART CATH AND CORONARY ANGIOGRAPHY;  Surgeon: Corey Skains, MD;  Location: [facility];  Service: Cardiovascular;  Laterality: Left;  . LEFT HEART CATH AND CORS/GRAFTS ANGIOGRAPHY Left 11/23/2017   Procedure: LEFT HEART CATH AND CORS/GRAFTS ANGIOGRAPHY;  Surgeon: Corey Skains, MD;  Location: [facility];  Service: Cardiovascular;  Laterality: Left;  . LEFT HEART CATH AND CORS/GRAFTS ANGIOGRAPHY N/A 02/13/2019   Procedure: LEFT HEART CATH AND CORS/GRAFTS ANGIOGRAPHY;  Surgeon: Corey Skains, MD;  Location: [facility];  Service: Cardiovascular;  Laterality: N/A;  . LEFT HEART  CATH AND CORS/GRAFTS ANGIOGRAPHY N/A 07/03/2019   Procedure: LEFT HEART CATH AND CORS/GRAFTS ANGIOGRAPHY;  Surgeon: Corey Skains, MD;  Location: [facility];  Service: Cardiovascular;  Laterality: N/A;    Medical History: Past Medical History:  Diagnosis Date  . Asthma   . Coronary artery disease   . Diabetes mellitus without complication ([place])   . Heart attack ([place])   . Hyperlipidemia   . Hypertension   . MI (myocardial infarction) ([place])   . Migraine headache with aura   . 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    Family History: Family History  Problem Relation Age of Onset  . Diabetes Mother   . Diabetes Father   . Cancer Father   . Diabetes Brother     Social History   Socioeconomic History  . Marital status: Married    Spouse name: Jeneen Rinks  . Number of children: Not on file  . Years of education: Not on file  . Highest education level: Not on file  Occupational History  . Not on file  Tobacco Use  . Smoking status: Never Smoker  . Smokeless tobacco: Never Used  Vaping Use  . Vaping Use: Never used  Substance and Sexual Activity  . Alcohol use: No     Alcohol/week: 0.0 standard drinks  . Drug use: No  . Sexual activity: Yes  Other Topics Concern  . Not on file  Social History Narrative  . Not on file   Social Determinants of Health   Financial Resource Strain:   . Difficulty of Paying Living Expenses:   Food Insecurity:   . Worried About Charity fundraiser in the Last Year:   . Arboriculturist in the Last Year:   Transportation Needs:   . Film/video editor (Medical):   Marland Kitchen Lack of Transportation (Non-Medical):   Physical Activity:   . Days of Exercise per Week:   . Minutes of Exercise per Session:   Stress:   . Feeling of Stress :   Social Connections:   . Frequency of Communication with Friends and Family:   . Frequency of Social Gatherings with Friends and Family:   . Attends Religious Services:   . Active Member of Clubs or Organizations:   . Attends Archivist Meetings:   Marland Kitchen Marital Status:   Intimate Partner Violence:   . Fear of Current or Ex-Partner:   . Emotionally Abused:   Marland Kitchen Physically Abused:   . Sexually Abused:       Review of Systems  Constitutional: Negative.        For chills, fever, fatigue.  HENT: Negative.        For sinus pain, sinus pressure, sore throat, trouble swallowing.  Eyes: Negative.        For changes in vision or visual disturbances.  Respiratory: Negative.        For chest tightness, cough, shortness of breath, wheezing.  Cardiovascular: Negative.        For chest pain, ankle swelling, palpitations.  Gastrointestinal: Negative.        For abdominal pain, constipation, nausea, vomiting, diarrhea.  Endocrine: Negative.        For polydipsia, polyphagia, polyuria.  Genitourinary: Negative.        For dysuria, flank pain, hematuria, increased frequency, urgency.  Musculoskeletal: Negative.        For arthralgias, myalgias, back pain, neck pain, gait disturbances.  Skin: Negative.  For rash, wound.  Allergic/Immunologic: Negative.   Neurological: Negative.         For dizziness, headaches, tremors, weakness.  Hematological: Negative.   Psychiatric/Behavioral: Negative.        Confusion, depression, anxiety, sleep disturbances.    Vital Signs: BP 134/82   Pulse 68   Temp (!) 97.1 F (36.2 C)   Ht '5\' 7"'  (1.702 m)   Wt 252 lb 3.2 oz (114.4 kg)   SpO2 99%   BMI 39.50 kg/m    Physical Exam Constitutional:      Appearance: Normal appearance. She is obese.  HENT:     Mouth/Throat:     Mouth: Mucous membranes are moist.     Pharynx: Oropharynx is clear.  Cardiovascular:     Rate and Rhythm: Normal rate and regular rhythm.     Pulses: Normal pulses.     Heart sounds: Normal heart sounds.  Pulmonary:     Effort: Pulmonary effort is normal.     Breath sounds: Normal breath sounds.  Abdominal:     General: Abdomen is flat. Bowel sounds are normal.     Palpations: Abdomen is soft.  Musculoskeletal:        General: Normal range of motion.     Cervical back: Normal range of motion.  Skin:    General: Skin is warm and dry.  Neurological:     General: No focal deficit present.     Mental Status: She is alert and oriented to person, place, and time. Mental status is at baseline.  Psychiatric:        Mood and Affect: Mood normal.        Behavior: Behavior normal.        Thought Content: Thought content normal.     Assessment/Plan: 1. Migraine without status migrainosus, not intractable, unspecified migraine type Samples of Ubrelvy as well as Nurtec given at today's appointment. Will send prescription for Ubrelvy to pharmacy so she can check price of medication. Will continue to monitor. - Ubrogepant (UBRELVY) 100 MG TABS; Take 100 mg by mouth daily as needed.  Dispense: 30 tablet; Refill: 0  2. Essential hypertension BP stable today, continue with current therapy. Encouraged to routinely check BP at home especially once headaches occur. Encouraged to keep BP log and bring to next follow-up visit. - bisoprolol-hydrochlorothiazide  (ZIAC) 5-6.25 MG tablet; Take 1 tablet by mouth daily.  Dispense: 90 tablet; Refill: 2 - amLODipine (NORVASC) 10 MG tablet; Take 1 tablet (10 mg total) by mouth daily.  Dispense: 90 tablet; Refill: 1 - furosemide (LASIX) 40 MG tablet; Take 1 tablet (40 mg total) by mouth 2 (two) times daily.  Dispense: 180 tablet; Refill: 1 - losartan (COZAAR) 100 MG tablet; Take 1 tablet (100 mg total) by mouth daily. Take one tab po qd  Dispense: 90 tablet; Refill: 1  3. S/P drug eluting coronary stent placement Stable at this current time, continue with current therapy and continue to follow-up with cardiology. - clopidogrel (PLAVIX) 75 MG tablet; Take 1 tablet (75 mg total) by mouth daily.  Dispense: 90 tablet; Refill: 3  4. Depression, major, single episode, in partial remission ([place]) Stable at this time, continue with current therapy. - sertraline (ZOLOFT) 100 MG tablet; Take 1 tablet (100 mg total) by mouth daily.  Dispense: 90 tablet; Refill: 3  5. Neck pain Stable at this time, continue with current therapy. - cyclobenzaprine (FLEXERIL) 10 MG tablet; Take 1 tablet (10 mg total) by mouth 3 (three)  times daily as needed for muscle spasms.  Dispense: 30 tablet; Refill: 0  6. Other insomnia Stable at this time, continue with current therapy. - zolpidem (AMBIEN) 10 MG tablet; Take 1 tablet (10 mg total) by mouth at bedtime.  Dispense: 90 tablet; Refill: 1  7. Uncontrolled type 2 diabetes mellitus with hyperglycemia (H[facility]) Stable at this time, continue with current therapy. Check A1C level at next follow-up appointment. - glimepiride (AMARYL) 1 MG tablet; Take 1 tablet (1 mg total) by mouth daily with breakfast.  Dispense: 90 tablet; Refill: 2  8. Seasonal allergies Stable at this current time, continue with current therapy. - montelukast (SINGULAIR) 10 MG tablet; Take 1 tablet (10 mg total) by mouth at bedtime.  Dispense: 90 tablet; Refill: 3  9. OAB (overactive bladder) Stable at this time, continue  with current therapy. - oxybutynin (DITROPAN-XL) 5 MG 24 hr tablet; Take 1 tablet (5 mg total) by mouth daily with supper.  Dispense: 90 tablet; Refill: 2  General Counseling: Flynn verbalizes understanding of the findings of todays visit and agrees with plan of treatment. I have discussed any further diagnostic evaluation that may be needed or ordered today. We also reviewed her medications today. she has been encouraged to call the office with any questions or concerns that should arise related to todays visit.   Meds ordered this encounter  Medications  . Ubrogepant (UBRELVY) 100 MG TABS    Sig: Take 100 mg by mouth daily as needed.    Dispense:  30 tablet    Refill:  0  . zolpidem (AMBIEN) 10 MG tablet    Sig: Take 1 tablet (10 mg total) by mouth at bedtime.    Dispense:  90 tablet    Refill:  1  . clopidogrel (PLAVIX) 75 MG tablet    Sig: Take 1 tablet (75 mg total) by mouth daily.    Dispense:  90 tablet    Refill:  3  . sertraline (ZOLOFT) 100 MG tablet    Sig: Take 1 tablet (100 mg total) by mouth daily.    Dispense:  90 tablet    Refill:  3  . bisoprolol-hydrochlorothiazide (ZIAC) 5-6.25 MG tablet    Sig: Take 1 tablet by mouth daily.    Dispense:  90 tablet    Refill:  2  . amLODipine (NORVASC) 10 MG tablet    Sig: Take 1 tablet (10 mg total) by mouth daily.    Dispense:  90 tablet    Refill:  1  . furosemide (LASIX) 40 MG tablet    Sig: Take 1 tablet (40 mg total) by mouth 2 (two) times daily.    Dispense:  180 tablet    Refill:  1  . losartan (COZAAR) 100 MG tablet    Sig: Take 1 tablet (100 mg total) by mouth daily. Take one tab po qd    Dispense:  90 tablet    Refill:  1  . cyclobenzaprine (FLEXERIL) 10 MG tablet    Sig: Take 1 tablet (10 mg total) by mouth 3 (three) times daily as needed for muscle spasms.    Dispense:  30 tablet    Refill:  0  . glimepiride (AMARYL) 1 MG tablet    Sig: Take 1 tablet (1 mg total) by mouth daily with breakfast.     Dispense:  90 tablet    Refill:  2  . montelukast (SINGULAIR) 10 MG tablet    Sig: Take 1 tablet (10 mg total) by mouth  at bedtime.    Dispense:  90 tablet    Refill:  3  . oxybutynin (DITROPAN-XL) 5 MG 24 hr tablet    Sig: Take 1 tablet (5 mg total) by mouth daily with supper.    Dispense:  90 tablet    Refill:  2    Total time spent:20 Minutes  This patient was seen by Theodoro Grist, AGNP-C in collaboration with Dr. Gordy Savers as part of a collaborative care agreement.  Time spent includes review of chart, medications, test results, and follow up plan with the patient.   Theodoro Grist, AGNP-C Internal Medicine  Dr Lavera Guise Internal medicine

## 2020-02-05 DIAGNOSIS — M545 Low back pain: Secondary | ICD-10-CM | POA: Diagnosis not present

## 2020-02-05 DIAGNOSIS — M5416 Radiculopathy, lumbar region: Secondary | ICD-10-CM | POA: Diagnosis not present

## 2020-02-05 DIAGNOSIS — M25561 Pain in right knee: Secondary | ICD-10-CM | POA: Diagnosis not present

## 2020-02-12 DIAGNOSIS — E119 Type 2 diabetes mellitus without complications: Secondary | ICD-10-CM | POA: Diagnosis not present

## 2020-02-12 DIAGNOSIS — H52223 Regular astigmatism, bilateral: Secondary | ICD-10-CM | POA: Diagnosis not present

## 2020-02-12 DIAGNOSIS — H04123 Dry eye syndrome of bilateral lacrimal glands: Secondary | ICD-10-CM | POA: Diagnosis not present

## 2020-02-12 DIAGNOSIS — H01022 Squamous blepharitis right lower eyelid: Secondary | ICD-10-CM | POA: Diagnosis not present

## 2020-02-12 DIAGNOSIS — Z7984 Long term (current) use of oral hypoglycemic drugs: Secondary | ICD-10-CM | POA: Diagnosis not present

## 2020-02-12 DIAGNOSIS — H2513 Age-related nuclear cataract, bilateral: Secondary | ICD-10-CM | POA: Diagnosis not present

## 2020-02-12 DIAGNOSIS — H5203 Hypermetropia, bilateral: Secondary | ICD-10-CM | POA: Diagnosis not present

## 2020-02-12 DIAGNOSIS — H524 Presbyopia: Secondary | ICD-10-CM | POA: Diagnosis not present

## 2020-02-14 ENCOUNTER — Ambulatory Visit: Payer: Medicare Other | Admitting: Adult Health

## 2020-02-19 ENCOUNTER — Telehealth: Payer: Self-pay

## 2020-02-19 NOTE — Telephone Encounter (Signed)
Authorization approved for Ubrelvy 100MG  tablet from 02/05/20 until further notice SL

## 2020-02-20 DIAGNOSIS — H5203 Hypermetropia, bilateral: Secondary | ICD-10-CM | POA: Diagnosis not present

## 2020-02-20 DIAGNOSIS — H04123 Dry eye syndrome of bilateral lacrimal glands: Secondary | ICD-10-CM | POA: Diagnosis not present

## 2020-02-20 DIAGNOSIS — H52223 Regular astigmatism, bilateral: Secondary | ICD-10-CM | POA: Diagnosis not present

## 2020-02-20 DIAGNOSIS — H01022 Squamous blepharitis right lower eyelid: Secondary | ICD-10-CM | POA: Diagnosis not present

## 2020-02-20 DIAGNOSIS — Z7984 Long term (current) use of oral hypoglycemic drugs: Secondary | ICD-10-CM | POA: Diagnosis not present

## 2020-02-20 DIAGNOSIS — H524 Presbyopia: Secondary | ICD-10-CM | POA: Diagnosis not present

## 2020-02-20 DIAGNOSIS — H2513 Age-related nuclear cataract, bilateral: Secondary | ICD-10-CM | POA: Diagnosis not present

## 2020-02-20 DIAGNOSIS — E119 Type 2 diabetes mellitus without complications: Secondary | ICD-10-CM | POA: Diagnosis not present

## 2020-03-04 ENCOUNTER — Telehealth: Payer: Self-pay

## 2020-03-04 DIAGNOSIS — M25561 Pain in right knee: Secondary | ICD-10-CM | POA: Insufficient documentation

## 2020-03-04 DIAGNOSIS — M5416 Radiculopathy, lumbar region: Secondary | ICD-10-CM | POA: Insufficient documentation

## 2020-03-04 NOTE — Telephone Encounter (Signed)
Lmom to confirm and screen for 03-06-20 ov.

## 2020-03-06 ENCOUNTER — Other Ambulatory Visit: Payer: Self-pay

## 2020-03-06 ENCOUNTER — Encounter: Payer: Self-pay | Admitting: Nurse Practitioner

## 2020-03-06 ENCOUNTER — Ambulatory Visit (INDEPENDENT_AMBULATORY_CARE_PROVIDER_SITE_OTHER): Payer: Medicaid Other | Admitting: Nurse Practitioner

## 2020-03-06 VITALS — BP 142/94 | HR 82 | Temp 97.8°F | Resp 16 | Ht 67.0 in | Wt 254.0 lb

## 2020-03-06 DIAGNOSIS — I119 Hypertensive heart disease without heart failure: Secondary | ICD-10-CM | POA: Diagnosis not present

## 2020-03-06 DIAGNOSIS — I700 Atherosclerosis of aorta: Secondary | ICD-10-CM | POA: Diagnosis not present

## 2020-03-06 DIAGNOSIS — Z6839 Body mass index (BMI) 39.0-39.9, adult: Secondary | ICD-10-CM | POA: Diagnosis not present

## 2020-03-06 DIAGNOSIS — I10 Essential (primary) hypertension: Secondary | ICD-10-CM | POA: Diagnosis not present

## 2020-03-06 DIAGNOSIS — G4709 Other insomnia: Secondary | ICD-10-CM | POA: Diagnosis not present

## 2020-03-06 DIAGNOSIS — Z955 Presence of coronary angioplasty implant and graft: Secondary | ICD-10-CM

## 2020-03-06 DIAGNOSIS — M542 Cervicalgia: Secondary | ICD-10-CM | POA: Diagnosis not present

## 2020-03-06 DIAGNOSIS — E1165 Type 2 diabetes mellitus with hyperglycemia: Secondary | ICD-10-CM

## 2020-03-06 DIAGNOSIS — F324 Major depressive disorder, single episode, in partial remission: Secondary | ICD-10-CM | POA: Diagnosis not present

## 2020-03-06 LAB — POCT GLYCOSYLATED HEMOGLOBIN (HGB A1C): Hemoglobin A1C: 6.9 % — AB (ref 4.0–5.6)

## 2020-03-06 MED ORDER — SERTRALINE HCL 100 MG PO TABS: 100.0000 mg | ORAL_TABLET | Freq: Every day | ORAL | 3 refills | Status: DC

## 2020-03-06 MED ORDER — ISOSORBIDE MONONITRATE ER 30 MG PO TB24: 30.0000 mg | ORAL_TABLET | Freq: Every day | ORAL | 0 refills | Status: DC

## 2020-03-06 MED ORDER — LOSARTAN POTASSIUM 100 MG PO TABS: 100.0000 mg | ORAL_TABLET | Freq: Every day | ORAL | 1 refills | Status: DC

## 2020-03-06 MED ORDER — NITROGLYCERIN 0.4 MG SL SUBL: 0.4000 mg | SUBLINGUAL_TABLET | SUBLINGUAL | 0 refills | Status: DC | PRN

## 2020-03-06 MED ORDER — CLOPIDOGREL BISULFATE 75 MG PO TABS: 75.0000 mg | ORAL_TABLET | Freq: Every day | ORAL | 3 refills | Status: DC

## 2020-03-06 MED ORDER — AMLODIPINE BESYLATE 10 MG PO TABS: 10.0000 mg | ORAL_TABLET | Freq: Every day | ORAL | 1 refills | Status: DC

## 2020-03-06 MED ORDER — ZOLPIDEM TARTRATE 10 MG PO TABS: 10.0000 mg | ORAL_TABLET | Freq: Every day | ORAL | 1 refills | Status: DC

## 2020-03-06 MED ORDER — CYCLOBENZAPRINE HCL 10 MG PO TABS: 10.0000 mg | ORAL_TABLET | Freq: Three times a day (TID) | ORAL | 1 refills | Status: DC | PRN

## 2020-03-06 NOTE — Progress Notes (Signed)
[facility] [address] [place], [place] [address]  Internal MEDICINE  Office Visit Note  Patient Name: Felicia Woods  [id number]  [id number]  Date of Service: 03/25/2020  Chief Complaint  Patient presents with  . Follow-up  . Diabetes  . Hyperlipidemia  . Hypertension    The patient is here for routine follow up. She is diabetic. Blood sugars are doing well. Her HgbA1c is 6.9 today. She does have coronary artery disease and routinely sees cardiology, however, she has not been in several months. She denies chest pain, chest pressure, or shortness of breath which is unusual. Her blood pressure is generally well managed. Migraines are doing well. She uses Ubrelvy as needed and this relieves her headaches. She is interested in bariatric surgery to help her lose weight, as most medications to help with weight loss are contraindicated due to underlying cardiac condition.       Current Medication: Outpatient Encounter Medications as of 03/06/2020  Medication Sig  . Accu-Chek Softclix Lancets lancets Use as instructed once  DAILY DIAG -E11.9  . albuterol (PROVENTIL HFA;VENTOLIN HFA) 108 (90 Base) MCG/ACT inhaler Inhale 2 puffs into the lungs every 6 (six) hours as needed for wheezing or shortness of breath.  Marland Kitchen amLODipine (NORVASC) 10 MG tablet Take 1 tablet (10 mg total) by mouth daily.  Marland Kitchen aspirin EC 81 MG tablet Take 81 mg by mouth daily.  Marland Kitchen atorvastatin (LIPITOR) 80 MG tablet Take 80 mg by mouth daily.  . bisoprolol-hydrochlorothiazide (ZIAC) 5-6.25 MG tablet Take 1 tablet by mouth daily.  . clopidogrel (PLAVIX) 75 MG tablet Take 1 tablet (75 mg total) by mouth daily.  . cyclobenzaprine (FLEXERIL) 10 MG tablet Take 1 tablet (10 mg total) by mouth 3 (three) times daily as needed for muscle spasms.  . furosemide (LASIX) 40 MG tablet Take 1 tablet (40 mg total) by mouth 2 (two) times daily.  Marland Kitchen glimepiride (AMARYL) 1 MG tablet Take 1 tablet (1 mg total) by mouth daily with  breakfast.  . glucose blood (ACCU-CHEK AVIVA PLUS) test strip 1 each by Other route as needed for other. Use as instructed once  DAILY DIAG -E11.9  . influenza vac recom quadrivalent (FLUBLOK QUADRIVALENT) 0.5 ML injection Flublok Quad 2020-2021 (PF) 180 mcg (45 mcg x 4)/0.5 mL IM syringe  PHARMACIST ADMINISTERED IMMUNIZATION ADMINISTERED AT TIME OF DISPENSING  . isosorbide mononitrate (IMDUR) 30 MG 24 hr tablet Take 1 tablet (30 mg total) by mouth daily.  Marland Kitchen losartan (COZAAR) 100 MG tablet Take 1 tablet (100 mg total) by mouth daily. Take one tab po qd  . meloxicam (MOBIC) 15 MG tablet Take 1 tablet (15 mg total) by mouth daily.  . methocarbamol (ROBAXIN) 500 MG tablet Take 1 tablet (500 mg total) by mouth 4 (four) times daily.  . montelukast (SINGULAIR) 10 MG tablet Take 1 tablet (10 mg total) by mouth at bedtime.  . nitroGLYCERIN (NITROSTAT) 0.4 MG SL tablet Place 1 tablet (0.4 mg total) under the tongue every 5 (five) minutes x 3 doses as needed for chest pain. *If no relief call MD or go to Emergency Room*  . oxybutynin (DITROPAN-XL) 5 MG 24 hr tablet Take 1 tablet (5 mg total) by mouth daily with supper.  . ranolazine (RANEXA) 500 MG 12 hr tablet Take 500 mg by mouth 2 (two) times daily.  . sertraline (ZOLOFT) 100 MG tablet Take 1 tablet (100 mg total) by mouth daily.  . simvastatin (ZOCOR) 40 MG tablet Take 40 mg by  mouth daily.  . traMADol (ULTRAM) 50 MG tablet Take 50 mg by mouth every 6 (six) hours.  . TRULICITY 1.5 CH/8.8FO SOPN INJECT 1.5MG INTO THE SKIN ONCE A WEEK  . Ubrogepant (UBRELVY) 100 MG TABS Take 100 mg by mouth daily as needed.  . Vitamin D, Ergocalciferol, (DRISDOL) 1.25 MG (50000 UNIT) CAPS capsule Take 1 capsule (50,000 Units total) by mouth every 7 (seven) days.  Marland Kitchen zolpidem (AMBIEN) 10 MG tablet Take 1 tablet (10 mg total) by mouth at bedtime.  . [DISCONTINUED] amLODipine (NORVASC) 10 MG tablet Take 1 tablet (10 mg total) by mouth daily.  . [DISCONTINUED] clopidogrel  (PLAVIX) 75 MG tablet Take 1 tablet (75 mg total) by mouth daily.  . [DISCONTINUED] cyclobenzaprine (FLEXERIL) 10 MG tablet Take 1 tablet (10 mg total) by mouth 3 (three) times daily as needed for muscle spasms.  . [DISCONTINUED] isosorbide mononitrate (IMDUR) 30 MG 24 hr tablet Take 1 tablet (30 mg total) by mouth daily.  . [DISCONTINUED] losartan (COZAAR) 100 MG tablet Take 1 tablet (100 mg total) by mouth daily. Take one tab po qd  . [DISCONTINUED] nitroGLYCERIN (NITROSTAT) 0.4 MG SL tablet Place 0.4 mg under the tongue every 5 (five) minutes x 3 doses as needed for chest pain. *If no relief call MD or go to Emergency Room*  . [DISCONTINUED] sertraline (ZOLOFT) 100 MG tablet Take 1 tablet (100 mg total) by mouth daily.  . [DISCONTINUED] zolpidem (AMBIEN) 10 MG tablet Take 1 tablet (10 mg total) by mouth at bedtime.   No facility-administered encounter medications on file as of 03/06/2020.    Surgical History: Past Surgical History:  Procedure Laterality Date  . ABDOMINAL HYSTERECTOMY    . CARDIAC CATHETERIZATION    . CARDIAC CATHETERIZATION N/A 10/18/2015   Procedure: Left Heart Cath and Cors/Grafts Angiography;  Surgeon: Lorretta Harp, MD;  Location: [place] [facility];  Service: Cardiovascular;  Laterality: N/A;  . CARDIAC CATHETERIZATION N/A 10/18/2015   Procedure: Coronary Stent Intervention;  Surgeon: Lorretta Harp, MD;  Location: [place] [facility];  Service: Cardiovascular;  Laterality: N/A;  . CARDIAC SURGERY    . CHOLECYSTECTOMY    . CORONARY ANGIOPLASTY    . CORONARY ARTERY BYPASS GRAFT     4 vessels - 2010  . CORONARY STENT INTERVENTION N/A 02/16/2017   Procedure: CORONARY STENT INTERVENTION;  Surgeon: Yolonda Kida, MD;  Location: [place] [facility];  Service: Cardiovascular;  Laterality: N/A;  . CORONARY STENT INTERVENTION N/A 02/13/2019   Procedure: CORONARY STENT INTERVENTION;  Surgeon: Nelva Bush, MD;  Location: [place] [facility];  Service:  Cardiovascular;  Laterality: N/A;  SVG to RCA  . CORONARY STENT INTERVENTION Left 03/08/2019   Procedure: CORONARY STENT INTERVENTION;  Surgeon: Yolonda Kida, MD;  Location: [place] [facility];  Service: Cardiovascular;  Laterality: Left;  . LEFT HEART CATH AND CORONARY ANGIOGRAPHY Left 02/16/2017   Procedure: LEFT HEART CATH AND CORONARY ANGIOGRAPHY;  Surgeon: Corey Skains, MD;  Location: [place] [facility];  Service: Cardiovascular;  Laterality: Left;  . LEFT HEART CATH AND CORS/GRAFTS ANGIOGRAPHY Left 11/23/2017   Procedure: LEFT HEART CATH AND CORS/GRAFTS ANGIOGRAPHY;  Surgeon: Corey Skains, MD;  Location: [place] [facility];  Service: Cardiovascular;  Laterality: Left;  . LEFT HEART CATH AND CORS/GRAFTS ANGIOGRAPHY N/A 02/13/2019   Procedure: LEFT HEART CATH AND CORS/GRAFTS ANGIOGRAPHY;  Surgeon: Corey Skains, MD;  Location: [place] [place] [facility];  Service: Cardiovascular;  Laterality: N/A;  .  LEFT HEART CATH AND CORS/GRAFTS ANGIOGRAPHY N/A 07/03/2019   Procedure: LEFT HEART CATH AND CORS/GRAFTS ANGIOGRAPHY;  Surgeon: Corey Skains, MD;  Location: [place] [facility];  Service: Cardiovascular;  Laterality: N/A;    Medical History: Past Medical History:  Diagnosis Date  . Asthma   . Coronary artery disease   . Diabetes mellitus without complication ([place])   . Heart attack ([place])   . Hyperlipidemia   . Hypertension   . MI (myocardial infarction) ([place])   . Migraine headache with aura   . 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    Family History: Family History  Problem Relation Age of Onset  . Diabetes Mother   . Diabetes Father   . Cancer Father   . Diabetes Brother     Social History   Socioeconomic History  . Marital status: Married    Spouse name: Jeneen Rinks  . Number of children: Not on file  . Years of education: Not on file  . Highest education level: Not on file  Occupational History  . Not on file  Tobacco Use  . Smoking status: Never Smoker  .  Smokeless tobacco: Never Used  Vaping Use  . Vaping Use: Never used  Substance and Sexual Activity  . Alcohol use: No    Alcohol/week: 0.0 standard drinks  . Drug use: No  . Sexual activity: Yes  Other Topics Concern  . Not on file  Social History Narrative  . Not on file   Social Determinants of Health   Financial Resource Strain:   . Difficulty of Paying Living Expenses: Not on file  Food Insecurity:   . Worried About Charity fundraiser in the Last Year: Not on file  . Ran Out of Food in the Last Year: Not on file  Transportation Needs:   . Lack of Transportation (Medical): Not on file  . Lack of Transportation (Non-Medical): Not on file  Physical Activity:   . Days of Exercise per Week: Not on file  . Minutes of Exercise per Session: Not on file  Stress:   . Feeling of Stress : Not on file  Social Connections:   . Frequency of Communication with Friends and Family: Not on file  . Frequency of Social Gatherings with Friends and Family: Not on file  . Attends Religious Services: Not on file  . Active Member of Clubs or Organizations: Not on file  . Attends Archivist Meetings: Not on file  . Marital Status: Not on file  Intimate Partner Violence:   . Fear of Current or Ex-Partner: Not on file  . Emotionally Abused: Not on file  . Physically Abused: Not on file  . Sexually Abused: Not on file      Review of Systems  Constitutional: Negative for chills, fatigue and unexpected weight change.       Two pound weight gain since her last visit.   HENT: Negative for congestion, postnasal drip, rhinorrhea, sneezing and sore throat.   Respiratory: Negative for cough, chest tightness, shortness of breath and wheezing.   Cardiovascular: Negative for chest pain and palpitations.       History of coronary artery disease.   Gastrointestinal: Negative for abdominal pain, constipation, diarrhea, nausea and vomiting.  Endocrine: Negative for cold intolerance, heat  intolerance, polydipsia and polyuria.       Blood sugars doing well   Musculoskeletal: Negative for arthralgias, back pain, joint swelling and neck pain.  Skin: Negative for rash.  Allergic/Immunologic: Negative for environmental allergies.  Neurological: Positive for headaches. Negative for tremors and numbness.       Headaches well managed with current medication.  Hematological: Negative for adenopathy. Does not bruise/bleed easily.  Psychiatric/Behavioral: Negative for behavioral problems (Depression), sleep disturbance and suicidal ideas. The patient is nervous/anxious.     Today's Vitals   03/06/20 1548  BP: (!) 142/94  Pulse: 82  Resp: 16  Temp: 97.8 F (36.6 C)  SpO2: 99%  Weight: 254 lb (115.2 kg)   Body mass index is 39.78 kg/m.  Physical Exam Vitals and nursing note reviewed.  Constitutional:      General: She is not in acute distress.    Appearance: Normal appearance. She is well-developed. She is not diaphoretic.  HENT:     Head: Normocephalic and atraumatic.     Nose: Nose normal.     Mouth/Throat:     Pharynx: No oropharyngeal exudate.  Eyes:     Pupils: Pupils are equal, round, and reactive to light.  Neck:     Thyroid: No thyromegaly.     Vascular: No carotid bruit or JVD.     Trachea: No tracheal deviation.  Cardiovascular:     Rate and Rhythm: Normal rate and regular rhythm.     Heart sounds: Murmur heard.  No friction rub. No gallop.   Pulmonary:     Effort: Pulmonary effort is normal. No respiratory distress.     Breath sounds: Normal breath sounds. No wheezing or rales.  Chest:     Chest wall: No tenderness.  Abdominal:     Palpations: Abdomen is soft.  Musculoskeletal:        General: Normal range of motion.     Cervical back: Normal range of motion and neck supple.  Lymphadenopathy:     Cervical: No cervical adenopathy.  Skin:    General: Skin is warm and dry.  Neurological:     General: No focal deficit present.     Mental Status:  She is alert and oriented to person, place, and time.     Cranial Nerves: No cranial nerve deficit.  Psychiatric:        Mood and Affect: Mood normal.        Behavior: Behavior normal.        Thought Content: Thought content normal.        Judgment: Judgment normal.    Assessment/Plan: 1. Uncontrolled type 2 diabetes mellitus with hyperglycemia (HCC) - POCT HgB A1C 6.9 today. Continue diabetic medication as prescribed. Monitor blood sugars closely.   2. Essential hypertension Generally stable. Continue amlodipine and losartan as prescribed. Refills provided today.  - amLODipine (NORVASC) 10 MG tablet; Take 1 tablet (10 mg total) by mouth daily.  Dispense: 90 tablet; Refill: 1 - losartan (COZAAR) 100 MG tablet; Take 1 tablet (100 mg total) by mouth daily. Take one tab po qd  Dispense: 90 tablet; Refill: 1  3. Hypertensive heart disease without heart failure Stable. Continue isosorbide daily and use nitroglycerine as needed and as prescribed. She should follow up with cardiology routinely.  - isosorbide mononitrate (IMDUR) 30 MG 24 hr tablet; Take 1 tablet (30 mg total) by mouth daily.  Dispense: 30 tablet; Refill: 0 - nitroGLYCERIN (NITROSTAT) 0.4 MG SL tablet; Place 1 tablet (0.4 mg total) under the tongue every 5 (five) minutes x 3 doses as needed for chest pain. *If no relief call MD or go to Emergency Room*  Dispense: 30 tablet; Refill: 0  4.  Atherosclerosis of aorta ([place]) She should continue plavix, simvastatin, and baby aspirin as prescribed. She should follow up with cardiology as scheduled.   5. S/P drug eluting coronary stent placement She should continue plavix, simvastatin, and baby aspirin as prescribed. She should follow up with cardiology as scheduled.  - clopidogrel (PLAVIX) 75 MG tablet; Take 1 tablet (75 mg total) by mouth daily.  Dispense: 90 tablet; Refill: 3  6. Neck pain May take flexeril 10mg  up to three times daily as needed for musle pain/spasms. -  cyclobenzaprine (FLEXERIL) 10 MG tablet; Take 1 tablet (10 mg total) by mouth 3 (three) times daily as needed for muscle spasms.  Dispense: 30 tablet; Refill: 1  7. Depression, major, single episode, in partial remission ([place]) Well managed. Continue sertraline 100mg  daily. - sertraline (ZOLOFT) 100 MG tablet; Take 1 tablet (100 mg total) by mouth daily.  Dispense: 90 tablet; Refill: 3  8. Other insomnia May take insomnia 10mg  at bedtime as needed for insomnia. Refills sent to her pharmacy today.  - zolpidem (AMBIEN) 10 MG tablet; Take 1 tablet (10 mg total) by mouth at bedtime.  Dispense: 90 tablet; Refill: 1  9. BMI 39.0-39.9,adult Refer to surgery to discuss possibility for bariatric surgery.  - Ambulatory referral to General Surgery  General Counseling: Birdena verbalizes understanding of the findings of todays visit and agrees with plan of treatment. I have discussed any further diagnostic evaluation that may be needed or ordered today. We also reviewed her medications today. she has been encouraged to call the office with any questions or concerns that should arise related to todays visit.  Hypertension Counseling:   The following hypertensive lifestyle modification were recommended and discussed:  1. Limiting alcohol intake to less than 1 oz/day of ethanol:(24 oz of beer or 8 oz of wine or 2 oz of 100-proof whiskey). 2. Take baby ASA 81 mg daily. 3. Importance of regular aerobic exercise and losing weight. 4. Reduce dietary saturated fat and cholesterol intake for overall cardiovascular health. 5. Maintaining adequate dietary potassium, calcium, and magnesium intake. 6. Regular monitoring of the blood pressure. 7. Reduce sodium intake to less than 100 mmol/day (less than 2.3 gm of sodium or less than 6 gm of sodium choride)   This patient was seen by [place] with Dr Lavera Guise as a part of collaborative care agreement  Orders Placed This Encounter   Procedures  . Ambulatory referral to General Surgery  . POCT HgB A1C    Meds ordered this encounter  Medications  . amLODipine (NORVASC) 10 MG tablet    Sig: Take 1 tablet (10 mg total) by mouth daily.    Dispense:  90 tablet    Refill:  1    Order Specific Question:   Supervising Provider    Answer:   Lavera Guise [[id number]]  . clopidogrel (PLAVIX) 75 MG tablet    Sig: Take 1 tablet (75 mg total) by mouth daily.    Dispense:  90 tablet    Refill:  3    Order Specific Question:   Supervising Provider    Answer:   Lavera Guise [[id number]]  . cyclobenzaprine (FLEXERIL) 10 MG tablet    Sig: Take 1 tablet (10 mg total) by mouth 3 (three) times daily as needed for muscle spasms.    Dispense:  30 tablet    Refill:  1    Order Specific Question:   Supervising Provider    Answer:   Humphrey Rolls,  [place] M [[id number]]  . isosorbide mononitrate (IMDUR) 30 MG 24 hr tablet    Sig: Take 1 tablet (30 mg total) by mouth daily.    Dispense:  30 tablet    Refill:  0    Order Specific Question:   Supervising Provider    Answer:   Lavera Guise [[id number]]  . losartan (COZAAR) 100 MG tablet    Sig: Take 1 tablet (100 mg total) by mouth daily. Take one tab po qd    Dispense:  90 tablet    Refill:  1    Order Specific Question:   Supervising Provider    Answer:   Lavera Guise [[id number]]  . nitroGLYCERIN (NITROSTAT) 0.4 MG SL tablet    Sig: Place 1 tablet (0.4 mg total) under the tongue every 5 (five) minutes x 3 doses as needed for chest pain. *If no relief call MD or go to Emergency Room*    Dispense:  30 tablet    Refill:  0    Order Specific Question:   Supervising Provider    Answer:   Lavera Guise [[id number]]  . sertraline (ZOLOFT) 100 MG tablet    Sig: Take 1 tablet (100 mg total) by mouth daily.    Dispense:  90 tablet    Refill:  3    Order Specific Question:   Supervising Provider    Answer:   Lavera Guise [[id number]]  . zolpidem (AMBIEN) 10 MG tablet    Sig: Take 1 tablet (10 mg total) by mouth at bedtime.     Dispense:  90 tablet    Refill:  1    Order Specific Question:   Supervising Provider    Answer:   Lavera Guise [[id number]]    Total time spent: 43 Minutes   Time spent includes review of chart, medications, test results, and follow up plan with the patient.      Dr Lavera Guise Internal medicine

## 2020-03-12 DIAGNOSIS — M5416 Radiculopathy, lumbar region: Secondary | ICD-10-CM | POA: Diagnosis not present

## 2020-03-12 DIAGNOSIS — M25561 Pain in right knee: Secondary | ICD-10-CM | POA: Diagnosis not present

## 2020-03-18 DIAGNOSIS — M5416 Radiculopathy, lumbar region: Secondary | ICD-10-CM | POA: Diagnosis not present

## 2020-03-18 DIAGNOSIS — M25561 Pain in right knee: Secondary | ICD-10-CM | POA: Diagnosis not present

## 2020-03-19 DIAGNOSIS — I700 Atherosclerosis of aorta: Secondary | ICD-10-CM | POA: Insufficient documentation

## 2020-03-21 DIAGNOSIS — M1712 Unilateral primary osteoarthritis, left knee: Secondary | ICD-10-CM | POA: Diagnosis not present

## 2020-03-24 DIAGNOSIS — M25561 Pain in right knee: Secondary | ICD-10-CM | POA: Diagnosis not present

## 2020-03-24 DIAGNOSIS — M5416 Radiculopathy, lumbar region: Secondary | ICD-10-CM | POA: Diagnosis not present

## 2020-03-25 DIAGNOSIS — M542 Cervicalgia: Secondary | ICD-10-CM | POA: Insufficient documentation

## 2020-03-25 DIAGNOSIS — Z6839 Body mass index (BMI) 39.0-39.9, adult: Secondary | ICD-10-CM | POA: Insufficient documentation

## 2020-03-25 DIAGNOSIS — E669 Obesity, unspecified: Secondary | ICD-10-CM | POA: Insufficient documentation

## 2020-03-25 DIAGNOSIS — G4709 Other insomnia: Secondary | ICD-10-CM | POA: Insufficient documentation

## 2020-03-25 DIAGNOSIS — E1165 Type 2 diabetes mellitus with hyperglycemia: Secondary | ICD-10-CM | POA: Insufficient documentation

## 2020-03-27 DIAGNOSIS — M5416 Radiculopathy, lumbar region: Secondary | ICD-10-CM | POA: Diagnosis not present

## 2020-03-27 DIAGNOSIS — M25561 Pain in right knee: Secondary | ICD-10-CM | POA: Diagnosis not present

## 2020-03-31 DIAGNOSIS — M25561 Pain in right knee: Secondary | ICD-10-CM | POA: Diagnosis not present

## 2020-03-31 DIAGNOSIS — M5416 Radiculopathy, lumbar region: Secondary | ICD-10-CM | POA: Diagnosis not present

## 2020-04-01 DIAGNOSIS — I10 Essential (primary) hypertension: Secondary | ICD-10-CM | POA: Diagnosis not present

## 2020-04-01 DIAGNOSIS — E782 Mixed hyperlipidemia: Secondary | ICD-10-CM | POA: Diagnosis not present

## 2020-04-01 DIAGNOSIS — I25708 Atherosclerosis of coronary artery bypass graft(s), unspecified, with other forms of angina pectoris: Secondary | ICD-10-CM | POA: Diagnosis not present

## 2020-04-01 DIAGNOSIS — I5032 Chronic diastolic (congestive) heart failure: Secondary | ICD-10-CM | POA: Diagnosis not present

## 2020-04-08 DIAGNOSIS — M25561 Pain in right knee: Secondary | ICD-10-CM | POA: Diagnosis not present

## 2020-04-08 DIAGNOSIS — M5416 Radiculopathy, lumbar region: Secondary | ICD-10-CM | POA: Diagnosis not present

## 2020-04-17 ENCOUNTER — Encounter: Payer: Self-pay | Admitting: Internal Medicine

## 2020-04-17 ENCOUNTER — Other Ambulatory Visit: Payer: Self-pay

## 2020-04-17 ENCOUNTER — Ambulatory Visit (INDEPENDENT_AMBULATORY_CARE_PROVIDER_SITE_OTHER): Payer: Medicaid Other | Admitting: Internal Medicine

## 2020-04-17 DIAGNOSIS — R0609 Other forms of dyspnea: Secondary | ICD-10-CM

## 2020-04-17 DIAGNOSIS — E1165 Type 2 diabetes mellitus with hyperglycemia: Secondary | ICD-10-CM

## 2020-04-17 DIAGNOSIS — M25531 Pain in right wrist: Secondary | ICD-10-CM | POA: Diagnosis not present

## 2020-04-17 DIAGNOSIS — M25561 Pain in right knee: Secondary | ICD-10-CM | POA: Diagnosis not present

## 2020-04-17 DIAGNOSIS — T2111XA Burn of first degree of chest wall, initial encounter: Secondary | ICD-10-CM

## 2020-04-17 DIAGNOSIS — R0600 Dyspnea, unspecified: Secondary | ICD-10-CM

## 2020-04-17 DIAGNOSIS — I700 Atherosclerosis of aorta: Secondary | ICD-10-CM

## 2020-04-17 DIAGNOSIS — M5416 Radiculopathy, lumbar region: Secondary | ICD-10-CM | POA: Diagnosis not present

## 2020-04-17 DIAGNOSIS — M25532 Pain in left wrist: Secondary | ICD-10-CM

## 2020-04-17 DIAGNOSIS — M1711 Unilateral primary osteoarthritis, right knee: Secondary | ICD-10-CM | POA: Diagnosis not present

## 2020-04-17 MED ORDER — SILVER SULFADIAZINE 1 % EX CREA: 1.0000 "application " | TOPICAL_CREAM | Freq: Every day | CUTANEOUS | 0 refills | Status: DC

## 2020-04-17 MED ORDER — DOXYCYCLINE HYCLATE 100 MG PO TABS: 100.0000 mg | ORAL_TABLET | Freq: Two times a day (BID) | ORAL | 0 refills | Status: DC

## 2020-04-17 NOTE — Progress Notes (Signed)
[facility] [place], [place] [address]  Internal MEDICINE  Office Visit Note  Patient Name: Felicia Woods  [id number]  [id number]  Date of Service: 04/17/2020  Chief Complaint  Patient presents with  . Pain    wrist both  . other    infection in right breat due to burn by heating pad  . Diabetes  . Hypertension  . controlled substance policy    reviewed    HPI  Pt is here with multiple complaints 1. Hands and wrist swelling, she is wearing wrist splints,  2. Burned her right breast while using a heating pad. It happened about a week ago. 3. DOE followed by cardiology, will need cardiac cath,  4. Unknown sleep apnea history 5. H/O COPD 6. Diabetic numbers are at not target    Current Medication: Outpatient Encounter Medications as of 04/17/2020  Medication Sig  . Accu-Chek Softclix Lancets lancets Use as instructed once  DAILY DIAG -E11.9  . albuterol (PROVENTIL HFA;VENTOLIN HFA) 108 (90 Base) MCG/ACT inhaler Inhale 2 puffs into the lungs every 6 (six) hours as needed for wheezing or shortness of breath.  Marland Kitchen amLODipine (NORVASC) 10 MG tablet Take 1 tablet (10 mg total) by mouth daily.  Marland Kitchen aspirin EC 81 MG tablet Take 81 mg by mouth daily.  Marland Kitchen atorvastatin (LIPITOR) 80 MG tablet Take 80 mg by mouth daily.  . bisoprolol-hydrochlorothiazide (ZIAC) 5-6.25 MG tablet Take 1 tablet by mouth daily.  . clopidogrel (PLAVIX) 75 MG tablet Take 1 tablet (75 mg total) by mouth daily.  . cyclobenzaprine (FLEXERIL) 10 MG tablet Take 1 tablet (10 mg total) by mouth 3 (three) times daily as needed for muscle spasms.  Marland Kitchen doxycycline (VIBRA-TABS) 100 MG tablet Take 1 tablet (100 mg total) by mouth 2 (two) times daily. With food for skin infection  . furosemide (LASIX) 40 MG tablet Take 1 tablet (40 mg total) by mouth 2 (two) times daily.  Marland Kitchen glimepiride (AMARYL) 1 MG tablet Take 1 tablet (1 mg total) by mouth daily with breakfast.  . glucose blood (ACCU-CHEK AVIVA PLUS)  test strip 1 each by Other route as needed for other. Use as instructed once  DAILY DIAG -E11.9  . influenza vac recom quadrivalent (FLUBLOK QUADRIVALENT) 0.5 ML injection Flublok Quad 2020-2021 (PF) 180 mcg (45 mcg x 4)/0.5 mL IM syringe  PHARMACIST ADMINISTERED IMMUNIZATION ADMINISTERED AT TIME OF DISPENSING  . isosorbide mononitrate (IMDUR) 30 MG 24 hr tablet Take 1 tablet (30 mg total) by mouth daily.  Marland Kitchen losartan (COZAAR) 100 MG tablet Take 1 tablet (100 mg total) by mouth daily. Take one tab po qd  . meloxicam (MOBIC) 15 MG tablet Take 1 tablet (15 mg total) by mouth daily.  . methocarbamol (ROBAXIN) 500 MG tablet Take 1 tablet (500 mg total) by mouth 4 (four) times daily.  . montelukast (SINGULAIR) 10 MG tablet Take 1 tablet (10 mg total) by mouth at bedtime.  . nitroGLYCERIN (NITROSTAT) 0.4 MG SL tablet Place 1 tablet (0.4 mg total) under the tongue every 5 (five) minutes x 3 doses as needed for chest pain. *If no relief call MD or go to Emergency Room*  . oxybutynin (DITROPAN-XL) 5 MG 24 hr tablet Take 1 tablet (5 mg total) by mouth daily with supper.  . ranolazine (RANEXA) 500 MG 12 hr tablet Take 500 mg by mouth 2 (two) times daily.  . sertraline (ZOLOFT) 100 MG tablet Take 1 tablet (100 mg total) by mouth daily.  Marland Kitchen  silver sulfADIAZINE (SILVADENE) 1 % cream Apply 1 application topically daily.  . simvastatin (ZOCOR) 40 MG tablet Take 40 mg by mouth daily.  . traMADol (ULTRAM) 50 MG tablet Take 50 mg by mouth every 6 (six) hours.  . TRULICITY 1.5 KZ/9.9JT SOPN INJECT 1.5MG INTO THE SKIN ONCE A WEEK  . Ubrogepant (UBRELVY) 100 MG TABS Take 100 mg by mouth daily as needed.  . Vitamin D, Ergocalciferol, (DRISDOL) 1.25 MG (50000 UNIT) CAPS capsule Take 1 capsule (50,000 Units total) by mouth every 7 (seven) days.  Marland Kitchen zolpidem (AMBIEN) 10 MG tablet Take 1 tablet (10 mg total) by mouth at bedtime.   No facility-administered encounter medications on file as of 04/17/2020.    Surgical  History: Past Surgical History:  Procedure Laterality Date  . ABDOMINAL HYSTERECTOMY    . CARDIAC CATHETERIZATION    . CARDIAC CATHETERIZATION N/A 10/18/2015   Procedure: Left Heart Cath and Cors/Grafts Angiography;  Surgeon: Lorretta Harp, MD;  Location: [place] [facility];  Service: Cardiovascular;  Laterality: N/A;  . CARDIAC CATHETERIZATION N/A 10/18/2015   Procedure: Coronary Stent Intervention;  Surgeon: Lorretta Harp, MD;  Location: [place] [facility];  Service: Cardiovascular;  Laterality: N/A;  . CARDIAC SURGERY    . CHOLECYSTECTOMY    . CORONARY ANGIOPLASTY    . CORONARY ARTERY BYPASS GRAFT     4 vessels - 2010  . CORONARY STENT INTERVENTION N/A 02/16/2017   Procedure: CORONARY STENT INTERVENTION;  Surgeon: Yolonda Kida, MD;  Location: [place] [facility];  Service: Cardiovascular;  Laterality: N/A;  . CORONARY STENT INTERVENTION N/A 02/13/2019   Procedure: CORONARY STENT INTERVENTION;  Surgeon: Nelva Bush, MD;  Location: [place] [facility];  Service: Cardiovascular;  Laterality: N/A;  SVG to RCA  . CORONARY STENT INTERVENTION Left 03/08/2019   Procedure: CORONARY STENT INTERVENTION;  Surgeon: Yolonda Kida, MD;  Location: [place] [facility];  Service: Cardiovascular;  Laterality: Left;  . LEFT HEART CATH AND CORONARY ANGIOGRAPHY Left 02/16/2017   Procedure: LEFT HEART CATH AND CORONARY ANGIOGRAPHY;  Surgeon: Corey Skains, MD;  Location: [place] [facility];  Service: Cardiovascular;  Laterality: Left;  . LEFT HEART CATH AND CORS/GRAFTS ANGIOGRAPHY Left 11/23/2017   Procedure: LEFT HEART CATH AND CORS/GRAFTS ANGIOGRAPHY;  Surgeon: Corey Skains, MD;  Location: [place] [facility];  Service: Cardiovascular;  Laterality: Left;  . LEFT HEART CATH AND CORS/GRAFTS ANGIOGRAPHY N/A 02/13/2019   Procedure: LEFT HEART CATH AND CORS/GRAFTS ANGIOGRAPHY;  Surgeon: Corey Skains, MD;  Location: [place] [facility];  Service: Cardiovascular;  Laterality: N/A;   . LEFT HEART CATH AND CORS/GRAFTS ANGIOGRAPHY N/A 07/03/2019   Procedure: LEFT HEART CATH AND CORS/GRAFTS ANGIOGRAPHY;  Surgeon: Corey Skains, MD;  Location: [place] [facility];  Service: Cardiovascular;  Laterality: N/A;    Medical History: Past Medical History:  Diagnosis Date  . Asthma   . Coronary artery disease   . Diabetes mellitus without complication ([place])   . Heart attack ([place])   . Hyperlipidemia   . Hypertension   . MI (myocardial infarction) ([place])   . Migraine headache with aura   . 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    Family History: Family History  Problem Relation Age of Onset  . Diabetes Mother   . Diabetes Father   . Cancer Father   . Diabetes Brother     Social History   Socioeconomic History  . Marital status: Married  Spouse name: Jeneen Rinks  . Number of children: Not on file  . Years of education: Not on file  . Highest education level: Not on file  Occupational History  . Not on file  Tobacco Use  . Smoking status: Never Smoker  . Smokeless tobacco: Never Used  Vaping Use  . Vaping Use: Never used  Substance and Sexual Activity  . Alcohol use: No    Alcohol/week: 0.0 standard drinks  . Drug use: No  . Sexual activity: Yes  Other Topics Concern  . Not on file  Social History Narrative  . Not on file   Social Determinants of Health   Financial Resource Strain:   . Difficulty of Paying Living Expenses: Not on file  Food Insecurity:   . Worried About Charity fundraiser in the Last Year: Not on file  . Ran Out of Food in the Last Year: Not on file  Transportation Needs:   . Lack of Transportation (Medical): Not on file  . Lack of Transportation (Non-Medical): Not on file  Physical Activity:   . Days of Exercise per Week: Not on file  . Minutes of Exercise per Session: Not on file  Stress:   . Feeling of Stress : Not on file  Social Connections:   . Frequency of Communication with Friends and Family: Not on file  . Frequency  of Social Gatherings with Friends and Family: Not on file  . Attends Religious Services: Not on file  . Active Member of Clubs or Organizations: Not on file  . Attends Archivist Meetings: Not on file  . Marital Status: Not on file  Intimate Partner Violence:   . Fear of Current or Ex-Partner: Not on file  . Emotionally Abused: Not on file  . Physically Abused: Not on file  . Sexually Abused: Not on file      Review of Systems  Constitutional: Negative for chills, diaphoresis and fatigue.  HENT: Negative for ear pain, postnasal drip and sinus pressure.   Eyes: Negative for photophobia, discharge, redness, itching and visual disturbance.  Respiratory: Positive for chest tightness. Negative for cough, shortness of breath and wheezing.   Cardiovascular: Negative for chest pain, palpitations and leg swelling.  Gastrointestinal: Negative for abdominal pain and vomiting.  Genitourinary: Negative for dysuria and flank pain.  Musculoskeletal: Negative for arthralgias, back pain, gait problem and neck pain.  Skin: Positive for color change and wound.  Allergic/Immunologic: Negative for environmental allergies and food allergies.  Neurological: Negative for dizziness and headaches.  Hematological: Does not bruise/bleed easily.  Psychiatric/Behavioral: Negative for agitation, behavioral problems (depression) and hallucinations.    Vital Signs: BP (!) 148/90   Pulse 79   Resp 16   Ht '5\' 7"'  (1.702 m)   Wt 251 lb (113.9 kg)   SpO2 98%   BMI 39.31 kg/m    Physical Exam Constitutional:      General: She is not in acute distress.    Appearance: She is well-developed. She is not diaphoretic.  HENT:     Head: Normocephalic and atraumatic.     Mouth/Throat:     Pharynx: No oropharyngeal exudate.  Eyes:     Pupils: Pupils are equal, round, and reactive to light.  Neck:     Thyroid: No thyromegaly.     Vascular: No JVD.     Trachea: No tracheal deviation.  Cardiovascular:      Rate and Rhythm: Normal rate and regular rhythm.     Heart  sounds: Normal heart sounds. No murmur heard.  No friction rub. No gallop.   Pulmonary:     Effort: Pulmonary effort is normal. No respiratory distress.     Breath sounds: No wheezing or rales.  Chest:     Chest wall: No tenderness.    Abdominal:     General: Bowel sounds are normal.     Palpations: Abdomen is soft.  Musculoskeletal:        General: Swelling and tenderness present.     Cervical back: Normal range of motion and neck supple.     Comments: Wrist joints are swollen   Lymphadenopathy:     Cervical: No cervical adenopathy.  Skin:    General: Skin is warm and dry.  Neurological:     Mental Status: She is alert and oriented to person, place, and time.     Cranial Nerves: No cranial nerve deficit.  Psychiatric:        Behavior: Behavior normal.        Thought Content: Thought content normal.        Judgment: Judgment normal.     Assessment/Plan: 1. Pain in both wrists Pt will try Voltaren gel topically, has CKD, will avoid NSAID, joint involvement is bilateral and symmetrical will get further diagnostics  - Rheumatoid Factor - ANA w/Reflex if Positive; Future - Sed Rate (ESR)  2. Superficial burn of breast, initial encounter Will start therapy and monitor - doxycycline (VIBRA-TABS) 100 MG tablet; Take 1 tablet (100 mg total) by mouth 2 (two) times daily. With food for skin infection  Dispense: 20 tablet; Refill: 0 - silver sulfADIAZINE (SILVADENE) 1 % cream; Apply 1 application topically daily.  Dispense: 50 g; Refill: 0  3. Uncontrolled type 2 diabetes mellitus with hyperglycemia (HCC) DC Amaryl, continue Trulicity, add farxiga 5 mg once a day, will increase on next visit to 10 g if tolerated   4. DOE (dyspnea on exertion) Pt will need PFTs and repeat CT if cardiac work up is negative, need to look into OSA as well   5. Atherosclerosis of aorta (HCC) Will need another vascular study, like  carotid  Dopplers since pt has h/o CAD as well   General Counseling: Meganne verbalizes understanding of the findings of todays visit and agrees with plan of treatment. I have discussed any further diagnostic evaluation that may be needed or ordered today. We also reviewed her medications today. she has been encouraged to call the office with any questions or concerns that should arise related to todays visit.    Orders Placed This Encounter  Procedures  . Rheumatoid Factor  . ANA w/Reflex if Positive  . Sed Rate (ESR)    Meds ordered this encounter  Medications  . doxycycline (VIBRA-TABS) 100 MG tablet    Sig: Take 1 tablet (100 mg total) by mouth 2 (two) times daily. With food for skin infection    Dispense:  20 tablet    Refill:  0  . silver sulfADIAZINE (SILVADENE) 1 % cream    Sig: Apply 1 application topically daily.    Dispense:  50 g    Refill:  0    Total time spent:35 Minutes Time spent includes review of chart, medications, test results, and follow up plan with the patient.      Dr Lavera Guise Internal medicine

## 2020-04-22 DIAGNOSIS — M25561 Pain in right knee: Secondary | ICD-10-CM | POA: Diagnosis not present

## 2020-04-22 DIAGNOSIS — M5416 Radiculopathy, lumbar region: Secondary | ICD-10-CM | POA: Diagnosis not present

## 2020-04-24 ENCOUNTER — Other Ambulatory Visit: Payer: Self-pay

## 2020-04-24 ENCOUNTER — Encounter: Payer: Medicare Other | Attending: Internal Medicine | Admitting: Dietician

## 2020-04-24 ENCOUNTER — Encounter: Payer: Self-pay | Admitting: Dietician

## 2020-04-24 VITALS — Ht 67.0 in | Wt 253.3 lb

## 2020-04-24 DIAGNOSIS — E669 Obesity, unspecified: Secondary | ICD-10-CM

## 2020-04-24 DIAGNOSIS — E1169 Type 2 diabetes mellitus with other specified complication: Secondary | ICD-10-CM

## 2020-04-24 DIAGNOSIS — E119 Type 2 diabetes mellitus without complications: Secondary | ICD-10-CM | POA: Diagnosis not present

## 2020-04-24 DIAGNOSIS — Z6839 Body mass index (BMI) 39.0-39.9, adult: Secondary | ICD-10-CM | POA: Diagnosis not present

## 2020-04-24 DIAGNOSIS — I10 Essential (primary) hypertension: Secondary | ICD-10-CM | POA: Insufficient documentation

## 2020-04-24 DIAGNOSIS — E782 Mixed hyperlipidemia: Secondary | ICD-10-CM | POA: Diagnosis not present

## 2020-04-24 NOTE — Progress Notes (Signed)
Medical Nutrition Therapy: Visit start time: 7209  end time: 1430  Assessment:  Diagnosis: type 2 diabetes, HTN, hyperlipidemia  Past medical history: CHF Psychosocial issues/ stress concerns: pt rates stress level as "moderate" and feels "not so well" about stress management   Preferred learning method:  . Hands-on  Current weight: 253.3 lbs  Height: 5'7" Medications, supplements: reconciled in medical record  Labs HgA1c 6.9 on 03/06/2020  Progress and evaluation:   Pt states she would like to lose weight to improve her breathing, easily winded from walking short distances   Mentioned she would like to play with her grandchildren  Pt usually eats lean cuisine for lunch and dinner and states she only looks at the calories on the nutrition label  Pt states she is not familiar with what she should look for in terms of saturated fat, sodium, carbs, and added sugar  Pt reports she does not consume sugar sweetened beverages  Pt reports she was diagnosed with diabetes in 2010, about to start checking BGs when glucometer is ready for pick up from pharmacy   Physical activity: ADLs, pt states severe SOB with minimal amounts of movement and activity   Dietary Intake:  Usual eating pattern includes 3 meals and 1 snacks per day. Dining out frequency: 2 meals per week.  Breakfast: cereal (honey nut cheerios) with whole milk   Lunch: lean cuisine Snack: bowl of fruit Supper: lean cuisine Beverages: 32oz water with crystal light, 12oz orange juice   Nutrition Care Education: Basic nutrition: basic food groups, appropriate nutrient balance Advanced nutrition: nutrition label reading Diabetes:  appropriate meal and snack schedule, appropriate carb intake and balance, healthy carb choices, role of fiber, protein, fat  Nutritional Diagnosis:  [place]-1.1 Food and nutrition-related knowledge deficit As related to management of multiple chronic conditions (type 2 diabetes, hypertension, and  hyperlipidemia).  As evidenced by nutrition label reading skills and discussion with pt. [place]-3.3 Overweight/obesity As related to caloric intake and inadequate physical activity.  As evidenced by pt current BMI of 39.67.  Intervention:  Discussion and instruction as noted above.  Pt received call about needing to pick up her grandchildren.  Additional nutrition education will be discussed at follow up in three weeks.   Will also discuss goals at follow up.   Increase fruit and vegetable intake   Incorporate vegetables at lunch and dinner   Incorporate more F/V at snacks  Try frozen veggies that come in microwaveable pouches (may be tender than fresh and low prep time)  Try canned fruit NOT in heavy syrup (mandarin oranges, peaches, pears, applesauce) for snacks + protein (cheese, PB)  Smoothies may be easier to consume if appetite is low or on the go   Stress management (review at follow up)  Recommend meeting with Pastoral Care  Recommend virtual diabetes support group   Increase quality of sleep, at follow up discuss link between sleep and weight management   At follow up, discuss Mindful Eating handout    Increase physical activity (review at follow up)  Incrementally reintroduce exercise back into routine   150 minutes per week is recommendation   Try chair yoga or seated exercises on YouTube   Increase fiber intake   Eat at least 3 servings of whole grains a day  Increase fruit and vegetable intake  Increase fluid intake   Drink at least 64oz water daily   Decrease sodium intake   Reduce sodium intake to recommended 1500mg /day   Read nutrition labels on packaged  foods to monitor sodium intake   400-600 mg/meal  <200 mg/snack   Decrease fast food frequency   Avoid processed meats    Decrease saturated fat intake   Try more plant-based sources of protein  Limit processed meats   Switch to low fat dairy products   Education Materials given:   . Plate Planner with food lists  . Goals/ instructions  Learner/ who was taught:  . Patient   Level of understanding: . Partial understanding; needs review/ practice  Demonstrated degree of understanding via:   Teach back Learning barriers: . None  Willingness to learn/ readiness for change: . Acceptance, ready for change  Monitoring and Evaluation:  Dietary intake, exercise, BGs and A1c, and body weight      follow up: November 11 at 330pm

## 2020-05-14 ENCOUNTER — Other Ambulatory Visit: Payer: Self-pay

## 2020-05-14 MED ORDER — ACCU-CHEK AVIVA PLUS W/DEVICE KIT: PACK | 0 refills | Status: DC

## 2020-05-15 ENCOUNTER — Ambulatory Visit: Payer: Medicare Other | Admitting: Dietician

## 2020-05-21 DIAGNOSIS — M654 Radial styloid tenosynovitis [de Quervain]: Secondary | ICD-10-CM | POA: Diagnosis not present

## 2020-05-21 DIAGNOSIS — M1711 Unilateral primary osteoarthritis, right knee: Secondary | ICD-10-CM | POA: Diagnosis not present

## 2020-05-22 ENCOUNTER — Other Ambulatory Visit: Payer: Self-pay

## 2020-05-22 ENCOUNTER — Encounter: Payer: Self-pay | Admitting: Dietician

## 2020-05-22 ENCOUNTER — Encounter: Payer: Medicare Other | Attending: Internal Medicine | Admitting: Dietician

## 2020-05-22 VITALS — Ht 67.0 in | Wt 251.8 lb

## 2020-05-22 DIAGNOSIS — E1169 Type 2 diabetes mellitus with other specified complication: Secondary | ICD-10-CM | POA: Insufficient documentation

## 2020-05-22 DIAGNOSIS — I10 Essential (primary) hypertension: Secondary | ICD-10-CM | POA: Insufficient documentation

## 2020-05-22 DIAGNOSIS — E669 Obesity, unspecified: Secondary | ICD-10-CM

## 2020-05-22 DIAGNOSIS — E1165 Type 2 diabetes mellitus with hyperglycemia: Secondary | ICD-10-CM

## 2020-05-22 MED ORDER — ACCU-CHEK GUIDE VI STRP: ORAL_STRIP | 3 refills | Status: DC

## 2020-05-22 MED ORDER — ACCU-CHEK SOFTCLIX LANCETS MISC: 3 refills | Status: DC

## 2020-05-22 NOTE — Progress Notes (Signed)
Medical Nutrition Therapy: Visit start time: 1610  end time: 1600  Assessment:  Diagnosis: type 2 diabetes, HTN, hyperlipidemia Medical history changes: none Psychosocial issues/ stress concerns: none   Current weight: 251.8 lbs  Height: 5'7" Medications, supplement changes: reconciled in medical record   Progress and evaluation:   . Pt states managing knee function/pain through non-surgery methods of diet/nutrition and physical therapy preferred before moving to surgical possibilities . Pt reports since her heart attack she avoids fried foods and recently purchased an air fryer to get similar flavor of fried foods without oil  . Pt would like help with reading labels and understanding calculating carbs based on grams per serving   Physical activity: ADLs  Dietary Intake:  Usual eating pattern includes 2-3 meals and 1 snacks per day. Dining out frequency: 2 meals per week.  Breakfast: orange juice; or cheerios with milk  Lunch: corned beef sandwich; lean cuisine   Snack: apple, orange Supper: lean cuisine; BBQ chicken, mac and cheese with cornbread, collards; [employer]- shrimp pasta (boxed half to go)   Snack: water with crystal, 12 oz orange juice  Beverages: water with crystal light, 1 can sprite/day,  Nutrition Care Education: Advanced nutrition: nutrition labels   Weight control: reviewed progress since previous visit Diabetes:  goals for BGs, appropriate meal and snack schedule, appropriate carb intake and balance, role of fiber, protein Hypertension:  identifying high sodium foods Hyperlipidemia: healthy and unhealthy fats, role of fiber  Nutritional Diagnosis:  [place]-3.3 Overweight/obesity As related to history of excess caloric intake and inadequate physical activity.  As evidenced by limited mobility, pt discussion, and BMI of 39.44.  Intervention:  Discussion and instruction as noted above.  Pt instructed to call with any questions or concerns regarding nutrition. Pt  utilizes frozen dinners for many meals so made sure to review several examples of nutrition labels for determining better frozen dinner options based on saturated fat, sodium, net carb, and added sugar content.  Pt stated she felt more confident about what to look for on the label.  Expect positive outcomes.    Continue good fruit and vegetable intake   Incorporate vegetables at lunch and dinner   Do not skip breakfast, include whole fruit at breakfast   Try canned fruit NOT in heavy syrup (mandarin oranges, peaches, pears, applesauce) for snacks + protein (cheese, PB)   Decrease sugar-sweetened beverage consumption   Switch from regular to diet sprite; experiment with other SF sodas to find one you like  Drink at least 64oz water daily (continue adding crystal light for flavor enhancement)    Increase physical activity   Incrementally reintroduce exercise back into routine   150 minutes per week is recommendation   Try chair yoga or seated exercises on YouTube   Increase fiber intake   Eat at least 3 servings of whole grains a day  Increase fruit and vegetable intake  Maintain reduced sodium intake   Reduce sodium intake to recommended 15105m/day   Read nutrition labels on packaged foods to monitor sodium intake   400-600 mg/meal  <200 mg/snack   Decrease fast food frequency   Avoid processed meats    Education Materials given:  . General diet guidelines for Diabetes . Plate planner with food lists . Goals/ instructions  Learner/ who was taught:  . Patient   Level of understanding: .Marland KitchenVerbalizes/ demonstrates competency  Demonstrated degree of understanding via:   Teach back Learning barriers: . None  Willingness to learn/ readiness for change: .  Eager, change in progress  Monitoring and Evaluation:  Dietary intake, exercise, BGs and A1c, and body weight      follow up: prn

## 2020-05-23 NOTE — Patient Instructions (Signed)
   Make food choices based on comparing the nutrition labels to the guidelines we discussed   Continue to avoid fried foods   Try to decrease regular soda intake

## 2020-05-26 DIAGNOSIS — M170 Bilateral primary osteoarthritis of knee: Secondary | ICD-10-CM | POA: Diagnosis not present

## 2020-06-05 ENCOUNTER — Ambulatory Visit: Payer: Medicare Other | Admitting: Nurse Practitioner

## 2020-06-13 ENCOUNTER — Other Ambulatory Visit: Payer: Self-pay

## 2020-06-13 DIAGNOSIS — M542 Cervicalgia: Secondary | ICD-10-CM

## 2020-06-13 MED ORDER — CYCLOBENZAPRINE HCL 10 MG PO TABS: 10.0000 mg | ORAL_TABLET | Freq: Three times a day (TID) | ORAL | 1 refills | Status: DC | PRN

## 2020-06-16 ENCOUNTER — Other Ambulatory Visit: Payer: Self-pay

## 2020-06-16 DIAGNOSIS — M542 Cervicalgia: Secondary | ICD-10-CM

## 2020-06-16 MED ORDER — CYCLOBENZAPRINE HCL 10 MG PO TABS: 10.0000 mg | ORAL_TABLET | Freq: Three times a day (TID) | ORAL | 1 refills | Status: DC | PRN

## 2020-06-18 ENCOUNTER — Other Ambulatory Visit: Payer: Self-pay | Admitting: Internal Medicine

## 2020-06-18 DIAGNOSIS — Z1231 Encounter for screening mammogram for malignant neoplasm of breast: Secondary | ICD-10-CM

## 2020-06-19 DIAGNOSIS — Z1231 Encounter for screening mammogram for malignant neoplasm of breast: Secondary | ICD-10-CM | POA: Diagnosis not present

## 2020-07-17 DIAGNOSIS — M25562 Pain in left knee: Secondary | ICD-10-CM | POA: Insufficient documentation

## 2020-08-01 ENCOUNTER — Ambulatory Visit (INDEPENDENT_AMBULATORY_CARE_PROVIDER_SITE_OTHER): Payer: Medicare Other | Admitting: Physician Assistant

## 2020-08-01 ENCOUNTER — Encounter: Payer: Self-pay | Admitting: Physician Assistant

## 2020-08-01 VITALS — Temp 97.1°F | Ht 67.0 in | Wt 251.0 lb

## 2020-08-01 DIAGNOSIS — J0100 Acute maxillary sinusitis, unspecified: Secondary | ICD-10-CM

## 2020-08-01 DIAGNOSIS — R059 Cough, unspecified: Secondary | ICD-10-CM

## 2020-08-01 MED ORDER — BENZONATATE 100 MG PO CAPS: 100.0000 mg | ORAL_CAPSULE | Freq: Two times a day (BID) | ORAL | 0 refills | Status: DC | PRN

## 2020-08-01 MED ORDER — AZITHROMYCIN 250 MG PO TABS: ORAL_TABLET | ORAL | 0 refills | Status: DC

## 2020-08-01 NOTE — Progress Notes (Signed)
[facility] [place], [place] [address]  Internal MEDICINE  Telephone Visit  Patient Name: Felicia Woods  [id number]  [id number]  Date of Service: 08/01/2020  I connected with the patient at 10:41 by telephone and verified the patients identity using two identifiers.   I discussed the limitations, risks, security and privacy concerns of performing an evaluation and management service by telephone and the availability of in person appointments. I also discussed with the patient that there may be a patient responsible charge related to the service.  The patient expressed understanding and agrees to proceed.    Chief Complaint  Patient presents with  . Telephone Assessment    [phone]  . Telephone Screen    Home covid test is negative   . Sinusitis  . Cough    HPI Pt presents today for a sick visit. For the past two weeks, she has been home with 7 grand kids who had runny noses. She has been experiencing runny nose, congestion, and cough for 2 weeks now. The cough is interrupting her sleep now. She denies fever, chills, body aches, headaches, loss of taste or smell. Home covid test was negative yesterday. She has not tried any OTC meds.  Current Medication: Outpatient Encounter Medications as of 08/01/2020  Medication Sig  . Accu-Chek Softclix Lancets lancets Use as instructed twice a day to check blood sugars  DIAG -E11.59  . albuterol (PROVENTIL HFA;VENTOLIN HFA) 108 (90 Base) MCG/ACT inhaler Inhale 2 puffs into the lungs every 6 (six) hours as needed for wheezing or shortness of breath.  Marland Kitchen amLODipine (NORVASC) 10 MG tablet Take 1 tablet (10 mg total) by mouth daily.  Marland Kitchen aspirin EC 81 MG tablet Take 81 mg by mouth daily.  Marland Kitchen atorvastatin (LIPITOR) 80 MG tablet Take 80 mg by mouth daily.  Marland Kitchen azithromycin (ZITHROMAX) 250 MG tablet Use as directed  . benzonatate (TESSALON) 100 MG capsule Take 1 capsule (100 mg total) by mouth 2 (two) times daily as needed for  cough.  . bisoprolol-hydrochlorothiazide (ZIAC) 5-6.25 MG tablet Take 1 tablet by mouth daily.  . clopidogrel (PLAVIX) 75 MG tablet Take 1 tablet (75 mg total) by mouth daily.  . cyclobenzaprine (FLEXERIL) 10 MG tablet Take 1 tablet (10 mg total) by mouth 3 (three) times daily as needed for muscle spasms.  . furosemide (LASIX) 40 MG tablet Take 1 tablet (40 mg total) by mouth 2 (two) times daily.  Marland Kitchen glucose blood (ACCU-CHEK GUIDE) test strip Use as instructed to check blood sugars twice a day  E11.59  . influenza vac recom quadrivalent (FLUBLOK QUADRIVALENT) 0.5 ML injection Flublok Quad 2020-2021 (PF) 180 mcg (45 mcg x 4)/0.5 mL IM syringe  PHARMACIST ADMINISTERED IMMUNIZATION ADMINISTERED AT TIME OF DISPENSING  . isosorbide mononitrate (IMDUR) 30 MG 24 hr tablet Take 1 tablet (30 mg total) by mouth daily.  Marland Kitchen losartan (COZAAR) 100 MG tablet Take 1 tablet (100 mg total) by mouth daily. Take one tab po qd  . meloxicam (MOBIC) 15 MG tablet Take 1 tablet (15 mg total) by mouth daily.  . methocarbamol (ROBAXIN) 500 MG tablet Take 1 tablet (500 mg total) by mouth 4 (four) times daily.  . montelukast (SINGULAIR) 10 MG tablet Take 1 tablet (10 mg total) by mouth at bedtime.  . nitroGLYCERIN (NITROSTAT) 0.4 MG SL tablet Place 1 tablet (0.4 mg total) under the tongue every 5 (five) minutes x 3 doses as needed for chest pain. *If no relief call MD or go  to Emergency Room*  . oxybutynin (DITROPAN-XL) 5 MG 24 hr tablet Take 1 tablet (5 mg total) by mouth daily with supper.  . ranolazine (RANEXA) 500 MG 12 hr tablet Take 500 mg by mouth 2 (two) times daily.  . sertraline (ZOLOFT) 100 MG tablet Take 1 tablet (100 mg total) by mouth daily.  . silver sulfADIAZINE (SILVADENE) 1 % cream Apply 1 application topically daily.  . simvastatin (ZOCOR) 40 MG tablet Take 40 mg by mouth daily.  . traMADol (ULTRAM) 50 MG tablet Take 50 mg by mouth every 6 (six) hours.  . TRULICITY 1.5 QZ/0.0PQ SOPN INJECT 1.5MG INTO THE  SKIN ONCE A WEEK  . Ubrogepant (UBRELVY) 100 MG TABS Take 100 mg by mouth daily as needed.  . Vitamin D, Ergocalciferol, (DRISDOL) 1.25 MG (50000 UNIT) CAPS capsule Take 1 capsule (50,000 Units total) by mouth every 7 (seven) days.  Marland Kitchen zolpidem (AMBIEN) 10 MG tablet Take 1 tablet (10 mg total) by mouth at bedtime.  . [DISCONTINUED] doxycycline (VIBRA-TABS) 100 MG tablet Take 1 tablet (100 mg total) by mouth 2 (two) times daily. With food for skin infection   No facility-administered encounter medications on file as of 08/01/2020.    Surgical History: Past Surgical History:  Procedure Laterality Date  . ABDOMINAL HYSTERECTOMY    . CARDIAC CATHETERIZATION    . CARDIAC CATHETERIZATION N/A 10/18/2015   Procedure: Left Heart Cath and Cors/Grafts Angiography;  Surgeon: Lorretta Harp, MD;  Location: [facility];  Service: Cardiovascular;  Laterality: N/A;  . CARDIAC CATHETERIZATION N/A 10/18/2015   Procedure: Coronary Stent Intervention;  Surgeon: Lorretta Harp, MD;  Location: [facility];  Service: Cardiovascular;  Laterality: N/A;  . CARDIAC SURGERY    . CHOLECYSTECTOMY    . CORONARY ANGIOPLASTY    . CORONARY ARTERY BYPASS GRAFT     4 vessels - 2010  . CORONARY STENT INTERVENTION N/A 02/16/2017   Procedure: CORONARY STENT INTERVENTION;  Surgeon: Yolonda Kida, MD;  Location: [facility];  Service: Cardiovascular;  Laterality: N/A;  . CORONARY STENT INTERVENTION N/A 02/13/2019   Procedure: CORONARY STENT INTERVENTION;  Surgeon: Nelva Bush, MD;  Location: [facility];  Service: Cardiovascular;  Laterality: N/A;  SVG to RCA  . CORONARY STENT INTERVENTION Left 03/08/2019   Procedure: CORONARY STENT INTERVENTION;  Surgeon: Yolonda Kida, MD;  Location: [facility];  Service: Cardiovascular;  Laterality: Left;  . LEFT HEART CATH AND CORONARY ANGIOGRAPHY Left 02/16/2017   Procedure: LEFT HEART CATH AND CORONARY ANGIOGRAPHY;  Surgeon: Corey Skains, MD;  Location: [facility];  Service: Cardiovascular;  Laterality: Left;  . LEFT HEART CATH AND CORS/GRAFTS ANGIOGRAPHY Left 11/23/2017   Procedure: LEFT HEART CATH AND CORS/GRAFTS ANGIOGRAPHY;  Surgeon: Corey Skains, MD;  Location: [facility];  Service: Cardiovascular;  Laterality: Left;  . LEFT HEART CATH AND CORS/GRAFTS ANGIOGRAPHY N/A 02/13/2019   Procedure: LEFT HEART CATH AND CORS/GRAFTS ANGIOGRAPHY;  Surgeon: Corey Skains, MD;  Location: [facility];  Service: Cardiovascular;  Laterality: N/A;  . LEFT HEART CATH AND CORS/GRAFTS ANGIOGRAPHY N/A 07/03/2019   Procedure: LEFT HEART CATH AND CORS/GRAFTS ANGIOGRAPHY;  Surgeon: Corey Skains, MD;  Location: [facility];  Service: Cardiovascular;  Laterality: N/A;    Medical History: Past Medical History:  Diagnosis Date  . Asthma   . Coronary artery disease   . Diabetes mellitus without complication ([place])   .  Heart attack ([place])   . Hyperlipidemia   . Hypertension   . MI (myocardial infarction) ([place])   . Migraine headache with aura   . 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    Family History: Family History  Problem Relation Age of Onset  . Diabetes Mother   . Diabetes Father   . Cancer Father   . Diabetes Brother     Social History   Socioeconomic History  . Marital status: Married    Spouse name: Jeneen Rinks  . Number of children: Not on file  . Years of education: Not on file  . Highest education level: Not on file  Occupational History  . Not on file  Tobacco Use  . Smoking status: Never Smoker  . Smokeless tobacco: Never Used  Vaping Use  . Vaping Use: Never used  Substance and Sexual Activity  . Alcohol use: No    Alcohol/week: 0.0 standard drinks  . Drug use: No  . Sexual activity: Yes  Other Topics Concern  . Not on file  Social History Narrative  . Not on file   Social Determinants of Health   Financial Resource Strain: Not on file  Food Insecurity:  Not on file  Transportation Needs: Not on file  Physical Activity: Not on file  Stress: Not on file  Social Connections: Not on file  Intimate Partner Violence: Not on file      Review of Systems  Constitutional: Negative for chills, fatigue and fever.  HENT: Positive for congestion, postnasal drip, rhinorrhea, sinus pressure and sinus pain. Negative for mouth sores.   Respiratory: Positive for cough. Negative for shortness of breath and wheezing.   Cardiovascular: Negative for chest pain.  Genitourinary: Negative for flank pain.  Neurological: Negative for headaches.  Psychiatric/Behavioral: Negative.     Vital Signs: Temp (!) 97.1 F (36.2 C)   Ht '5\' 7"'  (1.702 m)   Wt 251 lb (113.9 kg)   BMI 39.31 kg/m    Observation/Objective:   Pt is congested and coughing, but able to carry out conversation.   Assessment/Plan: 1. Acute non-recurrent maxillary sinusitis Recommended she try taking OTC mucinex to help with congestion. Educated her to say well hydrated and f/u if not improving after starting the following: - benzonatate (TESSALON) 100 MG capsule; Take 1 capsule (100 mg total) by mouth 2 (two) times daily as needed for cough.  Dispense: 20 capsule; Refill: 0 - azithromycin (ZITHROMAX) 250 MG tablet; Use as directed  Dispense: 6 tablet; Refill: 0  2. Cough She will try tessalon pearls to help with her cough and let her sleep better. - benzonatate (TESSALON) 100 MG capsule; Take 1 capsule (100 mg total) by mouth 2 (two) times daily as needed for cough.  Dispense: 20 capsule; Refill: 0 - azithromycin (ZITHROMAX) 250 MG tablet; Use as directed  Dispense: 6 tablet; Refill: 0  General Counseling: Saphyre verbalizes understanding of the findings of today's phone visit and agrees with plan of treatment. I have discussed any further diagnostic evaluation that may be needed or ordered today. We also reviewed her medications today. she has been encouraged to call the office with any  questions or concerns that should arise related to todays visit.     Meds ordered this encounter  Medications  . benzonatate (TESSALON) 100 MG capsule    Sig: Take 1 capsule (100 mg total) by mouth 2 (two) times daily as needed for cough.    Dispense:  20 capsule    Refill:  0  . azithromycin (ZITHROMAX) 250 MG tablet    Sig: Use as directed    Dispense:  6 tablet    Refill:  0    Time spent: 63 Minutes    Dr Lavera Guise Internal medicine

## 2020-08-20 ENCOUNTER — Encounter: Payer: Self-pay | Admitting: Hospice and Palliative Medicine

## 2020-08-20 ENCOUNTER — Ambulatory Visit (INDEPENDENT_AMBULATORY_CARE_PROVIDER_SITE_OTHER): Payer: Medicare Other | Admitting: Hospice and Palliative Medicine

## 2020-08-20 ENCOUNTER — Other Ambulatory Visit: Payer: Self-pay

## 2020-08-20 VITALS — BP 124/74 | HR 80 | Temp 97.2°F | Resp 16 | Ht 67.0 in | Wt 256.8 lb

## 2020-08-20 DIAGNOSIS — E1165 Type 2 diabetes mellitus with hyperglycemia: Secondary | ICD-10-CM

## 2020-08-20 DIAGNOSIS — I2510 Atherosclerotic heart disease of native coronary artery without angina pectoris: Secondary | ICD-10-CM

## 2020-08-20 DIAGNOSIS — I10 Essential (primary) hypertension: Secondary | ICD-10-CM | POA: Diagnosis not present

## 2020-08-20 DIAGNOSIS — J0141 Acute recurrent pansinusitis: Secondary | ICD-10-CM | POA: Diagnosis not present

## 2020-08-20 DIAGNOSIS — R300 Dysuria: Secondary | ICD-10-CM

## 2020-08-20 DIAGNOSIS — R5383 Other fatigue: Secondary | ICD-10-CM

## 2020-08-20 DIAGNOSIS — Z0001 Encounter for general adult medical examination with abnormal findings: Secondary | ICD-10-CM | POA: Diagnosis not present

## 2020-08-20 LAB — POCT GLYCOSYLATED HEMOGLOBIN (HGB A1C): Hemoglobin A1C: 7.6 % — AB (ref 4.0–5.6)

## 2020-08-20 MED ORDER — LOSARTAN POTASSIUM 100 MG PO TABS
100.0000 mg | ORAL_TABLET | Freq: Every day | ORAL | 1 refills | Status: DC
Start: 2020-08-20 — End: 2021-09-03

## 2020-08-20 MED ORDER — DAPAGLIFLOZIN PROPANEDIOL 10 MG PO TABS: 10.0000 mg | ORAL_TABLET | Freq: Every day | ORAL | 3 refills | Status: DC

## 2020-08-20 MED ORDER — AMLODIPINE BESYLATE 10 MG PO TABS: 10.0000 mg | ORAL_TABLET | Freq: Every day | ORAL | 1 refills | Status: DC

## 2020-08-20 MED ORDER — ACCU-CHEK SOFTCLIX LANCETS MISC
3 refills | Status: DC
Start: 2020-08-20 — End: 2022-04-22

## 2020-08-20 MED ORDER — ACCU-CHEK GUIDE VI STRP: ORAL_STRIP | 3 refills | Status: DC

## 2020-08-20 MED ORDER — FUROSEMIDE 40 MG PO TABS
40.0000 mg | ORAL_TABLET | Freq: Two times a day (BID) | ORAL | 1 refills | Status: DC
Start: 2020-08-20 — End: 2021-09-03

## 2020-08-20 NOTE — Progress Notes (Signed)
[facility] [place], [place] [address]  Internal MEDICINE  Office Visit Note  Patient Name: Felicia Woods  [id number]  [id number]  Date of Service: 08/23/2020  Chief Complaint  Patient presents with  . Medicare Wellness    Still has cough, meds not helping  . Diabetes  . Hyperlipidemia  . Asthma     HPI Pt is here for routine health maintenance examination Has not been taking [place] due to increased cost--Invokana covered by insurance She has had a rough last couple of months as her son and his family had COVID--her son spent several weeks in the hospital, she was traveling back and forth to visit her son Developed a cough with congestion last month--treated with azithromycin as well as benzonatate--coughing remains and not relieved with benzonatate Requesting refills on her mediations No recent changes to her medications or history Burn on her breast has healed Followed by cardiology for CAD--scheduled for office visit follow-up next month, known diastolic dysfunction, at last visit medical management recommended Reports she is sleeping well, unsure as to having a history of OSA and is unaware if she snores at night, feels rested throughout the day  PHM: Mammogram completed December 2021-normal, will need repeat imaging later this year Documented last colonoscopy 2014--documentation not available in system, she feels that she was to have repeat screening last year  Current Medication: Outpatient Encounter Medications as of 08/20/2020  Medication Sig  . [DISCONTINUED] dapagliflozin propanediol (FARXIGA) 10 MG TABS tablet Take 1 tablet (10 mg total) by mouth daily before breakfast.  . Accu-Chek Softclix Lancets lancets Use as instructed twice a day to check blood sugars  DIAG -E11.59  . albuterol (PROVENTIL HFA;VENTOLIN HFA) 108 (90 Base) MCG/ACT inhaler Inhale 2 puffs into the lungs every 6 (six) hours as needed for wheezing or shortness of breath.   Marland Kitchen amLODipine (NORVASC) 10 MG tablet Take 1 tablet (10 mg total) by mouth daily.  Marland Kitchen aspirin EC 81 MG tablet Take 81 mg by mouth daily.  Marland Kitchen atorvastatin (LIPITOR) 80 MG tablet Take 80 mg by mouth daily.  . bisoprolol-hydrochlorothiazide (ZIAC) 5-6.25 MG tablet Take 1 tablet by mouth daily.  . clindamycin (CLEOCIN) 300 MG capsule Take 300 mg by mouth 3 (three) times daily.  . clopidogrel (PLAVIX) 75 MG tablet Take 1 tablet (75 mg total) by mouth daily.  . cyclobenzaprine (FLEXERIL) 10 MG tablet Take 1 tablet (10 mg total) by mouth 3 (three) times daily as needed for muscle spasms.  . furosemide (LASIX) 40 MG tablet Take 1 tablet (40 mg total) by mouth 2 (two) times daily.  Marland Kitchen glucose blood (ACCU-CHEK GUIDE) test strip Use as instructed to check blood sugars twice a day  E11.59  . HYDROcodone-acetaminophen (NORCO/VICODIN) 5-325 MG tablet TAKE 1 TABLET BY MOUTH EVERY 4 TO 6 HOURS AS NEEDED FOR PAIN  . influenza vac recom quadrivalent (FLUBLOK QUADRIVALENT) 0.5 ML injection Flublok Quad 2020-2021 (PF) 180 mcg (45 mcg x 4)/0.5 mL IM syringe  PHARMACIST ADMINISTERED IMMUNIZATION ADMINISTERED AT TIME OF DISPENSING  . isosorbide mononitrate (IMDUR) 30 MG 24 hr tablet Take 1 tablet (30 mg total) by mouth daily.  Marland Kitchen losartan (COZAAR) 100 MG tablet Take 1 tablet (100 mg total) by mouth daily. Take one tab po qd  . meloxicam (MOBIC) 15 MG tablet Take 1 tablet (15 mg total) by mouth daily.  . methocarbamol (ROBAXIN) 500 MG tablet Take 1 tablet (500 mg total) by mouth 4 (four) times daily.  . montelukast (SINGULAIR)  10 MG tablet Take 1 tablet (10 mg total) by mouth at bedtime.  . nitroGLYCERIN (NITROSTAT) 0.4 MG SL tablet Place 1 tablet (0.4 mg total) under the tongue every 5 (five) minutes x 3 doses as needed for chest pain. *If no relief call MD or go to Emergency Room*  . oxybutynin (DITROPAN-XL) 5 MG 24 hr tablet Take 1 tablet (5 mg total) by mouth daily with supper.  . ranolazine (RANEXA) 500 MG 12 hr tablet  Take 500 mg by mouth 2 (two) times daily.  . sertraline (ZOLOFT) 100 MG tablet Take 1 tablet (100 mg total) by mouth daily.  . silver sulfADIAZINE (SILVADENE) 1 % cream Apply 1 application topically daily.  . simvastatin (ZOCOR) 40 MG tablet Take 40 mg by mouth daily.  . traMADol (ULTRAM) 50 MG tablet Take 50 mg by mouth every 6 (six) hours.  . TRULICITY 1.5 ZO/1.0RU SOPN INJECT 1.5MG INTO THE SKIN ONCE A WEEK  . Ubrogepant (UBRELVY) 100 MG TABS Take 100 mg by mouth daily as needed.  . Vitamin D, Ergocalciferol, (DRISDOL) 1.25 MG (50000 UNIT) CAPS capsule Take 1 capsule (50,000 Units total) by mouth every 7 (seven) days.  Marland Kitchen zolpidem (AMBIEN) 10 MG tablet Take 1 tablet (10 mg total) by mouth at bedtime.  . [DISCONTINUED] Accu-Chek Softclix Lancets lancets Use as instructed twice a day to check blood sugars  DIAG -E11.59  . [DISCONTINUED] amLODipine (NORVASC) 10 MG tablet Take 1 tablet (10 mg total) by mouth daily.  . [DISCONTINUED] azithromycin (ZITHROMAX) 250 MG tablet Use as directed  . [DISCONTINUED] benzonatate (TESSALON) 100 MG capsule Take 1 capsule (100 mg total) by mouth 2 (two) times daily as needed for cough.  . [DISCONTINUED] furosemide (LASIX) 40 MG tablet Take 1 tablet (40 mg total) by mouth 2 (two) times daily.  . [DISCONTINUED] glucose blood (ACCU-CHEK GUIDE) test strip Use as instructed to check blood sugars twice a day  E11.59  . [DISCONTINUED] losartan (COZAAR) 100 MG tablet Take 1 tablet (100 mg total) by mouth daily. Take one tab po qd   No facility-administered encounter medications on file as of 08/20/2020.    Surgical History: Past Surgical History:  Procedure Laterality Date  . ABDOMINAL HYSTERECTOMY    . CARDIAC CATHETERIZATION    . CARDIAC CATHETERIZATION N/A 10/18/2015   Procedure: Left Heart Cath and Cors/Grafts Angiography;  Surgeon: Lorretta Harp, MD;  Location: [place] [facility];  Service: Cardiovascular;  Laterality: N/A;  . CARDIAC CATHETERIZATION N/A  10/18/2015   Procedure: Coronary Stent Intervention;  Surgeon: Lorretta Harp, MD;  Location: [place] [facility];  Service: Cardiovascular;  Laterality: N/A;  . CARDIAC SURGERY    . CHOLECYSTECTOMY    . CORONARY ANGIOPLASTY    . CORONARY ARTERY BYPASS GRAFT     4 vessels - 2010  . CORONARY STENT INTERVENTION N/A 02/16/2017   Procedure: CORONARY STENT INTERVENTION;  Surgeon: Yolonda Kida, MD;  Location: [place] [facility];  Service: Cardiovascular;  Laterality: N/A;  . CORONARY STENT INTERVENTION N/A 02/13/2019   Procedure: CORONARY STENT INTERVENTION;  Surgeon: Nelva Bush, MD;  Location: [place] [facility];  Service: Cardiovascular;  Laterality: N/A;  SVG to RCA  . CORONARY STENT INTERVENTION Left 03/08/2019   Procedure: CORONARY STENT INTERVENTION;  Surgeon: Yolonda Kida, MD;  Location: [place] [facility];  Service: Cardiovascular;  Laterality: Left;  . LEFT HEART CATH AND CORONARY ANGIOGRAPHY Left 02/16/2017   Procedure: LEFT HEART CATH AND CORONARY ANGIOGRAPHY;  Surgeon: Corey Skains, MD;  Location: [place] [facility];  Service: Cardiovascular;  Laterality: Left;  . LEFT HEART CATH AND CORS/GRAFTS ANGIOGRAPHY Left 11/23/2017   Procedure: LEFT HEART CATH AND CORS/GRAFTS ANGIOGRAPHY;  Surgeon: Corey Skains, MD;  Location: [place] [facility];  Service: Cardiovascular;  Laterality: Left;  . LEFT HEART CATH AND CORS/GRAFTS ANGIOGRAPHY N/A 02/13/2019   Procedure: LEFT HEART CATH AND CORS/GRAFTS ANGIOGRAPHY;  Surgeon: Corey Skains, MD;  Location: [place] [facility];  Service: Cardiovascular;  Laterality: N/A;  . LEFT HEART CATH AND CORS/GRAFTS ANGIOGRAPHY N/A 07/03/2019   Procedure: LEFT HEART CATH AND CORS/GRAFTS ANGIOGRAPHY;  Surgeon: Corey Skains, MD;  Location: [place] [facility];  Service: Cardiovascular;  Laterality: N/A;    Medical History: Past Medical History:  Diagnosis Date  . Asthma   . Coronary artery disease   . Diabetes mellitus  without complication ([place])   . Heart attack ([place])   . Hyperlipidemia   . Hypertension   . MI (myocardial infarction) ([place])   . Migraine headache with aura   . 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    Family History: Family History  Problem Relation Age of Onset  . Diabetes Mother   . Diabetes Father   . Cancer Father   . Diabetes Brother       Review of Systems  Constitutional: Negative for chills, diaphoresis and fatigue.  HENT: Positive for congestion. Negative for ear pain, postnasal drip and sinus pressure.   Eyes: Negative for photophobia, discharge, redness, itching and visual disturbance.  Respiratory: Positive for cough. Negative for shortness of breath and wheezing.   Cardiovascular: Negative for chest pain, palpitations and leg swelling.  Gastrointestinal: Negative for abdominal pain, constipation, diarrhea, nausea and vomiting.  Genitourinary: Negative for dysuria and flank pain.  Musculoskeletal: Negative for arthralgias, back pain, gait problem and neck pain.  Skin: Negative for color change.  Allergic/Immunologic: Negative for environmental allergies and food allergies.  Neurological: Negative for dizziness and headaches.  Hematological: Does not bruise/bleed easily.  Psychiatric/Behavioral: Negative for agitation, behavioral problems (depression) and hallucinations.     Vital Signs: BP 124/74   Pulse 80   Temp (!) 97.2 F (36.2 C)   Resp 16   Ht '5\' 7"'  (1.702 m)   Wt 256 lb 12.8 oz (116.5 kg)   SpO2 99%   BMI 40.22 kg/m    Physical Exam Vitals reviewed.  Constitutional:      Appearance: Normal appearance. She is obese.  Cardiovascular:     Rate and Rhythm: Normal rate and regular rhythm.     Pulses: Normal pulses.     Heart sounds: Normal heart sounds.  Pulmonary:     Effort: Pulmonary effort is normal.     Breath sounds: Normal breath sounds.  Abdominal:     General: Abdomen is flat.     Palpations: Abdomen is soft.  Musculoskeletal:         General: Normal range of motion.     Cervical back: Normal range of motion.  Skin:    General: Skin is warm.  Neurological:     General: No focal deficit present.     Mental Status: She is alert and oriented to person, place, and time. Mental status is at baseline.  Psychiatric:        Mood and Affect: Mood normal.        Behavior: Behavior normal.        Thought Content: Thought content  normal.        Judgment: Judgment normal.      LABS: Recent Results (from the past 2160 hour(s))  POCT HgB A1C     Status: Abnormal   Collection Time: 08/20/20  8:46 AM  Result Value Ref Range   Hemoglobin A1C 7.6 (A) 4.0 - 5.6 %   HbA1c POC (<> result, manual entry)     HbA1c, POC (prediabetic range)     HbA1c, POC (controlled diabetic range)    UA/M w/rflx Culture, Routine     Status: Abnormal   Collection Time: 08/20/20  9:24 AM   Specimen: Urine   Urine  Result Value Ref Range   Specific Gravity, UA 1.021 1.005 - 1.030   pH, UA 5.5 5.0 - 7.5   Color, UA Yellow Yellow   Appearance Ur Turbid (A) Clear   Leukocytes,UA Negative Negative   Protein,UA Negative Negative/Trace   Glucose, UA Negative Negative   Ketones, UA Negative Negative   RBC, UA Negative Negative   Bilirubin, UA Negative Negative   Urobilinogen, Ur 1.0 0.2 - 1.0 mg/dL   Nitrite, UA Negative Negative   Microscopic Examination Comment     Comment: Microscopic follows if indicated.   Microscopic Examination See below:     Comment: Microscopic was indicated and was performed.   Urinalysis Reflex Comment     Comment: This specimen will not reflex to a Urine Culture.  Microscopic Examination     Status: None   Collection Time: 08/20/20  9:24 AM   Urine  Result Value Ref Range   WBC, UA None seen 0 - 5 /hpf   RBC None seen 0 - 2 /hpf   Epithelial Cells (non renal) 0-10 0 - 10 /hpf   Casts None seen None seen /lpf   Bacteria, UA None seen None seen/Few   Assessment/Plan: 1. Encounter for routine adult health  examination with abnormal findings Well appearing 66 year old [place] female with multiple comorbid conditions Will review routine labs and adjust plan of care as indicated Will need to further look into when colonoscopy needs to be repeated Mammogram up to date - CBC w/Diff/Platelet - Comprehensive Metabolic Panel (CMET) - Lipid Panel With LDL/HDL Ratio - TSH + free T4 - Vitamin D (25 hydroxy) - B12  2. Uncontrolled type 2 diabetes mellitus with hyperglycemia (HCC) A1C 7.6 today, has not been taking [place] as she was unable to afford Will start Invokana, continue with Trulicity - POCT HgB H2Z - glucose blood (ACCU-CHEK GUIDE) test strip; Use as instructed to check blood sugars twice a day  E11.59  Dispense: 100 each; Refill: 3 - Accu-Chek Softclix Lancets lancets; Use as instructed twice a day to check blood sugars  DIAG -E11.59  Dispense: 100 each; Refill: 3  3. Essential hypertension BP and HR well controlled today, requesting refills - amLODipine (NORVASC) 10 MG tablet; Take 1 tablet (10 mg total) by mouth daily.  Dispense: 90 tablet; Refill: 1 - losartan (COZAAR) 100 MG tablet; Take 1 tablet (100 mg total) by mouth daily. Take one tab po qd  Dispense: 90 tablet; Refill: 1 - furosemide (LASIX) 40 MG tablet; Take 1 tablet (40 mg total) by mouth 2 (two) times daily.  Dispense: 180 tablet; Refill: 1  4. Acute recurrent pansinusitis Samples of Mucinex as well as Delsym given in office today and recommended Cepacol cough drops Again, will pursue pulmonary evaluation is symptoms continue and following cardiology recommendations//PFT testing, OSA testing  5. Coronary artery disease  involving native heart without angina pectoris, unspecified vessel or lesion type Followed by cardiology, scheduled for follow-up office visit next month Will review cardiology recommendations and pursue pulmonary evaluation as indicated  6. Dysuria - UA/M w/rflx Culture, Routine - Microscopic Examination  7.  Other fatigue - CBC w/Diff/Platelet - Comprehensive Metabolic Panel (CMET) - Lipid Panel With LDL/HDL Ratio - TSH + free T4 - Vitamin D (25 hydroxy) - B12  8. Morbid obesity BMI 40 Obesity Counseling: Risk Assessment: An assessment of behavioral risk factors was made today and includes lack of exercise sedentary lifestyle, lack of portion control and poor dietary habits.  Risk Modification Advice: She was counseled on portion control guidelines. Restricting daily caloric intake to 1800. The detrimental long term effects of obesity on her health and ongoing poor compliance was also discussed with the patient.  General Counseling: Quanta verbalizes understanding of the findings of todays visit and agrees with plan of treatment. I have discussed any further diagnostic evaluation that may be needed or ordered today. We also reviewed her medications today. she has been encouraged to call the office with any questions or concerns that should arise related to todays visit.    Counseling:    Orders Placed This Encounter  Procedures  . Microscopic Examination  . UA/M w/rflx Culture, Routine  . CBC w/Diff/Platelet  . Comprehensive Metabolic Panel (CMET)  . Lipid Panel With LDL/HDL Ratio  . TSH + free T4  . Vitamin D (25 hydroxy)  . B12  . POCT HgB A1C    Meds ordered this encounter  Medications  . DISCONTD: dapagliflozin propanediol (FARXIGA) 10 MG TABS tablet    Sig: Take 1 tablet (10 mg total) by mouth daily before breakfast.    Dispense:  30 tablet    Refill:  3  . amLODipine (NORVASC) 10 MG tablet    Sig: Take 1 tablet (10 mg total) by mouth daily.    Dispense:  90 tablet    Refill:  1  . losartan (COZAAR) 100 MG tablet    Sig: Take 1 tablet (100 mg total) by mouth daily. Take one tab po qd    Dispense:  90 tablet    Refill:  1  . furosemide (LASIX) 40 MG tablet    Sig: Take 1 tablet (40 mg total) by mouth 2 (two) times daily.    Dispense:  180 tablet    Refill:  1  .  glucose blood (ACCU-CHEK GUIDE) test strip    Sig: Use as instructed to check blood sugars twice a day  E11.59    Dispense:  100 each    Refill:  3  . Accu-Chek Softclix Lancets lancets    Sig: Use as instructed twice a day to check blood sugars  DIAG -E11.59    Dispense:  100 each    Refill:  3    Total time spent: 40 Minutes  Time spent includes review of chart, medications, test results, and follow up plan with the patient.   This patient was seen by Theodoro Grist AGNP-C Collaboration with Dr Lavera Guise as a part of collaborative care agreement   Tanna Furry. [facility] Internal Medicine

## 2020-08-21 LAB — MICROSCOPIC EXAMINATION
Bacteria, UA: NONE SEEN
Casts: NONE SEEN /lpf
RBC, Urine: NONE SEEN /hpf (ref 0–2)
WBC, UA: NONE SEEN /hpf (ref 0–5)

## 2020-08-21 LAB — UA/M W/RFLX CULTURE, ROUTINE
Bilirubin, UA: NEGATIVE
Glucose, UA: NEGATIVE
Ketones, UA: NEGATIVE
Leukocytes,UA: NEGATIVE
Nitrite, UA: NEGATIVE
Protein,UA: NEGATIVE
RBC, UA: NEGATIVE
Specific Gravity, UA: 1.021 (ref 1.005–1.030)
Urobilinogen, Ur: 1 mg/dL (ref 0.2–1.0)
pH, UA: 5.5 (ref 5.0–7.5)

## 2020-08-23 ENCOUNTER — Encounter: Payer: Self-pay | Admitting: Hospice and Palliative Medicine

## 2020-08-28 ENCOUNTER — Other Ambulatory Visit: Payer: Self-pay

## 2020-08-28 DIAGNOSIS — I119 Hypertensive heart disease without heart failure: Secondary | ICD-10-CM

## 2020-08-28 MED ORDER — NITROGLYCERIN 0.4 MG SL SUBL: 0.4000 mg | SUBLINGUAL_TABLET | SUBLINGUAL | 0 refills | Status: DC | PRN

## 2020-09-20 ENCOUNTER — Other Ambulatory Visit: Payer: Self-pay | Admitting: Adult Health

## 2020-09-20 DIAGNOSIS — G4709 Other insomnia: Secondary | ICD-10-CM

## 2020-10-08 ENCOUNTER — Other Ambulatory Visit: Payer: Self-pay | Admitting: Hospice and Palliative Medicine

## 2020-10-08 DIAGNOSIS — G4709 Other insomnia: Secondary | ICD-10-CM

## 2020-10-08 MED ORDER — ZOLPIDEM TARTRATE 10 MG PO TABS: 10.0000 mg | ORAL_TABLET | Freq: Every day | ORAL | 1 refills | Status: DC

## 2020-10-20 ENCOUNTER — Emergency Department: Payer: Medicare Other

## 2020-10-20 ENCOUNTER — Other Ambulatory Visit: Payer: Self-pay

## 2020-10-20 ENCOUNTER — Encounter: Payer: Self-pay | Admitting: Emergency Medicine

## 2020-10-20 ENCOUNTER — Emergency Department
Admission: EM | Admit: 2020-10-20 | Discharge: 2020-10-20 | Disposition: A | Discharge status: Home / Self Care | Payer: Medicare Other | Attending: Emergency Medicine | Admitting: Emergency Medicine

## 2020-10-20 DIAGNOSIS — Z79899 Other long term (current) drug therapy: Secondary | ICD-10-CM | POA: Insufficient documentation

## 2020-10-20 DIAGNOSIS — E876 Hypokalemia: Secondary | ICD-10-CM | POA: Diagnosis not present

## 2020-10-20 DIAGNOSIS — Z7902 Long term (current) use of antithrombotics/antiplatelets: Secondary | ICD-10-CM | POA: Diagnosis not present

## 2020-10-20 DIAGNOSIS — Z794 Long term (current) use of insulin: Secondary | ICD-10-CM | POA: Diagnosis not present

## 2020-10-20 DIAGNOSIS — I5033 Acute on chronic diastolic (congestive) heart failure: Secondary | ICD-10-CM | POA: Diagnosis not present

## 2020-10-20 DIAGNOSIS — I2510 Atherosclerotic heart disease of native coronary artery without angina pectoris: Secondary | ICD-10-CM | POA: Diagnosis not present

## 2020-10-20 DIAGNOSIS — I110 Hypertensive heart disease with heart failure: Secondary | ICD-10-CM | POA: Insufficient documentation

## 2020-10-20 DIAGNOSIS — R0789 Other chest pain: Secondary | ICD-10-CM | POA: Diagnosis not present

## 2020-10-20 DIAGNOSIS — Z951 Presence of aortocoronary bypass graft: Secondary | ICD-10-CM | POA: Insufficient documentation

## 2020-10-20 DIAGNOSIS — J45909 Unspecified asthma, uncomplicated: Secondary | ICD-10-CM | POA: Insufficient documentation

## 2020-10-20 DIAGNOSIS — E1159 Type 2 diabetes mellitus with other circulatory complications: Secondary | ICD-10-CM | POA: Diagnosis not present

## 2020-10-20 DIAGNOSIS — Z7982 Long term (current) use of aspirin: Secondary | ICD-10-CM | POA: Diagnosis not present

## 2020-10-20 DIAGNOSIS — Z955 Presence of coronary angioplasty implant and graft: Secondary | ICD-10-CM | POA: Insufficient documentation

## 2020-10-20 DIAGNOSIS — R079 Chest pain, unspecified: Secondary | ICD-10-CM | POA: Diagnosis present

## 2020-10-20 LAB — CBC WITH DIFFERENTIAL/PLATELET
Abs Immature Granulocytes: 0.03 10*3/uL (ref 0.00–0.07)
Basophils Absolute: 0 10*3/uL (ref 0.0–0.1)
Basophils Relative: 0 %
Eosinophils Absolute: 0 10*3/uL (ref 0.0–0.5)
Eosinophils Relative: 1 %
HCT: 38.2 % (ref 36.0–46.0)
Hemoglobin: 12.4 g/dL (ref 12.0–15.0)
Immature Granulocytes: 0 %
Lymphocytes Relative: 21 %
Lymphs Abs: 1.9 10*3/uL (ref 0.7–4.0)
MCH: 25.9 pg — ABNORMAL LOW (ref 26.0–34.0)
MCHC: 32.5 g/dL (ref 30.0–36.0)
MCV: 79.7 fL — ABNORMAL LOW (ref 80.0–100.0)
Monocytes Absolute: 0.5 10*3/uL (ref 0.1–1.0)
Monocytes Relative: 5 %
Neutro Abs: 6.3 10*3/uL (ref 1.7–7.7)
Neutrophils Relative %: 73 %
Platelets: 278 10*3/uL (ref 150–400)
RBC: 4.79 MIL/uL (ref 3.87–5.11)
RDW: 16.9 % — ABNORMAL HIGH (ref 11.5–15.5)
WBC: 8.7 10*3/uL (ref 4.0–10.5)
nRBC: 0 % (ref 0.0–0.2)

## 2020-10-20 LAB — COMPREHENSIVE METABOLIC PANEL
ALT: 14 U/L (ref 0–44)
AST: 16 U/L (ref 15–41)
Albumin: 3.7 g/dL (ref 3.5–5.0)
Alkaline Phosphatase: 61 U/L (ref 38–126)
Anion gap: 10 (ref 5–15)
BUN: 16 mg/dL (ref 8–23)
CO2: 27 mmol/L (ref 22–32)
Calcium: 9.1 mg/dL (ref 8.9–10.3)
Chloride: 98 mmol/L (ref 98–111)
Creatinine, Ser: 0.75 mg/dL (ref 0.44–1.00)
GFR, Estimated: >60 mL/min (ref >60)
Glucose, Bld: 110 mg/dL — ABNORMAL HIGH (ref 70–99)
Potassium: 2.9 mmol/L — ABNORMAL LOW (ref 3.5–5.1)
Sodium: 135 mmol/L (ref 135–145)
Total Bilirubin: 0.8 mg/dL (ref 0.3–1.2)
Total Protein: 8.1 g/dL (ref 6.5–8.1)

## 2020-10-20 LAB — TROPONIN I (HIGH SENSITIVITY): Troponin I (High Sensitivity): 6 ng/L (ref <18)

## 2020-10-20 MED ORDER — POTASSIUM CHLORIDE ER 20 MEQ PO TBCR: 20.0000 meq | EXTENDED_RELEASE_TABLET | Freq: Every day | ORAL | 0 refills | Status: DC

## 2020-10-20 MED ORDER — OXYCODONE-ACETAMINOPHEN 5-325 MG PO TABS: 1.0000 | ORAL_TABLET | Freq: Four times a day (QID) | ORAL | 0 refills | Status: DC | PRN

## 2020-10-20 MED ORDER — POTASSIUM CHLORIDE CRYS ER 20 MEQ PO TBCR
40.0000 meq | EXTENDED_RELEASE_TABLET | Freq: Once | ORAL | Status: AC
  Administered 2020-10-20: 40 meq via ORAL
  Filled 2020-10-20: qty 2

## 2020-10-20 MED ORDER — OXYCODONE-ACETAMINOPHEN 5-325 MG PO TABS
1.0000 | ORAL_TABLET | Freq: Once | ORAL | Status: AC
  Administered 2020-10-20: 1 via ORAL
  Filled 2020-10-20: qty 1

## 2020-10-20 MED ORDER — METHOCARBAMOL 500 MG PO TABS: 500.0000 mg | ORAL_TABLET | Freq: Four times a day (QID) | ORAL | 0 refills | Status: DC | PRN

## 2020-10-20 NOTE — ED Triage Notes (Signed)
Presents with discomfort to right side of chest   States pain is worse with movement and inspiration  No fever or cough

## 2020-10-20 NOTE — ED Provider Notes (Signed)
[facility] Emergency Department Provider Note   ____________________________________________   Event Date/Time   First MD Initiated Contact with Patient 10/20/20 1129     (approximate)  I have reviewed the triage vital signs and the nursing notes.   HISTORY  Chief Complaint Chest Pain   HPI Felicia Woods is a 65 y.o. female presents to the ED with complaint of right-sided chest pain that began yesterday.  Patient states this morning it was much worse especially with movement and deep inspiration.  Patient states that she did not have an injury and she does not lift objects due to "a heart condition".  Patient denies any cough, fever, chills, nausea or vomiting.  She denies any known exposure to COVID.  Patient is vaccinated.  She denies smoking.  She has not take any over-the-counter medication for her pain.  This pain is an 8 out of 10.       Past Medical History:  Diagnosis Date  . Asthma   . Coronary artery disease   . Diabetes mellitus without complication ([place])   . Heart attack ([place])   . Hyperlipidemia   . Hypertension   . MI (myocardial infarction) ([place])   . Migraine headache with aura   . 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    Patient Active Problem List   Diagnosis Date Noted  . Acute non-recurrent maxillary sinusitis 08/01/2020  . Cough 08/01/2020  . Uncontrolled type 2 diabetes mellitus with hyperglycemia ([place]) 03/25/2020  . Neck pain 03/25/2020  . Other insomnia 03/25/2020  . BMI 39.0-39.9,adult 03/25/2020  . Atherosclerosis of aorta ([place]) 03/19/2020  . Unstable angina ([place]) 02/13/2019  . Acute bilateral low back pain without sciatica 08/21/2018  . Asthma 08/21/2018  . Coronary artery disease involving coronary bypass graft of native heart without angina pectoris 08/21/2018  . Depression, major, single episode, in partial remission ([place]) 08/21/2018  . Chest pain 11/02/2017  . S/P drug eluting coronary stent placement 02/16/2017   . Numbness and tingling 12/01/2016  . Chronic tension-type headache, intractable 12/01/2016  . Osteoarthritis of knee 11/12/2016  . Obesity, Class II, BMI 35-39.9 05/13/2016  . Hx of CABG 2010 11/12/2015  . Type 2 diabetes mellitus with circulatory disorder, without long-term current use of insulin ([place]) 11/12/2015  . Morbid obesity ([place]) 11/12/2015  . SOBOE (shortness of breath on exertion) 11/12/2015  . RBBB 11/12/2015  . ST elevation myocardial infarction (STEMI) of inferior wall ([place]) 11/03/2015  . SVG-RCA throbectomy and DES 10/18/15   . Hypertensive heart disease without heart failure   . Vertigo   . STEMI 10/18/15 10/18/2015  . NSTEMI (non-ST elevated myocardial infarction) ([place]) 10/18/2015  . Intractable chronic migraine without aura and without status migrainosus 05/08/2015  . Essential hypertension 03/17/2015  . Acute on chronic diastolic CHF (congestive heart failure), NYHA class 3 ([place]) 08/26/2014  . CAD (coronary artery disease) 08/26/2014  . Headache 08/06/2014  . Mixed hyperlipidemia 07/30/2014    Past Surgical History:  Procedure Laterality Date  . ABDOMINAL HYSTERECTOMY    . CARDIAC CATHETERIZATION    . CARDIAC CATHETERIZATION N/A 10/18/2015   Procedure: Left Heart Cath and Cors/Grafts Angiography;  Surgeon: Lorretta Harp, MD;  Location: [place] [facility];  Service: Cardiovascular;  Laterality: N/A;  . CARDIAC CATHETERIZATION N/A 10/18/2015   Procedure: Coronary Stent Intervention;  Surgeon: Lorretta Harp, MD;  Location: [place] [facility];  Service: Cardiovascular;  Laterality: N/A;  . CARDIAC SURGERY    . CHOLECYSTECTOMY    .  CORONARY ANGIOPLASTY    . CORONARY ARTERY BYPASS GRAFT     4 vessels - 2010  . CORONARY STENT INTERVENTION N/A 02/16/2017   Procedure: CORONARY STENT INTERVENTION;  Surgeon: Yolonda Kida, MD;  Location: [place] [facility];  Service: Cardiovascular;  Laterality: N/A;  . CORONARY STENT INTERVENTION N/A 02/13/2019   Procedure:  CORONARY STENT INTERVENTION;  Surgeon: Nelva Bush, MD;  Location: [place] [facility];  Service: Cardiovascular;  Laterality: N/A;  SVG to RCA  . CORONARY STENT INTERVENTION Left 03/08/2019   Procedure: CORONARY STENT INTERVENTION;  Surgeon: Yolonda Kida, MD;  Location: [place] [facility];  Service: Cardiovascular;  Laterality: Left;  . LEFT HEART CATH AND CORONARY ANGIOGRAPHY Left 02/16/2017   Procedure: LEFT HEART CATH AND CORONARY ANGIOGRAPHY;  Surgeon: Corey Skains, MD;  Location: [place] [facility];  Service: Cardiovascular;  Laterality: Left;  . LEFT HEART CATH AND CORS/GRAFTS ANGIOGRAPHY Left 11/23/2017   Procedure: LEFT HEART CATH AND CORS/GRAFTS ANGIOGRAPHY;  Surgeon: Corey Skains, MD;  Location: [place] [facility];  Service: Cardiovascular;  Laterality: Left;  . LEFT HEART CATH AND CORS/GRAFTS ANGIOGRAPHY N/A 02/13/2019   Procedure: LEFT HEART CATH AND CORS/GRAFTS ANGIOGRAPHY;  Surgeon: Corey Skains, MD;  Location: [place] [facility];  Service: Cardiovascular;  Laterality: N/A;  . LEFT HEART CATH AND CORS/GRAFTS ANGIOGRAPHY N/A 07/03/2019   Procedure: LEFT HEART CATH AND CORS/GRAFTS ANGIOGRAPHY;  Surgeon: Corey Skains, MD;  Location: [place] [facility];  Service: Cardiovascular;  Laterality: N/A;    Prior to Admission medications   Medication Sig Start Date End Date Taking? Authorizing Provider  methocarbamol (ROBAXIN) 500 MG tablet Take 1 tablet (500 mg total) by mouth every 6 (six) hours as needed. 10/20/20  Yes Johnn Hai, PA-C  oxyCODONE-acetaminophen (PERCOCET) 5-325 MG tablet Take 1 tablet by mouth every 6 (six) hours as needed for severe pain. 10/20/20 10/20/21 Yes Larcenia Holaday L, PA-C  potassium chloride 20 MEQ TBCR Take 20 mEq by mouth daily for 5 days. 10/20/20 10/25/20 Yes Johnn Hai, PA-C  Accu-Chek Softclix Lancets lancets Use as instructed twice a day to check blood sugars  DIAG -E11.59 08/20/20   Luiz Ochoa, NP   albuterol (PROVENTIL HFA;VENTOLIN HFA) 108 (90 Base) MCG/ACT inhaler Inhale 2 puffs into the lungs every 6 (six) hours as needed for wheezing or shortness of breath.    [provider]  amLODipine (NORVASC) 10 MG tablet Take 1 tablet (10 mg total) by mouth daily. 08/20/20   Luiz Ochoa, NP  aspirin EC 81 MG tablet Take 81 mg by mouth daily.    [provider]  atorvastatin (LIPITOR) 80 MG tablet Take 80 mg by mouth daily. 06/07/19   [provider]  bisoprolol-hydrochlorothiazide (ZIAC) 5-6.25 MG tablet Take 1 tablet by mouth daily. 02/04/20   Lavera Guise, MD  clindamycin (CLEOCIN) 300 MG capsule Take 300 mg by mouth 3 (three) times daily. 08/13/20   [provider]  clopidogrel (PLAVIX) 75 MG tablet Take 1 tablet (75 mg total) by mouth daily. 03/06/20   Ronnell Freshwater, NP  furosemide (LASIX) 40 MG tablet Take 1 tablet (40 mg total) by mouth 2 (two) times daily. 08/20/20   Luiz Ochoa, NP  glucose blood (ACCU-CHEK GUIDE) test strip Use as instructed to check blood sugars twice a day  E11.59 08/20/20   Luiz Ochoa, NP  HYDROcodone-acetaminophen (NORCO/VICODIN) 5-325 MG tablet TAKE 1 TABLET BY MOUTH EVERY  4 TO 6 HOURS AS NEEDED FOR PAIN 08/13/20   [provider]  influenza vac recom quadrivalent (FLUBLOK QUADRIVALENT) 0.5 ML injection Flublok Quad 2020-2021 (PF) 180 mcg (45 mcg x 4)/0.5 mL IM syringe  PHARMACIST ADMINISTERED IMMUNIZATION ADMINISTERED AT TIME OF DISPENSING    [provider]  isosorbide mononitrate (IMDUR) 30 MG 24 hr tablet Take 1 tablet (30 mg total) by mouth daily. 03/06/20   Ronnell Freshwater, NP  losartan (COZAAR) 100 MG tablet Take 1 tablet (100 mg total) by mouth daily. Take one tab po qd 08/20/20   Luiz Ochoa, NP  montelukast (SINGULAIR) 10 MG tablet Take 1 tablet (10 mg total) by mouth at bedtime. 02/04/20   Lavera Guise, MD  nitroGLYCERIN (NITROSTAT) 0.4 MG SL tablet Place 1 tablet (0.4 mg total) under the  tongue every 5 (five) minutes x 3 doses as needed for chest pain. *If no relief call MD or go to Emergency Room* 08/28/20   Luiz Ochoa, NP  oxybutynin (DITROPAN-XL) 5 MG 24 hr tablet Take 1 tablet (5 mg total) by mouth daily with supper. 02/04/20   Lavera Guise, MD  ranolazine (RANEXA) 500 MG 12 hr tablet Take 500 mg by mouth 2 (two) times daily. 06/07/19   [provider]  sertraline (ZOLOFT) 100 MG tablet Take 1 tablet (100 mg total) by mouth daily. 03/06/20   Ronnell Freshwater, NP  silver sulfADIAZINE (SILVADENE) 1 % cream Apply 1 application topically daily. 04/17/20   Lavera Guise, MD  simvastatin (ZOCOR) 40 MG tablet Take 40 mg by mouth daily.    [provider]  traMADol (ULTRAM) 50 MG tablet Take 50 mg by mouth every 6 (six) hours. 11/08/19   [provider]  TRULICITY 1.5 XK/4.8JE SOPN INJECT 1.5MG INTO THE SKIN ONCE A WEEK 12/26/19   [place]ar[place], Audie Clear, NP  Ubrogepant (UBRELVY) 100 MG TABS Take 100 mg by mouth daily as needed. 02/04/20   Lavera Guise, MD  Vitamin D, Ergocalciferol, (DRISDOL) 1.25 MG (50000 UNIT) CAPS capsule Take 1 capsule (50,000 Units total) by mouth every 7 (seven) days. 11/13/19   Kendell Bane, NP  zolpidem (AMBIEN) 10 MG tablet Take 1 tablet (10 mg total) by mouth at bedtime. 10/08/20   Luiz Ochoa, NP    Allergies Penicillin g  Family History  Problem Relation Age of Onset  . Diabetes Mother   . Diabetes Father   . Cancer Father   . Diabetes Brother     Social History Social History   Tobacco Use  . Smoking status: Never Smoker  . Smokeless tobacco: Never Used  Vaping Use  . Vaping Use: Never used  Substance Use Topics  . Alcohol use: No    Alcohol/week: 0.0 standard drinks  . Drug use: No    Review of Systems Constitutional: No fever/chills Eyes: No visual changes. ENT: No sore throat. Cardiovascular: Denies chest pain. Respiratory: Denies shortness of breath. Gastrointestinal: No abdominal pain.  No nausea, no  vomiting.  No diarrhea.   Genitourinary: Negative for dysuria. Musculoskeletal: Right-sided chest pain. Skin: Negative for rash. Neurological: Negative for headaches, focal weakness or numbness. ____________________________________________   PHYSICAL EXAM:  VITAL SIGNS: ED Triage Vitals  Enc Vitals Group     BP 10/20/20 1124 126/62     Pulse Rate 10/20/20 1124 72     Resp 10/20/20 1124 18     Temp 10/20/20 1124 98.2 F (36.8 C)  Temp Source 10/20/20 1124 Oral     SpO2 10/20/20 1124 98 %     Weight 10/20/20 1125 225 lb (102.1 kg)     Height 10/20/20 1125 _0  (1.702 m)     Head Circumference --      Peak Flow --      Pain Score 10/20/20 1128 8     Pain Loc --      Pain Edu? --      Excl. in [place]? --     Constitutional: Alert and oriented. Well appearing and in no acute distress. Eyes: Conjunctivae are normal.  Head: Atraumatic. Nose: No congestion/rhinnorhea. Neck: No stridor.   Cardiovascular: Normal rate, regular rhythm. Grossly normal heart sounds.  Good peripheral circulation. Respiratory: Normal respiratory effort.  No retractions. Lungs CTAB.  There is moderate tenderness on palpation of the ribs on the right especially laterally.  No evidence of bruise or deformity is noted.  Patient has increased pain with range of motion or deep inspiration. Gastrointestinal: Soft and nontender. No distention.  No epigastric or right upper quadrant pain is noted during exam on palpation. Musculoskeletal: Tenderness is noted on palpation of cervical, thoracic or lumbar spine.  Patient is able move upper and lower extremities without any difficulty.  No edema is noted lower extremities.  Patient is able to stand and ambulate without any assistance. Neurologic:  Normal speech and language. No gross focal neurologic deficits are appreciated. No gait instability. Skin:  Skin is warm, dry and intact. No rash noted. Psychiatric: Mood and affect are normal. Speech and behavior are  normal.  ____________________________________________   LABS (all labs ordered are listed, but only abnormal results are displayed)  Labs Reviewed  COMPREHENSIVE METABOLIC PANEL - Abnormal; Notable for the following components:      Result Value   Potassium 2.9 (*)    Glucose, Bld 110 (*)    All other components within normal limits  CBC WITH DIFFERENTIAL/PLATELET - Abnormal; Notable for the following components:   MCV 79.7 (*)    MCH 25.9 (*)    RDW 16.9 (*)    All other components within normal limits  TROPONIN I (HIGH SENSITIVITY)  TROPONIN I (HIGH SENSITIVITY)   ____________________________________________  EKG  Normal sinus rhythm with ventricular rate of 66.  Right bundle branch block.  This was compared with reading from EKG at [facility] cardiology department and was noted to be there previously. ____________________________________________  RADIOLOGY Leana Gamer, personally viewed and evaluated these images (plain radiographs) as part of my medical decision making, as well as reviewing the written report by the radiologist.   Official radiology report(s): DG Ribs Unilateral W/Chest Right  Result Date: 10/20/2020 CLINICAL DATA:  Right rib pain.  No known injury EXAM: RIGHT RIBS AND CHEST - 3+ VIEW COMPARISON:  07/02/2019 FINDINGS: No fracture or other bone lesions are seen involving the ribs. There is no evidence of pneumothorax or pleural effusion. Both lungs are clear. Heart size and mediastinal contours are within normal limits. Prior median sternotomy and CABG. IMPRESSION: Negative. Electronically Signed   By: Franchot Gallo M.D.   On: 10/20/2020 13:15    ____________________________________________   PROCEDURES  Procedure(s) performed (including Critical Care):  Procedures   ____________________________________________   INITIAL IMPRESSION / ASSESSMENT AND PLAN / ED COURSE  As part of my medical decision making, I reviewed the following  data within the electronic MEDICAL RECORD NUMBER Notes from prior ED visits and [place] Controlled Substance Database  65 year old female presents to the ED with complaint of right-sided chest pain that started yesterday without any history of injury.  Patient denies any fever, chills, nausea or vomiting or any symptoms of cough or injury to her chest wall.  Right rib x-ray and chest were negative for any acute changes.  It was noted on her lab work that she was hypokalemic and patient was given K. Dur 40 mEq p.o. while in the ED.  Also prescription for Percocet was given while in the ED.  Patient began getting some improvement of her symptoms.  We discussed use of a pain medication along with a muscle relaxant.  She is encouraged to use ice or heat to her muscles as needed for discomfort and with the increased pain with range of motion is felt to be more muscular since her x-rays did not show any abnormality of her ribs or chest x-ray.  Patient is to make an appointment with her PCP for follow-up of her hypokalemia.  Patient was reassured and a prescription for potassium, Percocet and methocarbamol was sent to her pharmacy.  She is to call and make an appointment with her PCP for reevaluation and also follow-up of her hypokalemia.  ____________________________________________   FINAL CLINICAL IMPRESSION(S) / ED DIAGNOSES  Final diagnoses:  Right-sided chest wall pain  Hypokalemia     ED Discharge Orders         Ordered    potassium chloride 20 MEQ TBCR  Daily        10/20/20 1505    oxyCODONE-acetaminophen (PERCOCET) 5-325 MG tablet  Every 6 hours PRN        10/20/20 1505    methocarbamol (ROBAXIN) 500 MG tablet  Every 6 hours PRN        10/20/20 1505          *Please note:  Felicia Woods was evaluated in Emergency Department on 10/20/2020 for the symptoms described in the history of present illness. She was evaluated in the context of the global COVID-19 pandemic, which necessitated consideration  that the patient might be at risk for infection with the SARS-CoV-2 virus that causes COVID-19. Institutional protocols and algorithms that pertain to the evaluation of patients at risk for COVID-19 are in a state of rapid change based on information released by regulatory bodies including the CDC and federal and state organizations. These policies and algorithms were followed during the patient's care in the ED.  Some ED evaluations and interventions may be delayed as a result of limited staffing during and the pandemic.*   Note:  This document was prepared using Dragon voice recognition software and may include unintentional dictation errors.    Johnn Hai, PA-C 10/20/20 1641    Vladimir Crofts, MD 10/20/20 [phone]

## 2020-10-20 NOTE — Discharge Instructions (Addendum)
Follow-up with your primary care provider if any continued problems.  Also make an appointment to follow-up on your hypokalemia that was found in the ED today.  A prescription for potassium was sent to the pharmacy for you to continue over the next 7 days.  A prescription for methocarbamol which is a muscle relaxant was sent to the pharmacy along with a prescription for pain medication if needed.  The pain medication should only be taken every 6 hours if needed for severe pain.  You may use heat to your rib area as needed for pain.

## 2020-10-29 DIAGNOSIS — I208 Other forms of angina pectoris: Secondary | ICD-10-CM | POA: Diagnosis present

## 2020-10-31 ENCOUNTER — Other Ambulatory Visit: Payer: Self-pay

## 2020-10-31 ENCOUNTER — Other Ambulatory Visit
Admission: RE | Admit: 2020-10-31 | Discharge: 2020-10-31 | Disposition: A | Discharge status: Home / Self Care | Payer: Medicare Other | Source: Ambulatory Visit | Attending: Internal Medicine | Admitting: Internal Medicine

## 2020-10-31 DIAGNOSIS — Z01812 Encounter for preprocedural laboratory examination: Secondary | ICD-10-CM | POA: Diagnosis present

## 2020-10-31 DIAGNOSIS — Z20822 Contact with and (suspected) exposure to covid-19: Secondary | ICD-10-CM | POA: Insufficient documentation

## 2020-11-01 LAB — SARS CORONAVIRUS 2 (TAT 6-24 HRS): SARS Coronavirus 2: NEGATIVE

## 2020-11-04 ENCOUNTER — Encounter
Admission: RE | Disposition: A | Discharge status: Home / Self Care | Payer: Self-pay | Source: Home / Self Care | Attending: Internal Medicine

## 2020-11-04 ENCOUNTER — Observation Stay
Admission: RE | Admit: 2020-11-04 | Discharge: 2020-11-05 | Disposition: A | Discharge status: Home / Self Care | Payer: Medicare Other | Attending: Internal Medicine | Admitting: Internal Medicine

## 2020-11-04 ENCOUNTER — Encounter: Payer: Self-pay | Admitting: Internal Medicine

## 2020-11-04 ENCOUNTER — Other Ambulatory Visit: Payer: Self-pay

## 2020-11-04 DIAGNOSIS — I25798 Atherosclerosis of other coronary artery bypass graft(s) with other forms of angina pectoris: Principal | ICD-10-CM | POA: Insufficient documentation

## 2020-11-04 DIAGNOSIS — I10 Essential (primary) hypertension: Secondary | ICD-10-CM | POA: Insufficient documentation

## 2020-11-04 DIAGNOSIS — Z79899 Other long term (current) drug therapy: Secondary | ICD-10-CM | POA: Insufficient documentation

## 2020-11-04 DIAGNOSIS — J45909 Unspecified asthma, uncomplicated: Secondary | ICD-10-CM | POA: Insufficient documentation

## 2020-11-04 DIAGNOSIS — I25709 Atherosclerosis of coronary artery bypass graft(s), unspecified, with unspecified angina pectoris: Secondary | ICD-10-CM | POA: Diagnosis present

## 2020-11-04 DIAGNOSIS — I25718 Atherosclerosis of autologous vein coronary artery bypass graft(s) with other forms of angina pectoris: Secondary | ICD-10-CM

## 2020-11-04 DIAGNOSIS — I208 Other forms of angina pectoris: Secondary | ICD-10-CM | POA: Diagnosis present

## 2020-11-04 DIAGNOSIS — Z7984 Long term (current) use of oral hypoglycemic drugs: Secondary | ICD-10-CM | POA: Insufficient documentation

## 2020-11-04 DIAGNOSIS — I25118 Atherosclerotic heart disease of native coronary artery with other forms of angina pectoris: Secondary | ICD-10-CM | POA: Diagnosis not present

## 2020-11-04 DIAGNOSIS — Z7982 Long term (current) use of aspirin: Secondary | ICD-10-CM | POA: Insufficient documentation

## 2020-11-04 DIAGNOSIS — Z955 Presence of coronary angioplasty implant and graft: Secondary | ICD-10-CM

## 2020-11-04 DIAGNOSIS — E119 Type 2 diabetes mellitus without complications: Secondary | ICD-10-CM | POA: Diagnosis not present

## 2020-11-04 HISTORY — PX: CORONARY STENT INTERVENTION: CATH118234

## 2020-11-04 HISTORY — PX: LEFT HEART CATH AND CORS/GRAFTS ANGIOGRAPHY: CATH118250

## 2020-11-04 LAB — BASIC METABOLIC PANEL
Anion gap: 8 (ref 5–15)
BUN: 12 mg/dL (ref 8–23)
CO2: 27 mmol/L (ref 22–32)
Calcium: 8.8 mg/dL — ABNORMAL LOW (ref 8.9–10.3)
Chloride: 104 mmol/L (ref 98–111)
Creatinine, Ser: 0.69 mg/dL (ref 0.44–1.00)
GFR, Estimated: >60 mL/min (ref >60)
Glucose, Bld: 144 mg/dL — ABNORMAL HIGH (ref 70–99)
Potassium: 3.4 mmol/L — ABNORMAL LOW (ref 3.5–5.1)
Sodium: 139 mmol/L (ref 135–145)

## 2020-11-04 LAB — CBC
HCT: 36.4 % (ref 36.0–46.0)
Hemoglobin: 11.7 g/dL — ABNORMAL LOW (ref 12.0–15.0)
MCH: 25.8 pg — ABNORMAL LOW (ref 26.0–34.0)
MCHC: 32.1 g/dL (ref 30.0–36.0)
MCV: 80.4 fL (ref 80.0–100.0)
Platelets: 248 10*3/uL (ref 150–400)
RBC: 4.53 MIL/uL (ref 3.87–5.11)
RDW: 17.2 % — ABNORMAL HIGH (ref 11.5–15.5)
WBC: 9.8 10*3/uL (ref 4.0–10.5)
nRBC: 0 % (ref 0.0–0.2)

## 2020-11-04 LAB — GLUCOSE, CAPILLARY
Glucose-Capillary: 126 mg/dL — ABNORMAL HIGH (ref 70–99)
Glucose-Capillary: 104 mg/dL — ABNORMAL HIGH (ref 70–99)
Glucose-Capillary: 143 mg/dL — ABNORMAL HIGH (ref 70–99)
Glucose-Capillary: 200 mg/dL — ABNORMAL HIGH (ref 70–99)

## 2020-11-04 LAB — POCT ACTIVATED CLOTTING TIME: Activated Clotting Time: 344 seconds

## 2020-11-04 SURGERY — LEFT HEART CATH AND CORS/GRAFTS ANGIOGRAPHY
Anesthesia: Moderate Sedation

## 2020-11-04 MED ORDER — NITROGLYCERIN 1 MG/10 ML FOR IR/CATH LAB
INTRA_ARTERIAL | Status: DC | PRN
  Administered 2020-11-04: 200 ug via INTRACORONARY

## 2020-11-04 MED ORDER — MIDAZOLAM HCL 2 MG/2ML IJ SOLN
INTRAMUSCULAR | Status: AC
  Filled 2020-11-04: qty 2

## 2020-11-04 MED ORDER — HEPARIN (PORCINE) IN NACL 1000-0.9 UT/500ML-% IV SOLN
INTRAVENOUS | Status: AC
  Filled 2020-11-04: qty 1000

## 2020-11-04 MED ORDER — ZOLPIDEM TARTRATE 5 MG PO TABS
10.0000 mg | ORAL_TABLET | Freq: Every day | ORAL | Status: DC
  Administered 2020-11-04: 10 mg via ORAL
  Filled 2020-11-04: qty 2

## 2020-11-04 MED ORDER — FENTANYL CITRATE (PF) 100 MCG/2ML IJ SOLN
INTRAMUSCULAR | Status: AC
  Filled 2020-11-04: qty 2

## 2020-11-04 MED ORDER — SODIUM CHLORIDE 0.9 % IV SOLN: 250.0000 mL | INTRAVENOUS | Status: DC | PRN

## 2020-11-04 MED ORDER — LIDOCAINE HCL (PF) 1 % IJ SOLN
INTRAMUSCULAR | Status: DC | PRN
  Administered 2020-11-04: 20 mL

## 2020-11-04 MED ORDER — NITROGLYCERIN 1 MG/10 ML FOR IR/CATH LAB
INTRA_ARTERIAL | Status: AC
  Filled 2020-11-04: qty 10

## 2020-11-04 MED ORDER — ACETAMINOPHEN 325 MG PO TABS
650.0000 mg | ORAL_TABLET | ORAL | Status: DC | PRN
  Administered 2020-11-04: 650 mg via ORAL
  Filled 2020-11-04: qty 2

## 2020-11-04 MED ORDER — ONDANSETRON HCL 4 MG/2ML IJ SOLN: 4.0000 mg | Freq: Four times a day (QID) | INTRAMUSCULAR | Status: DC | PRN

## 2020-11-04 MED ORDER — AMLODIPINE BESYLATE 5 MG PO TABS
10.0000 mg | ORAL_TABLET | Freq: Every day | ORAL | Status: DC
  Administered 2020-11-05: 10 mg via ORAL
  Filled 2020-11-04: qty 2

## 2020-11-04 MED ORDER — SODIUM CHLORIDE 0.9% FLUSH
3.0000 mL | Freq: Two times a day (BID) | INTRAVENOUS | Status: DC
  Administered 2020-11-05: 3 mL via INTRAVENOUS

## 2020-11-04 MED ORDER — ALUM & MAG HYDROXIDE-SIMETH 200-200-20 MG/5ML PO SUSP
ORAL | Status: DC | PRN
  Administered 2020-11-04: 30 mL via ORAL

## 2020-11-04 MED ORDER — MIDAZOLAM HCL 2 MG/2ML IJ SOLN
INTRAMUSCULAR | Status: DC | PRN
  Administered 2020-11-04: 1 mg via INTRAVENOUS

## 2020-11-04 MED ORDER — SODIUM CHLORIDE 0.9 % IV SOLN
INTRAVENOUS | Status: DC | PRN
  Administered 2020-11-04: 1.75 mg/kg/h via INTRAVENOUS

## 2020-11-04 MED ORDER — MONTELUKAST SODIUM 10 MG PO TABS
10.0000 mg | ORAL_TABLET | Freq: Every day | ORAL | Status: DC
  Administered 2020-11-04: 10 mg via ORAL
  Filled 2020-11-04: qty 1

## 2020-11-04 MED ORDER — ASPIRIN EC 81 MG PO TBEC
81.0000 mg | DELAYED_RELEASE_TABLET | Freq: Every day | ORAL | Status: DC
  Administered 2020-11-05: 81 mg via ORAL
  Filled 2020-11-04: qty 1

## 2020-11-04 MED ORDER — MIDAZOLAM HCL 2 MG/2ML IJ SOLN
INTRAMUSCULAR | Status: DC | PRN
  Administered 2020-11-04 (×2): 1 mg via INTRAVENOUS

## 2020-11-04 MED ORDER — INSULIN ASPART 100 UNIT/ML IJ SOLN
0.0000 [IU] | Freq: Three times a day (TID) | INTRAMUSCULAR | Status: DC
  Filled 2020-11-04: qty 1

## 2020-11-04 MED ORDER — VERAPAMIL HCL 2.5 MG/ML IV SOLN
INTRAVENOUS | Status: DC | PRN
  Administered 2020-11-04 (×2): 500 ug via INTRACORONARY

## 2020-11-04 MED ORDER — ASPIRIN 81 MG PO CHEW: 81.0000 mg | CHEWABLE_TABLET | ORAL | Status: DC

## 2020-11-04 MED ORDER — ISOSORBIDE MONONITRATE ER 60 MG PO TB24
60.0000 mg | ORAL_TABLET | Freq: Every day | ORAL | Status: DC
  Administered 2020-11-05: 60 mg via ORAL
  Filled 2020-11-04: qty 1

## 2020-11-04 MED ORDER — FENTANYL CITRATE (PF) 100 MCG/2ML IJ SOLN
INTRAMUSCULAR | Status: DC | PRN
  Administered 2020-11-04 (×2): 25 ug via INTRAVENOUS

## 2020-11-04 MED ORDER — BIVALIRUDIN TRIFLUOROACETATE 250 MG IV SOLR
INTRAVENOUS | Status: AC
  Filled 2020-11-04: qty 250

## 2020-11-04 MED ORDER — METOPROLOL SUCCINATE ER 25 MG PO TB24
25.0000 mg | ORAL_TABLET | Freq: Every day | ORAL | Status: DC
  Administered 2020-11-05: 25 mg via ORAL
  Filled 2020-11-04: qty 1

## 2020-11-04 MED ORDER — LOSARTAN POTASSIUM 50 MG PO TABS
100.0000 mg | ORAL_TABLET | Freq: Every day | ORAL | Status: DC
  Administered 2020-11-05: 100 mg via ORAL
  Filled 2020-11-04: qty 2

## 2020-11-04 MED ORDER — IOHEXOL 300 MG/ML  SOLN
INTRAMUSCULAR | Status: DC | PRN
  Administered 2020-11-04: 48 mL via INTRA_ARTERIAL

## 2020-11-04 MED ORDER — IOHEXOL 300 MG/ML  SOLN
INTRAMUSCULAR | Status: DC | PRN
  Administered 2020-11-04: 74 mL

## 2020-11-04 MED ORDER — RANOLAZINE ER 500 MG PO TB12
500.0000 mg | ORAL_TABLET | Freq: Two times a day (BID) | ORAL | Status: DC
  Administered 2020-11-04 – 2020-11-05 (×2): 500 mg via ORAL
  Filled 2020-11-04 (×4): qty 1

## 2020-11-04 MED ORDER — NITROGLYCERIN 0.4 MG SL SUBL
0.4000 mg | SUBLINGUAL_TABLET | SUBLINGUAL | Status: DC | PRN
Start: 2020-11-04 — End: 2020-11-05

## 2020-11-04 MED ORDER — SERTRALINE HCL 100 MG PO TABS
100.0000 mg | ORAL_TABLET | Freq: Every day | ORAL | Status: DC
  Administered 2020-11-05: 100 mg via ORAL
  Filled 2020-11-04: qty 1
  Filled 2020-11-04: qty 2

## 2020-11-04 MED ORDER — OXYBUTYNIN CHLORIDE ER 5 MG PO TB24
5.0000 mg | ORAL_TABLET | Freq: Every day | ORAL | Status: DC
  Administered 2020-11-04: 5 mg via ORAL
  Filled 2020-11-04 (×2): qty 1

## 2020-11-04 MED ORDER — CLOPIDOGREL BISULFATE 75 MG PO TABS
ORAL_TABLET | ORAL | Status: DC | PRN
  Administered 2020-11-04: 300 mg

## 2020-11-04 MED ORDER — HYDRALAZINE HCL 20 MG/ML IJ SOLN: 10.0000 mg | INTRAMUSCULAR | Status: AC | PRN

## 2020-11-04 MED ORDER — CLOPIDOGREL BISULFATE 75 MG PO TABS
75.0000 mg | ORAL_TABLET | Freq: Every day | ORAL | Status: DC
  Administered 2020-11-05: 75 mg via ORAL
  Filled 2020-11-04: qty 1

## 2020-11-04 MED ORDER — SODIUM CHLORIDE 0.9% FLUSH: 3.0000 mL | INTRAVENOUS | Status: DC | PRN

## 2020-11-04 MED ORDER — ENOXAPARIN SODIUM 40 MG/0.4ML IJ SOSY: 40.0000 mg | PREFILLED_SYRINGE | INTRAMUSCULAR | Status: DC

## 2020-11-04 MED ORDER — SODIUM CHLORIDE 0.9% FLUSH: 3.0000 mL | Freq: Two times a day (BID) | INTRAVENOUS | Status: DC

## 2020-11-04 MED ORDER — ALBUTEROL SULFATE HFA 108 (90 BASE) MCG/ACT IN AERS
2.0000 | INHALATION_SPRAY | Freq: Four times a day (QID) | RESPIRATORY_TRACT | Status: DC | PRN
  Filled 2020-11-04: qty 6.7

## 2020-11-04 MED ORDER — ENOXAPARIN SODIUM 60 MG/0.6ML IJ SOSY
0.5000 mg/kg | PREFILLED_SYRINGE | INTRAMUSCULAR | Status: DC
  Filled 2020-11-04: qty 0.55

## 2020-11-04 MED ORDER — LIDOCAINE HCL (PF) 1 % IJ SOLN
INTRAMUSCULAR | Status: AC
  Filled 2020-11-04: qty 30

## 2020-11-04 MED ORDER — HEPARIN (PORCINE) IN NACL 2000-0.9 UNIT/L-% IV SOLN
INTRAVENOUS | Status: DC | PRN
  Administered 2020-11-04: 1000 mL

## 2020-11-04 MED ORDER — SODIUM CHLORIDE 0.9 % WEIGHT BASED INFUSION
1.0000 mL/kg/h | INTRAVENOUS | Status: DC
  Administered 2020-11-04: 1 mL/kg/h via INTRAVENOUS

## 2020-11-04 MED ORDER — VERAPAMIL HCL 2.5 MG/ML IV SOLN
INTRAVENOUS | Status: AC
  Filled 2020-11-04: qty 2

## 2020-11-04 MED ORDER — LABETALOL HCL 5 MG/ML IV SOLN: 10.0000 mg | INTRAVENOUS | Status: AC | PRN

## 2020-11-04 MED ORDER — ALUM & MAG HYDROXIDE-SIMETH 200-200-20 MG/5ML PO SUSP
ORAL | Status: AC
  Filled 2020-11-04: qty 30

## 2020-11-04 MED ORDER — BIVALIRUDIN BOLUS VIA INFUSION - CUPID
INTRAVENOUS | Status: DC | PRN
  Administered 2020-11-04: 81.675 mg via INTRAVENOUS

## 2020-11-04 MED ORDER — CLOPIDOGREL BISULFATE 75 MG PO TABS
ORAL_TABLET | ORAL | Status: AC
  Filled 2020-11-04: qty 4

## 2020-11-04 MED ORDER — SODIUM CHLORIDE 0.9 % IV SOLN: INTRAVENOUS | Status: AC

## 2020-11-04 MED ORDER — INSULIN ASPART 100 UNIT/ML IJ SOLN: 0.0000 [IU] | Freq: Every day | INTRAMUSCULAR | Status: DC

## 2020-11-04 MED ORDER — SODIUM CHLORIDE 0.9 % WEIGHT BASED INFUSION
3.0000 mL/kg/h | INTRAVENOUS | Status: DC
  Administered 2020-11-04: 3 mL/kg/h via INTRAVENOUS

## 2020-11-04 SURGICAL SUPPLY — 18 items
BALLN ~~LOC~~ TREK RX 3.25X12 (BALLOONS) ×2
BALLOON ~~LOC~~ TREK RX 3.25X12 (BALLOONS) ×1 IMPLANT
CATH INFINITI 5FR JL4 (CATHETERS) ×2 IMPLANT
CATH INFINITI JR4 5F (CATHETERS) ×2 IMPLANT
CATH VISTA GUIDE 6FR LCB (CATHETERS) ×2 IMPLANT
DEVICE CLOSURE MYNXGRIP 6/7F (Vascular Products) ×2 IMPLANT
KIT ENCORE 26 ADVANTAGE (KITS) ×2 IMPLANT
NEEDLE PERC 18GX7CM (NEEDLE) ×2 IMPLANT
PACK CARDIAC CATH (CUSTOM PROCEDURE TRAY) ×2 IMPLANT
PROTECTION STATION PRESSURIZED (MISCELLANEOUS) ×2
SET ATX SIMPLICITY (MISCELLANEOUS) ×2 IMPLANT
SHEATH AVANTI 5FR X 11CM (SHEATH) ×2 IMPLANT
SHEATH AVANTI 6FR X 11CM (SHEATH) ×2 IMPLANT
STATION PROTECTION PRESSURIZED (MISCELLANEOUS) ×1 IMPLANT
STENT RESOLUTE ONYX 3.0X12 (Permanent Stent) ×2 IMPLANT
STENT RESOLUTE ONYX 3.0X15 (Permanent Stent) ×2 IMPLANT
WIRE GUIDERIGHT .035X150 (WIRE) ×2 IMPLANT
WIRE RUNTHROUGH .014X180CM (WIRE) ×2 IMPLANT

## 2020-11-04 NOTE — Brief Op Note (Signed)
BRIEF CARDIAC CATHETERIZATION NOTE  11/04/2020  2:25 PM  PATIENT:  Felicia Woods  65 y.o. female  PRE-OPERATIVE DIAGNOSIS:  Stable angina  POST-OPERATIVE DIAGNOSIS:  Same  PROCEDURE:  Procedure(s): LEFT HEART CATH AND CORS/GRAFTS ANGIOGRAPHY (N/A) CORONARY STENT INTERVENTION (N/A)  SURGEON:  Surgeon(s) and Role: Panel 1:    * Corey Skains, MD - Primary Panel 2:    * Nazareth Kirk, Harrell Gave, MD - Primary  FINDINGS: 1. Severe native and bypass graft disease, as detailed in Dr. Alveria Apley diagnostic catheterization report. 2. Successful PCI to ostium of SVG-OM ISR using Resolute Onyx 3.0 x 12 mm DES with 0% residual stenosis and TIMI-3 flow. 3. Successful PCI to mid SVG-OM using Resolute Onyx 3.0 x 15 mm DES with 0% residual stenosis and TIMI-3 flow.  RECOMMENDATIONS: 1. Continue indefinite DAPT with ASA and clopidogrel. 2. Aggressive secondary prevention. 3. Overnight extended recovery.  Nelva Bush, MD [facility]

## 2020-11-04 NOTE — Progress Notes (Signed)
PHARMACIST - PHYSICIAN COMMUNICATION  CONCERNING:  Enoxaparin (Lovenox) for DVT Prophylaxis    RECOMMENDATION: Patient was prescribed enoxaprin 40mg  q24 hours for VTE prophylaxis.   Filed Weights   11/04/20 0957  Weight: 108.9 kg (240 lb)    Body mass index is 37.59 kg/m.  Estimated Creatinine Clearance: 91.5 mL/min (by C-G formula based on SCr of 0.75 mg/dL).   Based on [place] patient is candidate for enoxaparin 0.5mg /kg TBW SQ every 24 hours based on BMI being >30.  DESCRIPTION: Pharmacy has adjusted enoxaparin dose per [facility] policy.  Patient is now receiving enoxaparin 55 mg every 24 hours    Berta Minor, PharmD Clinical Pharmacist  11/04/2020 2:45 PM

## 2020-11-05 ENCOUNTER — Encounter: Payer: Self-pay | Admitting: Internal Medicine

## 2020-11-05 DIAGNOSIS — I25798 Atherosclerosis of other coronary artery bypass graft(s) with other forms of angina pectoris: Secondary | ICD-10-CM | POA: Diagnosis not present

## 2020-11-05 LAB — GLUCOSE, CAPILLARY
Glucose-Capillary: 170 mg/dL — ABNORMAL HIGH (ref 70–99)
Glucose-Capillary: 200 mg/dL — ABNORMAL HIGH (ref 70–99)

## 2020-11-05 LAB — BASIC METABOLIC PANEL
Anion gap: 8 (ref 5–15)
BUN: 13 mg/dL (ref 8–23)
CO2: 25 mmol/L (ref 22–32)
Calcium: 8.7 mg/dL — ABNORMAL LOW (ref 8.9–10.3)
Chloride: 105 mmol/L (ref 98–111)
Creatinine, Ser: 0.75 mg/dL (ref 0.44–1.00)
GFR, Estimated: >60 mL/min (ref >60)
Glucose, Bld: 136 mg/dL — ABNORMAL HIGH (ref 70–99)
Potassium: 3.8 mmol/L (ref 3.5–5.1)
Sodium: 138 mmol/L (ref 135–145)

## 2020-11-05 LAB — CBC
HCT: 34.9 % — ABNORMAL LOW (ref 36.0–46.0)
Hemoglobin: 11.3 g/dL — ABNORMAL LOW (ref 12.0–15.0)
MCH: 25.6 pg — ABNORMAL LOW (ref 26.0–34.0)
MCHC: 32.4 g/dL (ref 30.0–36.0)
MCV: 79 fL — ABNORMAL LOW (ref 80.0–100.0)
Platelets: 245 10*3/uL (ref 150–400)
RBC: 4.42 MIL/uL (ref 3.87–5.11)
RDW: 16.7 % — ABNORMAL HIGH (ref 11.5–15.5)
WBC: 8.2 10*3/uL (ref 4.0–10.5)
nRBC: 0 % (ref 0.0–0.2)

## 2020-11-05 MED ORDER — ATORVASTATIN CALCIUM 80 MG PO TABS: 80.0000 mg | ORAL_TABLET | Freq: Every day | ORAL | 2 refills | Status: DC

## 2020-11-05 NOTE — Discharge Summary (Signed)
[facility] Cardiology Discharge Summary  Patient ID: Felicia Woods MRN: [id number] DOB/AGE: 1956-05-22 65 y.o.  Admit date: 11/04/2020 Discharge date: 11/05/2020  Primary Discharge Diagnosis: Coronary artery bypass graft with angina I25.798 Secondary Discharge Diagnosis diabetes, high blood pressure and high cholesterol  Significant Diagnostic Studies: Cardiac cath with left ventricular angiogram and selective coronary injection as well as PCI and stent placement of graft to OM2.  Hospital Course: The patient was admitted to specials for cardiac cath with selective coronary angiogram after full consent, risk and benefits explained, and time out called with all approprate details voiced and discussed. The patient has had progressive canadian class 4 angina with coronary artery risk factors including diabetes, high blood pressure and high cholesterol. The procedure was performed without complication and it revealed normal left ventricular function with ejection fraction of 55%.  It was found that the patient had severe 3 vessel coronary atherosclerosis with significant graft to OM2 stenosis requiring further intervention. Therefore, the patient had a PCI and drug eluding stent placed without complication. The patient has been ambulating without further significant symptoms and has reached his maximal hospital benefit and will be discharged to home in good condition.  Cardiac rehabilitation has been discussed and recommended. Medication management of cardiovascular risk factors will be given post discharge and modified as an outpatient.   Discharge Exam: Blood pressure 131/87, pulse 84, temperature 98.3 F (36.8 C), temperature source Oral, resp. rate 18, height 5\' 7"  (1.702 m), weight 108.8 kg, SpO2 97 %.  Constitutional: [place] oriented to person, place, and time. No distress.  HENT: No nasal discharge.  Head: Normocephalic and atraumatic.  Eyes: Pupils are equal and round. No discharge.   Neck: Normal range of motion. Neck supple. No JVD present. No thyromegaly present.  Cardiovascular: Normal rate, regular rhythm, normal S1 S2, no gallop, no friction rub. No murmur Pulmonary/Chest: Effort normal, No stridor. No respiratory distress. no wheezes.  no rales.    Abdominal: Soft. Bowel sounds are normal.  no distension.  no tenderness. There is no rebound and no guarding.  Musculoskeletal: No edema, no cyanosis, normal pulses, no bleeding, Normal range of motion. no tenderness.  Neurological:  alert and oriented to person, place, and time. Coordination normal.  Skin: Skin is warm and dry. No rash noted. No erythema. No pallor.  Psychiatric:  normal mood and affect. behavior is normal.    Labs:   Lab Results  Component Value Date   WBC 8.2 11/05/2020   HGB 11.3 (L) 11/05/2020   HCT 34.9 (L) 11/05/2020   MCV 79.0 (L) 11/05/2020   PLT 245 11/05/2020    Recent Labs  Lab 11/05/20 0529  NA 138  K 3.8  CL 105  CO2 25  BUN 13  CREATININE 0.75  CALCIUM 8.7*  GLUCOSE 136*    EKG: NSR without evidence of new changes  FOLLOW UP IN ONE TO TWO WEEKS Discharge Instructions    AMB Referral to Cardiac Rehabilitation - Phase II   Complete by: As directed    Diagnosis: Coronary Stents   After initial evaluation and assessments completed: Virtual Based Care may be provided alone or in conjunction with Phase 2 Cardiac Rehab based on patient barriers.: Yes     Allergies as of 11/05/2020      Reactions   Penicillin G Anaphylaxis, Other (See Comments)   Has patient had a PCN reaction causing immediate rash, facial/tongue/throat swelling, SOB or lightheadedness with hypotension: MWN:[id number]} Has patient had a PCN reaction causing  severe rash involving mucus membranes or skin necrosis: no:[id number]} Has patient had a PCN reaction that required hospitalization no:[id number]} Has patient had a PCN reaction occurring within the last 10 years: no:[id number]} If all of the above answers  are "NO", then may proceed with Cephalosporin use.      Medication List    STOP taking these medications   ranolazine 500 MG 12 hr tablet Commonly known as: RANEXA     TAKE these medications   Accu-Chek Guide test strip Generic drug: glucose blood Use as instructed to check blood sugars twice a day  E11.59   Accu-Chek Softclix Lancets lancets Use as instructed twice a day to check blood sugars  DIAG -E11.59   albuterol 108 (90 Base) MCG/ACT inhaler Commonly known as: VENTOLIN HFA Inhale 2 puffs into the lungs every 6 (six) hours as needed for wheezing or shortness of breath.   amLODipine 10 MG tablet Commonly known as: NORVASC Take 1 tablet (10 mg total) by mouth daily.   aspirin EC 81 MG tablet Take 81 mg by mouth daily.   bisoprolol-hydrochlorothiazide 5-6.25 MG tablet Commonly known as: ZIAC Take 1 tablet by mouth daily.   clopidogrel 75 MG tablet Commonly known as: PLAVIX Take 1 tablet (75 mg total) by mouth daily.   cyclobenzaprine 10 MG tablet Commonly known as: FLEXERIL Take 10 mg by mouth at bedtime.   dapagliflozin propanediol 10 MG Tabs tablet Commonly known as: FARXIGA Take 10 mg by mouth daily.   furosemide 40 MG tablet Commonly known as: LASIX Take 1 tablet (40 mg total) by mouth 2 (two) times daily.   isosorbide mononitrate 60 MG 24 hr tablet Commonly known as: IMDUR Take 60 mg by mouth daily.   losartan 100 MG tablet Commonly known as: COZAAR Take 1 tablet (100 mg total) by mouth daily. Take one tab po qd   methocarbamol 500 MG tablet Commonly known as: Robaxin Take 1 tablet (500 mg total) by mouth every 6 (six) hours as needed.   metoprolol succinate 25 MG 24 hr tablet Commonly known as: TOPROL-XL Take 25 mg by mouth daily.   montelukast 10 MG tablet Commonly known as: SINGULAIR Take 1 tablet (10 mg total) by mouth at bedtime.   nitroGLYCERIN 0.4 MG SL tablet Commonly known as: NITROSTAT Place 1 tablet (0.4 mg total) under the  tongue every 5 (five) minutes x 3 doses as needed for chest pain. *If no relief call MD or go to Emergency Room*   oxybutynin 5 MG 24 hr tablet Commonly known as: DITROPAN-XL Take 1 tablet (5 mg total) by mouth daily with supper.   oxyCODONE-acetaminophen 5-325 MG tablet Commonly known as: Percocet Take 1 tablet by mouth every 6 (six) hours as needed for severe pain.   Potassium Chloride ER 20 MEQ Tbcr Take 20 mEq by mouth daily for 5 days.   sertraline 100 MG tablet Commonly known as: ZOLOFT Take 1 tablet (100 mg total) by mouth daily.   Trulicity 1.5 JO/8.4ZY Sopn Generic drug: Dulaglutide INJECT 1.5MG  INTO THE SKIN ONCE A WEEK What changed: See the new instructions.   zolpidem 10 MG tablet Commonly known as: AMBIEN Take 1 tablet (10 mg total) by mouth at bedtime.        THE PATIENT  SHALL BRING ALL MEDICATIONS TO FOLLOW UP APPOINTMENT  Signed:  Corey Skains MD, [facility] 11/05/2020, 8:38 AM

## 2020-11-05 NOTE — Plan of Care (Signed)
Pt ready for discharge.  IV and tele removed Discharge instructions reviewed with patient. Time allowed for questions and concerns Verbalizes an understanding on discharge planning Awaiting patient husband to arrive for discharge home Problem: Education: Goal: Knowledge of General Education information will improve Description: Including pain rating scale, medication(s)/side effects and non-pharmacologic comfort measures Outcome: Adequate for Discharge   Problem: Health Behavior/Discharge Planning: Goal: Ability to manage health-related needs will improve Outcome: Adequate for Discharge   Problem: Clinical Measurements: Goal: Ability to maintain clinical measurements within normal limits will improve Outcome: Adequate for Discharge Goal: Will remain free from infection Outcome: Adequate for Discharge Goal: Diagnostic test results will improve Outcome: Adequate for Discharge Goal: Respiratory complications will improve Outcome: Adequate for Discharge Goal: Cardiovascular complication will be avoided Outcome: Adequate for Discharge   Problem: Activity: Goal: Risk for activity intolerance will decrease Outcome: Adequate for Discharge   Problem: Nutrition: Goal: Adequate nutrition will be maintained Outcome: Adequate for Discharge   Problem: Coping: Goal: Level of anxiety will decrease Outcome: Adequate for Discharge   Problem: Elimination: Goal: Will not experience complications related to bowel motility Outcome: Adequate for Discharge Goal: Will not experience complications related to urinary retention Outcome: Adequate for Discharge   Problem: Pain Managment: Goal: General experience of comfort will improve Outcome: Adequate for Discharge   Problem: Safety: Goal: Ability to remain free from injury will improve Outcome: Adequate for Discharge   Problem: Skin Integrity: Goal: Risk for impaired skin integrity will decrease Outcome: Adequate for Discharge

## 2020-11-17 ENCOUNTER — Ambulatory Visit (INDEPENDENT_AMBULATORY_CARE_PROVIDER_SITE_OTHER): Payer: Medicare Other | Admitting: Physician Assistant

## 2020-11-17 ENCOUNTER — Other Ambulatory Visit: Payer: Self-pay

## 2020-11-17 ENCOUNTER — Encounter: Payer: Self-pay | Admitting: Physician Assistant

## 2020-11-17 ENCOUNTER — Ambulatory Visit: Payer: Medicare Other | Admitting: Physician Assistant

## 2020-11-17 DIAGNOSIS — I10 Essential (primary) hypertension: Secondary | ICD-10-CM

## 2020-11-17 DIAGNOSIS — G4709 Other insomnia: Secondary | ICD-10-CM

## 2020-11-17 DIAGNOSIS — E1165 Type 2 diabetes mellitus with hyperglycemia: Secondary | ICD-10-CM | POA: Diagnosis not present

## 2020-11-17 DIAGNOSIS — K219 Gastro-esophageal reflux disease without esophagitis: Secondary | ICD-10-CM | POA: Diagnosis not present

## 2020-11-17 DIAGNOSIS — I2510 Atherosclerotic heart disease of native coronary artery without angina pectoris: Secondary | ICD-10-CM | POA: Diagnosis not present

## 2020-11-17 LAB — POCT GLYCOSYLATED HEMOGLOBIN (HGB A1C): Hemoglobin A1C: 7.5 % — AB (ref 4.0–5.6)

## 2020-11-17 MED ORDER — CANAGLIFLOZIN 300 MG PO TABS: 300.0000 mg | ORAL_TABLET | Freq: Every day | ORAL | 2 refills | Status: DC

## 2020-11-17 MED ORDER — FAMOTIDINE 20 MG PO TABS: 20.0000 mg | ORAL_TABLET | Freq: Two times a day (BID) | ORAL | 2 refills | Status: DC

## 2020-11-17 NOTE — Progress Notes (Signed)
[facility] [address], [place] [address]  Internal MEDICINE  Office Visit Note  Patient Name: Felicia Woods  [id number]  [id number]  Date of Service: 11/18/2020  Chief Complaint  Patient presents with  . Follow-up  . Diabetes  . Hyperlipidemia  . Hypertension  . Asthma    HPI Pt is here for routine follow up. -Fasting BG at home 102-103, taking farxiga and truclity however farxiga not covered and invokana was found to be the preferred and has been without farxiga for a few days. She is followed by podiatry who perform diabetic foot care. -Has CAD and had a stent placed a few weeks ago and follows up with cardiology in June. Everything is going well since this procedure.  -BP borderline low, pt denies feeling dizzy or lightheaded--advised to monitor closely and will discuss further with cardiology in a few weeks if still low. She is taking amlodipine, Ziac, losartan, metoprolol, isosorbide mononitrate and lasix  Current Medication: Outpatient Encounter Medications as of 11/17/2020  Medication Sig  . Accu-Chek Softclix Lancets lancets Use as instructed twice a day to check blood sugars  DIAG -E11.59  . albuterol (PROVENTIL HFA;VENTOLIN HFA) 108 (90 Base) MCG/ACT inhaler Inhale 2 puffs into the lungs every 6 (six) hours as needed for wheezing or shortness of breath.  Marland Kitchen amLODipine (NORVASC) 10 MG tablet Take 1 tablet (10 mg total) by mouth daily.  Marland Kitchen aspirin EC 81 MG tablet Take 81 mg by mouth daily.  Marland Kitchen atorvastatin (LIPITOR) 80 MG tablet Take 1 tablet (80 mg total) by mouth daily.  . bisoprolol-hydrochlorothiazide (ZIAC) 5-6.25 MG tablet Take 1 tablet by mouth daily.  . canagliflozin (INVOKANA) 300 MG TABS tablet Take 1 tablet (300 mg total) by mouth daily before breakfast.  . clopidogrel (PLAVIX) 75 MG tablet Take 1 tablet (75 mg total) by mouth daily.  . cyclobenzaprine (FLEXERIL) 10 MG tablet Take 10 mg by mouth at bedtime.  . famotidine (PEPCID) 20 MG  tablet Take 1 tablet (20 mg total) by mouth 2 (two) times daily.  . furosemide (LASIX) 40 MG tablet Take 1 tablet (40 mg total) by mouth 2 (two) times daily.  Marland Kitchen glucose blood (ACCU-CHEK GUIDE) test strip Use as instructed to check blood sugars twice a day  E11.59  . isosorbide mononitrate (IMDUR) 60 MG 24 hr tablet Take 60 mg by mouth daily.  Marland Kitchen losartan (COZAAR) 100 MG tablet Take 1 tablet (100 mg total) by mouth daily. Take one tab po qd  . metoprolol succinate (TOPROL-XL) 25 MG 24 hr tablet Take 25 mg by mouth daily.  . montelukast (SINGULAIR) 10 MG tablet Take 1 tablet (10 mg total) by mouth at bedtime.  . nitroGLYCERIN (NITROSTAT) 0.4 MG SL tablet Place 1 tablet (0.4 mg total) under the tongue every 5 (five) minutes x 3 doses as needed for chest pain. *If no relief call MD or go to Emergency Room*  . oxybutynin (DITROPAN-XL) 5 MG 24 hr tablet Take 1 tablet (5 mg total) by mouth daily with supper.  . TRULICITY 1.5 BP/1.0CH SOPN INJECT 1.5MG INTO THE SKIN ONCE A WEEK (Patient taking differently: Inject 1.5 mg into the skin every Friday.)  . zolpidem (AMBIEN) 10 MG tablet Take 1 tablet (10 mg total) by mouth at bedtime.  . [DISCONTINUED] sertraline (ZOLOFT) 100 MG tablet Take 1 tablet (100 mg total) by mouth daily.  . [DISCONTINUED] dapagliflozin propanediol (FARXIGA) 10 MG TABS tablet Take 10 mg by mouth daily. (Patient not taking: Reported  on 11/04/2020)  . [DISCONTINUED] methocarbamol (ROBAXIN) 500 MG tablet Take 1 tablet (500 mg total) by mouth every 6 (six) hours as needed. (Patient not taking: No sig reported)  . [DISCONTINUED] oxyCODONE-acetaminophen (PERCOCET) 5-325 MG tablet Take 1 tablet by mouth every 6 (six) hours as needed for severe pain. (Patient not taking: No sig reported)  . [DISCONTINUED] potassium chloride 20 MEQ TBCR Take 20 mEq by mouth daily for 5 days.   No facility-administered encounter medications on file as of 11/17/2020.    Surgical History: Past Surgical History:   Procedure Laterality Date  . ABDOMINAL HYSTERECTOMY    . CARDIAC CATHETERIZATION    . CARDIAC CATHETERIZATION N/A 10/18/2015   Procedure: Left Heart Cath and Cors/Grafts Angiography;  Surgeon: Lorretta Harp, MD;  Location: [place] [facility];  Service: Cardiovascular;  Laterality: N/A;  . CARDIAC CATHETERIZATION N/A 10/18/2015   Procedure: Coronary Stent Intervention;  Surgeon: Lorretta Harp, MD;  Location: [place] [facility];  Service: Cardiovascular;  Laterality: N/A;  . CARDIAC SURGERY    . CHOLECYSTECTOMY    . CORONARY ANGIOPLASTY    . CORONARY ARTERY BYPASS GRAFT     4 vessels - 2010  . CORONARY STENT INTERVENTION N/A 02/16/2017   Procedure: CORONARY STENT INTERVENTION;  Surgeon: Yolonda Kida, MD;  Location: [place] [facility];  Service: Cardiovascular;  Laterality: N/A;  . CORONARY STENT INTERVENTION N/A 02/13/2019   Procedure: CORONARY STENT INTERVENTION;  Surgeon: Nelva Bush, MD;  Location: [place] [facility];  Service: Cardiovascular;  Laterality: N/A;  SVG to RCA  . CORONARY STENT INTERVENTION Left 03/08/2019   Procedure: CORONARY STENT INTERVENTION;  Surgeon: Yolonda Kida, MD;  Location: [place] [facility];  Service: Cardiovascular;  Laterality: Left;  . CORONARY STENT INTERVENTION N/A 11/04/2020   Procedure: CORONARY STENT INTERVENTION;  Surgeon: Nelva Bush, MD;  Location: [place] [facility];  Service: Cardiovascular;  Laterality: N/A;  . LEFT HEART CATH AND CORONARY ANGIOGRAPHY Left 02/16/2017   Procedure: LEFT HEART CATH AND CORONARY ANGIOGRAPHY;  Surgeon: Corey Skains, MD;  Location: [place] [facility];  Service: Cardiovascular;  Laterality: Left;  . LEFT HEART CATH AND CORS/GRAFTS ANGIOGRAPHY Left 11/23/2017   Procedure: LEFT HEART CATH AND CORS/GRAFTS ANGIOGRAPHY;  Surgeon: Corey Skains, MD;  Location: [place] [facility];  Service: Cardiovascular;  Laterality: Left;  . LEFT HEART CATH AND CORS/GRAFTS ANGIOGRAPHY N/A 02/13/2019    Procedure: LEFT HEART CATH AND CORS/GRAFTS ANGIOGRAPHY;  Surgeon: Corey Skains, MD;  Location: [place] [facility];  Service: Cardiovascular;  Laterality: N/A;  . LEFT HEART CATH AND CORS/GRAFTS ANGIOGRAPHY N/A 07/03/2019   Procedure: LEFT HEART CATH AND CORS/GRAFTS ANGIOGRAPHY;  Surgeon: Corey Skains, MD;  Location: [place] [facility];  Service: Cardiovascular;  Laterality: N/A;  . LEFT HEART CATH AND CORS/GRAFTS ANGIOGRAPHY N/A 11/04/2020   Procedure: LEFT HEART CATH AND CORS/GRAFTS ANGIOGRAPHY;  Surgeon: Corey Skains, MD;  Location: [place] [facility];  Service: Cardiovascular;  Laterality: N/A;    Medical History: Past Medical History:  Diagnosis Date  . Asthma   . Coronary artery disease   . Diabetes mellitus without complication ([place])   . Heart attack ([place])   . Hyperlipidemia   . Hypertension   . MI (myocardial infarction) ([place])   . Migraine headache with aura   . 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    Family History: Family History  Problem Relation Age of Onset  . Diabetes Mother   .  Diabetes Father   . Cancer Father   . Diabetes Brother     Social History   Socioeconomic History  . Marital status: Single    Spouse name: james  . Number of children: Not on file  . Years of education: Not on file  . Highest education level: Not on file  Occupational History  . Not on file  Tobacco Use  . Smoking status: Never Smoker  . Smokeless tobacco: Never Used  Vaping Use  . Vaping Use: Never used  Substance and Sexual Activity  . Alcohol use: No    Alcohol/week: 0.0 standard drinks  . Drug use: No  . Sexual activity: Yes  Other Topics Concern  . Not on file  Social History Narrative  . Not on file   Social Determinants of Health   Financial Resource Strain: Not on file  Food Insecurity: Not on file  Transportation Needs: Not on file  Physical Activity: Not on file  Stress: Not on file  Social Connections: Not on file  Intimate Partner  Violence: Not on file      Review of Systems  Constitutional: Negative for chills, fatigue and unexpected weight change.  HENT: Negative for congestion, postnasal drip, rhinorrhea, sneezing and sore throat.   Eyes: Negative for redness.  Respiratory: Positive for cough. Negative for chest tightness and shortness of breath.   Cardiovascular: Negative for chest pain and palpitations.  Gastrointestinal: Negative for abdominal pain, constipation, diarrhea, nausea and vomiting.  Genitourinary: Negative for dysuria and frequency.  Musculoskeletal: Positive for back pain. Negative for arthralgias, joint swelling and neck pain.  Skin: Negative for rash.  Neurological: Negative.  Negative for tremors and numbness.  Hematological: Negative for adenopathy. Does not bruise/bleed easily.  Psychiatric/Behavioral: Positive for sleep disturbance. Negative for behavioral problems (Depression) and suicidal ideas. The patient is not nervous/anxious.     Vital Signs: BP 106/66   Pulse 65   Temp (!) 97.3 F (36.3 C)   Resp 16   Ht '5\' 7"'  (1.702 m)   Wt 249 lb 6.4 oz (113.1 kg)   SpO2 99%   BMI 39.06 kg/m    Physical Exam Vitals and nursing note reviewed.  Constitutional:      General: She is not in acute distress.    Appearance: She is well-developed. She is obese. She is not diaphoretic.  HENT:     Head: Normocephalic and atraumatic.     Mouth/Throat:     Pharynx: No oropharyngeal exudate.  Eyes:     Pupils: Pupils are equal, round, and reactive to light.  Neck:     Thyroid: No thyromegaly.     Vascular: No JVD.     Trachea: No tracheal deviation.  Cardiovascular:     Rate and Rhythm: Normal rate and regular rhythm.     Heart sounds: Normal heart sounds. No murmur heard. No friction rub. No gallop.   Pulmonary:     Effort: Pulmonary effort is normal. No respiratory distress.     Breath sounds: No wheezing or rales.  Chest:     Chest wall: No tenderness.  Abdominal:     General:  Bowel sounds are normal.     Palpations: Abdomen is soft.  Musculoskeletal:        General: Normal range of motion.     Cervical back: Normal range of motion and neck supple.  Lymphadenopathy:     Cervical: No cervical adenopathy.  Skin:    General: Skin is warm and dry.  Neurological:     Mental Status: She is alert and oriented to person, place, and time.     Cranial Nerves: No cranial nerve deficit.  Psychiatric:        Behavior: Behavior normal.        Thought Content: Thought content normal.        Judgment: Judgment normal.        Assessment/Plan: 1. Uncontrolled type 2 diabetes mellitus with hyperglycemia (HCC) - POCT HgB A1C is 7.5 which is slightly improved from 7.6 last visit. Wilder Glade no longer covered and been without it recently, will switch to invokana and continue trulicity weekly. Also advised to improve diet and exercise as able. - canagliflozin (INVOKANA) 300 MG TABS tablet; Take 1 tablet (300 mg total) by mouth daily before breakfast.  Dispense: 30 tablet; Refill: 2  2. Essential hypertension Borderline low in office, will monitor and pt will address further at f/u cardiology visit if still low for possible d/c of one of her medications  3. Gastroesophageal reflux disease without esophagitis Recent nighttime cough around 7pm, will try pepcid to see if attributable to reflux. - famotidine (PEPCID) 20 MG tablet; Take 1 tablet (20 mg total) by mouth 2 (two) times daily.  Dispense: 60 tablet; Refill: 2  4. Coronary artery disease involving native heart without angina pectoris, unspecified vessel or lesion type Followed by cardiology after recent stent placement, on plavix now  5. Other insomnia Continue ambien as needed for sleep   General Counseling: Chelsea verbalizes understanding of the findings of todays visit and agrees with plan of treatment. I have discussed any further diagnostic evaluation that may be needed or ordered today. We also reviewed her  medications today. she has been encouraged to call the office with any questions or concerns that should arise related to todays visit.    Orders Placed This Encounter  Procedures  . POCT HgB A1C    Meds ordered this encounter  Medications  . canagliflozin (INVOKANA) 300 MG TABS tablet    Sig: Take 1 tablet (300 mg total) by mouth daily before breakfast.    Dispense:  30 tablet    Refill:  2  . famotidine (PEPCID) 20 MG tablet    Sig: Take 1 tablet (20 mg total) by mouth 2 (two) times daily.    Dispense:  60 tablet    Refill:  2    This patient was seen by Drema Dallas, PA-C in collaboration with Dr. Clayborn Bigness as a part of collaborative care agreement.   Total time spent:30 Minutes Time spent includes review of chart, medications, test results, and follow up plan with the patient.      Dr Lavera Guise Internal medicine

## 2020-11-21 ENCOUNTER — Other Ambulatory Visit: Payer: Self-pay

## 2020-11-21 ENCOUNTER — Encounter: Payer: Medicare Other | Attending: Internal Medicine | Admitting: *Deleted

## 2020-11-21 DIAGNOSIS — Z955 Presence of coronary angioplasty implant and graft: Secondary | ICD-10-CM | POA: Insufficient documentation

## 2020-11-21 NOTE — Progress Notes (Signed)
Initial telephone orienation completed. Diagnosis can be found in [facility] 5/3. EP orientation scheduled for Thursday 5/26 at 3pm.

## 2020-11-27 ENCOUNTER — Other Ambulatory Visit: Payer: Self-pay

## 2020-11-27 VITALS — Ht 66.0 in | Wt 246.2 lb

## 2020-11-27 DIAGNOSIS — Z955 Presence of coronary angioplasty implant and graft: Secondary | ICD-10-CM

## 2020-11-27 NOTE — Patient Instructions (Signed)
Patient Instructions  Patient Details  Name: Felicia Woods MRN: [id number] Date of Birth: Jul 29, 1955 Referring Provider:  Corey Skains, MD  Below are your personal goals for exercise, nutrition, and risk factors. Our goal is to help you stay on track towards obtaining and maintaining these goals. We will be discussing your progress on these goals with you throughout the program.  Initial Exercise Prescription:  Initial Exercise Prescription - 11/27/20 1600      Date of Initial Exercise RX and Referring Provider   Date 11/27/20    Referring Provider Serafina Royals MD      Recumbant Bike   Level 1    RPM 60    Watts 15    Minutes 15    METs 1      NuStep   Level 1    SPM 80    Minutes 15    METs 1      Track   Laps 10    Minutes 15    METs 1      Prescription Details   Frequency (times per week) 3    Duration Progress to 30 minutes of continuous aerobic without signs/symptoms of physical distress      Intensity   THRR 40-80% of Max Heartrate 102-138    Ratings of Perceived Exertion 11-13    Perceived Dyspnea 0-4      Progression   Progression Continue to progress workloads to maintain intensity without signs/symptoms of physical distress.      Resistance Training   Training Prescription Yes    Weight 3 lb    Reps 10-15           Exercise Goals: Frequency: Be able to perform aerobic exercise two to three times per week in program working toward 2-5 days per week of home exercise.  Intensity: Work with a perceived exertion of 11 (fairly light) - 15 (hard) while following your exercise prescription.  We will make changes to your prescription with you as you progress through the program.   Duration: Be able to do 30 to 45 minutes of continuous aerobic exercise in addition to a 5 minute warm-up and a 5 minute cool-down routine.   Nutrition Goals: Your personal nutrition goals will be established when you do your nutrition analysis with the  dietician.  The following are general nutrition guidelines to follow: Cholesterol < 200mg /day Sodium < 1500mg /day Fiber: Women over 50 yrs - 21 grams per day  Personal Goals:  Personal Goals and Risk Factors at Admission - 11/27/20 1637      Core Components/Risk Factors/Patient Goals on Admission    Weight Management Yes;Obesity;Weight Loss    Intervention Weight Management: Develop a combined nutrition and exercise program designed to reach desired caloric intake, while maintaining appropriate intake of nutrient and fiber, sodium and fats, and appropriate energy expenditure required for the weight goal.;Weight Management: Provide education and appropriate resources to help participant work on and attain dietary goals.;Weight Management/Obesity: Establish reasonable short term and long term weight goals.;Obesity: Provide education and appropriate resources to help participant work on and attain dietary goals.    Admit Weight 246 lb (111.6 kg)    Goal Weight: Short Term 240 lb (108.9 kg)    Goal Weight: Long Term 200 lb (90.7 kg)    Expected Outcomes Short Term: Continue to assess and modify interventions until short term weight is achieved;Long Term: Adherence to nutrition and physical activity/exercise program aimed toward attainment of established weight goal;Weight Loss: Understanding of  general recommendations for a balanced deficit meal plan, which promotes 1-2 lb weight loss per week and includes a negative energy balance of [phone] kcal/d;Understanding recommendations for meals to include 15-35% energy as protein, 25-35% energy from fat, 35-60% energy from carbohydrates, less than 200mg  of dietary cholesterol, 20-35 gm of total fiber daily;Understanding of distribution of calorie intake throughout the day with the consumption of 4-5 meals/snacks    Diabetes Yes    Intervention Provide education about signs/symptoms and action to take for hypo/hyperglycemia.;Provide education about proper  nutrition, including hydration, and aerobic/resistive exercise prescription along with prescribed medications to achieve blood glucose in normal ranges: Fasting glucose 65-99 mg/dL    Expected Outcomes Short Term: Participant verbalizes understanding of the signs/symptoms and immediate care of hyper/hypoglycemia, proper foot care and importance of medication, aerobic/resistive exercise and nutrition plan for blood glucose control.;Long Term: Attainment of HbA1C < 7%.    Hypertension Yes    Intervention Provide education on lifestyle modifcations including regular physical activity/exercise, weight management, moderate sodium restriction and increased consumption of fresh fruit, vegetables, and low fat dairy, alcohol moderation, and smoking cessation.;Monitor prescription use compliance.    Expected Outcomes Short Term: Continued assessment and intervention until BP is < 140/33mm HG in hypertensive participants. < 130/60mm HG in hypertensive participants with diabetes, heart failure or chronic kidney disease.;Long Term: Maintenance of blood pressure at goal levels.    Lipids Yes    Intervention Provide education and support for participant on nutrition & aerobic/resistive exercise along with prescribed medications to achieve LDL 70mg , HDL >40mg .    Expected Outcomes Short Term: Participant states understanding of desired cholesterol values and is compliant with medications prescribed. Participant is following exercise prescription and nutrition guidelines.;Long Term: Cholesterol controlled with medications as prescribed, with individualized exercise RX and with personalized nutrition plan. Value goals: LDL < 70mg , HDL > 40 mg.           Tobacco Use Initial Evaluation: Social History   Tobacco Use  Smoking Status Never Smoker  Smokeless Tobacco Never Used    Exercise Goals and Review:  Exercise Goals    Row Name 11/27/20 1636             Exercise Goals   Increase Physical Activity Yes        Intervention Provide advice, education, support and counseling about physical activity/exercise needs.;Develop an individualized exercise prescription for aerobic and resistive training based on initial evaluation findings, risk stratification, comorbidities and participant's personal goals.       Expected Outcomes Short Term: Attend rehab on a regular basis to increase amount of physical activity.;Long Term: Add in home exercise to make exercise part of routine and to increase amount of physical activity.;Long Term: Exercising regularly at least 3-5 days a week.       Increase Strength and Stamina Yes       Intervention Provide advice, education, support and counseling about physical activity/exercise needs.;Develop an individualized exercise prescription for aerobic and resistive training based on initial evaluation findings, risk stratification, comorbidities and participant's personal goals.       Expected Outcomes Short Term: Increase workloads from initial exercise prescription for resistance, speed, and METs.;Short Term: Perform resistance training exercises routinely during rehab and add in resistance training at home;Long Term: Improve cardiorespiratory fitness, muscular endurance and strength as measured by increased METs and functional capacity (6MWT)       Able to understand and use rate of perceived exertion (RPE) scale Yes  Intervention Provide education and explanation on how to use RPE scale       Expected Outcomes Short Term: Able to use RPE daily in rehab to express subjective intensity level;Long Term:  Able to use RPE to guide intensity level when exercising independently       Able to understand and use Dyspnea scale Yes       Intervention Provide education and explanation on how to use Dyspnea scale       Expected Outcomes Short Term: Able to use Dyspnea scale daily in rehab to express subjective sense of shortness of breath during exertion;Long Term: Able to use Dyspnea  scale to guide intensity level when exercising independently       Knowledge and understanding of Target Heart Rate Range (THRR) Yes       Intervention Provide education and explanation of THRR including how the numbers were predicted and where they are located for reference       Expected Outcomes Short Term: Able to state/look up THRR;Short Term: Able to use daily as guideline for intensity in rehab;Long Term: Able to use THRR to govern intensity when exercising independently       Able to check pulse independently Yes       Intervention Provide education and demonstration on how to check pulse in carotid and radial arteries.;Review the importance of being able to check your own pulse for safety during independent exercise       Expected Outcomes Short Term: Able to explain why pulse checking is important during independent exercise;Long Term: Able to check pulse independently and accurately       Understanding of Exercise Prescription Yes       Intervention Provide education, explanation, and written materials on patient's individual exercise prescription       Expected Outcomes Short Term: Able to explain program exercise prescription;Long Term: Able to explain home exercise prescription to exercise independently              Copy of goals given to participant.

## 2020-11-27 NOTE — Progress Notes (Signed)
Cardiac Individual Treatment Plan  Patient Details  Name: Felicia Woods MRN: [id number] Date of Birth: [birth year] Referring Provider:   Flowsheet Row Cardiac Rehab from 11/27/2020 in [facility] Cardiac and Pulmonary Rehab  Referring Provider Serafina Royals MD      Initial Encounter Date:  Flowsheet Row Cardiac Rehab from 11/27/2020 in [facility] Cardiac and Pulmonary Rehab  Date 11/27/20      Visit Diagnosis: S/P coronary artery stent placement  Patient's Home Medications on Admission:  Current Outpatient Medications:  .  Accu-Chek Softclix Lancets lancets, Use as instructed twice a day to check blood sugars  DIAG -E11.59, Disp: 100 each, Rfl: 3 .  albuterol (PROVENTIL HFA;VENTOLIN HFA) 108 (90 Base) MCG/ACT inhaler, Inhale 2 puffs into the lungs every 6 (six) hours as needed for wheezing or shortness of breath., Disp: , Rfl:  .  amLODipine (NORVASC) 10 MG tablet, Take 1 tablet (10 mg total) by mouth daily., Disp: 90 tablet, Rfl: 1 .  aspirin EC 81 MG tablet, Take 81 mg by mouth daily., Disp: , Rfl:  .  atorvastatin (LIPITOR) 80 MG tablet, Take 1 tablet (80 mg total) by mouth daily., Disp: 30 tablet, Rfl: 2 .  bisoprolol-hydrochlorothiazide (ZIAC) 5-6.25 MG tablet, Take 1 tablet by mouth daily., Disp: 90 tablet, Rfl: 2 .  canagliflozin (INVOKANA) 300 MG TABS tablet, Take 1 tablet (300 mg total) by mouth daily before breakfast., Disp: 30 tablet, Rfl: 2 .  clopidogrel (PLAVIX) 75 MG tablet, Take 1 tablet (75 mg total) by mouth daily., Disp: 90 tablet, Rfl: 3 .  cyclobenzaprine (FLEXERIL) 10 MG tablet, Take 10 mg by mouth at bedtime., Disp: , Rfl:  .  famotidine (PEPCID) 20 MG tablet, Take 1 tablet (20 mg total) by mouth 2 (two) times daily., Disp: 60 tablet, Rfl: 2 .  furosemide (LASIX) 40 MG tablet, Take 1 tablet (40 mg total) by mouth 2 (two) times daily., Disp: 180 tablet, Rfl: 1 .  glucose blood (ACCU-CHEK GUIDE) test strip, Use as instructed to check blood sugars twice a day  E11.59, Disp:  100 each, Rfl: 3 .  isosorbide mononitrate (IMDUR) 60 MG 24 hr tablet, Take 60 mg by mouth daily., Disp: , Rfl:  .  losartan (COZAAR) 100 MG tablet, Take 1 tablet (100 mg total) by mouth daily. Take one tab po qd, Disp: 90 tablet, Rfl: 1 .  metoprolol succinate (TOPROL-XL) 25 MG 24 hr tablet, Take 25 mg by mouth daily., Disp: , Rfl:  .  montelukast (SINGULAIR) 10 MG tablet, Take 1 tablet (10 mg total) by mouth at bedtime., Disp: 90 tablet, Rfl: 3 .  nitroGLYCERIN (NITROSTAT) 0.4 MG SL tablet, Place 1 tablet (0.4 mg total) under the tongue every 5 (five) minutes x 3 doses as needed for chest pain. *If no relief call MD or go to Emergency Room*, Disp: 30 tablet, Rfl: 0 .  oxybutynin (DITROPAN-XL) 5 MG 24 hr tablet, Take 1 tablet (5 mg total) by mouth daily with supper., Disp: 90 tablet, Rfl: 2 .  TRULICITY 1.5 XA/1.2IN SOPN, INJECT 1.5MG INTO THE SKIN ONCE A WEEK (Patient taking differently: Inject 1.5 mg into the skin every Friday.), Disp: 12 mL, Rfl: 0 .  zolpidem (AMBIEN) 10 MG tablet, Take 1 tablet (10 mg total) by mouth at bedtime., Disp: 30 tablet, Rfl: 1  Past Medical History: Past Medical History:  Diagnosis Date  . Asthma   . Coronary artery disease   . Diabetes mellitus without complication ([place])   . Heart attack ([place])   .  Hyperlipidemia   . Hypertension   . MI (myocardial infarction) ([place])   . Migraine headache with aura   . 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    Tobacco Use: Social History   Tobacco Use  Smoking Status Never Smoker  Smokeless Tobacco Never Used    Labs: Recent Review Flowsheet Data    Labs for ITP Cardiac and Pulmonary Rehab Latest Ref Rng & Units 09/17/2019 11/13/2019 03/06/2020 08/20/2020 11/17/2020   Cholestrol 100 - 199 mg/dL 188 - - - -   LDLCALC 0 - 99 mg/dL 126(H) - - - -   HDL >39 mg/dL 38(L) - - - -   Trlycerides 0 - 149 mg/dL 133 - - - -   Hemoglobin A1c 4.0 - 5.6 % - 6.8(A) 6.9(A) 7.6(A) 7.5(A)   TCO2 0 - 100 mmol/L - - - - -       Exercise  Target Goals: Exercise Program Goal: Individual exercise prescription set using results from initial 6 min walk test and THRR while considering  patient's activity barriers and safety.   Exercise Prescription Goal: Initial exercise prescription builds to 30-45 minutes a day of aerobic activity, 2-3 days per week.  Home exercise guidelines will be given to patient during program as part of exercise prescription that the participant will acknowledge.   Education: Aerobic Exercise: - Group verbal and visual presentation on the components of exercise prescription. Introduces F.I.T.T principle from ACSM for exercise prescriptions.  Reviews F.I.T.T. principles of aerobic exercise including progression. Written material given at graduation.   Education: Resistance Exercise: - Group verbal and visual presentation on the components of exercise prescription. Introduces F.I.T.T principle from ACSM for exercise prescriptions  Reviews F.I.T.T. principles of resistance exercise including progression. Written material given at graduation.    Education: Exercise & Equipment Safety: - Individual verbal instruction and demonstration of equipment use and safety with use of the equipment. Flowsheet Row Cardiac Rehab from 11/27/2020 in [facility] Cardiac and Pulmonary Rehab  Date 11/27/20  Educator [place]  Instruction Review Code 1- Verbalizes Understanding      Education: Exercise Physiology & General Exercise Guidelines: - Group verbal and written instruction with models to review the exercise physiology of the cardiovascular system and associated critical values. Provides general exercise guidelines with specific guidelines to those with heart or lung disease.    Education: Flexibility, Balance, Mind/Body Relaxation: - Group verbal and visual presentation with interactive activity on the components of exercise prescription. Introduces F.I.T.T principle from ACSM for exercise prescriptions. Reviews F.I.T.T.  principles of flexibility and balance exercise training including progression. Also discusses the mind body connection.  Reviews various relaxation techniques to help reduce and manage stress (i.e. Deep breathing, progressive muscle relaxation, and visualization). Balance handout provided to take home. Written material given at graduation.   Activity Barriers & Risk Stratification:  Activity Barriers & Cardiac Risk Stratification - 11/27/20 1629      Activity Barriers & Cardiac Risk Stratification   Activity Barriers Back Problems;Joint Problems;Arthritis;Deconditioning;Muscular Weakness    Cardiac Risk Stratification High           6 Minute Walk:  6 Minute Walk    Row Name 11/27/20 1633         6 Minute Walk   Phase Initial     Distance 470 feet     Walk Time 6 minutes     # of Rest Breaks 0     MPH 0.89     METS 0.95  RPE 13     Perceived Dyspnea  0     VO2 Peak 3.35     Symptoms Yes (comment)     Comments Right knee- arthritis pain 10/10     Resting HR 66 bpm     Resting BP 104/62     Resting Oxygen Saturation  96 %     Exercise Oxygen Saturation  during 6 min walk 95 %     Max Ex. HR 89 bpm     Max Ex. BP 122/62     2 Minute Post BP 118/64            Oxygen Initial Assessment:   Oxygen Re-Evaluation:   Oxygen Discharge (Final Oxygen Re-Evaluation):   Initial Exercise Prescription:  Initial Exercise Prescription - 11/27/20 1600      Date of Initial Exercise RX and Referring Provider   Date 11/27/20    Referring Provider Serafina Royals MD      Recumbant Bike   Level 1    RPM 60    Watts 15    Minutes 15    METs 1      NuStep   Level 1    SPM 80    Minutes 15    METs 1      Track   Laps 10    Minutes 15    METs 1      Prescription Details   Frequency (times per week) 3    Duration Progress to 30 minutes of continuous aerobic without signs/symptoms of physical distress      Intensity   THRR 40-80% of Max Heartrate 102-138     Ratings of Perceived Exertion 11-13    Perceived Dyspnea 0-4      Progression   Progression Continue to progress workloads to maintain intensity without signs/symptoms of physical distress.      Resistance Training   Training Prescription Yes    Weight 3 lb    Reps 10-15           Perform Capillary Blood Glucose checks as needed.  Exercise Prescription Changes:   Exercise Prescription Changes    Row Name 11/27/20 1600             Response to Exercise   Blood Pressure (Admit) 104/62       Blood Pressure (Exercise) 122/62       Blood Pressure (Exit) 118/64       Heart Rate (Admit) 66 bpm       Heart Rate (Exercise) 89 bpm       Heart Rate (Exit) 66 bpm       Oxygen Saturation (Admit) 96 %       Oxygen Saturation (Exercise) 95 %       Oxygen Saturation (Exit) 96 %       Rating of Perceived Exertion (Exercise) 13       Perceived Dyspnea (Exercise) 0       Symptoms R knee pain from arthritis 10/10       Comments walk test results              Exercise Comments:   Exercise Goals and Review:   Exercise Goals    Row Name 11/27/20 1636             Exercise Goals   Increase Physical Activity Yes       Intervention Provide advice, education, support and counseling about physical activity/exercise needs.;Develop an individualized exercise prescription for aerobic  and resistive training based on initial evaluation findings, risk stratification, comorbidities and participant's personal goals.       Expected Outcomes Short Term: Attend rehab on a regular basis to increase amount of physical activity.;Long Term: Add in home exercise to make exercise part of routine and to increase amount of physical activity.;Long Term: Exercising regularly at least 3-5 days a week.       Increase Strength and Stamina Yes       Intervention Provide advice, education, support and counseling about physical activity/exercise needs.;Develop an individualized exercise prescription for aerobic  and resistive training based on initial evaluation findings, risk stratification, comorbidities and participant's personal goals.       Expected Outcomes Short Term: Increase workloads from initial exercise prescription for resistance, speed, and METs.;Short Term: Perform resistance training exercises routinely during rehab and add in resistance training at home;Long Term: Improve cardiorespiratory fitness, muscular endurance and strength as measured by increased METs and functional capacity (6MWT)       Able to understand and use rate of perceived exertion (RPE) scale Yes       Intervention Provide education and explanation on how to use RPE scale       Expected Outcomes Short Term: Able to use RPE daily in rehab to express subjective intensity level;Long Term:  Able to use RPE to guide intensity level when exercising independently       Able to understand and use Dyspnea scale Yes       Intervention Provide education and explanation on how to use Dyspnea scale       Expected Outcomes Short Term: Able to use Dyspnea scale daily in rehab to express subjective sense of shortness of breath during exertion;Long Term: Able to use Dyspnea scale to guide intensity level when exercising independently       Knowledge and understanding of Target Heart Rate Range (THRR) Yes       Intervention Provide education and explanation of THRR including how the numbers were predicted and where they are located for reference       Expected Outcomes Short Term: Able to state/look up THRR;Short Term: Able to use daily as guideline for intensity in rehab;Long Term: Able to use THRR to govern intensity when exercising independently       Able to check pulse independently Yes       Intervention Provide education and demonstration on how to check pulse in carotid and radial arteries.;Review the importance of being able to check your own pulse for safety during independent exercise       Expected Outcomes Short Term: Able to  explain why pulse checking is important during independent exercise;Long Term: Able to check pulse independently and accurately       Understanding of Exercise Prescription Yes       Intervention Provide education, explanation, and written materials on patient's individual exercise prescription       Expected Outcomes Short Term: Able to explain program exercise prescription;Long Term: Able to explain home exercise prescription to exercise independently              Exercise Goals Re-Evaluation :   Discharge Exercise Prescription (Final Exercise Prescription Changes):  Exercise Prescription Changes - 11/27/20 1600      Response to Exercise   Blood Pressure (Admit) 104/62    Blood Pressure (Exercise) 122/62    Blood Pressure (Exit) 118/64    Heart Rate (Admit) 66 bpm    Heart Rate (Exercise) 89 bpm  Heart Rate (Exit) 66 bpm    Oxygen Saturation (Admit) 96 %    Oxygen Saturation (Exercise) 95 %    Oxygen Saturation (Exit) 96 %    Rating of Perceived Exertion (Exercise) 13    Perceived Dyspnea (Exercise) 0    Symptoms R knee pain from arthritis 10/10    Comments walk test results           Nutrition:  Target Goals: Understanding of nutrition guidelines, daily intake of sodium <1541m, cholesterol <2071m calories 30% from fat and 7% or less from saturated fats, daily to have 5 or more servings of fruits and vegetables.  Education: All About Nutrition: -Group instruction provided by verbal, written material, interactive activities, discussions, models, and posters to present general guidelines for heart healthy nutrition including fat, fiber, MyPlate, the role of sodium in heart healthy nutrition, utilization of the nutrition label, and utilization of this knowledge for meal planning. Follow up email sent as well. Written material given at graduation. [facility]  Date 12/08/15  Educator CoJaclyn ShaggyInstruction  Review Code (retired) 2- meets goals/outcomes      Biometrics:  Pre Biometrics - 11/27/20 1629      Pre Biometrics   Height '5\' 6"'  (1.676 m)    Weight 246 lb 3.2 oz (111.7 kg)    BMI (Calculated) 39.76    Single Leg Stand 9.6 seconds            Nutrition Therapy Plan and Nutrition Goals:   Nutrition Assessments:  MEDIFICTS Score Key:  ?70 Need to make dietary changes   40-70 Heart Healthy Diet  ? 40 Therapeutic Level Cholesterol Diet   Picture Your Plate Scores:  <4<32nhealthy dietary pattern with much room for improvement.  41-50 Dietary pattern unlikely to meet recommendations for good health and room for improvement.  51-60 More healthful dietary pattern, with some room for improvement.   >60 Healthy dietary pattern, although there may be some specific behaviors that could be improved.    Nutrition Goals Re-Evaluation:   Nutrition Goals Discharge (Final Nutrition Goals Re-Evaluation):   Psychosocial: Target Goals: Acknowledge presence or absence of significant depression and/or stress, maximize coping skills, provide positive support system. Participant is able to verbalize types and ability to use techniques and skills needed for reducing stress and depression.   Education: Stress, Anxiety, and Depression - Group verbal and visual presentation to define topics covered.  Reviews how body is impacted by stress, anxiety, and depression.  Also discusses healthy ways to reduce stress and to treat/manage anxiety and depression.  Written material given at graduation. [facility]  Date 12/03/15  Educator K.Kathreen CornfieldMHHendricks Comm HospInstruction Review Code (retired) 2- meets goals/outcomes      Education: Sleep Hygiene -Provides group verbal and written instruction about how sleep can affect your health.  Define sleep hygiene, discuss sleep cycles and impact of sleep habits. Review good sleep hygiene tips.     Initial Review & Psychosocial Screening:  Initial Psych Review & Screening - 11/21/20 1430      Initial Review   Current issues with History of Depression;Current Stress Concerns      Family Dynamics   Good Support System? Yes      Barriers   Psychosocial barriers to participate in program The patient should benefit from training in stress management and relaxation.;There are no identifiable barriers or psychosocial  needs.      Screening Interventions   Interventions Encouraged to exercise;Provide feedback about the scores to participant;To provide support and resources with identified psychosocial needs    Expected Outcomes Short Term goal: Utilizing psychosocial counselor, staff and physician to assist with identification of specific Stressors or current issues interfering with healing process. Setting desired goal for each stressor or current issue identified.;Long Term Goal: Stressors or current issues are controlled or eliminated.;Short Term goal: Identification and review with participant of any Quality of Life or Depression concerns found by scoring the questionnaire.;Long Term goal: The participant improves quality of Life and PHQ9 Scores as seen by post scores and/or verbalization of changes           Quality of Life Scores:   Quality of Life - 11/27/20 1650      Quality of Life Scores   Health/Function Pre 22.65 %    Socioeconomic Pre 24.75 %    Psych/Spiritual Pre 28.29 %    Family Pre 28.8 %    GLOBAL Pre 25.36 %          Scores of 19 and below usually indicate a poorer quality of life in these areas.  A difference of  2-3 points is a clinically meaningful difference.  A difference of 2-3 points in the total score of the Quality of Life Index has been associated with significant improvement in overall quality of life, self-image, physical symptoms, and general health in studies assessing change in quality of life.  PHQ-9: Recent Review Flowsheet Data     Depression screen [facility] 2/9 11/27/2020 11/17/2020 08/20/2020 08/01/2020 04/24/2020   Decreased Interest 0 0 0 0 0   Down, Depressed, Hopeless 0 0 0 0 0   PHQ - 2 Score 0 0 0 0 0   Altered sleeping 0 - - - -   Tired, decreased energy 0 - - - -   Change in appetite 0 - - - -   Feeling bad or failure about yourself  0 - - - -   Trouble concentrating 0 - - - -   Moving slowly or fidgety/restless 0 - - - -   Suicidal thoughts 0 - - - -   PHQ-9 Score 0 - - - -   Difficult doing work/chores Not difficult at all - - - -     Interpretation of Total Score  Total Score Depression Severity:  1-4 = Minimal depression, 5-9 = Mild depression, 10-14 = Moderate depression, 15-19 = Moderately severe depression, 20-27 = Severe depression   Psychosocial Evaluation and Intervention:  Psychosocial Evaluation - 11/21/20 1438      Psychosocial Evaluation & Interventions   Comments Shyla is returning to Cardiac Rehab with another stent. She did some of the program in 2020. She states she is doing well and "doesn't let things stress me out." She reports to have a good support system, sleeping well, and is enjoying her life day to day. She is looking forward to coming to Cardiac Rehab to start back exercising and lose weight and hopes to get into a good routine.    Expected Outcomes Short: attend cardiac rehab for education and exercise. Long: develop and maintain positive self care habits.    Continue Psychosocial Services  Follow up required by staff           Psychosocial Re-Evaluation:   Psychosocial Discharge (Final Psychosocial Re-Evaluation):   Vocational Rehabilitation: Provide vocational rehab assistance to qualifying candidates.   Vocational Rehab Evaluation &  Intervention:  Vocational Rehab - 11/21/20 1430      Initial Vocational Rehab Evaluation & Intervention   Assessment shows need for Vocational Rehabilitation No           Education: Education Goals: Education classes will be  provided on a variety of topics geared toward better understanding of heart health and risk factor modification. Participant will state understanding/return demonstration of topics presented as noted by education test scores.  Learning Barriers/Preferences:  Learning Barriers/Preferences - 11/21/20 1430      Learning Barriers/Preferences   Learning Barriers Sight   glasses   Learning Preferences Individual Instruction;Verbal Instruction           General Cardiac Education Topics:  AED/CPR: - Group verbal and written instruction with the use of models to demonstrate the basic use of the AED with the basic ABC's of resuscitation.   Anatomy and Cardiac Procedures: - Group verbal and visual presentation and models provide information about basic cardiac anatomy and function. Reviews the testing methods done to diagnose heart disease and the outcomes of the test results. Describes the treatment choices: Medical Management, Angioplasty, or Coronary Bypass Surgery for treating various heart conditions including Myocardial Infarction, Angina, Valve Disease, and Cardiac Arrhythmias.  Written material given at graduation.   Medication Safety: - Group verbal and visual instruction to review commonly prescribed medications for heart and lung disease. Reviews the medication, class of the drug, and side effects. Includes the steps to properly store meds and maintain the prescription regimen.  Written material given at graduation.   Intimacy: - Group verbal instruction through game format to discuss how heart and lung disease can affect sexual intimacy. Written material given at graduation..   Know Your Numbers and Heart Failure: - Group verbal and visual instruction to discuss disease risk factors for cardiac and pulmonary disease and treatment options.  Reviews associated critical values for Overweight/Obesity, Hypertension, Cholesterol, and Diabetes.  Discusses basics of heart failure:  signs/symptoms and treatments.  Introduces Heart Failure Zone chart for action plan for heart failure.  Written material given at graduation.   Infection Prevention: - Provides verbal and written material to individual with discussion of infection control including proper hand washing and proper equipment cleaning during exercise session. Flowsheet Row Cardiac Rehab from 11/27/2020 in [facility] Cardiac and Pulmonary Rehab  Date 11/27/20  Educator [place]  Instruction Review Code 1- Verbalizes Understanding      Falls Prevention: - Provides verbal and written material to individual with discussion of falls prevention and safety. Flowsheet Row Cardiac Rehab from 11/27/2020 in [facility] Cardiac and Pulmonary Rehab  Date 11/27/20  Educator [place]  Instruction Review Code 1- Verbalizes Understanding      Other: -Provides group and verbal instruction on various topics (see comments)   Knowledge Questionnaire Score:  Knowledge Questionnaire Score - 11/27/20 1639      Knowledge Questionnaire Score   Pre Score 18/26: Heart attack, HF, Nutrition, Stress, Exercise, Nutrition           Core Components/Risk Factors/Patient Goals at Admission:  Personal Goals and Risk Factors at Admission - 11/27/20 1637      Core Components/Risk Factors/Patient Goals on Admission    Weight Management Yes;Obesity;Weight Loss    Intervention Weight Management: Develop a combined nutrition and exercise program designed to reach desired caloric intake, while maintaining appropriate intake of nutrient and fiber, sodium and fats, and appropriate energy expenditure required for the weight goal.;Weight Management: Provide education and appropriate resources to help participant work on  and attain dietary goals.;Weight Management/Obesity: Establish reasonable short term and long term weight goals.;Obesity: Provide education and appropriate resources to help participant work on and attain dietary goals.    Admit Weight 246 lb (111.6 kg)     Goal Weight: Short Term 240 lb (108.9 kg)    Goal Weight: Long Term 200 lb (90.7 kg)    Expected Outcomes Short Term: Continue to assess and modify interventions until short term weight is achieved;Long Term: Adherence to nutrition and physical activity/exercise program aimed toward attainment of established weight goal;Weight Loss: Understanding of general recommendations for a balanced deficit meal plan, which promotes 1-2 lb weight loss per week and includes a negative energy balance of [phone] kcal/d;Understanding recommendations for meals to include 15-35% energy as protein, 25-35% energy from fat, 35-60% energy from carbohydrates, less than 264m of dietary cholesterol, 20-35 gm of total fiber daily;Understanding of distribution of calorie intake throughout the day with the consumption of 4-5 meals/snacks    Diabetes Yes    Intervention Provide education about signs/symptoms and action to take for hypo/hyperglycemia.;Provide education about proper nutrition, including hydration, and aerobic/resistive exercise prescription along with prescribed medications to achieve blood glucose in normal ranges: Fasting glucose 65-99 mg/dL    Expected Outcomes Short Term: Participant verbalizes understanding of the signs/symptoms and immediate care of hyper/hypoglycemia, proper foot care and importance of medication, aerobic/resistive exercise and nutrition plan for blood glucose control.;Long Term: Attainment of HbA1C < 7%.    Hypertension Yes    Intervention Provide education on lifestyle modifcations including regular physical activity/exercise, weight management, moderate sodium restriction and increased consumption of fresh fruit, vegetables, and low fat dairy, alcohol moderation, and smoking cessation.;Monitor prescription use compliance.    Expected Outcomes Short Term: Continued assessment and intervention until BP is < 140/958mHG in hypertensive participants. < 130/8057mG in hypertensive participants  with diabetes, heart failure or chronic kidney disease.;Long Term: Maintenance of blood pressure at goal levels.    Lipids Yes    Intervention Provide education and support for participant on nutrition & aerobic/resistive exercise along with prescribed medications to achieve LDL <6m96mDL >40mg3m Expected Outcomes Short Term: Participant states understanding of desired cholesterol values and is compliant with medications prescribed. Participant is following exercise prescription and nutrition guidelines.;Long Term: Cholesterol controlled with medications as prescribed, with individualized exercise RX and with personalized nutrition plan. Value goals: LDL < 6mg,4m > 40 mg.           Education:Diabetes - Individual verbal and written instruction to review signs/symptoms of diabetes, desired ranges of glucose level fasting, after meals and with exercise. Acknowledge that pre and post exercise glucose checks will be done for 3 sessions at entry of program. [place] [facility]  Education need identified 11/27/20  Date 11/27/20  Educator KL  [place] Review Code 1- Verbalizes Understanding      Core Components/Risk Factors/Patient Goals Review:    Core Components/Risk Factors/Patient Goals at Discharge (Final Review):    ITP Comments:  ITP Comments    [place]05/20/22 1432 11/27/20 1704         ITP Comments Initial telephone orienation completed. Diagnosis can be found in [facility] 5/3. EP orientation scheduled for Thursday 5/26 at 3pm. Completed 6MWT and gym orientation. Initial ITP created and sent for review to Dr. Mark MEmily Filbertcal Director.             Comments: Initial ITP

## 2020-12-03 ENCOUNTER — Other Ambulatory Visit: Payer: Self-pay

## 2020-12-03 ENCOUNTER — Encounter: Payer: Medicare Other | Attending: Internal Medicine | Admitting: *Deleted

## 2020-12-03 DIAGNOSIS — Z48812 Encounter for surgical aftercare following surgery on the circulatory system: Secondary | ICD-10-CM | POA: Diagnosis not present

## 2020-12-03 DIAGNOSIS — Z955 Presence of coronary angioplasty implant and graft: Secondary | ICD-10-CM | POA: Diagnosis present

## 2020-12-03 LAB — GLUCOSE, CAPILLARY
Glucose-Capillary: 217 mg/dL — ABNORMAL HIGH (ref 70–99)
Glucose-Capillary: 160 mg/dL — ABNORMAL HIGH (ref 70–99)

## 2020-12-03 NOTE — Progress Notes (Signed)
Daily Session Note  Patient Details  Name: Felicia Woods MRN: [id number] Date of Birth: May 22, 1956 Referring Provider:   Flowsheet Row Cardiac Rehab from 11/27/2020 in [facility] Cardiac and Pulmonary Rehab  Referring Provider Serafina Royals MD      Encounter Date: 12/03/2020  Check In:  Session Check In - 12/03/20 1545      Check-In   Supervising physician immediately available to respond to emergencies See telemetry face sheet for immediately available ER MD    Location [facility]-Cardiac & Pulmonary Rehab    Staff Present Renita Papa, RN BSN;Melissa Caiola RDN, Tawanna Solo, MS, ASCM CEP, Exercise Physiologist    Virtual Visit No    Medication changes reported     No    Fall or balance concerns reported    No    Warm-up and Cool-down Performed on first and last piece of equipment    Resistance Training Performed Yes    VAD Patient? No    PAD/SET Patient? No      Pain Assessment   Currently in Pain? No/denies              Social History   Tobacco Use  Smoking Status Never Smoker  Smokeless Tobacco Never Used    Goals Met:  Independence with exercise equipment Exercise tolerated well No report of cardiac concerns or symptoms Strength training completed today  Goals Unmet:  Not Applicable  Comments: First full day of exercise!  Patient was oriented to gym and equipment including functions, settings, policies, and procedures.  Patient's individual exercise prescription and treatment plan were reviewed.  All starting workloads were established based on the results of the 6 minute walk test done at initial orientation visit.  The plan for exercise progression was also introduced and progression will be customized based on patient's performance and goals.    Dr. Emily Filbert is Medical Director for [facility].  Dr. Ottie Glazier is Medical Director for [facility] Pulmonary Rehabilitation.

## 2020-12-05 ENCOUNTER — Other Ambulatory Visit: Payer: Self-pay | Admitting: Nurse Practitioner

## 2020-12-05 ENCOUNTER — Other Ambulatory Visit: Payer: Self-pay | Admitting: Adult Health

## 2020-12-05 DIAGNOSIS — E1165 Type 2 diabetes mellitus with hyperglycemia: Secondary | ICD-10-CM

## 2020-12-08 ENCOUNTER — Other Ambulatory Visit: Payer: Self-pay

## 2020-12-08 DIAGNOSIS — Z48812 Encounter for surgical aftercare following surgery on the circulatory system: Secondary | ICD-10-CM | POA: Diagnosis not present

## 2020-12-08 DIAGNOSIS — Z955 Presence of coronary angioplasty implant and graft: Secondary | ICD-10-CM

## 2020-12-08 LAB — GLUCOSE, CAPILLARY: Glucose-Capillary: 170 mg/dL — ABNORMAL HIGH (ref 70–99)

## 2020-12-08 NOTE — Progress Notes (Signed)
Incomplete Session Note  Patient Details  Name: Felicia Woods MRN: [id number] Date of Birth: 05/16/1956 Referring Provider:   Flowsheet Row Cardiac Rehab from 11/27/2020 in [facility] Cardiac and Pulmonary Rehab  Referring Provider Serafina Royals MD      [name] did not complete her rehab session.  She was having chronic knee pain when arriving and attempted to exercise on the arm crank. However, her knee pain continues and she is unable to stay to finish exercising today.

## 2020-12-10 ENCOUNTER — Other Ambulatory Visit: Payer: Self-pay | Admitting: Adult Health

## 2020-12-10 DIAGNOSIS — E1165 Type 2 diabetes mellitus with hyperglycemia: Secondary | ICD-10-CM

## 2020-12-16 ENCOUNTER — Other Ambulatory Visit: Payer: Self-pay

## 2020-12-16 DIAGNOSIS — E1165 Type 2 diabetes mellitus with hyperglycemia: Secondary | ICD-10-CM

## 2020-12-16 MED ORDER — CYCLOBENZAPRINE HCL 10 MG PO TABS: 10.0000 mg | ORAL_TABLET | Freq: Every day | ORAL | 0 refills | Status: DC

## 2020-12-16 MED ORDER — TRULICITY 1.5 MG/0.5ML ~~LOC~~ SOAJ: SUBCUTANEOUS | 1 refills | Status: DC

## 2020-12-17 ENCOUNTER — Encounter: Payer: Self-pay | Admitting: *Deleted

## 2020-12-17 DIAGNOSIS — Z955 Presence of coronary angioplasty implant and graft: Secondary | ICD-10-CM

## 2020-12-17 NOTE — Progress Notes (Signed)
Cardiac Individual Treatment Plan  Patient Details  Name: Felicia Woods MRN: [id number] Date of Birth: [birth year] Referring Provider:   Flowsheet Row Cardiac Rehab from 11/27/2020 in [facility] Cardiac and Pulmonary Rehab  Referring Provider Serafina Royals MD       Initial Encounter Date:  Flowsheet Row Cardiac Rehab from 11/27/2020 in [facility] Cardiac and Pulmonary Rehab  Date 11/27/20       Visit Diagnosis: S/P coronary artery stent placement  Patient's Home Medications on Admission:  Current Outpatient Medications:    Accu-Chek Softclix Lancets lancets, Use as instructed twice a day to check blood sugars  DIAG -E11.59, Disp: 100 each, Rfl: 3   albuterol (PROVENTIL HFA;VENTOLIN HFA) 108 (90 Base) MCG/ACT inhaler, Inhale 2 puffs into the lungs every 6 (six) hours as needed for wheezing or shortness of breath., Disp: , Rfl:    amLODipine (NORVASC) 10 MG tablet, Take 1 tablet (10 mg total) by mouth daily., Disp: 90 tablet, Rfl: 1   aspirin EC 81 MG tablet, Take 81 mg by mouth daily., Disp: , Rfl:    atorvastatin (LIPITOR) 80 MG tablet, Take 1 tablet (80 mg total) by mouth daily., Disp: 30 tablet, Rfl: 2   bisoprolol-hydrochlorothiazide (ZIAC) 5-6.25 MG tablet, Take 1 tablet by mouth daily., Disp: 90 tablet, Rfl: 2   canagliflozin (INVOKANA) 300 MG TABS tablet, Take 1 tablet (300 mg total) by mouth daily before breakfast., Disp: 30 tablet, Rfl: 2   clopidogrel (PLAVIX) 75 MG tablet, Take 1 tablet (75 mg total) by mouth daily., Disp: 90 tablet, Rfl: 3   cyclobenzaprine (FLEXERIL) 10 MG tablet, Take 1 tablet (10 mg total) by mouth at bedtime., Disp: 30 tablet, Rfl: 0   Dulaglutide (TRULICITY) 1.5 QI/2.9NL SOPN, INJECT 1.5MG INTO THE SKIN ONCE A WEEK, Disp: 12 mL, Rfl: 1   famotidine (PEPCID) 20 MG tablet, Take 1 tablet (20 mg total) by mouth 2 (two) times daily., Disp: 60 tablet, Rfl: 2   furosemide (LASIX) 40 MG tablet, Take 1 tablet (40 mg total) by mouth 2 (two) times daily., Disp: 180  tablet, Rfl: 1   glucose blood (ACCU-CHEK GUIDE) test strip, Use as instructed to check blood sugars twice a day  E11.59, Disp: 100 each, Rfl: 3   isosorbide mononitrate (IMDUR) 60 MG 24 hr tablet, Take 60 mg by mouth daily., Disp: , Rfl:    losartan (COZAAR) 100 MG tablet, Take 1 tablet (100 mg total) by mouth daily. Take one tab po qd, Disp: 90 tablet, Rfl: 1   metoprolol succinate (TOPROL-XL) 25 MG 24 hr tablet, Take 25 mg by mouth daily., Disp: , Rfl:    montelukast (SINGULAIR) 10 MG tablet, Take 1 tablet (10 mg total) by mouth at bedtime., Disp: 90 tablet, Rfl: 3   nitroGLYCERIN (NITROSTAT) 0.4 MG SL tablet, Place 1 tablet (0.4 mg total) under the tongue every 5 (five) minutes x 3 doses as needed for chest pain. *If no relief call MD or go to Emergency Room*, Disp: 30 tablet, Rfl: 0   oxybutynin (DITROPAN-XL) 5 MG 24 hr tablet, Take 1 tablet (5 mg total) by mouth daily with supper., Disp: 90 tablet, Rfl: 2   zolpidem (AMBIEN) 10 MG tablet, Take 1 tablet (10 mg total) by mouth at bedtime., Disp: 30 tablet, Rfl: 1  Past Medical History: Past Medical History:  Diagnosis Date   Asthma    Coronary artery disease    Diabetes mellitus without complication ([place])    Heart attack ([place])    Hyperlipidemia  Hypertension    MI (myocardial infarction) ([place])    Migraine headache with aura    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    Tobacco Use: Social History   Tobacco Use  Smoking Status Never  Smokeless Tobacco Never    Labs: Recent Review Flowsheet Data     Labs for ITP Cardiac and Pulmonary Rehab Latest Ref Rng & Units 09/17/2019 11/13/2019 03/06/2020 08/20/2020 11/17/2020   Cholestrol 100 - 199 mg/dL 188 - - - -   LDLCALC 0 - 99 mg/dL 126(H) - - - -   HDL >39 mg/dL 38(L) - - - -   Trlycerides 0 - 149 mg/dL 133 - - - -   Hemoglobin A1c 4.0 - 5.6 % - 6.8(A) 6.9(A) 7.6(A) 7.5(A)   TCO2 0 - 100 mmol/L - - - - -        Exercise Target Goals: Exercise Program Goal: Individual exercise  prescription set using results from initial 6 min walk test and THRR while considering  patient's activity barriers and safety.   Exercise Prescription Goal: Initial exercise prescription builds to 30-45 minutes a day of aerobic activity, 2-3 days per week.  Home exercise guidelines will be given to patient during program as part of exercise prescription that the participant will acknowledge.   Education: Aerobic Exercise: - Group verbal and visual presentation on the components of exercise prescription. Introduces F.I.T.T principle from ACSM for exercise prescriptions.  Reviews F.I.T.T. principles of aerobic exercise including progression. Written material given at graduation.   Education: Resistance Exercise: - Group verbal and visual presentation on the components of exercise prescription. Introduces F.I.T.T principle from ACSM for exercise prescriptions  Reviews F.I.T.T. principles of resistance exercise including progression. Written material given at graduation.    Education: Exercise & Equipment Safety: - Individual verbal instruction and demonstration of equipment use and safety with use of the equipment. Flowsheet Row Cardiac Rehab from 11/27/2020 in [facility] Cardiac and Pulmonary Rehab  Date 11/27/20  Educator [place]  Instruction Review Code 1- Verbalizes Understanding       Education: Exercise Physiology & General Exercise Guidelines: - Group verbal and written instruction with models to review the exercise physiology of the cardiovascular system and associated critical values. Provides general exercise guidelines with specific guidelines to those with heart or lung disease.    Education: Flexibility, Balance, Mind/Body Relaxation: - Group verbal and visual presentation with interactive activity on the components of exercise prescription. Introduces F.I.T.T principle from ACSM for exercise prescriptions. Reviews F.I.T.T. principles of flexibility and balance exercise training including  progression. Also discusses the mind body connection.  Reviews various relaxation techniques to help reduce and manage stress (i.e. Deep breathing, progressive muscle relaxation, and visualization). Balance handout provided to take home. Written material given at graduation.   Activity Barriers & Risk Stratification:  Activity Barriers & Cardiac Risk Stratification - 11/27/20 1629       Activity Barriers & Cardiac Risk Stratification   Activity Barriers Back Problems;Joint Problems;Arthritis;Deconditioning;Muscular Weakness    Cardiac Risk Stratification High             6 Minute Walk:  6 Minute Walk     Row Name 11/27/20 1633         6 Minute Walk   Phase Initial     Distance 470 feet     Walk Time 6 minutes     # of Rest Breaks 0     MPH 0.89     METS 0.95  RPE 13     Perceived Dyspnea  0     VO2 Peak 3.35     Symptoms Yes (comment)     Comments Right knee- arthritis pain 10/10     Resting HR 66 bpm     Resting BP 104/62     Resting Oxygen Saturation  96 %     Exercise Oxygen Saturation  during 6 min walk 95 %     Max Ex. HR 89 bpm     Max Ex. BP 122/62     2 Minute Post BP 118/64              Oxygen Initial Assessment:   Oxygen Re-Evaluation:   Oxygen Discharge (Final Oxygen Re-Evaluation):   Initial Exercise Prescription:  Initial Exercise Prescription - 11/27/20 1600       Date of Initial Exercise RX and Referring Provider   Date 11/27/20    Referring Provider Serafina Royals MD      Recumbant Bike   Level 1    RPM 60    Watts 15    Minutes 15    METs 1      NuStep   Level 1    SPM 80    Minutes 15    METs 1      Track   Laps 10    Minutes 15    METs 1      Prescription Details   Frequency (times per week) 3    Duration Progress to 30 minutes of continuous aerobic without signs/symptoms of physical distress      Intensity   THRR 40-80% of Max Heartrate 102-138    Ratings of Perceived Exertion 11-13    Perceived  Dyspnea 0-4      Progression   Progression Continue to progress workloads to maintain intensity without signs/symptoms of physical distress.      Resistance Training   Training Prescription Yes    Weight 3 lb    Reps 10-15             Perform Capillary Blood Glucose checks as needed.  Exercise Prescription Changes:   Exercise Prescription Changes     Row Name 11/27/20 1600 12/10/20 1300           Response to Exercise   Blood Pressure (Admit) 104/62 112/66      Blood Pressure (Exercise) 122/62 140/70      Blood Pressure (Exit) 118/64 110/62      Heart Rate (Admit) 66 bpm 59 bpm      Heart Rate (Exercise) 89 bpm 103 bpm      Heart Rate (Exit) 66 bpm 74 bpm      Oxygen Saturation (Admit) 96 % --      Oxygen Saturation (Exercise) 95 % --      Oxygen Saturation (Exit) 96 % --      Rating of Perceived Exertion (Exercise) 13 13      Perceived Dyspnea (Exercise) 0 --      Symptoms R knee pain from arthritis 10/10 R knee pain      Comments walk test results first full day of exercise      Duration -- Progress to 30 minutes of  aerobic without signs/symptoms of physical distress      Intensity -- THRR unchanged             Progression      Progression -- Continue to progress workloads to maintain intensity without signs/symptoms of physical  distress.      Average METs -- 1.6             Resistance Training      Training Prescription -- Yes      Weight -- 3 lb      Reps -- 10-15             Interval Training      Interval Training -- No             NuStep      Level -- 1      Minutes -- 15      METs -- 1.7             Track      Laps -- 8      Minutes -- 15      METs -- 1.4              Exercise Comments:   Exercise Goals and Review:   Exercise Goals     Row Name 11/27/20 1636             Exercise Goals   Increase Physical Activity Yes       Intervention Provide advice, education, support and counseling about physical activity/exercise  needs.;Develop an individualized exercise prescription for aerobic and resistive training based on initial evaluation findings, risk stratification, comorbidities and participant's personal goals.       Expected Outcomes Short Term: Attend rehab on a regular basis to increase amount of physical activity.;Long Term: Add in home exercise to make exercise part of routine and to increase amount of physical activity.;Long Term: Exercising regularly at least 3-5 days a week.       Increase Strength and Stamina Yes       Intervention Provide advice, education, support and counseling about physical activity/exercise needs.;Develop an individualized exercise prescription for aerobic and resistive training based on initial evaluation findings, risk stratification, comorbidities and participant's personal goals.       Expected Outcomes Short Term: Increase workloads from initial exercise prescription for resistance, speed, and METs.;Short Term: Perform resistance training exercises routinely during rehab and add in resistance training at home;Long Term: Improve cardiorespiratory fitness, muscular endurance and strength as measured by increased METs and functional capacity (6MWT)       Able to understand and use rate of perceived exertion (RPE) scale Yes       Intervention Provide education and explanation on how to use RPE scale       Expected Outcomes Short Term: Able to use RPE daily in rehab to express subjective intensity level;Long Term:  Able to use RPE to guide intensity level when exercising independently       Able to understand and use Dyspnea scale Yes       Intervention Provide education and explanation on how to use Dyspnea scale       Expected Outcomes Short Term: Able to use Dyspnea scale daily in rehab to express subjective sense of shortness of breath during exertion;Long Term: Able to use Dyspnea scale to guide intensity level when exercising independently       Knowledge and understanding of  Target Heart Rate Range (THRR) Yes       Intervention Provide education and explanation of THRR including how the numbers were predicted and where they are located for reference       Expected Outcomes Short Term: Able to state/look up THRR;Short Term: Able to use daily as guideline for intensity in  rehab;Long Term: Able to use THRR to govern intensity when exercising independently       Able to check pulse independently Yes       Intervention Provide education and demonstration on how to check pulse in carotid and radial arteries.;Review the importance of being able to check your own pulse for safety during independent exercise       Expected Outcomes Short Term: Able to explain why pulse checking is important during independent exercise;Long Term: Able to check pulse independently and accurately       Understanding of Exercise Prescription Yes       Intervention Provide education, explanation, and written materials on patient's individual exercise prescription       Expected Outcomes Short Term: Able to explain program exercise prescription;Long Term: Able to explain home exercise prescription to exercise independently                Exercise Goals Re-Evaluation :  Exercise Goals Re-Evaluation     Row Name 12/03/20 1546 12/10/20 1310           Exercise Goal Re-Evaluation   Exercise Goals Review Increase Physical Activity;Able to understand and use rate of perceived exertion (RPE) scale;Knowledge and understanding of Target Heart Rate Range (THRR);Understanding of Exercise Prescription;Increase Strength and Stamina;Able to check pulse independently Increase Physical Activity;Increase Strength and Stamina;Understanding of Exercise Prescription      Comments Reviewed RPE and dyspnea scales, THR and program prescription with pt today.  Pt voiced understanding and was given a copy of goals to take home. Jocelyn Lamer has only completed her first full session of exercise.  She left early due to her  knee pain this week.  She is hoping to get an injection for it to help manage pain better.  She is going to keep [place] posted on how things are going.      Expected Outcomes Short: Use RPE daily to regulate intensity. Long: Follow program prescription in THR. Short: Get some knee pain relief and return to attendance Long: Follow program prescription               Discharge Exercise Prescription (Final Exercise Prescription Changes):  Exercise Prescription Changes - 12/10/20 1300       Response to Exercise   Blood Pressure (Admit) 112/66    Blood Pressure (Exercise) 140/70    Blood Pressure (Exit) 110/62    Heart Rate (Admit) 59 bpm    Heart Rate (Exercise) 103 bpm    Heart Rate (Exit) 74 bpm    Rating of Perceived Exertion (Exercise) 13    Symptoms R knee pain    Comments first full day of exercise    Duration Progress to 30 minutes of  aerobic without signs/symptoms of physical distress    Intensity THRR unchanged      Progression   Progression Continue to progress workloads to maintain intensity without signs/symptoms of physical distress.    Average METs 1.6      Resistance Training   Training Prescription Yes    Weight 3 lb    Reps 10-15      Interval Training   Interval Training No      NuStep   Level 1    Minutes 15    METs 1.7      Track   Laps 8    Minutes 15    METs 1.4             Nutrition:  Target Goals: Understanding of nutrition  guidelines, daily intake of sodium '1500mg'$ , cholesterol '200mg'$ , calories 30% from fat and 7% or less from saturated fats, daily to have 5 or more servings of fruits and vegetables.  Education: All About Nutrition: -Group instruction provided by verbal, written material, interactive activities, discussions, models, and posters to present general guidelines for heart healthy nutrition including fat, fiber, MyPlate, the role of sodium in heart healthy nutrition, utilization of the nutrition label, and utilization of this  knowledge for meal planning. Follow up email sent as well. Written material given at graduation. Flowsheet Row Cardiac Rehab from 02/09/2016 in [facility] Cardiac and Pulmonary Rehab  Date 12/08/15  Educator Jaclyn Shaggy  Instruction Review Code (retired) 2- meets goals/outcomes       Biometrics:  Pre Biometrics - 11/27/20 1629       Pre Biometrics   Height $Remov'5\' 6"'wRQfzi$  (1.676 m)    Weight 246 lb 3.2 oz (111.7 kg)    BMI (Calculated) 39.76    Single Leg Stand 9.6 seconds              Nutrition Therapy Plan and Nutrition Goals:   Nutrition Assessments:  MEDIFICTS Score Key: ?70 Need to make dietary changes  40-70 Heart Healthy Diet ? 40 Therapeutic Level Cholesterol Diet   Picture Your Plate Scores: <88 Unhealthy dietary pattern with much room for improvement. 41-50 Dietary pattern unlikely to meet recommendations for good health and room for improvement. 51-60 More healthful dietary pattern, with some room for improvement.  >60 Healthy dietary pattern, although there may be some specific behaviors that could be improved.    Nutrition Goals Re-Evaluation:   Nutrition Goals Discharge (Final Nutrition Goals Re-Evaluation):   Psychosocial: Target Goals: Acknowledge presence or absence of significant depression and/or stress, maximize coping skills, provide positive support system. Participant is able to verbalize types and ability to use techniques and skills needed for reducing stress and depression.   Education: Stress, Anxiety, and Depression - Group verbal and visual presentation to define topics covered.  Reviews how body is impacted by stress, anxiety, and depression.  Also discusses healthy ways to reduce stress and to treat/manage anxiety and depression.  Written material given at graduation. Flowsheet Row Cardiac Rehab from 02/09/2016 in [facility] Cardiac and Pulmonary Rehab  Date 12/03/15  Educator Kathreen Cornfield, [facility]  Instruction Review Code (retired) 2- meets goals/outcomes        Education: Sleep Hygiene -Provides group verbal and written instruction about how sleep can affect your health.  Define sleep hygiene, discuss sleep cycles and impact of sleep habits. Review good sleep hygiene tips.    Initial Review & Psychosocial Screening:  Initial Psych Review & Screening - 11/21/20 1430       Initial Review   Current issues with History of Depression;Current Stress Concerns      Family Dynamics   Good Support System? Yes      Barriers   Psychosocial barriers to participate in program The patient should benefit from training in stress management and relaxation.;There are no identifiable barriers or psychosocial needs.      Screening Interventions   Interventions Encouraged to exercise;Provide feedback about the scores to participant;To provide support and resources with identified psychosocial needs    Expected Outcomes Short Term goal: Utilizing psychosocial counselor, staff and physician to assist with identification of specific Stressors or current issues interfering with healing process. Setting desired goal for each stressor or current issue identified.;Long Term Goal: Stressors or current issues are controlled or eliminated.;Short Term goal: Identification and  review with participant of any Quality of Life or Depression concerns found by scoring the questionnaire.;Long Term goal: The participant improves quality of Life and PHQ9 Scores as seen by post scores and/or verbalization of changes             Quality of Life Scores:   Quality of Life - 11/27/20 1650       Quality of Life Scores   Health/Function Pre 22.65 %    Socioeconomic Pre 24.75 %    Psych/Spiritual Pre 28.29 %    Family Pre 28.8 %    GLOBAL Pre 25.36 %            Scores of 19 and below usually indicate a poorer quality of life in these areas.  A difference of  2-3 points is a clinically meaningful difference.  A difference of 2-3 points in the total score of the Quality of  Life Index has been associated with significant improvement in overall quality of life, self-image, physical symptoms, and general health in studies assessing change in quality of life.  PHQ-9: Recent Review Flowsheet Data     Depression screen [facility] 2/9 11/27/2020 11/17/2020 08/20/2020 08/01/2020 04/24/2020   Decreased Interest 0 0 0 0 0   Down, Depressed, Hopeless 0 0 0 0 0   PHQ - 2 Score 0 0 0 0 0   Altered sleeping 0 - - - -   Tired, decreased energy 0 - - - -   Change in appetite 0 - - - -   Feeling bad or failure about yourself  0 - - - -   Trouble concentrating 0 - - - -   Moving slowly or fidgety/restless 0 - - - -   Suicidal thoughts 0 - - - -   PHQ-9 Score 0 - - - -   Difficult doing work/chores Not difficult at all - - - -      Interpretation of Total Score  Total Score Depression Severity:  1-4 = Minimal depression, 5-9 = Mild depression, 10-14 = Moderate depression, 15-19 = Moderately severe depression, 20-27 = Severe depression   Psychosocial Evaluation and Intervention:  Psychosocial Evaluation - 11/21/20 1438       Psychosocial Evaluation & Interventions   Comments Alesandra is returning to Cardiac Rehab with another stent. She did some of the program in 2020. She states she is doing well and "doesn't let things stress me out." She reports to have a good support system, sleeping well, and is enjoying her life day to day. She is looking forward to coming to Cardiac Rehab to start back exercising and lose weight and hopes to get into a good routine.    Expected Outcomes Short: attend cardiac rehab for education and exercise. Long: develop and maintain positive self care habits.    Continue Psychosocial Services  Follow up required by staff             Psychosocial Re-Evaluation:   Psychosocial Discharge (Final Psychosocial Re-Evaluation):   Vocational Rehabilitation: Provide vocational rehab assistance to qualifying candidates.   Vocational Rehab Evaluation &  Intervention:  Vocational Rehab - 11/21/20 1430       Initial Vocational Rehab Evaluation & Intervention   Assessment shows need for Vocational Rehabilitation No             Education: Education Goals: Education classes will be provided on a variety of topics geared toward better understanding of heart health and risk factor modification. Participant will state understanding/return  demonstration of topics presented as noted by education test scores.  Learning Barriers/Preferences:  Learning Barriers/Preferences - 11/21/20 1430       Learning Barriers/Preferences   Learning Barriers Sight   glasses   Learning Preferences Individual Instruction;Verbal Instruction             General Cardiac Education Topics:  AED/CPR: - Group verbal and written instruction with the use of models to demonstrate the basic use of the AED with the basic ABC's of resuscitation.   Anatomy and Cardiac Procedures: - Group verbal and visual presentation and models provide information about basic cardiac anatomy and function. Reviews the testing methods done to diagnose heart disease and the outcomes of the test results. Describes the treatment choices: Medical Management, Angioplasty, or Coronary Bypass Surgery for treating various heart conditions including Myocardial Infarction, Angina, Valve Disease, and Cardiac Arrhythmias.  Written material given at graduation.   Medication Safety: - Group verbal and visual instruction to review commonly prescribed medications for heart and lung disease. Reviews the medication, class of the drug, and side effects. Includes the steps to properly store meds and maintain the prescription regimen.  Written material given at graduation.   Intimacy: - Group verbal instruction through game format to discuss how heart and lung disease can affect sexual intimacy. Written material given at graduation..   Know Your Numbers and Heart Failure: - Group verbal and visual  instruction to discuss disease risk factors for cardiac and pulmonary disease and treatment options.  Reviews associated critical values for Overweight/Obesity, Hypertension, Cholesterol, and Diabetes.  Discusses basics of heart failure: signs/symptoms and treatments.  Introduces Heart Failure Zone chart for action plan for heart failure.  Written material given at graduation.   Infection Prevention: - Provides verbal and written material to individual with discussion of infection control including proper hand washing and proper equipment cleaning during exercise session. Flowsheet Row Cardiac Rehab from 11/27/2020 in [facility] Cardiac and Pulmonary Rehab  Date 11/27/20  Educator [place]  Instruction Review Code 1- Verbalizes Understanding       Falls Prevention: - Provides verbal and written material to individual with discussion of falls prevention and safety. Flowsheet Row Cardiac Rehab from 11/27/2020 in [facility] Cardiac and Pulmonary Rehab  Date 11/27/20  Educator Filer  Instruction Review Code 1- Verbalizes Understanding       Other: -Provides group and verbal instruction on various topics (see comments)   Knowledge Questionnaire Score:  Knowledge Questionnaire Score - 11/27/20 1639       Knowledge Questionnaire Score   Pre Score 18/26: Heart attack, HF, Nutrition, Stress, Exercise, Nutrition             Core Components/Risk Factors/Patient Goals at Admission:  Personal Goals and Risk Factors at Admission - 11/27/20 1637       Core Components/Risk Factors/Patient Goals on Admission    Weight Management Yes;Obesity;Weight Loss    Intervention Weight Management: Develop a combined nutrition and exercise program designed to reach desired caloric intake, while maintaining appropriate intake of nutrient and fiber, sodium and fats, and appropriate energy expenditure required for the weight goal.;Weight Management: Provide education and appropriate resources to help participant work on and  attain dietary goals.;Weight Management/Obesity: Establish reasonable short term and long term weight goals.;Obesity: Provide education and appropriate resources to help participant work on and attain dietary goals.    Admit Weight 246 lb (111.6 kg)    Goal Weight: Short Term 240 lb (108.9 kg)    Goal Weight: Long Term 200  lb (90.7 kg)    Expected Outcomes Short Term: Continue to assess and modify interventions until short term weight is achieved;Long Term: Adherence to nutrition and physical activity/exercise program aimed toward attainment of established weight goal;Weight Loss: Understanding of general recommendations for a balanced deficit meal plan, which promotes 1-2 lb weight loss per week and includes a negative energy balance of [phone] kcal/d;Understanding recommendations for meals to include 15-35% energy as protein, 25-35% energy from fat, 35-60% energy from carbohydrates, less than $RemoveB'200mg'aGyBlhIb$  of dietary cholesterol, 20-35 gm of total fiber daily;Understanding of distribution of calorie intake throughout the day with the consumption of 4-5 meals/snacks    Diabetes Yes    Intervention Provide education about signs/symptoms and action to take for hypo/hyperglycemia.;Provide education about proper nutrition, including hydration, and aerobic/resistive exercise prescription along with prescribed medications to achieve blood glucose in normal ranges: Fasting glucose 65-99 mg/dL    Expected Outcomes Short Term: Participant verbalizes understanding of the signs/symptoms and immediate care of hyper/hypoglycemia, proper foot care and importance of medication, aerobic/resistive exercise and nutrition plan for blood glucose control.;Long Term: Attainment of HbA1C < 7%.    Hypertension Yes    Intervention Provide education on lifestyle modifcations including regular physical activity/exercise, weight management, moderate sodium restriction and increased consumption of fresh fruit, vegetables, and low fat dairy,  alcohol moderation, and smoking cessation.;Monitor prescription use compliance.    Expected Outcomes Short Term: Continued assessment and intervention until BP is < 140/77mm HG in hypertensive participants. < 130/35mm HG in hypertensive participants with diabetes, heart failure or chronic kidney disease.;Long Term: Maintenance of blood pressure at goal levels.    Lipids Yes    Intervention Provide education and support for participant on nutrition & aerobic/resistive exercise along with prescribed medications to achieve LDL '70mg'$ , HDL >$Remo'40mg'gKQqh$ .    Expected Outcomes Short Term: Participant states understanding of desired cholesterol values and is compliant with medications prescribed. Participant is following exercise prescription and nutrition guidelines.;Long Term: Cholesterol controlled with medications as prescribed, with individualized exercise RX and with personalized nutrition plan. Value goals: LDL < $Rem'70mg'yeAz$ , HDL > 40 mg.             Education:Diabetes - Individual verbal and written instruction to review signs/symptoms of diabetes, desired ranges of glucose level fasting, after meals and with exercise. Acknowledge that pre and post exercise glucose checks will be done for 3 sessions at entry of program. [place] from 11/27/2020 in [facility] Cardiac and Pulmonary Rehab  Education need identified 11/27/20  Date 11/27/20  Educator Diaz  Instruction Review Code 1- Verbalizes Understanding       Core Components/Risk Factors/Patient Goals Review:    Core Components/Risk Factors/Patient Goals at Discharge (Final Review):    ITP Comments:  ITP Comments     Reeder Name 11/21/20 1432 11/27/20 1704 12/03/20 1546 12/17/20 0930     ITP Comments Initial telephone orienation completed. Diagnosis can be found in [facility] 5/3. EP orientation scheduled for Thursday 5/26 at 3pm. Completed 6MWT and gym orientation. Initial ITP created and sent for review to Dr. Emily Filbert, Medical Director. First  full day of exercise!  Patient was oriented to gym and equipment including functions, settings, policies, and procedures.  Patient's individual exercise prescription and treatment plan were reviewed.  All starting workloads were established based on the results of the 6 minute walk test done at initial orientation visit.  The plan for exercise progression was also introduced and progression will be customized based on patient's performance and  goals. 30 Day review completed. Medical Director ITP review done, changes made as directed, and signed approval by Medical Director.  2 visits in June             Comments:

## 2020-12-18 ENCOUNTER — Encounter: Payer: Medicare Other | Admitting: *Deleted

## 2020-12-18 ENCOUNTER — Other Ambulatory Visit: Payer: Self-pay

## 2020-12-18 DIAGNOSIS — Z955 Presence of coronary angioplasty implant and graft: Secondary | ICD-10-CM

## 2020-12-18 DIAGNOSIS — Z48812 Encounter for surgical aftercare following surgery on the circulatory system: Secondary | ICD-10-CM | POA: Diagnosis not present

## 2020-12-18 LAB — GLUCOSE, CAPILLARY
Glucose-Capillary: 154 mg/dL — ABNORMAL HIGH (ref 70–99)
Glucose-Capillary: 140 mg/dL — ABNORMAL HIGH (ref 70–99)

## 2020-12-18 NOTE — Progress Notes (Signed)
Daily Session Note  Patient Details  Name: Felicia Woods MRN: [id number] Date of Birth: 1955/11/26 Referring Provider:   Flowsheet Row Cardiac Rehab from 11/27/2020 in [facility] Cardiac and Pulmonary Rehab  Referring Provider Serafina Royals MD       Encounter Date: 12/18/2020  Check In:  Session Check In - 12/18/20 1558       Check-In   Supervising physician immediately available to respond to emergencies See telemetry face sheet for immediately available ER MD    Location [facility]-Cardiac & Pulmonary Rehab    Staff Present Renita Papa, RN BSN;Joseph Hood RCP,RRT,BSRT;Melissa Dighton RDN, LDN    Virtual Visit No    Medication changes reported     No    Fall or balance concerns reported    No    Warm-up and Cool-down Performed on first and last piece of equipment    Resistance Training Performed Yes    VAD Patient? No    PAD/SET Patient? No      Pain Assessment   Currently in Pain? No/denies                Social History   Tobacco Use  Smoking Status Never  Smokeless Tobacco Never    Goals Met:  Independence with exercise equipment Exercise tolerated well No report of cardiac concerns or symptoms Strength training completed today  Goals Unmet:  Not Applicable  Comments: Pt able to follow exercise prescription today without complaint.  Will continue to monitor for progression.    Dr. Emily Filbert is Medical Director for [place].  Dr. Ottie Glazier is Medical Director for [facility] Pulmonary Rehabilitation.

## 2020-12-19 ENCOUNTER — Other Ambulatory Visit: Payer: Self-pay

## 2020-12-19 DIAGNOSIS — E1165 Type 2 diabetes mellitus with hyperglycemia: Secondary | ICD-10-CM

## 2020-12-19 DIAGNOSIS — J302 Other seasonal allergic rhinitis: Secondary | ICD-10-CM

## 2020-12-19 DIAGNOSIS — I119 Hypertensive heart disease without heart failure: Secondary | ICD-10-CM

## 2020-12-19 MED ORDER — NITROGLYCERIN 0.4 MG SL SUBL: 0.4000 mg | SUBLINGUAL_TABLET | SUBLINGUAL | 0 refills | Status: DC | PRN

## 2020-12-19 MED ORDER — TRULICITY 1.5 MG/0.5ML ~~LOC~~ SOAJ
SUBCUTANEOUS | 1 refills | Status: DC
Start: 2020-12-19 — End: 2021-09-03

## 2020-12-22 ENCOUNTER — Encounter: Payer: Medicare Other | Admitting: *Deleted

## 2020-12-22 ENCOUNTER — Other Ambulatory Visit: Payer: Self-pay

## 2020-12-22 DIAGNOSIS — Z48812 Encounter for surgical aftercare following surgery on the circulatory system: Secondary | ICD-10-CM | POA: Diagnosis not present

## 2020-12-22 DIAGNOSIS — Z955 Presence of coronary angioplasty implant and graft: Secondary | ICD-10-CM

## 2020-12-22 LAB — GLUCOSE, CAPILLARY
Glucose-Capillary: 134 mg/dL — ABNORMAL HIGH (ref 70–99)
Glucose-Capillary: 121 mg/dL — ABNORMAL HIGH (ref 70–99)

## 2020-12-22 NOTE — Progress Notes (Signed)
Daily Session Note  Patient Details  Name: Felicia Woods MRN: [id number] Date of Birth: 11-16-1955 Referring Provider:   Flowsheet Row Cardiac Rehab from 11/27/2020 in [facility] Cardiac and Pulmonary Rehab  Referring Provider Serafina Royals MD       Encounter Date: 12/22/2020  Check In:  Session Check In - 12/22/20 [place]       Check-In   Supervising physician immediately available to respond to emergencies See telemetry face sheet for immediately available ER MD    Location [facility]-Cardiac & Pulmonary Rehab    Staff Present Renita Papa, RN Margurite Auerbach, MS, ASCM CEP, Exercise Physiologist;Kelly Amedeo Plenty, BS, ACSM CEP, Exercise Physiologist    Virtual Visit No    Medication changes reported     No    Fall or balance concerns reported    No    Warm-up and Cool-down Performed on first and last piece of equipment    Resistance Training Performed Yes    VAD Patient? No    PAD/SET Patient? No      Pain Assessment   Currently in Pain? No/denies                Social History   Tobacco Use  Smoking Status Never  Smokeless Tobacco Never    Goals Met:  Independence with exercise equipment Exercise tolerated well No report of cardiac concerns or symptoms Strength training completed today  Goals Unmet:  Not Applicable  Comments: Pt able to follow exercise prescription today without complaint.  Will continue to monitor for progression.    Dr. Emily Filbert is Medical Director for [place].  Dr. Ottie Glazier is Medical Director for [facility] Pulmonary Rehabilitation.

## 2020-12-24 ENCOUNTER — Telehealth: Payer: Self-pay

## 2020-12-24 NOTE — Telephone Encounter (Signed)
Pt will come and pick up samples of Trulicity

## 2020-12-26 ENCOUNTER — Telehealth: Payer: Self-pay

## 2020-12-26 NOTE — Telephone Encounter (Signed)
Trulicity approved  5-70-17 to 12-24-21, pt is aware

## 2020-12-31 ENCOUNTER — Other Ambulatory Visit: Payer: Self-pay

## 2020-12-31 DIAGNOSIS — Z955 Presence of coronary angioplasty implant and graft: Secondary | ICD-10-CM

## 2020-12-31 DIAGNOSIS — Z48812 Encounter for surgical aftercare following surgery on the circulatory system: Secondary | ICD-10-CM | POA: Diagnosis not present

## 2020-12-31 NOTE — Progress Notes (Signed)
Daily Session Note  Patient Details  Name: Felicia Woods MRN: [id number] Date of Birth: 17-Dec-1955 Referring Provider:   Flowsheet Row Cardiac Rehab from 11/27/2020 in [facility] Cardiac and Pulmonary Rehab  Referring Provider Serafina Royals MD       Encounter Date: 12/31/2020  Check In:  Session Check In - 12/31/20 1603       Check-In   Supervising physician immediately available to respond to emergencies See telemetry face sheet for immediately available ER MD    Location [facility]-Cardiac & Pulmonary Rehab    Staff Present Birdie Sons, MPA, Nino Glow, MS, ASCM CEP, Exercise Physiologist;Joseph Tessie Fass, [place]    Virtual Visit No    Medication changes reported     No    Fall or balance concerns reported    No    Warm-up and Cool-down Performed on first and last piece of equipment    Resistance Training Performed Yes    VAD Patient? No    PAD/SET Patient? No      Pain Assessment   Currently in Pain? No/denies                Social History   Tobacco Use  Smoking Status Never  Smokeless Tobacco Never    Goals Met:  Independence with exercise equipment Exercise tolerated well No report of cardiac concerns or symptoms Strength training completed today  Goals Unmet:  Not Applicable  Comments: Pt able to follow exercise prescription today without complaint.  Will continue to monitor for progression.    Dr. Emily Filbert is Medical Director for [place].  Dr. Ottie Glazier is Medical Director for [facility] Pulmonary Rehabilitation.

## 2021-01-01 ENCOUNTER — Other Ambulatory Visit: Payer: Self-pay

## 2021-01-01 ENCOUNTER — Encounter: Payer: Medicare Other | Admitting: *Deleted

## 2021-01-01 DIAGNOSIS — Z955 Presence of coronary angioplasty implant and graft: Secondary | ICD-10-CM

## 2021-01-01 DIAGNOSIS — Z48812 Encounter for surgical aftercare following surgery on the circulatory system: Secondary | ICD-10-CM | POA: Diagnosis not present

## 2021-01-01 NOTE — Progress Notes (Signed)
Daily Session Note  Patient Details  Name: Felicia Woods MRN: [id number] Date of Birth: [birth year] Referring Provider:   Flowsheet Row Cardiac Rehab from 11/27/2020 in [facility] Cardiac and Pulmonary Rehab  Referring Provider Serafina Royals MD       Encounter Date: 01/01/2021  Check In:  Session Check In - 01/01/21 1551       Check-In   Supervising physician immediately available to respond to emergencies See telemetry face sheet for immediately available ER MD    Location [facility]-Cardiac & Pulmonary Rehab    Staff Present Renita Papa, RN BSN;Jessica Luan Pulling, MA, RCEP, CCRP, CCET;Joseph Colona, [place]    Virtual Visit No    Medication changes reported     No    Fall or balance concerns reported    No    Warm-up and Cool-down Performed on first and last piece of equipment    Resistance Training Performed Yes    VAD Patient? No    PAD/SET Patient? No      Pain Assessment   Currently in Pain? No/denies                Social History   Tobacco Use  Smoking Status Never  Smokeless Tobacco Never    Goals Met:  Independence with exercise equipment Exercise tolerated well No report of cardiac concerns or symptoms Strength training completed today  Goals Unmet:  Not Applicable  Comments: Pt able to follow exercise prescription today without complaint.  Will continue to monitor for progression.    Dr. Emily Filbert is Medical Director for [place].  Dr. Ottie Glazier is Medical Director for [facility] Pulmonary Rehabilitation.

## 2021-01-07 ENCOUNTER — Other Ambulatory Visit: Payer: Self-pay

## 2021-01-07 ENCOUNTER — Telehealth: Payer: Self-pay

## 2021-01-07 ENCOUNTER — Other Ambulatory Visit: Payer: Self-pay | Admitting: Physician Assistant

## 2021-01-07 DIAGNOSIS — G4709 Other insomnia: Secondary | ICD-10-CM

## 2021-01-07 MED ORDER — ZOLPIDEM TARTRATE 10 MG PO TABS
10.0000 mg | ORAL_TABLET | Freq: Every evening | ORAL | 0 refills | Status: DC | PRN
Start: 2021-01-07 — End: 2021-01-14

## 2021-01-07 NOTE — Telephone Encounter (Signed)
Pt advised we send med  

## 2021-01-08 ENCOUNTER — Encounter: Payer: Medicare Other | Attending: Internal Medicine | Admitting: *Deleted

## 2021-01-08 ENCOUNTER — Other Ambulatory Visit: Payer: Self-pay

## 2021-01-08 DIAGNOSIS — Z955 Presence of coronary angioplasty implant and graft: Secondary | ICD-10-CM | POA: Diagnosis not present

## 2021-01-08 DIAGNOSIS — Z48812 Encounter for surgical aftercare following surgery on the circulatory system: Secondary | ICD-10-CM | POA: Diagnosis present

## 2021-01-08 NOTE — Progress Notes (Signed)
Daily Session Note  Patient Details  Name: Felicia Woods MRN: [id number] Date of Birth: 26-Aug-1955 Referring Provider:   Flowsheet Row Cardiac Rehab from 11/27/2020 in [facility] Cardiac and Pulmonary Rehab  Referring Provider Serafina Royals MD       Encounter Date: 01/08/2021  Check In:  Session Check In - 01/08/21 1554       Check-In   Supervising physician immediately available to respond to emergencies See telemetry face sheet for immediately available ER MD    Location [facility]-Cardiac & Pulmonary Rehab    Staff Present Renita Papa, RN BSN;Melissa [place], RDN, Tawanna Solo, MS, ASCM CEP, Exercise Physiologist    Virtual Visit No    Medication changes reported     No    Fall or balance concerns reported    No    Warm-up and Cool-down Performed on first and last piece of equipment    Resistance Training Performed Yes    VAD Patient? No    PAD/SET Patient? No      Pain Assessment   Currently in Pain? No/denies                Social History   Tobacco Use  Smoking Status Never  Smokeless Tobacco Never    Goals Met:  Independence with exercise equipment Exercise tolerated well No report of cardiac concerns or symptoms Strength training completed today  Goals Unmet:  Not Applicable  Comments: Pt able to follow exercise prescription today without complaint.  Will continue to monitor for progression.    Dr. Emily Filbert is Medical Director for [place].  Dr. Ottie Glazier is Medical Director for [facility] Pulmonary Rehabilitation.

## 2021-01-14 ENCOUNTER — Encounter: Payer: Self-pay | Admitting: *Deleted

## 2021-01-14 ENCOUNTER — Encounter: Payer: Self-pay | Admitting: Advanced Practice Midwife

## 2021-01-14 ENCOUNTER — Ambulatory Visit (INDEPENDENT_AMBULATORY_CARE_PROVIDER_SITE_OTHER): Payer: Medicaid Other | Admitting: Advanced Practice Midwife

## 2021-01-14 ENCOUNTER — Other Ambulatory Visit: Payer: Self-pay

## 2021-01-14 ENCOUNTER — Encounter: Payer: Medicare Other | Admitting: *Deleted

## 2021-01-14 VITALS — BP 120/80 | Ht 67.0 in | Wt 245.0 lb

## 2021-01-14 DIAGNOSIS — Z48812 Encounter for surgical aftercare following surgery on the circulatory system: Secondary | ICD-10-CM | POA: Diagnosis not present

## 2021-01-14 DIAGNOSIS — Z955 Presence of coronary angioplasty implant and graft: Secondary | ICD-10-CM

## 2021-01-14 DIAGNOSIS — Z0000 Encounter for general adult medical examination without abnormal findings: Secondary | ICD-10-CM | POA: Diagnosis not present

## 2021-01-14 DIAGNOSIS — G4709 Other insomnia: Secondary | ICD-10-CM

## 2021-01-14 MED ORDER — ZOLPIDEM TARTRATE 10 MG PO TABS: 10.0000 mg | ORAL_TABLET | Freq: Every evening | ORAL | 0 refills | Status: DC | PRN

## 2021-01-14 NOTE — Progress Notes (Signed)
Daily Session Note  Patient Details  Name: Felicia Woods MRN: [id number] Date of Birth: 11/07/1955 Referring Provider:   Flowsheet Row Cardiac Rehab from 11/27/2020 in [facility] Cardiac and Pulmonary Rehab  Referring Provider Serafina Royals MD       Encounter Date: 01/14/2021  Check In:  Session Check In - 01/14/21 1532       Check-In   Supervising physician immediately available to respond to emergencies See telemetry face sheet for immediately available ER MD    Location [facility]-Cardiac & Pulmonary Rehab    Staff Present Heath Lark, RN, BSN, CCRP;Melissa Platea, RDN, Tawanna Solo, MS, ASCM CEP, Exercise Physiologist;Kelly Rosalia Hammers, MPA, RN    Virtual Visit No    Medication changes reported     No    Fall or balance concerns reported    No    Warm-up and Cool-down Performed on first and last piece of equipment    Resistance Training Performed Yes    VAD Patient? No    PAD/SET Patient? No      Pain Assessment   Currently in Pain? No/denies                Social History   Tobacco Use  Smoking Status Never  Smokeless Tobacco Never    Goals Met:  Independence with exercise equipment Exercise tolerated well No report of cardiac concerns or symptoms  Goals Unmet:  Not Applicable  Comments: Pt able to follow exercise prescription today without complaint.  Will continue to monitor for progression.     Dr. Emily Filbert is Medical Director for [place].  Dr. Ottie Glazier is Medical Director for [facility] Pulmonary Rehabilitation.

## 2021-01-14 NOTE — Progress Notes (Signed)
Cardiac Individual Treatment Plan  Patient Details  Name: Felicia Woods MRN: [id number] Date of Birth: 1955-08-04 Referring Provider:   Flowsheet Row Cardiac Rehab from 11/27/2020 in [facility] Cardiac and Pulmonary Rehab  Referring Provider Serafina Royals MD       Initial Encounter Date:  Flowsheet Row Cardiac Rehab from 11/27/2020 in [facility] Cardiac and Pulmonary Rehab  Date 11/27/20       Visit Diagnosis: S/P coronary artery stent placement  Patient's Home Medications on Admission:  Current Outpatient Medications:    Accu-Chek Softclix Lancets lancets, Use as instructed twice a day to check blood sugars  DIAG -E11.59, Disp: 100 each, Rfl: 3   albuterol (PROVENTIL HFA;VENTOLIN HFA) 108 (90 Base) MCG/ACT inhaler, Inhale 2 puffs into the lungs every 6 (six) hours as needed for wheezing or shortness of breath., Disp: , Rfl:    amLODipine (NORVASC) 10 MG tablet, Take 1 tablet (10 mg total) by mouth daily., Disp: 90 tablet, Rfl: 1   aspirin EC 81 MG tablet, Take 81 mg by mouth daily., Disp: , Rfl:    atorvastatin (LIPITOR) 80 MG tablet, Take 1 tablet (80 mg total) by mouth daily., Disp: 30 tablet, Rfl: 2   bisoprolol-hydrochlorothiazide (ZIAC) 5-6.25 MG tablet, Take 1 tablet by mouth daily., Disp: 90 tablet, Rfl: 2   canagliflozin (INVOKANA) 300 MG TABS tablet, Take 1 tablet (300 mg total) by mouth daily before breakfast., Disp: 30 tablet, Rfl: 2   clopidogrel (PLAVIX) 75 MG tablet, Take 1 tablet (75 mg total) by mouth daily., Disp: 90 tablet, Rfl: 3   cyclobenzaprine (FLEXERIL) 10 MG tablet, Take 1 tablet (10 mg total) by mouth at bedtime., Disp: 30 tablet, Rfl: 0   Dulaglutide (TRULICITY) 1.5 CB/7.6EG SOPN, INJECT 1.5MG INTO THE SKIN ONCE A WEEK, Disp: 12 mL, Rfl: 1   famotidine (PEPCID) 20 MG tablet, Take 1 tablet (20 mg total) by mouth 2 (two) times daily., Disp: 60 tablet, Rfl: 2   furosemide (LASIX) 40 MG tablet, Take 1 tablet (40 mg total) by mouth 2 (two) times daily., Disp: 180  tablet, Rfl: 1   glucose blood (ACCU-CHEK GUIDE) test strip, Use as instructed to check blood sugars twice a day  E11.59, Disp: 100 each, Rfl: 3   isosorbide mononitrate (IMDUR) 60 MG 24 hr tablet, Take 60 mg by mouth daily., Disp: , Rfl:    losartan (COZAAR) 100 MG tablet, Take 1 tablet (100 mg total) by mouth daily. Take one tab po qd, Disp: 90 tablet, Rfl: 1   metoprolol succinate (TOPROL-XL) 25 MG 24 hr tablet, Take 25 mg by mouth daily., Disp: , Rfl:    montelukast (SINGULAIR) 10 MG tablet, Take 1 tablet (10 mg total) by mouth at bedtime., Disp: 90 tablet, Rfl: 3   nitroGLYCERIN (NITROSTAT) 0.4 MG SL tablet, Place 1 tablet (0.4 mg total) under the tongue every 5 (five) minutes x 3 doses as needed for chest pain. *If no relief call MD or go to Emergency Room*, Disp: 30 tablet, Rfl: 0   oxybutynin (DITROPAN-XL) 5 MG 24 hr tablet, Take 1 tablet (5 mg total) by mouth daily with supper., Disp: 90 tablet, Rfl: 2   zolpidem (AMBIEN) 10 MG tablet, Take 1 tablet (10 mg total) by mouth at bedtime as needed for sleep., Disp: 30 tablet, Rfl: 0  Past Medical History: Past Medical History:  Diagnosis Date   Asthma    Coronary artery disease    Diabetes mellitus without complication ([place])    Heart attack ([place])  Hyperlipidemia    Hypertension    MI (myocardial infarction) ([place])    Migraine headache with aura    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    Tobacco Use: Social History   Tobacco Use  Smoking Status Never  Smokeless Tobacco Never    Labs: Recent Review Flowsheet Data     Labs for ITP Cardiac and Pulmonary Rehab Latest Ref Rng & Units 09/17/2019 11/13/2019 03/06/2020 08/20/2020 11/17/2020   Cholestrol 100 - 199 mg/dL 188 - - - -   LDLCALC 0 - 99 mg/dL 126(H) - - - -   HDL >39 mg/dL 38(L) - - - -   Trlycerides 0 - 149 mg/dL 133 - - - -   Hemoglobin A1c 4.0 - 5.6 % - 6.8(A) 6.9(A) 7.6(A) 7.5(A)   TCO2 0 - 100 mmol/L - - - - -        Exercise Target Goals: Exercise Program  Goal: Individual exercise prescription set using results from initial 6 min walk test and THRR while considering  patient's activity barriers and safety.   Exercise Prescription Goal: Initial exercise prescription builds to 30-45 minutes a day of aerobic activity, 2-3 days per week.  Home exercise guidelines will be given to patient during program as part of exercise prescription that the participant will acknowledge.   Education: Aerobic Exercise: - Group verbal and visual presentation on the components of exercise prescription. Introduces F.I.T.T principle from ACSM for exercise prescriptions.  Reviews F.I.T.T. principles of aerobic exercise including progression. Written material given at graduation.   Education: Resistance Exercise: - Group verbal and visual presentation on the components of exercise prescription. Introduces F.I.T.T principle from ACSM for exercise prescriptions  Reviews F.I.T.T. principles of resistance exercise including progression. Written material given at graduation.    Education: Exercise & Equipment Safety: - Individual verbal instruction and demonstration of equipment use and safety with use of the equipment. Flowsheet Row Cardiac Rehab from 11/27/2020 in [facility] Cardiac and Pulmonary Rehab  Date 11/27/20  Educator [place]  Instruction Review Code 1- Verbalizes Understanding       Education: Exercise Physiology & General Exercise Guidelines: - Group verbal and written instruction with models to review the exercise physiology of the cardiovascular system and associated critical values. Provides general exercise guidelines with specific guidelines to those with heart or lung disease.    Education: Flexibility, Balance, Mind/Body Relaxation: - Group verbal and visual presentation with interactive activity on the components of exercise prescription. Introduces F.I.T.T principle from ACSM for exercise prescriptions. Reviews F.I.T.T. principles of flexibility and balance  exercise training including progression. Also discusses the mind body connection.  Reviews various relaxation techniques to help reduce and manage stress (i.e. Deep breathing, progressive muscle relaxation, and visualization). Balance handout provided to take home. Written material given at graduation.   Activity Barriers & Risk Stratification:  Activity Barriers & Cardiac Risk Stratification - 11/27/20 1629       Activity Barriers & Cardiac Risk Stratification   Activity Barriers Back Problems;Joint Problems;Arthritis;Deconditioning;Muscular Weakness    Cardiac Risk Stratification High             6 Minute Walk:  6 Minute Walk     Row Name 11/27/20 1633         6 Minute Walk   Phase Initial     Distance 470 feet     Walk Time 6 minutes     # of Rest Breaks 0     MPH 0.89  METS 0.95     RPE 13     Perceived Dyspnea  0     VO2 Peak 3.35     Symptoms Yes (comment)     Comments Right knee- arthritis pain 10/10     Resting HR 66 bpm     Resting BP 104/62     Resting Oxygen Saturation  96 %     Exercise Oxygen Saturation  during 6 min walk 95 %     Max Ex. HR 89 bpm     Max Ex. BP 122/62     2 Minute Post BP 118/64              Oxygen Initial Assessment:   Oxygen Re-Evaluation:   Oxygen Discharge (Final Oxygen Re-Evaluation):   Initial Exercise Prescription:  Initial Exercise Prescription - 11/27/20 1600       Date of Initial Exercise RX and Referring Provider   Date 11/27/20    Referring Provider Serafina Royals MD      Recumbant Bike   Level 1    RPM 60    Watts 15    Minutes 15    METs 1      NuStep   Level 1    SPM 80    Minutes 15    METs 1      Track   Laps 10    Minutes 15    METs 1      Prescription Details   Frequency (times per week) 3    Duration Progress to 30 minutes of continuous aerobic without signs/symptoms of physical distress      Intensity   THRR 40-80% of Max Heartrate 102-138    Ratings of Perceived  Exertion 11-13    Perceived Dyspnea 0-4      Progression   Progression Continue to progress workloads to maintain intensity without signs/symptoms of physical distress.      Resistance Training   Training Prescription Yes    Weight 3 lb    Reps 10-15             Perform Capillary Blood Glucose checks as needed.  Exercise Prescription Changes:   Exercise Prescription Changes     Row Name 11/27/20 1600 12/10/20 1300 12/24/20 1500 01/06/21 1500 01/08/21 1600     Response to Exercise   Blood Pressure (Admit) 104/62 112/66 102/62 126/60 --   Blood Pressure (Exercise) 122/62 140/70 124/64 136/74 --   Blood Pressure (Exit) 118/64 110/62 104/70 132/76 --   Heart Rate (Admit) 66 bpm 59 bpm 74 bpm 68 bpm --   Heart Rate (Exercise) 89 bpm 103 bpm 76 bpm 101 bpm --   Heart Rate (Exit) 66 bpm 74 bpm 71 bpm 99 bpm --   Oxygen Saturation (Admit) 96 % -- -- -- --   Oxygen Saturation (Exercise) 95 % -- -- -- --   Oxygen Saturation (Exit) 96 % -- -- -- --   Rating of Perceived Exertion (Exercise) '13 13 11 11 ' --   Perceived Dyspnea (Exercise) 0 -- -- -- --   Symptoms R knee pain from arthritis 10/10 R knee pain -- R knee pain --   Comments walk test results first full day of exercise -- -- --   Duration -- Progress to 30 minutes of  aerobic without signs/symptoms of physical distress Progress to 30 minutes of  aerobic without signs/symptoms of physical distress Continue with 30 min of aerobic exercise without signs/symptoms of physical distress. --  Intensity -- THRR unchanged THRR unchanged THRR unchanged --     Progression   Progression -- Continue to progress workloads to maintain intensity without signs/symptoms of physical distress. Continue to progress workloads to maintain intensity without signs/symptoms of physical distress. Continue to progress workloads to maintain intensity without signs/symptoms of physical distress. 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  Start with 1 day   Initial Home Exercises Provided -- -- -- -- 01/08/21            Exercise Comments:   Exercise Goals and Review:   Exercise Goals     Row Name 11/27/20 1636             Exercise Goals   Increase Physical Activity Yes       Intervention Provide advice, education, support and counseling about physical activity/exercise needs.;Develop an individualized exercise prescription for aerobic and resistive training based on initial evaluation findings, risk stratification, comorbidities and participant's personal goals.       Expected Outcomes Short Term: Attend rehab on a regular basis to increase amount of physical activity.;Long Term: Add in home exercise to make exercise part of routine and to increase amount of physical activity.;Long Term: Exercising regularly at least 3-5 days a week.       Increase Strength and Stamina Yes       Intervention Provide advice, education, support and counseling about physical activity/exercise needs.;Develop an individualized exercise prescription for aerobic and resistive training based on initial evaluation findings, risk stratification, comorbidities and participant's personal goals.       Expected Outcomes Short Term: Increase workloads from initial exercise prescription for resistance, speed, and METs.;Short Term: Perform resistance training exercises routinely during rehab and add in resistance training at  home;Long Term: Improve cardiorespiratory fitness, muscular endurance and strength as measured by increased METs and functional capacity (6MWT)       Able to understand and use rate of perceived exertion (RPE) scale Yes       Intervention Provide education and explanation on how to use RPE scale       Expected Outcomes Short Term: Able to use RPE daily in rehab to express subjective intensity level;Long Term:  Able to use RPE to guide intensity level when exercising independently       Able to understand and use Dyspnea scale Yes       Intervention Provide education and explanation on how to use Dyspnea scale       Expected Outcomes Short Term: Able to use Dyspnea scale daily in rehab to express subjective sense of shortness of breath during exertion;Long Term:  Able to use Dyspnea scale to guide intensity level when exercising independently       Knowledge and understanding of Target Heart Rate Range (THRR) Yes       Intervention Provide education and explanation of THRR including how the numbers were predicted and where they are located for reference       Expected Outcomes Short Term: Able to state/look up THRR;Short Term: Able to use daily as guideline for intensity in rehab;Long Term: Able to use THRR to govern intensity when exercising independently       Able to check pulse independently Yes       Intervention Provide education and demonstration on how to check pulse in carotid and radial arteries.;Review the importance of being able to check your own pulse for safety during independent exercise       Expected Outcomes Short Term: Able to explain why pulse checking is important during independent exercise;Long Term: Able to check pulse independently and accurately       Understanding of Exercise Prescription Yes       Intervention Provide education, explanation, and written materials on patient's individual exercise prescription       Expected Outcomes Short Term: Able to explain program  exercise prescription;Long Term: Able to explain home exercise prescription to exercise independently                Exercise Goals Re-Evaluation :  Exercise Goals Re-Evaluation     Row Name 12/03/20 1546 12/10/20 1310 12/24/20 1552 01/06/21 1557 01/08/21 1623     Exercise Goal Re-Evaluation   Exercise Goals Review Increase Physical Activity;Able to understand and use rate of perceived exertion (RPE) scale;Knowledge and understanding of Target Heart Rate Range (THRR);Understanding of Exercise Prescription;Increase Strength and Stamina;Able to check pulse independently Increase Physical Activity;Increase Strength and Stamina;Understanding of Exercise Prescription Increase Physical Activity;Increase Strength and Stamina Increase Physical Activity;Increase Strength and Stamina;Understanding of Exercise Prescription Increase Physical Activity;Increase Strength and Stamina;Understanding of Exercise Prescription   Comments Reviewed RPE and dyspnea scales, THR and program prescription with pt today.  Pt voiced understanding and was given a copy of goals to take home. Jocelyn Lamer has only completed her first full session of exercise.  She left early due to her knee pain this week.  She is hoping to get an injection for it to help manage pain better.  She is going to keep [place] posted on how things are going. Jocelyn Lamer has only attended 3 times this month.  COnsistent attendance is necessary for good progress.  Staff will monitor while in session. [place] continues to have inconsistent attendance.  She was here 5 times over the past month total.  She also continues to struggle with her knee.  We will continue to montior her progress and encourage improved attendance. Reviewed home exercise with pt today.  Pt plans to complete Youtube videos and is looking into the [employer]  for exercise.  Reviewed THR, pulse, RPE, sign and symptoms, pulse oximetery and when to call 911 or MD.  Also discussed weather considerations and  indoor options.  Pt voiced understanding.   Expected Outcomes Short: Use RPE daily to regulate intensity. Long: Follow program prescription in THR. Short: Get some knee pain relief and return to attendance Long: Follow program prescription Short: get back to consistent attendance Long:  build stamina Short: Regular attendance Long: Continue to improve stamina. Short: Add on 1 day of exercise Long: Exercise independently as home at appropriate exercise prescription  Discharge Exercise Prescription (Final Exercise Prescription Changes):  Exercise Prescription Changes - 01/08/21 1600       Home Exercise Plan   Plans to continue exercise at [facility] (comment)   [place] videos   Frequency Add 2 additional days to program exercise sessions.   Start with 1 day   Initial Home Exercises Provided 01/08/21             Nutrition:  Target Goals: Understanding of nutrition guidelines, daily intake of sodium <1540m, cholesterol <2045m calories 30% from fat and 7% or less from saturated fats, daily to have 5 or more servings of fruits and vegetables.  Education: All About Nutrition: -Group instruction provided by verbal, written material, interactive activities, discussions, models, and posters to present general guidelines for heart healthy nutrition including fat, fiber, MyPlate, the role of sodium in heart healthy nutrition, utilization of the nutrition label, and utilization of this knowledge for meal planning. Follow up email sent as well. Written material given at graduation. Flow[facility]  Date 12/08/15  Educator CoJaclyn ShaggyInstruction Review Code (retired) 2- meets goals/outcomes       Biometrics:  Pre Biometrics - 11/27/20 1629       Pre Biometrics   Height '5\' 6"'  (1.676 m)    Weight 246 lb 3.2 oz (111.7 kg)    BMI (Calculated) 39.76    Single Leg Stand 9.6 seconds               Nutrition Therapy Plan and Nutrition Goals:  Nutrition Therapy & Goals - 12/22/20 1528       Nutrition Therapy   Diet Low Na, HH, DM diet    Drug/Food Interactions Statins/Certain Fruits    Protein (specify units) 90g    Fiber 25 grams    Whole Grain Foods 3 servings    Saturated Fats 12 max. grams    Fruits and Vegetables 8 servings/day    Sodium 1.5 grams      Personal Nutrition Goals   Nutrition Goal ST: split white rice with brown rice LT: increase whole grains    Comments 6am will wake up, gets her gradchildren around 7am. B: bowl of oatmeal with 1/2 cup OJ and apple or banana L: the other day had a fruit salad with regular gingerale (hates diet soda), lean cuisine - macoroni wilth salsbury steak S: 1/2 cup popcorn D: porkchops, greens (largest portion), and rice (white rice). S: apple and peanut butter. Drinks: water with flavoring. greens, cabbage, green beans - doesn't like other vegetables. She will not eat any other vegetables. Husband uses pam cooking spray. Uses Mrs. Dash. Her Bg will run around 102 or 106. A1C: around 6. She doesn't want to change the way she eats, but is willing to try mixing brown rice with white rice. Discussed heart healthy, diabetes friendly eatinf.      Intervention Plan   Intervention Prescribe, educate and counsel regarding individualized specific dietary modifications aiming towards targeted core components such as weight, hypertension, lipid management, diabetes, heart failure and other comorbidities.;Nutrition handout(s) given to patient.    Expected Outcomes Short Term Goal: Understand basic principles of dietary content, such as calories, fat, sodium, cholesterol and nutrients.;Long Term Goal: Adherence to prescribed nutrition plan.;Short Term Goal: A plan has been developed with personal nutrition goals set during dietitian appointment.             Nutrition Assessments:  MEDIFICTS Score Key: ?  70 Need to make dietary changes  40-70  Heart Healthy Diet ? 40 Therapeutic Level Cholesterol Diet  Flowsheet Row Cardiac Rehab from 12/22/2020 in [facility] Cardiac and Pulmonary Rehab  Picture Your Plate Total Score on Admission 50      Picture Your Plate Scores: <31 Unhealthy dietary pattern with much room for improvement. 41-50 Dietary pattern unlikely to meet recommendations for good health and room for improvement. 51-60 More healthful dietary pattern, with some room for improvement.  >60 Healthy dietary pattern, although there may be some specific behaviors that could be improved.    Nutrition Goals Re-Evaluation:   Nutrition Goals Discharge (Final Nutrition Goals Re-Evaluation):   Psychosocial: Target Goals: Acknowledge presence or absence of significant depression and/or stress, maximize coping skills, provide positive support system. Participant is able to verbalize types and ability to use techniques and skills needed for reducing stress and depression.   Education: Stress, Anxiety, and Depression - Group verbal and visual presentation to define topics covered.  Reviews how body is impacted by stress, anxiety, and depression.  Also discusses healthy ways to reduce stress and to treat/manage anxiety and depression.  Written material given at graduation. Flowsheet Row Cardiac Rehab from 02/09/2016 in [facility] Cardiac and Pulmonary Rehab  Date 12/03/15  Educator Kathreen Cornfield, [facility]  Instruction Review Code (retired) 2- meets goals/outcomes       Education: Sleep Hygiene -Provides group verbal and written instruction about how sleep can affect your health.  Define sleep hygiene, discuss sleep cycles and impact of sleep habits. Review good sleep hygiene tips.    Initial Review & Psychosocial Screening:  Initial Psych Review & Screening - 11/21/20 1430       Initial Review   Current issues with History of Depression;Current Stress Concerns      Family Dynamics   Good Support System? Yes      Barriers   Psychosocial  barriers to participate in program The patient should benefit from training in stress management and relaxation.;There are no identifiable barriers or psychosocial needs.      Screening Interventions   Interventions Encouraged to exercise;Provide feedback about the scores to participant;To provide support and resources with identified psychosocial needs    Expected Outcomes Short Term goal: Utilizing psychosocial counselor, staff and physician to assist with identification of specific Stressors or current issues interfering with healing process. Setting desired goal for each stressor or current issue identified.;Long Term Goal: Stressors or current issues are controlled or eliminated.;Short Term goal: Identification and review with participant of any Quality of Life or Depression concerns found by scoring the questionnaire.;Long Term goal: The participant improves quality of Life and PHQ9 Scores as seen by post scores and/or verbalization of changes             Quality of Life Scores:   Quality of Life - 11/27/20 1650       Quality of Life Scores   Health/Function Pre 22.65 %    Socioeconomic Pre 24.75 %    Psych/Spiritual Pre 28.29 %    Family Pre 28.8 %    GLOBAL Pre 25.36 %            Scores of 19 and below usually indicate a poorer quality of life in these areas.  A difference of  2-3 points is a clinically meaningful difference.  A difference of 2-3 points in the total score of the Quality of Life Index has been associated with significant improvement in overall quality of life, self-image, physical symptoms,  and general health in studies assessing change in quality of life.  PHQ-9: Recent Review Flowsheet Data     Depression screen [facility] 2/9 11/27/2020 11/17/2020 08/20/2020 08/01/2020 04/24/2020   Decreased Interest 0 0 0 0 0   Down, Depressed, Hopeless 0 0 0 0 0   PHQ - 2 Score 0 0 0 0 0   Altered sleeping 0 - - - -   Tired, decreased energy 0 - - - -   Change in appetite 0 -  - - -   Feeling bad or failure about yourself  0 - - - -   Trouble concentrating 0 - - - -   Moving slowly or fidgety/restless 0 - - - -   Suicidal thoughts 0 - - - -   PHQ-9 Score 0 - - - -   Difficult doing work/chores Not difficult at all - - - -      Interpretation of Total Score  Total Score Depression Severity:  1-4 = Minimal depression, 5-9 = Mild depression, 10-14 = Moderate depression, 15-19 = Moderately severe depression, 20-27 = Severe depression   Psychosocial Evaluation and Intervention:  Psychosocial Evaluation - 11/21/20 1438       Psychosocial Evaluation & Interventions   Comments Destanie is returning to Cardiac Rehab with another stent. She did some of the program in 2020. She states she is doing well and "doesn't let things stress me out." She reports to have a good support system, sleeping well, and is enjoying her life day to day. She is looking forward to coming to Cardiac Rehab to start back exercising and lose weight and hopes to get into a good routine.    Expected Outcomes Short: attend cardiac rehab for education and exercise. Long: develop and maintain positive self care habits.    Continue Psychosocial Services  Follow up required by staff             Psychosocial Re-Evaluation:  Psychosocial Re-Evaluation     [place] Name 01/01/21 1608             Psychosocial Re-Evaluation   Current issues with None Identified       Comments Patient reports no issues with their current mental states, sleep, stress, depression or anxiety. Will follow up with patient in a few weeks for any changes.       Expected Outcomes Short: Continue to exercise regularly to support mental health and notify staff of any changes. Long: maintain mental health and well being through teaching of rehab or prescribed medications independently.       Interventions Encouraged to attend Cardiac Rehabilitation for the exercise       Continue Psychosocial Services  Follow up required by  staff                Psychosocial Discharge (Final Psychosocial Re-Evaluation):  Psychosocial Re-Evaluation - 01/01/21 1608       Psychosocial Re-Evaluation   Current issues with None Identified    Comments Patient reports no issues with their current mental states, sleep, stress, depression or anxiety. Will follow up with patient in a few weeks for any changes.    Expected Outcomes Short: Continue to exercise regularly to support mental health and notify staff of any changes. Long: maintain mental health and well being through teaching of rehab or prescribed medications independently.    Interventions Encouraged to attend Cardiac Rehabilitation for the exercise    Continue Psychosocial Services  Follow up required by staff  Vocational Rehabilitation: Provide vocational rehab assistance to qualifying candidates.   Vocational Rehab Evaluation & Intervention:  Vocational Rehab - 11/21/20 1430       Initial Vocational Rehab Evaluation & Intervention   Assessment shows need for Vocational Rehabilitation No             Education: Education Goals: Education classes will be provided on a variety of topics geared toward better understanding of heart health and risk factor modification. Participant will state understanding/return demonstration of topics presented as noted by education test scores.  Learning Barriers/Preferences:  Learning Barriers/Preferences - 11/21/20 1430       Learning Barriers/Preferences   Learning Barriers Sight   glasses   Learning Preferences Individual Instruction;Verbal Instruction             General Cardiac Education Topics:  AED/CPR: - Group verbal and written instruction with the use of models to demonstrate the basic use of the AED with the basic ABC's of resuscitation.   Anatomy and Cardiac Procedures: - Group verbal and visual presentation and models provide information about basic cardiac anatomy and function.  Reviews the testing methods done to diagnose heart disease and the outcomes of the test results. Describes the treatment choices: Medical Management, Angioplasty, or Coronary Bypass Surgery for treating various heart conditions including Myocardial Infarction, Angina, Valve Disease, and Cardiac Arrhythmias.  Written material given at graduation.   Medication Safety: - Group verbal and visual instruction to review commonly prescribed medications for heart and lung disease. Reviews the medication, class of the drug, and side effects. Includes the steps to properly store meds and maintain the prescription regimen.  Written material given at graduation.   Intimacy: - Group verbal instruction through game format to discuss how heart and lung disease can affect sexual intimacy. Written material given at graduation..   Know Your Numbers and Heart Failure: - Group verbal and visual instruction to discuss disease risk factors for cardiac and pulmonary disease and treatment options.  Reviews associated critical values for Overweight/Obesity, Hypertension, Cholesterol, and Diabetes.  Discusses basics of heart failure: signs/symptoms and treatments.  Introduces Heart Failure Zone chart for action plan for heart failure.  Written material given at graduation.   Infection Prevention: - Provides verbal and written material to individual with discussion of infection control including proper hand washing and proper equipment cleaning during exercise session. Flowsheet Row Cardiac Rehab from 11/27/2020 in [facility] Cardiac and Pulmonary Rehab  Date 11/27/20  Educator [place]  Instruction Review Code 1- Verbalizes Understanding       Falls Prevention: - Provides verbal and written material to individual with discussion of falls prevention and safety. Flowsheet Row Cardiac Rehab from 11/27/2020 in [facility] Cardiac and Pulmonary Rehab  Date 11/27/20  Educator [place]  Instruction Review Code 1- Verbalizes Understanding        Other: -Provides group and verbal instruction on various topics (see comments)   Knowledge Questionnaire Score:  Knowledge Questionnaire Score - 11/27/20 1639       Knowledge Questionnaire Score   Pre Score 18/26: Heart attack, HF, Nutrition, Stress, Exercise, Nutrition             Core Components/Risk Factors/Patient Goals at Admission:  Personal Goals and Risk Factors at Admission - 11/27/20 1637       Core Components/Risk Factors/Patient Goals on Admission    Weight Management Yes;Obesity;Weight Loss    Intervention Weight Management: Develop a combined nutrition and exercise program designed to reach desired caloric intake, while maintaining appropriate intake  of nutrient and fiber, sodium and fats, and appropriate energy expenditure required for the weight goal.;Weight Management: Provide education and appropriate resources to help participant work on and attain dietary goals.;Weight Management/Obesity: Establish reasonable short term and long term weight goals.;Obesity: Provide education and appropriate resources to help participant work on and attain dietary goals.    Admit Weight 246 lb (111.6 kg)    Goal Weight: Short Term 240 lb (108.9 kg)    Goal Weight: Long Term 200 lb (90.7 kg)    Expected Outcomes Short Term: Continue to assess and modify interventions until short term weight is achieved;Long Term: Adherence to nutrition and physical activity/exercise program aimed toward attainment of established weight goal;Weight Loss: Understanding of general recommendations for a balanced deficit meal plan, which promotes 1-2 lb weight loss per week and includes a negative energy balance of [phone] kcal/d;Understanding recommendations for meals to include 15-35% energy as protein, 25-35% energy from fat, 35-60% energy from carbohydrates, less than 285m of dietary cholesterol, 20-35 gm of total fiber daily;Understanding of distribution of calorie intake throughout the day with  the consumption of 4-5 meals/snacks    Diabetes Yes    Intervention Provide education about signs/symptoms and action to take for hypo/hyperglycemia.;Provide education about proper nutrition, including hydration, and aerobic/resistive exercise prescription along with prescribed medications to achieve blood glucose in normal ranges: Fasting glucose 65-99 mg/dL    Expected Outcomes Short Term: Participant verbalizes understanding of the signs/symptoms and immediate care of hyper/hypoglycemia, proper foot care and importance of medication, aerobic/resistive exercise and nutrition plan for blood glucose control.;Long Term: Attainment of HbA1C < 7%.    Hypertension Yes    Intervention Provide education on lifestyle modifcations including regular physical activity/exercise, weight management, moderate sodium restriction and increased consumption of fresh fruit, vegetables, and low fat dairy, alcohol moderation, and smoking cessation.;Monitor prescription use compliance.    Expected Outcomes Short Term: Continued assessment and intervention until BP is < 140/980mHG in hypertensive participants. < 130/8028mG in hypertensive participants with diabetes, heart failure or chronic kidney disease.;Long Term: Maintenance of blood pressure at goal levels.    Lipids Yes    Intervention Provide education and support for participant on nutrition & aerobic/resistive exercise along with prescribed medications to achieve LDL <85m42mDL >40mg44m Expected Outcomes Short Term: Participant states understanding of desired cholesterol values and is compliant with medications prescribed. Participant is following exercise prescription and nutrition guidelines.;Long Term: Cholesterol controlled with medications as prescribed, with individualized exercise RX and with personalized nutrition plan. Value goals: LDL < 85mg,51m > 40 mg.             Education:Diabetes - Individual verbal and written instruction to review  signs/symptoms of diabetes, desired ranges of glucose level fasting, after meals and with exercise. Acknowledge that pre and post exercise glucose checks will be done for 3 sessions at entry of program. [facility]  Education need identified 11/27/20  Date 11/27/20  Educator [place]  [place] Review Code 1- Verbalizes Understanding       Core Components/Risk Factors/Patient Goals Review:   Goals and Risk Factor Review     Row Name 01/01/21 1605             Core Components/Risk Factors/Patient Goals Review   Personal Goals Review Weight Management/Obesity;Hypertension;Diabetes       Review Dora has been checking her blood pressure daily, once in the morning and once at night.  She checks her sugar in the morning before she eats. Her blood sugar has been around 90-100 before she eats. She wants to work on weight loss.       Expected Outcomes Short: lose 5 pounds in the next two weeks. Long: reach a weight goal of 200.                Core Components/Risk Factors/Patient Goals at Discharge (Final Review):   Goals and Risk Factor Review - 01/01/21 1605       Core Components/Risk Factors/Patient Goals Review   Personal Goals Review Weight Management/Obesity;Hypertension;Diabetes    Review Karlei has been checking her blood pressure daily, once in the morning and once at night. She checks her sugar in the morning before she eats. Her blood sugar has been around 90-100 before she eats. She wants to work on weight loss.    Expected Outcomes Short: lose 5 pounds in the next two weeks. Long: reach a weight goal of 200.             ITP Comments:  ITP Comments     Row Name 11/21/20 1432 11/27/20 1704 12/03/20 1546 12/17/20 0930 12/18/20 1649   ITP Comments Initial telephone orienation completed. Diagnosis can be found in [facility] 5/3. EP orientation scheduled for Thursday 5/26 at 3pm. Completed 6MWT and gym orientation.  Initial ITP created and sent for review to Dr. Emily Filbert, Medical Director. First full day of exercise!  Patient was oriented to gym and equipment including functions, settings, policies, and procedures.  Patient's individual exercise prescription and treatment plan were reviewed.  All starting workloads were established based on the results of the 6 minute walk test done at initial orientation visit.  The plan for exercise progression was also introduced and progression will be customized based on patient's performance and goals. 30 Day review completed. Medical Director ITP review done, changes made as directed, and signed approval by Medical Director.  2 visits in June Karys reports she will be getting her left knee replaced next month. Before then we will need to discharge her and when she is ready to return she can get a new referral to come back to rehab. Until then she will continue to come to rehab. She will know by the end of June what the date is.    [place] Name 01/14/21 1208           ITP Comments 30 Day review completed. Medical Director ITP review done, changes made as directed, and signed approval by Medical Director.                Comments:

## 2021-01-15 ENCOUNTER — Encounter: Payer: Self-pay | Admitting: Advanced Practice Midwife

## 2021-01-15 NOTE — Progress Notes (Signed)
Gynecology Annual Exam  PCP: Lavera Guise, MD  Chief Complaint:  Chief Complaint  Patient presents with   Annual Exam    History of Present Illness:Patient is a 65 y.o. G3 P49 female presents for annual exam. The patient has no gyn or breast complaints today. Her mammogram has been ordered by her PCP.   LMP: No LMP recorded. Patient has had a hysterectomy.  The patient is sexually active. She denies dyspareunia.  The patient does perform self breast exams.  There is notable family history of breast or ovarian cancer in her family. Her mother had breast and ovarian cancer at age 95. She is interested in [facility]. Myriad information given to patient and she will let us know if she wants testing.   The patient wears seatbelts: yes.   The patient has regular exercise: she is currently in cardiac rehab per cardiologist, she is on a low salt, low cholesterol diet, she admits adequate hydration and sleep.  The patient denies current symptoms of depression.     Review of Systems: Review of Systems  Constitutional:  Negative for chills and fever.  HENT:  Negative for congestion, ear discharge, ear pain, hearing loss, sinus pain and sore throat.   Eyes:  Negative for blurred vision and double vision.  Respiratory:  Negative for cough, shortness of breath and wheezing.   Cardiovascular:  Negative for chest pain, palpitations and leg swelling.  Gastrointestinal:  Negative for abdominal pain, blood in stool, constipation, diarrhea, heartburn, melena, nausea and vomiting.  Genitourinary:  Positive for urgency. Negative for dysuria, flank pain, frequency and hematuria.  Musculoskeletal:  Positive for joint pain. Negative for back pain and myalgias.  Skin:  Negative for itching and rash.  Neurological:  Positive for headaches. Negative for dizziness, tingling, tremors, sensory change, speech change, focal weakness, seizures, loss of consciousness and weakness.  Endo/Heme/Allergies:  Negative  for environmental allergies. Does not bruise/bleed easily.  Psychiatric/Behavioral:  Negative for depression, hallucinations, memory loss, substance abuse and suicidal ideas. The patient is not nervous/anxious and does not have insomnia.    Past Medical History:  Patient Active Problem List   Diagnosis Date Noted   Stable angina ([place]) 10/29/2020   Acute non-recurrent maxillary sinusitis 08/01/2020   Cough 08/01/2020   Pain in joint of left knee 07/17/2020   Uncontrolled type 2 diabetes mellitus with hyperglycemia ([place]) 03/25/2020   Neck pain 03/25/2020   Other insomnia 03/25/2020   BMI 39.0-39.9,adult 03/25/2020   Atherosclerosis of aorta ([place]) 03/19/2020   Pain in right knee 03/04/2020   Lumbar radiculopathy 03/04/2020   Unstable angina ([facility]) 02/13/2019   Acute bilateral low back pain without sciatica 08/21/2018   Asthma 08/21/2018   Coronary artery disease involving coronary bypass graft of native heart without angina pectoris 08/21/2018   Depression, major, single episode, in partial remission ([place]) 08/21/2018   Chest pain 11/02/2017   S/P drug eluting coronary stent placement 02/16/2017   Numbness and tingling 12/01/2016   Chronic tension-type headache, intractable 12/01/2016   Osteoarthritis of knee 11/12/2016   Obesity, Class II, BMI 35-39.9 05/13/2016   Hx of CABG 2010 11/12/2015    Formatting of this note might be different from the original. Last Assessment & Plan:  Marland Kitchen     Type 2 diabetes mellitus with circulatory disorder, without long-term current use of insulin ([place]) 11/12/2015   Morbid obesity ([place]) 11/12/2015    BMI 40     SOBOE (shortness of breath on exertion) 11/12/2015  RBBB 11/12/2015   ST elevation myocardial infarction (STEMI) of inferior wall ([place]) 11/03/2015    Overview:  Due to occuded rca graft now opened with stent 10/2015     SVG-RCA throbectomy and DES 10/18/15    Hypertensive heart disease without heart failure    Vertigo    STEMI 10/18/15  10/18/2015   NSTEMI (non-ST elevated myocardial infarction) ([place]) 10/18/2015    Formatting of this note might be different from the original. Due to occuded rca graft now opened with stent 10/2015 and graft to D1 2020 Formatting of this note might be different from the original. Last Assessment & Plan:  .     Intractable chronic migraine without aura and without status migrainosus 05/08/2015   Essential hypertension 03/17/2015   Acute on chronic diastolic CHF (congestive heart failure), NYHA class 3 ([place]) 08/26/2014   CAD (coronary artery disease) 08/26/2014    Overview:  Sp cabg 2010 with svg to rca and om2 and lima to lad  Sp pci and stent of rca and om2 graft 2018  Formatting of this note might be different from the original. Sp cabg 2010 with svg to rca and om2 and lima to lad  Sp pci and stent of rca and om2 graft 2018 Overview:  Sp cabg 2010 with svg to rca and om2 and lima to lad  Sp pci and stent of rca and om2 graft 2018 Formatting of this note might be different from the original. Sp cabg 2010 with svg to rca and D1 om2 and lima to lad  Sp pci and stent of rca and om2 graft 2018 Occluded svg to rca and stent in om1 graft ostium 03/2019 occlusion of graft to D1 06/2019     Headache 08/06/2014   Mixed hyperlipidemia 07/30/2014    Past Surgical History:  Past Surgical History:  Procedure Laterality Date   ABDOMINAL HYSTERECTOMY     CARDIAC CATHETERIZATION     CARDIAC CATHETERIZATION N/A 10/18/2015   Procedure: Left Heart Cath and Cors/Grafts Angiography;  Surgeon: Lorretta Harp, MD;  Location: [place] [facility];  Service: Cardiovascular;  Laterality: N/A;   CARDIAC CATHETERIZATION N/A 10/18/2015   Procedure: Coronary Stent Intervention;  Surgeon: Lorretta Harp, MD;  Location: [place] [facility];  Service: Cardiovascular;  Laterality: N/A;   CARDIAC SURGERY     CHOLECYSTECTOMY     CORONARY ANGIOPLASTY     CORONARY ARTERY BYPASS GRAFT     4 vessels - 2010   CORONARY  STENT INTERVENTION N/A 02/16/2017   Procedure: CORONARY STENT INTERVENTION;  Surgeon: Yolonda Kida, MD;  Location: [place] [facility];  Service: Cardiovascular;  Laterality: N/A;   CORONARY STENT INTERVENTION N/A 02/13/2019   Procedure: CORONARY STENT INTERVENTION;  Surgeon: Nelva Bush, MD;  Location: [place] [facility];  Service: Cardiovascular;  Laterality: N/A;  SVG to RCA   CORONARY STENT INTERVENTION Left 03/08/2019   Procedure: CORONARY STENT INTERVENTION;  Surgeon: Yolonda Kida, MD;  Location: [place] [facility];  Service: Cardiovascular;  Laterality: Left;   CORONARY STENT INTERVENTION N/A 11/04/2020   Procedure: CORONARY STENT INTERVENTION;  Surgeon: Nelva Bush, MD;  Location: [place] [facility];  Service: Cardiovascular;  Laterality: N/A;   LEFT HEART CATH AND CORONARY ANGIOGRAPHY Left 02/16/2017   Procedure: LEFT HEART CATH AND CORONARY ANGIOGRAPHY;  Surgeon: Corey Skains, MD;  Location: [place] [facility];  Service: Cardiovascular;  Laterality: Left;   LEFT HEART CATH AND CORS/GRAFTS ANGIOGRAPHY Left 11/23/2017  Procedure: LEFT HEART CATH AND CORS/GRAFTS ANGIOGRAPHY;  Surgeon: Corey Skains, MD;  Location: [place] [facility];  Service: Cardiovascular;  Laterality: Left;   LEFT HEART CATH AND CORS/GRAFTS ANGIOGRAPHY N/A 02/13/2019   Procedure: LEFT HEART CATH AND CORS/GRAFTS ANGIOGRAPHY;  Surgeon: Corey Skains, MD;  Location: [place] [facility];  Service: Cardiovascular;  Laterality: N/A;   LEFT HEART CATH AND CORS/GRAFTS ANGIOGRAPHY N/A 07/03/2019   Procedure: LEFT HEART CATH AND CORS/GRAFTS ANGIOGRAPHY;  Surgeon: Corey Skains, MD;  Location: [place] [facility];  Service: Cardiovascular;  Laterality: N/A;   LEFT HEART CATH AND CORS/GRAFTS ANGIOGRAPHY N/A 11/04/2020   Procedure: LEFT HEART CATH AND CORS/GRAFTS ANGIOGRAPHY;  Surgeon: Corey Skains, MD;  Location: [place] [facility];  Service: Cardiovascular;  Laterality: N/A;     Gynecologic History:  No LMP recorded. Patient has had a hysterectomy. Last Pap: discontinued secondary to hysterectomy Last mammogram: 2020 Results were:  normal per patient report  Obstetric History: G3 P3003  Family History:  Family History  Problem Relation Age of Onset   Diabetes Mother    Diabetes Father    Cancer Father    Diabetes Brother     Social History:  Social History   Socioeconomic History   Marital status: Single    Spouse name: Biochemist, clinical   Number of children: Not on file   Years of education: Not on file   Highest education level: Not on file  Occupational History   Not on file  Tobacco Use   Smoking status: Never   Smokeless tobacco: Never  Vaping Use   Vaping Use: Never used  Substance and Sexual Activity   Alcohol use: No    Alcohol/week: 0.0 standard drinks   Drug use: No   Sexual activity: Yes  Other Topics Concern   Not on file  Social History Narrative   Not on file   Social Determinants of Health   Financial Resource Strain: Not on file  Food Insecurity: Not on file  Transportation Needs: Not on file  Physical Activity: Not on file  Stress: Not on file  Social Connections: Not on file  Intimate Partner Violence: Not on file    Allergies:  Allergies  Allergen Reactions   Penicillin G Anaphylaxis and Other (See Comments)    Has patient had a PCN reaction causing immediate rash, facial/tongue/throat swelling, SOB or lightheadedness with hypotension: [id number]} Has patient had a PCN reaction causing severe rash involving mucus membranes or skin necrosis: no:[id number]} Has patient had a PCN reaction that required hospitalization no:[id number]} Has patient had a PCN reaction occurring within the last 10 years: no:[id number]} If all of the above answers are "NO", then may proceed with Cephalosporin use.    Medications: Prior to Admission medications   Medication Sig Start Date End Date Taking? Authorizing Provider  Accu-Chek  Softclix Lancets lancets Use as instructed twice a day to check blood sugars  DIAG -E11.59 08/20/20  Yes Luiz Ochoa, NP  albuterol (PROVENTIL HFA;VENTOLIN HFA) 108 (90 Base) MCG/ACT inhaler Inhale 2 puffs into the lungs every 6 (six) hours as needed for wheezing or shortness of breath.   Yes [provider]  amLODipine (NORVASC) 10 MG tablet Take 1 tablet (10 mg total) by mouth daily. 08/20/20  Yes Luiz Ochoa, NP  aspirin EC 81 MG tablet Take 81 mg by mouth daily.   Yes [provider]  atorvastatin (LIPITOR) 80 MG tablet Take 1 tablet (80 mg total) by mouth  daily. 11/05/20 11/05/21 Yes Corey Skains, MD  bisoprolol-hydrochlorothiazide [facility]) 5-6.25 MG tablet Take 1 tablet by mouth daily. 02/04/20  Yes Lavera Guise, MD  canagliflozin [facility]) 300 MG TABS tablet Take 1 tablet (300 mg total) by mouth daily before breakfast. 11/17/20  Yes McDonough, Lauren K, PA-C  clopidogrel (PLAVIX) 75 MG tablet Take 1 tablet (75 mg total) by mouth daily. 03/06/20  Yes Boscia, Greer Ee, NP  cyclobenzaprine (FLEXERIL) 10 MG tablet Take 1 tablet (10 mg total) by mouth at bedtime. 12/16/20  Yes McDonough, Si Gaul, PA-C  Dulaglutide (TRULICITY) 1.5 PI/9.5JO SOPN INJECT 1.$RemoveBefor'5MG'FBqbmdzClqbp$  INTO THE SKIN ONCE A WEEK 12/19/20  Yes Lavera Guise, MD  famotidine (PEPCID) 20 MG tablet Take 1 tablet (20 mg total) by mouth 2 (two) times daily. 11/17/20  Yes McDonough, Lauren K, PA-C  furosemide (LASIX) 40 MG tablet Take 1 tablet (40 mg total) by mouth 2 (two) times daily. 08/20/20  Yes Luiz Ochoa, NP  glucose blood (ACCU-CHEK GUIDE) test strip Use as instructed to check blood sugars twice a day  E11.59 08/20/20  Yes Luiz Ochoa, NP  isosorbide mononitrate (IMDUR) 60 MG 24 hr tablet Take 60 mg by mouth daily.   Yes [provider]  losartan (COZAAR) 100 MG tablet Take 1 tablet (100 mg total) by mouth daily. Take one tab po qd 08/20/20  Yes Luiz Ochoa, NP  metoprolol succinate (TOPROL-XL) 25 MG 24  hr tablet Take 25 mg by mouth daily.   Yes [provider]  montelukast (SINGULAIR) 10 MG tablet Take 1 tablet (10 mg total) by mouth at bedtime. 02/04/20  Yes Lavera Guise, MD  nitroGLYCERIN (NITROSTAT) 0.4 MG SL tablet Place 1 tablet (0.4 mg total) under the tongue every 5 (five) minutes x 3 doses as needed for chest pain. *If no relief call MD or go to Emergency Room* 12/19/20  Yes Lavera Guise, MD  oxybutynin (DITROPAN-XL) 5 MG 24 hr tablet Take 1 tablet (5 mg total) by mouth daily with supper. 02/04/20  Yes Lavera Guise, MD  zolpidem (AMBIEN) 10 MG tablet Take 1 tablet (10 mg total) by mouth at bedtime as needed for sleep. 01/14/21  Yes McDonough, Si Gaul, PA-C    Physical Exam Vitals: Blood pressure 120/80, height $RemoveBefore'5\' 7"'vhVAKKSPHRCfA$  (1.702 m), weight 245 lb (111.1 kg).  General: NAD HEENT: normocephalic, anicteric Thyroid: no enlargement, no palpable nodules Pulmonary: No increased work of breathing, CTAB Cardiovascular: RRR, distal pulses 2+ Breast: Breast symmetrical, no tenderness, no palpable nodules or masses, no skin or nipple retraction present, no nipple discharge.  No axillary or supraclavicular lymphadenopathy. Abdomen: NABS, soft, non-tender, non-distended.  Umbilicus without lesions.  No hepatomegaly, splenomegaly or masses palpable. No evidence of hernia  Genitourinary: deferred for no concerns/discontinued PAP Extremities: no edema, erythema, or tenderness Neurologic: Grossly intact Psychiatric: mood appropriate, affect full    Assessment: 65 y.o. G3 P3003 female, routine annual exam  Plan: Problem List Items Addressed This Visit   None Visit Diagnoses     Well woman exam without gynecological exam    -  Primary       1) Mammogram - recommend yearly screening mammogram.  Mammogram  was ordered by PCP  2) STI screening  wasoffered and declined  3) ASCCP guidelines and rationale discussed.  Patient opts for discontinue secondary to prior hysterectomy screening  interval  4) Osteoporosis  - per USPTF routine screening DEXA at age 39  Consider FDA-approved medical therapies  in postmenopausal women and men aged 23 years and older, based on the following: a) A hip or vertebral (clinical or morphometric) fracture b) T-score ? -2.5 at the femoral neck or spine after appropriate evaluation to exclude secondary causes C) Low bone mass (T-score between -1.0 and -2.5 at the femoral neck or spine) and a 10-year probability of a hip fracture ? 3% or a 10-year probability of a major osteoporosis-related fracture ? 20% based on the [place]-adapted WHO algorithm   5) Routine healthcare maintenance including cholesterol, diabetes screening discussed managed by PCP  6) Colonoscopy to be ordered by PCP.  Screening recommended starting at age 84 for average risk individuals, age 26 for individuals deemed at increased risk (including African Americans) and recommended to continue until age 17.  For patient age 23-85 individualized approach is recommended.  Gold standard screening is via colonoscopy, Cologuard screening is an acceptable alternative for patient unwilling or unable to undergo colonoscopy.  "Colorectal cancer screening for average?risk adults: 2018 guideline update from the American Cancer Society"CA: A Cancer Journal for Clinicians: Dec 01, 2016   7) Return in about 1 year (around 01/14/2022) for annual established gyn.    Christean Leaf, CNM [facility] 01/15/21, 1:23 PM

## 2021-01-19 ENCOUNTER — Other Ambulatory Visit: Payer: Self-pay

## 2021-01-19 ENCOUNTER — Other Ambulatory Visit: Payer: Self-pay | Admitting: Internal Medicine

## 2021-01-19 DIAGNOSIS — I10 Essential (primary) hypertension: Secondary | ICD-10-CM

## 2021-01-19 NOTE — Telephone Encounter (Signed)
I WILL NEED MED LIST, THERE MIGHT BE DUPLICATE THERAPY

## 2021-01-22 ENCOUNTER — Telehealth: Payer: Self-pay

## 2021-01-22 NOTE — Telephone Encounter (Signed)
Knee been bothering her still - plans to attend next week - getting injection tomorrow

## 2021-01-23 ENCOUNTER — Telehealth: Payer: Self-pay

## 2021-01-23 NOTE — Telephone Encounter (Signed)
Called and spoke to pt and asked if she was taking Metoprolol succinate and bisoprolol-HCTZ will call back and leave a message or call back on Monday after 830 am

## 2021-01-26 ENCOUNTER — Other Ambulatory Visit: Payer: Self-pay

## 2021-01-26 ENCOUNTER — Telehealth: Payer: Self-pay

## 2021-01-26 DIAGNOSIS — Z955 Presence of coronary angioplasty implant and graft: Secondary | ICD-10-CM

## 2021-01-26 DIAGNOSIS — Z48812 Encounter for surgical aftercare following surgery on the circulatory system: Secondary | ICD-10-CM | POA: Diagnosis not present

## 2021-01-26 MED ORDER — CYCLOBENZAPRINE HCL 10 MG PO TABS
10.0000 mg | ORAL_TABLET | Freq: Every day | ORAL | 3 refills | Status: DC
Start: 2021-01-26 — End: 2021-02-19

## 2021-01-26 NOTE — Telephone Encounter (Signed)
Per DFK I had pt stop taking the Bisoprolol-HCTZ and to start taking 2 tablets of the Metoprolol 25 mg.  I informed pt of the changes and will discuss further at her Aug 2022 appt with Ander Purpura

## 2021-01-26 NOTE — Progress Notes (Signed)
Daily Session Note  Patient Details  Name: Felicia Woods MRN: [id number] Date of Birth: [birth year] Referring Provider:   Flowsheet Row Cardiac Rehab from 11/27/2020 in [facility] Cardiac and Pulmonary Rehab  Referring Provider Serafina Royals MD       Encounter Date: 01/26/2021  Check In:  Session Check In - 01/26/21 1555       Check-In   Supervising physician immediately available to respond to emergencies See telemetry face sheet for immediately available ER MD    Location [facility]-Cardiac & Pulmonary Rehab    Staff Present Birdie Sons, MPA, Mauricia Area, BS, ACSM CEP, Exercise Physiologist;Amanda Oletta Darter, BA, ACSM CEP, Exercise Physiologist;Joseph [place]land, Cristopher Estimable, RN BSN    Virtual Visit No    Medication changes reported     Yes    Comments no longer taking bisoprolo hydrochlorathiazide; taking metoprolol twice a day    Fall or balance concerns reported    No    Warm-up and Cool-down Performed on first and last piece of equipment    Resistance Training Performed Yes    VAD Patient? No    PAD/SET Patient? No      Pain Assessment   Currently in Pain? No/denies                Social History   Tobacco Use  Smoking Status Never  Smokeless Tobacco Never    Goals Met:  Independence with exercise equipment Exercise tolerated well No report of cardiac concerns or symptoms Strength training completed today  Goals Unmet:  Not Applicable  Comments: Pt able to follow exercise prescription today without complaint.  Will continue to monitor for progression.    Dr. Emily Filbert is Medical Director for [place].  Dr. Ottie Glazier is Medical Director for [facility] Pulmonary Rehabilitation.

## 2021-01-27 NOTE — Telephone Encounter (Signed)
Called and spoke to pt and asked if she was taking Metoprolol succinate and bisoprolol-HCTZ will call back and leave a message or call back on Monday after 830 am

## 2021-01-28 ENCOUNTER — Other Ambulatory Visit: Payer: Self-pay

## 2021-01-28 ENCOUNTER — Encounter: Payer: Medicare Other | Admitting: *Deleted

## 2021-01-28 DIAGNOSIS — Z48812 Encounter for surgical aftercare following surgery on the circulatory system: Secondary | ICD-10-CM | POA: Diagnosis not present

## 2021-01-28 DIAGNOSIS — Z955 Presence of coronary angioplasty implant and graft: Secondary | ICD-10-CM

## 2021-01-28 NOTE — Progress Notes (Signed)
Daily Session Note  Patient Details  Name: Felicia Woods MRN: [id number] Date of Birth: June 13, 1956 Referring Provider:   Flowsheet Row Cardiac Rehab from 11/27/2020 in [facility] Cardiac and Pulmonary Rehab  Referring Provider Serafina Royals MD       Encounter Date: 01/28/2021  Check In:  Session Check In - 01/28/21 1539       Check-In   Supervising physician immediately available to respond to emergencies See telemetry face sheet for immediately available ER MD    Location [facility]-Cardiac & Pulmonary Rehab    Staff Present Renita Papa, RN BSN;Joseph [place], RCP,RRT,BSRT;Kara Longoria, [place], ASCM CEP, Exercise Physiologist    Virtual Visit No    Medication changes reported     No    Fall or balance concerns reported    No    Warm-up and Cool-down Performed on first and last piece of equipment    Resistance Training Performed Yes    VAD Patient? No    PAD/SET Patient? No      Pain Assessment   Currently in Pain? No/denies                Social History   Tobacco Use  Smoking Status Never  Smokeless Tobacco Never    Goals Met:  Independence with exercise equipment Exercise tolerated well No report of cardiac concerns or symptoms Strength training completed today  Goals Unmet:  Not Applicable  Comments: Pt able to follow exercise prescription today without complaint.  Will continue to monitor for progression.    Dr. Emily Filbert is Medical Director for [place].  Dr. Ottie Glazier is Medical Director for [facility] Pulmonary Rehabilitation.

## 2021-02-04 ENCOUNTER — Encounter: Payer: Medicare Other | Attending: Internal Medicine | Admitting: *Deleted

## 2021-02-04 ENCOUNTER — Other Ambulatory Visit: Payer: Self-pay

## 2021-02-04 DIAGNOSIS — Z955 Presence of coronary angioplasty implant and graft: Secondary | ICD-10-CM | POA: Insufficient documentation

## 2021-02-04 NOTE — Progress Notes (Signed)
Daily Session Note  Patient Details  Name: Felicia Woods MRN: [id number] Date of Birth: 1956-03-11 Referring Provider:   Flowsheet Row Cardiac Rehab from 11/27/2020 in [facility] Cardiac and Pulmonary Rehab  Referring Provider Serafina Royals MD       Encounter Date: 02/04/2021  Check In:  Session Check In - 02/04/21 1620       Check-In   Supervising physician immediately available to respond to emergencies See telemetry face sheet for immediately available ER MD    Location [facility]-Cardiac & Pulmonary Rehab    Staff Present Renita Papa, RN BSN;Melissa La Jara, RDN, Tawanna Solo, MS, ASCM CEP, Exercise Physiologist    Virtual Visit No    Medication changes reported     No    Fall or balance concerns reported    No    Warm-up and Cool-down Performed on first and last piece of equipment    Resistance Training Performed Yes    VAD Patient? No    PAD/SET Patient? No      Pain Assessment   Currently in Pain? No/denies                Social History   Tobacco Use  Smoking Status Never  Smokeless Tobacco Never    Goals Met:  Independence with exercise equipment Exercise tolerated well No report of cardiac concerns or symptoms Strength training completed today  Goals Unmet:  Not Applicable  Comments: Pt able to follow exercise prescription today without complaint.  Will continue to monitor for progression.    Dr. Emily Filbert is Medical Director for [place].  Dr. Ottie Glazier is Medical Director for [facility] Pulmonary Rehabilitation.

## 2021-02-09 ENCOUNTER — Other Ambulatory Visit: Payer: Self-pay

## 2021-02-09 DIAGNOSIS — Z955 Presence of coronary angioplasty implant and graft: Secondary | ICD-10-CM

## 2021-02-09 NOTE — Progress Notes (Signed)
Daily Session Note  Patient Details  Name: Felicia Woods MRN: [id number] Date of Birth: 20-Mar-1956 Referring Provider:   Flowsheet Row Cardiac Rehab from 11/27/2020 in [facility] Cardiac and Pulmonary Rehab  Referring Provider Serafina Royals MD       Encounter Date: 02/09/2021  Check In:  Session Check In - 02/09/21 1538       Check-In   Supervising physician immediately available to respond to emergencies See telemetry face sheet for immediately available ER MD    Location [facility]-Cardiac & Pulmonary Rehab    Staff Present Birdie Sons, MPA, Nino Glow, MS, ASCM CEP, Exercise Physiologist;Laureen Owens Shark, BS, RRT, CPFT    Virtual Visit No    Medication changes reported     No    Fall or balance concerns reported    No    Warm-up and Cool-down Performed on first and last piece of equipment    Resistance Training Performed Yes    VAD Patient? No    PAD/SET Patient? No      Pain Assessment   Currently in Pain? No/denies                Social History   Tobacco Use  Smoking Status Never  Smokeless Tobacco Never    Goals Met:  Independence with exercise equipment Exercise tolerated well No report of cardiac concerns or symptoms Strength training completed today  Goals Unmet:  Not Applicable  Comments: Pt able to follow exercise prescription today without complaint.  Will continue to monitor for progression.    Dr. Emily Filbert is Medical Director for [place].  Dr. Ottie Glazier is Medical Director for [facility] Pulmonary Rehabilitation.

## 2021-02-11 ENCOUNTER — Encounter: Payer: Self-pay | Admitting: *Deleted

## 2021-02-11 DIAGNOSIS — Z955 Presence of coronary angioplasty implant and graft: Secondary | ICD-10-CM

## 2021-02-11 NOTE — Progress Notes (Signed)
Cardiac Individual Treatment Plan  Patient Details  Name: Felicia Woods MRN: [id number] Date of Birth: 03-Feb-1956 Referring Provider:   Flowsheet Row Cardiac Rehab from 11/27/2020 in [facility] Cardiac and Pulmonary Rehab  Referring Provider Serafina Royals MD       Initial Encounter Date:  Flowsheet Row Cardiac Rehab from 11/27/2020 in [facility] Cardiac and Pulmonary Rehab  Date 11/27/20       Visit Diagnosis: S/P coronary artery stent placement  Patient's Home Medications on Admission:  Current Outpatient Medications:    Accu-Chek Softclix Lancets lancets, Use as instructed twice a day to check blood sugars  DIAG -E11.59, Disp: 100 each, Rfl: 3   albuterol (PROVENTIL HFA;VENTOLIN HFA) 108 (90 Base) MCG/ACT inhaler, Inhale 2 puffs into the lungs every 6 (six) hours as needed for wheezing or shortness of breath., Disp: , Rfl:    amLODipine (NORVASC) 10 MG tablet, Take 1 tablet (10 mg total) by mouth daily., Disp: 90 tablet, Rfl: 1   aspirin EC 81 MG tablet, Take 81 mg by mouth daily., Disp: , Rfl:    atorvastatin (LIPITOR) 80 MG tablet, Take 1 tablet (80 mg total) by mouth daily., Disp: 30 tablet, Rfl: 2   bisoprolol-hydrochlorothiazide (ZIAC) 5-6.25 MG tablet, Take 1 tablet by mouth once daily, Disp: 90 tablet, Rfl: 1   canagliflozin (INVOKANA) 300 MG TABS tablet, Take 1 tablet (300 mg total) by mouth daily before breakfast., Disp: 30 tablet, Rfl: 2   clopidogrel (PLAVIX) 75 MG tablet, Take 1 tablet (75 mg total) by mouth daily., Disp: 90 tablet, Rfl: 3   cyclobenzaprine (FLEXERIL) 10 MG tablet, Take 1 tablet (10 mg total) by mouth at bedtime., Disp: 30 tablet, Rfl: 3   dapagliflozin propanediol (FARXIGA) 10 MG TABS tablet, Farxiga 10 mg tablet, Disp: , Rfl:    Dulaglutide (TRULICITY) 1.5 KC/1.2XN SOPN, INJECT 1.5MG INTO THE SKIN ONCE A WEEK, Disp: 12 mL, Rfl: 1   erythromycin ophthalmic ointment, erythromycin 5 mg/gram (0.5 %) eye ointment, Disp: , Rfl:    famotidine (PEPCID) 20 MG tablet,  Take 1 tablet (20 mg total) by mouth 2 (two) times daily., Disp: 60 tablet, Rfl: 2   furosemide (LASIX) 40 MG tablet, Take 1 tablet (40 mg total) by mouth 2 (two) times daily., Disp: 180 tablet, Rfl: 1   glucose blood (ACCU-CHEK GUIDE) test strip, Use as instructed to check blood sugars twice a day  E11.59, Disp: 100 each, Rfl: 3   isosorbide mononitrate (IMDUR) 60 MG 24 hr tablet, Take 60 mg by mouth daily., Disp: , Rfl:    losartan (COZAAR) 100 MG tablet, Take 1 tablet (100 mg total) by mouth daily. Take one tab po qd, Disp: 90 tablet, Rfl: 1   methocarbamol (ROBAXIN) 500 MG tablet, methocarbamol 500 mg tablet  TAKE 1 TABLET BY MOUTH EVERY 6 HOURS AS NEEDED, Disp: , Rfl:    metoprolol succinate (TOPROL-XL) 25 MG 24 hr tablet, Take 25 mg by mouth daily., Disp: , Rfl:    montelukast (SINGULAIR) 10 MG tablet, Take 1 tablet (10 mg total) by mouth at bedtime., Disp: 90 tablet, Rfl: 3   neomycin-polymyxin-dexamethasone (MAXITROL) 0.1 % ophthalmic suspension, neomycin-polymyxin-dexameth 3.5 mg/mL-10,000 unit/mL-0.1% eye drops  INSTILL 1 DROP INTO EACH EYE 4 TIMES DAILY FOR 7 DAYS, Disp: , Rfl:    nitroGLYCERIN (NITROSTAT) 0.4 MG SL tablet, Place 1 tablet (0.4 mg total) under the tongue every 5 (five) minutes x 3 doses as needed for chest pain. *If no relief call MD or go to Emergency  Room*, Disp: 30 tablet, Rfl: 0   nitroGLYCERIN (NITROSTAT) 0.4 MG SL tablet, Place under the tongue., Disp: , Rfl:    oxybutynin (DITROPAN-XL) 5 MG 24 hr tablet, Take 1 tablet (5 mg total) by mouth daily with supper., Disp: 90 tablet, Rfl: 2   potassium chloride SA (KLOR-CON) 20 MEQ tablet, potassium chloride ER 20 mEq tablet,extended release(part/cryst)  TAKE 1 TABLET BY MOUTH ONCE DAILY FOR 5 DAYS, Disp: , Rfl:    potassium chloride SA (KLOR-CON) 20 MEQ tablet, Take 20 mEq by mouth daily., Disp: , Rfl:    ranolazine (RANEXA) 500 MG 12 hr tablet, Take 1 tablet by mouth 2 (two) times daily., Disp: , Rfl:     trimethoprim-polymyxin b (POLYTRIM) ophthalmic solution, polymyxin B sulfate 10,000 unit-trimethoprim 1 mg/mL eye drops  INSTILL 1 DROP INTO EACH EYE 4 TIMES DAILY FOR 7 DAYS, Disp: , Rfl:    zolpidem (AMBIEN) 10 MG tablet, Take 1 tablet (10 mg total) by mouth at bedtime as needed for sleep., Disp: 30 tablet, Rfl: 0  Past Medical History: Past Medical History:  Diagnosis Date   Asthma    Coronary artery disease    Diabetes mellitus without complication ([place])    Heart attack ([place])    Hyperlipidemia    Hypertension    MI (myocardial infarction) ([place])    Migraine headache with aura    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    Tobacco Use: Social History   Tobacco Use  Smoking Status Never  Smokeless Tobacco Never    Labs: Recent Review Flowsheet Data     Labs for ITP Cardiac and Pulmonary Rehab Latest Ref Rng & Units 09/17/2019 11/13/2019 03/06/2020 08/20/2020 11/17/2020   Cholestrol 100 - 199 mg/dL 188 - - - -   LDLCALC 0 - 99 mg/dL 126(H) - - - -   HDL >39 mg/dL 38(L) - - - -   Trlycerides 0 - 149 mg/dL 133 - - - -   Hemoglobin A1c 4.0 - 5.6 % - 6.8(A) 6.9(A) 7.6(A) 7.5(A)   TCO2 0 - 100 mmol/L - - - - -        Exercise Target Goals: Exercise Program Goal: Individual exercise prescription set using results from initial 6 min walk test and THRR while considering  patient's activity barriers and safety.   Exercise Prescription Goal: Initial exercise prescription builds to 30-45 minutes a day of aerobic activity, 2-3 days per week.  Home exercise guidelines will be given to patient during program as part of exercise prescription that the participant will acknowledge.   Education: Aerobic Exercise: - Group verbal and visual presentation on the components of exercise prescription. Introduces F.I.T.T principle from ACSM for exercise prescriptions.  Reviews F.I.T.T. principles of aerobic exercise including progression. Written material given at graduation.   Education: Resistance  Exercise: - Group verbal and visual presentation on the components of exercise prescription. Introduces F.I.T.T principle from ACSM for exercise prescriptions  Reviews F.I.T.T. principles of resistance exercise including progression. Written material given at graduation.    Education: Exercise & Equipment Safety: - Individual verbal instruction and demonstration of equipment use and safety with use of the equipment. Flowsheet Row Cardiac Rehab from 11/27/2020 in [facility] Cardiac and Pulmonary Rehab  Date 11/27/20  Educator [place]  Instruction Review Code 1- Verbalizes Understanding       Education: Exercise Physiology & General Exercise Guidelines: - Group verbal and written instruction with models to review the exercise physiology of the cardiovascular system and associated  critical values. Provides general exercise guidelines with specific guidelines to those with heart or lung disease.    Education: Flexibility, Balance, Mind/Body Relaxation: - Group verbal and visual presentation with interactive activity on the components of exercise prescription. Introduces F.I.T.T principle from ACSM for exercise prescriptions. Reviews F.I.T.T. principles of flexibility and balance exercise training including progression. Also discusses the mind body connection.  Reviews various relaxation techniques to help reduce and manage stress (i.e. Deep breathing, progressive muscle relaxation, and visualization). Balance handout provided to take home. Written material given at graduation.   Activity Barriers & Risk Stratification:  Activity Barriers & Cardiac Risk Stratification - 11/27/20 1629       Activity Barriers & Cardiac Risk Stratification   Activity Barriers Back Problems;Joint Problems;Arthritis;Deconditioning;Muscular Weakness    Cardiac Risk Stratification High             6 Minute Walk:  6 Minute Walk     Row Name 11/27/20 1633         6 Minute Walk   Phase Initial     Distance 470 feet      Walk Time 6 minutes     # of Rest Breaks 0     MPH 0.89     METS 0.95     RPE 13     Perceived Dyspnea  0     VO2 Peak 3.35     Symptoms Yes (comment)     Comments Right knee- arthritis pain 10/10     Resting HR 66 bpm     Resting BP 104/62     Resting Oxygen Saturation  96 %     Exercise Oxygen Saturation  during 6 min walk 95 %     Max Ex. HR 89 bpm     Max Ex. BP 122/62     2 Minute Post BP 118/64              Oxygen Initial Assessment:   Oxygen Re-Evaluation:   Oxygen Discharge (Final Oxygen Re-Evaluation):   Initial Exercise Prescription:  Initial Exercise Prescription - 11/27/20 1600       Date of Initial Exercise RX and Referring Provider   Date 11/27/20    Referring Provider Serafina Royals MD      Recumbant Bike   Level 1    RPM 60    Watts 15    Minutes 15    METs 1      NuStep   Level 1    SPM 80    Minutes 15    METs 1      Track   Laps 10    Minutes 15    METs 1      Prescription Details   Frequency (times per week) 3    Duration Progress to 30 minutes of continuous aerobic without signs/symptoms of physical distress      Intensity   THRR 40-80% of Max Heartrate 102-138    Ratings of Perceived Exertion 11-13    Perceived Dyspnea 0-4      Progression   Progression Continue to progress workloads to maintain intensity without signs/symptoms of physical distress.      Resistance Training   Training Prescription Yes    Weight 3 lb    Reps 10-15             Perform Capillary Blood Glucose checks as needed.  Exercise Prescription Changes:   Exercise Prescription Changes     Row Name 11/27/20 1600 12/10/20  1300 12/24/20 1500 01/06/21 1500 01/08/21 1600     Response to Exercise   Blood Pressure (Admit) 104/62 112/66 102/62 126/60 --   Blood Pressure (Exercise) 122/62 140/70 124/64 136/74 --   Blood Pressure (Exit) 118/64 110/62 104/70 132/76 --   Heart Rate (Admit) 66 bpm 59 bpm 74 bpm 68 bpm --   Heart Rate  (Exercise) 89 bpm 103 bpm 76 bpm 101 bpm --   Heart Rate (Exit) 66 bpm 74 bpm 71 bpm 99 bpm --   Oxygen Saturation (Admit) 96 % -- -- -- --   Oxygen Saturation (Exercise) 95 % -- -- -- --   Oxygen Saturation (Exit) 96 % -- -- -- --   Rating of Perceived Exertion (Exercise) '13 13 11 11 ' --   Perceived Dyspnea (Exercise) 0 -- -- -- --   Symptoms R knee pain from arthritis 10/10 R knee pain -- R knee pain --   Comments walk test results first full day of exercise -- -- --   Duration -- Progress to 30 minutes of  aerobic without signs/symptoms of physical distress Progress to 30 minutes of  aerobic without signs/symptoms of physical distress Continue with 30 min of aerobic exercise without signs/symptoms of physical distress. --   Intensity -- THRR unchanged THRR unchanged THRR unchanged --     Progression   Progression -- Continue to progress workloads to maintain intensity without signs/symptoms of physical distress. Continue to progress workloads to maintain intensity without signs/symptoms of physical distress. Continue to progress workloads to maintain intensity without signs/symptoms of physical distress. 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  Start with 1 day   Initial Home Exercises Provided -- -- -- -- 01/08/21    Row Name 01/22/21 1100 02/03/21 1000           Response to Exercise   Blood Pressure (Admit) 122/66 110/60      Blood Pressure  (Exercise) 122/72 126/68      Blood Pressure (Exit) 130/72 128/78      Heart Rate (Admit) 86 bpm 60 bpm      Heart Rate (Exercise) 97 bpm 110 bpm      Heart Rate (Exit) 73 bpm 90 bpm      Rating of Perceived Exertion (Exercise) 12 12      Symptoms none knee pain      Duration Continue with 30 min of aerobic exercise without signs/symptoms of physical distress. Continue with 30 min of aerobic exercise without signs/symptoms of physical distress.      Intensity THRR unchanged THRR unchanged  Progression      Progression Continue to progress workloads to maintain intensity without signs/symptoms of physical distress. Continue to progress workloads to maintain intensity without signs/symptoms of physical distress.      Average METs 2.66 3             Resistance Training      Training Prescription Yes Yes      Weight 4 lb 4 lb      Reps 10-15 10-15             Interval Training      Interval Training No No             NuStep      Level 4 3      Minutes 30 30      METs 2.9 3             Biostep-RELP      Level -- 2      Minutes -- 30      METs -- 3             Home Exercise Plan      Plans to continue exercise at [employer] (comment)  [place] (comment)  [place] videos      Frequency Add 2 additional days to program exercise sessions.  Start with 1 day Add 2 additional days to program exercise sessions.  Start with 1 day      Initial Home Exercises Provided 01/08/21 01/08/21              Exercise Comments:   Exercise Goals and Review:   Exercise Goals     Row Name 11/27/20 1636             Exercise Goals   Increase Physical Activity Yes       Intervention Provide advice, education, support and counseling about physical activity/exercise needs.;Develop an individualized exercise prescription for aerobic and resistive training based on initial evaluation findings, risk stratification,  comorbidities and participant's personal goals.       Expected Outcomes Short Term: Attend rehab on a regular basis to increase amount of physical activity.;Long Term: Add in home exercise to make exercise part of routine and to increase amount of physical activity.;Long Term: Exercising regularly at least 3-5 days a week.       Increase Strength and Stamina Yes       Intervention Provide advice, education, support and counseling about physical activity/exercise needs.;Develop an individualized exercise prescription for aerobic and resistive training based on initial evaluation findings, risk stratification, comorbidities and participant's personal goals.       Expected Outcomes Short Term: Increase workloads from initial exercise prescription for resistance, speed, and METs.;Short Term: Perform resistance training exercises routinely during rehab and add in resistance training at home;Long Term: Improve cardiorespiratory fitness, muscular endurance and strength as measured by increased METs and functional capacity (6MWT)       Able to understand and use rate of perceived exertion (RPE) scale Yes       Intervention Provide education and explanation on how to use RPE scale       Expected Outcomes Short Term: Able to use RPE daily in rehab to express subjective intensity level;Long Term:  Able to use RPE to guide intensity level when exercising independently       Able to understand and use Dyspnea scale Yes       Intervention Provide education and explanation  on how to use Dyspnea scale       Expected Outcomes Short Term: Able to use Dyspnea scale daily in rehab to express subjective sense of shortness of breath during exertion;Long Term: Able to use Dyspnea scale to guide intensity level when exercising independently       Knowledge and understanding of Target Heart Rate Range (THRR) Yes       Intervention Provide education and explanation of THRR including how the numbers were predicted and where they  are located for reference       Expected Outcomes Short Term: Able to state/look up THRR;Short Term: Able to use daily as guideline for intensity in rehab;Long Term: Able to use THRR to govern intensity when exercising independently       Able to check pulse independently Yes       Intervention Provide education and demonstration on how to check pulse in carotid and radial arteries.;Review the importance of being able to check your own pulse for safety during independent exercise       Expected Outcomes Short Term: Able to explain why pulse checking is important during independent exercise;Long Term: Able to check pulse independently and accurately       Understanding of Exercise Prescription Yes       Intervention Provide education, explanation, and written materials on patient's individual exercise prescription       Expected Outcomes Short Term: Able to explain program exercise prescription;Long Term: Able to explain home exercise prescription to exercise independently                Exercise Goals Re-Evaluation :  Exercise Goals Re-Evaluation     Row Name 12/03/20 1546 12/10/20 1310 12/24/20 1552 01/06/21 1557 01/08/21 1623     Exercise Goal Re-Evaluation   Exercise Goals Review Increase Physical Activity;Able to understand and use rate of perceived exertion (RPE) scale;Knowledge and understanding of Target Heart Rate Range (THRR);Understanding of Exercise Prescription;Increase Strength and Stamina;Able to check pulse independently Increase Physical Activity;Increase Strength and Stamina;Understanding of Exercise Prescription Increase Physical Activity;Increase Strength and Stamina Increase Physical Activity;Increase Strength and Stamina;Understanding of Exercise Prescription Increase Physical Activity;Increase Strength and Stamina;Understanding of Exercise Prescription   Comments Reviewed RPE and dyspnea scales, THR and program prescription with pt today.  Pt voiced understanding and was  given a copy of goals to take home. Jocelyn Lamer has only completed her first full session of exercise.  She left early due to her knee pain this week.  She is hoping to get an injection for it to help manage pain better.  She is going to keep [place] posted on how things are going. Jocelyn Lamer has only attended 3 times this month.  COnsistent attendance is necessary for good progress.  Staff will monitor while in session. Jilleen continues to have inconsistent attendance.  She was here 5 times over the past month total.  She also continues to struggle with her knee.  We will continue to montior her progress and encourage improved attendance. Reviewed home exercise with pt today.  Pt plans to complete Youtube videos and is looking into the [facility]  for exercise.  Reviewed THR, pulse, RPE, sign and symptoms, pulse oximetery and when to call 911 or MD.  Also discussed weather considerations and indoor options.  Pt voiced understanding.   Expected Outcomes Short: Use RPE daily to regulate intensity. Long: Follow program prescription in THR. Short: Get some knee pain relief and return to attendance Long: Follow program prescription Short: get back to  consistent attendance Long:  build stamina Short: Regular attendance Long: Continue to improve stamina. Short: Add on 1 day of exercise Long: Exercise independently as home at appropriate exercise prescription    Row Name 01/22/21 1112 01/28/21 1539 02/03/21 1044         Exercise Goal Re-Evaluation   Exercise Goals Review Increase Physical Activity;Increase Strength and Stamina;Understanding of Exercise Prescription Increase Physical Activity;Increase Strength and Stamina;Understanding of Exercise Prescription Increase Physical Activity;Increase Strength and Stamina;Understanding of Exercise Prescription     Comments Nioka still continues with inconsistent attendance. However, she has improved to level 4 on the Nustep and jumped up to 4 lbs for her handweights. She should show more  improvement if she continuously comes. She should be encouraged to try different machines and walk if tolerated. Lailynn has not been participating in aerobic exercise. She is very limited by her knee as she cannot walk very far without it hurting. She is looking into joining the [employer] and would like the staff to send her information over. She is able to tolerated seated exercises very well. She is hoping to buy a pulse ox to monitor her heart rate as well. She is trying to at least walk to her mailbox and box. Laloni did buy 4 lb handweights and has been doing resistance training at home. Renie is fair in rehab.  Her knee continues to limit her daily.  She will usually do 30 min on the seated equipment.  We will conitnue to monitor her progress.     Expected Outcomes Short: Exercise on different machine as tolerated/ walk Long: Continue to build up overall strength and stamina Short: Dealer and participate and aerovic exercise Long: Exercise independently at home at appropriate prescription Short: Increase workload on BioStep Long: Conitnue to improve stamina.              Discharge Exercise Prescription (Final Exercise Prescription Changes):  Exercise Prescription Changes - 02/03/21 1000       Response to Exercise   Blood Pressure (Admit) 110/60    Blood Pressure (Exercise) 126/68    Blood Pressure (Exit) 128/78    Heart Rate (Admit) 60 bpm    Heart Rate (Exercise) 110 bpm    Heart Rate (Exit) 90 bpm    Rating of Perceived Exertion (Exercise) 12    Symptoms knee pain    Duration Continue with 30 min of aerobic exercise without signs/symptoms of physical distress.    Intensity THRR unchanged      Progression   Progression Continue to progress workloads to maintain intensity without signs/symptoms of physical distress.    Average METs 3      Resistance Training   Training Prescription Yes    Weight 4 lb    Reps 10-15      Interval Training   Interval Training No       NuStep   Level 3    Minutes 30    METs 3      Biostep-RELP   Level 2    Minutes 30    METs 3      Home Exercise Plan   Plans to continue exercise at [employer] (comment)   [place] videos   Frequency Add 2 additional days to program exercise sessions.   Start with 1 day   Initial Home Exercises Provided 01/08/21             Nutrition:  Target Goals: Understanding of nutrition guidelines, daily intake of sodium <  154m, cholesterol <2015m calories 30% from fat and 7% or less from saturated fats, daily to have 5 or more servings of fruits and vegetables.  Education: All About Nutrition: -Group instruction provided by verbal, written material, interactive activities, discussions, models, and posters to present general guidelines for heart healthy nutrition including fat, fiber, MyPlate, the role of sodium in heart healthy nutrition, utilization of the nutrition label, and utilization of this knowledge for meal planning. Follow up email sent as well. Written material given at graduation. [facility]  Date 12/08/15  Educator CoJaclyn ShaggyInstruction Review Code (retired) 2- meets goals/outcomes       Biometrics:  Pre Biometrics - 11/27/20 1629       Pre Biometrics   Height '5\' 6"'  (1.676 m)    Weight 246 lb 3.2 oz (111.7 kg)    BMI (Calculated) 39.76    Single Leg Stand 9.6 seconds              Nutrition Therapy Plan and Nutrition Goals:  Nutrition Therapy & Goals - 12/22/20 1528       Nutrition Therapy   Diet Low Na, HH, DM diet    Drug/Food Interactions Statins/Certain Fruits    Protein (specify units) 90g    Fiber 25 grams    Whole Grain Foods 3 servings    Saturated Fats 12 max. grams    Fruits and Vegetables 8 servings/day    Sodium 1.5 grams      Personal Nutrition Goals   Nutrition Goal ST: split white rice with brown rice LT: increase whole grains    Comments 6am will  wake up, gets her gradchildren around 7am. B: bowl of oatmeal with 1/2 cup OJ and apple or banana L: the other day had a fruit salad with regular gingerale (hates diet soda), lean cuisine - macoroni wilth salsbury steak S: 1/2 cup popcorn D: porkchops, greens (largest portion), and rice (white rice). S: apple and peanut butter. Drinks: water with flavoring. greens, cabbage, green beans - doesn't like other vegetables. She will not eat any other vegetables. Husband uses pam cooking spray. Uses Mrs. Dash. Her Bg will run around 102 or 106. A1C: around 6. She doesn't want to change the way she eats, but is willing to try mixing brown rice with white rice. Discussed heart healthy, diabetes friendly eatinf.      Intervention Plan   Intervention Prescribe, educate and counsel regarding individualized specific dietary modifications aiming towards targeted core components such as weight, hypertension, lipid management, diabetes, heart failure and other comorbidities.;Nutrition handout(s) given to patient.    Expected Outcomes Short Term Goal: Understand basic principles of dietary content, such as calories, fat, sodium, cholesterol and nutrients.;Long Term Goal: Adherence to prescribed nutrition plan.;Short Term Goal: A plan has been developed with personal nutrition goals set during dietitian appointment.             Nutrition Assessments:  MEDIFICTS Score Key: ?70 Need to make dietary changes  40-70 Heart Healthy Diet ? 40 Therapeutic Level Cholesterol Diet  [facility]  Picture Your Plate Total Score on Admission 50      Picture Your Plate Scores: <4<67nhealthy dietary pattern with much room for improvement. 41-50 Dietary pattern unlikely to meet recommendations for good health and room for improvement. 51-60 More healthful dietary pattern, with some room for improvement.  >60 Healthy dietary pattern, although  there may be some  specific behaviors that could be improved.    Nutrition Goals Re-Evaluation:  Nutrition Goals Re-Evaluation     [place] Name 01/28/21 1543             Goals   Nutrition Goal ST: split white rice with brown rice LT: increase whole grains       Comment Mahiya tried brown rice but did not like it. She doesn't think she can use it in her diet. She does try eat a mix of a white/wheat bread. She does not eat whole wheat breads as she also does not like the taste of that. She has tried to incoporate it with other flavors to help. She is looking to lose weight and has eliminated her sweet snacks. She opted to get light frozen yogurt to replace her normal ice cream.       Expected Outcome Short: Continue to increase whole grains in diet Long: Continue to maintain heart healthy diet                Nutrition Goals Discharge (Final Nutrition Goals Re-Evaluation):  Nutrition Goals Re-Evaluation - 01/28/21 1543       Goals   Nutrition Goal ST: split white rice with brown rice LT: increase whole grains    Comment Kiya tried brown rice but did not like it. She doesn't think she can use it in her diet. She does try eat a mix of a white/wheat bread. She does not eat whole wheat breads as she also does not like the taste of that. She has tried to incoporate it with other flavors to help. She is looking to lose weight and has eliminated her sweet snacks. She opted to get light frozen yogurt to replace her normal ice cream.    Expected Outcome Short: Continue to increase whole grains in diet Long: Continue to maintain heart healthy diet             Psychosocial: Target Goals: Acknowledge presence or absence of significant depression and/or stress, maximize coping skills, provide positive support system. Participant is able to verbalize types and ability to use techniques and skills needed for reducing stress and depression.   Education: Stress, Anxiety, and Depression - Group verbal and visual  presentation to define topics covered.  Reviews how body is impacted by stress, anxiety, and depression.  Also discusses healthy ways to reduce stress and to treat/manage anxiety and depression.  Written material given at graduation. Flowsheet Row Cardiac Rehab from 02/09/2016 in [facility] Cardiac and Pulmonary Rehab  Date 12/03/15  Educator Kathreen Cornfield, [facility]  Instruction Review Code (retired) 2- meets goals/outcomes       Education: Sleep Hygiene -Provides group verbal and written instruction about how sleep can affect your health.  Define sleep hygiene, discuss sleep cycles and impact of sleep habits. Review good sleep hygiene tips.    Initial Review & Psychosocial Screening:  Initial Psych Review & Screening - 11/21/20 1430       Initial Review   Current issues with History of Depression;Current Stress Concerns      Family Dynamics   Good Support System? Yes      Barriers   Psychosocial barriers to participate in program The patient should benefit from training in stress management and relaxation.;There are no identifiable barriers or psychosocial needs.      Screening Interventions   Interventions Encouraged to exercise;Provide feedback about the scores to participant;To provide support and resources with identified psychosocial needs  Expected Outcomes Short Term goal: Utilizing psychosocial counselor, staff and physician to assist with identification of specific Stressors or current issues interfering with healing process. Setting desired goal for each stressor or current issue identified.;Long Term Goal: Stressors or current issues are controlled or eliminated.;Short Term goal: Identification and review with participant of any Quality of Life or Depression concerns found by scoring the questionnaire.;Long Term goal: The participant improves quality of Life and PHQ9 Scores as seen by post scores and/or verbalization of changes             Quality of Life Scores:   Quality of Life  - 11/27/20 1650       Quality of Life Scores   Health/Function Pre 22.65 %    Socioeconomic Pre 24.75 %    Psych/Spiritual Pre 28.29 %    Family Pre 28.8 %    GLOBAL Pre 25.36 %            Scores of 19 and below usually indicate a poorer quality of life in these areas.  A difference of  2-3 points is a clinically meaningful difference.  A difference of 2-3 points in the total score of the Quality of Life Index has been associated with significant improvement in overall quality of life, self-image, physical symptoms, and general health in studies assessing change in quality of life.  PHQ-9: Recent Review Flowsheet Data     Depression screen [facility] 2/9 11/27/2020 11/17/2020 08/20/2020 08/01/2020 04/24/2020   Decreased Interest 0 0 0 0 0   Down, Depressed, Hopeless 0 0 0 0 0   PHQ - 2 Score 0 0 0 0 0   Altered sleeping 0 - - - -   Tired, decreased energy 0 - - - -   Change in appetite 0 - - - -   Feeling bad or failure about yourself  0 - - - -   Trouble concentrating 0 - - - -   Moving slowly or fidgety/restless 0 - - - -   Suicidal thoughts 0 - - - -   PHQ-9 Score 0 - - - -   Difficult doing work/chores Not difficult at all - - - -      Interpretation of Total Score  Total Score Depression Severity:  1-4 = Minimal depression, 5-9 = Mild depression, 10-14 = Moderate depression, 15-19 = Moderately severe depression, 20-27 = Severe depression   Psychosocial Evaluation and Intervention:  Psychosocial Evaluation - 11/21/20 1438       Psychosocial Evaluation & Interventions   Comments Tytionna is returning to Cardiac Rehab with another stent. She did some of the program in 2020. She states she is doing well and "doesn't let things stress me out." She reports to have a good support system, sleeping well, and is enjoying her life day to day. She is looking forward to coming to Cardiac Rehab to start back exercising and lose weight and hopes to get into a good routine.    Expected Outcomes  Short: attend cardiac rehab for education and exercise. Long: develop and maintain positive self care habits.    Continue Psychosocial Services  Follow up required by staff             Psychosocial Re-Evaluation:  Psychosocial Re-Evaluation     [place] Name 01/01/21 1608 01/28/21 1550           Psychosocial Re-Evaluation   Current issues with None Identified None Identified      Comments Patient reports no  issues with their current mental states, sleep, stress, depression or anxiety. Will follow up with patient in a few weeks for any changes. Alyssabeth denies any concerns with depression, anxiety, or big stressors. She has good support from husband and kids. Feels exercise has helped her feel stronger even though she's limited by walking,      Expected Outcomes Short: Continue to exercise regularly to support mental health and notify staff of any changes. Long: maintain mental health and well being through teaching of rehab or prescribed medications independently. Short: Continue attending rehab consistently Long: Continue to exercise for stress management and maintain posititve attitude      Interventions Encouraged to attend Cardiac Rehabilitation for the exercise Encouraged to attend Cardiac Rehabilitation for the exercise      Continue Psychosocial Services  Follow up required by staff Follow up required by staff               Psychosocial Discharge (Final Psychosocial Re-Evaluation):  Psychosocial Re-Evaluation - 01/28/21 1550       Psychosocial Re-Evaluation   Current issues with None Identified    Comments Maryum denies any concerns with depression, anxiety, or big stressors. She has good support from husband and kids. Feels exercise has helped her feel stronger even though she's limited by walking,    Expected Outcomes Short: Continue attending rehab consistently Long: Continue to exercise for stress management and maintain posititve attitude    Interventions Encouraged to  attend Cardiac Rehabilitation for the exercise    Continue Psychosocial Services  Follow up required by staff             Vocational Rehabilitation: Provide vocational rehab assistance to qualifying candidates.   Vocational Rehab Evaluation & Intervention:  Vocational Rehab - 11/21/20 1430       Initial Vocational Rehab Evaluation & Intervention   Assessment shows need for Vocational Rehabilitation No             Education: Education Goals: Education classes will be provided on a variety of topics geared toward better understanding of heart health and risk factor modification. Participant will state understanding/return demonstration of topics presented as noted by education test scores.  Learning Barriers/Preferences:  Learning Barriers/Preferences - 11/21/20 1430       Learning Barriers/Preferences   Learning Barriers Sight   glasses   Learning Preferences Individual Instruction;Verbal Instruction             General Cardiac Education Topics:  AED/CPR: - Group verbal and written instruction with the use of models to demonstrate the basic use of the AED with the basic ABC's of resuscitation.   Anatomy and Cardiac Procedures: - Group verbal and visual presentation and models provide information about basic cardiac anatomy and function. Reviews the testing methods done to diagnose heart disease and the outcomes of the test results. Describes the treatment choices: Medical Management, Angioplasty, or Coronary Bypass Surgery for treating various heart conditions including Myocardial Infarction, Angina, Valve Disease, and Cardiac Arrhythmias.  Written material given at graduation.   Medication Safety: - Group verbal and visual instruction to review commonly prescribed medications for heart and lung disease. Reviews the medication, class of the drug, and side effects. Includes the steps to properly store meds and maintain the prescription regimen.  Written material  given at graduation.   Intimacy: - Group verbal instruction through game format to discuss how heart and lung disease can affect sexual intimacy. Written material given at graduation..   Know Your Numbers and Heart  Failure: - Group verbal and visual instruction to discuss disease risk factors for cardiac and pulmonary disease and treatment options.  Reviews associated critical values for Overweight/Obesity, Hypertension, Cholesterol, and Diabetes.  Discusses basics of heart failure: signs/symptoms and treatments.  Introduces Heart Failure Zone chart for action plan for heart failure.  Written material given at graduation.   Infection Prevention: - Provides verbal and written material to individual with discussion of infection control including proper hand washing and proper equipment cleaning during exercise session. Flowsheet Row Cardiac Rehab from 11/27/2020 in [facility] Cardiac and Pulmonary Rehab  Date 11/27/20  Educator [place]  Instruction Review Code 1- Verbalizes Understanding       Falls Prevention: - Provides verbal and written material to individual with discussion of falls prevention and safety. Flowsheet Row Cardiac Rehab from 11/27/2020 in [facility] Cardiac and Pulmonary Rehab  Date 11/27/20  Educator [place]  Instruction Review Code 1- Verbalizes Understanding       Other: -Provides group and verbal instruction on various topics (see comments)   Knowledge Questionnaire Score:  Knowledge Questionnaire Score - 11/27/20 1639       Knowledge Questionnaire Score   Pre Score 18/26: Heart attack, HF, Nutrition, Stress, Exercise, Nutrition             Core Components/Risk Factors/Patient Goals at Admission:  Personal Goals and Risk Factors at Admission - 11/27/20 1637       Core Components/Risk Factors/Patient Goals on Admission    Weight Management Yes;Obesity;Weight Loss    Intervention Weight Management: Develop a combined nutrition and exercise program designed to reach  desired caloric intake, while maintaining appropriate intake of nutrient and fiber, sodium and fats, and appropriate energy expenditure required for the weight goal.;Weight Management: Provide education and appropriate resources to help participant work on and attain dietary goals.;Weight Management/Obesity: Establish reasonable short term and long term weight goals.;Obesity: Provide education and appropriate resources to help participant work on and attain dietary goals.    Admit Weight 246 lb (111.6 kg)    Goal Weight: Short Term 240 lb (108.9 kg)    Goal Weight: Long Term 200 lb (90.7 kg)    Expected Outcomes Short Term: Continue to assess and modify interventions until short term weight is achieved;Long Term: Adherence to nutrition and physical activity/exercise program aimed toward attainment of established weight goal;Weight Loss: Understanding of general recommendations for a balanced deficit meal plan, which promotes 1-2 lb weight loss per week and includes a negative energy balance of [phone] kcal/d;Understanding recommendations for meals to include 15-35% energy as protein, 25-35% energy from fat, 35-60% energy from carbohydrates, less than 251m of dietary cholesterol, 20-35 gm of total fiber daily;Understanding of distribution of calorie intake throughout the day with the consumption of 4-5 meals/snacks    Diabetes Yes    Intervention Provide education about signs/symptoms and action to take for hypo/hyperglycemia.;Provide education about proper nutrition, including hydration, and aerobic/resistive exercise prescription along with prescribed medications to achieve blood glucose in normal ranges: Fasting glucose 65-99 mg/dL    Expected Outcomes Short Term: Participant verbalizes understanding of the signs/symptoms and immediate care of hyper/hypoglycemia, proper foot care and importance of medication, aerobic/resistive exercise and nutrition plan for blood glucose control.;Long Term: Attainment of  HbA1C < 7%.    Hypertension Yes    Intervention Provide education on lifestyle modifcations including regular physical activity/exercise, weight management, moderate sodium restriction and increased consumption of fresh fruit, vegetables, and low fat dairy, alcohol moderation, and smoking cessation.;Monitor prescription use compliance.  Expected Outcomes Short Term: Continued assessment and intervention until BP is < 140/29m HG in hypertensive participants. < 130/856mHG in hypertensive participants with diabetes, heart failure or chronic kidney disease.;Long Term: Maintenance of blood pressure at goal levels.    Lipids Yes    Intervention Provide education and support for participant on nutrition & aerobic/resistive exercise along with prescribed medications to achieve LDL <7011mHDL >61m5m  Expected Outcomes Short Term: Participant states understanding of desired cholesterol values and is compliant with medications prescribed. Participant is following exercise prescription and nutrition guidelines.;Long Term: Cholesterol controlled with medications as prescribed, with individualized exercise RX and with personalized nutrition plan. Value goals: LDL < 70mg34mL > 40 mg.             Education:Diabetes - Individual verbal and written instruction to review signs/symptoms of diabetes, desired ranges of glucose level fasting, after meals and with exercise. Acknowledge that pre and post exercise glucose checks will be done for 3 sessions at entry of program. [facility]  Education need identified 11/27/20  Date 11/27/20  Educator [place]  IRossertruction Review Code 1- Verbalizes Understanding       Core Components/Risk Factors/Patient Goals Review:   Goals and Risk Factor Review     Row Name 01/01/21 1605 01/28/21 1545           Core Components/Risk Factors/Patient Goals Review   Personal Goals Review Weight  Management/Obesity;Hypertension;Diabetes Weight Management/Obesity;Hypertension;Diabetes      Review Valarie has been checking her blood pressure daily, once in the morning and once at night. She checks her sugar in the morning before she eats. Her blood sugar has been around 90-100 before she eats. She wants to work on weight loss. Braeleigh continues to check her sugars every morning, states its around 118 as a fasting glucose before breakfast. Her BP cuff broke and she is planning on getting a new one so she has not checked her BP. She wants to lose weight- she has been giving up her sweets, especially ice cream. She is opting to get frozen yogurt- light version instead. Her goal weight is 180 lb and is hoping her exericse will help too.      Expected Outcomes Short: lose 5 pounds in the next two weeks. Long: reach a weight goal of 200. Short: Continue making modifcations to diet to promote weight loss Long: Continue to manage lifestyle risk factors               Core Components/Risk Factors/Patient Goals at Discharge (Final Review):   Goals and Risk Factor Review - 01/28/21 1545       Core Components/Risk Factors/Patient Goals Review   Personal Goals Review Weight Management/Obesity;Hypertension;Diabetes    Review Jewell continues to check her sugars every morning, states its around 118 as a fasting glucose before breakfast. Her BP cuff broke and she is planning on getting a new one so she has not checked her BP. She wants to lose weight- she has been giving up her sweets, especially ice cream. She is opting to get frozen yogurt- light version instead. Her goal weight is 180 lb and is hoping her exericse will help too.    Expected Outcomes Short: Continue making modifcations to diet to promote weight loss Long: Continue to manage lifestyle risk factors             ITP Comments:  ITP Comments     Row Name  11/21/20 1432 11/27/20 1704 12/03/20 1546 12/17/20 0930 12/18/20 1649   ITP Comments  Initial telephone orienation completed. Diagnosis can be found in [facility] 5/3. EP orientation scheduled for Thursday 5/26 at 3pm. Completed 6MWT and gym orientation. Initial ITP created and sent for review to Dr. Emily Filbert, Medical Director. First full day of exercise!  Patient was oriented to gym and equipment including functions, settings, policies, and procedures.  Patient's individual exercise prescription and treatment plan were reviewed.  All starting workloads were established based on the results of the 6 minute walk test done at initial orientation visit.  The plan for exercise progression was also introduced and progression will be customized based on patient's performance and goals. 30 Day review completed. Medical Director ITP review done, changes made as directed, and signed approval by Medical Director.  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 Before then we will need to discharge her and when she is ready to return she can get a new referral to come back to rehab. Until then she will continue to come to rehab. She will know by the end of June what the date is.    [place] Name 01/14/21 1208 02/11/21 0834         ITP Comments 30 Day review completed. Medical Director ITP review done, changes made as directed, and signed approval by Medical Director. 30 Day review completed. Medical Director ITP review done, changes made as directed, and signed approval by Medical Director.               Comments:

## 2021-02-16 ENCOUNTER — Other Ambulatory Visit: Payer: Self-pay

## 2021-02-16 DIAGNOSIS — Z955 Presence of coronary angioplasty implant and graft: Secondary | ICD-10-CM | POA: Diagnosis not present

## 2021-02-16 NOTE — Progress Notes (Signed)
Daily Session Note  Patient Details  Name: Felicia Woods MRN: [id number] Date of Birth: [birth year] Referring Provider:   Flowsheet Row Cardiac Rehab from 11/27/2020 in [facility] Cardiac and Pulmonary Rehab  Referring Provider Serafina Royals MD       Encounter Date: 02/16/2021  Check In:  Session Check In - 02/16/21 1534       Check-In   Supervising physician immediately available to respond to emergencies See telemetry face sheet for immediately available ER MD    Location [facility]-Cardiac & Pulmonary Rehab    Staff Present Birdie Sons, MPA, Nino Glow, MS, ASCM CEP, Exercise Physiologist;Joseph Tessie Fass, [place]    Virtual Visit No    Medication changes reported     No    Fall or balance concerns reported    No    Warm-up and Cool-down Performed on first and last piece of equipment    Resistance Training Performed Yes    VAD Patient? No    PAD/SET Patient? No      Pain Assessment   Currently in Pain? No/denies                Social History   Tobacco Use  Smoking Status Never  Smokeless Tobacco Never    Goals Met:  Independence with exercise equipment Exercise tolerated well No report of cardiac concerns or symptoms Strength training completed today  Goals Unmet:  Not Applicable  Comments: Pt able to follow exercise prescription today without complaint.  Will continue to monitor for progression.    Dr. Emily Filbert is Medical Director for [place].  Dr. Ottie Glazier is Medical Director for [facility] Pulmonary Rehabilitation.

## 2021-02-19 ENCOUNTER — Other Ambulatory Visit: Payer: Self-pay

## 2021-02-19 ENCOUNTER — Ambulatory Visit (INDEPENDENT_AMBULATORY_CARE_PROVIDER_SITE_OTHER): Payer: Medicare Other | Admitting: Physician Assistant

## 2021-02-19 ENCOUNTER — Encounter: Payer: Self-pay | Admitting: Physician Assistant

## 2021-02-19 VITALS — BP 124/78 | HR 79 | Temp 97.3°F | Resp 16 | Ht 67.0 in | Wt 241.2 lb

## 2021-02-19 DIAGNOSIS — E1165 Type 2 diabetes mellitus with hyperglycemia: Secondary | ICD-10-CM

## 2021-02-19 DIAGNOSIS — M542 Cervicalgia: Secondary | ICD-10-CM

## 2021-02-19 DIAGNOSIS — K219 Gastro-esophageal reflux disease without esophagitis: Secondary | ICD-10-CM | POA: Diagnosis not present

## 2021-02-19 DIAGNOSIS — I10 Essential (primary) hypertension: Secondary | ICD-10-CM | POA: Diagnosis not present

## 2021-02-19 DIAGNOSIS — I2510 Atherosclerotic heart disease of native coronary artery without angina pectoris: Secondary | ICD-10-CM

## 2021-02-19 DIAGNOSIS — G4709 Other insomnia: Secondary | ICD-10-CM

## 2021-02-19 LAB — POCT GLYCOSYLATED HEMOGLOBIN (HGB A1C): Hemoglobin A1C: 7 % — AB (ref 4.0–5.6)

## 2021-02-19 MED ORDER — FAMOTIDINE 20 MG PO TABS: 20.0000 mg | ORAL_TABLET | Freq: Two times a day (BID) | ORAL | 2 refills | Status: DC

## 2021-02-19 MED ORDER — TETANUS-DIPHTH-ACELL PERTUSSIS 5-2.5-18.5 LF-MCG/0.5 IM SUSP: 0.5000 mL | Freq: Once | INTRAMUSCULAR | 0 refills | Status: AC

## 2021-02-19 MED ORDER — PNEUMOCOCCAL 20-VAL CONJ VACC 0.5 ML IM SUSY: 0.5000 mL | PREFILLED_SYRINGE | INTRAMUSCULAR | 0 refills | Status: AC

## 2021-02-19 MED ORDER — TIZANIDINE HCL 2 MG PO TABS: 2.0000 mg | ORAL_TABLET | Freq: Every day | ORAL | 0 refills | Status: DC

## 2021-02-19 MED ORDER — ZOLPIDEM TARTRATE 10 MG PO TABS: 10.0000 mg | ORAL_TABLET | Freq: Every evening | ORAL | 2 refills | Status: DC | PRN

## 2021-02-19 NOTE — Progress Notes (Signed)
[facility], [place] [address]  Internal MEDICINE  Office Visit Note  Patient Name: Felicia Woods  [id number]  [id number]  Date of Service: 02/25/2021  Chief Complaint  Patient presents with   Follow-up    Cough at night, discuss BP meds    HPI Pt is here for routine follow up -Cardiac rehab is going well, sees cardiology once she is done with this -BP stable at home and well controlled in office -BG running 101 in Am, taking invokana and trulicity without any problems -Cough at night, has some allergies. Additionally taking reflux medications and needs a refill of this. -Having some neck pain and muscle spasms, requesting alternative muscle relaxer than flexeril  Current Medication: Outpatient Encounter Medications as of 02/19/2021  Medication Sig   Accu-Chek Softclix Lancets lancets Use as instructed twice a day to check blood sugars  DIAG -E11.59   albuterol (PROVENTIL HFA;VENTOLIN HFA) 108 (90 Base) MCG/ACT inhaler Inhale 2 puffs into the lungs every 6 (six) hours as needed for wheezing or shortness of breath.   amLODipine (NORVASC) 10 MG tablet Take 1 tablet (10 mg total) by mouth daily.   aspirin EC 81 MG tablet Take 81 mg by mouth daily.   atorvastatin (LIPITOR) 80 MG tablet Take 1 tablet (80 mg total) by mouth daily.   canagliflozin (INVOKANA) 300 MG TABS tablet Take 1 tablet (300 mg total) by mouth daily before breakfast.   clopidogrel (PLAVIX) 75 MG tablet Take 1 tablet (75 mg total) by mouth daily.   dapagliflozin propanediol (FARXIGA) 10 MG TABS tablet Farxiga 10 mg tablet   Dulaglutide (TRULICITY) 1.5 KY/7.0WC SOPN INJECT 1.5MG INTO THE SKIN ONCE A WEEK   famotidine (PEPCID) 20 MG tablet Take 1 tablet (20 mg total) by mouth 2 (two) times daily.   furosemide (LASIX) 40 MG tablet Take 1 tablet (40 mg total) by mouth 2 (two) times daily.   glucose blood (ACCU-CHEK GUIDE) test strip Use as instructed to check blood sugars twice a day   E11.59   isosorbide mononitrate (IMDUR) 60 MG 24 hr tablet Take 60 mg by mouth daily.   losartan (COZAAR) 100 MG tablet Take 1 tablet (100 mg total) by mouth daily. Take one tab po qd   metoprolol succinate (TOPROL-XL) 25 MG 24 hr tablet Take 25 mg by mouth daily.   montelukast (SINGULAIR) 10 MG tablet Take 1 tablet (10 mg total) by mouth at bedtime.   neomycin-polymyxin-dexamethasone (MAXITROL) 0.1 % ophthalmic suspension neomycin-polymyxin-dexameth 3.5 mg/mL-10,000 unit/mL-0.1% eye drops  INSTILL 1 DROP INTO EACH EYE 4 TIMES DAILY FOR 7 DAYS   nitroGLYCERIN (NITROSTAT) 0.4 MG SL tablet Place 1 tablet (0.4 mg total) under the tongue every 5 (five) minutes x 3 doses as needed for chest pain. *If no relief call MD or go to Emergency Room*   nitroGLYCERIN (NITROSTAT) 0.4 MG SL tablet Place under the tongue.   oxybutynin (DITROPAN-XL) 5 MG 24 hr tablet Take 1 tablet (5 mg total) by mouth daily with supper.   ranolazine (RANEXA) 500 MG 12 hr tablet Take 1 tablet by mouth 2 (two) times daily.   tiZANidine (ZANAFLEX) 2 MG tablet Take 1 tablet (2 mg total) by mouth at bedtime.   [DISCONTINUED] bisoprolol-hydrochlorothiazide (ZIAC) 5-6.25 MG tablet Take 1 tablet by mouth once daily   [DISCONTINUED] cyclobenzaprine (FLEXERIL) 10 MG tablet Take 1 tablet (10 mg total) by mouth at bedtime.   [DISCONTINUED] famotidine (PEPCID) 20 MG tablet Take 1 tablet (20 mg  total) by mouth 2 (two) times daily.   [DISCONTINUED] pneumococcal 20-Val Conj Vacc (PREVNAR 20) 0.5 ML injection Inject 0.5 mLs into the muscle tomorrow at 10 am.   [DISCONTINUED] Tdap (BOOSTRIX) 5-2.5-18.5 LF-MCG/0.5 injection Inject 0.5 mLs into the muscle once.   [DISCONTINUED] zolpidem (AMBIEN) 10 MG tablet Take 1 tablet (10 mg total) by mouth at bedtime as needed for sleep.   [EXPIRED] pneumococcal 20-Val Conj Vacc (PREVNAR 20) 0.5 ML injection Inject 0.5 mLs into the muscle tomorrow at 10 am for 1 dose.   [EXPIRED] Tdap (BOOSTRIX) 5-2.5-18.5  LF-MCG/0.5 injection Inject 0.5 mLs into the muscle once for 1 dose.   zolpidem (AMBIEN) 10 MG tablet Take 1 tablet (10 mg total) by mouth at bedtime as needed for sleep.   [DISCONTINUED] erythromycin ophthalmic ointment erythromycin 5 mg/gram (0.5 %) eye ointment (Patient not taking: Reported on 02/19/2021)   [DISCONTINUED] methocarbamol (ROBAXIN) 500 MG tablet methocarbamol 500 mg tablet  TAKE 1 TABLET BY MOUTH EVERY 6 HOURS AS NEEDED (Patient not taking: Reported on 02/19/2021)   [DISCONTINUED] potassium chloride SA (KLOR-CON) 20 MEQ tablet potassium chloride ER 20 mEq tablet,extended release(part/cryst)  TAKE 1 TABLET BY MOUTH ONCE DAILY FOR 5 DAYS (Patient not taking: Reported on 02/19/2021)   [DISCONTINUED] potassium chloride SA (KLOR-CON) 20 MEQ tablet Take 20 mEq by mouth daily. (Patient not taking: Reported on 02/19/2021)   [DISCONTINUED] trimethoprim-polymyxin b (POLYTRIM) ophthalmic solution polymyxin B sulfate 10,000 unit-trimethoprim 1 mg/mL eye drops  INSTILL 1 DROP INTO EACH EYE 4 TIMES DAILY FOR 7 DAYS (Patient not taking: Reported on 02/19/2021)   No facility-administered encounter medications on file as of 02/19/2021.    Surgical History: Past Surgical History:  Procedure Laterality Date   ABDOMINAL HYSTERECTOMY     CARDIAC CATHETERIZATION     CARDIAC CATHETERIZATION N/A 10/18/2015   Procedure: Left Heart Cath and Cors/Grafts Angiography;  Surgeon: Lorretta Harp, MD;  Location: [place] [facility];  Service: Cardiovascular;  Laterality: N/A;   CARDIAC CATHETERIZATION N/A 10/18/2015   Procedure: Coronary Stent Intervention;  Surgeon: Lorretta Harp, MD;  Location: [place] [facility];  Service: Cardiovascular;  Laterality: N/A;   CARDIAC SURGERY     CHOLECYSTECTOMY     CORONARY ANGIOPLASTY     CORONARY ARTERY BYPASS GRAFT     4 vessels - 2010   CORONARY STENT INTERVENTION N/A 02/16/2017   Procedure: CORONARY STENT INTERVENTION;  Surgeon: Yolonda Kida, MD;  Location: [place] [facility];  Service: Cardiovascular;  Laterality: N/A;   CORONARY STENT INTERVENTION N/A 02/13/2019   Procedure: CORONARY STENT INTERVENTION;  Surgeon: Nelva Bush, MD;  Location: [place] [facility];  Service: Cardiovascular;  Laterality: N/A;  SVG to RCA   CORONARY STENT INTERVENTION Left 03/08/2019   Procedure: CORONARY STENT INTERVENTION;  Surgeon: Yolonda Kida, MD;  Location: [place] [facility];  Service: Cardiovascular;  Laterality: Left;   CORONARY STENT INTERVENTION N/A 11/04/2020   Procedure: CORONARY STENT INTERVENTION;  Surgeon: Nelva Bush, MD;  Location: [place] [facility];  Service: Cardiovascular;  Laterality: N/A;   LEFT HEART CATH AND CORONARY ANGIOGRAPHY Left 02/16/2017   Procedure: LEFT HEART CATH AND CORONARY ANGIOGRAPHY;  Surgeon: Corey Skains, MD;  Location: [place] [facility];  Service: Cardiovascular;  Laterality: Left;   LEFT HEART CATH AND CORS/GRAFTS ANGIOGRAPHY Left 11/23/2017   Procedure: LEFT HEART CATH AND CORS/GRAFTS ANGIOGRAPHY;  Surgeon: Corey Skains, MD;  Location: [place] [facility];  Service: Cardiovascular;  Laterality:  Left;   LEFT HEART CATH AND CORS/GRAFTS ANGIOGRAPHY N/A 02/13/2019   Procedure: LEFT HEART CATH AND CORS/GRAFTS ANGIOGRAPHY;  Surgeon: Corey Skains, MD;  Location: [place] [facility];  Service: Cardiovascular;  Laterality: N/A;   LEFT HEART CATH AND CORS/GRAFTS ANGIOGRAPHY N/A 07/03/2019   Procedure: LEFT HEART CATH AND CORS/GRAFTS ANGIOGRAPHY;  Surgeon: Corey Skains, MD;  Location: [place] [facility];  Service: Cardiovascular;  Laterality: N/A;   LEFT HEART CATH AND CORS/GRAFTS ANGIOGRAPHY N/A 11/04/2020   Procedure: LEFT HEART CATH AND CORS/GRAFTS ANGIOGRAPHY;  Surgeon: Corey Skains, MD;  Location: [place] [facility];  Service: Cardiovascular;  Laterality: N/A;    Medical History: Past Medical History:  Diagnosis Date   Asthma    Coronary artery disease    Diabetes mellitus without  complication ([place])    Heart attack ([place])    Hyperlipidemia    Hypertension    MI (myocardial infarction) ([place])    Migraine headache with aura    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    Family History: Family History  Problem Relation Age of Onset   Diabetes Mother    Diabetes Father    Cancer Father    Diabetes Brother     Social History   Socioeconomic History   Marital status: Single    Spouse name: Biochemist, clinical   Number of children: Not on file   Years of education: Not on file   Highest education level: Not on file  Occupational History   Not on file  Tobacco Use   Smoking status: Never   Smokeless tobacco: Never  Vaping Use   Vaping Use: Never used  Substance and Sexual Activity   Alcohol use: No    Alcohol/week: 0.0 standard drinks   Drug use: No   Sexual activity: Yes  Other Topics Concern   Not on file  Social History Narrative   Not on file   Social Determinants of Health   Financial Resource Strain: Not on file  Food Insecurity: Not on file  Transportation Needs: Not on file  Physical Activity: Not on file  Stress: Not on file  Social Connections: Not on file  Intimate Partner Violence: Not on file      Review of Systems  Constitutional:  Negative for chills, fatigue and unexpected weight change.  HENT:  Positive for postnasal drip. Negative for congestion, rhinorrhea, sneezing and sore throat.   Eyes:  Negative for redness.  Respiratory:  Negative for cough, chest tightness and shortness of breath.   Cardiovascular:  Negative for chest pain and palpitations.  Gastrointestinal:  Negative for abdominal pain, constipation, diarrhea, nausea and vomiting.  Genitourinary:  Negative for dysuria and frequency.  Musculoskeletal:  Positive for arthralgias, back pain and neck pain. Negative for joint swelling.  Skin:  Negative for rash.  Neurological: Negative.  Negative for tremors and numbness.  Hematological:  Negative for adenopathy. Does not bruise/bleed  easily.  Psychiatric/Behavioral:  Positive for sleep disturbance. Negative for behavioral problems (Depression) and suicidal ideas. The patient is not nervous/anxious.    Vital Signs: BP 124/78   Pulse 79   Temp (!) 97.3 F (36.3 C)   Resp 16   Ht _0  (1.702 m)   Wt 241 lb 3.2 oz (109.4 kg)   SpO2 98%   BMI 37.78 kg/m    Physical Exam Vitals and nursing note reviewed.  Constitutional:      General: She is not in acute distress.  Appearance: She is well-developed. She is obese. She is not diaphoretic.  HENT:     Head: Normocephalic and atraumatic.     Mouth/Throat:     Pharynx: No oropharyngeal exudate.  Eyes:     Pupils: Pupils are equal, round, and reactive to light.  Neck:     Thyroid: No thyromegaly.     Vascular: No JVD.     Trachea: No tracheal deviation.  Cardiovascular:     Rate and Rhythm: Normal rate and regular rhythm.     Heart sounds: Normal heart sounds. No murmur heard.   No friction rub. No gallop.  Pulmonary:     Effort: Pulmonary effort is normal. No respiratory distress.     Breath sounds: No wheezing or rales.  Chest:     Chest wall: No tenderness.  Abdominal:     General: Bowel sounds are normal.     Palpations: Abdomen is soft.  Musculoskeletal:        General: Normal range of motion.     Cervical back: Normal range of motion and neck supple.  Lymphadenopathy:     Cervical: No cervical adenopathy.  Skin:    General: Skin is warm and dry.  Neurological:     Mental Status: She is alert and oriented to person, place, and time.     Cranial Nerves: No cranial nerve deficit.  Psychiatric:        Behavior: Behavior normal.        Thought Content: Thought content normal.        Judgment: Judgment normal.       Assessment/Plan: 1. Uncontrolled type 2 diabetes mellitus with hyperglycemia (HCC) - POCT HgB A1C is 7.0 which is improved from 7.5 last visit. Will continue current medications and improve diet and exercise  2. Essential  hypertension Stable, continue current medications  3. Coronary artery disease involving native heart without angina pectoris, unspecified vessel or lesion type Followed by cardiology, undergoing cardiac rehab currently  4. Gastroesophageal reflux disease without esophagitis Continue pepcid - famotidine (PEPCID) 20 MG tablet; Take 1 tablet (20 mg total) by mouth 2 (two) times daily.  Dispense: 60 tablet; Refill: 2  5. Other insomnia May continue ambien as needed - zolpidem (AMBIEN) 10 MG tablet; Take 1 tablet (10 mg total) by mouth at bedtime as needed for sleep.  Dispense: 30 tablet; Refill: 2  6. Neck pain May take zanaflex as needed, this is alternative to flexeril and knows they cant be combined - tiZANidine (ZANAFLEX) 2 MG tablet; Take 1 tablet (2 mg total) by mouth at bedtime.  Dispense: 30 tablet; Refill: 0   General Counseling: Qamar verbalizes understanding of the findings of todays visit and agrees with plan of treatment. I have discussed any further diagnostic evaluation that may be needed or ordered today. We also reviewed her medications today. she has been encouraged to call the office with any questions or concerns that should arise related to todays visit.    Orders Placed This Encounter  Procedures   POCT HgB A1C    Meds ordered this encounter  Medications   Tdap (BOOSTRIX) 5-2.5-18.5 LF-MCG/0.5 injection    Sig: Inject 0.5 mLs into the muscle once for 1 dose.    Dispense:  0.5 mL    Refill:  0   pneumococcal 20-Val Conj Vacc (PREVNAR 20) 0.5 ML injection    Sig: Inject 0.5 mLs into the muscle tomorrow at 10 am for 1 dose.    Dispense:  0.5 mL  Refill:  0   famotidine (PEPCID) 20 MG tablet    Sig: Take 1 tablet (20 mg total) by mouth 2 (two) times daily.    Dispense:  60 tablet    Refill:  2   zolpidem (AMBIEN) 10 MG tablet    Sig: Take 1 tablet (10 mg total) by mouth at bedtime as needed for sleep.    Dispense:  30 tablet    Refill:  2   tiZANidine  (ZANAFLEX) 2 MG tablet    Sig: Take 1 tablet (2 mg total) by mouth at bedtime.    Dispense:  30 tablet    Refill:  0    This patient was seen by Drema Dallas, PA-C in collaboration with Dr. Clayborn Bigness as a part of collaborative care agreement.   Total time spent:35 Minutes Time spent includes review of chart, medications, test results, and follow up plan with the patient.      Dr Lavera Guise Internal medicine

## 2021-02-23 ENCOUNTER — Other Ambulatory Visit: Payer: Self-pay

## 2021-02-23 DIAGNOSIS — Z955 Presence of coronary angioplasty implant and graft: Secondary | ICD-10-CM | POA: Diagnosis not present

## 2021-02-23 NOTE — Progress Notes (Signed)
Daily Session Note  Patient Details  Name: Felicia Woods MRN: [id number] Date of Birth: 1956-06-07 Referring Provider:   Flowsheet Row Cardiac Rehab from 11/27/2020 in [facility] Cardiac and [facility]monary Rehab  Referring Provider Serafina Royals MD       Encounter Date: 02/23/2021  Check In:  Session Check In - 02/23/21 1534       Check-In   Supervising physician immediately available to respond to emergencies See telemetry face sheet for immediately available ER MD    Location [facility]-Cardiac & Pulmonary Rehab    Staff Present Birdie Sons, MPA, Nino Glow, MS, ASCM CEP, Exercise Physiologist;Amanda Oletta Darter, BA, ACSM CEP, Exercise Physiologist    Virtual Visit No    Medication changes reported     No    Fall or balance concerns reported    No    Warm-up and Cool-down Performed on first and last piece of equipment    Resistance Training Performed Yes    VAD Patient? No    PAD/SET Patient? No      Pain Assessment   Currently in Pain? No/denies                Social History   Tobacco Use  Smoking Status Never  Smokeless Tobacco Never    Goals Met:  Independence with exercise equipment Exercise tolerated well No report of cardiac concerns or symptoms Strength training completed today  Goals Unmet:  Not Applicable  Comments: Pt able to follow exercise prescription today without complaint.  Will continue to monitor for progression.    Dr. Emily Filbert is Medical Director for [facility].  Dr. Ottie Glazier is Medical Director for [facility].

## 2021-02-25 ENCOUNTER — Other Ambulatory Visit: Payer: Self-pay

## 2021-02-25 DIAGNOSIS — Z955 Presence of coronary angioplasty implant and graft: Secondary | ICD-10-CM | POA: Diagnosis not present

## 2021-02-25 NOTE — Progress Notes (Signed)
Daily Session Note  Patient Details  Name: Wynne Rozak MRN: [id number] Date of Birth: 1956-03-16 Referring Provider:   Flowsheet Row Cardiac Rehab from 11/27/2020 in [facility] Cardiac and Pulmonary Rehab  Referring Provider Serafina Royals MD       Encounter Date: 02/25/2021  Check In:  Session Check In - 02/25/21 1537       Check-In   Supervising physician immediately available to respond to emergencies See telemetry face sheet for immediately available ER MD    Location [facility]    Staff Present Birdie Sons, MPA, Nino Glow, MS, ASCM CEP, Exercise Physiologist;Joseph Tessie Fass, [place]    Virtual Visit No    Medication changes reported     No    Fall or balance concerns reported    No    Warm-up and Cool-down Performed on first and last piece of equipment    Resistance Training Performed Yes    VAD Patient? No    PAD/SET Patient? No      Pain Assessment   Currently in Pain? No/denies                Social History   Tobacco Use  Smoking Status Never  Smokeless Tobacco Never    Goals Met:  Independence with exercise equipment Exercise tolerated well No report of cardiac concerns or symptoms Strength training completed today  Goals Unmet:  Not Applicable  Comments: Pt able to follow exercise prescription today without complaint.  Will continue to monitor for progression.    Dr. Emily Filbert is Medical Director for [place].  Dr. Ottie Glazier is Medical Director for [facility].

## 2021-03-02 ENCOUNTER — Other Ambulatory Visit: Payer: Self-pay

## 2021-03-02 DIAGNOSIS — Z955 Presence of coronary angioplasty implant and graft: Secondary | ICD-10-CM | POA: Diagnosis not present

## 2021-03-02 NOTE — Progress Notes (Signed)
Daily Session Note  Patient Details  Name: Felicia Woods MRN: [id number] Date of Birth: [birth year] Referring Provider:   Flowsheet Row Cardiac Rehab from 11/27/2020 in [facility] Cardiac and Pulmonary Rehab  Referring Provider Serafina Royals MD       Encounter Date: 03/02/2021  Check In:  Session Check In - 03/02/21 1542       Check-In   Supervising physician immediately available to respond to emergencies See telemetry face sheet for immediately available ER MD    Location [facility]    Staff Present Birdie Sons, MPA, Nino Glow, MS, ASCM CEP, Exercise Physiologist;Meredith Sherryll Burger, RN BSN    Virtual Visit No    Medication changes reported     No    Fall or balance concerns reported    No    Warm-up and Cool-down Performed on first and last piece of equipment    Resistance Training Performed Yes    VAD Patient? No    PAD/SET Patient? No      Pain Assessment   Currently in Pain? No/denies                Social History   Tobacco Use  Smoking Status Never  Smokeless Tobacco Never    Goals Met:  Independence with exercise equipment Exercise tolerated well No report of concerns or symptoms today Strength training completed today  Goals Unmet:  Not Applicable  Comments: Pt able to follow exercise prescription today without complaint.  Will continue to monitor for progression.    Dr. Emily Filbert is Medical Director for [facility].  Dr. Ottie Glazier is Medical Director for [facility].

## 2021-03-04 ENCOUNTER — Other Ambulatory Visit: Payer: Self-pay

## 2021-03-04 DIAGNOSIS — Z955 Presence of coronary angioplasty implant and graft: Secondary | ICD-10-CM

## 2021-03-04 NOTE — Progress Notes (Signed)
Daily Session Note  Patient Details  Name: Cybil Benedick MRN: [id number] Date of Birth: 09/15/1955 Referring Provider:   Flowsheet Row Cardiac Rehab from 11/27/2020 in [facility] Cardiac and Pulmonary Rehab  Referring Provider Kowalski, Bruce MD       Encounter Date: 03/04/2021  Check In:  Session Check In - 03/04/21 1529       Check-In   Supervising physician immediately available to respond to emergencies See telemetry face sheet for immediately available ER MD    Location [facility]    Staff Present Kelly Bollinger, MPA, RN;Kara Langdon, MS, ASCM CEP, Exercise Physiologist;Joseph Hood, RCP,RRT,BSRT    Virtual Visit No    Medication changes reported     No    Fall or balance concerns reported    No    Warm-up and Cool-down Performed on first and last piece of equipment    Resistance Training Performed Yes    VAD Patient? No    PAD/SET Patient? No      Pain Assessment   Currently in Pain? No/denies                Social History   Tobacco Use  Smoking Status Never  Smokeless Tobacco Never    Goals Met:  Independence with exercise equipment Exercise tolerated well No report of concerns or symptoms today Strength training completed today  Goals Unmet:  Not Applicable  Comments: Pt able to follow exercise prescription today without complaint.  Will continue to monitor for progression.    Dr. Mark Miller is Medical Director for [facility].  Dr. Fuad Aleskerov is Medical Director for [facility]. 

## 2021-03-05 ENCOUNTER — Emergency Department
Admission: EM | Admit: 2021-03-05 | Discharge: 2021-03-05 | Discharge status: Against Medical Advice | Payer: Medicare Other

## 2021-03-05 ENCOUNTER — Ambulatory Visit: Payer: Medicare Other

## 2021-03-06 ENCOUNTER — Other Ambulatory Visit: Payer: Self-pay

## 2021-03-06 ENCOUNTER — Emergency Department
Admission: EM | Admit: 2021-03-06 | Discharge: 2021-03-06 | Disposition: A | Discharge status: Home / Self Care | Payer: Medicare Other | Attending: Emergency Medicine | Admitting: Emergency Medicine

## 2021-03-06 ENCOUNTER — Emergency Department: Payer: Medicare Other

## 2021-03-06 DIAGNOSIS — I5033 Acute on chronic diastolic (congestive) heart failure: Secondary | ICD-10-CM | POA: Insufficient documentation

## 2021-03-06 DIAGNOSIS — Z79899 Other long term (current) drug therapy: Secondary | ICD-10-CM | POA: Insufficient documentation

## 2021-03-06 DIAGNOSIS — Z7902 Long term (current) use of antithrombotics/antiplatelets: Secondary | ICD-10-CM | POA: Insufficient documentation

## 2021-03-06 DIAGNOSIS — Z7982 Long term (current) use of aspirin: Secondary | ICD-10-CM | POA: Insufficient documentation

## 2021-03-06 DIAGNOSIS — I2510 Atherosclerotic heart disease of native coronary artery without angina pectoris: Secondary | ICD-10-CM | POA: Diagnosis not present

## 2021-03-06 DIAGNOSIS — I110 Hypertensive heart disease with heart failure: Secondary | ICD-10-CM | POA: Insufficient documentation

## 2021-03-06 DIAGNOSIS — E1169 Type 2 diabetes mellitus with other specified complication: Secondary | ICD-10-CM | POA: Insufficient documentation

## 2021-03-06 DIAGNOSIS — Z7951 Long term (current) use of inhaled steroids: Secondary | ICD-10-CM | POA: Diagnosis not present

## 2021-03-06 DIAGNOSIS — R0602 Shortness of breath: Secondary | ICD-10-CM | POA: Insufficient documentation

## 2021-03-06 DIAGNOSIS — R109 Unspecified abdominal pain: Secondary | ICD-10-CM

## 2021-03-06 DIAGNOSIS — E876 Hypokalemia: Secondary | ICD-10-CM | POA: Diagnosis not present

## 2021-03-06 DIAGNOSIS — J45909 Unspecified asthma, uncomplicated: Secondary | ICD-10-CM | POA: Diagnosis not present

## 2021-03-06 DIAGNOSIS — Z951 Presence of aortocoronary bypass graft: Secondary | ICD-10-CM | POA: Diagnosis not present

## 2021-03-06 LAB — URINALYSIS, COMPLETE (UACMP) WITH MICROSCOPIC
Bacteria, UA: NONE SEEN
Bilirubin Urine: NEGATIVE
Glucose, UA: >=500 mg/dL — AB
Hgb urine dipstick: NEGATIVE
Ketones, ur: NEGATIVE mg/dL
Leukocytes,Ua: NEGATIVE
Nitrite: NEGATIVE
Protein, ur: NEGATIVE mg/dL
Specific Gravity, Urine: 1.031 — ABNORMAL HIGH (ref 1.005–1.030)
WBC, UA: NONE SEEN WBC/hpf (ref 0–5)
pH: 5 (ref 5.0–8.0)

## 2021-03-06 LAB — BASIC METABOLIC PANEL
Anion gap: 8 (ref 5–15)
BUN: 9 mg/dL (ref 8–23)
CO2: 27 mmol/L (ref 22–32)
Calcium: 8.8 mg/dL — ABNORMAL LOW (ref 8.9–10.3)
Chloride: 101 mmol/L (ref 98–111)
Creatinine, Ser: 0.73 mg/dL (ref 0.44–1.00)
GFR, Estimated: >60 mL/min (ref >60)
Glucose, Bld: 196 mg/dL — ABNORMAL HIGH (ref 70–99)
Potassium: 2.8 mmol/L — ABNORMAL LOW (ref 3.5–5.1)
Sodium: 136 mmol/L (ref 135–145)

## 2021-03-06 LAB — CBC
HCT: 35.2 % — ABNORMAL LOW (ref 36.0–46.0)
Hemoglobin: 11.3 g/dL — ABNORMAL LOW (ref 12.0–15.0)
MCH: 24.4 pg — ABNORMAL LOW (ref 26.0–34.0)
MCHC: 32.1 g/dL (ref 30.0–36.0)
MCV: 76 fL — ABNORMAL LOW (ref 80.0–100.0)
Platelets: 251 10*3/uL (ref 150–400)
RBC: 4.63 MIL/uL (ref 3.87–5.11)
RDW: 18.9 % — ABNORMAL HIGH (ref 11.5–15.5)
WBC: 10.7 10*3/uL — ABNORMAL HIGH (ref 4.0–10.5)
nRBC: 0 % (ref 0.0–0.2)

## 2021-03-06 LAB — TROPONIN I (HIGH SENSITIVITY)
Troponin I (High Sensitivity): 7 ng/L (ref <18)
Troponin I (High Sensitivity): 6 ng/L (ref <18)

## 2021-03-06 MED ORDER — POTASSIUM CHLORIDE CRYS ER 20 MEQ PO TBCR
40.0000 meq | EXTENDED_RELEASE_TABLET | Freq: Once | ORAL | Status: AC
  Administered 2021-03-06: 40 meq via ORAL
  Filled 2021-03-06: qty 2

## 2021-03-06 MED ORDER — METHOCARBAMOL 500 MG PO TABS
500.0000 mg | ORAL_TABLET | Freq: Once | ORAL | Status: AC
  Administered 2021-03-06: 500 mg via ORAL
  Filled 2021-03-06 (×2): qty 1

## 2021-03-06 MED ORDER — KETOROLAC TROMETHAMINE 30 MG/ML IJ SOLN
30.0000 mg | Freq: Once | INTRAMUSCULAR | Status: AC
  Administered 2021-03-06: 30 mg via INTRAMUSCULAR
  Filled 2021-03-06: qty 1

## 2021-03-06 MED ORDER — IPRATROPIUM-ALBUTEROL 0.5-2.5 (3) MG/3ML IN SOLN
3.0000 mL | Freq: Once | RESPIRATORY_TRACT | Status: AC
  Administered 2021-03-06: 3 mL via RESPIRATORY_TRACT
  Filled 2021-03-06: qty 3

## 2021-03-06 MED ORDER — LIDOCAINE 5 % EX PTCH
1.0000 | MEDICATED_PATCH | Freq: Once | CUTANEOUS | Status: DC
  Administered 2021-03-06: 1 via TRANSDERMAL
  Filled 2021-03-06: qty 1

## 2021-03-06 MED ORDER — OXYCODONE HCL 5 MG PO TABS
5.0000 mg | ORAL_TABLET | Freq: Once | ORAL | Status: AC
  Administered 2021-03-06: 5 mg via ORAL
  Filled 2021-03-06: qty 1

## 2021-03-06 MED ORDER — ACETAMINOPHEN 500 MG PO TABS
1000.0000 mg | ORAL_TABLET | Freq: Once | ORAL | Status: AC
  Administered 2021-03-06: 1000 mg via ORAL
  Filled 2021-03-06: qty 2

## 2021-03-06 MED ORDER — LIDOCAINE 5 % EX PTCH: 1.0000 | MEDICATED_PATCH | Freq: Two times a day (BID) | CUTANEOUS | 0 refills | Status: DC

## 2021-03-06 MED ORDER — POTASSIUM CHLORIDE 20 MEQ PO PACK
40.0000 meq | PACK | Freq: Once | ORAL | Status: AC
  Administered 2021-03-06: 40 meq via ORAL
  Filled 2021-03-06: qty 2

## 2021-03-06 NOTE — ED Notes (Signed)
Unsuccessful attempt @ blood draw.

## 2021-03-06 NOTE — ED Notes (Signed)
Lab at bedside for repeat trop.

## 2021-03-06 NOTE — Discharge Instructions (Addendum)
Use Tylenol for pain and fevers.  Up to 1000 mg per dose, up to 4 times per day.  Do not take more than 4000 mg of Tylenol/acetaminophen within 24 hours..  

## 2021-03-06 NOTE — ED Provider Notes (Signed)
[facility] Emergency Department Provider Note ____________________________________________   Event Date/Time   First MD Initiated Contact with Patient 03/06/21 [phone]     (approximate)  I have reviewed the triage vital signs and the nursing notes.  HISTORY  Chief Complaint Flank Pain and Shortness of Breath   HPI Felicia Woods is a 65 y.o. femalewho presents to the ED for evaluation of flank pain and shortness of breath.  Chart review indicates history of HTN, HLD, DM, diastolic CHF and CAD/CABG in 2017 stents as recently as 4 months ago (to ostium and mid SVG-OM).  DAPT with Plavix.  Seen yesterday at the adjacent walk-in clinic for same complaints, and referred to the ED, but she left without being seen and returns this morning.  Patient presents to the ED for the ration of 2 days of constant sharp and stabbing right-sided flank pain.  She reports waking up with the symptoms a couple days ago, and they have been constant and persistent since that time.  Nonradiating sharp pains to her right flank that she has never felt before.  Worse with movement and getting out of bed, improved with laying still.  She reports taken Tylenol and ibuprofen with no significant improvement.  Denies fevers, cough, shortness of breath, chest pain substernally, syncopal episodes, emesis, diarrhea.  Past Medical History:  Diagnosis Date  . Asthma   . Coronary artery disease   . Diabetes mellitus without complication ([place])   . Heart attack ([place])   . Hyperlipidemia   . Hypertension   . MI (myocardial infarction) ([place])   . Migraine headache with aura   . 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    Patient Active Problem List   Diagnosis Date Noted  . Stable angina ([place]) 10/29/2020  . Acute non-recurrent maxillary sinusitis 08/01/2020  . Cough 08/01/2020  . Pain in joint of left knee 07/17/2020  . Uncontrolled type 2 diabetes mellitus with hyperglycemia ([place]) 03/25/2020  .  Neck pain 03/25/2020  . Other insomnia 03/25/2020  . BMI 39.0-39.9,adult 03/25/2020  . Atherosclerosis of aorta ([place]) 03/19/2020  . Pain in right knee 03/04/2020  . Lumbar radiculopathy 03/04/2020  . Unstable angina ([place]) 02/13/2019  . Acute bilateral low back pain without sciatica 08/21/2018  . Asthma 08/21/2018  . Coronary artery disease involving coronary bypass graft of native heart without angina pectoris 08/21/2018  . Depression, major, single episode, in partial remission ([place]) 08/21/2018  . Chest pain 11/02/2017  . S/P drug eluting coronary stent placement 02/16/2017  . Numbness and tingling 12/01/2016  . Chronic tension-type headache, intractable 12/01/2016  . Osteoarthritis of knee 11/12/2016  . Obesity, Class II, BMI 35-39.9 05/13/2016  . Hx of CABG 2010 11/12/2015  . Type 2 diabetes mellitus with circulatory disorder, without long-term current use of insulin ([place]) 11/12/2015  . Morbid obesity ([place]) 11/12/2015  . SOBOE (shortness of breath on exertion) 11/12/2015  . RBBB 11/12/2015  . ST elevation myocardial infarction (STEMI) of inferior wall ([place]) 11/03/2015  . SVG-RCA throbectomy and DES 10/18/15   . Hypertensive heart disease without heart failure   . Vertigo   . STEMI 10/18/15 10/18/2015  . NSTEMI (non-ST elevated myocardial infarction) ([place]) 10/18/2015  . Intractable chronic migraine without aura and without status migrainosus 05/08/2015  . Essential hypertension 03/17/2015  . Acute on chronic diastolic CHF (congestive heart failure), NYHA class 3 ([place]) 08/26/2014  . CAD (coronary artery disease) 08/26/2014  . Headache 08/06/2014  . Mixed hyperlipidemia 07/30/2014  Past Surgical History:  Procedure Laterality Date  . ABDOMINAL HYSTERECTOMY    . CARDIAC CATHETERIZATION    . CARDIAC CATHETERIZATION N/A 10/18/2015   Procedure: Left Heart Cath and Cors/Grafts Angiography;  Surgeon: Lorretta Harp, MD;  Location: [place] [facility];  Service: Cardiovascular;   Laterality: N/A;  . CARDIAC CATHETERIZATION N/A 10/18/2015   Procedure: Coronary Stent Intervention;  Surgeon: Lorretta Harp, MD;  Location: [place] [facility];  Service: Cardiovascular;  Laterality: N/A;  . CARDIAC SURGERY    . CHOLECYSTECTOMY    . CORONARY ANGIOPLASTY    . CORONARY ARTERY BYPASS GRAFT     4 vessels - 2010  . CORONARY STENT INTERVENTION N/A 02/16/2017   Procedure: CORONARY STENT INTERVENTION;  Surgeon: Yolonda Kida, MD;  Location: [place] [facility];  Service: Cardiovascular;  Laterality: N/A;  . CORONARY STENT INTERVENTION N/A 02/13/2019   Procedure: CORONARY STENT INTERVENTION;  Surgeon: Nelva Bush, MD;  Location: [place] [facility];  Service: Cardiovascular;  Laterality: N/A;  SVG to RCA  . CORONARY STENT INTERVENTION Left 03/08/2019   Procedure: CORONARY STENT INTERVENTION;  Surgeon: Yolonda Kida, MD;  Location: [place] [facility];  Service: Cardiovascular;  Laterality: Left;  . CORONARY STENT INTERVENTION N/A 11/04/2020   Procedure: CORONARY STENT INTERVENTION;  Surgeon: Nelva Bush, MD;  Location: [place] [facility];  Service: Cardiovascular;  Laterality: N/A;  . LEFT HEART CATH AND CORONARY ANGIOGRAPHY Left 02/16/2017   Procedure: LEFT HEART CATH AND CORONARY ANGIOGRAPHY;  Surgeon: Corey Skains, MD;  Location: [place] [facility];  Service: Cardiovascular;  Laterality: Left;  . LEFT HEART CATH AND CORS/GRAFTS ANGIOGRAPHY Left 11/23/2017   Procedure: LEFT HEART CATH AND CORS/GRAFTS ANGIOGRAPHY;  Surgeon: Corey Skains, MD;  Location: [place] [facility];  Service: Cardiovascular;  Laterality: Left;  . LEFT HEART CATH AND CORS/GRAFTS ANGIOGRAPHY N/A 02/13/2019   Procedure: LEFT HEART CATH AND CORS/GRAFTS ANGIOGRAPHY;  Surgeon: Corey Skains, MD;  Location: [place] [facility];  Service: Cardiovascular;  Laterality: N/A;  . LEFT HEART CATH AND CORS/GRAFTS ANGIOGRAPHY N/A 07/03/2019   Procedure: LEFT HEART CATH AND CORS/GRAFTS  ANGIOGRAPHY;  Surgeon: Corey Skains, MD;  Location: [place] [facility];  Service: Cardiovascular;  Laterality: N/A;  . LEFT HEART CATH AND CORS/GRAFTS ANGIOGRAPHY N/A 11/04/2020   Procedure: LEFT HEART CATH AND CORS/GRAFTS ANGIOGRAPHY;  Surgeon: Corey Skains, MD;  Location: [place] [facility];  Service: Cardiovascular;  Laterality: N/A;    Prior to Admission medications   Medication Sig Start Date End Date Taking? Authorizing Provider  lidocaine (LIDODERM) 5 % Place 1 patch onto the skin every 12 (twelve) hours. Remove & Discard patch within 12 hours or as directed by MD 03/06/21 03/06/22 Yes Vladimir Crofts, MD  Accu-Chek Softclix Lancets lancets Use as instructed twice a day to check blood sugars  DIAG -E11.59 08/20/20   Luiz Ochoa, NP  albuterol (PROVENTIL HFA;VENTOLIN HFA) 108 (90 Base) MCG/ACT inhaler Inhale 2 puffs into the lungs every 6 (six) hours as needed for wheezing or shortness of breath.    [provider]  amLODipine (NORVASC) 10 MG tablet Take 1 tablet (10 mg total) by mouth daily. 08/20/20   Luiz Ochoa, NP  aspirin EC 81 MG tablet Take 81 mg by mouth daily.    [provider]  atorvastatin (LIPITOR) 80 MG tablet Take 1 tablet (80 mg total) by mouth daily. 11/05/20 11/05/21  Corey Skains, MD  canagliflozin (  INVOKANA) 300 MG TABS tablet Take 1 tablet (300 mg total) by mouth daily before breakfast. 11/17/20   McDonough, Si Gaul, PA-C  clopidogrel (PLAVIX) 75 MG tablet Take 1 tablet (75 mg total) by mouth daily. 03/06/20   Ronnell Freshwater, NP  dapagliflozin propanediol (FARXIGA) 10 MG TABS tablet Farxiga 10 mg tablet    [provider]  Dulaglutide (TRULICITY) 1.5 QB/3.4LP SOPN INJECT 1.5MG INTO THE SKIN ONCE A WEEK 12/19/20   Lavera Guise, MD  famotidine (PEPCID) 20 MG tablet Take 1 tablet (20 mg total) by mouth 2 (two) times daily. 02/19/21   McDonough, Si Gaul, PA-C  furosemide (LASIX) 40 MG tablet Take 1 tablet (40 mg total) by mouth 2  (two) times daily. 08/20/20   Luiz Ochoa, NP  glucose blood (ACCU-CHEK GUIDE) test strip Use as instructed to check blood sugars twice a day  E11.59 08/20/20   Luiz Ochoa, NP  isosorbide mononitrate (IMDUR) 60 MG 24 hr tablet Take 60 mg by mouth daily.    [provider]  losartan (COZAAR) 100 MG tablet Take 1 tablet (100 mg total) by mouth daily. Take one tab po qd 08/20/20   Luiz Ochoa, NP  metoprolol succinate (TOPROL-XL) 25 MG 24 hr tablet Take 25 mg by mouth daily.    [provider]  montelukast (SINGULAIR) 10 MG tablet Take 1 tablet (10 mg total) by mouth at bedtime. 02/04/20   Lavera Guise, MD  neomycin-polymyxin-dexamethasone (MAXITROL) 0.1 % ophthalmic suspension neomycin-polymyxin-dexameth 3.5 mg/mL-10,000 unit/mL-0.1% eye drops  INSTILL 1 DROP INTO EACH EYE 4 TIMES DAILY FOR 7 DAYS    [provider]  nitroGLYCERIN (NITROSTAT) 0.4 MG SL tablet Place 1 tablet (0.4 mg total) under the tongue every 5 (five) minutes x 3 doses as needed for chest pain. *If no relief call MD or go to Emergency Room* 12/19/20   Lavera Guise, MD  nitroGLYCERIN (NITROSTAT) 0.4 MG SL tablet Place under the tongue.    [provider]  oxybutynin (DITROPAN-XL) 5 MG 24 hr tablet Take 1 tablet (5 mg total) by mouth daily with supper. 02/04/20   Lavera Guise, MD  ranolazine (RANEXA) 500 MG 12 hr tablet Take 1 tablet by mouth 2 (two) times daily. 04/01/20 04/01/21  [provider]  tiZANidine (ZANAFLEX) 2 MG tablet Take 1 tablet (2 mg total) by mouth at bedtime. 02/19/21   McDonough, Si Gaul, PA-C  zolpidem (AMBIEN) 10 MG tablet Take 1 tablet (10 mg total) by mouth at bedtime as needed for sleep. 02/19/21   McDonough, Si Gaul, PA-C    Allergies Penicillin g  Family History  Problem Relation Age of Onset  . Diabetes Mother   . Diabetes Father   . Cancer Father   . Diabetes Brother     Social History Social History   Tobacco Use  . Smoking status: Never   . Smokeless tobacco: Never  Vaping Use  . Vaping Use: Never used  Substance Use Topics  . Alcohol use: No    Alcohol/week: 0.0 standard drinks  . Drug use: No    Review of Systems  Constitutional: No fever/chills Eyes: No visual changes. ENT: No sore throat. Cardiovascular: Denies chest pain. Respiratory: Denies shortness of breath. Gastrointestinal: No abdominal pain.  No nausea, no vomiting.  No diarrhea.  No constipation. Genitourinary: Negative for dysuria. Musculoskeletal: Negative for back pain. Positive for atraumatic right-sided flank pain. Skin: Negative for rash. Neurological: Negative for headaches, focal weakness  or numbness.  ____________________________________________   PHYSICAL EXAM:  VITAL SIGNS: Vitals:   03/06/21 1130 03/06/21 1200  BP: 101/66 107/74  Pulse: (!) 55 (!) 58  Resp: 11 11  Temp:    SpO2: 94% 96%     Constitutional: Alert and oriented. Well appearing and in no acute distress. Eyes: Conjunctivae are normal. PERRL. EOMI. Head: Atraumatic. Nose: No congestion/rhinnorhea. Mouth/Throat: Mucous membranes are moist.  Oropharynx non-erythematous. Neck: No stridor. No cervical spine tenderness to palpation. Cardiovascular: Normal rate, regular rhythm. Grossly normal heart sounds.  Good peripheral circulation. Respiratory: Normal respiratory effort.  No retractions. Lungs CTAB. Gastrointestinal: Soft , nondistended, nontender to palpation. No CVA tenderness.  Benign abdomen, particularly to the RUQ and right-sided abdomen. Musculoskeletal: No lower extremity tenderness nor edema.  No joint effusions. No signs of acute trauma. Tenderness to palpation to the site of pain, her right-sided lateral flank, around T8/T10.  No overlying skin changes, vesicles or stigmata of shingles.  No signs of trauma. Neurologic:  Normal speech and language. No gross focal neurologic deficits are appreciated. No gait instability noted. Skin:  Skin is warm, dry and  intact. No rash noted. Psychiatric: Mood and affect are normal. Speech and behavior are normal.  ____________________________________________   LABS (all labs ordered are listed, but only abnormal results are displayed)  Labs Reviewed  URINALYSIS, COMPLETE (UACMP) WITH MICROSCOPIC - Abnormal; Notable for the following components:      Result Value   Color, Urine YELLOW (*)    APPearance CLOUDY (*)    Specific Gravity, Urine 1.031 (*)    Glucose, UA >=500 (*)    Non Squamous Epithelial PRESENT (*)    All other components within normal limits  BASIC METABOLIC PANEL - Abnormal; Notable for the following components:   Potassium 2.8 (*)    Glucose, Bld 196 (*)    Calcium 8.8 (*)    All other components within normal limits  CBC - Abnormal; Notable for the following components:   WBC 10.7 (*)    Hemoglobin 11.3 (*)    HCT 35.2 (*)    MCV 76.0 (*)    MCH 24.4 (*)    RDW 18.9 (*)    All other components within normal limits  TROPONIN I (HIGH SENSITIVITY)  TROPONIN I (HIGH SENSITIVITY)   ____________________________________________  12 Lead EKG  Sinus rhythm with a rate of 73 bpm.  Normal axis.  Right bundle.  No evidence of acute ischemia. ____________________________________________  RADIOLOGY  ED MD interpretation: Low lung volumes with right basilar atelectasis versus infiltrate.  Right hemidiaphragm elevation slightly  Official radiology report(s): DG Chest 2 View  Result Date: 03/06/2021 CLINICAL DATA:  Shortness of breath and right-sided pain EXAM: CHEST - 2 VIEW COMPARISON:  07/02/2019 FINDINGS: Opacity at both lung bases with mild volume loss on the right. No discrete infarct type appearance. Cardiomegaly. Prior CABG. No edema, effusion, or pneumothorax. IMPRESSION: Atelectasis or pneumonia at the lung bases, greater on the symptomatic right side. Electronically Signed   By: Monte Fantasia M.D.   On: 03/06/2021 08:18     ____________________________________________   PROCEDURES and INTERVENTIONS  Procedure(s) performed (including Critical Care):  .1-3 Lead EKG Interpretation  Date/Time: 03/06/2021 9:27 AM Performed by: Vladimir Crofts, MD Authorized by: Vladimir Crofts, MD     Interpretation: normal     ECG rate:  70   ECG rate assessment: normal     Rhythm: sinus rhythm     Ectopy: none     Conduction:  normal    Medications  lidocaine (LIDODERM) 5 % 1 patch (1 patch Transdermal Patch Applied 03/06/21 0829)  acetaminophen (TYLENOL) tablet 1,000 mg (1,000 mg Oral Given 03/06/21 0929)  methocarbamol (ROBAXIN) tablet 500 mg (500 mg Oral Given 03/06/21 0829)  potassium chloride (KLOR-CON) packet 40 mEq (40 mEq Oral Given 03/06/21 0829)  ketorolac (TORADOL) 30 MG/ML injection 30 mg (30 mg Intramuscular Given 03/06/21 0930)  oxyCODONE (Oxy IR/ROXICODONE) immediate release tablet 5 mg (5 mg Oral Given 03/06/21 0929)  ipratropium-albuterol (DUONEB) 0.5-2.5 (3) MG/3ML nebulizer solution 3 mL (3 mLs Nebulization Given 03/06/21 0938)  potassium chloride SA (KLOR-CON) CR tablet 40 mEq (40 mEq Oral Given 03/06/21 0950)    ____________________________________________   MDM / ED COURSE   65 year old female with a strong history of coronary disease presents to the ED with right-sided flank pains, likely muscular in etiology, and ultimately amenable to outpatient management.  Normal vitals.  Exam with reproducible pain on gentle palpation, without overlying skin changes or stigmata of shingles.  Besides her tenderness, she looks well and has no evidence of acute pathology.  Work-up is reassuring without evidence of PTX, ACS.  Does have hypokalemia, that was repleted orally.  No stigmata of UTI and less likely ureterolithiasis.  Improved pain with multimodal analgesia.  We will discharge with return precautions.  Clinical Course as of 03/06/21 1236  Fri Mar 06, 2021  0092 Discussed with the patient my suspicion for muscular spasm  caused by hypokalemia causing her pain.  We discussed possible other etiologies.  We discussed management and she is in agreement with plan of care. [DS]  0940 Reassessed.  Minimal improvement with lidocaine patches and Robaxin.  We discussed CXR results and she again denies any cough, shortness of breath or symptoms to suggest pneumonia.  We discussed breathing treatments, Discher medications and reevaluation.  She is in agreement. [DS]  0041 [id number].  Patient reports feeling a little bit better. [DS]  0940 Reassessed.  Patient reports feeling much better.  We discussed outpatient management and return precautions for the ED. [DS]    Clinical Course User Index [DS] Vladimir Crofts, MD    ____________________________________________   FINAL CLINICAL IMPRESSION(S) / ED DIAGNOSES  Final diagnoses:  Right flank pain  Hypokalemia     ED Discharge Orders          Ordered    lidocaine (LIDODERM) 5 %  Every 12 hours        03/06/21 1236             Georgana Romain   Note:  This document was prepared using Systems analyst and may include unintentional dictation errors.    Vladimir Crofts, MD 03/06/21 [phone]

## 2021-03-06 NOTE — ED Triage Notes (Signed)
Pt arrives via pov with c/o Sob and right sided pain radiating up under ribcage starting 2 days ago. Pt denies any n/v/d. Denies any pain or difficulty urinating, no blood noted in urine. NAD noted at this time.

## 2021-03-06 NOTE — ED Notes (Signed)
ED Provider at bedside. 

## 2021-03-11 ENCOUNTER — Encounter: Payer: Self-pay | Admitting: *Deleted

## 2021-03-11 ENCOUNTER — Ambulatory Visit: Payer: Medicare Other | Admitting: Nurse Practitioner

## 2021-03-11 ENCOUNTER — Ambulatory Visit: Payer: Medicare Other

## 2021-03-11 DIAGNOSIS — Z0289 Encounter for other administrative examinations: Secondary | ICD-10-CM

## 2021-03-11 DIAGNOSIS — Z955 Presence of coronary angioplasty implant and graft: Secondary | ICD-10-CM

## 2021-03-11 NOTE — Progress Notes (Signed)
Cardiac Individual Treatment Plan  Patient Details  Name: Felicia Woods MRN: [id number] Date of Birth: [birth year] Referring Provider:   Flowsheet Row Cardiac Rehab from 11/27/2020 in [facility] Cardiac and Pulmonary Rehab  Referring Provider Serafina Royals MD       Initial Encounter Date:  Flowsheet Row Cardiac Rehab from 11/27/2020 in [facility] Cardiac and Pulmonary Rehab  Date 11/27/20       Visit Diagnosis: S/P coronary artery stent placement  Patient's Home Medications on Admission:  Current Outpatient Medications:    Accu-Chek Softclix Lancets lancets, Use as instructed twice a day to check blood sugars  DIAG -E11.59, Disp: 100 each, Rfl: 3   albuterol (PROVENTIL HFA;VENTOLIN HFA) 108 (90 Base) MCG/ACT inhaler, Inhale 2 puffs into the lungs every 6 (six) hours as needed for wheezing or shortness of breath., Disp: , Rfl:    amLODipine (NORVASC) 10 MG tablet, Take 1 tablet (10 mg total) by mouth daily., Disp: 90 tablet, Rfl: 1   aspirin EC 81 MG tablet, Take 81 mg by mouth daily., Disp: , Rfl:    atorvastatin (LIPITOR) 80 MG tablet, Take 1 tablet (80 mg total) by mouth daily., Disp: 30 tablet, Rfl: 2   canagliflozin (INVOKANA) 300 MG TABS tablet, Take 1 tablet (300 mg total) by mouth daily before breakfast., Disp: 30 tablet, Rfl: 2   clopidogrel (PLAVIX) 75 MG tablet, Take 1 tablet (75 mg total) by mouth daily., Disp: 90 tablet, Rfl: 3   dapagliflozin propanediol (FARXIGA) 10 MG TABS tablet, Farxiga 10 mg tablet, Disp: , Rfl:    Dulaglutide (TRULICITY) 1.5 TX/6.4WO SOPN, INJECT 1.5MG INTO THE SKIN ONCE A WEEK, Disp: 12 mL, Rfl: 1   famotidine (PEPCID) 20 MG tablet, Take 1 tablet (20 mg total) by mouth 2 (two) times daily., Disp: 60 tablet, Rfl: 2   furosemide (LASIX) 40 MG tablet, Take 1 tablet (40 mg total) by mouth 2 (two) times daily., Disp: 180 tablet, Rfl: 1   glucose blood (ACCU-CHEK GUIDE) test strip, Use as instructed to check blood sugars twice a day  E11.59, Disp: 100 each, Rfl:  3   isosorbide mononitrate (IMDUR) 60 MG 24 hr tablet, Take 60 mg by mouth daily., Disp: , Rfl:    lidocaine (LIDODERM) 5 %, Place 1 patch onto the skin every 12 (twelve) hours. Remove & Discard patch within 12 hours or as directed by MD, Disp: 10 patch, Rfl: 0   losartan (COZAAR) 100 MG tablet, Take 1 tablet (100 mg total) by mouth daily. Take one tab po qd, Disp: 90 tablet, Rfl: 1   metoprolol succinate (TOPROL-XL) 25 MG 24 hr tablet, Take 25 mg by mouth daily., Disp: , Rfl:    montelukast (SINGULAIR) 10 MG tablet, Take 1 tablet (10 mg total) by mouth at bedtime., Disp: 90 tablet, Rfl: 3   neomycin-polymyxin-dexamethasone (MAXITROL) 0.1 % ophthalmic suspension, neomycin-polymyxin-dexameth 3.5 mg/mL-10,000 unit/mL-0.1% eye drops  INSTILL 1 DROP INTO EACH EYE 4 TIMES DAILY FOR 7 DAYS, Disp: , Rfl:    nitroGLYCERIN (NITROSTAT) 0.4 MG SL tablet, Place 1 tablet (0.4 mg total) under the tongue every 5 (five) minutes x 3 doses as needed for chest pain. *If no relief call MD or go to Emergency Room*, Disp: 30 tablet, Rfl: 0   nitroGLYCERIN (NITROSTAT) 0.4 MG SL tablet, Place under the tongue., Disp: , Rfl:    oxybutynin (DITROPAN-XL) 5 MG 24 hr tablet, Take 1 tablet (5 mg total) by mouth daily with supper., Disp: 90 tablet, Rfl: 2   ranolazine (  RANEXA) 500 MG 12 hr tablet, Take 1 tablet by mouth 2 (two) times daily., Disp: , Rfl:    tiZANidine (ZANAFLEX) 2 MG tablet, Take 1 tablet (2 mg total) by mouth at bedtime., Disp: 30 tablet, Rfl: 0   zolpidem (AMBIEN) 10 MG tablet, Take 1 tablet (10 mg total) by mouth at bedtime as needed for sleep., Disp: 30 tablet, Rfl: 2  Past Medical History: Past Medical History:  Diagnosis Date   Asthma    Coronary artery disease    Diabetes mellitus without complication ([place])    Heart attack ([place])    Hyperlipidemia    Hypertension    MI (myocardial infarction) ([place])    Migraine headache with aura    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    Tobacco Use: Social  History   Tobacco Use  Smoking Status Never  Smokeless Tobacco Never    Labs: Recent Review Flowsheet Data     Labs for ITP Cardiac and Pulmonary Rehab Latest Ref Rng & Units 11/13/2019 03/06/2020 08/20/2020 11/17/2020 02/19/2021   Cholestrol 100 - 199 mg/dL - - - - -   LDLCALC 0 - 99 mg/dL - - - - -   HDL >39 mg/dL - - - - -   Trlycerides 0 - 149 mg/dL - - - - -   Hemoglobin A1c 4.0 - 5.6 % 6.8(A) 6.9(A) 7.6(A) 7.5(A) 7.0(A)   TCO2 0 - 100 mmol/L - - - - -        Exercise Target Goals: Exercise Program Goal: Individual exercise prescription set using results from initial 6 min walk test and THRR while considering  patient's activity barriers and safety.   Exercise Prescription Goal: Initial exercise prescription builds to 30-45 minutes a day of aerobic activity, 2-3 days per week.  Home exercise guidelines will be given to patient during program as part of exercise prescription that the participant will acknowledge.   Education: Aerobic Exercise: - Group verbal and visual presentation on the components of exercise prescription. Introduces F.I.T.T principle from ACSM for exercise prescriptions.  Reviews F.I.T.T. principles of aerobic exercise including progression. Written material given at graduation.   Education: Resistance Exercise: - Group verbal and visual presentation on the components of exercise prescription. Introduces F.I.T.T principle from ACSM for exercise prescriptions  Reviews F.I.T.T. principles of resistance exercise including progression. Written material given at graduation.    Education: Exercise & Equipment Safety: - Individual verbal instruction and demonstration of equipment use and safety with use of the equipment. Flowsheet Row Cardiac Rehab from 11/27/2020 in [facility] Cardiac and Pulmonary Rehab  Date 11/27/20  Educator [place]  Instruction Review Code 1- Verbalizes Understanding       Education: Exercise Physiology & General Exercise Guidelines: - Group  verbal and written instruction with models to review the exercise physiology of the cardiovascular system and associated critical values. Provides general exercise guidelines with specific guidelines to those with heart or lung disease.    Education: Flexibility, Balance, Mind/Body Relaxation: - Group verbal and visual presentation with interactive activity on the components of exercise prescription. Introduces F.I.T.T principle from ACSM for exercise prescriptions. Reviews F.I.T.T. principles of flexibility and balance exercise training including progression. Also discusses the mind body connection.  Reviews various relaxation techniques to help reduce and manage stress (i.e. Deep breathing, progressive muscle relaxation, and visualization). Balance handout provided to take home. Written material given at graduation.   Activity Barriers & Risk Stratification:  Activity Barriers & Cardiac Risk Stratification - 11/27/20 1629  Activity Barriers & Cardiac Risk Stratification   Activity Barriers Back Problems;Joint Problems;Arthritis;Deconditioning;Muscular Weakness    Cardiac Risk Stratification High             6 Minute Walk:  6 Minute Walk     Row Name 11/27/20 1633         6 Minute Walk   Phase Initial     Distance 470 feet     Walk Time 6 minutes     # of Rest Breaks 0     MPH 0.89     METS 0.95     RPE 13     Perceived Dyspnea  0     VO2 Peak 3.35     Symptoms Yes (comment)     Comments Right knee- arthritis pain 10/10     Resting HR 66 bpm     Resting BP 104/62     Resting Oxygen Saturation  96 %     Exercise Oxygen Saturation  during 6 min walk 95 %     Max Ex. HR 89 bpm     Max Ex. BP 122/62     2 Minute Post BP 118/64              Oxygen Initial Assessment:   Oxygen Re-Evaluation:   Oxygen Discharge (Final Oxygen Re-Evaluation):   Initial Exercise Prescription:  Initial Exercise Prescription - 11/27/20 1600       Date of Initial Exercise  RX and Referring Provider   Date 11/27/20    Referring Provider Serafina Royals MD      Recumbant Bike   Level 1    RPM 60    Watts 15    Minutes 15    METs 1      NuStep   Level 1    SPM 80    Minutes 15    METs 1      Track   Laps 10    Minutes 15    METs 1      Prescription Details   Frequency (times per week) 3    Duration Progress to 30 minutes of continuous aerobic without signs/symptoms of physical distress      Intensity   THRR 40-80% of Max Heartrate 102-138    Ratings of Perceived Exertion 11-13    Perceived Dyspnea 0-4      Progression   Progression Continue to progress workloads to maintain intensity without signs/symptoms of physical distress.      Resistance Training   Training Prescription Yes    Weight 3 lb    Reps 10-15             Perform Capillary Blood Glucose checks as needed.  Exercise Prescription Changes:   Exercise Prescription Changes     Row Name 11/27/20 1600 12/10/20 1300 12/24/20 1500 01/06/21 1500 01/08/21 1600     Response to Exercise   Blood Pressure (Admit) 104/62 112/66 102/62 126/60 --   Blood Pressure (Exercise) 122/62 140/70 124/64 136/74 --   Blood Pressure (Exit) 118/64 110/62 104/70 132/76 --   Heart Rate (Admit) 66 bpm 59 bpm 74 bpm 68 bpm --   Heart Rate (Exercise) 89 bpm 103 bpm 76 bpm 101 bpm --   Heart Rate (Exit) 66 bpm 74 bpm 71 bpm 99 bpm --   Oxygen Saturation (Admit) 96 % -- -- -- --   Oxygen Saturation (Exercise) 95 % -- -- -- --   Oxygen Saturation (Exit) 96 % -- -- -- --  Rating of Perceived Exertion (Exercise) _0 --   Perceived Dyspnea (Exercise) 0 -- -- -- --   Symptoms R knee pain from arthritis 10/10 R knee pain -- R knee pain --   Comments walk test results first full day of exercise -- -- --   Duration -- Progress to 30 minutes of  aerobic without signs/symptoms of physical distress Progress to 30 minutes of  aerobic without signs/symptoms of physical distress Continue with 30 min  of aerobic exercise without signs/symptoms of physical distress. --   Intensity -- THRR unchanged THRR unchanged THRR unchanged --     Progression   Progression -- Continue to progress workloads to maintain intensity without signs/symptoms of physical distress. Continue to progress workloads to maintain intensity without signs/symptoms of physical distress. Continue to progress workloads to maintain intensity without signs/symptoms of physical distress. 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  Start with 1 day   Initial Home Exercises Provided -- -- -- -- 01/08/21    Row Name 01/22/21 1100 02/03/21 1000 02/18/21 1500 03/02/21 1200       Response to Exercise   Blood Pressure (Admit) 122/66 110/60 108/64 130/72    Blood Pressure (Exercise) 122/72 126/68 126/72 --    Blood Pressure (Exit) 130/72 128/78 120/68 110/60    Heart Rate (Admit) 86 bpm 60 bpm 86 bpm 91 bpm    Heart Rate (Exercise) 97 bpm 110 bpm 114 bpm 115 bpm    Heart Rate (Exit) 73 bpm 90 bpm 87 bpm 95 bpm    Rating of Perceived Exertion (Exercise) _4 Symptoms none knee pain -- knee pain    Duration Continue with 30 min of aerobic exercise without signs/symptoms of physical distress. Continue with 30 min of aerobic exercise without signs/symptoms of physical distress. Continue with 30 min of aerobic exercise without signs/symptoms of physical distress. Continue with 30 min of  aerobic exercise without signs/symptoms of physical distress.    Intensity THRR unchanged THRR unchanged THRR unchanged THRR unchanged         Progression   Progression Continue to progress workloads to maintain intensity without signs/symptoms of physical distress. Continue to progress workloads to maintain intensity without signs/symptoms of physical distress. Continue to progress workloads to maintain intensity without signs/symptoms of physical distress. Continue to progress workloads to maintain intensity without signs/symptoms of physical distress.    Average METs 2.66 3 3.85 3.95         Resistance Training   Training Prescription Yes Yes Yes Yes    Weight 4 lb 4 lb 4 lb  4 lb    Reps 10-15 10-15 10-15 10-15         Interval Training   Interval Training No No No No         NuStep   Level 4 3 -- 4    Minutes 30 30 -- 30    METs 2.9 3 -- 3.5         REL-XR   Level -- -- 1 1    Minutes -- -- 30 30    METs -- -- 3.85 4.5         Biostep-RELP   Level -- 2 -- --    Minutes -- 30 -- --    METs -- 3 -- --         Home Exercise Plan   Plans to continue exercise at [employer] (comment)  [place] (comment)  [employer] (comment)  [place] (comment)  [place] videos    Frequency Add 2 additional days to program exercise sessions.  Start with 1 day Add 2 additional days to program exercise sessions.  Start with 1 day Add 2 additional days to program exercise sessions.  Start with 1 day Add 2 additional days to program exercise sessions.  Start with 1 day    Initial Home Exercises Provided 01/08/21 01/08/21 01/08/21 01/08/21             Exercise Comments:   Exercise Goals and Review:   Exercise Goals     Row Name 11/27/20 1636             Exercise Goals   Increase Physical Activity Yes       Intervention Provide advice, education, support  and counseling about physical activity/exercise needs.;Develop an individualized exercise prescription for aerobic and resistive training based on initial evaluation findings, risk stratification, comorbidities and participant's personal goals.       Expected Outcomes Short Term: Attend rehab on a regular basis to increase amount of physical activity.;Long Term: Add in home exercise to make exercise part of routine and to increase amount of physical activity.;Long Term: Exercising regularly at least 3-5 days a week.       Increase Strength and Stamina Yes       Intervention Provide advice, education, support and counseling about physical activity/exercise needs.;Develop an individualized exercise prescription for aerobic and resistive training based on initial evaluation findings, risk stratification, comorbidities and participant's personal goals.       Expected Outcomes Short Term: Increase workloads from initial exercise prescription for resistance, speed, and METs.;Short Term: Perform resistance training exercises routinely during rehab and add in resistance training at home;Long Term: Improve cardiorespiratory fitness, muscular endurance and strength as measured by increased METs and functional capacity (6MWT)       Able to understand and use rate of perceived exertion (RPE) scale Yes       Intervention Provide education and explanation on how to use RPE scale       Expected Outcomes Short Term: Able to use RPE daily in rehab to express subjective intensity level;Long Term:  Able to use RPE to guide intensity level when exercising independently       Able to understand and use Dyspnea scale Yes       Intervention Provide education and explanation on how to use Dyspnea scale       Expected Outcomes Short Term: Able to use Dyspnea scale daily  in rehab to express subjective sense of shortness of breath during exertion;Long Term: Able to use Dyspnea scale to guide intensity level when exercising  independently       Knowledge and understanding of Target Heart Rate Range (THRR) Yes       Intervention Provide education and explanation of THRR including how the numbers were predicted and where they are located for reference       Expected Outcomes Short Term: Able to state/look up THRR;Short Term: Able to use daily as guideline for intensity in rehab;Long Term: Able to use THRR to govern intensity when exercising independently       Able to check pulse independently Yes       Intervention Provide education and demonstration on how to check pulse in carotid and radial arteries.;Review the importance of being able to check your own pulse for safety during independent exercise       Expected Outcomes Short Term: Able to explain why pulse checking is important during independent exercise;Long Term: Able to check pulse independently and accurately       Understanding of Exercise Prescription Yes       Intervention Provide education, explanation, and written materials on patient's individual exercise prescription       Expected Outcomes Short Term: Able to explain program exercise prescription;Long Term: Able to explain home exercise prescription to exercise independently                Exercise Goals Re-Evaluation :  Exercise Goals Re-Evaluation     Row Name 12/03/20 1546 12/10/20 1310 12/24/20 1552 01/06/21 1557 01/08/21 1623     Exercise Goal Re-Evaluation   Exercise Goals Review Increase Physical Activity;Able to understand and use rate of perceived exertion (RPE) scale;Knowledge and understanding of Target Heart Rate Range (THRR);Understanding of Exercise Prescription;Increase Strength and Stamina;Able to check pulse independently Increase Physical Activity;Increase Strength and Stamina;Understanding of Exercise Prescription Increase Physical Activity;Increase Strength and Stamina Increase Physical Activity;Increase Strength and Stamina;Understanding of Exercise Prescription Increase  Physical Activity;Increase Strength and Stamina;Understanding of Exercise Prescription   Comments Reviewed RPE and dyspnea scales, THR and program prescription with pt today.  Pt voiced understanding and was given a copy of goals to take home. Jocelyn Lamer has only completed her first full session of exercise.  She left early due to her knee pain this week.  She is hoping to get an injection for it to help manage pain better.  She is going to keep [place] posted on how things are going. Jocelyn Lamer has only attended 3 times this month.  COnsistent attendance is necessary for good progress.  Staff will monitor while in session. Ashlynn continues to have inconsistent attendance.  She was here 5 times over the past month total.  She also continues to struggle with her knee.  We will continue to montior her progress and encourage improved attendance. Reviewed home exercise with pt today.  Pt plans to complete Youtube videos and is looking into the [facility]  for exercise.  Reviewed THR, pulse, RPE, sign and symptoms, pulse oximetery and when to call 911 or MD.  Also discussed weather considerations and indoor options.  Pt voiced understanding.   Expected Outcomes Short: Use RPE daily to regulate intensity. Long: Follow program prescription in THR. Short: Get some knee pain relief and return to attendance Long: Follow program prescription Short: get back to consistent attendance Long:  build stamina Short: Regular attendance Long: Continue to improve stamina. Short: Add on 1 day of exercise Long:  Exercise independently as home at appropriate exercise prescription    Row Name 01/22/21 1112 01/28/21 1539 02/03/21 1044 02/18/21 1556 02/23/21 1612     Exercise Goal Re-Evaluation   Exercise Goals Review Increase Physical Activity;Increase Strength and Stamina;Understanding of Exercise Prescription Increase Physical Activity;Increase Strength and Stamina;Understanding of Exercise Prescription Increase Physical Activity;Increase Strength  and Stamina;Understanding of Exercise Prescription Increase Physical Activity;Increase Strength and Stamina Increase Physical Activity;Increase Strength and Stamina   Comments Anahit still continues with inconsistent attendance. However, she has improved to level 4 on the Nustep and jumped up to 4 lbs for her handweights. She should show more improvement if she continuously comes. She should be encouraged to try different machines and walk if tolerated. Marvene has not been participating in aerobic exercise. She is very limited by her knee as she cannot walk very far without it hurting. She is looking into joining the [employer] and would like the staff to send her information over. She is able to tolerated seated exercises very well. She is hoping to buy a pulse ox to monitor her heart rate as well. She is trying to at least walk to her mailbox and box. Jaimee did buy 4 lb handweights and has been doing resistance training at home. Miciah is fair in rehab.  Her knee continues to limit her daily.  She will usually do 30 min on the seated equipment.  We will conitnue to monitor her progress. Lesha is still at level 1 on XR.  Staff will encourage trying to increase levels.  More consistent attendance would yield better results. Karyssa has changed her schedule around to attend more consistently.   Expected Outcomes Short: Exercise on different machine as tolerated/ walk Long: Continue to build up overall strength and stamina Short: Dealer and participate and aerovic exercise Long: Exercise independently at home at appropriate prescription Short: Increase workload on BioStep Long: Conitnue to improve stamina. Short:  increase workloads on seated machines Long:  build overall stamina Short: attend consistently Long: complete HT program    Row Name 03/02/21 1146             Exercise Goal Re-Evaluation   Exercise Goals Review Increase Physical Activity;Increase Strength and Stamina       Comments Evy  has been sporadic with her attendance and is not increasing her workloads. Staff needs to discuss with patient to talk about improvement and moving up. She recently spoke with her doctor who is allowing patient to start walking on the treadmill again which would be beneficial for her. Will continue to monitor.       Expected Outcomes Short: Improve attendance and start walking Long: Increase overall MET level                Discharge Exercise Prescription (Final Exercise Prescription Changes):  Exercise Prescription Changes - 03/02/21 1200       Response to Exercise   Blood Pressure (Admit) 130/72    Blood Pressure (Exit) 110/60    Heart Rate (Admit) 91 bpm    Heart Rate (Exercise) 115 bpm    Heart Rate (Exit) 95 bpm    Rating of Perceived Exertion (Exercise) 12    Symptoms knee pain    Duration Continue with 30 min of aerobic exercise without signs/symptoms of physical distress.    Intensity THRR unchanged      Progression   Progression Continue to progress workloads to maintain intensity without signs/symptoms of physical distress.    Average METs 3.95  Resistance Training   Training Prescription Yes    Weight 4 lb    Reps 10-15      Interval Training   Interval Training No      NuStep   Level 4    Minutes 30    METs 3.5      REL-XR   Level 1    Minutes 30    METs 4.5      Home Exercise Plan   Plans to continue exercise at [employer] (comment)   [employer] videos   Frequency Add 2 additional days to program exercise sessions.   Start with 1 day   Initial Home Exercises Provided 01/08/21             Nutrition:  Target Goals: Understanding of nutrition guidelines, daily intake of sodium <1546m, cholesterol <2058m calories 30% from fat and 7% or less from saturated fats, daily to have 5 or more servings of fruits and vegetables.  Education: All About Nutrition: -Group instruction provided by verbal, written material, interactive  activities, discussions, models, and posters to present general guidelines for heart healthy nutrition including fat, fiber, MyPlate, the role of sodium in heart healthy nutrition, utilization of the nutrition label, and utilization of this knowledge for meal planning. Follow up email sent as well. Written material given at graduation. [facility]  Date 12/08/15  Educator CoJaclyn ShaggyInstruction Review Code (retired) 2- meets goals/outcomes       Biometrics:  Pre Biometrics - 11/27/20 1629       Pre Biometrics   Height _0  (1.676 m)    Weight 246 lb 3.2 oz (111.7 kg)    BMI (Calculated) 39.76    Single Leg Stand 9.6 seconds              Nutrition Therapy Plan and Nutrition Goals:  Nutrition Therapy & Goals - 12/22/20 1528       Nutrition Therapy   Diet Low Na, HH, DM diet    Drug/Food Interactions Statins/Certain Fruits    Protein (specify units) 90g    Fiber 25 grams    Whole Grain Foods 3 servings    Saturated Fats 12 max. grams    Fruits and Vegetables 8 servings/day    Sodium 1.5 grams      Personal Nutrition Goals   Nutrition Goal ST: split white rice with brown rice LT: increase whole grains    Comments 6am will wake up, gets her gradchildren around 7am. B: bowl of oatmeal with 1/2 cup OJ and apple or banana L: the other day had a fruit salad with regular gingerale (hates diet soda), lean cuisine - macoroni wilth salsbury steak S: 1/2 cup popcorn D: porkchops, greens (largest portion), and rice (white rice). S: apple and peanut butter. Drinks: water with flavoring. greens, cabbage, green beans - doesn't like other vegetables. She will not eat any other vegetables. Husband uses pam cooking spray. Uses Mrs. Dash. Her Bg will run around 102 or 106. A1C: around 6. She doesn't want to change the way she eats, but is willing to try mixing brown rice with white rice. Discussed heart healthy, diabetes friendly  eatinf.      Intervention Plan   Intervention Prescribe, educate and counsel regarding individualized specific dietary modifications aiming towards targeted core components such as weight, hypertension, lipid management, diabetes, heart failure and other comorbidities.;Nutrition handout(s) given to patient.    Expected Outcomes Short  Term Goal: Understand basic principles of dietary content, such as calories, fat, sodium, cholesterol and nutrients.;Long Term Goal: Adherence to prescribed nutrition plan.;Short Term Goal: A plan has been developed with personal nutrition goals set during dietitian appointment.             Nutrition Assessments:  MEDIFICTS Score Key: ?70 Need to make dietary changes  40-70 Heart Healthy Diet ? 40 Therapeutic Level Cholesterol Diet  Flowsheet Row Cardiac Rehab from 12/22/2020 in [facility] Card[facility] Rehab  Picture Your Plate Total Score on Admission 50      Picture Your Plate Scores: <09 Unhealthy dietary pattern with much room for improvement. 41-50 Dietary pattern unlikely to meet recommendations for good health and room for improvement. 51-60 More healthful dietary pattern, with some room for improvement.  >60 Healthy dietary pattern, although there may be some specific behaviors that could be improved.    Nutrition Goals Re-Evaluation:  Nutrition Goals Re-Evaluation     [place] Name 01/28/21 1543 02/23/21 1609           Goals   Nutrition Goal ST: split white rice with brown rice LT: increase whole grains --      Comment [place] tried brown rice but did not like it. She doesn't think she can use it in her diet. She does try eat a mix of a white/wheat bread. She does not eat whole wheat breads as she also does not like the taste of that. She has tried to incoporate it with other flavors to help. She is looking to lose weight and has eliminated her sweet snacks. She opted to get light frozen yogurt to replace her normal ice cream. Jahnia has  goven up sweets and eats oranges, apples and bananas.  She likes white wheat bread and gets that instead of wheat.      Expected Outcome Short: Continue to increase whole grains in diet Long: Continue to maintain heart healthy diet Short: continue to try new whole grain foods Long: maintain heart healthy diet               Nutrition Goals Discharge (Final Nutrition Goals Re-Evaluation):  Nutrition Goals Re-Evaluation - 02/23/21 1609       Goals   Comment Gwendolin has goven up sweets and eats oranges, apples and bananas.  She likes white wheat bread and gets that instead of wheat.    Expected Outcome Short: continue to try new whole grain foods Long: maintain heart healthy diet             Psychosocial: Target Goals: Acknowledge presence or absence of significant depression and/or stress, maximize coping skills, provide positive support system. Participant is able to verbalize types and ability to use techniques and skills needed for reducing stress and depression.   Education: Stress, Anxiety, and Depression - Group verbal and visual presentation to define topics covered.  Reviews how body is impacted by stress, anxiety, and depression.  Also discusses healthy ways to reduce stress and to treat/manage anxiety and depression.  Written material given at graduation. Flowsheet Row Cardiac Rehab from 02/09/2016 in [facility] Cardiac and Pulmonary Rehab  Date 12/03/15  Educator Kathreen Cornfield, [facility]  Instruction Review Code (retired) 2- meets goals/outcomes       Education: Sleep Hygiene -Provides group verbal and written instruction about how sleep can affect your health.  Define sleep hygiene, discuss sleep cycles and impact of sleep habits. Review good sleep hygiene tips.    Initial Review & Psychosocial Screening:  Initial Psych Review & Screening - 11/21/20 1430       Initial Review   Current issues with History of Depression;Current Stress Concerns      Family Dynamics   Good Support  System? Yes      Barriers   Psychosocial barriers to participate in program The patient should benefit from training in stress management and relaxation.;There are no identifiable barriers or psychosocial needs.      Screening Interventions   Interventions Encouraged to exercise;Provide feedback about the scores to participant;To provide support and resources with identified psychosocial needs    Expected Outcomes Short Term goal: Utilizing psychosocial counselor, staff and physician to assist with identification of specific Stressors or current issues interfering with healing process. Setting desired goal for each stressor or current issue identified.;Long Term Goal: Stressors or current issues are controlled or eliminated.;Short Term goal: Identification and review with participant of any Quality of Life or Depression concerns found by scoring the questionnaire.;Long Term goal: The participant improves quality of Life and PHQ9 Scores as seen by post scores and/or verbalization of changes             Quality of Life Scores:   Quality of Life - 11/27/20 1650       Quality of Life Scores   Health/Function Pre 22.65 %    Socioeconomic Pre 24.75 %    Psych/Spiritual Pre 28.29 %    Family Pre 28.8 %    GLOBAL Pre 25.36 %            Scores of 19 and below usually indicate a poorer quality of life in these areas.  A difference of  2-3 points is a clinically meaningful difference.  A difference of 2-3 points in the total score of the Quality of Life Index has been associated with significant improvement in overall quality of life, self-image, physical symptoms, and general health in studies assessing change in quality of life.  PHQ-9: Recent Review Flowsheet Data     Depression screen [facility] 2/9 02/19/2021 11/27/2020 11/17/2020 08/20/2020 08/01/2020   Decreased Interest 0 0 0 0 0   Down, Depressed, Hopeless 0 0 0 0 0   PHQ - 2 Score 0 0 0 0 0   Altered sleeping - 0 - - -   Tired, decreased  energy - 0 - - -   Change in appetite - 0 - - -   Feeling bad or failure about yourself  - 0 - - -   Trouble concentrating - 0 - - -   Moving slowly or fidgety/restless - 0 - - -   Suicidal thoughts - 0 - - -   PHQ-9 Score - 0 - - -   Difficult doing work/chores - Not difficult at all - - -      Interpretation of Total Score  Total Score Depression Severity:  1-4 = Minimal depression, 5-9 = Mild depression, 10-14 = Moderate depression, 15-19 = Moderately severe depression, 20-27 = Severe depression   Psychosocial Evaluation and Intervention:  Psychosocial Evaluation - 11/21/20 1438       Psychosocial Evaluation & Interventions   Comments Cassaundra is returning to Cardiac Rehab with another stent. She did some of the program in 2020. She states she is doing well and "doesn't let things stress me out." She reports to have a good support system, sleeping well, and is enjoying her life day to day. She is looking forward to coming to Cardiac Rehab to start back exercising  and lose weight and hopes to get into a good routine.    Expected Outcomes Short: attend cardiac rehab for education and exercise. Long: develop and maintain positive self care habits.    Continue Psychosocial Services  Follow up required by staff             Psychosocial Re-Evaluation:  Psychosocial Re-Evaluation     [place] Name 01/01/21 1608 01/28/21 1550 02/23/21 1611         Psychosocial Re-Evaluation   Current issues with None Identified None Identified None Identified     Comments Patient reports no issues with their current mental states, sleep, stress, depression or anxiety. Will follow up with patient in a few weeks for any changes. Mikah denies any concerns with depression, anxiety, or big stressors. She has good support from husband and kids. Feels exercise has helped her feel stronger even though she's limited by walking, Gabriele says she doesnt let things stress her out.     Expected Outcomes Short:  Continue to exercise regularly to support mental health and notify staff of any changes. Long: maintain mental health and well being through teaching of rehab or prescribed medications independently. Short: Continue attending rehab consistently Long: Continue to exercise for stress management and maintain posititve attitude ST/LT: maintain positive outlook and low stress     Interventions Encouraged to attend Cardiac Rehabilitation for the exercise Encouraged to attend Cardiac Rehabilitation for the exercise --     Continue Psychosocial Services  Follow up required by staff Follow up required by staff --              Psychosocial Discharge (Final Psychosocial Re-Evaluation):  Psychosocial Re-Evaluation - 02/23/21 1611       Psychosocial Re-Evaluation   Current issues with None Identified    Comments Benedicta says she doesnt let things stress her out.    Expected Outcomes ST/LT: maintain positive outlook and low stress             Vocational Rehabilitation: Provide vocational rehab assistance to qualifying candidates.   Vocational Rehab Evaluation & Intervention:  Vocational Rehab - 11/21/20 1430       Initial Vocational Rehab Evaluation & Intervention   Assessment shows need for Vocational Rehabilitation No             Education: Education Goals: Education classes will be provided on a variety of topics geared toward better understanding of heart health and risk factor modification. Participant will state understanding/return demonstration of topics presented as noted by education test scores.  Learning Barriers/Preferences:  Learning Barriers/Preferences - 11/21/20 1430       Learning Barriers/Preferences   Learning Barriers Sight   glasses   Learning Preferences Individual Instruction;Verbal Instruction             General Cardiac Education Topics:  AED/CPR: - Group verbal and written instruction with the use of models to demonstrate the basic use of the  AED with the basic ABC's of resuscitation.   Anatomy and Cardiac Procedures: - Group verbal and visual presentation and models provide information about basic cardiac anatomy and function. Reviews the testing methods done to diagnose heart disease and the outcomes of the test results. Describes the treatment choices: Medical Management, Angioplasty, or Coronary Bypass Surgery for treating various heart conditions including Myocardial Infarction, Angina, Valve Disease, and Cardiac Arrhythmias.  Written material given at graduation.   Medication Safety: - Group verbal and visual instruction to review commonly prescribed medications for heart and lung disease.  Reviews the medication, class of the drug, and side effects. Includes the steps to properly store meds and maintain the prescription regimen.  Written material given at graduation.   Intimacy: - Group verbal instruction through game format to discuss how heart and lung disease can affect sexual intimacy. Written material given at graduation..   Know Your Numbers and Heart Failure: - Group verbal and visual instruction to discuss disease risk factors for cardiac and pulmonary disease and treatment options.  Reviews associated critical values for Overweight/Obesity, Hypertension, Cholesterol, and Diabetes.  Discusses basics of heart failure: signs/symptoms and treatments.  Introduces Heart Failure Zone chart for action plan for heart failure.  Written material given at graduation.   Infection Prevention: - Provides verbal and written material to individual with discussion of infection control including proper hand washing and proper equipment cleaning during exercise session. Flowsheet Row Cardiac Rehab from 11/27/2020 in [facility] Cardiac and Pulmonary Rehab  Date 11/27/20  Educator [place]  Instruction Review Code 1- Verbalizes Understanding       Falls Prevention: - Provides verbal and written material to individual with discussion of falls  prevention and safety. Flowsheet Row Cardiac Rehab from 11/27/2020 in [facility] Cardiac and Pulmonary Rehab  Date 11/27/20  Educator [place]  Instruction Review Code 1- Verbalizes Understanding       Other: -Provides group and verbal instruction on various topics (see comments)   Knowledge Questionnaire Score:  Knowledge Questionnaire Score - 11/27/20 1639       Knowledge Questionnaire Score   Pre Score 18/26: Heart attack, HF, Nutrition, Stress, Exercise, Nutrition             Core Components/Risk Factors/Patient Goals at Admission:  Personal Goals and Risk Factors at Admission - 11/27/20 1637       Core Components/Risk Factors/Patient Goals on Admission    Weight Management Yes;Obesity;Weight Loss    Intervention Weight Management: Develop a combined nutrition and exercise program designed to reach desired caloric intake, while maintaining appropriate intake of nutrient and fiber, sodium and fats, and appropriate energy expenditure required for the weight goal.;Weight Management: Provide education and appropriate resources to help participant work on and attain dietary goals.;Weight Management/Obesity: Establish reasonable short term and long term weight goals.;Obesity: Provide education and appropriate resources to help participant work on and attain dietary goals.    Admit Weight 246 lb (111.6 kg)    Goal Weight: Short Term 240 lb (108.9 kg)    Goal Weight: Long Term 200 lb (90.7 kg)    Expected Outcomes Short Term: Continue to assess and modify interventions until short term weight is achieved;Long Term: Adherence to nutrition and physical activity/exercise program aimed toward attainment of established weight goal;Weight Loss: Understanding of general recommendations for a balanced deficit meal plan, which promotes 1-2 lb weight loss per week and includes a negative energy balance of [phone] kcal/d;Understanding recommendations for meals to include 15-35% energy as protein, 25-35%  energy from fat, 35-60% energy from carbohydrates, less than 25m of dietary cholesterol, 20-35 gm of total fiber daily;Understanding of distribution of calorie intake throughout the day with the consumption of 4-5 meals/snacks    Diabetes Yes    Intervention Provide education about signs/symptoms and action to take for hypo/hyperglycemia.;Provide education about proper nutrition, including hydration, and aerobic/resistive exercise prescription along with prescribed medications to achieve blood glucose in normal ranges: Fasting glucose 65-99 mg/dL    Expected Outcomes Short Term: Participant verbalizes understanding of the signs/symptoms and immediate care of hyper/hypoglycemia, proper foot care and  importance of medication, aerobic/resistive exercise and nutrition plan for blood glucose control.;Long Term: Attainment of HbA1C < 7%.    Hypertension Yes    Intervention Provide education on lifestyle modifcations including regular physical activity/exercise, weight management, moderate sodium restriction and increased consumption of fresh fruit, vegetables, and low fat dairy, alcohol moderation, and smoking cessation.;Monitor prescription use compliance.    Expected Outcomes Short Term: Continued assessment and intervention until BP is < 140/67m HG in hypertensive participants. < 130/843mHG in hypertensive participants with diabetes, heart failure or chronic kidney disease.;Long Term: Maintenance of blood pressure at goal levels.    Lipids Yes    Intervention Provide education and support for participant on nutrition & aerobic/resistive exercise along with prescribed medications to achieve LDL <7013mHDL >37m20m  Expected Outcomes Short Term: Participant states understanding of desired cholesterol values and is compliant with medications prescribed. Participant is following exercise prescription and nutrition guidelines.;Long Term: Cholesterol controlled with medications as prescribed, with individualized  exercise RX and with personalized nutrition plan. Value goals: LDL < 70mg91mL > 40 mg.             Education:Diabetes - Individual verbal and written instruction to review signs/symptoms of diabetes, desired ranges of glucose level fasting, after meals and with exercise. Acknowledge that pre and post exercise glucose checks will be done for 3 sessions at entry of program. [facility]  Education need identified 11/27/20  Date 11/27/20  Educator [place]  IMonroviatruction Review Code 1- Verbalizes Understanding       Core Components/Risk Factors/Patient Goals Review:   Goals and Risk Factor Review     Row Name 01/01/21 1605 01/28/21 1545 02/23/21 1607         Core Components/Risk Factors/Patient Goals Review   Personal Goals Review Weight Management/Obesity;Hypertension;Diabetes Weight Management/Obesity;Hypertension;Diabetes Weight Management/Obesity;Hypertension;Diabetes     Review Tennelle has been checking her blood pressure daily, once in the morning and once at night. She checks her sugar in the morning before she eats. Her blood sugar has been around 90-100 before she eats. She wants to work on weight loss. Detra continues to check her sugars every morning, states its around 118 as a fasting glucose before breakfast. Her BP cuff broke and she is planning on getting a new one so she has not checked her BP. She wants to lose weight- she has been giving up her sweets, especially ice cream. She is opting to get frozen yogurt- light version instead. Her goal weight is 180 lb and is hoping her exericse will help too. Vivkie says BG has been between 100-101.  A1C was 7 at  check last week.  She can tell her clothes fit loose.     Expected Outcomes Short: lose 5 pounds in the next two weeks. Long: reach a weight goal of 200. Short: Continue making modifcations to diet to promote weight loss Long: Continue to manage lifestyle risk  factors Short:  continue current diet and exercise Long: manage risk factors long term              Core Components/Risk Factors/Patient Goals at Discharge (Final Review):   Goals and Risk Factor Review - 02/23/21 1607       Core Components/Risk Factors/Patient Goals Review   Personal Goals Review Weight Management/Obesity;Hypertension;Diabetes    Review [place]kie says BG has been between 100-101.  A1C was 7 at  check last week.  She can tell her clothes  fit loose.    Expected Outcomes Short:  continue current diet and exercise Long: manage risk factors long term             ITP Comments:  ITP Comments     Row Name 11/21/20 1432 11/27/20 1704 12/03/20 1546 12/17/20 0930 12/18/20 1649   ITP Comments Initial telephone orienation completed. Diagnosis can be found in [facility] 5/3. EP orientation scheduled for Thursday 5/26 at 3pm. Completed 6MWT and gym orientation. Initial ITP created and sent for review to Dr. Emily Filbert, Medical Director. First full day of exercise!  Patient was oriented to gym and equipment including functions, settings, policies, and procedures.  Patient's individual exercise prescription and treatment plan were reviewed.  All starting workloads were established based on the results of the 6 minute walk test done at initial orientation visit.  The plan for exercise progression was also introduced and progression will be customized based on patient's performance and goals. 30 Day review completed. Medical Director ITP review done, changes made as directed, and signed approval by Medical Director.  2 visits in June Aisa reports she will be getting her left knee replaced next month. Before then we will need to discharge her and when she is ready to return she can get a new referral to come back to rehab. Until then she will continue to come to rehab. She will know by the end of June what the date is.    [place] Name 01/14/21 1208 02/11/21 0834 03/02/21 1727 03/11/21 0807     ITP  Comments 30 Day review completed. Medical Director ITP review done, changes made as directed, and signed approval by Medical Director. 30 Day review completed. Medical Director ITP review done, changes made as directed, and signed approval by Medical Director. Patient's attendance has been sporadic. Staff spoke with patient today and reiterated the attendance policy, stating she only has 18 weeks to complete the program and may not benefit from it if her attendance is poor. Patient was reminded to call if she can't come. Patient understood and agreed and states she will try to improve her coming. 30 Day review completed. Medical Director ITP review done, changes made as directed, and signed approval by Medical Director.             Comments:

## 2021-03-12 ENCOUNTER — Encounter: Payer: Self-pay | Admitting: Internal Medicine

## 2021-03-12 ENCOUNTER — Ambulatory Visit: Payer: Medicare Other

## 2021-03-12 ENCOUNTER — Other Ambulatory Visit: Payer: Self-pay

## 2021-03-12 ENCOUNTER — Telehealth: Payer: Self-pay

## 2021-03-12 ENCOUNTER — Ambulatory Visit (INDEPENDENT_AMBULATORY_CARE_PROVIDER_SITE_OTHER): Payer: Medicare Other | Admitting: Internal Medicine

## 2021-03-12 ENCOUNTER — Other Ambulatory Visit: Payer: Self-pay | Admitting: Internal Medicine

## 2021-03-12 VITALS — BP 146/82 | HR 80 | Temp 98.2°F | Resp 16 | Ht 67.0 in | Wt 240.4 lb

## 2021-03-12 DIAGNOSIS — R0602 Shortness of breath: Secondary | ICD-10-CM

## 2021-03-12 DIAGNOSIS — R0789 Other chest pain: Secondary | ICD-10-CM

## 2021-03-12 DIAGNOSIS — E876 Hypokalemia: Secondary | ICD-10-CM

## 2021-03-12 DIAGNOSIS — I2510 Atherosclerotic heart disease of native coronary artery without angina pectoris: Secondary | ICD-10-CM | POA: Diagnosis not present

## 2021-03-12 DIAGNOSIS — R9431 Abnormal electrocardiogram [ECG] [EKG]: Secondary | ICD-10-CM | POA: Insufficient documentation

## 2021-03-12 DIAGNOSIS — Z955 Presence of coronary angioplasty implant and graft: Secondary | ICD-10-CM

## 2021-03-12 MED ORDER — OXYCODONE-ACETAMINOPHEN 5-325 MG PO TABS
ORAL_TABLET | ORAL | 0 refills | Status: DC
Start: 2021-03-12 — End: 2021-06-10

## 2021-03-12 MED ORDER — LEVOFLOXACIN 500 MG PO TABS: 500.0000 mg | ORAL_TABLET | Freq: Every day | ORAL | 0 refills | Status: DC

## 2021-03-12 MED ORDER — OXYCODONE HCL 5 MG PO CAPS: ORAL_CAPSULE | ORAL | 0 refills | Status: DC

## 2021-03-12 MED ORDER — POTASSIUM CHLORIDE CRYS ER 10 MEQ PO TBCR: 10.0000 meq | EXTENDED_RELEASE_TABLET | Freq: Two times a day (BID) | ORAL | 2 refills | Status: DC

## 2021-03-12 NOTE — Progress Notes (Signed)
[facility] [place],  [address]  Internal MEDICINE  Office Visit Note  Patient Name: Felicia Woods  [id number]  [id number]  Date of Service: 03/16/2021  Chief Complaint  Patient presents with   Follow-up    Chest pain, right chest bone, and right side, hurts to inhale , cough,sneeze, hard to get out of bed.  Started 03/06/21    HPI Patient is here after ED visit with acute right-sided chest pain initial cardiac work-up was negative she was seen by cardiology as well and is scheduled to have CT angiogram. Patient seems to be uncomfortable and points to the right side for pain, pain is reproducible. All diagnostics including x-rays and laboratory I reviewed from ED patient has low potassium and is not taking any replacement, initial supplement was provided by ED Her recent admission to the hospital required angioplasty normal EF of 55%   Current Medication: Outpatient Encounter Medications as of 03/12/2021  Medication Sig   Accu-Chek Softclix Lancets lancets Use as instructed twice a day to check blood sugars  DIAG -E11.59   albuterol (PROVENTIL HFA;VENTOLIN HFA) 108 (90 Base) MCG/ACT inhaler Inhale 2 puffs into the lungs every 6 (six) hours as needed for wheezing or shortness of breath.   amLODipine (NORVASC) 10 MG tablet Take 1 tablet (10 mg total) by mouth daily.   aspirin EC 81 MG tablet Take 81 mg by mouth daily.   atorvastatin (LIPITOR) 80 MG tablet Take 1 tablet (80 mg total) by mouth daily.   canagliflozin (INVOKANA) 300 MG TABS tablet Take 1 tablet (300 mg total) by mouth daily before breakfast.   clopidogrel (PLAVIX) 75 MG tablet Take 1 tablet (75 mg total) by mouth daily.   dapagliflozin propanediol (FARXIGA) 10 MG TABS tablet Farxiga 10 mg tablet   Dulaglutide (TRULICITY) 1.5 ZS/8.2LM SOPN INJECT 1.5MG INTO THE SKIN ONCE A WEEK   famotidine (PEPCID) 20 MG tablet Take 1 tablet (20 mg total) by mouth 2 (two) times daily.   furosemide (LASIX)  40 MG tablet Take 1 tablet (40 mg total) by mouth 2 (two) times daily.   glucose blood (ACCU-CHEK GUIDE) test strip Use as instructed to check blood sugars twice a day  E11.59   isosorbide mononitrate (IMDUR) 60 MG 24 hr tablet Take 60 mg by mouth daily.   levofloxacin (LEVAQUIN) 500 MG tablet Take 1 tablet (500 mg total) by mouth daily.   lidocaine (LIDODERM) 5 % Place 1 patch onto the skin every 12 (twelve) hours. Remove & Discard patch within 12 hours or as directed by MD   losartan (COZAAR) 100 MG tablet Take 1 tablet (100 mg total) by mouth daily. Take one tab po qd   metoprolol succinate (TOPROL-XL) 25 MG 24 hr tablet Take 25 mg by mouth daily.   montelukast (SINGULAIR) 10 MG tablet Take 1 tablet (10 mg total) by mouth at bedtime.   neomycin-polymyxin-dexamethasone (MAXITROL) 0.1 % ophthalmic suspension neomycin-polymyxin-dexameth 3.5 mg/mL-10,000 unit/mL-0.1% eye drops  INSTILL 1 DROP INTO EACH EYE 4 TIMES DAILY FOR 7 DAYS   nitroGLYCERIN (NITROSTAT) 0.4 MG SL tablet Place 1 tablet (0.4 mg total) under the tongue every 5 (five) minutes x 3 doses as needed for chest pain. *If no relief call MD or go to Emergency Room*   nitroGLYCERIN (NITROSTAT) 0.4 MG SL tablet Place under the tongue.   oxybutynin (DITROPAN-XL) 5 MG 24 hr tablet Take 1 tablet (5 mg total) by mouth daily with supper.   oxycodone (OXY-IR) 5 MG  capsule Take one tab bid fro pain   oxyCODONE-acetaminophen (PERCOCET/ROXICET) 5-325 MG tablet Take one tab po bid prn for pain   potassium chloride (KLOR-CON) 10 MEQ tablet Take 1 tablet (10 mEq total) by mouth 2 (two) times daily.   ranolazine (RANEXA) 500 MG 12 hr tablet Take 1 tablet by mouth 2 (two) times daily.   tiZANidine (ZANAFLEX) 2 MG tablet Take 1 tablet (2 mg total) by mouth at bedtime.   zolpidem (AMBIEN) 10 MG tablet Take 1 tablet (10 mg total) by mouth at bedtime as needed for sleep.   No facility-administered encounter medications on file as of 03/12/2021.    Surgical  History: Past Surgical History:  Procedure Laterality Date   ABDOMINAL HYSTERECTOMY     CARDIAC CATHETERIZATION     CARDIAC CATHETERIZATION N/A 10/18/2015   Procedure: Left Heart Cath and Cors/Grafts Angiography;  Surgeon: Lorretta Harp, MD;  Location: [place] [facility];  Service: Cardiovascular;  Laterality: N/A;   CARDIAC CATHETERIZATION N/A 10/18/2015   Procedure: Coronary Stent Intervention;  Surgeon: Lorretta Harp, MD;  Location: [place] [facility];  Service: Cardiovascular;  Laterality: N/A;   CARDIAC SURGERY     CHOLECYSTECTOMY     CORONARY ANGIOPLASTY     CORONARY ARTERY BYPASS GRAFT     4 vessels - 2010   CORONARY STENT INTERVENTION N/A 02/16/2017   Procedure: CORONARY STENT INTERVENTION;  Surgeon: Yolonda Kida, MD;  Location: [place] [facility];  Service: Cardiovascular;  Laterality: N/A;   CORONARY STENT INTERVENTION N/A 02/13/2019   Procedure: CORONARY STENT INTERVENTION;  Surgeon: Nelva Bush, MD;  Location: [place] [facility];  Service: Cardiovascular;  Laterality: N/A;  SVG to RCA   CORONARY STENT INTERVENTION Left 03/08/2019   Procedure: CORONARY STENT INTERVENTION;  Surgeon: Yolonda Kida, MD;  Location: [place] [facility];  Service: Cardiovascular;  Laterality: Left;   CORONARY STENT INTERVENTION N/A 11/04/2020   Procedure: CORONARY STENT INTERVENTION;  Surgeon: Nelva Bush, MD;  Location: [place] [facility];  Service: Cardiovascular;  Laterality: N/A;   LEFT HEART CATH AND CORONARY ANGIOGRAPHY Left 02/16/2017   Procedure: LEFT HEART CATH AND CORONARY ANGIOGRAPHY;  Surgeon: Corey Skains, MD;  Location: [place] [facility];  Service: Cardiovascular;  Laterality: Left;   LEFT HEART CATH AND CORS/GRAFTS ANGIOGRAPHY Left 11/23/2017   Procedure: LEFT HEART CATH AND CORS/GRAFTS ANGIOGRAPHY;  Surgeon: Corey Skains, MD;  Location: [place] [facility];  Service: Cardiovascular;  Laterality: Left;   LEFT HEART CATH AND CORS/GRAFTS ANGIOGRAPHY  N/A 02/13/2019   Procedure: LEFT HEART CATH AND CORS/GRAFTS ANGIOGRAPHY;  Surgeon: Corey Skains, MD;  Location: [place] [facility];  Service: Cardiovascular;  Laterality: N/A;   LEFT HEART CATH AND CORS/GRAFTS ANGIOGRAPHY N/A 07/03/2019   Procedure: LEFT HEART CATH AND CORS/GRAFTS ANGIOGRAPHY;  Surgeon: Corey Skains, MD;  Location: [place] [facility];  Service: Cardiovascular;  Laterality: N/A;   LEFT HEART CATH AND CORS/GRAFTS ANGIOGRAPHY N/A 11/04/2020   Procedure: LEFT HEART CATH AND CORS/GRAFTS ANGIOGRAPHY;  Surgeon: Corey Skains, MD;  Location: [place] [facility];  Service: Cardiovascular;  Laterality: N/A;    Medical History: Past Medical History:  Diagnosis Date   Asthma    Coronary artery disease    Diabetes mellitus without complication ([place])    Heart attack ([place])    Hyperlipidemia    Hypertension    MI (myocardial infarction) ([place])    Migraine headache with aura    Ovarian  neoplasm    BRCA negative    Family History: Family History  Problem Relation Age of Onset   Diabetes Mother    Diabetes Father    Cancer Father    Diabetes Brother     Social History   Socioeconomic History   Marital status: Widowed    Spouse name: Jeneen Rinks   Number of children: Not on file   Years of education: Not on file   Highest education level: Not on file  Occupational History   Not on file  Tobacco Use   Smoking status: Never   Smokeless tobacco: Never  Vaping Use   Vaping Use: Never used  Substance and Sexual Activity   Alcohol use: No    Alcohol/week: 0.0 standard drinks   Drug use: No   Sexual activity: Yes  Other Topics Concern   Not on file  Social History Narrative   Not on file   Social Determinants of Health   Financial Resource Strain: Not on file  Food Insecurity: Not on file  Transportation Needs: Not on file  Physical Activity: Not on file  Stress: Not on file  Social Connections: Not on file  Intimate Partner Violence: Not on file       Review of Systems  Constitutional:  Negative for chills, fatigue and unexpected weight change.  HENT:  Negative for congestion, postnasal drip, rhinorrhea, sneezing and sore throat.   Eyes:  Negative for redness.  Respiratory:  Negative for cough, chest tightness and shortness of breath.   Cardiovascular:  Positive for chest pain. Negative for palpitations.  Gastrointestinal:  Negative for abdominal pain, constipation, diarrhea, nausea and vomiting.  Genitourinary:  Negative for dysuria and frequency.  Musculoskeletal:  Negative for arthralgias, back pain, joint swelling and neck pain.  Skin:  Negative for rash.  Neurological: Negative.  Negative for tremors and numbness.  Hematological:  Negative for adenopathy. Does not bruise/bleed easily.  Psychiatric/Behavioral:  Negative for behavioral problems (Depression), sleep disturbance and suicidal ideas. The patient is not nervous/anxious.    Vital Signs: BP (!) 146/82   Pulse 80   Temp 98.2 F (36.8 C)   Resp 16   Ht '5\' 7"'  (1.702 m)   Wt 240 lb 6.4 oz (109 kg)   SpO2 93%   BMI 37.65 kg/m    Physical Exam Constitutional:      Appearance: Normal appearance.  HENT:     Head: Normocephalic and atraumatic.     Nose: Nose normal.     Mouth/Throat:     Mouth: Mucous membranes are moist.     Pharynx: No posterior oropharyngeal erythema.  Eyes:     Extraocular Movements: Extraocular movements intact.     Pupils: Pupils are equal, round, and reactive to light.  Cardiovascular:     Rate and Rhythm: Normal rate.     Pulses: Normal pulses.     Heart sounds: Normal heart sounds.     Comments: Pain is reproducible on right lower rib cage  Pulmonary:     Effort: Pulmonary effort is normal.     Breath sounds: Normal breath sounds.  Neurological:     General: No focal deficit present.     Mental Status: She is alert.  Psychiatric:        Mood and Affect: Mood normal.        Behavior: Behavior normal.        Assessment/Plan: 1. Right-sided chest wall pain Chest x-ray from ED is reviewed questionable pneumonia or infiltrate  we will treat with Levaquin for now pain control with oxycodone 5 mg twice a day - oxycodone (OXY-IR) 5 MG capsule; Take one tab bid fro pain  Dispense: 15 capsule; Refill: 0 - levofloxacin (LEVAQUIN) 500 MG tablet; Take 1 tablet (500 mg total) by mouth daily.  Dispense: 10 tablet; Refill: 0  2. Coronary artery disease involving native coronary artery of native heart without angina pectoris Recent angioplasty by cardiology, she has scheduled a CT angiogram  3. Hypokalemia 80 labs reviewed, showed low potassium will supplement and repeat as indicated - potassium chloride (KLOR-CON) 10 MEQ tablet; Take 1 tablet (10 mEq total) by mouth 2 (two) times daily.  Dispense: 60 tablet; Refill: 2 - oxyCODONE-acetaminophen (PERCOCET/ROXICET) 5-325 MG tablet; Take one tab po bid prn for pain  Dispense: 30 tablet; Refill: 0   General Counseling: Deloria verbalizes understanding of the findings of todays visit and agrees with plan of treatment. I have discussed any further diagnostic evaluation that may be needed or ordered today. We also reviewed her medications today. she has been encouraged to call the office with any questions or concerns that should arise related to todays visit.    No orders of the defined types were placed in this encounter.   Meds ordered this encounter  Medications   oxycodone (OXY-IR) 5 MG capsule    Sig: Take one tab bid fro pain    Dispense:  15 capsule    Refill:  0   potassium chloride (KLOR-CON) 10 MEQ tablet    Sig: Take 1 tablet (10 mEq total) by mouth 2 (two) times daily.    Dispense:  60 tablet    Refill:  2   levofloxacin (LEVAQUIN) 500 MG tablet    Sig: Take 1 tablet (500 mg total) by mouth daily.    Dispense:  10 tablet    Refill:  0   oxyCODONE-acetaminophen (PERCOCET/ROXICET) 5-325 MG tablet    Sig: Take one tab po bid prn for  pain    Dispense:  30 tablet    Refill:  0    Total time spent:34 Minutes Time spent includes review of chart, medications, test results, and follow up plan with the patient.   [place] Controlled Substance Database was reviewed by me.   Dr Lavera Guise Internal medicine

## 2021-03-12 NOTE — Telephone Encounter (Signed)
Patient called and stated she will be out of rehab next week; per patient she saw her doctor who states she may have pneumonia or fluid. Patient will keep [place] updated and will receive clearance to come back when she feels ready to do so.

## 2021-03-12 NOTE — Telephone Encounter (Signed)
Spoke with pharmacy and canceled oxycodone capsule and send pres and dr Humphrey Rolls will send another pres

## 2021-03-16 ENCOUNTER — Ambulatory Visit: Payer: Medicare Other

## 2021-03-17 DIAGNOSIS — M79606 Pain in leg, unspecified: Secondary | ICD-10-CM | POA: Insufficient documentation

## 2021-03-18 ENCOUNTER — Ambulatory Visit
Admission: RE | Admit: 2021-03-18 | Discharge: 2021-03-18 | Disposition: A | Discharge status: Home / Self Care | Payer: Medicare Other | Source: Ambulatory Visit | Attending: Internal Medicine | Admitting: Internal Medicine

## 2021-03-18 ENCOUNTER — Other Ambulatory Visit: Payer: Self-pay

## 2021-03-18 ENCOUNTER — Ambulatory Visit: Payer: Medicare Other

## 2021-03-18 DIAGNOSIS — R0602 Shortness of breath: Secondary | ICD-10-CM

## 2021-03-19 ENCOUNTER — Ambulatory Visit: Payer: Medicare Other

## 2021-03-19 ENCOUNTER — Ambulatory Visit
Admission: RE | Admit: 2021-03-19 | Discharge: 2021-03-19 | Disposition: A | Discharge status: Home / Self Care | Payer: Medicare Other | Source: Ambulatory Visit | Attending: Internal Medicine | Admitting: Internal Medicine

## 2021-03-19 DIAGNOSIS — R0602 Shortness of breath: Secondary | ICD-10-CM | POA: Insufficient documentation

## 2021-03-19 MED ORDER — IOHEXOL 350 MG/ML SOLN
75.0000 mL | Freq: Once | INTRAVENOUS | Status: AC | PRN
  Administered 2021-03-19: 75 mL via INTRAVENOUS

## 2021-03-23 ENCOUNTER — Ambulatory Visit: Payer: Medicare Other

## 2021-03-25 ENCOUNTER — Ambulatory Visit: Payer: Medicare Other

## 2021-03-26 ENCOUNTER — Ambulatory Visit: Payer: Medicare Other

## 2021-03-30 ENCOUNTER — Ambulatory Visit: Payer: Medicare Other

## 2021-03-30 ENCOUNTER — Telehealth: Payer: Self-pay

## 2021-03-30 NOTE — Telephone Encounter (Signed)
Spoke with patient who recently saw her cardiologist on Friday who is referring her to a pulmonologist. Patient will call us after she completed her consultation and re-evaluate. Patient will need clearance to come back and/or discuss possible discharge pending results.

## 2021-04-01 ENCOUNTER — Ambulatory Visit: Payer: Medicare Other

## 2021-04-02 ENCOUNTER — Ambulatory Visit: Payer: Medicare Other

## 2021-04-03 ENCOUNTER — Other Ambulatory Visit: Payer: Self-pay | Admitting: Physician Assistant

## 2021-04-03 DIAGNOSIS — E1165 Type 2 diabetes mellitus with hyperglycemia: Secondary | ICD-10-CM

## 2021-04-06 ENCOUNTER — Ambulatory Visit: Payer: Self-pay | Admitting: Internal Medicine

## 2021-04-06 ENCOUNTER — Ambulatory Visit: Payer: Medicare Other

## 2021-04-06 DIAGNOSIS — Z0289 Encounter for other administrative examinations: Secondary | ICD-10-CM

## 2021-04-08 ENCOUNTER — Ambulatory Visit: Payer: Medicare Other

## 2021-04-08 ENCOUNTER — Encounter: Payer: Self-pay | Admitting: *Deleted

## 2021-04-08 DIAGNOSIS — Z955 Presence of coronary angioplasty implant and graft: Secondary | ICD-10-CM

## 2021-04-08 DIAGNOSIS — M25511 Pain in right shoulder: Secondary | ICD-10-CM | POA: Insufficient documentation

## 2021-04-08 DIAGNOSIS — M19012 Primary osteoarthritis, left shoulder: Secondary | ICD-10-CM | POA: Insufficient documentation

## 2021-04-08 DIAGNOSIS — M19011 Primary osteoarthritis, right shoulder: Secondary | ICD-10-CM | POA: Insufficient documentation

## 2021-04-08 NOTE — Progress Notes (Signed)
Cardiac Individual Treatment Plan  Patient Details  Name: Toby Ayad MRN: [id number] Date of Birth: 1955-07-08 Referring Provider:   Flowsheet Row Cardiac Rehab from 11/27/2020 in [facility] Cardiac and Pulmonary Rehab  Referring Provider Serafina Royals MD       Initial Encounter Date:  Flowsheet Row Cardiac Rehab from 11/27/2020 in [facility] Cardiac and Pulmonary Rehab  Date 11/27/20       Visit Diagnosis: S/P coronary artery stent placement  Patient's Home Medications on Admission:  Current Outpatient Medications:    Accu-Chek Softclix Lancets lancets, Use as instructed twice a day to check blood sugars  DIAG -E11.59, Disp: 100 each, Rfl: 3   albuterol (PROVENTIL HFA;VENTOLIN HFA) 108 (90 Base) MCG/ACT inhaler, Inhale 2 puffs into the lungs every 6 (six) hours as needed for wheezing or shortness of breath., Disp: , Rfl:    amLODipine (NORVASC) 10 MG tablet, Take 1 tablet (10 mg total) by mouth daily., Disp: 90 tablet, Rfl: 1   aspirin EC 81 MG tablet, Take 81 mg by mouth daily., Disp: , Rfl:    atorvastatin (LIPITOR) 80 MG tablet, Take 1 tablet (80 mg total) by mouth daily., Disp: 30 tablet, Rfl: 2   clopidogrel (PLAVIX) 75 MG tablet, Take 1 tablet (75 mg total) by mouth daily., Disp: 90 tablet, Rfl: 3   dapagliflozin propanediol (FARXIGA) 10 MG TABS tablet, Farxiga 10 mg tablet, Disp: , Rfl:    Dulaglutide (TRULICITY) 1.5 AP/0.1ID SOPN, INJECT 1.5MG INTO THE SKIN ONCE A WEEK, Disp: 12 mL, Rfl: 1   famotidine (PEPCID) 20 MG tablet, Take 1 tablet (20 mg total) by mouth 2 (two) times daily., Disp: 60 tablet, Rfl: 2   furosemide (LASIX) 40 MG tablet, Take 1 tablet (40 mg total) by mouth 2 (two) times daily., Disp: 180 tablet, Rfl: 1   glucose blood (ACCU-CHEK GUIDE) test strip, Use as instructed to check blood sugars twice a day  E11.59, Disp: 100 each, Rfl: 3   INVOKANA 300 MG TABS tablet, TAKE 1 TABLET BY MOUTH ONCE DAILY BEFORE BREAKFAST, Disp: 90 tablet, Rfl: 1   isosorbide mononitrate  (IMDUR) 60 MG 24 hr tablet, Take 60 mg by mouth daily., Disp: , Rfl:    levofloxacin (LEVAQUIN) 500 MG tablet, Take 1 tablet (500 mg total) by mouth daily., Disp: 10 tablet, Rfl: 0   lidocaine (LIDODERM) 5 %, Place 1 patch onto the skin every 12 (twelve) hours. Remove & Discard patch within 12 hours or as directed by MD, Disp: 10 patch, Rfl: 0   losartan (COZAAR) 100 MG tablet, Take 1 tablet (100 mg total) by mouth daily. Take one tab po qd, Disp: 90 tablet, Rfl: 1   metoprolol succinate (TOPROL-XL) 25 MG 24 hr tablet, Take 25 mg by mouth daily., Disp: , Rfl:    montelukast (SINGULAIR) 10 MG tablet, Take 1 tablet (10 mg total) by mouth at bedtime., Disp: 90 tablet, Rfl: 3   neomycin-polymyxin-dexamethasone (MAXITROL) 0.1 % ophthalmic suspension, neomycin-polymyxin-dexameth 3.5 mg/mL-10,000 unit/mL-0.1% eye drops  INSTILL 1 DROP INTO EACH EYE 4 TIMES DAILY FOR 7 DAYS, Disp: , Rfl:    nitroGLYCERIN (NITROSTAT) 0.4 MG SL tablet, Place 1 tablet (0.4 mg total) under the tongue every 5 (five) minutes x 3 doses as needed for chest pain. *If no relief call MD or go to Emergency Room*, Disp: 30 tablet, Rfl: 0   nitroGLYCERIN (NITROSTAT) 0.4 MG SL tablet, Place under the tongue., Disp: , Rfl:    oxybutynin (DITROPAN-XL) 5 MG 24 hr tablet, Take  1 tablet (5 mg total) by mouth daily with supper., Disp: 90 tablet, Rfl: 2   oxycodone (OXY-IR) 5 MG capsule, Take one tab bid fro pain, Disp: 15 capsule, Rfl: 0   oxyCODONE-acetaminophen (PERCOCET/ROXICET) 5-325 MG tablet, Take one tab po bid prn for pain, Disp: 30 tablet, Rfl: 0   potassium chloride (KLOR-CON) 10 MEQ tablet, Take 1 tablet (10 mEq total) by mouth 2 (two) times daily., Disp: 60 tablet, Rfl: 2   tiZANidine (ZANAFLEX) 2 MG tablet, Take 1 tablet (2 mg total) by mouth at bedtime., Disp: 30 tablet, Rfl: 0   zolpidem (AMBIEN) 10 MG tablet, Take 1 tablet (10 mg total) by mouth at bedtime as needed for sleep., Disp: 30 tablet, Rfl: 2  Past Medical History: Past  Medical History:  Diagnosis Date   Asthma    Coronary artery disease    Diabetes mellitus without complication ([place])    Heart attack ([place])    Hyperlipidemia    Hypertension    MI (myocardial infarction) ([place])    Migraine headache with aura    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    Tobacco Use: Social History   Tobacco Use  Smoking Status Never  Smokeless Tobacco Never    Labs: Recent Review Flowsheet Data     Labs for ITP Cardiac and Pulmonary Rehab Latest Ref Rng & Units 11/13/2019 03/06/2020 08/20/2020 11/17/2020 02/19/2021   Cholestrol 100 - 199 mg/dL - - - - -   LDLCALC 0 - 99 mg/dL - - - - -   HDL >39 mg/dL - - - - -   Trlycerides 0 - 149 mg/dL - - - - -   Hemoglobin A1c 4.0 - 5.6 % 6.8(A) 6.9(A) 7.6(A) 7.5(A) 7.0(A)   TCO2 0 - 100 mmol/L - - - - -        Exercise Target Goals: Exercise Program Goal: Individual exercise prescription set using results from initial 6 min walk test and THRR while considering  patient's activity barriers and safety.   Exercise Prescription Goal: Initial exercise prescription builds to 30-45 minutes a day of aerobic activity, 2-3 days per week.  Home exercise guidelines will be given to patient during program as part of exercise prescription that the participant will acknowledge.   Education: Aerobic Exercise: - Group verbal and visual presentation on the components of exercise prescription. Introduces F.I.T.T principle from ACSM for exercise prescriptions.  Reviews F.I.T.T. principles of aerobic exercise including progression. Written material given at graduation.   Education: Resistance Exercise: - Group verbal and visual presentation on the components of exercise prescription. Introduces F.I.T.T principle from ACSM for exercise prescriptions  Reviews F.I.T.T. principles of resistance exercise including progression. Written material given at graduation.    Education: Exercise & Equipment Safety: - Individual verbal instruction and  demonstration of equipment use and safety with use of the equipment. Flowsheet Row Cardiac Rehab from 11/27/2020 in [facility] Cardiac and Pulmonary Rehab  Date 11/27/20  Educator [place]  Instruction Review Code 1- Verbalizes Understanding       Education: Exercise Physiology & General Exercise Guidelines: - Group verbal and written instruction with models to review the exercise physiology of the cardiovascular system and associated critical values. Provides general exercise guidelines with specific guidelines to those with heart or lung disease.    Education: Flexibility, Balance, Mind/Body Relaxation: - Group verbal and visual presentation with interactive activity on the components of exercise prescription. Introduces F.I.T.T principle from ACSM for exercise prescriptions. Reviews F.I.T.T. principles of flexibility  and balance exercise training including progression. Also discusses the mind body connection.  Reviews various relaxation techniques to help reduce and manage stress (i.e. Deep breathing, progressive muscle relaxation, and visualization). Balance handout provided to take home. Written material given at graduation.   Activity Barriers & Risk Stratification:  Activity Barriers & Cardiac Risk Stratification - 11/27/20 1629       Activity Barriers & Cardiac Risk Stratification   Activity Barriers Back Problems;Joint Problems;Arthritis;Deconditioning;Muscular Weakness    Cardiac Risk Stratification High             6 Minute Walk:  6 Minute Walk     Row Name 11/27/20 1633         6 Minute Walk   Phase Initial     Distance 470 feet     Walk Time 6 minutes     # of Rest Breaks 0     MPH 0.89     METS 0.95     RPE 13     Perceived Dyspnea  0     VO2 Peak 3.35     Symptoms Yes (comment)     Comments Right knee- arthritis pain 10/10     Resting HR 66 bpm     Resting BP 104/62     Resting Oxygen Saturation  96 %     Exercise Oxygen Saturation  during 6 min walk 95 %      Max Ex. HR 89 bpm     Max Ex. BP 122/62     2 Minute Post BP 118/64              Oxygen Initial Assessment:   Oxygen Re-Evaluation:   Oxygen Discharge (Final Oxygen Re-Evaluation):   Initial Exercise Prescription:  Initial Exercise Prescription - 11/27/20 1600       Date of Initial Exercise RX and Referring Provider   Date 11/27/20    Referring Provider Serafina Royals MD      Recumbant Bike   Level 1    RPM 60    Watts 15    Minutes 15    METs 1      NuStep   Level 1    SPM 80    Minutes 15    METs 1      Track   Laps 10    Minutes 15    METs 1      Prescription Details   Frequency (times per week) 3    Duration Progress to 30 minutes of continuous aerobic without signs/symptoms of physical distress      Intensity   THRR 40-80% of Max Heartrate 102-138    Ratings of Perceived Exertion 11-13    Perceived Dyspnea 0-4      Progression   Progression Continue to progress workloads to maintain intensity without signs/symptoms of physical distress.      Resistance Training   Training Prescription Yes    Weight 3 lb    Reps 10-15             Perform Capillary Blood Glucose checks as needed.  Exercise Prescription Changes:   Exercise Prescription Changes     Row Name 11/27/20 1600 12/10/20 1300 12/24/20 1500 01/06/21 1500 01/08/21 1600     Response to Exercise   Blood Pressure (Admit) 104/62 112/66 102/62 126/60 --   Blood Pressure (Exercise) 122/62 140/70 124/64 136/74 --   Blood Pressure (Exit) 118/64 110/62 104/70 132/76 --   Heart Rate (Admit) 66 bpm 59  bpm 74 bpm 68 bpm --   Heart Rate (Exercise) 89 bpm 103 bpm 76 bpm 101 bpm --   Heart Rate (Exit) 66 bpm 74 bpm 71 bpm 99 bpm --   Oxygen Saturation (Admit) 96 % -- -- -- --   Oxygen Saturation (Exercise) 95 % -- -- -- --   Oxygen Saturation (Exit) 96 % -- -- -- --   Rating of Perceived Exertion (Exercise) '13 13 11 11 ' --   Perceived Dyspnea (Exercise) 0 -- -- -- --   Symptoms R knee  pain from arthritis 10/10 R knee pain -- R knee pain --   Comments walk test results first full day of exercise -- -- --   Duration -- Progress to 30 minutes of  aerobic without signs/symptoms of physical distress Progress to 30 minutes of  aerobic without signs/symptoms of physical distress Continue with 30 min of aerobic exercise without signs/symptoms of physical distress. --   Intensity -- THRR unchanged THRR unchanged THRR unchanged --     Progression   Progression -- Continue to progress workloads to maintain intensity without signs/symptoms of physical distress. Continue to progress workloads to maintain intensity without signs/symptoms of physical distress. Continue to progress workloads to maintain intensity without signs/symptoms of physical distress. 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  Start with 1 day   Initial Home Exercises Provided -- -- -- -- 01/08/21    Row Name 01/22/21 1100 02/03/21 1000 02/18/21 1500 03/02/21 1200       Response to Exercise   Blood Pressure (Admit) 122/66 110/60 108/64 130/72    Blood Pressure (Exercise) 122/72 126/68 126/72 --    Blood Pressure (Exit) 130/72 128/78 120/68 110/60    Heart Rate (Admit) 86 bpm 60 bpm 86 bpm 91 bpm    Heart Rate (Exercise) 97 bpm 110 bpm 114 bpm 115 bpm    Heart Rate (Exit) 73 bpm 90 bpm 87 bpm 95 bpm    Rating of Perceived Exertion (Exercise) '12 12 12 12    ' Symptoms none knee  pain -- knee pain    Duration Continue with 30 min of aerobic exercise without signs/symptoms of physical distress. Continue with 30 min of aerobic exercise without signs/symptoms of physical distress. Continue with 30 min of aerobic exercise without signs/symptoms of physical distress. Continue with 30 min of aerobic exercise without signs/symptoms of physical distress.    Intensity THRR unchanged THRR unchanged THRR unchanged THRR unchanged         Progression   Progression Continue to progress workloads to maintain intensity without signs/symptoms of physical distress. Continue to progress workloads  to maintain intensity without signs/symptoms of physical distress. Continue to progress workloads to maintain intensity without signs/symptoms of physical distress. Continue to progress workloads to maintain intensity without signs/symptoms of physical distress.    Average METs 2.66 3 3.85 3.95         Resistance Training   Training Prescription Yes Yes Yes Yes    Weight 4 lb 4 lb 4 lb 4 lb    Reps 10-15 10-15 10-15 10-15         Interval Training   Interval Training No No No No         NuStep   Level 4 3 -- 4    Minutes 30 30 -- 30    METs 2.9 3 -- 3.5         REL-XR   Level -- -- 1 1    Minutes -- -- 30 30    METs -- -- 3.85 4.5         Biostep-RELP   Level -- 2 -- --    Minutes -- 30 -- --    METs -- 3 -- --         Home Exercise Plan   Plans to continue exercise at [employer] (comment)  [place] (comment)  [place] (comment)  [place] (comment)  [place] videos    Frequency Add 2 additional days to program exercise sessions.  Start with 1 day Add 2 additional days to program exercise sessions.  Start with 1 day Add 2 additional days to program exercise sessions.  Start with 1 day Add 2 additional days to program exercise sessions.  Start with 1 day     Initial Home Exercises Provided 01/08/21 01/08/21 01/08/21 01/08/21             Exercise Comments:   Exercise Goals and Review:   Exercise Goals     Row Name 11/27/20 1636             Exercise Goals   Increase Physical Activity Yes       Intervention Provide advice, education, support and counseling about physical activity/exercise needs.;Develop an individualized exercise prescription for aerobic and resistive training based on initial evaluation findings, risk stratification, comorbidities and participant's personal goals.       Expected Outcomes Short Term: Attend rehab on a regular basis to increase amount of physical activity.;Long Term: Add in home exercise to make exercise part of routine and to increase amount of physical activity.;Long Term: Exercising regularly at least 3-5 days a week.       Increase Strength and Stamina Yes       Intervention Provide advice, education, support and counseling about physical activity/exercise needs.;Develop an individualized exercise prescription for aerobic and resistive training based on initial evaluation findings, risk stratification, comorbidities and participant's personal goals.       Expected Outcomes Short Term: Increase workloads from initial exercise prescription for resistance, speed, and METs.;Short Term: Perform resistance training exercises routinely during rehab and add in resistance training at home;Long Term: Improve cardiorespiratory fitness, muscular endurance and strength as measured by increased METs and functional capacity (6MWT)       Able to understand and use rate of perceived exertion (RPE) scale Yes       Intervention Provide education and explanation on how to use RPE scale       Expected Outcomes Short Term: Able to use  RPE daily in rehab to express subjective intensity level;Long Term:  Able to use RPE to guide intensity level when exercising independently       Able to understand and use Dyspnea scale Yes        Intervention Provide education and explanation on how to use Dyspnea scale       Expected Outcomes Short Term: Able to use Dyspnea scale daily in rehab to express subjective sense of shortness of breath during exertion;Long Term: Able to use Dyspnea scale to guide intensity level when exercising independently       Knowledge and understanding of Target Heart Rate Range (THRR) Yes       Intervention Provide education and explanation of THRR including how the numbers were predicted and where they are located for reference       Expected Outcomes Short Term: Able to state/look up THRR;Short Term: Able to use daily as guideline for intensity in rehab;Long Term: Able to use THRR to govern intensity when exercising independently       Able to check pulse independently Yes       Intervention Provide education and demonstration on how to check pulse in carotid and radial arteries.;Review the importance of being able to check your own pulse for safety during independent exercise       Expected Outcomes Short Term: Able to explain why pulse checking is important during independent exercise;Long Term: Able to check pulse independently and accurately       Understanding of Exercise Prescription Yes       Intervention Provide education, explanation, and written materials on patient's individual exercise prescription       Expected Outcomes Short Term: Able to explain program exercise prescription;Long Term: Able to explain home exercise prescription to exercise independently                Exercise Goals Re-Evaluation :  Exercise Goals Re-Evaluation     Row Name 12/03/20 1546 12/10/20 1310 12/24/20 1552 01/06/21 1557 01/08/21 1623     Exercise Goal Re-Evaluation   Exercise Goals Review Increase Physical Activity;Able to understand and use rate of perceived exertion (RPE) scale;Knowledge and understanding of Target Heart Rate Range (THRR);Understanding of Exercise Prescription;Increase Strength and  Stamina;Able to check pulse independently Increase Physical Activity;Increase Strength and Stamina;Understanding of Exercise Prescription Increase Physical Activity;Increase Strength and Stamina Increase Physical Activity;Increase Strength and Stamina;Understanding of Exercise Prescription Increase Physical Activity;Increase Strength and Stamina;Understanding of Exercise Prescription   Comments Reviewed RPE and dyspnea scales, THR and program prescription with pt today.  Pt voiced understanding and was given a copy of goals to take home. Jocelyn Lamer has only completed her first full session of exercise.  She left early due to her knee pain this week.  She is hoping to get an injection for it to help manage pain better.  She is going to keep [place] posted on how things are going. Jocelyn Lamer has only attended 3 times this month.  COnsistent attendance is necessary for good progress.  Staff will monitor while in session. Kamyah continues to have inconsistent attendance.  She was here 5 times over the past month total.  She also continues to struggle with her knee.  We will continue to montior her progress and encourage improved attendance. Reviewed home exercise with pt today.  Pt plans to complete Youtube videos and is looking into the [facility]  for exercise.  Reviewed THR, pulse, RPE, sign and symptoms, pulse oximetery and when to call 911 or MD.  Also discussed weather considerations and indoor options.  Pt voiced understanding.   Expected Outcomes Short: Use RPE daily to regulate intensity. Long: Follow program prescription in THR. Short: Get some knee pain relief and return to attendance Long: Follow program prescription Short: get back to consistent attendance Long:  build stamina Short: Regular attendance Long: Continue to improve stamina. Short: Add on 1 day of exercise Long: Exercise independently as home at appropriate exercise prescription    Row Name 01/22/21 1112 01/28/21 1539 02/03/21 1044 02/18/21 1556 02/23/21  1612     Exercise Goal Re-Evaluation   Exercise Goals Review Increase Physical Activity;Increase Strength and Stamina;Understanding of Exercise Prescription Increase Physical Activity;Increase Strength and Stamina;Understanding of Exercise Prescription Increase Physical Activity;Increase Strength and Stamina;Understanding of Exercise Prescription Increase Physical Activity;Increase Strength and Stamina Increase Physical Activity;Increase Strength and Stamina   Comments Caili still continues with inconsistent attendance. However, she has improved to level 4 on the Nustep and jumped up to 4 lbs for her handweights. She should show more improvement if she continuously comes. She should be encouraged to try different machines and walk if tolerated. Kemaria has not been participating in aerobic exercise. She is very limited by her knee as she cannot walk very far without it hurting. She is looking into joining the [employer] and would like the staff to send her information over. She is able to tolerated seated exercises very well. She is hoping to buy a pulse ox to monitor her heart rate as well. She is trying to at least walk to her mailbox and box. Rashi did buy 4 lb handweights and has been doing resistance training at home. Kashena is fair in rehab.  Her knee continues to limit her daily.  She will usually do 30 min on the seated equipment.  We will conitnue to monitor her progress. Chelbie is still at level 1 on XR.  Staff will encourage trying to increase levels.  More consistent attendance would yield better results. Catalea has changed her schedule around to attend more consistently.   Expected Outcomes Short: Exercise on different machine as tolerated/ walk Long: Continue to build up overall strength and stamina Short: Dealer and participate and aerovic exercise Long: Exercise independently at home at appropriate prescription Short: Increase workload on BioStep Long: Conitnue to improve stamina. Short:   increase workloads on seated machines Long:  build overall stamina Short: attend consistently Long: complete HT program    Row Name 03/02/21 1146             Exercise Goal Re-Evaluation   Exercise Goals Review Increase Physical Activity;Increase Strength and Stamina       Comments Gricel has been sporadic with her attendance and is not increasing her workloads. Staff needs to discuss with patient to talk about improvement and moving up. She recently spoke with her doctor who is allowing patient to start walking on the treadmill again which would be beneficial for her. Will continue to monitor.       Expected Outcomes Short: Improve attendance and start walking Long: Increase overall MET level                Discharge Exercise Prescription (Final Exercise Prescription Changes):  Exercise Prescription Changes - 03/02/21 1200       Response to Exercise   Blood Pressure (Admit) 130/72    Blood Pressure (Exit) 110/60    Heart Rate (Admit) 91 bpm    Heart Rate (Exercise) 115 bpm    Heart Rate (  Exit) 95 bpm    Rating of Perceived Exertion (Exercise) 12    Symptoms knee pain    Duration Continue with 30 min of aerobic exercise without signs/symptoms of physical distress.    Intensity THRR unchanged      Progression   Progression Continue to progress workloads to maintain intensity without signs/symptoms of physical distress.    Average METs 3.95      Resistance Training   Training Prescription Yes    Weight 4 lb    Reps 10-15      Interval Training   Interval Training No      NuStep   Level 4    Minutes 30    METs 3.5      REL-XR   Level 1    Minutes 30    METs 4.5      Home Exercise Plan   Plans to continue exercise at [employer] (comment)   [place] videos   Frequency Add 2 additional days to program exercise sessions.   Start with 1 day   Initial Home Exercises Provided 01/08/21             Nutrition:  Target Goals: Understanding of  nutrition guidelines, daily intake of sodium <1562m, cholesterol <2024m calories 30% from fat and 7% or less from saturated fats, daily to have 5 or more servings of fruits and vegetables.  Education: All About Nutrition: -Group instruction provided by verbal, written material, interactive activities, discussions, models, and posters to present general guidelines for heart healthy nutrition including fat, fiber, MyPlate, the role of sodium in heart healthy nutrition, utilization of the nutrition label, and utilization of this knowledge for meal planning. Follow up email sent as well. Written material given at graduation. [facility]  Date 12/08/15  Educator CoJaclyn ShaggyInstruction Review Code (retired) 2- meets goals/outcomes       Biometrics:  Pre Biometrics - 11/27/20 1629       Pre Biometrics   Height '5\' 6"'  (1.676 m)    Weight 246 lb 3.2 oz (111.7 kg)    BMI (Calculated) 39.76    Single Leg Stand 9.6 seconds              Nutrition Therapy Plan and Nutrition Goals:  Nutrition Therapy & Goals - 12/22/20 1528       Nutrition Therapy   Diet Low Na, HH, DM diet    Drug/Food Interactions Statins/Certain Fruits    Protein (specify units) 90g    Fiber 25 grams    Whole Grain Foods 3 servings    Saturated Fats 12 max. grams    Fruits and Vegetables 8 servings/day    Sodium 1.5 grams      Personal Nutrition Goals   Nutrition Goal ST: split white rice with brown rice LT: increase whole grains    Comments 6am will wake up, gets her gradchildren around 7am. B: bowl of oatmeal with 1/2 cup OJ and apple or banana L: the other day had a fruit salad with regular gingerale (hates diet soda), lean cuisine - macoroni wilth salsbury steak S: 1/2 cup popcorn D: porkchops, greens (largest portion), and rice (white rice). S: apple and peanut butter. Drinks: water with flavoring. greens, cabbage, green beans - doesn't like other  vegetables. She will not eat any other vegetables. Husband uses pam cooking spray. Uses Mrs. Dash. Her Bg will run around 102 or 106. A1C: around 6.  She doesn't want to change the way she eats, but is willing to try mixing brown rice with white rice. Discussed heart healthy, diabetes friendly eatinf.      Intervention Plan   Intervention Prescribe, educate and counsel regarding individualized specific dietary modifications aiming towards targeted core components such as weight, hypertension, lipid management, diabetes, heart failure and other comorbidities.;Nutrition handout(s) given to patient.    Expected Outcomes Short Term Goal: Understand basic principles of dietary content, such as calories, fat, sodium, cholesterol and nutrients.;Long Term Goal: Adherence to prescribed nutrition plan.;Short Term Goal: A plan has been developed with personal nutrition goals set during dietitian appointment.             Nutrition Assessments:  MEDIFICTS Score Key: ?70 Need to make dietary changes  40-70 Heart Healthy Diet ? 40 Therapeutic Level Cholesterol Diet  Flowsheet Row Cardiac Rehab from 12/22/2020 in [facility] Cardiac and [facility] Re[facility]  Picture Your Plate Total Score on Admission 50      Picture Your Plate Scores: <30 Unhealthy dietary pattern with much room for improvement. 41-50 Dietary pattern unlikely to meet recommendations for good health and room for improvement. 51-60 More healthful dietary pattern, with some room for improvement.  >60 Healthy dietary pattern, although there may be some specific behaviors that could be improved.    Nutrition Goals Re-Evaluation:  Nutrition Goals Re-Evaluation     [place] Name 01/28/21 1543 02/23/21 1609           Goals   Nutrition Goal ST: split white rice with brown rice LT: increase whole grains --      Comment Manon tried brown rice but did not like it. She doesn't think she can use it in her diet. She does try eat a mix of a white/wheat  bread. She does not eat whole wheat breads as she also does not like the taste of that. She has tried to incoporate it with other flavors to help. She is looking to lose weight and has eliminated her sweet snacks. She opted to get light frozen yogurt to replace her normal ice cream. Jasleen has goven up sweets and eats oranges, apples and bananas.  She likes white wheat bread and gets that instead of wheat.      Expected Outcome Short: Continue to increase whole grains in diet Long: Continue to maintain heart healthy diet Short: continue to try new whole grain foods Long: maintain heart healthy diet               Nutrition Goals Discharge (Final Nutrition Goals Re-Evaluation):  Nutrition Goals Re-Evaluation - 02/23/21 1609       Goals   Comment Pantera has goven up sweets and eats oranges, apples and bananas.  She likes white wheat bread and gets that instead of wheat.    Expected Outcome Short: continue to try new whole grain foods Long: maintain heart healthy diet             Psychosocial: Target Goals: Acknowledge presence or absence of significant depression and/or stress, maximize coping skills, provide positive support system. Participant is able to verbalize types and ability to use techniques and skills needed for reducing stress and depression.   Education: Stress, Anxiety, and Depression - Group verbal and visual presentation to define topics covered.  Reviews how body is impacted by stress, anxiety, and depression.  Also discusses healthy ways to reduce stress and to treat/manage anxiety and depression.  Written material given at graduation. [place] [facility]  from 02/09/2016 in [facility] Pul[facility] Rehab  Date 12/03/15  Educator Kathreen Cornfield, [facility]  Instruction Review Code (retired) 2- meets goals/outcomes       Education: Sleep Hygiene -Provides group verbal and written instruction about how sleep can affect your health.  Define sleep hygiene, discuss sleep  cycles and impact of sleep habits. Review good sleep hygiene tips.    Initial Review & Psychosocial Screening:  Initial Psych Review & Screening - 11/21/20 1430       Initial Review   Current issues with History of Depression;Current Stress Concerns      Family Dynamics   Good Support System? Yes      Barriers   Psychosocial barriers to participate in program The patient should benefit from training in stress management and relaxation.;There are no identifiable barriers or psychosocial needs.      Screening Interventions   Interventions Encouraged to exercise;Provide feedback about the scores to participant;To provide support and resources with identified psychosocial needs    Expected Outcomes Short Term goal: Utilizing psychosocial counselor, staff and physician to assist with identification of specific Stressors or current issues interfering with healing process. Setting desired goal for each stressor or current issue identified.;Long Term Goal: Stressors or current issues are controlled or eliminated.;Short Term goal: Identification and review with participant of any Quality of Life or Depression concerns found by scoring the questionnaire.;Long Term goal: The participant improves quality of Life and PHQ9 Scores as seen by post scores and/or verbalization of changes             Quality of Life Scores:   Quality of Life - 11/27/20 1650       Quality of Life Scores   Health/Function Pre 22.65 %    Socioeconomic Pre 24.75 %    Psych/Spiritual Pre 28.29 %    Family Pre 28.8 %    GLOBAL Pre 25.36 %            Scores of 19 and below usually indicate a poorer quality of life in these areas.  A difference of  2-3 points is a clinically meaningful difference.  A difference of 2-3 points in the total score of the Quality of Life Index has been associated with significant improvement in overall quality of life, self-image, physical symptoms, and general health in studies assessing  change in quality of life.  PHQ-9: Recent Review Flowsheet Data     Depression screen [facility] 2/9 03/12/2021 02/19/2021 11/27/2020 11/17/2020 08/20/2020   Decreased Interest 0 0 0 0 0   Down, Depressed, Hopeless 0 0 0 0 0   PHQ - 2 Score 0 0 0 0 0   Altered sleeping - - 0 - -   Tired, decreased energy - - 0 - -   Change in appetite - - 0 - -   Feeling bad or failure about yourself  - - 0 - -   Trouble concentrating - - 0 - -   Moving slowly or fidgety/restless - - 0 - -   Suicidal thoughts - - 0 - -   PHQ-9 Score - - 0 - -   Difficult doing work/chores - - Not difficult at all - -      Interpretation of Total Score  Total Score Depression Severity:  1-4 = Minimal depression, 5-9 = Mild depression, 10-14 = Moderate depression, 15-19 = Moderately severe depression, 20-27 = Severe depression   Psychosocial Evaluation and Intervention:  Psychosocial Evaluation - 11/21/20 1438  Psychosocial Evaluation & Interventions   Comments Aayana is returning to Cardiac Rehab with another stent. She did some of the program in 2020. She states she is doing well and "doesn't let things stress me out." She reports to have a good support system, sleeping well, and is enjoying her life day to day. She is looking forward to coming to Cardiac Rehab to start back exercising and lose weight and hopes to get into a good routine.    Expected Outcomes Short: attend cardiac rehab for education and exercise. Long: develop and maintain positive self care habits.    Continue Psychosocial Services  Follow up required by staff             Psychosocial Re-Evaluation:  Psychosocial Re-Evaluation     [place] Name 01/01/21 1608 01/28/21 1550 02/23/21 1611         Psychosocial Re-Evaluation   Current issues with None Identified None Identified None Identified     Comments Patient reports no issues with their current mental states, sleep, stress, depression or anxiety. Will follow up with patient in a few weeks for  any changes. Seraphim denies any concerns with depression, anxiety, or big stressors. She has good support from husband and kids. Feels exercise has helped her feel stronger even though she's limited by walking, Joeanne says she doesnt let things stress her out.     Expected Outcomes Short: Continue to exercise regularly to support mental health and notify staff of any changes. Long: maintain mental health and well being through teaching of rehab or prescribed medications independently. Short: Continue attending rehab consistently Long: Continue to exercise for stress management and maintain posititve attitude ST/LT: maintain positive outlook and low stress     Interventions Encouraged to attend Cardiac Rehabilitation for the exercise Encouraged to attend Cardiac Rehabilitation for the exercise --     Continue Psychosocial Services  Follow up required by staff Follow up required by staff --              Psychosocial Discharge (Final Psychosocial Re-Evaluation):  Psychosocial Re-Evaluation - 02/23/21 1611       Psychosocial Re-Evaluation   Current issues with None Identified    Comments Kristyana says she doesnt let things stress her out.    Expected Outcomes ST/LT: maintain positive outlook and low stress             Vocational Rehabilitation: Provide vocational rehab assistance to qualifying candidates.   Vocational Rehab Evaluation & Intervention:  Vocational Rehab - 11/21/20 1430       Initial Vocational Rehab Evaluation & Intervention   Assessment shows need for Vocational Rehabilitation No             Education: Education Goals: Education classes will be provided on a variety of topics geared toward better understanding of heart health and risk factor modification. Participant will state understanding/return demonstration of topics presented as noted by education test scores.  Learning Barriers/Preferences:  Learning Barriers/Preferences - 11/21/20 1430       Learning  Barriers/Preferences   Learning Barriers Sight   glasses   Learning Preferences Individual Instruction;Verbal Instruction             General Cardiac Education Topics:  AED/CPR: - Group verbal and written instruction with the use of models to demonstrate the basic use of the AED with the basic ABC's of resuscitation.   Anatomy and Cardiac Procedures: - Group verbal and visual presentation and models provide information about basic cardiac anatomy and  function. Reviews the testing methods done to diagnose heart disease and the outcomes of the test results. Describes the treatment choices: Medical Management, Angioplasty, or Coronary Bypass Surgery for treating various heart conditions including Myocardial Infarction, Angina, Valve Disease, and Cardiac Arrhythmias.  Written material given at graduation.   Medication Safety: - Group verbal and visual instruction to review commonly prescribed medications for heart and lung disease. Reviews the medication, class of the drug, and side effects. Includes the steps to properly store meds and maintain the prescription regimen.  Written material given at graduation.   Intimacy: - Group verbal instruction through game format to discuss how heart and lung disease can affect sexual intimacy. Written material given at graduation..   Know Your Numbers and Heart Failure: - Group verbal and visual instruction to discuss disease risk factors for cardiac and pulmonary disease and treatment options.  Reviews associated critical values for Overweight/Obesity, Hypertension, Cholesterol, and Diabetes.  Discusses basics of heart failure: signs/symptoms and treatments.  Introduces Heart Failure Zone chart for action plan for heart failure.  Written material given at graduation.   Infection Prevention: - Provides verbal and written material to individual with discussion of infection control including proper hand washing and proper equipment cleaning during  exercise session. Flowsheet Row Cardiac Rehab from 11/27/2020 in [facility] Cardiac and Pulmonary Rehab  Date 11/27/20  Educator [place]  Instruction Review Code 1- Verbalizes Understanding       Falls Prevention: - Provides verbal and written material to individual with discussion of falls prevention and safety. Flowsheet Row Cardiac Rehab from 11/27/2020 in [facility] Cardiac and Pulmonary Rehab  Date 11/27/20  Educator [place]  Instruction Review Code 1- Verbalizes Understanding       Other: -Provides group and verbal instruction on various topics (see comments)   Knowledge Questionnaire Score:  Knowledge Questionnaire Score - 11/27/20 1639       Knowledge Questionnaire Score   Pre Score 18/26: Heart attack, HF, Nutrition, Stress, Exercise, Nutrition             Core Components/Risk Factors/Patient Goals at Admission:  Personal Goals and Risk Factors at Admission - 11/27/20 1637       Core Components/Risk Factors/Patient Goals on Admission    Weight Management Yes;Obesity;Weight Loss    Intervention Weight Management: Develop a combined nutrition and exercise program designed to reach desired caloric intake, while maintaining appropriate intake of nutrient and fiber, sodium and fats, and appropriate energy expenditure required for the weight goal.;Weight Management: Provide education and appropriate resources to help participant work on and attain dietary goals.;Weight Management/Obesity: Establish reasonable short term and long term weight goals.;Obesity: Provide education and appropriate resources to help participant work on and attain dietary goals.    Admit Weight 246 lb (111.6 kg)    Goal Weight: Short Term 240 lb (108.9 kg)    Goal Weight: Long Term 200 lb (90.7 kg)    Expected Outcomes Short Term: Continue to assess and modify interventions until short term weight is achieved;Long Term: Adherence to nutrition and physical activity/exercise program aimed toward attainment of  established weight goal;Weight Loss: Understanding of general recommendations for a balanced deficit meal plan, which promotes 1-2 lb weight loss per week and includes a negative energy balance of [phone] kcal/d;Understanding recommendations for meals to include 15-35% energy as protein, 25-35% energy from fat, 35-60% energy from carbohydrates, less than 239m of dietary cholesterol, 20-35 gm of total fiber daily;Understanding of distribution of calorie intake throughout the day with the consumption of  4-5 meals/snacks    Diabetes Yes    Intervention Provide education about signs/symptoms and action to take for hypo/hyperglycemia.;Provide education about proper nutrition, including hydration, and aerobic/resistive exercise prescription along with prescribed medications to achieve blood glucose in normal ranges: Fasting glucose 65-99 mg/dL    Expected Outcomes Short Term: Participant verbalizes understanding of the signs/symptoms and immediate care of hyper/hypoglycemia, proper foot care and importance of medication, aerobic/resistive exercise and nutrition plan for blood glucose control.;Long Term: Attainment of HbA1C < 7%.    Hypertension Yes    Intervention Provide education on lifestyle modifcations including regular physical activity/exercise, weight management, moderate sodium restriction and increased consumption of fresh fruit, vegetables, and low fat dairy, alcohol moderation, and smoking cessation.;Monitor prescription use compliance.    Expected Outcomes Short Term: Continued assessment and intervention until BP is < 140/1m HG in hypertensive participants. < 130/867mHG in hypertensive participants with diabetes, heart failure or chronic kidney disease.;Long Term: Maintenance of blood pressure at goal levels.    Lipids Yes    Intervention Provide education and support for participant on nutrition & aerobic/resistive exercise along with prescribed medications to achieve LDL <7056mHDL >1m12m   Expected Outcomes Short Term: Participant states understanding of desired cholesterol values and is compliant with medications prescribed. Participant is following exercise prescription and nutrition guidelines.;Long Term: Cholesterol controlled with medications as prescribed, with individualized exercise RX and with personalized nutrition plan. Value goals: LDL < 70mg33mL > 40 mg.             Education:Diabetes - Individual verbal and written instruction to review signs/symptoms of diabetes, desired ranges of glucose level fasting, after meals and with exercise. Acknowledge that pre and post exercise glucose checks will be done for 3 sessions at entry of program. [place] [facility]  Education need identified 11/27/20  Date 11/27/20  Educator [place]  IHomertruction Review Code 1- Verbalizes Understanding       Core Components/Risk Factors/Patient Goals Review:   Goals and Risk Factor Review     Row Name 01/01/21 1605 01/28/21 1545 02/23/21 1607         Core Components/Risk Factors/Patient Goals Review   Personal Goals Review Weight Management/Obesity;Hypertension;Diabetes Weight Management/Obesity;Hypertension;Diabetes Weight Management/Obesity;Hypertension;Diabetes     Review Danayah has been checking her blood pressure daily, once in the morning and once at night. She checks her sugar in the morning before she eats. Her blood sugar has been around 90-100 before she eats. She wants to work on weight loss. Maily continues to check her sugars every morning, states its around 118 as a fasting glucose before breakfast. Her BP cuff broke and she is planning on getting a new one so she has not checked her BP. She wants to lose weight- she has been giving up her sweets, especially ice cream. She is opting to get frozen yogurt- light version instead. Her goal weight is 180 lb and is hoping her exericse will help too. Vivkie says BG has been  between 100-101.  A1C was 7 at  check last week.  She can tell her clothes fit loose.     Expected Outcomes Short: lose 5 pounds in the next two weeks. Long: reach a weight goal of 200. Short: Continue making modifcations to diet to promote weight loss Long: Continue to manage lifestyle risk factors Short:  continue current diet and exercise Long: manage risk factors long term  Core Components/Risk Factors/Patient Goals at Discharge (Final Review):   Goals and Risk Factor Review - 02/23/21 1607       Core Components/Risk Factors/Patient Goals Review   Personal Goals Review Weight Management/Obesity;Hypertension;Diabetes    Review Vivkie says BG has been between 100-101.  A1C was 7 at  check last week.  She can tell her clothes fit loose.    Expected Outcomes Short:  continue current diet and exercise Long: manage risk factors long term             ITP Comments:  ITP Comments     Row Name 11/21/20 1432 11/27/20 1704 12/03/20 1546 12/17/20 0930 12/18/20 1649   ITP Comments Initial telephone orienation completed. Diagnosis can be found in [facility] 5/3. EP orientation scheduled for Thursday 5/26 at 3pm. Completed 6MWT and gym orientation. Initial ITP created and sent for review to Dr. Emily Filbert, Medical Director. First full day of exercise!  Patient was oriented to gym and equipment including functions, settings, policies, and procedures.  Patient's individual exercise prescription and treatment plan were reviewed.  All starting workloads were established based on the results of the 6 minute walk test done at initial orientation visit.  The plan for exercise progression was also introduced and progression will be customized based on patient's performance and goals. 30 Day review completed. Medical Director ITP review done, changes made as directed, and signed approval by Medical Director.  2 visits in June Myrna reports she will be getting her left knee replaced next month. Before  then we will need to discharge her and when she is ready to return she can get a new referral to come back to rehab. Until then she will continue to come to rehab. She will know by the end of June what the date is.    [place] Name 01/14/21 1208 02/11/21 0834 03/02/21 1727 03/11/21 0807 03/11/21 1227   ITP Comments 30 Day review completed. Medical Director ITP review done, changes made as directed, and signed approval by Medical Director. 30 Day review completed. Medical Director ITP review done, changes made as directed, and signed approval by Medical Director. Patient's attendance has been sporadic. Staff spoke with patient today and reiterated the attendance policy, stating she only has 18 weeks to complete the program and may not benefit from it if her attendance is poor. Patient was reminded to call if she can't come. Patient understood and agreed and states she will try to improve her coming. 30 Day review completed. Medical Director ITP review done, changes made as directed, and signed approval by Medical Director. Alaysha informed staff that she has been in the ED with side pain last week. She was discharged without a clear answer as to what is causing her the discomfort. She was going to the walk in clinic today for a painful spot on her leg so she will miss class today. Tomorrow she has two follow up appointments with her primary and cardiologist. She hopes to return to class next week.    [place] Name 03/12/21 1420 03/30/21 1041 04/08/21 1440       ITP Comments Patient called and stated she will be out of rehab next week; per patient she saw her doctor who states she may have pneumonia or fluid in lungs. Patient will keep [place] updated and will receive clearance to come back when she feels ready to do so. Currently waiting on CT results. Carrisa has not attended [facility] since the end of August. Spoke with patient  who recently saw her cardiologist on Friday who is referring her to a pulmonologist. Patient  will call us after she completed her consultation and re-evaluate. Patient will need clearance to come back and/or discuss possible discharge pending results. 30 day review completed. ITP sent to Dr. Emily Filbert, Medical Director of Cardiac Rehab. Continue with ITP unless changes are made by physician.    Patient continues to be out on hold for medical reasons. Last attended 03/03/21.              Comments: 30 day review

## 2021-04-09 ENCOUNTER — Ambulatory Visit: Payer: Medicare Other | Admitting: Internal Medicine

## 2021-04-09 ENCOUNTER — Ambulatory Visit: Payer: Medicare Other

## 2021-04-12 ENCOUNTER — Other Ambulatory Visit: Payer: Self-pay | Admitting: Internal Medicine

## 2021-04-12 DIAGNOSIS — I119 Hypertensive heart disease without heart failure: Secondary | ICD-10-CM

## 2021-04-13 ENCOUNTER — Ambulatory Visit: Payer: Medicare Other

## 2021-04-15 ENCOUNTER — Ambulatory Visit: Payer: Medicare Other

## 2021-04-15 ENCOUNTER — Telehealth: Payer: Self-pay

## 2021-04-15 NOTE — Progress Notes (Signed)
Felicia Woods is supposed to speak with pulmonologist today.  She will call us to let us know if she wants to discharge today or tomorrow.

## 2021-04-15 NOTE — Telephone Encounter (Signed)
Felicia Woods is supposed to speak with pulmonologist today.  She will call us to let us know if she wants to discharge today or tomorrow.

## 2021-04-16 ENCOUNTER — Ambulatory Visit: Payer: Medicare Other

## 2021-04-18 ENCOUNTER — Other Ambulatory Visit: Payer: Self-pay | Admitting: Internal Medicine

## 2021-04-18 DIAGNOSIS — F324 Major depressive disorder, single episode, in partial remission: Secondary | ICD-10-CM

## 2021-04-18 DIAGNOSIS — M542 Cervicalgia: Secondary | ICD-10-CM

## 2021-04-20 ENCOUNTER — Ambulatory Visit: Payer: Medicare Other

## 2021-04-21 ENCOUNTER — Telehealth: Payer: Self-pay

## 2021-04-21 ENCOUNTER — Other Ambulatory Visit: Payer: Self-pay

## 2021-04-21 MED ORDER — SERTRALINE HCL 100 MG PO TABS: 100.0000 mg | ORAL_TABLET | Freq: Every day | ORAL | 1 refills | Status: DC

## 2021-04-21 MED ORDER — CYCLOBENZAPRINE HCL 10 MG PO TABS: 10.0000 mg | ORAL_TABLET | Freq: Every day | ORAL | 1 refills | Status: DC

## 2021-04-22 ENCOUNTER — Ambulatory Visit: Payer: Medicare Other

## 2021-04-23 ENCOUNTER — Ambulatory Visit: Payer: Medicare Other

## 2021-04-23 DIAGNOSIS — Z955 Presence of coronary angioplasty implant and graft: Secondary | ICD-10-CM

## 2021-04-23 NOTE — Progress Notes (Signed)
Discharge Progress Report  Patient Details  Name: Felicia Woods MRN: [id number] Date of Birth: 06/17/1956 Referring Provider:   Flowsheet Row Cardiac Rehab from 11/27/2020 in [facility]  Referring Provider Serafina Royals MD        Number of Visits: 17  Reason for Discharge:  Early Exit:  Lack of attendance/ Medical  Smoking History:  Social History   Tobacco Use  Smoking Status Never  Smokeless Tobacco Never    Diagnosis:  S/P coronary artery stent placement  ADL [facility]:   Initial Exercise Prescription:  Initial Exercise Prescription - 11/27/20 1600       Date of Initial Exercise RX and Referring Provider   Date 11/27/20    Referring Provider Serafina Royals MD      Recumbant Bike   Level 1    RPM 60    Watts 15    Minutes 15    METs 1      NuStep   Level 1    SPM 80    Minutes 15    METs 1      Track   Laps 10    Minutes 15    METs 1      Prescription Details   Frequency (times per week) 3    Duration Progress to 30 minutes of continuous aerobic without signs/symptoms of physical distress      Intensity   THRR 40-80% of Max Heartrate 102-138    Ratings of Perceived Exertion 11-13    Perceived Dyspnea 0-4      Progression   Progression Continue to progress workloads to maintain intensity without signs/symptoms of physical distress.      Resistance Training   Training Prescription Yes    Weight 3 lb    Reps 10-15             Discharge Exercise Prescription (Final Exercise Prescription Changes):  Exercise Prescription Changes - 03/02/21 1200       Response to Exercise   Blood Pressure (Admit) 130/72    Blood Pressure (Exit) 110/60    Heart Rate (Admit) 91 bpm    Heart Rate (Exercise) 115 bpm    Heart Rate (Exit) 95 bpm    Rating of Perceived Exertion (Exercise) 12    Symptoms knee pain    Duration Continue with 30 min of aerobic exercise without signs/symptoms of physical distress.    Intensity THRR  unchanged      Progression   Progression Continue to progress workloads to maintain intensity without signs/symptoms of physical distress.    Average METs 3.95      Resistance Training   Training Prescription Yes    Weight 4 lb    Reps 10-15      Interval Training   Interval Training No      NuStep   Level 4    Minutes 30    METs 3.5      REL-XR   Level 1    Minutes 30    METs 4.5      Home Exercise Plan   Plans to continue exercise at [employer] (comment)   [place] videos   Frequency Add 2 additional days to program exercise sessions.   Start with 1 day   Initial Home Exercises Provided 01/08/21             Functional Capacity:  6 Minute Walk     Row Name 11/27/20 [phone]  6 Minute Walk   Phase Initial     Distance 470 feet     Walk Time 6 minutes     # of Rest Breaks 0     MPH 0.89     METS 0.95     RPE 13     Perceived Dyspnea  0     VO2 Peak 3.35     Symptoms Yes (comment)     Comments Right knee- arthritis pain 10/10     Resting HR 66 bpm     Resting BP 104/62     Resting Oxygen Saturation  96 %     Exercise Oxygen Saturation  during 6 min walk 95 %     Max Ex. HR 89 bpm     Max Ex. BP 122/62     2 Minute Post BP 118/64               Nutrition & Weight - Outcomes:  Pre Biometrics - 11/27/20 1629       Pre Biometrics   Height 5\' 6"  (1.676 m)    Weight 246 lb 3.2 oz (111.7 kg)    BMI (Calculated) 39.76    Single Leg Stand 9.6 seconds              Nutrition:  Nutrition Therapy & Goals - 12/22/20 1528       Nutrition Therapy   Diet Low Na, HH, DM diet    Drug/Food Interactions Statins/Certain Fruits    Protein (specify units) 90g    Fiber 25 grams    Whole Grain Foods 3 servings    Saturated Fats 12 max. grams    Fruits and Vegetables 8 servings/day    Sodium 1.5 grams      Personal Nutrition Goals   Nutrition Goal ST: split white rice with brown rice LT: increase whole grains    Comments 6am  will wake up, gets her gradchildren around 7am. B: bowl of oatmeal with 1/2 cup OJ and apple or banana L: the other day had a fruit salad with regular gingerale (hates diet soda), lean cuisine - macoroni wilth salsbury steak S: 1/2 cup popcorn D: porkchops, greens (largest portion), and rice (white rice). S: apple and peanut butter. Drinks: water with flavoring. greens, cabbage, green beans - doesn't like other vegetables. She will not eat any other vegetables. Husband uses pam cooking spray. Uses Mrs. Dash. Her Bg will run around 102 or 106. A1C: around 6. She doesn't want to change the way she eats, but is willing to try mixing brown rice with white rice. Discussed heart healthy, diabetes friendly eatinf.      Intervention Plan   Intervention Prescribe, educate and counsel regarding individualized specific dietary modifications aiming towards targeted core components such as weight, hypertension, lipid management, diabetes, heart failure and other comorbidities.;Nutrition handout(s) given to patient.    Expected Outcomes Short Term Goal: Understand basic principles of dietary content, such as calories, fat, sodium, cholesterol and nutrients.;Long Term Goal: Adherence to prescribed nutrition plan.;Short Term Goal: A plan has been developed with personal nutrition goals set during dietitian appointment.             Goals reviewed with patient; copy given to patient.

## 2021-04-23 NOTE — Progress Notes (Signed)
Cardiac Individual Treatment Plan  Patient Details  Name: Felicia Woods MRN: [id number] Date of Birth: April 11, 1956 Referring Provider:   Flowsheet Row Cardiac Rehab from 11/27/2020 in [facility] Cardiac and Pulmonary Rehab  Referring Provider Serafina Royals MD       Initial Encounter Date:  Flowsheet Row Cardiac Rehab from 11/27/2020 in [facility] Cardiac and Pulmonary Rehab  Date 11/27/20       Visit Diagnosis: S/P coronary artery stent placement  Patient's Home Medications on Admission:  Current Outpatient Medications:    Accu-Chek Softclix Lancets lancets, Use as instructed twice a day to check blood sugars  DIAG -E11.59, Disp: 100 each, Rfl: 3   albuterol (PROVENTIL HFA;VENTOLIN HFA) 108 (90 Base) MCG/ACT inhaler, Inhale 2 puffs into the lungs every 6 (six) hours as needed for wheezing or shortness of breath., Disp: , Rfl:    amLODipine (NORVASC) 10 MG tablet, Take 1 tablet (10 mg total) by mouth daily., Disp: 90 tablet, Rfl: 1   aspirin EC 81 MG tablet, Take 81 mg by mouth daily., Disp: , Rfl:    atorvastatin (LIPITOR) 80 MG tablet, Take 1 tablet (80 mg total) by mouth daily., Disp: 30 tablet, Rfl: 2   clopidogrel (PLAVIX) 75 MG tablet, Take 1 tablet (75 mg total) by mouth daily., Disp: 90 tablet, Rfl: 3   cyclobenzaprine (FLEXERIL) 10 MG tablet, Take 1 tablet (10 mg total) by mouth at bedtime., Disp: 30 tablet, Rfl: 1   dapagliflozin propanediol (FARXIGA) 10 MG TABS tablet, Farxiga 10 mg tablet, Disp: , Rfl:    Dulaglutide (TRULICITY) 1.5 ZY/2.4MG SOPN, INJECT 1.5MG INTO THE SKIN ONCE A WEEK, Disp: 12 mL, Rfl: 1   famotidine (PEPCID) 20 MG tablet, Take 1 tablet (20 mg total) by mouth 2 (two) times daily., Disp: 60 tablet, Rfl: 2   furosemide (LASIX) 40 MG tablet, Take 1 tablet (40 mg total) by mouth 2 (two) times daily., Disp: 180 tablet, Rfl: 1   glucose blood (ACCU-CHEK GUIDE) test strip, Use as instructed to check blood sugars twice a day  E11.59, Disp: 100 each, Rfl: 3   INVOKANA 300  MG TABS tablet, TAKE 1 TABLET BY MOUTH ONCE DAILY BEFORE BREAKFAST, Disp: 90 tablet, Rfl: 1   isosorbide mononitrate (IMDUR) 60 MG 24 hr tablet, Take 60 mg by mouth daily., Disp: , Rfl:    levofloxacin (LEVAQUIN) 500 MG tablet, Take 1 tablet (500 mg total) by mouth daily., Disp: 10 tablet, Rfl: 0   lidocaine (LIDODERM) 5 %, Place 1 patch onto the skin every 12 (twelve) hours. Remove & Discard patch within 12 hours or as directed by MD, Disp: 10 patch, Rfl: 0   losartan (COZAAR) 100 MG tablet, Take 1 tablet (100 mg total) by mouth daily. Take one tab po qd, Disp: 90 tablet, Rfl: 1   metoprolol succinate (TOPROL-XL) 25 MG 24 hr tablet, Take 25 mg by mouth daily., Disp: , Rfl:    montelukast (SINGULAIR) 10 MG tablet, Take 1 tablet (10 mg total) by mouth at bedtime., Disp: 90 tablet, Rfl: 3   neomycin-polymyxin-dexamethasone (MAXITROL) 0.1 % ophthalmic suspension, neomycin-polymyxin-dexameth 3.5 mg/mL-10,000 unit/mL-0.1% eye drops  INSTILL 1 DROP INTO EACH EYE 4 TIMES DAILY FOR 7 DAYS, Disp: , Rfl:    nitroGLYCERIN (NITROSTAT) 0.4 MG SL tablet, Place under the tongue., Disp: , Rfl:    nitroGLYCERIN (NITROSTAT) 0.4 MG SL tablet, DISSOLVE ONE TABLET UNDER THE TONGUE EVERY 5 MINUTES AS NEEDED FOR CHEST PAIN.  DO NOT EXCEED A TOTAL OF 3 DOSES IN  15 MINUTES, Disp: 30 tablet, Rfl: 3   oxybutynin (DITROPAN-XL) 5 MG 24 hr tablet, Take 1 tablet (5 mg total) by mouth daily with supper., Disp: 90 tablet, Rfl: 2   oxycodone (OXY-IR) 5 MG capsule, Take one tab bid fro pain, Disp: 15 capsule, Rfl: 0   oxyCODONE-acetaminophen (PERCOCET/ROXICET) 5-325 MG tablet, Take one tab po bid prn for pain, Disp: 30 tablet, Rfl: 0   potassium chloride (KLOR-CON) 10 MEQ tablet, Take 1 tablet (10 mEq total) by mouth 2 (two) times daily., Disp: 60 tablet, Rfl: 2   sertraline (ZOLOFT) 100 MG tablet, Take 1 tablet (100 mg total) by mouth daily., Disp: 30 tablet, Rfl: 1   zolpidem (AMBIEN) 10 MG tablet, Take 1 tablet (10 mg total) by  mouth at bedtime as needed for sleep., Disp: 30 tablet, Rfl: 2  Past Medical History: Past Medical History:  Diagnosis Date   Asthma    Coronary artery disease    Diabetes mellitus without complication ([place])    Heart attack ([place])    Hyperlipidemia    Hypertension    MI (myocardial infarction) ([place])    Migraine headache with aura    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    Tobacco Use: Social History   Tobacco Use  Smoking Status Never  Smokeless Tobacco Never    Labs: Recent Review Flowsheet Data     Labs for ITP Cardiac and Pulmonary Rehab Latest Ref Rng & Units 11/13/2019 03/06/2020 08/20/2020 11/17/2020 02/19/2021   Cholestrol 100 - 199 mg/dL - - - - -   LDLCALC 0 - 99 mg/dL - - - - -   HDL >39 mg/dL - - - - -   Trlycerides 0 - 149 mg/dL - - - - -   Hemoglobin A1c 4.0 - 5.6 % 6.8(A) 6.9(A) 7.6(A) 7.5(A) 7.0(A)   TCO2 0 - 100 mmol/L - - - - -        Exercise Target Goals: Exercise Program Goal: Individual exercise prescription set using results from initial 6 min walk test and THRR while considering  patient's activity barriers and safety.   Exercise Prescription Goal: Initial exercise prescription builds to 30-45 minutes a day of aerobic activity, 2-3 days per week.  Home exercise guidelines will be given to patient during program as part of exercise prescription that the participant will acknowledge.   Education: Aerobic Exercise: - Group verbal and visual presentation on the components of exercise prescription. Introduces F.I.T.T principle from ACSM for exercise prescriptions.  Reviews F.I.T.T. principles of aerobic exercise including progression. Written material given at graduation.   Education: Resistance Exercise: - Group verbal and visual presentation on the components of exercise prescription. Introduces F.I.T.T principle from ACSM for exercise prescriptions  Reviews F.I.T.T. principles of resistance exercise including progression. Written material given at  graduation.    Education: Exercise & Equipment Safety: - Individual verbal instruction and demonstration of equipment use and safety with use of the equipment. Flowsheet Row Cardiac Rehab from 11/27/2020 in [facility] Cardiac and Pulmonary Rehab  Date 11/27/20  Educator [place]  Instruction Review Code 1- Verbalizes Understanding       Education: Exercise Physiology & General Exercise Guidelines: - Group verbal and written instruction with models to review the exercise physiology of the cardiovascular system and associated critical values. Provides general exercise guidelines with specific guidelines to those with heart or lung disease.    Education: Flexibility, Balance, Mind/Body Relaxation: - Group verbal and visual presentation with interactive activity on the components  of exercise prescription. Introduces F.I.T.T principle from ACSM for exercise prescriptions. Reviews F.I.T.T. principles of flexibility and balance exercise training including progression. Also discusses the mind body connection.  Reviews various relaxation techniques to help reduce and manage stress (i.e. Deep breathing, progressive muscle relaxation, and visualization). Balance handout provided to take home. Written material given at graduation.   Activity Barriers & Risk Stratification:  Activity Barriers & Cardiac Risk Stratification - 11/27/20 1629       Activity Barriers & Cardiac Risk Stratification   Activity Barriers Back Problems;Joint Problems;Arthritis;Deconditioning;Muscular Weakness    Cardiac Risk Stratification High             6 Minute Walk:  6 Minute Walk     Row Name 11/27/20 1633         6 Minute Walk   Phase Initial     Distance 470 feet     Walk Time 6 minutes     # of Rest Breaks 0     MPH 0.89     METS 0.95     RPE 13     Perceived Dyspnea  0     VO2 Peak 3.35     Symptoms Yes (comment)     Comments Right knee- arthritis pain 10/10     Resting HR 66 bpm     Resting BP 104/62      Resting Oxygen Saturation  96 %     Exercise Oxygen Saturation  during 6 min walk 95 %     Max Ex. HR 89 bpm     Max Ex. BP 122/62     2 Minute Post BP 118/64              Oxygen Initial Assessment:   Oxygen Re-Evaluation:   Oxygen Discharge (Final Oxygen Re-Evaluation):   Initial Exercise Prescription:  Initial Exercise Prescription - 11/27/20 1600       Date of Initial Exercise RX and Referring Provider   Date 11/27/20    Referring Provider Serafina Royals MD      Recumbant Bike   Level 1    RPM 60    Watts 15    Minutes 15    METs 1      NuStep   Level 1    SPM 80    Minutes 15    METs 1      Track   Laps 10    Minutes 15    METs 1      Prescription Details   Frequency (times per week) 3    Duration Progress to 30 minutes of continuous aerobic without signs/symptoms of physical distress      Intensity   THRR 40-80% of Max Heartrate 102-138    Ratings of Perceived Exertion 11-13    Perceived Dyspnea 0-4      Progression   Progression Continue to progress workloads to maintain intensity without signs/symptoms of physical distress.      Resistance Training   Training Prescription Yes    Weight 3 lb    Reps 10-15             Perform Capillary Blood Glucose checks as needed.  Exercise Prescription Changes:   Exercise Prescription Changes     Row Name 11/27/20 1600 12/10/20 1300 12/24/20 1500 01/06/21 1500 01/08/21 1600     Response to Exercise   Blood Pressure (Admit) 104/62 112/66 102/62 126/60 --   Blood Pressure (Exercise) 122/62 140/70 124/64 136/74 --  Blood Pressure (Exit) 118/64 110/62 104/70 132/76 --   Heart Rate (Admit) 66 bpm 59 bpm 74 bpm 68 bpm --   Heart Rate (Exercise) 89 bpm 103 bpm 76 bpm 101 bpm --   Heart Rate (Exit) 66 bpm 74 bpm 71 bpm 99 bpm --   Oxygen Saturation (Admit) 96 % -- -- -- --   Oxygen Saturation (Exercise) 95 % -- -- -- --   Oxygen Saturation (Exit) 96 % -- -- -- --   Rating of Perceived Exertion  (Exercise) '13 13 11 11 ' --   Perceived Dyspnea (Exercise) 0 -- -- -- --   Symptoms R knee pain from arthritis 10/10 R knee pain -- R knee pain --   Comments walk test results first full day of exercise -- -- --   Duration -- Progress to 30 minutes of  aerobic without signs/symptoms of physical distress Progress to 30 minutes of  aerobic without signs/symptoms of physical distress Continue with 30 min of aerobic exercise without signs/symptoms of physical distress. --   Intensity -- THRR unchanged THRR unchanged THRR unchanged --     Progression   Progression -- Continue to progress workloads to maintain intensity without signs/symptoms of physical distress. Continue to progress workloads to maintain intensity without signs/symptoms of physical distress. Continue to progress workloads to maintain intensity without signs/symptoms of physical distress. 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  Start with 1 day   Initial Home Exercises Provided -- -- -- -- 01/08/21    Row Name 01/22/21 1100 02/03/21 1000 02/18/21 1500 03/02/21 1200       Response to Exercise   Blood Pressure (Admit) 122/66 110/60 108/64 130/72    Blood Pressure (Exercise) 122/72 126/68 126/72 --    Blood Pressure (Exit) 130/72 128/78 120/68 110/60    Heart Rate (Admit) 86 bpm 60 bpm 86 bpm 91 bpm    Heart Rate (Exercise) 97 bpm 110 bpm 114 bpm 115 bpm    Heart Rate (Exit) 73 bpm 90 bpm 87  bpm 95 bpm    Rating of Perceived Exertion (Exercise) '12 12 12 12    ' Symptoms none knee pain -- knee pain    Duration Continue with 30 min of aerobic exercise without signs/symptoms of physical distress. Continue with 30 min of aerobic exercise without signs/symptoms of physical distress. Continue with 30 min of aerobic exercise without signs/symptoms of physical distress. Continue with 30 min of aerobic exercise without signs/symptoms of physical distress.    Intensity THRR unchanged THRR unchanged THRR unchanged THRR unchanged      Progression   Progression Continue to progress  workloads to maintain intensity without signs/symptoms of physical distress. Continue to progress workloads to maintain intensity without signs/symptoms of physical distress. Continue to progress workloads to maintain intensity without signs/symptoms of physical distress. Continue to progress workloads to maintain intensity without signs/symptoms of physical distress.    Average METs 2.66 3 3.85 3.95      Resistance Training   Training Prescription Yes Yes Yes Yes    Weight 4 lb 4 lb 4 lb 4 lb    Reps 10-15 10-15 10-15 10-15      Interval Training   Interval Training No No No No      NuStep   Level 4 3 -- 4    Minutes 30 30 -- 30    METs 2.9 3 -- 3.5      REL-XR   Level -- -- 1 1    Minutes -- -- 30 30    METs -- -- 3.85 4.5      Biostep-RELP   Level -- 2 -- --    Minutes -- 30 -- --    METs -- 3 -- --      Home Exercise Plan   Plans to continue exercise at [employer] (comment)  [place] (comment)  [place] (comment)  [place] (comment)  [place] videos    Frequency Add 2 additional days to program exercise sessions.  Start with 1 day Add 2 additional days to program exercise sessions.  Start with 1 day Add 2 additional days to program exercise sessions.  Start with 1 day Add 2  additional days to program exercise sessions.  Start with 1 day    Initial Home Exercises Provided 01/08/21 01/08/21 01/08/21 01/08/21             Exercise Comments:   Exercise Goals and Review:   Exercise Goals     Row Name 11/27/20 1636             Exercise Goals   Increase Physical Activity Yes       Intervention Provide advice, education, support and counseling about physical activity/exercise needs.;Develop an individualized exercise prescription for aerobic and resistive training based on initial evaluation findings, risk stratification, comorbidities and participant's personal goals.       Expected Outcomes Short Term: Attend rehab on a regular basis to increase amount of physical activity.;Long Term: Add in home exercise to make exercise part of routine and to increase amount of physical activity.;Long Term: Exercising regularly at least 3-5 days a week.       Increase Strength and Stamina Yes       Intervention Provide advice, education, support and counseling about physical activity/exercise needs.;Develop an individualized exercise prescription for aerobic and resistive training based on initial evaluation findings, risk stratification, comorbidities and participant's personal goals.       Expected Outcomes Short Term: Increase workloads from initial exercise prescription for resistance, speed, and METs.;Short Term: Perform resistance training exercises routinely during rehab and add in resistance training at home;Long Term: Improve cardiorespiratory fitness, muscular endurance and strength as measured by increased METs and functional capacity (6MWT)       Able to understand and use rate of perceived exertion (RPE) scale Yes       Intervention Provide education and explanation on how to use RPE scale       Expected Outcomes Short Term: Able to use RPE daily in rehab to  express subjective intensity level;Long Term:  Able to use RPE to guide intensity level when exercising  independently       Able to understand and use Dyspnea scale Yes       Intervention Provide education and explanation on how to use Dyspnea scale       Expected Outcomes Short Term: Able to use Dyspnea scale daily in rehab to express subjective sense of shortness of breath during exertion;Long Term: Able to use Dyspnea scale to guide intensity level when exercising independently       Knowledge and understanding of Target Heart Rate Range (THRR) Yes       Intervention Provide education and explanation of THRR including how the numbers were predicted and where they are located for reference       Expected Outcomes Short Term: Able to state/look up THRR;Short Term: Able to use daily as guideline for intensity in rehab;Long Term: Able to use THRR to govern intensity when exercising independently       Able to check pulse independently Yes       Intervention Provide education and demonstration on how to check pulse in carotid and radial arteries.;Review the importance of being able to check your own pulse for safety during independent exercise       Expected Outcomes Short Term: Able to explain why pulse checking is important during independent exercise;Long Term: Able to check pulse independently and accurately       Understanding of Exercise Prescription Yes       Intervention Provide education, explanation, and written materials on patient's individual exercise prescription       Expected Outcomes Short Term: Able to explain program exercise prescription;Long Term: Able to explain home exercise prescription to exercise independently                Exercise Goals Re-Evaluation :  Exercise Goals Re-Evaluation     Row Name 12/03/20 1546 12/10/20 1310 12/24/20 1552 01/06/21 1557 01/08/21 1623     Exercise Goal Re-Evaluation   Exercise Goals Review Increase Physical Activity;Able to understand and use rate of perceived exertion (RPE) scale;Knowledge and understanding of Target Heart Rate Range  (THRR);Understanding of Exercise Prescription;Increase Strength and Stamina;Able to check pulse independently Increase Physical Activity;Increase Strength and Stamina;Understanding of Exercise Prescription Increase Physical Activity;Increase Strength and Stamina Increase Physical Activity;Increase Strength and Stamina;Understanding of Exercise Prescription Increase Physical Activity;Increase Strength and Stamina;Understanding of Exercise Prescription   Comments Reviewed RPE and dyspnea scales, THR and program prescription with pt today.  Pt voiced understanding and was given a copy of goals to take home. Jocelyn Lamer has only completed her first full session of exercise.  She left early due to her knee pain this week.  She is hoping to get an injection for it to help manage pain better.  She is going to keep [place] posted on how things are going. Jocelyn Lamer has only attended 3 times this month.  COnsistent attendance is necessary for good progress.  Staff will monitor while in session. Morris continues to have inconsistent attendance.  She was here 5 times over the past month total.  She also continues to struggle with her knee.  We will continue to montior her progress and encourage improved attendance. Reviewed home exercise with pt today.  Pt plans to complete Youtube videos and is looking into the [facility]  for exercise.  Reviewed THR, pulse, RPE, sign and symptoms, pulse oximetery and when to call 911 or MD.  Also discussed weather considerations  and indoor options.  Pt voiced understanding.   Expected Outcomes Short: Use RPE daily to regulate intensity. Long: Follow program prescription in THR. Short: Get some knee pain relief and return to attendance Long: Follow program prescription Short: get back to consistent attendance Long:  build stamina Short: Regular attendance Long: Continue to improve stamina. Short: Add on 1 day of exercise Long: Exercise independently as home at appropriate exercise prescription    Row Name  01/22/21 1112 01/28/21 1539 02/03/21 1044 02/18/21 1556 02/23/21 1612     Exercise Goal Re-Evaluation   Exercise Goals Review Increase Physical Activity;Increase Strength and Stamina;Understanding of Exercise Prescription Increase Physical Activity;Increase Strength and Stamina;Understanding of Exercise Prescription Increase Physical Activity;Increase Strength and Stamina;Understanding of Exercise Prescription Increase Physical Activity;Increase Strength and Stamina Increase Physical Activity;Increase Strength and Stamina   Comments Layce still continues with inconsistent attendance. However, she has improved to level 4 on the Nustep and jumped up to 4 lbs for her handweights. She should show more improvement if she continuously comes. She should be encouraged to try different machines and walk if tolerated. Maribell has not been participating in aerobic exercise. She is very limited by her knee as she cannot walk very far without it hurting. She is looking into joining the [employer] and would like the staff to send her information over. She is able to tolerated seated exercises very well. She is hoping to buy a pulse ox to monitor her heart rate as well. She is trying to at least walk to her mailbox and box. Karima did buy 4 lb handweights and has been doing resistance training at home. Madysun is fair in rehab.  Her knee continues to limit her daily.  She will usually do 30 min on the seated equipment.  We will conitnue to monitor her progress. Edmonia is still at level 1 on XR.  Staff will encourage trying to increase levels.  More consistent attendance would yield better results. Geralyn has changed her schedule around to attend more consistently.   Expected Outcomes Short: Exercise on different machine as tolerated/ walk Long: Continue to build up overall strength and stamina Short: Dealer and participate and aerovic exercise Long: Exercise independently at home at appropriate prescription Short:  Increase workload on BioStep Long: Conitnue to improve stamina. Short:  increase workloads on seated machines Long:  build overall stamina Short: attend consistently Long: complete HT program    Row Name 03/02/21 1146             Exercise Goal Re-Evaluation   Exercise Goals Review Increase Physical Activity;Increase Strength and Stamina       Comments Noora has been sporadic with her attendance and is not increasing her workloads. Staff needs to discuss with patient to talk about improvement and moving up. She recently spoke with her doctor who is allowing patient to start walking on the treadmill again which would be beneficial for her. Will continue to monitor.       Expected Outcomes Short: Improve attendance and start walking Long: Increase overall MET level                Discharge Exercise Prescription (Final Exercise Prescription Changes):  Exercise Prescription Changes - 03/02/21 1200       Response to Exercise   Blood Pressure (Admit) 130/72    Blood Pressure (Exit) 110/60    Heart Rate (Admit) 91 bpm    Heart Rate (Exercise) 115 bpm    Heart Rate (Exit) 95 bpm  Rating of Perceived Exertion (Exercise) 12    Symptoms knee pain    Duration Continue with 30 min of aerobic exercise without signs/symptoms of physical distress.    Intensity THRR unchanged      Progression   Progression Continue to progress workloads to maintain intensity without signs/symptoms of physical distress.    Average METs 3.95      Resistance Training   Training Prescription Yes    Weight 4 lb    Reps 10-15      Interval Training   Interval Training No      NuStep   Level 4    Minutes 30    METs 3.5      REL-XR   Level 1    Minutes 30    METs 4.5      Home Exercise Plan   Plans to continue exercise at [employer] (comment)   [place] videos   Frequency Add 2 additional days to program exercise sessions.   Start with 1 day   Initial Home Exercises Provided  01/08/21             Nutrition:  Target Goals: Understanding of nutrition guidelines, daily intake of sodium <1538m, cholesterol <2066m calories 30% from fat and 7% or less from saturated fats, daily to have 5 or more servings of fruits and vegetables.  Education: All About Nutrition: -Group instruction provided by verbal, written material, interactive activities, discussions, models, and posters to present general guidelines for heart healthy nutrition including fat, fiber, MyPlate, the role of sodium in heart healthy nutrition, utilization of the nutrition label, and utilization of this knowledge for meal planning. Follow up email sent as well. Written material given at graduation. Flow[facility]  Date 12/08/15  Educator CoJaclyn ShaggyInstruction Review Code (retired) 2- meets goals/outcomes       Biometrics:  Pre Biometrics - 11/27/20 1629       Pre Biometrics   Height '5\' 6"'  (1.676 m)    Weight 246 lb 3.2 oz (111.7 kg)    BMI (Calculated) 39.76    Single Leg Stand 9.6 seconds              Nutrition Therapy Plan and Nutrition Goals:  Nutrition Therapy & Goals - 12/22/20 1528       Nutrition Therapy   Diet Low Na, HH, DM diet    Drug/Food Interactions Statins/Certain Fruits    Protein (specify units) 90g    Fiber 25 grams    Whole Grain Foods 3 servings    Saturated Fats 12 max. grams    Fruits and Vegetables 8 servings/day    Sodium 1.5 grams      Personal Nutrition Goals   Nutrition Goal ST: split white rice with brown rice LT: increase whole grains    Comments 6am will wake up, gets her gradchildren around 7am. B: bowl of oatmeal with 1/2 cup OJ and apple or banana L: the other day had a fruit salad with regular gingerale (hates diet soda), lean cuisine - macoroni wilth salsbury steak S: 1/2 cup popcorn D: porkchops, greens (largest portion), and rice (white rice). S: apple and peanut butter. Drinks:  water with flavoring. greens, cabbage, green beans - doesn't like other vegetables. She will not eat any other vegetables. Husband uses pam cooking spray. Uses Mrs. Dash. Her Bg will run around 102 or 106. A1C: around 6. She doesn't want to change the  way she eats, but is willing to try mixing brown rice with white rice. Discussed heart healthy, diabetes friendly eatinf.      Intervention Plan   Intervention Prescribe, educate and counsel regarding individualized specific dietary modifications aiming towards targeted core components such as weight, hypertension, lipid management, diabetes, heart failure and other comorbidities.;Nutrition handout(s) given to patient.    Expected Outcomes Short Term Goal: Understand basic principles of dietary content, such as calories, fat, sodium, cholesterol and nutrients.;Long Term Goal: Adherence to prescribed nutrition plan.;Short Term Goal: A plan has been developed with personal nutrition goals set during dietitian appointment.             Nutrition Assessments:  MEDIFICTS Score Key: ?70 Need to make dietary changes  40-70 Heart Healthy Diet ? 40 Therapeutic Level Cholesterol Diet  Flowsheet Row Cardiac Rehab from 12/22/2020 in [facility] Cardiac and Pulmonary Rehab  Picture Your Plate Total Score on Admission 50      Picture Your Plate Scores: <36 Unhealthy dietary pattern with much room for improvement. 41-50 Dietary pattern unlikely to meet recommendations for good health and room for improvement. 51-60 More healthful dietary pattern, with some room for improvement.  >60 Healthy dietary pattern, although there may be some specific behaviors that could be improved.    Nutrition Goals Re-Evaluation:  Nutrition Goals Re-Evaluation     [place] Name 01/28/21 1543 02/23/21 1609           Goals   Nutrition Goal ST: split white rice with brown rice LT: increase whole grains --      Comment Lilliana tried brown rice but did not like it. She doesn't think  she can use it in her diet. She does try eat a mix of a white/wheat bread. She does not eat whole wheat breads as she also does not like the taste of that. She has tried to incoporate it with other flavors to help. She is looking to lose weight and has eliminated her sweet snacks. She opted to get light frozen yogurt to replace her normal ice cream. Lawson has goven up sweets and eats oranges, apples and bananas.  She likes white wheat bread and gets that instead of wheat.      Expected Outcome Short: Continue to increase whole grains in diet Long: Continue to maintain heart healthy diet Short: continue to try new whole grain foods Long: maintain heart healthy diet               Nutrition Goals Discharge (Final Nutrition Goals Re-Evaluation):  Nutrition Goals Re-Evaluation - 02/23/21 1609       Goals   Comment Analiya has goven up sweets and eats oranges, apples and bananas.  She likes white wheat bread and gets that instead of wheat.    Expected Outcome Short: continue to try new whole grain foods Long: maintain heart healthy diet             Psychosocial: Target Goals: Acknowledge presence or absence of significant depression and/or stress, maximize coping skills, provide positive support system. Participant is able to verbalize types and ability to use techniques and skills needed for reducing stress and depression.   Education: Stress, Anxiety, and Depression - Group verbal and visual presentation to define topics covered.  Reviews how body is impacted by stress, anxiety, and depression.  Also discusses healthy ways to reduce stress and to treat/manage anxiety and depression.  Written material given at graduation. Flowsheet Row Cardiac Rehab from 02/09/2016 in [facility] Cardiac and  Pulmonary Rehab  Date 12/03/15  Educator Kathreen Cornfield, [facility]  Instruction Review Code (retired) 2- meets goals/outcomes       Education: Sleep Hygiene -Provides group verbal and written instruction about how  sleep can affect your health.  Define sleep hygiene, discuss sleep cycles and impact of sleep habits. Review good sleep hygiene tips.    Initial Review & Psychosocial Screening:  Initial Psych Review & Screening - 11/21/20 1430       Initial Review   Current issues with History of Depression;Current Stress Concerns      Family Dynamics   Good Support System? Yes      Barriers   Psychosocial barriers to participate in program The patient should benefit from training in stress management and relaxation.;There are no identifiable barriers or psychosocial needs.      Screening Interventions   Interventions Encouraged to exercise;Provide feedback about the scores to participant;To provide support and resources with identified psychosocial needs    Expected Outcomes Short Term goal: Utilizing psychosocial counselor, staff and physician to assist with identification of specific Stressors or current issues interfering with healing process. Setting desired goal for each stressor or current issue identified.;Long Term Goal: Stressors or current issues are controlled or eliminated.;Short Term goal: Identification and review with participant of any Quality of Life or Depression concerns found by scoring the questionnaire.;Long Term goal: The participant improves quality of Life and PHQ9 Scores as seen by post scores and/or verbalization of changes             Quality of Life Scores:   Quality of Life - 11/27/20 1650       Quality of Life Scores   Health/Function Pre 22.65 %    Socioeconomic Pre 24.75 %    Psych/Spiritual Pre 28.29 %    Family Pre 28.8 %    GLOBAL Pre 25.36 %            Scores of 19 and below usually indicate a poorer quality of life in these areas.  A difference of  2-3 points is a clinically meaningful difference.  A difference of 2-3 points in the total score of the Quality of Life Index has been associated with significant improvement in overall quality of life,  self-image, physical symptoms, and general health in studies assessing change in quality of life.  PHQ-9: Recent Review Flowsheet Data     Depression screen [facility] 2/9 03/12/2021 02/19/2021 11/27/2020 11/17/2020 08/20/2020   Decreased Interest 0 0 0 0 0   Down, Depressed, Hopeless 0 0 0 0 0   PHQ - 2 Score 0 0 0 0 0   Altered sleeping - - 0 - -   Tired, decreased energy - - 0 - -   Change in appetite - - 0 - -   Feeling bad or failure about yourself  - - 0 - -   Trouble concentrating - - 0 - -   Moving slowly or fidgety/restless - - 0 - -   Suicidal thoughts - - 0 - -   PHQ-9 Score - - 0 - -   Difficult doing work/chores - - Not difficult at all - -      Interpretation of Total Score  Total Score Depression Severity:  1-4 = Minimal depression, 5-9 = Mild depression, 10-14 = Moderate depression, 15-19 = Moderately severe depression, 20-27 = Severe depression   Psychosocial Evaluation and Intervention:  Psychosocial Evaluation - 11/21/20 1438       Psychosocial Evaluation &  Interventions   Comments Allyn is returning to Cardiac Rehab with another stent. She did some of the program in 2020. She states she is doing well and "doesn't let things stress me out." She reports to have a good support system, sleeping well, and is enjoying her life day to day. She is looking forward to coming to Cardiac Rehab to start back exercising and lose weight and hopes to get into a good routine.    Expected Outcomes Short: attend cardiac rehab for education and exercise. Long: develop and maintain positive self care habits.    Continue Psychosocial Services  Follow up required by staff             Psychosocial Re-Evaluation:  Psychosocial Re-Evaluation     [place] Name 01/01/21 1608 01/28/21 1550 02/23/21 1611         Psychosocial Re-Evaluation   Current issues with None Identified None Identified None Identified     Comments Patient reports no issues with their current mental states, sleep, stress,  depression or anxiety. Will follow up with patient in a few weeks for any changes. Francina denies any concerns with depression, anxiety, or big stressors. She has good support from husband and kids. Feels exercise has helped her feel stronger even though she's limited by walking, Lorren says she doesnt let things stress her out.     Expected Outcomes Short: Continue to exercise regularly to support mental health and notify staff of any changes. Long: maintain mental health and well being through teaching of rehab or prescribed medications independently. Short: Continue attending rehab consistently Long: Continue to exercise for stress management and maintain posititve attitude ST/LT: maintain positive outlook and low stress     Interventions Encouraged to attend Cardiac Rehabilitation for the exercise Encouraged to attend Cardiac Rehabilitation for the exercise --     Continue Psychosocial Services  Follow up required by staff Follow up required by staff --              Psychosocial Discharge (Final Psychosocial Re-Evaluation):  Psychosocial Re-Evaluation - 02/23/21 1611       Psychosocial Re-Evaluation   Current issues with None Identified    Comments Kelee says she doesnt let things stress her out.    Expected Outcomes ST/LT: maintain positive outlook and low stress             Vocational Rehabilitation: Provide vocational rehab assistance to qualifying candidates.   Vocational Rehab Evaluation & Intervention:  Vocational Rehab - 11/21/20 1430       Initial Vocational Rehab Evaluation & Intervention   Assessment shows need for Vocational Rehabilitation No             Education: Education Goals: Education classes will be provided on a variety of topics geared toward better understanding of heart health and risk factor modification. Participant will state understanding/return demonstration of topics presented as noted by education test scores.  Learning  Barriers/Preferences:  Learning Barriers/Preferences - 11/21/20 1430       Learning Barriers/Preferences   Learning Barriers Sight   glasses   Learning Preferences Individual Instruction;Verbal Instruction             General Cardiac Education Topics:  AED/CPR: - Group verbal and written instruction with the use of models to demonstrate the basic use of the AED with the basic ABC's of resuscitation.   Anatomy and Cardiac Procedures: - Group verbal and visual presentation and models provide information about basic cardiac anatomy and function. Reviews the  testing methods done to diagnose heart disease and the outcomes of the test results. Describes the treatment choices: Medical Management, Angioplasty, or Coronary Bypass Surgery for treating various heart conditions including Myocardial Infarction, Angina, Valve Disease, and Cardiac Arrhythmias.  Written material given at graduation.   Medication Safety: - Group verbal and visual instruction to review commonly prescribed medications for heart and lung disease. Reviews the medication, class of the drug, and side effects. Includes the steps to properly store meds and maintain the prescription regimen.  Written material given at graduation.   Intimacy: - Group verbal instruction through game format to discuss how heart and lung disease can affect sexual intimacy. Written material given at graduation..   Know Your Numbers and Heart Failure: - Group verbal and visual instruction to discuss disease risk factors for cardiac and pulmonary disease and treatment options.  Reviews associated critical values for Overweight/Obesity, Hypertension, Cholesterol, and Diabetes.  Discusses basics of heart failure: signs/symptoms and treatments.  Introduces Heart Failure Zone chart for action plan for heart failure.  Written material given at graduation.   Infection Prevention: - Provides verbal and written material to individual with discussion of  infection control including proper hand washing and proper equipment cleaning during exercise session. Flowsheet Row Cardiac Rehab from 11/27/2020 in [facility] Cardiac and Pulmonary Rehab  Date 11/27/20  Educator [place]  Instruction Review Code 1- Verbalizes Understanding       Falls Prevention: - Provides verbal and written material to individual with discussion of falls prevention and safety. Flowsheet Row Cardiac Rehab from 11/27/2020 in [facility] Cardiac and Pulmonary Rehab  Date 11/27/20  Educator [place]  Instruction Review Code 1- Verbalizes Understanding       Other: -Provides group and verbal instruction on various topics (see comments)   Knowledge Questionnaire Score:  Knowledge Questionnaire Score - 11/27/20 1639       Knowledge Questionnaire Score   Pre Score 18/26: Heart attack, HF, Nutrition, Stress, Exercise, Nutrition             Core Components/Risk Factors/Patient Goals at Admission:  Personal Goals and Risk Factors at Admission - 11/27/20 1637       Core Components/Risk Factors/Patient Goals on Admission    Weight Management Yes;Obesity;Weight Loss    Intervention Weight Management: Develop a combined nutrition and exercise program designed to reach desired caloric intake, while maintaining appropriate intake of nutrient and fiber, sodium and fats, and appropriate energy expenditure required for the weight goal.;Weight Management: Provide education and appropriate resources to help participant work on and attain dietary goals.;Weight Management/Obesity: Establish reasonable short term and long term weight goals.;Obesity: Provide education and appropriate resources to help participant work on and attain dietary goals.    Admit Weight 246 lb (111.6 kg)    Goal Weight: Short Term 240 lb (108.9 kg)    Goal Weight: Long Term 200 lb (90.7 kg)    Expected Outcomes Short Term: Continue to assess and modify interventions until short term weight is achieved;Long Term: Adherence to  nutrition and physical activity/exercise program aimed toward attainment of established weight goal;Weight Loss: Understanding of general recommendations for a balanced deficit meal plan, which promotes 1-2 lb weight loss per week and includes a negative energy balance of [phone] kcal/d;Understanding recommendations for meals to include 15-35% energy as protein, 25-35% energy from fat, 35-60% energy from carbohydrates, less than 218m of dietary cholesterol, 20-35 gm of total fiber daily;Understanding of distribution of calorie intake throughout the day with the consumption of 4-5 meals/snacks  Diabetes Yes    Intervention Provide education about signs/symptoms and action to take for hypo/hyperglycemia.;Provide education about proper nutrition, including hydration, and aerobic/resistive exercise prescription along with prescribed medications to achieve blood glucose in normal ranges: Fasting glucose 65-99 mg/dL    Expected Outcomes Short Term: Participant verbalizes understanding of the signs/symptoms and immediate care of hyper/hypoglycemia, proper foot care and importance of medication, aerobic/resistive exercise and nutrition plan for blood glucose control.;Long Term: Attainment of HbA1C < 7%.    Hypertension Yes    Intervention Provide education on lifestyle modifcations including regular physical activity/exercise, weight management, moderate sodium restriction and increased consumption of fresh fruit, vegetables, and low fat dairy, alcohol moderation, and smoking cessation.;Monitor prescription use compliance.    Expected Outcomes Short Term: Continued assessment and intervention until BP is < 140/38m HG in hypertensive participants. < 130/857mHG in hypertensive participants with diabetes, heart failure or chronic kidney disease.;Long Term: Maintenance of blood pressure at goal levels.    Lipids Yes    Intervention Provide education and support for participant on nutrition & aerobic/resistive  exercise along with prescribed medications to achieve LDL <7062mHDL >8m47m  Expected Outcomes Short Term: Participant states understanding of desired cholesterol values and is compliant with medications prescribed. Participant is following exercise prescription and nutrition guidelines.;Long Term: Cholesterol controlled with medications as prescribed, with individualized exercise RX and with personalized nutrition plan. Value goals: LDL < 70mg27mL > 40 mg.             Education:Diabetes - Individual verbal and written instruction to review signs/symptoms of diabetes, desired ranges of glucose level fasting, after meals and with exercise. Acknowledge that pre and post exercise glucose checks will be done for 3 sessions at entry of program. [facility]  Education need identified 11/27/20  Date 11/27/20  Educator [place]  IMelbournetruction Review Code 1- Verbalizes Understanding       Core Components/Risk Factors/Patient Goals Review:   Goals and Risk Factor Review     Row Name 01/01/21 1605 01/28/21 1545 02/23/21 1607         Core Components/Risk Factors/Patient Goals Review   Personal Goals Review Weight Management/Obesity;Hypertension;Diabetes Weight Management/Obesity;Hypertension;Diabetes Weight Management/Obesity;Hypertension;Diabetes     Review [place] has been checking her blood pressure daily, once in the morning and once at night. She checks her sugar in the morning before she eats. Her blood sugar has been around 90-100 before she eats. She wants to work on weight loss. Arya continues to check her sugars every morning, states its around 118 as a fasting glucose before breakfast. Her BP cuff broke and she is planning on getting a new one so she has not checked her BP. She wants to lose weight- she has been giving up her sweets, especially ice cream. She is opting to get frozen yogurt- light version instead. Her goal  weight is 180 lb and is hoping her exericse will help too. Vivkie says BG has been between 100-101.  A1C was 7 at  check last week.  She can tell her clothes fit loose.     Expected Outcomes Short: lose 5 pounds in the next two weeks. Long: reach a weight goal of 200. Short: Continue making modifcations to diet to promote weight loss Long: Continue to manage lifestyle risk factors Short:  continue current diet and exercise Long: manage risk factors long term  Core Components/Risk Factors/Patient Goals at Discharge (Final Review):   Goals and Risk Factor Review - 02/23/21 1607       Core Components/Risk Factors/Patient Goals Review   Personal Goals Review Weight Management/Obesity;Hypertension;Diabetes    Review [place] says BG has been between 100-101.  A1C was 7 at  check last week.  She can tell her clothes fit loose.    Expected Outcomes Short:  continue current diet and exercise Long: manage risk factors long term             ITP Comments:  ITP Comments     Row Name 11/21/20 1432 11/27/20 1704 12/03/20 1546 12/17/20 0930 12/18/20 1649   ITP Comments Initial telephone orienation completed. Diagnosis can be found in [facility] 5/3. EP orientation scheduled for Thursday 5/26 at 3pm. Completed 6MWT and gym orientation. Initial ITP created and sent for review to Dr. Emily Filbert, Medical Director. First full day of exercise!  Patient was oriented to gym and equipment including functions, settings, policies, and procedures.  Patient's individual exercise prescription and treatment plan were reviewed.  All starting workloads were established based on the results of the 6 minute walk test done at initial orientation visit.  The plan for exercise progression was also introduced and progression will be customized based on patient's performance and goals. 30 Day review completed. Medical Director ITP review done, changes made as directed, and signed approval by Medical Director.  2 visits in  June [place] reports she will be getting her left knee replaced next month. Before then we will need to discharge her and when she is ready to return she can get a new referral to come back to rehab. Until then she will continue to come to rehab. She will know by the end of June what the date is.    [place] Name 01/14/21 1208 02/11/21 0834 03/02/21 1727 03/11/21 0807 03/11/21 1227   ITP Comments 30 Day review completed. Medical Director ITP review done, changes made as directed, and signed approval by Medical Director. 30 Day review completed. Medical Director ITP review done, changes made as directed, and signed approval by Medical Director. Patient's attendance has been sporadic. Staff spoke with patient today and reiterated the attendance policy, stating she only has 18 weeks to complete the program and may not benefit from it if her attendance is poor. Patient was reminded to call if she can't come. Patient understood and agreed and states she will try to improve her coming. 30 Day review completed. Medical Director ITP review done, changes made as directed, and signed approval by Medical Director. Ebonique informed staff that she has been in the ED with side pain last week. She was discharged without a clear answer as to what is causing her the discomfort. She was going to the walk in clinic today for a painful spot on her leg so she will miss class today. Tomorrow she has two follow up appointments with her primary and cardiologist. She hopes to return to class next week.    [place] Name 03/12/21 1420 03/30/21 1041 04/08/21 1440 04/15/21 1336     ITP Comments Patient called and stated she will be out of rehab next week; per patient she saw her doctor who states she may have pneumonia or fluid in lungs. Patient will keep [place] updated and will receive clearance to come back when she feels ready to do so. Currently waiting on CT results. Jyoti has not attended [facility] since the end of August. Spoke with patient  who  recently saw her cardiologist on Friday who is referring her to a pulmonologist. Patient will call us after she completed her consultation and re-evaluate. Patient will need clearance to come back and/or discuss possible discharge pending results. 30 day review completed. ITP sent to Dr. Emily Filbert, Medical Director of Cardiac Rehab. Continue with ITP unless changes are made by physician.    Patient continues to be out on hold for medical reasons. Last attended 03/03/21. Raylin is supposed to speak with pulmonologist today.  She will call us to let us know if she wants to discharge today or tomorrow.             Comments: Discharge ITP

## 2021-04-26 NOTE — Telephone Encounter (Signed)
error 

## 2021-04-27 ENCOUNTER — Ambulatory Visit: Payer: Medicare Other

## 2021-04-29 ENCOUNTER — Ambulatory Visit: Payer: Medicare Other

## 2021-04-30 ENCOUNTER — Ambulatory Visit: Payer: Medicare Other

## 2021-05-04 ENCOUNTER — Ambulatory Visit: Payer: Medicare Other

## 2021-05-25 ENCOUNTER — Ambulatory Visit: Payer: Medicare Other | Admitting: Physician Assistant

## 2021-05-25 ENCOUNTER — Telehealth: Payer: Self-pay

## 2021-05-25 DIAGNOSIS — Z0289 Encounter for other administrative examinations: Secondary | ICD-10-CM

## 2021-05-25 NOTE — Telephone Encounter (Signed)
Patient has had multiple no show and missed appointments. Upon reschedule patient needs to pay $75 for the last 3 no show fees and verbally advised that if a future appointment is cancelled or no showed that patient will be discharged from the practice.

## 2021-06-09 ENCOUNTER — Other Ambulatory Visit: Payer: Self-pay

## 2021-06-09 ENCOUNTER — Encounter: Payer: Self-pay | Admitting: Emergency Medicine

## 2021-06-09 ENCOUNTER — Emergency Department: Payer: Medicare Other

## 2021-06-09 ENCOUNTER — Observation Stay
Admission: EM | Admit: 2021-06-09 | Discharge: 2021-06-10 | Disposition: A | Discharge status: Home / Self Care | Payer: Medicare Other | Attending: Hospitalist | Admitting: Hospitalist

## 2021-06-09 DIAGNOSIS — E785 Hyperlipidemia, unspecified: Secondary | ICD-10-CM | POA: Diagnosis present

## 2021-06-09 DIAGNOSIS — E119 Type 2 diabetes mellitus without complications: Secondary | ICD-10-CM | POA: Diagnosis not present

## 2021-06-09 DIAGNOSIS — I5032 Chronic diastolic (congestive) heart failure: Secondary | ICD-10-CM | POA: Diagnosis not present

## 2021-06-09 DIAGNOSIS — I2510 Atherosclerotic heart disease of native coronary artery without angina pectoris: Secondary | ICD-10-CM | POA: Diagnosis not present

## 2021-06-09 DIAGNOSIS — Z20822 Contact with and (suspected) exposure to covid-19: Secondary | ICD-10-CM | POA: Insufficient documentation

## 2021-06-09 DIAGNOSIS — I110 Hypertensive heart disease with heart failure: Secondary | ICD-10-CM | POA: Insufficient documentation

## 2021-06-09 DIAGNOSIS — J45909 Unspecified asthma, uncomplicated: Secondary | ICD-10-CM | POA: Diagnosis present

## 2021-06-09 DIAGNOSIS — Z79899 Other long term (current) drug therapy: Secondary | ICD-10-CM | POA: Insufficient documentation

## 2021-06-09 DIAGNOSIS — R0789 Other chest pain: Principal | ICD-10-CM | POA: Insufficient documentation

## 2021-06-09 DIAGNOSIS — Z951 Presence of aortocoronary bypass graft: Secondary | ICD-10-CM | POA: Diagnosis not present

## 2021-06-09 DIAGNOSIS — I10 Essential (primary) hypertension: Secondary | ICD-10-CM | POA: Diagnosis not present

## 2021-06-09 DIAGNOSIS — J4520 Mild intermittent asthma, uncomplicated: Secondary | ICD-10-CM | POA: Diagnosis not present

## 2021-06-09 DIAGNOSIS — K219 Gastro-esophageal reflux disease without esophagitis: Secondary | ICD-10-CM

## 2021-06-09 DIAGNOSIS — Z7982 Long term (current) use of aspirin: Secondary | ICD-10-CM | POA: Diagnosis not present

## 2021-06-09 DIAGNOSIS — R079 Chest pain, unspecified: Secondary | ICD-10-CM | POA: Diagnosis present

## 2021-06-09 DIAGNOSIS — I5033 Acute on chronic diastolic (congestive) heart failure: Secondary | ICD-10-CM | POA: Insufficient documentation

## 2021-06-09 DIAGNOSIS — F32A Depression, unspecified: Secondary | ICD-10-CM | POA: Insufficient documentation

## 2021-06-09 DIAGNOSIS — E1159 Type 2 diabetes mellitus with other circulatory complications: Secondary | ICD-10-CM | POA: Diagnosis present

## 2021-06-09 LAB — BASIC METABOLIC PANEL
Anion gap: 8 (ref 5–15)
BUN: 10 mg/dL (ref 8–23)
CO2: 27 mmol/L (ref 22–32)
Calcium: 9.6 mg/dL (ref 8.9–10.3)
Chloride: 103 mmol/L (ref 98–111)
Creatinine, Ser: 0.54 mg/dL (ref 0.44–1.00)
GFR, Estimated: >60 mL/min (ref >60)
Glucose, Bld: 106 mg/dL — ABNORMAL HIGH (ref 70–99)
Potassium: 4 mmol/L (ref 3.5–5.1)
Sodium: 138 mmol/L (ref 135–145)

## 2021-06-09 LAB — CBC
HCT: 37.5 % (ref 36.0–46.0)
Hemoglobin: 11.6 g/dL — ABNORMAL LOW (ref 12.0–15.0)
MCH: 23.3 pg — ABNORMAL LOW (ref 26.0–34.0)
MCHC: 30.9 g/dL (ref 30.0–36.0)
MCV: 75.3 fL — ABNORMAL LOW (ref 80.0–100.0)
Platelets: 372 10*3/uL (ref 150–400)
RBC: 4.98 MIL/uL (ref 3.87–5.11)
RDW: 19.2 % — ABNORMAL HIGH (ref 11.5–15.5)
WBC: 8.7 10*3/uL (ref 4.0–10.5)
nRBC: 0 % (ref 0.0–0.2)

## 2021-06-09 LAB — TROPONIN I (HIGH SENSITIVITY)
Troponin I (High Sensitivity): 4 ng/L (ref <18)
Troponin I (High Sensitivity): 6 ng/L (ref <18)

## 2021-06-09 LAB — RESP PANEL BY RT-PCR (FLU A&B, COVID) ARPGX2
Influenza A by PCR: NEGATIVE
Influenza B by PCR: NEGATIVE
SARS Coronavirus 2 by RT PCR: NEGATIVE

## 2021-06-09 LAB — CBG MONITORING, ED
Glucose-Capillary: 139 mg/dL — ABNORMAL HIGH (ref 70–99)
Glucose-Capillary: 156 mg/dL — ABNORMAL HIGH (ref 70–99)

## 2021-06-09 LAB — PROTIME-INR
INR: 1.1 (ref 0.8–1.2)
Prothrombin Time: 14.6 seconds (ref 11.4–15.2)

## 2021-06-09 LAB — BRAIN NATRIURETIC PEPTIDE: B Natriuretic Peptide: 48.2 pg/mL (ref 0.0–100.0)

## 2021-06-09 MED ORDER — ALBUTEROL SULFATE (2.5 MG/3ML) 0.083% IN NEBU: 3.0000 mL | INHALATION_SOLUTION | RESPIRATORY_TRACT | Status: DC | PRN

## 2021-06-09 MED ORDER — MORPHINE SULFATE (PF) 2 MG/ML IV SOLN: 2.0000 mg | INTRAVENOUS | Status: DC | PRN

## 2021-06-09 MED ORDER — DM-GUAIFENESIN ER 30-600 MG PO TB12: 1.0000 | ORAL_TABLET | Freq: Two times a day (BID) | ORAL | Status: DC | PRN

## 2021-06-09 MED ORDER — AMLODIPINE BESYLATE 5 MG PO TABS
10.0000 mg | ORAL_TABLET | Freq: Every day | ORAL | Status: DC
  Administered 2021-06-10: 10 mg via ORAL
  Filled 2021-06-09: qty 2

## 2021-06-09 MED ORDER — CYCLOBENZAPRINE HCL 10 MG PO TABS
10.0000 mg | ORAL_TABLET | Freq: Every day | ORAL | Status: DC
  Administered 2021-06-09: 10 mg via ORAL
  Filled 2021-06-09: qty 1

## 2021-06-09 MED ORDER — FAMOTIDINE 20 MG PO TABS
20.0000 mg | ORAL_TABLET | Freq: Every day | ORAL | Status: DC
  Administered 2021-06-09 – 2021-06-10 (×2): 20 mg via ORAL
  Filled 2021-06-09 (×2): qty 1

## 2021-06-09 MED ORDER — ASPIRIN EC 81 MG PO TBEC
81.0000 mg | DELAYED_RELEASE_TABLET | Freq: Every day | ORAL | Status: DC
  Administered 2021-06-10: 81 mg via ORAL
  Filled 2021-06-09: qty 1

## 2021-06-09 MED ORDER — LABETALOL HCL 5 MG/ML IV SOLN
10.0000 mg | Freq: Once | INTRAVENOUS | Status: AC
  Administered 2021-06-09: 10 mg via INTRAVENOUS

## 2021-06-09 MED ORDER — FUROSEMIDE 40 MG PO TABS
40.0000 mg | ORAL_TABLET | Freq: Two times a day (BID) | ORAL | Status: DC
  Administered 2021-06-09 – 2021-06-10 (×2): 40 mg via ORAL
  Filled 2021-06-09 (×2): qty 1

## 2021-06-09 MED ORDER — ISOSORBIDE MONONITRATE ER 60 MG PO TB24
60.0000 mg | ORAL_TABLET | Freq: Every day | ORAL | Status: DC
  Administered 2021-06-09 – 2021-06-10 (×2): 60 mg via ORAL
  Filled 2021-06-09 (×2): qty 1

## 2021-06-09 MED ORDER — ONDANSETRON HCL 4 MG/2ML IJ SOLN: 4.0000 mg | Freq: Three times a day (TID) | INTRAMUSCULAR | Status: DC | PRN

## 2021-06-09 MED ORDER — ZOLPIDEM TARTRATE 5 MG PO TABS: 5.0000 mg | ORAL_TABLET | Freq: Every evening | ORAL | Status: DC | PRN

## 2021-06-09 MED ORDER — MONTELUKAST SODIUM 10 MG PO TABS
10.0000 mg | ORAL_TABLET | Freq: Every day | ORAL | Status: DC
  Administered 2021-06-09: 10 mg via ORAL
  Filled 2021-06-09: qty 1

## 2021-06-09 MED ORDER — ATORVASTATIN CALCIUM 20 MG PO TABS
80.0000 mg | ORAL_TABLET | Freq: Every day | ORAL | Status: DC
  Administered 2021-06-09 – 2021-06-10 (×2): 80 mg via ORAL
  Filled 2021-06-09 (×2): qty 4

## 2021-06-09 MED ORDER — OXYBUTYNIN CHLORIDE ER 5 MG PO TB24
5.0000 mg | ORAL_TABLET | Freq: Every day | ORAL | Status: DC
  Administered 2021-06-09: 5 mg via ORAL
  Filled 2021-06-09 (×2): qty 1

## 2021-06-09 MED ORDER — ENOXAPARIN SODIUM 60 MG/0.6ML IJ SOSY
0.5000 mg/kg | PREFILLED_SYRINGE | INTRAMUSCULAR | Status: DC
  Administered 2021-06-09: 50 mg via SUBCUTANEOUS
  Filled 2021-06-09: qty 0.6

## 2021-06-09 MED ORDER — ASPIRIN 81 MG PO CHEW
324.0000 mg | CHEWABLE_TABLET | Freq: Once | ORAL | Status: AC
  Administered 2021-06-09: 324 mg via ORAL
  Filled 2021-06-09: qty 4

## 2021-06-09 MED ORDER — NITROGLYCERIN 0.4 MG SL SUBL: 0.4000 mg | SUBLINGUAL_TABLET | SUBLINGUAL | Status: DC | PRN

## 2021-06-09 MED ORDER — CLOPIDOGREL BISULFATE 75 MG PO TABS
75.0000 mg | ORAL_TABLET | Freq: Every day | ORAL | Status: DC
  Administered 2021-06-09 – 2021-06-10 (×2): 75 mg via ORAL
  Filled 2021-06-09 (×2): qty 1

## 2021-06-09 MED ORDER — LABETALOL HCL 5 MG/ML IV SOLN
20.0000 mg | Freq: Once | INTRAVENOUS | Status: DC
  Filled 2021-06-09: qty 4

## 2021-06-09 MED ORDER — LOSARTAN POTASSIUM 50 MG PO TABS
100.0000 mg | ORAL_TABLET | Freq: Every day | ORAL | Status: DC
  Administered 2021-06-09 – 2021-06-10 (×2): 100 mg via ORAL
  Filled 2021-06-09 (×2): qty 2

## 2021-06-09 MED ORDER — INSULIN ASPART 100 UNIT/ML IJ SOLN
0.0000 [IU] | Freq: Three times a day (TID) | INTRAMUSCULAR | Status: DC
  Administered 2021-06-09: 1 [IU] via SUBCUTANEOUS
  Administered 2021-06-10: 2 [IU] via SUBCUTANEOUS
  Filled 2021-06-09 (×2): qty 1

## 2021-06-09 MED ORDER — HYDRALAZINE HCL 20 MG/ML IJ SOLN: 5.0000 mg | INTRAMUSCULAR | Status: DC | PRN

## 2021-06-09 MED ORDER — ACETAMINOPHEN 325 MG PO TABS: 650.0000 mg | ORAL_TABLET | Freq: Four times a day (QID) | ORAL | Status: DC | PRN

## 2021-06-09 MED ORDER — INSULIN ASPART 100 UNIT/ML IJ SOLN: 0.0000 [IU] | Freq: Every day | INTRAMUSCULAR | Status: DC

## 2021-06-09 NOTE — ED Provider Notes (Signed)
[facility]  ____________________________________________   Event Date/Time   First MD Initiated Contact with Patient 06/09/21 1233     (approximate)  I have reviewed the triage vital signs and the nursing notes.   HISTORY  Chief Complaint Chest Pain    HPI Felicia Woods is a 65 y.o. female with past medical history of coronary artery disease, diabetes, hypertension, hyperlipidemia who presents with chest pain.  Patient tells me that she was walking to her car around 9:30 AM today when she developed burning pain in the center of her chest that did not radiate was associated with nausea and diaphoresis.  Pain continued and she took 1 nitroglycerin which did not help.  Since that time it has been coming and going, it is worse with exertion.  Patient tells me that she gets this pain almost daily when she is walking.  Normally goes away with nitroglycerin and resolves with rest.  She follows with [facility], last had a cath in May 2022 in which she had a PCI x2.  She denies vomiting abdominal pain lower extremity edema.  Other than lasting longer the pain is typical of her anginal pain.         Past Medical History:  Diagnosis Date   Asthma    Coronary artery disease    Diabetes mellitus without complication ([place])    Heart attack ([place])    Hyperlipidemia    Hypertension    MI (myocardial infarction) ([place])    Migraine headache with aura    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    Patient Active Problem List   Diagnosis Date Noted   Abnormal ECG 03/12/2021   Stable angina ([place]) 10/29/2020   Acute non-recurrent maxillary sinusitis 08/01/2020   Cough 08/01/2020   Pain in joint of left knee 07/17/2020   Uncontrolled type 2 diabetes mellitus with hyperglycemia ([place]) 03/25/2020   Neck pain 03/25/2020   Other insomnia 03/25/2020   BMI 39.0-39.9,adult 03/25/2020   Atherosclerosis of aorta ([place]) 03/19/2020   Pain in right knee 03/04/2020    Lumbar radiculopathy 03/04/2020   Unstable angina ([place]) 02/13/2019   Acute bilateral low back pain without sciatica 08/21/2018   Asthma 08/21/2018   Coronary artery disease involving coronary bypass graft of native heart without angina pectoris 08/21/2018   Depression, major, single episode, in partial remission ([place]) 08/21/2018   Chest pain 11/02/2017   S/P drug eluting coronary stent placement 02/16/2017   Numbness and tingling 12/01/2016   Chronic tension-type headache, intractable 12/01/2016   Osteoarthritis of knee 11/12/2016   Obesity, Class II, BMI 35-39.9 05/13/2016   Hx of CABG 2010 11/12/2015   Type 2 diabetes mellitus with circulatory disorder, without long-term current use of insulin ([place]) 11/12/2015   Morbid obesity ([place]) 11/12/2015   SOBOE (shortness of breath on exertion) 11/12/2015   RBBB 11/12/2015   ST elevation myocardial infarction (STEMI) of inferior wall ([place]) 11/03/2015   SVG-RCA throbectomy and DES 10/18/15    Hypertensive heart disease without heart failure    Vertigo    STEMI 10/18/15 10/18/2015   NSTEMI (non-ST elevated myocardial infarction) ([place]) 10/18/2015   Intractable chronic migraine without aura and without status migrainosus 05/08/2015   Essential hypertension 03/17/2015   Acute on chronic diastolic CHF (congestive heart failure), NYHA class 3 ([place]) 08/26/2014   CAD (coronary artery disease) 08/26/2014   Headache 08/06/2014   Mixed hyperlipidemia 07/30/2014    Past Surgical History:  Procedure Laterality Date  ABDOMINAL HYSTERECTOMY     CARDIAC CATHETERIZATION     CARDIAC CATHETERIZATION N/A 10/18/2015   Procedure: Left Heart Cath and Cors/Grafts Angiography;  Surgeon: Lorretta Harp, MD;  Location: [place] [facility];  Service: Cardiovascular;  Laterality: N/A;   CARDIAC CATHETERIZATION N/A 10/18/2015   Procedure: Coronary Stent Intervention;  Surgeon: Lorretta Harp, MD;  Location: [place] [facility];  Service: Cardiovascular;  Laterality:  N/A;   CARDIAC SURGERY     CHOLECYSTECTOMY     CORONARY ANGIOPLASTY     CORONARY ARTERY BYPASS GRAFT     4 vessels - 2010   CORONARY STENT INTERVENTION N/A 02/16/2017   Procedure: CORONARY STENT INTERVENTION;  Surgeon: Yolonda Kida, MD;  Location: [place] [facility];  Service: Cardiovascular;  Laterality: N/A;   CORONARY STENT INTERVENTION N/A 02/13/2019   Procedure: CORONARY STENT INTERVENTION;  Surgeon: Nelva Bush, MD;  Location: [place] [facility];  Service: Cardiovascular;  Laterality: N/A;  SVG to RCA   CORONARY STENT INTERVENTION Left 03/08/2019   Procedure: CORONARY STENT INTERVENTION;  Surgeon: Yolonda Kida, MD;  Location: [place] [facility];  Service: Cardiovascular;  Laterality: Left;   CORONARY STENT INTERVENTION N/A 11/04/2020   Procedure: CORONARY STENT INTERVENTION;  Surgeon: Nelva Bush, MD;  Location: [place] [facility];  Service: Cardiovascular;  Laterality: N/A;   LEFT HEART CATH AND CORONARY ANGIOGRAPHY Left 02/16/2017   Procedure: LEFT HEART CATH AND CORONARY ANGIOGRAPHY;  Surgeon: Corey Skains, MD;  Location: [place] [facility];  Service: Cardiovascular;  Laterality: Left;   LEFT HEART CATH AND CORS/GRAFTS ANGIOGRAPHY Left 11/23/2017   Procedure: LEFT HEART CATH AND CORS/GRAFTS ANGIOGRAPHY;  Surgeon: Corey Skains, MD;  Location: [place] [facility];  Service: Cardiovascular;  Laterality: Left;   LEFT HEART CATH AND CORS/GRAFTS ANGIOGRAPHY N/A 02/13/2019   Procedure: LEFT HEART CATH AND CORS/GRAFTS ANGIOGRAPHY;  Surgeon: Corey Skains, MD;  Location: [place] [facility];  Service: Cardiovascular;  Laterality: N/A;   LEFT HEART CATH AND CORS/GRAFTS ANGIOGRAPHY N/A 07/03/2019   Procedure: LEFT HEART CATH AND CORS/GRAFTS ANGIOGRAPHY;  Surgeon: Corey Skains, MD;  Location: [place] [facility];  Service: Cardiovascular;  Laterality: N/A;   LEFT HEART CATH AND CORS/GRAFTS ANGIOGRAPHY N/A 11/04/2020   Procedure: LEFT HEART CATH AND  CORS/GRAFTS ANGIOGRAPHY;  Surgeon: Corey Skains, MD;  Location: [place] [facility];  Service: Cardiovascular;  Laterality: N/A;    Prior to Admission medications   Medication Sig Start Date End Date Taking? Authorizing Provider  Accu-Chek Softclix Lancets lancets Use as instructed twice a day to check blood sugars  DIAG -E11.59 08/20/20   Luiz Ochoa, NP  albuterol (PROVENTIL HFA;VENTOLIN HFA) 108 (90 Base) MCG/ACT inhaler Inhale 2 puffs into the lungs every 6 (six) hours as needed for wheezing or shortness of breath.    [provider]  amLODipine (NORVASC) 10 MG tablet Take 1 tablet (10 mg total) by mouth daily. 08/20/20   Luiz Ochoa, NP  aspirin EC 81 MG tablet Take 81 mg by mouth daily.    [provider]  atorvastatin (LIPITOR) 80 MG tablet Take 1 tablet (80 mg total) by mouth daily. 11/05/20 11/05/21  Corey Skains, MD  chlorhexidine (PERIDEX) 0.12 % solution 15 mLs by Mouth Rinse route 2 (two) times daily. 05/18/21   [provider]  clopidogrel (PLAVIX) 75 MG tablet Take 1 tablet (75 mg total) by mouth daily. 03/06/20   Ronnell Freshwater, NP  cyclobenzaprine (FLEXERIL) 10 MG tablet Take 1 tablet (10 mg total) by mouth at bedtime. 04/21/21   McDonough, Si Gaul, PA-C  dapagliflozin propanediol (FARXIGA) 10 MG TABS tablet Farxiga 10 mg tablet    [provider]  Dulaglutide (TRULICITY) 1.5 VQ/2.5ZD SOPN INJECT 1.5MG INTO THE SKIN ONCE A WEEK 12/19/20   Lavera Guise, MD  famotidine (PEPCID) 20 MG tablet Take 1 tablet (20 mg total) by mouth 2 (two) times daily. 02/19/21   McDonough, Si Gaul, PA-C  furosemide (LASIX) 40 MG tablet Take 1 tablet (40 mg total) by mouth 2 (two) times daily. 08/20/20   Luiz Ochoa, NP  glucose blood (ACCU-CHEK GUIDE) test strip Use as instructed to check blood sugars twice a day  E11.59 08/20/20   Luiz Ochoa, NP  INVOKANA 300 MG TABS tablet TAKE 1 TABLET BY MOUTH ONCE DAILY BEFORE BREAKFAST 04/03/21    McDonough, Lauren K, PA-C  isosorbide mononitrate (IMDUR) 60 MG 24 hr tablet Take 60 mg by mouth daily.    [provider]  levofloxacin (LEVAQUIN) 500 MG tablet Take 1 tablet (500 mg total) by mouth daily. 03/12/21   Lavera Guise, MD  lidocaine (LIDODERM) 5 % Place 1 patch onto the skin every 12 (twelve) hours. Remove & Discard patch within 12 hours or as directed by MD 03/06/21 03/06/22  Vladimir Crofts, MD  losartan (COZAAR) 100 MG tablet Take 1 tablet (100 mg total) by mouth daily. Take one tab po qd 08/20/20   Luiz Ochoa, NP  metoprolol succinate (TOPROL-XL) 25 MG 24 hr tablet Take 25 mg by mouth daily.    [provider]  montelukast (SINGULAIR) 10 MG tablet Take 1 tablet (10 mg total) by mouth at bedtime. 02/04/20   Lavera Guise, MD  neomycin-polymyxin-dexamethasone (MAXITROL) 0.1 % ophthalmic suspension neomycin-polymyxin-dexameth 3.5 mg/mL-10,000 unit/mL-0.1% eye drops  INSTILL 1 DROP INTO EACH EYE 4 TIMES DAILY FOR 7 DAYS    [provider]  nitroGLYCERIN (NITROSTAT) 0.4 MG SL tablet Place under the tongue.    [provider]  nitroGLYCERIN (NITROSTAT) 0.4 MG SL tablet DISSOLVE ONE TABLET UNDER THE TONGUE EVERY 5 MINUTES AS NEEDED FOR CHEST PAIN.  DO NOT EXCEED A TOTAL OF 3 DOSES IN 15 MINUTES 04/12/21   Lavera Guise, MD  oxybutynin (DITROPAN-XL) 5 MG 24 hr tablet Take 1 tablet (5 mg total) by mouth daily with supper. 02/04/20   Lavera Guise, MD  oxycodone (OXY-IR) 5 MG capsule Take one tab bid fro pain 03/12/21   Lavera Guise, MD  oxyCODONE-acetaminophen (PERCOCET/ROXICET) 5-325 MG tablet Take one tab po bid prn for pain 03/12/21   Lavera Guise, MD  potassium chloride (KLOR-CON) 10 MEQ tablet Take 1 tablet (10 mEq total) by mouth 2 (two) times daily. 03/12/21   Lavera Guise, MD  sertraline (ZOLOFT) 100 MG tablet Take 1 tablet (100 mg total) by mouth daily. 04/21/21   McDonough, Si Gaul, PA-C  zolpidem (AMBIEN) 10 MG tablet Take 1 tablet (10 mg total) by mouth  at bedtime as needed for sleep. 02/19/21   McDonough, Si Gaul, PA-C    Allergies Penicillin g  Family History  Problem Relation Age of Onset   Diabetes Mother    Diabetes Father    Cancer Father    Diabetes Brother     Social History Social History   Tobacco Use   Smoking status: Never   Smokeless tobacco: Never  Vaping Use   Vaping  Use: Never used  Substance Use Topics   Alcohol use: No    Alcohol/week: 0.0 standard drinks   Drug use: No    Review of Systems   Review of Systems  Constitutional:  Positive for diaphoresis. Negative for chills and fever.  Respiratory:  Positive for chest tightness and shortness of breath.   Cardiovascular:  Positive for chest pain. Negative for palpitations and leg swelling.  Gastrointestinal:  Positive for nausea. Negative for abdominal pain.  All other systems reviewed and are negative.  Physical Exam Updated Vital Signs BP (!) 168/98   Pulse 77   Temp 98.6 F (37 C) (Oral)   Resp 14   Ht '5\' 7"'  (1.702 m)   Wt 102.1 kg   SpO2 97%   BMI 35.24 kg/m   Physical Exam Vitals and nursing note reviewed.  Constitutional:      General: She is not in acute distress.    Appearance: Normal appearance.  HENT:     Head: Normocephalic and atraumatic.  Eyes:     General: No scleral icterus.    Conjunctiva/sclera: Conjunctivae normal.  Pulmonary:     Effort: Pulmonary effort is normal. No respiratory distress.     Breath sounds: No stridor.  Musculoskeletal:        General: No deformity or signs of injury.     Cervical back: Normal range of motion.  Skin:    General: Skin is dry.     Coloration: Skin is not jaundiced or pale.  Neurological:     General: No focal deficit present.     Mental Status: She is alert and oriented to person, place, and time. Mental status is at baseline.  Psychiatric:        Mood and Affect: Mood normal.        Behavior: Behavior normal.     LABS (all labs ordered are listed, but only abnormal  results are displayed)  Labs Reviewed  BASIC METABOLIC PANEL - Abnormal; Notable for the following components:      Result Value   Glucose, Bld 106 (*)    All other components within normal limits  CBC - Abnormal; Notable for the following components:   Hemoglobin 11.6 (*)    MCV 75.3 (*)    MCH 23.3 (*)    RDW 19.2 (*)    All other components within normal limits  PROTIME-INR  TROPONIN I (HIGH SENSITIVITY)  TROPONIN I (HIGH SENSITIVITY)  TROPONIN I (HIGH SENSITIVITY)   ____________________________________________  EKG  NSR, nml axis, RBBB no acute ischemic changes-unchanged from prior  ____________________________________________  RADIOLOGY I, Madelin Headings, personally viewed and evaluated these images (plain radiographs) as part of my medical decision making, as well as reviewing the written report by the radiologist.  ED MD interpretation: I reviewed the chest x-ray which shows some linear atelectasis on the right middle lobe    ____________________________________________   PROCEDURES  Procedure(s) performed (including Critical Care):  Procedures   ____________________________________________   INITIAL IMPRESSION / ASSESSMENT AND PLAN / ED COURSE   Patient is a 65 year old female with a significant cardiac history including multiple stents who presents with chest pain.  Pain is typical of her angina but has lasted longer than normal.  She appears well on exam.  Is somewhat hypertensive.  Her EKG is a right bundle branch block with no acute ischemic changes and is unchanged from prior.  Her initial troponin is 4.  We will get a repeat troponin as it has  not been 3 hours since the start of her pain.  Repeat troponin is negative.  Discussed with Dr. Nehemiah Massed who knows the patient well and recommends controlling the patient's blood pressure with IV labetalol and then reassessing her pain.  Patient's blood pressure came down to the 140s over 80s prior to  medication.  Pain was unchanged and she still endorses ongoing 9 out of 10 pain.  We will give a dose of 10 mg of labetalol however with her ongoing pain do feel that she should likely be admitted.     ____________________________________________   FINAL CLINICAL IMPRESSION(S) / ED DIAGNOSES  Final diagnoses:  Chest pain, unspecified type     ED Discharge Orders     None        Note:  This document was prepared using Dragon voice recognition software and may include unintentional dictation errors.    Rada Hay, MD 06/09/21 1538

## 2021-06-09 NOTE — ED Notes (Signed)
Patient made aware IV team called to start and IV. Took ASA as ordered.

## 2021-06-09 NOTE — H&P (Signed)
History and Physical    Felicia Woods YYT:[id number] DOB: 05/01/1956 DOA: 06/09/2021  Referring MD/NP/PA:   PCP: Lavera Guise, MD   Patient coming from:  The patient is coming from home.  At baseline, pt is independent for most of ADL.        Chief Complaint: Chest pain  HPI: Felicia Woods is a 65 y.o. female with medical history significant of hypertension, hyperlipidemia, asthma, GERD, depression, CAD, STEMI, CABG, stent placement, migraine headache, dCHF, right bundle blockage, who presents with chest pain.  Patient states that she has chronic intermittent chest pain which usually resolves with taking nitroglycerin.  She states that her chest pain has worsened since this morning.  It is located in the substernal area, exertional, pressure-like, nonradiating.  It is associated with mild shortness breath, no cough, fever or chills.  Patient also reports diaphoresis.  Denies nausea, vomiting, diarrhea or abdominal pain.  No symptoms of UTI.  ED Course: pt was found to have troponin level 4, 6, WBC 8.7, INR 1.1, negative COVID PCR, GFR> 60, temperature normal, blood pressure 168/98, heart rate 77, RR 24.  Chest x-ray showed some scarring and atelectatic change.  Patient is placed on progressive bed of observation, Dr. Nehemiah Massed of cardiology is consulted.  Review of Systems:   General: no fevers, chills, no body weight gain, has fatigue HEENT: no blurry vision, hearing changes or sore throat Respiratory:  has dyspnea, no coughing, wheezing CV: has chest pain, no palpitations GI: no nausea, vomiting, abdominal pain, diarrhea, constipation GU: no dysuria, burning on urination, increased urinary frequency, hematuria  Ext: no leg edema Neuro: no unilateral weakness, numbness, or tingling, no vision change or hearing loss Skin: no rash, no skin tear. MSK: No muscle spasm, no deformity, no limitation of range of movement in spin Heme: No easy bruising.  Travel history: No recent long  distant travel.  Allergy:  Allergies  Allergen Reactions   Penicillin G Anaphylaxis and Other (See Comments)    Has patient had a PCN reaction causing immediate rash, facial/tongue/throat swelling, SOB or lightheadedness with hypotension: [id number]} Has patient had a PCN reaction causing severe rash involving mucus membranes or skin necrosis: no:[id number]} Has patient had a PCN reaction that required hospitalization no:[id number]} Has patient had a PCN reaction occurring within the last 10 years: no:[id number]} If all of the above answers are "NO", then may proceed with Cephalosporin use.    Past Medical History:  Diagnosis Date   Asthma    Coronary artery disease    Diabetes mellitus without complication ([place])    Heart attack ([place])    Hyperlipidemia    Hypertension    MI (myocardial infarction) ([place])    Migraine headache with aura    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    Past Surgical History:  Procedure Laterality Date   ABDOMINAL HYSTERECTOMY     CARDIAC CATHETERIZATION     CARDIAC CATHETERIZATION N/A 10/18/2015   Procedure: Left Heart Cath and Cors/Grafts Angiography;  Surgeon: Lorretta Harp, MD;  Location: [facility];  Service: Cardiovascular;  Laterality: N/A;   CARDIAC CATHETERIZATION N/A 10/18/2015   Procedure: Coronary Stent Intervention;  Surgeon: Lorretta Harp, MD;  Location: [facility];  Service: Cardiovascular;  Laterality: N/A;   CARDIAC SURGERY     CHOLECYSTECTOMY     CORONARY ANGIOPLASTY     CORONARY ARTERY BYPASS GRAFT     4 vessels - 2010   CORONARY STENT INTERVENTION N/A 02/16/2017  Procedure: CORONARY STENT INTERVENTION;  Surgeon: Yolonda Kida, MD;  Location: [facility];  Service: Cardiovascular;  Laterality: N/A;   CORONARY STENT INTERVENTION N/A 02/13/2019   Procedure: CORONARY STENT INTERVENTION;  Surgeon: Nelva Bush, MD;  Location: [facility];  Service: Cardiovascular;  Laterality: N/A;  SVG to RCA    CORONARY STENT INTERVENTION Left 03/08/2019   Procedure: CORONARY STENT INTERVENTION;  Surgeon: Yolonda Kida, MD;  Location: [facility];  Service: Cardiovascular;  Laterality: Left;   CORONARY STENT INTERVENTION N/A 11/04/2020   Procedure: CORONARY STENT INTERVENTION;  Surgeon: Nelva Bush, MD;  Location: [facility];  Service: Cardiovascular;  Laterality: N/A;   LEFT HEART CATH AND CORONARY ANGIOGRAPHY Left 02/16/2017   Procedure: LEFT HEART CATH AND CORONARY ANGIOGRAPHY;  Surgeon: Corey Skains, MD;  Location: [facility];  Service: Cardiovascular;  Laterality: Left;   LEFT HEART CATH AND CORS/GRAFTS ANGIOGRAPHY Left 11/23/2017   Procedure: LEFT HEART CATH AND CORS/GRAFTS ANGIOGRAPHY;  Surgeon: Corey Skains, MD;  Location: [facility];  Service: Cardiovascular;  Laterality: Left;   LEFT HEART CATH AND CORS/GRAFTS ANGIOGRAPHY N/A 02/13/2019   Procedure: LEFT HEART CATH AND CORS/GRAFTS ANGIOGRAPHY;  Surgeon: Corey Skains, MD;  Location: [facility];  Service: Cardiovascular;  Laterality: N/A;   LEFT HEART CATH AND CORS/GRAFTS ANGIOGRAPHY N/A 07/03/2019   Procedure: LEFT HEART CATH AND CORS/GRAFTS ANGIOGRAPHY;  Surgeon: Corey Skains, MD;  Location: [facility];  Service: Cardiovascular;  Laterality: N/A;   LEFT HEART CATH AND CORS/GRAFTS ANGIOGRAPHY N/A 11/04/2020   Procedure: LEFT HEART CATH AND CORS/GRAFTS ANGIOGRAPHY;  Surgeon: Corey Skains, MD;  Location: [facility];  Service: Cardiovascular;  Laterality: N/A;    Social History:  reports that she has never smoked. She has never used smokeless tobacco. She reports that she does not drink alcohol and does not use drugs.  Family History:  Family History  Problem Relation Age of Onset   Diabetes Mother    Diabetes Father    Cancer Father    Diabetes Brother      Prior to Admission medications   Medication Sig Start Date End Date Taking? Authorizing  Provider  Accu-Chek Softclix Lancets lancets Use as instructed twice a day to check blood sugars  DIAG -E11.59 08/20/20   Luiz Ochoa, NP  albuterol (PROVENTIL HFA;VENTOLIN HFA) 108 (90 Base) MCG/ACT inhaler Inhale 2 puffs into the lungs every 6 (six) hours as needed for wheezing or shortness of breath.    [provider]  amLODipine (NORVASC) 10 MG tablet Take 1 tablet (10 mg total) by mouth daily. 08/20/20   Luiz Ochoa, NP  aspirin EC 81 MG tablet Take 81 mg by mouth daily.    [provider]  atorvastatin (LIPITOR) 80 MG tablet Take 1 tablet (80 mg total) by mouth daily. 11/05/20 11/05/21  Corey Skains, MD  chlorhexidine (PERIDEX) 0.12 % solution 15 mLs by Mouth Rinse route 2 (two) times daily. 05/18/21   [provider]  clopidogrel (PLAVIX) 75 MG tablet Take 1 tablet (75 mg total) by mouth daily. 03/06/20   Ronnell Freshwater, NP  cyclobenzaprine (FLEXERIL) 10 MG tablet Take 1 tablet (10 mg total) by mouth at bedtime. 04/21/21   McDonough, Si Gaul, PA-C  dapagliflozin propanediol (FARXIGA) 10 MG TABS tablet Farxiga 10 mg tablet    [provider]  Dulaglutide (TRULICITY) 1.5 DD/2.2GU SOPN INJECT 1.$RemoveBefor'5MG'[id number]$   INTO THE SKIN ONCE A WEEK 12/19/20   Lavera Guise, MD  famotidine (PEPCID) 20 MG tablet Take 1 tablet (20 mg total) by mouth 2 (two) times daily. 02/19/21   McDonough, Si Gaul, PA-C  furosemide (LASIX) 40 MG tablet Take 1 tablet (40 mg total) by mouth 2 (two) times daily. 08/20/20   Luiz Ochoa, NP  glucose blood (ACCU-CHEK GUIDE) test strip Use as instructed to check blood sugars twice a day  E11.59 08/20/20   Luiz Ochoa, NP  INVOKANA 300 MG TABS tablet TAKE 1 TABLET BY MOUTH ONCE DAILY BEFORE BREAKFAST 04/03/21   McDonough, Lauren K, PA-C  isosorbide mononitrate (IMDUR) 60 MG 24 hr tablet Take 60 mg by mouth daily.    [provider]  levofloxacin (LEVAQUIN) 500 MG tablet Take 1 tablet (500 mg total) by mouth daily. 03/12/21   Lavera Guise, MD  lidocaine (LIDODERM) 5 % Place 1 patch onto the skin every 12 (twelve) hours. Remove & Discard patch within 12 hours or as directed by MD 03/06/21 03/06/22  Vladimir Crofts, MD  losartan (COZAAR) 100 MG tablet Take 1 tablet (100 mg total) by mouth daily. Take one tab po qd 08/20/20   Luiz Ochoa, NP  metoprolol succinate (TOPROL-XL) 25 MG 24 hr tablet Take 25 mg by mouth daily.    [provider]  montelukast (SINGULAIR) 10 MG tablet Take 1 tablet (10 mg total) by mouth at bedtime. 02/04/20   Lavera Guise, MD  neomycin-polymyxin-dexamethasone (MAXITROL) 0.1 % ophthalmic suspension neomycin-polymyxin-dexameth 3.5 mg/mL-10,000 unit/mL-0.1% eye drops  INSTILL 1 DROP INTO EACH EYE 4 TIMES DAILY FOR 7 DAYS    [provider]  nitroGLYCERIN (NITROSTAT) 0.4 MG SL tablet Place under the tongue.    [provider]  nitroGLYCERIN (NITROSTAT) 0.4 MG SL tablet DISSOLVE ONE TABLET UNDER THE TONGUE EVERY 5 MINUTES AS NEEDED FOR CHEST PAIN.  DO NOT EXCEED A TOTAL OF 3 DOSES IN 15 MINUTES 04/12/21   Lavera Guise, MD  oxybutynin (DITROPAN-XL) 5 MG 24 hr tablet Take 1 tablet (5 mg total) by mouth daily with supper. 02/04/20   Lavera Guise, MD  oxycodone (OXY-IR) 5 MG capsule Take one tab bid fro pain 03/12/21   Lavera Guise, MD  oxyCODONE-acetaminophen (PERCOCET/ROXICET) 5-325 MG tablet Take one tab po bid prn for pain 03/12/21   Lavera Guise, MD  potassium chloride (KLOR-CON) 10 MEQ tablet Take 1 tablet (10 mEq total) by mouth 2 (two) times daily. 03/12/21   Lavera Guise, MD  sertraline (ZOLOFT) 100 MG tablet Take 1 tablet (100 mg total) by mouth daily. 04/21/21   McDonough, Si Gaul, PA-C  zolpidem (AMBIEN) 10 MG tablet Take 1 tablet (10 mg total) by mouth at bedtime as needed for sleep. 02/19/21   Mylinda Latina, PA-C    Physical Exam: Vitals:   06/09/21 1500 06/09/21 1520 06/09/21 1530 06/09/21 1534  BP: 140/86 (!) 158/101 (!) 185/100 (!) 168/98  Pulse: 66 68 69 77  Resp: 20 18  (!) 24 14  Temp:      TempSrc:      SpO2: 97% 97% 96% 97%  Weight:      Height:       General: Not in acute distress HEENT:       Eyes: PERRL, EOMI, no scleral icterus.       ENT: No discharge from the ears and nose, no pharynx injection, no tonsillar enlargement.  Neck: No JVD, no bruit, no mass felt. Heme: No neck lymph node enlargement. Cardiac: S1/S2, RRR, No murmurs, No gallops or rubs. Respiratory: No rales, wheezing, rhonchi or rubs. GI: Soft, nondistended, nontender, no rebound pain, no organomegaly, BS present. GU: No hematuria Ext: No pitting leg edema bilaterally. 1+DP/PT pulse bilaterally. Musculoskeletal: No joint deformities, No joint redness or warmth, no limitation of ROM in spin. Skin: No rashes.  Neuro: Alert, oriented X3, cranial nerves II-XII grossly intact, moves all extremities normally.  Psych: Patient is not psychotic, no suicidal or hemocidal ideation.  Labs on Admission: I have personally reviewed following labs and imaging studies  CBC: Recent Labs  Lab 06/09/21 1139  WBC 8.7  HGB 11.6*  HCT 37.5  MCV 75.3*  PLT 308   Basic Metabolic Panel: Recent Labs  Lab 06/09/21 1139  NA 138  K 4.0  CL 103  CO2 27  GLUCOSE 106*  BUN 10  CREATININE 0.54  CALCIUM 9.6   GFR: Estimated Creatinine Clearance: 87.3 mL/min (by C-G formula based on SCr of 0.54 mg/dL). Liver Function Tests: No results for input(s): AST, ALT, ALKPHOS, BILITOT, PROT, ALBUMIN in the last 168 hours. No results for input(s): LIPASE, AMYLASE in the last 168 hours. No results for input(s): AMMONIA in the last 168 hours. Coagulation Profile: Recent Labs  Lab 06/09/21 1139  INR 1.1   Cardiac Enzymes: No results for input(s): CKTOTAL, CKMB, CKMBINDEX, TROPONINI in the last 168 hours. BNP (last 3 results) No results for input(s): PROBNP in the last 8760 hours. HbA1C: No results for input(s): HGBA1C in the last 72 hours. CBG: No results for input(s): GLUCAP in the last  168 hours. Lipid Profile: No results for input(s): CHOL, HDL, LDLCALC, TRIG, CHOLHDL, LDLDIRECT in the last 72 hours. Thyroid Function Tests: No results for input(s): TSH, T4TOTAL, FREET4, T3FREE, THYROIDAB in the last 72 hours. Anemia Panel: No results for input(s): VITAMINB12, FOLATE, FERRITIN, TIBC, IRON, RETICCTPCT in the last 72 hours. Urine analysis:    Component Value Date/Time   COLORURINE YELLOW (A) 03/06/2021 0736   APPEARANCEUR CLOUDY (A) 03/06/2021 0736   APPEARANCEUR Turbid (A) 08/20/2020 0924   LABSPEC 1.031 (H) 03/06/2021 0736   PHURINE 5.0 03/06/2021 0736   GLUCOSEU >=500 (A) 03/06/2021 0736   HGBUR NEGATIVE 03/06/2021 0736   BILIRUBINUR NEGATIVE 03/06/2021 0736   BILIRUBINUR Negative 08/20/2020 0924   KETONESUR NEGATIVE 03/06/2021 0736   PROTEINUR NEGATIVE 03/06/2021 0736   NITRITE NEGATIVE 03/06/2021 0736   LEUKOCYTESUR NEGATIVE 03/06/2021 0736   Sepsis Labs: _0 (procalcitonin:4,lacticidven:4) )No results found for this or any previous visit (from the past 240 hour(s)).   Radiological Exams on Admission: DG Chest 2 View  Result Date: 06/09/2021 CLINICAL DATA:  Chest pain EXAM: CHEST - 2 VIEW COMPARISON:  03/06/2021 FINDINGS: Heart size is normal. Previous median sternotomy and CABG procedure. Blunting of the right costophrenic angle is identified and is favored to represent an area of post inflammatory/infectious pleuroparenchymal scarring. Left mid and right lower lung zone linear opacities are noted which may represent areas of scarring and or subsegmental atelectasis. No signs of pleural effusion, interstitial edema or airspace consolidation. IMPRESSION: Bilateral linear opacities which may represent areas of scarring and/or subsegmental atelectasis. Electronically Signed   By: Kerby Moors M.D.   On: 06/09/2021 12:10     EKG: I have personally reviewed.  Sinus rhythm, QTC 482, right bundle blockade, early R wave  progression  Assessment/Plan Principal Problem:   Chest pain Active Problems:   Type  2 diabetes mellitus with circulatory disorder, without long-term current use of insulin (H[facility])   Essential hypertension   CAD (coronary artery disease)   Asthma   HLD (hyperlipidemia)   Chronic diastolic CHF (congestive heart failure) ([facility])   Chest pain and hx of CAD (coronary artery disease): s/p of CABG and stent placement. Trop negative x 2 so far.  Dr. Nehemiah Massed of cardiology is consulted.  - place to progressive unit for observation - Trend Trop - Repeat EKG in the am  - prn Nitroglycerin, Morphine, and aspirin, lipitor, imdur, plavix - Risk factor stratification: will check FLP and A1C   Type 2 diabetes mellitus with circulatory disorder, without long-term current use of insulin ([place]): Recent A1c 7.0.  Patient is taking Trulicity, Invokana, Farxiga -SSI  Essential hypertension -IV hydralazine as needed -Amlodipine, Cozaar, metoprolol  Asthma: Stable -Bronchodilators  HLD (hyperlipidemia) -Lipitor  Chronic diastolic CHF (congestive heart failure) ([place]): 2D echo 05/31/2019 showed EF of 50-55%.  Patient does not have leg edema or JVD.  CHF is compensated. -Continue home Lasix     DVT ppx: SQ Lovenox Code Status: Full code Family Communication:  Yes, patient's husband   at bed side Disposition Plan:  Anticipate discharge back to previous environment Consults called: Dr. Nehemiah Massed of cardiology Admission status and Level of care: Progressive:   for obs      Status is: Observation  The patient remains OBS appropriate and will d/c before 2 midnights.         Date of Service 06/09/2021    Ivor Costa [facility]   If 7PM-7AM, please contact night-coverage [email or web address] 06/09/2021, 4:52 PM

## 2021-06-09 NOTE — ED Triage Notes (Signed)
Pt reports that she developed left sided chest pain today, took two nitro that did not help. She states that was diaphoretic but did not have any nausea.

## 2021-06-10 ENCOUNTER — Encounter: Payer: Self-pay | Admitting: Internal Medicine

## 2021-06-10 DIAGNOSIS — R0789 Other chest pain: Secondary | ICD-10-CM | POA: Diagnosis not present

## 2021-06-10 DIAGNOSIS — R079 Chest pain, unspecified: Secondary | ICD-10-CM | POA: Diagnosis not present

## 2021-06-10 LAB — CBG MONITORING, ED: Glucose-Capillary: 181 mg/dL — ABNORMAL HIGH (ref 70–99)

## 2021-06-10 LAB — LIPID PANEL
Cholesterol: 181 mg/dL (ref 0–200)
HDL: 32 mg/dL — ABNORMAL LOW (ref >40)
LDL Cholesterol: 125 mg/dL — ABNORMAL HIGH (ref 0–99)
Total CHOL/HDL Ratio: 5.7 RATIO
Triglycerides: 121 mg/dL (ref <150)
VLDL: 24 mg/dL (ref 0–40)

## 2021-06-10 LAB — TROPONIN I (HIGH SENSITIVITY)
Troponin I (High Sensitivity): 4 ng/L (ref <18)
Troponin I (High Sensitivity): 4 ng/L (ref <18)

## 2021-06-10 LAB — HIV ANTIBODY (ROUTINE TESTING W REFLEX): HIV Screen 4th Generation wRfx: NONREACTIVE

## 2021-06-10 MED ORDER — ISOSORBIDE MONONITRATE ER 60 MG PO TB24: 60.0000 mg | ORAL_TABLET | Freq: Every day | ORAL | 2 refills | Status: DC

## 2021-06-10 MED ORDER — FAMOTIDINE 20 MG PO TABS: 20.0000 mg | ORAL_TABLET | Freq: Every day | ORAL | Status: DC

## 2021-06-10 NOTE — Consult Note (Signed)
[facility] Cardiology Consultation Note  Patient ID: Felicia Woods, MRN: [id number], DOB/AGE: 02/21/1956 65 y.o. Admit date: 06/09/2021   Date of Consult: 06/10/2021 Primary Physician: Lavera Guise, MD Primary Cardiologist: Nehemiah Massed  Chief Complaint:  Chief Complaint  Patient presents with   Chest Pain   Reason for Consult:  Chest pain  HPI: 65 y.o. female with known coronary disease status post coronary bypass graft hypertension hyperlipidemia for which the patient has had some recent PCI and stent placement after protracted evidence of chest discomfort.  The patient has been on appropriate medication management for this and doing quite well.  She had an acute episode where she had substernal chest discomfort while at her car and was a unable to walk and and/or do anything.  She did sit down and take a nitroglycerin for which did not particularly help a lot.  She continued to have this chest pain off and on until she was seen in the emergency room.  At that time the patient did have an EKG showing normal sinus rhythm right bundle branch block cannot rule out inferior infarct age undetermined.  This EKG was unchanged from before.  In addition to that the patient has had continued severe chest pain substernally throughout the entire evening with no improvement in particular with nitrates.  Her troponin levels have been normal so there was no evidence of acute coronary syndrome.  There is no current evidence of a congestive heart failure.  She does still have a feeling in her chest but this is mostly resolved at this time.  We have discussed at length the possibility of further diagnostic testing and treatment options including cardiac catheterization to assess coronary anatomy if necessary.  She wishes to continue her current regimen of medication management was currently helping her for and try for medical management at this time  Past Medical History:  Diagnosis Date   Asthma    Coronary  artery disease    Diabetes mellitus without complication ([place])    Heart attack ([place])    Hyperlipidemia    Hypertension    MI (myocardial infarction) ([place])    Migraine headache with aura    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      Surgical History:  Past Surgical History:  Procedure Laterality Date   ABDOMINAL HYSTERECTOMY     CARDIAC CATHETERIZATION     CARDIAC CATHETERIZATION N/A 10/18/2015   Procedure: Left Heart Cath and Cors/Grafts Angiography;  Surgeon: Lorretta Harp, MD;  Location: [place] [facility];  Service: Cardiovascular;  Laterality: N/A;   CARDIAC CATHETERIZATION N/A 10/18/2015   Procedure: Coronary Stent Intervention;  Surgeon: Lorretta Harp, MD;  Location: [place] [facility];  Service: Cardiovascular;  Laterality: N/A;   CARDIAC SURGERY     CHOLECYSTECTOMY     CORONARY ANGIOPLASTY     CORONARY ARTERY BYPASS GRAFT     4 vessels - 2010   CORONARY STENT INTERVENTION N/A 02/16/2017   Procedure: CORONARY STENT INTERVENTION;  Surgeon: Yolonda Kida, MD;  Location: [place] [facility];  Service: Cardiovascular;  Laterality: N/A;   CORONARY STENT INTERVENTION N/A 02/13/2019   Procedure: CORONARY STENT INTERVENTION;  Surgeon: Nelva Bush, MD;  Location: [place] [facility];  Service: Cardiovascular;  Laterality: N/A;  SVG to RCA   CORONARY STENT INTERVENTION Left 03/08/2019   Procedure: CORONARY STENT INTERVENTION;  Surgeon: Yolonda Kida, MD;  Location: [place] [facility];  Service: Cardiovascular;  Laterality: Left;  CORONARY STENT INTERVENTION N/A 11/04/2020   Procedure: CORONARY STENT INTERVENTION;  Surgeon: Nelva Bush, MD;  Location: [place] [facility];  Service: Cardiovascular;  Laterality: N/A;   LEFT HEART CATH AND CORONARY ANGIOGRAPHY Left 02/16/2017   Procedure: LEFT HEART CATH AND CORONARY ANGIOGRAPHY;  Surgeon: Corey Skains, MD;  Location: [place] [facility];  Service: Cardiovascular;  Laterality: Left;   LEFT HEART CATH AND  CORS/GRAFTS ANGIOGRAPHY Left 11/23/2017   Procedure: LEFT HEART CATH AND CORS/GRAFTS ANGIOGRAPHY;  Surgeon: Corey Skains, MD;  Location: [place] [facility];  Service: Cardiovascular;  Laterality: Left;   LEFT HEART CATH AND CORS/GRAFTS ANGIOGRAPHY N/A 02/13/2019   Procedure: LEFT HEART CATH AND CORS/GRAFTS ANGIOGRAPHY;  Surgeon: Corey Skains, MD;  Location: [place] [facility];  Service: Cardiovascular;  Laterality: N/A;   LEFT HEART CATH AND CORS/GRAFTS ANGIOGRAPHY N/A 07/03/2019   Procedure: LEFT HEART CATH AND CORS/GRAFTS ANGIOGRAPHY;  Surgeon: Corey Skains, MD;  Location: [place] [facility];  Service: Cardiovascular;  Laterality: N/A;   LEFT HEART CATH AND CORS/GRAFTS ANGIOGRAPHY N/A 11/04/2020   Procedure: LEFT HEART CATH AND CORS/GRAFTS ANGIOGRAPHY;  Surgeon: Corey Skains, MD;  Location: [place] [facility];  Service: Cardiovascular;  Laterality: N/A;     Home Meds: Prior to Admission medications   Medication Sig Start Date End Date Taking? Authorizing Provider  albuterol (PROVENTIL HFA;VENTOLIN HFA) 108 (90 Base) MCG/ACT inhaler Inhale 2 puffs into the lungs every 6 (six) hours as needed for wheezing or shortness of breath.   Yes [provider]  amLODipine (NORVASC) 10 MG tablet Take 1 tablet (10 mg total) by mouth daily. 08/20/20  Yes Luiz Ochoa, NP  aspirin EC 81 MG tablet Take 81 mg by mouth daily.   Yes [provider]  atorvastatin (LIPITOR) 80 MG tablet Take 1 tablet (80 mg total) by mouth daily. 11/05/20 11/05/21 Yes Corey Skains, MD  clopidogrel (PLAVIX) 75 MG tablet Take 1 tablet (75 mg total) by mouth daily. 03/06/20  Yes Boscia, Greer Ee, NP  cyclobenzaprine (FLEXERIL) 10 MG tablet Take 1 tablet (10 mg total) by mouth at bedtime. 04/21/21  Yes McDonough, Lauren K, PA-C  dapagliflozin propanediol (FARXIGA) 10 MG TABS tablet Farxiga 10 mg tablet   Yes [provider]  Dulaglutide (TRULICITY) 1.5 RC/7.8LF SOPN INJECT 1.5MG INTO  THE SKIN ONCE A WEEK Patient taking differently: Inject 1.5 mg into the muscle every Friday. INJECT 1.5MG INTO THE SKIN ONCE A WEEK 12/19/20  Yes Lavera Guise, MD  famotidine (PEPCID) 20 MG tablet Take 1 tablet (20 mg total) by mouth 2 (two) times daily. Patient taking differently: Take 20 mg by mouth daily. 02/19/21  Yes McDonough, Lauren K, PA-C  furosemide (LASIX) 40 MG tablet Take 1 tablet (40 mg total) by mouth 2 (two) times daily. 08/20/20  Yes Luiz Ochoa, NP  INVOKANA 300 MG TABS tablet TAKE 1 TABLET BY MOUTH ONCE DAILY BEFORE BREAKFAST 04/03/21  Yes McDonough, Lauren K, PA-C  isosorbide mononitrate (IMDUR) 60 MG 24 hr tablet Take 60 mg by mouth daily.   Yes [provider]  losartan (COZAAR) 100 MG tablet Take 1 tablet (100 mg total) by mouth daily. Take one tab po qd 08/20/20  Yes Luiz Ochoa, NP  montelukast (SINGULAIR) 10 MG tablet Take 1 tablet (10 mg total) by mouth at bedtime. 02/04/20  Yes Lavera Guise, MD  nitroGLYCERIN (NITROSTAT) 0.4 MG SL tablet DISSOLVE ONE TABLET UNDER THE TONGUE  EVERY 5 MINUTES AS NEEDED FOR CHEST PAIN.  DO NOT EXCEED A TOTAL OF 3 DOSES IN 15 MINUTES 04/12/21  Yes Lavera Guise, MD  oxybutynin (DITROPAN-XL) 5 MG 24 hr tablet Take 1 tablet (5 mg total) by mouth daily with supper. 02/04/20  Yes Lavera Guise, MD  zolpidem (AMBIEN) 10 MG tablet Take 1 tablet (10 mg total) by mouth at bedtime as needed for sleep. 02/19/21  Yes McDonough, Si Gaul, PA-C  Accu-Chek Softclix Lancets lancets Use as instructed twice a day to check blood sugars  DIAG -E11.59 08/20/20   Luiz Ochoa, NP  chlorhexidine (PERIDEX) 0.12 % solution 15 mLs by Mouth Rinse route 2 (two) times daily. Patient not taking: Reported on 06/09/2021 05/18/21   [provider]  glucose blood (ACCU-CHEK GUIDE) test strip Use as instructed to check blood sugars twice a day  E11.59 08/20/20   Luiz Ochoa, NP  levofloxacin (LEVAQUIN) 500 MG tablet Take 1 tablet (500 mg total) by mouth  daily. Patient not taking: Reported on 06/09/2021 03/12/21   Lavera Guise, MD  lidocaine (LIDODERM) 5 % Place 1 patch onto the skin every 12 (twelve) hours. Remove & Discard patch within 12 hours or as directed by MD Patient not taking: Reported on 06/09/2021 03/06/21 03/06/22  Vladimir Crofts, MD  metoprolol succinate (TOPROL-XL) 25 MG 24 hr tablet Take 25 mg by mouth daily. Patient not taking: Reported on 06/09/2021    [provider]  neomycin-polymyxin-dexamethasone (MAXITROL) 0.1 % ophthalmic suspension neomycin-polymyxin-dexameth 3.5 mg/mL-10,000 unit/mL-0.1% eye drops  INSTILL 1 DROP INTO EACH EYE 4 TIMES DAILY FOR 7 DAYS Patient not taking: Reported on 06/09/2021    [provider]  oxycodone (OXY-IR) 5 MG capsule Take one tab bid fro pain Patient not taking: Reported on 06/09/2021 03/12/21   Lavera Guise, MD  oxyCODONE-acetaminophen (PERCOCET/ROXICET) 5-325 MG tablet Take one tab po bid prn for pain Patient not taking: Reported on 06/09/2021 03/12/21   Lavera Guise, MD  potassium chloride (KLOR-CON) 10 MEQ tablet Take 1 tablet (10 mEq total) by mouth 2 (two) times daily. Patient not taking: Reported on 06/09/2021 03/12/21   Lavera Guise, MD  sertraline (ZOLOFT) 100 MG tablet Take 1 tablet (100 mg total) by mouth daily. Patient not taking: Reported on 06/09/2021 04/21/21   Mylinda Latina, PA-C    Inpatient Medications:   amLODipine  10 mg Oral Daily   aspirin EC  81 mg Oral Daily   atorvastatin  80 mg Oral Daily   clopidogrel  75 mg Oral Daily   cyclobenzaprine  10 mg Oral QHS   enoxaparin (LOVENOX) injection  0.5 mg/kg Subcutaneous Q24H   famotidine  20 mg Oral Daily   furosemide  40 mg Oral BID   insulin aspart  0-5 Units Subcutaneous QHS   insulin aspart  0-9 Units Subcutaneous TID WC   isosorbide mononitrate  60 mg Oral Daily   losartan  100 mg Oral Daily   montelukast  10 mg Oral QHS   oxybutynin  5 mg Oral Q supper     Allergies:  Allergies  Allergen Reactions    Penicillin G Anaphylaxis and Other (See Comments)    Has patient had a PCN reaction causing immediate rash, facial/tongue/throat swelling, SOB or lightheadedness with hypotension: [id number]} Has patient had a PCN reaction causing severe rash involving mucus membranes or skin necrosis: no:[id number]} Has patient had a PCN reaction that required hospitalization no:[id number]} Has patient had a PCN  reaction occurring within the last 10 years: [id number]} If all of the above answers are "NO", then may proceed with Cephalosporin use.    Social History   Socioeconomic History   Marital status: Widowed    Spouse name: Jeneen Rinks   Number of children: Not on file   Years of education: Not on file   Highest education level: Not on file  Occupational History   Not on file  Tobacco Use   Smoking status: Never   Smokeless tobacco: Never  Vaping Use   Vaping Use: Never used  Substance and Sexual Activity   Alcohol use: No    Alcohol/week: 0.0 standard drinks   Drug use: No   Sexual activity: Yes  Other Topics Concern   Not on file  Social History Narrative   Not on file   Social Determinants of Health   Financial Resource Strain: Not on file  Food Insecurity: Not on file  Transportation Needs: Not on file  Physical Activity: Not on file  Stress: Not on file  Social Connections: Not on file  Intimate Partner Violence: Not on file     Family History  Problem Relation Age of Onset   Diabetes Mother    Diabetes Father    Cancer Father    Diabetes Brother      Review of Systems Positive for chest pain Negative for: General:  chills, fever, night sweats or weight changes.  Cardiovascular: PND orthopnea syncope dizziness  Dermatological skin lesions rashes Respiratory: Cough congestion Urologic: Frequent urination urination at night and hematuria Abdominal: negative for nausea, vomiting, diarrhea, bright red blood per rectum, melena, or hematemesis Neurologic: negative for  visual changes, and/or hearing changes  All other systems reviewed and are otherwise negative except as noted above.  Labs: No results for input(s): CKTOTAL, CKMB, TROPONINI in the last 72 hours. Lab Results  Component Value Date   WBC 8.7 06/09/2021   HGB 11.6 (L) 06/09/2021   HCT 37.5 06/09/2021   MCV 75.3 (L) 06/09/2021   PLT 372 06/09/2021    Recent Labs  Lab 06/09/21 1139  NA 138  K 4.0  CL 103  CO2 27  BUN 10  CREATININE 0.54  CALCIUM 9.6  GLUCOSE 106*   Lab Results  Component Value Date   CHOL 181 06/10/2021   HDL 32 (L) 06/10/2021   LDLCALC 125 (H) 06/10/2021   TRIG 121 06/10/2021   No results found for: DDIMER  Radiology/Studies:  DG Chest 2 View  Result Date: 06/09/2021 CLINICAL DATA:  Chest pain EXAM: CHEST - 2 VIEW COMPARISON:  03/06/2021 FINDINGS: Heart size is normal. Previous median sternotomy and CABG procedure. Blunting of the right costophrenic angle is identified and is favored to represent an area of post inflammatory/infectious pleuroparenchymal scarring. Left mid and right lower lung zone linear opacities are noted which may represent areas of scarring and or subsegmental atelectasis. No signs of pleural effusion, interstitial edema or airspace consolidation. IMPRESSION: Bilateral linear opacities which may represent areas of scarring and/or subsegmental atelectasis. Electronically Signed   By: Kerby Moors M.D.   On: 06/09/2021 12:10    EKG: Normal sinus rhythm with right bundle branch block inferior infarct age undetermined  Weights: Filed Weights   06/09/21 1131  Weight: 102.1 kg     Physical Exam: Blood pressure (!) 137/97, pulse 77, temperature 98.4 F (36.9 C), temperature source Oral, resp. rate 20, height _0  (1.702 m), weight 102.1 kg, SpO2 94 %. Body mass index is 35.24  kg/m. General: Well developed, well nourished, in no acute distress. Head eyes ears nose throat: Normocephalic, atraumatic, sclera non-icteric, no xanthomas,  nares are without discharge. No apparent thyromegaly and/or mass  Lungs: Normal respiratory effort.  no wheezes, no rales, no rhonchi.  Heart: RRR with normal S1 S2. no murmur gallop, no rub, PMI is normal size and placement, carotid upstroke normal without bruit, jugular venous pressure is normal Abdomen: Soft, non-tender, non-distended with normoactive bowel sounds. No hepatomegaly. No rebound/guarding. No obvious abdominal masses. Abdominal aorta is normal size without bruit Extremities: No edema. no cyanosis, no clubbing, no ulcers  Peripheral : 2+ bilateral upper extremity pulses, 2+ bilateral femoral pulses, 2+ bilateral dorsal pedal pulse Neuro: Alert and oriented. No facial asymmetry. No focal deficit. Moves all extremities spontaneously. Musculoskeletal: Normal muscle tone without kyphosis Psych:  Responds to questions appropriately with a normal affect.    Assessment: 65 year old female with known coronary disease status post coronary bypass graft status post PCI and stent placement of graft having evidence of atypical chest pain without evidence of a congestive heart failure and or acute coronary syndrome at this time slightly improved  Plan: 1.  No further cardiac diagnostics necessary at this time after discussion with the patient who wishes to try medical management.  Will ambulate and follow for improvements of symptoms and further distinction of type of chest pain 2.  No change in current medical regimen for further risk reduction cardiovascular event including statin therapy antiplatelet therapy antihypertensive therapy and antianginal therapy from before 3.  Begin ambulation and follow-up for improvements of symptoms over the next several hours making decisions based on symptoms.  Patient understands the possibility of cardiac catheterization if necessary for further evaluation and treatment options of anginal symptoms  Signed, Corey Skains M.D. [facility]  Cardiology 06/10/2021, 8:56 AM

## 2021-06-10 NOTE — Discharge Summary (Signed)
Physician Discharge Summary   Felicia Woods  female DOB: [birth year]  JKD:[id number]  PCP: Lavera Guise, MD  Admit date: 06/09/2021 Discharge date: 06/10/2021  Admitted From: home Disposition:  home CODE STATUS: Full code   Hospital Course:  For full details, please see H&P, progress notes, consult notes and ancillary notes.  Briefly,  Felicia Woods is a 65 y.o. female with medical history significant of hypertension, hyperlipidemia, asthma, depression, CAD, STEMI, CABG, stent placement, migraine headache, dCHF, right bundle blockage, who presented with chest pain.  Chest pain, atypical hx of CAD s/p of CABG and stent placement Trop negative x 4.  Dr. Nehemiah Massed of cardiology consulted, rec continuing current medical management. --cont ASA and plavix --cont home statin --cont Imdur and PRN nitro   Type 2 diabetes mellitus with circulatory disorder, without long-term current use of insulin ([place]):  A1c 8.2.  discharge on home Trulicity, Invokana, Farxiga   Essential hypertension --Pt stated Toprol was d/c'ed by cardio a few months ago. --cont Amlodipine, Cozaar   Asthma: Stable   HLD (hyperlipidemia) -Lipitor   Chronic diastolic CHF (congestive heart failure) ([place]):  2D echo 05/31/2019 showed EF of 50-55%.  Patient does not have leg edema or JVD.  CHF is compensated. -Continue home Lasix    Discharge Diagnoses:  Principal Problem:   Chest pain Active Problems:   Type 2 diabetes mellitus with circulatory disorder, without long-term current use of insulin ([facility])   Essential hypertension   CAD (coronary artery disease)   Asthma   HLD (hyperlipidemia)   Chronic diastolic CHF (congestive heart failure) ([place])     Discharge Instructions:  Allergies as of 06/10/2021       Reactions   Penicillin G Anaphylaxis, Other (See Comments)   Has patient had a PCN reaction causing immediate rash, facial/tongue/throat swelling, SOB or lightheadedness with hypotension:  [id number]} Has patient had a PCN reaction causing severe rash involving mucus membranes or skin necrosis: no:[id number]} Has patient had a PCN reaction that required hospitalization no:[id number]} Has patient had a PCN reaction occurring within the last 10 years: no:[id number]} If all of the above answers are "NO", then may proceed with Cephalosporin use.        Medication List     STOP taking these medications    chlorhexidine 0.12 % solution Commonly known as: PERIDEX   lidocaine 5 % Commonly known as: Lidoderm   metoprolol succinate 25 MG 24 hr tablet Commonly known as: TOPROL-XL   neomycin-polymyxin-dexamethasone 0.1 % ophthalmic suspension Commonly known as: MAXITROL   potassium chloride 10 MEQ tablet Commonly known as: KLOR-CON M   sertraline 100 MG tablet Commonly known as: ZOLOFT       TAKE these medications    Accu-Chek Guide test strip Generic drug: glucose blood Use as instructed to check blood sugars twice a day  E11.59   Accu-Chek Softclix Lancets lancets Use as instructed twice a day to check blood sugars  DIAG -E11.59   albuterol 108 (90 Base) MCG/ACT inhaler Commonly known as: VENTOLIN HFA Inhale 2 puffs into the lungs every 6 (six) hours as needed for wheezing or shortness of breath.   amLODipine 10 MG tablet Commonly known as: NORVASC Take 1 tablet (10 mg total) by mouth daily.   aspirin EC 81 MG tablet Take 81 mg by mouth daily.   atorvastatin 80 MG tablet Commonly known as: Lipitor Take 1 tablet (80 mg total) by mouth daily.   clopidogrel 75 MG tablet Commonly known as: PLAVIX Take 1  tablet (75 mg total) by mouth daily.   cyclobenzaprine 10 MG tablet Commonly known as: FLEXERIL Take 1 tablet (10 mg total) by mouth at bedtime.   dapagliflozin propanediol 10 MG Tabs tablet Commonly known as: FARXIGA Farxiga 10 mg tablet   famotidine 20 MG tablet Commonly known as: Pepcid Take 1 tablet (20 mg total) by mouth daily. Home med What  changed:  when to take this additional instructions   furosemide 40 MG tablet Commonly known as: LASIX Take 1 tablet (40 mg total) by mouth 2 (two) times daily.   Invokana 300 MG Tabs tablet Generic drug: canagliflozin TAKE 1 TABLET BY MOUTH ONCE DAILY BEFORE BREAKFAST   isosorbide mononitrate 60 MG 24 hr tablet Commonly known as: IMDUR Take 1 tablet (60 mg total) by mouth daily.   losartan 100 MG tablet Commonly known as: COZAAR Take 1 tablet (100 mg total) by mouth daily. Take one tab po qd   montelukast 10 MG tablet Commonly known as: SINGULAIR Take 1 tablet (10 mg total) by mouth at bedtime.   nitroGLYCERIN 0.4 MG SL tablet Commonly known as: NITROSTAT DISSOLVE ONE TABLET UNDER THE TONGUE EVERY 5 MINUTES AS NEEDED FOR CHEST PAIN.  DO NOT EXCEED A TOTAL OF 3 DOSES IN 15 MINUTES   oxybutynin 5 MG 24 hr tablet Commonly known as: DITROPAN-XL Take 1 tablet (5 mg total) by mouth daily with supper.   Trulicity 1.5 UR/4.2HC Sopn Generic drug: Dulaglutide INJECT 1.5MG  INTO THE SKIN ONCE A WEEK What changed:  how much to take how to take this when to take this   zolpidem 10 MG tablet Commonly known as: AMBIEN Take 1 tablet (10 mg total) by mouth at bedtime as needed for sleep.         Follow-up Information     Corey Skains, MD Follow up in 1 week(s).   Specialty: Cardiology Contact information: [facility] [place] [address] [phone]                 Allergies  Allergen Reactions   Penicillin G Anaphylaxis and Other (See Comments)    Has patient had a PCN reaction causing immediate rash, facial/tongue/throat swelling, SOB or lightheadedness with hypotension: [id number]} Has patient had a PCN reaction causing severe rash involving mucus membranes or skin necrosis: no:[id number]} Has patient had a PCN reaction that required hospitalization no:30480221} Has patient had a PCN reaction occurring within  the last 10 years: no:[id number]} If all of the above answers are "NO", then may proceed with Cephalosporin use.     The results of significant diagnostics from this hospitalization (including imaging, microbiology, ancillary and laboratory) are listed below for reference.   Consultations:   Procedures/Studies: DG Chest 2 View  Result Date: 06/09/2021 CLINICAL DATA:  Chest pain EXAM: CHEST - 2 VIEW COMPARISON:  03/06/2021 FINDINGS: Heart size is normal. Previous median sternotomy and CABG procedure. Blunting of the right costophrenic angle is identified and is favored to represent an area of post inflammatory/infectious pleuroparenchymal scarring. Left mid and right lower lung zone linear opacities are noted which may represent areas of scarring and or subsegmental atelectasis. No signs of pleural effusion, interstitial edema or airspace consolidation. IMPRESSION: Bilateral linear opacities which may represent areas of scarring and/or subsegmental atelectasis. Electronically Signed   By: Kerby Moors M.D.   On: 06/09/2021 12:10      Labs: BNP (last 3 results) Recent Labs    06/09/21 1139  BNP 48.2  Basic Metabolic Panel: Recent Labs  Lab 06/09/21 1139  NA 138  K 4.0  CL 103  CO2 27  GLUCOSE 106*  BUN 10  CREATININE 0.54  CALCIUM 9.6   Liver Function Tests: No results for input(s): AST, ALT, ALKPHOS, BILITOT, PROT, ALBUMIN in the last 168 hours. No results for input(s): LIPASE, AMYLASE in the last 168 hours. No results for input(s): AMMONIA in the last 168 hours. CBC: Recent Labs  Lab 06/09/21 1139  WBC 8.7  HGB 11.6*  HCT 37.5  MCV 75.3*  PLT 372   Cardiac Enzymes: No results for input(s): CKTOTAL, CKMB, CKMBINDEX, TROPONINI in the last 168 hours. BNP: Invalid input(s): POCBNP CBG: Recent Labs  Lab 06/09/21 1721 06/09/21 2129 06/10/21 1135  GLUCAP 139* 156* 181*   D-Dimer No results for input(s): DDIMER in the last 72 hours. Hgb A1c No results for  input(s): HGBA1C in the last 72 hours. Lipid Profile Recent Labs    06/10/21 0445  CHOL 181  HDL 32*  LDLCALC 125*  TRIG 121  CHOLHDL 5.7   Thyroid function studies No results for input(s): TSH, T4TOTAL, T3FREE, THYROIDAB in the last 72 hours.  Invalid input(s): FREET3 Anemia work up No results for input(s): VITAMINB12, FOLATE, FERRITIN, TIBC, IRON, RETICCTPCT in the last 72 hours. Urinalysis    Component Value Date/Time   COLORURINE YELLOW (A) 03/06/2021 0736   APPEARANCEUR CLOUDY (A) 03/06/2021 0736   APPEARANCEUR Turbid (A) 08/20/2020 0924   LABSPEC 1.031 (H) 03/06/2021 0736   PHURINE 5.0 03/06/2021 0736   GLUCOSEU >=500 (A) 03/06/2021 0736   HGBUR NEGATIVE 03/06/2021 0736   BILIRUBINUR NEGATIVE 03/06/2021 0736   BILIRUBINUR Negative 08/20/2020 0924   KETONESUR NEGATIVE 03/06/2021 0736   PROTEINUR NEGATIVE 03/06/2021 0736   NITRITE NEGATIVE 03/06/2021 0736   LEUKOCYTESUR NEGATIVE 03/06/2021 0736   Sepsis Labs Invalid input(s): PROCALCITONIN,  WBC,  LACTICIDVEN Microbiology Recent Results (from the past 240 hour(s))  Resp Panel by RT-PCR (Flu A&B, Covid) Nasopharyngeal Swab     Status: None   Collection Time: 06/09/21  4:09 PM   Specimen: Nasopharyngeal Swab; Nasopharyngeal(NP) swabs in vial transport medium  Result Value Ref Range Status   SARS Coronavirus 2 by RT PCR NEGATIVE NEGATIVE Final    Comment: (NOTE) SARS-CoV-2 target nucleic acids are NOT DETECTED.  The SARS-CoV-2 RNA is generally detectable in upper respiratory specimens during the acute phase of infection. The lowest concentration of SARS-CoV-2 viral copies this assay can detect is 138 copies/mL. A negative result does not preclude SARS-Cov-2 infection and should not be used as the sole basis for treatment or other patient management decisions. A negative result may occur with  improper specimen collection/handling, submission of specimen other than nasopharyngeal swab, presence of viral  mutation(s) within the areas targeted by this assay, and inadequate number of viral copies(<138 copies/mL). A negative result must be combined with clinical observations, patient history, and epidemiological information. The expected result is Negative.  Fact Sheet for Patients:  EntrepreneurPulse.com.au  Fact Sheet for Healthcare Providers:  IncredibleEmployment.be  This test is no t yet approved or cleared by the [place] FDA and  has been authorized for detection and/or diagnosis of SARS-CoV-2 by FDA under an Emergency Use Authorization (EUA). This EUA will remain  in effect (meaning this test can be used) for the duration of the COVID-19 declaration under Section 564(b)(1) of the Act, 21 U.S.C.section 360bbb-3(b)(1), unless the authorization is terminated  or revoked sooner.       Influenza  A by PCR NEGATIVE NEGATIVE Final   Influenza B by PCR NEGATIVE NEGATIVE Final    Comment: (NOTE) The Xpert Xpress SARS-CoV-2/FLU/RSV plus assay is intended as an aid in the diagnosis of influenza from Nasopharyngeal swab specimens and should not be used as a sole basis for treatment. Nasal washings and aspirates are unacceptable for Xpert Xpress SARS-CoV-2/FLU/RSV testing.  Fact Sheet for Patients: EntrepreneurPulse.com.au  Fact Sheet for Healthcare Providers: IncredibleEmployment.be  This test is not yet approved or cleared by the [place] FDA and has been authorized for detection and/or diagnosis of SARS-CoV-2 by FDA under an Emergency Use Authorization (EUA). This EUA will remain in effect (meaning this test can be used) for the duration of the COVID-19 declaration under Section 564(b)(1) of the Act, 21 U.S.C. section 360bbb-3(b)(1), unless the authorization is terminated or revoked.  Performed at [facility], [address] [place], [place] [address]      Total time spend on  discharging this patient, including the last patient exam, discussing the hospital stay, instructions for ongoing care as it relates to all pertinent caregivers, as well as preparing the medical discharge records, prescriptions, and/or referrals as applicable, is 35 minutes.    Enzo Bi, MD  [facility] 06/10/2021, 1:06 PM

## 2021-06-11 LAB — HEMOGLOBIN A1C
Hgb A1c MFr Bld: 8.2 % — ABNORMAL HIGH (ref 4.8–5.6)
Mean Plasma Glucose: 189 mg/dL

## 2021-06-13 DIAGNOSIS — M170 Bilateral primary osteoarthritis of knee: Secondary | ICD-10-CM | POA: Insufficient documentation

## 2021-08-20 ENCOUNTER — Other Ambulatory Visit: Payer: Self-pay | Admitting: Physician Assistant

## 2021-08-21 ENCOUNTER — Ambulatory Visit: Payer: Medicare Other | Admitting: Physician Assistant

## 2021-09-03 ENCOUNTER — Telehealth: Payer: Self-pay

## 2021-09-03 ENCOUNTER — Encounter: Payer: Self-pay | Admitting: Physician Assistant

## 2021-09-03 ENCOUNTER — Other Ambulatory Visit: Payer: Self-pay

## 2021-09-03 ENCOUNTER — Ambulatory Visit (INDEPENDENT_AMBULATORY_CARE_PROVIDER_SITE_OTHER): Payer: Medicare (Managed Care) | Admitting: Physician Assistant

## 2021-09-03 DIAGNOSIS — G4709 Other insomnia: Secondary | ICD-10-CM

## 2021-09-03 DIAGNOSIS — N3281 Overactive bladder: Secondary | ICD-10-CM

## 2021-09-03 DIAGNOSIS — I10 Essential (primary) hypertension: Secondary | ICD-10-CM

## 2021-09-03 DIAGNOSIS — E782 Mixed hyperlipidemia: Secondary | ICD-10-CM

## 2021-09-03 DIAGNOSIS — R159 Full incontinence of feces: Secondary | ICD-10-CM

## 2021-09-03 DIAGNOSIS — R5383 Other fatigue: Secondary | ICD-10-CM

## 2021-09-03 DIAGNOSIS — K219 Gastro-esophageal reflux disease without esophagitis: Secondary | ICD-10-CM

## 2021-09-03 DIAGNOSIS — E1159 Type 2 diabetes mellitus with other circulatory complications: Secondary | ICD-10-CM

## 2021-09-03 DIAGNOSIS — Z955 Presence of coronary angioplasty implant and graft: Secondary | ICD-10-CM

## 2021-09-03 DIAGNOSIS — Z0001 Encounter for general adult medical examination with abnormal findings: Secondary | ICD-10-CM | POA: Diagnosis not present

## 2021-09-03 DIAGNOSIS — E559 Vitamin D deficiency, unspecified: Secondary | ICD-10-CM

## 2021-09-03 DIAGNOSIS — E538 Deficiency of other specified B group vitamins: Secondary | ICD-10-CM

## 2021-09-03 DIAGNOSIS — R7989 Other specified abnormal findings of blood chemistry: Secondary | ICD-10-CM

## 2021-09-03 DIAGNOSIS — E1165 Type 2 diabetes mellitus with hyperglycemia: Secondary | ICD-10-CM

## 2021-09-03 DIAGNOSIS — J302 Other seasonal allergic rhinitis: Secondary | ICD-10-CM

## 2021-09-03 DIAGNOSIS — R300 Dysuria: Secondary | ICD-10-CM

## 2021-09-03 LAB — POCT GLYCOSYLATED HEMOGLOBIN (HGB A1C): Hemoglobin A1C: 7.1 % — AB (ref 4.0–5.6)

## 2021-09-03 MED ORDER — LOSARTAN POTASSIUM 100 MG PO TABS: 100.0000 mg | ORAL_TABLET | Freq: Every day | ORAL | 1 refills | Status: DC

## 2021-09-03 MED ORDER — OXYBUTYNIN CHLORIDE ER 5 MG PO TB24: 5.0000 mg | ORAL_TABLET | Freq: Every day | ORAL | 2 refills | Status: DC

## 2021-09-03 MED ORDER — CLOPIDOGREL BISULFATE 75 MG PO TABS: 75.0000 mg | ORAL_TABLET | Freq: Every day | ORAL | 3 refills | Status: DC

## 2021-09-03 MED ORDER — FAMOTIDINE 20 MG PO TABS: 20.0000 mg | ORAL_TABLET | Freq: Every day | ORAL | Status: DC

## 2021-09-03 MED ORDER — ATORVASTATIN CALCIUM 80 MG PO TABS: 80.0000 mg | ORAL_TABLET | Freq: Every day | ORAL | 2 refills | Status: DC

## 2021-09-03 MED ORDER — FUROSEMIDE 40 MG PO TABS: 40.0000 mg | ORAL_TABLET | Freq: Two times a day (BID) | ORAL | 1 refills | Status: DC

## 2021-09-03 MED ORDER — AMLODIPINE BESYLATE 10 MG PO TABS: 10.0000 mg | ORAL_TABLET | Freq: Every day | ORAL | 1 refills | Status: DC

## 2021-09-03 MED ORDER — TRULICITY 1.5 MG/0.5ML ~~LOC~~ SOAJ: SUBCUTANEOUS | 1 refills | Status: DC

## 2021-09-03 MED ORDER — MONTELUKAST SODIUM 10 MG PO TABS: 10.0000 mg | ORAL_TABLET | Freq: Every day | ORAL | 3 refills | Status: DC

## 2021-09-03 MED ORDER — ZOLPIDEM TARTRATE 5 MG PO TABS: 5.0000 mg | ORAL_TABLET | Freq: Every evening | ORAL | 1 refills | Status: DC | PRN

## 2021-09-03 MED ORDER — CANAGLIFLOZIN 300 MG PO TABS: 300.0000 mg | ORAL_TABLET | Freq: Every day | ORAL | 1 refills | Status: DC

## 2021-09-03 NOTE — Progress Notes (Signed)
[facility] [place], [place] [address]  Internal MEDICINE  Office Visit Note  Patient Name: Felicia Woods  [id number]  [id number]  Date of Service: 09/04/2021  Chief Complaint  Patient presents with   Medicare Wellness   Diabetes   Hypertension   Hyperlipidemia   Cough    Trying all OTC meds for cough but nothing is helping - feels like choking at night     HPI Pt is here for routine health maintenance examination -Reports she has recently been experiencing a cough at night that almost feels like she is choking.  Previous sleep study that was normal.  Does states she has been without her reflux medication and therefore we will restart this now and see if this helps -Has been losing weight -Sleep has been difficult -Goes back to Dr. Nehemiah Massed next week -pt has been out of her medications for a little while, she has been working on diet in the meantime and has been losing weight and BG have staying controlled despite med lapse. BG 91-100 fasting.  -For the last month or so having some bowel incontinence.  She denies any other neurologic symptoms such as numbness tingling or changes in sensation elsewhere.  She denies any issues with urinary incontinence, saddle paresthesias, or any back pain.  Denies any acute injuries or recent antibiotic use.  Denies any N/V or abdominal pain. She is requesting GI referral at this time though we discussed other signs and symptoms to look out for as these can be an emergent situation.  Discussed sending urgent GI referral since isolated bowel incontinence without any other symptoms -Due for routine fasting labs -mammogram done in Dec -pt has eye exam after visit today  Current Medication: Outpatient Encounter Medications as of 09/03/2021  Medication Sig   Accu-Chek Softclix Lancets lancets Use as instructed twice a day to check blood sugars  DIAG -E11.59   albuterol (PROVENTIL HFA;VENTOLIN HFA) 108 (90 Base) MCG/ACT inhaler  Inhale 2 puffs into the lungs every 6 (six) hours as needed for wheezing or shortness of breath.   aspirin EC 81 MG tablet Take 81 mg by mouth daily.   cyclobenzaprine (FLEXERIL) 10 MG tablet Take 1 tablet (10 mg total) by mouth at bedtime.   glucose blood (ACCU-CHEK GUIDE) test strip Use as instructed to check blood sugars twice a day  E11.59   isosorbide mononitrate (IMDUR) 60 MG 24 hr tablet Take 1 tablet (60 mg total) by mouth daily.   nitroGLYCERIN (NITROSTAT) 0.4 MG SL tablet DISSOLVE ONE TABLET UNDER THE TONGUE EVERY 5 MINUTES AS NEEDED FOR CHEST PAIN.  DO NOT EXCEED A TOTAL OF 3 DOSES IN 15 MINUTES   zolpidem (AMBIEN) 5 MG tablet Take 1 tablet (5 mg total) by mouth at bedtime as needed for sleep.   [DISCONTINUED] amLODipine (NORVASC) 10 MG tablet Take 1 tablet (10 mg total) by mouth daily.   [DISCONTINUED] atorvastatin (LIPITOR) 80 MG tablet Take 1 tablet (80 mg total) by mouth daily.   [DISCONTINUED] clopidogrel (PLAVIX) 75 MG tablet Take 1 tablet (75 mg total) by mouth daily.   [DISCONTINUED] dapagliflozin propanediol (FARXIGA) 10 MG TABS tablet Farxiga 10 mg tablet   [DISCONTINUED] Dulaglutide (TRULICITY) 1.5 DG/6.4QI SOPN INJECT 1.5MG INTO THE SKIN ONCE A WEEK (Patient taking differently: Inject 1.5 mg into the muscle every Friday. INJECT 1.5MG INTO THE SKIN ONCE A WEEK)   [DISCONTINUED] famotidine (PEPCID) 20 MG tablet Take 1 tablet (20 mg total) by mouth daily. Home med   [  DISCONTINUED] furosemide (LASIX) 40 MG tablet Take 1 tablet (40 mg total) by mouth 2 (two) times daily.   [DISCONTINUED] INVOKANA 300 MG TABS tablet TAKE 1 TABLET BY MOUTH ONCE DAILY BEFORE BREAKFAST   [DISCONTINUED] losartan (COZAAR) 100 MG tablet Take 1 tablet (100 mg total) by mouth daily. Take one tab po qd   [DISCONTINUED] montelukast (SINGULAIR) 10 MG tablet Take 1 tablet (10 mg total) by mouth at bedtime.   [DISCONTINUED] oxybutynin (DITROPAN-XL) 5 MG 24 hr tablet Take 1 tablet (5 mg total) by mouth daily with  supper.   [DISCONTINUED] zolpidem (AMBIEN) 10 MG tablet Take 1 tablet (10 mg total) by mouth at bedtime as needed for sleep.   amLODipine (NORVASC) 10 MG tablet Take 1 tablet (10 mg total) by mouth daily.   atorvastatin (LIPITOR) 80 MG tablet Take 1 tablet (80 mg total) by mouth daily.   canagliflozin (INVOKANA) 300 MG TABS tablet Take 1 tablet (300 mg total) by mouth daily before breakfast.   clopidogrel (PLAVIX) 75 MG tablet Take 1 tablet (75 mg total) by mouth daily.   Dulaglutide (TRULICITY) 1.5 HW/2.9HB SOPN INJECT 1.5MG INTO THE SKIN ONCE A WEEK   famotidine (PEPCID) 20 MG tablet Take 1 tablet (20 mg total) by mouth daily. Home med   furosemide (LASIX) 40 MG tablet Take 1 tablet (40 mg total) by mouth 2 (two) times daily.   losartan (COZAAR) 100 MG tablet Take 1 tablet (100 mg total) by mouth daily. Take one tab po qd   montelukast (SINGULAIR) 10 MG tablet Take 1 tablet (10 mg total) by mouth at bedtime.   oxybutynin (DITROPAN-XL) 5 MG 24 hr tablet Take 1 tablet (5 mg total) by mouth daily with supper.   No facility-administered encounter medications on file as of 09/03/2021.    Surgical History: Past Surgical History:  Procedure Laterality Date   ABDOMINAL HYSTERECTOMY     CARDIAC CATHETERIZATION     CARDIAC CATHETERIZATION N/A 10/18/2015   Procedure: Left Heart Cath and Cors/Grafts Angiography;  Surgeon: Lorretta Harp, MD;  Location: [facility];  Service: Cardiovascular;  Laterality: N/A;   CARDIAC CATHETERIZATION N/A 10/18/2015   Procedure: Coronary Stent Intervention;  Surgeon: Lorretta Harp, MD;  Location: [facility];  Service: Cardiovascular;  Laterality: N/A;   CARDIAC SURGERY     CHOLECYSTECTOMY     CORONARY ANGIOPLASTY     CORONARY ARTERY BYPASS GRAFT     4 vessels - 2010   CORONARY STENT INTERVENTION N/A 02/16/2017   Procedure: CORONARY STENT INTERVENTION;  Surgeon: Yolonda Kida, MD;  Location: [facility];  Service: Cardiovascular;   Laterality: N/A;   CORONARY STENT INTERVENTION N/A 02/13/2019   Procedure: CORONARY STENT INTERVENTION;  Surgeon: Nelva Bush, MD;  Location: [facility];  Service: Cardiovascular;  Laterality: N/A;  SVG to RCA   CORONARY STENT INTERVENTION Left 03/08/2019   Procedure: CORONARY STENT INTERVENTION;  Surgeon: Yolonda Kida, MD;  Location: [facility];  Service: Cardiovascular;  Laterality: Left;   CORONARY STENT INTERVENTION N/A 11/04/2020   Procedure: CORONARY STENT INTERVENTION;  Surgeon: Nelva Bush, MD;  Location: [facility];  Service: Cardiovascular;  Laterality: N/A;   LEFT HEART CATH AND CORONARY ANGIOGRAPHY Left 02/16/2017   Procedure: LEFT HEART CATH AND CORONARY ANGIOGRAPHY;  Surgeon: Corey Skains, MD;  Location: [facility];  Service: Cardiovascular;  Laterality: Left;   LEFT HEART CATH AND CORS/GRAFTS ANGIOGRAPHY Left 11/23/2017  Procedure: LEFT HEART CATH AND CORS/GRAFTS ANGIOGRAPHY;  Surgeon: Corey Skains, MD;  Location: [facility];  Service: Cardiovascular;  Laterality: Left;   LEFT HEART CATH AND CORS/GRAFTS ANGIOGRAPHY N/A 02/13/2019   Procedure: LEFT HEART CATH AND CORS/GRAFTS ANGIOGRAPHY;  Surgeon: Corey Skains, MD;  Location: [facility];  Service: Cardiovascular;  Laterality: N/A;   LEFT HEART CATH AND CORS/GRAFTS ANGIOGRAPHY N/A 07/03/2019   Procedure: LEFT HEART CATH AND CORS/GRAFTS ANGIOGRAPHY;  Surgeon: Corey Skains, MD;  Location: [facility];  Service: Cardiovascular;  Laterality: N/A;   LEFT HEART CATH AND CORS/GRAFTS ANGIOGRAPHY N/A 11/04/2020   Procedure: LEFT HEART CATH AND CORS/GRAFTS ANGIOGRAPHY;  Surgeon: Corey Skains, MD;  Location: [facility];  Service: Cardiovascular;  Laterality: N/A;    Medical History: Past Medical History:  Diagnosis Date   Asthma    Coronary artery disease    Diabetes mellitus without complication ([place])    Heart attack ([place])     Hyperlipidemia    Hypertension    MI (myocardial infarction) ([place])    Migraine headache with aura    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    Family History: Family History  Problem Relation Age of Onset   Diabetes Mother    Diabetes Father    Cancer Father    Diabetes Brother       Review of Systems  Constitutional:  Negative for chills, fatigue and unexpected weight change.  HENT:  Negative for congestion, postnasal drip, rhinorrhea, sneezing and sore throat.   Eyes:  Negative for redness.  Respiratory:  Positive for cough. Negative for chest tightness and shortness of breath.   Cardiovascular:  Negative for chest pain and palpitations.  Gastrointestinal:  Positive for diarrhea. Negative for abdominal pain, anal bleeding, blood in stool, constipation, nausea, rectal pain and vomiting.       Incontinence of bowels  Genitourinary:  Negative for dysuria and frequency.  Musculoskeletal:  Negative for arthralgias, back pain, joint swelling and neck pain.  Skin:  Negative for rash.  Neurological: Negative.  Negative for tremors and numbness.  Hematological:  Negative for adenopathy. Does not bruise/bleed easily.  Psychiatric/Behavioral:  Positive for sleep disturbance. Negative for behavioral problems (Depression) and suicidal ideas. The patient is not nervous/anxious.     Vital Signs: BP 135/83    Pulse 72    Temp 98.4 F (36.9 C)    Resp 16    Ht _0  (1.702 m)    Wt 233 lb (105.7 kg)    SpO2 96%    BMI 36.49 kg/m    Physical Exam Vitals and nursing note reviewed.  Constitutional:      General: She is not in acute distress.    Appearance: She is well-developed. She is obese. She is not diaphoretic.  HENT:     Head: Normocephalic and atraumatic.     Mouth/Throat:     Pharynx: No oropharyngeal exudate.  Eyes:     Pupils: Pupils are equal, round, and reactive to light.  Neck:     Thyroid: No thyromegaly.     Vascular: No JVD.     Trachea: No tracheal deviation.   Cardiovascular:     Rate and Rhythm: Normal rate and regular rhythm.     Heart sounds: Normal heart sounds. No murmur heard.   No friction rub. No gallop.  Pulmonary:     Effort: Pulmonary effort is normal. No respiratory distress.  Breath sounds: No wheezing or rales.  Chest:     Chest wall: No tenderness.  Abdominal:     General: Bowel sounds are normal. There is no distension.     Palpations: Abdomen is soft.     Tenderness: There is no abdominal tenderness.  Musculoskeletal:        General: No tenderness. Normal range of motion.     Cervical back: Normal range of motion and neck supple.     Left lower leg: No edema.  Lymphadenopathy:     Cervical: No cervical adenopathy.  Skin:    General: Skin is warm and dry.  Neurological:     Mental Status: She is alert and oriented to person, place, and time.     Cranial Nerves: No cranial nerve deficit.     Sensory: No sensory deficit.     Motor: No weakness.  Psychiatric:        Behavior: Behavior normal.        Thought Content: Thought content normal.        Judgment: Judgment normal.     LABS: Recent Results (from the past 2160 hour(s))  Basic metabolic panel     Status: Abnormal   Collection Time: 06/09/21 11:39 AM  Result Value Ref Range   Sodium 138 135 - 145 mmol/L   Potassium 4.0 3.5 - 5.1 mmol/L   Chloride 103 98 - 111 mmol/L   CO2 27 22 - 32 mmol/L   Glucose, Bld 106 (H) 70 - 99 mg/dL    Comment: Glucose reference range applies only to samples taken after fasting for at least 8 hours.   BUN 10 8 - 23 mg/dL   Creatinine, Ser 0.54 0.44 - 1.00 mg/dL   Calcium 9.6 8.9 - 10.3 mg/dL   GFR, Estimated >60 >60 mL/min    Comment: (NOTE) Calculated using the CKD-EPI Creatinine Equation (2021)    Anion gap 8 5 - 15    Comment: Performed at [facility], [address] [place], [place] [address]  CBC     Status: Abnormal   Collection Time: 06/09/21 11:39 AM  Result Value Ref Range   WBC 8.7 4.0 - 10.5  K/uL   RBC 4.98 3.87 - 5.11 MIL/uL   Hemoglobin 11.6 (L) 12.0 - 15.0 g/dL   HCT 37.5 36.0 - 46.0 %   MCV 75.3 (L) 80.0 - 100.0 fL   MCH 23.3 (L) 26.0 - 34.0 pg   MCHC 30.9 30.0 - 36.0 g/dL   RDW 19.2 (H) 11.5 - 15.5 %   Platelets 372 150 - 400 K/uL   nRBC 0.0 0.0 - 0.2 %    Comment: Performed at [facility], [place], [place] [address]  Troponin I (High Sensitivity)     Status: None   Collection Time: 06/09/21 11:39 AM  Result Value Ref Range   Troponin I (High Sensitivity) 4 <18 ng/L    Comment: (NOTE) Elevated high sensitivity troponin I (hsTnI) values and significant  changes across serial measurements may suggest ACS but many other  chronic and acute conditions are known to elevate hsTnI results.  Refer to the "Links" section for chest pain algorithms and additional  guidance. Performed at [facility], [address] [place], [place] [address]   Protime-INR     Status: None   Collection Time: 06/09/21 11:39 AM  Result Value Ref Range   Prothrombin Time 14.6 11.4 - 15.2 seconds   INR 1.1 0.8 -  1.2    Comment: (NOTE) INR goal varies based on device and disease states. Performed at [facility], [address] [place], [place] [address]   Brain natriuretic peptide     Status: None   Collection Time: 06/09/21 11:39 AM  Result Value Ref Range   B Natriuretic Peptide 48.2 0.0 - 100.0 pg/mL    Comment: Performed at [facility], [place], [place] [address]  Troponin I (High Sensitivity)     Status: None   Collection Time: 06/09/21  1:43 PM  Result Value Ref Range   Troponin I (High Sensitivity) 6 <18 ng/L    Comment: (NOTE) Elevated high sensitivity troponin I (hsTnI) values and significant  changes across serial measurements may suggest ACS but many other  chronic and acute conditions are known to elevate hsTnI results.  Refer to the "Links" section for chest pain algorithms and additional   guidance. Performed at [facility], [address] [place], [place] [address]   Resp Panel by RT-PCR (Flu A&B, Covid) Nasopharyngeal Swab     Status: None   Collection Time: 06/09/21  4:09 PM   Specimen: Nasopharyngeal Swab; Nasopharyngeal(NP) swabs in vial transport medium  Result Value Ref Range   SARS Coronavirus 2 by RT PCR NEGATIVE NEGATIVE    Comment: (NOTE) SARS-CoV-2 target nucleic acids are NOT DETECTED.  The SARS-CoV-2 RNA is generally detectable in upper respiratory specimens during the acute phase of infection. The lowest concentration of SARS-CoV-2 viral copies this assay can detect is 138 copies/mL. A negative result does not preclude SARS-Cov-2 infection and should not be used as the sole basis for treatment or other patient management decisions. A negative result may occur with  improper specimen collection/handling, submission of specimen other than nasopharyngeal swab, presence of viral mutation(s) within the areas targeted by this assay, and inadequate number of viral copies(<138 copies/mL). A negative result must be combined with clinical observations, patient history, and epidemiological information. The expected result is Negative.  Fact Sheet for Patients:  EntrepreneurPulse.com.au  Fact Sheet for Healthcare Providers:  IncredibleEmployment.be  This test is no t yet approved or cleared by the [place] FDA and  has been authorized for detection and/or diagnosis of SARS-CoV-2 by FDA under an Emergency Use Authorization (EUA). This EUA will remain  in effect (meaning this test can be used) for the duration of the COVID-19 declaration under Section 564(b)(1) of the Act, 21 U.S.C.section 360bbb-3(b)(1), unless the authorization is terminated  or revoked sooner.       Influenza A by PCR NEGATIVE NEGATIVE   Influenza B by PCR NEGATIVE NEGATIVE    Comment: (NOTE) The Xpert Xpress SARS-CoV-2/FLU/RSV plus  assay is intended as an aid in the diagnosis of influenza from Nasopharyngeal swab specimens and should not be used as a sole basis for treatment. Nasal washings and aspirates are unacceptable for Xpert Xpress SARS-CoV-2/FLU/RSV testing.  Fact Sheet for Patients: EntrepreneurPulse.com.au  Fact Sheet for Healthcare Providers: IncredibleEmployment.be  This test is not yet approved or cleared by the [place] FDA and has been authorized for detection and/or diagnosis of SARS-CoV-2 by FDA under an Emergency Use Authorization (EUA). This EUA will remain in effect (meaning this test can be used) for the duration of the COVID-19 declaration under Section 564(b)(1) of the Act, 21 U.S.C. section 360bbb-3(b)(1), unless the authorization is terminated or revoked.  Performed at [facility], [address] [place], [place] [address]   CBG monitoring, ED  Status: Abnormal   Collection Time: 06/09/21  5:21 PM  Result Value Ref Range   Glucose-Capillary 139 (H) 70 - 99 mg/dL    Comment: Glucose reference range applies only to samples taken after fasting for at least 8 hours.  CBG monitoring, ED     Status: Abnormal   Collection Time: 06/09/21  9:29 PM  Result Value Ref Range   Glucose-Capillary 156 (H) 70 - 99 mg/dL    Comment: Glucose reference range applies only to samples taken after fasting for at least 8 hours.  Lipid panel     Status: Abnormal   Collection Time: 06/10/21  4:45 AM  Result Value Ref Range   Cholesterol 181 0 - 200 mg/dL   Triglycerides 121 <150 mg/dL   HDL 32 (L) >40 mg/dL   Total CHOL/HDL Ratio 5.7 RATIO   VLDL 24 0 - 40 mg/dL   LDL Cholesterol 125 (H) 0 - 99 mg/dL    Comment:        Total Cholesterol/HDL:CHD Risk Coronary Heart Disease Risk Table                     Men   Women  1/2 Average Risk   3.4   3.3  Average Risk       5.0   4.4  2 X Average Risk   9.6   7.1  3 X Average Risk  23.4   11.0         Use the calculated Patient Ratio above and the CHD Risk Table to determine the patient's CHD Risk.        ATP III CLASSIFICATION (LDL):  <100     mg/dL   Optimal  100-129  mg/dL   Near or Above                    Optimal  130-159  mg/dL   Borderline  160-189  mg/dL   High  >190     mg/dL   Very High Performed at [facility], [place], [place] [address]   Troponin I (High Sensitivity)     Status: None   Collection Time: 06/10/21  4:45 AM  Result Value Ref Range   Troponin I (High Sensitivity) 4 <18 ng/L    Comment: (NOTE) Elevated high sensitivity troponin I (hsTnI) values and significant  changes across serial measurements may suggest ACS but many other  chronic and acute conditions are known to elevate hsTnI results.  Refer to the "Links" section for chest pain algorithms and additional  guidance. Performed at [facility], [address] [place], [place] [address]   HIV Antibody (routine testing w rflx)     Status: None   Collection Time: 06/10/21  6:55 AM  Result Value Ref Range   HIV Screen 4th Generation wRfx Non Reactive Non Reactive    Comment: Performed at [facility], [place] [address] [place], [place] [address]  Troponin I (High Sensitivity)     Status: None   Collection Time: 06/10/21  6:55 AM  Result Value Ref Range   Troponin I (High Sensitivity) 4 <18 ng/L    Comment: (NOTE) Elevated high sensitivity troponin I (hsTnI) values and significant  changes across serial measurements may suggest ACS but many other  chronic and acute conditions are known to elevate hsTnI results.  Refer to the "Links" section for chest pain algorithms and additional  guidance. Performed at [facility],  [place], [place] [address]   Hemoglobin A1c     Status: Abnormal   Collection Time: 06/10/21  6:55 AM  Result Value Ref Range   Hgb A1c MFr Bld 8.2 (H) 4.8 - 5.6 %    Comment: (NOTE)         Prediabetes: 5.7 -  6.4         Diabetes: >6.4         Glycemic control for adults with diabetes: <7.0    Mean Plasma Glucose 189 mg/dL    Comment: (NOTE) Performed At: [facility] [place], [place] [id number] Rush Farmer MD [id number]   CBG monitoring, ED     Status: Abnormal   Collection Time: 06/10/21 11:35 AM  Result Value Ref Range   Glucose-Capillary 181 (H) 70 - 99 mg/dL    Comment: Glucose reference range applies only to samples taken after fasting for at least 8 hours.   Comment 1 Notify RN    Comment 2 Document in Chart   POCT HgB A1C     Status: Abnormal   Collection Time: 09/03/21  2:13 PM  Result Value Ref Range   Hemoglobin A1C 7.1 (A) 4.0 - 5.6 %   HbA1c POC (<> result, manual entry)     HbA1c, POC (prediabetic range)     HbA1c, POC (controlled diabetic range)    UA/M w/rflx Culture, Routine     Status: Abnormal   Collection Time: 09/03/21  3:45 PM   Specimen: Urine   Urine  Result Value Ref Range   Specific Gravity, UA 1.018 1.005 - 1.030   pH, UA 5.5 5.0 - 7.5   Color, UA Yellow Yellow   Appearance Ur Clear Clear   Leukocytes,UA Negative Negative   Protein,UA Negative Negative/Trace   Glucose, UA 2+ (A) Negative   Ketones, UA Negative Negative   RBC, UA Negative Negative   Bilirubin, UA Negative Negative   Urobilinogen, Ur 1.0 0.2 - 1.0 mg/dL   Nitrite, UA Negative Negative   Microscopic Examination Comment     Comment: Microscopic follows if indicated.   Microscopic Examination See below:     Comment: Microscopic was indicated and was performed.   Urinalysis Reflex Comment     Comment: This specimen will not reflex to a Urine Culture.  Microscopic Examination     Status: None   Collection Time: 09/03/21  3:45 PM   Urine  Result Value Ref Range   WBC, UA None seen 0 - 5 /hpf   RBC None seen 0 - 2 /hpf   Epithelial Cells (non renal) 0-10 0 - 10 /hpf   Casts None seen None seen /lpf   Bacteria, UA None seen None seen/Few        Assessment/Plan: 1. Encounter for general adult medical examination with abnormal findings CPE performed, routine fasting labs ordered, up-to-date on mammogram has eye exam scheduled for today.  Due for colonoscopy next year however due to acute concerns will see GI sooner  2. Type 2 diabetes mellitus with other circulatory complication, without long-term current use of insulin (HCC) - POCT HgB A1C 1 which is improved from 8.2 at last check despite patient being without medications briefly.  Will restart home medications continue to monitor closely.  Continue to work on diet and exercise - Dulaglutide (TRULICITY) 1.5 XK/4.8JE SOPN; INJECT 1.5MG INTO THE SKIN ONCE A WEEK  Dispense: 12 mL; Refill: 1 - canagliflozin (INVOKANA) 300 MG TABS tablet; Take 1  tablet (300 mg total) by mouth daily before breakfast.  Dispense: 90 tablet; Refill: 1  3. Essential hypertension Stable, continue current medications - amLODipine (NORVASC) 10 MG tablet; Take 1 tablet (10 mg total) by mouth daily.  Dispense: 90 tablet; Refill: 1 - furosemide (LASIX) 40 MG tablet; Take 1 tablet (40 mg total) by mouth 2 (two) times daily.  Dispense: 180 tablet; Refill: 1 - losartan (COZAAR) 100 MG tablet; Take 1 tablet (100 mg total) by mouth daily. Take one tab po qd  Dispense: 90 tablet; Refill: 1  4. Gastroesophageal reflux disease without esophagitis We will restart on Pepcid as this may be contributing to nighttime cough - famotidine (PEPCID) 20 MG tablet; Take 1 tablet (20 mg total) by mouth daily. Home med  5. OAB (overactive bladder) - oxybutynin (DITROPAN-XL) 5 MG 24 hr tablet; Take 1 tablet (5 mg total) by mouth daily with supper.  Dispense: 90 tablet; Refill: 2  6. S/P drug eluting coronary stent placement - clopidogrel (PLAVIX) 75 MG tablet; Take 1 tablet (75 mg total) by mouth daily.  Dispense: 90 tablet; Refill: 3  7. Seasonal allergies - montelukast (SINGULAIR) 10 MG tablet; Take 1 tablet (10 mg total)  by mouth at bedtime.  Dispense: 90 tablet; Refill: 3  8. Other insomnia - zolpidem (AMBIEN) 5 MG tablet; Take 1 tablet (5 mg total) by mouth at bedtime as needed for sleep.  Dispense: 15 tablet; Refill: 1  9. Incontinence of feces, unspecified fecal incontinence type We will send urgent referral to GI due to incontinence of feces recently.  Patient denies any other symptoms but was educated on other signs and symptoms to look out for and to contact office or go to ED if any of these should arise - Ambulatory referral to Gastroenterology  10. Mixed hyperlipidemia - Lipid Panel With LDL/HDL Ratio  11. B12 deficiency - B12 and Folate Panel  12. Vitamin D deficiency - VITAMIN D 25 Hydroxy (Vit-D Deficiency, Fractures)  13. Abnormal thyroid blood test - TSH + free T4  14. Other fatigue - CBC w/Diff/Platelet - Comprehensive metabolic panel  15. Dysuria - UA/M w/rflx Culture, Routine   General Counseling: Vayla verbalizes understanding of the findings of todays visit and agrees with plan of treatment. I have discussed any further diagnostic evaluation that may be needed or ordered today. We also reviewed her medications today. she has been encouraged to call the office with any questions or concerns that should arise related to todays visit.    Counseling:    Orders Placed This Encounter  Procedures   Microscopic Examination   UA/M w/rflx Culture, Routine   CBC w/Diff/Platelet   Comprehensive metabolic panel   TSH + free T4   Lipid Panel With LDL/HDL Ratio   B12 and Folate Panel   VITAMIN D 25 Hydroxy (Vit-D Deficiency, Fractures)   Ambulatory referral to Gastroenterology   POCT HgB A1C    Meds ordered this encounter  Medications   amLODipine (NORVASC) 10 MG tablet    Sig: Take 1 tablet (10 mg total) by mouth daily.    Dispense:  90 tablet    Refill:  1   atorvastatin (LIPITOR) 80 MG tablet    Sig: Take 1 tablet (80 mg total) by mouth daily.    Dispense:  30  tablet    Refill:  2   clopidogrel (PLAVIX) 75 MG tablet    Sig: Take 1 tablet (75 mg total) by mouth daily.    Dispense:  90  tablet    Refill:  3   Dulaglutide (TRULICITY) 1.5 ZX/2.8FV SOPN    Sig: INJECT 1.5MG INTO THE SKIN ONCE A WEEK    Dispense:  12 mL    Refill:  1   famotidine (PEPCID) 20 MG tablet    Sig: Take 1 tablet (20 mg total) by mouth daily. Home med   furosemide (LASIX) 40 MG tablet    Sig: Take 1 tablet (40 mg total) by mouth 2 (two) times daily.    Dispense:  180 tablet    Refill:  1   canagliflozin (INVOKANA) 300 MG TABS tablet    Sig: Take 1 tablet (300 mg total) by mouth daily before breakfast.    Dispense:  90 tablet    Refill:  1   losartan (COZAAR) 100 MG tablet    Sig: Take 1 tablet (100 mg total) by mouth daily. Take one tab po qd    Dispense:  90 tablet    Refill:  1   montelukast (SINGULAIR) 10 MG tablet    Sig: Take 1 tablet (10 mg total) by mouth at bedtime.    Dispense:  90 tablet    Refill:  3   oxybutynin (DITROPAN-XL) 5 MG 24 hr tablet    Sig: Take 1 tablet (5 mg total) by mouth daily with supper.    Dispense:  90 tablet    Refill:  2   zolpidem (AMBIEN) 5 MG tablet    Sig: Take 1 tablet (5 mg total) by mouth at bedtime as needed for sleep.    Dispense:  15 tablet    Refill:  1    This patient was seen by Drema Dallas, PA-C in collaboration with Dr. Clayborn Bigness as a part of collaborative care agreement.  Total time spent:35 Minutes  Time spent includes review of chart, medications, test results, and follow up plan with the patient.     Lavera Guise, MD  Internal Medicine

## 2021-09-03 NOTE — Telephone Encounter (Signed)
CALLED PATIENT NO ANSWER LEFT VOICEMAIL FOR A CALL BACK ? ?

## 2021-09-04 ENCOUNTER — Telehealth: Payer: Self-pay

## 2021-09-04 LAB — UA/M W/RFLX CULTURE, ROUTINE
Bilirubin, UA: NEGATIVE
Ketones, UA: NEGATIVE
Leukocytes,UA: NEGATIVE
Nitrite, UA: NEGATIVE
Protein,UA: NEGATIVE
RBC, UA: NEGATIVE
Specific Gravity, UA: 1.018 (ref 1.005–1.030)
Urobilinogen, Ur: 1 mg/dL (ref 0.2–1.0)
pH, UA: 5.5 (ref 5.0–7.5)

## 2021-09-04 LAB — MICROSCOPIC EXAMINATION
Bacteria, UA: NONE SEEN
Casts: NONE SEEN /lpf
RBC, Urine: NONE SEEN /hpf (ref 0–2)
WBC, UA: NONE SEEN /hpf (ref 0–5)

## 2021-09-04 NOTE — Telephone Encounter (Signed)
Scheduled for 11/24/2021 °

## 2021-09-04 NOTE — Telephone Encounter (Signed)
Pt returned call

## 2021-09-11 ENCOUNTER — Other Ambulatory Visit
Admission: RE | Admit: 2021-09-11 | Discharge: 2021-09-11 | Disposition: A | Discharge status: Home / Self Care | Payer: Medicare Other | Attending: Physician Assistant | Admitting: Physician Assistant

## 2021-09-11 DIAGNOSIS — E782 Mixed hyperlipidemia: Secondary | ICD-10-CM | POA: Insufficient documentation

## 2021-09-11 DIAGNOSIS — E538 Deficiency of other specified B group vitamins: Secondary | ICD-10-CM | POA: Insufficient documentation

## 2021-09-11 DIAGNOSIS — E559 Vitamin D deficiency, unspecified: Secondary | ICD-10-CM | POA: Insufficient documentation

## 2021-09-11 DIAGNOSIS — R5383 Other fatigue: Secondary | ICD-10-CM | POA: Insufficient documentation

## 2021-09-11 DIAGNOSIS — R7989 Other specified abnormal findings of blood chemistry: Secondary | ICD-10-CM | POA: Insufficient documentation

## 2021-09-11 LAB — VITAMIN D 25 HYDROXY (VIT D DEFICIENCY, FRACTURES): Vit D, 25-Hydroxy: 17 ng/mL — ABNORMAL LOW (ref 30–100)

## 2021-09-11 LAB — CBC WITH DIFFERENTIAL/PLATELET
Abs Immature Granulocytes: 0.02 10*3/uL (ref 0.00–0.07)
Basophils Absolute: 0 10*3/uL (ref 0.0–0.1)
Basophils Relative: 0 %
Eosinophils Absolute: 0.1 10*3/uL (ref 0.0–0.5)
Eosinophils Relative: 1 %
HCT: 38 % (ref 36.0–46.0)
Hemoglobin: 11.9 g/dL — ABNORMAL LOW (ref 12.0–15.0)
Immature Granulocytes: 0 %
Lymphocytes Relative: 20 %
Lymphs Abs: 1.8 10*3/uL (ref 0.7–4.0)
MCH: 24.2 pg — ABNORMAL LOW (ref 26.0–34.0)
MCHC: 31.3 g/dL (ref 30.0–36.0)
MCV: 77.4 fL — ABNORMAL LOW (ref 80.0–100.0)
Monocytes Absolute: 0.4 10*3/uL (ref 0.1–1.0)
Monocytes Relative: 5 %
Neutro Abs: 6.7 10*3/uL (ref 1.7–7.7)
Neutrophils Relative %: 74 %
Platelets: 163 10*3/uL (ref 150–400)
RBC: 4.91 MIL/uL (ref 3.87–5.11)
RDW: 18.3 % — ABNORMAL HIGH (ref 11.5–15.5)
WBC: 9 10*3/uL (ref 4.0–10.5)
nRBC: 0 % (ref 0.0–0.2)

## 2021-09-11 LAB — COMPREHENSIVE METABOLIC PANEL
ALT: 15 U/L (ref 0–44)
AST: 15 U/L (ref 15–41)
Albumin: 3.5 g/dL (ref 3.5–5.0)
Alkaline Phosphatase: 60 U/L (ref 38–126)
Anion gap: 8 (ref 5–15)
BUN: 15 mg/dL (ref 8–23)
CO2: 29 mmol/L (ref 22–32)
Calcium: 9.5 mg/dL (ref 8.9–10.3)
Chloride: 101 mmol/L (ref 98–111)
Creatinine, Ser: 0.63 mg/dL (ref 0.44–1.00)
GFR, Estimated: >60 mL/min (ref >60)
Glucose, Bld: 111 mg/dL — ABNORMAL HIGH (ref 70–99)
Potassium: 4.2 mmol/L (ref 3.5–5.1)
Sodium: 138 mmol/L (ref 135–145)
Total Bilirubin: 0.5 mg/dL (ref 0.3–1.2)
Total Protein: 8 g/dL (ref 6.5–8.1)

## 2021-09-11 LAB — TSH: TSH: 4.596 u[IU]/mL — ABNORMAL HIGH (ref 0.350–4.500)

## 2021-09-11 LAB — LIPID PANEL
Cholesterol: 243 mg/dL — ABNORMAL HIGH (ref 0–200)
HDL: 50 mg/dL (ref >40)
LDL Cholesterol: 181 mg/dL — ABNORMAL HIGH (ref 0–99)
Total CHOL/HDL Ratio: 4.9 RATIO
Triglycerides: 62 mg/dL (ref <150)
VLDL: 12 mg/dL (ref 0–40)

## 2021-09-11 LAB — VITAMIN B12: Vitamin B-12: 186 pg/mL (ref 180–914)

## 2021-09-11 LAB — T4, FREE: Free T4: 0.95 ng/dL (ref 0.61–1.12)

## 2021-09-11 LAB — FOLATE: Folate: 19.2 ng/mL (ref >5.9)

## 2021-09-15 ENCOUNTER — Telehealth: Payer: Self-pay

## 2021-09-15 MED ORDER — ERGOCALCIFEROL 1.25 MG (50000 UT) PO CAPS: 50000.0000 [IU] | ORAL_CAPSULE | ORAL | 3 refills | Status: DC

## 2021-09-15 NOTE — Telephone Encounter (Signed)
-----   Message from Mylinda Latina, PA-C sent at 09/15/2021 12:48 PM EDT ----- ?Please let her know her vit D is low and send drisdol. Cholesterol is also very elevated likely from running out of medication--make sure she takes lipitor daily as prescribed and work on diet. She also continues to have borderline low hemoglobin though this has been improving and will monitor. She also has very low b12 and recommend she schedule for B12 shots weeklyx3 then monthly ?

## 2021-09-15 NOTE — Telephone Encounter (Signed)
Called pt and informed her about lab results.  Informed pt that her Vit D was low and I sent in Drisdol to pharmacy, also informed pt that her cholesterol was very elevated and to make sure she takes her medication.  Advised that her b12 was low and Lauren would like for her to schedule once a week b12 injections for 3 weeks then go to once a month injections, pt advised she will call back and get appt scheduled.  Also her hemoglobin was borderline low and will monitor. ?

## 2021-09-16 ENCOUNTER — Telehealth: Payer: Self-pay

## 2021-09-16 NOTE — Telephone Encounter (Signed)
Patient called and left a voicemail on the scheduling line requesting an appointment for a b12 injection. Tried calling patient back to schedule an appointment. There was no answer and voicemail was full. ?

## 2021-09-22 ENCOUNTER — Ambulatory Visit: Payer: Medicare (Managed Care)

## 2021-09-29 ENCOUNTER — Other Ambulatory Visit: Payer: Self-pay

## 2021-09-29 ENCOUNTER — Ambulatory Visit (INDEPENDENT_AMBULATORY_CARE_PROVIDER_SITE_OTHER): Payer: Medicare (Managed Care)

## 2021-09-29 VITALS — BP 135/83 | HR 72 | Temp 98.4°F | Resp 16 | Ht 67.0 in | Wt 233.0 lb

## 2021-09-29 DIAGNOSIS — E538 Deficiency of other specified B group vitamins: Secondary | ICD-10-CM

## 2021-09-29 MED ORDER — CYANOCOBALAMIN 1000 MCG/ML IJ SOLN
1000.0000 ug | Freq: Once | INTRAMUSCULAR | Status: AC
  Administered 2021-09-29: 1000 ug via INTRAMUSCULAR

## 2021-10-01 DIAGNOSIS — M25521 Pain in right elbow: Secondary | ICD-10-CM | POA: Insufficient documentation

## 2021-10-02 NOTE — Progress Notes (Deleted)
error 

## 2021-10-06 ENCOUNTER — Ambulatory Visit: Payer: Medicare (Managed Care)

## 2021-10-06 NOTE — Progress Notes (Signed)
error 

## 2021-10-13 ENCOUNTER — Ambulatory Visit (INDEPENDENT_AMBULATORY_CARE_PROVIDER_SITE_OTHER): Payer: Medicare (Managed Care)

## 2021-10-13 DIAGNOSIS — E538 Deficiency of other specified B group vitamins: Secondary | ICD-10-CM | POA: Diagnosis not present

## 2021-10-13 MED ORDER — CYANOCOBALAMIN 1000 MCG/ML IJ SOLN
1000.0000 ug | Freq: Once | INTRAMUSCULAR | Status: AC
  Administered 2021-10-13: 1000 ug via INTRAMUSCULAR

## 2021-10-16 DIAGNOSIS — M25611 Stiffness of right shoulder, not elsewhere classified: Secondary | ICD-10-CM | POA: Insufficient documentation

## 2021-10-20 ENCOUNTER — Ambulatory Visit: Payer: Medicare (Managed Care)

## 2021-10-21 ENCOUNTER — Ambulatory Visit (INDEPENDENT_AMBULATORY_CARE_PROVIDER_SITE_OTHER): Payer: Medicare (Managed Care)

## 2021-10-21 ENCOUNTER — Telehealth: Payer: Self-pay

## 2021-10-21 DIAGNOSIS — E538 Deficiency of other specified B group vitamins: Secondary | ICD-10-CM | POA: Diagnosis not present

## 2021-10-21 MED ORDER — CYANOCOBALAMIN 1000 MCG/ML IJ SOLN
1000.0000 ug | Freq: Once | INTRAMUSCULAR | Status: AC
  Administered 2021-10-21: 1000 ug via INTRAMUSCULAR

## 2021-10-21 NOTE — Telephone Encounter (Signed)
Pt came in for a B12 injection appointment and was complaining of back pain from a recent fall. Advised patient to take Tylenol and we scheduled an acute visit for her on 10/22/2021. ?

## 2021-10-22 ENCOUNTER — Ambulatory Visit (INDEPENDENT_AMBULATORY_CARE_PROVIDER_SITE_OTHER): Payer: Medicare (Managed Care) | Admitting: Nurse Practitioner

## 2021-10-22 ENCOUNTER — Encounter: Payer: Self-pay | Admitting: Nurse Practitioner

## 2021-10-22 VITALS — BP 120/74 | HR 79 | Temp 98.8°F | Resp 16 | Ht 67.0 in | Wt 234.8 lb

## 2021-10-22 DIAGNOSIS — M545 Low back pain, unspecified: Secondary | ICD-10-CM | POA: Diagnosis not present

## 2021-10-22 MED ORDER — OXYCODONE-ACETAMINOPHEN 5-325 MG PO TABS: 1.0000 | ORAL_TABLET | ORAL | 0 refills | Status: AC | PRN

## 2021-10-22 MED ORDER — CYCLOBENZAPRINE HCL 10 MG PO TABS: 10.0000 mg | ORAL_TABLET | Freq: Every day | ORAL | 1 refills | Status: DC

## 2021-10-22 NOTE — Progress Notes (Signed)
[facility] [place], [place] [address]  Internal MEDICINE  Office Visit Note  Patient Name: Felicia Woods  [id number]  [id number]  Date of Service: 10/22/2021  Chief Complaint  Patient presents with   Acute Visit   Back Pain    X 2 days, pt fell off of bed     HPI Felicia Woods presents for an acute sick visit for acute back pain. She fell off her bed 2 days ago and hit her back. She could not get an appointment with her orthopedic specialist until next week. She is requesting medication for pain and inflammation to make it until her appt with orthopedic next week.  She describes the pain as achy and sharp, with a constant dull ache. Pain is 9.5 out of 10.   Need PA renewal for invokana  Current Medication:  Outpatient Encounter Medications as of 10/22/2021  Medication Sig   Accu-Chek Softclix Lancets lancets Use as instructed twice a day to check blood sugars  DIAG -E11.59   albuterol (PROVENTIL HFA;VENTOLIN HFA) 108 (90 Base) MCG/ACT inhaler Inhale 2 puffs into the lungs every 6 (six) hours as needed for wheezing or shortness of breath.   amLODipine (NORVASC) 10 MG tablet Take 1 tablet (10 mg total) by mouth daily.   aspirin EC 81 MG tablet Take 81 mg by mouth daily.   atorvastatin (LIPITOR) 80 MG tablet Take 1 tablet (80 mg total) by mouth daily.   canagliflozin (INVOKANA) 300 MG TABS tablet Take 1 tablet (300 mg total) by mouth daily before breakfast.   clopidogrel (PLAVIX) 75 MG tablet Take 1 tablet (75 mg total) by mouth daily.   Dulaglutide (TRULICITY) 1.5 JZ/7.9XT SOPN INJECT 1.5MG INTO THE SKIN ONCE A WEEK   ergocalciferol (VITAMIN D2) 1.25 MG (50000 UT) capsule Take 1 capsule (50,000 Units total) by mouth once a week.   famotidine (PEPCID) 20 MG tablet Take 1 tablet (20 mg total) by mouth daily. Home med   furosemide (LASIX) 40 MG tablet Take 1 tablet (40 mg total) by mouth 2 (two) times daily.   glucose blood (ACCU-CHEK GUIDE) test strip Use as  instructed to check blood sugars twice a day  E11.59   losartan (COZAAR) 100 MG tablet Take 1 tablet (100 mg total) by mouth daily. Take one tab po qd   montelukast (SINGULAIR) 10 MG tablet Take 1 tablet (10 mg total) by mouth at bedtime.   nitroGLYCERIN (NITROSTAT) 0.4 MG SL tablet DISSOLVE ONE TABLET UNDER THE TONGUE EVERY 5 MINUTES AS NEEDED FOR CHEST PAIN.  DO NOT EXCEED A TOTAL OF 3 DOSES IN 15 MINUTES   oxybutynin (DITROPAN-XL) 5 MG 24 hr tablet Take 1 tablet (5 mg total) by mouth daily with supper.   oxyCODONE-acetaminophen (PERCOCET/ROXICET) 5-325 MG tablet Take 1 tablet by mouth every 4 (four) hours as needed for up to 5 days for severe pain.   zolpidem (AMBIEN) 5 MG tablet Take 1 tablet (5 mg total) by mouth at bedtime as needed for sleep.   [DISCONTINUED] cyclobenzaprine (FLEXERIL) 10 MG tablet Take 1 tablet (10 mg total) by mouth at bedtime.   cyclobenzaprine (FLEXERIL) 10 MG tablet Take 1 tablet (10 mg total) by mouth at bedtime.   isosorbide mononitrate (IMDUR) 60 MG 24 hr tablet Take 1 tablet (60 mg total) by mouth daily.   No facility-administered encounter medications on file as of 10/22/2021.      Medical History: Past Medical History:  Diagnosis Date   Asthma  Coronary artery disease    Diabetes mellitus without complication (HCC)    Heart attack ([place])    Hyperlipidemia    Hypertension    MI (myocardial infarction) ([place])    Migraine headache with aura    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     Vital Signs: BP 120/74   Pulse 79   Temp 98.8 F (37.1 C)   Resp 16   Ht _0  (1.702 m)   Wt 234 lb 12.8 oz (106.5 kg)   SpO2 98%   BMI 36.77 kg/m    Review of Systems  Constitutional:  Negative for chills, fatigue and unexpected weight change.  HENT:  Negative for congestion, rhinorrhea, sneezing and sore throat.   Eyes:  Negative for redness.  Respiratory:  Negative for cough, chest tightness and shortness of breath.   Cardiovascular: Negative.  Negative  for chest pain and palpitations.  Gastrointestinal:  Negative for abdominal pain, constipation, diarrhea, nausea and vomiting.  Genitourinary:  Negative for dysuria and frequency.  Musculoskeletal:  Positive for arthralgias and back pain. Negative for joint swelling and neck pain.  Skin:  Negative for rash.  Neurological: Negative.  Negative for tremors and numbness.  Hematological:  Negative for adenopathy. Does not bruise/bleed easily.  Psychiatric/Behavioral:  Negative for behavioral problems (Depression), sleep disturbance and suicidal ideas. The patient is not nervous/anxious.    Physical Exam Vitals reviewed.  Constitutional:      General: She is not in acute distress.    Appearance: Normal appearance. She is obese. She is not ill-appearing.  HENT:     Head: Normocephalic and atraumatic.  Eyes:     Pupils: Pupils are equal, round, and reactive to light.  Cardiovascular:     Rate and Rhythm: Normal rate and regular rhythm.  Pulmonary:     Effort: Pulmonary effort is normal. No respiratory distress.  Musculoskeletal:     Lumbar back: Spasms and tenderness present. Decreased range of motion.  Neurological:     Mental Status: She is alert and oriented to person, place, and time.  Psychiatric:        Mood and Affect: Mood normal.        Behavior: Behavior normal.      Assessment/Plan: 1. Acute midline low back pain without sciatica A prescription for cyclobenzaprine and percocet was sent to the patient's pharmacy to help alleviate pain and muscle spasms until she goes to her orthopedic specialist next week.  - cyclobenzaprine (FLEXERIL) 10 MG tablet; Take 1 tablet (10 mg total) by mouth at bedtime.  Dispense: 30 tablet; Refill: 1 - oxyCODONE-acetaminophen (PERCOCET/ROXICET) 5-325 MG tablet; Take 1 tablet by mouth every 4 (four) hours as needed for up to 5 days for severe pain.  Dispense: 30 tablet; Refill: 0   General Counseling: Felicia Woods verbalizes understanding of the  findings of todays visit and agrees with plan of treatment. I have discussed any further diagnostic evaluation that may be needed or ordered today. We also reviewed her medications today. she has been encouraged to call the office with any questions or concerns that should arise related to todays visit.    Counseling:    No orders of the defined types were placed in this encounter.   Meds ordered this encounter  Medications   cyclobenzaprine (FLEXERIL) 10 MG tablet    Sig: Take 1 tablet (10 mg total) by mouth at bedtime.    Dispense:  30 tablet    Refill:  1   oxyCODONE-acetaminophen (PERCOCET/ROXICET)  5-325 MG tablet    Sig: Take 1 tablet by mouth every 4 (four) hours as needed for up to 5 days for severe pain.    Dispense:  30 tablet    Refill:  0    Return if symptoms worsen or fail to improve.  [place] Controlled Substance Database was reviewed by me for overdose risk score (ORS)  Time spent:20 Minutes Time spent with patient included reviewing progress notes, labs, imaging studies, and discussing plan for follow up.   This patient was seen by Jonetta Osgood, FNP-C in collaboration with Dr. Clayborn Bigness as a part of collaborative care agreement.  Remell Giaimo R. Valetta Fuller, MSN, FNP-C Internal Medicine

## 2021-11-10 ENCOUNTER — Ambulatory Visit: Payer: Medicare (Managed Care)

## 2021-11-11 ENCOUNTER — Ambulatory Visit (INDEPENDENT_AMBULATORY_CARE_PROVIDER_SITE_OTHER): Payer: Medicare (Managed Care)

## 2021-11-11 DIAGNOSIS — E538 Deficiency of other specified B group vitamins: Secondary | ICD-10-CM | POA: Diagnosis not present

## 2021-11-11 MED ORDER — CYANOCOBALAMIN 1000 MCG/ML IJ SOLN
1000.0000 ug | Freq: Once | INTRAMUSCULAR | Status: AC
  Administered 2021-11-11: 1000 ug via INTRAMUSCULAR

## 2021-11-13 ENCOUNTER — Telehealth: Payer: Self-pay

## 2021-11-13 NOTE — Telephone Encounter (Signed)
Completed medical records for [employer] [place] ?Mailed to [address]  ?[place] [address] ? ?

## 2021-11-15 ENCOUNTER — Other Ambulatory Visit: Payer: Self-pay | Admitting: Physician Assistant

## 2021-11-15 DIAGNOSIS — K219 Gastro-esophageal reflux disease without esophagitis: Secondary | ICD-10-CM

## 2021-11-22 ENCOUNTER — Encounter: Payer: Self-pay | Admitting: Nurse Practitioner

## 2021-11-24 ENCOUNTER — Ambulatory Visit (INDEPENDENT_AMBULATORY_CARE_PROVIDER_SITE_OTHER): Payer: Medicare Other | Admitting: Gastroenterology

## 2021-11-24 ENCOUNTER — Other Ambulatory Visit: Payer: Self-pay

## 2021-11-24 ENCOUNTER — Encounter: Payer: Self-pay | Admitting: Gastroenterology

## 2021-11-24 ENCOUNTER — Ambulatory Visit: Payer: Medicare (Managed Care)

## 2021-11-24 VITALS — BP 130/82 | HR 82 | Temp 98.1°F | Ht 67.0 in | Wt 235.8 lb

## 2021-11-24 DIAGNOSIS — R159 Full incontinence of feces: Secondary | ICD-10-CM

## 2021-11-24 DIAGNOSIS — R634 Abnormal weight loss: Secondary | ICD-10-CM

## 2021-11-24 DIAGNOSIS — D509 Iron deficiency anemia, unspecified: Secondary | ICD-10-CM | POA: Diagnosis not present

## 2021-11-24 DIAGNOSIS — E538 Deficiency of other specified B group vitamins: Secondary | ICD-10-CM | POA: Diagnosis not present

## 2021-11-24 MED ORDER — PEG 3350-KCL-NA BICARB-NACL 420 G PO SOLR: ORAL | 0 refills | Status: DC

## 2021-11-24 NOTE — Progress Notes (Signed)
Jonathon Bellows MD, MRCP(U.K) [address]  [place], [place] [address]  Main: [phone]  Fax: [phone]   Gastroenterology Consultation  Referring Provider:     Mylinda Latina, PA* Primary Care Physician:  Mylinda Latina, PA-C Primary Gastroenterologist:  Dr. Jonathon Bellows  Reason for Consultation:     Fecal incontinence        HPI:   Felicia Woods is a 66 y.o. y/o female furred for fecal incontinence  Previously in 2021 was scheduled for a colonoscopy which she cancelled. Low b12 - 186, TSH elevated , Hb 11.9 , MCV 77  She says its been a few months since she has noticed that she leaks stool often before she can reach the restroom or when she coughs along with urine as well.  She has had 3 children by vaginal deliveries first when she was in prolonged labor.  Lost about 10 pounds of weight unintentionally recently no upper abdominal discomfort no change in bowel habits.  No blood in the stool.  Denies any bulging sensation per vagina.  Denies any nosebleeds blood in the urine or any hematemesis.  Past Medical History:  Diagnosis Date   Asthma    Coronary artery disease    Diabetes mellitus without complication ([place])    Heart attack ([place])    Hyperlipidemia    Hypertension    MI (myocardial infarction) ([place])    Migraine headache with aura    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    Past Surgical History:  Procedure Laterality Date   ABDOMINAL HYSTERECTOMY     CARDIAC CATHETERIZATION     CARDIAC CATHETERIZATION N/A 10/18/2015   Procedure: Left Heart Cath and Cors/Grafts Angiography;  Surgeon: Lorretta Harp, MD;  Location: [place] [facility];  Service: Cardiovascular;  Laterality: N/A;   CARDIAC CATHETERIZATION N/A 10/18/2015   Procedure: Coronary Stent Intervention;  Surgeon: Lorretta Harp, MD;  Location: [place] [facility];  Service: Cardiovascular;  Laterality: N/A;   CARDIAC SURGERY     CHOLECYSTECTOMY     CORONARY ANGIOPLASTY     CORONARY  ARTERY BYPASS GRAFT     4 vessels - 2010   CORONARY STENT INTERVENTION N/A 02/16/2017   Procedure: CORONARY STENT INTERVENTION;  Surgeon: Yolonda Kida, MD;  Location: [place] [facility];  Service: Cardiovascular;  Laterality: N/A;   CORONARY STENT INTERVENTION N/A 02/13/2019   Procedure: CORONARY STENT INTERVENTION;  Surgeon: Nelva Bush, MD;  Location: [facility];  Service: Cardiovascular;  Laterality: N/A;  SVG to RCA   CORONARY STENT INTERVENTION Left 03/08/2019   Procedure: CORONARY STENT INTERVENTION;  Surgeon: Yolonda Kida, MD;  Location: [facility];  Service: Cardiovascular;  Laterality: Left;   CORONARY STENT INTERVENTION N/A 11/04/2020   Procedure: CORONARY STENT INTERVENTION;  Surgeon: Nelva Bush, MD;  Location: [facility];  Service: Cardiovascular;  Laterality: N/A;   LEFT HEART CATH AND CORONARY ANGIOGRAPHY Left 02/16/2017   Procedure: LEFT HEART CATH AND CORONARY ANGIOGRAPHY;  Surgeon: Corey Skains, MD;  Location: [place] [facility];  Service: Cardiovascular;  Laterality: Left;   LEFT HEART CATH AND CORS/GRAFTS ANGIOGRAPHY Left 11/23/2017   Procedure: LEFT HEART CATH AND CORS/GRAFTS ANGIOGRAPHY;  Surgeon: Corey Skains, MD;  Location: [facility];  Service: Cardiovascular;  Laterality: Left;   LEFT HEART CATH AND CORS/GRAFTS ANGIOGRAPHY N/A 02/13/2019   Procedure: LEFT HEART CATH AND CORS/GRAFTS ANGIOGRAPHY;  Surgeon: Serafina Royals  J, MD;  Location: [place] [facility];  Service: Cardiovascular;  Laterality: N/A;   LEFT HEART CATH AND CORS/GRAFTS ANGIOGRAPHY N/A 07/03/2019   Procedure: LEFT HEART CATH AND CORS/GRAFTS ANGIOGRAPHY;  Surgeon: Corey Skains, MD;  Location: [facility];  Service: Cardiovascular;  Laterality: N/A;   LEFT HEART CATH AND CORS/GRAFTS ANGIOGRAPHY N/A 11/04/2020   Procedure: LEFT HEART CATH AND CORS/GRAFTS ANGIOGRAPHY;  Surgeon: Corey Skains, MD;  Location: [facility];   Service: Cardiovascular;  Laterality: N/A;    Prior to Admission medications   Medication Sig Start Date End Date Taking? Authorizing Provider  Accu-Chek Softclix Lancets lancets Use as instructed twice a day to check blood sugars  DIAG -E11.59 08/20/20   Luiz Ochoa, NP  albuterol (PROVENTIL HFA;VENTOLIN HFA) 108 (90 Base) MCG/ACT inhaler Inhale 2 puffs into the lungs every 6 (six) hours as needed for wheezing or shortness of breath.    [provider]  amLODipine (NORVASC) 10 MG tablet Take 1 tablet (10 mg total) by mouth daily. 09/03/21   McDonough, Si Gaul, PA-C  aspirin EC 81 MG tablet Take 81 mg by mouth daily.    [provider]  atorvastatin (LIPITOR) 80 MG tablet Take 1 tablet (80 mg total) by mouth daily. 09/03/21 09/03/22  McDonough, Si Gaul, PA-C  canagliflozin (INVOKANA) 300 MG TABS tablet Take 1 tablet (300 mg total) by mouth daily before breakfast. 09/03/21   McDonough, Si Gaul, PA-C  clopidogrel (PLAVIX) 75 MG tablet Take 1 tablet (75 mg total) by mouth daily. 09/03/21   McDonough, Si Gaul, PA-C  cyclobenzaprine (FLEXERIL) 10 MG tablet Take 1 tablet (10 mg total) by mouth at bedtime. 10/22/21   Jonetta Osgood, NP  Dulaglutide (TRULICITY) 1.5 KV/4.2VZ SOPN INJECT 1.5MG INTO THE SKIN ONCE A WEEK 09/03/21   McDonough, Lauren K, PA-C  ergocalciferol (VITAMIN D2) 1.25 MG (50000 UT) capsule Take 1 capsule (50,000 Units total) by mouth once a week. 09/15/21   McDonough, Si Gaul, PA-C  famotidine (PEPCID) 20 MG tablet Take 1 tablet by mouth in the morning and at bedtime. 08/24/21   [provider]  furosemide (LASIX) 40 MG tablet Take 1 tablet (40 mg total) by mouth 2 (two) times daily. 09/03/21   McDonough, Si Gaul, PA-C  glucose blood (ACCU-CHEK GUIDE) test strip Use as instructed to check blood sugars twice a day  E11.59 08/20/20   Luiz Ochoa, NP  isosorbide mononitrate (IMDUR) 60 MG 24 hr tablet Take 1 tablet (60 mg total) by mouth daily. 06/10/21 09/08/21   Enzo Bi, MD  losartan (COZAAR) 100 MG tablet Take 1 tablet (100 mg total) by mouth daily. Take one tab po qd 09/03/21   McDonough, Si Gaul, PA-C  methocarbamol (ROBAXIN) 500 MG tablet Take 1 tablet by mouth as needed.    [provider]  metoprolol succinate (TOPROL-XL) 25 MG 24 hr tablet Take 25 mg by mouth daily. 08/24/21   [provider]  montelukast (SINGULAIR) 10 MG tablet Take 1 tablet (10 mg total) by mouth at bedtime. 09/03/21   McDonough, Si Gaul, PA-C  nitroGLYCERIN (NITROSTAT) 0.4 MG SL tablet DISSOLVE ONE TABLET UNDER THE TONGUE EVERY 5 MINUTES AS NEEDED FOR CHEST PAIN.  DO NOT EXCEED A TOTAL OF 3 DOSES IN 15 MINUTES 04/12/21   Lavera Guise, MD  oxybutynin (DITROPAN-XL) 5 MG 24 hr tablet Take 1 tablet by mouth at bedtime. 09/03/21   [provider]  ranolazine (RANEXA) 500 MG  12 hr tablet Take 1 tablet by mouth 2 (two) times daily. 07/07/21 07/07/22  [provider]  sertraline (ZOLOFT) 100 MG tablet Take 100 mg by mouth daily. 06/13/21   [provider]  zolpidem (AMBIEN) 5 MG tablet Take 1 tablet (5 mg total) by mouth at bedtime as needed for sleep. 09/03/21   McDonough, Si Gaul, PA-C    Family History  Problem Relation Age of Onset   Diabetes Mother    Diabetes Father    Cancer Father    Diabetes Brother      Social History   Tobacco Use   Smoking status: Never   Smokeless tobacco: Never  Vaping Use   Vaping Use: Never used  Substance Use Topics   Alcohol use: No    Alcohol/week: 0.0 standard drinks   Drug use: No    Allergies as of 11/24/2021 - Review Complete 11/24/2021  Allergen Reaction Noted   Penicillin g Anaphylaxis and Other (See Comments) 02/10/2015    Review of Systems:    All systems reviewed and negative except where noted in HPI.   Physical Exam:  BP 130/82 (BP Location: Right Arm, Patient Position: Sitting)   Pulse 82   Temp 98.1 F (36.7 C) (Oral)   Ht _0  (1.702 m)   Wt 235 lb 12.8 oz (107 kg)   BMI  36.93 kg/m  No LMP recorded. Patient has had a hysterectomy. Psych:  Alert and cooperative. Normal mood and affect. General:   Alert,  Well-developed, well-nourished, pleasant and cooperative in NAD Head:  Normocephalic and atraumatic. Eyes:  Sclera clear, no icterus.   Conjunctiva pink. Ears:  Normal auditory acuity.    Neurologic:  Alert and oriented x3;  grossly normal neurologically. Psych:  Alert and cooperative. Normal mood and affect.  Imaging Studies: No results found.  Assessment and Plan:   Felicia Woods is a 66 y.o. y/o female has been referred for fecal incontinence, Review of labs suggest very low b12 levels, new onset microcytic anemia. Never had a colonoscopy .   Plan  B12 deficiency -continue supplementation, check folic acid Microcytic anemia- check iron studies- EGD+colonoscopy , celiac serology, h pylori breath test , urine analysis PRN imodium, fiber supplement Await MRI spine which has been ordered  She has lost about 10 pounds of weight.  We will follow-up at her next visit if she continues to lose weight may need CT scan of the chest abdomen pelvis If evaluation is negative may require evaluation by GYN for bladder prolapse as she has a history of urine as well as fecal incontinence, history of prolonged childbirth.  I will perform up rectal exam on the day of her endoscopic evaluation.  I have discussed alternative options, risks & benefits,  which include, but are not limited to, bleeding, infection, perforation,respiratory complication & drug reaction.  The patient agrees with this plan & written consent will be obtained.     Follow up in 8 weeks   Dr Jonathon Bellows MD,MRCP(U.K)

## 2021-11-24 NOTE — Patient Instructions (Signed)

## 2021-11-26 LAB — URINALYSIS
Bilirubin, UA: NEGATIVE
Ketones, UA: NEGATIVE
Nitrite, UA: NEGATIVE
RBC, UA: NEGATIVE
Specific Gravity, UA: 1.025 (ref 1.005–1.030)
Urobilinogen, Ur: 0.2 mg/dL (ref 0.2–1.0)
pH, UA: 5.5 (ref 5.0–7.5)

## 2021-11-26 LAB — H. PYLORI BREATH TEST: H pylori Breath Test: POSITIVE — AB

## 2021-11-27 ENCOUNTER — Other Ambulatory Visit
Admission: RE | Admit: 2021-11-27 | Discharge: 2021-11-27 | Disposition: A | Discharge status: Home / Self Care | Payer: Medicare Other | Attending: Gastroenterology | Admitting: Gastroenterology

## 2021-11-27 ENCOUNTER — Telehealth: Payer: Self-pay

## 2021-11-27 DIAGNOSIS — Z8619 Personal history of other infectious and parasitic diseases: Secondary | ICD-10-CM

## 2021-11-27 DIAGNOSIS — E538 Deficiency of other specified B group vitamins: Secondary | ICD-10-CM | POA: Insufficient documentation

## 2021-11-27 DIAGNOSIS — D509 Iron deficiency anemia, unspecified: Secondary | ICD-10-CM | POA: Diagnosis not present

## 2021-11-27 DIAGNOSIS — R159 Full incontinence of feces: Secondary | ICD-10-CM | POA: Insufficient documentation

## 2021-11-27 LAB — IRON AND TIBC
Iron: 32 ug/dL (ref 28–170)
Saturation Ratios: 10 % — ABNORMAL LOW (ref 10.4–31.8)
TIBC: 309 ug/dL (ref 250–450)
UIBC: 277 ug/dL

## 2021-11-27 LAB — FERRITIN: Ferritin: 69 ng/mL (ref 11–307)

## 2021-11-27 MED ORDER — OMEPRAZOLE 20 MG PO CPDR: 20.0000 mg | DELAYED_RELEASE_CAPSULE | Freq: Two times a day (BID) | ORAL | 0 refills | Status: DC

## 2021-11-27 MED ORDER — TETRACYCLINE HCL 500 MG PO CAPS
500.0000 mg | ORAL_CAPSULE | Freq: Four times a day (QID) | ORAL | 0 refills | Status: AC
Start: 2021-11-27 — End: 2021-12-11

## 2021-11-27 MED ORDER — METRONIDAZOLE 250 MG PO TABS: 250.0000 mg | ORAL_TABLET | Freq: Four times a day (QID) | ORAL | 0 refills | Status: AC

## 2021-11-27 MED ORDER — BISMUTH SUBSALICYLATE 262 MG PO TABS
1.0000 | ORAL_TABLET | Freq: Four times a day (QID) | ORAL | 0 refills | Status: AC
Start: 2021-11-27 — End: 2021-12-11

## 2021-11-27 NOTE — Telephone Encounter (Signed)
Called patient to let her know the below information. Patient understood and had no further questions. Patient was also told to come back in 8 weeks to have an H Pylori breath test performed to make sure that she no longer had the bacteria. Patient understood.

## 2021-11-27 NOTE — Progress Notes (Signed)
H pylori positive- start on bismuth based quadruple therapy for 14 days - check H pylori breath test 6 weeks later

## 2021-11-27 NOTE — Telephone Encounter (Signed)
-----   Message from Jonathon Bellows, MD sent at 11/27/2021  8:24 AM EDT ----- H pylori positive- start on bismuth based quadruple therapy for 14 days - check H pylori breath test 6 weeks later

## 2021-11-28 DIAGNOSIS — R159 Full incontinence of feces: Secondary | ICD-10-CM | POA: Diagnosis not present

## 2021-12-01 ENCOUNTER — Ambulatory Visit: Payer: Medicare (Managed Care)

## 2021-12-02 ENCOUNTER — Encounter
Admission: RE | Disposition: A | Discharge status: Home / Self Care | Payer: Self-pay | Source: Home / Self Care | Attending: Gastroenterology

## 2021-12-02 ENCOUNTER — Ambulatory Visit: Payer: Medicare Other | Admitting: Certified Registered Nurse Anesthetist

## 2021-12-02 ENCOUNTER — Encounter: Payer: Self-pay | Admitting: Gastroenterology

## 2021-12-02 ENCOUNTER — Ambulatory Visit
Admission: RE | Admit: 2021-12-02 | Discharge: 2021-12-02 | Disposition: A | Discharge status: Home / Self Care | Payer: Medicare Other | Attending: Gastroenterology | Admitting: Gastroenterology

## 2021-12-02 DIAGNOSIS — R634 Abnormal weight loss: Secondary | ICD-10-CM

## 2021-12-02 DIAGNOSIS — I110 Hypertensive heart disease with heart failure: Secondary | ICD-10-CM | POA: Diagnosis not present

## 2021-12-02 DIAGNOSIS — Z951 Presence of aortocoronary bypass graft: Secondary | ICD-10-CM | POA: Diagnosis not present

## 2021-12-02 DIAGNOSIS — K635 Polyp of colon: Secondary | ICD-10-CM

## 2021-12-02 DIAGNOSIS — Z87891 Personal history of nicotine dependence: Secondary | ICD-10-CM | POA: Diagnosis not present

## 2021-12-02 DIAGNOSIS — K317 Polyp of stomach and duodenum: Secondary | ICD-10-CM

## 2021-12-02 DIAGNOSIS — E785 Hyperlipidemia, unspecified: Secondary | ICD-10-CM | POA: Diagnosis not present

## 2021-12-02 DIAGNOSIS — I2510 Atherosclerotic heart disease of native coronary artery without angina pectoris: Secondary | ICD-10-CM | POA: Insufficient documentation

## 2021-12-02 DIAGNOSIS — I252 Old myocardial infarction: Secondary | ICD-10-CM | POA: Diagnosis not present

## 2021-12-02 DIAGNOSIS — I509 Heart failure, unspecified: Secondary | ICD-10-CM | POA: Diagnosis not present

## 2021-12-02 DIAGNOSIS — K2950 Unspecified chronic gastritis without bleeding: Secondary | ICD-10-CM | POA: Insufficient documentation

## 2021-12-02 DIAGNOSIS — J45909 Unspecified asthma, uncomplicated: Secondary | ICD-10-CM | POA: Insufficient documentation

## 2021-12-02 DIAGNOSIS — R159 Full incontinence of feces: Secondary | ICD-10-CM

## 2021-12-02 DIAGNOSIS — E119 Type 2 diabetes mellitus without complications: Secondary | ICD-10-CM | POA: Diagnosis not present

## 2021-12-02 DIAGNOSIS — E538 Deficiency of other specified B group vitamins: Secondary | ICD-10-CM

## 2021-12-02 DIAGNOSIS — D509 Iron deficiency anemia, unspecified: Secondary | ICD-10-CM

## 2021-12-02 HISTORY — PX: ESOPHAGOGASTRODUODENOSCOPY: SHX5428

## 2021-12-02 HISTORY — PX: COLONOSCOPY WITH PROPOFOL: SHX5780

## 2021-12-02 LAB — GLUCOSE, CAPILLARY: Glucose-Capillary: 116 mg/dL — ABNORMAL HIGH (ref 70–99)

## 2021-12-02 SURGERY — COLONOSCOPY WITH PROPOFOL
Anesthesia: General

## 2021-12-02 MED ORDER — GLYCOPYRROLATE 0.2 MG/ML IJ SOLN
INTRAMUSCULAR | Status: DC | PRN
Start: 2021-12-02 — End: 2021-12-02
  Administered 2021-12-02: .2 mg via INTRAVENOUS

## 2021-12-02 MED ORDER — SODIUM CHLORIDE 0.9 % IV SOLN: INTRAVENOUS | Status: DC

## 2021-12-02 MED ORDER — GLYCOPYRROLATE 0.2 MG/ML IJ SOLN
INTRAMUSCULAR | Status: AC
  Filled 2021-12-02: qty 1

## 2021-12-02 MED ORDER — PROPOFOL 500 MG/50ML IV EMUL
INTRAVENOUS | Status: DC | PRN
  Administered 2021-12-02: 150 ug/kg/min via INTRAVENOUS

## 2021-12-02 MED ORDER — LIDOCAINE HCL (CARDIAC) PF 100 MG/5ML IV SOSY
PREFILLED_SYRINGE | INTRAVENOUS | Status: DC | PRN
  Administered 2021-12-02: 50 mg via INTRAVENOUS

## 2021-12-02 MED ORDER — LIDOCAINE HCL (PF) 2 % IJ SOLN
INTRAMUSCULAR | Status: AC
  Filled 2021-12-02: qty 5

## 2021-12-02 MED ORDER — PROPOFOL 10 MG/ML IV BOLUS
INTRAVENOUS | Status: DC | PRN
Start: 2021-12-02 — End: 2021-12-02
  Administered 2021-12-02: 60 mg via INTRAVENOUS

## 2021-12-02 MED ORDER — PROPOFOL 500 MG/50ML IV EMUL
INTRAVENOUS | Status: AC
  Filled 2021-12-02: qty 50

## 2021-12-02 NOTE — Op Note (Signed)
[facility] Patient Name: Felicia Woods Procedure Date: 12/02/2021 10:14 AM MRN: [id number] Account #: [id number] Date of Birth: 01-02-1956 Admit Type: Outpatient Age: 66 Room: [facility] ENDO ROOM 3 Gender: Female Note Status: Finalized Instrument Name: Jasper Riling [id number] Procedure:             Colonoscopy Indications:           Weight loss Providers:             Jonathon Bellows MD, MD Medicines:             Monitored Anesthesia Care Complications:         No immediate complications. Procedure:             Pre-Anesthesia Assessment:                        - Prior to the procedure, a History and Physical was                         performed, and patient medications, allergies and                         sensitivities were reviewed. The patient's tolerance                         of previous anesthesia was reviewed.                        - The risks and benefits of the procedure and the                         sedation options and risks were discussed with the                         patient. All questions were answered and informed                         consent was obtained.                        - ASA Grade Assessment: II - A patient with mild                         systemic disease.                        After obtaining informed consent, the colonoscope was                         passed under direct vision. Throughout the procedure,                         the patient's blood pressure, pulse, and oxygen                         saturations were monitored continuously. The                         Colonoscope was introduced through the anus and  advanced to the the cecum, identified by the                         appendiceal orifice. The patient tolerated the                         procedure well. The quality of the bowel preparation                         was fair. The colonoscopy was performed with moderate                          difficulty due to the patient's agitation. Findings:      poor anal tone      A 5 mm polyp was found in the transverse colon. The polyp was sessile.       The polyp was removed with a cold snare. Resection and retrieval were       complete.      The exam was otherwise without abnormality. Impression:            - Preparation of the colon was fair.                        - Abnormal digital rectal exam.                        - One 5 mm polyp in the transverse colon, removed with                         a cold snare. Resected and retrieved.                        - The examination was otherwise normal. Recommendation:        - Discharge patient to home (with escort).                        - Resume previous diet.                        - Continue present medications.                        - Await pathology results.                        - Repeat colonoscopy in 3 years for surveillance. Procedure Code(s):     --- Professional ---                        [phone], Colonoscopy, flexible; with removal of                         tumor(s), polyp(s), or other lesion(s) by snare                         technique Diagnosis Code(s):     --- Professional ---                        K62.89, Other specified diseases of anus and rectum  K63.5, Polyp of colon                        R63.4, Abnormal weight loss CPT copyright 2019 American Medical Association. All rights reserved. The codes documented in this report are preliminary and upon coder review may  be revised to meet current compliance requirements. Jonathon Bellows, MD Jonathon Bellows MD, MD 12/02/2021 10:59:11 AM This report has been signed electronically. Number of Addenda: 0 Note Initiated On: 12/02/2021 10:14 AM Scope Withdrawal Time: 0 hours 13 minutes 52 seconds  Total Procedure Duration: 0 hours 18 minutes 9 seconds  Estimated Blood Loss:  Estimated blood loss: none.      [facility]

## 2021-12-02 NOTE — Op Note (Signed)
[facility] Patient Name: Felicia Woods Procedure Date: 12/02/2021 10:14 AM MRN: [id number] Account #: [id number] Date of Birth: 08-03-1955 Admit Type: Outpatient Age: 66 Room: [facility] ENDO ROOM 3 Gender: Female Note Status: Finalized Instrument Name: Upper Endoscope [id number] Procedure:             Upper GI endoscopy Indications:           Weight loss Providers:             Jonathon Bellows MD, MD Medicines:             Monitored Anesthesia Care Complications:         No immediate complications. Procedure:             Pre-Anesthesia Assessment:                        - Prior to the procedure, a History and Physical was                         performed, and patient medications, allergies and                         sensitivities were reviewed. The patient's tolerance                         of previous anesthesia was reviewed.                        - The risks and benefits of the procedure and the                         sedation options and risks were discussed with the                         patient. All questions were answered and informed                         consent was obtained.                        - ASA Grade Assessment: II - A patient with mild                         systemic disease.                        After obtaining informed consent, the endoscope was                         passed under direct vision. Throughout the procedure,                         the patient's blood pressure, pulse, and oxygen                         saturations were monitored continuously. The                         Endosonoscope was introduced through the mouth, and  advanced to the third part of duodenum. The upper GI                         endoscopy was accomplished with ease. The patient                         tolerated the procedure well. Findings:      The esophagus was normal.      The examined duodenum was normal.      A  single 10 mm semi-sessile polyp with no bleeding and no stigmata of       recent bleeding was found on the greater curvature of the stomach. The       polyp was removed with a hot snare. Resection and retrieval were       complete.      The cardia and gastric fundus were normal on retroflexion. Impression:            - Normal esophagus.                        - Normal examined duodenum.                        - A single gastric polyp. Resected and retrieved. Recommendation:        - Await pathology results.                        - Perform a colonoscopy today. Procedure Code(s):     --- Professional ---                        [phone], Esophagogastroduodenoscopy, flexible,                         transoral; with removal of tumor(s), polyp(s), or                         other lesion(s) by snare technique Diagnosis Code(s):     --- Professional ---                        K31.7, Polyp of stomach and duodenum                        R63.4, Abnormal weight loss CPT copyright 2019 American Medical Association. All rights reserved. The codes documented in this report are preliminary and upon coder review may  be revised to meet current compliance requirements. Jonathon Bellows, MD Jonathon Bellows MD, MD 12/02/2021 10:33:42 AM This report has been signed electronically. Number of Addenda: 0 Note Initiated On: 12/02/2021 10:14 AM Estimated Blood Loss:  Estimated blood loss: none.      [facility]

## 2021-12-02 NOTE — Anesthesia Procedure Notes (Signed)
Procedure Name: MAC Date/Time: 12/02/2021 10:17 AM Performed by: Tollie Eth, CRNA Pre-anesthesia Checklist: Patient identified, Emergency Drugs available, Suction available and Patient being monitored Patient Re-evaluated:Patient Re-evaluated prior to induction Oxygen Delivery Method: Simple face mask Induction Type: IV induction Placement Confirmation: positive ETCO2

## 2021-12-02 NOTE — H&P (Signed)
Jonathon Bellows, MD [address] [place], [place], [place], [address] [address] [place], [place], [place], [address] Phone: [phone]  Fax: [phone]  Primary Care Physician:  Mylinda Latina, PA-C   Pre-Procedure History & Physical: HPI:  Felicia Woods is a 66 y.o. female is here for an endoscopy and colonoscopy    Past Medical History:  Diagnosis Date   Asthma    Coronary artery disease    Diabetes mellitus without complication ([place])    Heart attack ([place])    Hyperlipidemia    Hypertension    MI (myocardial infarction) ([place])    Migraine headache with aura    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    Past Surgical History:  Procedure Laterality Date   ABDOMINAL HYSTERECTOMY     CARDIAC CATHETERIZATION     CARDIAC CATHETERIZATION N/A 10/18/2015   Procedure: Left Heart Cath and Cors/Grafts Angiography;  Surgeon: Lorretta Harp, MD;  Location: [place] [facility];  Service: Cardiovascular;  Laterality: N/A;   CARDIAC CATHETERIZATION N/A 10/18/2015   Procedure: Coronary Stent Intervention;  Surgeon: Lorretta Harp, MD;  Location: [place] [facility];  Service: Cardiovascular;  Laterality: N/A;   CARDIAC SURGERY     CHOLECYSTECTOMY     CORONARY ANGIOPLASTY     CORONARY ARTERY BYPASS GRAFT     4 vessels - 2010   CORONARY STENT INTERVENTION N/A 02/16/2017   Procedure: CORONARY STENT INTERVENTION;  Surgeon: Yolonda Kida, MD;  Location: [place] [facility];  Service: Cardiovascular;  Laterality: N/A;   CORONARY STENT INTERVENTION N/A 02/13/2019   Procedure: CORONARY STENT INTERVENTION;  Surgeon: Nelva Bush, MD;  Location: [place] [facility];  Service: Cardiovascular;  Laterality: N/A;  SVG to RCA   CORONARY STENT INTERVENTION Left 03/08/2019   Procedure: CORONARY STENT INTERVENTION;  Surgeon: Yolonda Kida, MD;  Location: [place] [facility];  Service: Cardiovascular;  Laterality: Left;   CORONARY STENT INTERVENTION N/A 11/04/2020   Procedure:  CORONARY STENT INTERVENTION;  Surgeon: Nelva Bush, MD;  Location: [place] [facility];  Service: Cardiovascular;  Laterality: N/A;   LEFT HEART CATH AND CORONARY ANGIOGRAPHY Left 02/16/2017   Procedure: LEFT HEART CATH AND CORONARY ANGIOGRAPHY;  Surgeon: Corey Skains, MD;  Location: [place] [facility];  Service: Cardiovascular;  Laterality: Left;   LEFT HEART CATH AND CORS/GRAFTS ANGIOGRAPHY Left 11/23/2017   Procedure: LEFT HEART CATH AND CORS/GRAFTS ANGIOGRAPHY;  Surgeon: Corey Skains, MD;  Location: [place] [facility];  Service: Cardiovascular;  Laterality: Left;   LEFT HEART CATH AND CORS/GRAFTS ANGIOGRAPHY N/A 02/13/2019   Procedure: LEFT HEART CATH AND CORS/GRAFTS ANGIOGRAPHY;  Surgeon: Corey Skains, MD;  Location: [place] [facility];  Service: Cardiovascular;  Laterality: N/A;   LEFT HEART CATH AND CORS/GRAFTS ANGIOGRAPHY N/A 07/03/2019   Procedure: LEFT HEART CATH AND CORS/GRAFTS ANGIOGRAPHY;  Surgeon: Corey Skains, MD;  Location: [place] [facility];  Service: Cardiovascular;  Laterality: N/A;   LEFT HEART CATH AND CORS/GRAFTS ANGIOGRAPHY N/A 11/04/2020   Procedure: LEFT HEART CATH AND CORS/GRAFTS ANGIOGRAPHY;  Surgeon: Corey Skains, MD;  Location: [place] [facility];  Service: Cardiovascular;  Laterality: N/A;    Prior to Admission medications   Medication Sig Start Date End Date Taking? Authorizing Provider  Accu-Chek Softclix Lancets lancets Use as instructed twice a day to check blood sugars  DIAG -E11.59 08/20/20   Luiz Ochoa, NP  albuterol (PROVENTIL HFA;VENTOLIN HFA) 108 (  90 Base) MCG/ACT inhaler Inhale 2 puffs into the lungs every 6 (six) hours as needed for wheezing or shortness of breath.    [provider]  amLODipine (NORVASC) 10 MG tablet Take 1 tablet (10 mg total) by mouth daily. 09/03/21   McDonough, Si Gaul, PA-C  aspirin EC 81 MG tablet Take 81 mg by mouth daily.    [provider]  atorvastatin (LIPITOR) 80 MG  tablet Take 1 tablet (80 mg total) by mouth daily. 09/03/21 09/03/22  McDonough, Si Gaul, PA-C  Bismuth Subsalicylate 169 MG TABS Take 1 tablet (262 mg total) by mouth in the morning, at noon, in the evening, and at bedtime for 14 days. 11/27/21 12/11/21  Jonathon Bellows, MD  canagliflozin [facility]) 300 MG TABS tablet Take 1 tablet (300 mg total) by mouth daily before breakfast. 09/03/21   McDonough, Si Gaul, PA-C  clopidogrel (PLAVIX) 75 MG tablet Take 1 tablet (75 mg total) by mouth daily. 09/03/21   McDonough, Si Gaul, PA-C  cyclobenzaprine (FLEXERIL) 10 MG tablet Take 1 tablet (10 mg total) by mouth at bedtime. 10/22/21   Jonetta Osgood, NP  Dulaglutide (TRULICITY) 1.5 CV/8.9FY SOPN INJECT 1.5MG INTO THE SKIN ONCE A WEEK 09/03/21   McDonough, Lauren K, PA-C  ergocalciferol (VITAMIN D2) 1.25 MG (50000 UT) capsule Take 1 capsule (50,000 Units total) by mouth once a week. Patient not taking: Reported on 11/24/2021 09/15/21   Mylinda Latina, PA-C  famotidine (PEPCID) 20 MG tablet Take 1 tablet by mouth in the morning and at bedtime. 08/24/21   [provider]  furosemide (LASIX) 40 MG tablet Take 1 tablet (40 mg total) by mouth 2 (two) times daily. 09/03/21   McDonough, Si Gaul, PA-C  glucose blood (ACCU-CHEK GUIDE) test strip Use as instructed to check blood sugars twice a day  E11.59 08/20/20   Luiz Ochoa, NP  isosorbide mononitrate (IMDUR) 60 MG 24 hr tablet Take 1 tablet (60 mg total) by mouth daily. 06/10/21 11/24/21  Enzo Bi, MD  losartan (COZAAR) 100 MG tablet Take 1 tablet (100 mg total) by mouth daily. Take one tab po qd 09/03/21   McDonough, Si Gaul, PA-C  methocarbamol (ROBAXIN) 500 MG tablet Take 1 tablet by mouth as needed.    [provider]  metoprolol succinate (TOPROL-XL) 25 MG 24 hr tablet Take 25 mg by mouth daily. 08/24/21   [provider]  metroNIDAZOLE (FLAGYL) 250 MG tablet Take 1 tablet (250 mg total) by mouth 4 (four) times daily for 14 days. 11/27/21  12/11/21  Jonathon Bellows, MD  montelukast (SINGULAIR) 10 MG tablet Take 1 tablet (10 mg total) by mouth at bedtime. 09/03/21   McDonough, Si Gaul, PA-C  nitroGLYCERIN (NITROSTAT) 0.4 MG SL tablet DISSOLVE ONE TABLET UNDER THE TONGUE EVERY 5 MINUTES AS NEEDED FOR CHEST PAIN.  DO NOT EXCEED A TOTAL OF 3 DOSES IN 15 MINUTES 04/12/21   Lavera Guise, MD  omeprazole (PRILOSEC) 20 MG capsule Take 1 capsule (20 mg total) by mouth 2 (two) times daily before a meal for 14 days. 11/27/21 12/11/21  Jonathon Bellows, MD  oxybutynin (DITROPAN-XL) 5 MG 24 hr tablet Take 1 tablet by mouth at bedtime. 09/03/21   [provider]  polyethylene glycol-electrolytes (NULYTELY) 420 g solution Prepare according to package instructions. Starting at 5:00 PM: Drink one 8 oz glass of mixture every 15 minutes until you finish half of the jug. Five hours prior to procedure, drink 8 oz glass of mixture every  15 minutes until it is all gone. Make sure you do not drink anything 4 hours prior to your procedure. 11/24/21   Jonathon Bellows, MD  ranolazine (RANEXA) 500 MG 12 hr tablet Take 1 tablet by mouth 2 (two) times daily. 07/07/21 07/07/22  [provider]  sertraline (ZOLOFT) 100 MG tablet Take 100 mg by mouth daily. 06/13/21   [provider]  tetracycline (SUMYCIN) 500 MG capsule Take 1 capsule (500 mg total) by mouth 4 (four) times daily for 14 days. 11/27/21 12/11/21  Jonathon Bellows, MD  zolpidem (AMBIEN) 5 MG tablet Take 1 tablet (5 mg total) by mouth at bedtime as needed for sleep. 09/03/21   McDonough, Si Gaul, PA-C    Allergies as of 11/24/2021 - Review Complete 11/24/2021  Allergen Reaction Noted   Penicillin g Anaphylaxis and Other (See Comments) 02/10/2015    Family History  Problem Relation Age of Onset   Diabetes Mother    Diabetes Father    Cancer Father    Diabetes Brother     Social History   Socioeconomic History   Marital status: Widowed    Spouse name: Jeneen Rinks   Number of children: Not on file   Years  of education: Not on file   Highest education level: Not on file  Occupational History   Not on file  Tobacco Use   Smoking status: Never   Smokeless tobacco: Never  Vaping Use   Vaping Use: Never used  Substance and Sexual Activity   Alcohol use: No    Alcohol/week: 0.0 standard drinks   Drug use: No   Sexual activity: Yes  Other Topics Concern   Not on file  Social History Narrative   Not on file   Social Determinants of Health   Financial Resource Strain: Not on file  Food Insecurity: Not on file  Transportation Needs: Not on file  Physical Activity: Not on file  Stress: Not on file  Social Connections: Not on file  Intimate Partner Violence: Not on file    Review of Systems: See HPI, otherwise negative ROS  Physical Exam: BP (!) 171/89   Pulse 66   Temp 97.8 F (36.6 C) (Temporal)   Resp 18   Ht _0  (1.702 m)   Wt 105.7 kg   SpO2 99%   BMI 36.49 kg/m  General:   Alert,  pleasant and cooperative in NAD Head:  Normocephalic and atraumatic. Neck:  Supple; no masses or thyromegaly. Lungs:  Clear throughout to auscultation, normal respiratory effort.    Heart:  +S1, +S2, Regular rate and rhythm, No edema. Abdomen:  Soft, nontender and nondistended. Normal bowel sounds, without guarding, and without rebound.   Neurologic:  Alert and  oriented x4;  grossly normal neurologically.  Impression/Plan: Felicia Woods is here for an endoscopy and colonoscopy  to be performed for  evaluation of weight loss    Risks, benefits, limitations, and alternatives regarding endoscopy have been reviewed with the patient.  Questions have been answered.  All parties agreeable.   Jonathon Bellows, MD  12/02/2021, 10:13 AM

## 2021-12-02 NOTE — Anesthesia Postprocedure Evaluation (Signed)
Anesthesia Post Note  Patient: Engineer, production  Procedure(s) Performed: COLONOSCOPY WITH PROPOFOL ESOPHAGOGASTRODUODENOSCOPY (EGD)  Patient location during evaluation: Endoscopy Anesthesia Type: General Level of consciousness: awake and alert Pain management: pain level controlled Vital Signs Assessment: post-procedure vital signs reviewed and stable Respiratory status: spontaneous breathing, nonlabored ventilation, respiratory function stable and patient connected to nasal cannula oxygen Cardiovascular status: blood pressure returned to baseline and stable Postop Assessment: no apparent nausea or vomiting Anesthetic complications: no   No notable events documented.   Last Vitals:  Vitals:   12/02/21 1110 12/02/21 1127  BP: 124/81 140/86  Pulse: 88 81  Resp: 20 15  Temp:    SpO2: 96% 96%    Last Pain:  Vitals:   12/02/21 1127  TempSrc:   PainSc: 0-No pain                 Arita Miss

## 2021-12-02 NOTE — Transfer of Care (Signed)
Immediate Anesthesia Transfer of Care Note  Patient: Felicia Woods  Procedure(s) Performed: COLONOSCOPY WITH PROPOFOL ESOPHAGOGASTRODUODENOSCOPY (EGD)  Patient Location: Endoscopy Unit  Anesthesia Type:General  Level of Consciousness: drowsy  Airway & Oxygen Therapy: Patient Spontanous Breathing  Post-op Assessment: Report given to RN and Post -op Vital signs reviewed and stable  Post vital signs: Reviewed and stable  Last Vitals:  Vitals Value Taken Time  BP    Temp    Pulse    Resp    SpO2      Last Pain:  Vitals:   12/02/21 0936  TempSrc: Temporal  PainSc: 0-No pain         Complications: No notable events documented.

## 2021-12-02 NOTE — Anesthesia Preprocedure Evaluation (Addendum)
Anesthesia Evaluation  Patient identified by MRN, date of birth, ID band Patient awake    Reviewed: Allergy & Precautions, NPO status , Patient's Chart, lab work & pertinent test results  History of Anesthesia Complications Negative for: history of anesthetic complications  Airway Mallampati: II  TM Distance: >3 FB Neck ROM: Full    Dental  (+) Chipped   Pulmonary asthma , neg sleep apnea, neg COPD, Patient abstained from smoking.Not current smoker,    Pulmonary exam normal breath sounds clear to auscultation       Cardiovascular Exercise Tolerance: Good METShypertension, + angina with exertion + CAD, + Past MI, + Cardiac Stents, + CABG and +CHF  (-) dysrhythmias  Rhythm:Regular Rate:Normal - Systolic murmurs Cath 4174: "66 year old female with hypertension hyperlipidemia and known coronary disease status post coronary bypass graft with multiple previous issues with stenoses and occlusions of bypass grafts having progression of significant angina on appropriate medication management  Left ventricular pressures are normal  Occluded left circumflex and right coronary artery Occluded graft to PDA and graft to diagonal 1 99% stenosis left anterior descending artery Patent LIMA to the LAD Patent but stenosed graft to obtuse marginal 2 with 60% ostial stenosis and 85% mid stenosis  Plan PCI and stent placement graft to obtuse marginal 2 Medical management of all other grafts and collateral blood flow and atherosclerosis Dual antiplatelet therapy Cardiac rehabilitation"   Neuro/Psych  Headaches, PSYCHIATRIC DISORDERS Depression    GI/Hepatic neg GERD  ,(+)     (-) substance abuse  ,   Endo/Other  diabetes  Renal/GU negative Renal ROS     Musculoskeletal   Abdominal (+) + obese,   Peds  Hematology   Anesthesia Other Findings Past Medical History: No date: Asthma No date: Coronary artery disease No date:  Diabetes mellitus without complication ([facility]) No date: Heart attack ([place]) No date: Hyperlipidemia No date: Hypertension No date: MI (myocardial infarction) ([place]) No date: Migraine headache with aura No date: Ovarian neoplasm     Comment:  BRCA negative  Reproductive/Obstetrics                            Anesthesia Physical Anesthesia Plan  ASA: 3  Anesthesia Plan: General   Post-op Pain Management: Minimal or no pain anticipated   Induction: Intravenous  PONV Risk Score and Plan: 3 and Propofol infusion, TIVA and Ondansetron  Airway Management Planned: Nasal Cannula  Additional Equipment: None  Intra-op Plan:   Post-operative Plan:   Informed Consent: I have reviewed the patients History and Physical, chart, labs and discussed the procedure including the risks, benefits and alternatives for the proposed anesthesia with the patient or authorized representative who has indicated his/her understanding and acceptance.     Dental advisory given  Plan Discussed with: CRNA and Surgeon  Anesthesia Plan Comments: (Discussed risks of anesthesia with patient, including possibility of difficulty with spontaneous ventilation under anesthesia necessitating airway intervention, PONV, and rare risks such as cardiac or respiratory or neurological events, and allergic reactions. Discussed the role of CRNA in patient's perioperative care. Patient understands.)        Anesthesia Quick Evaluation

## 2021-12-03 ENCOUNTER — Ambulatory Visit (INDEPENDENT_AMBULATORY_CARE_PROVIDER_SITE_OTHER): Payer: Medicare Other | Admitting: Physician Assistant

## 2021-12-03 ENCOUNTER — Encounter: Payer: Self-pay | Admitting: Gastroenterology

## 2021-12-03 VITALS — BP 150/90 | HR 63 | Temp 97.8°F | Resp 16 | Ht 67.0 in | Wt 239.0 lb

## 2021-12-03 DIAGNOSIS — E1159 Type 2 diabetes mellitus with other circulatory complications: Secondary | ICD-10-CM | POA: Diagnosis not present

## 2021-12-03 DIAGNOSIS — I10 Essential (primary) hypertension: Secondary | ICD-10-CM

## 2021-12-03 DIAGNOSIS — A048 Other specified bacterial intestinal infections: Secondary | ICD-10-CM

## 2021-12-03 LAB — CELIAC DISEASE PANEL
Endomysial Ab, IgA: NEGATIVE
IgA: 280 mg/dL (ref 87–352)
Tissue Transglutaminase Ab, IgA: <2 U/mL (ref 0–3)

## 2021-12-03 LAB — POCT GLYCOSYLATED HEMOGLOBIN (HGB A1C): Hemoglobin A1C: 7.2 % — AB (ref 4.0–5.6)

## 2021-12-03 LAB — SURGICAL PATHOLOGY

## 2021-12-03 NOTE — Progress Notes (Signed)
[facility] [place], [place] [id number]  Internal MEDICINE  Office Visit Note  Patient Name: Felicia Woods  [id number]  [id number]  Date of Service: 12/09/2021  Chief Complaint  Patient presents with   Follow-up   Diabetes   Hyperlipidemia   Hypertension    HPI Pt is here for routine follow up -Had upper endoscopy and colonoscopy yesterday and is therefore very out of it in office because she hasn't slept well and doesn't feel great. Reports her throat is sore. She is eager to get out of -States the plan is to repeat colonoscopy in 30yrs and they are waiting on pathology still, however patient states she never wants to go through that procedure again -She also was found to have H.Pylori and is being treated by GI -reports that Ortho said her back was fine and she feels better, however no documentation from her visit is available -invokana out because needs new auth? Will resend and complete PA if required. Pt should follow up with pharmacy and let us know if any further problems getting this -Bp elevated likely due to pt not feeling well and not having slept and will monitor  Current Medication: Outpatient Encounter Medications as of 12/03/2021  Medication Sig   Accu-Chek Softclix Lancets lancets Use as instructed twice a day to check blood sugars  DIAG -E11.59   albuterol (PROVENTIL HFA;VENTOLIN HFA) 108 (90 Base) MCG/ACT inhaler Inhale 2 puffs into the lungs every 6 (six) hours as needed for wheezing or shortness of breath.   amLODipine (NORVASC) 10 MG tablet Take 1 tablet (10 mg total) by mouth daily.   aspirin EC 81 MG tablet Take 81 mg by mouth daily.   atorvastatin (LIPITOR) 80 MG tablet Take 1 tablet (80 mg total) by mouth daily.   Bismuth Subsalicylate 754 MG TABS Take 1 tablet (262 mg total) by mouth in the morning, at noon, in the evening, and at bedtime for 14 days.   clopidogrel (PLAVIX) 75 MG tablet Take 1 tablet (75 mg total) by mouth daily.    cyclobenzaprine (FLEXERIL) 10 MG tablet Take 1 tablet (10 mg total) by mouth at bedtime.   Dulaglutide (TRULICITY) 1.5 HK/0.6VP SOPN INJECT 1.$RemoveBefor'5MG'IUfgDUjIGFfS$  INTO THE SKIN ONCE A WEEK   ergocalciferol (VITAMIN D2) 1.25 MG (50000 UT) capsule Take 1 capsule (50,000 Units total) by mouth once a week.   famotidine (PEPCID) 20 MG tablet Take 1 tablet by mouth in the morning and at bedtime.   furosemide (LASIX) 40 MG tablet Take 1 tablet (40 mg total) by mouth 2 (two) times daily.   glucose blood (ACCU-CHEK GUIDE) test strip Use as instructed to check blood sugars twice a day  E11.59   losartan (COZAAR) 100 MG tablet Take 1 tablet (100 mg total) by mouth daily. Take one tab po qd   methocarbamol (ROBAXIN) 500 MG tablet Take 1 tablet by mouth as needed.   metoprolol succinate (TOPROL-XL) 25 MG 24 hr tablet Take 25 mg by mouth daily.   metroNIDAZOLE (FLAGYL) 250 MG tablet Take 1 tablet (250 mg total) by mouth 4 (four) times daily for 14 days.   montelukast (SINGULAIR) 10 MG tablet Take 1 tablet (10 mg total) by mouth at bedtime.   nitroGLYCERIN (NITROSTAT) 0.4 MG SL tablet DISSOLVE ONE TABLET UNDER THE TONGUE EVERY 5 MINUTES AS NEEDED FOR CHEST PAIN.  DO NOT EXCEED A TOTAL OF 3 DOSES IN 15 MINUTES   omeprazole (PRILOSEC) 20 MG capsule Take 1 capsule (20 mg  total) by mouth 2 (two) times daily before a meal for 14 days.   oxybutynin (DITROPAN-XL) 5 MG 24 hr tablet Take 1 tablet by mouth at bedtime.   polyethylene glycol-electrolytes (NULYTELY) 420 g solution Prepare according to package instructions. Starting at 5:00 PM: Drink one 8 oz glass of mixture every 15 minutes until you finish half of the jug. Five hours prior to procedure, drink 8 oz glass of mixture every 15 minutes until it is all gone. Make sure you do not drink anything 4 hours prior to your procedure.   ranolazine (RANEXA) 500 MG 12 hr tablet Take 1 tablet by mouth 2 (two) times daily.   sertraline (ZOLOFT) 100 MG tablet Take 100 mg by mouth daily.    tetracycline (SUMYCIN) 500 MG capsule Take 1 capsule (500 mg total) by mouth 4 (four) times daily for 14 days.   zolpidem (AMBIEN) 5 MG tablet Take 1 tablet (5 mg total) by mouth at bedtime as needed for sleep.   [DISCONTINUED] canagliflozin (INVOKANA) 300 MG TABS tablet Take 1 tablet (300 mg total) by mouth daily before breakfast.   canagliflozin (INVOKANA) 300 MG TABS tablet Take 1 tablet (300 mg total) by mouth daily before breakfast.   isosorbide mononitrate (IMDUR) 60 MG 24 hr tablet Take 1 tablet (60 mg total) by mouth daily.   No facility-administered encounter medications on file as of 12/03/2021.    Surgical History: Past Surgical History:  Procedure Laterality Date   ABDOMINAL HYSTERECTOMY     CARDIAC CATHETERIZATION     CARDIAC CATHETERIZATION N/A 10/18/2015   Procedure: Left Heart Cath and Cors/Grafts Angiography;  Surgeon: Lorretta Harp, MD;  Location: [place] [facility];  Service: Cardiovascular;  Laterality: N/A;   CARDIAC CATHETERIZATION N/A 10/18/2015   Procedure: Coronary Stent Intervention;  Surgeon: Lorretta Harp, MD;  Location: [place] [facility];  Service: Cardiovascular;  Laterality: N/A;   CARDIAC SURGERY     CHOLECYSTECTOMY     COLONOSCOPY WITH PROPOFOL N/A 12/02/2021   Procedure: COLONOSCOPY WITH PROPOFOL;  Surgeon: Jonathon Bellows, MD;  Location: [facility];  Service: Gastroenterology;  Laterality: N/A;   CORONARY ANGIOPLASTY     CORONARY ARTERY BYPASS GRAFT     4 vessels - 2010   CORONARY STENT INTERVENTION N/A 02/16/2017   Procedure: CORONARY STENT INTERVENTION;  Surgeon: Yolonda Kida, MD;  Location: [place] [facility];  Service: Cardiovascular;  Laterality: N/A;   CORONARY STENT INTERVENTION N/A 02/13/2019   Procedure: CORONARY STENT INTERVENTION;  Surgeon: Nelva Bush, MD;  Location: [place] [facility];  Service: Cardiovascular;  Laterality: N/A;  SVG to RCA   CORONARY STENT INTERVENTION Left 03/08/2019   Procedure: CORONARY STENT INTERVENTION;   Surgeon: Yolonda Kida, MD;  Location: [place] [facility];  Service: Cardiovascular;  Laterality: Left;   CORONARY STENT INTERVENTION N/A 11/04/2020   Procedure: CORONARY STENT INTERVENTION;  Surgeon: Nelva Bush, MD;  Location: [place] [facility];  Service: Cardiovascular;  Laterality: N/A;   ESOPHAGOGASTRODUODENOSCOPY N/A 12/02/2021   Procedure: ESOPHAGOGASTRODUODENOSCOPY (EGD);  Surgeon: Jonathon Bellows, MD;  Location: [facility] ENDOSCOPY;  Service: Gastroenterology;  Laterality: N/A;   LEFT HEART CATH AND CORONARY ANGIOGRAPHY Left 02/16/2017   Procedure: LEFT HEART CATH AND CORONARY ANGIOGRAPHY;  Surgeon: Corey Skains, MD;  Location: [place] [facility];  Service: Cardiovascular;  Laterality: Left;   LEFT HEART CATH AND CORS/GRAFTS ANGIOGRAPHY Left 11/23/2017   Procedure: LEFT HEART CATH AND CORS/GRAFTS ANGIOGRAPHY;  Surgeon: Corey Skains, MD;  Location: [place] [facility];  Service: Cardiovascular;  Laterality: Left;   LEFT HEART CATH AND CORS/GRAFTS ANGIOGRAPHY N/A 02/13/2019   Procedure: LEFT HEART CATH AND CORS/GRAFTS ANGIOGRAPHY;  Surgeon: Corey Skains, MD;  Location: [place] [facility];  Service: Cardiovascular;  Laterality: N/A;   LEFT HEART CATH AND CORS/GRAFTS ANGIOGRAPHY N/A 07/03/2019   Procedure: LEFT HEART CATH AND CORS/GRAFTS ANGIOGRAPHY;  Surgeon: Corey Skains, MD;  Location: [place] [facility];  Service: Cardiovascular;  Laterality: N/A;   LEFT HEART CATH AND CORS/GRAFTS ANGIOGRAPHY N/A 11/04/2020   Procedure: LEFT HEART CATH AND CORS/GRAFTS ANGIOGRAPHY;  Surgeon: Corey Skains, MD;  Location: [place] [facility];  Service: Cardiovascular;  Laterality: N/A;    Medical History: Past Medical History:  Diagnosis Date   Asthma    Coronary artery disease    Diabetes mellitus without complication ([place])    Heart attack ([place])    Hyperlipidemia    Hypertension    MI (myocardial infarction) ([place])    Migraine headache with aura    Ovarian neoplasm     BRCA negative    Family History: Family History  Problem Relation Age of Onset   Diabetes Mother    Diabetes Father    Cancer Father    Diabetes Brother     Social History   Socioeconomic History   Marital status: Widowed    Spouse name: Jeneen Rinks   Number of children: Not on file   Years of education: Not on file   Highest education level: Not on file  Occupational History   Not on file  Tobacco Use   Smoking status: Never   Smokeless tobacco: Never  Vaping Use   Vaping Use: Never used  Substance and Sexual Activity   Alcohol use: No    Alcohol/week: 0.0 standard drinks   Drug use: No   Sexual activity: Yes  Other Topics Concern   Not on file  Social History Narrative   Not on file   Social Determinants of Health   Financial Resource Strain: Not on file  Food Insecurity: Not on file  Transportation Needs: Not on file  Physical Activity: Not on file  Stress: Not on file  Social Connections: Not on file  Intimate Partner Violence: Not on file      Review of Systems  Constitutional:  Positive for fatigue. Negative for chills and unexpected weight change.  HENT:  Positive for sore throat. Negative for congestion, postnasal drip, rhinorrhea and sneezing.   Eyes:  Negative for redness.  Respiratory:  Negative for cough, chest tightness and shortness of breath.   Cardiovascular:  Negative for chest pain and palpitations.  Gastrointestinal:  Positive for nausea. Negative for abdominal pain, constipation, diarrhea and vomiting.  Genitourinary:  Negative for dysuria and frequency.  Musculoskeletal:  Positive for arthralgias. Negative for back pain, joint swelling and neck pain.  Skin:  Negative for rash.  Neurological: Negative.  Negative for tremors and numbness.  Hematological:  Negative for adenopathy. Does not bruise/bleed easily.  Psychiatric/Behavioral:  Positive for sleep disturbance. Negative for behavioral problems (Depression) and suicidal ideas. The  patient is not nervous/anxious.    Vital Signs: BP (!) 150/90   Pulse 63   Temp 97.8 F (36.6 C)   Resp 16   Ht $R'5\' 7"'Ha$  (1.702 m)   Wt 239 lb (108.4 kg)   SpO2 97%   BMI 37.43 kg/m    Physical Exam Vitals and nursing note reviewed.  Constitutional:  General: She is not in acute distress.    Appearance: Normal appearance. She is well-developed. She is obese. She is not diaphoretic.  HENT:     Head: Normocephalic and atraumatic.     Mouth/Throat:     Pharynx: No oropharyngeal exudate.  Eyes:     Pupils: Pupils are equal, round, and reactive to light.  Neck:     Thyroid: No thyromegaly.     Vascular: No JVD.     Trachea: No tracheal deviation.  Cardiovascular:     Rate and Rhythm: Normal rate and regular rhythm.     Heart sounds: Normal heart sounds. No murmur heard.   No friction rub. No gallop.  Pulmonary:     Effort: Pulmonary effort is normal. No respiratory distress.     Breath sounds: No wheezing or rales.  Chest:     Chest wall: No tenderness.  Abdominal:     General: Bowel sounds are normal.     Palpations: Abdomen is soft.  Musculoskeletal:        General: Normal range of motion.     Cervical back: Normal range of motion and neck supple.  Lymphadenopathy:     Cervical: No cervical adenopathy.  Skin:    General: Skin is warm and dry.  Neurological:     Mental Status: She is alert and oriented to person, place, and time.     Cranial Nerves: No cranial nerve deficit.  Psychiatric:        Thought Content: Thought content normal.        Judgment: Judgment normal.       Assessment/Plan: 1. Type 2 diabetes mellitus with other circulatory complication, without long-term current use of insulin (HCC) - POCT HgB A1C is 7.2 which is up slightly from 7.1 last check. Patient has been without invokana due to pharmacy telling her she needed an auth for it. Will resend and have her pharmacy send any PA to our office. Patient advised to follow up with pharmacy and  let us know if any further problems attaining this and will continue other medications as before which working on diet and exercise. - canagliflozin (INVOKANA) 300 MG TABS tablet; Take 1 tablet (300 mg total) by mouth daily before breakfast.  Dispense: 90 tablet; Refill: 1  2. Essential hypertension Elevated due to patient not sleeping well the past few nights and not feeling wel from procedures. Will monitor and continue current medications  3. H. pylori infection Followed by GI    General Counseling: Hisae verbalizes understanding of the findings of todays visit and agrees with plan of treatment. I have discussed any further diagnostic evaluation that may be needed or ordered today. We also reviewed her medications today. she has been encouraged to call the office with any questions or concerns that should arise related to todays visit.    Orders Placed This Encounter  Procedures   POCT HgB A1C    Meds ordered this encounter  Medications   canagliflozin (INVOKANA) 300 MG TABS tablet    Sig: Take 1 tablet (300 mg total) by mouth daily before breakfast.    Dispense:  90 tablet    Refill:  1    This patient was seen by Drema Dallas, PA-C in collaboration with Dr. Clayborn Bigness as a part of collaborative care agreement.   Total time spent:30 Minutes Time spent includes review of chart, medications, test results, and follow up plan with the patient.      Dr Lavera Guise  Internal medicine

## 2021-12-04 MED ORDER — CANAGLIFLOZIN 300 MG PO TABS: 300.0000 mg | ORAL_TABLET | Freq: Every day | ORAL | 1 refills | Status: DC

## 2021-12-06 ENCOUNTER — Encounter: Payer: Self-pay | Admitting: Gastroenterology

## 2021-12-06 NOTE — Progress Notes (Signed)
ok 

## 2021-12-14 ENCOUNTER — Telehealth: Payer: Self-pay

## 2021-12-14 NOTE — Telephone Encounter (Signed)
PA for INVOKANA 300 mg sent 12/14/21 @ 153 am

## 2021-12-15 ENCOUNTER — Ambulatory Visit: Payer: Medicare (Managed Care)

## 2021-12-18 ENCOUNTER — Telehealth: Payer: Self-pay

## 2021-12-22 ENCOUNTER — Other Ambulatory Visit: Payer: Self-pay

## 2021-12-22 MED ORDER — EMPAGLIFLOZIN 25 MG PO TABS: 25.0000 mg | ORAL_TABLET | Freq: Every day | ORAL | 3 refills | Status: DC

## 2021-12-22 NOTE — Telephone Encounter (Signed)
Sent prescription Jardiance 25 mg daily to pharmacy

## 2021-12-24 ENCOUNTER — Encounter
Admission: RE | Disposition: A | Discharge status: Home / Self Care | Payer: Self-pay | Source: Home / Self Care | Attending: Cardiology

## 2021-12-24 ENCOUNTER — Ambulatory Visit
Admission: RE | Admit: 2021-12-24 | Discharge: 2021-12-25 | Disposition: A | Discharge status: Home / Self Care | Payer: Medicare Other | Attending: Cardiology | Admitting: Cardiology

## 2021-12-24 ENCOUNTER — Encounter: Payer: Self-pay | Admitting: Internal Medicine

## 2021-12-24 ENCOUNTER — Other Ambulatory Visit: Payer: Self-pay

## 2021-12-24 DIAGNOSIS — E119 Type 2 diabetes mellitus without complications: Secondary | ICD-10-CM | POA: Insufficient documentation

## 2021-12-24 DIAGNOSIS — R079 Chest pain, unspecified: Secondary | ICD-10-CM

## 2021-12-24 DIAGNOSIS — Z7982 Long term (current) use of aspirin: Secondary | ICD-10-CM | POA: Insufficient documentation

## 2021-12-24 DIAGNOSIS — Z7985 Long-term (current) use of injectable non-insulin antidiabetic drugs: Secondary | ICD-10-CM | POA: Insufficient documentation

## 2021-12-24 DIAGNOSIS — E785 Hyperlipidemia, unspecified: Secondary | ICD-10-CM | POA: Diagnosis not present

## 2021-12-24 DIAGNOSIS — I2582 Chronic total occlusion of coronary artery: Secondary | ICD-10-CM | POA: Diagnosis not present

## 2021-12-24 DIAGNOSIS — I2089 Other forms of angina pectoris: Secondary | ICD-10-CM | POA: Diagnosis present

## 2021-12-24 DIAGNOSIS — Z955 Presence of coronary angioplasty implant and graft: Secondary | ICD-10-CM | POA: Insufficient documentation

## 2021-12-24 DIAGNOSIS — I25708 Atherosclerosis of coronary artery bypass graft(s), unspecified, with other forms of angina pectoris: Secondary | ICD-10-CM | POA: Diagnosis not present

## 2021-12-24 DIAGNOSIS — I208 Other forms of angina pectoris: Secondary | ICD-10-CM | POA: Diagnosis present

## 2021-12-24 DIAGNOSIS — I10 Essential (primary) hypertension: Secondary | ICD-10-CM | POA: Insufficient documentation

## 2021-12-24 DIAGNOSIS — Z7902 Long term (current) use of antithrombotics/antiplatelets: Secondary | ICD-10-CM | POA: Insufficient documentation

## 2021-12-24 DIAGNOSIS — Z7984 Long term (current) use of oral hypoglycemic drugs: Secondary | ICD-10-CM | POA: Diagnosis not present

## 2021-12-24 HISTORY — PX: LEFT HEART CATH AND CORONARY ANGIOGRAPHY: CATH118249

## 2021-12-24 HISTORY — PX: CORONARY STENT INTERVENTION: CATH118234

## 2021-12-24 LAB — GLUCOSE, CAPILLARY
Glucose-Capillary: 120 mg/dL — ABNORMAL HIGH (ref 70–99)
Glucose-Capillary: 119 mg/dL — ABNORMAL HIGH (ref 70–99)
Glucose-Capillary: 222 mg/dL — ABNORMAL HIGH (ref 70–99)
Glucose-Capillary: 227 mg/dL — ABNORMAL HIGH (ref 70–99)

## 2021-12-24 LAB — POCT ACTIVATED CLOTTING TIME: Activated Clotting Time: 389 seconds

## 2021-12-24 SURGERY — LEFT HEART CATH AND CORONARY ANGIOGRAPHY
Anesthesia: Moderate Sedation

## 2021-12-24 MED ORDER — ATORVASTATIN CALCIUM 80 MG PO TABS
80.0000 mg | ORAL_TABLET | Freq: Every day | ORAL | Status: DC
  Administered 2021-12-24: 80 mg via ORAL
  Filled 2021-12-24: qty 1

## 2021-12-24 MED ORDER — HYDRALAZINE HCL 20 MG/ML IJ SOLN
INTRAMUSCULAR | Status: DC | PRN
  Administered 2021-12-24: 5 mg via INTRAVENOUS

## 2021-12-24 MED ORDER — SODIUM CHLORIDE 0.9 % IV SOLN: 250.0000 mL | INTRAVENOUS | Status: DC | PRN

## 2021-12-24 MED ORDER — MIDAZOLAM HCL 2 MG/2ML IJ SOLN
INTRAMUSCULAR | Status: AC
  Filled 2021-12-24: qty 2

## 2021-12-24 MED ORDER — OXYCODONE HCL 5 MG PO TABS: 5.0000 mg | ORAL_TABLET | Freq: Once | ORAL | Status: AC

## 2021-12-24 MED ORDER — ACETAMINOPHEN 325 MG PO TABS
650.0000 mg | ORAL_TABLET | ORAL | Status: DC | PRN
  Administered 2021-12-24 – 2021-12-25 (×2): 650 mg via ORAL
  Filled 2021-12-24 (×2): qty 2

## 2021-12-24 MED ORDER — AMLODIPINE BESYLATE 5 MG PO TABS
10.0000 mg | ORAL_TABLET | Freq: Every day | ORAL | Status: DC
  Administered 2021-12-25: 10 mg via ORAL
  Filled 2021-12-24: qty 2

## 2021-12-24 MED ORDER — ONDANSETRON HCL 4 MG/2ML IJ SOLN
INTRAMUSCULAR | Status: AC
  Filled 2021-12-24: qty 2

## 2021-12-24 MED ORDER — LIDOCAINE HCL (PF) 1 % IJ SOLN
INTRAMUSCULAR | Status: DC | PRN
  Administered 2021-12-24: 20 mL

## 2021-12-24 MED ORDER — IOHEXOL 300 MG/ML  SOLN
INTRAMUSCULAR | Status: DC | PRN
  Administered 2021-12-24: 56 mL

## 2021-12-24 MED ORDER — MIDAZOLAM HCL 2 MG/2ML IJ SOLN
INTRAMUSCULAR | Status: DC | PRN
  Administered 2021-12-24 (×2): .5 mg via INTRAVENOUS

## 2021-12-24 MED ORDER — SODIUM CHLORIDE 0.9% FLUSH
3.0000 mL | Freq: Two times a day (BID) | INTRAVENOUS | Status: DC
  Administered 2021-12-24 – 2021-12-25 (×2): 3 mL via INTRAVENOUS

## 2021-12-24 MED ORDER — SODIUM CHLORIDE 0.9% FLUSH: 3.0000 mL | INTRAVENOUS | Status: DC | PRN

## 2021-12-24 MED ORDER — ASPIRIN 81 MG PO CHEW
CHEWABLE_TABLET | ORAL | Status: AC
  Administered 2021-12-24: 81 mg via ORAL
  Filled 2021-12-24: qty 1

## 2021-12-24 MED ORDER — HEPARIN (PORCINE) IN NACL 1000-0.9 UT/500ML-% IV SOLN
INTRAVENOUS | Status: DC | PRN
  Administered 2021-12-24 (×2): 500 mL

## 2021-12-24 MED ORDER — FENTANYL CITRATE (PF) 100 MCG/2ML IJ SOLN
INTRAMUSCULAR | Status: DC | PRN
  Administered 2021-12-24 (×3): 25 ug via INTRAVENOUS

## 2021-12-24 MED ORDER — SODIUM CHLORIDE 0.9 % WEIGHT BASED INFUSION
1.0000 mL/kg/h | INTRAVENOUS | Status: DC
Start: 2021-12-24 — End: 2021-12-24

## 2021-12-24 MED ORDER — OXYCODONE HCL 5 MG PO TABS
ORAL_TABLET | ORAL | Status: AC
  Administered 2021-12-24: 5 mg via ORAL
  Filled 2021-12-24: qty 1

## 2021-12-24 MED ORDER — METOPROLOL SUCCINATE ER 50 MG PO TB24: 25.0000 mg | ORAL_TABLET | Freq: Every day | ORAL | Status: DC

## 2021-12-24 MED ORDER — CYCLOBENZAPRINE HCL 10 MG PO TABS
10.0000 mg | ORAL_TABLET | Freq: Every evening | ORAL | Status: DC | PRN
Start: 2021-12-24 — End: 2021-12-25

## 2021-12-24 MED ORDER — SERTRALINE HCL 100 MG PO TABS
100.0000 mg | ORAL_TABLET | Freq: Every day | ORAL | Status: DC
  Administered 2021-12-25: 100 mg via ORAL
  Filled 2021-12-24: qty 1

## 2021-12-24 MED ORDER — LIDOCAINE HCL 1 % IJ SOLN
INTRAMUSCULAR | Status: AC
  Filled 2021-12-24: qty 20

## 2021-12-24 MED ORDER — HEPARIN SODIUM (PORCINE) 1000 UNIT/ML IJ SOLN
INTRAMUSCULAR | Status: AC
  Filled 2021-12-24: qty 10

## 2021-12-24 MED ORDER — SODIUM CHLORIDE 0.9 % IV SOLN: INTRAVENOUS | Status: AC

## 2021-12-24 MED ORDER — CLOPIDOGREL BISULFATE 75 MG PO TABS
75.0000 mg | ORAL_TABLET | Freq: Every day | ORAL | Status: DC
  Administered 2021-12-25: 75 mg via ORAL
  Filled 2021-12-24: qty 1

## 2021-12-24 MED ORDER — FENTANYL CITRATE (PF) 100 MCG/2ML IJ SOLN
INTRAMUSCULAR | Status: AC
  Filled 2021-12-24: qty 2

## 2021-12-24 MED ORDER — OXYCODONE HCL 5 MG PO TABS
5.0000 mg | ORAL_TABLET | Freq: Once | ORAL | Status: AC
  Administered 2021-12-24: 5 mg via ORAL
  Filled 2021-12-24: qty 1

## 2021-12-24 MED ORDER — BIVALIRUDIN BOLUS VIA INFUSION - CUPID
INTRAVENOUS | Status: DC | PRN
  Administered 2021-12-24: 80.25 mg via INTRAVENOUS

## 2021-12-24 MED ORDER — CLOPIDOGREL BISULFATE 75 MG PO TABS
ORAL_TABLET | ORAL | Status: AC
  Filled 2021-12-24: qty 8

## 2021-12-24 MED ORDER — FENTANYL CITRATE (PF) 100 MCG/2ML IJ SOLN
INTRAMUSCULAR | Status: DC | PRN
  Administered 2021-12-24: 25 ug via INTRAVENOUS
  Administered 2021-12-24: 50 ug via INTRAVENOUS

## 2021-12-24 MED ORDER — ASPIRIN 81 MG PO TBEC
81.0000 mg | DELAYED_RELEASE_TABLET | Freq: Every day | ORAL | Status: DC
  Administered 2021-12-25: 81 mg via ORAL
  Filled 2021-12-24: qty 1

## 2021-12-24 MED ORDER — LOSARTAN POTASSIUM 50 MG PO TABS
100.0000 mg | ORAL_TABLET | Freq: Every day | ORAL | Status: DC
  Administered 2021-12-25: 100 mg via ORAL
  Filled 2021-12-24: qty 2

## 2021-12-24 MED ORDER — ONDANSETRON HCL 4 MG/2ML IJ SOLN
4.0000 mg | Freq: Four times a day (QID) | INTRAMUSCULAR | Status: DC | PRN
  Administered 2021-12-24: 4 mg via INTRAVENOUS

## 2021-12-24 MED ORDER — BIVALIRUDIN TRIFLUOROACETATE 250 MG IV SOLR
INTRAVENOUS | Status: AC
  Filled 2021-12-24: qty 250

## 2021-12-24 MED ORDER — ISOSORBIDE MONONITRATE ER 60 MG PO TB24
60.0000 mg | ORAL_TABLET | Freq: Every day | ORAL | Status: DC
  Administered 2021-12-25: 60 mg via ORAL
  Filled 2021-12-24: qty 1

## 2021-12-24 MED ORDER — MONTELUKAST SODIUM 10 MG PO TABS
10.0000 mg | ORAL_TABLET | Freq: Every day | ORAL | Status: DC
  Administered 2021-12-24: 10 mg via ORAL
  Filled 2021-12-24: qty 1

## 2021-12-24 MED ORDER — SODIUM CHLORIDE 0.9 % WEIGHT BASED INFUSION
3.0000 mL/kg/h | INTRAVENOUS | Status: DC
  Administered 2021-12-24: 3 mL/kg/h via INTRAVENOUS

## 2021-12-24 MED ORDER — INSULIN ASPART 100 UNIT/ML IJ SOLN
0.0000 [IU] | Freq: Three times a day (TID) | INTRAMUSCULAR | Status: DC
  Administered 2021-12-24: 3 [IU] via SUBCUTANEOUS
  Administered 2021-12-25: 2 [IU] via SUBCUTANEOUS
  Filled 2021-12-24 (×2): qty 1

## 2021-12-24 MED ORDER — ACETAMINOPHEN 325 MG PO TABS
ORAL_TABLET | ORAL | Status: AC
  Administered 2021-12-24: 650 mg via ORAL
  Filled 2021-12-24: qty 2

## 2021-12-24 MED ORDER — HYDRALAZINE HCL 20 MG/ML IJ SOLN
INTRAMUSCULAR | Status: AC
  Filled 2021-12-24: qty 1

## 2021-12-24 MED ORDER — ASPIRIN 81 MG PO CHEW
CHEWABLE_TABLET | ORAL | Status: AC
  Filled 2021-12-24: qty 3

## 2021-12-24 MED ORDER — CLOPIDOGREL BISULFATE 75 MG PO TABS
ORAL_TABLET | ORAL | Status: DC | PRN
  Administered 2021-12-24: 600 mg via ORAL

## 2021-12-24 MED ORDER — CANAGLIFLOZIN 100 MG PO TABS
300.0000 mg | ORAL_TABLET | Freq: Every day | ORAL | Status: DC
  Administered 2021-12-25: 300 mg via ORAL
  Filled 2021-12-24: qty 3

## 2021-12-24 MED ORDER — SODIUM CHLORIDE 0.9 % IV SOLN
INTRAVENOUS | Status: DC | PRN
  Administered 2021-12-24: 1.75 mg/kg/h via INTRAVENOUS

## 2021-12-24 MED ORDER — ASPIRIN 81 MG PO CHEW: 81.0000 mg | CHEWABLE_TABLET | ORAL | Status: AC

## 2021-12-24 MED ORDER — ASPIRIN 81 MG PO CHEW
CHEWABLE_TABLET | ORAL | Status: DC | PRN
  Administered 2021-12-24: 243 mg via ORAL

## 2021-12-24 MED ORDER — IOHEXOL 300 MG/ML  SOLN
INTRAMUSCULAR | Status: DC | PRN
  Administered 2021-12-24: 93 mL

## 2021-12-24 MED ORDER — MIDAZOLAM HCL 2 MG/2ML IJ SOLN
INTRAMUSCULAR | Status: DC | PRN
  Administered 2021-12-24: 1 mg via INTRAVENOUS
  Administered 2021-12-24: .5 mg via INTRAVENOUS

## 2021-12-24 MED ORDER — METOPROLOL SUCCINATE ER 25 MG PO TB24
25.0000 mg | ORAL_TABLET | Freq: Every day | ORAL | Status: DC
  Administered 2021-12-24: 25 mg via ORAL
  Filled 2021-12-24 (×3): qty 1
  Filled 2021-12-24: qty 0.5

## 2021-12-24 MED ORDER — RANOLAZINE ER 500 MG PO TB12
500.0000 mg | ORAL_TABLET | Freq: Two times a day (BID) | ORAL | Status: DC
  Administered 2021-12-24 – 2021-12-25 (×2): 500 mg via ORAL
  Filled 2021-12-24 (×2): qty 1

## 2021-12-24 MED ORDER — FAMOTIDINE 20 MG PO TABS
20.0000 mg | ORAL_TABLET | Freq: Two times a day (BID) | ORAL | Status: DC
  Administered 2021-12-24 – 2021-12-25 (×2): 20 mg via ORAL
  Filled 2021-12-24 (×2): qty 1

## 2021-12-24 SURGICAL SUPPLY — 18 items
CATH INFINITI 5FR MULTPACK ANG (CATHETERS) ×1 IMPLANT
CATH VISTA GUIDE 6FR LCB (CATHETERS) ×1 IMPLANT
DEVICE CLOSURE MYNXGRIP 6/7F (Vascular Products) ×1 IMPLANT
DRAPE BRACHIAL (DRAPES) IMPLANT
KIT ENCORE 26 ADVANTAGE (KITS) ×1 IMPLANT
NDL PERC 18GX7CM (NEEDLE) IMPLANT
NEEDLE PERC 18GX7CM (NEEDLE) ×3 IMPLANT
PACK CARDIAC CATH (CUSTOM PROCEDURE TRAY) ×3 IMPLANT
PROTECTION STATION PRESSURIZED (MISCELLANEOUS) ×3
SET ATX SIMPLICITY (MISCELLANEOUS) ×1 IMPLANT
SHEATH AVANTI 5FR X 11CM (SHEATH) ×1 IMPLANT
SHEATH AVANTI 6FR X 11CM (SHEATH) ×1 IMPLANT
STATION PROTECTION PRESSURIZED (MISCELLANEOUS) IMPLANT
STENT ONYX FRONTIER 3.0X12 (Permanent Stent) ×1 IMPLANT
TUBING CIL FLEX 10 FLL-RA (TUBING) ×1 IMPLANT
VALVE COPILOT STAT (MISCELLANEOUS) ×1 IMPLANT
WIRE GUIDERIGHT .035X150 (WIRE) ×1 IMPLANT
WIRE RUNTHROUGH .014X180CM (WIRE) ×1 IMPLANT

## 2021-12-25 ENCOUNTER — Other Ambulatory Visit: Payer: Self-pay

## 2021-12-25 DIAGNOSIS — I25708 Atherosclerosis of coronary artery bypass graft(s), unspecified, with other forms of angina pectoris: Secondary | ICD-10-CM | POA: Diagnosis not present

## 2021-12-25 LAB — CBC
HCT: 39.2 % (ref 36.0–46.0)
Hemoglobin: 12.3 g/dL (ref 12.0–15.0)
MCH: 25 pg — ABNORMAL LOW (ref 26.0–34.0)
MCHC: 31.4 g/dL (ref 30.0–36.0)
MCV: 79.7 fL — ABNORMAL LOW (ref 80.0–100.0)
Platelets: 235 10*3/uL (ref 150–400)
RBC: 4.92 MIL/uL (ref 3.87–5.11)
RDW: 17.2 % — ABNORMAL HIGH (ref 11.5–15.5)
WBC: 7.2 10*3/uL (ref 4.0–10.5)
nRBC: 0 % (ref 0.0–0.2)

## 2021-12-25 LAB — BASIC METABOLIC PANEL
Anion gap: 5 (ref 5–15)
BUN: 16 mg/dL (ref 8–23)
CO2: 28 mmol/L (ref 22–32)
Calcium: 9.1 mg/dL (ref 8.9–10.3)
Chloride: 103 mmol/L (ref 98–111)
Creatinine, Ser: 0.74 mg/dL (ref 0.44–1.00)
GFR, Estimated: >60 mL/min (ref >60)
Glucose, Bld: 153 mg/dL — ABNORMAL HIGH (ref 70–99)
Potassium: 3.7 mmol/L (ref 3.5–5.1)
Sodium: 136 mmol/L (ref 135–145)

## 2021-12-25 LAB — GLUCOSE, CAPILLARY: Glucose-Capillary: 194 mg/dL — ABNORMAL HIGH (ref 70–99)

## 2021-12-25 MED ORDER — DAPAGLIFLOZIN PROPANEDIOL 10 MG PO TABS: 10.0000 mg | ORAL_TABLET | Freq: Every day | ORAL | 1 refills | Status: DC

## 2021-12-26 LAB — LIPOPROTEIN A (LPA): Lipoprotein (a): 516.3 nmol/L — ABNORMAL HIGH (ref <75.0)

## 2021-12-28 ENCOUNTER — Encounter: Payer: Self-pay | Admitting: Internal Medicine

## 2021-12-29 ENCOUNTER — Ambulatory Visit: Payer: Medicare (Managed Care)

## 2022-01-04 DIAGNOSIS — M25561 Pain in right knee: Secondary | ICD-10-CM | POA: Insufficient documentation

## 2022-01-04 DIAGNOSIS — M13861 Other specified arthritis, right knee: Secondary | ICD-10-CM | POA: Diagnosis not present

## 2022-01-04 DIAGNOSIS — E119 Type 2 diabetes mellitus without complications: Secondary | ICD-10-CM | POA: Diagnosis not present

## 2022-01-04 DIAGNOSIS — M1711 Unilateral primary osteoarthritis, right knee: Secondary | ICD-10-CM | POA: Insufficient documentation

## 2022-01-07 ENCOUNTER — Ambulatory Visit: Payer: Medicare (Managed Care) | Admitting: Physician Assistant

## 2022-01-09 ENCOUNTER — Encounter: Payer: Self-pay | Admitting: Internal Medicine

## 2022-01-11 ENCOUNTER — Other Ambulatory Visit: Payer: Self-pay

## 2022-01-11 ENCOUNTER — Emergency Department
Admission: EM | Admit: 2022-01-11 | Discharge: 2022-01-11 | Disposition: A | Discharge status: Home / Self Care | Payer: Medicare (Managed Care) | Attending: Emergency Medicine | Admitting: Emergency Medicine

## 2022-01-11 ENCOUNTER — Emergency Department: Payer: Medicare (Managed Care)

## 2022-01-11 DIAGNOSIS — J209 Acute bronchitis, unspecified: Secondary | ICD-10-CM | POA: Insufficient documentation

## 2022-01-11 DIAGNOSIS — R059 Cough, unspecified: Secondary | ICD-10-CM | POA: Diagnosis not present

## 2022-01-11 DIAGNOSIS — J9811 Atelectasis: Secondary | ICD-10-CM | POA: Diagnosis not present

## 2022-01-11 MED ORDER — PREDNISONE 10 MG (21) PO TBPK: ORAL_TABLET | ORAL | 0 refills | Status: DC

## 2022-01-11 MED ORDER — IPRATROPIUM-ALBUTEROL 0.5-2.5 (3) MG/3ML IN SOLN
3.0000 mL | Freq: Once | RESPIRATORY_TRACT | Status: AC
  Administered 2022-01-11: 3 mL via RESPIRATORY_TRACT
  Filled 2022-01-11: qty 3

## 2022-01-11 MED ORDER — IPRATROPIUM-ALBUTEROL 0.5-2.5 (3) MG/3ML IN SOLN: 3.0000 mL | RESPIRATORY_TRACT | 3 refills | Status: DC | PRN

## 2022-01-11 MED ORDER — DEXAMETHASONE SODIUM PHOSPHATE 10 MG/ML IJ SOLN
10.0000 mg | Freq: Once | INTRAMUSCULAR | Status: AC
  Administered 2022-01-11: 10 mg via INTRAMUSCULAR
  Filled 2022-01-11: qty 1

## 2022-01-11 MED ORDER — DOXYCYCLINE MONOHYDRATE 100 MG PO TABS: 100.0000 mg | ORAL_TABLET | Freq: Two times a day (BID) | ORAL | 0 refills | Status: DC

## 2022-01-11 NOTE — ED Provider Notes (Signed)
[facility] Provider Note    Event Date/Time   First MD Initiated Contact with Patient 01/11/22 1156     (approximate)   History   Cough   HPI  Felicia Woods is a 66 y.o. female presents emergency department complaint cough and congestion that started on Saturday.  No fever or chills.  Patient states feels short of breath with cough.  Took a home COVID test which was negative.  States she has multiple heart problems.  No swelling in the legs, no chest pain      Physical Exam   Triage Vital Signs: ED Triage Vitals  Enc Vitals Group     BP 01/11/22 1142 (!) 167/96     Pulse Rate 01/11/22 1142 98     Resp 01/11/22 1142 19     Temp 01/11/22 1142 98.3 F (36.8 C)     Temp src --      SpO2 01/11/22 1142 94 %     Weight 01/11/22 1203 235 lb 14.3 oz (107 kg)     Height 01/11/22 1203 '5\' 7"'$  (1.702 m)     Head Circumference --      Peak Flow --      Pain Score 01/11/22 1142 3     Pain Loc --      Pain Edu? --      Excl. in [place]? --     Most recent vital signs: Vitals:   01/11/22 1142 01/11/22 1422  BP: (!) 167/96 (!) 160/80  Pulse: 98 90  Resp: 19 18  Temp: 98.3 F (36.8 C)   SpO2: 94% 95%     General: Awake, no distress.   CV:  Good peripheral perfusion. regular rate and  rhythm Resp:  Normal effort. Lungs with decreased air movement, wheezing noted Abd:  No distention.   Other:      ED Results / Procedures / Treatments   Labs (all labs ordered are listed, but only abnormal results are displayed) Labs Reviewed - No data to display   EKG     RADIOLOGY Chest x-ray    PROCEDURES:   Procedures   MEDICATIONS ORDERED IN ED: Medications  ipratropium-albuterol (DUONEB) 0.5-2.5 (3) MG/3ML nebulizer solution 3 mL (3 mLs Nebulization Given 01/11/22 1225)  ipratropium-albuterol (DUONEB) 0.5-2.5 (3) MG/3ML nebulizer solution 3 mL (3 mLs Nebulization Given 01/11/22 1255)  ipratropium-albuterol (DUONEB) 0.5-2.5 (3) MG/3ML nebulizer  solution 3 mL (3 mLs Nebulization Given 01/11/22 1344)  dexamethasone (DECADRON) injection 10 mg (10 mg Intramuscular Given 01/11/22 1343)     IMPRESSION / MDM / [place] / ED COURSE  I reviewed the triage vital signs and the nursing notes.                              Differential diagnosis includes, but is not limited to, CAP, acute bronchitis, COVID, influenza, CHF  Patient's presentation is most consistent with acute illness / injury with system symptoms.   Chest x-ray DuoNeb ordered  Chest x-ray independently interpreted by me as being negative for pneumonia or CHF.  Confirmed by radiology  Patient was given 3 DuoNebs.  Nursing staff noted that she was not inhaling the nebulizer treatment correctly so we did use a mask which she had better results with.  Patient was placed on doxycycline and Sterapred.  She also be given a prescription for a nebulizer machine and DuoNeb Nebules.  She was given a work  note to keep her out of work for 3 days.  She is to avoid secondhand cigarette smoke.  Return emergency department if worsening.  Patient is in agreement treatment plan.  She was discharged stable condition.     FINAL CLINICAL IMPRESSION(S) / ED DIAGNOSES   Final diagnoses:  Acute bronchitis, unspecified organism     Rx / DC Orders   ED Discharge Orders          Ordered    doxycycline (ADOXA) 100 MG tablet  2 times daily        01/11/22 1414    predniSONE (STERAPRED UNI-PAK 21 TAB) 10 MG (21) TBPK tablet        01/11/22 1414    ipratropium-albuterol (DUONEB) 0.5-2.5 (3) MG/3ML SOLN  Every 4 hours PRN        01/11/22 1414    For home use only DME Nebulizer machine        01/11/22 1414             Note:  This document was prepared using Dragon voice recognition software and may include unintentional dictation errors.    Versie Starks, PA-C 01/11/22 1433    Naaman Plummer, MD 01/11/22 [phone]

## 2022-01-11 NOTE — ED Triage Notes (Signed)
Pt comes with c/o cough and congestion since this weekend. Pt denies any sore throat or fevers.

## 2022-01-11 NOTE — Discharge Instructions (Signed)
Follow-up with your regular doctor if not improving 3 days.  Return emergency department worsening.  Take medications as prescribed.  You will start the steroid pack tomorrow.  May start your other medicines today

## 2022-01-12 ENCOUNTER — Ambulatory Visit: Payer: Medicare (Managed Care)

## 2022-01-13 ENCOUNTER — Encounter: Payer: Self-pay | Admitting: Nurse Practitioner

## 2022-01-13 ENCOUNTER — Telehealth: Payer: Self-pay

## 2022-01-13 ENCOUNTER — Ambulatory Visit (INDEPENDENT_AMBULATORY_CARE_PROVIDER_SITE_OTHER): Payer: Medicare (Managed Care) | Admitting: Nurse Practitioner

## 2022-01-13 VITALS — BP 133/72 | HR 60 | Temp 98.2°F | Resp 16 | Ht 67.0 in | Wt 235.0 lb

## 2022-01-13 DIAGNOSIS — M159 Polyosteoarthritis, unspecified: Secondary | ICD-10-CM

## 2022-01-13 DIAGNOSIS — R062 Wheezing: Secondary | ICD-10-CM

## 2022-01-13 DIAGNOSIS — R053 Chronic cough: Secondary | ICD-10-CM

## 2022-01-13 DIAGNOSIS — J441 Chronic obstructive pulmonary disease with (acute) exacerbation: Secondary | ICD-10-CM

## 2022-01-13 MED ORDER — ADJUST BATH/SHOWER SEAT MISC: 0 refills | Status: DC

## 2022-01-13 MED ORDER — ALBUTEROL SULFATE HFA 108 (90 BASE) MCG/ACT IN AERS: 2.0000 | INHALATION_SPRAY | Freq: Four times a day (QID) | RESPIRATORY_TRACT | 6 refills | Status: DC | PRN

## 2022-01-13 MED ORDER — TRELEGY ELLIPTA 100-62.5-25 MCG/ACT IN AEPB: 1.0000 | INHALATION_SPRAY | Freq: Every day | RESPIRATORY_TRACT | 11 refills | Status: DC

## 2022-01-13 NOTE — Telephone Encounter (Signed)
Sent message to [facility] to inform nebulizer was given to pt and orders are in epic

## 2022-01-13 NOTE — Telephone Encounter (Signed)
Faxed notes for heavy duty transfer bench with or without commode open to [employer]. At fax number [phone]

## 2022-01-13 NOTE — Progress Notes (Signed)
[facility] [place], [place] [address]  Internal MEDICINE  Office Visit Note  Patient Name: Felicia Woods  [id number]  [id number]  Date of Service: 01/13/2022  Chief Complaint  Patient presents with   Follow-up    Neg covid test   Cough   Asthma    HPI Elara presents for follow-up visit after visiting the emergency department for a cough.  She was told in the ER that she most likely has bronchitis and that has become chronic and she received 3 breathing treatments while she was in the ER.  She was also administered a Depo-Medrol shot and was started on a prednisone taper as well as doxycycline.  The chest x-ray in the ER did not show any significant abnormalities other than mild bibasilar atelectasis which is present in previous chest x-rays as well.  She has had a and an albuterol inhaler in the past but does not have any more right now.  She has the DuoNeb nebulizer solution at home but does not have a nebulizer machine so she needs 1 ordered She is also requesting an adjustable shower seat due to significant arthritis in multiple joints and an increased risk of falling. She is also negative for COVID.  Current Medication: Outpatient Encounter Medications as of 01/13/2022  Medication Sig   Accu-Chek Softclix Lancets lancets Use as instructed twice a day to check blood sugars  DIAG -E11.59   amLODipine (NORVASC) 10 MG tablet Take 1 tablet (10 mg total) by mouth daily.   aspirin EC 81 MG tablet Take 81 mg by mouth daily.   atorvastatin (LIPITOR) 80 MG tablet Take 1 tablet (80 mg total) by mouth daily.   clopidogrel (PLAVIX) 75 MG tablet Take 1 tablet (75 mg total) by mouth daily.   cyclobenzaprine (FLEXERIL) 10 MG tablet Take 1 tablet (10 mg total) by mouth at bedtime.   dapagliflozin propanediol (FARXIGA) 10 MG TABS tablet Take 1 tablet (10 mg total) by mouth daily.   doxycycline (ADOXA) 100 MG tablet Take 1 tablet (100 mg total) by mouth 2 (two) times  daily.   Dulaglutide (TRULICITY) 1.5 QJ/3.3LK SOPN INJECT 1.5MG INTO THE SKIN ONCE A WEEK   famotidine (PEPCID) 20 MG tablet Take 1 tablet by mouth in the morning and at bedtime.   Fluticasone-Umeclidin-Vilant (TRELEGY ELLIPTA) 100-62.5-25 MCG/ACT AEPB Inhale 1 puff into the lungs daily.   furosemide (LASIX) 40 MG tablet Take 1 tablet (40 mg total) by mouth 2 (two) times daily.   glucose blood (ACCU-CHEK GUIDE) test strip Use as instructed to check blood sugars twice a day  E11.59   ipratropium-albuterol (DUONEB) 0.5-2.5 (3) MG/3ML SOLN Take 3 mLs by nebulization every 4 (four) hours as needed.   losartan (COZAAR) 100 MG tablet Take 1 tablet (100 mg total) by mouth daily. Take one tab po qd   methocarbamol (ROBAXIN) 500 MG tablet Take 1 tablet by mouth as needed.   metoprolol succinate (TOPROL-XL) 25 MG 24 hr tablet Take 25 mg by mouth daily.   Misc. Devices (ADJUST BATH/SHOWER SEAT) MISC 1 shower seat for use at home to reduce risk of falling.   montelukast (SINGULAIR) 10 MG tablet Take 1 tablet (10 mg total) by mouth at bedtime.   nitroGLYCERIN (NITROSTAT) 0.4 MG SL tablet DISSOLVE ONE TABLET UNDER THE TONGUE EVERY 5 MINUTES AS NEEDED FOR CHEST PAIN.  DO NOT EXCEED A TOTAL OF 3 DOSES IN 15 MINUTES   oxybutynin (DITROPAN-XL) 5 MG 24 hr tablet Take 1  tablet by mouth at bedtime.   polyethylene glycol-electrolytes (NULYTELY) 420 g solution Prepare according to package instructions. Starting at 5:00 PM: Drink one 8 oz glass of mixture every 15 minutes until you finish half of the jug. Five hours prior to procedure, drink 8 oz glass of mixture every 15 minutes until it is all gone. Make sure you do not drink anything 4 hours prior to your procedure.   predniSONE (STERAPRED UNI-PAK 21 TAB) 10 MG (21) TBPK tablet Take 6 pills on day one then decrease by 1 pill each day   ranolazine (RANEXA) 500 MG 12 hr tablet Take 1 tablet by mouth 2 (two) times daily.   sertraline (ZOLOFT) 100 MG tablet Take 100 mg by  mouth daily.   zolpidem (AMBIEN) 5 MG tablet Take 1 tablet (5 mg total) by mouth at bedtime as needed for sleep.   [DISCONTINUED] albuterol (PROVENTIL HFA;VENTOLIN HFA) 108 (90 Base) MCG/ACT inhaler Inhale 2 puffs into the lungs every 6 (six) hours as needed for wheezing or shortness of breath.   albuterol (VENTOLIN HFA) 108 (90 Base) MCG/ACT inhaler Inhale 2 puffs into the lungs every 6 (six) hours as needed for wheezing or shortness of breath.   isosorbide mononitrate (IMDUR) 60 MG 24 hr tablet Take 1 tablet (60 mg total) by mouth daily.   No facility-administered encounter medications on file as of 01/13/2022.    Surgical History: Past Surgical History:  Procedure Laterality Date   ABDOMINAL HYSTERECTOMY     CARDIAC CATHETERIZATION     CARDIAC CATHETERIZATION N/A 10/18/2015   Procedure: Left Heart Cath and Cors/Grafts Angiography;  Surgeon: Lorretta Harp, MD;  Location: [place] [facility];  Service: Cardiovascular;  Laterality: N/A;   CARDIAC CATHETERIZATION N/A 10/18/2015   Procedure: Coronary Stent Intervention;  Surgeon: Lorretta Harp, MD;  Location: [place] [facility];  Service: Cardiovascular;  Laterality: N/A;   CARDIAC SURGERY     CHOLECYSTECTOMY     COLONOSCOPY WITH PROPOFOL N/A 12/02/2021   Procedure: COLONOSCOPY WITH PROPOFOL;  Surgeon: Jonathon Bellows, MD;  Location: [facility] ENDOSCOPY;  Service: Gastroenterology;  Laterality: N/A;   CORONARY ANGIOPLASTY     CORONARY ARTERY BYPASS GRAFT     4 vessels - 2010   CORONARY STENT INTERVENTION N/A 02/16/2017   Procedure: CORONARY STENT INTERVENTION;  Surgeon: Yolonda Kida, MD;  Location: [place] [facility];  Service: Cardiovascular;  Laterality: N/A;   CORONARY STENT INTERVENTION N/A 02/13/2019   Procedure: CORONARY STENT INTERVENTION;  Surgeon: Nelva Bush, MD;  Location: [place] [facility];  Service: Cardiovascular;  Laterality: N/A;  SVG to RCA   CORONARY STENT INTERVENTION Left 03/08/2019   Procedure: CORONARY STENT  INTERVENTION;  Surgeon: Yolonda Kida, MD;  Location: [place] [facility];  Service: Cardiovascular;  Laterality: Left;   CORONARY STENT INTERVENTION N/A 11/04/2020   Procedure: CORONARY STENT INTERVENTION;  Surgeon: Nelva Bush, MD;  Location: [place] [facility];  Service: Cardiovascular;  Laterality: N/A;   CORONARY STENT INTERVENTION N/A 12/24/2021   Procedure: CORONARY STENT INTERVENTION;  Surgeon: Andrez Grime, MD;  Location: [place] [facility];  Service: Cardiovascular;  Laterality: N/A;   ESOPHAGOGASTRODUODENOSCOPY N/A 12/02/2021   Procedure: ESOPHAGOGASTRODUODENOSCOPY (EGD);  Surgeon: Jonathon Bellows, MD;  Location: [facility] ENDOSCOPY;  Service: Gastroenterology;  Laterality: N/A;   LEFT HEART CATH AND CORONARY ANGIOGRAPHY Left 02/16/2017   Procedure: LEFT HEART CATH AND CORONARY ANGIOGRAPHY;  Surgeon: Corey Skains, MD;  Location: [place] [facility];  Service: Cardiovascular;  Laterality: Left;   LEFT HEART CATH AND CORONARY ANGIOGRAPHY Left 12/24/2021   Procedure: LEFT HEART CATH AND CORONARY ANGIOGRAPHY;  Surgeon: Corey Skains, MD;  Location: [place] [facility];  Service: Cardiovascular;  Laterality: Left;   LEFT HEART CATH AND CORS/GRAFTS ANGIOGRAPHY Left 11/23/2017   Procedure: LEFT HEART CATH AND CORS/GRAFTS ANGIOGRAPHY;  Surgeon: Corey Skains, MD;  Location: [place] [facility];  Service: Cardiovascular;  Laterality: Left;   LEFT HEART CATH AND CORS/GRAFTS ANGIOGRAPHY N/A 02/13/2019   Procedure: LEFT HEART CATH AND CORS/GRAFTS ANGIOGRAPHY;  Surgeon: Corey Skains, MD;  Location: [place] [facility];  Service: Cardiovascular;  Laterality: N/A;   LEFT HEART CATH AND CORS/GRAFTS ANGIOGRAPHY N/A 07/03/2019   Procedure: LEFT HEART CATH AND CORS/GRAFTS ANGIOGRAPHY;  Surgeon: Corey Skains, MD;  Location: [place] [facility];  Service: Cardiovascular;  Laterality: N/A;   LEFT HEART CATH AND CORS/GRAFTS ANGIOGRAPHY N/A 11/04/2020   Procedure: LEFT HEART CATH  AND CORS/GRAFTS ANGIOGRAPHY;  Surgeon: Corey Skains, MD;  Location: [place] [facility];  Service: Cardiovascular;  Laterality: N/A;    Medical History: Past Medical History:  Diagnosis Date   Asthma    Coronary artery disease    Diabetes mellitus without complication ([place])    Heart attack ([place])    Hyperlipidemia    Hypertension    MI (myocardial infarction) ([place])    Migraine headache with aura    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    Family History: Family History  Problem Relation Age of Onset   Diabetes Mother    Diabetes Father    Cancer Father    Diabetes Brother     Social History   Socioeconomic History   Marital status: Widowed    Spouse name: Jeneen Rinks   Number of children: Not on file   Years of education: Not on file   Highest education level: Not on file  Occupational History   Not on file  Tobacco Use   Smoking status: Never   Smokeless tobacco: Never  Vaping Use   Vaping Use: Never used  Substance and Sexual Activity   Alcohol use: No    Alcohol/week: 0.0 standard drinks of alcohol   Drug use: No   Sexual activity: Yes  Other Topics Concern   Not on file  Social History Narrative   Not on file   Social Determinants of Health   Financial Resource Strain: Not on file  Food Insecurity: Not on file  Transportation Needs: Not on file  Physical Activity: Not on file  Stress: Not on file  Social Connections: Not on file  Intimate Partner Violence: Not on file      Review of Systems  Constitutional:  Positive for fatigue. Negative for chills and unexpected weight change.  HENT:  Negative for congestion, postnasal drip, rhinorrhea, sneezing and sore throat.   Eyes:  Negative for redness.  Respiratory:  Positive for cough, chest tightness, shortness of breath and wheezing.   Cardiovascular: Negative.  Negative for chest pain and palpitations.  Gastrointestinal:  Negative for abdominal pain, constipation, diarrhea, nausea and vomiting.   Genitourinary:  Negative for dysuria and frequency.  Musculoskeletal:  Negative for arthralgias, back pain, joint swelling and neck pain.  Skin:  Negative for rash.  Neurological: Negative.  Negative for tremors and numbness.  Hematological:  Negative for adenopathy. Does not bruise/bleed easily.  Psychiatric/Behavioral:  Negative for behavioral problems (Depression), sleep disturbance and suicidal ideas. The patient is not nervous/anxious.  Vital Signs: BP 133/72   Pulse (!) 57   Temp 98.2 F (36.8 C)   Resp 16   Ht '5\' 7"'  (1.702 m)   Wt 235 lb (106.6 kg)   SpO2 97%   BMI 36.81 kg/m    Physical Exam Vitals reviewed.  Constitutional:      General: She is not in acute distress.    Appearance: Normal appearance. She is obese. She is ill-appearing.  HENT:     Head: Normocephalic and atraumatic.     Mouth/Throat:     Mouth: Mucous membranes are moist.     Pharynx: Oropharynx is clear.  Eyes:     Pupils: Pupils are equal, round, and reactive to light.  Cardiovascular:     Rate and Rhythm: Normal rate and regular rhythm.     Heart sounds: Normal heart sounds, S1 normal and S2 normal.  Pulmonary:     Effort: No accessory muscle usage or respiratory distress.     Breath sounds: Normal air entry. Examination of the right-upper field reveals decreased breath sounds and wheezing. Examination of the left-upper field reveals decreased breath sounds and wheezing. Examination of the right-middle field reveals wheezing. Examination of the left-middle field reveals wheezing. Examination of the right-lower field reveals decreased breath sounds and rales. Examination of the left-lower field reveals decreased breath sounds and rales. Decreased breath sounds, wheezing and rales (slight) present.  Chest:     Chest wall: No tenderness.  Musculoskeletal:     Right lower leg: No edema.     Left lower leg: No edema.  Neurological:     Mental Status: She is alert and oriented to person, place,  and time.  Psychiatric:        Mood and Affect: Mood normal.        Behavior: Behavior normal.        Assessment/Plan: 1. Chronic obstructive pulmonary disease with acute exacerbation (HCC) Nebulizer machine ordered, AHP notified.  Albuterol inhaler refills ordered.  4-week sample of Trelegy Ellipta provided to patient in office and patient self administered first dose in the office.  Refills of Trelegy were sent to the pharmacy.  PFT and CT chest ordered for further evaluation.  Patient will follow-up in 4 weeks - For home use only DME Nebulizer machine - albuterol (VENTOLIN HFA) 108 (90 Base) MCG/ACT inhaler; Inhale 2 puffs into the lungs every 6 (six) hours as needed for wheezing or shortness of breath.  Dispense: 8 g; Refill: 6 - Fluticasone-Umeclidin-Vilant (TRELEGY ELLIPTA) 100-62.5-25 MCG/ACT AEPB; Inhale 1 puff into the lungs daily.  Dispense: 1 each; Refill: 11 - Pulmonary function test; Future - CT Chest Wo Contrast; Future  2. Primary osteoarthritis involving multiple joints Prescription provided to the patient for a shower seat and she will take that to [facility]. Devices (ADJUST BATH/SHOWER SEAT) MISC; 1 shower seat for use at home to reduce risk of falling.  Dispense: 1 each; Refill: 0  3. Persistent cough Patient has been dealing with a persistent cough and bronchitis that has become chronic, additional imaging and testing ordered for further evaluation - albuterol (VENTOLIN HFA) 108 (90 Base) MCG/ACT inhaler; Inhale 2 puffs into the lungs every 6 (six) hours as needed for wheezing or shortness of breath.  Dispense: 8 g; Refill: 6 - Pulmonary function test; Future - CT Chest Wo Contrast; Future  4. Wheezing Nebulizer machine ordered and provided to the patient in the office today, albuterol inhaler refills ordered, PFT and CT chest  ordered for further evaluation - For home use only DME Nebulizer machine - albuterol (VENTOLIN HFA) 108 (90 Base)  MCG/ACT inhaler; Inhale 2 puffs into the lungs every 6 (six) hours as needed for wheezing or shortness of breath.  Dispense: 8 g; Refill: 6 - Pulmonary function test; Future - CT Chest Wo Contrast; Future   General Counseling: Saadia verbalizes understanding of the findings of todays visit and agrees with plan of treatment. I have discussed any further diagnostic evaluation that may be needed or ordered today. We also reviewed her medications today. she has been encouraged to call the office with any questions or concerns that should arise related to todays visit.    Orders Placed This Encounter  Procedures   For home use only DME Nebulizer machine    Meds ordered this encounter  Medications   albuterol (VENTOLIN HFA) 108 (90 Base) MCG/ACT inhaler    Sig: Inhale 2 puffs into the lungs every 6 (six) hours as needed for wheezing or shortness of breath.    Dispense:  8 g    Refill:  6   Fluticasone-Umeclidin-Vilant (TRELEGY ELLIPTA) 100-62.5-25 MCG/ACT AEPB    Sig: Inhale 1 puff into the lungs daily.    Dispense:  1 each    Refill:  11   Misc. Devices (ADJUST BATH/SHOWER SEAT) MISC    Sig: 1 shower seat for use at home to reduce risk of falling.    Dispense:  1 each    Refill:  0    Return in about 4 weeks (around 02/10/2022) for F/U, eval new med trelegy with Lauren PA-C.   Total time spent:30 Minutes Time spent includes review of chart, medications, test results, and follow up plan with the patient.   [place] Controlled Substance Database was reviewed by me.  This patient was seen by Jonetta Osgood, FNP-C in collaboration with Dr. Clayborn Bigness as a part of collaborative care agreement.   Holli Rengel R. Valetta Fuller, MSN, FNP-C Internal medicine

## 2022-01-15 ENCOUNTER — Ambulatory Visit: Payer: Medicare (Managed Care) | Admitting: Physician Assistant

## 2022-01-18 DIAGNOSIS — H04123 Dry eye syndrome of bilateral lacrimal glands: Secondary | ICD-10-CM | POA: Diagnosis not present

## 2022-01-18 DIAGNOSIS — H35033 Hypertensive retinopathy, bilateral: Secondary | ICD-10-CM | POA: Diagnosis not present

## 2022-01-18 DIAGNOSIS — H52223 Regular astigmatism, bilateral: Secondary | ICD-10-CM | POA: Diagnosis not present

## 2022-01-18 DIAGNOSIS — E119 Type 2 diabetes mellitus without complications: Secondary | ICD-10-CM | POA: Diagnosis not present

## 2022-01-18 DIAGNOSIS — H01022 Squamous blepharitis right lower eyelid: Secondary | ICD-10-CM | POA: Diagnosis not present

## 2022-01-18 DIAGNOSIS — H5203 Hypermetropia, bilateral: Secondary | ICD-10-CM | POA: Diagnosis not present

## 2022-01-18 DIAGNOSIS — Z7984 Long term (current) use of oral hypoglycemic drugs: Secondary | ICD-10-CM | POA: Diagnosis not present

## 2022-01-18 DIAGNOSIS — H524 Presbyopia: Secondary | ICD-10-CM | POA: Diagnosis not present

## 2022-01-18 DIAGNOSIS — H2513 Age-related nuclear cataract, bilateral: Secondary | ICD-10-CM | POA: Diagnosis not present

## 2022-01-19 ENCOUNTER — Encounter: Payer: Medicare (Managed Care) | Attending: Cardiology | Admitting: *Deleted

## 2022-01-19 ENCOUNTER — Other Ambulatory Visit (HOSPITAL_COMMUNITY)
Admission: RE | Admit: 2022-01-19 | Discharge: 2022-01-19 | Disposition: A | Discharge status: Home / Self Care | Payer: Medicare (Managed Care) | Source: Ambulatory Visit | Attending: Obstetrics | Admitting: Obstetrics

## 2022-01-19 ENCOUNTER — Ambulatory Visit (INDEPENDENT_AMBULATORY_CARE_PROVIDER_SITE_OTHER): Payer: Medicare (Managed Care) | Admitting: Obstetrics

## 2022-01-19 VITALS — BP 126/84

## 2022-01-19 DIAGNOSIS — B3731 Acute candidiasis of vulva and vagina: Secondary | ICD-10-CM | POA: Diagnosis not present

## 2022-01-19 DIAGNOSIS — N898 Other specified noninflammatory disorders of vagina: Secondary | ICD-10-CM | POA: Diagnosis not present

## 2022-01-19 DIAGNOSIS — M159 Polyosteoarthritis, unspecified: Secondary | ICD-10-CM | POA: Diagnosis not present

## 2022-01-19 DIAGNOSIS — Z955 Presence of coronary angioplasty implant and graft: Secondary | ICD-10-CM

## 2022-01-19 MED ORDER — FLUCONAZOLE 150 MG PO TABS: 150.0000 mg | ORAL_TABLET | ORAL | 0 refills | Status: DC

## 2022-01-19 NOTE — Progress Notes (Signed)
[name] is a 66 y.o. [phone] who LMP was No LMP recorded. Patient has had a hysterectomy., presents today for a problem visit.   Patient complains of an abnormal vaginal discharge for 7 days. Discharge described as: copious, white, and watery. Vaginal symptoms include burning, local irritation, and vulvar itching.   Other associated symptoms: none.Menstrual pattern:   She admits to recent antibiotic exposure, denies changes in soaps, detergents coinciding with the onset of her symptoms.  She has not previously self treated or been under treatment by another provider for these symptoms.   BP 126/84  Review of Systems  Constitutional: Negative.   Eyes: Negative.   Respiratory:  Positive for cough.        Recently treated for respiratory infection.  Cardiovascular: Negative.   Gastrointestinal: Negative.   Genitourinary: Negative.   Musculoskeletal: Negative.   Skin:  Positive for itching.       Intense vulvar itching since starting on antibiotics.  Neurological: Negative.   Endo/Heme/Allergies: Negative.   Psychiatric/Behavioral: Negative.     Physical Exam Constitutional:      Appearance: Normal appearance. She is obese.  HENT:     Head: Normocephalic and atraumatic.  Cardiovascular:     Rate and Rhythm: Normal rate and regular rhythm.  Pulmonary:     Effort: Pulmonary effort is normal.     Breath sounds: Normal breath sounds.  Abdominal:     Palpations: Abdomen is soft.  Genitourinary:    General: Normal vulva.     Vagina: Vaginal discharge present.     Rectum: Normal.     Comments: No external lesions noted. normal hair distribution. Watery clear vaginal discharge noted. Spec exam: clear vaginl discharge noted. Vaginal walls are slightly ruddy in color. Aptima swab retrieved. Skin:    General: Skin is warm and dry.  Neurological:     Mental Status: She is alert.  Psychiatric:        Mood and Affect: Mood normal.        Behavior: Behavior normal.      1)  Risk factors for bacterial vaginosis and candida infections discussed.  We discussed normal vaginal flora/microbiome.  Any factors that may alter the microbiome increase the risk of these opportunistic infections.  These include changes in pH, antibiotic exposures, diabetes, wet bathing suits etc.  We discussed that treatment is aimed at eradicating abnormal bacterial overgrowth and or yeast.  There may be some role for vaginal probiotics in restoring normal vaginal flora.     Aptima swab sent today. IN the meantime I have prescribed her some Diflucan tabs. She is also encouraged to get some Monistat cream and apply this externally for comfort. I will reach out with the lab results.  F/U PRN.  Time spent in direct patient care, review of chart and ordering of tests and labs  is 30 minutes.

## 2022-01-19 NOTE — Progress Notes (Signed)
Virtual orientation call completed today. shehas an appointment on Date: 01/26/2022  for EP eval and gym Orientation.  Documentation of diagnosis can be found in [facility] Date: 12/24/2021 .

## 2022-01-21 LAB — CERVICOVAGINAL ANCILLARY ONLY
Bacterial Vaginitis (gardnerella): NEGATIVE
Candida Glabrata: NEGATIVE
Candida Vaginitis: POSITIVE — AB
Comment: NEGATIVE
Comment: NEGATIVE
Comment: NEGATIVE

## 2022-01-26 ENCOUNTER — Ambulatory Visit (INDEPENDENT_AMBULATORY_CARE_PROVIDER_SITE_OTHER): Payer: Medicare (Managed Care) | Admitting: Gastroenterology

## 2022-01-26 ENCOUNTER — Ambulatory Visit: Payer: Medicare (Managed Care)

## 2022-01-26 ENCOUNTER — Encounter: Payer: Self-pay | Admitting: Gastroenterology

## 2022-01-26 VITALS — BP 157/92 | HR 74 | Temp 97.8°F | Wt 233.8 lb

## 2022-01-26 DIAGNOSIS — Z8619 Personal history of other infectious and parasitic diseases: Secondary | ICD-10-CM | POA: Diagnosis not present

## 2022-01-26 DIAGNOSIS — R159 Full incontinence of feces: Secondary | ICD-10-CM

## 2022-01-26 DIAGNOSIS — E538 Deficiency of other specified B group vitamins: Secondary | ICD-10-CM | POA: Diagnosis not present

## 2022-01-26 MED ORDER — LOPERAMIDE HCL 2 MG PO TABS: 4.0000 mg | ORAL_TABLET | Freq: Three times a day (TID) | ORAL | 0 refills | Status: DC

## 2022-01-26 NOTE — Patient Instructions (Addendum)
You are referred for pelvic floor incontinence therapy. They will call you to schedule an appointment.  [facility] [place]  [address], [place] [id number]

## 2022-01-26 NOTE — Progress Notes (Signed)
Jonathon Bellows MD, MRCP(U.K) [address]  [place]  [place], [place] [address]  Main: [phone]  Fax: [phone]   Primary Care Physician: Mylinda Latina, PA-C  Primary Gastroenterologist:  Dr. Jonathon Bellows   Chief Complaint  Patient presents with   incontinence of feces    HPI: Felicia Woods is a 66 y.o. female   Summary of history :  Seen in 11/2021 for fecal incontinence ongoing for a few months . Has noticed that she leaks stool often before she can reach the restroom or when she coughs along with urine as well.  She has had 3 children by vaginal deliveries first when she was in prolonged labor.  Lost about 10 pounds of weight unintentionally recently no upper abdominal discomfort no change in bowel habits.  No blood in the stool.  Denies any bulging sensation per vagina.  Denies any nosebleeds blood in the urine or any hematemesis.    Interval history   11/24/2021-01/26/2022  11/27/2021: Ferritin, iron studies,urine, normal ,   H pylori breath test positive: Commenced on Quadruple therapy with bismuth   12/02/2021:colonoscopy : Poor anal tone, single 5 mm polyp resected. EGD: 10 mm gastric polyp taken out.Benign polyp  Weight stable since last visit She states that she continues to have fecal incontinence and has a bowel movement without her knowledge also has dribbling of urine.  She recollects she was in labor for 14 hours during her first pregnancy and had perineal tear.  Not taking any Imodium at this point of time.  Current Outpatient Medications  Medication Sig Dispense Refill   Accu-Chek Softclix Lancets lancets Use as instructed twice a day to check blood sugars  DIAG -E11.59 100 each 3   albuterol (VENTOLIN HFA) 108 (90 Base) MCG/ACT inhaler Inhale 2 puffs into the lungs every 6 (six) hours as needed for wheezing or shortness of breath. 8 g 6   amLODipine (NORVASC) 10 MG tablet Take 1 tablet (10 mg total) by mouth daily. 90 tablet 1   aspirin EC 81 MG  tablet Take 81 mg by mouth daily.     atorvastatin (LIPITOR) 80 MG tablet Take 1 tablet (80 mg total) by mouth daily. 30 tablet 2   clopidogrel (PLAVIX) 75 MG tablet Take 1 tablet (75 mg total) by mouth daily. 90 tablet 3   cyclobenzaprine (FLEXERIL) 10 MG tablet Take 1 tablet (10 mg total) by mouth at bedtime. 30 tablet 1   dapagliflozin propanediol (FARXIGA) 10 MG TABS tablet Take 1 tablet (10 mg total) by mouth daily. 90 tablet 1   doxycycline (ADOXA) 100 MG tablet Take 1 tablet (100 mg total) by mouth 2 (two) times daily. 20 tablet 0   Dulaglutide (TRULICITY) 1.5 NL/9.7QB SOPN INJECT 1.'5MG'$  INTO THE SKIN ONCE A WEEK 12 mL 1   famotidine (PEPCID) 20 MG tablet Take 1 tablet by mouth in the morning and at bedtime.     Fluticasone-Umeclidin-Vilant (TRELEGY ELLIPTA) 100-62.5-25 MCG/ACT AEPB Inhale 1 puff into the lungs daily. 1 each 11   furosemide (LASIX) 40 MG tablet Take 1 tablet (40 mg total) by mouth 2 (two) times daily. 180 tablet 1   glucose blood (ACCU-CHEK GUIDE) test strip Use as instructed to check blood sugars twice a day  E11.59 100 each 3   ipratropium-albuterol (DUONEB) 0.5-2.5 (3) MG/3ML SOLN Take 3 mLs by nebulization every 4 (four) hours as needed. 360 mL 3   isosorbide mononitrate (IMDUR) 60 MG 24 hr tablet Take 1 tablet  by mouth daily.     losartan (COZAAR) 100 MG tablet Take 1 tablet (100 mg total) by mouth daily. Take one tab po qd 90 tablet 1   Misc. Devices (ADJUST BATH/SHOWER SEAT) MISC 1 shower seat for use at home to reduce risk of falling. 1 each 0   montelukast (SINGULAIR) 10 MG tablet Take 1 tablet (10 mg total) by mouth at bedtime. 90 tablet 3   nitroGLYCERIN (NITROSTAT) 0.4 MG SL tablet DISSOLVE ONE TABLET UNDER THE TONGUE EVERY 5 MINUTES AS NEEDED FOR CHEST PAIN.  DO NOT EXCEED A TOTAL OF 3 DOSES IN 15 MINUTES 30 tablet 3   oxybutynin (DITROPAN-XL) 5 MG 24 hr tablet Take 1 tablet by mouth at bedtime.     sertraline (ZOLOFT) 100 MG tablet Take 100 mg by mouth daily.      zolpidem (AMBIEN) 5 MG tablet Take 1 tablet (5 mg total) by mouth at bedtime as needed for sleep. 15 tablet 1   No current facility-administered medications for this visit.    Allergies as of 01/26/2022 - Review Complete 01/26/2022  Allergen Reaction Noted   Penicillin g Anaphylaxis and Other (See Comments) 02/10/2015    ROS:  General: Negative for anorexia, weight loss, fever, chills, fatigue, weakness. ENT: Negative for hoarseness, difficulty swallowing , nasal congestion. CV: Negative for chest pain, angina, palpitations, dyspnea on exertion, peripheral edema.  Respiratory: Negative for dyspnea at rest, dyspnea on exertion, cough, sputum, wheezing.  GI: See history of present illness. GU:  Negative for dysuria, hematuria, urinary incontinence, urinary frequency, nocturnal urination.  Endo: Negative for unusual weight change.    Physical Examination:   BP (!) 157/92   Pulse 74   Temp 97.8 F (36.6 C) (Oral)   Wt 233 lb 12.8 oz (106.1 kg)   BMI 36.62 kg/m   General: Well-nourished, well-developed in no acute distress.  Eyes: No icterus. Conjunctivae pink. Neuro: Alert and oriented x 3.  Grossly intact. Skin: Warm and dry, no jaundice.   Psych: Alert and cooperative, normal mood and affect.   Imaging Studies: DG Chest 2 View  Result Date: 01/11/2022 CLINICAL DATA:  Cough EXAM: CHEST - 2 VIEW COMPARISON:  Chest x-ray dated June 09, 2021 FINDINGS: Stable cardiac and mediastinal contours post median sternotomy. Mild bibasilar atelectasis, lungs otherwise clear. No pleural effusion or pneumothorax. IMPRESSION: No active cardiopulmonary disease. Electronically Signed   By: Yetta Glassman M.D.   On: 01/11/2022 12:42    Assessment and Plan:   Felicia Woods is a 66 y.o. y/o female here to follow up for fecal incontinence, recently performed a colonoscopy and during the procedure I noted she had very poor anal tone.  Otherwise no luminal abnormalities.  She had a history  of urinary incontinence in addition has issues with fecal incontinence, prolonged labor during her first pregnancy and concern for a perineal tear.  I am not sure if she has a uterine/bladder/rectal prolapse contributing to this.  Back issues could also be contributing to this.  Recently treated for H. pylori.   Plan  B12 deficiency - needs injections  H. pylori breath test positive status post treatment with quadruple therapy PRN imodium, will send in a prescription Await MRI spine which has been ordered but not yet obtained She has lost about 10 pounds of weight.  At her initial visit but since then weight has been stable I will email her GYN MD to inquire if she has been evaluated for rectocele or bladder prolapse and if  evaluation would help. I will refer her to physical therapy for pelvic floor exercises.  If not doing any better she may need evaluation with anorectal manometry at [facility] or [facility]  Dr Jonathon Bellows  MD,MRCP [facility]) Follow up in 6 weeks

## 2022-01-27 ENCOUNTER — Ambulatory Visit
Admission: RE | Admit: 2022-01-27 | Discharge: 2022-01-27 | Disposition: A | Discharge status: Home / Self Care | Payer: Medicare (Managed Care) | Source: Ambulatory Visit | Attending: Nurse Practitioner | Admitting: Nurse Practitioner

## 2022-01-27 DIAGNOSIS — R053 Chronic cough: Secondary | ICD-10-CM | POA: Diagnosis not present

## 2022-01-27 DIAGNOSIS — R062 Wheezing: Secondary | ICD-10-CM | POA: Diagnosis not present

## 2022-01-27 DIAGNOSIS — J441 Chronic obstructive pulmonary disease with (acute) exacerbation: Secondary | ICD-10-CM | POA: Insufficient documentation

## 2022-01-27 DIAGNOSIS — I517 Cardiomegaly: Secondary | ICD-10-CM | POA: Diagnosis not present

## 2022-01-27 DIAGNOSIS — I700 Atherosclerosis of aorta: Secondary | ICD-10-CM | POA: Diagnosis not present

## 2022-01-27 DIAGNOSIS — I2510 Atherosclerotic heart disease of native coronary artery without angina pectoris: Secondary | ICD-10-CM | POA: Diagnosis not present

## 2022-01-27 DIAGNOSIS — J45909 Unspecified asthma, uncomplicated: Secondary | ICD-10-CM | POA: Diagnosis not present

## 2022-01-28 ENCOUNTER — Ambulatory Visit: Payer: Medicare (Managed Care)

## 2022-01-28 DIAGNOSIS — Z955 Presence of coronary angioplasty implant and graft: Secondary | ICD-10-CM | POA: Diagnosis not present

## 2022-01-28 DIAGNOSIS — I700 Atherosclerosis of aorta: Secondary | ICD-10-CM | POA: Diagnosis not present

## 2022-01-28 DIAGNOSIS — I10 Essential (primary) hypertension: Secondary | ICD-10-CM | POA: Diagnosis not present

## 2022-01-28 DIAGNOSIS — I25810 Atherosclerosis of coronary artery bypass graft(s) without angina pectoris: Secondary | ICD-10-CM | POA: Diagnosis not present

## 2022-01-28 LAB — H. PYLORI BREATH TEST: H pylori Breath Test: POSITIVE — AB

## 2022-01-29 ENCOUNTER — Other Ambulatory Visit: Payer: Self-pay | Admitting: Obstetrics

## 2022-01-29 ENCOUNTER — Ambulatory Visit: Payer: Medicare (Managed Care)

## 2022-02-02 ENCOUNTER — Other Ambulatory Visit: Payer: Self-pay | Admitting: Physician Assistant

## 2022-02-02 DIAGNOSIS — G4709 Other insomnia: Secondary | ICD-10-CM

## 2022-02-03 ENCOUNTER — Other Ambulatory Visit: Payer: Self-pay

## 2022-02-04 NOTE — Progress Notes (Signed)
Inform  h pylori persists   Rx with   PPI  20 mg twice daily 14 days  Clarithromycin (500 mg) Twice daily  Bismuth subsalicylate (504 or 136 mg)[1] Four times daily  Doxy cycline  100 mg BID for 14 days  Recheck for eradication after 6 weeks

## 2022-02-05 ENCOUNTER — Telehealth: Payer: Self-pay

## 2022-02-05 DIAGNOSIS — Z8619 Personal history of other infectious and parasitic diseases: Secondary | ICD-10-CM

## 2022-02-05 MED ORDER — CLARITHROMYCIN 500 MG PO TABS: 500.0000 mg | ORAL_TABLET | Freq: Two times a day (BID) | ORAL | 0 refills | Status: DC

## 2022-02-05 MED ORDER — BISMUTH SUBSALICYLATE 262 MG/15ML PO SUSP: 30.0000 mL | Freq: Four times a day (QID) | ORAL | 0 refills | Status: AC | PRN

## 2022-02-05 MED ORDER — OMEPRAZOLE 20 MG PO CPDR: 20.0000 mg | DELAYED_RELEASE_CAPSULE | Freq: Two times a day (BID) | ORAL | 0 refills | Status: DC

## 2022-02-05 MED ORDER — DOXYCYCLINE HYCLATE 100 MG PO TABS: 100.0000 mg | ORAL_TABLET | Freq: Two times a day (BID) | ORAL | 0 refills | Status: AC

## 2022-02-05 NOTE — Telephone Encounter (Signed)
-----   Message from Jonathon Bellows, MD sent at 02/04/2022 11:42 PM EDT ----- Inform  h pylori persists   Rx with   PPI  20 mg twice daily 14 days  Clarithromycin (500 mg) Twice daily  Bismuth subsalicylate (438 or 887 mg)[1] Four times daily  Doxy cycline  100 mg BID for 14 days  Recheck for eradication after 6 weeks

## 2022-02-05 NOTE — Telephone Encounter (Signed)
Called patient to let her know that she is still H Pylori positive, therefore, she is to start taking antibiotics again. I also informed her that 6 weeks after she is done with her antibiotics, she will have to come to the office and have her H Pylori rechecked.

## 2022-02-08 ENCOUNTER — Encounter: Payer: Medicare (Managed Care) | Attending: Cardiology | Admitting: *Deleted

## 2022-02-08 VITALS — Ht 65.75 in | Wt 234.1 lb

## 2022-02-08 DIAGNOSIS — Z955 Presence of coronary angioplasty implant and graft: Secondary | ICD-10-CM | POA: Diagnosis not present

## 2022-02-08 NOTE — Progress Notes (Signed)
Cardiac Individual Treatment Plan  Patient Details  Name: Felicia Woods MRN: [id number] Date of Birth: 10-21-1955 Referring Provider:   Flowsheet Row Cardiac Rehab from 02/08/2022 in [facility] Cardiac and Pulmonary Rehab  Referring Provider Corky Sox       Initial Encounter Date:  Flowsheet Row Cardiac Rehab from 02/08/2022 in [facility] Cardiac and Pulmonary Rehab  Date 02/08/22       Visit Diagnosis: Status post coronary artery stent placement  S/P coronary artery stent placement  Patient's Home Medications on Admission:  Current Outpatient Medications:    Accu-Chek Softclix Lancets lancets, Use as instructed twice a day to check blood sugars  DIAG -E11.59, Disp: 100 each, Rfl: 3   albuterol (VENTOLIN HFA) 108 (90 Base) MCG/ACT inhaler, Inhale 2 puffs into the lungs every 6 (six) hours as needed for wheezing or shortness of breath., Disp: 8 g, Rfl: 6   amLODipine (NORVASC) 10 MG tablet, Take 1 tablet (10 mg total) by mouth daily., Disp: 90 tablet, Rfl: 1   aspirin EC 81 MG tablet, Take 81 mg by mouth daily., Disp: , Rfl:    atorvastatin (LIPITOR) 80 MG tablet, Take 1 tablet (80 mg total) by mouth daily., Disp: 30 tablet, Rfl: 2   bismuth subsalicylate (PEPTO BISMOL) 262 MG/15ML suspension, Take 30 mLs by mouth every 6 (six) hours as needed for up to 14 days., Disp: 360 mL, Rfl: 0   clarithromycin (BIAXIN) 500 MG tablet, Take 1 tablet (500 mg total) by mouth 2 (two) times daily., Disp: 28 tablet, Rfl: 0   clopidogrel (PLAVIX) 75 MG tablet, Take 1 tablet (75 mg total) by mouth daily., Disp: 90 tablet, Rfl: 3   cyclobenzaprine (FLEXERIL) 10 MG tablet, Take 1 tablet (10 mg total) by mouth at bedtime., Disp: 30 tablet, Rfl: 1   dapagliflozin propanediol (FARXIGA) 10 MG TABS tablet, Take 1 tablet (10 mg total) by mouth daily., Disp: 90 tablet, Rfl: 1   doxycycline (ADOXA) 100 MG tablet, Take 1 tablet (100 mg total) by mouth 2 (two) times daily., Disp: 20 tablet, Rfl: 0   doxycycline (VIBRA-TABS) 100  MG tablet, Take 1 tablet (100 mg total) by mouth 2 (two) times daily for 14 days., Disp: 28 tablet, Rfl: 0   Dulaglutide (TRULICITY) 1.5 NI/7.7OE SOPN, INJECT 1.5MG INTO THE SKIN ONCE A WEEK, Disp: 12 mL, Rfl: 1   famotidine (PEPCID) 20 MG tablet, Take 1 tablet by mouth in the morning and at bedtime., Disp: , Rfl:    Fluticasone-Umeclidin-Vilant (TRELEGY ELLIPTA) 100-62.5-25 MCG/ACT AEPB, Inhale 1 puff into the lungs daily., Disp: 1 each, Rfl: 11   furosemide (LASIX) 40 MG tablet, Take 1 tablet (40 mg total) by mouth 2 (two) times daily., Disp: 180 tablet, Rfl: 1   glucose blood (ACCU-CHEK GUIDE) test strip, Use as instructed to check blood sugars twice a day  E11.59, Disp: 100 each, Rfl: 3   ipratropium-albuterol (DUONEB) 0.5-2.5 (3) MG/3ML SOLN, Take 3 mLs by nebulization every 4 (four) hours as needed., Disp: 360 mL, Rfl: 3   isosorbide mononitrate (IMDUR) 60 MG 24 hr tablet, Take 1 tablet by mouth daily., Disp: , Rfl:    loperamide (IMODIUM A-D) 2 MG tablet, Take 2 tablets (4 mg total) by mouth in the morning, at noon, and at bedtime., Disp: 540 tablet, Rfl: 0   losartan (COZAAR) 100 MG tablet, Take 1 tablet (100 mg total) by mouth daily. Take one tab po qd, Disp: 90 tablet, Rfl: 1   Misc. Devices (ADJUST BATH/SHOWER SEAT) MISC, 1  shower seat for use at home to reduce risk of falling., Disp: 1 each, Rfl: 0   montelukast (SINGULAIR) 10 MG tablet, Take 1 tablet (10 mg total) by mouth at bedtime., Disp: 90 tablet, Rfl: 3   nitroGLYCERIN (NITROSTAT) 0.4 MG SL tablet, DISSOLVE ONE TABLET UNDER THE TONGUE EVERY 5 MINUTES AS NEEDED FOR CHEST PAIN.  DO NOT EXCEED A TOTAL OF 3 DOSES IN 15 MINUTES, Disp: 30 tablet, Rfl: 3   omeprazole (PRILOSEC) 20 MG capsule, Take 1 capsule (20 mg total) by mouth 2 (two) times daily before a meal., Disp: 28 capsule, Rfl: 0   oxybutynin (DITROPAN-XL) 5 MG 24 hr tablet, Take 1 tablet by mouth at bedtime., Disp: , Rfl:    sertraline (ZOLOFT) 100 MG tablet, Take 100 mg by  mouth daily., Disp: , Rfl:    zolpidem (AMBIEN) 5 MG tablet, TAKE 1 TABLET BY MOUTH AT BEDTIME AS NEEDED FOR SLEEP, Disp: 15 tablet, Rfl: 0  Past Medical History: Past Medical History:  Diagnosis Date   Asthma    Coronary artery disease    Diabetes mellitus without complication ([place])    Heart attack ([place])    Hyperlipidemia    Hypertension    MI (myocardial infarction) ([place])    Migraine headache with aura    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    Tobacco Use: Social History   Tobacco Use  Smoking Status Never  Smokeless Tobacco Never    Labs: Review Flowsheet  More data exists      Latest Ref Rng & Units 02/19/2021 06/10/2021 09/03/2021 09/11/2021 12/03/2021  Labs for ITP Cardiac and Pulmonary Rehab  Cholestrol 0 - 200 mg/dL - 181  - 243  -  LDL (calc) 0 - 99 mg/dL - 125  - 181  -  HDL-C >40 mg/dL - 32  - 50  -  Trlycerides <150 mg/dL - 121  - 62  -  Hemoglobin A1c 4.0 - 5.6 % 7.0  8.2  7.1  - 7.2      Exercise Target Goals: Exercise Program Goal: Individual exercise prescription set using results from initial 6 min walk test and THRR while considering  patient's activity barriers and safety.   Exercise Prescription Goal: Initial exercise prescription builds to 30-45 minutes a day of aerobic activity, 2-3 days per week.  Home exercise guidelines will be given to patient during program as part of exercise prescription that the participant will acknowledge.   Education: Aerobic Exercise: - Group verbal and visual presentation on the components of exercise prescription. Introduces F.I.T.T principle from ACSM for exercise prescriptions.  Reviews F.I.T.T. principles of aerobic exercise including progression. Written material given at graduation. Flowsheet Row Cardiac Rehab from 02/08/2022 in [facility] Cardiac and Pulmonary Rehab  Education need identified 02/08/22       Education: Resistance Exercise: - Group verbal and visual presentation on the components of exercise  prescription. Introduces F.I.T.T principle from ACSM for exercise prescriptions  Reviews F.I.T.T. principles of resistance exercise including progression. Written material given at graduation.    Education: Exercise & Equipment Safety: - Individual verbal instruction and demonstration of equipment use and safety with use of the equipment. Flowsheet Row Cardiac Rehab from 02/08/2022 in [facility] Cardiac and Pulmonary Rehab  Date 02/08/22  Educator [facility]  Instruction Review Code 1- Verbalizes Understanding       Education: Exercise Physiology & General Exercise Guidelines: - Group verbal and written instruction with models to review the exercise physiology of the cardiovascular system  and associated critical values. Provides general exercise guidelines with specific guidelines to those with heart or lung disease.    Education: Flexibility, Balance, Mind/Body Relaxation: - Group verbal and visual presentation with interactive activity on the components of exercise prescription. Introduces F.I.T.T principle from ACSM for exercise prescriptions. Reviews F.I.T.T. principles of flexibility and balance exercise training including progression. Also discusses the mind body connection.  Reviews various relaxation techniques to help reduce and manage stress (i.e. Deep breathing, progressive muscle relaxation, and visualization). Balance handout provided to take home. Written material given at graduation.   Activity Barriers & Risk Stratification:  Activity Barriers & Cardiac Risk Stratification - 01/19/22 0907       Activity Barriers & Cardiac Risk Stratification   Activity Barriers Arthritis;Joint Problems;Shortness of Breath    Cardiac Risk Stratification High             6 Minute Walk:  6 Minute Walk     Row Name 02/08/22 1612         6 Minute Walk   Phase Initial     Distance 665 feet     Walk Time 5.5 minutes     # of Rest Breaks 1     MPH 1.38     METS 1.65     RPE 12     Perceived  Dyspnea  1     VO2 Peak 5.77     Symptoms Yes (comment)     Comments Chest pain 7/10 during walk. Relieved with rest and then resumed walk with no chest pain.     Resting HR 84 bpm     Resting BP 110/60     Resting Oxygen Saturation  97 %     Exercise Oxygen Saturation  during 6 min walk 97 %     Max Ex. HR 108 bpm     Max Ex. BP 138/60     2 Minute Post BP 118/78              Oxygen Initial Assessment:   Oxygen Re-Evaluation:   Oxygen Discharge (Final Oxygen Re-Evaluation):   Initial Exercise Prescription:  Initial Exercise Prescription - 02/08/22 1600       Date of Initial Exercise RX and Referring Provider   Date 02/08/22    Referring Provider Corky Sox      Oxygen   Maintain Oxygen Saturation 88% or higher      NuStep   Level 1    SPM 80    Minutes 15    METs 1.65      Arm Ergometer   Level 1    RPM 30    Minutes 15    METs 1.65      REL-XR   Level 1    Speed 50    Minutes 15    METs 1.65      Biostep-RELP   Level 1    SPM 50    Minutes 15    METs 1.65      Track   Laps 7    Minutes 15    METs 1.38      Intensity   THRR 40-80% of Max Heartrate 112-141    Ratings of Perceived Exertion 11-13    Perceived Dyspnea 0-4      Progression   Progression Continue to progress workloads to maintain intensity without signs/symptoms of physical distress.      Resistance Training   Training Prescription Yes    Weight 3  Reps 10-15             Perform Capillary Blood Glucose checks as needed.  Exercise Prescription Changes:   Exercise Prescription Changes     Row Name 02/08/22 1600             Response to Exercise   Blood Pressure (Admit) 110/60       Blood Pressure (Exercise) 138/60       Blood Pressure (Exit) 118/78       Heart Rate (Admit) 84 bpm       Heart Rate (Exercise) 108 bpm       Heart Rate (Exit) 85 bpm       Oxygen Saturation (Admit) 97 %       Oxygen Saturation (Exercise) 97 %       Oxygen Saturation (Exit) 97  %       Rating of Perceived Exertion (Exercise) 12       Perceived Dyspnea (Exercise) 1       Symptoms Chest pain 7/10 relieved with rest. Resumed walk test and finished with no more pain.       Comments 6MWT results                Exercise Comments:   Exercise Goals and Review:   Exercise Goals     Row Name 02/08/22 1624             Exercise Goals   Increase Physical Activity Yes       Intervention Provide advice, education, support and counseling about physical activity/exercise needs.;Develop an individualized exercise prescription for aerobic and resistive training based on initial evaluation findings, risk stratification, comorbidities and participant's personal goals.       Expected Outcomes Short Term: Attend rehab on a regular basis to increase amount of physical activity.;Long Term: Add in home exercise to make exercise part of routine and to increase amount of physical activity.;Long Term: Exercising regularly at least 3-5 days a week.       Increase Strength and Stamina Yes       Intervention Provide advice, education, support and counseling about physical activity/exercise needs.;Develop an individualized exercise prescription for aerobic and resistive training based on initial evaluation findings, risk stratification, comorbidities and participant's personal goals.       Expected Outcomes Short Term: Increase workloads from initial exercise prescription for resistance, speed, and METs.;Short Term: Perform resistance training exercises routinely during rehab and add in resistance training at home;Long Term: Improve cardiorespiratory fitness, muscular endurance and strength as measured by increased METs and functional capacity (6MWT)       Able to understand and use rate of perceived exertion (RPE) scale Yes       Intervention Provide education and explanation on how to use RPE scale       Expected Outcomes Short Term: Able to use RPE daily in rehab to express subjective  intensity level;Long Term:  Able to use RPE to guide intensity level when exercising independently       Able to understand and use Dyspnea scale Yes       Intervention Provide education and explanation on how to use Dyspnea scale       Expected Outcomes Short Term: Able to use Dyspnea scale daily in rehab to express subjective sense of shortness of breath during exertion;Long Term: Able to use Dyspnea scale to guide intensity level when exercising independently       Knowledge and understanding of Target Heart Rate Range (  THRR) Yes       Intervention Provide education and explanation of THRR including how the numbers were predicted and where they are located for reference       Expected Outcomes Short Term: Able to state/look up THRR;Short Term: Able to use daily as guideline for intensity in rehab;Long Term: Able to use THRR to govern intensity when exercising independently       Able to check pulse independently Yes       Intervention Provide education and demonstration on how to check pulse in carotid and radial arteries.;Review the importance of being able to check your own pulse for safety during independent exercise       Expected Outcomes Short Term: Able to explain why pulse checking is important during independent exercise;Long Term: Able to check pulse independently and accurately       Understanding of Exercise Prescription Yes       Intervention Provide education, explanation, and written materials on patient's individual exercise prescription       Expected Outcomes Short Term: Able to explain program exercise prescription;Long Term: Able to explain home exercise prescription to exercise independently                Exercise Goals Re-Evaluation :   Discharge Exercise Prescription (Final Exercise Prescription Changes):  Exercise Prescription Changes - 02/08/22 1600       Response to Exercise   Blood Pressure (Admit) 110/60    Blood Pressure (Exercise) 138/60    Blood  Pressure (Exit) 118/78    Heart Rate (Admit) 84 bpm    Heart Rate (Exercise) 108 bpm    Heart Rate (Exit) 85 bpm    Oxygen Saturation (Admit) 97 %    Oxygen Saturation (Exercise) 97 %    Oxygen Saturation (Exit) 97 %    Rating of Perceived Exertion (Exercise) 12    Perceived Dyspnea (Exercise) 1    Symptoms Chest pain 7/10 relieved with rest. Resumed walk test and finished with no more pain.    Comments 6MWT results             Nutrition:  Target Goals: Understanding of nutrition guidelines, daily intake of sodium <1535m, cholesterol <206m calories 30% from fat and 7% or less from saturated fats, daily to have 5 or more servings of fruits and vegetables.  Education: All About Nutrition: -Group instruction provided by verbal, written material, interactive activities, discussions, models, and posters to present general guidelines for heart healthy nutrition including fat, fiber, MyPlate, the role of sodium in heart healthy nutrition, utilization of the nutrition label, and utilization of this knowledge for meal planning. Follow up email sent as well. Written material given at graduation. [facility]  Education need identified 02/08/22       Biometrics:  Pre Biometrics - 02/08/22 1627       Pre Biometrics   Height 5' 5.75" (1.67 m)    Weight 234 lb 1.6 oz (106.2 kg)    BMI (Calculated) 38.07    Single Leg Stand 6.6 seconds              Nutrition Therapy Plan and Nutrition Goals:  Nutrition Therapy & Goals - 02/08/22 1628       Intervention Plan   Intervention Prescribe, educate and counsel regarding individualized specific dietary modifications aiming towards targeted core components such as weight, hypertension, lipid management, diabetes, heart failure and other comorbidities.    Expected Outcomes  Short Term Goal: Understand basic principles of dietary content, such as calories, fat, sodium,  cholesterol and nutrients.;Short Term Goal: A plan has been developed with personal nutrition goals set during dietitian appointment.;Long Term Goal: Adherence to prescribed nutrition plan.             Nutrition Assessments:  MEDIFICTS Score Key: ?70 Need to make dietary changes  40-70 Heart Healthy Diet ? 40 Therapeutic Level Cholesterol Diet  Flowsheet Row Cardiac Rehab from 02/08/2022 in [facility] Cardiac and Pulmonary Rehab  Picture Your Plate Total Score on Admission 41      Picture Your Plate Scores: <53 Unhealthy dietary pattern with Woods room for improvement. 41-50 Dietary pattern unlikely to meet recommendations for good health and room for improvement. 51-60 More healthful dietary pattern, with some room for improvement.  >60 Healthy dietary pattern, although there may be some specific behaviors that could be improved.    Nutrition Goals Re-Evaluation:   Nutrition Goals Discharge (Final Nutrition Goals Re-Evaluation):   Psychosocial: Target Goals: Acknowledge presence or absence of significant depression and/or stress, maximize coping skills, provide positive support system. Participant is able to verbalize types and ability to use techniques and skills needed for reducing stress and depression.   Education: Stress, Anxiety, and Depression - Group verbal and visual presentation to define topics covered.  Reviews how body is impacted by stress, anxiety, and depression.  Also discusses healthy ways to reduce stress and to treat/manage anxiety and depression.  Written material given at graduation. Flowsheet Row Cardiac Rehab from 02/08/2022 in [facility] Cardiac and Pulmonary Rehab  Education need identified 02/08/22       Education: Sleep Hygiene -Provides group verbal and written instruction about how sleep can affect your health.  Define sleep hygiene, discuss sleep cycles and impact of sleep habits. Review good sleep hygiene tips.    Initial Review & Psychosocial  Screening:  Initial Psych Review & Screening - 01/19/22 0908       Initial Review   Current issues with None Identified      Family Dynamics   Good Support System? Yes   husband and her children     Barriers   Psychosocial barriers to participate in program There are no identifiable barriers or psychosocial needs.;The patient should benefit from training in stress management and relaxation.      Screening Interventions   Interventions Encouraged to exercise;To provide support and resources with identified psychosocial needs;Provide feedback about the scores to participant    Expected Outcomes Short Term goal: Utilizing psychosocial counselor, staff and physician to assist with identification of specific Stressors or current issues interfering with healing process. Setting desired goal for each stressor or current issue identified.;Long Term Goal: Stressors or current issues are controlled or eliminated.;Short Term goal: Identification and review with participant of any Quality of Life or Depression concerns found by scoring the questionnaire.;Long Term goal: The participant improves quality of Life and PHQ9 Scores as seen by post scores and/or verbalization of changes             Quality of Life Scores:   Quality of Life - 02/08/22 1630       Quality of Life   Select Quality of Life      Quality of Life Scores   Health/Function Pre 23.93 %    Socioeconomic Pre 28.17 %    Psych/Spiritual Pre 30 %    Family Pre 30 %    GLOBAL Pre 27 %  Scores of 19 and below usually indicate a poorer quality of life in these areas.  A difference of  2-3 points is a clinically meaningful difference.  A difference of 2-3 points in the total score of the Quality of Life Index has been associated with significant improvement in overall quality of life, self-image, physical symptoms, and general health in studies assessing change in quality of life.  PHQ-9: Review Flowsheet  More data  exists      02/08/2022 12/03/2021 09/03/2021 03/12/2021 02/19/2021  Depression screen PHQ 2/9  Decreased Interest 1 0 0 0 0  Down, Depressed, Hopeless 0 0 0 0 0  PHQ - 2 Score 1 0 0 0 0  Altered sleeping 0 - - - -  Tired, decreased energy 0 - - - -  Change in appetite 0 - - - -  Feeling bad or failure about yourself  0 - - - -  Trouble concentrating 0 - - - -  Moving slowly or fidgety/restless 0 - - - -  Suicidal thoughts 0 - - - -  PHQ-9 Score 1 - - - -  Difficult doing work/chores Not difficult at all - - - -   Interpretation of Total Score  Total Score Depression Severity:  1-4 = Minimal depression, 5-9 = Mild depression, 10-14 = Moderate depression, 15-19 = Moderately severe depression, 20-27 = Severe depression   Psychosocial Evaluation and Intervention:  Psychosocial Evaluation - 01/19/22 0912       Psychosocial Evaluation & Interventions   Comments Tomeshia is again returning to [facility] after another stent. SHe has no barriers to attending the program. She now lives in a house and no longer in a second floor apartment. Her support team is her husband and her children. She is ready to return and continue to learn heart healthy living.    Expected Outcomes STG Tatanisha will attend all scheduled sessions, she will progress with her exercise LTG Ashya will be able to continue with ehr exercie progression and maintain control of her risk factors.    Continue Psychosocial Services  Follow up required by staff             Psychosocial Re-Evaluation:   Psychosocial Discharge (Final Psychosocial Re-Evaluation):   Vocational Rehabilitation: Provide vocational rehab assistance to qualifying candidates.   Vocational Rehab Evaluation & Intervention:  Vocational Rehab - 01/19/22 0912       Initial Vocational Rehab Evaluation & Intervention   Assessment shows need for Vocational Rehabilitation No      Vocational Rehab Re-Evaulation   Comments no request fro VR              Education: Education Goals: Education classes will be provided on a variety of topics geared toward better understanding of heart health and risk factor modification. Participant will state understanding/return demonstration of topics presented as noted by education test scores.  Learning Barriers/Preferences:   General Cardiac Education Topics:  AED/CPR: - Group verbal and written instruction with the use of models to demonstrate the basic use of the AED with the basic ABC's of resuscitation.   Anatomy and Cardiac Procedures: - Group verbal and visual presentation and models provide information about basic cardiac anatomy and function. Reviews the testing methods done to diagnose heart disease and the outcomes of the test results. Describes the treatment choices: Medical Management, Angioplasty, or Coronary Bypass Surgery for treating various heart conditions including Myocardial Infarction, Angina, Valve Disease, and Cardiac Arrhythmias.  Written material given at graduation. Flowsheet  Row Cardiac Rehab from 02/08/2022 in [facility] Cardiac and Pulmonary Rehab  Education need identified 02/08/22       Medication Safety: - Group verbal and visual instruction to review commonly prescribed medications for heart and lung disease. Reviews the medication, class of the drug, and side effects. Includes the steps to properly store meds and maintain the prescription regimen.  Written material given at graduation.   Intimacy: - Group verbal instruction through game format to discuss how heart and lung disease can affect sexual intimacy. Written material given at graduation..   Know Your Numbers and Heart Failure: - Group verbal and visual instruction to discuss disease risk factors for cardiac and pulmonary disease and treatment options.  Reviews associated critical values for Overweight/Obesity, Hypertension, Cholesterol, and Diabetes.  Discusses basics of heart failure: signs/symptoms and  treatments.  Introduces Heart Failure Zone chart for action plan for heart failure.  Written material given at graduation.   Infection Prevention: - Provides verbal and written material to individual with discussion of infection control including proper hand washing and proper equipment cleaning during exercise session. Flowsheet Row Cardiac Rehab from 02/08/2022 in [facility] Cardiac and Pulmonary Rehab  Date 02/08/22  Educator [facility]  Instruction Review Code 1- Verbalizes Understanding       Falls Prevention: - Provides verbal and written material to individual with discussion of falls prevention and safety. Flowsheet Row Cardiac Rehab from 02/08/2022 in [facility] Cardiac and Pulmonary Rehab  Date 02/08/22  Educator [facility]  Instruction Review Code 1- Verbalizes Understanding       Other: -Provides group and verbal instruction on various topics (see comments)   Knowledge Questionnaire Score:  Knowledge Questionnaire Score - 02/08/22 1631       Knowledge Questionnaire Score   Pre Score 19/26             Core Components/Risk Factors/Patient Goals at Admission:  Personal Goals and Risk Factors at Admission - 02/08/22 1628       Core Components/Risk Factors/Patient Goals on Admission    Weight Management Yes    Intervention Weight Management: Develop a combined nutrition and exercise program designed to reach desired caloric intake, while maintaining appropriate intake of nutrient and fiber, sodium and fats, and appropriate energy expenditure required for the weight goal.;Weight Management: Provide education and appropriate resources to help participant work on and attain dietary goals.;Weight Management/Obesity: Establish reasonable short term and long term weight goals.;Obesity: Provide education and appropriate resources to help participant work on and attain dietary goals.    Admit Weight 234 lb 1.6 oz (106.2 kg)    Goal Weight: Short Term 228 lb (103.4 kg)    Goal Weight: Long Term 200 lb  (90.7 kg)    Expected Outcomes Short Term: Continue to assess and modify interventions until short term weight is achieved;Long Term: Adherence to nutrition and physical activity/exercise program aimed toward attainment of established weight goal;Weight Loss: Understanding of general recommendations for a balanced deficit meal plan, which promotes 1-2 lb weight loss per week and includes a negative energy balance of [phone] kcal/d;Understanding recommendations for meals to include 15-35% energy as protein, 25-35% energy from fat, 35-60% energy from carbohydrates, less than 277m of dietary cholesterol, 20-35 gm of total fiber daily;Understanding of distribution of calorie intake throughout the day with the consumption of 4-5 meals/snacks    Diabetes Yes    Intervention Provide education about signs/symptoms and action to take for hypo/hyperglycemia.;Provide education about proper nutrition, including hydration, and aerobic/resistive exercise prescription along with prescribed  medications to achieve blood glucose in normal ranges: Fasting glucose 65-99 mg/dL    Expected Outcomes Short Term: Participant verbalizes understanding of the signs/symptoms and immediate care of hyper/hypoglycemia, proper foot care and importance of medication, aerobic/resistive exercise and nutrition plan for blood glucose control.;Long Term: Attainment of HbA1C < 7%.    Hypertension Yes    Intervention Provide education on lifestyle modifcations including regular physical activity/exercise, weight management, moderate sodium restriction and increased consumption of fresh fruit, vegetables, and low fat dairy, alcohol moderation, and smoking cessation.;Monitor prescription use compliance.    Expected Outcomes Short Term: Continued assessment and intervention until BP is < 140/35m HG in hypertensive participants. < 130/859mHG in hypertensive participants with diabetes, heart failure or chronic kidney disease.;Long Term: Maintenance  of blood pressure at goal levels.    Lipids Yes    Intervention Provide education and support for participant on nutrition & aerobic/resistive exercise along with prescribed medications to achieve LDL <7046mHDL >57m25m  Expected Outcomes Short Term: Participant states understanding of desired cholesterol values and is compliant with medications prescribed. Participant is following exercise prescription and nutrition guidelines.;Long Term: Cholesterol controlled with medications as prescribed, with individualized exercise RX and with personalized nutrition plan. Value goals: LDL < 70mg32mL > 40 mg.             Education:Diabetes - Individual verbal and written instruction to review signs/symptoms of diabetes, desired ranges of glucose level fasting, after meals and with exercise. Acknowledge that pre and post exercise glucose checks will be done for 3 sessions at entry of program. [facility]  Education need identified 02/08/22       Core Components/Risk Factors/Patient Goals Review:    Core Components/Risk Factors/Patient Goals at Discharge (Final Review):    ITP Comments:  ITP Comments     Row Name 01/19/22 0915 02/08/22 1611         ITP Comments Virtual orientation call completed today. shehas an appointment on Date: 01/26/2022  for EP eval and gym Orientation.  [facility] . Completed 6MWT and gym orientation. Initial ITP created and sent for review to Dr. Mark Emily Filbertical Director.               Comments: initial ITP

## 2022-02-08 NOTE — Patient Instructions (Signed)
Patient Instructions  Patient Details  Name: Felicia Woods MRN: [id number] Date of Birth: 01-21-1956 Referring Provider:  Andrez Grime, MD  Below are your personal goals for exercise, nutrition, and risk factors. Our goal is to help you stay on track towards obtaining and maintaining these goals. We will be discussing your progress on these goals with you throughout the program.  Initial Exercise Prescription:  Initial Exercise Prescription - 02/08/22 1600       Date of Initial Exercise RX and Referring Provider   Date 02/08/22    Referring Provider Corky Sox      Oxygen   Maintain Oxygen Saturation 88% or higher      NuStep   Level 1    SPM 80    Minutes 15    METs 1.65      Arm Ergometer   Level 1    RPM 30    Minutes 15    METs 1.65      REL-XR   Level 1    Speed 50    Minutes 15    METs 1.65      Biostep-RELP   Level 1    SPM 50    Minutes 15    METs 1.65      Track   Laps 7    Minutes 15    METs 1.38      Intensity   THRR 40-80% of Max Heartrate 112-141    Ratings of Perceived Exertion 11-13    Perceived Dyspnea 0-4      Progression   Progression Continue to progress workloads to maintain intensity without signs/symptoms of physical distress.      Resistance Training   Training Prescription Yes    Weight 3    Reps 10-15             Exercise Goals: Frequency: Be able to perform aerobic exercise two to three times per week in program working toward 2-5 days per week of home exercise.  Intensity: Work with a perceived exertion of 11 (fairly light) - 15 (hard) while following your exercise prescription.  We will make changes to your prescription with you as you progress through the program.   Duration: Be able to do 30 to 45 minutes of continuous aerobic exercise in addition to a 5 minute warm-up and a 5 minute cool-down routine.   Nutrition Goals: Your personal nutrition goals will be established when you do your nutrition analysis  with the dietician.  The following are general nutrition guidelines to follow: Cholesterol < '200mg'$ /day Sodium < '1500mg'$ /day Fiber: Women over 50 yrs - 21 grams per day  Personal Goals:  Personal Goals and Risk Factors at Admission - 02/08/22 1628       Core Components/Risk Factors/Patient Goals on Admission    Weight Management Yes    Intervention Weight Management: Develop a combined nutrition and exercise program designed to reach desired caloric intake, while maintaining appropriate intake of nutrient and fiber, sodium and fats, and appropriate energy expenditure required for the weight goal.;Weight Management: Provide education and appropriate resources to help participant work on and attain dietary goals.;Weight Management/Obesity: Establish reasonable short term and long term weight goals.;Obesity: Provide education and appropriate resources to help participant work on and attain dietary goals.    Admit Weight 234 lb 1.6 oz (106.2 kg)    Goal Weight: Short Term 228 lb (103.4 kg)    Goal Weight: Long Term 200 lb (90.7 kg)    Expected Outcomes Short  Term: Continue to assess and modify interventions until short term weight is achieved;Long Term: Adherence to nutrition and physical activity/exercise program aimed toward attainment of established weight goal;Weight Loss: Understanding of general recommendations for a balanced deficit meal plan, which promotes 1-2 lb weight loss per week and includes a negative energy balance of [phone] kcal/d;Understanding recommendations for meals to include 15-35% energy as protein, 25-35% energy from fat, 35-60% energy from carbohydrates, less than '200mg'$  of dietary cholesterol, 20-35 gm of total fiber daily;Understanding of distribution of calorie intake throughout the day with the consumption of 4-5 meals/snacks    Diabetes Yes    Intervention Provide education about signs/symptoms and action to take for hypo/hyperglycemia.;Provide education about proper  nutrition, including hydration, and aerobic/resistive exercise prescription along with prescribed medications to achieve blood glucose in normal ranges: Fasting glucose 65-99 mg/dL    Expected Outcomes Short Term: Participant verbalizes understanding of the signs/symptoms and immediate care of hyper/hypoglycemia, proper foot care and importance of medication, aerobic/resistive exercise and nutrition plan for blood glucose control.;Long Term: Attainment of HbA1C < 7%.    Hypertension Yes    Intervention Provide education on lifestyle modifcations including regular physical activity/exercise, weight management, moderate sodium restriction and increased consumption of fresh fruit, vegetables, and low fat dairy, alcohol moderation, and smoking cessation.;Monitor prescription use compliance.    Expected Outcomes Short Term: Continued assessment and intervention until BP is < 140/36m HG in hypertensive participants. < 130/876mHG in hypertensive participants with diabetes, heart failure or chronic kidney disease.;Long Term: Maintenance of blood pressure at goal levels.    Lipids Yes    Intervention Provide education and support for participant on nutrition & aerobic/resistive exercise along with prescribed medications to achieve LDL '70mg'$ , HDL >'40mg'$ .    Expected Outcomes Short Term: Participant states understanding of desired cholesterol values and is compliant with medications prescribed. Participant is following exercise prescription and nutrition guidelines.;Long Term: Cholesterol controlled with medications as prescribed, with individualized exercise RX and with personalized nutrition plan. Value goals: LDL < '70mg'$ , HDL > 40 mg.             Tobacco Use Initial Evaluation: Social History   Tobacco Use  Smoking Status Never  Smokeless Tobacco Never    Exercise Goals and Review:  Exercise Goals     Row Name 02/08/22 1624             Exercise Goals   Increase Physical Activity Yes        Intervention Provide advice, education, support and counseling about physical activity/exercise needs.;Develop an individualized exercise prescription for aerobic and resistive training based on initial evaluation findings, risk stratification, comorbidities and participant's personal goals.       Expected Outcomes Short Term: Attend rehab on a regular basis to increase amount of physical activity.;Long Term: Add in home exercise to make exercise part of routine and to increase amount of physical activity.;Long Term: Exercising regularly at least 3-5 days a week.       Increase Strength and Stamina Yes       Intervention Provide advice, education, support and counseling about physical activity/exercise needs.;Develop an individualized exercise prescription for aerobic and resistive training based on initial evaluation findings, risk stratification, comorbidities and participant's personal goals.       Expected Outcomes Short Term: Increase workloads from initial exercise prescription for resistance, speed, and METs.;Short Term: Perform resistance training exercises routinely during rehab and add in resistance training at home;Long Term: Improve cardiorespiratory fitness, muscular endurance and strength  as measured by increased METs and functional capacity (6MWT)       Able to understand and use rate of perceived exertion (RPE) scale Yes       Intervention Provide education and explanation on how to use RPE scale       Expected Outcomes Short Term: Able to use RPE daily in rehab to express subjective intensity level;Long Term:  Able to use RPE to guide intensity level when exercising independently       Able to understand and use Dyspnea scale Yes       Intervention Provide education and explanation on how to use Dyspnea scale       Expected Outcomes Short Term: Able to use Dyspnea scale daily in rehab to express subjective sense of shortness of breath during exertion;Long Term: Able to use Dyspnea scale to  guide intensity level when exercising independently       Knowledge and understanding of Target Heart Rate Range (THRR) Yes       Intervention Provide education and explanation of THRR including how the numbers were predicted and where they are located for reference       Expected Outcomes Short Term: Able to state/look up THRR;Short Term: Able to use daily as guideline for intensity in rehab;Long Term: Able to use THRR to govern intensity when exercising independently       Able to check pulse independently Yes       Intervention Provide education and demonstration on how to check pulse in carotid and radial arteries.;Review the importance of being able to check your own pulse for safety during independent exercise       Expected Outcomes Short Term: Able to explain why pulse checking is important during independent exercise;Long Term: Able to check pulse independently and accurately       Understanding of Exercise Prescription Yes       Intervention Provide education, explanation, and written materials on patient's individual exercise prescription       Expected Outcomes Short Term: Able to explain program exercise prescription;Long Term: Able to explain home exercise prescription to exercise independently                Copy of goals given to participant.

## 2022-02-09 ENCOUNTER — Ambulatory Visit: Payer: Medicare (Managed Care)

## 2022-02-10 ENCOUNTER — Ambulatory Visit: Payer: Medicare (Managed Care) | Admitting: Internal Medicine

## 2022-02-10 ENCOUNTER — Encounter: Payer: Self-pay | Admitting: *Deleted

## 2022-02-10 DIAGNOSIS — J441 Chronic obstructive pulmonary disease with (acute) exacerbation: Secondary | ICD-10-CM

## 2022-02-10 DIAGNOSIS — R053 Chronic cough: Secondary | ICD-10-CM

## 2022-02-10 DIAGNOSIS — R062 Wheezing: Secondary | ICD-10-CM

## 2022-02-10 DIAGNOSIS — Z955 Presence of coronary angioplasty implant and graft: Secondary | ICD-10-CM

## 2022-02-10 NOTE — Progress Notes (Signed)
Cardiac Individual Treatment Plan  Patient Details  Name: Felicia Woods MRN: [id number] Date of Birth: 1956-06-12 Referring Provider:   Flowsheet Row Cardiac Rehab from 02/08/2022 in [facility] Cardiac and Pulmonary Rehab  Referring Provider Corky Sox       Initial Encounter Date:  Flowsheet Row Cardiac Rehab from 02/08/2022 in [facility] Cardiac and Pulmonary Rehab  Date 02/08/22       Visit Diagnosis: Status post coronary artery stent placement  Patient's Home Medications on Admission:  Current Outpatient Medications:    Accu-Chek Softclix Lancets lancets, Use as instructed twice a day to check blood sugars  DIAG -E11.59, Disp: 100 each, Rfl: 3   albuterol (VENTOLIN HFA) 108 (90 Base) MCG/ACT inhaler, Inhale 2 puffs into the lungs every 6 (six) hours as needed for wheezing or shortness of breath., Disp: 8 g, Rfl: 6   amLODipine (NORVASC) 10 MG tablet, Take 1 tablet (10 mg total) by mouth daily., Disp: 90 tablet, Rfl: 1   aspirin EC 81 MG tablet, Take 81 mg by mouth daily., Disp: , Rfl:    atorvastatin (LIPITOR) 80 MG tablet, Take 1 tablet (80 mg total) by mouth daily., Disp: 30 tablet, Rfl: 2   bismuth subsalicylate (PEPTO BISMOL) 262 MG/15ML suspension, Take 30 mLs by mouth every 6 (six) hours as needed for up to 14 days., Disp: 360 mL, Rfl: 0   clarithromycin (BIAXIN) 500 MG tablet, Take 1 tablet (500 mg total) by mouth 2 (two) times daily., Disp: 28 tablet, Rfl: 0   clopidogrel (PLAVIX) 75 MG tablet, Take 1 tablet (75 mg total) by mouth daily., Disp: 90 tablet, Rfl: 3   cyclobenzaprine (FLEXERIL) 10 MG tablet, Take 1 tablet (10 mg total) by mouth at bedtime., Disp: 30 tablet, Rfl: 1   dapagliflozin propanediol (FARXIGA) 10 MG TABS tablet, Take 1 tablet (10 mg total) by mouth daily., Disp: 90 tablet, Rfl: 1   doxycycline (ADOXA) 100 MG tablet, Take 1 tablet (100 mg total) by mouth 2 (two) times daily., Disp: 20 tablet, Rfl: 0   doxycycline (VIBRA-TABS) 100 MG tablet, Take 1 tablet (100 mg total)  by mouth 2 (two) times daily for 14 days., Disp: 28 tablet, Rfl: 0   Dulaglutide (TRULICITY) 1.5 XK/4.8JE SOPN, INJECT 1.5MG INTO THE SKIN ONCE A WEEK, Disp: 12 mL, Rfl: 1   famotidine (PEPCID) 20 MG tablet, Take 1 tablet by mouth in the morning and at bedtime., Disp: , Rfl:    Fluticasone-Umeclidin-Vilant (TRELEGY ELLIPTA) 100-62.5-25 MCG/ACT AEPB, Inhale 1 puff into the lungs daily., Disp: 1 each, Rfl: 11   furosemide (LASIX) 40 MG tablet, Take 1 tablet (40 mg total) by mouth 2 (two) times daily., Disp: 180 tablet, Rfl: 1   glucose blood (ACCU-CHEK GUIDE) test strip, Use as instructed to check blood sugars twice a day  E11.59, Disp: 100 each, Rfl: 3   ipratropium-albuterol (DUONEB) 0.5-2.5 (3) MG/3ML SOLN, Take 3 mLs by nebulization every 4 (four) hours as needed., Disp: 360 mL, Rfl: 3   isosorbide mononitrate (IMDUR) 60 MG 24 hr tablet, Take 1 tablet by mouth daily., Disp: , Rfl:    loperamide (IMODIUM A-D) 2 MG tablet, Take 2 tablets (4 mg total) by mouth in the morning, at noon, and at bedtime., Disp: 540 tablet, Rfl: 0   losartan (COZAAR) 100 MG tablet, Take 1 tablet (100 mg total) by mouth daily. Take one tab po qd, Disp: 90 tablet, Rfl: 1   Misc. Devices (ADJUST BATH/SHOWER SEAT) MISC, 1 shower seat for use at home  to reduce risk of falling., Disp: 1 each, Rfl: 0   montelukast (SINGULAIR) 10 MG tablet, Take 1 tablet (10 mg total) by mouth at bedtime., Disp: 90 tablet, Rfl: 3   nitroGLYCERIN (NITROSTAT) 0.4 MG SL tablet, DISSOLVE ONE TABLET UNDER THE TONGUE EVERY 5 MINUTES AS NEEDED FOR CHEST PAIN.  DO NOT EXCEED A TOTAL OF 3 DOSES IN 15 MINUTES, Disp: 30 tablet, Rfl: 3   omeprazole (PRILOSEC) 20 MG capsule, Take 1 capsule (20 mg total) by mouth 2 (two) times daily before a meal., Disp: 28 capsule, Rfl: 0   oxybutynin (DITROPAN-XL) 5 MG 24 hr tablet, Take 1 tablet by mouth at bedtime., Disp: , Rfl:    sertraline (ZOLOFT) 100 MG tablet, Take 100 mg by mouth daily., Disp: , Rfl:    zolpidem  (AMBIEN) 5 MG tablet, TAKE 1 TABLET BY MOUTH AT BEDTIME AS NEEDED FOR SLEEP, Disp: 15 tablet, Rfl: 0  Past Medical History: Past Medical History:  Diagnosis Date   Asthma    Coronary artery disease    Diabetes mellitus without complication ([place])    Heart attack ([place])    Hyperlipidemia    Hypertension    MI (myocardial infarction) ([place])    Migraine headache with aura    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    Tobacco Use: Social History   Tobacco Use  Smoking Status Never  Smokeless Tobacco Never    Labs: Review Flowsheet  More data exists      Latest Ref Rng & Units 02/19/2021 06/10/2021 09/03/2021 09/11/2021 12/03/2021  Labs for ITP Cardiac and Pulmonary Rehab  Cholestrol 0 - 200 mg/dL - 181  - 243  -  LDL (calc) 0 - 99 mg/dL - 125  - 181  -  HDL-C >40 mg/dL - 32  - 50  -  Trlycerides <150 mg/dL - 121  - 62  -  Hemoglobin A1c 4.0 - 5.6 % 7.0  8.2  7.1  - 7.2      Exercise Target Goals: Exercise Program Goal: Individual exercise prescription set using results from initial 6 min walk test and THRR while considering  patient's activity barriers and safety.   Exercise Prescription Goal: Initial exercise prescription builds to 30-45 minutes a day of aerobic activity, 2-3 days per week.  Home exercise guidelines will be given to patient during program as part of exercise prescription that the participant will acknowledge.   Education: Aerobic Exercise: - Group verbal and visual presentation on the components of exercise prescription. Introduces F.I.T.T principle from ACSM for exercise prescriptions.  Reviews F.I.T.T. principles of aerobic exercise including progression. Written material given at graduation. Flowsheet Row Cardiac Rehab from 02/08/2022 in [facility] Cardiac and Pulmonary Rehab  Education need identified 02/08/22       Education: Resistance Exercise: - Group verbal and visual presentation on the components of exercise prescription. Introduces F.I.T.T principle from ACSM  for exercise prescriptions  Reviews F.I.T.T. principles of resistance exercise including progression. Written material given at graduation.    Education: Exercise & Equipment Safety: - Individual verbal instruction and demonstration of equipment use and safety with use of the equipment. Flowsheet Row Cardiac Rehab from 02/08/2022 in [facility] Cardiac and Pulmonary Rehab  Date 02/08/22  Educator [facility]  Instruction Review Code 1- Verbalizes Understanding       Education: Exercise Physiology & General Exercise Guidelines: - Group verbal and written instruction with models to review the exercise physiology of the cardiovascular system and associated critical values. Provides general  exercise guidelines with specific guidelines to those with heart or lung disease.    Education: Flexibility, Balance, Mind/Body Relaxation: - Group verbal and visual presentation with interactive activity on the components of exercise prescription. Introduces F.I.T.T principle from ACSM for exercise prescriptions. Reviews F.I.T.T. principles of flexibility and balance exercise training including progression. Also discusses the mind body connection.  Reviews various relaxation techniques to help reduce and manage stress (i.e. Deep breathing, progressive muscle relaxation, and visualization). Balance handout provided to take home. Written material given at graduation.   Activity Barriers & Risk Stratification:  Activity Barriers & Cardiac Risk Stratification - 01/19/22 0907       Activity Barriers & Cardiac Risk Stratification   Activity Barriers Arthritis;Joint Problems;Shortness of Breath    Cardiac Risk Stratification High             6 Minute Walk:  6 Minute Walk     Row Name 02/08/22 1612         6 Minute Walk   Phase Initial     Distance 665 feet     Walk Time 5.5 minutes     # of Rest Breaks 1     MPH 1.38     METS 1.65     RPE 12     Perceived Dyspnea  1     VO2 Peak 5.77     Symptoms Yes  (comment)     Comments Chest pain 7/10 during walk. Relieved with rest and then resumed walk with no chest pain.     Resting HR 84 bpm     Resting BP 110/60     Resting Oxygen Saturation  97 %     Exercise Oxygen Saturation  during 6 min walk 97 %     Max Ex. HR 108 bpm     Max Ex. BP 138/60     2 Minute Post BP 118/78              Oxygen Initial Assessment:   Oxygen Re-Evaluation:   Oxygen Discharge (Final Oxygen Re-Evaluation):   Initial Exercise Prescription:  Initial Exercise Prescription - 02/08/22 1600       Date of Initial Exercise RX and Referring Provider   Date 02/08/22    Referring Provider Corky Sox      Oxygen   Maintain Oxygen Saturation 88% or higher      NuStep   Level 1    SPM 80    Minutes 15    METs 1.65      Arm Ergometer   Level 1    RPM 30    Minutes 15    METs 1.65      REL-XR   Level 1    Speed 50    Minutes 15    METs 1.65      Biostep-RELP   Level 1    SPM 50    Minutes 15    METs 1.65      Track   Laps 7    Minutes 15    METs 1.38      Intensity   THRR 40-80% of Max Heartrate 112-141    Ratings of Perceived Exertion 11-13    Perceived Dyspnea 0-4      Progression   Progression Continue to progress workloads to maintain intensity without signs/symptoms of physical distress.      Resistance Training   Training Prescription Yes    Weight 3    Reps 10-15  Perform Capillary Blood Glucose checks as needed.  Exercise Prescription Changes:   Exercise Prescription Changes     Row Name 02/08/22 1600             Response to Exercise   Blood Pressure (Admit) 110/60       Blood Pressure (Exercise) 138/60       Blood Pressure (Exit) 118/78       Heart Rate (Admit) 84 bpm       Heart Rate (Exercise) 108 bpm       Heart Rate (Exit) 85 bpm       Oxygen Saturation (Admit) 97 %       Oxygen Saturation (Exercise) 97 %       Oxygen Saturation (Exit) 97 %       Rating of Perceived Exertion (Exercise)  12       Perceived Dyspnea (Exercise) 1       Symptoms Chest pain 7/10 relieved with rest. Resumed walk test and finished with no more pain.       Comments 6MWT results                Exercise Comments:   Exercise Goals and Review:   Exercise Goals     Row Name 02/08/22 1624             Exercise Goals   Increase Physical Activity Yes       Intervention Provide advice, education, support and counseling about physical activity/exercise needs.;Develop an individualized exercise prescription for aerobic and resistive training based on initial evaluation findings, risk stratification, comorbidities and participant's personal goals.       Expected Outcomes Short Term: Attend rehab on a regular basis to increase amount of physical activity.;Long Term: Add in home exercise to make exercise part of routine and to increase amount of physical activity.;Long Term: Exercising regularly at least 3-5 days a week.       Increase Strength and Stamina Yes       Intervention Provide advice, education, support and counseling about physical activity/exercise needs.;Develop an individualized exercise prescription for aerobic and resistive training based on initial evaluation findings, risk stratification, comorbidities and participant's personal goals.       Expected Outcomes Short Term: Increase workloads from initial exercise prescription for resistance, speed, and METs.;Short Term: Perform resistance training exercises routinely during rehab and add in resistance training at home;Long Term: Improve cardiorespiratory fitness, muscular endurance and strength as measured by increased METs and functional capacity (6MWT)       Able to understand and use rate of perceived exertion (RPE) scale Yes       Intervention Provide education and explanation on how to use RPE scale       Expected Outcomes Short Term: Able to use RPE daily in rehab to express subjective intensity level;Long Term:  Able to use RPE to  guide intensity level when exercising independently       Able to understand and use Dyspnea scale Yes       Intervention Provide education and explanation on how to use Dyspnea scale       Expected Outcomes Short Term: Able to use Dyspnea scale daily in rehab to express subjective sense of shortness of breath during exertion;Long Term: Able to use Dyspnea scale to guide intensity level when exercising independently       Knowledge and understanding of Target Heart Rate Range (THRR) Yes       Intervention Provide education and explanation of  THRR including how the numbers were predicted and where they are located for reference       Expected Outcomes Short Term: Able to state/look up THRR;Short Term: Able to use daily as guideline for intensity in rehab;Long Term: Able to use THRR to govern intensity when exercising independently       Able to check pulse independently Yes       Intervention Provide education and demonstration on how to check pulse in carotid and radial arteries.;Review the importance of being able to check your own pulse for safety during independent exercise       Expected Outcomes Short Term: Able to explain why pulse checking is important during independent exercise;Long Term: Able to check pulse independently and accurately       Understanding of Exercise Prescription Yes       Intervention Provide education, explanation, and written materials on patient's individual exercise prescription       Expected Outcomes Short Term: Able to explain program exercise prescription;Long Term: Able to explain home exercise prescription to exercise independently                Exercise Goals Re-Evaluation :   Discharge Exercise Prescription (Final Exercise Prescription Changes):  Exercise Prescription Changes - 02/08/22 1600       Response to Exercise   Blood Pressure (Admit) 110/60    Blood Pressure (Exercise) 138/60    Blood Pressure (Exit) 118/78    Heart Rate (Admit) 84 bpm     Heart Rate (Exercise) 108 bpm    Heart Rate (Exit) 85 bpm    Oxygen Saturation (Admit) 97 %    Oxygen Saturation (Exercise) 97 %    Oxygen Saturation (Exit) 97 %    Rating of Perceived Exertion (Exercise) 12    Perceived Dyspnea (Exercise) 1    Symptoms Chest pain 7/10 relieved with rest. Resumed walk test and finished with no more pain.    Comments 6MWT results             Nutrition:  Target Goals: Understanding of nutrition guidelines, daily intake of sodium <1539m, cholesterol <201m calories 30% from fat and 7% or less from saturated fats, daily to have 5 or more servings of fruits and vegetables.  Education: All About Nutrition: -Group instruction provided by verbal, written material, interactive activities, discussions, models, and posters to present general guidelines for heart healthy nutrition including fat, fiber, MyPlate, the role of sodium in heart healthy nutrition, utilization of the nutrition label, and utilization of this knowledge for meal planning. Follow up email sent as well. Written material given at graduation. [facility]  Education need identified 02/08/22       Biometrics:  Pre Biometrics - 02/08/22 1627       Pre Biometrics   Height 5' 5.75" (1.67 m)    Weight 234 lb 1.6 oz (106.2 kg)    BMI (Calculated) 38.07    Single Leg Stand 6.6 seconds              Nutrition Therapy Plan and Nutrition Goals:  Nutrition Therapy & Goals - 02/08/22 1628       Intervention Plan   Intervention Prescribe, educate and counsel regarding individualized specific dietary modifications aiming towards targeted core components such as weight, hypertension, lipid management, diabetes, heart failure and other comorbidities.    Expected Outcomes Short Term Goal: Understand basic principles of dietary content, such as calories, fat, sodium,  cholesterol and nutrients.;Short Term Goal: A plan has been  developed with personal nutrition goals set during dietitian appointment.;Long Term Goal: Adherence to prescribed nutrition plan.             Nutrition Assessments:  MEDIFICTS Score Key: ?70 Need to make dietary changes  40-70 Heart Healthy Diet ? 40 Therapeutic Level Cholesterol Diet  Flowsheet Row Cardiac Rehab from 02/08/2022 in [facility] Cardiac and Pulmonary Rehab  Picture Your Plate Total Score on Admission 41      Picture Your Plate Scores: <16 Unhealthy dietary pattern with much room for improvement. 41-50 Dietary pattern unlikely to meet recommendations for good health and room for improvement. 51-60 More healthful dietary pattern, with some room for improvement.  >60 Healthy dietary pattern, although there may be some specific behaviors that could be improved.    Nutrition Goals Re-Evaluation:   Nutrition Goals Discharge (Final Nutrition Goals Re-Evaluation):   Psychosocial: Target Goals: Acknowledge presence or absence of significant depression and/or stress, maximize coping skills, provide positive support system. Participant is able to verbalize types and ability to use techniques and skills needed for reducing stress and depression.   Education: Stress, Anxiety, and Depression - Group verbal and visual presentation to define topics covered.  Reviews how body is impacted by stress, anxiety, and depression.  Also discusses healthy ways to reduce stress and to treat/manage anxiety and depression.  Written material given at graduation. Flowsheet Row Cardiac Rehab from 02/08/2022 in [facility] Cardiac and Pulmonary Rehab  Education need identified 02/08/22       Education: Sleep Hygiene -Provides group verbal and written instruction about how sleep can affect your health.  Define sleep hygiene, discuss sleep cycles and impact of sleep habits. Review good sleep hygiene tips.    Initial Review & Psychosocial Screening:  Initial Psych Review & Screening - 01/19/22 0908        Initial Review   Current issues with None Identified      Family Dynamics   Good Support System? Yes   husband and her children     Barriers   Psychosocial barriers to participate in program There are no identifiable barriers or psychosocial needs.;The patient should benefit from training in stress management and relaxation.      Screening Interventions   Interventions Encouraged to exercise;To provide support and resources with identified psychosocial needs;Provide feedback about the scores to participant    Expected Outcomes Short Term goal: Utilizing psychosocial counselor, staff and physician to assist with identification of specific Stressors or current issues interfering with healing process. Setting desired goal for each stressor or current issue identified.;Long Term Goal: Stressors or current issues are controlled or eliminated.;Short Term goal: Identification and review with participant of any Quality of Life or Depression concerns found by scoring the questionnaire.;Long Term goal: The participant improves quality of Life and PHQ9 Scores as seen by post scores and/or verbalization of changes             Quality of Life Scores:   Quality of Life - 02/08/22 1630       Quality of Life   Select Quality of Life      Quality of Life Scores   Health/Function Pre 23.93 %    Socioeconomic Pre 28.17 %    Psych/Spiritual Pre 30 %    Family Pre 30 %    GLOBAL Pre 27 %            Scores of 19 and below usually indicate a poorer  quality of life in these areas.  A difference of  2-3 points is a clinically meaningful difference.  A difference of 2-3 points in the total score of the Quality of Life Index has been associated with significant improvement in overall quality of life, self-image, physical symptoms, and general health in studies assessing change in quality of life.  PHQ-9: Review Flowsheet  More data exists      02/08/2022 12/03/2021 09/03/2021 03/12/2021 02/19/2021   Depression screen PHQ 2/9  Decreased Interest 1 0 0 0 0  Down, Depressed, Hopeless 0 0 0 0 0  PHQ - 2 Score 1 0 0 0 0  Altered sleeping 0 - - - -  Tired, decreased energy 0 - - - -  Change in appetite 0 - - - -  Feeling bad or failure about yourself  0 - - - -  Trouble concentrating 0 - - - -  Moving slowly or fidgety/restless 0 - - - -  Suicidal thoughts 0 - - - -  PHQ-9 Score 1 - - - -  Difficult doing work/chores Not difficult at all - - - -   Interpretation of Total Score  Total Score Depression Severity:  1-4 = Minimal depression, 5-9 = Mild depression, 10-14 = Moderate depression, 15-19 = Moderately severe depression, 20-27 = Severe depression   Psychosocial Evaluation and Intervention:  Psychosocial Evaluation - 01/19/22 0912       Psychosocial Evaluation & Interventions   Comments Kameisha is again returning to [facility] after another stent. SHe has no barriers to attending the program. She now lives in a house and no longer in a second floor apartment. Her support team is her husband and her children. She is ready to return and continue to learn heart healthy living.    Expected Outcomes STG Avital will attend all scheduled sessions, she will progress with her exercise LTG Mylee will be able to continue with ehr exercie progression and maintain control of her risk factors.    Continue Psychosocial Services  Follow up required by staff             Psychosocial Re-Evaluation:   Psychosocial Discharge (Final Psychosocial Re-Evaluation):   Vocational Rehabilitation: Provide vocational rehab assistance to qualifying candidates.   Vocational Rehab Evaluation & Intervention:  Vocational Rehab - 01/19/22 0912       Initial Vocational Rehab Evaluation & Intervention   Assessment shows need for Vocational Rehabilitation No      Vocational Rehab Re-Evaulation   Comments no request fro VR             Education: Education Goals: Education classes will be provided on a  variety of topics geared toward better understanding of heart health and risk factor modification. Participant will state understanding/return demonstration of topics presented as noted by education test scores.  Learning Barriers/Preferences:   General Cardiac Education Topics:  AED/CPR: - Group verbal and written instruction with the use of models to demonstrate the basic use of the AED with the basic ABC's of resuscitation.   Anatomy and Cardiac Procedures: - Group verbal and visual presentation and models provide information about basic cardiac anatomy and function. Reviews the testing methods done to diagnose heart disease and the outcomes of the test results. Describes the treatment choices: Medical Management, Angioplasty, or Coronary Bypass Surgery for treating various heart conditions including Myocardial Infarction, Angina, Valve Disease, and Cardiac Arrhythmias.  Written material given at graduation. Flowsheet Row Cardiac Rehab from 02/08/2022 in [facility] Cardiac and  Pulmonary Rehab  Education need identified 02/08/22       Medication Safety: - Group verbal and visual instruction to review commonly prescribed medications for heart and lung disease. Reviews the medication, class of the drug, and side effects. Includes the steps to properly store meds and maintain the prescription regimen.  Written material given at graduation.   Intimacy: - Group verbal instruction through game format to discuss how heart and lung disease can affect sexual intimacy. Written material given at graduation..   Know Your Numbers and Heart Failure: - Group verbal and visual instruction to discuss disease risk factors for cardiac and pulmonary disease and treatment options.  Reviews associated critical values for Overweight/Obesity, Hypertension, Cholesterol, and Diabetes.  Discusses basics of heart failure: signs/symptoms and treatments.  Introduces Heart Failure Zone chart for action plan for heart  failure.  Written material given at graduation.   Infection Prevention: - Provides verbal and written material to individual with discussion of infection control including proper hand washing and proper equipment cleaning during exercise session. Flowsheet Row Cardiac Rehab from 02/08/2022 in [facility] Cardiac and Pulmonary Rehab  Date 02/08/22  Educator [facility]  Instruction Review Code 1- Verbalizes Understanding       Falls Prevention: - Provides verbal and written material to individual with discussion of falls prevention and safety. Flowsheet Row Cardiac Rehab from 02/08/2022 in [facility] Cardiac and Pulmonary Rehab  Date 02/08/22  Educator [facility]  Instruction Review Code 1- Verbalizes Understanding       Other: -Provides group and verbal instruction on various topics (see comments)   Knowledge Questionnaire Score:  Knowledge Questionnaire Score - 02/08/22 1631       Knowledge Questionnaire Score   Pre Score 19/26             Core Components/Risk Factors/Patient Goals at Admission:  Personal Goals and Risk Factors at Admission - 02/08/22 1628       Core Components/Risk Factors/Patient Goals on Admission    Weight Management Yes    Intervention Weight Management: Develop a combined nutrition and exercise program designed to reach desired caloric intake, while maintaining appropriate intake of nutrient and fiber, sodium and fats, and appropriate energy expenditure required for the weight goal.;Weight Management: Provide education and appropriate resources to help participant work on and attain dietary goals.;Weight Management/Obesity: Establish reasonable short term and long term weight goals.;Obesity: Provide education and appropriate resources to help participant work on and attain dietary goals.    Admit Weight 234 lb 1.6 oz (106.2 kg)    Goal Weight: Short Term 228 lb (103.4 kg)    Goal Weight: Long Term 200 lb (90.7 kg)    Expected Outcomes Short Term: Continue to assess and modify  interventions until short term weight is achieved;Long Term: Adherence to nutrition and physical activity/exercise program aimed toward attainment of established weight goal;Weight Loss: Understanding of general recommendations for a balanced deficit meal plan, which promotes 1-2 lb weight loss per week and includes a negative energy balance of [phone] kcal/d;Understanding recommendations for meals to include 15-35% energy as protein, 25-35% energy from fat, 35-60% energy from carbohydrates, less than 262m of dietary cholesterol, 20-35 gm of total fiber daily;Understanding of distribution of calorie intake throughout the day with the consumption of 4-5 meals/snacks    Diabetes Yes    Intervention Provide education about signs/symptoms and action to take for hypo/hyperglycemia.;Provide education about proper nutrition, including hydration, and aerobic/resistive exercise prescription along with prescribed medications to achieve blood glucose in normal ranges: Fasting  glucose 65-99 mg/dL    Expected Outcomes Short Term: Participant verbalizes understanding of the signs/symptoms and immediate care of hyper/hypoglycemia, proper foot care and importance of medication, aerobic/resistive exercise and nutrition plan for blood glucose control.;Long Term: Attainment of HbA1C < 7%.    Hypertension Yes    Intervention Provide education on lifestyle modifcations including regular physical activity/exercise, weight management, moderate sodium restriction and increased consumption of fresh fruit, vegetables, and low fat dairy, alcohol moderation, and smoking cessation.;Monitor prescription use compliance.    Expected Outcomes Short Term: Continued assessment and intervention until BP is < 140/84m HG in hypertensive participants. < 130/848mHG in hypertensive participants with diabetes, heart failure or chronic kidney disease.;Long Term: Maintenance of blood pressure at goal levels.    Lipids Yes    Intervention Provide  education and support for participant on nutrition & aerobic/resistive exercise along with prescribed medications to achieve LDL <704mHDL >33m66m  Expected Outcomes Short Term: Participant states understanding of desired cholesterol values and is compliant with medications prescribed. Participant is following exercise prescription and nutrition guidelines.;Long Term: Cholesterol controlled with medications as prescribed, with individualized exercise RX and with personalized nutrition plan. Value goals: LDL < 70mg38mL > 40 mg.             Education:Diabetes - Individual verbal and written instruction to review signs/symptoms of diabetes, desired ranges of glucose level fasting, after meals and with exercise. Acknowledge that pre and post exercise glucose checks will be done for 3 sessions at entry of program. [place] [facility]  Education need identified 02/08/22       Core Components/Risk Factors/Patient Goals Review:    Core Components/Risk Factors/Patient Goals at Discharge (Final Review):    ITP Comments:  ITP Comments     Row Name 01/19/22 0915 02/08/22 1611 02/10/22 1006       ITP Comments Virtual orientation call completed today. shehas an appointment on Date: 01/26/2022  for EP eval and gym Orientation.  [facility] . Completed 6MWT and gym orientation. Initial ITP created and sent for review to Dr. Mark Emily Filbertical Director. 30 Day review completed. Medical Director ITP review done, changes made as directed, and signed approval by Medical Director.   NEW              Comments:

## 2022-02-11 ENCOUNTER — Encounter: Payer: Self-pay | Admitting: Physician Assistant

## 2022-02-11 ENCOUNTER — Ambulatory Visit (INDEPENDENT_AMBULATORY_CARE_PROVIDER_SITE_OTHER): Payer: Medicare (Managed Care) | Admitting: Physician Assistant

## 2022-02-11 DIAGNOSIS — J441 Chronic obstructive pulmonary disease with (acute) exacerbation: Secondary | ICD-10-CM

## 2022-02-11 DIAGNOSIS — R062 Wheezing: Secondary | ICD-10-CM

## 2022-02-11 DIAGNOSIS — R053 Chronic cough: Secondary | ICD-10-CM

## 2022-02-11 MED ORDER — PREDNISONE 10 MG PO TABS: ORAL_TABLET | ORAL | 0 refills | Status: DC

## 2022-02-11 MED ORDER — ALBUTEROL SULFATE HFA 108 (90 BASE) MCG/ACT IN AERS: 2.0000 | INHALATION_SPRAY | Freq: Four times a day (QID) | RESPIRATORY_TRACT | 6 refills | Status: DC | PRN

## 2022-02-11 NOTE — Progress Notes (Signed)
[facility] [place], [place] [address]  Internal MEDICINE  Office Visit Note  Patient Name: Felicia Woods  [id number]  [id number]  Date of Service: 03/09/2022  Chief Complaint  Patient presents with   Follow-up    PFT   Hypertension   Hyperlipidemia   Diabetes    HPI Pt is here for routine follow up to review PFT and CT chest which was ordered after a visit to the ED where she was diagnosed with acute bronchitis -She is still having coughing and wheezing. -Using Trelegy once daily. Using rescue 4-5 times per day. Doesn't seem to help. -PFT shows mild obstructive lung disease -She is followed by cardiology s/p stent and is doing cardiac rehab now -body hurts all over. Takes tylenol or advil as needed -Dr. Vicente Males prescribed Doxy and she hasnt picked this up yet. Discussed if still having URI/COPD exacerbation then Doxy can help treat this as well and she will pick this up. Will also start course of prednisone and establish with pulmonology  -CT chest 01/27/22: IMPRESSION:  1. Persistent areas of bibasilar scarring and hypoventilatory  change. No acute intrathoracic process.  2. Cardiomegaly.  3.  Aortic Atherosclerosis (ICD10-I70.0).   Current Medication: Outpatient Encounter Medications as of 02/11/2022  Medication Sig   Accu-Chek Softclix Lancets lancets Use as instructed twice a day to check blood sugars  DIAG -E11.59   amLODipine (NORVASC) 10 MG tablet Take 1 tablet (10 mg total) by mouth daily.   aspirin EC 81 MG tablet Take 81 mg by mouth daily.   atorvastatin (LIPITOR) 80 MG tablet Take 1 tablet (80 mg total) by mouth daily.   [EXPIRED] bismuth subsalicylate (PEPTO BISMOL) 262 MG/15ML suspension Take 30 mLs by mouth every 6 (six) hours as needed for up to 14 days.   clarithromycin (BIAXIN) 500 MG tablet Take 1 tablet (500 mg total) by mouth 2 (two) times daily.   clopidogrel (PLAVIX) 75 MG tablet Take 1 tablet (75 mg total) by mouth daily.    cyclobenzaprine (FLEXERIL) 10 MG tablet Take 1 tablet (10 mg total) by mouth at bedtime.   dapagliflozin propanediol (FARXIGA) 10 MG TABS tablet Take 1 tablet (10 mg total) by mouth daily.   doxycycline (ADOXA) 100 MG tablet Take 1 tablet (100 mg total) by mouth 2 (two) times daily.   [EXPIRED] doxycycline (VIBRA-TABS) 100 MG tablet Take 1 tablet (100 mg total) by mouth 2 (two) times daily for 14 days.   Dulaglutide (TRULICITY) 1.5 JM/4.2AS SOPN INJECT 1.5MG INTO THE SKIN ONCE A WEEK   famotidine (PEPCID) 20 MG tablet Take 1 tablet by mouth in the morning and at bedtime.   Fluticasone-Umeclidin-Vilant (TRELEGY ELLIPTA) 100-62.5-25 MCG/ACT AEPB Inhale 1 puff into the lungs daily.   furosemide (LASIX) 40 MG tablet Take 1 tablet (40 mg total) by mouth 2 (two) times daily.   glucose blood (ACCU-CHEK GUIDE) test strip Use as instructed to check blood sugars twice a day  E11.59   ipratropium-albuterol (DUONEB) 0.5-2.5 (3) MG/3ML SOLN Take 3 mLs by nebulization every 4 (four) hours as needed.   isosorbide mononitrate (IMDUR) 60 MG 24 hr tablet Take 1 tablet by mouth daily.   loperamide (IMODIUM A-D) 2 MG tablet Take 2 tablets (4 mg total) by mouth in the morning, at noon, and at bedtime.   losartan (COZAAR) 100 MG tablet Take 1 tablet (100 mg total) by mouth daily. Take one tab po qd   Misc. Devices (ADJUST BATH/SHOWER SEAT) MISC 1 shower  seat for use at home to reduce risk of falling.   montelukast (SINGULAIR) 10 MG tablet Take 1 tablet (10 mg total) by mouth at bedtime.   nitroGLYCERIN (NITROSTAT) 0.4 MG SL tablet DISSOLVE ONE TABLET UNDER THE TONGUE EVERY 5 MINUTES AS NEEDED FOR CHEST PAIN.  DO NOT EXCEED A TOTAL OF 3 DOSES IN 15 MINUTES   omeprazole (PRILOSEC) 20 MG capsule Take 1 capsule (20 mg total) by mouth 2 (two) times daily before a meal.   oxybutynin (DITROPAN-XL) 5 MG 24 hr tablet Take 1 tablet by mouth at bedtime.   predniSONE (DELTASONE) 10 MG tablet Take one tab 3 x day for 3 days, then  take one tab 2 x a day for 3 days and then take one tab a day for 3 days for copd   sertraline (ZOLOFT) 100 MG tablet Take 100 mg by mouth daily.   [DISCONTINUED] albuterol (VENTOLIN HFA) 108 (90 Base) MCG/ACT inhaler Inhale 2 puffs into the lungs every 6 (six) hours as needed for wheezing or shortness of breath.   [DISCONTINUED] zolpidem (AMBIEN) 5 MG tablet TAKE 1 TABLET BY MOUTH AT BEDTIME AS NEEDED FOR SLEEP   albuterol (VENTOLIN HFA) 108 (90 Base) MCG/ACT inhaler Inhale 2 puffs into the lungs every 6 (six) hours as needed for wheezing or shortness of breath.   No facility-administered encounter medications on file as of 02/11/2022.    Surgical History: Past Surgical History:  Procedure Laterality Date   ABDOMINAL HYSTERECTOMY     CARDIAC CATHETERIZATION     CARDIAC CATHETERIZATION N/A 10/18/2015   Procedure: Left Heart Cath and Cors/Grafts Angiography;  Surgeon: Lorretta Harp, MD;  Location: [place] CV [facility];  Service: Cardiovascular;  Laterality: N/A;   CARDIAC CATHETERIZATION N/A 10/18/2015   Procedure: Coronary Stent Intervention;  Surgeon: Lorretta Harp, MD;  Location: [place] CV [facility];  Service: Cardiovascular;  Laterality: N/A;   CARDIAC SURGERY     CHOLECYSTECTOMY     COLONOSCOPY WITH PROPOFOL N/A 12/02/2021   Procedure: COLONOSCOPY WITH PROPOFOL;  Surgeon: Jonathon Bellows, MD;  Location: [facility] ENDOSCOPY;  Service: Gastroenterology;  Laterality: N/A;   CORONARY ANGIOPLASTY     CORONARY ARTERY BYPASS GRAFT     4 vessels - 2010   CORONARY STENT INTERVENTION N/A 02/16/2017   Procedure: CORONARY STENT INTERVENTION;  Surgeon: Yolonda Kida, MD;  Location: [place] CV LAB;  Service: Cardiovascular;  Laterality: N/A;   CORONARY STENT INTERVENTION N/A 02/13/2019   Procedure: CORONARY STENT INTERVENTION;  Surgeon: Nelva Bush, MD;  Location: [place] CV LAB;  Service: Cardiovascular;  Laterality: N/A;  SVG to RCA   CORONARY STENT INTERVENTION Left 03/08/2019    Procedure: CORONARY STENT INTERVENTION;  Surgeon: Yolonda Kida, MD;  Location: [place] CV [facility];  Service: Cardiovascular;  Laterality: Left;   CORONARY STENT INTERVENTION N/A 11/04/2020   Procedure: CORONARY STENT INTERVENTION;  Surgeon: Nelva Bush, MD;  Location: [place] [facility];  Service: Cardiovascular;  Laterality: N/A;   CORONARY STENT INTERVENTION N/A 12/24/2021   Procedure: CORONARY STENT INTERVENTION;  Surgeon: Andrez Grime, MD;  Location: [place] [facility];  Service: Cardiovascular;  Laterality: N/A;   ESOPHAGOGASTRODUODENOSCOPY N/A 12/02/2021   Procedure: ESOPHAGOGASTRODUODENOSCOPY (EGD);  Surgeon: Jonathon Bellows, MD;  Location: [facility] ENDOSCOPY;  Service: Gastroenterology;  Laterality: N/A;   LEFT HEART CATH AND CORONARY ANGIOGRAPHY Left 02/16/2017   Procedure: LEFT HEART CATH AND CORONARY ANGIOGRAPHY;  Surgeon: Corey Skains, MD;  Location: [place] [facility];  Service: Cardiovascular;  Laterality: Left;   LEFT HEART CATH AND CORONARY ANGIOGRAPHY Left 12/24/2021   Procedure: LEFT HEART CATH AND CORONARY ANGIOGRAPHY;  Surgeon: Corey Skains, MD;  Location: [place] [facility];  Service: Cardiovascular;  Laterality: Left;   LEFT HEART CATH AND CORS/GRAFTS ANGIOGRAPHY Left 11/23/2017   Procedure: LEFT HEART CATH AND CORS/GRAFTS ANGIOGRAPHY;  Surgeon: Corey Skains, MD;  Location: [place] [facility];  Service: Cardiovascular;  Laterality: Left;   LEFT HEART CATH AND CORS/GRAFTS ANGIOGRAPHY N/A 02/13/2019   Procedure: LEFT HEART CATH AND CORS/GRAFTS ANGIOGRAPHY;  Surgeon: Corey Skains, MD;  Location: [place] [facility];  Service: Cardiovascular;  Laterality: N/A;   LEFT HEART CATH AND CORS/GRAFTS ANGIOGRAPHY N/A 07/03/2019   Procedure: LEFT HEART CATH AND CORS/GRAFTS ANGIOGRAPHY;  Surgeon: Corey Skains, MD;  Location: [place] [facility];  Service: Cardiovascular;  Laterality: N/A;   LEFT HEART CATH AND CORS/GRAFTS ANGIOGRAPHY N/A 11/04/2020    Procedure: LEFT HEART CATH AND CORS/GRAFTS ANGIOGRAPHY;  Surgeon: Corey Skains, MD;  Location: [place] [facility];  Service: Cardiovascular;  Laterality: N/A;    Medical History: Past Medical History:  Diagnosis Date   Asthma    Coronary artery disease    Diabetes mellitus without complication ([place])    Heart attack ([place])    Hyperlipidemia    Hypertension    MI (myocardial infarction) ([place])    Migraine headache with aura    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    Family History: Family History  Problem Relation Age of Onset   Diabetes Mother    Diabetes Father    Cancer Father    Diabetes Brother     Social History   Socioeconomic History   Marital status: Widowed    Spouse name: Jeneen Rinks   Number of children: Not on file   Years of education: Not on file   Highest education level: Not on file  Occupational History   Not on file  Tobacco Use   Smoking status: Never   Smokeless tobacco: Never  Vaping Use   Vaping Use: Never used  Substance and Sexual Activity   Alcohol use: No    Alcohol/week: 0.0 standard drinks of alcohol   Drug use: No   Sexual activity: Yes  Other Topics Concern   Not on file  Social History Narrative   Not on file   Social Determinants of Health   Financial Resource Strain: Not on file  Food Insecurity: Not on file  Transportation Needs: Not on file  Physical Activity: Not on file  Stress: Not on file  Social Connections: Not on file  Intimate Partner Violence: Not on file      Review of Systems  Constitutional:  Positive for fatigue. Negative for chills and unexpected weight change.  HENT:  Negative for congestion, postnasal drip, rhinorrhea, sneezing and sore throat.   Eyes:  Negative for redness.  Respiratory:  Positive for cough, chest tightness, shortness of breath and wheezing.   Cardiovascular: Negative.  Negative for chest pain and palpitations.  Gastrointestinal:  Negative for abdominal pain, constipation, diarrhea,  nausea and vomiting.  Genitourinary:  Negative for dysuria and frequency.  Musculoskeletal:  Positive for arthralgias. Negative for back pain, joint swelling and neck pain.  Skin:  Negative for rash.  Neurological: Negative.  Negative for tremors and numbness.  Hematological:  Negative for adenopathy. Does not bruise/bleed easily.  Psychiatric/Behavioral:  Negative for behavioral problems (Depression), sleep disturbance and suicidal ideas.  The patient is not nervous/anxious.     Vital Signs: BP 120/82   Pulse 77   Temp 97.8 F (36.6 C)   Resp 16   Ht _0  (1.702 m)   Wt 236 lb (107 kg)   SpO2 98%   BMI 36.96 kg/m    Physical Exam Vitals and nursing note reviewed.  Constitutional:      General: She is not in acute distress.    Appearance: Normal appearance. She is well-developed. She is obese. She is not diaphoretic.  HENT:     Head: Normocephalic and atraumatic.     Mouth/Throat:     Pharynx: No oropharyngeal exudate.  Eyes:     Pupils: Pupils are equal, round, and reactive to light.  Neck:     Thyroid: No thyromegaly.     Vascular: No JVD.     Trachea: No tracheal deviation.  Cardiovascular:     Rate and Rhythm: Normal rate and regular rhythm.     Heart sounds: Normal heart sounds. No murmur heard.    No friction rub. No gallop.  Pulmonary:     Effort: Pulmonary effort is normal. No respiratory distress.     Breath sounds: Wheezing present. No rales.  Abdominal:     General: Bowel sounds are normal.     Palpations: Abdomen is soft.  Musculoskeletal:        General: Normal range of motion.     Cervical back: Normal range of motion and neck supple.  Lymphadenopathy:     Cervical: No cervical adenopathy.  Skin:    General: Skin is warm and dry.  Neurological:     Mental Status: She is alert and oriented to person, place, and time.     Cranial Nerves: No cranial nerve deficit.  Psychiatric:        Thought Content: Thought content normal.        Judgment:  Judgment normal.        Assessment/Plan: 1. Chronic obstructive pulmonary disease with acute exacerbation (HCC) Will start on prednisone and pt is already prescribed Doxycycline which she will pick up now. Continue inhalers as prescribed. Will refer to pulmonology to establish care. - albuterol (VENTOLIN HFA) 108 (90 Base) MCG/ACT inhaler; Inhale 2 puffs into the lungs every 6 (six) hours as needed for wheezing or shortness of breath.  Dispense: 8 g; Refill: 6 - predniSONE (DELTASONE) 10 MG tablet; Take one tab 3 x day for 3 days, then take one tab 2 x a day for 3 days and then take one tab a day for 3 days for copd  Dispense: 18 tablet; Refill: 0 - Ambulatory referral to Pulmonology  2. Persistent cough Will start on prednisone and pt is already prescribed Doxycycline which she will pick up now. Continue inhalers as prescribed. Will refer to pulmonology to establish care. - albuterol (VENTOLIN HFA) 108 (90 Base) MCG/ACT inhaler; Inhale 2 puffs into the lungs every 6 (six) hours as needed for wheezing or shortness of breath.  Dispense: 8 g; Refill: 6 - predniSONE (DELTASONE) 10 MG tablet; Take one tab 3 x day for 3 days, then take one tab 2 x a day for 3 days and then take one tab a day for 3 days for copd  Dispense: 18 tablet; Refill: 0 - Ambulatory referral to Pulmonology  3. Wheezing - albuterol (VENTOLIN HFA) 108 (90 Base) MCG/ACT inhaler; Inhale 2 puffs into the lungs every 6 (six) hours as needed for wheezing or shortness of breath.  Dispense: 8 g; Refill: 6   General Counseling: Aseel verbalizes understanding of the findings of todays visit and agrees with plan of treatment. I have discussed any further diagnostic evaluation that may be needed or ordered today. We also reviewed her medications today. she has been encouraged to call the office with any questions or concerns that should arise related to todays visit.    Orders Placed This Encounter  Procedures   Ambulatory  referral to Pulmonology    Meds ordered this encounter  Medications   albuterol (VENTOLIN HFA) 108 (90 Base) MCG/ACT inhaler    Sig: Inhale 2 puffs into the lungs every 6 (six) hours as needed for wheezing or shortness of breath.    Dispense:  8 g    Refill:  6   predniSONE (DELTASONE) 10 MG tablet    Sig: Take one tab 3 x day for 3 days, then take one tab 2 x a day for 3 days and then take one tab a day for 3 days for copd    Dispense:  18 tablet    Refill:  0    This patient was seen by Drema Dallas, PA-C in collaboration with Dr. Clayborn Bigness as a part of collaborative care agreement.   Total time spent:30 Minutes Time spent includes review of chart, medications, test results, and follow up plan with the patient.      Dr Lavera Guise Internal medicine

## 2022-02-16 ENCOUNTER — Other Ambulatory Visit: Payer: Self-pay | Admitting: Physician Assistant

## 2022-02-16 DIAGNOSIS — G4709 Other insomnia: Secondary | ICD-10-CM

## 2022-02-17 NOTE — Telephone Encounter (Signed)
Last seen 02/16/22 next 9/23

## 2022-02-18 ENCOUNTER — Encounter: Payer: Medicare (Managed Care) | Admitting: *Deleted

## 2022-02-18 DIAGNOSIS — Z955 Presence of coronary angioplasty implant and graft: Secondary | ICD-10-CM

## 2022-02-18 LAB — GLUCOSE, CAPILLARY: Glucose-Capillary: 183 mg/dL — ABNORMAL HIGH (ref 70–99)

## 2022-02-18 NOTE — Progress Notes (Signed)
Daily Session Note  Patient Details  Name: Felicia Woods MRN: [id number] Date of Birth: 1955-08-11 Referring Provider:   Flowsheet Row Cardiac Rehab from 02/08/2022 in [facility] Cardiac and Pulmonary Rehab  Referring Provider Corky Sox       Encounter Date: 02/18/2022  Check In:  Session Check In - 02/18/22 1001       Check-In   Supervising physician immediately available to respond to emergencies See telemetry face sheet for immediately available ER MD    Location [facility]-Cardiac & Pulmonary Rehab    Staff Present Heath Lark, RN, BSN, CCRP;Melissa Wenden, RDN, Tawanna Solo, MS, ASCM CEP, Exercise Physiologist;Noah Tickle, BS, Exercise Physiologist    Virtual Visit No    Medication changes reported     No    Fall or balance concerns reported    No    Warm-up and Cool-down Performed on first and last piece of equipment    Resistance Training Performed Yes    VAD Patient? No    PAD/SET Patient? No      Pain Assessment   Currently in Pain? No/denies                Social History   Tobacco Use  Smoking Status Never  Smokeless Tobacco Never    Goals Met:  Exercise tolerated well Personal goals reviewed No report of concerns or symptoms today  Goals Unmet:  Not Applicable  Comments: First full day of exercise!  Patient was oriented to gym and equipment including functions, settings, policies, and procedures.  Patient's individual exercise prescription and treatment plan were reviewed.  All starting workloads were established based on the results of the 6 minute walk test done at initial orientation visit.  The plan for exercise progression was also introduced and progression will be customized based on patient's performance and goals.    Dr. Emily Filbert is Medical Director for [place].  Dr. Ottie Glazier is Medical Director for [facility] Pulmonary Rehabilitation.

## 2022-02-23 ENCOUNTER — Encounter: Payer: Medicare (Managed Care) | Admitting: *Deleted

## 2022-02-23 DIAGNOSIS — Z955 Presence of coronary angioplasty implant and graft: Secondary | ICD-10-CM | POA: Diagnosis not present

## 2022-02-23 DIAGNOSIS — M25512 Pain in left shoulder: Secondary | ICD-10-CM | POA: Diagnosis not present

## 2022-02-23 DIAGNOSIS — M13862 Other specified arthritis, left knee: Secondary | ICD-10-CM | POA: Diagnosis not present

## 2022-02-23 DIAGNOSIS — M13812 Other specified arthritis, left shoulder: Secondary | ICD-10-CM | POA: Diagnosis not present

## 2022-02-23 LAB — GLUCOSE, CAPILLARY
Glucose-Capillary: 191 mg/dL — ABNORMAL HIGH (ref 70–99)
Glucose-Capillary: 177 mg/dL — ABNORMAL HIGH (ref 70–99)

## 2022-02-23 NOTE — Progress Notes (Signed)
Daily Session Note  Patient Details  Name: Felicia Woods MRN: [id number] Date of Birth: [birth year] Referring Provider:   Flowsheet Row Cardiac Rehab from 02/08/2022 in [facility] Cardiac and Pulmonary Rehab  Referring Provider Corky Sox       Encounter Date: 02/23/2022  Check In:  Session Check In - 02/23/22 1039       Check-In   Supervising physician immediately available to respond to emergencies See telemetry face sheet for immediately available ER MD    Location [facility]Card[facility] Pul[facility] Re[facility]    Staff Present Heath Lark, RN, BSN, CCRP;Jessica [place], MA, RCEP, CCRP, [place], BS, ACSM CEP, Exercise Physiologist    Virtual Visit No    Medication changes reported     No    Fall or balance concerns reported    No    Warm-up and Cool-down Performed on first and last piece of equipment    Resistance Training Performed Yes    VAD Patient? No    PAD/SET Patient? No      Pain Assessment   Currently in Pain? No/denies                Social History   Tobacco Use  Smoking Status Never  Smokeless Tobacco Never    Goals Met:  Independence with exercise equipment Exercise tolerated well No report of concerns or symptoms today  Goals Unmet:  Not Applicable  Comments: Pt able to follow exercise prescription today without complaint.  Will continue to monitor for progression.    Dr. Emily Filbert is Medical Director for [place].  Dr. Ottie Glazier is Medical Director for [facility] Pulmonary Rehabilitation.

## 2022-03-02 ENCOUNTER — Encounter: Payer: Medicare (Managed Care) | Admitting: *Deleted

## 2022-03-02 ENCOUNTER — Ambulatory Visit: Payer: Medicare (Managed Care)

## 2022-03-02 DIAGNOSIS — Z955 Presence of coronary angioplasty implant and graft: Secondary | ICD-10-CM

## 2022-03-02 NOTE — Progress Notes (Signed)
Daily Session Note  Patient Details  Name: Felicia Woods MRN: [id number] Date of Birth: May 04, 1956 Referring Provider:   Flowsheet Row Cardiac Rehab from 02/08/2022 in [facility] Cardiac and Pulmonary Rehab  Referring Provider Corky Sox       Encounter Date: 03/02/2022  Check In:  Session Check In - 03/02/22 1005       Check-In   Supervising physician immediately available to respond to emergencies See telemetry face sheet for immediately available ER MD    Location [facility]-Cardiac & Pulmonary Rehab    Staff Present Nyoka Cowden, RN, BSN, [place], BS, ACSM CEP, Exercise Physiologist;Jessica [place], MA, RCEP, CCRP, CCET    Virtual Visit No    Medication changes reported     No    Fall or balance concerns reported    No    Tobacco Cessation No Change    Warm-up and Cool-down Performed on first and last piece of equipment    Resistance Training Performed Yes    VAD Patient? No    PAD/SET Patient? No      Pain Assessment   Currently in Pain? No/denies                Social History   Tobacco Use  Smoking Status Never  Smokeless Tobacco Never    Goals Met:  Independence with exercise equipment Exercise tolerated well No report of concerns or symptoms today  Goals Unmet:  Not Applicable  Comments: Pt able to follow exercise prescription today without complaint.  Will continue to monitor for progression.    Dr. Emily Filbert is Medical Director for [place].  Dr. Ottie Glazier is Medical Director for [facility] Pulmonary Rehabilitation.

## 2022-03-04 ENCOUNTER — Encounter: Payer: Medicare (Managed Care) | Admitting: *Deleted

## 2022-03-04 DIAGNOSIS — Z955 Presence of coronary angioplasty implant and graft: Secondary | ICD-10-CM | POA: Diagnosis not present

## 2022-03-04 NOTE — Progress Notes (Signed)
Daily Session Note  Patient Details  Name: Felicia Woods MRN: [id number] Date of Birth: 02/11/1956 Referring Provider:   Flowsheet Row Cardiac Rehab from 02/08/2022 in [facility]  Referring Provider [place]       Encounter Date: 03/04/2022  Check In:  Session Check In - 03/04/22 1248       Check-In   Supervising physician immediately available to respond to emergencies See telemetry face sheet for immediately available ER MD    Location [facility]    Staff Present Mary Jo Abernethy, RN, BSN, MA;Joseph Hood, RCP,RRT,BSRT;Melissa Caiola, RDN, LDN    Virtual Visit No    Medication changes reported     No    Fall or balance concerns reported    No    Tobacco Cessation No Change    Warm-up and Cool-down Performed on first and last piece of equipment    Resistance Training Performed Yes    VAD Patient? No      Pain Assessment   Currently in Pain? No/denies                Social History   Tobacco Use  Smoking Status Never  Smokeless Tobacco Never    Goals Met:  Independence with exercise equipment Exercise tolerated well No report of concerns or symptoms today  Goals Unmet:  Not Applicable  Comments: Pt able to follow exercise prescription today without complaint.  Will continue to monitor for progression.    Dr. Mark Miller is Medical Director for [facility].  Dr. Fuad Aleskerov is Medical Director for [facility]. 

## 2022-03-09 ENCOUNTER — Other Ambulatory Visit: Payer: Self-pay

## 2022-03-09 ENCOUNTER — Ambulatory Visit: Payer: Medicare (Managed Care) | Admitting: Internal Medicine

## 2022-03-10 ENCOUNTER — Encounter: Payer: Self-pay | Admitting: *Deleted

## 2022-03-10 DIAGNOSIS — Z955 Presence of coronary angioplasty implant and graft: Secondary | ICD-10-CM

## 2022-03-10 NOTE — Progress Notes (Signed)
Cardiac Individual Treatment Plan  Patient Details  Name: Felicia Woods MRN: [id number] Date of Birth: March 26, 1956 Referring Provider:   Flowsheet Row Cardiac Rehab from 02/08/2022 in [facility] Cardiac and Pulmonary Rehab  Referring Provider Corky Sox       Initial Encounter Date:  Flowsheet Row Cardiac Rehab from 02/08/2022 in [facility] Cardiac and Pulmonary Rehab  Date 02/08/22       Visit Diagnosis: Status post coronary artery stent placement  Patient's Home Medications on Admission:  Current Outpatient Medications:    Accu-Chek Softclix Lancets lancets, Use as instructed twice a day to check blood sugars  DIAG -E11.59, Disp: 100 each, Rfl: 3   albuterol (VENTOLIN HFA) 108 (90 Base) MCG/ACT inhaler, Inhale 2 puffs into the lungs every 6 (six) hours as needed for wheezing or shortness of breath., Disp: 8 g, Rfl: 6   amLODipine (NORVASC) 10 MG tablet, Take 1 tablet by mouth daily., Disp: , Rfl:    aspirin EC 81 MG tablet, Take 81 mg by mouth daily., Disp: , Rfl:    atorvastatin (LIPITOR) 80 MG tablet, Take 1 tablet by mouth daily., Disp: , Rfl:    canagliflozin (INVOKANA) 300 MG TABS tablet, Take 1 tablet by mouth daily before breakfast., Disp: , Rfl:    clopidogrel (PLAVIX) 75 MG tablet, Take 1 tablet by mouth daily., Disp: , Rfl:    cyclobenzaprine (FLEXERIL) 10 MG tablet, Take 1 tablet by mouth at bedtime., Disp: , Rfl:    dapagliflozin propanediol (FARXIGA) 10 MG TABS tablet, Take 1 tablet (10 mg total) by mouth daily., Disp: 90 tablet, Rfl: 1   doxycycline (VIBRA-TABS) 100 MG tablet, Take 1 tablet by mouth daily., Disp: , Rfl:    Dulaglutide (TRULICITY) 1.5 LH/7.3SK SOPN, INJECT 1.5MG INTO THE SKIN ONCE A WEEK, Disp: 12 mL, Rfl: 1   empagliflozin (JARDIANCE) 25 MG TABS tablet, Take 1 tablet by mouth daily., Disp: , Rfl:    Ergocalciferol (VITAMIN D2) 10 MCG (400 UNIT) TABS, Take 1 capsule by mouth once a week., Disp: , Rfl:    famotidine (PEPCID) 20 MG tablet, Take 1 tablet by mouth 2 (two)  times daily., Disp: , Rfl:    Fluticasone-Umeclidin-Vilant (TRELEGY ELLIPTA) 100-62.5-25 MCG/ACT AEPB, INHALE 1 PUFF INTO THE LUNGS ONCE DAILY, Disp: , Rfl:    furosemide (LASIX) 40 MG tablet, Take 1 tablet (40 mg total) by mouth 2 (two) times daily., Disp: 180 tablet, Rfl: 1   glucose blood (ACCU-CHEK GUIDE) test strip, Use as instructed to check blood sugars twice a day  E11.59, Disp: 100 each, Rfl: 3   isosorbide mononitrate (IMDUR) 60 MG 24 hr tablet, Take 1 tablet by mouth daily., Disp: , Rfl:    loperamide (IMODIUM A-D) 2 MG tablet, Take 2 tablets (4 mg total) by mouth in the morning, at noon, and at bedtime., Disp: 540 tablet, Rfl: 0   losartan (COZAAR) 100 MG tablet, Take 1 tablet by mouth daily., Disp: , Rfl:    metoprolol succinate (TOPROL-XL) 25 MG 24 hr tablet, Take 1 tablet by mouth daily., Disp: , Rfl:    Misc. Devices (ADJUST BATH/SHOWER SEAT) MISC, 1 shower seat for use at home to reduce risk of falling., Disp: 1 each, Rfl: 0   montelukast (SINGULAIR) 10 MG tablet, Take 1 tablet by mouth at bedtime., Disp: , Rfl:    nitroGLYCERIN (NITROSTAT) 0.4 MG SL tablet, DISSOLVE ONE TABLET UNDER THE TONGUE EVERY 5 MINUTES AS NEEDED FOR CHEST PAIN.  DO NOT EXCEED A TOTAL OF 3 DOSES  IN 15 MINUTES, Disp: 30 tablet, Rfl: 3   Omeprazole 20 MG TBEC, TAKE 1 CAPSULE BY MOUTH TWICE DAILY BEFORE A MEAL FOR 14 DAYS, Disp: , Rfl:    oxybutynin (DITROPAN) 5 MG tablet, Take 1 tablet by mouth daily., Disp: , Rfl:    oxyCODONE-acetaminophen (PERCOCET/ROXICET) 5-325 MG tablet, Take 1 tablet by mouth every 4 (four) hours as needed., Disp: , Rfl:    predniSONE (DELTASONE) 10 MG tablet, Take one tab 3 x day for 3 days, then take one tab 2 x a day for 3 days and then take one tab a day for 3 days for copd, Disp: 18 tablet, Rfl: 0   sertraline (ZOLOFT) 100 MG tablet, Take 1 tablet by mouth daily., Disp: , Rfl:    zolpidem (AMBIEN) 5 MG tablet, TAKE 1 TABLET BY MOUTH AT BEDTIME AS NEEDED FOR SLEEP, Disp: 30 tablet,  Rfl: 0  Past Medical History: Past Medical History:  Diagnosis Date   Asthma    Coronary artery disease    Diabetes mellitus without complication ([place])    Heart attack ([place])    Hyperlipidemia    Hypertension    MI (myocardial infarction) ([place])    Migraine headache with aura    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    Tobacco Use: Social History   Tobacco Use  Smoking Status Never  Smokeless Tobacco Never    Labs: Review Flowsheet  More data exists      Latest Ref Rng & Units 02/19/2021 06/10/2021 09/03/2021 09/11/2021 12/03/2021  Labs for ITP Cardiac and Pulmonary Rehab  Cholestrol 0 - 200 mg/dL - 181  - 243  -  LDL (calc) 0 - 99 mg/dL - 125  - 181  -  HDL-C >40 mg/dL - 32  - 50  -  Trlycerides <150 mg/dL - 121  - 62  -  Hemoglobin A1c 4.0 - 5.6 % 7.0  8.2  7.1  - 7.2      Exercise Target Goals: Exercise Program Goal: Individual exercise prescription set using results from initial 6 min walk test and THRR while considering  patient's activity barriers and safety.   Exercise Prescription Goal: Initial exercise prescription builds to 30-45 minutes a day of aerobic activity, 2-3 days per week.  Home exercise guidelines will be given to patient during program as part of exercise prescription that the participant will acknowledge.   Education: Aerobic Exercise: - Group verbal and visual presentation on the components of exercise prescription. Introduces F.I.T.T principle from ACSM for exercise prescriptions.  Reviews F.I.T.T. principles of aerobic exercise including progression. Written material given at graduation. Flowsheet Row Cardiac Rehab from 02/08/2022 in [facility] Cardiac and Pulmonary Rehab  Education need identified 02/08/22       Education: Resistance Exercise: - Group verbal and visual presentation on the components of exercise prescription. Introduces F.I.T.T principle from ACSM for exercise prescriptions  Reviews F.I.T.T. principles of resistance exercise including  progression. Written material given at graduation.    Education: Exercise & Equipment Safety: - Individual verbal instruction and demonstration of equipment use and safety with use of the equipment. Flowsheet Row Cardiac Rehab from 02/08/2022 in [facility] Cardiac and Pulmonary Rehab  Date 02/08/22  Educator [facility]  Instruction Review Code 1- Verbalizes Understanding       Education: Exercise Physiology & General Exercise Guidelines: - Group verbal and written instruction with models to review the exercise physiology of the cardiovascular system and associated critical values. Provides general exercise guidelines with specific  guidelines to those with heart or lung disease.    Education: Flexibility, Balance, Mind/Body Relaxation: - Group verbal and visual presentation with interactive activity on the components of exercise prescription. Introduces F.I.T.T principle from ACSM for exercise prescriptions. Reviews F.I.T.T. principles of flexibility and balance exercise training including progression. Also discusses the mind body connection.  Reviews various relaxation techniques to help reduce and manage stress (i.e. Deep breathing, progressive muscle relaxation, and visualization). Balance handout provided to take home. Written material given at graduation.   Activity Barriers & Risk Stratification:  Activity Barriers & Cardiac Risk Stratification - 01/19/22 0907       Activity Barriers & Cardiac Risk Stratification   Activity Barriers Arthritis;Joint Problems;Shortness of Breath    Cardiac Risk Stratification High             6 Minute Walk:  6 Minute Walk     Row Name 02/08/22 1612         6 Minute Walk   Phase Initial     Distance 665 feet     Walk Time 5.5 minutes     # of Rest Breaks 1     MPH 1.38     METS 1.65     RPE 12     Perceived Dyspnea  1     VO2 Peak 5.77     Symptoms Yes (comment)     Comments Chest pain 7/10 during walk. Relieved with rest and then resumed walk  with no chest pain.     Resting HR 84 bpm     Resting BP 110/60     Resting Oxygen Saturation  97 %     Exercise Oxygen Saturation  during 6 min walk 97 %     Max Ex. HR 108 bpm     Max Ex. BP 138/60     2 Minute Post BP 118/78              Oxygen Initial Assessment:   Oxygen Re-Evaluation:   Oxygen Discharge (Final Oxygen Re-Evaluation):   Initial Exercise Prescription:  Initial Exercise Prescription - 02/08/22 1600       Date of Initial Exercise RX and Referring Provider   Date 02/08/22    Referring Provider Corky Sox      Oxygen   Maintain Oxygen Saturation 88% or higher      NuStep   Level 1    SPM 80    Minutes 15    METs 1.65      Arm Ergometer   Level 1    RPM 30    Minutes 15    METs 1.65      REL-XR   Level 1    Speed 50    Minutes 15    METs 1.65      Biostep-RELP   Level 1    SPM 50    Minutes 15    METs 1.65      Track   Laps 7    Minutes 15    METs 1.38      Intensity   THRR 40-80% of Max Heartrate 112-141    Ratings of Perceived Exertion 11-13    Perceived Dyspnea 0-4      Progression   Progression Continue to progress workloads to maintain intensity without signs/symptoms of physical distress.      Resistance Training   Training Prescription Yes    Weight 3    Reps 10-15  Perform Capillary Blood Glucose checks as needed.  Exercise Prescription Changes:   Exercise Prescription Changes     Row Name 02/08/22 1600 03/02/22 1500           Response to Exercise   Blood Pressure (Admit) 110/60 124/82      Blood Pressure (Exercise) 138/60 134/70      Blood Pressure (Exit) 118/78 120/80      Heart Rate (Admit) 84 bpm 83 bpm      Heart Rate (Exercise) 108 bpm 131 bpm      Heart Rate (Exit) 85 bpm 86 bpm      Oxygen Saturation (Admit) 97 % --      Oxygen Saturation (Exercise) 97 % --      Oxygen Saturation (Exit) 97 % --      Rating of Perceived Exertion (Exercise) 12 15      Perceived Dyspnea  (Exercise) 1 --      Symptoms Chest pain 7/10 relieved with rest. Resumed walk test and finished with no more pain. knee pain, fatigue      Comments 6MWT results 2nd full day of exercise      Duration -- Progress to 30 minutes of  aerobic without signs/symptoms of physical distress      Intensity -- THRR unchanged        Progression   Progression -- Continue to progress workloads to maintain intensity without signs/symptoms of physical distress.      Average METs -- 2.19        Resistance Training   Training Prescription -- Yes      Weight -- 3 lb      Reps -- 10-15        Interval Training   Interval Training -- No        Arm Ergometer   Level -- 1      Minutes -- 15      METs -- 2.4        Track   Laps -- 20      Minutes -- 15      METs -- 2.09               Exercise Comments:   Exercise Comments     Row Name 02/18/22 1001           Exercise Comments First full day of exercise!  Patient was oriented to gym and equipment including functions, settings, policies, and procedures.  Patient's individual exercise prescription and treatment plan were reviewed.  All starting workloads were established based on the results of the 6 minute walk test done at initial orientation visit.  The plan for exercise progression was also introduced and progression will be customized based on patient's performance and goals.                Exercise Goals and Review:   Exercise Goals     Row Name 02/08/22 1624             Exercise Goals   Increase Physical Activity Yes       Intervention Provide advice, education, support and counseling about physical activity/exercise needs.;Develop an individualized exercise prescription for aerobic and resistive training based on initial evaluation findings, risk stratification, comorbidities and participant's personal goals.       Expected Outcomes Short Term: Attend rehab on a regular basis to increase amount of physical activity.;Long  Term: Add in home exercise to make exercise part of routine and to increase amount of  physical activity.;Long Term: Exercising regularly at least 3-5 days a week.       Increase Strength and Stamina Yes       Intervention Provide advice, education, support and counseling about physical activity/exercise needs.;Develop an individualized exercise prescription for aerobic and resistive training based on initial evaluation findings, risk stratification, comorbidities and participant's personal goals.       Expected Outcomes Short Term: Increase workloads from initial exercise prescription for resistance, speed, and METs.;Short Term: Perform resistance training exercises routinely during rehab and add in resistance training at home;Long Term: Improve cardiorespiratory fitness, muscular endurance and strength as measured by increased METs and functional capacity (6MWT)       Able to understand and use rate of perceived exertion (RPE) scale Yes       Intervention Provide education and explanation on how to use RPE scale       Expected Outcomes Short Term: Able to use RPE daily in rehab to express subjective intensity level;Long Term:  Able to use RPE to guide intensity level when exercising independently       Able to understand and use Dyspnea scale Yes       Intervention Provide education and explanation on how to use Dyspnea scale       Expected Outcomes Short Term: Able to use Dyspnea scale daily in rehab to express subjective sense of shortness of breath during exertion;Long Term: Able to use Dyspnea scale to guide intensity level when exercising independently       Knowledge and understanding of Target Heart Rate Range (THRR) Yes       Intervention Provide education and explanation of THRR including how the numbers were predicted and where they are located for reference       Expected Outcomes Short Term: Able to state/look up THRR;Short Term: Able to use daily as guideline for intensity in rehab;Long  Term: Able to use THRR to govern intensity when exercising independently       Able to check pulse independently Yes       Intervention Provide education and demonstration on how to check pulse in carotid and radial arteries.;Review the importance of being able to check your own pulse for safety during independent exercise       Expected Outcomes Short Term: Able to explain why pulse checking is important during independent exercise;Long Term: Able to check pulse independently and accurately       Understanding of Exercise Prescription Yes       Intervention Provide education, explanation, and written materials on patient's individual exercise prescription       Expected Outcomes Short Term: Able to explain program exercise prescription;Long Term: Able to explain home exercise prescription to exercise independently                Exercise Goals Re-Evaluation :  Exercise Goals Re-Evaluation     Row Name 02/18/22 1002 03/02/22 1521           Exercise Goal Re-Evaluation   Exercise Goals Review Able to understand and use rate of perceived exertion (RPE) scale;Knowledge and understanding of Target Heart Rate Range (THRR);Able to understand and use Dyspnea scale;Able to check pulse independently;Understanding of Exercise Prescription Increase Physical Activity;Increase Strength and Stamina;Understanding of Exercise Prescription      Comments Reviewed RPE and dyspnea scales, THR and program prescription with pt today.  Pt voiced understanding and was given a copy of goals to take home. Vicke is off to a good start in rehab.  She has only worked on the arm ergometer and track so far, however, was able to complete full 20 laps on the track! Her HR reached her THR. We will continue to monitor her progress as she progresses in the program.      Expected Outcomes Short: Use RPE daily to regulate intensity. Long: Follow program prescription in THR. Short: Continue to work up laps on the track Long: Work  on increasing overall MET level               Discharge Exercise Prescription (Final Exercise Prescription Changes):  Exercise Prescription Changes - 03/02/22 1500       Response to Exercise   Blood Pressure (Admit) 124/82    Blood Pressure (Exercise) 134/70    Blood Pressure (Exit) 120/80    Heart Rate (Admit) 83 bpm    Heart Rate (Exercise) 131 bpm    Heart Rate (Exit) 86 bpm    Rating of Perceived Exertion (Exercise) 15    Symptoms knee pain, fatigue    Comments 2nd full day of exercise    Duration Progress to 30 minutes of  aerobic without signs/symptoms of physical distress    Intensity THRR unchanged      Progression   Progression Continue to progress workloads to maintain intensity without signs/symptoms of physical distress.    Average METs 2.19      Resistance Training   Training Prescription Yes    Weight 3 lb    Reps 10-15      Interval Training   Interval Training No      Arm Ergometer   Level 1    Minutes 15    METs 2.4      Track   Laps 20    Minutes 15    METs 2.09             Nutrition:  Target Goals: Understanding of nutrition guidelines, daily intake of sodium <1544m, cholesterol <2030m calories 30% from fat and 7% or less from saturated fats, daily to have 5 or more servings of fruits and vegetables.  Education: All About Nutrition: -Group instruction provided by verbal, written material, interactive activities, discussions, models, and posters to present general guidelines for heart healthy nutrition including fat, fiber, MyPlate, the role of sodium in heart healthy nutrition, utilization of the nutrition label, and utilization of this knowledge for meal planning. Follow up email sent as well. Written material given at graduation. [facility]  Education need identified 02/08/22       Biometrics:  Pre Biometrics - 02/08/22 1627       Pre Biometrics   Height 5'  5.75" (1.67 m)    Weight 234 lb 1.6 oz (106.2 kg)    BMI (Calculated) 38.07    Single Leg Stand 6.6 seconds              Nutrition Therapy Plan and Nutrition Goals:  Nutrition Therapy & Goals - 02/08/22 1628       Intervention Plan   Intervention Prescribe, educate and counsel regarding individualized specific dietary modifications aiming towards targeted core components such as weight, hypertension, lipid management, diabetes, heart failure and other comorbidities.    Expected Outcomes Short Term Goal: Understand basic principles of dietary content, such as calories, fat, sodium, cholesterol and nutrients.;Short Term Goal: A plan has been developed with personal nutrition goals set during dietitian appointment.;Long Term Goal: Adherence to prescribed nutrition plan.  Nutrition Assessments:  MEDIFICTS Score Key: ?70 Need to make dietary changes  40-70 Heart Healthy Diet ? 40 Therapeutic Level Cholesterol Diet  Flowsheet Row Cardiac Rehab from 02/08/2022 in [facility] Cardiac and Pulmonary Rehab  Picture Your Plate Total Score on Admission 41      Picture Your Plate Scores: <01 Unhealthy dietary pattern with much room for improvement. 41-50 Dietary pattern unlikely to meet recommendations for good health and room for improvement. 51-60 More healthful dietary pattern, with some room for improvement.  >60 Healthy dietary pattern, although there may be some specific behaviors that could be improved.    Nutrition Goals Re-Evaluation:   Nutrition Goals Discharge (Final Nutrition Goals Re-Evaluation):   Psychosocial: Target Goals: Acknowledge presence or absence of significant depression and/or stress, maximize coping skills, provide positive support system. Participant is able to verbalize types and ability to use techniques and skills needed for reducing stress and depression.   Education: Stress, Anxiety, and Depression - Group verbal and visual presentation to  define topics covered.  Reviews how body is impacted by stress, anxiety, and depression.  Also discusses healthy ways to reduce stress and to treat/manage anxiety and depression.  Written material given at graduation. Flowsheet Row Cardiac Rehab from 02/08/2022 in [facility] Cardiac and Pulmonary Rehab  Education need identified 02/08/22       Education: Sleep Hygiene -Provides group verbal and written instruction about how sleep can affect your health.  Define sleep hygiene, discuss sleep cycles and impact of sleep habits. Review good sleep hygiene tips.    Initial Review & Psychosocial Screening:  Initial Psych Review & Screening - 01/19/22 0908       Initial Review   Current issues with None Identified      Family Dynamics   Good Support System? Yes   husband and her children     Barriers   Psychosocial barriers to participate in program There are no identifiable barriers or psychosocial needs.;The patient should benefit from training in stress management and relaxation.      Screening Interventions   Interventions Encouraged to exercise;To provide support and resources with identified psychosocial needs;Provide feedback about the scores to participant    Expected Outcomes Short Term goal: Utilizing psychosocial counselor, staff and physician to assist with identification of specific Stressors or current issues interfering with healing process. Setting desired goal for each stressor or current issue identified.;Long Term Goal: Stressors or current issues are controlled or eliminated.;Short Term goal: Identification and review with participant of any Quality of Life or Depression concerns found by scoring the questionnaire.;Long Term goal: The participant improves quality of Life and PHQ9 Scores as seen by post scores and/or verbalization of changes             Quality of Life Scores:   Quality of Life - 02/08/22 1630       Quality of Life   Select Quality of Life      Quality of  Life Scores   Health/Function Pre 23.93 %    Socioeconomic Pre 28.17 %    Psych/Spiritual Pre 30 %    Family Pre 30 %    GLOBAL Pre 27 %            Scores of 19 and below usually indicate a poorer quality of life in these areas.  A difference of  2-3 points is a clinically meaningful difference.  A difference of 2-3 points in the total score of the Quality of Life Index has been associated  with significant improvement in overall quality of life, self-image, physical symptoms, and general health in studies assessing change in quality of life.  PHQ-9: Review Flowsheet  More data exists      02/08/2022 12/03/2021 09/03/2021 03/12/2021 02/19/2021  Depression screen PHQ 2/9  Decreased Interest 1 0 0 0 0  Down, Depressed, Hopeless 0 0 0 0 0  PHQ - 2 Score 1 0 0 0 0  Altered sleeping 0 - - - -  Tired, decreased energy 0 - - - -  Change in appetite 0 - - - -  Feeling bad or failure about yourself  0 - - - -  Trouble concentrating 0 - - - -  Moving slowly or fidgety/restless 0 - - - -  Suicidal thoughts 0 - - - -  PHQ-9 Score 1 - - - -  Difficult doing work/chores Not difficult at all - - - -   Interpretation of Total Score  Total Score Depression Severity:  1-4 = Minimal depression, 5-9 = Mild depression, 10-14 = Moderate depression, 15-19 = Moderately severe depression, 20-27 = Severe depression   Psychosocial Evaluation and Intervention:  Psychosocial Evaluation - 01/19/22 0912       Psychosocial Evaluation & Interventions   Comments Tosha is again returning to [facility] after another stent. SHe has no barriers to attending the program. She now lives in a house and no longer in a second floor apartment. Her support team is her husband and her children. She is ready to return and continue to learn heart healthy living.    Expected Outcomes STG Marlisha will attend all scheduled sessions, she will progress with her exercise LTG Ople will be able to continue with ehr exercie progression and  maintain control of her risk factors.    Continue Psychosocial Services  Follow up required by staff             Psychosocial Re-Evaluation:   Psychosocial Discharge (Final Psychosocial Re-Evaluation):   Vocational Rehabilitation: Provide vocational rehab assistance to qualifying candidates.   Vocational Rehab Evaluation & Intervention:  Vocational Rehab - 01/19/22 0912       Initial Vocational Rehab Evaluation & Intervention   Assessment shows need for Vocational Rehabilitation No      Vocational Rehab Re-Evaulation   Comments no request fro VR             Education: Education Goals: Education classes will be provided on a variety of topics geared toward better understanding of heart health and risk factor modification. Participant will state understanding/return demonstration of topics presented as noted by education test scores.  Learning Barriers/Preferences:   General Cardiac Education Topics:  AED/CPR: - Group verbal and written instruction with the use of models to demonstrate the basic use of the AED with the basic ABC's of resuscitation.   Anatomy and Cardiac Procedures: - Group verbal and visual presentation and models provide information about basic cardiac anatomy and function. Reviews the testing methods done to diagnose heart disease and the outcomes of the test results. Describes the treatment choices: Medical Management, Angioplasty, or Coronary Bypass Surgery for treating various heart conditions including Myocardial Infarction, Angina, Valve Disease, and Cardiac Arrhythmias.  Written material given at graduation. Flowsheet Row Cardiac Rehab from 02/08/2022 in [facility] Cardiac and Pulmonary Rehab  Education need identified 02/08/22       Medication Safety: - Group verbal and visual instruction to review commonly prescribed medications for heart and lung disease. Reviews the medication, class of the drug,  and side effects. Includes the steps to  properly store meds and maintain the prescription regimen.  Written material given at graduation.   Intimacy: - Group verbal instruction through game format to discuss how heart and lung disease can affect sexual intimacy. Written material given at graduation..   Know Your Numbers and Heart Failure: - Group verbal and visual instruction to discuss disease risk factors for cardiac and pulmonary disease and treatment options.  Reviews associated critical values for Overweight/Obesity, Hypertension, Cholesterol, and Diabetes.  Discusses basics of heart failure: signs/symptoms and treatments.  Introduces Heart Failure Zone chart for action plan for heart failure.  Written material given at graduation.   Infection Prevention: - Provides verbal and written material to individual with discussion of infection control including proper hand washing and proper equipment cleaning during exercise session. Flowsheet Row Cardiac Rehab from 02/08/2022 in [facility] Cardiac and Pulmonary Rehab  Date 02/08/22  Educator [facility]  Instruction Review Code 1- Verbalizes Understanding       Falls Prevention: - Provides verbal and written material to individual with discussion of falls prevention and safety. Flowsheet Row Cardiac Rehab from 02/08/2022 in [facility] Cardiac and Pulmonary Rehab  Date 02/08/22  Educator [facility]  Instruction Review Code 1- Verbalizes Understanding       Other: -Provides group and verbal instruction on various topics (see comments)   Knowledge Questionnaire Score:  Knowledge Questionnaire Score - 02/08/22 1631       Knowledge Questionnaire Score   Pre Score 19/26             Core Components/Risk Factors/Patient Goals at Admission:  Personal Goals and Risk Factors at Admission - 02/08/22 1628       Core Components/Risk Factors/Patient Goals on Admission    Weight Management Yes    Intervention Weight Management: Develop a combined nutrition and exercise program designed to reach  desired caloric intake, while maintaining appropriate intake of nutrient and fiber, sodium and fats, and appropriate energy expenditure required for the weight goal.;Weight Management: Provide education and appropriate resources to help participant work on and attain dietary goals.;Weight Management/Obesity: Establish reasonable short term and long term weight goals.;Obesity: Provide education and appropriate resources to help participant work on and attain dietary goals.    Admit Weight 234 lb 1.6 oz (106.2 kg)    Goal Weight: Short Term 228 lb (103.4 kg)    Goal Weight: Long Term 200 lb (90.7 kg)    Expected Outcomes Short Term: Continue to assess and modify interventions until short term weight is achieved;Long Term: Adherence to nutrition and physical activity/exercise program aimed toward attainment of established weight goal;Weight Loss: Understanding of general recommendations for a balanced deficit meal plan, which promotes 1-2 lb weight loss per week and includes a negative energy balance of [phone] kcal/d;Understanding recommendations for meals to include 15-35% energy as protein, 25-35% energy from fat, 35-60% energy from carbohydrates, less than 254m of dietary cholesterol, 20-35 gm of total fiber daily;Understanding of distribution of calorie intake throughout the day with the consumption of 4-5 meals/snacks    Diabetes Yes    Intervention Provide education about signs/symptoms and action to take for hypo/hyperglycemia.;Provide education about proper nutrition, including hydration, and aerobic/resistive exercise prescription along with prescribed medications to achieve blood glucose in normal ranges: Fasting glucose 65-99 mg/dL    Expected Outcomes Short Term: Participant verbalizes understanding of the signs/symptoms and immediate care of hyper/hypoglycemia, proper foot care and importance of medication, aerobic/resistive exercise and nutrition plan for blood glucose control.;Long Term:  Attainment of HbA1C < 7%.    Hypertension Yes    Intervention Provide education on lifestyle modifcations including regular physical activity/exercise, weight management, moderate sodium restriction and increased consumption of fresh fruit, vegetables, and low fat dairy, alcohol moderation, and smoking cessation.;Monitor prescription use compliance.    Expected Outcomes Short Term: Continued assessment and intervention until BP is < 140/38m HG in hypertensive participants. < 130/836mHG in hypertensive participants with diabetes, heart failure or chronic kidney disease.;Long Term: Maintenance of blood pressure at goal levels.    Lipids Yes    Intervention Provide education and support for participant on nutrition & aerobic/resistive exercise along with prescribed medications to achieve LDL <7084mHDL >21m35m  Expected Outcomes Short Term: Participant states understanding of desired cholesterol values and is compliant with medications prescribed. Participant is following exercise prescription and nutrition guidelines.;Long Term: Cholesterol controlled with medications as prescribed, with individualized exercise RX and with personalized nutrition plan. Value goals: LDL < 70mg16mL > 40 mg.             Education:Diabetes - Individual verbal and written instruction to review signs/symptoms of diabetes, desired ranges of glucose level fasting, after meals and with exercise. Acknowledge that pre and post exercise glucose checks will be done for 3 sessions at entry of program. [place] [facility]  Education need identified 02/08/22       Core Components/Risk Factors/Patient Goals Review:    Core Components/Risk Factors/Patient Goals at Discharge (Final Review):    ITP Comments:  ITP Comments     Row Name 01/19/22 0915 02/08/22 1611 02/10/22 1006 02/18/22 1001 03/10/22 1415   ITP Comments Virtual orientation call completed today.  shehas an appointment on Date: 01/26/2022  for EP eval and gym Orientation.  [facility] . Completed 6MWT and gym orientation. Initial ITP created and sent for review to Dr. Mark Emily Filbertical Director. 30 Day review completed. Medical Director ITP review done, changes made as directed, and signed approval by Medical Director.   NEW First full day of exercise!  Patient was oriented to gym and equipment including functions, settings, policies, and procedures.  Patient's individual exercise prescription and treatment plan were reviewed.  All starting workloads were established based on the results of the 6 minute walk test done at initial orientation visit.  The plan for exercise progression was also introduced and progression will be customized based on patient's performance and goals. 30 Day review completed. Medical Director ITP review done, changes made as directed, and signed approval by Medical Director.   NEW            Comments:

## 2022-03-11 ENCOUNTER — Ambulatory Visit: Payer: Medicare (Managed Care) | Admitting: Gastroenterology

## 2022-03-11 ENCOUNTER — Encounter: Payer: Medicare (Managed Care) | Attending: Cardiology | Admitting: *Deleted

## 2022-03-11 DIAGNOSIS — Z955 Presence of coronary angioplasty implant and graft: Secondary | ICD-10-CM | POA: Insufficient documentation

## 2022-03-11 DIAGNOSIS — Z48812 Encounter for surgical aftercare following surgery on the circulatory system: Secondary | ICD-10-CM | POA: Insufficient documentation

## 2022-03-11 NOTE — Progress Notes (Signed)
Daily Session Note  Patient Details  Name: Felicia Woods MRN: [id number] Date of Birth: 05-23-1956 Referring Provider:   Flowsheet Row Cardiac Rehab from 02/08/2022 in [facility] Cardiac and Pulmonary Rehab  Referring Provider Corky Sox       Encounter Date: 03/11/2022  Check In:  Session Check In - 03/11/22 1033       Check-In   Supervising physician immediately available to respond to emergencies See telemetry face sheet for immediately available ER MD    Location [facility]-Cardiac & Pulmonary Rehab    Staff Present Heath Lark, RN, BSN, CCRP;Joseph Haring, RCP,RRT,BSRT;Kelly [place], [place], ACSM CEP, Exercise Physiologist    Virtual Visit No    Medication changes reported     No    Fall or balance concerns reported    No    Warm-up and Cool-down Performed on first and last piece of equipment    Resistance Training Performed Yes    VAD Patient? No    PAD/SET Patient? No      Pain Assessment   Currently in Pain? No/denies                Social History   Tobacco Use  Smoking Status Never  Smokeless Tobacco Never    Goals Met:  Independence with exercise equipment Exercise tolerated well No report of concerns or symptoms today  Goals Unmet:  Not Applicable  Comments: Pt able to follow exercise prescription today without complaint.  Will continue to monitor for progression. Felicia Woods is using nitro sl at least 2 x a day for chest pain with exertion. Advised her to notify MD of this. Also gave her another med name  to review with her physician for angina control.   Dr. Emily Filbert is Medical Director for [place].  Dr. Ottie Glazier is Medical Director for [facility] Pulmonary Rehabilitation.

## 2022-03-11 NOTE — Progress Notes (Deleted)
Jonathon Bellows MD, MRCP(U.K) [address]  [place], [place] [address]  Main: [phone]  Fax: [phone]   Primary Care Physician: Mylinda Latina, PA-C  Primary Gastroenterologist:  Dr. Jonathon Bellows   No chief complaint on file.   HPI: Felicia Woods is a 66 y.o. female Summary of history :   Seen in 11/2021 for fecal incontinence ongoing for a few months . Has noticed that she leaks stool often before she can reach the restroom or when she coughs along with urine as well.  She has had 3 children by vaginal deliveries first when she was in prolonged labor.  She states that she continues to have fecal incontinence and has a bowel movement without her knowledge also has dribbling of urine.  She recollects she was in labor for 14 hours during her first pregnancy and had perineal tear.  Not taking any Imodium at this point of time.Lost about 10 pounds of weight unintentionally recently no upper abdominal discomfort no change in bowel habits.  No blood in the stool.  Denies any bulging sensation per vagina.  Denies any nosebleeds blood in the urine or any hematemesis. 12/02/2021:colonoscopy : Poor anal tone, single 5 mm polyp resected. EGD: 10 mm gastric polyp taken out.Benign polyp     Interval history   01/26/2022-03/11/2022   11/27/2021: Ferritin, iron studies,urine, normal ,    H pylori breath test positive: Commenced on Quadruple therapy with bismuth     Weight stable since last visit    Current Outpatient Medications  Medication Sig Dispense Refill   Accu-Chek Softclix Lancets lancets Use as instructed twice a day to check blood sugars  DIAG -E11.59 100 each 3   albuterol (VENTOLIN HFA) 108 (90 Base) MCG/ACT inhaler Inhale 2 puffs into the lungs every 6 (six) hours as needed for wheezing or shortness of breath. 8 g 6   amLODipine (NORVASC) 10 MG tablet Take 1 tablet by mouth daily.     aspirin EC 81 MG tablet Take 81 mg by mouth daily.     atorvastatin (LIPITOR)  80 MG tablet Take 1 tablet by mouth daily.     canagliflozin (INVOKANA) 300 MG TABS tablet Take 1 tablet by mouth daily before breakfast.     clopidogrel (PLAVIX) 75 MG tablet Take 1 tablet by mouth daily.     cyclobenzaprine (FLEXERIL) 10 MG tablet Take 1 tablet by mouth at bedtime.     dapagliflozin propanediol (FARXIGA) 10 MG TABS tablet Take 1 tablet (10 mg total) by mouth daily. 90 tablet 1   doxycycline (VIBRA-TABS) 100 MG tablet Take 1 tablet by mouth daily.     Dulaglutide (TRULICITY) 1.5 VQ/0.0QQ SOPN INJECT 1.'5MG'$  INTO THE SKIN ONCE A WEEK 12 mL 1   empagliflozin (JARDIANCE) 25 MG TABS tablet Take 1 tablet by mouth daily.     Ergocalciferol (VITAMIN D2) 10 MCG (400 UNIT) TABS Take 1 capsule by mouth once a week.     famotidine (PEPCID) 20 MG tablet Take 1 tablet by mouth 2 (two) times daily.     Fluticasone-Umeclidin-Vilant (TRELEGY ELLIPTA) 100-62.5-25 MCG/ACT AEPB INHALE 1 PUFF INTO THE LUNGS ONCE DAILY     furosemide (LASIX) 40 MG tablet Take 1 tablet (40 mg total) by mouth 2 (two) times daily. 180 tablet 1   glucose blood (ACCU-CHEK GUIDE) test strip Use as instructed to check blood sugars twice a day  E11.59 100 each 3   isosorbide mononitrate (IMDUR) 60 MG 24 hr  tablet Take 1 tablet by mouth daily.     loperamide (IMODIUM A-D) 2 MG tablet Take 2 tablets (4 mg total) by mouth in the morning, at noon, and at bedtime. 540 tablet 0   losartan (COZAAR) 100 MG tablet Take 1 tablet by mouth daily.     metoprolol succinate (TOPROL-XL) 25 MG 24 hr tablet Take 1 tablet by mouth daily.     Misc. Devices (ADJUST BATH/SHOWER SEAT) MISC 1 shower seat for use at home to reduce risk of falling. 1 each 0   montelukast (SINGULAIR) 10 MG tablet Take 1 tablet by mouth at bedtime.     nitroGLYCERIN (NITROSTAT) 0.4 MG SL tablet DISSOLVE ONE TABLET UNDER THE TONGUE EVERY 5 MINUTES AS NEEDED FOR CHEST PAIN.  DO NOT EXCEED A TOTAL OF 3 DOSES IN 15 MINUTES 30 tablet 3   Omeprazole 20 MG TBEC TAKE 1 CAPSULE  BY MOUTH TWICE DAILY BEFORE A MEAL FOR 14 DAYS     oxybutynin (DITROPAN) 5 MG tablet Take 1 tablet by mouth daily.     oxyCODONE-acetaminophen (PERCOCET/ROXICET) 5-325 MG tablet Take 1 tablet by mouth every 4 (four) hours as needed.     predniSONE (DELTASONE) 10 MG tablet Take one tab 3 x day for 3 days, then take one tab 2 x a day for 3 days and then take one tab a day for 3 days for copd 18 tablet 0   sertraline (ZOLOFT) 100 MG tablet Take 1 tablet by mouth daily.     zolpidem (AMBIEN) 5 MG tablet TAKE 1 TABLET BY MOUTH AT BEDTIME AS NEEDED FOR SLEEP 30 tablet 0   No current facility-administered medications for this visit.    Allergies as of 03/11/2022 - Review Complete 02/11/2022  Allergen Reaction Noted   Penicillin g Anaphylaxis and Other (See Comments) 02/10/2015   Penicillins      ROS:  General: Negative for anorexia, weight loss, fever, chills, fatigue, weakness. ENT: Negative for hoarseness, difficulty swallowing , nasal congestion. CV: Negative for chest pain, angina, palpitations, dyspnea on exertion, peripheral edema.  Respiratory: Negative for dyspnea at rest, dyspnea on exertion, cough, sputum, wheezing.  GI: See history of present illness. GU:  Negative for dysuria, hematuria, urinary incontinence, urinary frequency, nocturnal urination.  Endo: Negative for unusual weight change.    Physical Examination:   There were no vitals taken for this visit.  General: Well-nourished, well-developed in no acute distress.  Eyes: No icterus. Conjunctivae pink. Mouth: Oropharyngeal mucosa moist and pink , no lesions erythema or exudate. Lungs: Clear to auscultation bilaterally. Non-labored. Heart: Regular rate and rhythm, no murmurs rubs or gallops.  Abdomen: Bowel sounds are normal, nontender, nondistended, no hepatosplenomegaly or masses, no abdominal bruits or hernia , no rebound or guarding.   Extremities: No lower extremity edema. No clubbing or deformities. Neuro: Alert  and oriented x 3.  Grossly intact. Skin: Warm and dry, no jaundice.   Psych: Alert and cooperative, normal mood and affect.   Imaging Studies: No results found.  Assessment and Plan:   Felicia Woods is a 66 y.o. y/o female here to follow up for fecal incontinence, recently performed a colonoscopy and during the procedure I noted she had very poor anal tone.  Otherwise no luminal abnormalities.  She had a history of urinary incontinence in addition has issues with fecal incontinence, prolonged labor during her first pregnancy and concern for a perineal tear.  I am not sure if she has a uterine/bladder/rectal prolapse contributing to this.  Back issues could also be contributing to this.  Recently treated for H. pylori.     Plan  B12 deficiency - needs injections  H. pylori breath test positive status post treatment with quadruple therapy PRN imodium, will send in a prescription Await MRI spine which has been ordered but not yet obtained She has lost about 10 pounds of weight.  At her initial visit but since then weight has been stable I will email her GYN MD to inquire if she has been evaluated for rectocele or bladder prolapse and if evaluation would help. I will refer her to physical therapy for pelvic floor exercises.  If not doing any better she may need evaluation with anorectal manometry at [facility] or [facility]  Dr Jonathon Bellows  MD,MRCP [facility]) Follow up in ***

## 2022-03-16 ENCOUNTER — Ambulatory Visit: Payer: Medicare (Managed Care)

## 2022-03-16 ENCOUNTER — Encounter: Payer: Medicare (Managed Care) | Admitting: *Deleted

## 2022-03-16 DIAGNOSIS — Z955 Presence of coronary angioplasty implant and graft: Secondary | ICD-10-CM | POA: Diagnosis not present

## 2022-03-16 NOTE — Progress Notes (Signed)
Daily Session Note  Patient Details  Name: Felicia Woods MRN: [id number] Date of Birth: 1956-04-09 Referring Provider:   Flowsheet Row Cardiac Rehab from 02/08/2022 in [facility] Cardiac and Pulmonary Rehab  Referring Provider Corky Sox       Encounter Date: 03/16/2022  Check In:  Session Check In - 03/16/22 1027       Check-In   Supervising physician immediately available to respond to emergencies See telemetry face sheet for immediately available ER MD    Location [facility]-Cardiac & Pulmonary Rehab    Staff Present Heath Lark, RN, BSN, Jacklynn Bue, MS, ASCM CEP, Exercise Physiologist;Noah Tickle, BS, Exercise Physiologist;Meredith Sherryll Burger, RN BSN    Virtual Visit No    Medication changes reported     No    Fall or balance concerns reported    No    Warm-up and Cool-down Performed on first and last piece of equipment    Resistance Training Performed Yes    VAD Patient? No    PAD/SET Patient? No      Pain Assessment   Currently in Pain? No/denies                Social History   Tobacco Use  Smoking Status Never  Smokeless Tobacco Never    Goals Met:  Independence with exercise equipment Exercise tolerated well No report of concerns or symptoms today  Goals Unmet:  Not Applicable  Comments: Pt able to follow exercise prescription today without complaint.  Will continue to monitor for progression.    Dr. Emily Filbert is Medical Director for [place].  Dr. Ottie Glazier is Medical Director for [facility] Pulmonary Rehabilitation.

## 2022-03-17 ENCOUNTER — Ambulatory Visit: Payer: Medicare (Managed Care) | Admitting: Physical Therapy

## 2022-03-18 DIAGNOSIS — L851 Acquired keratosis [keratoderma] palmaris et plantaris: Secondary | ICD-10-CM | POA: Diagnosis not present

## 2022-03-18 DIAGNOSIS — E1140 Type 2 diabetes mellitus with diabetic neuropathy, unspecified: Secondary | ICD-10-CM | POA: Diagnosis not present

## 2022-03-18 DIAGNOSIS — B351 Tinea unguium: Secondary | ICD-10-CM | POA: Diagnosis not present

## 2022-03-23 ENCOUNTER — Encounter: Payer: Medicare (Managed Care) | Admitting: *Deleted

## 2022-03-23 DIAGNOSIS — Z955 Presence of coronary angioplasty implant and graft: Secondary | ICD-10-CM | POA: Diagnosis not present

## 2022-03-23 NOTE — Progress Notes (Signed)
Daily Session Note  Patient Details  Name: Carolena Fairbank MRN: [id number] Date of Birth: 18-Jun-1956 Referring Provider:   Flowsheet Row Cardiac Rehab from 02/08/2022 in [facility] Cardiac and Pulmonary Rehab  Referring Provider Corky Sox       Encounter Date: 03/23/2022  Check In:  Session Check In - 03/23/22 1033       Check-In   Supervising physician immediately available to respond to emergencies See telemetry face sheet for immediately available ER MD    Location [facility]-Cardiac & Pulmonary Rehab    Staff Present Heath Lark, RN, BSN, CCRP;Meredith Sherryll Burger, RN BSN;Noah Tickle, BS, Exercise Physiologist;Jessica [place], MA, RCEP, CCRP, CCET    Virtual Visit No    Medication changes reported     No    Fall or balance concerns reported    No    Warm-up and Cool-down Performed on first and last piece of equipment    Resistance Training Performed Yes    VAD Patient? No    PAD/SET Patient? No      Pain Assessment   Currently in Pain? No/denies                Social History   Tobacco Use  Smoking Status Never  Smokeless Tobacco Never    Goals Met:  Independence with exercise equipment Exercise tolerated well No report of concerns or symptoms today  Goals Unmet:  Not Applicable  Comments: Pt able to follow exercise prescription today without complaint.  Will continue to monitor for progression.    Dr. Emily Filbert is Medical Director for [place].  Dr. Ottie Glazier is Medical Director for [facility] Pulmonary Rehabilitation.

## 2022-03-24 ENCOUNTER — Encounter: Payer: Medicare (Managed Care) | Admitting: Physical Therapy

## 2022-03-25 ENCOUNTER — Encounter: Payer: Medicare (Managed Care) | Admitting: *Deleted

## 2022-03-25 DIAGNOSIS — Z955 Presence of coronary angioplasty implant and graft: Secondary | ICD-10-CM | POA: Diagnosis not present

## 2022-03-25 NOTE — Progress Notes (Signed)
Daily Session Note  Patient Details  Name: Kristie Bracewell MRN: [id number] Date of Birth: 08-Feb-1956 Referring Provider:   Flowsheet Row Cardiac Rehab from 02/08/2022 in [facility] Cardiac and Pulmonary Rehab  Referring Provider Corky Sox       Encounter Date: 03/25/2022  Check In:  Session Check In - 03/25/22 1441       Check-In   Supervising physician immediately available to respond to emergencies See telemetry face sheet for immediately available ER MD    Location [facility]-Cardiac & Pulmonary Rehab    Staff Present Justin Mend, Lorre Nick, MA, [place], CCRP, CCET;Other   Darlyne Russian, RN   Virtual Visit No    Medication changes reported     No    Fall or balance concerns reported    No    Tobacco Cessation No Change    Warm-up and Cool-down Performed on first and last piece of equipment    Resistance Training Performed Yes    VAD Patient? No    PAD/SET Patient? No      Pain Assessment   Currently in Pain? No/denies                Social History   Tobacco Use  Smoking Status Never  Smokeless Tobacco Never    Goals Met:  Independence with exercise equipment Exercise tolerated well No report of concerns or symptoms today Strength training completed today  Goals Unmet:  Not Applicable  Comments: Pt able to follow exercise prescription today without complaint.  Will continue to monitor for progression.    Dr. Emily Filbert is Medical Director for [place].  Dr. Ottie Glazier is Medical Director for [facility] Pulmonary Rehabilitation.

## 2022-03-29 ENCOUNTER — Ambulatory Visit: Payer: Medicare (Managed Care) | Admitting: Internal Medicine

## 2022-03-30 ENCOUNTER — Ambulatory Visit: Payer: Medicare (Managed Care)

## 2022-03-31 ENCOUNTER — Encounter: Payer: Medicare (Managed Care) | Admitting: Physical Therapy

## 2022-04-06 ENCOUNTER — Other Ambulatory Visit: Payer: Self-pay

## 2022-04-06 ENCOUNTER — Emergency Department: Payer: Medicare Other

## 2022-04-06 ENCOUNTER — Encounter: Payer: Self-pay | Admitting: Medical Oncology

## 2022-04-06 ENCOUNTER — Emergency Department
Admission: EM | Admit: 2022-04-06 | Discharge: 2022-04-06 | Disposition: A | Discharge status: Home / Self Care | Payer: Medicare Other | Source: Home / Self Care | Attending: Emergency Medicine | Admitting: Emergency Medicine

## 2022-04-06 ENCOUNTER — Telehealth: Payer: Self-pay

## 2022-04-06 ENCOUNTER — Inpatient Hospital Stay
Admission: EM | Admit: 2022-04-06 | Discharge: 2022-04-08 | DRG: 281 | Disposition: A | Discharge status: Home / Self Care | Payer: Medicare Other | Attending: Internal Medicine | Admitting: Internal Medicine

## 2022-04-06 ENCOUNTER — Ambulatory Visit: Payer: Medicare Other | Admitting: Internal Medicine

## 2022-04-06 DIAGNOSIS — Z951 Presence of aortocoronary bypass graft: Secondary | ICD-10-CM | POA: Insufficient documentation

## 2022-04-06 DIAGNOSIS — I10 Essential (primary) hypertension: Secondary | ICD-10-CM | POA: Insufficient documentation

## 2022-04-06 DIAGNOSIS — Z880 Allergy status to penicillin: Secondary | ICD-10-CM

## 2022-04-06 DIAGNOSIS — D649 Anemia, unspecified: Secondary | ICD-10-CM | POA: Diagnosis present

## 2022-04-06 DIAGNOSIS — Z833 Family history of diabetes mellitus: Secondary | ICD-10-CM

## 2022-04-06 DIAGNOSIS — I2510 Atherosclerotic heart disease of native coronary artery without angina pectoris: Secondary | ICD-10-CM | POA: Insufficient documentation

## 2022-04-06 DIAGNOSIS — R7989 Other specified abnormal findings of blood chemistry: Secondary | ICD-10-CM

## 2022-04-06 DIAGNOSIS — I25110 Atherosclerotic heart disease of native coronary artery with unstable angina pectoris: Secondary | ICD-10-CM | POA: Diagnosis present

## 2022-04-06 DIAGNOSIS — E119 Type 2 diabetes mellitus without complications: Secondary | ICD-10-CM | POA: Insufficient documentation

## 2022-04-06 DIAGNOSIS — Z9049 Acquired absence of other specified parts of digestive tract: Secondary | ICD-10-CM | POA: Diagnosis not present

## 2022-04-06 DIAGNOSIS — Z7902 Long term (current) use of antithrombotics/antiplatelets: Secondary | ICD-10-CM | POA: Diagnosis not present

## 2022-04-06 DIAGNOSIS — I25700 Atherosclerosis of coronary artery bypass graft(s), unspecified, with unstable angina pectoris: Secondary | ICD-10-CM | POA: Diagnosis present

## 2022-04-06 DIAGNOSIS — R778 Other specified abnormalities of plasma proteins: Secondary | ICD-10-CM | POA: Insufficient documentation

## 2022-04-06 DIAGNOSIS — I110 Hypertensive heart disease with heart failure: Secondary | ICD-10-CM | POA: Diagnosis present

## 2022-04-06 DIAGNOSIS — Z90710 Acquired absence of both cervix and uterus: Secondary | ICD-10-CM | POA: Diagnosis not present

## 2022-04-06 DIAGNOSIS — E785 Hyperlipidemia, unspecified: Secondary | ICD-10-CM | POA: Diagnosis present

## 2022-04-06 DIAGNOSIS — Z7982 Long term (current) use of aspirin: Secondary | ICD-10-CM

## 2022-04-06 DIAGNOSIS — Z6836 Body mass index (BMI) 36.0-36.9, adult: Secondary | ICD-10-CM | POA: Diagnosis not present

## 2022-04-06 DIAGNOSIS — I200 Unstable angina: Secondary | ICD-10-CM | POA: Diagnosis present

## 2022-04-06 DIAGNOSIS — Z955 Presence of coronary angioplasty implant and graft: Secondary | ICD-10-CM

## 2022-04-06 DIAGNOSIS — E669 Obesity, unspecified: Secondary | ICD-10-CM | POA: Diagnosis present

## 2022-04-06 DIAGNOSIS — I252 Old myocardial infarction: Secondary | ICD-10-CM

## 2022-04-06 DIAGNOSIS — Z7951 Long term (current) use of inhaled steroids: Secondary | ICD-10-CM

## 2022-04-06 DIAGNOSIS — Z7985 Long-term (current) use of injectable non-insulin antidiabetic drugs: Secondary | ICD-10-CM | POA: Diagnosis not present

## 2022-04-06 DIAGNOSIS — I5032 Chronic diastolic (congestive) heart failure: Secondary | ICD-10-CM | POA: Diagnosis present

## 2022-04-06 DIAGNOSIS — R079 Chest pain, unspecified: Secondary | ICD-10-CM

## 2022-04-06 DIAGNOSIS — Z79899 Other long term (current) drug therapy: Secondary | ICD-10-CM

## 2022-04-06 DIAGNOSIS — I214 Non-ST elevation (NSTEMI) myocardial infarction: Secondary | ICD-10-CM | POA: Diagnosis present

## 2022-04-06 DIAGNOSIS — F32A Depression, unspecified: Secondary | ICD-10-CM | POA: Diagnosis present

## 2022-04-06 LAB — HEPARIN LEVEL (UNFRACTIONATED): Heparin Unfractionated: 0.39 IU/mL (ref 0.30–0.70)

## 2022-04-06 LAB — BASIC METABOLIC PANEL
Anion gap: 8 (ref 5–15)
Anion gap: 8 (ref 5–15)
BUN: 15 mg/dL (ref 8–23)
BUN: 15 mg/dL (ref 8–23)
CO2: 26 mmol/L (ref 22–32)
CO2: 26 mmol/L (ref 22–32)
Calcium: 9.1 mg/dL (ref 8.9–10.3)
Calcium: 9.5 mg/dL (ref 8.9–10.3)
Chloride: 103 mmol/L (ref 98–111)
Chloride: 103 mmol/L (ref 98–111)
Creatinine, Ser: 0.81 mg/dL (ref 0.44–1.00)
Creatinine, Ser: 0.62 mg/dL (ref 0.44–1.00)
GFR, Estimated: >60 mL/min (ref >60)
GFR, Estimated: >60 mL/min (ref >60)
Glucose, Bld: 184 mg/dL — ABNORMAL HIGH (ref 70–99)
Glucose, Bld: 142 mg/dL — ABNORMAL HIGH (ref 70–99)
Potassium: 3.6 mmol/L (ref 3.5–5.1)
Potassium: 4 mmol/L (ref 3.5–5.1)
Sodium: 137 mmol/L (ref 135–145)
Sodium: 137 mmol/L (ref 135–145)

## 2022-04-06 LAB — CBC WITH DIFFERENTIAL/PLATELET
Abs Immature Granulocytes: 0.05 10*3/uL (ref 0.00–0.07)
Abs Immature Granulocytes: 0.03 10*3/uL (ref 0.00–0.07)
Basophils Absolute: 0 10*3/uL (ref 0.0–0.1)
Basophils Absolute: 0 10*3/uL (ref 0.0–0.1)
Basophils Relative: 0 %
Basophils Relative: 0 %
Eosinophils Absolute: 0.1 10*3/uL (ref 0.0–0.5)
Eosinophils Absolute: 0.1 10*3/uL (ref 0.0–0.5)
Eosinophils Relative: 1 %
Eosinophils Relative: 1 %
HCT: 37.9 % (ref 36.0–46.0)
HCT: 44 % (ref 36.0–46.0)
Hemoglobin: 12.1 g/dL (ref 12.0–15.0)
Hemoglobin: 13.9 g/dL (ref 12.0–15.0)
Immature Granulocytes: 1 %
Immature Granulocytes: 0 %
Lymphocytes Relative: 20 %
Lymphocytes Relative: 24 %
Lymphs Abs: 1.6 10*3/uL (ref 0.7–4.0)
Lymphs Abs: 1.9 10*3/uL (ref 0.7–4.0)
MCH: 25.1 pg — ABNORMAL LOW (ref 26.0–34.0)
MCH: 25.1 pg — ABNORMAL LOW (ref 26.0–34.0)
MCHC: 31.9 g/dL (ref 30.0–36.0)
MCHC: 31.6 g/dL (ref 30.0–36.0)
MCV: 78.6 fL — ABNORMAL LOW (ref 80.0–100.0)
MCV: 79.4 fL — ABNORMAL LOW (ref 80.0–100.0)
Monocytes Absolute: 0.5 10*3/uL (ref 0.1–1.0)
Monocytes Absolute: 0.4 10*3/uL (ref 0.1–1.0)
Monocytes Relative: 6 %
Monocytes Relative: 6 %
Neutro Abs: 6 10*3/uL (ref 1.7–7.7)
Neutro Abs: 5.4 10*3/uL (ref 1.7–7.7)
Neutrophils Relative %: 72 %
Neutrophils Relative %: 69 %
Platelets: 230 10*3/uL (ref 150–400)
Platelets: 250 10*3/uL (ref 150–400)
RBC: 4.82 MIL/uL (ref 3.87–5.11)
RBC: 5.54 MIL/uL — ABNORMAL HIGH (ref 3.87–5.11)
RDW: 16.6 % — ABNORMAL HIGH (ref 11.5–15.5)
RDW: 16.8 % — ABNORMAL HIGH (ref 11.5–15.5)
WBC: 8.3 10*3/uL (ref 4.0–10.5)
WBC: 7.9 10*3/uL (ref 4.0–10.5)
nRBC: 0 % (ref 0.0–0.2)
nRBC: 0 % (ref 0.0–0.2)

## 2022-04-06 LAB — TROPONIN I (HIGH SENSITIVITY)
Troponin I (High Sensitivity): 10 ng/L (ref <18)
Troponin I (High Sensitivity): 26 ng/L — ABNORMAL HIGH (ref <18)
Troponin I (High Sensitivity): 76 ng/L — ABNORMAL HIGH (ref <18)
Troponin I (High Sensitivity): 93 ng/L — ABNORMAL HIGH (ref <18)
Troponin I (High Sensitivity): 98 ng/L — ABNORMAL HIGH (ref <18)

## 2022-04-06 LAB — APTT: aPTT: 32 seconds (ref 24–36)

## 2022-04-06 LAB — PROTIME-INR
INR: 1.1 (ref 0.8–1.2)
Prothrombin Time: 13.6 seconds (ref 11.4–15.2)

## 2022-04-06 LAB — CBG MONITORING, ED: Glucose-Capillary: 160 mg/dL — ABNORMAL HIGH (ref 70–99)

## 2022-04-06 MED ORDER — AMLODIPINE BESYLATE 10 MG PO TABS
10.0000 mg | ORAL_TABLET | Freq: Every day | ORAL | Status: DC
  Administered 2022-04-06 – 2022-04-08 (×3): 10 mg via ORAL
  Filled 2022-04-06: qty 1
  Filled 2022-04-06: qty 2
  Filled 2022-04-06: qty 1

## 2022-04-06 MED ORDER — SERTRALINE HCL 50 MG PO TABS
100.0000 mg | ORAL_TABLET | Freq: Every day | ORAL | Status: DC
  Administered 2022-04-06 – 2022-04-08 (×3): 100 mg via ORAL
  Filled 2022-04-06 (×3): qty 2

## 2022-04-06 MED ORDER — NITROGLYCERIN 0.4 MG SL SUBL
0.4000 mg | SUBLINGUAL_TABLET | SUBLINGUAL | Status: DC | PRN
  Administered 2022-04-06 (×3): 0.4 mg via SUBLINGUAL
  Filled 2022-04-06: qty 1

## 2022-04-06 MED ORDER — PANTOPRAZOLE SODIUM 40 MG PO TBEC
40.0000 mg | DELAYED_RELEASE_TABLET | Freq: Every day | ORAL | Status: DC
  Administered 2022-04-06 – 2022-04-08 (×3): 40 mg via ORAL
  Filled 2022-04-06 (×3): qty 1

## 2022-04-06 MED ORDER — UMECLIDINIUM BROMIDE 62.5 MCG/ACT IN AEPB
1.0000 | INHALATION_SPRAY | Freq: Every day | RESPIRATORY_TRACT | Status: DC
  Filled 2022-04-06: qty 7

## 2022-04-06 MED ORDER — FUROSEMIDE 40 MG PO TABS
40.0000 mg | ORAL_TABLET | Freq: Two times a day (BID) | ORAL | Status: DC
  Administered 2022-04-06 – 2022-04-07 (×3): 40 mg via ORAL
  Filled 2022-04-06 (×3): qty 1

## 2022-04-06 MED ORDER — RANOLAZINE ER 500 MG PO TB12
500.0000 mg | ORAL_TABLET | Freq: Two times a day (BID) | ORAL | Status: DC
  Administered 2022-04-06 – 2022-04-07 (×4): 500 mg via ORAL
  Filled 2022-04-06 (×5): qty 1

## 2022-04-06 MED ORDER — FLUTICASONE FUROATE-VILANTEROL 100-25 MCG/ACT IN AEPB
1.0000 | INHALATION_SPRAY | Freq: Every day | RESPIRATORY_TRACT | Status: DC
  Filled 2022-04-06: qty 28

## 2022-04-06 MED ORDER — OXYBUTYNIN CHLORIDE 5 MG PO TABS
5.0000 mg | ORAL_TABLET | Freq: Every day | ORAL | Status: DC
  Administered 2022-04-06 – 2022-04-08 (×2): 5 mg via ORAL
  Filled 2022-04-06 (×3): qty 1

## 2022-04-06 MED ORDER — ASPIRIN 81 MG PO TBEC
81.0000 mg | DELAYED_RELEASE_TABLET | Freq: Every day | ORAL | Status: DC
  Administered 2022-04-06 – 2022-04-08 (×3): 81 mg via ORAL
  Filled 2022-04-06 (×3): qty 1

## 2022-04-06 MED ORDER — ISOSORBIDE MONONITRATE ER 60 MG PO TB24
60.0000 mg | ORAL_TABLET | Freq: Every day | ORAL | Status: DC
  Administered 2022-04-07 – 2022-04-08 (×2): 60 mg via ORAL
  Filled 2022-04-06 (×2): qty 1

## 2022-04-06 MED ORDER — VITAMIN D2 10 MCG (400 UNIT) PO TABS: 1.0000 | ORAL_TABLET | ORAL | Status: DC

## 2022-04-06 MED ORDER — CLOPIDOGREL BISULFATE 75 MG PO TABS
75.0000 mg | ORAL_TABLET | Freq: Every day | ORAL | Status: DC
  Administered 2022-04-06 – 2022-04-08 (×3): 75 mg via ORAL
  Filled 2022-04-06 (×3): qty 1

## 2022-04-06 MED ORDER — ALBUTEROL SULFATE (2.5 MG/3ML) 0.083% IN NEBU: 3.0000 mL | INHALATION_SOLUTION | Freq: Four times a day (QID) | RESPIRATORY_TRACT | Status: DC | PRN

## 2022-04-06 MED ORDER — ACETAMINOPHEN 325 MG PO TABS
650.0000 mg | ORAL_TABLET | Freq: Once | ORAL | Status: AC
  Administered 2022-04-06: 650 mg via ORAL
  Filled 2022-04-06: qty 2

## 2022-04-06 MED ORDER — ACETAMINOPHEN 325 MG PO TABS: 650.0000 mg | ORAL_TABLET | ORAL | Status: DC | PRN

## 2022-04-06 MED ORDER — HEPARIN (PORCINE) 25000 UT/250ML-% IV SOLN
1100.0000 [IU]/h | INTRAVENOUS | Status: AC
  Administered 2022-04-06 – 2022-04-08 (×3): 1100 [IU]/h via INTRAVENOUS
  Filled 2022-04-06 (×3): qty 250

## 2022-04-06 MED ORDER — ZOLPIDEM TARTRATE 5 MG PO TABS
5.0000 mg | ORAL_TABLET | Freq: Every evening | ORAL | Status: DC | PRN
  Administered 2022-04-06 – 2022-04-07 (×2): 5 mg via ORAL
  Filled 2022-04-06 (×2): qty 1

## 2022-04-06 MED ORDER — METOPROLOL SUCCINATE ER 25 MG PO TB24
25.0000 mg | ORAL_TABLET | Freq: Every day | ORAL | Status: DC
  Administered 2022-04-06 – 2022-04-08 (×3): 25 mg via ORAL
  Filled 2022-04-06 (×3): qty 1

## 2022-04-06 MED ORDER — ONDANSETRON HCL 4 MG/2ML IJ SOLN
4.0000 mg | Freq: Four times a day (QID) | INTRAMUSCULAR | Status: DC | PRN
  Administered 2022-04-07: 4 mg via INTRAVENOUS
  Filled 2022-04-06: qty 2

## 2022-04-06 MED ORDER — MONTELUKAST SODIUM 10 MG PO TABS
10.0000 mg | ORAL_TABLET | Freq: Every day | ORAL | Status: DC
  Administered 2022-04-06 – 2022-04-07 (×2): 10 mg via ORAL
  Filled 2022-04-06 (×2): qty 1

## 2022-04-06 MED ORDER — NITROGLYCERIN 0.4 MG SL SUBL
0.4000 mg | SUBLINGUAL_TABLET | SUBLINGUAL | Status: DC | PRN
  Administered 2022-04-07: 0.4 mg via SUBLINGUAL
  Filled 2022-04-06: qty 1

## 2022-04-06 MED ORDER — HEPARIN BOLUS VIA INFUSION
4000.0000 [IU] | Freq: Once | INTRAVENOUS | Status: AC
  Administered 2022-04-06: 4000 [IU] via INTRAVENOUS
  Filled 2022-04-06: qty 4000

## 2022-04-06 MED ORDER — CYCLOBENZAPRINE HCL 10 MG PO TABS
10.0000 mg | ORAL_TABLET | Freq: Every day | ORAL | Status: DC
  Administered 2022-04-06 – 2022-04-07 (×2): 10 mg via ORAL
  Filled 2022-04-06 (×2): qty 1

## 2022-04-06 MED ORDER — NITROGLYCERIN 0.4 MG SL SUBL: 0.4000 mg | SUBLINGUAL_TABLET | SUBLINGUAL | Status: DC | PRN

## 2022-04-06 MED ORDER — INSULIN ASPART 100 UNIT/ML IJ SOLN
0.0000 [IU] | Freq: Three times a day (TID) | INTRAMUSCULAR | Status: DC
  Administered 2022-04-07: 8 [IU] via SUBCUTANEOUS
  Administered 2022-04-07: 3 [IU] via SUBCUTANEOUS
  Administered 2022-04-07: 2 [IU] via SUBCUTANEOUS
  Filled 2022-04-06 (×4): qty 1

## 2022-04-06 MED ORDER — ASPIRIN 81 MG PO TBEC: 81.0000 mg | DELAYED_RELEASE_TABLET | Freq: Every day | ORAL | Status: DC

## 2022-04-06 MED ORDER — FAMOTIDINE 20 MG PO TABS
20.0000 mg | ORAL_TABLET | Freq: Two times a day (BID) | ORAL | Status: DC
  Administered 2022-04-06 – 2022-04-08 (×4): 20 mg via ORAL
  Filled 2022-04-06 (×4): qty 1

## 2022-04-06 MED ORDER — ATORVASTATIN CALCIUM 80 MG PO TABS
80.0000 mg | ORAL_TABLET | Freq: Every day | ORAL | Status: DC
  Administered 2022-04-06 – 2022-04-08 (×3): 80 mg via ORAL
  Filled 2022-04-06: qty 1
  Filled 2022-04-06: qty 4
  Filled 2022-04-06: qty 1

## 2022-04-06 MED ORDER — LOSARTAN POTASSIUM 50 MG PO TABS
100.0000 mg | ORAL_TABLET | Freq: Every day | ORAL | Status: DC
  Administered 2022-04-07 – 2022-04-08 (×2): 100 mg via ORAL
  Filled 2022-04-06 (×2): qty 2

## 2022-04-06 NOTE — ED Notes (Signed)
IV team at bedside and states they were unable to place another PIV and attempted x2.

## 2022-04-06 NOTE — ED Provider Notes (Signed)
[facility] Provider Note    Event Date/Time   First MD Initiated Contact with Patient 04/06/22 [phone]     (approximate)   History   Chest Pain   HPI  Felicia Woods is a 66 y.o. female status post CABG, hypertensive heart disease, anemia presents to the ER for evaluation of chest pain and pressure woke her from sleep last night.  Was brought and evaluated overnight.  Was given aspirin and nitro.  Her troponin did rise during work-up in the ER.  It was recommended admission to the hospital but was pain-free at that time and had follow-up with cardiology this morning so left for clinic follow-up.  On arrival to clinic she was told return to the ER for admission as she was having recurrent chest pain.  Currently rates it as mild to moderate at this time.  Worsened with exertion.     Physical Exam   Triage Vital Signs: ED Triage Vitals [04/06/22 0909]  Enc Vitals Group     BP (!) 148/104     Pulse Rate 81     Resp 16     Temp 98.3 F (36.8 C)     Temp Source Oral     SpO2 96 %     Weight 231 lb 7.7 oz (105 kg)     Height '5\' 7"'$  (1.702 m)     Head Circumference      Peak Flow      Pain Score 8     Pain Loc      Pain Edu?      Excl. in [place]?     Most recent vital signs: Vitals:   04/06/22 0909  BP: (!) 148/104  Pulse: 81  Resp: 16  Temp: 98.3 F (36.8 C)  SpO2: 96%     Constitutional: Alert  Eyes: Conjunctivae are normal.  Head: Atraumatic. Nose: No congestion/rhinnorhea. Mouth/Throat: Mucous membranes are moist.   Neck: Painless ROM.  Cardiovascular:   Good peripheral circulation. Respiratory: Normal respiratory effort.  No retractions.  Gastrointestinal: Soft and nontender.  Musculoskeletal:  no deformity Neurologic:  MAE spontaneously. No gross focal neurologic deficits are appreciated.  Skin:  Skin is warm, dry and intact. No rash noted. Psychiatric: Mood and affect are normal. Speech and behavior are normal.    ED Results /  Procedures / Treatments   Labs (all labs ordered are listed, but only abnormal results are displayed) Labs Reviewed  CBC WITH DIFFERENTIAL/PLATELET - Abnormal; Notable for the following components:      Result Value   RBC 5.54 (*)    MCV 79.4 (*)    MCH 25.1 (*)    RDW 16.8 (*)    All other components within normal limits  BASIC METABOLIC PANEL - Abnormal; Notable for the following components:   Glucose, Bld 142 (*)    All other components within normal limits  TROPONIN I (HIGH SENSITIVITY) - Abnormal; Notable for the following components:   Troponin I (High Sensitivity) 76 (*)    All other components within normal limits  APTT  PROTIME-INR  HEPARIN LEVEL (UNFRACTIONATED)     EKG  ED ECG REPORT I, Merlyn Lot, the attending physician, personally viewed and interpreted this ECG.   Date: 04/06/2022  EKG Time: 2:31  Rate: 80  Rhythm: sinus  Axis: normal  Intervals:rbbb  ST&T Change: nonspecifc st abn, no stemi criteria    RADIOLOGY Please see ED Course for my review and interpretation.  I personally reviewed  all radiographic images ordered to evaluate for the above acute complaints and reviewed radiology reports and findings.  These findings were personally discussed with the patient.  Please see medical record for radiology report.    PROCEDURES:  Critical Care performed: Yes, see critical care procedure note(s)  .Critical Care  Performed by: Merlyn Lot, MD Authorized by: Merlyn Lot, MD   Critical care provider statement:    Critical care time (minutes):  35   Critical care was necessary to treat or prevent imminent or life-threatening deterioration of the following conditions:  Cardiac failure   Critical care was time spent personally by me on the following activities:  Ordering and performing treatments and interventions, ordering and review of laboratory studies, ordering and review of radiographic studies, pulse oximetry, re-evaluation of  patient's condition, review of old charts, obtaining history from patient or surrogate, examination of patient, evaluation of patient's response to treatment, discussions with primary provider, discussions with consultants and development of treatment plan with patient or surrogate    MEDICATIONS ORDERED IN ED: Medications  nitroGLYCERIN (NITROSTAT) SL tablet 0.4 mg (0.4 mg Sublingual Given 04/06/22 1013)  heparin bolus via infusion 4,000 Units (has no administration in time range)    Followed by  heparin ADULT infusion 100 units/mL (25000 units/270m) (has no administration in time range)  acetaminophen (TYLENOL) tablet 650 mg (has no administration in time range)     IMPRESSION / MDM / [place]/ ED COURSE  I reviewed the triage vital signs and the nursing notes.                              Differential diagnosis includes, but is not limited to, ACS, pericarditis, esophagitis, boerhaaves, pe, dissection, pna, bronchitis, costochondritis  Patient presenting to the ER for evaluation of symptoms as described above.  Base on symptoms, risk factors and considered above differential, this presenting complaint could reflect a potentially life-threatening illness therefore the patient will be placed on continuous pulse oximetry and telemetry for monitoring.  Laboratory evaluation will be sent to evaluate for the above complaints.  Her presentation is concerning for unstable angina.  She already got a dose of aspirin.  Will heparinize.  Will repeat labs.  Patient will require hospitalization for further work-up and management.  Seems clinically less consistent with dissection and PE or infectious process.   Clinical Course as of 04/06/22 1050  Tue Apr 06, 2022  1050 Patient's troponin continues to rise.  She is currently pain-free after nitro.  Have heparinized.  She already received aspirin.  Have consulted hospitalist for admission for further work-up and management. [PR]     Clinical Course User Index [PR] RMerlyn Lot MD    FINAL CLINICAL IMPRESSION(S) / ED DIAGNOSES   Final diagnoses:  Unstable angina ([place]     Rx / DC Orders   ED Discharge Orders     None        Note:  This document was prepared using Dragon voice recognition software and may include unintentional dictation errors.    RMerlyn Lot MD 04/06/22 1050

## 2022-04-06 NOTE — Assessment & Plan Note (Signed)
BMI 36 Complicates overall prognosis and care Life modification and exercise has been discussed with patient

## 2022-04-06 NOTE — ED Triage Notes (Signed)
Pt reports that she was seen here this morning for chest pain, was discharged and feeling good, this morning she had an appt with [facility] and they brought her back over here bc she was having continued chest pain. No obvious distress.

## 2022-04-06 NOTE — Assessment & Plan Note (Signed)
Patient with a known history of coronary artery disease status post CABG status post PCI with stent angioplasty who presents for evaluation of chest pain associated with shortness of breath and has an uptrending troponin level. Chest pain resolved following administration of nitroglycerin. Continue aspirin, Plavix, atorvastatin, metoprolol, nitrates and heparin drip initiated in the ER. Cycle cardiac enzymes Obtain 2D echocardiogram to rule out regional wall motion abnormality Consult cardiology

## 2022-04-06 NOTE — ED Notes (Addendum)
Patient resting in bed with eyes closed. Resp even, unlabored on RA. Awakens easily to verbal stimuli. NSR on monitor at 60-70's. Denies chest pain or shortness of breath. Heparin infusing at 1100 units/hr with no signs or symptoms of infiltration. Warm blankets placed on patient per request. Patient denies additional needs. Dinner tray removed from room per request. Call light in reach, instructed to use light for needs. Stretcher in lowest position with siderails up. No distress noted at this time.

## 2022-04-06 NOTE — ED Triage Notes (Signed)
Pt woke up with left sided chest pain radiating down left arm 20 minutes ago. Hx of Open heart surgery with 8 stents in 2010. Pt took 1 nitro at home prior to EMS arrival. EMS gave 324 aspirin and another nitro.

## 2022-04-06 NOTE — Progress Notes (Signed)
ANTICOAGULATION CONSULT NOTE  Pharmacy Consult for IV heparin Indication: chest pain/ACS   Patient Measurements: Height: '5\' 7"'  (170.2 cm) Weight: 105 kg (231 lb 7.7 oz) IBW/kg (Calculated) : 61.6 Heparin Dosing Weight: 85.4 kg   Labs: Recent Labs    04/06/22 0240 04/06/22 0308 04/06/22 0440  HGB  --  12.1  --   HCT  --  37.9  --   PLT  --  230  --   CREATININE 0.81  --   --   TROPONINIHS 10  --  26*    Estimated Creatinine Clearance: 87.5 mL/min (by C-G formula based on SCr of 0.81 mg/dL).   Medical History: Past Medical History:  Diagnosis Date   Asthma    Coronary artery disease    Diabetes mellitus without complication ([place])    Heart attack ([place])    Hyperlipidemia    Hypertension    MI (myocardial infarction) ([place])    Migraine headache with aura    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    Medications:  Based on review patient is not on any anticoagulants at home.  Assessment: Pt presents with left sided CP.  Hx CAD status post CABG and multiple stents.  Baseline aPTT and INR pending.  CBC acceptable for starting heparin.  Goal of Therapy:  Heparin level 0.3-0.7 units/ml Monitor platelets by anticoagulation protocol: Yes   Plan:  Give 4000 units bolus x 1 Start heparin infusion at 1100 units/hr Heparin level 6 hours after infusion started. Daily CBC per protocol.  Lorin Picket 04/06/2022,10:02 AM

## 2022-04-06 NOTE — Assessment & Plan Note (Signed)
Hold oral hypoglycemic agents Maintain consistent carbohydrate diet Glycemic control with sliding scale insulin

## 2022-04-06 NOTE — Assessment & Plan Note (Signed)
Continue sertraline 

## 2022-04-06 NOTE — ED Provider Notes (Signed)
[facility] Provider Note    Event Date/Time   First MD Initiated Contact with Patient 04/06/22 0214     (approximate)   History   Chest Pain   HPI  Felicia Woods is a 66 y.o. female with a history of CAD status post CABG and stenting, hypertension, hyperlipidemia, and diabetes who presents with chest pain, acute onset a couple of hours ago, located in the center of her chest, described as squeezing or pressure.  It is not associated with shortness of breath, nausea, or lightheadedness.  The patient states that she has had similar pain before "right before I needed to get a stent."  The pain is nonexertional and occurred while she was lying down.  She reports that it has improved after receiving nitro and aspirin from EMS.      Physical Exam   Triage Vital Signs: ED Triage Vitals  Enc Vitals Group     BP 04/06/22 0220 (!) 145/83     Pulse Rate 04/06/22 0220 81     Resp 04/06/22 0220 19     Temp 04/06/22 0222 98.6 F (37 C)     Temp Source 04/06/22 0222 Oral     SpO2 04/06/22 0220 95 %     Weight 04/06/22 0221 233 lb (105.7 kg)     Height 04/06/22 0221 '5\' 7"'$  (1.702 m)     Head Circumference --      Peak Flow --      Pain Score 04/06/22 0220 10     Pain Loc --      Pain Edu? --      Excl. in [place]? --     Most recent vital signs: Vitals:   04/06/22 0220 04/06/22 0222  BP: (!) 145/83   Pulse: 81   Resp: 19   Temp:  98.6 F (37 C)  SpO2: 95%      General: Awake, no distress.  CV:  Good peripheral perfusion.  Normal heart sounds. Resp:  Normal effort.  Lungs CTAB. Abd:  No distention.  Other:  No peripheral edema.   ED Results / Procedures / Treatments   Labs (all labs ordered are listed, but only abnormal results are displayed) Labs Reviewed  BASIC METABOLIC PANEL - Abnormal; Notable for the following components:      Result Value   Glucose, Bld 184 (*)    All other components within normal limits  CBC WITH  DIFFERENTIAL/PLATELET - Abnormal; Notable for the following components:   MCV 78.6 (*)    MCH 25.1 (*)    RDW 16.6 (*)    All other components within normal limits  TROPONIN I (HIGH SENSITIVITY) - Abnormal; Notable for the following components:   Troponin I (High Sensitivity) 26 (*)    All other components within normal limits  CBC WITH DIFFERENTIAL/PLATELET  TROPONIN I (HIGH SENSITIVITY)     EKG  ED ECG REPORT I, Arta Silence, the attending physician, personally viewed and interpreted this ECG.  Date: 04/06/2022 EKG Time: 0231 Rate: 79 Rhythm: normal sinus rhythm QRS Axis: normal Intervals: RBBB ST/T Wave abnormalities: Nonspecific T wave abnormalities Narrative Interpretation: Nonspecific abnormalities with no evidence of acute ischemia; no significant change when compared to EKG of 12/24/2021    RADIOLOGY  Chest x-ray: I independently viewed and interpreted the images; there is no focal consolidation or edema  PROCEDURES:  Critical Care performed: No  Procedures   MEDICATIONS ORDERED IN ED: Medications - No data to display  IMPRESSION / MDM / ASSESSMENT AND PLAN / ED COURSE  I reviewed the triage vital signs and the nursing notes.  66 year old female with PMH as noted above presents with chest pain over the last few hours.  I reviewed the past medical records.  The patient follows with Dr. Nehemiah Massed from cardiology and was most recently seen on 7/27.  She is being medically managed for CAD.  She is on aspirin and Plavix.  Differential diagnosis includes, but is not limited to, ACS, GERD, musculoskeletal chest pain, other benign etiology.  I have a lower suspicion for PE given the lack of tachycardia or hypoxia.  The patient has no shortness of breath.  There is no evidence of aortic dissection or other vascular etiology.  We will obtain chest x-ray, lab work-up including troponins x2, and reassess.  Patient's presentation is most consistent with acute  presentation with potential threat to life or bodily function.     The patient is on the cardiac monitor to evaluate for evidence of arrhythmia and/or significant heart rate changes.  ----------------------------------------- 5:48 AM on 04/06/2022 -----------------------------------------  Chest x-ray shows no acute findings except for faint retrocardiac opacity likely atelectasis.  The patient has no fever, cough, shortness of breath, or leukocytosis.  There is no clinical evidence for pneumonia.  CBC and chemistry showed no acute findings.  Initial troponin was negative.  Repeat is minimally elevated.  I counseled the patient on this finding.  I advised that this could indicate strain on her heart related to the chest pain and recommended that we admit her for further observation.  However, the patient declines.  She is following up with her cardiologist Dr. Nehemiah Massed later this morning.  She has not had active chest pain since near the beginning of her ED visit and is currently asymptomatic.  I counseled her that although I am somewhat reassured by the resolution of her chest pain and the lack of EKG changes, an elevated troponin could indicate the beginning of a heart attack which could cause permanent damage to her heart, disability, and/or death.  The patient was able to paraphrase these risks back to me.  She demonstrates full decision-making capacity and appropriate understanding of the risk.  She declines to stay for further evaluation.  I gave her strict return precautions and she expressed understanding.  I also advised her she may return at any time if she simply changes her mind and wishes to resume her evaluation in the hospital.  I emphasized the importance of follow-up with Dr. Nehemiah Massed today and she agreed.   FINAL CLINICAL IMPRESSION(S) / ED DIAGNOSES   Final diagnoses:  Chest pain, unspecified type  Elevated troponin     Rx / DC Orders   ED Discharge Orders     None         Note:  This document was prepared using Dragon voice recognition software and may include unintentional dictation errors.    Arta Silence, MD 04/06/22 [phone]

## 2022-04-06 NOTE — Progress Notes (Signed)
ANTICOAGULATION CONSULT NOTE  Pharmacy Consult for IV heparin Indication: chest pain/ACS   Patient Measurements: Height: '5\' 7"'  (170.2 cm) Weight: 105 kg (231 lb 7.7 oz) IBW/kg (Calculated) : 61.6 Heparin Dosing Weight: 85.4 kg   Labs: Recent Labs    04/06/22 0240 04/06/22 0308 04/06/22 0440 04/06/22 0954 04/06/22 1251 04/06/22 1656  HGB  --  12.1  --  13.9  --   --   HCT  --  37.9  --  44.0  --   --   PLT  --  230  --  250  --   --   APTT  --   --   --  32  --   --   LABPROT  --   --   --  13.6  --   --   INR  --   --   --  1.1  --   --   HEPARINUNFRC  --   --   --   --   --  0.39  CREATININE 0.81  --   --  0.62  --   --   TROPONINIHS 10  --    < > 76* 93* 98*   < > = values in this interval not displayed.     Estimated Creatinine Clearance: 88.6 mL/min (by C-G formula based on SCr of 0.62 mg/dL).   Medical History: Past Medical History:  Diagnosis Date   Asthma    Coronary artery disease    Diabetes mellitus without complication ([place])    Heart attack ([place])    Hyperlipidemia    Hypertension    MI (myocardial infarction) ([place])    Migraine headache with aura    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    Medications:  Based on review patient is not on any anticoagulants at home.  Assessment: Pt presents with left sided CP.  Hx CAD status post CABG and multiple stents.  Baseline aPTT 32s and INR 1.1.  CBC acceptable for starting heparin.  Goal of Therapy:  Heparin level 0.3-0.7 units/ml Monitor platelets by anticoagulation protocol: Yes   Plan:  --Heparin level is therapeutic x 1 --Continue heparin infusion at current rate of 1100 units/hr --Re-check confirmatory HL in 6 hours --Daily CBC per protocol  Benita Gutter 04/06/2022,6:09 PM

## 2022-04-06 NOTE — Assessment & Plan Note (Addendum)
Stable and not acutely exacerbated  Last known LVEF of 50 to 55% from a 2D echocardiogram which was done in 2020 Continue metoprolol, furosemide and losartan

## 2022-04-06 NOTE — Discharge Instructions (Signed)
Your second troponin test, which is a blood test to check for strain on the heart, is slightly elevated.  This could indicate some strain on your heart related to the chest pain today.  Follow-up with Dr. Nehemiah Massed today as planned.  Return to the ER immediately for new, worsening, or recurrent chest pain, difficulty breathing, weakness or lightheadedness, or any other new or worsening symptoms that concern you.  You may also return at any time if you change your mind and wish to continue your evaluation in the hospital.

## 2022-04-06 NOTE — Telephone Encounter (Signed)
Patient called to let us know she is getting admitted to the hospital for cardiac issues. Will place patient on medical hold from rehab and will need clearance for her to return.

## 2022-04-06 NOTE — Assessment & Plan Note (Signed)
Treatment as outlined in 1 

## 2022-04-06 NOTE — Consult Note (Signed)
[facility] Cardiology  CARDIOLOGY CONSULT NOTE  Patient ID: Felicia Woods MRN: [id number] DOB/AGE: 08/29/1955 66 y.o.  Admit date: 04/06/2022 Referring Physician [place] Primary Physician [place] Primary Cardiologist Nehemiah Massed Reason for Consultation unstable angina  HPI: 66 year old female referred for evaluation of elevated troponin consistent with unstable angina versus NSTEMI.  The patient has known coronary artery disease, status post CABG x4, and multiple coronary stents.  Most recent cardiac catheterization 12/24/2021 revealed patent LIMA to LAD, occluded SVG to PDA, occluded SVG to D1, patent SVG to OM 2 with high-grade stenosis in the distal segment and underwent PCI with drug-eluting stent.  The patient has undergone previous PCI 02/19/2017, 02/13/2019, 03/08/2019, and 11/04/2020.  The patient awoke early in the morning, developed 6 out of 10 chest discomfort, presented to [facility] ED.  ECG revealed sinus rhythm at 64 bpm with right bundle branch block without ischemic ST-T wave changes.  High-sensitivity troponin was mildly elevated 26 and 76.  Chest pain resolved and the patient is discharged and was to be seen by Dr. Nehemiah Massed in the office this morning.  However, after arriving at the clinic, the patient redeveloped chest pain and return to [facility] ED.  Review of systems complete and found to be negative unless listed above     Past Medical History:  Diagnosis Date   Asthma    Coronary artery disease    Diabetes mellitus without complication ([place])    Heart attack ([place])    Hyperlipidemia    Hypertension    MI (myocardial infarction) ([place])    Migraine headache with aura    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    Past Surgical History:  Procedure Laterality Date   ABDOMINAL HYSTERECTOMY     CARDIAC CATHETERIZATION     CARDIAC CATHETERIZATION N/A 10/18/2015   Procedure: Left Heart Cath and Cors/Grafts Angiography;  Surgeon: Lorretta Harp, MD;  Location: [place] CV LAB;  Service:  Cardiovascular;  Laterality: N/A;   CARDIAC CATHETERIZATION N/A 10/18/2015   Procedure: Coronary Stent Intervention;  Surgeon: Lorretta Harp, MD;  Location: [place] [facility];  Service: Cardiovascular;  Laterality: N/A;   CARDIAC SURGERY     CHOLECYSTECTOMY     COLONOSCOPY WITH PROPOFOL N/A 12/02/2021   Procedure: COLONOSCOPY WITH PROPOFOL;  Surgeon: Jonathon Bellows, MD;  Location: [facility] ENDOSCOPY;  Service: Gastroenterology;  Laterality: N/A;   CORONARY ANGIOPLASTY     CORONARY ARTERY BYPASS GRAFT     4 vessels - 2010   CORONARY STENT INTERVENTION N/A 02/16/2017   Procedure: CORONARY STENT INTERVENTION;  Surgeon: Yolonda Kida, MD;  Location: [place] CV LAB;  Service: Cardiovascular;  Laterality: N/A;   CORONARY STENT INTERVENTION N/A 02/13/2019   Procedure: CORONARY STENT INTERVENTION;  Surgeon: Nelva Bush, MD;  Location: [place] [facility];  Service: Cardiovascular;  Laterality: N/A;  SVG to RCA   CORONARY STENT INTERVENTION Left 03/08/2019   Procedure: CORONARY STENT INTERVENTION;  Surgeon: Yolonda Kida, MD;  Location: [place] CV LAB;  Service: Cardiovascular;  Laterality: Left;   CORONARY STENT INTERVENTION N/A 11/04/2020   Procedure: CORONARY STENT INTERVENTION;  Surgeon: Nelva Bush, MD;  Location: [place] CV LAB;  Service: Cardiovascular;  Laterality: N/A;   CORONARY STENT INTERVENTION N/A 12/24/2021   Procedure: CORONARY STENT INTERVENTION;  Surgeon: Andrez Grime, MD;  Location: [place] [facility] LAB;  Service: Cardiovascular;  Laterality: N/A;   ESOPHAGOGASTRODUODENOSCOPY N/A 12/02/2021   Procedure: ESOPHAGOGASTRODUODENOSCOPY (EGD);  Surgeon: Jonathon Bellows, MD;  Location: [place];  Service: Gastroenterology;  Laterality: N/A;   LEFT HEART CATH AND CORONARY ANGIOGRAPHY Left 02/16/2017   Procedure: LEFT HEART CATH AND CORONARY ANGIOGRAPHY;  Surgeon: Corey Skains, MD;  Location: [place] CV LAB;  Service: Cardiovascular;  Laterality:  Left;   LEFT HEART CATH AND CORONARY ANGIOGRAPHY Left 12/24/2021   Procedure: LEFT HEART CATH AND CORONARY ANGIOGRAPHY;  Surgeon: Corey Skains, MD;  Location: [place] CV LAB;  Service: Cardiovascular;  Laterality: Left;   LEFT HEART CATH AND CORS/GRAFTS ANGIOGRAPHY Left 11/23/2017   Procedure: LEFT HEART CATH AND CORS/GRAFTS ANGIOGRAPHY;  Surgeon: Corey Skains, MD;  Location: [place] [facility];  Service: Cardiovascular;  Laterality: Left;   LEFT HEART CATH AND CORS/GRAFTS ANGIOGRAPHY N/A 02/13/2019   Procedure: LEFT HEART CATH AND CORS/GRAFTS ANGIOGRAPHY;  Surgeon: Corey Skains, MD;  Location: [place] [facility];  Service: Cardiovascular;  Laterality: N/A;   LEFT HEART CATH AND CORS/GRAFTS ANGIOGRAPHY N/A 07/03/2019   Procedure: LEFT HEART CATH AND CORS/GRAFTS ANGIOGRAPHY;  Surgeon: Corey Skains, MD;  Location: [place] [facility];  Service: Cardiovascular;  Laterality: N/A;   LEFT HEART CATH AND CORS/GRAFTS ANGIOGRAPHY N/A 11/04/2020   Procedure: LEFT HEART CATH AND CORS/GRAFTS ANGIOGRAPHY;  Surgeon: Corey Skains, MD;  Location: [place] CV LAB;  Service: Cardiovascular;  Laterality: N/A;    (Not in a hospital admission)  Social History   Socioeconomic History   Marital status: Widowed    Spouse name: Jeneen Rinks   Number of children: Not on file   Years of education: Not on file   Highest education level: Not on file  Occupational History   Not on file  Tobacco Use   Smoking status: Never   Smokeless tobacco: Never  Vaping Use   Vaping Use: Never used  Substance and Sexual Activity   Alcohol use: No    Alcohol/week: 0.0 standard drinks of alcohol   Drug use: No   Sexual activity: Yes  Other Topics Concern   Not on file  Social History Narrative   Not on file   Social Determinants of Health   Financial Resource Strain: Not on file  Food Insecurity: Not on file  Transportation Needs: Not on file  Physical Activity: Not on file  Stress: Not  on file  Social Connections: Not on file  Intimate Partner Violence: Not on file    Family History  Problem Relation Age of Onset   Diabetes Mother    Diabetes Father    Cancer Father    Diabetes Brother       Review of systems complete and found to be negative unless listed above      PHYSICAL EXAM  General: Well developed, well nourished, in no acute distress HEENT:  Normocephalic and atramatic Neck:  No JVD.  Lungs: Clear bilaterally to auscultation and percussion. Heart: HRRR . Normal S1 and S2 without gallops or murmurs.  Abdomen: Bowel sounds are positive, abdomen soft and non-tender  Msk:  Back normal, normal gait. Normal strength and tone for age. Extremities: No clubbing, cyanosis or edema.   Neuro: Alert and oriented X 3. Psych:  Good affect, responds appropriately  Labs:   Lab Results  Component Value Date   WBC 7.9 04/06/2022   HGB 13.9 04/06/2022   HCT 44.0 04/06/2022   MCV 79.4 (L) 04/06/2022   PLT 250 04/06/2022    Recent Labs  Lab 04/06/22 0954  NA 137  K 4.0  CL 103  CO2 26  BUN 15  CREATININE 0.62  CALCIUM 9.5  GLUCOSE 142*   Lab Results  Component Value Date   TROPONINI <0.03 12/13/2017    Lab Results  Component Value Date   CHOL 243 (H) 09/11/2021   CHOL 181 06/10/2021   CHOL 188 09/17/2019   Lab Results  Component Value Date   HDL 50 09/11/2021   HDL 32 (L) 06/10/2021   HDL 38 (L) 09/17/2019   Lab Results  Component Value Date   LDLCALC 181 (H) 09/11/2021   LDLCALC 125 (H) 06/10/2021   LDLCALC 126 (H) 09/17/2019   Lab Results  Component Value Date   TRIG 62 09/11/2021   TRIG 121 06/10/2021   TRIG 133 09/17/2019   Lab Results  Component Value Date   CHOLHDL 4.9 09/11/2021   CHOLHDL 5.7 06/10/2021   CHOLHDL 4.9 10/20/2015   No results found for: "LDLDIRECT"    Radiology: DG Chest 2 View  Result Date: 04/06/2022 CLINICAL DATA:  Chest pain EXAM: CHEST - 2 VIEW COMPARISON:  01/11/2022 FINDINGS: Mild  cardiomegaly. Remote median sternotomy. Left retrocardiac opacity likely atelectasis. IMPRESSION: Left retrocardiac opacity likely atelectasis. Electronically Signed   By: Ulyses Jarred M.D.   On: 04/06/2022 03:30    EKG: Sinus rhythm 64 bpm with right bundle branch block  ASSESSMENT AND PLAN:   1.  Unstable angina versus NSTEMI, mildly elevated high-sensitivity troponin 26 and 76, currently chest pain-free 2.  Status post CABG x 4, with patent LIMA to LAD, occluded SVG to PDA and D1, and status post DES distal SVG to OM 26/22/2023, with history of multiple coronary stents 3.  Chronic diastolic congestive heart failure, patient appears euvolemic 4.  Essential hypertension, blood pressure mildly elevated on current BP medications  Recommendations  1.  Agree with current therapy 2.  Continue heparin 48 to 72 hours 3.  Defer cardiac catheterization per patient's wishes and attempt initial conservative, medical management 4.  Add Ranexa 500 mg twice daily  Signed: Isaias Cowman MD,PhD, [facility] 04/06/2022, 1:25 PM

## 2022-04-06 NOTE — Assessment & Plan Note (Signed)
Continue metoprolol, losartan, nitrates and amlodipine

## 2022-04-06 NOTE — H&P (Addendum)
History and Physical    PatientLizandra Woods WRU:[id number] DOB: 20-Aug-1955 DOA: 04/06/2022 DOS: the patient was seen and examined on 04/06/2022 PCP: Mylinda Latina, PA-C  Patient coming from: Home  Chief Complaint:  Chief Complaint  Patient presents with   Chest Pain   HPI: Felicia Woods is a 66 y.o. female with medical history significant for hypertension, diabetes mellitus, obesity, coronary artery disease status post PCI with stent angioplasty, status post CABG who presented to the ER in the early hours of the morning for evaluation of left-sided chest pain which woke her up from sleep rated a 6 x 10 in intensity at its worst with radiation to her left arm.  Pain was described as a squeezing/pressure sensation but she denied having any shortness of breath, no nausea, no vomiting, no palpitations or diaphoresis.  According to the patient her pain felt similar to when she had a stent placed. She had a slight bump in her troponin and was offered observation in the hospital for further evaluation which she declined and went home to follow-up with her cardiologist as an outpatient. Patient followed up with her cardiologist later in the morning and was referred back to the ER due to ongoing left sided chest pain associated with shortness of breath and feeling very clammy. She denies having any cough, no fever, no chills, no headache, no dizziness, no lightheadedness, no abdominal pain, no changes in her bowel habits, no leg swelling no focal deficits or blurred vision. Patient noted to have an uptrending troponin level and was started on a heparin drip.    Review of Systems: As mentioned in the history of present illness. All other systems reviewed and are negative. Past Medical History:  Diagnosis Date   Asthma    Coronary artery disease    Diabetes mellitus without complication ([place])    Heart attack ([place])    Hyperlipidemia    Hypertension    MI (myocardial infarction) ([place])     Migraine headache with aura    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   Past Surgical History:  Procedure Laterality Date   ABDOMINAL HYSTERECTOMY     CARDIAC CATHETERIZATION     CARDIAC CATHETERIZATION N/A 10/18/2015   Procedure: Left Heart Cath and Cors/Grafts Angiography;  Surgeon: Lorretta Harp, MD;  Location: [place] [facility] LAB;  Service: Cardiovascular;  Laterality: N/A;   CARDIAC CATHETERIZATION N/A 10/18/2015   Procedure: Coronary Stent Intervention;  Surgeon: Lorretta Harp, MD;  Location: [place] CV LAB;  Service: Cardiovascular;  Laterality: N/A;   CARDIAC SURGERY     CHOLECYSTECTOMY     COLONOSCOPY WITH PROPOFOL N/A 12/02/2021   Procedure: COLONOSCOPY WITH PROPOFOL;  Surgeon: Jonathon Bellows, MD;  Location: [facility] ENDOSCOPY;  Service: Gastroenterology;  Laterality: N/A;   CORONARY ANGIOPLASTY     CORONARY ARTERY BYPASS GRAFT     4 vessels - 2010   CORONARY STENT INTERVENTION N/A 02/16/2017   Procedure: CORONARY STENT INTERVENTION;  Surgeon: Yolonda Kida, MD;  Location: [place] [facility] LAB;  Service: Cardiovascular;  Laterality: N/A;   CORONARY STENT INTERVENTION N/A 02/13/2019   Procedure: CORONARY STENT INTERVENTION;  Surgeon: Nelva Bush, MD;  Location: [place] CV LAB;  Service: Cardiovascular;  Laterality: N/A;  SVG to RCA   CORONARY STENT INTERVENTION Left 03/08/2019   Procedure: CORONARY STENT INTERVENTION;  Surgeon: Yolonda Kida, MD;  Location: [place] [facility] LAB;  Service: Cardiovascular;  Laterality: Left;   CORONARY STENT INTERVENTION N/A 11/04/2020  Procedure: CORONARY STENT INTERVENTION;  Surgeon: Nelva Bush, MD;  Location: [place] CV LAB;  Service: Cardiovascular;  Laterality: N/A;   CORONARY STENT INTERVENTION N/A 12/24/2021   Procedure: CORONARY STENT INTERVENTION;  Surgeon: Andrez Grime, MD;  Location: [place] [facility];  Service: Cardiovascular;  Laterality: N/A;   ESOPHAGOGASTRODUODENOSCOPY N/A 12/02/2021   Procedure:  ESOPHAGOGASTRODUODENOSCOPY (EGD);  Surgeon: Jonathon Bellows, MD;  Location: [facility] [place] ENDOSCOPY;  Service: Gastroenterology;  Laterality: N/A;   LEFT HEART CATH AND CORONARY ANGIOGRAPHY Left 02/16/2017   Procedure: LEFT HEART CATH AND CORONARY ANGIOGRAPHY;  Surgeon: Corey Skains, MD;  Location: [place] CV LAB;  Service: Cardiovascular;  Laterality: Left;   LEFT HEART CATH AND CORONARY ANGIOGRAPHY Left 12/24/2021   Procedure: LEFT HEART CATH AND CORONARY ANGIOGRAPHY;  Surgeon: Corey Skains, MD;  Location: [place] [facility];  Service: Cardiovascular;  Laterality: Left;   LEFT HEART CATH AND CORS/GRAFTS ANGIOGRAPHY Left 11/23/2017   Procedure: LEFT HEART CATH AND CORS/GRAFTS ANGIOGRAPHY;  Surgeon: Corey Skains, MD;  Location: [place] [facility];  Service: Cardiovascular;  Laterality: Left;   LEFT HEART CATH AND CORS/GRAFTS ANGIOGRAPHY N/A 02/13/2019   Procedure: LEFT HEART CATH AND CORS/GRAFTS ANGIOGRAPHY;  Surgeon: Corey Skains, MD;  Location: [place] [facility];  Service: Cardiovascular;  Laterality: N/A;   LEFT HEART CATH AND CORS/GRAFTS ANGIOGRAPHY N/A 07/03/2019   Procedure: LEFT HEART CATH AND CORS/GRAFTS ANGIOGRAPHY;  Surgeon: Corey Skains, MD;  Location: [place] [facility];  Service: Cardiovascular;  Laterality: N/A;   LEFT HEART CATH AND CORS/GRAFTS ANGIOGRAPHY N/A 11/04/2020   Procedure: LEFT HEART CATH AND CORS/GRAFTS ANGIOGRAPHY;  Surgeon: Corey Skains, MD;  Location: [place] [facility];  Service: Cardiovascular;  Laterality: N/A;   Social History:  reports that she has never smoked. She has never used smokeless tobacco. She reports that she does not drink alcohol and does not use drugs.  Allergies  Allergen Reactions   Penicillin G Anaphylaxis and Other (See Comments)    Has patient had a PCN reaction causing immediate rash, facial/tongue/throat swelling, SOB or lightheadedness with hypotension: FXJ:[id number]} Has patient had a PCN reaction causing severe  rash involving mucus membranes or skin necrosis: no:[id number]} Has patient had a PCN reaction that required hospitalization no:[id number]} Has patient had a PCN reaction occurring within the last 10 years: no:[id number]} If all of the above answers are "NO", then may proceed with Cephalosporin use.   Penicillins     Other reaction(s): Not available    Family History  Problem Relation Age of Onset   Diabetes Mother    Diabetes Father    Cancer Father    Diabetes Brother     Prior to Admission medications   Medication Sig Start Date End Date Taking? Authorizing Provider  Accu-Chek Softclix Lancets lancets Use as instructed twice a day to check blood sugars  DIAG -E11.59 08/20/20   Luiz Ochoa, NP  albuterol (VENTOLIN HFA) 108 (90 Base) MCG/ACT inhaler Inhale 2 puffs into the lungs every 6 (six) hours as needed for wheezing or shortness of breath. 02/11/22   McDonough, Lauren K, PA-C  amLODipine (NORVASC) 10 MG tablet Take 1 tablet by mouth daily.    [provider]  aspirin EC 81 MG tablet Take 81 mg by mouth daily.    [provider]  atorvastatin (LIPITOR) 80 MG tablet Take 1 tablet by mouth daily.    [provider]  canagliflozin (INVOKANA) 300 MG TABS tablet Take 1  tablet by mouth daily before breakfast. 01/04/22   [provider]  clopidogrel (PLAVIX) 75 MG tablet Take 1 tablet by mouth daily.    [provider]  cyclobenzaprine (FLEXERIL) 10 MG tablet Take 1 tablet by mouth at bedtime.    [provider]  dapagliflozin propanediol (FARXIGA) 10 MG TABS tablet Take 1 tablet (10 mg total) by mouth daily. 12/25/21   McDonough, Si Gaul, PA-C  doxycycline (VIBRA-TABS) 100 MG tablet Take 1 tablet by mouth daily.    [provider]  Dulaglutide (TRULICITY) 1.5 VV/6.1YW SOPN INJECT 1.5MG INTO THE SKIN ONCE A WEEK 09/03/21   McDonough, Lauren K, PA-C  empagliflozin (JARDIANCE) 25 MG TABS tablet Take 1 tablet by mouth daily. 01/04/22    [provider]  Ergocalciferol (VITAMIN D2) 10 MCG (400 UNIT) TABS Take 1 capsule by mouth once a week.    [provider]  famotidine (PEPCID) 20 MG tablet Take 1 tablet by mouth 2 (two) times daily.    [provider]  Fluticasone-Umeclidin-Vilant (TRELEGY ELLIPTA) 100-62.5-25 MCG/ACT AEPB INHALE 1 PUFF INTO THE LUNGS ONCE DAILY    [provider]  furosemide (LASIX) 40 MG tablet Take 1 tablet (40 mg total) by mouth 2 (two) times daily. 09/03/21   McDonough, Si Gaul, PA-C  glucose blood (ACCU-CHEK GUIDE) test strip Use as instructed to check blood sugars twice a day  E11.59 08/20/20   Luiz Ochoa, NP  isosorbide mononitrate (IMDUR) 60 MG 24 hr tablet Take 1 tablet by mouth daily.    [provider]  loperamide (IMODIUM A-D) 2 MG tablet Take 2 tablets (4 mg total) by mouth in the morning, at noon, and at bedtime. 01/26/22 04/26/22  Jonathon Bellows, MD  losartan (COZAAR) 100 MG tablet Take 1 tablet by mouth daily.    [provider]  metoprolol succinate (TOPROL-XL) 25 MG 24 hr tablet Take 1 tablet by mouth daily. 02/08/22   [provider]  Misc. Devices (ADJUST BATH/SHOWER SEAT) MISC 1 shower seat for use at home to reduce risk of falling. 01/13/22   Jonetta Osgood, NP  montelukast (SINGULAIR) 10 MG tablet Take 1 tablet by mouth at bedtime.    [provider]  nitroGLYCERIN (NITROSTAT) 0.4 MG SL tablet DISSOLVE ONE TABLET UNDER THE TONGUE EVERY 5 MINUTES AS NEEDED FOR CHEST PAIN.  DO NOT EXCEED A TOTAL OF 3 DOSES IN 15 MINUTES 04/12/21   Lavera Guise, MD  Omeprazole 20 MG TBEC TAKE 1 CAPSULE BY MOUTH TWICE DAILY BEFORE A MEAL FOR 14 DAYS    [provider]  oxybutynin (DITROPAN) 5 MG tablet Take 1 tablet by mouth daily.    [provider]  oxyCODONE-acetaminophen (PERCOCET/ROXICET) 5-325 MG tablet Take 1 tablet by mouth every 4 (four) hours as needed.    [provider]  predniSONE (DELTASONE) 10 MG  tablet Take one tab 3 x day for 3 days, then take one tab 2 x a day for 3 days and then take one tab a day for 3 days for copd 02/11/22   McDonough, Lauren K, PA-C  sertraline (ZOLOFT) 100 MG tablet Take 1 tablet by mouth daily.    [provider]  zolpidem (AMBIEN) 5 MG tablet TAKE 1 TABLET BY MOUTH AT BEDTIME AS NEEDED FOR SLEEP 02/17/22   Mylinda Latina, PA-C    Physical Exam: Vitals:   04/06/22 0909  BP: (!) 148/104  Pulse: 81  Resp: 16  Temp: 98.3 F (36.8 C)  TempSrc: Oral  SpO2: 96%  Weight: 105 kg  Height: _0  (1.702 m)   Physical Exam Vitals and nursing note reviewed.  Constitutional:      Appearance: She is obese.  Eyes:     Pupils: Pupils are equal, round, and reactive to light.  Cardiovascular:     Rate and Rhythm: Normal rate and regular rhythm.     Heart sounds: Normal heart sounds.  Pulmonary:     Effort: Pulmonary effort is normal.  Abdominal:     General: Bowel sounds are normal.     Palpations: Abdomen is soft.  Musculoskeletal:     Cervical back: Normal range of motion and neck supple.     Right lower leg: Edema present.     Left lower leg: Edema present.  Skin:    General: Skin is warm and dry.  Neurological:     General: No focal deficit present.     Mental Status: She is alert.  Psychiatric:        Mood and Affect: Mood normal.        Behavior: Behavior normal.     Data Reviewed: Relevant notes from primary care and specialist visits, past discharge summaries as available in EHR, including Care Everywhere. Prior diagnostic testing as pertinent to current admission diagnoses Updated medications and problem lists for reconciliation ED course, including vitals, labs, imaging, treatment and response to treatment Triage notes, nursing and pharmacy notes and ED provider's notes Notable results as noted in HPI Labs reviewed.  PT 13.6, INR 1.1, sodium 137, potassium 4.0, chloride 103, bicarb 26, glucose 142, BUN 15, creatinine 0.62,  calcium 9.5, troponin 26 >> 76, white count 7.9, hemoglobin 13.9, hematocrit 44, platelet count 250 Chest x-ray reviewed by me shows left retrocardiac opacity likely atelectasis. Twelve-lead EKG reviewed by me shows normal sinus rhythm, right bundle branch block, nonspecific ST changes There are no new results to review at this time.  Assessment and Plan: * NSTEMI (non-ST elevated myocardial infarction) [facility] ([place] Patient with a known history of coronary artery disease status post CABG status post PCI with stent angioplasty who presents for evaluation of chest pain associated with shortness of breath and has an uptrending troponin level. Chest pain resolved following administration of nitroglycerin. Continue aspirin, Plavix, atorvastatin, metoprolol, nitrates and heparin drip initiated in the ER. Cycle cardiac enzymes Obtain 2D echocardiogram to rule out regional wall motion abnormality Consult cardiology  Chronic diastolic CHF (congestive heart failure) (HCC) Stable and not acutely exacerbated  Last known LVEF of 50 to 55% from a 2D echocardiogram which was done in 2020 Continue metoprolol, furosemide and losartan  Depression Continue sertraline  Obesity (BMI 30-39.9) BMI 36 Complicates overall prognosis and care Life modification and exercise has been discussed with patient  CAD (coronary artery disease) Treatment as outlined in 1  Essential hypertension Continue metoprolol, losartan, nitrates and amlodipine  Diabetes mellitus ([place]) Hold oral hypoglycemic agents Maintain consistent carbohydrate diet Glycemic control with sliding scale insulin      Advance Care Planning:   Code Status: Full Code   Consults: Cardiology  Family Communication: Greater than 50% of time was spent discussing patient's condition and plan of care with her at the bedside.  All questions and concerns have been addressed.  She verbalizes understanding and agrees with the plan.  Severity of  Illness: The appropriate patient status for this patient is INPATIENT. Inpatient status is judged to be reasonable and necessary in order to provide the required intensity of service  to ensure the patient's safety. The patient's presenting symptoms, physical exam findings, and initial radiographic and laboratory data in the context of their chronic comorbidities is felt to place them at high risk for further clinical deterioration. Furthermore, it is not anticipated that the patient will be medically stable for discharge from the hospital within 2 midnights of admission.   * I certify that at the point of admission it is my clinical judgment that the patient will require inpatient hospital care spanning beyond 2 midnights from the point of admission due to high intensity of service, high risk for further deterioration and high frequency of surveillance required.*  Author: Collier Bullock, MD 04/06/2022 12:57 PM  For on call review [email or web address].

## 2022-04-06 NOTE — ED Notes (Signed)
Pt transported to XR.  

## 2022-04-07 ENCOUNTER — Encounter: Payer: Medicare (Managed Care) | Admitting: Physical Therapy

## 2022-04-07 ENCOUNTER — Other Ambulatory Visit: Payer: Self-pay

## 2022-04-07 ENCOUNTER — Inpatient Hospital Stay
Admit: 2022-04-07 | Discharge: 2022-04-07 | Disposition: A | Discharge status: Home / Self Care | Payer: Medicare Other | Attending: Internal Medicine | Admitting: Internal Medicine

## 2022-04-07 ENCOUNTER — Encounter: Payer: Self-pay | Admitting: *Deleted

## 2022-04-07 DIAGNOSIS — Z955 Presence of coronary angioplasty implant and graft: Secondary | ICD-10-CM

## 2022-04-07 DIAGNOSIS — I214 Non-ST elevation (NSTEMI) myocardial infarction: Secondary | ICD-10-CM | POA: Diagnosis not present

## 2022-04-07 LAB — ECHOCARDIOGRAM COMPLETE
AR max vel: 2.33 cm2
AV Area VTI: 2.35 cm2
AV Area mean vel: 2.22 cm2
AV Mean grad: 4 mmHg
AV Peak grad: 6.8 mmHg
Ao pk vel: 1.3 m/s
Area-P 1/2: 4.17 cm2
Height: 67 in
S' Lateral: 2.9 cm
Weight: 3703.73 oz

## 2022-04-07 LAB — GLUCOSE, CAPILLARY
Glucose-Capillary: 191 mg/dL — ABNORMAL HIGH (ref 70–99)
Glucose-Capillary: 154 mg/dL — ABNORMAL HIGH (ref 70–99)
Glucose-Capillary: 156 mg/dL — ABNORMAL HIGH (ref 70–99)

## 2022-04-07 LAB — CBC
HCT: 39.6 % (ref 36.0–46.0)
Hemoglobin: 12.4 g/dL (ref 12.0–15.0)
MCH: 24.9 pg — ABNORMAL LOW (ref 26.0–34.0)
MCHC: 31.3 g/dL (ref 30.0–36.0)
MCV: 79.7 fL — ABNORMAL LOW (ref 80.0–100.0)
Platelets: 256 10*3/uL (ref 150–400)
RBC: 4.97 MIL/uL (ref 3.87–5.11)
RDW: 17 % — ABNORMAL HIGH (ref 11.5–15.5)
WBC: 9.1 10*3/uL (ref 4.0–10.5)
nRBC: 0 % (ref 0.0–0.2)

## 2022-04-07 LAB — CBG MONITORING, ED: Glucose-Capillary: 258 mg/dL — ABNORMAL HIGH (ref 70–99)

## 2022-04-07 LAB — HEPARIN LEVEL (UNFRACTIONATED)
Heparin Unfractionated: 0.4 IU/mL (ref 0.30–0.70)
Heparin Unfractionated: 0.48 IU/mL (ref 0.30–0.70)

## 2022-04-07 LAB — TROPONIN I (HIGH SENSITIVITY)
Troponin I (High Sensitivity): 168 ng/L (ref <18)
Troponin I (High Sensitivity): 170 ng/L (ref <18)

## 2022-04-07 MED ORDER — EMPAGLIFLOZIN 25 MG PO TABS: 25.0000 mg | ORAL_TABLET | Freq: Every day | ORAL | Status: DC

## 2022-04-07 MED ORDER — DAPAGLIFLOZIN PROPANEDIOL 10 MG PO TABS
10.0000 mg | ORAL_TABLET | Freq: Every day | ORAL | Status: DC
  Administered 2022-04-07 – 2022-04-08 (×2): 10 mg via ORAL
  Filled 2022-04-07 (×2): qty 1

## 2022-04-07 MED ORDER — PERFLUTREN LIPID MICROSPHERE
1.0000 mL | INTRAVENOUS | Status: AC | PRN
  Administered 2022-04-07: 5 mL via INTRAVENOUS

## 2022-04-07 NOTE — ED Notes (Signed)
Patient reports she has been in the ED for 12 hours with nothing to eat. Ice cream and graham crackers provided per request.

## 2022-04-07 NOTE — ED Notes (Signed)
Informed RN bed assigned 

## 2022-04-07 NOTE — ED Notes (Signed)
Felicia Woods messaged via secure chat to notify of troponin of 170. Patient denies chest pain.

## 2022-04-07 NOTE — Progress Notes (Signed)
*  PRELIMINARY RESULTS* Echocardiogram 2D Echocardiogram has been performed.  Felicia Woods 04/07/2022, 8:50 AM

## 2022-04-07 NOTE — Progress Notes (Signed)
ANTICOAGULATION CONSULT NOTE  Pharmacy Consult for IV heparin Indication: chest pain/ACS   Patient Measurements: Height: '5\' 7"'  (170.2 cm) Weight: 105 kg (231 lb 7.7 oz) IBW/kg (Calculated) : 61.6 Heparin Dosing Weight: 85.4 kg   Labs: Recent Labs    04/06/22 0240 04/06/22 0308 04/06/22 0440 04/06/22 0954 04/06/22 1251 04/06/22 1656 04/06/22 2336  HGB  --  12.1  --  13.9  --   --   --   HCT  --  37.9  --  44.0  --   --   --   PLT  --  230  --  250  --   --   --   APTT  --   --   --  32  --   --   --   LABPROT  --   --   --  13.6  --   --   --   INR  --   --   --  1.1  --   --   --   HEPARINUNFRC  --   --   --   --   --  0.39 0.48  CREATININE 0.81  --   --  0.62  --   --   --   TROPONINIHS 10  --    < > 76* 93* 98* 170*   < > = values in this interval not displayed.     Estimated Creatinine Clearance: 88.6 mL/min (by C-G formula based on SCr of 0.62 mg/dL).   Medical History: Past Medical History:  Diagnosis Date   Asthma    Coronary artery disease    Diabetes mellitus without complication ([place])    Heart attack ([place])    Hyperlipidemia    Hypertension    MI (myocardial infarction) ([place])    Migraine headache with aura    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    Medications:  Based on review patient is not on any anticoagulants at home.  Assessment: Pt presents with left sided CP.  Hx CAD status post CABG and multiple stents.  Baseline aPTT 32s and INR 1.1.  CBC acceptable for starting heparin.  Goal of Therapy:  Heparin level 0.3-0.7 units/ml Monitor platelets by anticoagulation protocol: Yes   Plan:  --Heparin level is therapeutic x 2 --Continue heparin infusion at current rate of 1100 units/hr --Re-check HL daily w/ AM labs while therapeutic --Daily CBC per protocol  Renda Rolls, PharmD, [facility] 04/07/2022 12:38 AM

## 2022-04-07 NOTE — Progress Notes (Signed)
Cardiac Individual Treatment Plan  Patient Details  Name: Felicia Woods MRN: [id number] Date of Birth: May 06, 1956 Referring Provider:   Flowsheet Row Cardiac Rehab from 02/08/2022 in [facility] Cardiac and Pulmonary Rehab  Referring Provider Corky Sox       Initial Encounter Date:  Flowsheet Row Cardiac Rehab from 02/08/2022 in [facility] Cardiac and Pulmonary Rehab  Date 02/08/22       Visit Diagnosis: Status post coronary artery stent placement  Patient's Home Medications on Admission: No current facility-administered medications for this visit. No current outpatient medications on file.  Facility-Administered Medications Ordered in Other Visits:    acetaminophen (TYLENOL) tablet 650 mg, 650 mg, Oral, Q4H PRN, Agbata, Tochukwu, MD   albuterol (PROVENTIL) (2.5 MG/3ML) 0.083% nebulizer solution 3 mL, 3 mL, Nebulization, Q6H PRN, Agbata, Tochukwu, MD   amLODipine (NORVASC) tablet 10 mg, 10 mg, Oral, Daily, Agbata, Tochukwu, MD, 10 mg at 04/06/22 1429   aspirin EC tablet 81 mg, 81 mg, Oral, Daily, Agbata, Tochukwu, MD, 81 mg at 04/06/22 1430   atorvastatin (LIPITOR) tablet 80 mg, 80 mg, Oral, Daily, Agbata, Tochukwu, MD, 80 mg at 04/06/22 1428   clopidogrel (PLAVIX) tablet 75 mg, 75 mg, Oral, Daily, Agbata, Tochukwu, MD, 75 mg at 04/06/22 1433   cyclobenzaprine (FLEXERIL) tablet 10 mg, 10 mg, Oral, QHS, Agbata, Tochukwu, MD, 10 mg at 04/06/22 2338   famotidine (PEPCID) tablet 20 mg, 20 mg, Oral, BID, Agbata, Tochukwu, MD, 20 mg at 04/06/22 2337   fluticasone furoate-vilanterol (BREO ELLIPTA) 100-25 MCG/ACT 1 puff, 1 puff, Inhalation, Daily **AND** umeclidinium bromide (INCRUSE ELLIPTA) 62.5 MCG/ACT 1 puff, 1 puff, Inhalation, Daily, Agbata, Tochukwu, MD   furosemide (LASIX) tablet 40 mg, 40 mg, Oral, BID, Agbata, Tochukwu, MD, 40 mg at 04/07/22 0716   [COMPLETED] heparin bolus via infusion 4,000 Units, 4,000 Units, Intravenous, Once, 4,000 Units at 04/06/22 1125 **FOLLOWED BY** heparin ADULT infusion  100 units/mL (25000 units/267m), 1,100 Units/hr, Intravenous, Continuous, GLorin Picket RPH, Last Rate: 11 mL/hr at 04/07/22 0447, 1,100 Units/hr at 04/07/22 0447   insulin aspart (novoLOG) injection 0-15 Units, 0-15 Units, Subcutaneous, TID WC, Agbata, Tochukwu, MD, 8 Units at 04/07/22 0716   isosorbide mononitrate (IMDUR) 24 hr tablet 60 mg, 60 mg, Oral, Daily, Agbata, Tochukwu, MD   losartan (COZAAR) tablet 100 mg, 100 mg, Oral, Daily, Agbata, Tochukwu, MD   metoprolol succinate (TOPROL-XL) 24 hr tablet 25 mg, 25 mg, Oral, Daily, Agbata, Tochukwu, MD, 25 mg at 04/06/22 1429   montelukast (SINGULAIR) tablet 10 mg, 10 mg, Oral, QHS, Agbata, Tochukwu, MD, 10 mg at 04/06/22 2337   nitroGLYCERIN (NITROSTAT) SL tablet 0.4 mg, 0.4 mg, Sublingual, Q5 Min x 3 PRN, Agbata, Tochukwu, MD   ondansetron (ZOFRAN) injection 4 mg, 4 mg, Intravenous, Q6H PRN, Agbata, Tochukwu, MD, 4 mg at 04/07/22 0318   oxybutynin (DITROPAN) tablet 5 mg, 5 mg, Oral, Daily, Agbata, Tochukwu, MD, 5 mg at 04/06/22 1433   pantoprazole (PROTONIX) EC tablet 40 mg, 40 mg, Oral, Daily, Agbata, Tochukwu, MD, 40 mg at 04/06/22 1433   ranolazine (RANEXA) 12 hr tablet 500 mg, 500 mg, Oral, BID, Paraschos, Alexander, MD, 500 mg at 04/06/22 2337   sertraline (ZOLOFT) tablet 100 mg, 100 mg, Oral, Daily, Agbata, Tochukwu, MD, 100 mg at 04/06/22 1428   zolpidem (AMBIEN) tablet 5 mg, 5 mg, Oral, QHS PRN, Agbata, Tochukwu, MD, 5 mg at 04/06/22 2340  Past Medical History: Past Medical History:  Diagnosis Date   Asthma    Coronary artery disease  Diabetes mellitus without complication ([place])    Heart attack ([place])    Hyperlipidemia    Hypertension    MI (myocardial infarction) ([place])    Migraine headache with aura    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    Tobacco Use: Social History   Tobacco Use  Smoking Status Never  Smokeless Tobacco Never    Labs: Review Flowsheet  More data exists      Latest Ref Rng & Units  02/19/2021 06/10/2021 09/03/2021 09/11/2021 12/03/2021  Labs for ITP Cardiac and Pulmonary Rehab  Cholestrol 0 - 200 mg/dL - 181  - 243  -  LDL (calc) 0 - 99 mg/dL - 125  - 181  -  HDL-C >40 mg/dL - 32  - 50  -  Trlycerides <150 mg/dL - 121  - 62  -  Hemoglobin A1c 4.0 - 5.6 % 7.0  8.2  7.1  - 7.2      Exercise Target Goals: Exercise Program Goal: Individual exercise prescription set using results from initial 6 min walk test and THRR while considering  patient's activity barriers and safety.   Exercise Prescription Goal: Initial exercise prescription builds to 30-45 minutes a day of aerobic activity, 2-3 days per week.  Home exercise guidelines will be given to patient during program as part of exercise prescription that the participant will acknowledge.   Education: Aerobic Exercise: - Group verbal and visual presentation on the components of exercise prescription. Introduces F.I.T.T principle from ACSM for exercise prescriptions.  Reviews F.I.T.T. principles of aerobic exercise including progression. Written material given at graduation. Flowsheet Row Cardiac Rehab from 03/25/2022 in [facility] Cardiac and Pulmonary Rehab  Education need identified 02/08/22       Education: Resistance Exercise: - Group verbal and visual presentation on the components of exercise prescription. Introduces F.I.T.T principle from ACSM for exercise prescriptions  Reviews F.I.T.T. principles of resistance exercise including progression. Written material given at graduation.    Education: Exercise & Equipment Safety: - Individual verbal instruction and demonstration of equipment use and safety with use of the equipment. Flowsheet Row Cardiac Rehab from 03/25/2022 in [facility] Cardiac and Pulmonary Rehab  Date 02/08/22  Educator [facility]  Instruction Review Code 1- Verbalizes Understanding       Education: Exercise Physiology & General Exercise Guidelines: - Group verbal and written instruction with models to review the  exercise physiology of the cardiovascular system and associated critical values. Provides general exercise guidelines with specific guidelines to those with heart or lung disease.    Education: Flexibility, Balance, Mind/Body Relaxation: - Group verbal and visual presentation with interactive activity on the components of exercise prescription. Introduces F.I.T.T principle from ACSM for exercise prescriptions. Reviews F.I.T.T. principles of flexibility and balance exercise training including progression. Also discusses the mind body connection.  Reviews various relaxation techniques to help reduce and manage stress (i.e. Deep breathing, progressive muscle relaxation, and visualization). Balance handout provided to take home. Written material given at graduation.   Activity Barriers & Risk Stratification:  Activity Barriers & Cardiac Risk Stratification - 01/19/22 0907       Activity Barriers & Cardiac Risk Stratification   Activity Barriers Arthritis;Joint Problems;Shortness of Breath    Cardiac Risk Stratification High             6 Minute Walk:  6 Minute Walk     Row Name 02/08/22 1612         6 Minute Walk   Phase Initial  Distance 665 feet     Walk Time 5.5 minutes     # of Rest Breaks 1     MPH 1.38     METS 1.65     RPE 12     Perceived Dyspnea  1     VO2 Peak 5.77     Symptoms Yes (comment)     Comments Chest pain 7/10 during walk. Relieved with rest and then resumed walk with no chest pain.     Resting HR 84 bpm     Resting BP 110/60     Resting Oxygen Saturation  97 %     Exercise Oxygen Saturation  during 6 min walk 97 %     Max Ex. HR 108 bpm     Max Ex. BP 138/60     2 Minute Post BP 118/78              Oxygen Initial Assessment:   Oxygen Re-Evaluation:   Oxygen Discharge (Final Oxygen Re-Evaluation):   Initial Exercise Prescription:  Initial Exercise Prescription - 02/08/22 1600       Date of Initial Exercise RX and Referring Provider    Date 02/08/22    Referring Provider Corky Sox      Oxygen   Maintain Oxygen Saturation 88% or higher      NuStep   Level 1    SPM 80    Minutes 15    METs 1.65      Arm Ergometer   Level 1    RPM 30    Minutes 15    METs 1.65      REL-XR   Level 1    Speed 50    Minutes 15    METs 1.65      Biostep-RELP   Level 1    SPM 50    Minutes 15    METs 1.65      Track   Laps 7    Minutes 15    METs 1.38      Intensity   THRR 40-80% of Max Heartrate 112-141    Ratings of Perceived Exertion 11-13    Perceived Dyspnea 0-4      Progression   Progression Continue to progress workloads to maintain intensity without signs/symptoms of physical distress.      Resistance Training   Training Prescription Yes    Weight 3    Reps 10-15             Perform Capillary Blood Glucose checks as needed.  Exercise Prescription Changes:   Exercise Prescription Changes     Row Name 02/08/22 1600 03/02/22 1500 03/18/22 1700 03/29/22 1400       Response to Exercise   Blood Pressure (Admit) 110/60 124/82 122/56 128/72    Blood Pressure (Exercise) 138/60 134/70 -- --    Blood Pressure (Exit) 118/78 120/80 122/64 104/72    Heart Rate (Admit) 84 bpm 83 bpm 98 bpm 99 bpm    Heart Rate (Exercise) 108 bpm 131 bpm 132 bpm 127 bpm    Heart Rate (Exit) 85 bpm 86 bpm 95 bpm 97 bpm    Oxygen Saturation (Admit) 97 % -- -- --    Oxygen Saturation (Exercise) 97 % -- -- --    Oxygen Saturation (Exit) 97 % -- -- --    Rating of Perceived Exertion (Exercise) '12 15 11 13    ' Perceived Dyspnea (Exercise) 1 -- -- --    Symptoms Chest pain 7/10  relieved with rest. Resumed walk test and finished with no more pain. knee pain, fatigue none none    Comments 6MWT results 2nd full day of exercise -- --    Duration -- Progress to 30 minutes of  aerobic without signs/symptoms of physical distress Continue with 30 min of aerobic exercise without signs/symptoms of physical distress. Continue with 30 min of  aerobic exercise without signs/symptoms of physical distress.    Intensity -- THRR unchanged THRR unchanged THRR unchanged      Progression   Progression -- Continue to progress workloads to maintain intensity without signs/symptoms of physical distress. Continue to progress workloads to maintain intensity without signs/symptoms of physical distress. Continue to progress workloads to maintain intensity without signs/symptoms of physical distress.    Average METs -- 2.19 2.81 2.71      Resistance Training   Training Prescription -- Yes Yes Yes    Weight -- 3 lb 3 lb 3 lb    Reps -- 10-15 10-15 10-15      Interval Training   Interval Training -- No No No      Arm Ergometer   Level -- '1 1 1    ' Minutes -- '15 15 15    ' METs -- 2.4 2.3 2.5      REL-XR   Level -- -- 3 2    Minutes -- -- 15 15    METs -- -- -- 4      Track   Laps -- '20 21 12    ' Minutes -- '15 15 15    ' METs -- 2.09 2.14 1.65      Oxygen   Maintain Oxygen Saturation -- -- 88% or higher 88% or higher             Exercise Comments:   Exercise Comments     Row Name 02/18/22 1001 03/11/22 1036         Exercise Comments First full day of exercise!  Patient was oriented to gym and equipment including functions, settings, policies, and procedures.  Patient's individual exercise prescription and treatment plan were reviewed.  All starting workloads were established based on the results of the 6 minute walk test done at initial orientation visit.  The plan for exercise progression was also introduced and progression will be customized based on patient's performance and goals. Felicia Woods is using nitro sl at least 2 x a day for chest pain with exertion. Advised her to notify MD of this. Also gave her another med name  to review with her physician for angina control.               Exercise Goals and Review:   Exercise Goals     Row Name 02/08/22 1624             Exercise Goals   Increase Physical Activity Yes        Intervention Provide advice, education, support and counseling about physical activity/exercise needs.;Develop an individualized exercise prescription for aerobic and resistive training based on initial evaluation findings, risk stratification, comorbidities and participant's personal goals.       Expected Outcomes Short Term: Attend rehab on a regular basis to increase amount of physical activity.;Long Term: Add in home exercise to make exercise part of routine and to increase amount of physical activity.;Long Term: Exercising regularly at least 3-5 days a week.       Increase Strength and Stamina Yes       Intervention Provide advice, education, support  and counseling about physical activity/exercise needs.;Develop an individualized exercise prescription for aerobic and resistive training based on initial evaluation findings, risk stratification, comorbidities and participant's personal goals.       Expected Outcomes Short Term: Increase workloads from initial exercise prescription for resistance, speed, and METs.;Short Term: Perform resistance training exercises routinely during rehab and add in resistance training at home;Long Term: Improve cardiorespiratory fitness, muscular endurance and strength as measured by increased METs and functional capacity (6MWT)       Able to understand and use rate of perceived exertion (RPE) scale Yes       Intervention Provide education and explanation on how to use RPE scale       Expected Outcomes Short Term: Able to use RPE daily in rehab to express subjective intensity level;Long Term:  Able to use RPE to guide intensity level when exercising independently       Able to understand and use Dyspnea scale Yes       Intervention Provide education and explanation on how to use Dyspnea scale       Expected Outcomes Short Term: Able to use Dyspnea scale daily in rehab to express subjective sense of shortness of breath during exertion;Long Term: Able to use Dyspnea  scale to guide intensity level when exercising independently       Knowledge and understanding of Target Heart Rate Range (THRR) Yes       Intervention Provide education and explanation of THRR including how the numbers were predicted and where they are located for reference       Expected Outcomes Short Term: Able to state/look up THRR;Short Term: Able to use daily as guideline for intensity in rehab;Long Term: Able to use THRR to govern intensity when exercising independently       Able to check pulse independently Yes       Intervention Provide education and demonstration on how to check pulse in carotid and radial arteries.;Review the importance of being able to check your own pulse for safety during independent exercise       Expected Outcomes Short Term: Able to explain why pulse checking is important during independent exercise;Long Term: Able to check pulse independently and accurately       Understanding of Exercise Prescription Yes       Intervention Provide education, explanation, and written materials on patient's individual exercise prescription       Expected Outcomes Short Term: Able to explain program exercise prescription;Long Term: Able to explain home exercise prescription to exercise independently                Exercise Goals Re-Evaluation :  Exercise Goals Re-Evaluation     Row Name 02/18/22 1002 03/02/22 1521 03/18/22 1749 03/29/22 1404       Exercise Goal Re-Evaluation   Exercise Goals Review Able to understand and use rate of perceived exertion (RPE) scale;Knowledge and understanding of Target Heart Rate Range (THRR);Able to understand and use Dyspnea scale;Able to check pulse independently;Understanding of Exercise Prescription Increase Physical Activity;Increase Strength and Stamina;Understanding of Exercise Prescription Increase Physical Activity;Increase Strength and Stamina;Understanding of Exercise Prescription Increase Physical Activity;Increase Strength and  Stamina;Understanding of Exercise Prescription    Comments Reviewed RPE and dyspnea scales, THR and program prescription with pt today.  Pt voiced understanding and was given a copy of goals to take home. Felicia Woods is off to a good start in rehab. She has only worked on the arm ergometer and track so far, however, was able to  complete full 20 laps on the track! Her HR reached her THR. We will continue to monitor her progress as she progresses in the program. Felicia Woods is doing well in rehab. She recently improved her average overall MET level to 2.81 METs. She also improved to level 3 on the XR and got up to 21 laps on the track. We will continue to monitor her progress. Felicia Woods is doing well in rehab. She has done as many as 21 laps, but is now back down to 12 laps.  She is on level 2 for the XR.  We will talk to her about getting consistent with workloads to see improvement.    Expected Outcomes Short: Use RPE daily to regulate intensity. Long: Follow program prescription in THR. Short: Continue to work up laps on the track Long: Work on increasing overall MET level Short: Continue to increase workloads and laps on the track. Long: Continue to increase strength and stamina. Short: Attend rehab consistently and keep workloads consistent  Long: Cointinue to improve stamina             Discharge Exercise Prescription (Final Exercise Prescription Changes):  Exercise Prescription Changes - 03/29/22 1400       Response to Exercise   Blood Pressure (Admit) 128/72    Blood Pressure (Exit) 104/72    Heart Rate (Admit) 99 bpm    Heart Rate (Exercise) 127 bpm    Heart Rate (Exit) 97 bpm    Rating of Perceived Exertion (Exercise) 13    Symptoms none    Duration Continue with 30 min of aerobic exercise without signs/symptoms of physical distress.    Intensity THRR unchanged      Progression   Progression Continue to progress workloads to maintain intensity without signs/symptoms of physical distress.     Average METs 2.71      Resistance Training   Training Prescription Yes    Weight 3 lb    Reps 10-15      Interval Training   Interval Training No      Arm Ergometer   Level 1    Minutes 15    METs 2.5      REL-XR   Level 2    Minutes 15    METs 4      Track   Laps 12    Minutes 15    METs 1.65      Oxygen   Maintain Oxygen Saturation 88% or higher             Nutrition:  Target Goals: Understanding of nutrition guidelines, daily intake of sodium <157m, cholesterol <2053m calories 30% from fat and 7% or less from saturated fats, daily to have 5 or more servings of fruits and vegetables.  Education: All About Nutrition: -Group instruction provided by verbal, written material, interactive activities, discussions, models, and posters to present general guidelines for heart healthy nutrition including fat, fiber, MyPlate, the role of sodium in heart healthy nutrition, utilization of the nutrition label, and utilization of this knowledge for meal planning. Follow up email sent as well. Written material given at graduation. [facility]  Education need identified 02/08/22       Biometrics:  Pre Biometrics - 02/08/22 1627       Pre Biometrics   Height 5' 5.75" (1.67 m)    Weight 234 lb 1.6 oz (106.2 kg)    BMI (Calculated) 38.07    Single Leg  Stand 6.6 seconds              Nutrition Therapy Plan and Nutrition Goals:  Nutrition Therapy & Goals - 02/08/22 1628       Intervention Plan   Intervention Prescribe, educate and counsel regarding individualized specific dietary modifications aiming towards targeted core components such as weight, hypertension, lipid management, diabetes, heart failure and other comorbidities.    Expected Outcomes Short Term Goal: Understand basic principles of dietary content, such as calories, fat, sodium, cholesterol and nutrients.;Short Term Goal: A plan has been  developed with personal nutrition goals set during dietitian appointment.;Long Term Goal: Adherence to prescribed nutrition plan.             Nutrition Assessments:  MEDIFICTS Score Key: ?70 Need to make dietary changes  40-70 Heart Healthy Diet ? 40 Therapeutic Level Cholesterol Diet  Flowsheet Row Cardiac Rehab from 02/08/2022 in [facility] Cardiac and Pulmonary Rehab  Picture Your Plate Total Score on Admission 41      Picture Your Plate Scores: <81 Unhealthy dietary pattern with much room for improvement. 41-50 Dietary pattern unlikely to meet recommendations for good health and room for improvement. 51-60 More healthful dietary pattern, with some room for improvement.  >60 Healthy dietary pattern, although there may be some specific behaviors that could be improved.    Nutrition Goals Re-Evaluation:   Nutrition Goals Discharge (Final Nutrition Goals Re-Evaluation):   Psychosocial: Target Goals: Acknowledge presence or absence of significant depression and/or stress, maximize coping skills, provide positive support system. Participant is able to verbalize types and ability to use techniques and skills needed for reducing stress and depression.   Education: Stress, Anxiety, and Depression - Group verbal and visual presentation to define topics covered.  Reviews how body is impacted by stress, anxiety, and depression.  Also discusses healthy ways to reduce stress and to treat/manage anxiety and depression.  Written material given at graduation. Flowsheet Row Cardiac Rehab from 03/25/2022 in [facility] Cardiac and Pulmonary Rehab  Education need identified 02/08/22       Education: Sleep Hygiene -Provides group verbal and written instruction about how sleep can affect your health.  Define sleep hygiene, discuss sleep cycles and impact of sleep habits. Review good sleep hygiene tips.    Initial Review & Psychosocial Screening:  Initial Psych Review & Screening - 01/19/22 0908        Initial Review   Current issues with None Identified      Family Dynamics   Good Support System? Yes   husband and her children     Barriers   Psychosocial barriers to participate in program There are no identifiable barriers or psychosocial needs.;The patient should benefit from training in stress management and relaxation.      Screening Interventions   Interventions Encouraged to exercise;To provide support and resources with identified psychosocial needs;Provide feedback about the scores to participant    Expected Outcomes Short Term goal: Utilizing psychosocial counselor, staff and physician to assist with identification of specific Stressors or current issues interfering with healing process. Setting desired goal for each stressor or current issue identified.;Long Term Goal: Stressors or current issues are controlled or eliminated.;Short Term goal: Identification and review with participant of any Quality of Life or Depression concerns found by scoring the questionnaire.;Long Term goal: The participant improves quality of Life and PHQ9 Scores as seen by post scores and/or verbalization of changes             Quality of Life Scores:  Quality of Life - 02/08/22 1630       Quality of Life   Select Quality of Life      Quality of Life Scores   Health/Function Pre 23.93 %    Socioeconomic Pre 28.17 %    Psych/Spiritual Pre 30 %    Family Pre 30 %    GLOBAL Pre 27 %            Scores of 19 and below usually indicate a poorer quality of life in these areas.  A difference of  2-3 points is a clinically meaningful difference.  A difference of 2-3 points in the total score of the Quality of Life Index has been associated with significant improvement in overall quality of life, self-image, physical symptoms, and general health in studies assessing change in quality of life.  PHQ-9: Review Flowsheet  More data exists      02/08/2022 12/03/2021 09/03/2021 03/12/2021 02/19/2021   Depression screen PHQ 2/9  Decreased Interest 1 0 0 0 0  Down, Depressed, Hopeless 0 0 0 0 0  PHQ - 2 Score 1 0 0 0 0  Altered sleeping 0 - - - -  Tired, decreased energy 0 - - - -  Change in appetite 0 - - - -  Feeling bad or failure about yourself  0 - - - -  Trouble concentrating 0 - - - -  Moving slowly or fidgety/restless 0 - - - -  Suicidal thoughts 0 - - - -  PHQ-9 Score 1 - - - -  Difficult doing work/chores Not difficult at all - - - -   Interpretation of Total Score  Total Score Depression Severity:  1-4 = Minimal depression, 5-9 = Mild depression, 10-14 = Moderate depression, 15-19 = Moderately severe depression, 20-27 = Severe depression   Psychosocial Evaluation and Intervention:  Psychosocial Evaluation - 01/19/22 0912       Psychosocial Evaluation & Interventions   Comments Felicia Woods is again returning to [facility] after another stent. SHe has no barriers to attending the program. She now lives in a house and no longer in a second floor apartment. Her support team is her husband and her children. She is ready to return and continue to learn heart healthy living.    Expected Outcomes STG Felicia Woods will attend all scheduled sessions, she will progress with her exercise LTG Felicia Woods will be able to continue with ehr exercie progression and maintain control of her risk factors.    Continue Psychosocial Services  Follow up required by staff             Psychosocial Re-Evaluation:   Psychosocial Discharge (Final Psychosocial Re-Evaluation):   Vocational Rehabilitation: Provide vocational rehab assistance to qualifying candidates.   Vocational Rehab Evaluation & Intervention:  Vocational Rehab - 01/19/22 0912       Initial Vocational Rehab Evaluation & Intervention   Assessment shows need for Vocational Rehabilitation No      Vocational Rehab Re-Evaulation   Comments no request fro VR             Education: Education Goals: Education classes will be provided on a  variety of topics geared toward better understanding of heart health and risk factor modification. Participant will state understanding/return demonstration of topics presented as noted by education test scores.  Learning Barriers/Preferences:   General Cardiac Education Topics:  AED/CPR: - Group verbal and written instruction with the use of models to demonstrate the basic use of the AED with  the basic ABC's of resuscitation.   Anatomy and Cardiac Procedures: - Group verbal and visual presentation and models provide information about basic cardiac anatomy and function. Reviews the testing methods done to diagnose heart disease and the outcomes of the test results. Describes the treatment choices: Medical Management, Angioplasty, or Coronary Bypass Surgery for treating various heart conditions including Myocardial Infarction, Angina, Valve Disease, and Cardiac Arrhythmias.  Written material given at graduation. Flowsheet Row Cardiac Rehab from 03/25/2022 in [facility] Cardiac and Pulmonary Rehab  Education need identified 02/08/22  Date 03/25/22  Educator sb  Instruction Review Code 1- Verbalizes Understanding       Medication Safety: - Group verbal and visual instruction to review commonly prescribed medications for heart and lung disease. Reviews the medication, class of the drug, and side effects. Includes the steps to properly store meds and maintain the prescription regimen.  Written material given at graduation.   Intimacy: - Group verbal instruction through game format to discuss how heart and lung disease can affect sexual intimacy. Written material given at graduation..   Know Your Numbers and Heart Failure: - Group verbal and visual instruction to discuss disease risk factors for cardiac and pulmonary disease and treatment options.  Reviews associated critical values for Overweight/Obesity, Hypertension, Cholesterol, and Diabetes.  Discusses basics of heart failure: signs/symptoms  and treatments.  Introduces Heart Failure Zone chart for action plan for heart failure.  Written material given at graduation.   Infection Prevention: - Provides verbal and written material to individual with discussion of infection control including proper hand washing and proper equipment cleaning during exercise session. Flowsheet Row Cardiac Rehab from 03/25/2022 in [facility] Cardiac and Pulmonary Rehab  Date 02/08/22  Educator [place] [facility]  Instruction Review Code 1- Verbalizes Understanding       Falls Prevention: - Provides verbal and written material to individual with discussion of falls prevention and safety. Flowsheet Row Cardiac Rehab from 03/25/2022 in [facility] Cardiac and Pulmonary Rehab  Date 02/08/22  Educator [facility]  Instruction Review Code 1- Verbalizes Understanding       Other: -Provides group and verbal instruction on various topics (see comments)   Knowledge Questionnaire Score:  Knowledge Questionnaire Score - 02/08/22 1631       Knowledge Questionnaire Score   Pre Score 19/26             Core Components/Risk Factors/Patient Goals at Admission:  Personal Goals and Risk Factors at Admission - 02/08/22 1628       Core Components/Risk Factors/Patient Goals on Admission    Weight Management Yes    Intervention Weight Management: Develop a combined nutrition and exercise program designed to reach desired caloric intake, while maintaining appropriate intake of nutrient and fiber, sodium and fats, and appropriate energy expenditure required for the weight goal.;Weight Management: Provide education and appropriate resources to help participant work on and attain dietary goals.;Weight Management/Obesity: Establish reasonable short term and long term weight goals.;Obesity: Provide education and appropriate resources to help participant work on and attain dietary goals.    Admit Weight 234 lb 1.6 oz (106.2 kg)    Goal Weight: Short Term 228 lb (103.4 kg)    Goal Weight: Long Term  200 lb (90.7 kg)    Expected Outcomes Short Term: Continue to assess and modify interventions until short term weight is achieved;Long Term: Adherence to nutrition and physical activity/exercise program aimed toward attainment of established weight goal;Weight Loss: Understanding of general recommendations for a balanced deficit meal plan, which promotes 1-2 lb  weight loss per week and includes a negative energy balance of [phone] kcal/d;Understanding recommendations for meals to include 15-35% energy as protein, 25-35% energy from fat, 35-60% energy from carbohydrates, less than 216m of dietary cholesterol, 20-35 gm of total fiber daily;Understanding of distribution of calorie intake throughout the day with the consumption of 4-5 meals/snacks    Diabetes Yes    Intervention Provide education about signs/symptoms and action to take for hypo/hyperglycemia.;Provide education about proper nutrition, including hydration, and aerobic/resistive exercise prescription along with prescribed medications to achieve blood glucose in normal ranges: Fasting glucose 65-99 mg/dL    Expected Outcomes Short Term: Participant verbalizes understanding of the signs/symptoms and immediate care of hyper/hypoglycemia, proper foot care and importance of medication, aerobic/resistive exercise and nutrition plan for blood glucose control.;Long Term: Attainment of HbA1C < 7%.    Hypertension Yes    Intervention Provide education on lifestyle modifcations including regular physical activity/exercise, weight management, moderate sodium restriction and increased consumption of fresh fruit, vegetables, and low fat dairy, alcohol moderation, and smoking cessation.;Monitor prescription use compliance.    Expected Outcomes Short Term: Continued assessment and intervention until BP is < 140/959mHG in hypertensive participants. < 130/8078mG in hypertensive participants with diabetes, heart failure or chronic kidney disease.;Long Term:  Maintenance of blood pressure at goal levels.    Lipids Yes    Intervention Provide education and support for participant on nutrition & aerobic/resistive exercise along with prescribed medications to achieve LDL <51m22mDL >40mg27m Expected Outcomes Short Term: Participant states understanding of desired cholesterol values and is compliant with medications prescribed. Participant is following exercise prescription and nutrition guidelines.;Long Term: Cholesterol controlled with medications as prescribed, with individualized exercise RX and with personalized nutrition plan. Value goals: LDL < 51mg,4m > 40 mg.             Education:Diabetes - Individual verbal and written instruction to review signs/symptoms of diabetes, desired ranges of glucose level fasting, after meals and with exercise. Acknowledge that pre and post exercise glucose checks will be done for 3 sessions at entry of program. [place]  Education need identified 02/08/22       Core Components/Risk Factors/Patient Goals Review:    Core Components/Risk Factors/Patient Goals at Discharge (Final Review):    ITP Comments:  ITP Comments     Row Name 01/19/22 0915 02/08/22 1611 02/10/22 1006 02/18/22 1001 03/10/22 1415   ITP Comments Virtual orientation call completed today. shehas an appointment on Date: 01/26/2022  for EP eval and gym Orientation.  [facility] . Completed 6MWT and gym orientation. Initial ITP created and sent for review to Dr. Mark MEmily Filbertcal Director. 30 Day review completed. Medical Director ITP review done, changes made as directed, and signed approval by Medical Director.   NEW First full day of exercise!  Patient was oriented to gym and equipment including functions, settings, policies, and procedures.  Patient's individual exercise prescription and treatment plan were reviewed.   All starting workloads were established based on the results of the 6 minute walk test done at initial orientation visit.  The plan for exercise progression was also introduced and progression will be customized based on patient's performance and goals. 30 Day review completed. Medical Director ITP review done, changes made as directed, and signed approval by Medical Director.   NEW   [place]09/07/23 1035 04/06/22 1050  04/07/22 0809       ITP Comments Semaj is using nitro sl at least 2 x a day for chest pain with exertion. Advised her to notify MD of this. Also gave her another med name  to review with her physician for angina control. Patient called to let us know she is getting admitted to the hospital for cardiac issues. Will place patient on medical hold from rehab and will need clearance for her to return. 30 Day review completed. Medical Director ITP review done, changes made as directed, and signed approval by Medical Director.   Out for medical reason              Comments:

## 2022-04-07 NOTE — Progress Notes (Signed)
[facility] NAME: Felicia Woods    MR#:  [id number]  DATE OF BIRTH:  [birth year]  SUBJECTIVE:  chest pain. Patient is symptom-free at present. Currently getting IV heparin drip. No shortness of breath. Patient declined any further cardiac cath since she is had equal standing the past.    VITALS:  Blood pressure 112/70, pulse 79, temperature 97.9 F (36.6 C), temperature source Oral, resp. rate 16, height '5\' 7"'$  (1.702 m), weight 105 kg, SpO2 95 %.  PHYSICAL EXAMINATION:   GENERAL:  66 y.o.-year-old patient lying in the bed with no acute distress. obese LUNGS: Normal breath sounds bilaterally, no wheezing, rales, rhonchi.  CARDIOVASCULAR: S1, S2 normal. No murmurs, rubs, or gallops.  ABDOMEN: Soft, nontender, nondistended. Bowel sounds present.  EXTREMITIES: No  edema b/l.    NEUROLOGIC: nonfocal  patient is alert and awake SKIN: No obvious rash, lesion, or ulcer.   LABORATORY PANEL:  CBC Recent Labs  Lab 04/07/22 0504  WBC 9.1  HGB 12.4  HCT 39.6  PLT 256    Chemistries  Recent Labs  Lab 04/06/22 0954  NA 137  K 4.0  CL 103  CO2 26  GLUCOSE 142*  BUN 15  CREATININE 0.62  CALCIUM 9.5   Cardiac Enzymes No results for input(s): "TROPONINI" in the last 168 hours. RADIOLOGY:  ECHOCARDIOGRAM COMPLETE  Result Date: 04/07/2022    ECHOCARDIOGRAM REPORT   Patient Name:   Felicia Woods Date of Exam: 04/07/2022 Medical Rec #:  [id number]      Height:       67.0 in Accession #:    [id number]     Weight:       231.5 lb Date of Birth:  [birth year]     BSA:          2.152 m Patient Age:    60 years       BP:           138/97 mmHg Patient Gender: F              HR:           73 bpm. Exam Location:  [facility] Procedure: 2D Echo, Color Doppler, Cardiac Doppler and Intracardiac            Opacification Agent Indications:     I21.4 NSTEMI  History:         Patient has prior history of Echocardiogram examinations, most                   recent 05/31/2019. CAD, Prior CABG; Risk Factors:Hypertension,                  Diabetes and Dyslipidemia.  Sonographer:     Charmayne Sheer Referring Phys:  [id number] AGBATA Diagnosing Phys: Isaias Cowman MD  Sonographer Comments: Suboptimal apical window. IMPRESSIONS  1. Left ventricular ejection fraction, by estimation, is 55 to 60%. The left ventricle has normal function. The left ventricle has no regional wall motion abnormalities. Left ventricular diastolic parameters are consistent with Grade I diastolic dysfunction (impaired relaxation).  2. Right ventricular systolic function is normal. The right ventricular size is normal.  3. The mitral valve is normal in structure. Mild mitral valve regurgitation. No evidence of mitral stenosis.  4. The aortic valve is normal in structure. Aortic valve regurgitation is not visualized. No aortic stenosis is present.  5. The inferior vena cava is normal in size with greater  than 50% respiratory variability, suggesting right atrial pressure of 3 mmHg. FINDINGS  Left Ventricle: Left ventricular ejection fraction, by estimation, is 55 to 60%. The left ventricle has normal function. The left ventricle has no regional wall motion abnormalities. Definity contrast agent was given IV to delineate the left ventricular  endocardial borders. The left ventricular internal cavity size was normal in size. There is no left ventricular hypertrophy. Left ventricular diastolic parameters are consistent with Grade I diastolic dysfunction (impaired relaxation). Right Ventricle: The right ventricular size is normal. No increase in right ventricular wall thickness. Right ventricular systolic function is normal. Left Atrium: Left atrial size was normal in size. Right Atrium: Right atrial size was normal in size. Pericardium: There is no evidence of pericardial effusion. Mitral Valve: The mitral valve is normal in structure. Mild mitral valve regurgitation. No evidence of mitral valve  stenosis. Tricuspid Valve: The tricuspid valve is normal in structure. Tricuspid valve regurgitation is mild . No evidence of tricuspid stenosis. Aortic Valve: The aortic valve is normal in structure. Aortic valve regurgitation is not visualized. No aortic stenosis is present. Aortic valve mean gradient measures 4.0 mmHg. Aortic valve peak gradient measures 6.8 mmHg. Aortic valve area, by VTI measures 2.35 cm. Pulmonic Valve: The pulmonic valve was normal in structure. Pulmonic valve regurgitation is not visualized. No evidence of pulmonic stenosis. Aorta: The aortic root is normal in size and structure. Venous: The inferior vena cava is normal in size with greater than 50% respiratory variability, suggesting right atrial pressure of 3 mmHg. IAS/Shunts: No atrial level shunt detected by color flow Doppler.  LEFT VENTRICLE PLAX 2D LVIDd:         4.20 cm   Diastology LVIDs:         2.90 cm   LV e' medial:    9.36 cm/s LV PW:         1.00 cm   LV E/e' medial:  8.7 LV IVS:        1.00 cm   LV e' lateral:   13.70 cm/s LVOT diam:     1.90 cm   LV E/e' lateral: 6.0 LV SV:         59 LV SV Index:   27 LVOT Area:     2.84 cm  RIGHT VENTRICLE RV Basal diam:  2.70 cm RV S prime:     5.77 cm/s LEFT ATRIUM           Index        RIGHT ATRIUM           Index LA diam:      3.70 cm 1.72 cm/m   RA Area:     12.00 cm LA Vol (A4C): 26.7 ml 12.41 ml/m  RA Volume:   24.30 ml  11.29 ml/m  AORTIC VALVE                    PULMONIC VALVE AV Area (Vmax):    2.33 cm     PV Vmax:       0.91 m/s AV Area (Vmean):   2.22 cm     PV Peak grad:  3.3 mmHg AV Area (VTI):     2.35 cm AV Vmax:           130.00 cm/s AV Vmean:          89.000 cm/s AV VTI:            0.251 m AV Peak Grad:  6.8 mmHg AV Mean Grad:      4.0 mmHg LVOT Vmax:         107.00 cm/s LVOT Vmean:        69.700 cm/s LVOT VTI:          0.208 m LVOT/AV VTI ratio: 0.83  AORTA Ao Root diam: 2.90 cm MITRAL VALVE MV Area (PHT): 4.17 cm     SHUNTS MV Decel Time: 182 msec      Systemic VTI:  0.21 m MV E velocity: 81.80 cm/s   Systemic Diam: 1.90 cm MV A velocity: 101.00 cm/s MV E/A ratio:  0.81 Isaias Cowman MD Electronically signed by Isaias Cowman MD Signature Date/Time: 04/07/2022/1:17:12 PM    Final    DG Chest 2 View  Result Date: 04/06/2022 CLINICAL DATA:  Chest pain EXAM: CHEST - 2 VIEW COMPARISON:  01/11/2022 FINDINGS: Mild cardiomegaly. Remote median sternotomy. Left retrocardiac opacity likely atelectasis. IMPRESSION: Left retrocardiac opacity likely atelectasis. Electronically Signed   By: Ulyses Jarred M.D.   On: 04/06/2022 03:30    Assessment and Plan   Felicia Woods is a 66 y.o. female with medical history significant for hypertension, diabetes mellitus, obesity, coronary artery disease status post PCI with stent angioplasty, status post CABG who presented to the ER in the early hours of the morning for evaluation of left-sided chest pain which woke her up from sleep rated a 6 x 10 in intensity at its worst with radiation to her left arm. Patient noted to have an uptrending troponin level and was started on a heparin drip.   NSTEMI (non-ST elevated myocardial infarction) [facility]) --Patient with a known history of coronary artery disease status post CABG status post PCI with stent angioplasty who presents for evaluation of chest pain associated with shortness of breath and has an uptrending troponin level. --Chest pain resolved following administration of nitroglycerin. --Continue aspirin, Plavix, atorvastatin, metoprolol, nitrates and heparin drip initiated in the ER for 48 hours --enzymes flat --Cont medical mnx per dr Josefa Half. Cath deferred. Added Ranexa   Chronic diastolic CHF (congestive heart failure) (HCC) --Stable and not acutely exacerbated  --Last known LVEF of 50 to 55% from a 2D echocardiogram which was done in 2020 --Continue metoprolol, furosemide and losartan   Depression --Continue sertraline   Obesity (BMI 30-39.9) --BMI  36 --Complicates overall prognosis and care --Life modification and exercise has been discussed with patient   CAD (coronary artery disease) --Treatment as outlined in 1   Essential hypertension --Continue metoprolol, losartan, nitrates and amlodipine   Diabetes mellitus ([place]) --on trulicity, jardiance and farxiga at home--not sure if she is taking ll 3 Maintain consistent carbohydrate diet Glycemic control with sliding scale insulin     Family communication :none Consults : [place] [facility] CODE STATUS: full DVT Prophylaxis :heparin gtt Level of care: Progressive Status is: Inpatient Remains inpatient appropriate because: NSTEMI on heparin gtt  EDD 10/5/    TOTAL TIME TAKING CARE OF THIS PATIENT: 35 minutes.  >50% time spent on counselling and coordination of care  Note: This dictation was prepared with Dragon dictation along with smaller phrase technology. Any transcriptional errors that result from this process are unintentional.  Fritzi Mandes M.D    [facility]   CC: Primary care physician; Mylinda Latina, PA-C

## 2022-04-07 NOTE — Progress Notes (Signed)
ANTICOAGULATION CONSULT NOTE  Pharmacy Consult for IV heparin Indication: chest pain/ACS   Patient Measurements: Height: _0  (170.2 cm) Weight: 105 kg (231 lb 7.7 oz) IBW/kg (Calculated) : 61.6 Heparin Dosing Weight: 85.4 kg   Labs: Recent Labs     0000 04/06/22 0240 04/06/22 0308 04/06/22 0440 04/06/22 0954 04/06/22 1251 04/06/22 1656 04/06/22 2336 04/07/22 0504  HGB   < >  --  12.1  --  13.9  --   --   --  12.4  HCT  --   --  37.9  --  44.0  --   --   --  39.6  PLT  --   --  230  --  250  --   --   --  256  APTT  --   --   --   --  32  --   --   --   --   LABPROT  --   --   --   --  13.6  --   --   --   --   INR  --   --   --   --  1.1  --   --   --   --   HEPARINUNFRC  --   --   --   --   --   --  0.39 0.48 0.40  CREATININE  --  0.81  --   --  0.62  --   --   --   --   TROPONINIHS  --  10  --    < > 76* 93* 98* 170*  --    < > = values in this interval not displayed.     Estimated Creatinine Clearance: 88.6 mL/min (by C-G formula based on SCr of 0.62 mg/dL).   Medical History: Past Medical History:  Diagnosis Date   Asthma    Coronary artery disease    Diabetes mellitus without complication ([place])    Heart attack ([place])    Hyperlipidemia    Hypertension    MI (myocardial infarction) ([place])    Migraine headache with aura    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    Medications:  Based on review patient is not on any anticoagulants at home.  Assessment: Pt presents with left sided CP.  Hx CAD status post CABG and multiple stents.  Baseline aPTT 32s and INR 1.1.  CBC acceptable for starting heparin.  Goal of Therapy:  Heparin level 0.3-0.7 units/ml Monitor platelets by anticoagulation protocol: Yes   Plan:  --Heparin level is therapeutic x 3 --Continue heparin infusion at current rate of 1100 units/hr --Re-check HL daily w/ AM labs while therapeutic --Daily CBC per protocol  Renda Rolls, PharmD, [facility] 04/07/2022 6:05 AM

## 2022-04-07 NOTE — Progress Notes (Signed)
[facility] Cardiology  CARDIOLOGY CONSULT NOTE  Patient ID: Felicia Woods MRN: [id number] DOB/AGE: [birth year] 65 y.o.  Admit date: 04/06/2022 Referring Physician [place] Primary Physician [place] Primary Cardiologist Nehemiah Massed Reason for Consultation unstable angina  HPI: 66 year old female referred for evaluation of elevated troponin consistent with unstable angina versus NSTEMI.  The patient has known coronary artery disease, status post CABG x4, and multiple coronary stents.  Most recent cardiac catheterization 12/24/2021 revealed patent LIMA to LAD, occluded SVG to PDA, occluded SVG to D1, patent SVG to OM 2 with high-grade stenosis in the distal segment and underwent PCI with drug-eluting stent.  The patient has undergone previous PCI 02/19/2017, 02/13/2019, 03/08/2019, and 11/04/2020.  The patient awoke early in the morning, developed 6 out of 10 chest discomfort, presented to [facility] ED.  ECG revealed sinus rhythm at 64 bpm with right bundle branch block without ischemic ST-T wave changes.  High-sensitivity troponin was mildly elevated 26 and 76.  Chest pain resolved and the patient is discharged and was to be seen by Dr. Nehemiah Massed in the office this morning.  However, after arriving at the clinic, the patient redeveloped chest pain and return to [facility] ED.  Interval history: -Feels okay today, denies further chest discomfort, no shortness of breath or heart racing -Remains on heparin infusion -Discussed at length her cardiac history and testing we have done so far -Echo performed this morning, pending read  Review of systems complete and found to be negative unless listed above     Past Medical History:  Diagnosis Date   Asthma    Coronary artery disease    Diabetes mellitus without complication ([place])    Heart attack ([place])    Hyperlipidemia    Hypertension    MI (myocardial infarction) ([place])    Migraine headache with aura    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    Past Surgical History:   Procedure Laterality Date   ABDOMINAL HYSTERECTOMY     CARDIAC CATHETERIZATION     CARDIAC CATHETERIZATION N/A 10/18/2015   Procedure: Left Heart Cath and Cors/Grafts Angiography;  Surgeon: Lorretta Harp, MD;  Location: [place] [facility];  Service: Cardiovascular;  Laterality: N/A;   CARDIAC CATHETERIZATION N/A 10/18/2015   Procedure: Coronary Stent Intervention;  Surgeon: Lorretta Harp, MD;  Location: [place] [facility];  Service: Cardiovascular;  Laterality: N/A;   CARDIAC SURGERY     CHOLECYSTECTOMY     COLONOSCOPY WITH PROPOFOL N/A 12/02/2021   Procedure: COLONOSCOPY WITH PROPOFOL;  Surgeon: Jonathon Bellows, MD;  Location: [facility] ENDOSCOPY;  Service: Gastroenterology;  Laterality: N/A;   CORONARY ANGIOPLASTY     CORONARY ARTERY BYPASS GRAFT     4 vessels - 2010   CORONARY STENT INTERVENTION N/A 02/16/2017   Procedure: CORONARY STENT INTERVENTION;  Surgeon: Yolonda Kida, MD;  Location: [place] [facility];  Service: Cardiovascular;  Laterality: N/A;   CORONARY STENT INTERVENTION N/A 02/13/2019   Procedure: CORONARY STENT INTERVENTION;  Surgeon: Nelva Bush, MD;  Location: [place] [facility];  Service: Cardiovascular;  Laterality: N/A;  SVG to RCA   CORONARY STENT INTERVENTION Left 03/08/2019   Procedure: CORONARY STENT INTERVENTION;  Surgeon: Yolonda Kida, MD;  Location: [place] [facility];  Service: Cardiovascular;  Laterality: Left;   CORONARY STENT INTERVENTION N/A 11/04/2020   Procedure: CORONARY STENT INTERVENTION;  Surgeon: Nelva Bush, MD;  Location: [place] [facility];  Service: Cardiovascular;  Laterality: N/A;   CORONARY STENT INTERVENTION N/A 12/24/2021   Procedure:  CORONARY STENT INTERVENTION;  Surgeon: Andrez Grime, MD;  Location: [place] [facility];  Service: Cardiovascular;  Laterality: N/A;   ESOPHAGOGASTRODUODENOSCOPY N/A 12/02/2021   Procedure: ESOPHAGOGASTRODUODENOSCOPY (EGD);  Surgeon: Jonathon Bellows, MD;  Location: [facility] ENDOSCOPY;  Service:  Gastroenterology;  Laterality: N/A;   LEFT HEART CATH AND CORONARY ANGIOGRAPHY Left 02/16/2017   Procedure: LEFT HEART CATH AND CORONARY ANGIOGRAPHY;  Surgeon: Corey Skains, MD;  Location: [place] [facility];  Service: Cardiovascular;  Laterality: Left;   LEFT HEART CATH AND CORONARY ANGIOGRAPHY Left 12/24/2021   Procedure: LEFT HEART CATH AND CORONARY ANGIOGRAPHY;  Surgeon: Corey Skains, MD;  Location: [place] [facility];  Service: Cardiovascular;  Laterality: Left;   LEFT HEART CATH AND CORS/GRAFTS ANGIOGRAPHY Left 11/23/2017   Procedure: LEFT HEART CATH AND CORS/GRAFTS ANGIOGRAPHY;  Surgeon: Corey Skains, MD;  Location: [place] [facility];  Service: Cardiovascular;  Laterality: Left;   LEFT HEART CATH AND CORS/GRAFTS ANGIOGRAPHY N/A 02/13/2019   Procedure: LEFT HEART CATH AND CORS/GRAFTS ANGIOGRAPHY;  Surgeon: Corey Skains, MD;  Location: [place] [facility];  Service: Cardiovascular;  Laterality: N/A;   LEFT HEART CATH AND CORS/GRAFTS ANGIOGRAPHY N/A 07/03/2019   Procedure: LEFT HEART CATH AND CORS/GRAFTS ANGIOGRAPHY;  Surgeon: Corey Skains, MD;  Location: [place] [facility];  Service: Cardiovascular;  Laterality: N/A;   LEFT HEART CATH AND CORS/GRAFTS ANGIOGRAPHY N/A 11/04/2020   Procedure: LEFT HEART CATH AND CORS/GRAFTS ANGIOGRAPHY;  Surgeon: Corey Skains, MD;  Location: [place] [facility];  Service: Cardiovascular;  Laterality: N/A;    Medications Prior to Admission  Medication Sig Dispense Refill Last Dose   albuterol (VENTOLIN HFA) 108 (90 Base) MCG/ACT inhaler Inhale 2 puffs into the lungs every 6 (six) hours as needed for wheezing or shortness of breath. 8 g 6 Past Week at prn   amLODipine (NORVASC) 10 MG tablet Take 1 tablet by mouth daily.   04/05/2022   aspirin EC 81 MG tablet Take 81 mg by mouth daily.   04/05/2022   atorvastatin (LIPITOR) 80 MG tablet Take 1 tablet by mouth daily.   04/05/2022   canagliflozin (INVOKANA) 300 MG TABS tablet Take 1  tablet by mouth daily before breakfast.   04/05/2022   clopidogrel (PLAVIX) 75 MG tablet Take 1 tablet by mouth daily.   04/05/2022   cyclobenzaprine (FLEXERIL) 10 MG tablet Take 1 tablet by mouth at bedtime.   04/05/2022   dapagliflozin propanediol (FARXIGA) 10 MG TABS tablet Take 1 tablet (10 mg total) by mouth daily. 90 tablet 1 04/05/2022   empagliflozin (JARDIANCE) 25 MG TABS tablet Take 1 tablet by mouth daily.   04/05/2022   famotidine (PEPCID) 20 MG tablet Take 1 tablet by mouth 2 (two) times daily.   04/06/2022   Fluticasone-Umeclidin-Vilant (TRELEGY ELLIPTA) 100-62.5-25 MCG/ACT AEPB INHALE 1 PUFF INTO THE LUNGS ONCE DAILY   04/05/2022   furosemide (LASIX) 40 MG tablet Take 1 tablet (40 mg total) by mouth 2 (two) times daily. 180 tablet 1 04/05/2022   isosorbide mononitrate (IMDUR) 60 MG 24 hr tablet Take 1 tablet by mouth daily.   04/05/2022   losartan (COZAAR) 100 MG tablet Take 1 tablet by mouth daily.   04/05/2022   metoprolol succinate (TOPROL-XL) 25 MG 24 hr tablet Take 1 tablet by mouth daily.   04/05/2022   montelukast (SINGULAIR) 10 MG tablet Take 1 tablet by mouth at bedtime.   04/05/2022   nitroGLYCERIN (NITROSTAT) 0.4 MG SL tablet DISSOLVE ONE  TABLET UNDER THE TONGUE EVERY 5 MINUTES AS NEEDED FOR CHEST PAIN.  DO NOT EXCEED A TOTAL OF 3 DOSES IN 15 MINUTES 30 tablet 3 04/06/2022 at prn   Omeprazole 20 MG TBEC Take 20 mg by mouth 2 (two) times daily.   04/05/2022   oxybutynin (DITROPAN) 5 MG tablet Take 1 tablet by mouth daily.   04/05/2022   sertraline (ZOLOFT) 100 MG tablet Take 100 mg by mouth daily.   04/05/2022   zolpidem (AMBIEN) 5 MG tablet TAKE 1 TABLET BY MOUTH AT BEDTIME AS NEEDED FOR SLEEP 30 tablet 0 04/05/2022   Accu-Chek Softclix Lancets lancets Use as instructed twice a day to check blood sugars  DIAG -E11.59 100 each 3    doxycycline (VIBRA-TABS) 100 MG tablet Take 1 tablet by mouth daily. (Patient not taking: Reported on 04/06/2022)   Not Taking   Dulaglutide (TRULICITY) 1.5  PR/9.1MB SOPN INJECT 1.5MG INTO THE SKIN ONCE A WEEK 12 mL 1 04/03/2022   Ergocalciferol (VITAMIN D2) 10 MCG (400 UNIT) TABS Take 1 capsule by mouth once a week. (Patient not taking: Reported on 04/06/2022)   Not Taking   glucose blood (ACCU-CHEK GUIDE) test strip Use as instructed to check blood sugars twice a day  E11.59 100 each 3    loperamide (IMODIUM A-D) 2 MG tablet Take 2 tablets (4 mg total) by mouth in the morning, at noon, and at bedtime. 540 tablet 0 prn at prn   Misc. Devices (ADJUST BATH/SHOWER SEAT) MISC 1 shower seat for use at home to reduce risk of falling. 1 each 0    oxyCODONE-acetaminophen (PERCOCET/ROXICET) 5-325 MG tablet Take 1 tablet by mouth every 4 (four) hours as needed. (Patient not taking: Reported on 04/06/2022)   Not Taking   predniSONE (DELTASONE) 10 MG tablet Take one tab 3 x day for 3 days, then take one tab 2 x a day for 3 days and then take one tab a day for 3 days for copd (Patient not taking: Reported on 04/06/2022) 18 tablet 0 Not Taking    Social History   Socioeconomic History   Marital status: Widowed    Spouse name: Jeneen Rinks   Number of children: Not on file   Years of education: Not on file   Highest education level: Not on file  Occupational History   Not on file  Tobacco Use   Smoking status: Never   Smokeless tobacco: Never  Vaping Use   Vaping Use: Never used  Substance and Sexual Activity   Alcohol use: No    Alcohol/week: 0.0 standard drinks of alcohol   Drug use: No   Sexual activity: Yes  Other Topics Concern   Not on file  Social History Narrative   Not on file   Social Determinants of Health   Financial Resource Strain: Not on file  Food Insecurity: Not on file  Transportation Needs: Not on file  Physical Activity: Not on file  Stress: Not on file  Social Connections: Not on file  Intimate Partner Violence: Not on file    Family History  Problem Relation Age of Onset   Diabetes Mother    Diabetes Father    Cancer Father     Diabetes Brother      PHYSICAL EXAM General: Middle-aged black female, well nourished, in no acute distress.  Sitting upright in bed with lunch at bedside HEENT:  Normocephalic and atraumatic. Neck:   No JVD.  Lungs: Normal respiratory effort on room air.  Clear  to ascultation bilaterally. Heart: HRRR . Normal S1 and S2 without gallops or murmurs. Radial and DP pulses 2+ bilaterally. Abdomen: non-distended appearing.  Msk: Normal strength and tone for age. Extremities: No clubbing, cyanosis, edema.  Neuro: Alert and oriented x3 Psych:  mood appropriate for situation.    Labs:   Lab Results  Component Value Date   WBC 9.1 04/07/2022   HGB 12.4 04/07/2022   HCT 39.6 04/07/2022   MCV 79.7 (L) 04/07/2022   PLT 256 04/07/2022    Recent Labs  Lab 04/06/22 0954  NA 137  K 4.0  CL 103  CO2 26  BUN 15  CREATININE 0.62  CALCIUM 9.5  GLUCOSE 142*    Lab Results  Component Value Date   TROPONINI <0.03 12/13/2017    Lab Results  Component Value Date   CHOL 243 (H) 09/11/2021   CHOL 181 06/10/2021   CHOL 188 09/17/2019   Lab Results  Component Value Date   HDL 50 09/11/2021   HDL 32 (L) 06/10/2021   HDL 38 (L) 09/17/2019   Lab Results  Component Value Date   LDLCALC 181 (H) 09/11/2021   LDLCALC 125 (H) 06/10/2021   LDLCALC 126 (H) 09/17/2019   Lab Results  Component Value Date   TRIG 62 09/11/2021   TRIG 121 06/10/2021   TRIG 133 09/17/2019   Lab Results  Component Value Date   CHOLHDL 4.9 09/11/2021   CHOLHDL 5.7 06/10/2021   CHOLHDL 4.9 10/20/2015   No results found for: "LDLDIRECT"    Radiology: DG Chest 2 View  Result Date: 04/06/2022 CLINICAL DATA:  Chest pain EXAM: CHEST - 2 VIEW COMPARISON:  01/11/2022 FINDINGS: Mild cardiomegaly. Remote median sternotomy. Left retrocardiac opacity likely atelectasis. IMPRESSION: Left retrocardiac opacity likely atelectasis. Electronically Signed   By: Ulyses Jarred M.D.   On: 04/06/2022 03:30    EKG: Sinus  rhythm 64 bpm with right bundle branch block  Data reviewed by me: ED note, admission H&P, CBC, BMP, troponins, EKGs, telemetry, vitals  ASSESSMENT AND PLAN:   1.  Unstable angina versus NSTEMI, mildly elevated high-sensitivity troponin 26, 76, 93, 98, 170, 168, currently chest pain-free 2.  Status post CABG x 4, with patent LIMA to LAD, occluded SVG to PDA and D1, and status post DES distal SVG to OM 26/22/2023, with history of multiple coronary stents 3.  Chronic diastolic congestive heart failure, EF 50-55%, mild MR 05/2019 patient appears euvolemic 4.  Essential hypertension, blood pressure mildly elevated on current BP medications  Recommendations  1.  Agree with current therapy 2.  Continue heparin 48 hours, ending 10/5 at 11 AM 3.  Continue aspirin and Plavix 4.  Defer cardiac catheterization per patient's wishes and continue conservative, medical management 5.  Continue Ranexa 500 mg twice daily 6.  Continue cardiac rehab after discharge  This patient's plan of care was discussed and created with Dr. Saralyn Pilar and he is in agreement.    Signed: Alanson Puls Celes Dedic PA-C 04/07/2022, 12:29 PM

## 2022-04-08 ENCOUNTER — Telehealth (HOSPITAL_COMMUNITY): Payer: Self-pay | Admitting: Pharmacy Technician

## 2022-04-08 ENCOUNTER — Other Ambulatory Visit: Payer: Self-pay

## 2022-04-08 ENCOUNTER — Other Ambulatory Visit (HOSPITAL_COMMUNITY): Payer: Self-pay

## 2022-04-08 ENCOUNTER — Ambulatory Visit: Payer: Medicare Other | Admitting: Physical Therapy

## 2022-04-08 DIAGNOSIS — I214 Non-ST elevation (NSTEMI) myocardial infarction: Secondary | ICD-10-CM | POA: Diagnosis not present

## 2022-04-08 LAB — GLUCOSE, CAPILLARY
Glucose-Capillary: 149 mg/dL — ABNORMAL HIGH (ref 70–99)
Glucose-Capillary: 184 mg/dL — ABNORMAL HIGH (ref 70–99)

## 2022-04-08 LAB — CBC
HCT: 39.3 % (ref 36.0–46.0)
Hemoglobin: 12.7 g/dL (ref 12.0–15.0)
MCH: 25.5 pg — ABNORMAL LOW (ref 26.0–34.0)
MCHC: 32.3 g/dL (ref 30.0–36.0)
MCV: 78.9 fL — ABNORMAL LOW (ref 80.0–100.0)
Platelets: 271 10*3/uL (ref 150–400)
RBC: 4.98 MIL/uL (ref 3.87–5.11)
RDW: 17.3 % — ABNORMAL HIGH (ref 11.5–15.5)
WBC: 11.3 10*3/uL — ABNORMAL HIGH (ref 4.0–10.5)
nRBC: 0 % (ref 0.0–0.2)

## 2022-04-08 LAB — BASIC METABOLIC PANEL
Anion gap: 6 (ref 5–15)
BUN: 21 mg/dL (ref 8–23)
CO2: 29 mmol/L (ref 22–32)
Calcium: 9.2 mg/dL (ref 8.9–10.3)
Chloride: 99 mmol/L (ref 98–111)
Creatinine, Ser: 1.05 mg/dL — ABNORMAL HIGH (ref 0.44–1.00)
GFR, Estimated: 59 mL/min — ABNORMAL LOW (ref >60)
Glucose, Bld: 160 mg/dL — ABNORMAL HIGH (ref 70–99)
Potassium: 4 mmol/L (ref 3.5–5.1)
Sodium: 134 mmol/L — ABNORMAL LOW (ref 135–145)

## 2022-04-08 LAB — HEPARIN LEVEL (UNFRACTIONATED): Heparin Unfractionated: 0.49 IU/mL (ref 0.30–0.70)

## 2022-04-08 MED ORDER — RANOLAZINE ER 500 MG PO TB12
1000.0000 mg | ORAL_TABLET | Freq: Two times a day (BID) | ORAL | Status: DC
  Administered 2022-04-08: 1000 mg via ORAL
  Filled 2022-04-08: qty 2

## 2022-04-08 MED ORDER — FUROSEMIDE 40 MG PO TABS: 40.0000 mg | ORAL_TABLET | Freq: Every day | ORAL | Status: DC

## 2022-04-08 MED ORDER — ORAL CARE MOUTH RINSE: 15.0000 mL | OROMUCOSAL | Status: DC | PRN

## 2022-04-08 MED ORDER — RANOLAZINE ER 1000 MG PO TB12: 1000.0000 mg | ORAL_TABLET | Freq: Two times a day (BID) | ORAL | 1 refills | Status: DC

## 2022-04-08 NOTE — Discharge Instructions (Addendum)
Keep log of your sugars at home Holding your [place], [place] and [place] (all from same group) . D/w PCP regarding your Diabetes mnx

## 2022-04-08 NOTE — Progress Notes (Signed)
  Transition of Care (TOC) Screening Note   Patient Details  Name: Felicia Woods Date of Birth: 09-02-1955   Transition of Care [facility]) CM/SW Contact:    Alberteen Sam, LCSW Phone Number: 04/08/2022, 11:21 AM    Transition of Care Department [facility]) has reviewed patient and no TOC needs have been identified at this time. We will continue to monitor patient advancement through interdisciplinary progression rounds. If new patient transition needs arise, please place a TOC consult.  [place], [place]

## 2022-04-08 NOTE — Progress Notes (Signed)
[facility] Cardiology  CARDIOLOGY CONSULT NOTE  Patient ID: Felicia Woods MRN: [id number] DOB/AGE: 08-26-1955 66 y.o.  Admit date: 04/06/2022 Referring Physician [place] Primary Physician [place] Primary Cardiologist Nehemiah Massed Reason for Consultation unstable angina  HPI: 66 year old female referred for evaluation of elevated troponin consistent with unstable angina versus NSTEMI.  The patient has known coronary artery disease, status post CABG x4, and multiple coronary stents.  Most recent cardiac catheterization 12/24/2021 revealed patent LIMA to LAD, occluded SVG to PDA, occluded SVG to D1, patent SVG to OM 2 with high-grade stenosis in the distal segment and underwent PCI with drug-eluting stent.  The patient has undergone previous PCI 02/19/2017, 02/13/2019, 03/08/2019, and 11/04/2020.  The patient awoke early in the morning, developed 6 out of 10 chest discomfort, presented to [facility] ED.  ECG revealed sinus rhythm at 64 bpm with right bundle branch block without ischemic ST-T wave changes.  High-sensitivity troponin was mildly elevated 26 and 76.  Chest pain resolved and the patient is discharged and was to be seen by Dr. Nehemiah Massed in the office this morning.  However, after arriving at the clinic, the patient redeveloped chest pain and return to [facility] ED.  Interval history: -brief ~1-2 min episode of chest pain this morning with walking to the bathroom, self terminating, per patient not nearly as severe as prior to admission -no shortness of breath or other complaints, eager to go home  -heparin infusion complete at 11am today  -Echo resutled with preseved LVEF and g1 diastolic dysfunction without RWMAs  Review of systems complete and found to be negative unless listed above     Past Medical History:  Diagnosis Date   Asthma    Coronary artery disease    Diabetes mellitus without complication ([place])    Heart attack ([place])    Hyperlipidemia    Hypertension    MI (myocardial infarction) ([place])     Migraine headache with aura    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    Past Surgical History:  Procedure Laterality Date   ABDOMINAL HYSTERECTOMY     CARDIAC CATHETERIZATION     CARDIAC CATHETERIZATION N/A 10/18/2015   Procedure: Left Heart Cath and Cors/Grafts Angiography;  Surgeon: Lorretta Harp, MD;  Location: [place] CV LAB;  Service: Cardiovascular;  Laterality: N/A;   CARDIAC CATHETERIZATION N/A 10/18/2015   Procedure: Coronary Stent Intervention;  Surgeon: Lorretta Harp, MD;  Location: [place] CV LAB;  Service: Cardiovascular;  Laterality: N/A;   CARDIAC SURGERY     CHOLECYSTECTOMY     COLONOSCOPY WITH PROPOFOL N/A 12/02/2021   Procedure: COLONOSCOPY WITH PROPOFOL;  Surgeon: Jonathon Bellows, MD;  Location: [facility] ENDOSCOPY;  Service: Gastroenterology;  Laterality: N/A;   CORONARY ANGIOPLASTY     CORONARY ARTERY BYPASS GRAFT     4 vessels - 2010   CORONARY STENT INTERVENTION N/A 02/16/2017   Procedure: CORONARY STENT INTERVENTION;  Surgeon: Yolonda Kida, MD;  Location: [place] CV LAB;  Service: Cardiovascular;  Laterality: N/A;   CORONARY STENT INTERVENTION N/A 02/13/2019   Procedure: CORONARY STENT INTERVENTION;  Surgeon: Nelva Bush, MD;  Location: [place] CV LAB;  Service: Cardiovascular;  Laterality: N/A;  SVG to RCA   CORONARY STENT INTERVENTION Left 03/08/2019   Procedure: CORONARY STENT INTERVENTION;  Surgeon: Yolonda Kida, MD;  Location: [place] CV LAB;  Service: Cardiovascular;  Laterality: Left;   CORONARY STENT INTERVENTION N/A 11/04/2020   Procedure: CORONARY STENT INTERVENTION;  Surgeon: Nelva Bush, MD;  Location: [facility]  INVASIVE CV LAB;  Service: Cardiovascular;  Laterality: N/A;   CORONARY STENT INTERVENTION N/A 12/24/2021   Procedure: CORONARY STENT INTERVENTION;  Surgeon: Andrez Grime, MD;  Location: [place] CV LAB;  Service: Cardiovascular;  Laterality: N/A;   ESOPHAGOGASTRODUODENOSCOPY N/A 12/02/2021   Procedure:  ESOPHAGOGASTRODUODENOSCOPY (EGD);  Surgeon: Jonathon Bellows, MD;  Location: [facility] ENDOSCOPY;  Service: Gastroenterology;  Laterality: N/A;   LEFT HEART CATH AND CORONARY ANGIOGRAPHY Left 02/16/2017   Procedure: LEFT HEART CATH AND CORONARY ANGIOGRAPHY;  Surgeon: Corey Skains, MD;  Location: [place] CV LAB;  Service: Cardiovascular;  Laterality: Left;   LEFT HEART CATH AND CORONARY ANGIOGRAPHY Left 12/24/2021   Procedure: LEFT HEART CATH AND CORONARY ANGIOGRAPHY;  Surgeon: Corey Skains, MD;  Location: [place] CV LAB;  Service: Cardiovascular;  Laterality: Left;   LEFT HEART CATH AND CORS/GRAFTS ANGIOGRAPHY Left 11/23/2017   Procedure: LEFT HEART CATH AND CORS/GRAFTS ANGIOGRAPHY;  Surgeon: Corey Skains, MD;  Location: [place] CV LAB;  Service: Cardiovascular;  Laterality: Left;   LEFT HEART CATH AND CORS/GRAFTS ANGIOGRAPHY N/A 02/13/2019   Procedure: LEFT HEART CATH AND CORS/GRAFTS ANGIOGRAPHY;  Surgeon: Corey Skains, MD;  Location: [place] CV LAB;  Service: Cardiovascular;  Laterality: N/A;   LEFT HEART CATH AND CORS/GRAFTS ANGIOGRAPHY N/A 07/03/2019   Procedure: LEFT HEART CATH AND CORS/GRAFTS ANGIOGRAPHY;  Surgeon: Corey Skains, MD;  Location: [place] CV LAB;  Service: Cardiovascular;  Laterality: N/A;   LEFT HEART CATH AND CORS/GRAFTS ANGIOGRAPHY N/A 11/04/2020   Procedure: LEFT HEART CATH AND CORS/GRAFTS ANGIOGRAPHY;  Surgeon: Corey Skains, MD;  Location: [place] CV LAB;  Service: Cardiovascular;  Laterality: N/A;    Medications Prior to Admission  Medication Sig Dispense Refill Last Dose   albuterol (VENTOLIN HFA) 108 (90 Base) MCG/ACT inhaler Inhale 2 puffs into the lungs every 6 (six) hours as needed for wheezing or shortness of breath. 8 g 6 Past Week at prn   amLODipine (NORVASC) 10 MG tablet Take 1 tablet by mouth daily.   04/05/2022   aspirin EC 81 MG tablet Take 81 mg by mouth daily.   04/05/2022   atorvastatin (LIPITOR) 80 MG tablet  Take 1 tablet by mouth daily.   04/05/2022   canagliflozin (INVOKANA) 300 MG TABS tablet Take 1 tablet by mouth daily before breakfast.   04/05/2022   clopidogrel (PLAVIX) 75 MG tablet Take 1 tablet by mouth daily.   04/05/2022   cyclobenzaprine (FLEXERIL) 10 MG tablet Take 1 tablet by mouth at bedtime.   04/05/2022   dapagliflozin propanediol (FARXIGA) 10 MG TABS tablet Take 1 tablet (10 mg total) by mouth daily. 90 tablet 1 04/05/2022   empagliflozin (JARDIANCE) 25 MG TABS tablet Take 1 tablet by mouth daily.   04/05/2022   famotidine (PEPCID) 20 MG tablet Take 1 tablet by mouth 2 (two) times daily.   04/06/2022   Fluticasone-Umeclidin-Vilant (TRELEGY ELLIPTA) 100-62.5-25 MCG/ACT AEPB INHALE 1 PUFF INTO THE LUNGS ONCE DAILY   04/05/2022   furosemide (LASIX) 40 MG tablet Take 1 tablet (40 mg total) by mouth 2 (two) times daily. 180 tablet 1 04/05/2022   isosorbide mononitrate (IMDUR) 60 MG 24 hr tablet Take 1 tablet by mouth daily.   04/05/2022   losartan (COZAAR) 100 MG tablet Take 1 tablet by mouth daily.   04/05/2022   metoprolol succinate (TOPROL-XL) 25 MG 24 hr tablet Take 1 tablet by mouth daily.   04/05/2022   montelukast (SINGULAIR) 10 MG tablet Take  1 tablet by mouth at bedtime.   04/05/2022   nitroGLYCERIN (NITROSTAT) 0.4 MG SL tablet DISSOLVE ONE TABLET UNDER THE TONGUE EVERY 5 MINUTES AS NEEDED FOR CHEST PAIN.  DO NOT EXCEED A TOTAL OF 3 DOSES IN 15 MINUTES 30 tablet 3 04/06/2022 at prn   Omeprazole 20 MG TBEC Take 20 mg by mouth 2 (two) times daily.   04/05/2022   oxybutynin (DITROPAN) 5 MG tablet Take 1 tablet by mouth daily.   04/05/2022   sertraline (ZOLOFT) 100 MG tablet Take 100 mg by mouth daily.   04/05/2022   zolpidem (AMBIEN) 5 MG tablet TAKE 1 TABLET BY MOUTH AT BEDTIME AS NEEDED FOR SLEEP 30 tablet 0 04/05/2022   Accu-Chek Softclix Lancets lancets Use as instructed twice a day to check blood sugars  DIAG -E11.59 100 each 3    Dulaglutide (TRULICITY) 1.5 VX/7.9TJ SOPN INJECT 1.5MG INTO THE  SKIN ONCE A WEEK 12 mL 1 04/03/2022   Ergocalciferol (VITAMIN D2) 10 MCG (400 UNIT) TABS Take 1 capsule by mouth once a week. (Patient not taking: Reported on 04/06/2022)   Not Taking   glucose blood (ACCU-CHEK GUIDE) test strip Use as instructed to check blood sugars twice a day  E11.59 100 each 3    loperamide (IMODIUM A-D) 2 MG tablet Take 2 tablets (4 mg total) by mouth in the morning, at noon, and at bedtime. 540 tablet 0 prn at prn   Misc. Devices (ADJUST BATH/SHOWER SEAT) MISC 1 shower seat for use at home to reduce risk of falling. 1 each 0    oxyCODONE-acetaminophen (PERCOCET/ROXICET) 5-325 MG tablet Take 1 tablet by mouth every 4 (four) hours as needed. (Patient not taking: Reported on 04/06/2022)   Not Taking   predniSONE (DELTASONE) 10 MG tablet Take one tab 3 x day for 3 days, then take one tab 2 x a day for 3 days and then take one tab a day for 3 days for copd (Patient not taking: Reported on 04/06/2022) 18 tablet 0 Not Taking    Social History   Socioeconomic History   Marital status: Widowed    Spouse name: Jeneen Rinks   Number of children: Not on file   Years of education: Not on file   Highest education level: Not on file  Occupational History   Not on file  Tobacco Use   Smoking status: Never   Smokeless tobacco: Never  Vaping Use   Vaping Use: Never used  Substance and Sexual Activity   Alcohol use: No    Alcohol/week: 0.0 standard drinks of alcohol   Drug use: No   Sexual activity: Yes  Other Topics Concern   Not on file  Social History Narrative   Not on file   Social Determinants of Health   Financial Resource Strain: Not on file  Food Insecurity: Not on file  Transportation Needs: Not on file  Physical Activity: Not on file  Stress: Not on file  Social Connections: Not on file  Intimate Partner Violence: Not on file    Family History  Problem Relation Age of Onset   Diabetes Mother    Diabetes Father    Cancer Father    Diabetes Brother      PHYSICAL  EXAM General: Middle-aged black female, well nourished, in no acute distress.  Sitting upright in recliner HEENT:  Normocephalic and atraumatic. Neck:   No JVD.  Lungs: Normal respiratory effort on room air.  Clear to ascultation bilaterally. Heart: HRRR . Normal S1  and S2 without gallops or murmurs. Radial and DP pulses 2+ bilaterally. Abdomen: non-distended appearing.  Msk: Normal strength and tone for age. Extremities: No clubbing, cyanosis, edema.  Neuro: Alert and oriented x3 Psych:  mood appropriate for situation.    Labs:   Lab Results  Component Value Date   WBC 11.3 (H) 04/08/2022   HGB 12.7 04/08/2022   HCT 39.3 04/08/2022   MCV 78.9 (L) 04/08/2022   PLT 271 04/08/2022    Recent Labs  Lab 04/08/22 0530  NA 134*  K 4.0  CL 99  CO2 29  BUN 21  CREATININE 1.05*  CALCIUM 9.2  GLUCOSE 160*    Lab Results  Component Value Date   TROPONINI <0.03 12/13/2017    Lab Results  Component Value Date   CHOL 243 (H) 09/11/2021   CHOL 181 06/10/2021   CHOL 188 09/17/2019   Lab Results  Component Value Date   HDL 50 09/11/2021   HDL 32 (L) 06/10/2021   HDL 38 (L) 09/17/2019   Lab Results  Component Value Date   LDLCALC 181 (H) 09/11/2021   LDLCALC 125 (H) 06/10/2021   LDLCALC 126 (H) 09/17/2019   Lab Results  Component Value Date   TRIG 62 09/11/2021   TRIG 121 06/10/2021   TRIG 133 09/17/2019   Lab Results  Component Value Date   CHOLHDL 4.9 09/11/2021   CHOLHDL 5.7 06/10/2021   CHOLHDL 4.9 10/20/2015   No results found for: "LDLDIRECT"    Radiology: ECHOCARDIOGRAM COMPLETE  Result Date: 04/07/2022    ECHOCARDIOGRAM REPORT   Patient Name:   Felicia Woods Date of Exam: 04/07/2022 Medical Rec #:  [id number]      Height:       67.0 in Accession #:    [id number]     Weight:       231.5 lb Date of Birth:  July 08, 1955     BSA:          2.152 m Patient Age:    72 years       BP:           138/97 mmHg Patient Gender: F              HR:           73 bpm. Exam  Location:  [facility] Procedure: 2D Echo, Color Doppler, Cardiac Doppler and Intracardiac            Opacification Agent Indications:     I21.4 NSTEMI  History:         Patient has prior history of Echocardiogram examinations, most                  recent 05/31/2019. CAD, Prior CABG; Risk Factors:Hypertension,                  Diabetes and Dyslipidemia.  Sonographer:     Charmayne Sheer Referring Phys:  [id number] AGBATA Diagnosing Phys: Isaias Cowman MD  Sonographer Comments: Suboptimal apical window. IMPRESSIONS  1. Left ventricular ejection fraction, by estimation, is 55 to 60%. The left ventricle has normal function. The left ventricle has no regional wall motion abnormalities. Left ventricular diastolic parameters are consistent with Grade I diastolic dysfunction (impaired relaxation).  2. Right ventricular systolic function is normal. The right ventricular size is normal.  3. The mitral valve is normal in structure. Mild mitral valve regurgitation. No evidence of mitral stenosis.  4. The aortic valve is normal in structure. Aortic  valve regurgitation is not visualized. No aortic stenosis is present.  5. The inferior vena cava is normal in size with greater than 50% respiratory variability, suggesting right atrial pressure of 3 mmHg. FINDINGS  Left Ventricle: Left ventricular ejection fraction, by estimation, is 55 to 60%. The left ventricle has normal function. The left ventricle has no regional wall motion abnormalities. Definity contrast agent was given IV to delineate the left ventricular  endocardial borders. The left ventricular internal cavity size was normal in size. There is no left ventricular hypertrophy. Left ventricular diastolic parameters are consistent with Grade I diastolic dysfunction (impaired relaxation). Right Ventricle: The right ventricular size is normal. No increase in right ventricular wall thickness. Right ventricular systolic function is normal. Left Atrium: Left atrial size was  normal in size. Right Atrium: Right atrial size was normal in size. Pericardium: There is no evidence of pericardial effusion. Mitral Valve: The mitral valve is normal in structure. Mild mitral valve regurgitation. No evidence of mitral valve stenosis. Tricuspid Valve: The tricuspid valve is normal in structure. Tricuspid valve regurgitation is mild . No evidence of tricuspid stenosis. Aortic Valve: The aortic valve is normal in structure. Aortic valve regurgitation is not visualized. No aortic stenosis is present. Aortic valve mean gradient measures 4.0 mmHg. Aortic valve peak gradient measures 6.8 mmHg. Aortic valve area, by VTI measures 2.35 cm. Pulmonic Valve: The pulmonic valve was normal in structure. Pulmonic valve regurgitation is not visualized. No evidence of pulmonic stenosis. Aorta: The aortic root is normal in size and structure. Venous: The inferior vena cava is normal in size with greater than 50% respiratory variability, suggesting right atrial pressure of 3 mmHg. IAS/Shunts: No atrial level shunt detected by color flow Doppler.  LEFT VENTRICLE PLAX 2D LVIDd:         4.20 cm   Diastology LVIDs:         2.90 cm   LV e' medial:    9.36 cm/s LV PW:         1.00 cm   LV E/e' medial:  8.7 LV IVS:        1.00 cm   LV e' lateral:   13.70 cm/s LVOT diam:     1.90 cm   LV E/e' lateral: 6.0 LV SV:         59 LV SV Index:   27 LVOT Area:     2.84 cm  RIGHT VENTRICLE RV Basal diam:  2.70 cm RV S prime:     5.77 cm/s LEFT ATRIUM           Index        RIGHT ATRIUM           Index LA diam:      3.70 cm 1.72 cm/m   RA Area:     12.00 cm LA Vol (A4C): 26.7 ml 12.41 ml/m  RA Volume:   24.30 ml  11.29 ml/m  AORTIC VALVE                    PULMONIC VALVE AV Area (Vmax):    2.33 cm     PV Vmax:       0.91 m/s AV Area (Vmean):   2.22 cm     PV Peak grad:  3.3 mmHg AV Area (VTI):     2.35 cm AV Vmax:           130.00 cm/s AV Vmean:  89.000 cm/s AV VTI:            0.251 m AV Peak Grad:      6.8 mmHg AV  Mean Grad:      4.0 mmHg LVOT Vmax:         107.00 cm/s LVOT Vmean:        69.700 cm/s LVOT VTI:          0.208 m LVOT/AV VTI ratio: 0.83  AORTA Ao Root diam: 2.90 cm MITRAL VALVE MV Area (PHT): 4.17 cm     SHUNTS MV Decel Time: 182 msec     Systemic VTI:  0.21 m MV E velocity: 81.80 cm/s   Systemic Diam: 1.90 cm MV A velocity: 101.00 cm/s MV E/A ratio:  0.81 Isaias Cowman MD Electronically signed by Isaias Cowman MD Signature Date/Time: 04/07/2022/1:17:12 PM    Final    DG Chest 2 View  Result Date: 04/06/2022 CLINICAL DATA:  Chest pain EXAM: CHEST - 2 VIEW COMPARISON:  01/11/2022 FINDINGS: Mild cardiomegaly. Remote median sternotomy. Left retrocardiac opacity likely atelectasis. IMPRESSION: Left retrocardiac opacity likely atelectasis. Electronically Signed   By: Ulyses Jarred M.D.   On: 04/06/2022 03:30    EKG: Sinus rhythm 64 bpm with right bundle branch block  Telemetry: NSR 70-90  Data reviewed by me: hospitalist progress ntoe, cbc, bmp,  I/O, telemetry, vitals,   ASSESSMENT AND PLAN:   1.  Unstable angina versus NSTEMI, mildly elevated high-sensitivity troponin 26, 76, 93, 98, 170, 168, currently chest pain-free 2.  Status post CABG x 4, with patent LIMA to LAD, occluded SVG to PDA and D1, and status post DES distal SVG to OM 26/22/2023, with history of multiple coronary stents 3.  Chronic diastolic congestive heart failure, EF 50-55%, mild MR 05/2019 patient appears euvolemic 4.  Essential hypertension, blood pressure mildly elevated on current BP medications  Recommendations  1.  Agree with current therapy 2.  Will complete 48 hours of  heparin at 11 AM today 3.  Continue aspirin and Plavix 4.  Continue to defer cardiac catheterization per patient's wishes and continue conservative, medical management 5.  increase Ranexa to 1000 mg twice daily 6.  Continue cardiac rehab after discharge 7.  Ok for discharge today from a cardiac standpoint, follow up with Dr. Nehemiah Massed in  1-2 weeks.   This patient's plan of care was discussed and created with Dr. Saralyn Pilar and he is in agreement.    Signed: Alanson Puls Preston Garabedian PA-C 04/08/2022, 9:08 AM

## 2022-04-08 NOTE — Telephone Encounter (Signed)
Pharmacy Patient Advocate Encounter  Insurance verification completed.    The patient is insured through [facility]   The patient is currently admitted and ran test claims for the following: Farxiga.  Copays and coinsurance results were relayed to Inpatient clinical team.

## 2022-04-08 NOTE — TOC Benefit Eligibility Note (Signed)
Patient Teacher, English as a foreign language completed.    The patient is currently admitted and upon discharge could be taking Farxiga 10 mg.  The current 30 day co-pay is $0.00.   The patient is insured through [place] Patient Advocate Specialist [place] Patient Advocate Team Direct Number: [phone]  Fax: [phone]

## 2022-04-08 NOTE — Progress Notes (Signed)
ANTICOAGULATION CONSULT NOTE  Pharmacy Consult for IV heparin Indication: chest pain/ACS   Patient Measurements: Height: '5\' 7"'  (170.2 cm) Weight: 105 kg (231 lb 7.7 oz) IBW/kg (Calculated) : 61.6 Heparin Dosing Weight: 85.4 kg   Labs: Recent Labs    04/06/22 0240 04/06/22 0308 04/06/22 0954 04/06/22 1251 04/06/22 1656 04/06/22 2336 04/07/22 0504 04/08/22 0530  HGB  --    < > 13.9  --   --   --  12.4 12.7  HCT  --    < > 44.0  --   --   --  39.6 39.3  PLT  --    < > 250  --   --   --  256 271  APTT  --   --  32  --   --   --   --   --   LABPROT  --   --  13.6  --   --   --   --   --   INR  --   --  1.1  --   --   --   --   --   HEPARINUNFRC  --   --   --    < > 0.39 0.48 0.40 0.49  CREATININE 0.81  --  0.62  --   --   --   --   --   TROPONINIHS 10   < > 76*   < > 98* 170* 168*  --    < > = values in this interval not displayed.     Estimated Creatinine Clearance: 88.6 mL/min (by C-G formula based on SCr of 0.62 mg/dL).   Medical History: Past Medical History:  Diagnosis Date   Asthma    Coronary artery disease    Diabetes mellitus without complication ([place])    Heart attack ([place])    Hyperlipidemia    Hypertension    MI (myocardial infarction) ([place])    Migraine headache with aura    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    Medications:  Based on review patient is not on any anticoagulants at home.  Assessment: Pt presents with left sided CP.  Hx CAD status post CABG and multiple stents.  Baseline aPTT 32s and INR 1.1.  CBC acceptable for starting heparin.  Goal of Therapy:  Heparin level 0.3-0.7 units/ml Monitor platelets by anticoagulation protocol: Yes   Plan:  --Heparin level is therapeutic x 4 --Continue heparin infusion at current rate of 1100 units/hr --Re-check HL daily w/ AM labs while therapeutic --Daily CBC per protocol  Renda Rolls, PharmD, [facility] 04/08/2022 6:24 AM

## 2022-04-08 NOTE — Discharge Summary (Signed)
Physician Discharge Summary   Patient: Felicia Woods MRN: [id number] DOB: [birth year]  Admit date:     04/06/2022  Discharge date: 04/08/22  Discharge Physician: Fritzi Mandes   PCP: Mylinda Latina, PA-C   Recommendations at discharge:    Keep log of your sugars at home Holding your [place], Trulicity and [place] (all from same group) . D/w PCP regarding your Diabetes mnx F/u Dr Nehemiah Massed in 1 week F/u PCP in 1 week  Discharge Diagnoses: Principal Problem:   NSTEMI (non-ST elevated myocardial infarction) [facility]) Active Problems:   Diabetes mellitus ([place])   Essential hypertension   CAD (coronary artery disease)   Obesity (BMI 30-39.9)   Depression   Chronic diastolic CHF (congestive heart failure) [facility])   Hospital Course:  Felicia Woods is a 66 y.o. female with medical history significant for hypertension, diabetes mellitus, obesity, coronary artery disease status post PCI with stent angioplasty, status post CABG who presented to the ER in the early hours of the morning for evaluation of left-sided chest pain which woke her up from sleep rated a 6 x 10 in intensity at its worst with radiation to her left arm. Patient noted to have an uptrending troponin level and was started on a heparin drip.   NSTEMI (non-ST elevated myocardial infarction) (HCC) Chronic Angina --Patient with a known history of coronary artery disease status post CABG status post PCI with stent angioplasty who presents for evaluation of chest pain associated with shortness of breath and has an uptrending troponin level. --Chest pain resolved following administration of nitroglycerin. --Continue aspirin, Plavix, atorvastatin, metoprolol, nitrates and heparin drip initiated in the ER for 48 hours --enzymes flat --Cont medical mnx per dr Josefa Half. Cath deferred per pt wishes Added Ranexa   Chronic diastolic CHF (congestive heart failure) (HCC) --Stable and not acutely exacerbated  --Last known LVEF of 50 to  55% from a 2D echocardiogram which was done in 2020 --Continue metoprolol, furosemide and losartan   Depression --Continue sertraline   Obesity (BMI 30-39.9) --BMI 36 --Complicates overall prognosis and care --Life modification and exercise has been discussed with patient   CAD (coronary artery disease) --Treatment as outlined in 1   Essential hypertension --Continue metoprolol, losartan, nitrates and amlodipine   Diabetes mellitus (HCC) --on trulicity, jardiance, invokana and farxiga at home (all from same group) Maintain consistent carbohydrate diet Glycemic control with sliding scale insulin --will resume farxiga at d/c--d/w pharmacy --last A1c in June 23 was 7.2% --pt advised to discuss DM mnx with PCP       Family communication :none Consults : [place] [facility] CODE STATUS: full DVT Prophylaxis :heparin gtt Level of care: Progressive      CDisposition: Home Diet recommendation:  Discharge Diet Orders (From admission, onward)     Start     Ordered   04/08/22 0000  Diet - low sodium heart healthy        04/08/22 1045   04/08/22 0000  Diet Carb Modified        04/08/22 1045           Cardiac and Carb modified diet DISCHARGE MEDICATION: Allergies as of 04/08/2022       Reactions   Penicillin G Anaphylaxis, Other (See Comments)   Has patient had a PCN reaction causing immediate rash, facial/tongue/throat swelling, SOB or lightheadedness with hypotension: [id number]} Has patient had a PCN reaction causing severe rash involving mucus membranes or skin necrosis: no:[id number]} Has patient had a PCN reaction that required hospitalization no:[id number]} Has patient  had a PCN reaction occurring within the last 10 years: [id number]} If all of the above answers are "NO", then may proceed with Cephalosporin use.   Penicillins    Other reaction(s): Not available        Medication List     STOP taking these medications    Invokana 300 MG Tabs tablet Generic  drug: canagliflozin   Jardiance 25 MG Tabs tablet Generic drug: empagliflozin   loperamide 2 MG tablet Commonly known as: IMODIUM A-D   oxyCODONE-acetaminophen 5-325 MG tablet Commonly known as: PERCOCET/ROXICET   predniSONE 10 MG tablet Commonly known as: DELTASONE   Trulicity 1.5 WT/8.8EK Sopn Generic drug: Dulaglutide   Vitamin D2 10 MCG (400 UNIT) Tabs       TAKE these medications    Accu-Chek Guide test strip Generic drug: glucose blood Use as instructed to check blood sugars twice a day  E11.59   Accu-Chek Softclix Lancets lancets Use as instructed twice a day to check blood sugars  DIAG -E11.59   Adjust Bath/Shower Seat Misc 1 shower seat for use at home to reduce risk of falling.   albuterol 108 (90 Base) MCG/ACT inhaler Commonly known as: VENTOLIN HFA Inhale 2 puffs into the lungs every 6 (six) hours as needed for wheezing or shortness of breath.   amLODipine 10 MG tablet Commonly known as: NORVASC Take 1 tablet by mouth daily.   aspirin EC 81 MG tablet Take 81 mg by mouth daily.   atorvastatin 80 MG tablet Commonly known as: LIPITOR Take 1 tablet by mouth daily.   clopidogrel 75 MG tablet Commonly known as: PLAVIX Take 1 tablet by mouth daily.   cyclobenzaprine 10 MG tablet Commonly known as: FLEXERIL Take 1 tablet by mouth at bedtime.   dapagliflozin propanediol 10 MG Tabs tablet Commonly known as: Farxiga Take 1 tablet (10 mg total) by mouth daily.   famotidine 20 MG tablet Commonly known as: PEPCID Take 1 tablet by mouth 2 (two) times daily.   furosemide 40 MG tablet Commonly known as: LASIX Take 1 tablet (40 mg total) by mouth 2 (two) times daily.   isosorbide mononitrate 60 MG 24 hr tablet Commonly known as: IMDUR Take 1 tablet by mouth daily.   losartan 100 MG tablet Commonly known as: COZAAR Take 1 tablet by mouth daily.   metoprolol succinate 25 MG 24 hr tablet Commonly known as: TOPROL-XL Take 1 tablet by mouth daily.    montelukast 10 MG tablet Commonly known as: SINGULAIR Take 1 tablet by mouth at bedtime.   nitroGLYCERIN 0.4 MG SL tablet Commonly known as: NITROSTAT DISSOLVE ONE TABLET UNDER THE TONGUE EVERY 5 MINUTES AS NEEDED FOR CHEST PAIN.  DO NOT EXCEED A TOTAL OF 3 DOSES IN 15 MINUTES   Omeprazole 20 MG Tbec Take 20 mg by mouth 2 (two) times daily.   oxybutynin 5 MG tablet Commonly known as: DITROPAN Take 1 tablet by mouth daily.   ranolazine 1000 MG SR tablet Commonly known as: RANEXA Take 1 tablet (1,000 mg total) by mouth 2 (two) times daily.   sertraline 100 MG tablet Commonly known as: ZOLOFT Take 100 mg by mouth daily.   Trelegy Ellipta 100-62.5-25 MCG/ACT Aepb Generic drug: Fluticasone-Umeclidin-Vilant INHALE 1 PUFF INTO THE LUNGS ONCE DAILY   zolpidem 5 MG tablet Commonly known as: AMBIEN TAKE 1 TABLET BY MOUTH AT BEDTIME AS NEEDED FOR SLEEP        Follow-up Information     Corey Skains, MD. Go in  1 week(s).   Specialty: Cardiology Contact information: [address] [facility] [place] [address] [phone]         Mylinda Latina, PA-C. Schedule an appointment as soon as possible for a visit in 1 week(s).   Specialty: Physician Assistant Why: hosp f/u Contact information: [place]  [address] [phone]                Discharge Exam: Danley Danker Weights   04/06/22 0909  Weight: 105 kg     Condition at discharge: fair  The results of significant diagnostics from this hospitalization (including imaging, microbiology, ancillary and laboratory) are listed below for reference.   Imaging Studies: ECHOCARDIOGRAM COMPLETE  Result Date: 04/07/2022    ECHOCARDIOGRAM REPORT   Patient Name:   MILANIA HAUBNER Date of Exam: 04/07/2022 Medical Rec #:  [id number]      Height:       67.0 in Accession #:    [id number]     Weight:       231.5 lb Date of Birth:  1956/05/06     BSA:          2.152 m Patient  Age:    65 years       BP:           138/97 mmHg Patient Gender: F              HR:           73 bpm. Exam Location:  [facility] Procedure: 2D Echo, Color Doppler, Cardiac Doppler and Intracardiac            Opacification Agent Indications:     I21.4 NSTEMI  History:         Patient has prior history of Echocardiogram examinations, most                  recent 05/31/2019. CAD, Prior CABG; Risk Factors:Hypertension,                  Diabetes and Dyslipidemia.  Sonographer:     Charmayne Sheer Referring Phys:  [id number] AGBATA Diagnosing Phys: Isaias Cowman MD  Sonographer Comments: Suboptimal apical window. IMPRESSIONS  1. Left ventricular ejection fraction, by estimation, is 55 to 60%. The left ventricle has normal function. The left ventricle has no regional wall motion abnormalities. Left ventricular diastolic parameters are consistent with Grade I diastolic dysfunction (impaired relaxation).  2. Right ventricular systolic function is normal. The right ventricular size is normal.  3. The mitral valve is normal in structure. Mild mitral valve regurgitation. No evidence of mitral stenosis.  4. The aortic valve is normal in structure. Aortic valve regurgitation is not visualized. No aortic stenosis is present.  5. The inferior vena cava is normal in size with greater than 50% respiratory variability, suggesting right atrial pressure of 3 mmHg. FINDINGS  Left Ventricle: Left ventricular ejection fraction, by estimation, is 55 to 60%. The left ventricle has normal function. The left ventricle has no regional wall motion abnormalities. Definity contrast agent was given IV to delineate the left ventricular  endocardial borders. The left ventricular internal cavity size was normal in size. There is no left ventricular hypertrophy. Left ventricular diastolic parameters are consistent with Grade I diastolic dysfunction (impaired relaxation). Right Ventricle: The right ventricular size is normal. No increase in right  ventricular wall thickness. Right ventricular systolic function is normal. Left Atrium: Left atrial size was normal in size. Right Atrium: Right  atrial size was normal in size. Pericardium: There is no evidence of pericardial effusion. Mitral Valve: The mitral valve is normal in structure. Mild mitral valve regurgitation. No evidence of mitral valve stenosis. Tricuspid Valve: The tricuspid valve is normal in structure. Tricuspid valve regurgitation is mild . No evidence of tricuspid stenosis. Aortic Valve: The aortic valve is normal in structure. Aortic valve regurgitation is not visualized. No aortic stenosis is present. Aortic valve mean gradient measures 4.0 mmHg. Aortic valve peak gradient measures 6.8 mmHg. Aortic valve area, by VTI measures 2.35 cm. Pulmonic Valve: The pulmonic valve was normal in structure. Pulmonic valve regurgitation is not visualized. No evidence of pulmonic stenosis. Aorta: The aortic root is normal in size and structure. Venous: The inferior vena cava is normal in size with greater than 50% respiratory variability, suggesting right atrial pressure of 3 mmHg. IAS/Shunts: No atrial level shunt detected by color flow Doppler.  LEFT VENTRICLE PLAX 2D LVIDd:         4.20 cm   Diastology LVIDs:         2.90 cm   LV e' medial:    9.36 cm/s LV PW:         1.00 cm   LV E/e' medial:  8.7 LV IVS:        1.00 cm   LV e' lateral:   13.70 cm/s LVOT diam:     1.90 cm   LV E/e' lateral: 6.0 LV SV:         59 LV SV Index:   27 LVOT Area:     2.84 cm  RIGHT VENTRICLE RV Basal diam:  2.70 cm RV S prime:     5.77 cm/s LEFT ATRIUM           Index        RIGHT ATRIUM           Index LA diam:      3.70 cm 1.72 cm/m   RA Area:     12.00 cm LA Vol (A4C): 26.7 ml 12.41 ml/m  RA Volume:   24.30 ml  11.29 ml/m  AORTIC VALVE                    PULMONIC VALVE AV Area (Vmax):    2.33 cm     PV Vmax:       0.91 m/s AV Area (Vmean):   2.22 cm     PV Peak grad:  3.3 mmHg AV Area (VTI):     2.35 cm AV Vmax:            130.00 cm/s AV Vmean:          89.000 cm/s AV VTI:            0.251 m AV Peak Grad:      6.8 mmHg AV Mean Grad:      4.0 mmHg LVOT Vmax:         107.00 cm/s LVOT Vmean:        69.700 cm/s LVOT VTI:          0.208 m LVOT/AV VTI ratio: 0.83  AORTA Ao Root diam: 2.90 cm MITRAL VALVE MV Area (PHT): 4.17 cm     SHUNTS MV Decel Time: 182 msec     Systemic VTI:  0.21 m MV E velocity: 81.80 cm/s   Systemic Diam: 1.90 cm MV A velocity: 101.00 cm/s MV E/A ratio:  0.81 Isaias Cowman MD Electronically signed by Sheppard Coil  Paraschos MD Signature Date/Time: 04/07/2022/1:17:12 PM    Final    DG Chest 2 View  Result Date: 04/06/2022 CLINICAL DATA:  Chest pain EXAM: CHEST - 2 VIEW COMPARISON:  01/11/2022 FINDINGS: Mild cardiomegaly. Remote median sternotomy. Left retrocardiac opacity likely atelectasis. IMPRESSION: Left retrocardiac opacity likely atelectasis. Electronically Signed   By: Ulyses Jarred M.D.   On: 04/06/2022 03:30    Microbiology: Results for orders placed or performed in visit on 09/03/21  Microscopic Examination     Status: None   Collection Time: 09/03/21  3:45 PM   Urine  Result Value Ref Range Status   WBC, UA None seen 0 - 5 /hpf Final   RBC, Urine None seen 0 - 2 /hpf Final   Epithelial Cells (non renal) 0-10 0 - 10 /hpf Final   Casts None seen None seen /lpf Final   Bacteria, UA None seen None seen/Few Final    Labs: CBC: Recent Labs  Lab 04/06/22 0308 04/06/22 0954 04/07/22 0504 04/08/22 0530  WBC 8.3 7.9 9.1 11.3*  NEUTROABS 6.0 5.4  --   --   HGB 12.1 13.9 12.4 12.7  HCT 37.9 44.0 39.6 39.3  MCV 78.6* 79.4* 79.7* 78.9*  PLT 230 250 256 250   Basic Metabolic Panel: Recent Labs  Lab 04/06/22 0240 04/06/22 0954 04/08/22 0530  NA 137 137 134*  K 3.6 4.0 4.0  CL 103 103 99  CO2 '26 26 29  '$ GLUCOSE 184* 142* 160*  BUN '15 15 21  '$ CREATININE 0.81 0.62 1.05*  CALCIUM 9.1 9.5 9.2   Liver Function Tests: No results for input(s): "AST", "ALT", "ALKPHOS",  "BILITOT", "PROT", "ALBUMIN" in the last 168 hours. CBG: Recent Labs  Lab 04/07/22 0710 04/07/22 1339 04/07/22 1634 04/07/22 2000 04/08/22 0744  GLUCAP 258* 191* 154* 156* 149*    Discharge time spent: greater than 30 minutes.  Signed: Fritzi Mandes, MD [facility] 04/08/2022

## 2022-04-13 ENCOUNTER — Ambulatory Visit: Payer: Medicare Other

## 2022-04-13 ENCOUNTER — Inpatient Hospital Stay: Payer: Medicare Other | Admitting: Internal Medicine

## 2022-04-14 ENCOUNTER — Encounter: Payer: Medicare (Managed Care) | Admitting: Physical Therapy

## 2022-04-15 ENCOUNTER — Encounter: Payer: Self-pay | Admitting: *Deleted

## 2022-04-15 ENCOUNTER — Telehealth: Payer: Self-pay | Admitting: *Deleted

## 2022-04-15 ENCOUNTER — Telehealth: Payer: Self-pay | Admitting: Physical Therapy

## 2022-04-15 ENCOUNTER — Ambulatory Visit: Payer: Medicare Other | Attending: Gastroenterology | Admitting: Physical Therapy

## 2022-04-15 DIAGNOSIS — Z955 Presence of coronary angioplasty implant and graft: Secondary | ICD-10-CM

## 2022-04-15 NOTE — Telephone Encounter (Signed)
Patient was contacted regarding missed evaluation appt.  Asked to call office back if they wish to reschedule the evaluation.

## 2022-04-15 NOTE — Telephone Encounter (Signed)
Felicia Woods called to let us know that she does not have a follow up appt until 11/22 from her recent hospitalization for MI.  She would like to finish the program.

## 2022-04-19 ENCOUNTER — Ambulatory Visit: Payer: Medicare Other | Admitting: Internal Medicine

## 2022-04-20 ENCOUNTER — Encounter: Payer: Self-pay | Admitting: Internal Medicine

## 2022-04-20 ENCOUNTER — Ambulatory Visit (INDEPENDENT_AMBULATORY_CARE_PROVIDER_SITE_OTHER): Payer: Medicare Other | Admitting: Internal Medicine

## 2022-04-20 VITALS — BP 151/80 | HR 84 | Temp 98.2°F | Resp 16 | Ht 67.0 in | Wt 241.8 lb

## 2022-04-20 DIAGNOSIS — I10 Essential (primary) hypertension: Secondary | ICD-10-CM

## 2022-04-20 DIAGNOSIS — E1159 Type 2 diabetes mellitus with other circulatory complications: Secondary | ICD-10-CM | POA: Diagnosis not present

## 2022-04-20 DIAGNOSIS — I25708 Atherosclerosis of coronary artery bypass graft(s), unspecified, with other forms of angina pectoris: Secondary | ICD-10-CM

## 2022-04-20 DIAGNOSIS — I700 Atherosclerosis of aorta: Secondary | ICD-10-CM

## 2022-04-20 DIAGNOSIS — E782 Mixed hyperlipidemia: Secondary | ICD-10-CM

## 2022-04-20 DIAGNOSIS — G4709 Other insomnia: Secondary | ICD-10-CM

## 2022-04-20 LAB — POCT GLYCOSYLATED HEMOGLOBIN (HGB A1C): Hemoglobin A1C: 8.3 % — AB (ref 4.0–5.6)

## 2022-04-20 LAB — GLUCOSE, POCT (MANUAL RESULT ENTRY): POC Glucose: 227 mg/dl — AB (ref 70–99)

## 2022-04-20 MED ORDER — ZOLPIDEM TARTRATE 5 MG PO TABS: 5.0000 mg | ORAL_TABLET | Freq: Every evening | ORAL | 0 refills | Status: DC | PRN

## 2022-04-20 MED ORDER — TRULICITY 1.5 MG/0.5ML ~~LOC~~ SOAJ: 1.5000 mg | SUBCUTANEOUS | 1 refills | Status: DC

## 2022-04-20 MED ORDER — ATORVASTATIN CALCIUM 40 MG PO TABS: 40.0000 mg | ORAL_TABLET | Freq: Every day | ORAL | 3 refills | Status: DC

## 2022-04-20 MED ORDER — EMPAGLIFLOZIN 25 MG PO TABS: ORAL_TABLET | ORAL | 3 refills | Status: DC

## 2022-04-20 MED ORDER — METOPROLOL SUCCINATE ER 25 MG PO TB24: 25.0000 mg | ORAL_TABLET | Freq: Every day | ORAL | 1 refills | Status: DC

## 2022-04-20 MED ORDER — ISOSORBIDE MONONITRATE ER 60 MG PO TB24: ORAL_TABLET | ORAL | 1 refills | Status: DC

## 2022-04-20 NOTE — Progress Notes (Signed)
[facility] [place], [place] [address]  Internal MEDICINE  Office Visit Note  Patient Name: Felicia Woods  [id number]  [id number]  Date of Service: 04/20/2022     Chief Complaint  Patient presents with   Follow-up   Diabetes   Hypertension   Hyperlipidemia   Quality Metric Gaps    Dexa Scan     HPI Pt is here for recent hospital follow up.  Patient with a known history of coronary artery disease status post CABG status post PCI with stent angioplasty who went to ED with chest pain associated with shortness of breath and has an uptrending troponin level, Chest pain resolved following administration of nitroglycerin. Pt was admitted. Discharged diagnosis is NSTEMI. Chronic CHF is table as well  Multiple duplicate medications were found with her including of diabetes and HTN She admits to not taking certain medications including Zoloft  She said she has problem getting refills from the pharmacy  Current Medication: Outpatient Encounter Medications as of 04/20/2022  Medication Sig   Accu-Chek Softclix Lancets lancets Use as instructed twice a day to check blood sugars  DIAG -E11.59   albuterol (VENTOLIN HFA) 108 (90 Base) MCG/ACT inhaler Inhale 2 puffs into the lungs every 6 (six) hours as needed for wheezing or shortness of breath.   amLODipine (NORVASC) 10 MG tablet Take 1 tablet by mouth daily.   aspirin EC 81 MG tablet Take 81 mg by mouth daily.   atorvastatin (LIPITOR) 80 MG tablet Take 1 tablet by mouth daily.   clopidogrel (PLAVIX) 75 MG tablet Take 1 tablet by mouth daily.   cyclobenzaprine (FLEXERIL) 10 MG tablet Take 1 tablet by mouth at bedtime.   dapagliflozin propanediol (FARXIGA) 10 MG TABS tablet Take 1 tablet (10 mg total) by mouth daily.   famotidine (PEPCID) 20 MG tablet Take 1 tablet by mouth 2 (two) times daily.   Fluticasone-Umeclidin-Vilant (TRELEGY ELLIPTA) 100-62.5-25 MCG/ACT AEPB INHALE 1 PUFF INTO THE LUNGS ONCE DAILY    furosemide (LASIX) 40 MG tablet Take 1 tablet (40 mg total) by mouth 2 (two) times daily.   glucose blood (ACCU-CHEK GUIDE) test strip Use as instructed to check blood sugars twice a day  E11.59   isosorbide mononitrate (IMDUR) 60 MG 24 hr tablet Take 1 tablet by mouth daily.   losartan (COZAAR) 100 MG tablet Take 1 tablet by mouth daily.   metoprolol succinate (TOPROL-XL) 25 MG 24 hr tablet Take 1 tablet by mouth daily.   Misc. Devices (ADJUST BATH/SHOWER SEAT) MISC 1 shower seat for use at home to reduce risk of falling.   montelukast (SINGULAIR) 10 MG tablet Take 1 tablet by mouth at bedtime.   nitroGLYCERIN (NITROSTAT) 0.4 MG SL tablet DISSOLVE ONE TABLET UNDER THE TONGUE EVERY 5 MINUTES AS NEEDED FOR CHEST PAIN.  DO NOT EXCEED A TOTAL OF 3 DOSES IN 15 MINUTES   Omeprazole 20 MG TBEC Take 20 mg by mouth 2 (two) times daily.   oxybutynin (DITROPAN) 5 MG tablet Take 1 tablet by mouth daily.   ranolazine (RANEXA) 1000 MG SR tablet Take 1 tablet (1,000 mg total) by mouth 2 (two) times daily.   sertraline (ZOLOFT) 100 MG tablet Take 100 mg by mouth daily.   zolpidem (AMBIEN) 5 MG tablet TAKE 1 TABLET BY MOUTH AT BEDTIME AS NEEDED FOR SLEEP   No facility-administered encounter medications on file as of 04/20/2022.    Surgical History: Past Surgical History:  Procedure Laterality Date   ABDOMINAL HYSTERECTOMY  CARDIAC CATHETERIZATION     CARDIAC CATHETERIZATION N/A 10/18/2015   Procedure: Left Heart Cath and Cors/Grafts Angiography;  Surgeon: Lorretta Harp, MD;  Location: [place] [facility];  Service: Cardiovascular;  Laterality: N/A;   CARDIAC CATHETERIZATION N/A 10/18/2015   Procedure: Coronary Stent Intervention;  Surgeon: Lorretta Harp, MD;  Location: [place] [facility];  Service: Cardiovascular;  Laterality: N/A;   CARDIAC SURGERY     CHOLECYSTECTOMY     COLONOSCOPY WITH PROPOFOL N/A 12/02/2021   Procedure: COLONOSCOPY WITH PROPOFOL;  Surgeon: Jonathon Bellows, MD;  Location: [facility]  ENDOSCOPY;  Service: Gastroenterology;  Laterality: N/A;   CORONARY ANGIOPLASTY     CORONARY ARTERY BYPASS GRAFT     4 vessels - 2010   CORONARY STENT INTERVENTION N/A 02/16/2017   Procedure: CORONARY STENT INTERVENTION;  Surgeon: Yolonda Kida, MD;  Location: [place] [facility];  Service: Cardiovascular;  Laterality: N/A;   CORONARY STENT INTERVENTION N/A 02/13/2019   Procedure: CORONARY STENT INTERVENTION;  Surgeon: Nelva Bush, MD;  Location: [place] [facility];  Service: Cardiovascular;  Laterality: N/A;  SVG to RCA   CORONARY STENT INTERVENTION Left 03/08/2019   Procedure: CORONARY STENT INTERVENTION;  Surgeon: Yolonda Kida, MD;  Location: [place] [facility];  Service: Cardiovascular;  Laterality: Left;   CORONARY STENT INTERVENTION N/A 11/04/2020   Procedure: CORONARY STENT INTERVENTION;  Surgeon: Nelva Bush, MD;  Location: [place] [facility];  Service: Cardiovascular;  Laterality: N/A;   CORONARY STENT INTERVENTION N/A 12/24/2021   Procedure: CORONARY STENT INTERVENTION;  Surgeon: Andrez Grime, MD;  Location: [place] [facility];  Service: Cardiovascular;  Laterality: N/A;   ESOPHAGOGASTRODUODENOSCOPY N/A 12/02/2021   Procedure: ESOPHAGOGASTRODUODENOSCOPY (EGD);  Surgeon: Jonathon Bellows, MD;  Location: [facility] ENDOSCOPY;  Service: Gastroenterology;  Laterality: N/A;   LEFT HEART CATH AND CORONARY ANGIOGRAPHY Left 02/16/2017   Procedure: LEFT HEART CATH AND CORONARY ANGIOGRAPHY;  Surgeon: Corey Skains, MD;  Location: [place] [facility];  Service: Cardiovascular;  Laterality: Left;   LEFT HEART CATH AND CORONARY ANGIOGRAPHY Left 12/24/2021   Procedure: LEFT HEART CATH AND CORONARY ANGIOGRAPHY;  Surgeon: Corey Skains, MD;  Location: [place] [facility];  Service: Cardiovascular;  Laterality: Left;   LEFT HEART CATH AND CORS/GRAFTS ANGIOGRAPHY Left 11/23/2017   Procedure: LEFT HEART CATH AND CORS/GRAFTS ANGIOGRAPHY;  Surgeon: Corey Skains, MD;  Location:  [place] [facility];  Service: Cardiovascular;  Laterality: Left;   LEFT HEART CATH AND CORS/GRAFTS ANGIOGRAPHY N/A 02/13/2019   Procedure: LEFT HEART CATH AND CORS/GRAFTS ANGIOGRAPHY;  Surgeon: Corey Skains, MD;  Location: [place] [facility];  Service: Cardiovascular;  Laterality: N/A;   LEFT HEART CATH AND CORS/GRAFTS ANGIOGRAPHY N/A 07/03/2019   Procedure: LEFT HEART CATH AND CORS/GRAFTS ANGIOGRAPHY;  Surgeon: Corey Skains, MD;  Location: [place] [facility];  Service: Cardiovascular;  Laterality: N/A;   LEFT HEART CATH AND CORS/GRAFTS ANGIOGRAPHY N/A 11/04/2020   Procedure: LEFT HEART CATH AND CORS/GRAFTS ANGIOGRAPHY;  Surgeon: Corey Skains, MD;  Location: [place] [facility];  Service: Cardiovascular;  Laterality: N/A;    Medical History: Past Medical History:  Diagnosis Date   Asthma    Coronary artery disease    Diabetes mellitus without complication ([place])    Heart attack ([place])    Hyperlipidemia    Hypertension    MI (myocardial infarction) ([place])    Migraine headache with aura    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  Family History: Family History  Problem Relation Age of Onset   Diabetes Mother    Diabetes Father    Cancer Father    Diabetes Brother     Social History   Socioeconomic History   Marital status: Widowed    Spouse name: Jeneen Rinks   Number of children: Not on file   Years of education: Not on file   Highest education level: Not on file  Occupational History   Not on file  Tobacco Use   Smoking status: Never   Smokeless tobacco: Never  Vaping Use   Vaping Use: Never used  Substance and Sexual Activity   Alcohol use: No    Alcohol/week: 0.0 standard drinks of alcohol   Drug use: No   Sexual activity: Yes  Other Topics Concern   Not on file  Social History Narrative   Not on file   Social Determinants of Health   Financial Resource Strain: Not on file  Food Insecurity: Not on file  Transportation Needs: Not on file  Physical  Activity: Not on file  Stress: Not on file  Social Connections: Not on file  Intimate Partner Violence: Not on file      Review of Systems  Constitutional:  Negative for chills, fatigue and unexpected weight change.  HENT:  Positive for postnasal drip. Negative for congestion, rhinorrhea, sneezing and sore throat.   Eyes:  Negative for redness.  Respiratory:  Negative for cough, chest tightness and shortness of breath.   Cardiovascular:  Negative for chest pain and palpitations.  Gastrointestinal:  Negative for abdominal pain, constipation, diarrhea, nausea and vomiting.  Genitourinary:  Negative for dysuria and frequency.  Musculoskeletal:  Negative for arthralgias, back pain, joint swelling and neck pain.  Skin:  Negative for rash.  Neurological: Negative.  Negative for tremors and numbness.  Hematological:  Negative for adenopathy. Does not bruise/bleed easily.  Psychiatric/Behavioral:  Negative for behavioral problems (Depression), sleep disturbance and suicidal ideas. The patient is not nervous/anxious.     Vital Signs: BP (!) 151/8   Pulse 84   Temp 98.2 F (36.8 C)   Resp 16   Ht _0  (1.702 m)   Wt 241 lb 12.8 oz (109.7 kg)   SpO2 98%   BMI 37.87 kg/m    Physical Exam Constitutional:      Appearance: Normal appearance.  HENT:     Head: Normocephalic and atraumatic.     Nose: Nose normal.     Mouth/Throat:     Mouth: Mucous membranes are moist.     Pharynx: No posterior oropharyngeal erythema.  Eyes:     Extraocular Movements: Extraocular movements intact.     Pupils: Pupils are equal, round, and reactive to light.  Cardiovascular:     Pulses: Normal pulses.     Heart sounds: Normal heart sounds.  Pulmonary:     Effort: Pulmonary effort is normal.     Breath sounds: Normal breath sounds.  Neurological:     General: No focal deficit present.     Mental Status: She is alert.  Psychiatric:        Mood and Affect: Mood normal.        Behavior: Behavior  normal.       Assessment/Plan: 1. Type 2 diabetes mellitus with other circulatory complication, without long-term current use of insulin ([place]) Updated all medications, discarded duplicates. Pt is only on Jardiance and Trulicity  - POCT HgB J6R - Urine Microalbumin w/creat. ratio - POCT Glucose (CBG) -  empagliflozin (JARDIANCE) 25 MG TABS tablet; Take one tab a day for diabetes  Dispense: 90 tablet; Refill: 3 - Dulaglutide (TRULICITY) 1.5 JQ/7.3AL SOPN; Inject 1.5 mg into the skin once a week.  Dispense: 6 mL; Refill: 1  2. Coronary artery disease involving coronary bypass graft of native heart with other forms of angina pectoris ([place]) Refilled which she has been out  - isosorbide mononitrate (IMDUR) 60 MG 24 hr tablet; Take one tab po qhs for heart  Dispense: 90 tablet; Refill: 1 - metoprolol succinate (TOPROL-XL) 25 MG 24 hr tablet; Take 1 tablet (25 mg total) by mouth daily.  Dispense: 90 tablet; Refill: 1  3. Other insomnia Continue Ambien as before  - zolpidem (AMBIEN) 5 MG tablet; Take 1 tablet (5 mg total) by mouth at bedtime as needed. for sleep  Dispense: 30 tablet; Refill: 0  4. Essential hypertension DC invokana, continue Losartan, this is duplicate   5. Mixed hyperlipidemia Continue Lipitor as before  - atorvastatin (LIPITOR) 40 MG tablet; Take 1 tablet (40 mg total) by mouth daily.  Dispense: 90 tablet; Refill: 3  6. Atherosclerosis of aorta ([place]) Pt is on Lipitor    General Counseling: Shawan verbalizes understanding of the findings of todays visit and agrees with plan of treatment. I have discussed any further diagnostic evaluation that may be needed or ordered today. We also reviewed her medications today. she has been encouraged to call the office with any questions or concerns that should arise related to todays visit.    Counseling:  [place] Controlled Substance Database was reviewed by me.  Orders Placed This Encounter  Procedures   Urine Microalbumin  w/creat. ratio   POCT HgB A1C      I have reviewed all medical records from hospital follow up including radiology reports and consults from other physicians. Appropriate follow up diagnostics will be scheduled as needed. Patient/ Family understands the plan of treatment. Time spent 35 minutes.   Dr Lavera Guise, MD Internal Medicine

## 2022-04-20 NOTE — Addendum Note (Signed)
Addended by: Toma Deiters on: 04/20/2022 03:06 PM   Modules accepted: Orders

## 2022-04-21 ENCOUNTER — Encounter: Payer: Medicare (Managed Care) | Admitting: Physical Therapy

## 2022-04-21 LAB — MICROALBUMIN / CREATININE URINE RATIO
Creatinine, Urine: 73.7 mg/dL
Microalb/Creat Ratio: <4 mg/g creat (ref 0–29)
Microalbumin, Urine: <3 ug/mL

## 2022-04-22 ENCOUNTER — Other Ambulatory Visit: Payer: Self-pay

## 2022-04-22 ENCOUNTER — Encounter: Payer: Medicare (Managed Care) | Admitting: Physical Therapy

## 2022-04-22 DIAGNOSIS — E1165 Type 2 diabetes mellitus with hyperglycemia: Secondary | ICD-10-CM

## 2022-04-22 MED ORDER — ACCU-CHEK GUIDE VI STRP: ORAL_STRIP | 3 refills | Status: DC

## 2022-04-22 MED ORDER — ACCU-CHEK SOFTCLIX LANCETS MISC: 3 refills | Status: DC

## 2022-04-28 ENCOUNTER — Encounter: Payer: Medicare (Managed Care) | Admitting: Physical Therapy

## 2022-04-29 ENCOUNTER — Encounter: Payer: Medicare Other | Attending: Cardiology | Admitting: *Deleted

## 2022-04-29 ENCOUNTER — Encounter: Payer: Medicare (Managed Care) | Admitting: Physical Therapy

## 2022-04-29 DIAGNOSIS — Z48812 Encounter for surgical aftercare following surgery on the circulatory system: Secondary | ICD-10-CM | POA: Insufficient documentation

## 2022-04-29 DIAGNOSIS — Z955 Presence of coronary angioplasty implant and graft: Secondary | ICD-10-CM | POA: Insufficient documentation

## 2022-05-04 ENCOUNTER — Ambulatory Visit: Payer: Medicare Other

## 2022-05-04 ENCOUNTER — Encounter: Payer: Medicare Other | Admitting: *Deleted

## 2022-05-04 DIAGNOSIS — Z955 Presence of coronary angioplasty implant and graft: Secondary | ICD-10-CM | POA: Diagnosis present

## 2022-05-04 DIAGNOSIS — Z48812 Encounter for surgical aftercare following surgery on the circulatory system: Secondary | ICD-10-CM | POA: Diagnosis not present

## 2022-05-04 NOTE — Progress Notes (Signed)
Daily Session Note  Patient Details  Name: Felicia Woods MRN: [id number] Date of Birth: 05-May-1956 Referring Provider:   Flowsheet Row Cardiac Rehab from 02/08/2022 in [facility]  Referring Provider Corky Sox       Encounter Date: 05/04/2022  Check In:  Session Check In - 05/04/22 1019       Check-In   Supervising physician immediately available to respond to emergencies See telemetry face sheet for immediately available ER MD    Location [facility]Card[facility]    Staff Present Renita Papa, RN BSN;Jessica [place], MA, RCEP, CCRP, Marylynn Pearson, MS, ASCM CEP, Exercise Physiologist    Virtual Visit No    Medication changes reported     Yes    Comments added ranexa    Fall or balance concerns reported    No    Warm-up and Cool-down Performed on first and last piece of equipment    Resistance Training Performed Yes    VAD Patient? No    PAD/SET Patient? No      Pain Assessment   Currently in Pain? No/denies                Social History   Tobacco Use  Smoking Status Never  Smokeless Tobacco Never    Goals Met:  Independence with exercise equipment Exercise tolerated well No report of concerns or symptoms today Strength training completed today  Goals Unmet:  Not Applicable  Comments: Pt able to follow exercise prescription today without complaint.  Will continue to monitor for progression.    Dr. Emily Filbert is Medical Director for [facility].  Dr. Ottie Glazier is Medical Director for [facility].

## 2022-05-05 ENCOUNTER — Encounter: Payer: Self-pay | Admitting: *Deleted

## 2022-05-05 DIAGNOSIS — Z955 Presence of coronary angioplasty implant and graft: Secondary | ICD-10-CM

## 2022-05-05 NOTE — Progress Notes (Signed)
Cardiac Individual Treatment Plan  Patient Details  Name: Felicia Woods MRN: [id number] Date of Birth: 07-16-1955 Referring Provider:   Flowsheet Row Cardiac Rehab from 02/08/2022 in [facility] Cardiac and Pulmonary Rehab  Referring Provider Corky Sox       Initial Encounter Date:  Flowsheet Row Cardiac Rehab from 02/08/2022 in [facility] Cardiac and Pulmonary Rehab  Date 02/08/22       Visit Diagnosis: Status post coronary artery stent placement  Patient's Home Medications on Admission:  Current Outpatient Medications:    Accu-Chek Softclix Lancets lancets, Use as instructed twice a day to check blood sugars  DIAG -E11.59, Disp: 100 each, Rfl: 3   albuterol (VENTOLIN HFA) 108 (90 Base) MCG/ACT inhaler, Inhale 2 puffs into the lungs every 6 (six) hours as needed for wheezing or shortness of breath., Disp: 8 g, Rfl: 6   amLODipine (NORVASC) 10 MG tablet, Take 1 tablet by mouth daily., Disp: , Rfl:    aspirin EC 81 MG tablet, Take 81 mg by mouth daily., Disp: , Rfl:    atorvastatin (LIPITOR) 40 MG tablet, Take 1 tablet (40 mg total) by mouth daily., Disp: 90 tablet, Rfl: 3   clopidogrel (PLAVIX) 75 MG tablet, Take 1 tablet by mouth daily., Disp: , Rfl:    cyclobenzaprine (FLEXERIL) 10 MG tablet, Take 1 tablet by mouth at bedtime., Disp: , Rfl:    Dulaglutide (TRULICITY) 1.5 YH/0.6CB SOPN, Inject 1.5 mg into the skin once a week., Disp: 6 mL, Rfl: 1   empagliflozin (JARDIANCE) 25 MG TABS tablet, Take one tab a day for diabetes, Disp: 90 tablet, Rfl: 3   famotidine (PEPCID) 20 MG tablet, Take 1 tablet by mouth 2 (two) times daily., Disp: , Rfl:    Fluticasone-Umeclidin-Vilant (TRELEGY ELLIPTA) 100-62.5-25 MCG/ACT AEPB, INHALE 1 PUFF INTO THE LUNGS ONCE DAILY, Disp: , Rfl:    furosemide (LASIX) 40 MG tablet, Take 1 tablet (40 mg total) by mouth 2 (two) times daily., Disp: 180 tablet, Rfl: 1   glucose blood (ACCU-CHEK GUIDE) test strip, Use as instructed to check blood sugars twice a day  E11.59, Disp:  100 each, Rfl: 3   isosorbide mononitrate (IMDUR) 60 MG 24 hr tablet, Take one tab po qhs for heart, Disp: 90 tablet, Rfl: 1   losartan (COZAAR) 100 MG tablet, Take 1 tablet by mouth daily., Disp: , Rfl:    metoprolol succinate (TOPROL-XL) 25 MG 24 hr tablet, Take 1 tablet (25 mg total) by mouth daily., Disp: 90 tablet, Rfl: 1   Misc. Devices (ADJUST BATH/SHOWER SEAT) MISC, 1 shower seat for use at home to reduce risk of falling., Disp: 1 each, Rfl: 0   montelukast (SINGULAIR) 10 MG tablet, Take 1 tablet by mouth at bedtime., Disp: , Rfl:    nitroGLYCERIN (NITROSTAT) 0.4 MG SL tablet, DISSOLVE ONE TABLET UNDER THE TONGUE EVERY 5 MINUTES AS NEEDED FOR CHEST PAIN.  DO NOT EXCEED A TOTAL OF 3 DOSES IN 15 MINUTES, Disp: 30 tablet, Rfl: 3   oxybutynin (DITROPAN) 5 MG tablet, Take 1 tablet by mouth daily., Disp: , Rfl:    ranolazine (RANEXA) 1000 MG SR tablet, Take 1 tablet (1,000 mg total) by mouth 2 (two) times daily., Disp: 60 tablet, Rfl: 1   zolpidem (AMBIEN) 5 MG tablet, Take 1 tablet (5 mg total) by mouth at bedtime as needed. for sleep, Disp: 30 tablet, Rfl: 0  Past Medical History: Past Medical History:  Diagnosis Date   Asthma    Coronary artery disease  Diabetes mellitus without complication ([place])    Heart attack ([place])    Hyperlipidemia    Hypertension    MI (myocardial infarction) ([place])    Migraine headache with aura    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    Tobacco Use: Social History   Tobacco Use  Smoking Status Never  Smokeless Tobacco Never    Labs: Review Flowsheet  More data exists      Latest Ref Rng & Units 06/10/2021 09/03/2021 09/11/2021 12/03/2021 04/20/2022  Labs for ITP Cardiac and Pulmonary Rehab  Cholestrol 0 - 200 mg/dL 181  - 243  - -  LDL (calc) 0 - 99 mg/dL 125  - 181  - -  HDL-C >40 mg/dL 32  - 50  - -  Trlycerides <150 mg/dL 121  - 62  - -  Hemoglobin A1c 4.0 - 5.6 % 8.2  7.1  - 7.2  8.3      Exercise Target Goals: Exercise Program  Goal: Individual exercise prescription set using results from initial 6 min walk test and THRR while considering  patient's activity barriers and safety.   Exercise Prescription Goal: Initial exercise prescription builds to 30-45 minutes a day of aerobic activity, 2-3 days per week.  Home exercise guidelines will be given to patient during program as part of exercise prescription that the participant will acknowledge.   Education: Aerobic Exercise: - Group verbal and visual presentation on the components of exercise prescription. Introduces F.I.T.T principle from ACSM for exercise prescriptions.  Reviews F.I.T.T. principles of aerobic exercise including progression. Written material given at graduation. Flowsheet Row Cardiac Rehab from 03/25/2022 in [facility] Cardiac and Pulmonary Rehab  Education need identified 02/08/22       Education: Resistance Exercise: - Group verbal and visual presentation on the components of exercise prescription. Introduces F.I.T.T principle from ACSM for exercise prescriptions  Reviews F.I.T.T. principles of resistance exercise including progression. Written material given at graduation.    Education: Exercise & Equipment Safety: - Individual verbal instruction and demonstration of equipment use and safety with use of the equipment. Flowsheet Row Cardiac Rehab from 03/25/2022 in [facility] Cardiac and Pulmonary Rehab  Date 02/08/22  Educator [place]  Instruction Review Code 1- Verbalizes Understanding       Education: Exercise Physiology & General Exercise Guidelines: - Group verbal and written instruction with models to review the exercise physiology of the cardiovascular system and associated critical values. Provides general exercise guidelines with specific guidelines to those with heart or lung disease.    Education: Flexibility, Balance, Mind/Body Relaxation: - Group verbal and visual presentation with interactive activity on the components of exercise prescription.  Introduces F.I.T.T principle from ACSM for exercise prescriptions. Reviews F.I.T.T. principles of flexibility and balance exercise training including progression. Also discusses the mind body connection.  Reviews various relaxation techniques to help reduce and manage stress (i.e. Deep breathing, progressive muscle relaxation, and visualization). Balance handout provided to take home. Written material given at graduation.   Activity Barriers & Risk Stratification:  Activity Barriers & Cardiac Risk Stratification - 01/19/22 0907       Activity Barriers & Cardiac Risk Stratification   Activity Barriers Arthritis;Joint Problems;Shortness of Breath    Cardiac Risk Stratification High             6 Minute Walk:  6 Minute Walk     Row Name 02/08/22 1612         6 Minute Walk   Phase Initial  Distance 665 feet     Walk Time 5.5 minutes     # of Rest Breaks 1     MPH 1.38     METS 1.65     RPE 12     Perceived Dyspnea  1     VO2 Peak 5.77     Symptoms Yes (comment)     Comments Chest pain 7/10 during walk. Relieved with rest and then resumed walk with no chest pain.     Resting HR 84 bpm     Resting BP 110/60     Resting Oxygen Saturation  97 %     Exercise Oxygen Saturation  during 6 min walk 97 %     Max Ex. HR 108 bpm     Max Ex. BP 138/60     2 Minute Post BP 118/78              Oxygen Initial Assessment:   Oxygen Re-Evaluation:   Oxygen Discharge (Final Oxygen Re-Evaluation):   Initial Exercise Prescription:  Initial Exercise Prescription - 02/08/22 1600       Date of Initial Exercise RX and Referring Provider   Date 02/08/22    Referring Provider Corky Sox      Oxygen   Maintain Oxygen Saturation 88% or higher      NuStep   Level 1    SPM 80    Minutes 15    METs 1.65      Arm Ergometer   Level 1    RPM 30    Minutes 15    METs 1.65      REL-XR   Level 1    Speed 50    Minutes 15    METs 1.65      Biostep-RELP   Level 1    SPM 50     Minutes 15    METs 1.65      Track   Laps 7    Minutes 15    METs 1.38      Intensity   THRR 40-80% of Max Heartrate 112-141    Ratings of Perceived Exertion 11-13    Perceived Dyspnea 0-4      Progression   Progression Continue to progress workloads to maintain intensity without signs/symptoms of physical distress.      Resistance Training   Training Prescription Yes    Weight 3    Reps 10-15             Perform Capillary Blood Glucose checks as needed.  Exercise Prescription Changes:   Exercise Prescription Changes     Row Name 02/08/22 1600 03/02/22 1500 03/18/22 1700 03/29/22 1400       Response to Exercise   Blood Pressure (Admit) 110/60 124/82 122/56 128/72    Blood Pressure (Exercise) 138/60 134/70 -- --    Blood Pressure (Exit) 118/78 120/80 122/64 104/72    Heart Rate (Admit) 84 bpm 83 bpm 98 bpm 99 bpm    Heart Rate (Exercise) 108 bpm 131 bpm 132 bpm 127 bpm    Heart Rate (Exit) 85 bpm 86 bpm 95 bpm 97 bpm    Oxygen Saturation (Admit) 97 % -- -- --    Oxygen Saturation (Exercise) 97 % -- -- --    Oxygen Saturation (Exit) 97 % -- -- --    Rating of Perceived Exertion (Exercise) _0 Perceived Dyspnea (Exercise) 1 -- -- --    Symptoms Chest pain 7/10  relieved with rest. Resumed walk test and finished with no more pain. knee pain, fatigue none none    Comments 6MWT results 2nd full day of exercise -- --    Duration -- Progress to 30 minutes of  aerobic without signs/symptoms of physical distress Continue with 30 min of aerobic exercise without signs/symptoms of physical distress. Continue with 30 min of aerobic exercise without signs/symptoms of physical distress.    Intensity -- THRR unchanged THRR unchanged THRR unchanged      Progression   Progression -- Continue to progress workloads to maintain intensity without signs/symptoms of physical distress. Continue to progress workloads to maintain intensity without signs/symptoms of physical  distress. Continue to progress workloads to maintain intensity without signs/symptoms of physical distress.    Average METs -- 2.19 2.81 2.71      Resistance Training   Training Prescription -- Yes Yes Yes    Weight -- 3 lb 3 lb 3 lb    Reps -- 10-15 10-15 10-15      Interval Training   Interval Training -- No No No      Arm Ergometer   Level -- _0 Minutes -- _1 METs -- 2.4 2.3 2.5      REL-XR   Level -- -- 3 2    Minutes -- -- 15 15    METs -- -- -- 4      Track   Laps -- _2 Minutes -- _3 METs -- 2.09 2.14 1.65      Oxygen   Maintain Oxygen Saturation -- -- 88% or higher 88% or higher             Exercise Comments:   Exercise Comments     Row Name 02/18/22 1001 03/11/22 1036         Exercise Comments First full day of exercise!  Patient was oriented to gym and equipment including functions, settings, policies, and procedures.  Patient's individual exercise prescription and treatment plan were reviewed.  All starting workloads were established based on the results of the 6 minute walk test done at initial orientation visit.  The plan for exercise progression was also introduced and progression will be customized based on patient's performance and goals. Jovie is using nitro sl at least 2 x a day for chest pain with exertion. Advised her to notify MD of this. Also gave her another med name  to review with her physician for angina control.               Exercise Goals and Review:   Exercise Goals     Row Name 02/08/22 1624             Exercise Goals   Increase Physical Activity Yes       Intervention Provide advice, education, support and counseling about physical activity/exercise needs.;Develop an individualized exercise prescription for aerobic and resistive training based on initial evaluation findings, risk stratification, comorbidities and participant's personal goals.       Expected Outcomes Short Term: Attend  rehab on a regular basis to increase amount of physical activity.;Long Term: Add in home exercise to make exercise part of routine and to increase amount of physical activity.;Long Term: Exercising regularly at least 3-5 days a week.       Increase Strength and Stamina Yes       Intervention Provide advice, education, support  and counseling about physical activity/exercise needs.;Develop an individualized exercise prescription for aerobic and resistive training based on initial evaluation findings, risk stratification, comorbidities and participant's personal goals.       Expected Outcomes Short Term: Increase workloads from initial exercise prescription for resistance, speed, and METs.;Short Term: Perform resistance training exercises routinely during rehab and add in resistance training at home;Long Term: Improve cardiorespiratory fitness, muscular endurance and strength as measured by increased METs and functional capacity (6MWT)       Able to understand and use rate of perceived exertion (RPE) scale Yes       Intervention Provide education and explanation on how to use RPE scale       Expected Outcomes Short Term: Able to use RPE daily in rehab to express subjective intensity level;Long Term:  Able to use RPE to guide intensity level when exercising independently       Able to understand and use Dyspnea scale Yes       Intervention Provide education and explanation on how to use Dyspnea scale       Expected Outcomes Short Term: Able to use Dyspnea scale daily in rehab to express subjective sense of shortness of breath during exertion;Long Term: Able to use Dyspnea scale to guide intensity level when exercising independently       Knowledge and understanding of Target Heart Rate Range (THRR) Yes       Intervention Provide education and explanation of THRR including how the numbers were predicted and where they are located for reference       Expected Outcomes Short Term: Able to state/look up  THRR;Short Term: Able to use daily as guideline for intensity in rehab;Long Term: Able to use THRR to govern intensity when exercising independently       Able to check pulse independently Yes       Intervention Provide education and demonstration on how to check pulse in carotid and radial arteries.;Review the importance of being able to check your own pulse for safety during independent exercise       Expected Outcomes Short Term: Able to explain why pulse checking is important during independent exercise;Long Term: Able to check pulse independently and accurately       Understanding of Exercise Prescription Yes       Intervention Provide education, explanation, and written materials on patient's individual exercise prescription       Expected Outcomes Short Term: Able to explain program exercise prescription;Long Term: Able to explain home exercise prescription to exercise independently                Exercise Goals Re-Evaluation :  Exercise Goals Re-Evaluation     Row Name 02/18/22 1002 03/02/22 1521 03/18/22 1749 03/29/22 1404 04/13/22 1513     Exercise Goal Re-Evaluation   Exercise Goals Review Able to understand and use rate of perceived exertion (RPE) scale;Knowledge and understanding of Target Heart Rate Range (THRR);Able to understand and use Dyspnea scale;Able to check pulse independently;Understanding of Exercise Prescription Increase Physical Activity;Increase Strength and Stamina;Understanding of Exercise Prescription Increase Physical Activity;Increase Strength and Stamina;Understanding of Exercise Prescription Increase Physical Activity;Increase Strength and Stamina;Understanding of Exercise Prescription Increase Physical Activity;Increase Strength and Stamina;Understanding of Exercise Prescription   Comments Reviewed RPE and dyspnea scales, THR and program prescription with pt today.  Pt voiced understanding and was given a copy of goals to take home. Vicke is off to a good  start in rehab. She has only worked on the arm ergometer  and track so far, however, was able to complete full 20 laps on the track! Her HR reached her THR. We will continue to monitor her progress as she progresses in the program. Brandi is doing well in rehab. She recently improved her average overall MET level to 2.81 METs. She also improved to level 3 on the XR and got up to 21 laps on the track. We will continue to monitor her progress. Jonella is doing well in rehab. She has done as many as 21 laps, but is now back down to 12 laps.  She is on level 2 for the XR.  We will talk to her about getting consistent with workloads to see improvement. Ambrosia had another MI and was in the hospital from 10/03-10/05. We will reach out to her and see if she wants to get clearance from her doctor or get a new referral.   Expected Outcomes Short: Use RPE daily to regulate intensity. Long: Follow program prescription in THR. Short: Continue to work up laps on the track Long: Work on increasing overall MET level Short: Continue to increase workloads and laps on the track. Long: Continue to increase strength and stamina. Short: Attend rehab consistently and keep workloads consistent  Long: Cointinue to improve stamina Short: Return to rehab. Long: Continue to increase strength and stamina.    [place] Name 04/26/22 1056 05/04/22 1000           Exercise Goal Re-Evaluation   Exercise Goals Review Increase Physical Activity;Increase Strength and Stamina;Understanding of Exercise Prescription Increase Physical Activity;Increase Strength and Stamina;Understanding of Exercise Prescription      Comments Staff spoke with Brynli to let her know she was cleared to come back to the program, per her Pulmonologist follow up appt. Patient plans to resume this week. Jalisa returned today.  She has been out since being hospitalized with another heart event. She has not been exercising since discharge as she has been working more.       Expected Outcomes Short: Return to rehab and ease into it Long: Build up METS and overall strength Short; Return to regular rehab attendance Long; Return to routine exercise daily               Discharge Exercise Prescription (Final Exercise Prescription Changes):  Exercise Prescription Changes - 03/29/22 1400       Response to Exercise   Blood Pressure (Admit) 128/72    Blood Pressure (Exit) 104/72    Heart Rate (Admit) 99 bpm    Heart Rate (Exercise) 127 bpm    Heart Rate (Exit) 97 bpm    Rating of Perceived Exertion (Exercise) 13    Symptoms none    Duration Continue with 30 min of aerobic exercise without signs/symptoms of physical distress.    Intensity THRR unchanged      Progression   Progression Continue to progress workloads to maintain intensity without signs/symptoms of physical distress.    Average METs 2.71      Resistance Training   Training Prescription Yes    Weight 3 lb    Reps 10-15      Interval Training   Interval Training No      Arm Ergometer   Level 1    Minutes 15    METs 2.5      REL-XR   Level 2    Minutes 15    METs 4      Track   Laps 12    Minutes 15  METs 1.65      Oxygen   Maintain Oxygen Saturation 88% or higher             Nutrition:  Target Goals: Understanding of nutrition guidelines, daily intake of sodium <153m, cholesterol <2029m calories 30% from fat and 7% or less from saturated fats, daily to have 5 or more servings of fruits and vegetables.  Education: All About Nutrition: -Group instruction provided by verbal, written material, interactive activities, discussions, models, and posters to present general guidelines for heart healthy nutrition including fat, fiber, MyPlate, the role of sodium in heart healthy nutrition, utilization of the nutrition label, and utilization of this knowledge for meal planning. Follow up email sent as well. Written material given at graduation. [facility]  Education need identified 02/08/22       Biometrics:  Pre Biometrics - 02/08/22 1627       Pre Biometrics   Height 5' 5.75" (1.67 m)    Weight 234 lb 1.6 oz (106.2 kg)    BMI (Calculated) 38.07    Single Leg Stand 6.6 seconds              Nutrition Therapy Plan and Nutrition Goals:  Nutrition Therapy & Goals - 02/08/22 1628       Intervention Plan   Intervention Prescribe, educate and counsel regarding individualized specific dietary modifications aiming towards targeted core components such as weight, hypertension, lipid management, diabetes, heart failure and other comorbidities.    Expected Outcomes Short Term Goal: Understand basic principles of dietary content, such as calories, fat, sodium, cholesterol and nutrients.;Short Term Goal: A plan has been developed with personal nutrition goals set during dietitian appointment.;Long Term Goal: Adherence to prescribed nutrition plan.             Nutrition Assessments:  MEDIFICTS Score Key: ?70 Need to make dietary changes  40-70 Heart Healthy Diet ? 40 Therapeutic Level Cholesterol Diet  [facility]  Picture Your Plate Total Score on Admission 41      Picture Your Plate Scores: <4<17nhealthy dietary pattern with much room for improvement. 41-50 Dietary pattern unlikely to meet recommendations for good health and room for improvement. 51-60 More healthful dietary pattern, with some room for improvement.  >60 Healthy dietary pattern, although there may be some specific behaviors that could be improved.    Nutrition Goals Re-Evaluation:  Nutrition Goals Re-Evaluation     RoMcCroryame 05/05/22 0656             Goals   Nutrition Goal Set up appt with dietitian       Comment ViRenee Rivalas been out for over a month and has not had an appointment with Melissa yet.       Expected Outcome Set up  appointment with dietitian                Nutrition Goals Discharge (Final Nutrition Goals Re-Evaluation):  Nutrition Goals Re-Evaluation - 05/05/22 0656       Goals   Nutrition Goal Set up appt with dietitian    Comment ViRenee Rivalas been out for over a month and has not had an appointment with Melissa yet.    Expected Outcome Set up appointment with dietitian             Psychosocial: Target Goals: Acknowledge presence or absence of significant depression and/or stress, maximize coping  skills, provide positive support system. Participant is able to verbalize types and ability to use techniques and skills needed for reducing stress and depression.   Education: Stress, Anxiety, and Depression - Group verbal and visual presentation to define topics covered.  Reviews how body is impacted by stress, anxiety, and depression.  Also discusses healthy ways to reduce stress and to treat/manage anxiety and depression.  Written material given at graduation. Flowsheet Row Cardiac Rehab from 03/25/2022 in [facility] Cardiac and Pulmonary Rehab  Education need identified 02/08/22       Education: Sleep Hygiene -Provides group verbal and written instruction about how sleep can affect your health.  Define sleep hygiene, discuss sleep cycles and impact of sleep habits. Review good sleep hygiene tips.    Initial Review & Psychosocial Screening:  Initial Psych Review & Screening - 01/19/22 0908       Initial Review   Current issues with None Identified      Family Dynamics   Good Support System? Yes   husband and her children     Barriers   Psychosocial barriers to participate in program There are no identifiable barriers or psychosocial needs.;The patient should benefit from training in stress management and relaxation.      Screening Interventions   Interventions Encouraged to exercise;To provide support and resources with identified psychosocial needs;Provide feedback about the scores to  participant    Expected Outcomes Short Term goal: Utilizing psychosocial counselor, staff and physician to assist with identification of specific Stressors or current issues interfering with healing process. Setting desired goal for each stressor or current issue identified.;Long Term Goal: Stressors or current issues are controlled or eliminated.;Short Term goal: Identification and review with participant of any Quality of Life or Depression concerns found by scoring the questionnaire.;Long Term goal: The participant improves quality of Life and PHQ9 Scores as seen by post scores and/or verbalization of changes             Quality of Life Scores:   Quality of Life - 02/08/22 1630       Quality of Life   Select Quality of Life      Quality of Life Scores   Health/Function Pre 23.93 %    Socioeconomic Pre 28.17 %    Psych/Spiritual Pre 30 %    Family Pre 30 %    GLOBAL Pre 27 %            Scores of 19 and below usually indicate a poorer quality of life in these areas.  A difference of  2-3 points is a clinically meaningful difference.  A difference of 2-3 points in the total score of the Quality of Life Index has been associated with significant improvement in overall quality of life, self-image, physical symptoms, and general health in studies assessing change in quality of life.  PHQ-9: Review Flowsheet  More data exists      04/20/2022 02/08/2022 12/03/2021 09/03/2021 03/12/2021  Depression screen PHQ 2/9  Decreased Interest 0 1 0 0 0  Down, Depressed, Hopeless 0 0 0 0 0  PHQ - 2 Score 0 1 0 0 0  Altered sleeping - 0 - - -  Tired, decreased energy - 0 - - -  Change in appetite - 0 - - -  Feeling bad or failure about yourself  - 0 - - -  Trouble concentrating - 0 - - -  Moving slowly or fidgety/restless - 0 - - -  Suicidal thoughts - 0 - - -  PHQ-9 Score - 1 - - -  Difficult doing work/chores - Not difficult at all - - -   Interpretation of Total Score  Total Score  Depression Severity:  1-4 = Minimal depression, 5-9 = Mild depression, 10-14 = Moderate depression, 15-19 = Moderately severe depression, 20-27 = Severe depression   Psychosocial Evaluation and Intervention:  Psychosocial Evaluation - 01/19/22 0912       Psychosocial Evaluation & Interventions   Comments Jamariah is again returning to [facility] after another stent. SHe has no barriers to attending the program. She now lives in a house and no longer in a second floor apartment. Her support team is her husband and her children. She is ready to return and continue to learn heart healthy living.    Expected Outcomes STG Ercell will attend all scheduled sessions, she will progress with her exercise LTG Kami will be able to continue with ehr exercie progression and maintain control of her risk factors.    Continue Psychosocial Services  Follow up required by staff             Psychosocial Re-Evaluation:  Psychosocial Re-Evaluation     [place] Name 05/04/22 1000             Psychosocial Re-Evaluation   Current issues with Current Stress Concerns       Comments Trellis returned today after last heart event has had her out for over a month.  She tries not to let too much get to her, but they are short staffed at work.  This makes her feel over worked and unable to do much else.       Expected Outcomes Short; Return to routine exercise for mental boost Long: continue to cope with work       Interventions Encouraged to attend Cardiac Rehabilitation for the exercise;Stress management education       Continue Psychosocial Services  Follow up required by staff                Psychosocial Discharge (Final Psychosocial Re-Evaluation):  Psychosocial Re-Evaluation - 05/04/22 1000       Psychosocial Re-Evaluation   Current issues with Current Stress Concerns    Comments Suri returned today after last heart event has had her out for over a month.  She tries not to let too much get to her, but they  are short staffed at work.  This makes her feel over worked and unable to do much else.    Expected Outcomes Short; Return to routine exercise for mental boost Long: continue to cope with work    Interventions Encouraged to attend Cardiac Rehabilitation for the exercise;Stress management education    Continue Psychosocial Services  Follow up required by staff             Vocational Rehabilitation: Provide vocational rehab assistance to qualifying candidates.   Vocational Rehab Evaluation & Intervention:  Vocational Rehab - 01/19/22 0912       Initial Vocational Rehab Evaluation & Intervention   Assessment shows need for Vocational Rehabilitation No      Vocational Rehab Re-Evaulation   Comments no request fro VR             Education: Education Goals: Education classes will be provided on a variety of topics geared toward better understanding of heart health and risk factor modification. Participant will state understanding/return demonstration of topics presented as noted by education test scores.  Learning Barriers/Preferences:   General Cardiac Education Topics:  AED/CPR: - Group verbal and written instruction with the use of models to demonstrate the basic use of the AED with the basic ABC's of resuscitation.   Anatomy and Cardiac Procedures: - Group verbal and visual presentation and models provide information about basic cardiac anatomy and function. Reviews the testing methods done to diagnose heart disease and the outcomes of the test results. Describes the treatment choices: Medical Management, Angioplasty, or Coronary Bypass Surgery for treating various heart conditions including Myocardial Infarction, Angina, Valve Disease, and Cardiac Arrhythmias.  Written material given at graduation. Flowsheet Row Cardiac Rehab from 03/25/2022 in [facility] Cardiac and Pulmonary Rehab  Education need identified 02/08/22  Date 03/25/22  Educator sb  Instruction Review Code 1-  Verbalizes Understanding       Medication Safety: - Group verbal and visual instruction to review commonly prescribed medications for heart and lung disease. Reviews the medication, class of the drug, and side effects. Includes the steps to properly store meds and maintain the prescription regimen.  Written material given at graduation.   Intimacy: - Group verbal instruction through game format to discuss how heart and lung disease can affect sexual intimacy. Written material given at graduation..   Know Your Numbers and Heart Failure: - Group verbal and visual instruction to discuss disease risk factors for cardiac and pulmonary disease and treatment options.  Reviews associated critical values for Overweight/Obesity, Hypertension, Cholesterol, and Diabetes.  Discusses basics of heart failure: signs/symptoms and treatments.  Introduces Heart Failure Zone chart for action plan for heart failure.  Written material given at graduation.   Infection Prevention: - Provides verbal and written material to individual with discussion of infection control including proper hand washing and proper equipment cleaning during exercise session. Flowsheet Row Cardiac Rehab from 03/25/2022 in [facility] Cardiac and Pulmonary Rehab  Date 02/08/22  Educator [facility]  Instruction Review Code 1- Verbalizes Understanding       Falls Prevention: - Provides verbal and written material to individual with discussion of falls prevention and safety. Flowsheet Row Cardiac Rehab from 03/25/2022 in [facility] Cardiac and Pulmonary Rehab  Date 02/08/22  Educator [facility]  Instruction Review Code 1- Verbalizes Understanding       Other: -Provides group and verbal instruction on various topics (see comments)   Knowledge Questionnaire Score:  Knowledge Questionnaire Score - 02/08/22 1631       Knowledge Questionnaire Score   Pre Score 19/26             Core Components/Risk Factors/Patient Goals at Admission:  Personal  Goals and Risk Factors at Admission - 02/08/22 1628       Core Components/Risk Factors/Patient Goals on Admission    Weight Management Yes    Intervention Weight Management: Develop a combined nutrition and exercise program designed to reach desired caloric intake, while maintaining appropriate intake of nutrient and fiber, sodium and fats, and appropriate energy expenditure required for the weight goal.;Weight Management: Provide education and appropriate resources to help participant work on and attain dietary goals.;Weight Management/Obesity: Establish reasonable short term and long term weight goals.;Obesity: Provide education and appropriate resources to help participant work on and attain dietary goals.    Admit Weight 234 lb 1.6 oz (106.2 kg)    Goal Weight: Short Term 228 lb (103.4 kg)    Goal Weight: Long Term 200 lb (90.7 kg)    Expected Outcomes Short Term: Continue to assess and modify interventions until short term weight is achieved;Long Term: Adherence to nutrition and physical activity/exercise program aimed  toward attainment of established weight goal;Weight Loss: Understanding of general recommendations for a balanced deficit meal plan, which promotes 1-2 lb weight loss per week and includes a negative energy balance of [phone] kcal/d;Understanding recommendations for meals to include 15-35% energy as protein, 25-35% energy from fat, 35-60% energy from carbohydrates, less than 234m of dietary cholesterol, 20-35 gm of total fiber daily;Understanding of distribution of calorie intake throughout the day with the consumption of 4-5 meals/snacks    Diabetes Yes    Intervention Provide education about signs/symptoms and action to take for hypo/hyperglycemia.;Provide education about proper nutrition, including hydration, and aerobic/resistive exercise prescription along with prescribed medications to achieve blood glucose in normal ranges: Fasting glucose 65-99 mg/dL    Expected Outcomes  Short Term: Participant verbalizes understanding of the signs/symptoms and immediate care of hyper/hypoglycemia, proper foot care and importance of medication, aerobic/resistive exercise and nutrition plan for blood glucose control.;Long Term: Attainment of HbA1C < 7%.    Hypertension Yes    Intervention Provide education on lifestyle modifcations including regular physical activity/exercise, weight management, moderate sodium restriction and increased consumption of fresh fruit, vegetables, and low fat dairy, alcohol moderation, and smoking cessation.;Monitor prescription use compliance.    Expected Outcomes Short Term: Continued assessment and intervention until BP is < 140/938mHG in hypertensive participants. < 130/8021mG in hypertensive participants with diabetes, heart failure or chronic kidney disease.;Long Term: Maintenance of blood pressure at goal levels.    Lipids Yes    Intervention Provide education and support for participant on nutrition & aerobic/resistive exercise along with prescribed medications to achieve LDL <21m9mDL >40mg23m Expected Outcomes Short Term: Participant states understanding of desired cholesterol values and is compliant with medications prescribed. Participant is following exercise prescription and nutrition guidelines.;Long Term: Cholesterol controlled with medications as prescribed, with individualized exercise RX and with personalized nutrition plan. Value goals: LDL < 21mg,63m > 40 mg.             Education:Diabetes - Individual verbal and written instruction to review signs/symptoms of diabetes, desired ranges of glucose level fasting, after meals and with exercise. Acknowledge that pre and post exercise glucose checks will be done for 3 sessions at entry of program. [facility]  Education need identified 02/08/22       Core Components/Risk Factors/Patient Goals Review:   Goals and  Risk Factor Review     Row Name 05/04/22 1000             Core Components/Risk Factors/Patient Goals Review   Personal Goals Review Weight Management/Obesity;Hypertension;Diabetes       Review Katianne returned today after being out for over a month with a another heart event.  Her weight is up some since she started, but she has not been exercising.  Her pressures have been good and sugars are stable.  She hopes that getting back to exercise again will help with weight loss       Expected Outcomes Short; Reutrn to steady attendance in rehab Long: Contineu to monitor risk factors                Core Components/Risk Factors/Patient Goals at Discharge (Final Review):   Goals and Risk Factor Review - 05/04/22 1000       Core Components/Risk Factors/Patient Goals Review   Personal Goals Review Weight Management/Obesity;Hypertension;Diabetes    Review Alese returned today after being out for over a month with a another  heart event.  Her weight is up some since she started, but she has not been exercising.  Her pressures have been good and sugars are stable.  She hopes that getting back to exercise again will help with weight loss    Expected Outcomes Short; Reutrn to steady attendance in rehab Long: Contineu to monitor risk factors             ITP Comments:  ITP Comments     Row Name 01/19/22 0915 02/08/22 1611 02/10/22 1006 02/18/22 1001 03/10/22 1415   ITP Comments Virtual orientation call completed today. shehas an appointment on Date: 01/26/2022  for EP eval and gym Orientation.  Documentation of diagnosis [facility] be [facility] in [facility] Date: 12/24/2021 . Completed 6MWT and gym orientation. Initial ITP created and sent for review to Dr. Emily Filbert, Medical Director. 30 Day review completed. Medical Director ITP review done, changes made as directed, and signed approval by Medical Director.   NEW First full day of exercise!  Patient was oriented to gym and equipment including functions,  settings, policies, and procedures.  Patient's individual exercise prescription and treatment plan were reviewed.  All starting workloads were established based on the results of the 6 minute walk test done at initial orientation visit.  The plan for exercise progression was also introduced and progression will be customized based on patient's performance and goals. 30 Day review completed. Medical Director ITP review done, changes made as directed, and signed approval by Medical Director.   NEW    Row Name 03/11/22 1035 04/06/22 1050 04/07/22 0809 04/15/22 1513 04/26/22 1055   ITP Comments Zeya is using nitro sl at least 2 x a day for chest pain with exertion. Advised her to notify MD of this. Also gave her another med name  to review with her physician for angina control. Patient called to let us know she is getting admitted to the hospital for cardiac issues. Will place patient on medical hold from rehab and will need clearance for her to return. 30 Day review completed. Medical Director ITP review done, changes made as directed, and signed approval by Medical Director.   Out for medical reason Cortina called to let us know that she does not have a follow up appt until 11/22 from her recent hospitalization for MI.  She would like to finish the program. Staff spoke with Willadene to let her know she was cleared to come back to the program, per her Pulmonologist follow up appt. Patient plans to resume this week.    [place] Name 05/05/22 1039           ITP Comments 30 Day review completed. Medical Director ITP review done, changes made as directed, and signed approval by Medical Director.                Comments:

## 2022-05-06 ENCOUNTER — Other Ambulatory Visit: Payer: Self-pay | Admitting: Physician Assistant

## 2022-05-06 ENCOUNTER — Encounter: Payer: Medicare Other | Attending: Cardiology | Admitting: *Deleted

## 2022-05-06 ENCOUNTER — Encounter: Payer: Medicare (Managed Care) | Admitting: Physical Therapy

## 2022-05-06 DIAGNOSIS — Z48812 Encounter for surgical aftercare following surgery on the circulatory system: Secondary | ICD-10-CM | POA: Insufficient documentation

## 2022-05-06 DIAGNOSIS — I252 Old myocardial infarction: Secondary | ICD-10-CM | POA: Insufficient documentation

## 2022-05-06 DIAGNOSIS — I10 Essential (primary) hypertension: Secondary | ICD-10-CM

## 2022-05-06 DIAGNOSIS — Z955 Presence of coronary angioplasty implant and graft: Secondary | ICD-10-CM | POA: Insufficient documentation

## 2022-05-11 ENCOUNTER — Encounter: Payer: Medicare Other | Admitting: *Deleted

## 2022-05-11 DIAGNOSIS — I252 Old myocardial infarction: Secondary | ICD-10-CM | POA: Diagnosis not present

## 2022-05-11 DIAGNOSIS — Z955 Presence of coronary angioplasty implant and graft: Secondary | ICD-10-CM

## 2022-05-11 DIAGNOSIS — Z48812 Encounter for surgical aftercare following surgery on the circulatory system: Secondary | ICD-10-CM | POA: Diagnosis not present

## 2022-05-11 NOTE — Progress Notes (Signed)
Daily Session Note  Patient Details  Name: Felicia Woods MRN: [id number] Date of Birth: Dec 09, 1955 Referring Provider:   Flowsheet Row Cardiac Rehab from 02/08/2022 in [facility] Cardiac and Pulmonary Rehab  Referring Provider Corky Sox       Encounter Date: 05/11/2022  Check In:  Session Check In - 05/11/22 1042       Check-In   Supervising physician immediately available to respond to emergencies See telemetry face sheet for immediately available ER MD    Location [facility]-Cardiac & Pulmonary Rehab    Staff Present Heath Lark, RN, BSN, CCRP;Jessica [place], MA, RCEP, CCRP, Marylynn Pearson, MS, ASCM CEP, Exercise Physiologist    Virtual Visit No    Medication changes reported     No    Fall or balance concerns reported    No    Warm-up and Cool-down Performed on first and last piece of equipment    Resistance Training Performed Yes    VAD Patient? No    PAD/SET Patient? No      Pain Assessment   Currently in Pain? No/denies                Social History   Tobacco Use  Smoking Status Never  Smokeless Tobacco Never    Goals Met:  Independence with exercise equipment Exercise tolerated well No report of concerns or symptoms today  Goals Unmet:  Not Applicable  Comments: Pt able to follow exercise prescription today without complaint.  Will continue to monitor for progression.    Dr. Emily Filbert is Medical Director for [place].  Dr. Ottie Glazier is Medical Director for [facility] Pulmonary Rehabilitation.

## 2022-05-13 ENCOUNTER — Encounter: Payer: Medicare (Managed Care) | Admitting: Physical Therapy

## 2022-05-13 ENCOUNTER — Encounter: Payer: Medicare Other | Admitting: *Deleted

## 2022-05-13 DIAGNOSIS — Z955 Presence of coronary angioplasty implant and graft: Secondary | ICD-10-CM

## 2022-05-13 NOTE — Progress Notes (Signed)
Daily Session Note  Patient Details  Name: Felicia Woods MRN: [id number] Date of Birth: 07-17-1955 Referring Provider:   Flowsheet Row Cardiac Rehab from 02/08/2022 in [facility] Cardiac and Pulmonary Rehab  Referring Provider Corky Sox       Encounter Date: 05/13/2022  Check In:  Session Check In - 05/13/22 1046       Check-In   Supervising physician immediately available to respond to emergencies See telemetry face sheet for immediately available ER MD    Location [facility]-Cardiac & Pulmonary Rehab    Staff Present Coralie Keens, MS, ASCM CEP, Exercise Physiologist;Meredith Sherryll Burger, RN BSN;Joseph Rosebud Poles, RN, [place]    Virtual Visit No    Medication changes reported     No    Fall or balance concerns reported    Yes    Warm-up and Cool-down Performed on first and last piece of equipment    Resistance Training Performed Yes    VAD Patient? No    PAD/SET Patient? No      Pain Assessment   Currently in Pain? No/denies                Social History   Tobacco Use  Smoking Status Never  Smokeless Tobacco Never    Goals Met:  Independence with exercise equipment Exercise tolerated well No report of concerns or symptoms today Strength training completed today  Goals Unmet:  Not Applicable  Comments: Pt able to follow exercise prescription today without complaint.  Will continue to monitor for progression.    Dr. Emily Filbert is Medical Director for [place].  Dr. Ottie Glazier is Medical Director for [facility] Pulmonary Rehabilitation.

## 2022-05-18 ENCOUNTER — Encounter: Payer: Medicare Other | Admitting: *Deleted

## 2022-05-18 DIAGNOSIS — Z955 Presence of coronary angioplasty implant and graft: Secondary | ICD-10-CM | POA: Diagnosis not present

## 2022-05-18 NOTE — Progress Notes (Signed)
Daily Session Note  Patient Details  Name: Felicia Woods MRN: [id number] Date of Birth: [birth year] Referring Provider:   Flowsheet Row Cardiac Rehab from 02/08/2022 in [facility] Cardiac and Pulmonary Rehab  Referring Provider Corky Sox       Encounter Date: 05/18/2022  Check In:  Session Check In - 05/18/22 1046       Check-In   Supervising physician immediately available to respond to emergencies See telemetry face sheet for immediately available ER MD    Location [facility]-Cardiac & Pulmonary Rehab    Staff Present Heath Lark, RN, BSN, CCRP;Jessica Loop, MA, RCEP, CCRP, Marylynn Pearson, MS, ASCM CEP, Exercise Physiologist    Virtual Visit No    Medication changes reported     No    Fall or balance concerns reported    No    Warm-up and Cool-down Performed on first and last piece of equipment    Resistance Training Performed Yes    VAD Patient? No    PAD/SET Patient? No      Pain Assessment   Currently in Pain? No/denies                Social History   Tobacco Use  Smoking Status Never  Smokeless Tobacco Never    Goals Met:  Independence with exercise equipment Exercise tolerated well No report of concerns or symptoms today  Goals Unmet:  Not Applicable  Comments: Pt able to follow exercise prescription today without complaint.  Will continue to monitor for progression.    Dr. Emily Filbert is Medical Director for [place].  Dr. Ottie Glazier is Medical Director for [facility] Pulmonary Rehabilitation.

## 2022-05-20 ENCOUNTER — Encounter: Payer: Self-pay | Admitting: Physician Assistant

## 2022-05-20 ENCOUNTER — Encounter: Payer: Medicare (Managed Care) | Admitting: Physical Therapy

## 2022-05-20 ENCOUNTER — Telehealth: Payer: Self-pay

## 2022-05-20 ENCOUNTER — Encounter: Payer: Medicare Other | Admitting: *Deleted

## 2022-05-20 ENCOUNTER — Ambulatory Visit (INDEPENDENT_AMBULATORY_CARE_PROVIDER_SITE_OTHER): Payer: Medicare Other | Admitting: Physician Assistant

## 2022-05-20 VITALS — BP 138/80 | HR 86 | Temp 98.2°F | Resp 16 | Ht 67.0 in | Wt 241.6 lb

## 2022-05-20 DIAGNOSIS — E1159 Type 2 diabetes mellitus with other circulatory complications: Secondary | ICD-10-CM | POA: Diagnosis not present

## 2022-05-20 DIAGNOSIS — J441 Chronic obstructive pulmonary disease with (acute) exacerbation: Secondary | ICD-10-CM | POA: Diagnosis not present

## 2022-05-20 DIAGNOSIS — I10 Essential (primary) hypertension: Secondary | ICD-10-CM

## 2022-05-20 MED ORDER — HYDROCOD POLI-CHLORPHE POLI ER 10-8 MG/5ML PO SUER: 5.0000 mL | Freq: Two times a day (BID) | ORAL | 0 refills | Status: DC | PRN

## 2022-05-20 MED ORDER — AZITHROMYCIN 250 MG PO TABS: ORAL_TABLET | ORAL | 0 refills | Status: DC

## 2022-05-20 NOTE — Progress Notes (Signed)
[facility] [place], [place] [address]  Internal MEDICINE  Office Visit Note  Patient Name: Felicia Woods  [id number]  [id number]  Date of Service: 05/20/2022  Chief Complaint  Patient presents with   Follow-up   Diabetes   Hypertension   Hyperlipidemia   Quality Metric Gaps    Dexa Scan    HPI Pt is here for routine follow up and sick visit -Started coughing 2 days ago. Chest tightness with the coughing and fatigued. Sinus congestion. -She does have COPD and may be having an exacerbation.  -Uses trelegy daily and has not needed albuterol yet but has it if needed -Will start mucinex and nasal spray -BP at home 383 systolic -BG at home [id number] fasting, fluctuates -Had PNA shot last week, has upcoming appt for shingles and RSV -She had been doing better until she started getting sick 2 days ago -She continues to do cardiac rehab following her coronary artery stent placement  Current Medication: Outpatient Encounter Medications as of 05/20/2022  Medication Sig   Accu-Chek Softclix Lancets lancets Use as instructed twice a day to check blood sugars  DIAG -E11.59   albuterol (VENTOLIN HFA) 108 (90 Base) MCG/ACT inhaler Inhale 2 puffs into the lungs every 6 (six) hours as needed for wheezing or shortness of breath.   amLODipine (NORVASC) 10 MG tablet Take 1 tablet by mouth daily.   aspirin EC 81 MG tablet Take 81 mg by mouth daily.   atorvastatin (LIPITOR) 40 MG tablet Take 1 tablet (40 mg total) by mouth daily.   azithromycin (ZITHROMAX) 250 MG tablet Take one tab a day for 10 days for uri   chlorpheniramine-HYDROcodone (TUSSIONEX) 10-8 MG/5ML Take 5 mLs by mouth every 12 (twelve) hours as needed for cough.   clopidogrel (PLAVIX) 75 MG tablet Take 1 tablet by mouth daily.   cyclobenzaprine (FLEXERIL) 10 MG tablet Take 1 tablet by mouth at bedtime.   Dulaglutide (TRULICITY) 1.5 MA/0.0KH SOPN Inject 1.5 mg into the skin once a week.   empagliflozin  (JARDIANCE) 25 MG TABS tablet Take one tab a day for diabetes   famotidine (PEPCID) 20 MG tablet Take 1 tablet by mouth 2 (two) times daily.   Fluticasone-Umeclidin-Vilant (TRELEGY ELLIPTA) 100-62.5-25 MCG/ACT AEPB INHALE 1 PUFF INTO THE LUNGS ONCE DAILY   furosemide (LASIX) 40 MG tablet Take 1 tablet (40 mg total) by mouth 2 (two) times daily.   glucose blood (ACCU-CHEK GUIDE) test strip Use as instructed to check blood sugars twice a day  E11.59   isosorbide mononitrate (IMDUR) 60 MG 24 hr tablet Take one tab po qhs for heart   losartan (COZAAR) 100 MG tablet Take 1 tablet by mouth daily.   metoprolol succinate (TOPROL-XL) 25 MG 24 hr tablet Take 1 tablet (25 mg total) by mouth daily.   Misc. Devices (ADJUST BATH/SHOWER SEAT) MISC 1 shower seat for use at home to reduce risk of falling.   montelukast (SINGULAIR) 10 MG tablet Take 1 tablet by mouth at bedtime.   nitroGLYCERIN (NITROSTAT) 0.4 MG SL tablet DISSOLVE ONE TABLET UNDER THE TONGUE EVERY 5 MINUTES AS NEEDED FOR CHEST PAIN.  DO NOT EXCEED A TOTAL OF 3 DOSES IN 15 MINUTES   oxybutynin (DITROPAN) 5 MG tablet Take 1 tablet by mouth daily.   ranolazine (RANEXA) 1000 MG SR tablet Take 1 tablet (1,000 mg total) by mouth 2 (two) times daily.   zolpidem (AMBIEN) 5 MG tablet Take 1 tablet (5 mg total) by mouth at  bedtime as needed. for sleep   No facility-administered encounter medications on file as of 05/20/2022.    Surgical History: Past Surgical History:  Procedure Laterality Date   ABDOMINAL HYSTERECTOMY     CARDIAC CATHETERIZATION     CARDIAC CATHETERIZATION N/A 10/18/2015   Procedure: Left Heart Cath and Cors/Grafts Angiography;  Surgeon: Lorretta Harp, MD;  Location: [place] [facility];  Service: Cardiovascular;  Laterality: N/A;   CARDIAC CATHETERIZATION N/A 10/18/2015   Procedure: Coronary Stent Intervention;  Surgeon: Lorretta Harp, MD;  Location: [place] [facility];  Service: Cardiovascular;  Laterality: N/A;   CARDIAC  SURGERY     CHOLECYSTECTOMY     COLONOSCOPY WITH PROPOFOL N/A 12/02/2021   Procedure: COLONOSCOPY WITH PROPOFOL;  Surgeon: Jonathon Bellows, MD;  Location: [facility] ENDOSCOPY;  Service: Gastroenterology;  Laterality: N/A;   CORONARY ANGIOPLASTY     CORONARY ARTERY BYPASS GRAFT     4 vessels - 2010   CORONARY STENT INTERVENTION N/A 02/16/2017   Procedure: CORONARY STENT INTERVENTION;  Surgeon: Yolonda Kida, MD;  Location: [place] [facility];  Service: Cardiovascular;  Laterality: N/A;   CORONARY STENT INTERVENTION N/A 02/13/2019   Procedure: CORONARY STENT INTERVENTION;  Surgeon: Nelva Bush, MD;  Location: [place] [facility];  Service: Cardiovascular;  Laterality: N/A;  SVG to RCA   CORONARY STENT INTERVENTION Left 03/08/2019   Procedure: CORONARY STENT INTERVENTION;  Surgeon: Yolonda Kida, MD;  Location: [place] [facility];  Service: Cardiovascular;  Laterality: Left;   CORONARY STENT INTERVENTION N/A 11/04/2020   Procedure: CORONARY STENT INTERVENTION;  Surgeon: Nelva Bush, MD;  Location: [place] [facility];  Service: Cardiovascular;  Laterality: N/A;   CORONARY STENT INTERVENTION N/A 12/24/2021   Procedure: CORONARY STENT INTERVENTION;  Surgeon: Andrez Grime, MD;  Location: [place] [facility];  Service: Cardiovascular;  Laterality: N/A;   ESOPHAGOGASTRODUODENOSCOPY N/A 12/02/2021   Procedure: ESOPHAGOGASTRODUODENOSCOPY (EGD);  Surgeon: Jonathon Bellows, MD;  Location: [facility] ENDOSCOPY;  Service: Gastroenterology;  Laterality: N/A;   LEFT HEART CATH AND CORONARY ANGIOGRAPHY Left 02/16/2017   Procedure: LEFT HEART CATH AND CORONARY ANGIOGRAPHY;  Surgeon: Corey Skains, MD;  Location: [place] [facility];  Service: Cardiovascular;  Laterality: Left;   LEFT HEART CATH AND CORONARY ANGIOGRAPHY Left 12/24/2021   Procedure: LEFT HEART CATH AND CORONARY ANGIOGRAPHY;  Surgeon: Corey Skains, MD;  Location: [place] [facility];  Service: Cardiovascular;  Laterality: Left;    LEFT HEART CATH AND CORS/GRAFTS ANGIOGRAPHY Left 11/23/2017   Procedure: LEFT HEART CATH AND CORS/GRAFTS ANGIOGRAPHY;  Surgeon: Corey Skains, MD;  Location: [place] [facility];  Service: Cardiovascular;  Laterality: Left;   LEFT HEART CATH AND CORS/GRAFTS ANGIOGRAPHY N/A 02/13/2019   Procedure: LEFT HEART CATH AND CORS/GRAFTS ANGIOGRAPHY;  Surgeon: Corey Skains, MD;  Location: [place] [facility];  Service: Cardiovascular;  Laterality: N/A;   LEFT HEART CATH AND CORS/GRAFTS ANGIOGRAPHY N/A 07/03/2019   Procedure: LEFT HEART CATH AND CORS/GRAFTS ANGIOGRAPHY;  Surgeon: Corey Skains, MD;  Location: [place] [facility];  Service: Cardiovascular;  Laterality: N/A;   LEFT HEART CATH AND CORS/GRAFTS ANGIOGRAPHY N/A 11/04/2020   Procedure: LEFT HEART CATH AND CORS/GRAFTS ANGIOGRAPHY;  Surgeon: Corey Skains, MD;  Location: [place] [facility];  Service: Cardiovascular;  Laterality: N/A;    Medical History: Past Medical History:  Diagnosis Date   Asthma    Coronary artery disease    Diabetes mellitus without complication ([place])    Heart  attack ([place])    Hyperlipidemia    Hypertension    MI (myocardial infarction) ([place])    Migraine headache with aura    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    Family History: Family History  Problem Relation Age of Onset   Diabetes Mother    Diabetes Father    Cancer Father    Diabetes Brother     Social History   Socioeconomic History   Marital status: Widowed    Spouse name: Jeneen Rinks   Number of children: Not on file   Years of education: Not on file   Highest education level: Not on file  Occupational History   Not on file  Tobacco Use   Smoking status: Never   Smokeless tobacco: Never  Vaping Use   Vaping Use: Never used  Substance and Sexual Activity   Alcohol use: No    Alcohol/week: 0.0 standard drinks of alcohol   Drug use: No   Sexual activity: Yes  Other Topics Concern   Not on file  Social History Narrative   Not  on file   Social Determinants of Health   Financial Resource Strain: Not on file  Food Insecurity: Not on file  Transportation Needs: Not on file  Physical Activity: Not on file  Stress: Not on file  Social Connections: Not on file  Intimate Partner Violence: Not on file      Review of Systems  Constitutional:  Positive for fatigue. Negative for chills and unexpected weight change.  HENT:  Positive for congestion and postnasal drip. Negative for rhinorrhea, sneezing and sore throat.   Eyes:  Negative for redness.  Respiratory:  Positive for cough and chest tightness. Negative for shortness of breath and wheezing.   Cardiovascular:  Negative for chest pain and palpitations.  Gastrointestinal:  Negative for abdominal pain, constipation, diarrhea, nausea and vomiting.  Genitourinary:  Negative for dysuria and frequency.  Musculoskeletal:  Negative for arthralgias, back pain, joint swelling and neck pain.  Skin:  Negative for rash.  Neurological: Negative.  Negative for tremors and numbness.  Hematological:  Negative for adenopathy. Does not bruise/bleed easily.  Psychiatric/Behavioral:  Negative for behavioral problems (Depression), sleep disturbance and suicidal ideas. The patient is not nervous/anxious.     Vital Signs: BP 138/80 Comment: 155/87  Pulse 86   Temp 98.2 F (36.8 C)   Resp 16   Ht 5' 7" (1.702 m)   Wt 241 lb 9.6 oz (109.6 kg)   SpO2 97%   BMI 37.84 kg/m    Physical Exam Vitals and nursing note reviewed.  Constitutional:      Appearance: Normal appearance. She is obese.  HENT:     Head: Normocephalic and atraumatic.     Nose: Congestion present.     Mouth/Throat:     Mouth: Mucous membranes are moist.     Pharynx: No posterior oropharyngeal erythema.  Eyes:     Extraocular Movements: Extraocular movements intact.     Pupils: Pupils are equal, round, and reactive to light.  Cardiovascular:     Rate and Rhythm: Normal rate and regular rhythm.      Pulses: Normal pulses.     Heart sounds: Normal heart sounds.  Pulmonary:     Effort: Pulmonary effort is normal.     Breath sounds: Normal breath sounds.  Skin:    General: Skin is warm and dry.  Neurological:     General: No focal deficit present.     Mental Status:  She is alert.  Psychiatric:        Mood and Affect: Mood normal.        Behavior: Behavior normal.        Assessment/Plan: 1. Chronic obstructive pulmonary disease with acute exacerbation (HCC) Will start on zpak and may take tussionex as needed though cautioned on use and side effects and advised to use OTC delsym instead if able. Continue inhalers as prescribed. Also advised to rest and start mucinex and flonase to help congestion. - azithromycin (ZITHROMAX) 250 MG tablet; Take one tab a day for 10 days for uri  Dispense: 10 tablet; Refill: 0 - chlorpheniramine-HYDROcodone (TUSSIONEX) 10-8 MG/5ML; Take 5 mLs by mouth every 12 (twelve) hours as needed for cough.  Dispense: 70 mL; Refill: 0  2. Type 2 diabetes mellitus with other circulatory complication, without long-term current use of insulin (HCC) Sugars still fluctuating, taking her medication regularly and will continue to monitor.. May need to adjust trulicity in future if J8A not improved  3. Essential hypertension Stable, continue current medications   General Counseling: Tykeshia verbalizes understanding of the findings of todays visit and agrees with plan of treatment. I have discussed any further diagnostic evaluation that may be needed or ordered today. We also reviewed her medications today. she has been encouraged to call the office with any questions or concerns that should arise related to todays visit.    No orders of the defined types were placed in this encounter.   Meds ordered this encounter  Medications   azithromycin (ZITHROMAX) 250 MG tablet    Sig: Take one tab a day for 10 days for uri    Dispense:  10 tablet    Refill:  0    chlorpheniramine-HYDROcodone (TUSSIONEX) 10-8 MG/5ML    Sig: Take 5 mLs by mouth every 12 (twelve) hours as needed for cough.    Dispense:  70 mL    Refill:  0    This patient was seen by Drema Dallas, PA-C in collaboration with Dr. Clayborn Bigness as a part of collaborative care agreement.   Total time spent:30 Minutes Time spent includes review of chart, medications, test results, and follow up plan with the patient.      Dr Lavera Guise Internal medicine

## 2022-05-25 ENCOUNTER — Encounter: Payer: Medicare (Managed Care) | Admitting: Physical Therapy

## 2022-05-31 ENCOUNTER — Other Ambulatory Visit
Admission: RE | Admit: 2022-05-31 | Discharge: 2022-05-31 | Disposition: A | Discharge status: Home / Self Care | Payer: Medicare Other | Source: Ambulatory Visit | Attending: Internal Medicine | Admitting: Internal Medicine

## 2022-05-31 DIAGNOSIS — I25810 Atherosclerosis of coronary artery bypass graft(s) without angina pectoris: Secondary | ICD-10-CM | POA: Diagnosis present

## 2022-05-31 DIAGNOSIS — Z01812 Encounter for preprocedural laboratory examination: Secondary | ICD-10-CM | POA: Insufficient documentation

## 2022-05-31 DIAGNOSIS — I2089 Other forms of angina pectoris: Secondary | ICD-10-CM | POA: Diagnosis present

## 2022-05-31 LAB — BRAIN NATRIURETIC PEPTIDE: B Natriuretic Peptide: 85.1 pg/mL (ref 0.0–100.0)

## 2022-05-31 NOTE — Telephone Encounter (Signed)
done

## 2022-06-02 ENCOUNTER — Encounter: Payer: Self-pay | Admitting: *Deleted

## 2022-06-02 DIAGNOSIS — Z955 Presence of coronary angioplasty implant and graft: Secondary | ICD-10-CM

## 2022-06-02 NOTE — Progress Notes (Signed)
Cardiac Individual Treatment Plan  Patient Details  Name: Felicia Woods MRN: [id number] Date of Birth: 1956-01-05 Referring Provider:   Flowsheet Row Cardiac Rehab from 02/08/2022 in [facility] Cardiac and Pulmonary Rehab  Referring Provider Corky Sox       Initial Encounter Date:  Flowsheet Row Cardiac Rehab from 02/08/2022 in [facility] Cardiac and Pulmonary Rehab  Date 02/08/22       Visit Diagnosis: S/P coronary artery stent placement  Patient's Home Medications on Admission:  Current Outpatient Medications:    Accu-Chek Softclix Lancets lancets, Use as instructed twice a day to check blood sugars  DIAG -E11.59, Disp: 100 each, Rfl: 3   albuterol (VENTOLIN HFA) 108 (90 Base) MCG/ACT inhaler, Inhale 2 puffs into the lungs every 6 (six) hours as needed for wheezing or shortness of breath., Disp: 8 g, Rfl: 6   amLODipine (NORVASC) 10 MG tablet, Take 1 tablet by mouth daily., Disp: , Rfl:    aspirin EC 81 MG tablet, Take 81 mg by mouth daily., Disp: , Rfl:    atorvastatin (LIPITOR) 40 MG tablet, Take 1 tablet (40 mg total) by mouth daily., Disp: 90 tablet, Rfl: 3   azithromycin (ZITHROMAX) 250 MG tablet, Take one tab a day for 10 days for uri, Disp: 10 tablet, Rfl: 0   chlorpheniramine-HYDROcodone (TUSSIONEX) 10-8 MG/5ML, Take 5 mLs by mouth every 12 (twelve) hours as needed for cough., Disp: 70 mL, Rfl: 0   clopidogrel (PLAVIX) 75 MG tablet, Take 1 tablet by mouth daily., Disp: , Rfl:    cyclobenzaprine (FLEXERIL) 10 MG tablet, Take 1 tablet by mouth at bedtime., Disp: , Rfl:    Dulaglutide (TRULICITY) 1.5 DJ/4.9FW SOPN, Inject 1.5 mg into the skin once a week., Disp: 6 mL, Rfl: 1   empagliflozin (JARDIANCE) 25 MG TABS tablet, Take one tab a day for diabetes, Disp: 90 tablet, Rfl: 3   famotidine (PEPCID) 20 MG tablet, Take 1 tablet by mouth 2 (two) times daily., Disp: , Rfl:    Fluticasone-Umeclidin-Vilant (TRELEGY ELLIPTA) 100-62.5-25 MCG/ACT AEPB, INHALE 1 PUFF INTO THE LUNGS ONCE DAILY, Disp: ,  Rfl:    furosemide (LASIX) 40 MG tablet, Take 1 tablet (40 mg total) by mouth 2 (two) times daily., Disp: 180 tablet, Rfl: 1   glucose blood (ACCU-CHEK GUIDE) test strip, Use as instructed to check blood sugars twice a day  E11.59, Disp: 100 each, Rfl: 3   isosorbide mononitrate (IMDUR) 60 MG 24 hr tablet, Take one tab po qhs for heart, Disp: 90 tablet, Rfl: 1   losartan (COZAAR) 100 MG tablet, Take 1 tablet by mouth daily., Disp: , Rfl:    metoprolol succinate (TOPROL-XL) 25 MG 24 hr tablet, Take 1 tablet (25 mg total) by mouth daily., Disp: 90 tablet, Rfl: 1   Misc. Devices (ADJUST BATH/SHOWER SEAT) MISC, 1 shower seat for use at home to reduce risk of falling., Disp: 1 each, Rfl: 0   montelukast (SINGULAIR) 10 MG tablet, Take 1 tablet by mouth at bedtime., Disp: , Rfl:    nitroGLYCERIN (NITROSTAT) 0.4 MG SL tablet, DISSOLVE ONE TABLET UNDER THE TONGUE EVERY 5 MINUTES AS NEEDED FOR CHEST PAIN.  DO NOT EXCEED A TOTAL OF 3 DOSES IN 15 MINUTES, Disp: 30 tablet, Rfl: 3   oxybutynin (DITROPAN) 5 MG tablet, Take 1 tablet by mouth daily., Disp: , Rfl:    ranolazine (RANEXA) 1000 MG SR tablet, Take 1 tablet (1,000 mg total) by mouth 2 (two) times daily., Disp: 60 tablet, Rfl: 1   zolpidem (  AMBIEN) 5 MG tablet, Take 1 tablet (5 mg total) by mouth at bedtime as needed. for sleep, Disp: 30 tablet, Rfl: 0  Past Medical History: Past Medical History:  Diagnosis Date   Asthma    Coronary artery disease    Diabetes mellitus without complication ([place])    Heart attack ([place])    Hyperlipidemia    Hypertension    MI (myocardial infarction) ([place])    Migraine headache with aura    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    Tobacco Use: Social History   Tobacco Use  Smoking Status Never  Smokeless Tobacco Never    Labs: Review Flowsheet  More data exists      Latest Ref Rng & Units 06/10/2021 09/03/2021 09/11/2021 12/03/2021 04/20/2022  Labs for ITP Cardiac and Pulmonary Rehab  Cholestrol 0 - 200 mg/dL 181   - 243  - -  LDL (calc) 0 - 99 mg/dL 125  - 181  - -  HDL-C >40 mg/dL 32  - 50  - -  Trlycerides <150 mg/dL 121  - 62  - -  Hemoglobin A1c 4.0 - 5.6 % 8.2  7.1  - 7.2  8.3      Exercise Target Goals: Exercise Program Goal: Individual exercise prescription set using results from initial 6 min walk test and THRR while considering  patient's activity barriers and safety.   Exercise Prescription Goal: Initial exercise prescription builds to 30-45 minutes a day of aerobic activity, 2-3 days per week.  Home exercise guidelines will be given to patient during program as part of exercise prescription that the participant will acknowledge.   Education: Aerobic Exercise: - Group verbal and visual presentation on the components of exercise prescription. Introduces F.I.T.T principle from ACSM for exercise prescriptions.  Reviews F.I.T.T. principles of aerobic exercise including progression. Written material given at graduation. Flowsheet Row Cardiac Rehab from 03/25/2022 in [facility] Cardiac and Pulmonary Rehab  Education need identified 02/08/22       Education: Resistance Exercise: - Group verbal and visual presentation on the components of exercise prescription. Introduces F.I.T.T principle from ACSM for exercise prescriptions  Reviews F.I.T.T. principles of resistance exercise including progression. Written material given at graduation.    Education: Exercise & Equipment Safety: - Individual verbal instruction and demonstration of equipment use and safety with use of the equipment. Flowsheet Row Cardiac Rehab from 03/25/2022 in [facility] Cardiac and Pulmonary Rehab  Date 02/08/22  Educator [facility]  Instruction Review Code 1- Verbalizes Understanding       Education: Exercise Physiology & General Exercise Guidelines: - Group verbal and written instruction with models to review the exercise physiology of the cardiovascular system and associated critical values. Provides general exercise guidelines with  specific guidelines to those with heart or lung disease.    Education: Flexibility, Balance, Mind/Body Relaxation: - Group verbal and visual presentation with interactive activity on the components of exercise prescription. Introduces F.I.T.T principle from ACSM for exercise prescriptions. Reviews F.I.T.T. principles of flexibility and balance exercise training including progression. Also discusses the mind body connection.  Reviews various relaxation techniques to help reduce and manage stress (i.e. Deep breathing, progressive muscle relaxation, and visualization). Balance handout provided to take home. Written material given at graduation.   Activity Barriers & Risk Stratification:  Activity Barriers & Cardiac Risk Stratification - 01/19/22 0907       Activity Barriers & Cardiac Risk Stratification   Activity Barriers Arthritis;Joint Problems;Shortness of Breath    Cardiac Risk Stratification High  6 Minute Walk:  6 Minute Walk     Row Name 02/08/22 1612         6 Minute Walk   Phase Initial     Distance 665 feet     Walk Time 5.5 minutes     # of Rest Breaks 1     MPH 1.38     METS 1.65     RPE 12     Perceived Dyspnea  1     VO2 Peak 5.77     Symptoms Yes (comment)     Comments Chest pain 7/10 during walk. Relieved with rest and then resumed walk with no chest pain.     Resting HR 84 bpm     Resting BP 110/60     Resting Oxygen Saturation  97 %     Exercise Oxygen Saturation  during 6 min walk 97 %     Max Ex. HR 108 bpm     Max Ex. BP 138/60     2 Minute Post BP 118/78              Oxygen Initial Assessment:   Oxygen Re-Evaluation:   Oxygen Discharge (Final Oxygen Re-Evaluation):   Initial Exercise Prescription:  Initial Exercise Prescription - 02/08/22 1600       Date of Initial Exercise RX and Referring Provider   Date 02/08/22    Referring Provider Corky Sox      Oxygen   Maintain Oxygen Saturation 88% or higher      NuStep    Level 1    SPM 80    Minutes 15    METs 1.65      Arm Ergometer   Level 1    RPM 30    Minutes 15    METs 1.65      REL-XR   Level 1    Speed 50    Minutes 15    METs 1.65      Biostep-RELP   Level 1    SPM 50    Minutes 15    METs 1.65      Track   Laps 7    Minutes 15    METs 1.38      Intensity   THRR 40-80% of Max Heartrate 112-141    Ratings of Perceived Exertion 11-13    Perceived Dyspnea 0-4      Progression   Progression Continue to progress workloads to maintain intensity without signs/symptoms of physical distress.      Resistance Training   Training Prescription Yes    Weight 3    Reps 10-15             Perform Capillary Blood Glucose checks as needed.  Exercise Prescription Changes:   Exercise Prescription Changes     Row Name 02/08/22 1600 03/02/22 1500 03/18/22 1700 03/29/22 1400 05/12/22 1600     Response to Exercise   Blood Pressure (Admit) 110/60 124/82 122/56 128/72 122/62   Blood Pressure (Exercise) 138/60 134/70 -- -- 116/68   Blood Pressure (Exit) 118/78 120/80 122/64 104/72 106/68   Heart Rate (Admit) 84 bpm 83 bpm 98 bpm 99 bpm 90 bpm   Heart Rate (Exercise) 108 bpm 131 bpm 132 bpm 127 bpm 120 bpm   Heart Rate (Exit) 85 bpm 86 bpm 95 bpm 97 bpm 86 bpm   Oxygen Saturation (Admit) 97 % -- -- -- --   Oxygen Saturation (Exercise) 97 % -- -- -- --  Oxygen Saturation (Exit) 97 % -- -- -- --   Rating of Perceived Exertion (Exercise) _0 Perceived Dyspnea (Exercise) 1 -- -- -- --   Symptoms Chest pain 7/10 relieved with rest. Resumed walk test and finished with no more pain. knee pain, fatigue none none none   Comments 6MWT results 2nd full day of exercise -- -- --   Duration -- Progress to 30 minutes of  aerobic without signs/symptoms of physical distress Continue with 30 min of aerobic exercise without signs/symptoms of physical distress. Continue with 30 min of aerobic exercise without signs/symptoms of physical  distress. Continue with 30 min of aerobic exercise without signs/symptoms of physical distress.   Intensity -- THRR unchanged THRR unchanged THRR unchanged THRR unchanged     Progression   Progression -- Continue to progress workloads to maintain intensity without signs/symptoms of physical distress. Continue to progress workloads to maintain intensity without signs/symptoms of physical distress. Continue to progress workloads to maintain intensity without signs/symptoms of physical distress. Continue to progress workloads to maintain intensity without signs/symptoms of physical distress.   Average METs -- 2.19 2.81 2.71 1.73     Resistance Training   Training Prescription -- Yes Yes Yes Yes   Weight -- 3 lb 3 lb 3 lb 3 lb   Reps -- 10-15 10-15 10-15 10-15     Interval Training   Interval Training -- No No No No     Arm Ergometer   Level -- _1 Minutes -- _2 METs -- 2.4 2.3 2.5 1.8     REL-XR   Level -- -- 3 2 --   Minutes -- -- 15 15 --   METs -- -- -- 4 --     Track   Laps -- _3 Minutes -- _4 METs -- 2.09 2.14 1.65 1.65     Oxygen   Maintain Oxygen Saturation -- -- 88% or higher 88% or higher 88% or higher    Row Name 05/25/22 1500             Response to Exercise   Blood Pressure (Admit) 122/70       Blood Pressure (Exercise) 128/64       Blood Pressure (Exit) 112/70       Heart Rate (Admit) 92 bpm       Heart Rate (Exercise) 134 bpm       Heart Rate (Exit) 92 bpm       Rating of Perceived Exertion (Exercise) 13       Symptoms none       Duration Continue with 30 min of aerobic exercise without signs/symptoms of physical distress.       Intensity THRR unchanged         Progression   Progression Continue to progress workloads to maintain intensity without signs/symptoms of physical distress.       Average METs 1.8         Resistance Training   Training Prescription Yes       Weight 3 lb       Reps 10-15          Interval Training   Interval Training No         NuStep   Level 2       Minutes 15         REL-XR  Level 2       Minutes 15         Track   Laps 22       Minutes 15       METs 2.2         Oxygen   Maintain Oxygen Saturation 88% or higher                Exercise Comments:   Exercise Comments     Row Name 02/18/22 1001 03/11/22 1036         Exercise Comments First full day of exercise!  Patient was oriented to gym and equipment including functions, settings, policies, and procedures.  Patient's individual exercise prescription and treatment plan were reviewed.  All starting workloads were established based on the results of the 6 minute walk test done at initial orientation visit.  The plan for exercise progression was also introduced and progression will be customized based on patient's performance and goals. Felicia Woods is using nitro sl at least 2 x a day for chest pain with exertion. Advised her to notify MD of this. Also gave her another med name  to review with her physician for angina control.               Exercise Goals and Review:   Exercise Goals     Row Name 02/08/22 1624             Exercise Goals   Increase Physical Activity Yes       Intervention Provide advice, education, support and counseling about physical activity/exercise needs.;Develop an individualized exercise prescription for aerobic and resistive training based on initial evaluation findings, risk stratification, comorbidities and participant's personal goals.       Expected Outcomes Short Term: Attend rehab on a regular basis to increase amount of physical activity.;Long Term: Add in home exercise to make exercise part of routine and to increase amount of physical activity.;Long Term: Exercising regularly at least 3-5 days a week.       Increase Strength and Stamina Yes       Intervention Provide advice, education, support and counseling about physical activity/exercise needs.;Develop an  individualized exercise prescription for aerobic and resistive training based on initial evaluation findings, risk stratification, comorbidities and participant's personal goals.       Expected Outcomes Short Term: Increase workloads from initial exercise prescription for resistance, speed, and METs.;Short Term: Perform resistance training exercises routinely during rehab and add in resistance training at home;Long Term: Improve cardiorespiratory fitness, muscular endurance and strength as measured by increased METs and functional capacity (6MWT)       Able to understand and use rate of perceived exertion (RPE) scale Yes       Intervention Provide education and explanation on how to use RPE scale       Expected Outcomes Short Term: Able to use RPE daily in rehab to express subjective intensity level;Long Term:  Able to use RPE to guide intensity level when exercising independently       Able to understand and use Dyspnea scale Yes       Intervention Provide education and explanation on how to use Dyspnea scale       Expected Outcomes Short Term: Able to use Dyspnea scale daily in rehab to express subjective sense of shortness of breath during exertion;Long Term: Able to use Dyspnea scale to guide intensity level when exercising independently       Knowledge and understanding of Target Heart Rate  Range (THRR) Yes       Intervention Provide education and explanation of THRR including how the numbers were predicted and where they are located for reference       Expected Outcomes Short Term: Able to state/look up THRR;Short Term: Able to use daily as guideline for intensity in rehab;Long Term: Able to use THRR to govern intensity when exercising independently       Able to check pulse independently Yes       Intervention Provide education and demonstration on how to check pulse in carotid and radial arteries.;Review the importance of being able to check your own pulse for safety during independent exercise        Expected Outcomes Short Term: Able to explain why pulse checking is important during independent exercise;Long Term: Able to check pulse independently and accurately       Understanding of Exercise Prescription Yes       Intervention Provide education, explanation, and written materials on patient's individual exercise prescription       Expected Outcomes Short Term: Able to explain program exercise prescription;Long Term: Able to explain home exercise prescription to exercise independently                Exercise Goals Re-Evaluation :  Exercise Goals Re-Evaluation     Row Name 02/18/22 1002 03/02/22 1521 03/18/22 1749 03/29/22 1404 04/13/22 1513     Exercise Goal Re-Evaluation   Exercise Goals Review Able to understand and use rate of perceived exertion (RPE) scale;Knowledge and understanding of Target Heart Rate Range (THRR);Able to understand and use Dyspnea scale;Able to check pulse independently;Understanding of Exercise Prescription Increase Physical Activity;Increase Strength and Stamina;Understanding of Exercise Prescription Increase Physical Activity;Increase Strength and Stamina;Understanding of Exercise Prescription Increase Physical Activity;Increase Strength and Stamina;Understanding of Exercise Prescription Increase Physical Activity;Increase Strength and Stamina;Understanding of Exercise Prescription   Comments Reviewed RPE and dyspnea scales, THR and program prescription with pt today.  Pt voiced understanding and was given a copy of goals to take home. Felicia Woods is off to a good start in rehab. She has only worked on the arm ergometer and track so far, however, was able to complete full 20 laps on the track! Her HR reached her THR. We will continue to monitor her progress as she progresses in the program. Felicia Woods is doing well in rehab. She recently improved her average overall MET level to 2.81 METs. She also improved to level 3 on the XR and got up to 21 laps on the track. We  will continue to monitor her progress. Felicia Woods is doing well in rehab. She has done as many as 21 laps, but is now back down to 12 laps.  She is on level 2 for the XR.  We will talk to her about getting consistent with workloads to see improvement. Felicia Woods had another MI and was in the hospital from 10/03-10/05. We will reach out to her and see if she wants to get clearance from her doctor or get a new referral.   Expected Outcomes Short: Use RPE daily to regulate intensity. Long: Follow program prescription in THR. Short: Continue to work up laps on the track Long: Work on increasing overall MET level Short: Continue to increase workloads and laps on the track. Long: Continue to increase strength and stamina. Short: Attend rehab consistently and keep workloads consistent  Long: Cointinue to improve stamina Short: Return to rehab. Long: Continue to increase strength and stamina.    Felicia Woods Name 04/26/22 1056  05/04/22 1000 05/12/22 1625 05/18/22 1054 05/25/22 1505     Exercise Goal Re-Evaluation   Exercise Goals Review Increase Physical Activity;Increase Strength and Stamina;Understanding of Exercise Prescription Increase Physical Activity;Increase Strength and Stamina;Understanding of Exercise Prescription Increase Physical Activity;Increase Strength and Stamina;Understanding of Exercise Prescription Increase Physical Activity;Increase Strength and Stamina;Understanding of Exercise Prescription Increase Physical Activity;Increase Strength and Stamina;Understanding of Exercise Prescription   Comments Staff spoke with Felicia Woods to let her know she was cleared to come back to the program, per her Pulmonologist follow up appt. Patient plans to resume this week. Felicia Woods returned today.  She has been out since being hospitalized with another heart event. She has not been exercising since discharge as she has been working more. Felicia Woods returned to rehab after being hospitalized with another heart event. She has only been to  one session since returning. During that session she was able to walk 12 laps on the track and tolerated 3 lb hand weights for resistance training. We will continue to monitor her progress in the program. Felicia Woods is doing well in rehab.  She is walking and using her pedal machine and doing weights at home.  She is feeling like her stamina is getting better.  She knows that she feels better on days that she exercises versus when she does not come. Felicia Woods continues to do well in rehab. She is back up to walking 22 laps on the track! We hope to see that continue going up. She has been consistently at level 2 on the T4 Nustep and would benefit from going to level 3. She is limited by knee pain. She hits her THR most sessions. Her attendance is sporadic and expect more consistency. Will continue to monitor.   Expected Outcomes Short: Return to rehab and ease into it Long: Build up METS and overall strength Short; Return to regular rehab attendance Long; Return to routine exercise daily Short: Return to regular rehab attendance Long: Continue to increase strength and stamina. Short: Continue to exercise on his off days Long: Conitnue to improve stamina Short: Continue to increase laps on track, next time up to 25 Long: Continue to increase overall MET level            Discharge Exercise Prescription (Final Exercise Prescription Changes):  Exercise Prescription Changes - 05/25/22 1500       Response to Exercise   Blood Pressure (Admit) 122/70    Blood Pressure (Exercise) 128/64    Blood Pressure (Exit) 112/70    Heart Rate (Admit) 92 bpm    Heart Rate (Exercise) 134 bpm    Heart Rate (Exit) 92 bpm    Rating of Perceived Exertion (Exercise) 13    Symptoms none    Duration Continue with 30 min of aerobic exercise without signs/symptoms of physical distress.    Intensity THRR unchanged      Progression   Progression Continue to progress workloads to maintain intensity without signs/symptoms of  physical distress.    Average METs 1.8      Resistance Training   Training Prescription Yes    Weight 3 lb    Reps 10-15      Interval Training   Interval Training No      NuStep   Level 2    Minutes 15      REL-XR   Level 2    Minutes 15      Track   Laps 22    Minutes 15    METs 2.2  Oxygen   Maintain Oxygen Saturation 88% or higher             Nutrition:  Target Goals: Understanding of nutrition guidelines, daily intake of sodium <1558m, cholesterol <2081m calories 30% from fat and 7% or less from saturated fats, daily to have 5 or more servings of fruits and vegetables.  Education: All About Nutrition: -Group instruction provided by verbal, written material, interactive activities, discussions, models, and posters to present general guidelines for heart healthy nutrition including fat, fiber, MyPlate, the role of sodium in heart healthy nutrition, utilization of the nutrition label, and utilization of this knowledge for meal planning. Follow up email sent as well. Written material given at graduation. [facility]  Education need identified 02/08/22       Biometrics:  Pre Biometrics - 02/08/22 1627       Pre Biometrics   Height 5' 5.75" (1.67 m)    Weight 234 lb 1.6 oz (106.2 kg)    BMI (Calculated) 38.07    Single Leg Stand 6.6 seconds              Nutrition Therapy Plan and Nutrition Goals:  Nutrition Therapy & Goals - 02/08/22 1628       Intervention Plan   Intervention Prescribe, educate and counsel regarding individualized specific dietary modifications aiming towards targeted core components such as weight, hypertension, lipid management, diabetes, heart failure and other comorbidities.    Expected Outcomes Short Term Goal: Understand basic principles of dietary content, such as calories, fat, sodium, cholesterol and nutrients.;Short Term Goal: A plan has been developed  with personal nutrition goals set during dietitian appointment.;Long Term Goal: Adherence to prescribed nutrition plan.             Nutrition Assessments:  MEDIFICTS Score Key: ?70 Need to make dietary changes  40-70 Heart Healthy Diet ? 40 Therapeutic Level Cholesterol Diet  [facility]  Picture Your Plate Total Score on Admission 41      Picture Your Plate Scores: <4<66nhealthy dietary pattern with much room for improvement. 41-50 Dietary pattern unlikely to meet recommendations for good health and room for improvement. 51-60 More healthful dietary pattern, with some room for improvement.  >60 Healthy dietary pattern, although there may be some specific behaviors that could be improved.    Nutrition Goals Re-Evaluation:  Nutrition Goals Re-Evaluation     Row Name 05/05/22 0656 05/18/22 1109           Goals   Nutrition Goal Set up appt with dietitian Set up appt with dietitian      Comment Felicia Woods been out for over a month and has not had an appointment with Melissa yet. Has appointment on 11/6      Expected Outcome Set up appointment with dietitian Meet with dietitian               Nutrition Goals Discharge (Final Nutrition Goals Re-Evaluation):  Nutrition Goals Re-Evaluation - 05/18/22 1109       Goals   Nutrition Goal Set up appt with dietitian    Comment Has appointment on 11/6    Expected Outcome Meet with dietitian             Psychosocial: Target Goals: Acknowledge presence or absence of significant depression and/or stress, maximize coping skills, provide positive support system. Participant is able to verbalize types and ability to  use techniques and skills needed for reducing stress and depression.   Education: Stress, Anxiety, and Depression - Group verbal and visual presentation to define topics covered.  Reviews how body is impacted by stress, anxiety, and depression.   Also discusses healthy ways to reduce stress and to treat/manage anxiety and depression.  Written material given at graduation. Flowsheet Row Cardiac Rehab from 03/25/2022 in [facility] Cardiac and Pulmonary Rehab  Education need identified 02/08/22       Education: Sleep Hygiene -Provides group verbal and written instruction about how sleep can affect your health.  Define sleep hygiene, discuss sleep cycles and impact of sleep habits. Review good sleep hygiene tips.    Initial Review & Psychosocial Screening:  Initial Psych Review & Screening - 01/19/22 0908       Initial Review   Current issues with None Identified      Family Dynamics   Good Support System? Yes   husband and her children     Barriers   Psychosocial barriers to participate in program There are no identifiable barriers or psychosocial needs.;The patient should benefit from training in stress management and relaxation.      Screening Interventions   Interventions Encouraged to exercise;To provide support and resources with identified psychosocial needs;Provide feedback about the scores to participant    Expected Outcomes Short Term goal: Utilizing psychosocial counselor, staff and physician to assist with identification of specific Stressors or current issues interfering with healing process. Setting desired goal for each stressor or current issue identified.;Long Term Goal: Stressors or current issues are controlled or eliminated.;Short Term goal: Identification and review with participant of any Quality of Life or Depression concerns found by scoring the questionnaire.;Long Term goal: The participant improves quality of Life and PHQ9 Scores as seen by post scores and/or verbalization of changes             Quality of Life Scores:   Quality of Life - 02/08/22 1630       Quality of Life   Select Quality of Life      Quality of Life Scores   Health/Function Pre 23.93 %    Socioeconomic Pre 28.17 %     Psych/Spiritual Pre 30 %    Family Pre 30 %    GLOBAL Pre 27 %            Scores of 19 and below usually indicate a poorer quality of life in these areas.  A difference of  2-3 points is a clinically meaningful difference.  A difference of 2-3 points in the total score of the Quality of Life Index has been associated with significant improvement in overall quality of life, self-image, physical symptoms, and general health in studies assessing change in quality of life.  PHQ-9: Review Flowsheet  More data exists      04/20/2022 02/08/2022 12/03/2021 09/03/2021 03/12/2021  Depression screen PHQ 2/9  Decreased Interest 0 1 0 0 0  Down, Depressed, Hopeless 0 0 0 0 0  PHQ - 2 Score 0 1 0 0 0  Altered sleeping - 0 - - -  Tired, decreased energy - 0 - - -  Change in appetite - 0 - - -  Feeling bad or failure about yourself  - 0 - - -  Trouble concentrating - 0 - - -  Moving slowly or fidgety/restless - 0 - - -  Suicidal thoughts - 0 - - -  PHQ-9 Score - 1 - - -  Difficult doing  work/chores - Not difficult at all - - -   Interpretation of Total Score  Total Score Depression Severity:  1-4 = Minimal depression, 5-9 = Mild depression, 10-14 = Moderate depression, 15-19 = Moderately severe depression, 20-27 = Severe depression   Psychosocial Evaluation and Intervention:  Psychosocial Evaluation - 01/19/22 0912       Psychosocial Evaluation & Interventions   Comments Felicia Woods is again returning to [facility] after another stent. SHe has no barriers to attending the program. She now lives in a house and no longer in a second floor apartment. Her support team is her husband and her children. She is ready to return and continue to learn heart healthy living.    Expected Outcomes STG Felicia Woods will attend all scheduled sessions, she will progress with her exercise LTG Felicia Woods will be able to continue with ehr exercie progression and maintain control of her risk factors.    Continue Psychosocial Services  Follow  up required by staff             Psychosocial Re-Evaluation:  Psychosocial Re-Evaluation     [place] Name 05/04/22 1000 05/18/22 1056           Psychosocial Re-Evaluation   Current issues with Current Stress Concerns Current Stress Concerns      Comments Felicia Woods returned today after last heart event has had her out for over a month.  She tries not to let too much get to her, but they are short staffed at work.  This makes her feel over worked and unable to do much else. Felicia Woods is doing well in rehab.  She is off to a good restart.  Her health continues to be her biggest stressor.  She is learning to work with her work stress.  She is sleeping better than ever.  She enjoyes coming to class.      Expected Outcomes Short; Return to routine exercise for mental boost Long: continue to cope with work Short: Continue to exercise for mental health Long; continue to stay positive      Interventions Encouraged to attend Cardiac Rehabilitation for the exercise;Stress management education Encouraged to attend Cardiac Rehabilitation for the exercise;Stress management education      Continue Psychosocial Services  Follow up required by staff Follow up required by staff               Psychosocial Discharge (Final Psychosocial Re-Evaluation):  Psychosocial Re-Evaluation - 05/18/22 1056       Psychosocial Re-Evaluation   Current issues with Current Stress Concerns    Comments Felicia Woods is doing well in rehab.  She is off to a good restart.  Her health continues to be her biggest stressor.  She is learning to work with her work stress.  She is sleeping better than ever.  She enjoyes coming to class.    Expected Outcomes Short: Continue to exercise for mental health Long; continue to stay positive    Interventions Encouraged to attend Cardiac Rehabilitation for the exercise;Stress management education    Continue Psychosocial Services  Follow up required by staff             Vocational  Rehabilitation: Provide vocational rehab assistance to qualifying candidates.   Vocational Rehab Evaluation & Intervention:  Vocational Rehab - 01/19/22 0912       Initial Vocational Rehab Evaluation & Intervention   Assessment shows need for Vocational Rehabilitation No      Vocational Rehab Re-Evaulation   Comments no request fro VR  Education: Education Goals: Education classes will be provided on a variety of topics geared toward better understanding of heart health and risk factor modification. Participant will state understanding/return demonstration of topics presented as noted by education test scores.  Learning Barriers/Preferences:   General Cardiac Education Topics:  AED/CPR: - Group verbal and written instruction with the use of models to demonstrate the basic use of the AED with the basic ABC's of resuscitation.   Anatomy and Cardiac Procedures: - Group verbal and visual presentation and models provide information about basic cardiac anatomy and function. Reviews the testing methods done to diagnose heart disease and the outcomes of the test results. Describes the treatment choices: Medical Management, Angioplasty, or Coronary Bypass Surgery for treating various heart conditions including Myocardial Infarction, Angina, Valve Disease, and Cardiac Arrhythmias.  Written material given at graduation. Flowsheet Row Cardiac Rehab from 03/25/2022 in [facility] Cardiac and Pulmonary Rehab  Education need identified 02/08/22  Date 03/25/22  Educator sb  Instruction Review Code 1- Verbalizes Understanding       Medication Safety: - Group verbal and visual instruction to review commonly prescribed medications for heart and lung disease. Reviews the medication, class of the drug, and side effects. Includes the steps to properly store meds and maintain the prescription regimen.  Written material given at graduation.   Intimacy: - Group verbal instruction through  game format to discuss how heart and lung disease can affect sexual intimacy. Written material given at graduation..   Know Your Numbers and Heart Failure: - Group verbal and visual instruction to discuss disease risk factors for cardiac and pulmonary disease and treatment options.  Reviews associated critical values for Overweight/Obesity, Hypertension, Cholesterol, and Diabetes.  Discusses basics of heart failure: signs/symptoms and treatments.  Introduces Heart Failure Zone chart for action plan for heart failure.  Written material given at graduation.   Infection Prevention: - Provides verbal and written material to individual with discussion of infection control including proper hand washing and proper equipment cleaning during exercise session. Flowsheet Row Cardiac Rehab from 03/25/2022 in [facility] Cardiac and Pulmonary Rehab  Date 02/08/22  Educator [facility]  Instruction Review Code 1- Verbalizes Understanding       Falls Prevention: - Provides verbal and written material to individual with discussion of falls prevention and safety. Flowsheet Row Cardiac Rehab from 03/25/2022 in [facility] Cardiac and Pulmonary Rehab  Date 02/08/22  Educator [facility]  Instruction Review Code 1- Verbalizes Understanding       Other: -Provides group and verbal instruction on various topics (see comments)   Knowledge Questionnaire Score:  Knowledge Questionnaire Score - 02/08/22 1631       Knowledge Questionnaire Score   Pre Score 19/26             Core Components/Risk Factors/Patient Goals at Admission:  Personal Goals and Risk Factors at Admission - 02/08/22 1628       Core Components/Risk Factors/Patient Goals on Admission    Weight Management Yes    Intervention Weight Management: Develop a combined nutrition and exercise program designed to reach desired caloric intake, while maintaining appropriate intake of nutrient and fiber, sodium and fats, and appropriate energy expenditure required for the  weight goal.;Weight Management: Provide education and appropriate resources to help participant work on and attain dietary goals.;Weight Management/Obesity: Establish reasonable short term and long term weight goals.;Obesity: Provide education and appropriate resources to help participant work on and attain dietary goals.    Admit Weight 234 lb 1.6 oz (106.2 kg)  Goal Weight: Short Term 228 lb (103.4 kg)    Goal Weight: Long Term 200 lb (90.7 kg)    Expected Outcomes Short Term: Continue to assess and modify interventions until short term weight is achieved;Long Term: Adherence to nutrition and physical activity/exercise program aimed toward attainment of established weight goal;Weight Loss: Understanding of general recommendations for a balanced deficit meal plan, which promotes 1-2 lb weight loss per week and includes a negative energy balance of [phone] kcal/d;Understanding recommendations for meals to include 15-35% energy as protein, 25-35% energy from fat, 35-60% energy from carbohydrates, less than 236m of dietary cholesterol, 20-35 gm of total fiber daily;Understanding of distribution of calorie intake throughout the day with the consumption of 4-5 meals/snacks    Diabetes Yes    Intervention Provide education about signs/symptoms and action to take for hypo/hyperglycemia.;Provide education about proper nutrition, including hydration, and aerobic/resistive exercise prescription along with prescribed medications to achieve blood glucose in normal ranges: Fasting glucose 65-99 mg/dL    Expected Outcomes Short Term: Participant verbalizes understanding of the signs/symptoms and immediate care of hyper/hypoglycemia, proper foot care and importance of medication, aerobic/resistive exercise and nutrition plan for blood glucose control.;Long Term: Attainment of HbA1C < 7%.    Hypertension Yes    Intervention Provide education on lifestyle modifcations including regular physical activity/exercise,  weight management, moderate sodium restriction and increased consumption of fresh fruit, vegetables, and low fat dairy, alcohol moderation, and smoking cessation.;Monitor prescription use compliance.    Expected Outcomes Short Term: Continued assessment and intervention until BP is < 140/960mHG in hypertensive participants. < 130/8036mG in hypertensive participants with diabetes, heart failure or chronic kidney disease.;Long Term: Maintenance of blood pressure at goal levels.    Lipids Yes    Intervention Provide education and support for participant on nutrition & aerobic/resistive exercise along with prescribed medications to achieve LDL <28m22mDL >40mg21m Expected Outcomes Short Term: Participant states understanding of desired cholesterol values and is compliant with medications prescribed. Participant is following exercise prescription and nutrition guidelines.;Long Term: Cholesterol controlled with medications as prescribed, with individualized exercise RX and with personalized nutrition plan. Value goals: LDL < 28mg,67m > 40 mg.             Education:Diabetes - Individual verbal and written instruction to review signs/symptoms of diabetes, desired ranges of glucose level fasting, after meals and with exercise. Acknowledge that pre and post exercise glucose checks will be done for 3 sessions at entry of program. [facility]  Education need identified 02/08/22       Core Components/Risk Factors/Patient Goals Review:   Goals and Risk Factor Review     Row Name 05/04/22 1000 05/18/22 1109           Core Components/Risk Factors/Patient Goals Review   Personal Goals Review Weight Management/Obesity;Hypertension;Diabetes Weight Management/Obesity;Hypertension;Diabetes      Review Felicia Woods returned today after being out for over a month with a another heart event.  Her weight is up some since she started, but she has  not been exercising.  Her pressures have been good and sugars are stable.  She hopes that getting back to exercise again will help with weight loss Felicia Woods to work on weight loss.  She has been holding steady still, but not gaining.  She contineus to have better control over her sugars. Her pressures are doing well.  She feels good with her meds.  Expected Outcomes Short; Reutrn to steady attendance in rehab Long: Contineu to monitor risk factors Short: Contineu to work on weight loss Long: Conitnue to monitor sugars               Core Components/Risk Factors/Patient Goals at Discharge (Final Review):   Goals and Risk Factor Review - 05/18/22 1109       Core Components/Risk Factors/Patient Goals Review   Personal Goals Review Weight Management/Obesity;Hypertension;Diabetes    Review Felicia Woods continues to work on weight loss.  She has been holding steady still, but not gaining.  She contineus to have better control over her sugars. Her pressures are doing well.  She feels good with her meds.    Expected Outcomes Short: Contineu to work on weight loss Long: Conitnue to monitor sugars             ITP Comments:  ITP Comments     Row Name 01/19/22 0915 02/08/22 1611 02/10/22 1006 02/18/22 1001 03/10/22 1415   ITP Comments Virtual orientation call completed today. shehas an appointment on Date: 01/26/2022  for EP eval and gym Orientation.  Documentation of diagnosis can be found in [facility] Date: 12/24/2021 . Completed 6MWT and gym orientation. Initial ITP created and sent for review to Dr. Emily Filbert, Medical Director. 30 Day review completed. Medical Director ITP review done, changes made as directed, and signed approval by Medical Director.   NEW First full day of exercise!  Patient was oriented to gym and equipment including functions, settings, policies, and procedures.  Patient's individual exercise prescription and treatment plan were reviewed.  All starting workloads were  established based on the results of the 6 minute walk test done at initial orientation visit.  The plan for exercise progression was also introduced and progression will be customized based on patient's performance and goals. 30 Day review completed. Medical Director ITP review done, changes made as directed, and signed approval by Medical Director.   NEW    Row Name 03/11/22 1035 04/06/22 1050 04/07/22 0809 04/15/22 1513 04/26/22 1055   ITP Comments Felicia Woods is using nitro sl at least 2 x a day for chest pain with exertion. Advised her to notify MD of this. Also gave her another med name  to review with her physician for angina control. Patient called to let us know she is getting admitted to the hospital for cardiac issues. Will place patient on medical hold from rehab and will need clearance for her to return. 30 Day review completed. Medical Director ITP review done, changes made as directed, and signed approval by Medical Director.   Out for medical reason Felicia Woods called to let us know that she does not have a follow up appt until 11/22 from her recent hospitalization for MI.  She would like to finish the program. Staff spoke with Felicia Woods to let her know she was cleared to come back to the program, per her Pulmonologist follow up appt. Patient plans to resume this week.    [place] Name 05/05/22 1039 06/02/22 1103         ITP Comments 30 Day review completed. Medical Director ITP review done, changes made as directed, and signed approval by Medical Director. 30 Day review completed. Medical Director ITP review done, changes made as directed, and signed approval by Medical Director.               Comments:

## 2022-06-03 ENCOUNTER — Ambulatory Visit: Payer: Medicare Other | Admitting: Obstetrics

## 2022-06-03 ENCOUNTER — Encounter: Payer: Medicare (Managed Care) | Admitting: Physical Therapy

## 2022-06-08 ENCOUNTER — Encounter: Payer: Medicare Other | Attending: Cardiology | Admitting: *Deleted

## 2022-06-08 DIAGNOSIS — Z48812 Encounter for surgical aftercare following surgery on the circulatory system: Secondary | ICD-10-CM | POA: Insufficient documentation

## 2022-06-08 DIAGNOSIS — Z955 Presence of coronary angioplasty implant and graft: Secondary | ICD-10-CM | POA: Diagnosis present

## 2022-06-08 DIAGNOSIS — I252 Old myocardial infarction: Secondary | ICD-10-CM | POA: Insufficient documentation

## 2022-06-08 NOTE — Progress Notes (Signed)
Daily Session Note  Patient Details  Name: Felicia Woods MRN: [id number] Date of Birth: 03/07/1956 Referring Provider:   Flowsheet Row Cardiac Rehab from 02/08/2022 in [facility] Cardiac and Pulmonary Rehab  Referring Provider Corky Sox       Encounter Date: 06/08/2022  Check In:  Session Check In - 06/08/22 1003       Check-In   Supervising physician immediately available to respond to emergencies See telemetry face sheet for immediately available ER MD    Location [facility]-Cardiac & Pulmonary Rehab    Staff Present Heath Lark, RN, BSN, CCRP;Jessica Sterling, MA, RCEP, CCRP, Bertram Gala, MS, ACSM CEP, Exercise Physiologist    Virtual Visit No    Medication changes reported     No    Fall or balance concerns reported    No    Warm-up and Cool-down Performed on first and last piece of equipment    Resistance Training Performed Yes    VAD Patient? No    PAD/SET Patient? No      Pain Assessment   Currently in Pain? No/denies                Social History   Tobacco Use  Smoking Status Never  Smokeless Tobacco Never    Goals Met:  Independence with exercise equipment Exercise tolerated well No report of concerns or symptoms today  Goals Unmet:  Not Applicable  Comments: Pt able to follow exercise prescription today without complaint.  Will continue to monitor for progression. Ketina has another cardiac stent procedure scheduled for 12/14.   Dr. Emily Filbert is Medical Director for [facility].  Dr. Ottie Glazier is Medical Director for [facility] Pulmonary Rehabilitation.

## 2022-06-10 ENCOUNTER — Encounter: Payer: Medicare Other | Admitting: *Deleted

## 2022-06-10 DIAGNOSIS — Z955 Presence of coronary angioplasty implant and graft: Secondary | ICD-10-CM

## 2022-06-10 DIAGNOSIS — I2089 Other forms of angina pectoris: Secondary | ICD-10-CM

## 2022-06-10 NOTE — Progress Notes (Signed)
Daily Session Note  Patient Details  Name: Felicia Woods MRN: [id number] Date of Birth: August 17, 1955 Referring Provider:   Flowsheet Row Cardiac Rehab from 02/08/2022 in [facility] Cardiac and Pulmonary Rehab  Referring Provider Corky Sox       Encounter Date: 06/10/2022  Check In:  Session Check In - 06/10/22 0928       Check-In   Supervising physician immediately available to respond to emergencies See telemetry face sheet for immediately available ER MD    Location [facility]-Cardiac & Pulmonary Rehab    Staff Present Heath Lark, RN, BSN, CCRP;Noreen Mackintosh [place], MA, RCEP, CCRP, Bertram Gala, MS, ACSM CEP, Exercise Physiologist;Noah Tickle, BS, Exercise Physiologist    Virtual Visit No    Medication changes reported     No    Fall or balance concerns reported    No    Warm-up and Cool-down Performed on first and last piece of equipment    Resistance Training Performed Yes    VAD Patient? No    PAD/SET Patient? No      Pain Assessment   Currently in Pain? No/denies                Social History   Tobacco Use  Smoking Status Never  Smokeless Tobacco Never    Goals Met:  Independence with exercise equipment Exercise tolerated well No report of concerns or symptoms today Strength training completed today  Goals Unmet:  Not Applicable  Comments: Pt able to follow exercise prescription today without complaint.  Will continue to monitor for progression.    Dr. Emily Filbert is Medical Director for [facility].  Dr. Ottie Glazier is Medical Director for [facility] Pulmonary Rehabilitation.

## 2022-06-15 ENCOUNTER — Encounter: Payer: Medicare Other | Admitting: *Deleted

## 2022-06-15 DIAGNOSIS — Z955 Presence of coronary angioplasty implant and graft: Secondary | ICD-10-CM

## 2022-06-15 NOTE — Progress Notes (Signed)
Daily Session Note  Patient Details  Name: Rolonda Pontarelli MRN: [id number] Date of Birth: 19-Dec-1955 Referring Provider:   Flowsheet Row Cardiac Rehab from 02/08/2022 in [facility] Cardiac and Pulmonary Rehab  Referring Provider Corky Sox       Encounter Date: 06/15/2022  Check In:  Session Check In - 06/15/22 0956       Check-In   Supervising physician immediately available to respond to emergencies See telemetry face sheet for immediately available ER MD    Location [facility]-Cardiac & Pulmonary Rehab    Staff Present Renita Papa, RN BSN;Jessica Luan Pulling, MA, RCEP, CCRP, Bertram Gala, MS, ACSM CEP, Exercise Physiologist    Virtual Visit No    Medication changes reported     No    Fall or balance concerns reported    No    Warm-up and Cool-down Performed on first and last piece of equipment    Resistance Training Performed Yes    VAD Patient? No    PAD/SET Patient? No      Pain Assessment   Currently in Pain? No/denies                Social History   Tobacco Use  Smoking Status Never  Smokeless Tobacco Never    Goals Met:  Independence with exercise equipment Exercise tolerated well No report of concerns or symptoms today Strength training completed today  Goals Unmet:  Not Applicable  Comments: Pt able to follow exercise prescription today without complaint.  Will continue to monitor for progression.    Dr. Emily Filbert is Medical Director for [facility].  Dr. Ottie Glazier is Medical Director for [facility] Pulmonary Rehabilitation.

## 2022-06-15 NOTE — Progress Notes (Signed)
Completed initial nutrition consultation.   

## 2022-06-17 ENCOUNTER — Encounter
Admission: RE | Disposition: A | Discharge status: Home / Self Care | Payer: Self-pay | Source: Ambulatory Visit | Attending: Internal Medicine

## 2022-06-17 ENCOUNTER — Ambulatory Visit
Admission: RE | Admit: 2022-06-17 | Discharge: 2022-06-17 | Disposition: A | Discharge status: Home / Self Care | Payer: Medicare Other | Source: Ambulatory Visit | Attending: Internal Medicine | Admitting: Internal Medicine

## 2022-06-17 ENCOUNTER — Encounter: Payer: Self-pay | Admitting: Internal Medicine

## 2022-06-17 ENCOUNTER — Other Ambulatory Visit: Payer: Self-pay

## 2022-06-17 DIAGNOSIS — I2582 Chronic total occlusion of coronary artery: Secondary | ICD-10-CM | POA: Insufficient documentation

## 2022-06-17 DIAGNOSIS — I25700 Atherosclerosis of coronary artery bypass graft(s), unspecified, with unstable angina pectoris: Secondary | ICD-10-CM | POA: Insufficient documentation

## 2022-06-17 DIAGNOSIS — I2089 Other forms of angina pectoris: Secondary | ICD-10-CM

## 2022-06-17 HISTORY — PX: LEFT HEART CATH AND CORONARY ANGIOGRAPHY: CATH118249

## 2022-06-17 SURGERY — LEFT HEART CATH AND CORONARY ANGIOGRAPHY
Anesthesia: Moderate Sedation | Laterality: Left

## 2022-06-17 MED ORDER — IOHEXOL 300 MG/ML  SOLN
INTRAMUSCULAR | Status: DC | PRN
  Administered 2022-06-17: 130 mL

## 2022-06-17 MED ORDER — SODIUM CHLORIDE 0.9 % WEIGHT BASED INFUSION: 1.0000 mL/kg/h | INTRAVENOUS | Status: DC

## 2022-06-17 MED ORDER — FENTANYL CITRATE (PF) 100 MCG/2ML IJ SOLN
INTRAMUSCULAR | Status: AC
  Filled 2022-06-17: qty 2

## 2022-06-17 MED ORDER — LIDOCAINE HCL (PF) 1 % IJ SOLN
INTRAMUSCULAR | Status: DC | PRN
  Administered 2022-06-17: 20 mL

## 2022-06-17 MED ORDER — CLONIDINE HCL 0.1 MG PO TABS
0.1000 mg | ORAL_TABLET | Freq: Once | ORAL | Status: AC
  Administered 2022-06-17: 0.1 mg via ORAL

## 2022-06-17 MED ORDER — SODIUM CHLORIDE 0.9 % IV SOLN: 250.0000 mL | INTRAVENOUS | Status: DC | PRN

## 2022-06-17 MED ORDER — ONDANSETRON HCL 4 MG/2ML IJ SOLN: 4.0000 mg | Freq: Four times a day (QID) | INTRAMUSCULAR | Status: DC | PRN

## 2022-06-17 MED ORDER — SODIUM CHLORIDE 0.9% FLUSH: 3.0000 mL | INTRAVENOUS | Status: DC | PRN

## 2022-06-17 MED ORDER — LIDOCAINE HCL 1 % IJ SOLN
INTRAMUSCULAR | Status: AC
  Filled 2022-06-17: qty 20

## 2022-06-17 MED ORDER — SODIUM CHLORIDE 0.9 % WEIGHT BASED INFUSION: 3.0000 mL/kg/h | INTRAVENOUS | Status: AC

## 2022-06-17 MED ORDER — SODIUM CHLORIDE 0.9% FLUSH: 3.0000 mL | Freq: Two times a day (BID) | INTRAVENOUS | Status: DC

## 2022-06-17 MED ORDER — HEPARIN (PORCINE) IN NACL 1000-0.9 UT/500ML-% IV SOLN
INTRAVENOUS | Status: AC
  Filled 2022-06-17: qty 1000

## 2022-06-17 MED ORDER — SODIUM CHLORIDE 0.9 % IV SOLN
INTRAVENOUS | Status: AC | PRN
  Administered 2022-06-17: 3 mL/kg/h via INTRAVENOUS

## 2022-06-17 MED ORDER — LABETALOL HCL 5 MG/ML IV SOLN: 10.0000 mg | INTRAVENOUS | Status: DC | PRN

## 2022-06-17 MED ORDER — MIDAZOLAM HCL 2 MG/2ML IJ SOLN
INTRAMUSCULAR | Status: AC
  Filled 2022-06-17: qty 2

## 2022-06-17 MED ORDER — MIDAZOLAM HCL 2 MG/2ML IJ SOLN
INTRAMUSCULAR | Status: DC | PRN
  Administered 2022-06-17: 2 mg via INTRAVENOUS

## 2022-06-17 MED ORDER — ACETAMINOPHEN 325 MG PO TABS: 650.0000 mg | ORAL_TABLET | ORAL | Status: DC | PRN

## 2022-06-17 MED ORDER — ASPIRIN 81 MG PO CHEW
81.0000 mg | CHEWABLE_TABLET | ORAL | Status: AC
  Administered 2022-06-17: 81 mg via ORAL

## 2022-06-17 MED ORDER — FENTANYL CITRATE (PF) 100 MCG/2ML IJ SOLN
INTRAMUSCULAR | Status: DC | PRN
  Administered 2022-06-17: 25 ug via INTRAVENOUS
  Administered 2022-06-17: 50 ug via INTRAVENOUS

## 2022-06-17 MED ORDER — ASPIRIN 81 MG PO CHEW
CHEWABLE_TABLET | ORAL | Status: AC
  Filled 2022-06-17: qty 1

## 2022-06-17 MED ORDER — CLONIDINE HCL 0.1 MG PO TABS
ORAL_TABLET | ORAL | Status: AC
  Filled 2022-06-17: qty 1

## 2022-06-17 MED ORDER — ASPIRIN 81 MG PO CHEW: 81.0000 mg | CHEWABLE_TABLET | Freq: Every day | ORAL | Status: DC

## 2022-06-17 MED ORDER — HYDRALAZINE HCL 20 MG/ML IJ SOLN: 10.0000 mg | INTRAMUSCULAR | Status: DC | PRN

## 2022-06-17 MED ORDER — HEPARIN (PORCINE) IN NACL 1000-0.9 UT/500ML-% IV SOLN
INTRAVENOUS | Status: DC | PRN
  Administered 2022-06-17 (×2): 500 mL

## 2022-06-17 SURGICAL SUPPLY — 11 items
CATH INFINITI 5FR MULTPACK ANG (CATHETERS) IMPLANT
DEVICE CLOSURE MYNXGRIP 5F (Vascular Products) IMPLANT
NDL PERC 18GX7CM (NEEDLE) IMPLANT
NEEDLE PERC 18GX7CM (NEEDLE) ×1 IMPLANT
PACK CARDIAC CATH (CUSTOM PROCEDURE TRAY) ×1 IMPLANT
PANNUS RETENTION SYSTEM 2 PAD (MISCELLANEOUS) IMPLANT
PROTECTION STATION PRESSURIZED (MISCELLANEOUS) ×1
SET ATX SIMPLICITY (MISCELLANEOUS) IMPLANT
SHEATH AVANTI 5FR X 11CM (SHEATH) IMPLANT
STATION PROTECTION PRESSURIZED (MISCELLANEOUS) IMPLANT
WIRE GUIDERIGHT .035X150 (WIRE) IMPLANT

## 2022-06-17 NOTE — Progress Notes (Signed)
Right femoral venous shealth in place for procedure.  Venous sheath pulled at 1430, pressure held for 61mn until 1445 right femoral venous site soft cdi no bleeding or hematoma noted. Cath site dressing removed and replaced with new sterile guaze dressing covered with tegaderm

## 2022-06-21 ENCOUNTER — Telehealth: Payer: Self-pay

## 2022-06-21 DIAGNOSIS — Z955 Presence of coronary angioplasty implant and graft: Secondary | ICD-10-CM

## 2022-06-21 NOTE — Telephone Encounter (Signed)
Called patient, she had a cath completed 12/14, but states no PCI. She states she is supposed to see her cardiologist on Wed, 12/20 to discuss next steps. Patient to call us after she sees doctor on when she is cleared to return.

## 2022-06-23 ENCOUNTER — Encounter: Payer: Self-pay | Admitting: Internal Medicine

## 2022-06-24 ENCOUNTER — Ambulatory Visit: Payer: Medicare (Managed Care) | Admitting: Gastroenterology

## 2022-06-30 ENCOUNTER — Encounter: Payer: Self-pay | Admitting: *Deleted

## 2022-06-30 DIAGNOSIS — Z955 Presence of coronary angioplasty implant and graft: Secondary | ICD-10-CM

## 2022-06-30 NOTE — Progress Notes (Signed)
Cardiac Individual Treatment Plan  Patient Details  Name: Felicia Woods MRN: [id number] Date of Birth: July 08, 1955 Referring Provider:   Flowsheet Row Cardiac Rehab from 02/08/2022 [facility]  Referring Provider Corky Sox       Initial Encounter Date:  Flowsheet Row Cardiac Rehab from 02/08/2022 in [facility] Cardiac and Pulmonary Rehab  Date 02/08/22       Visit Diagnosis: S/P coronary artery stent placement  Patient's Home Medications on Admission:  Current Outpatient Medications:    Accu-Chek Softclix Lancets lancets, Use as instructed twice a day to check blood sugars  DIAG -E11.59, Disp: 100 each, Rfl: 3   albuterol (VENTOLIN HFA) 108 (90 Base) MCG/ACT inhaler, Inhale 2 puffs into the lungs every 6 (six) hours as needed for wheezing or shortness of breath., Disp: 8 g, Rfl: 6   amLODipine (NORVASC) 10 MG tablet, Take 1 tablet by mouth daily., Disp: , Rfl:    aspirin EC 81 MG tablet, Take 81 mg by mouth daily., Disp: , Rfl:    atorvastatin (LIPITOR) 40 MG tablet, Take 1 tablet (40 mg total) by mouth daily., Disp: 90 tablet, Rfl: 3   azithromycin (ZITHROMAX) 250 MG tablet, Take one tab a day for 10 days for uri (Patient not taking: Reported on 06/17/2022), Disp: 10 tablet, Rfl: 0   chlorpheniramine-HYDROcodone (TUSSIONEX) 10-8 MG/5ML, Take 5 mLs by mouth every 12 (twelve) hours as needed for cough. (Patient not taking: Reported on 06/17/2022), Disp: 70 mL, Rfl: 0   clopidogrel (PLAVIX) 75 MG tablet, Take 1 tablet by mouth daily., Disp: , Rfl:    cyclobenzaprine (FLEXERIL) 10 MG tablet, Take 1 tablet by mouth at bedtime., Disp: , Rfl:    Dulaglutide (TRULICITY) 1.5 TZ/0.0FV SOPN, Inject 1.5 mg into the skin once a week., Disp: 6 mL, Rfl: 1   empagliflozin (JARDIANCE) 25 MG TABS tablet, Take one tab a day for diabetes, Disp: 90 tablet, Rfl: 3   famotidine (PEPCID) 20 MG tablet, Take 1 tablet by mouth 2 (two) times daily., Disp: , Rfl:    Fluticasone-Umeclidin-Vilant  (TRELEGY ELLIPTA) 100-62.5-25 MCG/ACT AEPB, INHALE 1 PUFF INTO THE LUNGS ONCE DAILY, Disp: , Rfl:    furosemide (LASIX) 40 MG tablet, Take 1 tablet (40 mg total) by mouth 2 (two) times daily., Disp: 180 tablet, Rfl: 1   glucose blood (ACCU-CHEK GUIDE) test strip, Use as instructed to check blood sugars twice a day  E11.59, Disp: 100 each, Rfl: 3   isosorbide mononitrate (IMDUR) 60 MG 24 hr tablet, Take one tab po qhs for heart, Disp: 90 tablet, Rfl: 1   losartan (COZAAR) 100 MG tablet, Take 1 tablet by mouth daily., Disp: , Rfl:    metoprolol succinate (TOPROL-XL) 25 MG 24 hr tablet, Take 1 tablet (25 mg total) by mouth daily., Disp: 90 tablet, Rfl: 1   Misc. Devices (ADJUST BATH/SHOWER SEAT) MISC, 1 shower seat for use at home to reduce risk of falling., Disp: 1 each, Rfl: 0   montelukast (SINGULAIR) 10 MG tablet, Take 1 tablet by mouth at bedtime., Disp: , Rfl:    nitroGLYCERIN (NITROSTAT) 0.4 MG SL tablet, DISSOLVE ONE TABLET UNDER THE TONGUE EVERY 5 MINUTES AS NEEDED FOR CHEST PAIN.  DO NOT EXCEED A TOTAL OF 3 DOSES IN 15 MINUTES, Disp: 30 tablet, Rfl: 3   oxybutynin (DITROPAN) 5 MG tablet, Take 1 tablet by mouth daily., Disp: , Rfl:    ranolazine (RANEXA) 1000 MG SR tablet, Take 1 tablet (1,000 mg total) by mouth  2 (two) times daily., Disp: 60 tablet, Rfl: 1   zolpidem (AMBIEN) 5 MG tablet, Take 1 tablet (5 mg total) by mouth at bedtime as needed. for sleep, Disp: 30 tablet, Rfl: 0  Past Medical History: Past Medical History:  Diagnosis Date   Asthma    Coronary artery disease    Diabetes mellitus without complication ([place])    Heart attack ([place])    Hyperlipidemia    Hypertension    MI (myocardial infarction) ([place])    Migraine headache with aura    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    Tobacco Use: Social History   Tobacco Use  Smoking Status Never  Smokeless Tobacco Never    Labs: Review Flowsheet  More data exists      Latest Ref Rng & Units 06/10/2021 09/03/2021 09/11/2021  12/03/2021 04/20/2022  Labs for ITP Cardiac and Pulmonary Rehab  Cholestrol 0 - 200 mg/dL 181  - 243  - -  LDL (calc) 0 - 99 mg/dL 125  - 181  - -  HDL-C >40 mg/dL 32  - 50  - -  Trlycerides <150 mg/dL 121  - 62  - -  Hemoglobin A1c 4.0 - 5.6 % 8.2  7.1  - 7.2  8.3      Exercise Target Goals: Exercise Program Goal: Individual exercise prescription set using results from initial 6 min walk test and THRR while considering  patient's activity barriers and safety.   Exercise Prescription Goal: Initial exercise prescription builds to 30-45 minutes a day of aerobic activity, 2-3 days per week.  Home exercise guidelines will be given to patient during program as part of exercise prescription that the participant will acknowledge.   Education: Aerobic Exercise: - Group verbal and visual presentation on the components of exercise prescription. Introduces F.I.T.T principle from ACSM for exercise prescriptions.  Reviews F.I.T.T. principles of aerobic exercise including progression. Written material given at graduation. Flowsheet Row Cardiac Rehab from 06/10/2022 in [facility] Cardiac and Pulmonary Rehab  Education need identified 02/08/22       Education: Resistance Exercise: - Group verbal and visual presentation on the components of exercise prescription. Introduces F.I.T.T principle from ACSM for exercise prescriptions  Reviews F.I.T.T. principles of resistance exercise including progression. Written material given at graduation.    Education: Exercise & Equipment Safety: - Individual verbal instruction and demonstration of equipment use and safety with use of the equipment. Flowsheet Row Cardiac Rehab from 06/10/2022 in [facility] Cardiac and Pulmonary Rehab  Date 02/08/22  Educator [facility]  Instruction Review Code 1- Verbalizes Understanding       Education: Exercise Physiology & General Exercise Guidelines: - Group verbal and written instruction with models to review the exercise physiology of the  cardiovascular system and associated critical values. Provides general exercise guidelines with specific guidelines to those with heart or lung disease.    Education: Flexibility, Balance, Mind/Body Relaxation: - Group verbal and visual presentation with interactive activity on the components of exercise prescription. Introduces F.I.T.T principle from ACSM for exercise prescriptions. Reviews F.I.T.T. principles of flexibility and balance exercise training including progression. Also discusses the mind body connection.  Reviews various relaxation techniques to help reduce and manage stress (i.e. Deep breathing, progressive muscle relaxation, and visualization). Balance handout provided to take home. Written material given at graduation.   Activity Barriers & Risk Stratification:  Activity Barriers & Cardiac Risk Stratification - 01/19/22 0907       Activity Barriers & Cardiac Risk Stratification   Activity Barriers  Arthritis;Joint Problems;Shortness of Breath    Cardiac Risk Stratification High             6 Minute Walk:  6 Minute Walk     Row Name 02/08/22 1612         6 Minute Walk   Phase Initial     Distance 665 feet     Walk Time 5.5 minutes     # of Rest Breaks 1     MPH 1.38     METS 1.65     RPE 12     Perceived Dyspnea  1     VO2 Peak 5.77     Symptoms Yes (comment)     Comments Chest pain 7/10 during walk. Relieved with rest and then resumed walk with no chest pain.     Resting HR 84 bpm     Resting BP 110/60     Resting Oxygen Saturation  97 %     Exercise Oxygen Saturation  during 6 min walk 97 %     Max Ex. HR 108 bpm     Max Ex. BP 138/60     2 Minute Post BP 118/78              Oxygen Initial Assessment:   Oxygen Re-Evaluation:   Oxygen Discharge (Final Oxygen Re-Evaluation):   Initial Exercise Prescription:  Initial Exercise Prescription - 02/08/22 1600       Date of Initial Exercise RX and Referring Provider   Date 02/08/22     Referring Provider Corky Sox      Oxygen   Maintain Oxygen Saturation 88% or higher      NuStep   Level 1    SPM 80    Minutes 15    METs 1.65      Arm Ergometer   Level 1    RPM 30    Minutes 15    METs 1.65      REL-XR   Level 1    Speed 50    Minutes 15    METs 1.65      Biostep-RELP   Level 1    SPM 50    Minutes 15    METs 1.65      Track   Laps 7    Minutes 15    METs 1.38      Intensity   THRR 40-80% of Max Heartrate 112-141    Ratings of Perceived Exertion 11-13    Perceived Dyspnea 0-4      Progression   Progression Continue to progress workloads to maintain intensity without signs/symptoms of physical distress.      Resistance Training   Training Prescription Yes    Weight 3    Reps 10-15             Perform Capillary Blood Glucose checks as needed.  Exercise Prescription Changes:   Exercise Prescription Changes     Row Name 02/08/22 1600 03/02/22 1500 03/18/22 1700 03/29/22 1400 05/12/22 1600     Response to Exercise   Blood Pressure (Admit) 110/60 124/82 122/56 128/72 122/62   Blood Pressure (Exercise) 138/60 134/70 -- -- 116/68   Blood Pressure (Exit) 118/78 120/80 122/64 104/72 106/68   Heart Rate (Admit) 84 bpm 83 bpm 98 bpm 99 bpm 90 bpm   Heart Rate (Exercise) 108 bpm 131 bpm 132 bpm 127 bpm 120 bpm   Heart Rate (Exit) 85 bpm 86 bpm 95 bpm 97 bpm 86 bpm  Oxygen Saturation (Admit) 97 % -- -- -- --   Oxygen Saturation (Exercise) 97 % -- -- -- --   Oxygen Saturation (Exit) 97 % -- -- -- --   Rating of Perceived Exertion (Exercise) _0 Perceived Dyspnea (Exercise) 1 -- -- -- --   Symptoms Chest pain 7/10 relieved with rest. Resumed walk test and finished with no more pain. knee pain, fatigue none none none   Comments 6MWT results 2nd full day of exercise -- -- --   Duration -- Progress to 30 minutes of  aerobic without signs/symptoms of physical distress Continue with 30 min of aerobic exercise without signs/symptoms of  physical distress. Continue with 30 min of aerobic exercise without signs/symptoms of physical distress. Continue with 30 min of aerobic exercise without signs/symptoms of physical distress.   Intensity -- THRR unchanged THRR unchanged THRR unchanged THRR unchanged     Progression   Progression -- Continue to progress workloads to maintain intensity without signs/symptoms of physical distress. Continue to progress workloads to maintain intensity without signs/symptoms of physical distress. Continue to progress workloads to maintain intensity without signs/symptoms of physical distress. Continue to progress workloads to maintain intensity without signs/symptoms of physical distress.   Average METs -- 2.19 2.81 2.71 1.73     Resistance Training   Training Prescription -- Yes Yes Yes Yes   Weight -- 3 lb 3 lb 3 lb 3 lb   Reps -- 10-15 10-15 10-15 10-15     Interval Training   Interval Training -- No No No No     Arm Ergometer   Level -- _1 Minutes -- _2 METs -- 2.4 2.3 2.5 1.8     REL-XR   Level -- -- 3 2 --   Minutes -- -- 15 15 --   METs -- -- -- 4 --     Track   Laps -- _3 Minutes -- _4 METs -- 2.09 2.14 1.65 1.65     Oxygen   Maintain Oxygen Saturation -- -- 88% or higher 88% or higher 88% or higher    Row Name 05/25/22 1500 06/22/22 1500           Response to Exercise   Blood Pressure (Admit) 122/70 136/70      Blood Pressure (Exercise) 128/64 --      Blood Pressure (Exit) 112/70 134/72      Heart Rate (Admit) 92 bpm 71 bpm      Heart Rate (Exercise) 134 bpm 118 bpm      Heart Rate (Exit) 92 bpm 81 bpm      Rating of Perceived Exertion (Exercise) 13 13      Symptoms none knee pain 8/10      Duration Continue with 30 min of aerobic exercise without signs/symptoms of physical distress. Continue with 30 min of aerobic exercise without signs/symptoms of physical distress.      Intensity THRR unchanged THRR unchanged         Progression   Progression Continue to progress workloads to maintain intensity without signs/symptoms of physical distress. Continue to progress workloads to maintain intensity without signs/symptoms of physical distress.      Average METs 1.8 2.18        Resistance Training   Training Prescription Yes Yes      Weight 3 lb 3 lb  Reps 10-15 10-15        Interval Training   Interval Training No No        NuStep   Level 2 4      Minutes 15 30      METs -- 15        REL-XR   Level 2 2      Minutes 15 15      METs -- 2        Track   Laps 22 30      Minutes 15 15      METs 2.2 2.63        Oxygen   Maintain Oxygen Saturation 88% or higher 88% or higher               Exercise Comments:   Exercise Comments     Row Name 02/18/22 1001 03/11/22 1036         Exercise Comments First full day of exercise!  Patient was oriented to gym and equipment including functions, settings, policies, and procedures.  Patient's individual exercise prescription and treatment plan were reviewed.  All starting workloads were established based on the results of the 6 minute walk test done at initial orientation visit.  The plan for exercise progression was also introduced and progression will be customized based on patient's performance and goals. Felicia Woods is using nitro sl at least 2 x a day for chest pain with exertion. Advised her to notify MD of this. Also gave her another med name  to review with her physician for angina control.               Exercise Goals and Review:   Exercise Goals     Row Name 02/08/22 1624             Exercise Goals   Increase Physical Activity Yes       Intervention Provide advice, education, support and counseling about physical activity/exercise needs.;Develop an individualized exercise prescription for aerobic and resistive training based on initial evaluation findings, risk stratification, comorbidities and participant's personal goals.        Expected Outcomes Short Term: Attend rehab on a regular basis to increase amount of physical activity.;Long Term: Add in home exercise to make exercise part of routine and to increase amount of physical activity.;Long Term: Exercising regularly at least 3-5 days a week.       Increase Strength and Stamina Yes       Intervention Provide advice, education, support and counseling about physical activity/exercise needs.;Develop an individualized exercise prescription for aerobic and resistive training based on initial evaluation findings, risk stratification, comorbidities and participant's personal goals.       Expected Outcomes Short Term: Increase workloads from initial exercise prescription for resistance, speed, and METs.;Short Term: Perform resistance training exercises routinely during rehab and add in resistance training at home;Long Term: Improve cardiorespiratory fitness, muscular endurance and strength as measured by increased METs and functional capacity (6MWT)       Able to understand and use rate of perceived exertion (RPE) scale Yes       Intervention Provide education and explanation on how to use RPE scale       Expected Outcomes Short Term: Able to use RPE daily in rehab to express subjective intensity level;Long Term:  Able to use RPE to guide intensity level when exercising independently       Able to understand and use Dyspnea scale Yes  Intervention Provide education and explanation on how to use Dyspnea scale       Expected Outcomes Short Term: Able to use Dyspnea scale daily in rehab to express subjective sense of shortness of breath during exertion;Long Term: Able to use Dyspnea scale to guide intensity level when exercising independently       Knowledge and understanding of Target Heart Rate Range (THRR) Yes       Intervention Provide education and explanation of THRR including how the numbers were predicted and where they are located for reference       Expected Outcomes Short  Term: Able to state/look up THRR;Short Term: Able to use daily as guideline for intensity in rehab;Long Term: Able to use THRR to govern intensity when exercising independently       Able to check pulse independently Yes       Intervention Provide education and demonstration on how to check pulse in carotid and radial arteries.;Review the importance of being able to check your own pulse for safety during independent exercise       Expected Outcomes Short Term: Able to explain why pulse checking is important during independent exercise;Long Term: Able to check pulse independently and accurately       Understanding of Exercise Prescription Yes       Intervention Provide education, explanation, and written materials on patient's individual exercise prescription       Expected Outcomes Short Term: Able to explain program exercise prescription;Long Term: Able to explain home exercise prescription to exercise independently                Exercise Goals Re-Evaluation :  Exercise Goals Re-Evaluation     Row Name 02/18/22 1002 03/02/22 1521 03/18/22 1749 03/29/22 1404 04/13/22 1513     Exercise Goal Re-Evaluation   Exercise Goals Review Able to understand and use rate of perceived exertion (RPE) scale;Knowledge and understanding of Target Heart Rate Range (THRR);Able to understand and use Dyspnea scale;Able to check pulse independently;Understanding of Exercise Prescription Increase Physical Activity;Increase Strength and Stamina;Understanding of Exercise Prescription Increase Physical Activity;Increase Strength and Stamina;Understanding of Exercise Prescription Increase Physical Activity;Increase Strength and Stamina;Understanding of Exercise Prescription Increase Physical Activity;Increase Strength and Stamina;Understanding of Exercise Prescription   Comments Reviewed RPE and dyspnea scales, THR and program prescription with pt today.  Pt voiced understanding and was given a copy of goals to take home.  Felicia Woods is off to a good start in rehab. She has only worked on the arm ergometer and track so far, however, was able to complete full 20 laps on the track! Her HR reached her THR. We will continue to monitor her progress as she progresses in the program. Felicia Woods is doing well in rehab. She recently improved her average overall MET level to 2.81 METs. She also improved to level 3 on the XR and got up to 21 laps on the track. We will continue to monitor her progress. Felicia Woods is doing well in rehab. She has done as many as 21 laps, but is now back down to 12 laps.  She is on level 2 for the XR.  We will talk to her about getting consistent with workloads to see improvement. Felicia Woods had another MI and was in the hospital from 10/03-10/05. We will reach out to her and see if she wants to get clearance from her doctor or get a new referral.   Expected Outcomes Short: Use RPE daily to regulate intensity. Long: Follow program prescription in THR.  Short: Continue to work up laps on the track Long: Work on increasing overall MET level Short: Continue to increase workloads and laps on the track. Long: Continue to increase strength and stamina. Short: Attend rehab consistently and keep workloads consistent  Long: Cointinue to improve stamina Short: Return to rehab. Long: Continue to increase strength and stamina.    Felicia Woods Name 04/26/22 1056 05/04/22 1000 05/12/22 1625 05/18/22 1054 05/25/22 1505     Exercise Goal Re-Evaluation   Exercise Goals Review Increase Physical Activity;Increase Strength and Stamina;Understanding of Exercise Prescription Increase Physical Activity;Increase Strength and Stamina;Understanding of Exercise Prescription Increase Physical Activity;Increase Strength and Stamina;Understanding of Exercise Prescription Increase Physical Activity;Increase Strength and Stamina;Understanding of Exercise Prescription Increase Physical Activity;Increase Strength and Stamina;Understanding of Exercise Prescription    Comments Staff spoke with Kember to let her know she was cleared to come back to the program, per her Pulmonologist follow up appt. Patient plans to resume this week. Skyann returned today.  She has been out since being hospitalized with another heart event. She has not been exercising since discharge as she has been working more. Felicia Woods returned to rehab after being hospitalized with another heart event. She has only been to one session since returning. During that session she was able to walk 12 laps on the track and tolerated 3 lb hand weights for resistance training. We will continue to monitor her progress in the program. Felicia Woods is doing well in rehab.  She is walking and using her pedal machine and doing weights at home.  She is feeling like her stamina is getting better.  She knows that she feels better on days that she exercises versus when she does not come. Felicia Woods continues to do well in rehab. She is back up to walking 22 laps on the track! We hope to see that continue going up. She has been consistently at level 2 on the T4 Nustep and would benefit from going to level 3. She is limited by knee pain. She hits her THR most sessions. Her attendance is sporadic and expect more consistency. Will continue to monitor.   Expected Outcomes Short: Return to rehab and ease into it Long: Build up METS and overall strength Short; Return to regular rehab attendance Long; Return to routine exercise daily Short: Return to regular rehab attendance Long: Continue to increase strength and stamina. Short: Continue to exercise on his off days Long: Conitnue to improve stamina Short: Continue to increase laps on track, next time up to 25 Long: Continue to increase overall MET level    Row Name 06/10/22 1524 06/15/22 0937 06/22/22 1522         Exercise Goal Re-Evaluation   Exercise Goals Review Increase Physical Activity;Increase Strength and Stamina;Understanding of Exercise Prescription Increase Physical  Activity;Increase Strength and Stamina;Understanding of Exercise Prescription Increase Physical Activity;Increase Strength and Stamina;Understanding of Exercise Prescription     Comments Felicia Woods returned to rehab on 06/08/2022 after being out since 05/18/2022. She has done well since returning to rehab. We will continue to monitor her progress in the program. Felicia Woods is doing well in rehab.  She is scheduled for another stent this week as her chest pain has been limiting her activity.  She is walking some on her off days in the house.  She is hoping to feel like a new person after her stent this week.  She is excited about it! Felicia Woods was out last week as she had a heart cath, but ended up not doing any PCI  or intervention. Patient is still experiencing some intermittent chest pain and she is supposed to see her doctor on 12/20 to discuss her next steps. Here at rehab, she was able to still walk 30 laps around the track which is the most she has done! We hope to see her continue to improve and will talk with patient on next steps pending what her MD says Wednesday (continues sessions vs. D/c). Will monitor until then.     Expected Outcomes Short: Continue to attend rehab regularly, and return to exercise. Long: Continue to increase strength and stamina. -- Short: Talk to doctor on 12/20, resume rehab back when cleared and ready Long: Graduate from Heart Track program              Discharge Exercise Prescription (Final Exercise Prescription Changes):  Exercise Prescription Changes - 06/22/22 1500       Response to Exercise   Blood Pressure (Admit) 136/70    Blood Pressure (Exit) 134/72    Heart Rate (Admit) 71 bpm    Heart Rate (Exercise) 118 bpm    Heart Rate (Exit) 81 bpm    Rating of Perceived Exertion (Exercise) 13    Symptoms knee pain 8/10    Duration Continue with 30 min of aerobic exercise without signs/symptoms of physical distress.    Intensity THRR unchanged      Progression    Progression Continue to progress workloads to maintain intensity without signs/symptoms of physical distress.    Average METs 2.18      Resistance Training   Training Prescription Yes    Weight 3 lb    Reps 10-15      Interval Training   Interval Training No      NuStep   Level 4    Minutes 30    METs 15      REL-XR   Level 2    Minutes 15    METs 2      Track   Laps 30    Minutes 15    METs 2.63      Oxygen   Maintain Oxygen Saturation 88% or higher             Nutrition:  Target Goals: Understanding of nutrition guidelines, daily intake of sodium <1544m, cholesterol <2018m calories 30% from fat and 7% or less from saturated fats, daily to have 5 or more servings of fruits and vegetables.  Education: All About Nutrition: -Group instruction provided by verbal, written material, interactive activities, discussions, models, and posters to present general guidelines for heart healthy nutrition including fat, fiber, MyPlate, the role of sodium in heart healthy nutrition, utilization of the nutrition label, and utilization of this knowledge for meal planning. Follow up email sent as well. Written material given at graduation. Flowsheet [facility]  Education need identified 02/08/22       Biometrics:  Pre Biometrics - 02/08/22 1627       Pre Biometrics   Height 5' 5.75" (1.67 m)    Weight 234 lb 1.6 oz (106.2 kg)    BMI (Calculated) 38.07    Single Leg Stand 6.6 seconds              Nutrition Therapy Plan and Nutrition Goals:  Nutrition Therapy & Goals - 06/15/22 0801       Nutrition Therapy   Diet Heart healthy, low Na, T2DM    Drug/Food Interactions Statins/Certain Fruits  Protein (specify units) 90-100g    Fiber 25 grams    Whole Grain Foods 3 servings    Saturated Fats 12 max. grams    Fruits and Vegetables 8 servings/day    Sodium 1.5 grams      Personal Nutrition Goals    Nutrition Goal ST: limit eating out <1-2x/week, eat consistently throughout the day including more whole/minimally processed snacks like fruit with peanut butter, practice MyPlate guidelines LT: follow MyPlate guidelines, maintain A1C <7, limit sodium <1.5g/day, include at least 25g of fiber/day    Comments 66 y.o. F admitted to cardiac rehab s/p coronary artery stent placement. PMHx includes asthma, COPD, HTN, HLD, T2DM. Relevant medications includes trulicity, jardiance, famotidine, trelegy, furosemide, ambien. B: does not eat due to work L: grilled chicken sandwich (zaxbys) with fruit  and water with flavoring D: last night: BBQ pork chops with macaroni and cheese with a lot of spinach (she loves green leafy vegetables) and small juice. S: rolled oats 15 minutes before bedtime. Drinks: water, 1 pepsi (regular). She reports that usually she does not feel hungry and that she is "too tired to be hungry". BG has been 120s-140s: morning, after lunch, and dinner. Her A1C is around 7. Felicia Woods reports going out to eat for dinner at least 2-3x/week. Discussed how when going out to eat, sodium is likely high no matter what you end up getting and suggested eating out less than 1-2x/week as she already eats out for lunch. Suggested including morning snack such as a boiled egg or peanut butter with fruit. Encouraged her to eat her rolled oats at least an hour before bed instead of 15 minutes before bed. Encouarged a variety of non-starchy vegetables and importance fiber-rich foods; she reports not enjoying most whole grains aside from oatmeal. Reviewed heart healthy eating and diabetes friendly eating.      Intervention Plan   Intervention Prescribe, educate and counsel regarding individualized specific dietary modifications aiming towards targeted core components such as weight, hypertension, lipid management, diabetes, heart failure and other comorbidities.    Expected Outcomes Short Term Goal: Understand basic  principles of dietary content, such as calories, fat, sodium, cholesterol and nutrients.;Short Term Goal: A plan has been developed with personal nutrition goals set during dietitian appointment.;Long Term Goal: Adherence to prescribed nutrition plan.             Nutrition Assessments:  MEDIFICTS Score Key: ?70 Need to make dietary changes  40-70 Heart Healthy Diet ? 40 Therapeutic Level Cholesterol Diet  Flowsheet Row Cardiac Rehab from 02/08/2022 in [facility] Cardiac and Pulmonary Rehab  Picture Your Plate Total Score on Admission 41      Picture Your Plate Scores: <49 Unhealthy dietary pattern with much room for improvement. 41-50 Dietary pattern unlikely to meet recommendations for good health and room for improvement. 51-60 More healthful dietary pattern, with some room for improvement.  >60 Healthy dietary pattern, although there may be some specific behaviors that could be improved.    Nutrition Goals Re-Evaluation:  Nutrition Goals Re-Evaluation     Row Name 05/05/22 0656 05/18/22 1109           Goals   Nutrition Goal Set up appt with dietitian Set up appt with dietitian      Comment Felicia Woods has been out for over a month and has not had an appointment with Melissa yet. Has appointment on 11/6      Expected Outcome Set up appointment with dietitian Meet with dietitian  Nutrition Goals Discharge (Final Nutrition Goals Re-Evaluation):  Nutrition Goals Re-Evaluation - 05/18/22 1109       Goals   Nutrition Goal Set up appt with dietitian    Comment Has appointment on 11/6    Expected Outcome Meet with dietitian             Psychosocial: Target Goals: Acknowledge presence or absence of significant depression and/or stress, maximize coping skills, provide positive support system. Participant is able to verbalize types and ability to use techniques and skills needed for reducing stress and depression.   Education: Stress, Anxiety, and  Depression - Group verbal and visual presentation to define topics covered.  Reviews how body is impacted by stress, anxiety, and depression.  Also discusses healthy ways to reduce stress and to treat/manage anxiety and depression.  Written material given at graduation. Flowsheet Row Cardiac Rehab from 06/10/2022 in [facility] Cardiac and Pulmonary Rehab  Education need identified 02/08/22       Education: Sleep Hygiene -Provides group verbal and written instruction about how sleep can affect your health.  Define sleep hygiene, discuss sleep cycles and impact of sleep habits. Review good sleep hygiene tips.    Initial Review & Psychosocial Screening:  Initial Psych Review & Screening - 01/19/22 0908       Initial Review   Current issues with None Identified      Family Dynamics   Good Support System? Yes   husband and her children     Barriers   Psychosocial barriers to participate in program There are no identifiable barriers or psychosocial needs.;The patient should benefit from training in stress management and relaxation.      Screening Interventions   Interventions Encouraged to exercise;To provide support and resources with identified psychosocial needs;Provide feedback about the scores to participant    Expected Outcomes Short Term goal: Utilizing psychosocial counselor, staff and physician to assist with identification of specific Stressors or current issues interfering with healing process. Setting desired goal for each stressor or current issue identified.;Long Term Goal: Stressors or current issues are controlled or eliminated.;Short Term goal: Identification and review with participant of any Quality of Life or Depression concerns found by scoring the questionnaire.;Long Term goal: The participant improves quality of Life and PHQ9 Scores as seen by post scores and/or verbalization of changes             Quality of Life Scores:   Quality of Life - 02/08/22 1630        Quality of Life   Select Quality of Life      Quality of Life Scores   Health/Function Pre 23.93 %    Socioeconomic Pre 28.17 %    Psych/Spiritual Pre 30 %    Family Pre 30 %    GLOBAL Pre 27 %            Scores of 19 and below usually indicate a poorer quality of life in these areas.  A difference of  2-3 points is a clinically meaningful difference.  A difference of 2-3 points in the total score of the Quality of Life Index has been associated with significant improvement in overall quality of life, self-image, physical symptoms, and general health in studies assessing change in quality of life.  PHQ-9: Review Flowsheet  More data exists      04/20/2022 02/08/2022 12/03/2021 09/03/2021 03/12/2021  Depression screen PHQ 2/9  Decreased Interest 0 1 0 0 0  Down, Depressed, Hopeless 0 0 0 0 0  PHQ - 2 Score 0 1 0 0 0  Altered sleeping - 0 - - -  Tired, decreased energy - 0 - - -  Change in appetite - 0 - - -  Feeling bad or failure about yourself  - 0 - - -  Trouble concentrating - 0 - - -  Moving slowly or fidgety/restless - 0 - - -  Suicidal thoughts - 0 - - -  PHQ-9 Score - 1 - - -  Difficult doing work/chores - Not difficult at all - - -   Interpretation of Total Score  Total Score Depression Severity:  1-4 = Minimal depression, 5-9 = Mild depression, 10-14 = Moderate depression, 15-19 = Moderately severe depression, 20-27 = Severe depression   Psychosocial Evaluation and Intervention:  Psychosocial Evaluation - 01/19/22 0912       Psychosocial Evaluation & Interventions   Comments Felicia Woods is again returning to [facility] after another stent. SHe has no barriers to attending the program. She now lives in a house and no longer in a second floor apartment. Her support team is her husband and her children. She is ready to return and continue to learn heart healthy living.    Expected Outcomes STG Felicia Woods will attend all scheduled sessions, she will progress with her exercise LTG Felicia Woods  will be able to continue with ehr exercie progression and maintain control of her risk factors.    Continue Psychosocial Services  Follow up required by staff             Psychosocial Re-Evaluation:  Psychosocial Re-Evaluation     [place] Name 05/04/22 1000 05/18/22 1056 06/15/22 0939         Psychosocial Re-Evaluation   Current issues with Current Stress Concerns Current Stress Concerns Current Stress Concerns     Comments Felicia Woods returned today after last heart event has had her out for over a month.  She tries not to let too much get to her, but they are short staffed at work.  This makes her feel over worked and unable to do much else. Felicia Woods is doing well in rehab.  She is off to a good restart.  Her health continues to be her biggest stressor.  She is learning to work with her work stress.  She is sleeping better than ever.  She enjoyes coming to class. Felicia Woods is doing well in rehab.  Her chest pain has been her biggest stressor.  She is scheduled for a new stent this week.  She is sleeping well with her amiben and has not had any problems.  She is hoping to feel great after her new stent     Expected Outcomes Short; Return to routine exercise for mental boost Long: continue to cope with work Short: Continue to exercise for mental health Long; continue to stay positive Short: Get stent and stay positive Long: Continue to exercise for mental boost     Interventions Encouraged to attend Cardiac Rehabilitation for the exercise;Stress management education Encouraged to attend Cardiac Rehabilitation for the exercise;Stress management education Encouraged to attend Cardiac Rehabilitation for the exercise;Stress management education     Continue Psychosocial Services  Follow up required by staff Follow up required by staff Follow up required by staff              Psychosocial Discharge (Final Psychosocial Re-Evaluation):  Psychosocial Re-Evaluation - 06/15/22 0939       Psychosocial  Re-Evaluation   Current issues with Current Stress Concerns  Comments Felicia Woods is doing well in rehab.  Her chest pain has been her biggest stressor.  She is scheduled for a new stent this week.  She is sleeping well with her amiben and has not had any problems.  She is hoping to feel great after her new stent    Expected Outcomes Short: Get stent and stay positive Long: Continue to exercise for mental boost    Interventions Encouraged to attend Cardiac Rehabilitation for the exercise;Stress management education    Continue Psychosocial Services  Follow up required by staff             Vocational Rehabilitation: Provide vocational rehab assistance to qualifying candidates.   Vocational Rehab Evaluation & Intervention:  Vocational Rehab - 01/19/22 0912       Initial Vocational Rehab Evaluation & Intervention   Assessment shows need for Vocational Rehabilitation No      Vocational Rehab Re-Evaulation   Comments no request fro VR             Education: Education Goals: Education classes will be provided on a variety of topics geared toward better understanding of heart health and risk factor modification. Participant will state understanding/return demonstration of topics presented as noted by education test scores.  Learning Barriers/Preferences:   General Cardiac Education Topics:  AED/CPR: - Group verbal and written instruction with the use of models to demonstrate the basic use of the AED with the basic ABC's of resuscitation.   Anatomy and Cardiac Procedures: - Group verbal and visual presentation and models provide information about basic cardiac anatomy and function. Reviews the testing methods done to diagnose heart disease and the outcomes of the test results. Describes the treatment choices: Medical Management, Angioplasty, or Coronary Bypass Surgery for treating various heart conditions including Myocardial Infarction, Angina, Valve Disease, and Cardiac  Arrhythmias.  Written material given at graduation. Flowsheet Row Cardiac Rehab from 06/10/2022 in [facility] Cardiac and Pulmonary Rehab  Education need identified 02/08/22  Date 06/10/22  Educator sb  Instruction Review Code 1- Verbalizes Understanding       Medication Safety: - Group verbal and visual instruction to review commonly prescribed medications for heart and lung disease. Reviews the medication, class of the drug, and side effects. Includes the steps to properly store meds and maintain the prescription regimen.  Written material given at graduation.   Intimacy: - Group verbal instruction through game format to discuss how heart and lung disease can affect sexual intimacy. Written material given at graduation..   Know Your Numbers and Heart Failure: - Group verbal and visual instruction to discuss disease risk factors for cardiac and pulmonary disease and treatment options.  Reviews associated critical values for Overweight/Obesity, Hypertension, Cholesterol, and Diabetes.  Discusses basics of heart failure: signs/symptoms and treatments.  Introduces Heart Failure Zone chart for action plan for heart failure.  Written material given at graduation.   Infection Prevention: - Provides verbal and written material to individual with discussion of infection control including proper hand washing and proper equipment cleaning during exercise session. Flowsheet Row Cardiac Rehab from 06/10/2022 in [facility] Cardiac and Pulmonary Rehab  Date 02/08/22  Educator [facility]  Instruction Review Code 1- Verbalizes Understanding       Falls Prevention: - Provides verbal and written material to individual with discussion of falls prevention and safety. Flowsheet Row Cardiac Rehab from 06/10/2022 in [facility] Cardiac and Pulmonary Rehab  Date 02/08/22  Educator [facility]  Instruction Review Code 1- [employer]  Other: -Provides group and verbal instruction on various topics (see  comments)   Knowledge Questionnaire Score:  Knowledge Questionnaire Score - 02/08/22 1631       Knowledge Questionnaire Score   Pre Score 19/26             Core Components/Risk Factors/Patient Goals at Admission:  Personal Goals and Risk Factors at Admission - 02/08/22 1628       Core Components/Risk Factors/Patient Goals on Admission    Weight Management Yes    Intervention Weight Management: Develop a combined nutrition and exercise program designed to reach desired caloric intake, while maintaining appropriate intake of nutrient and fiber, sodium and fats, and appropriate energy expenditure required for the weight goal.;Weight Management: Provide education and appropriate resources to help participant work on and attain dietary goals.;Weight Management/Obesity: Establish reasonable short term and long term weight goals.;Obesity: Provide education and appropriate resources to help participant work on and attain dietary goals.    Admit Weight 234 lb 1.6 oz (106.2 kg)    Goal Weight: Short Term 228 lb (103.4 kg)    Goal Weight: Long Term 200 lb (90.7 kg)    Expected Outcomes Short Term: Continue to assess and modify interventions until short term weight is achieved;Long Term: Adherence to nutrition and physical activity/exercise program aimed toward attainment of established weight goal;Weight Loss: Understanding of general recommendations for a balanced deficit meal plan, which promotes 1-2 lb weight loss per week and includes a negative energy balance of [phone] kcal/d;Understanding recommendations for meals to include 15-35% energy as protein, 25-35% energy from fat, 35-60% energy from carbohydrates, less than 251m of dietary cholesterol, 20-35 gm of total fiber daily;Understanding of distribution of calorie intake throughout the day with the consumption of 4-5 meals/snacks    Diabetes Yes    Intervention Provide education about signs/symptoms and action to take for  hypo/hyperglycemia.;Provide education about proper nutrition, including hydration, and aerobic/resistive exercise prescription along with prescribed medications to achieve blood glucose in normal ranges: Fasting glucose 65-99 mg/dL    Expected Outcomes Short Term: Participant verbalizes understanding of the signs/symptoms and immediate care of hyper/hypoglycemia, proper foot care and importance of medication, aerobic/resistive exercise and nutrition plan for blood glucose control.;Long Term: Attainment of HbA1C < 7%.    Hypertension Yes    Intervention Provide education on lifestyle modifcations including regular physical activity/exercise, weight management, moderate sodium restriction and increased consumption of fresh fruit, vegetables, and low fat dairy, alcohol moderation, and smoking cessation.;Monitor prescription use compliance.    Expected Outcomes Short Term: Continued assessment and intervention until BP is < 140/977mHG in hypertensive participants. < 130/8064mG in hypertensive participants with diabetes, heart failure or chronic kidney disease.;Long Term: Maintenance of blood pressure at goal levels.    Lipids Yes    Intervention Provide education and support for participant on nutrition & aerobic/resistive exercise along with prescribed medications to achieve LDL <60m30mDL >40mg66m Expected Outcomes Short Term: Participant states understanding of desired cholesterol values and is compliant with medications prescribed. Participant is following exercise prescription and nutrition guidelines.;Long Term: Cholesterol controlled with medications as prescribed, with individualized exercise RX and with personalized nutrition plan. Value goals: LDL < 60mg,61m > 40 mg.             Education:Diabetes - Individual verbal and written instruction to review signs/symptoms of diabetes, desired ranges of glucose level fasting, after meals and with exercise. Acknowledge that pre and post exercise  glucose checks will be done  for 3 sessions at entry of program. Flowsheet Row Cardiac Rehab from 06/10/2022 in [facility] Cardiac and Pulmonary Rehab  Education need identified 02/08/22       Core Components/Risk Factors/Patient Goals Review:   Goals and Risk Factor Review     Row Name 05/04/22 1000 05/18/22 1109 06/15/22 0941         Core Components/Risk Factors/Patient Goals Review   Personal Goals Review Weight Management/Obesity;Hypertension;Diabetes Weight Management/Obesity;Hypertension;Diabetes Weight Management/Obesity;Hypertension;Diabetes     Review Felicia Woods returned today after being out for over a month with a another heart event.  Her weight is up some since she started, but she has not been exercising.  Her pressures have been good and sugars are stable.  She hopes that getting back to exercise again will help with weight loss Felicia Woods continues to work on weight loss.  She has been holding steady still, but not gaining.  She contineus to have better control over her sugars. Her pressures are doing well.  She feels good with her meds. Felicia Woods is doing well in rehab.  She is still working on weight loss.  She is scheduled for another stent on Thursday.  Her sugars have been doing well and pressures too.     Expected Outcomes Short; Reutrn to steady attendance in rehab Long: Contineu to monitor risk factors Short: Contineu to work on weight loss Long: Conitnue to monitor sugars Short: Get new stent and get clearance to return Long: Conitnue monitor progress.              Core Components/Risk Factors/Patient Goals at Discharge (Final Review):   Goals and Risk Factor Review - 06/15/22 0941       Core Components/Risk Factors/Patient Goals Review   Personal Goals Review Weight Management/Obesity;Hypertension;Diabetes    Review Felicia Woods is doing well in rehab.  She is still working on weight loss.  She is scheduled for another stent on Thursday.  Her sugars have been doing well and pressures  too.    Expected Outcomes Short: Get new stent and get clearance to return Long: Conitnue monitor progress.             ITP Comments:  ITP Comments     Row Name 01/19/22 0915 02/08/22 1611 02/10/22 1006 02/18/22 1001 03/10/22 1415   ITP Comments Virtual orientation call completed today. shehas an appointment on Date: 01/26/2022  for EP eval and gym Orientation.  Documentation of diagnosis can be found in [facility] Date: 12/24/2021 . Completed 6MWT and gym orientation. Initial ITP created and sent for review to Dr. Emily Filbert, Medical Director. 30 Day review completed. Medical Director ITP review done, changes made as directed, and signed approval by Medical Director.   NEW First full day of exercise!  Patient was oriented to gym and equipment including functions, settings, policies, and procedures.  Patient's individual exercise prescription and treatment plan were reviewed.  All starting workloads were established based on the results of the 6 minute walk test done at initial orientation visit.  The plan for exercise progression was also introduced and progression will be customized based on patient's performance and goals. 30 Day review completed. Medical Director ITP review done, changes made as directed, and signed approval by Medical Director.   NEW    Row Name 03/11/22 1035 04/06/22 1050 04/07/22 0809 04/15/22 1513 04/26/22 1055   ITP Comments Haidee is using nitro sl at least 2 x a day for chest pain with exertion. Advised her to notify MD of this. Also  gave her another med name  to review with her physician for angina control. Patient called to let us know she is getting admitted to the hospital for cardiac issues. Will place patient on medical hold from rehab and will need clearance for her to return. 30 Day review completed. Medical Director ITP review done, changes made as directed, and signed approval by Medical Director.   Out for medical reason Mabell called to let us know that she does not  have a follow up appt until 11/22 from her recent hospitalization for MI.  She would like to finish the program. Staff spoke with Jamicia to let her know she was cleared to come back to the program, per her Pulmonologist follow up appt. Patient plans to resume this week.    [place] Name 05/05/22 1039 06/02/22 1103 06/15/22 0938 06/15/22 1000 06/21/22 1516   ITP Comments 30 Day review completed. Medical Director ITP review done, changes made as directed, and signed approval by Medical Director. 30 Day review completed. Medical Director ITP review done, changes made as directed, and signed approval by Medical Director. New stent placement scheduled for 12/14. Completed initial nutrition consultation Called patient, she had a cath completed 12/14, but states no PCI. She states she is supposed to see her cardiologist on Wed, 12/20 to discuss next steps. Patient to call us after she sees doctor on when she is cleared to return.    [place] Name 06/30/22 1129           ITP Comments 30 Day review completed. Medical Director ITP review done, changes made as directed, and signed approval by Medical Director.                Comments:

## 2022-07-01 ENCOUNTER — Encounter: Payer: Medicare Other | Admitting: *Deleted

## 2022-07-06 ENCOUNTER — Ambulatory Visit: Payer: Medicare Other | Attending: Gastroenterology | Admitting: Physical Therapy

## 2022-07-06 ENCOUNTER — Ambulatory Visit: Payer: Medicare Other

## 2022-07-06 ENCOUNTER — Encounter: Payer: Self-pay | Admitting: *Deleted

## 2022-07-06 DIAGNOSIS — Z955 Presence of coronary angioplasty implant and graft: Secondary | ICD-10-CM

## 2022-07-06 NOTE — Progress Notes (Signed)
Felicia Woods has called out for this week. She has pink eye. Her eye is swollen shut

## 2022-07-08 ENCOUNTER — Other Ambulatory Visit: Payer: Self-pay | Admitting: Physician Assistant

## 2022-07-08 ENCOUNTER — Other Ambulatory Visit: Payer: Self-pay | Admitting: Internal Medicine

## 2022-07-08 DIAGNOSIS — G4709 Other insomnia: Secondary | ICD-10-CM

## 2022-07-09 ENCOUNTER — Ambulatory Visit: Payer: Medicare Other

## 2022-07-09 NOTE — Telephone Encounter (Signed)
Please send

## 2022-07-13 ENCOUNTER — Ambulatory Visit: Payer: Medicare Other | Admitting: Physical Therapy

## 2022-07-13 ENCOUNTER — Ambulatory Visit: Payer: Medicare Other

## 2022-07-16 ENCOUNTER — Ambulatory Visit: Payer: Medicare Other

## 2022-07-20 ENCOUNTER — Telehealth: Payer: Self-pay | Admitting: *Deleted

## 2022-07-20 ENCOUNTER — Ambulatory Visit: Payer: Medicare Other

## 2022-07-20 ENCOUNTER — Encounter: Payer: Medicare Other | Admitting: Physical Therapy

## 2022-07-20 NOTE — Telephone Encounter (Signed)
Called to check in on patient.  She has been out since cath in December. Last visit was 06/15/22.  Left message.

## 2022-07-22 ENCOUNTER — Encounter: Payer: Self-pay | Admitting: Physician Assistant

## 2022-07-22 ENCOUNTER — Ambulatory Visit (INDEPENDENT_AMBULATORY_CARE_PROVIDER_SITE_OTHER): Payer: Medicare Other | Admitting: Physician Assistant

## 2022-07-22 VITALS — BP 134/81 | HR 93 | Temp 97.7°F | Resp 16 | Ht 67.0 in | Wt 234.2 lb

## 2022-07-22 DIAGNOSIS — E1159 Type 2 diabetes mellitus with other circulatory complications: Secondary | ICD-10-CM | POA: Diagnosis not present

## 2022-07-22 DIAGNOSIS — E538 Deficiency of other specified B group vitamins: Secondary | ICD-10-CM | POA: Diagnosis not present

## 2022-07-22 DIAGNOSIS — E782 Mixed hyperlipidemia: Secondary | ICD-10-CM | POA: Diagnosis not present

## 2022-07-22 DIAGNOSIS — I10 Essential (primary) hypertension: Secondary | ICD-10-CM

## 2022-07-22 DIAGNOSIS — E559 Vitamin D deficiency, unspecified: Secondary | ICD-10-CM

## 2022-07-22 DIAGNOSIS — R5383 Other fatigue: Secondary | ICD-10-CM

## 2022-07-22 LAB — POCT GLYCOSYLATED HEMOGLOBIN (HGB A1C): Hemoglobin A1C: 7.9 % — AB (ref 4.0–5.6)

## 2022-07-22 MED ORDER — TRULICITY 3 MG/0.5ML ~~LOC~~ SOAJ: 3.0000 mg | SUBCUTANEOUS | 2 refills | Status: DC

## 2022-07-22 MED ORDER — PROAIR RESPICLICK 108 (90 BASE) MCG/ACT IN AEPB: 2.0000 | INHALATION_SPRAY | Freq: Four times a day (QID) | RESPIRATORY_TRACT | 1 refills | Status: AC | PRN

## 2022-07-22 MED ORDER — ONETOUCH VERIO VI STRP: ORAL_STRIP | 3 refills | Status: DC

## 2022-07-22 MED ORDER — ONETOUCH VERIO FLEX SYSTEM W/DEVICE KIT: PACK | 0 refills | Status: AC

## 2022-07-22 MED ORDER — ONETOUCH DELICA LANCETS 30G MISC: 1 refills | Status: DC

## 2022-07-22 MED ORDER — FLUCONAZOLE 150 MG PO TABS: 150.0000 mg | ORAL_TABLET | Freq: Once | ORAL | 0 refills | Status: AC

## 2022-07-22 NOTE — Progress Notes (Signed)
[facility] [place], [place] [address]  Internal MEDICINE  Office Visit Note  Patient Name: Felicia Woods  [id number]  [id number]  Date of Service: 07/27/2022  Chief Complaint  Patient presents with   Follow-up   Diabetes   Hyperlipidemia   Hypertension   Quality Metric Gaps    Shingles and Dexa Scan needed    HPI Pt is here for routine follow up -She has a letter from insurance stating she needs to switch to [place] life scan meter for supply coverage and needs alternative to albuterol HFA as well -Going to be getting eye exam, and then also podiatry visit next week -Burning/irritation in vagina recently and will try round of diflucan to see if this helps. Denies discharge or urinary symptoms. -BG in Am 110 -Cardiac rehab pause from being sick and will be starting back up soon -due for routine fasting labs  Current Medication: Outpatient Encounter Medications as of 07/22/2022  Medication Sig Note   amLODipine (NORVASC) 10 MG tablet Take 1 tablet by mouth daily.    aspirin EC 81 MG tablet Take 81 mg by mouth daily.    atorvastatin (LIPITOR) 40 MG tablet Take 1 tablet (40 mg total) by mouth daily.    azithromycin (ZITHROMAX) 250 MG tablet Take one tab a day for 10 days for uri    chlorpheniramine-HYDROcodone (TUSSIONEX) 10-8 MG/5ML Take 5 mLs by mouth every 12 (twelve) hours as needed for cough. 06/17/2022: Never took bc insurance would not cover    clopidogrel (PLAVIX) 75 MG tablet Take 1 tablet by mouth daily.    cyclobenzaprine (FLEXERIL) 10 MG tablet Take 1 tablet by mouth at bedtime.    Dulaglutide (TRULICITY) 3 QJ/3.3LK SOPN Inject 3 mg as directed once a week.    empagliflozin (JARDIANCE) 25 MG TABS tablet Take one tab a day for diabetes    famotidine (PEPCID) 20 MG tablet Take 1 tablet by mouth 2 (two) times daily.    [EXPIRED] fluconazole (DIFLUCAN) 150 MG tablet Take 1 tablet (150 mg total) by mouth once for 1 dose.     Fluticasone-Umeclidin-Vilant (TRELEGY ELLIPTA) 100-62.5-25 MCG/ACT AEPB INHALE 1 PUFF INTO THE LUNGS ONCE DAILY    furosemide (LASIX) 40 MG tablet Take 1 tablet (40 mg total) by mouth 2 (two) times daily.    isosorbide mononitrate (IMDUR) 60 MG 24 hr tablet Take one tab po qhs for heart (Patient taking differently: 120 mg daily. Take one tab po qhs for heart)    losartan (COZAAR) 100 MG tablet Take 1 tablet by mouth daily.    metoprolol succinate (TOPROL-XL) 25 MG 24 hr tablet Take 1 tablet (25 mg total) by mouth daily.    Misc. Devices (ADJUST BATH/SHOWER SEAT) MISC 1 shower seat for use at home to reduce risk of falling.    montelukast (SINGULAIR) 10 MG tablet Take 1 tablet by mouth at bedtime.    nitroGLYCERIN (NITROSTAT) 0.4 MG SL tablet DISSOLVE ONE TABLET UNDER THE TONGUE EVERY 5 MINUTES AS NEEDED FOR CHEST PAIN.  DO NOT EXCEED A TOTAL OF 3 DOSES IN 15 MINUTES    oxybutynin (DITROPAN) 5 MG tablet Take 1 tablet by mouth daily.    ranolazine (RANEXA) 1000 MG SR tablet Take 1 tablet (1,000 mg total) by mouth 2 (two) times daily.    zolpidem (AMBIEN) 5 MG tablet TAKE 1 TABLET BY MOUTH AT BEDTIME AS NEEDED FOR SLEEP    [DISCONTINUED] Accu-Chek Softclix Lancets lancets Use as instructed twice a day  to check blood sugars  DIAG -E11.59    [DISCONTINUED] albuterol (VENTOLIN HFA) 108 (90 Base) MCG/ACT inhaler Inhale 2 puffs into the lungs every 6 (six) hours as needed for wheezing or shortness of breath.    [DISCONTINUED] Albuterol Sulfate (PROAIR RESPICLICK) 185 (90 Base) MCG/ACT AEPB Inhale into the lungs.    [DISCONTINUED] Blood Glucose Monitoring Suppl ([place]) w/Device KIT by Does not apply route. Use as directed twice a daily DX E11.65    [DISCONTINUED] Dulaglutide (TRULICITY) 1.5 UD/1.4HF SOPN Inject 1.5 mg into the skin once a week.    [DISCONTINUED] glucose blood (ACCU-CHEK GUIDE) test strip Use as instructed to check blood sugars twice a day  E11.59    [DISCONTINUED]  glucose blood (ONETOUCH VERIO) test strip Use as instructed twice daily DX E11.65    [DISCONTINUED] OneTouch Delica Lancets 02O MISC by Does not apply route. Use as directed twice a daily DX E11.65    Blood Glucose Monitoring Suppl (ONETOUCH VERIO FLEX SYSTEM) w/Device KIT Use as directed twice a daily DX E11.65    glucose blood (ONETOUCH VERIO) test strip Use as instructed twice daily DX V78.58    OneTouch Delica Lancets 85O MISC Use as directed twice a daily DX E11.65    PROAIR RESPICLICK 277 (90 Base) MCG/ACT AEPB Inhale 2 puffs into the lungs every 6 (six) hours as needed.    No facility-administered encounter medications on file as of 07/22/2022.    Surgical History: Past Surgical History:  Procedure Laterality Date   ABDOMINAL HYSTERECTOMY     CARDIAC CATHETERIZATION     CARDIAC CATHETERIZATION N/A 10/18/2015   Procedure: Left Heart Cath and Cors/Grafts Angiography;  Surgeon: Lorretta Harp, MD;  Location: [place] [facility];  Service: Cardiovascular;  Laterality: N/A;   CARDIAC CATHETERIZATION N/A 10/18/2015   Procedure: Coronary Stent Intervention;  Surgeon: Lorretta Harp, MD;  Location: [place] [facility];  Service: Cardiovascular;  Laterality: N/A;   CARDIAC SURGERY     CHOLECYSTECTOMY     COLONOSCOPY WITH PROPOFOL N/A 12/02/2021   Procedure: COLONOSCOPY WITH PROPOFOL;  Surgeon: Jonathon Bellows, MD;  Location: [facility];  Service: Gastroenterology;  Laterality: N/A;   CORONARY ANGIOPLASTY     CORONARY ARTERY BYPASS GRAFT     4 vessels - 2010   CORONARY STENT INTERVENTION N/A 02/16/2017   Procedure: CORONARY STENT INTERVENTION;  Surgeon: Yolonda Kida, MD;  Location: [place] [facility];  Service: Cardiovascular;  Laterality: N/A;   CORONARY STENT INTERVENTION N/A 02/13/2019   Procedure: CORONARY STENT INTERVENTION;  Surgeon: Nelva Bush, MD;  Location: [place] [facility];  Service: Cardiovascular;  Laterality: N/A;  SVG to RCA   CORONARY STENT INTERVENTION Left  03/08/2019   Procedure: CORONARY STENT INTERVENTION;  Surgeon: Yolonda Kida, MD;  Location: [place] [facility];  Service: Cardiovascular;  Laterality: Left;   CORONARY STENT INTERVENTION N/A 11/04/2020   Procedure: CORONARY STENT INTERVENTION;  Surgeon: Nelva Bush, MD;  Location: [place] [facility];  Service: Cardiovascular;  Laterality: N/A;   CORONARY STENT INTERVENTION N/A 12/24/2021   Procedure: CORONARY STENT INTERVENTION;  Surgeon: Andrez Grime, MD;  Location: [place] [facility];  Service: Cardiovascular;  Laterality: N/A;   ESOPHAGOGASTRODUODENOSCOPY N/A 12/02/2021   Procedure: ESOPHAGOGASTRODUODENOSCOPY (EGD);  Surgeon: Jonathon Bellows, MD;  Location: [facility] ENDOSCOPY;  Service: Gastroenterology;  Laterality: N/A;   LEFT HEART CATH AND CORONARY ANGIOGRAPHY Left 02/16/2017   Procedure: LEFT HEART CATH AND CORONARY ANGIOGRAPHY;  Surgeon: Serafina Royals  J, MD;  Location: [place] [facility];  Service: Cardiovascular;  Laterality: Left;   LEFT HEART CATH AND CORONARY ANGIOGRAPHY Left 12/24/2021   Procedure: LEFT HEART CATH AND CORONARY ANGIOGRAPHY;  Surgeon: Corey Skains, MD;  Location: [place] [facility];  Service: Cardiovascular;  Laterality: Left;   LEFT HEART CATH AND CORONARY ANGIOGRAPHY Left 06/17/2022   Procedure: LEFT HEART CATH AND CORONARY ANGIOGRAPHY;  Surgeon: Yolonda Kida, MD;  Location: [place] [facility];  Service: Cardiovascular;  Laterality: Left;   LEFT HEART CATH AND CORS/GRAFTS ANGIOGRAPHY Left 11/23/2017   Procedure: LEFT HEART CATH AND CORS/GRAFTS ANGIOGRAPHY;  Surgeon: Corey Skains, MD;  Location: [place] [facility];  Service: Cardiovascular;  Laterality: Left;   LEFT HEART CATH AND CORS/GRAFTS ANGIOGRAPHY N/A 02/13/2019   Procedure: LEFT HEART CATH AND CORS/GRAFTS ANGIOGRAPHY;  Surgeon: Corey Skains, MD;  Location: [place] [facility];  Service: Cardiovascular;  Laterality: N/A;   LEFT HEART CATH AND CORS/GRAFTS ANGIOGRAPHY N/A  07/03/2019   Procedure: LEFT HEART CATH AND CORS/GRAFTS ANGIOGRAPHY;  Surgeon: Corey Skains, MD;  Location: [place] [facility];  Service: Cardiovascular;  Laterality: N/A;   LEFT HEART CATH AND CORS/GRAFTS ANGIOGRAPHY N/A 11/04/2020   Procedure: LEFT HEART CATH AND CORS/GRAFTS ANGIOGRAPHY;  Surgeon: Corey Skains, MD;  Location: [place] [facility];  Service: Cardiovascular;  Laterality: N/A;    Medical History: Past Medical History:  Diagnosis Date   Asthma    Coronary artery disease    Diabetes mellitus without complication ([employer])    Heart attack ([place])    Hyperlipidemia    Hypertension    MI (myocardial infarction) ([place])    Migraine headache with aura    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    Family History: Family History  Problem Relation Age of Onset   Diabetes Mother    Diabetes Father    Cancer Father    Diabetes Brother     Social History   Socioeconomic History   Marital status: Widowed    Spouse name: Jeneen Rinks   Number of children: Not on file   Years of education: Not on file   Highest education level: Not on file  Occupational History   Not on file  Tobacco Use   Smoking status: Never   Smokeless tobacco: Never  Vaping Use   Vaping Use: Never used  Substance and Sexual Activity   Alcohol use: No    Alcohol/week: 0.0 standard drinks of alcohol   Drug use: No   Sexual activity: Yes  Other Topics Concern   Not on file  Social History Narrative   Not on file   Social Determinants of Health   Financial Resource Strain: Not on file  Food Insecurity: Not on file  Transportation Needs: Not on file  Physical Activity: Not on file  Stress: Not on file  Social Connections: Not on file  Intimate Partner Violence: Not on file      Review of Systems  Constitutional:  Negative for chills, fatigue and unexpected weight change.  HENT:  Negative for congestion, rhinorrhea, sneezing and sore throat.   Eyes:  Negative for redness.  Respiratory:   Negative for cough, chest tightness and shortness of breath.   Cardiovascular:  Negative for chest pain and palpitations.  Gastrointestinal:  Negative for abdominal pain, constipation, diarrhea, nausea and vomiting.  Genitourinary:  Positive for vaginal pain. Negative for dysuria, frequency and vaginal bleeding.  Musculoskeletal:  Negative for arthralgias, back  pain, joint swelling and neck pain.  Skin:  Negative for rash.  Neurological: Negative.  Negative for tremors and numbness.  Hematological:  Negative for adenopathy. Does not bruise/bleed easily.  Psychiatric/Behavioral:  Negative for behavioral problems (Depression), sleep disturbance and suicidal ideas. The patient is not nervous/anxious.     Vital Signs: BP 134/81   Pulse 93   Temp 97.7 F (36.5 C)   Resp 16   Ht '5\' 7"'$  (1.702 m)   Wt 234 lb 3.2 oz (106.2 kg)   SpO2 94%   BMI 36.68 kg/m    Physical Exam Vitals and nursing note reviewed.  Constitutional:      Appearance: Normal appearance. She is obese.  HENT:     Head: Normocephalic and atraumatic.     Mouth/Throat:     Mouth: Mucous membranes are moist.     Pharynx: No posterior oropharyngeal erythema.  Eyes:     Extraocular Movements: Extraocular movements intact.     Pupils: Pupils are equal, round, and reactive to light.  Cardiovascular:     Rate and Rhythm: Normal rate and regular rhythm.     Pulses: Normal pulses.     Heart sounds: Normal heart sounds.  Pulmonary:     Effort: Pulmonary effort is normal.     Breath sounds: Normal breath sounds.  Skin:    General: Skin is warm and dry.  Neurological:     General: No focal deficit present.     Mental Status: She is alert.  Psychiatric:        Mood and Affect: Mood normal.        Behavior: Behavior normal.        Assessment/Plan: 1. Type 2 diabetes mellitus with other circulatory complication, without long-term current use of insulin (HCC) - POCT HgB A1C is 7.9 which is improving from 8.3 last  check. Will increase trulicity and continue to work on diet and exercise - Dulaglutide (TRULICITY) 3 RW/4.3XV SOPN; Inject 3 mg as directed once a week.  Dispense: 2 mL; Refill: 2  2. Essential hypertension Stable, continue current medications  3. Mixed hyperlipidemia Continue lipitor and will update labs - Lipid Panel With LDL/HDL Ratio  4. B12 deficiency - B12 and Folate Panel  5. Vitamin D deficiency - VITAMIN D 25 Hydroxy (Vit-D Deficiency, Fractures)  6. Other fatigue - CBC w/Diff/Platelet - Comprehensive metabolic panel - TSH + free T4 - Lipid Panel With LDL/HDL Ratio - B12 and Folate Panel - VITAMIN D 25 Hydroxy (Vit-D Deficiency, Fractures) - Fe+TIBC+Fer   General Counseling: Jasnoor verbalizes understanding of the findings of todays visit and agrees with plan of treatment. I have discussed any further diagnostic evaluation that may be needed or ordered today. We also reviewed her medications today. she has been encouraged to call the office with any questions or concerns that should arise related to todays visit.    Orders Placed This Encounter  Procedures   CBC w/Diff/Platelet   Comprehensive metabolic panel   TSH + free T4   Lipid Panel With LDL/HDL Ratio   B12 and Folate Panel   VITAMIN D 25 Hydroxy (Vit-D Deficiency, Fractures)   Fe+TIBC+Fer   POCT HgB A1C    Meds ordered this encounter  Medications   fluconazole (DIFLUCAN) 150 MG tablet    Sig: Take 1 tablet (150 mg total) by mouth once for 1 dose.    Dispense:  1 tablet    Refill:  0   Dulaglutide (TRULICITY) 3 QM/0.8QP SOPN  Sig: Inject 3 mg as directed once a week.    Dispense:  2 mL    Refill:  2   PROAIR RESPICLICK 794 (90 Base) MCG/ACT AEPB    Sig: Inhale 2 puffs into the lungs every 6 (six) hours as needed.    Dispense:  3 each    Refill:  1   Blood Glucose Monitoring Suppl (ONETOUCH VERIO FLEX SYSTEM) w/Device KIT    Sig: Use as directed twice a daily DX E11.65    Dispense:  1 kit     Refill:  0   glucose blood (ONETOUCH VERIO) test strip    Sig: Use as instructed twice daily DX E11.65    Dispense:  100 each    Refill:  3   OneTouch Delica Lancets 80X MISC    Sig: Use as directed twice a daily DX E11.65    Dispense:  100 each    Refill:  1    This patient was seen by Drema Dallas, PA-C in collaboration with Dr. Clayborn Bigness as a part of collaborative care agreement.   Total time spent:30 Minutes Time spent includes review of chart, medications, test results, and follow up plan with the patient.      Dr Lavera Guise Internal medicine

## 2022-07-23 ENCOUNTER — Ambulatory Visit: Payer: Medicare Other

## 2022-07-26 DIAGNOSIS — Z955 Presence of coronary angioplasty implant and graft: Secondary | ICD-10-CM

## 2022-07-26 NOTE — Progress Notes (Signed)
Discharge Progress Report  Patient Details  Name: Felicia Woods MRN: [id number] Date of Birth: 12-Aug-1955 Referring Provider:   Flowsheet Row Cardiac Rehab from 02/08/2022 in [facility] Cardiac [facility] Pulmonary [facility]  Referring Provider [place]        Number of Visits: 18  Reason for Discharge:  Early Exit:  Personal, family issues  Diagnosis:  S/P coronary artery stent placement  Initial Exercise Prescription:  Initial Exercise Prescription - 02/08/22 1600       Date of Initial Exercise RX and Referring Provider   Date 02/08/22    Referring Provider Corky Sox      Oxygen   Maintain Oxygen Saturation 88% or higher      NuStep   Level 1    SPM 80    Minutes 15    METs 1.65      Arm Ergometer   Level 1    RPM 30    Minutes 15    METs 1.65      REL-XR   Level 1    Speed 50    Minutes 15    METs 1.65      Biostep-RELP   Level 1    SPM 50    Minutes 15    METs 1.65      Track   Laps 7    Minutes 15    METs 1.38      Intensity   THRR 40-80% of Max Heartrate 112-141    Ratings of Perceived Exertion 11-13    Perceived Dyspnea 0-4      Progression   Progression Continue to progress workloads to maintain intensity without signs/symptoms of physical distress.      Resistance Training   Training Prescription Yes    Weight 3    Reps 10-15             Discharge Exercise Prescription (Final Exercise Prescription Changes):  Exercise Prescription Changes - 06/22/22 1500       Response to Exercise   Blood Pressure (Admit) 136/70    Blood Pressure (Exit) 134/72    Heart Rate (Admit) 71 bpm    Heart Rate (Exercise) 118 bpm    Heart Rate (Exit) 81 bpm    Rating of Perceived Exertion (Exercise) 13    Symptoms knee pain 8/10    Duration Continue with 30 min of aerobic exercise without signs/symptoms of physical distress.    Intensity THRR unchanged      Progression   Progression Continue to progress workloads to maintain intensity without signs/symptoms of  physical distress.    Average METs 2.18      Resistance Training   Training Prescription Yes    Weight 3 lb    Reps 10-15      Interval Training   Interval Training No      NuStep   Level 4    Minutes 30    METs 15      REL-XR   Level 2    Minutes 15    METs 2      Track   Laps 30    Minutes 15    METs 2.63      Oxygen   Maintain Oxygen Saturation 88% or higher             Functional Capacity:  6 Minute Walk     Row Name 02/08/22 1612         6 Minute Walk   Phase Initial     Distance  665 feet     Walk Time 5.5 minutes     # of Rest Breaks 1     MPH 1.38     METS 1.65     RPE 12     Perceived Dyspnea  1     VO2 Peak 5.77     Symptoms Yes (comment)     Comments Chest pain 7/10 during walk. Relieved with rest and then resumed walk with no chest pain.     Resting HR 84 bpm     Resting BP 110/60     Resting Oxygen Saturation  97 %     Exercise Oxygen Saturation  during 6 min walk 97 %     Max Ex. HR 108 bpm     Max Ex. BP 138/60     2 Minute Post BP 118/78              Nutrition & Weight - Outcomes:  Pre Biometrics - 02/08/22 1627       Pre Biometrics   Height 5' 5.75" (1.67 m)    Weight 234 lb 1.6 oz (106.2 kg)    BMI (Calculated) 38.07    Single Leg Stand 6.6 seconds

## 2022-07-26 NOTE — Progress Notes (Signed)
Cardiac Individual Treatment Plan  Patient Details  Name: Felicia Woods MRN: [id number] Date of Birth: 01-09-1956 Referring Provider:   Flowsheet Row Cardiac Rehab from 02/08/2022 in [facility] Cardiac and Pulmonary Rehab  Referring Provider Corky Sox       Initial Encounter Date:  Flowsheet Row Cardiac Rehab from 02/08/2022 in [facility] Cardiac and Pulmonary Rehab  Date 02/08/22       Visit Diagnosis: No diagnosis found.  Patient's Home Medications on Admission:  Current Outpatient Medications:    amLODipine (NORVASC) 10 MG tablet, Take 1 tablet by mouth daily., Disp: , Rfl:    aspirin EC 81 MG tablet, Take 81 mg by mouth daily., Disp: , Rfl:    atorvastatin (LIPITOR) 40 MG tablet, Take 1 tablet (40 mg total) by mouth daily., Disp: 90 tablet, Rfl: 3   azithromycin (ZITHROMAX) 250 MG tablet, Take one tab a day for 10 days for uri, Disp: 10 tablet, Rfl: 0   Blood Glucose Monitoring Suppl (ONETOUCH VERIO FLEX SYSTEM) w/Device KIT, Use as directed twice a daily DX E11.65, Disp: 1 kit, Rfl: 0   chlorpheniramine-HYDROcodone (TUSSIONEX) 10-8 MG/5ML, Take 5 mLs by mouth every 12 (twelve) hours as needed for cough., Disp: 70 mL, Rfl: 0   clopidogrel (PLAVIX) 75 MG tablet, Take 1 tablet by mouth daily., Disp: , Rfl:    cyclobenzaprine (FLEXERIL) 10 MG tablet, Take 1 tablet by mouth at bedtime., Disp: , Rfl:    Dulaglutide (TRULICITY) 3 PY/0.9XI SOPN, Inject 3 mg as directed once a week., Disp: 2 mL, Rfl: 2   empagliflozin (JARDIANCE) 25 MG TABS tablet, Take one tab a day for diabetes, Disp: 90 tablet, Rfl: 3   famotidine (PEPCID) 20 MG tablet, Take 1 tablet by mouth 2 (two) times daily., Disp: , Rfl:    Fluticasone-Umeclidin-Vilant (TRELEGY ELLIPTA) 100-62.5-25 MCG/ACT AEPB, INHALE 1 PUFF INTO THE LUNGS ONCE DAILY, Disp: , Rfl:    furosemide (LASIX) 40 MG tablet, Take 1 tablet (40 mg total) by mouth 2 (two) times daily., Disp: 180 tablet, Rfl: 1   glucose blood (ONETOUCH VERIO) test strip, Use as  instructed twice daily DX E11.65, Disp: 100 each, Rfl: 3   isosorbide mononitrate (IMDUR) 60 MG 24 hr tablet, Take one tab po qhs for heart (Patient taking differently: 120 mg daily. Take one tab po qhs for heart), Disp: 90 tablet, Rfl: 1   losartan (COZAAR) 100 MG tablet, Take 1 tablet by mouth daily., Disp: , Rfl:    metoprolol succinate (TOPROL-XL) 25 MG 24 hr tablet, Take 1 tablet (25 mg total) by mouth daily., Disp: 90 tablet, Rfl: 1   Misc. Devices (ADJUST BATH/SHOWER SEAT) MISC, 1 shower seat for use at home to reduce risk of falling., Disp: 1 each, Rfl: 0   montelukast (SINGULAIR) 10 MG tablet, Take 1 tablet by mouth at bedtime., Disp: , Rfl:    nitroGLYCERIN (NITROSTAT) 0.4 MG SL tablet, DISSOLVE ONE TABLET UNDER THE TONGUE EVERY 5 MINUTES AS NEEDED FOR CHEST PAIN.  DO NOT EXCEED A TOTAL OF 3 DOSES IN 15 MINUTES, Disp: 30 tablet, Rfl: 3   OneTouch Delica Lancets 33A MISC, Use as directed twice a daily DX E11.65, Disp: 100 each, Rfl: 1   oxybutynin (DITROPAN) 5 MG tablet, Take 1 tablet by mouth daily., Disp: , Rfl:    PROAIR RESPICLICK 250 (90 Base) MCG/ACT AEPB, Inhale 2 puffs into the lungs every 6 (six) hours as needed., Disp: 3 each, Rfl: 1   ranolazine (RANEXA) 1000 MG SR tablet, Take  1 tablet (1,000 mg total) by mouth 2 (two) times daily., Disp: 60 tablet, Rfl: 1   zolpidem (AMBIEN) 5 MG tablet, TAKE 1 TABLET BY MOUTH AT BEDTIME AS NEEDED FOR SLEEP, Disp: 30 tablet, Rfl: 0  Past Medical History: Past Medical History:  Diagnosis Date   Asthma    Coronary artery disease    Diabetes mellitus without complication ([place])    Heart attack ([place])    Hyperlipidemia    Hypertension    MI (myocardial infarction) ([place])    Migraine headache with aura    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    Tobacco Use: Social History   Tobacco Use  Smoking Status Never  Smokeless Tobacco Never    Labs: Review Flowsheet  More data exists      Latest Ref Rng & Units 09/03/2021 09/11/2021 12/03/2021  04/20/2022 07/22/2022  Labs for ITP Cardiac and Pulmonary Rehab  Cholestrol 0 - 200 mg/dL - 243  - - -  LDL (calc) 0 - 99 mg/dL - 181  - - -  HDL-C >40 mg/dL - 50  - - -  Trlycerides <150 mg/dL - 62  - - -  Hemoglobin A1c 4.0 - 5.6 % 7.1  - 7.2  8.3  7.9      Exercise Target Goals: Exercise Program Goal: Individual exercise prescription set using results from initial 6 min walk test and THRR while considering  patient's activity barriers and safety.   Exercise Prescription Goal: Initial exercise prescription builds to 30-45 minutes a day of aerobic activity, 2-3 days per week.  Home exercise guidelines will be given to patient during program as part of exercise prescription that the participant will acknowledge.   Education: Aerobic Exercise: - Group verbal and visual presentation on the components of exercise prescription. Introduces F.I.T.T principle from ACSM for exercise prescriptions.  Reviews F.I.T.T. principles of aerobic exercise including progression. Written material given at graduation. Flowsheet Row Cardiac Rehab from 06/10/2022 in [facility] Cardiac and Pulmonary Rehab  Education need identified 02/08/22       Education: Resistance Exercise: - Group verbal and visual presentation on the components of exercise prescription. Introduces F.I.T.T principle from ACSM for exercise prescriptions  Reviews F.I.T.T. principles of resistance exercise including progression. Written material given at graduation.    Education: Exercise & Equipment Safety: - Individual verbal instruction and demonstration of equipment use and safety with use of the equipment. Flowsheet Row Cardiac Rehab from 06/10/2022 in [facility] Cardiac and Pulmonary Rehab  Date 02/08/22  Educator [facility]  Instruction Review Code 1- Verbalizes Understanding       Education: Exercise Physiology & General Exercise Guidelines: - Group verbal and written instruction with models to review the exercise physiology of the  cardiovascular system and associated critical values. Provides general exercise guidelines with specific guidelines to those with heart or lung disease.    Education: Flexibility, Balance, Mind/Body Relaxation: - Group verbal and visual presentation with interactive activity on the components of exercise prescription. Introduces F.I.T.T principle from ACSM for exercise prescriptions. Reviews F.I.T.T. principles of flexibility and balance exercise training including progression. Also discusses the mind body connection.  Reviews various relaxation techniques to help reduce and manage stress (i.e. Deep breathing, progressive muscle relaxation, and visualization). Balance handout provided to take home. Written material given at graduation.   Activity Barriers & Risk Stratification:   6 Minute Walk:  6 Minute Walk     Row Name 02/08/22 1612         6  Minute Walk   Phase Initial     Distance 665 feet     Walk Time 5.5 minutes     # of Rest Breaks 1     MPH 1.38     METS 1.65     RPE 12     Perceived Dyspnea  1     VO2 Peak 5.77     Symptoms Yes (comment)     Comments Chest pain 7/10 during walk. Relieved with rest and then resumed walk with no chest pain.     Resting HR 84 bpm     Resting BP 110/60     Resting Oxygen Saturation  97 %     Exercise Oxygen Saturation  during 6 min walk 97 %     Max Ex. HR 108 bpm     Max Ex. BP 138/60     2 Minute Post BP 118/78              Oxygen Initial Assessment:   Oxygen Re-Evaluation:   Oxygen Discharge (Final Oxygen Re-Evaluation):   Initial Exercise Prescription:  Initial Exercise Prescription - 02/08/22 1600       Date of Initial Exercise RX and Referring Provider   Date 02/08/22    Referring Provider Corky Sox      Oxygen   Maintain Oxygen Saturation 88% or higher      NuStep   Level 1    SPM 80    Minutes 15    METs 1.65      Arm Ergometer   Level 1    RPM 30    Minutes 15    METs 1.65      REL-XR   Level 1     Speed 50    Minutes 15    METs 1.65      Biostep-RELP   Level 1    SPM 50    Minutes 15    METs 1.65      Track   Laps 7    Minutes 15    METs 1.38      Intensity   THRR 40-80% of Max Heartrate 112-141    Ratings of Perceived Exertion 11-13    Perceived Dyspnea 0-4      Progression   Progression Continue to progress workloads to maintain intensity without signs/symptoms of physical distress.      Resistance Training   Training Prescription Yes    Weight 3    Reps 10-15             Perform Capillary Blood Glucose checks as needed.  Exercise Prescription Changes:   Exercise Prescription Changes     Row Name 02/08/22 1600 03/02/22 1500 03/18/22 1700 03/29/22 1400 05/12/22 1600     Response to Exercise   Blood Pressure (Admit) 110/60 124/82 122/56 128/72 122/62   Blood Pressure (Exercise) 138/60 134/70 -- -- 116/68   Blood Pressure (Exit) 118/78 120/80 122/64 104/72 106/68   Heart Rate (Admit) 84 bpm 83 bpm 98 bpm 99 bpm 90 bpm   Heart Rate (Exercise) 108 bpm 131 bpm 132 bpm 127 bpm 120 bpm   Heart Rate (Exit) 85 bpm 86 bpm 95 bpm 97 bpm 86 bpm   Oxygen Saturation (Admit) 97 % -- -- -- --   Oxygen Saturation (Exercise) 97 % -- -- -- --   Oxygen Saturation (Exit) 97 % -- -- -- --   Rating of Perceived Exertion (Exercise) '12 15 11 13 13   '$ Perceived  Dyspnea (Exercise) 1 -- -- -- --   Symptoms Chest pain 7/10 relieved with rest. Resumed walk test and finished with no more pain. knee pain, fatigue none none none   Comments 6MWT results 2nd full day of exercise -- -- --   Duration -- Progress to 30 minutes of  aerobic without signs/symptoms of physical distress Continue with 30 min of aerobic exercise without signs/symptoms of physical distress. Continue with 30 min of aerobic exercise without signs/symptoms of physical distress. Continue with 30 min of aerobic exercise without signs/symptoms of physical distress.   Intensity -- THRR unchanged THRR unchanged THRR  unchanged THRR unchanged     Progression   Progression -- Continue to progress workloads to maintain intensity without signs/symptoms of physical distress. Continue to progress workloads to maintain intensity without signs/symptoms of physical distress. Continue to progress workloads to maintain intensity without signs/symptoms of physical distress. Continue to progress workloads to maintain intensity without signs/symptoms of physical distress.   Average METs -- 2.19 2.81 2.71 1.73     Resistance Training   Training Prescription -- Yes Yes Yes Yes   Weight -- 3 lb 3 lb 3 lb 3 lb   Reps -- 10-15 10-15 10-15 10-15     Interval Training   Interval Training -- No No No No     Arm Ergometer   Level -- '1 1 1 1   '$ Minutes -- '15 15 15 15   '$ METs -- 2.4 2.3 2.5 1.8     REL-XR   Level -- -- 3 2 --   Minutes -- -- 15 15 --   METs -- -- -- 4 --     Track   Laps -- '20 21 12 12   '$ Minutes -- '15 15 15 15   '$ METs -- 2.09 2.14 1.65 1.65     Oxygen   Maintain Oxygen Saturation -- -- 88% or higher 88% or higher 88% or higher    Row Name 05/25/22 1500 06/22/22 1500           Response to Exercise   Blood Pressure (Admit) 122/70 136/70      Blood Pressure (Exercise) 128/64 --      Blood Pressure (Exit) 112/70 134/72      Heart Rate (Admit) 92 bpm 71 bpm      Heart Rate (Exercise) 134 bpm 118 bpm      Heart Rate (Exit) 92 bpm 81 bpm      Rating of Perceived Exertion (Exercise) 13 13      Symptoms none knee pain 8/10      Duration Continue with 30 min of aerobic exercise without signs/symptoms of physical distress. Continue with 30 min of aerobic exercise without signs/symptoms of physical distress.      Intensity THRR unchanged THRR unchanged        Progression   Progression Continue to progress workloads to maintain intensity without signs/symptoms of physical distress. Continue to progress workloads to maintain intensity without signs/symptoms of physical distress.      Average METs 1.8  2.18        Resistance Training   Training Prescription Yes Yes      Weight 3 lb 3 lb      Reps 10-15 10-15        Interval Training   Interval Training No No        NuStep   Level 2 4      Minutes 15 30  METs -- 15        REL-XR   Level 2 2      Minutes 15 15      METs -- 2        Track   Laps 22 30      Minutes 15 15      METs 2.2 2.63        Oxygen   Maintain Oxygen Saturation 88% or higher 88% or higher               Exercise Comments:   Exercise Comments     Row Name 02/18/22 1001 03/11/22 1036         Exercise Comments First full day of exercise!  Patient was oriented to gym and equipment including functions, settings, policies, and procedures.  Patient's individual exercise prescription and treatment plan were reviewed.  All starting workloads were established based on the results of the 6 minute walk test done at initial orientation visit.  The plan for exercise progression was also introduced and progression will be customized based on patient's performance and goals. Felicia Woods is using nitro sl at least 2 x a day for chest pain with exertion. Advised her to notify MD of this. Also gave her another med name  to review with her physician for angina control.               Exercise Goals and Review:   Exercise Goals     Row Name 02/08/22 1624             Exercise Goals   Increase Physical Activity Yes       Intervention Provide advice, education, support and counseling about physical activity/exercise needs.;Develop an individualized exercise prescription for aerobic and resistive training based on initial evaluation findings, risk stratification, comorbidities and participant's personal goals.       Expected Outcomes Short Term: Attend rehab on a regular basis to increase amount of physical activity.;Long Term: Add in home exercise to make exercise part of routine and to increase amount of physical activity.;Long Term: Exercising regularly at  least 3-5 days a week.       Increase Strength and Stamina Yes       Intervention Provide advice, education, support and counseling about physical activity/exercise needs.;Develop an individualized exercise prescription for aerobic and resistive training based on initial evaluation findings, risk stratification, comorbidities and participant's personal goals.       Expected Outcomes Short Term: Increase workloads from initial exercise prescription for resistance, speed, and METs.;Short Term: Perform resistance training exercises routinely during rehab and add in resistance training at home;Long Term: Improve cardiorespiratory fitness, muscular endurance and strength as measured by increased METs and functional capacity (6MWT)       Able to understand and use rate of perceived exertion (RPE) scale Yes       Intervention Provide education and explanation on how to use RPE scale       Expected Outcomes Short Term: Able to use RPE daily in rehab to express subjective intensity level;Long Term:  Able to use RPE to guide intensity level when exercising independently       Able to understand and use Dyspnea scale Yes       Intervention Provide education and explanation on how to use Dyspnea scale       Expected Outcomes Short Term: Able to use Dyspnea scale daily in rehab to express subjective sense of shortness of breath during exertion;Long Term: Able to  use Dyspnea scale to guide intensity level when exercising independently       Knowledge and understanding of Target Heart Rate Range (THRR) Yes       Intervention Provide education and explanation of THRR including how the numbers were predicted and where they are located for reference       Expected Outcomes Short Term: Able to state/look up THRR;Short Term: Able to use daily as guideline for intensity in rehab;Long Term: Able to use THRR to govern intensity when exercising independently       Able to check pulse independently Yes       Intervention  Provide education and demonstration on how to check pulse in carotid and radial arteries.;Review the importance of being able to check your own pulse for safety during independent exercise       Expected Outcomes Short Term: Able to explain why pulse checking is important during independent exercise;Long Term: Able to check pulse independently and accurately       Understanding of Exercise Prescription Yes       Intervention Provide education, explanation, and written materials on patient's individual exercise prescription       Expected Outcomes Short Term: Able to explain program exercise prescription;Long Term: Able to explain home exercise prescription to exercise independently                Exercise Goals Re-Evaluation :  Exercise Goals Re-Evaluation     Row Name 02/18/22 1002 03/02/22 1521 03/18/22 1749 03/29/22 1404 04/13/22 1513     Exercise Goal Re-Evaluation   Exercise Goals Review Able to understand and use rate of perceived exertion (RPE) scale;Knowledge and understanding of Target Heart Rate Range (THRR);Able to understand and use Dyspnea scale;Able to check pulse independently;Understanding of Exercise Prescription Increase Physical Activity;Increase Strength and Stamina;Understanding of Exercise Prescription Increase Physical Activity;Increase Strength and Stamina;Understanding of Exercise Prescription Increase Physical Activity;Increase Strength and Stamina;Understanding of Exercise Prescription Increase Physical Activity;Increase Strength and Stamina;Understanding of Exercise Prescription   Comments Reviewed RPE and dyspnea scales, THR and program prescription with pt today.  Pt voiced understanding and was given a copy of goals to take home. Felicia Woods is off to a good start in rehab. She has only worked on the arm ergometer and track so far, however, was able to complete full 20 laps on the track! Her HR reached her THR. We will continue to monitor her progress as she progresses in  the program. Felicia Woods is doing well in rehab. She recently improved her average overall MET level to 2.81 METs. She also improved to level 3 on the XR and got up to 21 laps on the track. We will continue to monitor her progress. Felicia Woods is doing well in rehab. She has done as many as 21 laps, but is now back down to 12 laps.  She is on level 2 for the XR.  We will talk to her about getting consistent with workloads to see improvement. Felicia Woods had another MI and was in the hospital from 10/03-10/05. We will reach out to her and see if she wants to get clearance from her doctor or get a new referral.   Expected Outcomes Short: Use RPE daily to regulate intensity. Long: Follow program prescription in THR. Short: Continue to work up laps on the track Long: Work on increasing overall MET level Short: Continue to increase workloads and laps on the track. Long: Continue to increase strength and stamina. Short: Attend rehab consistently and keep workloads consistent  Long: Cointinue to improve stamina Short: Return to rehab. Long: Continue to increase strength and stamina.    [place] Name 04/26/22 1056 05/04/22 1000 05/12/22 1625 05/18/22 1054 05/25/22 1505     Exercise Goal Re-Evaluation   Exercise Goals Review Increase Physical Activity;Increase Strength and Stamina;Understanding of Exercise Prescription Increase Physical Activity;Increase Strength and Stamina;Understanding of Exercise Prescription Increase Physical Activity;Increase Strength and Stamina;Understanding of Exercise Prescription Increase Physical Activity;Increase Strength and Stamina;Understanding of Exercise Prescription Increase Physical Activity;Increase Strength and Stamina;Understanding of Exercise Prescription   Comments Staff spoke with Felicia Woods to let her know she was cleared to come back to the program, per her Pulmonologist follow up appt. Patient plans to resume this week. Pansie returned today.  She has been out since being hospitalized with  another heart event. She has not been exercising since discharge as she has been working more. Felicia Woods returned to rehab after being hospitalized with another heart event. She has only been to one session since returning. During that session she was able to walk 12 laps on the track and tolerated 3 lb hand weights for resistance training. We will continue to monitor her progress in the program. Felicia Woods is doing well in rehab.  She is walking and using her pedal machine and doing weights at home.  She is feeling like her stamina is getting better.  She knows that she feels better on days that she exercises versus when she does not come. Felicia Woods continues to do well in rehab. She is back up to walking 22 laps on the track! We hope to see that continue going up. She has been consistently at level 2 on the T4 Nustep and would benefit from going to level 3. She is limited by knee pain. She hits her THR most sessions. Her attendance is sporadic and expect more consistency. Will continue to monitor.   Expected Outcomes Short: Return to rehab and ease into it Long: Build up METS and overall strength Short; Return to regular rehab attendance Long; Return to routine exercise daily Short: Return to regular rehab attendance Long: Continue to increase strength and stamina. Short: Continue to exercise on his off days Long: Conitnue to improve stamina Short: Continue to increase laps on track, next time up to 25 Long: Continue to increase overall MET level    Row Name 06/10/22 1524 06/15/22 0937 06/22/22 1522 07/20/22 1530       Exercise Goal Re-Evaluation   Exercise Goals Review Increase Physical Activity;Increase Strength and Stamina;Understanding of Exercise Prescription Increase Physical Activity;Increase Strength and Stamina;Understanding of Exercise Prescription Increase Physical Activity;Increase Strength and Stamina;Understanding of Exercise Prescription Increase Physical Activity;Increase Strength and  Stamina;Understanding of Exercise Prescription    Comments Felicia Woods returned to rehab on 06/08/2022 after being out since 05/18/2022. She has done well since returning to rehab. We will continue to monitor her progress in the program. Felicia Woods is doing well in rehab.  She is scheduled for another stent this week as her chest pain has been limiting her activity.  She is walking some on her off days in the house.  She is hoping to feel like a new person after her stent this week.  She is excited about it! Felicia Woods was out last week as she had a heart cath, but ended up not doing any PCI or intervention. Patient is still experiencing some intermittent chest pain and she is supposed to see her doctor on 12/20 to discuss her next steps. Here at rehab, she was able to still walk  30 laps around the track which is the most she has done! We hope to see her continue to improve and will talk with patient on next steps pending what her MD says Wednesday (continues sessions vs. D/c). Will monitor until then. Felicia Woods has not been here since 12/12 as she was out for a heart cath and then experienced work issues. We will continue to update with patient on her return date  back.    Expected Outcomes Short: Continue to attend rehab regularly, and return to exercise. Long: Continue to increase strength and stamina. -- Short: Talk to doctor on 12/20, resume rehab back when cleared and ready Long: Graduate from Heart Track program Short: Return to rehab and maintain good attendance Long: Continue to build up overall strength and stamina in program             Discharge Exercise Prescription (Final Exercise Prescription Changes):  Exercise Prescription Changes - 06/22/22 1500       Response to Exercise   Blood Pressure (Admit) 136/70    Blood Pressure (Exit) 134/72    Heart Rate (Admit) 71 bpm    Heart Rate (Exercise) 118 bpm    Heart Rate (Exit) 81 bpm    Rating of Perceived Exertion (Exercise) 13    Symptoms knee pain  8/10    Duration Continue with 30 min of aerobic exercise without signs/symptoms of physical distress.    Intensity THRR unchanged      Progression   Progression Continue to progress workloads to maintain intensity without signs/symptoms of physical distress.    Average METs 2.18      Resistance Training   Training Prescription Yes    Weight 3 lb    Reps 10-15      Interval Training   Interval Training No      NuStep   Level 4    Minutes 30    METs 15      REL-XR   Level 2    Minutes 15    METs 2      Track   Laps 30    Minutes 15    METs 2.63      Oxygen   Maintain Oxygen Saturation 88% or higher             Nutrition:  Target Goals: Understanding of nutrition guidelines, daily intake of sodium '1500mg'$ , cholesterol '200mg'$ , calories 30% from fat and 7% or less from saturated fats, daily to have 5 or more servings of fruits and vegetables.  Education: All About Nutrition: -Group instruction provided by verbal, written material, interactive activities, discussions, models, and posters to present general guidelines for heart healthy nutrition including fat, fiber, MyPlate, the role of sodium in heart healthy nutrition, utilization of the nutrition label, and utilization of this knowledge for meal planning. Follow up email sent as well. Written material given at graduation. Flowsheet Row Cardiac Rehab from 06/10/2022 in [facility] Cardiac and Pulmonary Rehab  Education need identified 02/08/22       Biometrics:  Pre Biometrics - 02/08/22 1627       Pre Biometrics   Height 5' 5.75" (1.67 m)    Weight 234 lb 1.6 oz (106.2 kg)    BMI (Calculated) 38.07    Single Leg Stand 6.6 seconds              Nutrition Therapy Plan and Nutrition Goals:  Nutrition Therapy & Goals - 06/15/22 0801       Nutrition Therapy  Diet Heart healthy, low Na, T2DM    Drug/Food Interactions Statins/Certain Fruits    Protein (specify units) 90-100g    Fiber 25 grams    Whole Grain  Foods 3 servings    Saturated Fats 12 max. grams    Fruits and Vegetables 8 servings/day    Sodium 1.5 grams      Personal Nutrition Goals   Nutrition Goal ST: limit eating out <1-2x/week, eat consistently throughout the day including more whole/minimally processed snacks like fruit with peanut butter, practice MyPlate guidelines LT: follow MyPlate guidelines, maintain A1C <7, limit sodium <1.5g/day, include at least 25g of fiber/day    Comments 67 y.o. F admitted to cardiac rehab s/p coronary artery stent placement. PMHx includes asthma, COPD, HTN, HLD, T2DM. Relevant medications includes trulicity, jardiance, famotidine, trelegy, furosemide, ambien. B: does not eat due to work L: grilled chicken sandwich (zaxbys) with fruit  and water with flavoring D: last night: BBQ pork chops with macaroni and cheese with a lot of spinach (she loves green leafy vegetables) and small juice. S: rolled oats 15 minutes before bedtime. Drinks: water, 1 pepsi (regular). She reports that usually she does not feel hungry and that she is "too tired to be hungry". BG has been 120s-140s: morning, after lunch, and dinner. Her A1C is around 7. Felicia Woods reports going out to eat for dinner at least 2-3x/week. Discussed how when going out to eat, sodium is likely high no matter what you end up getting and suggested eating out less than 1-2x/week as she already eats out for lunch. Suggested including morning snack such as a boiled egg or peanut butter with fruit. Encouraged her to eat her rolled oats at least an hour before bed instead of 15 minutes before bed. Encouarged a variety of non-starchy vegetables and importance fiber-rich foods; she reports not enjoying most whole grains aside from oatmeal. Reviewed heart healthy eating and diabetes friendly eating.      Intervention Plan   Intervention Prescribe, educate and counsel regarding individualized specific dietary modifications aiming towards targeted core components such as  weight, hypertension, lipid management, diabetes, heart failure and other comorbidities.    Expected Outcomes Short Term Goal: Understand basic principles of dietary content, such as calories, fat, sodium, cholesterol and nutrients.;Short Term Goal: A plan has been developed with personal nutrition goals set during dietitian appointment.;Long Term Goal: Adherence to prescribed nutrition plan.             Nutrition Assessments:  MEDIFICTS Score Key: ?70 Need to make dietary changes  40-70 Heart Healthy Diet ? 40 Therapeutic Level Cholesterol Diet  Flowsheet Row Cardiac Rehab from 02/08/2022 in [facility] Cardiac and Pulmonary Rehab  Picture Your Plate Total Score on Admission 41      Picture Your Plate Scores: <81 Unhealthy dietary pattern with much room for improvement. 41-50 Dietary pattern unlikely to meet recommendations for good health and room for improvement. 51-60 More healthful dietary pattern, with some room for improvement.  >60 Healthy dietary pattern, although there may be some specific behaviors that could be improved.    Nutrition Goals Re-Evaluation:  Nutrition Goals Re-Evaluation     Row Name 05/05/22 0656 05/18/22 1109           Goals   Nutrition Goal Set up appt with dietitian Set up appt with dietitian      Comment Felicia Woods has been out for over a month and has not had an appointment with Felicia Woods yet. Has appointment on 11/6  Expected Outcome Set up appointment with dietitian Meet with dietitian               Nutrition Goals Discharge (Final Nutrition Goals Re-Evaluation):  Nutrition Goals Re-Evaluation - 05/18/22 1109       Goals   Nutrition Goal Set up appt with dietitian    Comment Has appointment on 11/6    Expected Outcome Meet with dietitian             Psychosocial: Target Goals: Acknowledge presence or absence of significant depression and/or stress, maximize coping skills, provide positive support system. Participant is able to  verbalize types and ability to use techniques and skills needed for reducing stress and depression.   Education: Stress, Anxiety, and Depression - Group verbal and visual presentation to define topics covered.  Reviews how body is impacted by stress, anxiety, and depression.  Also discusses healthy ways to reduce stress and to treat/manage anxiety and depression.  Written material given at graduation. Flowsheet Row Cardiac Rehab from 06/10/2022 in [facility] Cardiac and Pulmonary Rehab  Education need identified 02/08/22       Education: Sleep Hygiene -Provides group verbal and written instruction about how sleep can affect your health.  Define sleep hygiene, discuss sleep cycles and impact of sleep habits. Review good sleep hygiene tips.    Initial Review & Psychosocial Screening:   Quality of Life Scores:   Quality of Life - 02/08/22 1630       Quality of Life   Select Quality of Life      Quality of Life Scores   Health/Function Pre 23.93 %    Socioeconomic Pre 28.17 %    Psych/Spiritual Pre 30 %    Family Pre 30 %    GLOBAL Pre 27 %            Scores of 19 and below usually indicate a poorer quality of life in these areas.  A difference of  2-3 points is a clinically meaningful difference.  A difference of 2-3 points in the total score of the Quality of Life Index has been associated with significant improvement in overall quality of life, self-image, physical symptoms, and general health in studies assessing change in quality of life.  PHQ-9: Review Flowsheet  More data exists      07/22/2022 04/20/2022 02/08/2022 12/03/2021 09/03/2021  Depression screen PHQ 2/9  Decreased Interest 0 0 1 0 0  Down, Depressed, Hopeless 0 0 0 0 0  PHQ - 2 Score 0 0 1 0 0  Altered sleeping - - 0 - -  Tired, decreased energy - - 0 - -  Change in appetite - - 0 - -  Feeling bad or failure about yourself  - - 0 - -  Trouble concentrating - - 0 - -  Moving slowly or fidgety/restless - - 0 - -   Suicidal thoughts - - 0 - -  PHQ-9 Score - - 1 - -  Difficult doing work/chores - - Not difficult at all - -   Interpretation of Total Score  Total Score Depression Severity:  1-4 = Minimal depression, 5-9 = Mild depression, 10-14 = Moderate depression, 15-19 = Moderately severe depression, 20-27 = Severe depression   Psychosocial Evaluation and Intervention:   Psychosocial Re-Evaluation:  Psychosocial Re-Evaluation     Row Name 05/04/22 1000 05/18/22 1056 06/15/22 0939         Psychosocial Re-Evaluation   Current issues with Current Stress Concerns Current Stress Concerns Current  Stress Concerns     Comments Winda returned today after last heart event has had her out for over a month.  She tries not to let too much get to her, but they are short staffed at work.  This makes her feel over worked and unable to do much else. Daisa is doing well in rehab.  She is off to a good restart.  Her health continues to be her biggest stressor.  She is learning to work with her work stress.  She is sleeping better than ever.  She enjoyes coming to class. Evelyne is doing well in rehab.  Her chest pain has been her biggest stressor.  She is scheduled for a new stent this week.  She is sleeping well with her amiben and has not had any problems.  She is hoping to feel great after her new stent     Expected Outcomes Short; Return to routine exercise for mental boost Long: continue to cope with work Short: Continue to exercise for mental health Long; continue to stay positive Short: Get stent and stay positive Long: Continue to exercise for mental boost     Interventions Encouraged to attend Cardiac Rehabilitation for the exercise;Stress management education Encouraged to attend Cardiac Rehabilitation for the exercise;Stress management education Encouraged to attend Cardiac Rehabilitation for the exercise;Stress management education     Continue Psychosocial Services  Follow up required by staff Follow up  required by staff Follow up required by staff              Psychosocial Discharge (Final Psychosocial Re-Evaluation):  Psychosocial Re-Evaluation - 06/15/22 0939       Psychosocial Re-Evaluation   Current issues with Current Stress Concerns    Comments Fara is doing well in rehab.  Her chest pain has been her biggest stressor.  She is scheduled for a new stent this week.  She is sleeping well with her amiben and has not had any problems.  She is hoping to feel great after her new stent    Expected Outcomes Short: Get stent and stay positive Long: Continue to exercise for mental boost    Interventions Encouraged to attend Cardiac Rehabilitation for the exercise;Stress management education    Continue Psychosocial Services  Follow up required by staff             Vocational Rehabilitation: Provide vocational rehab assistance to qualifying candidates.   Vocational Rehab Evaluation & Intervention:   Education: Education Goals: Education classes will be provided on a variety of topics geared toward better understanding of heart health and risk factor modification. Participant will state understanding/return demonstration of topics presented as noted by education test scores.  Learning Barriers/Preferences:   General Cardiac Education Topics:  AED/CPR: - Group verbal and written instruction with the use of models to demonstrate the basic use of the AED with the basic ABC's of resuscitation.   Anatomy and Cardiac Procedures: - Group verbal and visual presentation and models provide information about basic cardiac anatomy and function. Reviews the testing methods done to diagnose heart disease and the outcomes of the test results. Describes the treatment choices: Medical Management, Angioplasty, or Coronary Bypass Surgery for treating various heart conditions including Myocardial Infarction, Angina, Valve Disease, and Cardiac Arrhythmias.  Written material given at  graduation. Flowsheet Row Cardiac Rehab from 06/10/2022 in [facility] Cardiac and Pulmonary Rehab  Education need identified 02/08/22  Date 06/10/22  Educator sb  Instruction Review Code 1- Verbalizes Understanding  Medication Safety: - Group verbal and visual instruction to review commonly prescribed medications for heart and lung disease. Reviews the medication, class of the drug, and side effects. Includes the steps to properly store meds and maintain the prescription regimen.  Written material given at graduation.   Intimacy: - Group verbal instruction through game format to discuss how heart and lung disease can affect sexual intimacy. Written material given at graduation..   Know Your Numbers and Heart Failure: - Group verbal and visual instruction to discuss disease risk factors for cardiac and pulmonary disease and treatment options.  Reviews associated critical values for Overweight/Obesity, Hypertension, Cholesterol, and Diabetes.  Discusses basics of heart failure: signs/symptoms and treatments.  Introduces Heart Failure Zone chart for action plan for heart failure.  Written material given at graduation.   Infection Prevention: - Provides verbal and written material to individual with discussion of infection control including proper hand washing and proper equipment cleaning during exercise session. Flowsheet Row Cardiac Rehab from 06/10/2022 in [facility] Cardiac and Pulmonary Rehab  Date 02/08/22  Educator [facility]  Instruction Review Code 1- Verbalizes Understanding       Falls Prevention: - Provides verbal and written material to individual with discussion of falls prevention and safety. Flowsheet Row Cardiac Rehab from 06/10/2022 in [facility] Cardiac and Pulmonary Rehab  Date 02/08/22  Educator [facility]  Instruction Review Code 1- Verbalizes Understanding       Other: -Provides group and verbal instruction on various topics (see comments)   Knowledge Questionnaire Score:   Knowledge Questionnaire Score - 02/08/22 1631       Knowledge Questionnaire Score   Pre Score 19/26             Core Components/Risk Factors/Patient Goals at Admission:  Personal Goals and Risk Factors at Admission - 02/08/22 1628       Core Components/Risk Factors/Patient Goals on Admission    Weight Management Yes    Intervention Weight Management: Develop a combined nutrition and exercise program designed to reach desired caloric intake, while maintaining appropriate intake of nutrient and fiber, sodium and fats, and appropriate energy expenditure required for the weight goal.;Weight Management: Provide education and appropriate resources to help participant work on and attain dietary goals.;Weight Management/Obesity: Establish reasonable short term and long term weight goals.;Obesity: Provide education and appropriate resources to help participant work on and attain dietary goals.    Admit Weight 234 lb 1.6 oz (106.2 kg)    Goal Weight: Short Term 228 lb (103.4 kg)    Goal Weight: Long Term 200 lb (90.7 kg)    Expected Outcomes Short Term: Continue to assess and modify interventions until short term weight is achieved;Long Term: Adherence to nutrition and physical activity/exercise program aimed toward attainment of established weight goal;Weight Loss: Understanding of general recommendations for a balanced deficit meal plan, which promotes 1-2 lb weight loss per week and includes a negative energy balance of [phone] kcal/d;Understanding recommendations for meals to include 15-35% energy as protein, 25-35% energy from fat, 35-60% energy from carbohydrates, less than '200mg'$  of dietary cholesterol, 20-35 gm of total fiber daily;Understanding of distribution of calorie intake throughout the day with the consumption of 4-5 meals/snacks    Diabetes Yes    Intervention Provide education about signs/symptoms and action to take for hypo/hyperglycemia.;Provide education about proper nutrition,  including hydration, and aerobic/resistive exercise prescription along with prescribed medications to achieve blood glucose in normal ranges: Fasting glucose 65-99 mg/dL    Expected Outcomes Short Term: Participant verbalizes understanding  of the signs/symptoms and immediate care of hyper/hypoglycemia, proper foot care and importance of medication, aerobic/resistive exercise and nutrition plan for blood glucose control.;Long Term: Attainment of HbA1C < 7%.    Hypertension Yes    Intervention Provide education on lifestyle modifcations including regular physical activity/exercise, weight management, moderate sodium restriction and increased consumption of fresh fruit, vegetables, and low fat dairy, alcohol moderation, and smoking cessation.;Monitor prescription use compliance.    Expected Outcomes Short Term: Continued assessment and intervention until BP is < 140/18m HG in hypertensive participants. < 130/855mHG in hypertensive participants with diabetes, heart failure or chronic kidney disease.;Long Term: Maintenance of blood pressure at goal levels.    Lipids Yes    Intervention Provide education and support for participant on nutrition & aerobic/resistive exercise along with prescribed medications to achieve LDL '70mg'$ , HDL >'40mg'$ .    Expected Outcomes Short Term: Participant states understanding of desired cholesterol values and is compliant with medications prescribed. Participant is following exercise prescription and nutrition guidelines.;Long Term: Cholesterol controlled with medications as prescribed, with individualized exercise RX and with personalized nutrition plan. Value goals: LDL < '70mg'$ , HDL > 40 mg.             Education:Diabetes - Individual verbal and written instruction to review signs/symptoms of diabetes, desired ranges of glucose level fasting, after meals and with exercise. Acknowledge that pre and post exercise glucose checks will be done for 3 sessions at entry of  program. [place] [facility]  Education need identified 02/08/22       Core Components/Risk Factors/Patient Goals Review:   Goals and Risk Factor Review     Row Name 05/04/22 1000 05/18/22 1109 06/15/22 0941         Core Components/Risk Factors/Patient Goals Review   Personal Goals Review Weight Management/Obesity;Hypertension;Diabetes Weight Management/Obesity;Hypertension;Diabetes Weight Management/Obesity;Hypertension;Diabetes     Review Felicia Woods returned today after being out for over a month with a another heart event.  Her weight is up some since she started, but she has not been exercising.  Her pressures have been good and sugars are stable.  She hopes that getting back to exercise again will help with weight loss Felicia Woods to work on weight loss.  She has been holding steady still, but not gaining.  She contineus to have better control over her sugars. Her pressures are doing well.  She feels good with her meds. Lanya is doing well in rehab.  She is still working on weight loss.  She is scheduled for another stent on Thursday.  Her sugars have been doing well and pressures too.     Expected Outcomes Short; Reutrn to steady attendance in rehab Long: Contineu to monitor risk factors Short: Contineu to work on weight loss Long: Conitnue to monitor sugars Short: Get new stent and get clearance to return Long: Conitnue monitor progress.              Core Components/Risk Factors/Patient Goals at Discharge (Final Review):   Goals and Risk Factor Review - 06/15/22 0941       Core Components/Risk Factors/Patient Goals Review   Personal Goals Review Weight Management/Obesity;Hypertension;Diabetes    Review ViOlayinkas doing well in rehab.  She is still working on weight loss.  She is scheduled for another stent on Thursday.  Her sugars have been doing well and pressures too.    Expected Outcomes Short: Get new stent and get  clearance to return Long: Conitnue  monitor progress.             ITP Comments:  ITP Comments     Row Name 02/08/22 1611 02/10/22 1006 02/18/22 1001 03/10/22 1415 03/11/22 1035   ITP Comments Completed 6MWT and gym orientation. Initial ITP created and sent for review to Dr. Emily Filbert, Medical Director. 30 Day review completed. Medical Director ITP review done, changes made as directed, and signed approval by Medical Director.   NEW First full day of exercise!  Patient was oriented to gym and equipment including functions, settings, policies, and procedures.  Patient's individual exercise prescription and treatment plan were reviewed.  All starting workloads were established based on the results of the 6 minute walk test done at initial orientation visit.  The plan for exercise progression was also introduced and progression will be customized based on patient's performance and goals. 30 Day review completed. Medical Director ITP review done, changes made as directed, and signed approval by Medical Director.   NEW Eva is using nitro sl at least 2 x a day for chest pain with exertion. Advised her to notify MD of this. Also gave her another med name  to review with her physician for angina control.    [place] Name 04/06/22 1050 04/07/22 0809 04/15/22 1513 04/26/22 1055 05/05/22 1039   ITP Comments Patient called to let us know she is getting admitted to the hospital for cardiac issues. Will place patient on medical hold from rehab and will need clearance for her to return. 30 Day review completed. Medical Director ITP review done, changes made as directed, and signed approval by Medical Director.   Out for medical reason Cheyeanne called to let us know that she does not have a follow up appt until 11/22 from her recent hospitalization for MI.  She would like to finish the program. Staff spoke with Tiernan to let her know she was cleared to come back to the program, per her Pulmonologist follow up appt.  Patient plans to resume this week. 30 Day review completed. Medical Director ITP review done, changes made as directed, and signed approval by Medical Director.    [place] Name 06/02/22 1103 06/15/22 0938 06/15/22 1000 06/21/22 1516 06/30/22 1129   ITP Comments 30 Day review completed. Medical Director ITP review done, changes made as directed, and signed approval by Medical Director. New stent placement scheduled for 12/14. Completed initial nutrition consultation Called patient, she had a cath completed 12/14, but states no PCI. She states she is supposed to see her cardiologist on Wed, 12/20 to discuss next steps. Patient to call us after she sees doctor on when she is cleared to return. 30 Day review completed. Medical Director ITP review done, changes made as directed, and signed approval by Medical Director.    [place] Name 07/20/22 1525 07/26/22 1342         ITP Comments Called to check in on patient.  She has been out since cath in December. Last visit was 06/15/22.  Left message. Staff spoke with pt last week who stated she has encountered family issues and does not know when she would be able to return to rehab as Catha Gosselin is currently out of town.. Pt requested disharge at this time.               Comments: Discharge ITP

## 2022-07-27 ENCOUNTER — Ambulatory Visit: Payer: Medicare Other

## 2022-07-27 ENCOUNTER — Encounter: Payer: Medicare Other | Admitting: Physical Therapy

## 2022-07-27 ENCOUNTER — Other Ambulatory Visit: Payer: Self-pay

## 2022-07-27 MED ORDER — FLUCONAZOLE 150 MG PO TABS: ORAL_TABLET | ORAL | 0 refills | Status: DC

## 2022-07-27 NOTE — Telephone Encounter (Signed)
Pt called that she need diflucan refills as per lauren send 3 tab to [facility]

## 2022-07-30 ENCOUNTER — Ambulatory Visit: Payer: Medicare Other

## 2022-08-01 IMAGING — CR DG RIBS W/ CHEST 3+V*R*
3 series · 3 of 3 positions shown · non-contrast
Comparison: 07/02/2019

CLINICAL DATA: Right rib pain.  No known injury

EXAM:
RIGHT RIBS AND CHEST - 3+ VIEW

[chest pa]
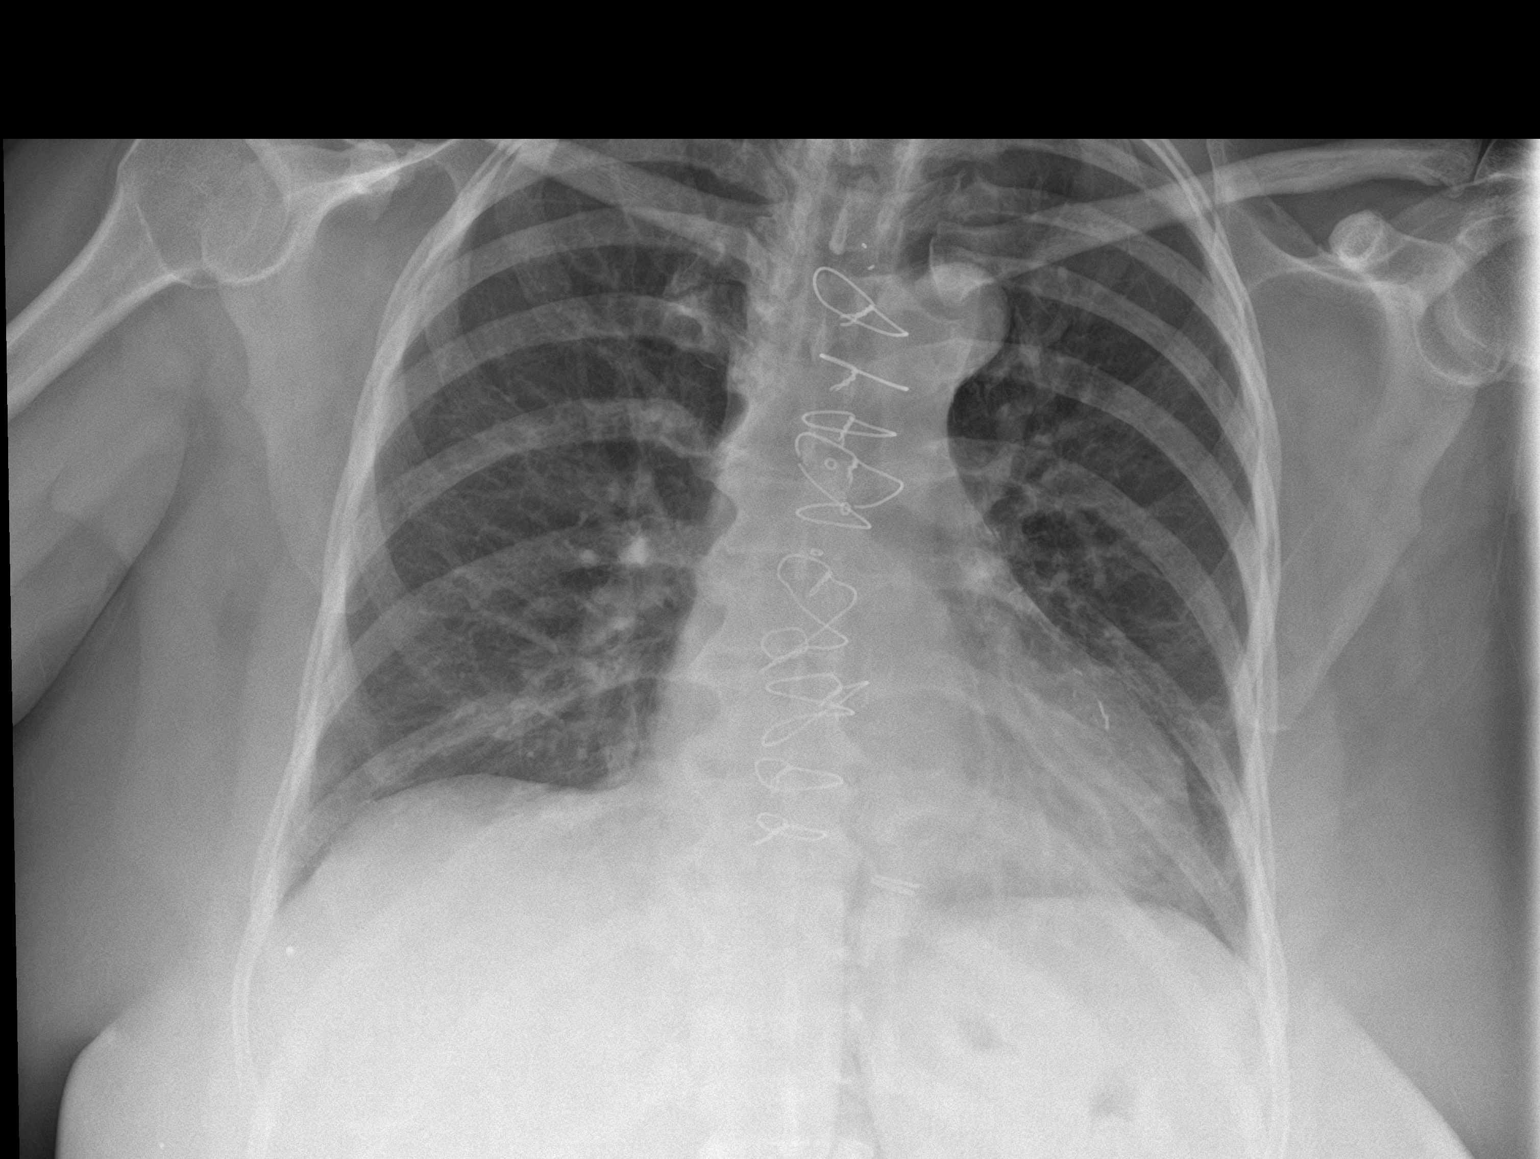

[rib ap]
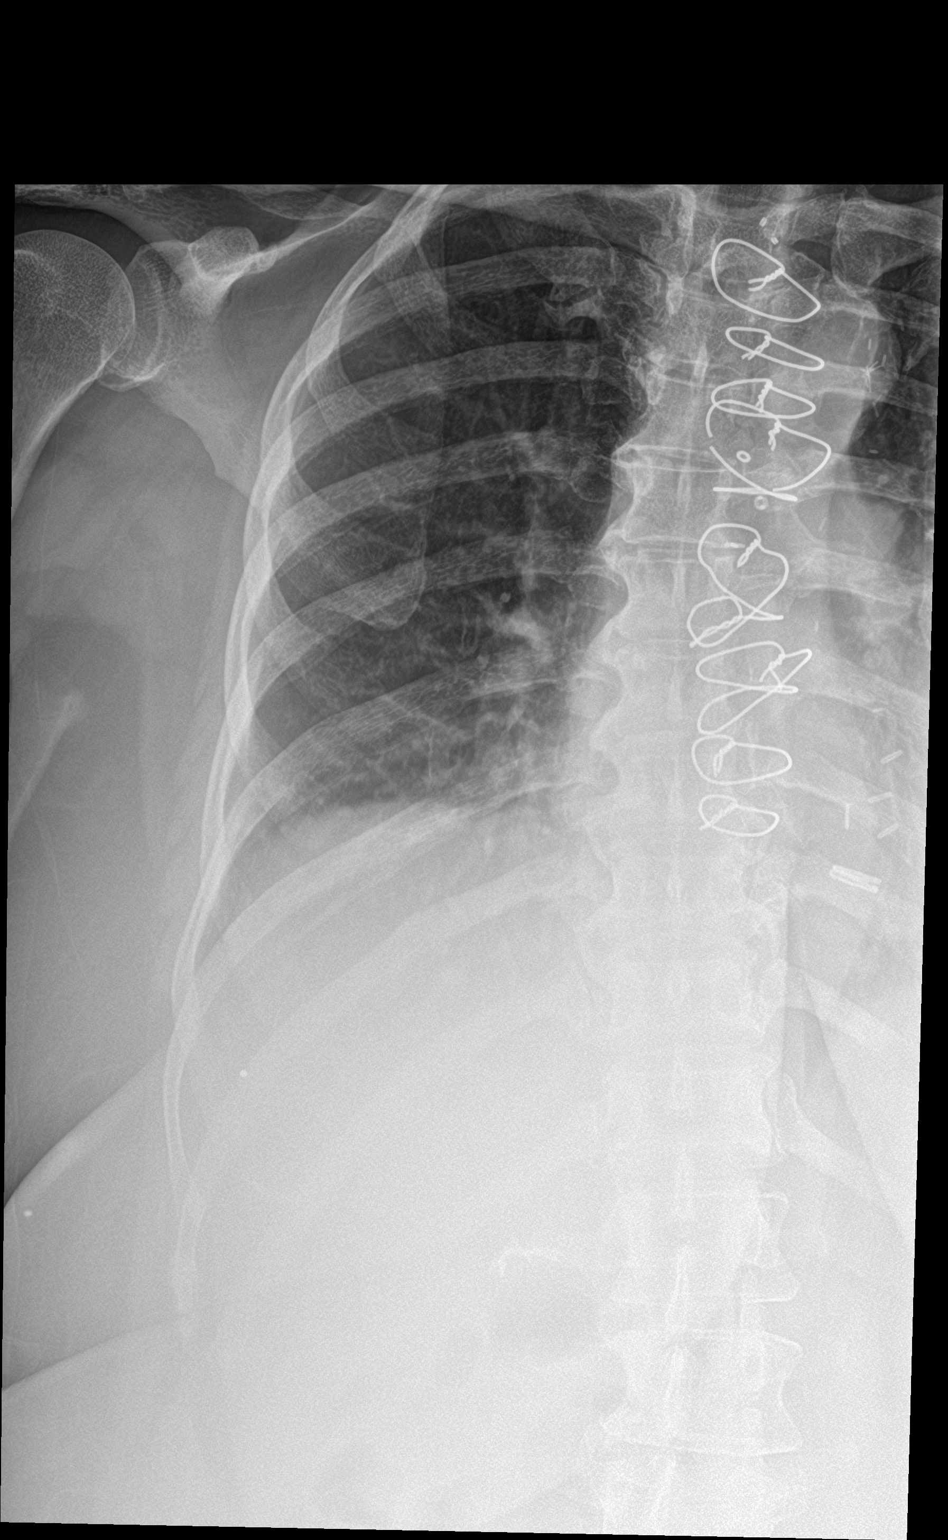

[rib ap obl]
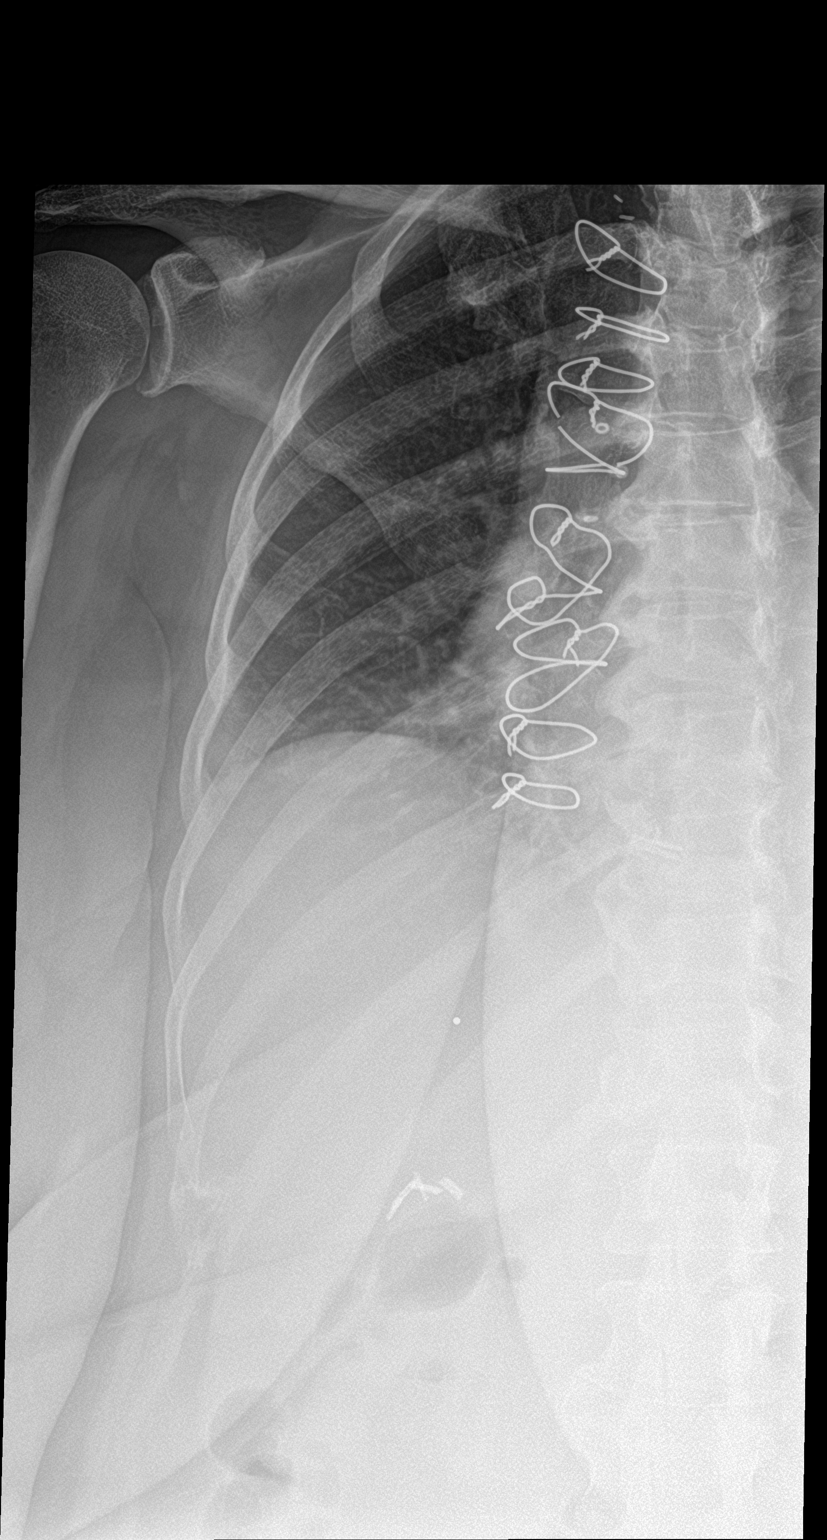

[3 of 3 positions shown; findings below may reference images not displayed]

FINDINGS: No fracture or other bone lesions are seen involving the ribs. There
is no evidence of pneumothorax or pleural effusion. Both lungs are
clear. Heart size and mediastinal contours are within normal limits.
Prior median sternotomy and CABG.
IMPRESSION: Negative.

## 2022-08-03 ENCOUNTER — Ambulatory Visit: Payer: Medicare Other

## 2022-08-03 ENCOUNTER — Encounter: Payer: Medicare Other | Admitting: Physical Therapy

## 2022-08-06 ENCOUNTER — Ambulatory Visit: Payer: Medicare Other

## 2022-08-09 ENCOUNTER — Other Ambulatory Visit: Payer: Self-pay

## 2022-08-10 ENCOUNTER — Encounter: Payer: Medicare Other | Admitting: Physical Therapy

## 2022-08-10 ENCOUNTER — Ambulatory Visit: Payer: Medicare Other

## 2022-08-10 ENCOUNTER — Ambulatory Visit (INDEPENDENT_AMBULATORY_CARE_PROVIDER_SITE_OTHER): Payer: Medicare Other | Admitting: Gastroenterology

## 2022-08-10 ENCOUNTER — Encounter: Payer: Self-pay | Admitting: Gastroenterology

## 2022-08-10 VITALS — BP 135/84 | HR 80 | Temp 98.2°F | Wt 236.0 lb

## 2022-08-10 DIAGNOSIS — Z8619 Personal history of other infectious and parasitic diseases: Secondary | ICD-10-CM | POA: Diagnosis not present

## 2022-08-10 DIAGNOSIS — R159 Full incontinence of feces: Secondary | ICD-10-CM

## 2022-08-10 NOTE — Progress Notes (Signed)
Jonathon Bellows MD, MRCP(U.K) [address]  [place]  [place], [place] [address]  Main: [phone]  Fax: [phone]   Primary Care Physician: Mylinda Latina, PA-C  Primary Gastroenterologist:  Dr. Jonathon Bellows   Chief Complaint  Patient presents with   incontinence of feces    HPI: Felicia Woods is a 67 y.o. female Summary of history :   Seen in 11/2021 for fecal incontinence ongoing for a few months . Has noticed that she leaks stool often before she can reach the restroom or when she coughs along with urine as well.  She has had 3 children by vaginal deliveries first when she was in prolonged labor.  Lost about 10 pounds of weight unintentionally recently no upper abdominal discomfort no change in bowel habits.  No blood in the stool.  Denies any bulging sensation per vagina.  Denies any nosebleeds blood in the urine or any hematemesis.   11/27/2021: Ferritin, iron studies,urine, normal ,    H pylori breath test positive: Commenced on Quadruple therapy with bismuth    12/02/2021:colonoscopy : Poor anal tone, single 5 mm polyp resected. EGD: 10 mm gastric polyp taken out.Benign polyp     Interval history  01/26/2022-08/10/2022   02/21/2022 H. pylori test she was positive given quadruple bismuth based therapy.  Was recommended to have H. pylori rechecked subsequently.  Did not follow-up after  Since the last visit she has undergone pelvic floor exercise therapy and it has helped with her fecal incontinence she still has urine incontinence she does not recollect being seen by neurologist.  Has occasional bowel accidents but overall doing well she does not recollect obtaining the MRI. Current Outpatient Medications  Medication Sig Dispense Refill   amLODipine (NORVASC) 10 MG tablet Take 1 tablet by mouth daily.     aspirin EC 81 MG tablet Take 81 mg by mouth daily.     atorvastatin (LIPITOR) 40 MG tablet Take 1 tablet (40 mg total) by mouth daily. 90 tablet 3   azithromycin  (ZITHROMAX) 250 MG tablet Take one tab a day for 10 days for uri 10 tablet 0   Blood Glucose Monitoring Suppl (ONETOUCH VERIO FLEX SYSTEM) w/Device KIT Use as directed twice a daily DX E11.65 1 kit 0   chlorpheniramine-HYDROcodone (TUSSIONEX) 10-8 MG/5ML Take 5 mLs by mouth every 12 (twelve) hours as needed for cough. 70 mL 0   clopidogrel (PLAVIX) 75 MG tablet Take 1 tablet by mouth daily.     cyclobenzaprine (FLEXERIL) 10 MG tablet Take 1 tablet by mouth at bedtime.     Dulaglutide (TRULICITY) 3 AS/5.0NL SOPN Inject 3 mg as directed once a week. 2 mL 2   empagliflozin (JARDIANCE) 25 MG TABS tablet Take one tab a day for diabetes 90 tablet 3   famotidine (PEPCID) 20 MG tablet Take 1 tablet by mouth 2 (two) times daily.     fluconazole (DIFLUCAN) 150 MG tablet Take 1 tab po once may repeat in 3 days if symptoms persist 3 tablet 0   Fluticasone-Umeclidin-Vilant (TRELEGY ELLIPTA) 100-62.5-25 MCG/ACT AEPB INHALE 1 PUFF INTO THE LUNGS ONCE DAILY     furosemide (LASIX) 40 MG tablet Take 1 tablet (40 mg total) by mouth 2 (two) times daily. 180 tablet 1   glucose blood (ONETOUCH VERIO) test strip Use as instructed twice daily DX E11.65 100 each 3   isosorbide mononitrate (IMDUR) 120 MG 24 hr tablet Take 1 tablet by mouth daily.     losartan (COZAAR)  100 MG tablet Take 1 tablet by mouth daily.     metoprolol succinate (TOPROL-XL) 100 MG 24 hr tablet Take 100 mg by mouth daily.     Misc. Devices (ADJUST BATH/SHOWER SEAT) MISC 1 shower seat for use at home to reduce risk of falling. 1 each 0   montelukast (SINGULAIR) 10 MG tablet Take 1 tablet by mouth at bedtime.     nitroGLYCERIN (NITROSTAT) 0.4 MG SL tablet DISSOLVE ONE TABLET UNDER THE TONGUE EVERY 5 MINUTES AS NEEDED FOR CHEST PAIN.  DO NOT EXCEED A TOTAL OF 3 DOSES IN 15 MINUTES 30 tablet 3   OneTouch Delica Lancets 73U MISC Use as directed twice a daily DX E11.65 100 each 1   oxybutynin (DITROPAN) 5 MG tablet Take 1 tablet by mouth daily.     PROAIR  RESPICLICK 202 (90 Base) MCG/ACT AEPB Inhale 2 puffs into the lungs every 6 (six) hours as needed. 3 each 1   ranolazine (RANEXA) 1000 MG SR tablet Take 1 tablet (1,000 mg total) by mouth 2 (two) times daily. 60 tablet 1   zolpidem (AMBIEN) 5 MG tablet TAKE 1 TABLET BY MOUTH AT BEDTIME AS NEEDED FOR SLEEP 30 tablet 0   No current facility-administered medications for this visit.    Allergies as of 08/10/2022 - Review Complete 08/10/2022  Allergen Reaction Noted   Penicillin g Anaphylaxis and Other (See Comments) 02/10/2015   Penicillins      ROS:  General: Negative for anorexia, weight loss, fever, chills, fatigue, weakness. ENT: Negative for hoarseness, difficulty swallowing , nasal congestion. CV: Negative for chest pain, angina, palpitations, dyspnea on exertion, peripheral edema.  Respiratory: Negative for dyspnea at rest, dyspnea on exertion, cough, sputum, wheezing.  GI: See history of present illness. GU:  Negative for dysuria, hematuria, urinary incontinence, urinary frequency, nocturnal urination.  Endo: Negative for unusual weight change.    Physical Examination:   BP 135/84   Pulse 80   Temp 98.2 F (36.8 C) (Oral)   Wt 236 lb (107 kg)   BMI 36.96 kg/m   General: Well-nourished, well-developed in no acute distress.  Eyes: No icterus. Conjunctivae pink. Mouth: Oropharyngeal mucosa moist and pink , no lesions erythema or exudate. Skin: Warm and dry, no jaundice.   Psych: Alert and cooperative, normal mood and affect.   Imaging Studies: No results found.  Assessment and Plan:   Felicia Woods is a 67 y.o. y/o female here to follow up for fecal incontinence, I performed a colonoscopy and during the procedure I noted she had very poor anal tone.  Otherwise no luminal abnormalities.  She had a history of urinary incontinence in addition has issues with fecal incontinence, prolonged labor during her first pregnancy and concern for a perineal tear.  I I believe she  could have  uterine/bladder/rectal prolapse contributing to this.  Back issues could also be contributing to this.  I had previously advised her to see a urologist which she has not seen.  Recently treated for H. pylori.  Previously had recommended MRI that was ordered but not obtained.  Overall her fecal incontinence has improved with pelvic floor therapy     Plan  H. pylori breath test positive s/p treatment recheck for eradication PRN imodium, will send in a prescription MRI spine was previously ordered by her doctor but not obtained Continue pelvic floor exercises I had previously suggested her to be seen by her urogynecologist to evaluate for pelvic floor dysfunction/uterine bladder rectal wall prolapse but she  has not done so.   Dr Jonathon Bellows  MD,MRCP [facility]) Follow up in as needed

## 2022-08-13 ENCOUNTER — Ambulatory Visit: Payer: Medicare Other

## 2022-08-17 ENCOUNTER — Ambulatory Visit: Payer: Medicare Other

## 2022-08-17 ENCOUNTER — Encounter: Payer: Medicare Other | Admitting: Physical Therapy

## 2022-08-24 ENCOUNTER — Encounter: Payer: Medicare Other | Admitting: Physical Therapy

## 2022-08-26 ENCOUNTER — Ambulatory Visit: Payer: Medicare Other | Admitting: Physician Assistant

## 2022-08-31 ENCOUNTER — Encounter: Payer: Medicare Other | Admitting: Physical Therapy

## 2022-09-02 ENCOUNTER — Other Ambulatory Visit
Admission: RE | Admit: 2022-09-02 | Discharge: 2022-09-02 | Disposition: A | Discharge status: Home / Self Care | Payer: Medicare Other | Attending: Physician Assistant | Admitting: Physician Assistant

## 2022-09-02 DIAGNOSIS — Z01818 Encounter for other preprocedural examination: Secondary | ICD-10-CM | POA: Diagnosis present

## 2022-09-02 DIAGNOSIS — E559 Vitamin D deficiency, unspecified: Secondary | ICD-10-CM | POA: Diagnosis not present

## 2022-09-02 DIAGNOSIS — E782 Mixed hyperlipidemia: Secondary | ICD-10-CM | POA: Insufficient documentation

## 2022-09-02 DIAGNOSIS — I110 Hypertensive heart disease with heart failure: Secondary | ICD-10-CM | POA: Insufficient documentation

## 2022-09-02 DIAGNOSIS — I5032 Chronic diastolic (congestive) heart failure: Secondary | ICD-10-CM | POA: Diagnosis not present

## 2022-09-02 DIAGNOSIS — R5383 Other fatigue: Secondary | ICD-10-CM | POA: Diagnosis not present

## 2022-09-02 DIAGNOSIS — E119 Type 2 diabetes mellitus without complications: Secondary | ICD-10-CM | POA: Insufficient documentation

## 2022-09-02 DIAGNOSIS — I25119 Atherosclerotic heart disease of native coronary artery with unspecified angina pectoris: Secondary | ICD-10-CM | POA: Diagnosis not present

## 2022-09-02 DIAGNOSIS — E538 Deficiency of other specified B group vitamins: Secondary | ICD-10-CM | POA: Diagnosis not present

## 2022-09-02 LAB — COMPREHENSIVE METABOLIC PANEL
ALT: 16 U/L (ref 0–44)
AST: 21 U/L (ref 15–41)
Albumin: 3.7 g/dL (ref 3.5–5.0)
Alkaline Phosphatase: 64 U/L (ref 38–126)
Anion gap: 11 (ref 5–15)
BUN: 15 mg/dL (ref 8–23)
CO2: 26 mmol/L (ref 22–32)
Calcium: 9.5 mg/dL (ref 8.9–10.3)
Chloride: 102 mmol/L (ref 98–111)
Creatinine, Ser: 0.67 mg/dL (ref 0.44–1.00)
GFR, Estimated: >60 mL/min (ref >60)
Glucose, Bld: 177 mg/dL — ABNORMAL HIGH (ref 70–99)
Potassium: 3.6 mmol/L (ref 3.5–5.1)
Sodium: 139 mmol/L (ref 135–145)
Total Bilirubin: 0.7 mg/dL (ref 0.3–1.2)
Total Protein: 7.7 g/dL (ref 6.5–8.1)

## 2022-09-02 LAB — CBC WITH DIFFERENTIAL/PLATELET
Abs Immature Granulocytes: 0.03 10*3/uL (ref 0.00–0.07)
Basophils Absolute: 0 10*3/uL (ref 0.0–0.1)
Basophils Relative: 0 %
Eosinophils Absolute: 0.1 10*3/uL (ref 0.0–0.5)
Eosinophils Relative: 1 %
HCT: 38.8 % (ref 36.0–46.0)
Hemoglobin: 12.2 g/dL (ref 12.0–15.0)
Immature Granulocytes: 0 %
Lymphocytes Relative: 24 %
Lymphs Abs: 2.1 10*3/uL (ref 0.7–4.0)
MCH: 25.2 pg — ABNORMAL LOW (ref 26.0–34.0)
MCHC: 31.4 g/dL (ref 30.0–36.0)
MCV: 80 fL (ref 80.0–100.0)
Monocytes Absolute: 0.4 10*3/uL (ref 0.1–1.0)
Monocytes Relative: 4 %
Neutro Abs: 6 10*3/uL (ref 1.7–7.7)
Neutrophils Relative %: 71 %
Platelets: 227 10*3/uL (ref 150–400)
RBC: 4.85 MIL/uL (ref 3.87–5.11)
RDW: 16.7 % — ABNORMAL HIGH (ref 11.5–15.5)
WBC: 8.5 10*3/uL (ref 4.0–10.5)
nRBC: 0 % (ref 0.0–0.2)

## 2022-09-02 LAB — TSH: TSH: 4.974 u[IU]/mL — ABNORMAL HIGH (ref 0.350–4.500)

## 2022-09-02 LAB — LIPID PANEL
Cholesterol: 167 mg/dL (ref 0–200)
HDL: 41 mg/dL (ref >40)
LDL Cholesterol: 100 mg/dL — ABNORMAL HIGH (ref 0–99)
Total CHOL/HDL Ratio: 4.1 RATIO
Triglycerides: 128 mg/dL (ref <150)
VLDL: 26 mg/dL (ref 0–40)

## 2022-09-02 LAB — FERRITIN: Ferritin: 49 ng/mL (ref 11–307)

## 2022-09-02 LAB — IRON AND TIBC
Iron: 61 ug/dL (ref 28–170)
Saturation Ratios: 17 % (ref 10.4–31.8)
TIBC: 365 ug/dL (ref 250–450)
UIBC: 304 ug/dL

## 2022-09-02 LAB — T4, FREE: Free T4: 0.8 ng/dL (ref 0.61–1.12)

## 2022-09-02 LAB — VITAMIN B12: Vitamin B-12: 375 pg/mL (ref 180–914)

## 2022-09-02 LAB — VITAMIN D 25 HYDROXY (VIT D DEFICIENCY, FRACTURES): Vit D, 25-Hydroxy: 33.37 ng/mL (ref 30–100)

## 2022-09-02 LAB — FOLATE: Folate: 14.5 ng/mL (ref >5.9)

## 2022-09-03 ENCOUNTER — Telehealth: Payer: Self-pay

## 2022-09-03 NOTE — Telephone Encounter (Signed)
PA was done for ProAir RespiClick

## 2022-09-09 ENCOUNTER — Ambulatory Visit: Payer: Medicare Other | Admitting: Physician Assistant

## 2022-09-23 ENCOUNTER — Telehealth: Payer: Self-pay | Admitting: Physician Assistant

## 2022-09-23 NOTE — Telephone Encounter (Signed)
125 pages of MR faxed to [facility]; [phone]

## 2022-09-30 ENCOUNTER — Other Ambulatory Visit: Payer: Self-pay | Admitting: Physician Assistant

## 2022-09-30 ENCOUNTER — Ambulatory Visit: Payer: Medicare Other | Admitting: Physician Assistant

## 2022-09-30 DIAGNOSIS — G4709 Other insomnia: Secondary | ICD-10-CM

## 2022-09-30 MED ORDER — ZOLPIDEM TARTRATE 5 MG PO TABS: 5.0000 mg | ORAL_TABLET | Freq: Every evening | ORAL | 0 refills | Status: DC | PRN

## 2022-10-11 ENCOUNTER — Ambulatory Visit: Payer: Medicare Other | Admitting: Physician Assistant

## 2022-11-15 ENCOUNTER — Ambulatory Visit: Payer: Medicare Other | Admitting: Physician Assistant

## 2022-11-16 ENCOUNTER — Telehealth: Payer: Self-pay

## 2022-11-16 NOTE — Telephone Encounter (Signed)
Pt called that she had allergies ,running nose ,all allergies symptoms Covid test negative,no fever advised her to take OTC Claritin  and take tylenol and she is not feeling better call us back

## 2022-12-17 ENCOUNTER — Encounter: Payer: Self-pay | Admitting: Physician Assistant

## 2022-12-17 ENCOUNTER — Telehealth: Payer: Self-pay | Admitting: Physician Assistant

## 2022-12-17 ENCOUNTER — Ambulatory Visit (INDEPENDENT_AMBULATORY_CARE_PROVIDER_SITE_OTHER): Payer: Medicare Other | Admitting: Physician Assistant

## 2022-12-17 VITALS — BP 130/88 | HR 72 | Temp 98.4°F | Resp 16 | Ht 67.0 in | Wt 238.8 lb

## 2022-12-17 DIAGNOSIS — R300 Dysuria: Secondary | ICD-10-CM

## 2022-12-17 DIAGNOSIS — Z1231 Encounter for screening mammogram for malignant neoplasm of breast: Secondary | ICD-10-CM

## 2022-12-17 DIAGNOSIS — G4709 Other insomnia: Secondary | ICD-10-CM

## 2022-12-17 DIAGNOSIS — E1159 Type 2 diabetes mellitus with other circulatory complications: Secondary | ICD-10-CM

## 2022-12-17 DIAGNOSIS — E782 Mixed hyperlipidemia: Secondary | ICD-10-CM | POA: Diagnosis not present

## 2022-12-17 DIAGNOSIS — E2839 Other primary ovarian failure: Secondary | ICD-10-CM

## 2022-12-17 DIAGNOSIS — I10 Essential (primary) hypertension: Secondary | ICD-10-CM | POA: Diagnosis not present

## 2022-12-17 DIAGNOSIS — J441 Chronic obstructive pulmonary disease with (acute) exacerbation: Secondary | ICD-10-CM

## 2022-12-17 DIAGNOSIS — Z0001 Encounter for general adult medical examination with abnormal findings: Secondary | ICD-10-CM

## 2022-12-17 LAB — POCT GLYCOSYLATED HEMOGLOBIN (HGB A1C): Hemoglobin A1C: 7.5 % — AB (ref 4.0–5.6)

## 2022-12-17 MED ORDER — FUROSEMIDE 40 MG PO TABS: 40.0000 mg | ORAL_TABLET | Freq: Two times a day (BID) | ORAL | 1 refills | Status: DC

## 2022-12-17 MED ORDER — ATORVASTATIN CALCIUM 40 MG PO TABS: 40.0000 mg | ORAL_TABLET | Freq: Every day | ORAL | 3 refills | Status: DC

## 2022-12-17 MED ORDER — EMPAGLIFLOZIN 25 MG PO TABS: ORAL_TABLET | ORAL | 3 refills | Status: DC

## 2022-12-17 MED ORDER — LOSARTAN POTASSIUM 100 MG PO TABS: 100.0000 mg | ORAL_TABLET | Freq: Every day | ORAL | 1 refills | Status: DC

## 2022-12-17 MED ORDER — MONTELUKAST SODIUM 10 MG PO TABS: 10.0000 mg | ORAL_TABLET | Freq: Every day | ORAL | 1 refills | Status: DC

## 2022-12-17 MED ORDER — ZOLPIDEM TARTRATE 5 MG PO TABS: 5.0000 mg | ORAL_TABLET | Freq: Every evening | ORAL | 2 refills | Status: DC | PRN

## 2022-12-17 MED ORDER — HYDROCOD POLI-CHLORPHE POLI ER 10-8 MG/5ML PO SUER: 5.0000 mL | Freq: Two times a day (BID) | ORAL | 0 refills | Status: DC | PRN

## 2022-12-17 MED ORDER — LEVOFLOXACIN 500 MG PO TABS: 500.0000 mg | ORAL_TABLET | Freq: Every day | ORAL | 0 refills | Status: AC

## 2022-12-17 MED ORDER — OXYBUTYNIN CHLORIDE 5 MG PO TABS: 5.0000 mg | ORAL_TABLET | Freq: Every day | ORAL | 1 refills | Status: DC

## 2022-12-17 MED ORDER — FAMOTIDINE 20 MG PO TABS: 20.0000 mg | ORAL_TABLET | Freq: Two times a day (BID) | ORAL | 1 refills | Status: DC

## 2022-12-17 NOTE — Progress Notes (Signed)
[facility] [address] [place], [place] [address]  Internal MEDICINE  Office Visit Note  Patient Name: Felicia Woods  [id number]  [id number]  Date of Service: 12/17/2022  Chief Complaint  Patient presents with   Diabetes   Hypertension   Hyperlipidemia   Medicare Wellness     HPI Pt is here for routine health maintenance examination and sick visit -Not feeling well today, states she has been coughing for the last month. Walk in clinic diagnosed her with sinusitis and bronchitis and gave her zpak, prednisone, and tessalon. States a chest xray was done and looked ok at the time. She reports no improvement in symptoms and is coughing all the time and limiting her sleep -Taking trelegy since the 15th and albuterol prn/neb every night -due for eye exam in July and will schedule -Foot exam done by podiatry -sees cardiology in August -Knee replacement in oct for right knee -Last mammogram in Jan and will have bone density with it when due for annual again  Current Medication: Outpatient Encounter Medications as of 12/17/2022  Medication Sig Note   amLODipine (NORVASC) 10 MG tablet Take 1 tablet by mouth daily.    aspirin EC 81 MG tablet Take 81 mg by mouth daily.    azithromycin (ZITHROMAX) 250 MG tablet Take one tab a day for 10 days for uri    Blood Glucose Monitoring Suppl (ONETOUCH VERIO FLEX SYSTEM) w/Device KIT Use as directed twice a daily DX E11.65    clopidogrel (PLAVIX) 75 MG tablet Take 1 tablet by mouth daily.    cyclobenzaprine (FLEXERIL) 10 MG tablet Take 1 tablet by mouth at bedtime.    Dulaglutide (TRULICITY) 3 MG/0.5ML SOPN Inject 3 mg as directed once a week.    fluconazole (DIFLUCAN) 150 MG tablet Take 1 tab po once may repeat in 3 days if symptoms persist    Fluticasone-Umeclidin-Vilant (TRELEGY ELLIPTA) 100-62.5-25 MCG/ACT AEPB INHALE 1 PUFF INTO THE LUNGS ONCE DAILY    glucose blood (ONETOUCH VERIO) test strip Use as instructed twice daily DX  E11.65    isosorbide mononitrate (IMDUR) 120 MG 24 hr tablet Take 1 tablet by mouth daily.    levofloxacin (LEVAQUIN) 500 MG tablet Take 1 tablet (500 mg total) by mouth daily for 7 days.    metoprolol succinate (TOPROL-XL) 100 MG 24 hr tablet Take 100 mg by mouth daily.    Misc. Devices (ADJUST BATH/SHOWER SEAT) MISC 1 shower seat for use at home to reduce risk of falling.    nitroGLYCERIN (NITROSTAT) 0.4 MG SL tablet DISSOLVE ONE TABLET UNDER THE TONGUE EVERY 5 MINUTES AS NEEDED FOR CHEST PAIN.  DO NOT EXCEED A TOTAL OF 3 DOSES IN 15 MINUTES    OneTouch Delica Lancets 30G MISC Use as directed twice a daily DX E11.65    PROAIR RESPICLICK 108 (90 Base) MCG/ACT AEPB Inhale 2 puffs into the lungs every 6 (six) hours as needed.    ranolazine (RANEXA) 1000 MG SR tablet Take 1 tablet (1,000 mg total) by mouth 2 (two) times daily.    [DISCONTINUED] atorvastatin (LIPITOR) 40 MG tablet Take 1 tablet (40 mg total) by mouth daily.    [DISCONTINUED] chlorpheniramine-HYDROcodone (TUSSIONEX) 10-8 MG/5ML Take 5 mLs by mouth every 12 (twelve) hours as needed for cough. 06/17/2022: Never took bc insurance would not cover    [DISCONTINUED] empagliflozin (JARDIANCE) 25 MG TABS tablet Take one tab a day for diabetes    [DISCONTINUED] famotidine (PEPCID) 20 MG tablet Take 1 tablet by  mouth 2 (two) times daily.    [DISCONTINUED] furosemide (LASIX) 40 MG tablet Take 1 tablet (40 mg total) by mouth 2 (two) times daily.    [DISCONTINUED] losartan (COZAAR) 100 MG tablet Take 1 tablet by mouth daily.    [DISCONTINUED] montelukast (SINGULAIR) 10 MG tablet Take 1 tablet by mouth at bedtime.    [DISCONTINUED] oxybutynin (DITROPAN) 5 MG tablet Take 1 tablet by mouth daily.    [DISCONTINUED] zolpidem (AMBIEN) 5 MG tablet Take 1 tablet (5 mg total) by mouth at bedtime as needed. for sleep    atorvastatin (LIPITOR) 40 MG tablet Take 1 tablet (40 mg total) by mouth daily.    chlorpheniramine-HYDROcodone (TUSSIONEX) 10-8 MG/5ML  Take 5 mLs by mouth every 12 (twelve) hours as needed for cough.    empagliflozin (JARDIANCE) 25 MG TABS tablet Take one tab a day for diabetes    famotidine (PEPCID) 20 MG tablet Take 1 tablet (20 mg total) by mouth 2 (two) times daily.    furosemide (LASIX) 40 MG tablet Take 1 tablet (40 mg total) by mouth 2 (two) times daily.    losartan (COZAAR) 100 MG tablet Take 1 tablet (100 mg total) by mouth daily.    montelukast (SINGULAIR) 10 MG tablet Take 1 tablet (10 mg total) by mouth at bedtime.    oxybutynin (DITROPAN) 5 MG tablet Take 1 tablet (5 mg total) by mouth daily.    zolpidem (AMBIEN) 5 MG tablet Take 1 tablet (5 mg total) by mouth at bedtime as needed. for sleep    No facility-administered encounter medications on file as of 12/17/2022.    Surgical History: Past Surgical History:  Procedure Laterality Date   ABDOMINAL HYSTERECTOMY     CARDIAC CATHETERIZATION     CARDIAC CATHETERIZATION N/A 10/18/2015   Procedure: Left Heart Cath and Cors/Grafts Angiography;  Surgeon: Runell Gess, MD;  Location: [facility] INVASIVE CV LAB;  Service: Cardiovascular;  Laterality: N/A;   CARDIAC CATHETERIZATION N/A 10/18/2015   Procedure: Coronary Stent Intervention;  Surgeon: Runell Gess, MD;  Location: [facility] INVASIVE CV LAB;  Service: Cardiovascular;  Laterality: N/A;   CARDIAC SURGERY     CHOLECYSTECTOMY     COLONOSCOPY WITH PROPOFOL N/A 12/02/2021   Procedure: COLONOSCOPY WITH PROPOFOL;  Surgeon: Wyline Mood, MD;  Location: [facility] ENDOSCOPY;  Service: Gastroenterology;  Laterality: N/A;   CORONARY ANGIOPLASTY     CORONARY ARTERY BYPASS GRAFT     4 vessels - 2010   CORONARY STENT INTERVENTION N/A 02/16/2017   Procedure: CORONARY STENT INTERVENTION;  Surgeon: Alwyn Pea, MD;  Location: [facility] INVASIVE CV LAB;  Service: Cardiovascular;  Laterality: N/A;   CORONARY STENT INTERVENTION N/A 02/13/2019   Procedure: CORONARY STENT INTERVENTION;  Surgeon: Yvonne Kendall, MD;  Location: [facility] INVASIVE CV  LAB;  Service: Cardiovascular;  Laterality: N/A;  SVG to RCA   CORONARY STENT INTERVENTION Left 03/08/2019   Procedure: CORONARY STENT INTERVENTION;  Surgeon: Alwyn Pea, MD;  Location: [facility] INVASIVE CV LAB;  Service: Cardiovascular;  Laterality: Left;   CORONARY STENT INTERVENTION N/A 11/04/2020   Procedure: CORONARY STENT INTERVENTION;  Surgeon: Yvonne Kendall, MD;  Location: [facility] INVASIVE CV LAB;  Service: Cardiovascular;  Laterality: N/A;   CORONARY STENT INTERVENTION N/A 12/24/2021   Procedure: CORONARY STENT INTERVENTION;  Surgeon: Armando Reichert, MD;  Location: [facility] INVASIVE CV LAB;  Service: Cardiovascular;  Laterality: N/A;   ESOPHAGOGASTRODUODENOSCOPY N/A 12/02/2021   Procedure: ESOPHAGOGASTRODUODENOSCOPY (EGD);  Surgeon: Wyline Mood, MD;  Location: [facility] ENDOSCOPY;  Service:  Gastroenterology;  Laterality: N/A;   LEFT HEART CATH AND CORONARY ANGIOGRAPHY Left 02/16/2017   Procedure: LEFT HEART CATH AND CORONARY ANGIOGRAPHY;  Surgeon: Lamar Blinks, MD;  Location: [facility] INVASIVE CV LAB;  Service: Cardiovascular;  Laterality: Left;   LEFT HEART CATH AND CORONARY ANGIOGRAPHY Left 12/24/2021   Procedure: LEFT HEART CATH AND CORONARY ANGIOGRAPHY;  Surgeon: Lamar Blinks, MD;  Location: [facility] INVASIVE CV LAB;  Service: Cardiovascular;  Laterality: Left;   LEFT HEART CATH AND CORONARY ANGIOGRAPHY Left 06/17/2022   Procedure: LEFT HEART CATH AND CORONARY ANGIOGRAPHY;  Surgeon: Alwyn Pea, MD;  Location: [facility] INVASIVE CV LAB;  Service: Cardiovascular;  Laterality: Left;   LEFT HEART CATH AND CORS/GRAFTS ANGIOGRAPHY Left 11/23/2017   Procedure: LEFT HEART CATH AND CORS/GRAFTS ANGIOGRAPHY;  Surgeon: Lamar Blinks, MD;  Location: [facility] INVASIVE CV LAB;  Service: Cardiovascular;  Laterality: Left;   LEFT HEART CATH AND CORS/GRAFTS ANGIOGRAPHY N/A 02/13/2019   Procedure: LEFT HEART CATH AND CORS/GRAFTS ANGIOGRAPHY;  Surgeon: Lamar Blinks, MD;  Location: [facility] INVASIVE CV LAB;   Service: Cardiovascular;  Laterality: N/A;   LEFT HEART CATH AND CORS/GRAFTS ANGIOGRAPHY N/A 07/03/2019   Procedure: LEFT HEART CATH AND CORS/GRAFTS ANGIOGRAPHY;  Surgeon: Lamar Blinks, MD;  Location: [facility] INVASIVE CV LAB;  Service: Cardiovascular;  Laterality: N/A;   LEFT HEART CATH AND CORS/GRAFTS ANGIOGRAPHY N/A 11/04/2020   Procedure: LEFT HEART CATH AND CORS/GRAFTS ANGIOGRAPHY;  Surgeon: Lamar Blinks, MD;  Location: [facility] INVASIVE CV LAB;  Service: Cardiovascular;  Laterality: N/A;    Medical History: Past Medical History:  Diagnosis Date   Asthma    Coronary artery disease    Diabetes mellitus without complication (HCC)    Heart attack (HCC)    Hyperlipidemia    Hypertension    MI (myocardial infarction) (HCC)    Migraine headache with aura    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    Family History: Family History  Problem Relation Age of Onset   Diabetes Mother    Diabetes Father    Cancer Father    Diabetes Brother       Review of Systems  Constitutional:  Positive for fatigue. Negative for chills and unexpected weight change.  HENT:  Positive for congestion and postnasal drip. Negative for rhinorrhea, sneezing and sore throat.   Eyes:  Negative for redness.  Respiratory:  Positive for cough. Negative for chest tightness.   Cardiovascular:  Negative for chest pain and palpitations.  Gastrointestinal:  Negative for abdominal pain, constipation, diarrhea, nausea and vomiting.  Genitourinary:  Negative for dysuria and frequency.  Musculoskeletal:  Negative for arthralgias, back pain, joint swelling and neck pain.  Skin:  Negative for rash.  Neurological: Negative.  Negative for tremors and numbness.  Hematological:  Negative for adenopathy. Does not bruise/bleed easily.  Psychiatric/Behavioral:  Positive for sleep disturbance. Negative for behavioral problems (Depression) and suicidal ideas. The patient is not nervous/anxious.      Vital Signs: BP 130/88    Pulse 72   Temp 98.4 F (36.9 C)   Resp 16   Ht 5\' 7"  (1.702 m)   Wt 238 lb 12.8 oz (108.3 kg)   SpO2 99%   BMI 37.40 kg/m    Physical Exam Vitals and nursing note reviewed.  Constitutional:      Appearance: Normal appearance. She is obese.  HENT:     Head: Normocephalic and atraumatic.     Mouth/Throat:     Mouth: Mucous membranes  are moist.     Pharynx: No posterior oropharyngeal erythema.  Eyes:     Extraocular Movements: Extraocular movements intact.     Pupils: Pupils are equal, round, and reactive to light.  Cardiovascular:     Rate and Rhythm: Normal rate and regular rhythm.     Pulses: Normal pulses.     Heart sounds: Normal heart sounds.  Pulmonary:     Effort: Pulmonary effort is normal.     Breath sounds: No wheezing.     Comments: Patient coughing throughout exam when taking deep breaths Abdominal:     General: Abdomen is flat.     Palpations: Abdomen is soft.     Tenderness: There is no abdominal tenderness.  Skin:    General: Skin is warm and dry.  Neurological:     General: No focal deficit present.     Mental Status: She is alert.  Psychiatric:        Mood and Affect: Mood normal.        Behavior: Behavior normal.      LABS: Recent Results (from the past 2160 hour(s))  POCT glycosylated hemoglobin (Hb A1C)     Status: Abnormal   Collection Time: 12/17/22 10:40 AM  Result Value Ref Range   Hemoglobin A1C 7.5 (A) 4.0 - 5.6 %   HbA1c POC (<> result, manual entry)     HbA1c, POC (prediabetic range)     HbA1c, POC (controlled diabetic range)          Assessment/Plan: 1. Encounter for general adult medical examination with abnormal findings CPE performed, due for bone density   2. Essential hypertension Continue current medications, also followed by cardiology - furosemide (LASIX) 40 MG tablet; Take 1 tablet (40 mg total) by mouth 2 (two) times daily.  Dispense: 180 tablet; Refill: 1  3. Type 2 diabetes mellitus with other circulatory  complication, without long-term current use of insulin (HCC) - POCT glycosylated hemoglobin (Hb A1C) is 7.5 which is improved from 7.9 last check despite steroids. continue current medications and monitoring - empagliflozin (JARDIANCE) 25 MG TABS tablet; Take one tab a day for diabetes  Dispense: 90 tablet; Refill: 3  4. Other insomnia - zolpidem (AMBIEN) 5 MG tablet; Take 1 tablet (5 mg total) by mouth at bedtime as needed. for sleep  Dispense: 30 tablet; Refill: 2  5. Mixed hyperlipidemia - atorvastatin (LIPITOR) 40 MG tablet; Take 1 tablet (40 mg total) by mouth daily.  Dispense: 90 tablet; Refill: 3  6. Visit for screening mammogram - MM 3D SCREENING MAMMOGRAM BILATERAL BREAST; Future  7. Primary ovarian failure - DG Bone Density; Future  8. Chronic obstructive pulmonary disease with acute exacerbation (HCC) Will treat with levaquin and tussionex as needed--aware of side effects and to avoid with ambien. Continue inhalers and call if not imprpoving--may need repeat xray - levofloxacin (LEVAQUIN) 500 MG tablet; Take 1 tablet (500 mg total) by mouth daily for 7 days.  Dispense: 7 tablet; Refill: 0 - chlorpheniramine-HYDROcodone (TUSSIONEX) 10-8 MG/5ML; Take 5 mLs by mouth every 12 (twelve) hours as needed for cough.  Dispense: 70 mL; Refill: 0  9. Dysuria - UA/M w/rflx Culture, Routine   General Counseling: Quaniyah verbalizes understanding of the findings of todays visit and agrees with plan of treatment. I have discussed any further diagnostic evaluation that may be needed or ordered today. We also reviewed her medications today. she has been encouraged to call the office with any questions or concerns that should arise related  to todays visit.    Counseling:    Orders Placed This Encounter  Procedures   MM 3D SCREENING MAMMOGRAM BILATERAL BREAST   DG Bone Density   UA/M w/rflx Culture, Routine   POCT glycosylated hemoglobin (Hb A1C)    Meds ordered this encounter   Medications   zolpidem (AMBIEN) 5 MG tablet    Sig: Take 1 tablet (5 mg total) by mouth at bedtime as needed. for sleep    Dispense:  30 tablet    Refill:  2   losartan (COZAAR) 100 MG tablet    Sig: Take 1 tablet (100 mg total) by mouth daily.    Dispense:  90 tablet    Refill:  1   oxybutynin (DITROPAN) 5 MG tablet    Sig: Take 1 tablet (5 mg total) by mouth daily.    Dispense:  90 tablet    Refill:  1   famotidine (PEPCID) 20 MG tablet    Sig: Take 1 tablet (20 mg total) by mouth 2 (two) times daily.    Dispense:  90 tablet    Refill:  1   montelukast (SINGULAIR) 10 MG tablet    Sig: Take 1 tablet (10 mg total) by mouth at bedtime.    Dispense:  90 tablet    Refill:  1   empagliflozin (JARDIANCE) 25 MG TABS tablet    Sig: Take one tab a day for diabetes    Dispense:  90 tablet    Refill:  3   atorvastatin (LIPITOR) 40 MG tablet    Sig: Take 1 tablet (40 mg total) by mouth daily.    Dispense:  90 tablet    Refill:  3   furosemide (LASIX) 40 MG tablet    Sig: Take 1 tablet (40 mg total) by mouth 2 (two) times daily.    Dispense:  180 tablet    Refill:  1   levofloxacin (LEVAQUIN) 500 MG tablet    Sig: Take 1 tablet (500 mg total) by mouth daily for 7 days.    Dispense:  7 tablet    Refill:  0   chlorpheniramine-HYDROcodone (TUSSIONEX) 10-8 MG/5ML    Sig: Take 5 mLs by mouth every 12 (twelve) hours as needed for cough.    Dispense:  70 mL    Refill:  0    This patient was seen by Lynn Ito, PA-C in collaboration with Dr. Beverely Risen as a part of collaborative care agreement.  Total time spent:35 Minutes  Time spent includes review of chart, medications, test results, and follow up plan with the patient.     Lyndon Code, MD  Internal Medicine

## 2022-12-17 NOTE — Telephone Encounter (Signed)
Mammo and dexa order faxed to [facility]

## 2022-12-18 LAB — UA/M W/RFLX CULTURE, ROUTINE
Bilirubin, UA: NEGATIVE
Glucose, UA: NEGATIVE
Ketones, UA: NEGATIVE
Leukocytes,UA: NEGATIVE
Nitrite, UA: NEGATIVE
Protein,UA: NEGATIVE
RBC, UA: NEGATIVE
Specific Gravity, UA: 1.015 (ref 1.005–1.030)
Urobilinogen, Ur: 1 mg/dL (ref 0.2–1.0)
pH, UA: 7.5 (ref 5.0–7.5)

## 2022-12-18 LAB — MICROSCOPIC EXAMINATION
Bacteria, UA: NONE SEEN
Casts: NONE SEEN /lpf
RBC, Urine: NONE SEEN /hpf (ref 0–2)
WBC, UA: NONE SEEN /hpf (ref 0–5)

## 2022-12-21 ENCOUNTER — Other Ambulatory Visit: Payer: Self-pay

## 2022-12-21 DIAGNOSIS — E1159 Type 2 diabetes mellitus with other circulatory complications: Secondary | ICD-10-CM

## 2022-12-21 MED ORDER — EMPAGLIFLOZIN 25 MG PO TABS: ORAL_TABLET | ORAL | 3 refills | Status: DC

## 2022-12-24 ENCOUNTER — Other Ambulatory Visit: Payer: Self-pay

## 2022-12-24 ENCOUNTER — Telehealth: Payer: Self-pay

## 2022-12-24 MED ORDER — BENZONATATE 200 MG PO CAPS: 200.0000 mg | ORAL_CAPSULE | Freq: Two times a day (BID) | ORAL | 0 refills | Status: DC | PRN

## 2022-12-24 NOTE — Telephone Encounter (Signed)
As per alyssa send Benzonatate 200 mg BID as needed 30 tab

## 2022-12-30 LAB — HM DEXA SCAN: HM Dexa Scan: NORMAL

## 2023-01-11 ENCOUNTER — Ambulatory Visit (INDEPENDENT_AMBULATORY_CARE_PROVIDER_SITE_OTHER): Payer: Medicare Other | Admitting: Internal Medicine

## 2023-01-11 ENCOUNTER — Encounter: Payer: Self-pay | Admitting: Internal Medicine

## 2023-01-11 VITALS — BP 132/90 | HR 77 | Temp 98.3°F | Resp 16 | Ht 67.0 in | Wt 232.6 lb

## 2023-01-11 DIAGNOSIS — J4520 Mild intermittent asthma, uncomplicated: Secondary | ICD-10-CM

## 2023-01-11 DIAGNOSIS — R053 Chronic cough: Secondary | ICD-10-CM

## 2023-01-11 DIAGNOSIS — K219 Gastro-esophageal reflux disease without esophagitis: Secondary | ICD-10-CM

## 2023-01-11 DIAGNOSIS — R0602 Shortness of breath: Secondary | ICD-10-CM

## 2023-01-11 MED ORDER — BREZTRI AEROSPHERE 160-9-4.8 MCG/ACT IN AERO: 2.0000 | INHALATION_SPRAY | Freq: Two times a day (BID) | RESPIRATORY_TRACT | 11 refills | Status: DC

## 2023-01-11 NOTE — Patient Instructions (Signed)
Chronic Obstructive Pulmonary Disease  Chronic obstructive pulmonary disease (COPD) is a long-term (chronic) lung problem. When you have COPD, it is hard for air to get in and out of your lungs. Usually the condition gets worse over time, and your lungs will never return to normal. There are things you can do to keep yourself as healthy as possible. What are the causes? Smoking. This is the most common cause. Certain genes passed from parent to child (inherited). What increases the risk? Being exposed to secondhand smoke from cigarettes, pipes, or cigars. Being exposed to chemicals and other irritants, such as fumes and dust in the work environment. Having chronic lung conditions or infections. What are the signs or symptoms? Shortness of breath, especially during physical activity. A long-term cough with a large amount of thick mucus. Sometimes, the cough may not have any mucus (dry cough). Wheezing. Breathing quickly. Skin that looks gray or blue, especially in the fingers, toes, or lips. Feeling tired (fatigue). Weight loss. Chest tightness. Having infections often. Episodes when breathing symptoms become much worse (exacerbations). At the later stages of this disease, you may have swelling in the ankles, feet, or legs. How is this treated? Taking medicines. Quitting smoking, if you smoke. Rehabilitation. This includes steps to make your body work better. It may involve a team of specialists. Doing exercises. Making changes to your diet. Using oxygen. Lung surgery. Lung transplant. Comfort measures (palliative care). Follow these instructions at home: Medicines Take over-the-counter and prescription medicines only as told by your doctor. Talk to your doctor before taking any cough or allergy medicines. You may need to avoid medicines that cause your lungs to be dry. Lifestyle If you smoke, stop smoking. Smoking makes the problem worse. Do not smoke or use any products that  contain nicotine or tobacco. If you need help quitting, ask your doctor. Avoid being around things that make your breathing worse. This may include smoke, chemicals, and fumes. Stay active, but remember to rest as well. Learn and use tips on how to manage stress and control your breathing. Make sure you get enough sleep. Most adults need at least 7 hours of sleep every night. Eat healthy foods. Eat smaller meals more often. Rest before meals. Controlled breathing Learn and use tips on how to control your breathing as told by your doctor. Try: Breathing in (inhaling) through your nose for 1 second. Then, pucker your lips and breath out (exhale) through your lips for 2 seconds. Putting one hand on your belly (abdomen). Breathe in slowly through your nose for 1 second. Your hand on your belly should move out. Pucker your lips and breathe out slowly through your lips. Your hand on your belly should move in as you breathe out.  Controlled coughing Learn and use controlled coughing to clear mucus from your lungs. Follow these steps: Lean your head a little forward. Breathe in deeply. Try to hold your breath for 3 seconds. Keep your mouth slightly open while coughing 2 times. Spit any mucus out into a tissue. Rest and do the steps again 1 or 2 times as needed. General instructions Make sure you get all the shots (vaccines) that your doctor recommends. Ask your doctor about a flu shot and a pneumonia shot. Use oxygen therapy and pulmonary rehabilitation if told by your doctor. If you need home oxygen therapy, ask your doctor if you should buy a tool to measure your oxygen level (oximeter). Make a COPD action plan with your doctor. This helps you  to know what to do if you feel worse than usual. Manage any other conditions you have as told by your doctor. Avoid going outside when it is very hot, cold, or humid. Avoid people who have a sickness you can catch (contagious). Keep all follow-up  visits. Contact a doctor if: You cough up more mucus than usual. There is a change in the color or thickness of the mucus. It is harder to breathe than usual. Your breathing is faster than usual. You have trouble sleeping. You need to use your medicines more often than usual. You have trouble doing your normal activities such as getting dressed or walking around the house. Get help right away if: You have shortness of breath while resting. You have shortness of breath that stops you from: Being able to talk. Doing normal activities. Your chest hurts for longer than 5 minutes. Your skin color is more blue than usual. Your pulse oximeter shows that you have low oxygen for longer than 5 minutes. You have a fever. You feel too tired to breathe normally. These symptoms may represent a serious problem that is an emergency. Do not wait to see if the symptoms will go away. Get medical help right away. Call your local emergency services (911 in the [place] Do not drive yourself to the hospital. Summary Chronic obstructive pulmonary disease (COPD) is a long-term lung problem. The way your lungs work will never return to normal. Usually the condition gets worse over time. There are things you can do to keep yourself as healthy as possible. Take over-the-counter and prescription medicines only as told by your doctor. If you smoke, stop. Smoking makes the problem worse. This information is not intended to replace advice given to you by your health care provider. Make sure you discuss any questions you have with your health care provider. Document Revised: 04/28/2020 Document Reviewed: 04/29/2020 Elsevier Patient Education  2024 Elsevier Inc. Cough, Adult A cough helps to clear your throat and lungs. It may be a sign of an illness or another condition. A short-term (acute) cough may last 2-3 weeks. A long-term (chronic) cough may last 8 or more weeks. Many things can cause a cough. They  include: Illnesses such as: An infection in your throat or lungs. Asthma or other heart or lung problems. Gastroesophageal reflux. This is when acid comes back up from your stomach. Breathing in things that bother (irritate) your lungs. Allergies. Postnasal drip. This is when mucus runs down the back of your throat. Smoking. Some medicines. Follow these instructions at home: Medicines Take over-the-counter and prescription medicines only as told by your doctor. Talk with your doctor before you take cough medicine (cough suppressants). Eating and drinking Do not drink alcohol. Do not drink caffeine. Drink enough fluid to keep your pee (urine) pale yellow. Lifestyle Stay away from cigarette smoke. Do not smoke or use any products that contain nicotine or tobacco. If you need help quitting, ask your doctor. Stay away from things that make you cough. These may include perfume, candles, cleaning products, or campfire smoke. General instructions  Watch for any changes to your cough. Tell your doctor about them. Always cover your mouth when you cough. If the air is dry in your home, use a cool mist vaporizer or humidifier. If your cough is worse at night, try using extra pillows to raise your head up higher while you sleep. Rest as needed. Contact a doctor if: You have new symptoms. Your symptoms get worse. You cough up  pus. You have a fever that does not go away. Your cough does not get better after 2-3 weeks. Cough medicine does not help, and you are not sleeping well. You have pain that gets worse or is not helped with medicine. You are losing weight and do not know why. You have night sweats. Get help right away if: You cough up blood. You have trouble breathing. Your heart is beating very fast. These symptoms may be an emergency. Get help right away. Call 911. Do not wait to see if the symptoms will go away. Do not drive yourself to the hospital. This information is not  intended to replace advice given to you by your health care provider. Make sure you discuss any questions you have with your health care provider. Document Revised: 02/19/2022 Document Reviewed: 02/19/2022 Elsevier Patient Education  2024 [employer].

## 2023-01-11 NOTE — Progress Notes (Signed)
[facility] [address] [place], [place] [address]  Pulmonary Sleep Medicine   Office Visit Note  Patient Name: Felicia Woods DOB: 02-15-1956 MRN [id number]  Date of Service: 01/11/2023  Complaints/HPI: She states she has had a cough for a month. She had some bronchitis at the outset. She was treated with a cough pill prednisone and abx. She states this did not seem to help. She states she is worse in the evening. She states it is worse when she lays down. She sleeps elevated but still coughs. She states she has gained weight. Patient had pneumonia in 1996. She states never smoked and is not around smokers. She has history of asthma. She states she uses trelegy but does not really have improvement. Her house has had mold in the bathroom. Not sure how old her house is. Patient works as a Conservation officer, nature at [employer].      ROS  General: (-) fever, (-) chills, (-) night sweats, (-) weakness Skin: (-) rashes, (-) itching,. Eyes: (-) visual changes, (-) redness, (-) itching. Nose and Sinuses: (-) nasal stuffiness or itchiness, (-) postnasal drip, (-) nosebleeds, (-) sinus trouble. Mouth and Throat: (-) sore throat, (-) hoarseness. Neck: (-) swollen glands, (-) enlarged thyroid, (-) neck pain. Respiratory: + cough, (-) bloody sputum, + shortness of breath, + wheezing. Cardiovascular: - ankle swelling, (-) chest pain. Lymphatic: (-) lymph node enlargement. Neurologic: (-) numbness, (-) tingling. Psychiatric: (-) anxiety, (-) depression   Current Medication: Outpatient Encounter Medications as of 01/11/2023  Medication Sig   amLODipine (NORVASC) 10 MG tablet Take 1 tablet by mouth daily.   aspirin EC 81 MG tablet Take 81 mg by mouth daily.   atorvastatin (LIPITOR) 40 MG tablet Take 1 tablet (40 mg total) by mouth daily.   azithromycin (ZITHROMAX) 250 MG tablet Take one tab a day for 10 days for uri   benzonatate (TESSALON) 200 MG capsule Take 1 capsule (200 mg total) by mouth  2 (two) times daily as needed for cough.   Blood Glucose Monitoring Suppl (ONETOUCH VERIO FLEX SYSTEM) w/Device KIT Use as directed twice a daily DX E11.65   clopidogrel (PLAVIX) 75 MG tablet Take 1 tablet by mouth daily.   cyclobenzaprine (FLEXERIL) 10 MG tablet Take 1 tablet by mouth at bedtime.   Dulaglutide (TRULICITY) 3 MG/0.5ML SOPN Inject 3 mg as directed once a week.   empagliflozin (JARDIANCE) 25 MG TABS tablet Take one tab a day for diabetes   famotidine (PEPCID) 20 MG tablet Take 1 tablet (20 mg total) by mouth 2 (two) times daily.   fluconazole (DIFLUCAN) 150 MG tablet Take 1 tab po once may repeat in 3 days if symptoms persist   Fluticasone-Umeclidin-Vilant (TRELEGY ELLIPTA) 100-62.5-25 MCG/ACT AEPB INHALE 1 PUFF INTO THE LUNGS ONCE DAILY   furosemide (LASIX) 40 MG tablet Take 1 tablet (40 mg total) by mouth 2 (two) times daily.   glucose blood (ONETOUCH VERIO) test strip Use as instructed twice daily DX E11.65   isosorbide mononitrate (IMDUR) 120 MG 24 hr tablet Take 1 tablet by mouth daily.   losartan (COZAAR) 100 MG tablet Take 1 tablet (100 mg total) by mouth daily.   metoprolol succinate (TOPROL-XL) 100 MG 24 hr tablet Take 100 mg by mouth daily.   Misc. Devices (ADJUST BATH/SHOWER SEAT) MISC 1 shower seat for use at home to reduce risk of falling.   montelukast (SINGULAIR) 10 MG tablet Take 1 tablet (10 mg total) by mouth at bedtime.  nitroGLYCERIN (NITROSTAT) 0.4 MG SL tablet DISSOLVE ONE TABLET UNDER THE TONGUE EVERY 5 MINUTES AS NEEDED FOR CHEST PAIN.  DO NOT EXCEED A TOTAL OF 3 DOSES IN 15 MINUTES   OneTouch Delica Lancets 30G MISC Use as directed twice a daily DX E11.65   oxybutynin (DITROPAN) 5 MG tablet Take 1 tablet (5 mg total) by mouth daily.   PROAIR RESPICLICK 108 (90 Base) MCG/ACT AEPB Inhale 2 puffs into the lungs every 6 (six) hours as needed.   ranolazine (RANEXA) 1000 MG SR tablet Take 1 tablet (1,000 mg total) by mouth 2 (two) times daily.   zolpidem  (AMBIEN) 5 MG tablet Take 1 tablet (5 mg total) by mouth at bedtime as needed. for sleep   No facility-administered encounter medications on file as of 01/11/2023.    Surgical History: Past Surgical History:  Procedure Laterality Date   ABDOMINAL HYSTERECTOMY     CARDIAC CATHETERIZATION     CARDIAC CATHETERIZATION N/A 10/18/2015   Procedure: Left Heart Cath and Cors/Grafts Angiography;  Surgeon: Runell Gess, MD;  Location: [facility] INVASIVE CV LAB;  Service: Cardiovascular;  Laterality: N/A;   CARDIAC CATHETERIZATION N/A 10/18/2015   Procedure: Coronary Stent Intervention;  Surgeon: Runell Gess, MD;  Location: [facility] INVASIVE CV LAB;  Service: Cardiovascular;  Laterality: N/A;   CARDIAC SURGERY     CHOLECYSTECTOMY     COLONOSCOPY WITH PROPOFOL N/A 12/02/2021   Procedure: COLONOSCOPY WITH PROPOFOL;  Surgeon: Wyline Mood, MD;  Location: [facility] ENDOSCOPY;  Service: Gastroenterology;  Laterality: N/A;   CORONARY ANGIOPLASTY     CORONARY ARTERY BYPASS GRAFT     4 vessels - 2010   CORONARY STENT INTERVENTION N/A 02/16/2017   Procedure: CORONARY STENT INTERVENTION;  Surgeon: Alwyn Pea, MD;  Location: [facility] INVASIVE CV LAB;  Service: Cardiovascular;  Laterality: N/A;   CORONARY STENT INTERVENTION N/A 02/13/2019   Procedure: CORONARY STENT INTERVENTION;  Surgeon: Yvonne Kendall, MD;  Location: [facility] INVASIVE CV LAB;  Service: Cardiovascular;  Laterality: N/A;  SVG to RCA   CORONARY STENT INTERVENTION Left 03/08/2019   Procedure: CORONARY STENT INTERVENTION;  Surgeon: Alwyn Pea, MD;  Location: [facility] INVASIVE CV LAB;  Service: Cardiovascular;  Laterality: Left;   CORONARY STENT INTERVENTION N/A 11/04/2020   Procedure: CORONARY STENT INTERVENTION;  Surgeon: Yvonne Kendall, MD;  Location: [facility] INVASIVE CV LAB;  Service: Cardiovascular;  Laterality: N/A;   CORONARY STENT INTERVENTION N/A 12/24/2021   Procedure: CORONARY STENT INTERVENTION;  Surgeon: Armando Reichert, MD;  Location: [facility]  INVASIVE CV LAB;  Service: Cardiovascular;  Laterality: N/A;   ESOPHAGOGASTRODUODENOSCOPY N/A 12/02/2021   Procedure: ESOPHAGOGASTRODUODENOSCOPY (EGD);  Surgeon: Wyline Mood, MD;  Location: [facility] ENDOSCOPY;  Service: Gastroenterology;  Laterality: N/A;   LEFT HEART CATH AND CORONARY ANGIOGRAPHY Left 02/16/2017   Procedure: LEFT HEART CATH AND CORONARY ANGIOGRAPHY;  Surgeon: Lamar Blinks, MD;  Location: [facility] INVASIVE CV LAB;  Service: Cardiovascular;  Laterality: Left;   LEFT HEART CATH AND CORONARY ANGIOGRAPHY Left 12/24/2021   Procedure: LEFT HEART CATH AND CORONARY ANGIOGRAPHY;  Surgeon: Lamar Blinks, MD;  Location: [facility] INVASIVE CV LAB;  Service: Cardiovascular;  Laterality: Left;   LEFT HEART CATH AND CORONARY ANGIOGRAPHY Left 06/17/2022   Procedure: LEFT HEART CATH AND CORONARY ANGIOGRAPHY;  Surgeon: Alwyn Pea, MD;  Location: [facility] INVASIVE CV LAB;  Service: Cardiovascular;  Laterality: Left;   LEFT HEART CATH AND CORS/GRAFTS ANGIOGRAPHY Left 11/23/2017   Procedure: LEFT HEART CATH AND CORS/GRAFTS ANGIOGRAPHY;  Surgeon: Arnoldo Hooker  J, MD;  Location: [facility] INVASIVE CV LAB;  Service: Cardiovascular;  Laterality: Left;   LEFT HEART CATH AND CORS/GRAFTS ANGIOGRAPHY N/A 02/13/2019   Procedure: LEFT HEART CATH AND CORS/GRAFTS ANGIOGRAPHY;  Surgeon: Lamar Blinks, MD;  Location: [facility] INVASIVE CV LAB;  Service: Cardiovascular;  Laterality: N/A;   LEFT HEART CATH AND CORS/GRAFTS ANGIOGRAPHY N/A 07/03/2019   Procedure: LEFT HEART CATH AND CORS/GRAFTS ANGIOGRAPHY;  Surgeon: Lamar Blinks, MD;  Location: [facility] INVASIVE CV LAB;  Service: Cardiovascular;  Laterality: N/A;   LEFT HEART CATH AND CORS/GRAFTS ANGIOGRAPHY N/A 11/04/2020   Procedure: LEFT HEART CATH AND CORS/GRAFTS ANGIOGRAPHY;  Surgeon: Lamar Blinks, MD;  Location: [facility] INVASIVE CV LAB;  Service: Cardiovascular;  Laterality: N/A;    Medical History: Past Medical History:  Diagnosis Date   Asthma    Coronary artery  disease    Diabetes mellitus without complication (HCC)    Heart attack (HCC)    Hyperlipidemia    Hypertension    MI (myocardial infarction) (HCC)    Migraine headache with aura    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    Family History: Family History  Problem Relation Age of Onset   Diabetes Mother    Diabetes Father    Cancer Father    Diabetes Brother     Social History: Social History   Socioeconomic History   Marital status: Widowed    Spouse name: Fayrene Fearing   Number of children: Not on file   Years of education: Not on file   Highest education level: Not on file  Occupational History   Not on file  Tobacco Use   Smoking status: Never   Smokeless tobacco: Never  Vaping Use   Vaping Use: Never used  Substance and Sexual Activity   Alcohol use: No    Alcohol/week: 0.0 standard drinks of alcohol   Drug use: No   Sexual activity: Yes  Other Topics Concern   Not on file  Social History Narrative   Not on file   Social Determinants of Health   Financial Resource Strain: Not on file  Food Insecurity: Not on file  Transportation Needs: Not on file  Physical Activity: Not on file  Stress: Not on file  Social Connections: Not on file  Intimate Partner Violence: Not on file    Vital Signs: Blood pressure (!) 132/90, pulse 77, temperature 98.3 F (36.8 C), resp. rate 16, height 5\' 7"  (1.702 m), weight 232 lb 9.6 oz (105.5 kg), SpO2 98 %.  Examination: General Appearance: The patient is well-developed, well-nourished, and in no distress. Skin: Gross inspection of skin unremarkable. Head: normocephalic, no gross deformities. Eyes: no gross deformities noted. ENT: ears appear grossly normal no exudates. Neck: Supple. No thyromegaly. No LAD. Respiratory: few rhonchi noted. Cardiovascular: Normal S1 and S2 without murmur or rub. Extremities: No cyanosis. pulses are equal. Neurologic: Alert and oriented. No involuntary movements.  LABS: Recent Results (from the  past 2160 hour(s))  POCT glycosylated hemoglobin (Hb A1C)     Status: Abnormal   Collection Time: 12/17/22 10:40 AM  Result Value Ref Range   Hemoglobin A1C 7.5 (A) 4.0 - 5.6 %   HbA1c POC (<> result, manual entry)     HbA1c, POC (prediabetic range)     HbA1c, POC (controlled diabetic range)    UA/M w/rflx Culture, Routine     Status: None   Collection Time: 12/17/22 12:40 PM   Specimen: Urine   Urine  Result Value Ref  Range   Specific Gravity, UA 1.015 1.005 - 1.030   pH, UA 7.5 5.0 - 7.5   Color, UA Yellow Yellow   Appearance Ur Clear Clear   Leukocytes,UA Negative Negative   Protein,UA Negative Negative/Trace   Glucose, UA Negative Negative   Ketones, UA Negative Negative   RBC, UA Negative Negative   Bilirubin, UA Negative Negative   Urobilinogen, Ur 1.0 0.2 - 1.0 mg/dL   Nitrite, UA Negative Negative   Microscopic Examination Comment     Comment: Microscopic follows if indicated.   Microscopic Examination See below:     Comment: Microscopic was indicated and was performed.   Urinalysis Reflex Comment     Comment: This specimen will not reflex to a Urine Culture.  Microscopic Examination     Status: None   Collection Time: 12/17/22 12:40 PM   Urine  Result Value Ref Range   WBC, UA None seen 0 - 5 /hpf   RBC, Urine None seen 0 - 2 /hpf   Epithelial Cells (non renal) 0-10 0 - 10 /hpf   Casts None seen None seen /lpf   Bacteria, UA None seen None seen/Few    Radiology: No results found.  No results found.  No results found.  Assessment and Plan: Patient Active Problem List   Diagnosis Date Noted   Pain in joint of left shoulder 02/23/2022   Vulvovaginal candidiasis 01/19/2022   Arthritis of right knee 01/04/2022   Pain in joint of right knee 01/04/2022   Stiffness of right shoulder joint 10/16/2021   Pain in joint of right elbow 10/01/2021   Localized osteoarthritis of knees, bilateral 06/13/2021   HLD (hyperlipidemia) 06/09/2021   Depression 06/09/2021    Chronic diastolic CHF (congestive heart failure) (HCC) 06/09/2021   Arthritis of left shoulder region 04/08/2021   Arthritis of right shoulder region 04/08/2021   Bilateral shoulder pain 04/08/2021   Leg pain 03/17/2021   Abnormal ECG 03/12/2021   Stable angina 10/29/2020   Acute non-recurrent maxillary sinusitis 08/01/2020   Cough 08/01/2020   Pain in joint of left knee 07/17/2020   Uncontrolled type 2 diabetes mellitus with hyperglycemia (HCC) 03/25/2020   Neck pain 03/25/2020   Other insomnia 03/25/2020   Obesity (BMI 30-39.9) 03/25/2020   Atherosclerosis of aorta (HCC) 03/19/2020   Pain in right knee 03/04/2020   Lumbar radiculopathy 03/04/2020   Unstable angina (HCC) 02/13/2019   Acute bilateral low back pain without sciatica 08/21/2018   Asthma 08/21/2018   Coronary artery disease involving coronary bypass graft of native heart without angina pectoris 08/21/2018   Depression, major, single episode, in partial remission (HCC) 08/21/2018   Chest pain 11/02/2017   S/P drug eluting coronary stent placement 02/16/2017   Numbness and tingling 12/01/2016   Chronic tension-type headache, intractable 12/01/2016   Osteoarthritis of knee 11/12/2016   Obesity, Class II, BMI 35-39.9 05/13/2016   Hx of CABG 2010 11/12/2015   Diabetes mellitus (HCC) 11/12/2015   Morbid obesity (HCC) 11/12/2015   SOBOE (shortness of breath on exertion) 11/12/2015   RBBB 11/12/2015   ST elevation myocardial infarction (STEMI) of inferior wall (HCC) 11/03/2015   SVG-RCA throbectomy and DES 10/18/15    Hypertensive heart disease without heart failure    Vertigo    STEMI 10/18/15 10/18/2015   NSTEMI (non-ST elevated myocardial infarction) (HCC) 10/18/2015   Intractable chronic migraine without aura and without status migrainosus 05/08/2015   Essential hypertension 03/17/2015   Acute on chronic diastolic CHF (congestive heart failure),  NYHA class 3 (HCC) 08/26/2014   CAD (coronary artery disease)  08/26/2014   Headache 08/06/2014   Mixed hyperlipidemia 07/30/2014    1. Chronic cough Concerning for intrinsic pulmonary disease we will get CT scan to evaluate the lung parenchyma however her symptoms could also be consistent with nocturnal aspiration so we will get evaluation for reflux with modified barium study. - CT Chest High Resolution; Future - SLP modified barium swallow; Future  2. GERD without esophagitis She has history of reflux speaking sleeps elevated but still has cough.  3. Obesity, morbid (HCC) Obesity Counseling: Had a lengthy discussion regarding patients BMI and weight issues. Patient was instructed on portion control as well as increased activity. Also discussed caloric restrictions with trying to maintain intake less than 2000 Kcal. Discussions were made in accordance with the 5As of weight management. Simple actions such as not eating late and if able to, taking a walk is suggested.   4. Chronic asthma, mild intermittent, uncomplicated  - Budeson-Glycopyrrol-Formoterol (BREZTRI AEROSPHERE) 160-9-4.8 MCG/ACT AERO; Inhale 2 puffs into the lungs 2 (two) times daily.  Dispense: 10.7 g; Refill: 11  5. Shortness of breath Multifactorial with history of asthma plus also has reflux may be triggering cough and shortness of breath we will get pulmonary function to evaluate for severity of intrinsic lung disease - Pulmonary function test; Future   General Counseling: I have discussed the findings of the evaluation and examination with Felicia Woods.  I have also discussed any further diagnostic evaluation thatmay be needed or ordered today. Ivis verbalizes understanding of the findings of todays visit. We also reviewed her medications today and discussed drug interactions and side effects including but not limited excessive drowsiness and altered mental states. We also discussed that there is always a risk not just to her but also people around her. she has been encouraged to call  the office with any questions or concerns that should arise related to todays visit.  No orders of the defined types were placed in this encounter.    Time spent: 46  I have personally obtained a history, examined the patient, evaluated laboratory and imaging results, formulated the assessment and plan and placed orders.    Yevonne Pax, MD [facility] Pulmonary and Critical Care Sleep medicine

## 2023-01-12 ENCOUNTER — Telehealth: Payer: Self-pay | Admitting: Internal Medicine

## 2023-01-12 ENCOUNTER — Other Ambulatory Visit: Payer: Self-pay | Admitting: Internal Medicine

## 2023-01-12 DIAGNOSIS — R053 Chronic cough: Secondary | ICD-10-CM

## 2023-01-12 DIAGNOSIS — R1310 Dysphagia, unspecified: Secondary | ICD-10-CM

## 2023-01-12 NOTE — Telephone Encounter (Signed)
Lvm notifying patient of CT and swallow appointment dates, arrival times, and locations -[place]

## 2023-01-26 ENCOUNTER — Encounter: Payer: Medicare Other | Admitting: Internal Medicine

## 2023-01-27 ENCOUNTER — Ambulatory Visit
Admission: RE | Admit: 2023-01-27 | Discharge: 2023-01-27 | Disposition: A | Discharge status: Home / Self Care | Payer: Medicare Other | Source: Ambulatory Visit | Attending: Internal Medicine | Admitting: Internal Medicine

## 2023-01-27 DIAGNOSIS — R053 Chronic cough: Secondary | ICD-10-CM | POA: Diagnosis present

## 2023-01-31 ENCOUNTER — Ambulatory Visit
Admission: RE | Admit: 2023-01-31 | Discharge: 2023-01-31 | Disposition: A | Discharge status: Home / Self Care | Payer: Medicare Other | Source: Ambulatory Visit | Attending: Internal Medicine | Admitting: Internal Medicine

## 2023-01-31 DIAGNOSIS — R053 Chronic cough: Secondary | ICD-10-CM | POA: Insufficient documentation

## 2023-01-31 DIAGNOSIS — R1310 Dysphagia, unspecified: Secondary | ICD-10-CM | POA: Diagnosis present

## 2023-01-31 NOTE — Therapy (Signed)
Modified Barium Swallow Study  Patient Details  Name: Felicia Woods MRN: [id number] Date of Birth: [birth year]  Today's Date: 01/31/2023  Modified Barium Swallow completed.  Full report located under Chart Review in the Imaging Section.  History of Present Illness Pt is a 67 yo female presented for MBSS in setting of chronic cough. Pt denies s/sx oropharyngeal or pharyngoesophageal dysphagia. Pt with multiple comorbidities including, but not limited to, GERD, depression, asthma, and DM.   Clinical Impression Pt presents with a functional oropharyngeal swallow. Esophageal screening in A-P position, although non-diagnostic, was unremarkable. No f/u SLP services recommended at this time. Factors that may increase risk of adverse event in presence of aspiration Felicia Woods & Felicia Woods 2021): Respiratory or GI disease  Swallow Evaluation Recommendations Recommendations: PO diet PO Diet Recommendation: Regular;Thin liquids (Level 0) Liquid Administration via: Spoon;Cup;Straw Medication Administration: Other (Comment) (as tolerated) Supervision: Patient able to self-feed Recommended consults: Other(comment) (Pulmonology for chronic cough)     Felicia Woods, M.S., CCC-SLP Speech-Language Pathologist [facility] [phone] (ASCOM)   Felicia Woods Felicia Woods 01/31/2023,1:36 PM

## 2023-02-08 ENCOUNTER — Telehealth: Payer: Self-pay

## 2023-02-08 ENCOUNTER — Other Ambulatory Visit: Payer: Self-pay | Admitting: Physician Assistant

## 2023-02-08 ENCOUNTER — Ambulatory Visit: Payer: Medicare Other | Admitting: Internal Medicine

## 2023-02-08 MED ORDER — FLUCONAZOLE 150 MG PO TABS: 150.0000 mg | ORAL_TABLET | Freq: Once | ORAL | 0 refills | Status: AC

## 2023-02-08 NOTE — Telephone Encounter (Signed)
Pt.notified

## 2023-02-09 ENCOUNTER — Encounter: Payer: Medicare Other | Admitting: Internal Medicine

## 2023-02-14 ENCOUNTER — Other Ambulatory Visit: Payer: Self-pay

## 2023-02-14 ENCOUNTER — Telehealth: Payer: Self-pay

## 2023-02-14 MED ORDER — NYSTATIN 100000 UNIT/ML MT SUSP: 5.0000 mL | Freq: Every day | OROMUCOSAL | 0 refills | Status: DC

## 2023-02-14 NOTE — Telephone Encounter (Signed)
Pt called that she had thrush as per lauren sent nystatin suspension

## 2023-02-23 ENCOUNTER — Encounter: Payer: Medicare Other | Admitting: Internal Medicine

## 2023-03-08 ENCOUNTER — Ambulatory Visit: Payer: Medicare Other | Admitting: Internal Medicine

## 2023-03-09 ENCOUNTER — Encounter: Payer: Medicare Other | Admitting: Internal Medicine

## 2023-03-11 ENCOUNTER — Telehealth: Payer: Self-pay

## 2023-03-11 ENCOUNTER — Other Ambulatory Visit: Payer: Self-pay | Admitting: Physician Assistant

## 2023-03-11 DIAGNOSIS — F4321 Adjustment disorder with depressed mood: Secondary | ICD-10-CM

## 2023-03-11 MED ORDER — ESCITALOPRAM OXALATE 5 MG PO TABS: 5.0000 mg | ORAL_TABLET | Freq: Every day | ORAL | 2 refills | Status: DC

## 2023-03-11 NOTE — Telephone Encounter (Signed)
Pt advised we send med  

## 2023-03-17 ENCOUNTER — Ambulatory Visit: Payer: Medicare Other | Admitting: Physician Assistant

## 2023-03-23 ENCOUNTER — Ambulatory Visit (INDEPENDENT_AMBULATORY_CARE_PROVIDER_SITE_OTHER): Payer: Medicare Other | Admitting: Internal Medicine

## 2023-03-23 DIAGNOSIS — R0602 Shortness of breath: Secondary | ICD-10-CM

## 2023-04-05 ENCOUNTER — Ambulatory Visit: Payer: Medicare Other | Admitting: Internal Medicine

## 2023-04-14 ENCOUNTER — Encounter: Payer: Self-pay | Admitting: Physician Assistant

## 2023-04-14 ENCOUNTER — Ambulatory Visit: Payer: Medicare Other | Admitting: Physician Assistant

## 2023-04-14 VITALS — BP 115/85 | HR 77 | Temp 98.0°F | Resp 16 | Ht 67.0 in | Wt 241.6 lb

## 2023-04-14 DIAGNOSIS — E538 Deficiency of other specified B group vitamins: Secondary | ICD-10-CM

## 2023-04-14 DIAGNOSIS — E1159 Type 2 diabetes mellitus with other circulatory complications: Secondary | ICD-10-CM | POA: Diagnosis not present

## 2023-04-14 DIAGNOSIS — J4520 Mild intermittent asthma, uncomplicated: Secondary | ICD-10-CM

## 2023-04-14 DIAGNOSIS — R7989 Other specified abnormal findings of blood chemistry: Secondary | ICD-10-CM

## 2023-04-14 DIAGNOSIS — I10 Essential (primary) hypertension: Secondary | ICD-10-CM

## 2023-04-14 DIAGNOSIS — G4709 Other insomnia: Secondary | ICD-10-CM | POA: Diagnosis not present

## 2023-04-14 DIAGNOSIS — Z634 Disappearance and death of family member: Secondary | ICD-10-CM

## 2023-04-14 DIAGNOSIS — R5383 Other fatigue: Secondary | ICD-10-CM

## 2023-04-14 DIAGNOSIS — F4321 Adjustment disorder with depressed mood: Secondary | ICD-10-CM

## 2023-04-14 DIAGNOSIS — E782 Mixed hyperlipidemia: Secondary | ICD-10-CM

## 2023-04-14 DIAGNOSIS — E559 Vitamin D deficiency, unspecified: Secondary | ICD-10-CM

## 2023-04-14 MED ORDER — ESCITALOPRAM OXALATE 10 MG PO TABS: 10.0000 mg | ORAL_TABLET | Freq: Every day | ORAL | 2 refills | Status: DC

## 2023-04-14 MED ORDER — ZOLPIDEM TARTRATE 5 MG PO TABS: 5.0000 mg | ORAL_TABLET | Freq: Every evening | ORAL | 2 refills | Status: DC | PRN

## 2023-04-14 MED ORDER — BREZTRI AEROSPHERE 160-9-4.8 MCG/ACT IN AERO: 2.0000 | INHALATION_SPRAY | Freq: Two times a day (BID) | RESPIRATORY_TRACT | 11 refills | Status: AC

## 2023-04-14 NOTE — Progress Notes (Signed)
[facility] [address] [place], [place] [address]  Internal MEDICINE  Office Visit Note  Patient Name: Felicia Woods  [id number]  [id number]  Date of Service: 04/20/2023  Chief Complaint  Patient presents with   Follow-up   Diabetes   Hypertension   Hyperlipidemia   Quality Metric Gaps    Dexa Scan    HPI Pt is here for routine follow up -Markus Daft is working well for her, missed pulm visit and will reschedule -going to be having right knee replacement around Nov 18th and will likely be recovering through march -lexapro is helping some days but could be better and would like to increase dose. Still grieving loss of her child -shingles vaccine first dose done, and will go for second and notify office of dates -will order labs for CPE  Current Medication: Outpatient Encounter Medications as of 04/14/2023  Medication Sig   amLODipine (NORVASC) 10 MG tablet Take 1 tablet by mouth daily.   aspirin EC 81 MG tablet Take 81 mg by mouth daily.   atorvastatin (LIPITOR) 40 MG tablet Take 1 tablet (40 mg total) by mouth daily.   azithromycin (ZITHROMAX) 250 MG tablet Take one tab a day for 10 days for uri   benzonatate (TESSALON) 200 MG capsule Take 1 capsule (200 mg total) by mouth 2 (two) times daily as needed for cough.   Blood Glucose Monitoring Suppl (ONETOUCH VERIO FLEX SYSTEM) w/Device KIT Use as directed twice a daily DX E11.65   clopidogrel (PLAVIX) 75 MG tablet Take 1 tablet by mouth daily.   cyclobenzaprine (FLEXERIL) 10 MG tablet Take 1 tablet by mouth at bedtime.   Dulaglutide (TRULICITY) 3 MG/0.5ML SOPN Inject 3 mg as directed once a week.   empagliflozin (JARDIANCE) 25 MG TABS tablet Take one tab a day for diabetes   escitalopram (LEXAPRO) 10 MG tablet Take 1 tablet (10 mg total) by mouth daily.   famotidine (PEPCID) 20 MG tablet Take 1 tablet (20 mg total) by mouth 2 (two) times daily.   furosemide (LASIX) 40 MG tablet Take 1 tablet (40 mg total) by mouth  2 (two) times daily.   glucose blood (ONETOUCH VERIO) test strip Use as instructed twice daily DX E11.65   isosorbide mononitrate (IMDUR) 120 MG 24 hr tablet Take 1 tablet by mouth daily.   losartan (COZAAR) 100 MG tablet Take 1 tablet (100 mg total) by mouth daily.   metoprolol succinate (TOPROL-XL) 100 MG 24 hr tablet Take 100 mg by mouth daily.   Misc. Devices (ADJUST BATH/SHOWER SEAT) MISC 1 shower seat for use at home to reduce risk of falling.   montelukast (SINGULAIR) 10 MG tablet Take 1 tablet (10 mg total) by mouth at bedtime.   nitroGLYCERIN (NITRODUR - DOSED IN MG/24 HR) 0.1 mg/hr patch Place 0.1 mg onto the skin daily.   nitroGLYCERIN (NITROSTAT) 0.4 MG SL tablet DISSOLVE ONE TABLET UNDER THE TONGUE EVERY 5 MINUTES AS NEEDED FOR CHEST PAIN.  DO NOT EXCEED A TOTAL OF 3 DOSES IN 15 MINUTES   nystatin (MYCOSTATIN) 100000 UNIT/ML suspension Take 5 mLs (500,000 Units total) by mouth daily. Swish and spit   OneTouch Delica Lancets 30G MISC Use as directed twice a daily DX E11.65   oxybutynin (DITROPAN) 5 MG tablet Take 1 tablet (5 mg total) by mouth daily.   PROAIR RESPICLICK 108 (90 Base) MCG/ACT AEPB Inhale 2 puffs into the lungs every 6 (six) hours as needed.   ranolazine (RANEXA) 1000 MG SR tablet Take  1 tablet (1,000 mg total) by mouth 2 (two) times daily.   [DISCONTINUED] Budeson-Glycopyrrol-Formoterol (BREZTRI AEROSPHERE) 160-9-4.8 MCG/ACT AERO Inhale 2 puffs into the lungs 2 (two) times daily.   [DISCONTINUED] escitalopram (LEXAPRO) 5 MG tablet Take 1 tablet (5 mg total) by mouth daily.   [DISCONTINUED] zolpidem (AMBIEN) 5 MG tablet Take 1 tablet (5 mg total) by mouth at bedtime as needed. for sleep   Budeson-Glycopyrrol-Formoterol (BREZTRI AEROSPHERE) 160-9-4.8 MCG/ACT AERO Inhale 2 puffs into the lungs 2 (two) times daily.   zolpidem (AMBIEN) 5 MG tablet Take 1 tablet (5 mg total) by mouth at bedtime as needed. for sleep   No facility-administered encounter medications on file as  of 04/14/2023.    Surgical History: Past Surgical History:  Procedure Laterality Date   ABDOMINAL HYSTERECTOMY     CARDIAC CATHETERIZATION     CARDIAC CATHETERIZATION N/A 10/18/2015   Procedure: Left Heart Cath and Cors/Grafts Angiography;  Surgeon: Runell Gess, MD;  Location: [facility] INVASIVE CV LAB;  Service: Cardiovascular;  Laterality: N/A;   CARDIAC CATHETERIZATION N/A 10/18/2015   Procedure: Coronary Stent Intervention;  Surgeon: Runell Gess, MD;  Location: [facility] INVASIVE CV LAB;  Service: Cardiovascular;  Laterality: N/A;   CARDIAC SURGERY     CHOLECYSTECTOMY     COLONOSCOPY WITH PROPOFOL N/A 12/02/2021   Procedure: COLONOSCOPY WITH PROPOFOL;  Surgeon: Wyline Mood, MD;  Location: [facility] ENDOSCOPY;  Service: Gastroenterology;  Laterality: N/A;   CORONARY ANGIOPLASTY     CORONARY ARTERY BYPASS GRAFT     4 vessels - 2010   CORONARY STENT INTERVENTION N/A 02/16/2017   Procedure: CORONARY STENT INTERVENTION;  Surgeon: Alwyn Pea, MD;  Location: [facility] INVASIVE CV LAB;  Service: Cardiovascular;  Laterality: N/A;   CORONARY STENT INTERVENTION N/A 02/13/2019   Procedure: CORONARY STENT INTERVENTION;  Surgeon: Yvonne Kendall, MD;  Location: [facility] INVASIVE CV LAB;  Service: Cardiovascular;  Laterality: N/A;  SVG to RCA   CORONARY STENT INTERVENTION Left 03/08/2019   Procedure: CORONARY STENT INTERVENTION;  Surgeon: Alwyn Pea, MD;  Location: [facility] INVASIVE CV LAB;  Service: Cardiovascular;  Laterality: Left;   CORONARY STENT INTERVENTION N/A 11/04/2020   Procedure: CORONARY STENT INTERVENTION;  Surgeon: Yvonne Kendall, MD;  Location: [facility] INVASIVE CV LAB;  Service: Cardiovascular;  Laterality: N/A;   CORONARY STENT INTERVENTION N/A 12/24/2021   Procedure: CORONARY STENT INTERVENTION;  Surgeon: Armando Reichert, MD;  Location: [facility] INVASIVE CV LAB;  Service: Cardiovascular;  Laterality: N/A;   ESOPHAGOGASTRODUODENOSCOPY N/A 12/02/2021   Procedure: ESOPHAGOGASTRODUODENOSCOPY (EGD);   Surgeon: Wyline Mood, MD;  Location: [facility] ENDOSCOPY;  Service: Gastroenterology;  Laterality: N/A;   LEFT HEART CATH AND CORONARY ANGIOGRAPHY Left 02/16/2017   Procedure: LEFT HEART CATH AND CORONARY ANGIOGRAPHY;  Surgeon: Lamar Blinks, MD;  Location: [facility] INVASIVE CV LAB;  Service: Cardiovascular;  Laterality: Left;   LEFT HEART CATH AND CORONARY ANGIOGRAPHY Left 12/24/2021   Procedure: LEFT HEART CATH AND CORONARY ANGIOGRAPHY;  Surgeon: Lamar Blinks, MD;  Location: [facility] INVASIVE CV LAB;  Service: Cardiovascular;  Laterality: Left;   LEFT HEART CATH AND CORONARY ANGIOGRAPHY Left 06/17/2022   Procedure: LEFT HEART CATH AND CORONARY ANGIOGRAPHY;  Surgeon: Alwyn Pea, MD;  Location: [facility] INVASIVE CV LAB;  Service: Cardiovascular;  Laterality: Left;   LEFT HEART CATH AND CORS/GRAFTS ANGIOGRAPHY Left 11/23/2017   Procedure: LEFT HEART CATH AND CORS/GRAFTS ANGIOGRAPHY;  Surgeon: Lamar Blinks, MD;  Location: [facility] INVASIVE CV LAB;  Service: Cardiovascular;  Laterality: Left;   LEFT  HEART CATH AND CORS/GRAFTS ANGIOGRAPHY N/A 02/13/2019   Procedure: LEFT HEART CATH AND CORS/GRAFTS ANGIOGRAPHY;  Surgeon: Lamar Blinks, MD;  Location: [facility] INVASIVE CV LAB;  Service: Cardiovascular;  Laterality: N/A;   LEFT HEART CATH AND CORS/GRAFTS ANGIOGRAPHY N/A 07/03/2019   Procedure: LEFT HEART CATH AND CORS/GRAFTS ANGIOGRAPHY;  Surgeon: Lamar Blinks, MD;  Location: [facility] INVASIVE CV LAB;  Service: Cardiovascular;  Laterality: N/A;   LEFT HEART CATH AND CORS/GRAFTS ANGIOGRAPHY N/A 11/04/2020   Procedure: LEFT HEART CATH AND CORS/GRAFTS ANGIOGRAPHY;  Surgeon: Lamar Blinks, MD;  Location: [facility] INVASIVE CV LAB;  Service: Cardiovascular;  Laterality: N/A;    Medical History: Past Medical History:  Diagnosis Date   Asthma    Coronary artery disease    Diabetes mellitus without complication (HCC)    Heart attack (HCC)    Hyperlipidemia    Hypertension    MI (myocardial infarction) (HCC)     Migraine headache with aura    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    Family History: Family History  Problem Relation Age of Onset   Diabetes Mother    Diabetes Father    Cancer Father    Diabetes Brother     Social History   Socioeconomic History   Marital status: Widowed    Spouse name: Fayrene Fearing   Number of children: Not on file   Years of education: Not on file   Highest education level: Not on file  Occupational History   Not on file  Tobacco Use   Smoking status: Never   Smokeless tobacco: Never  Vaping Use   Vaping status: Never Used  Substance and Sexual Activity   Alcohol use: No    Alcohol/week: 0.0 standard drinks of alcohol   Drug use: No   Sexual activity: Yes  Other Topics Concern   Not on file  Social History Narrative   Not on file   Social Determinants of Health   Financial Resource Strain: Not on file  Food Insecurity: Not on file  Transportation Needs: Not on file  Physical Activity: Not on file  Stress: Not on file  Social Connections: Unknown (11/17/2021)   Received from [facility] [place] [facility]   Social Network    Social Network: Not on file  Intimate Partner Violence: Unknown (10/09/2021)   [facility] from [facility]   HITS    Physically Hurt: Not on file    Insult or Talk Down To: Not on file    Threaten Physical Harm: Not on file    Scream or Curse: Not on file      Review of Systems  Constitutional:  Negative for chills, fatigue and unexpected weight change.  HENT:  Negative for congestion, postnasal drip, rhinorrhea, sneezing and sore throat.   Eyes:  Negative for redness.  Respiratory:  Negative for chest tightness.   Cardiovascular:  Negative for chest pain and palpitations.  Gastrointestinal:  Negative for abdominal pain, constipation, diarrhea, nausea and vomiting.  Genitourinary:  Negative for dysuria and frequency.  Musculoskeletal:  Negative for arthralgias, back pain, joint swelling and neck pain.   Skin:  Negative for rash.  Neurological: Negative.  Negative for tremors and numbness.  Hematological:  Negative for adenopathy. Does not bruise/bleed easily.  Psychiatric/Behavioral:  Positive for behavioral problems (Depression) and sleep disturbance. Negative for suicidal ideas. The patient is not nervous/anxious.     Vital Signs: BP 115/85   Pulse 77   Temp 98 F (36.7 C)  Resp 16   Ht 5\' 7"  (1.702 m)   Wt 241 lb 9.6 oz (109.6 kg)   SpO2 98%   BMI 37.84 kg/m    Physical Exam Vitals and nursing note reviewed.  Constitutional:      Appearance: Normal appearance. She is obese.  HENT:     Head: Normocephalic and atraumatic.     Mouth/Throat:     Mouth: Mucous membranes are moist.     Pharynx: No posterior oropharyngeal erythema.  Eyes:     Extraocular Movements: Extraocular movements intact.     Pupils: Pupils are equal, round, and reactive to light.  Cardiovascular:     Rate and Rhythm: Normal rate and regular rhythm.     Pulses: Normal pulses.     Heart sounds: Normal heart sounds.  Pulmonary:     Effort: Pulmonary effort is normal.     Breath sounds: Normal breath sounds.  Skin:    General: Skin is warm and dry.  Neurological:     General: No focal deficit present.     Mental Status: She is alert.  Psychiatric:        Mood and Affect: Mood normal.        Behavior: Behavior normal.        Assessment/Plan: 1. Type 2 diabetes mellitus with other circulatory complication, without long-term current use of insulin (HCC) Will check labs, continue on current medications and working on diet and exercise - Urine Microalbumin w/creat. ratio - Hgb A1C w/o eAG  2. Essential hypertension Stable, continue current medications  3. Grief at loss of child Improving, Will increase lexapro to 10mg  daily  4. Other insomnia - zolpidem (AMBIEN) 5 MG tablet; Take 1 tablet (5 mg total) by mouth at bedtime as needed. for sleep  Dispense: 30 tablet; Refill: 2  5. Chronic  asthma, mild intermittent, uncomplicated - Budeson-Glycopyrrol-Formoterol (BREZTRI AEROSPHERE) 160-9-4.8 MCG/ACT AERO; Inhale 2 puffs into the lungs 2 (two) times daily.  Dispense: 10.7 g; Refill: 11  6. Mixed hyperlipidemia - Lipid Panel With LDL/HDL Ratio  7. Vitamin D deficiency - VITAMIN D 25 Hydroxy (Vit-D Deficiency, Fractures)  8. B12 deficiency - B12 and Folate Panel  9. Abnormal thyroid blood test - TSH + free T4  10. Other fatigue - CBC w/Diff/Platelet - Comprehensive metabolic panel - TSH + free T4 - Hgb A1C w/o eAG - Lipid Panel With LDL/HDL Ratio - B12 and Folate Panel - VITAMIN D 25 Hydroxy (Vit-D Deficiency, Fractures) - Fe+TIBC+Fer   General Counseling: Darrion verbalizes understanding of the findings of todays visit and agrees with plan of treatment. I have discussed any further diagnostic evaluation that may be needed or ordered today. We also reviewed her medications today. she has been encouraged to call the office with any questions or concerns that should arise related to todays visit.    Orders Placed This Encounter  Procedures   HM DEXA SCAN   Urine Microalbumin w/creat. ratio   CBC w/Diff/Platelet   Comprehensive metabolic panel   TSH + free T4   Hgb A1C w/o eAG   Lipid Panel With LDL/HDL Ratio   B12 and Folate Panel   VITAMIN D 25 Hydroxy (Vit-D Deficiency, Fractures)   Fe+TIBC+Fer    Meds ordered this encounter  Medications   zolpidem (AMBIEN) 5 MG tablet    Sig: Take 1 tablet (5 mg total) by mouth at bedtime as needed. for sleep    Dispense:  30 tablet    Refill:  2  escitalopram (LEXAPRO) 10 MG tablet    Sig: Take 1 tablet (10 mg total) by mouth daily.    Dispense:  30 tablet    Refill:  2   Budeson-Glycopyrrol-Formoterol (BREZTRI AEROSPHERE) 160-9-4.8 MCG/ACT AERO    Sig: Inhale 2 puffs into the lungs 2 (two) times daily.    Dispense:  10.7 g    Refill:  11    This patient was seen by Lynn Ito, PA-C in collaboration  with Dr. Beverely Risen as a part of collaborative care agreement.   Total time spent:30 Minutes Time spent includes review of chart, medications, test results, and follow up plan with the patient.      Dr Lyndon Code Internal medicine

## 2023-04-15 LAB — MICROALBUMIN / CREATININE URINE RATIO
Creatinine, Urine: 113.9 mg/dL
Microalb/Creat Ratio: <3 mg/g{creat} (ref 0–29)
Microalbumin, Urine: <3 ug/mL

## 2023-04-19 ENCOUNTER — Ambulatory Visit: Payer: Medicare Other | Admitting: Internal Medicine

## 2023-05-01 NOTE — Procedures (Signed)
[facility] [address] [place], [address]    Complete Pulmonary Function Testing Interpretation:  FINDINGS:  The forced vital capacity is moderately decreased.  FEV1 is 1.44 L which is 65% predicted and is mildly decreased.  FEV1 FVC ratio was normal.  Postbronchodilator there is no significant change in FEV1.  Total lung capacity is moderately decreased.  Residual volume is decreased.  FRC is decreased.  The residual volume total lung capacity ratio is increased.  DLCO is normal.  IMPRESSION:  This pulmonary function study is consistent with moderate restrictive lung disease clinical correlation is recommended.  Yevonne Pax, MD [facility] Pulmonary Critical Care Medicine Sleep Medicine

## 2023-05-08 ENCOUNTER — Other Ambulatory Visit: Payer: Self-pay | Admitting: Physician Assistant

## 2023-05-11 ENCOUNTER — Other Ambulatory Visit: Payer: Self-pay | Admitting: Physician Assistant

## 2023-05-12 ENCOUNTER — Encounter: Payer: Self-pay | Admitting: Physician Assistant

## 2023-05-12 ENCOUNTER — Ambulatory Visit (INDEPENDENT_AMBULATORY_CARE_PROVIDER_SITE_OTHER): Payer: Medicare Other | Admitting: Physician Assistant

## 2023-05-12 VITALS — BP 128/90 | HR 80 | Temp 98.3°F | Resp 16 | Ht 67.0 in | Wt 237.6 lb

## 2023-05-12 DIAGNOSIS — Z634 Disappearance and death of family member: Secondary | ICD-10-CM

## 2023-05-12 DIAGNOSIS — F4321 Adjustment disorder with depressed mood: Secondary | ICD-10-CM

## 2023-05-12 DIAGNOSIS — I10 Essential (primary) hypertension: Secondary | ICD-10-CM | POA: Diagnosis not present

## 2023-05-12 NOTE — Progress Notes (Signed)
[facility] [address] [place], [place] [address]  Internal MEDICINE  Office Visit Note  Patient Name: Felicia Woods  [id number]  [id number]  Date of Service: 05/18/2023  Chief Complaint  Patient presents with   Follow-up   Diabetes   Hypertension   Hyperlipidemia    HPI Pt is here for routine follow up -Doing better on lexapro and would like to continue with current dose -Has diabetic eye exam on 11/14 -On dec 5th has right knee replacement -BP did not improve on recheck, likely due to talking about autopsy report from her son's death.   Current Medication: Outpatient Encounter Medications as of 05/12/2023  Medication Sig   amLODipine (NORVASC) 10 MG tablet Take 1 tablet by mouth daily.   aspirin EC 81 MG tablet Take 81 mg by mouth daily.   atorvastatin (LIPITOR) 40 MG tablet Take 1 tablet (40 mg total) by mouth daily.   azithromycin (ZITHROMAX) 250 MG tablet Take one tab a day for 10 days for uri   benzonatate (TESSALON) 200 MG capsule Take 1 capsule (200 mg total) by mouth 2 (two) times daily as needed for cough.   Blood Glucose Monitoring Suppl (ONETOUCH VERIO FLEX SYSTEM) w/Device KIT Use as directed twice a daily DX E11.65   Budeson-Glycopyrrol-Formoterol (BREZTRI AEROSPHERE) 160-9-4.8 MCG/ACT AERO Inhale 2 puffs into the lungs 2 (two) times daily.   clopidogrel (PLAVIX) 75 MG tablet Take 1 tablet by mouth daily.   cyclobenzaprine (FLEXERIL) 10 MG tablet Take 1 tablet by mouth at bedtime.   Dulaglutide (TRULICITY) 3 MG/0.5ML SOPN Inject 3 mg as directed once a week.   empagliflozin (JARDIANCE) 25 MG TABS tablet Take one tab a day for diabetes   escitalopram (LEXAPRO) 10 MG tablet Take 1 tablet (10 mg total) by mouth daily.   famotidine (PEPCID) 20 MG tablet Take 1 tablet (20 mg total) by mouth 2 (two) times daily.   furosemide (LASIX) 40 MG tablet Take 1 tablet (40 mg total) by mouth 2 (two) times daily.   glucose blood (ONETOUCH VERIO) test strip Use as  instructed twice daily DX E11.65   isosorbide mononitrate (IMDUR) 120 MG 24 hr tablet Take 1 tablet by mouth daily.   losartan (COZAAR) 100 MG tablet Take 1 tablet (100 mg total) by mouth daily.   metoprolol succinate (TOPROL-XL) 100 MG 24 hr tablet Take 100 mg by mouth daily.   Misc. Devices (ADJUST BATH/SHOWER SEAT) MISC 1 shower seat for use at home to reduce risk of falling.   montelukast (SINGULAIR) 10 MG tablet Take 1 tablet (10 mg total) by mouth at bedtime.   nitroGLYCERIN (NITRODUR - DOSED IN MG/24 HR) 0.1 mg/hr patch Place 0.1 mg onto the skin daily.   nitroGLYCERIN (NITROSTAT) 0.4 MG SL tablet DISSOLVE ONE TABLET UNDER THE TONGUE EVERY 5 MINUTES AS NEEDED FOR CHEST PAIN.  DO NOT EXCEED A TOTAL OF 3 DOSES IN 15 MINUTES   nystatin (MYCOSTATIN) 100000 UNIT/ML suspension Take 5 mLs (500,000 Units total) by mouth daily. Swish and spit   OneTouch Delica Lancets 30G MISC USE 1  TO CHECK GLUCOSE TWICE DAILY AS DIRECTED   oxybutynin (DITROPAN) 5 MG tablet Take 1 tablet by mouth once daily   PROAIR RESPICLICK 108 (90 Base) MCG/ACT AEPB Inhale 2 puffs into the lungs every 6 (six) hours as needed.   ranolazine (RANEXA) 1000 MG SR tablet Take 1 tablet (1,000 mg total) by mouth 2 (two) times daily.   zolpidem (AMBIEN) 5 MG tablet Take  1 tablet (5 mg total) by mouth at bedtime as needed. for sleep   No facility-administered encounter medications on file as of 05/12/2023.    Surgical History: Past Surgical History:  Procedure Laterality Date   ABDOMINAL HYSTERECTOMY     CARDIAC CATHETERIZATION     CARDIAC CATHETERIZATION N/A 10/18/2015   Procedure: Left Heart Cath and Cors/Grafts Angiography;  Surgeon: Runell Gess, MD;  Location: [facility] INVASIVE CV LAB;  Service: Cardiovascular;  Laterality: N/A;   CARDIAC CATHETERIZATION N/A 10/18/2015   Procedure: Coronary Stent Intervention;  Surgeon: Runell Gess, MD;  Location: [facility] INVASIVE CV LAB;  Service: Cardiovascular;  Laterality: N/A;   CARDIAC  SURGERY     CHOLECYSTECTOMY     COLONOSCOPY WITH PROPOFOL N/A 12/02/2021   Procedure: COLONOSCOPY WITH PROPOFOL;  Surgeon: Wyline Mood, MD;  Location: [facility] ENDOSCOPY;  Service: Gastroenterology;  Laterality: N/A;   CORONARY ANGIOPLASTY     CORONARY ARTERY BYPASS GRAFT     4 vessels - 2010   CORONARY STENT INTERVENTION N/A 02/16/2017   Procedure: CORONARY STENT INTERVENTION;  Surgeon: Alwyn Pea, MD;  Location: [facility] INVASIVE CV LAB;  Service: Cardiovascular;  Laterality: N/A;   CORONARY STENT INTERVENTION N/A 02/13/2019   Procedure: CORONARY STENT INTERVENTION;  Surgeon: Yvonne Kendall, MD;  Location: [facility] INVASIVE CV LAB;  Service: Cardiovascular;  Laterality: N/A;  SVG to RCA   CORONARY STENT INTERVENTION Left 03/08/2019   Procedure: CORONARY STENT INTERVENTION;  Surgeon: Alwyn Pea, MD;  Location: [facility] INVASIVE CV LAB;  Service: Cardiovascular;  Laterality: Left;   CORONARY STENT INTERVENTION N/A 11/04/2020   Procedure: CORONARY STENT INTERVENTION;  Surgeon: Yvonne Kendall, MD;  Location: [facility] INVASIVE CV LAB;  Service: Cardiovascular;  Laterality: N/A;   CORONARY STENT INTERVENTION N/A 12/24/2021   Procedure: CORONARY STENT INTERVENTION;  Surgeon: Armando Reichert, MD;  Location: [facility] INVASIVE CV LAB;  Service: Cardiovascular;  Laterality: N/A;   ESOPHAGOGASTRODUODENOSCOPY N/A 12/02/2021   Procedure: ESOPHAGOGASTRODUODENOSCOPY (EGD);  Surgeon: Wyline Mood, MD;  Location: [facility] ENDOSCOPY;  Service: Gastroenterology;  Laterality: N/A;   LEFT HEART CATH AND CORONARY ANGIOGRAPHY Left 02/16/2017   Procedure: LEFT HEART CATH AND CORONARY ANGIOGRAPHY;  Surgeon: Lamar Blinks, MD;  Location: [facility] INVASIVE CV LAB;  Service: Cardiovascular;  Laterality: Left;   LEFT HEART CATH AND CORONARY ANGIOGRAPHY Left 12/24/2021   Procedure: LEFT HEART CATH AND CORONARY ANGIOGRAPHY;  Surgeon: Lamar Blinks, MD;  Location: [facility] INVASIVE CV LAB;  Service: Cardiovascular;  Laterality: Left;    LEFT HEART CATH AND CORONARY ANGIOGRAPHY Left 06/17/2022   Procedure: LEFT HEART CATH AND CORONARY ANGIOGRAPHY;  Surgeon: Alwyn Pea, MD;  Location: [facility] INVASIVE CV LAB;  Service: Cardiovascular;  Laterality: Left;   LEFT HEART CATH AND CORS/GRAFTS ANGIOGRAPHY Left 11/23/2017   Procedure: LEFT HEART CATH AND CORS/GRAFTS ANGIOGRAPHY;  Surgeon: Lamar Blinks, MD;  Location: [facility] INVASIVE CV LAB;  Service: Cardiovascular;  Laterality: Left;   LEFT HEART CATH AND CORS/GRAFTS ANGIOGRAPHY N/A 02/13/2019   Procedure: LEFT HEART CATH AND CORS/GRAFTS ANGIOGRAPHY;  Surgeon: Lamar Blinks, MD;  Location: [facility] INVASIVE CV LAB;  Service: Cardiovascular;  Laterality: N/A;   LEFT HEART CATH AND CORS/GRAFTS ANGIOGRAPHY N/A 07/03/2019   Procedure: LEFT HEART CATH AND CORS/GRAFTS ANGIOGRAPHY;  Surgeon: Lamar Blinks, MD;  Location: [facility] INVASIVE CV LAB;  Service: Cardiovascular;  Laterality: N/A;   LEFT HEART CATH AND CORS/GRAFTS ANGIOGRAPHY N/A 11/04/2020   Procedure: LEFT HEART CATH AND CORS/GRAFTS ANGIOGRAPHY;  Surgeon: Lamar Blinks,  MD;  Location: [facility] INVASIVE CV LAB;  Service: Cardiovascular;  Laterality: N/A;    Medical History: Past Medical History:  Diagnosis Date   Asthma    Coronary artery disease    Diabetes mellitus without complication (HCC)    Heart attack (HCC)    Hyperlipidemia    Hypertension    MI (myocardial infarction) (HCC)    Migraine headache with aura    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    Family History: Family History  Problem Relation Age of Onset   Diabetes Mother    Diabetes Father    Cancer Father    Diabetes Brother     Social History   Socioeconomic History   Marital status: Widowed    Spouse name: Fayrene Fearing   Number of children: Not on file   Years of education: Not on file   Highest education level: Not on file  Occupational History   Not on file  Tobacco Use   Smoking status: Never   Smokeless tobacco: Never  Vaping Use   Vaping  status: Never Used  Substance and Sexual Activity   Alcohol use: No    Alcohol/week: 0.0 standard drinks of alcohol   Drug use: No   Sexual activity: Yes  Other Topics Concern   Not on file  Social History Narrative   Not on file   Social Determinants of Health   Financial Resource Strain: Not on file  Food Insecurity: Not on file  Transportation Needs: Not on file  Physical Activity: Not on file  Stress: Not on file  Social Connections: Unknown (11/17/2021)   Received from [facility]   Social Network    Social Network: Not on file  Intimate Partner Violence: Unknown (10/09/2021)   [facility]   HITS    Physically Hurt: Not on file    Insult or Talk Down To: Not on file    Threaten Physical Harm: Not on file    Scream or Curse: Not on file      Review of Systems  Constitutional:  Negative for chills, fatigue and unexpected weight change.  HENT:  Negative for congestion, postnasal drip, rhinorrhea, sneezing and sore throat.   Eyes:  Negative for redness.  Respiratory:  Negative for chest tightness.   Cardiovascular:  Negative for chest pain and palpitations.  Gastrointestinal:  Negative for abdominal pain, constipation, diarrhea, nausea and vomiting.  Genitourinary:  Negative for dysuria and frequency.  Musculoskeletal:  Positive for arthralgias. Negative for back pain, joint swelling and neck pain.  Skin:  Negative for rash.  Neurological: Negative.  Negative for tremors and numbness.  Hematological:  Negative for adenopathy. Does not bruise/bleed easily.  Psychiatric/Behavioral:  Positive for behavioral problems (Depression) and sleep disturbance. Negative for suicidal ideas. The patient is not nervous/anxious.     Vital Signs: BP (!) 128/90   Pulse 80   Temp 98.3 F (36.8 C)   Resp 16   Ht 5\' 7"  (1.702 m)   Wt 237 lb 9.6 oz (107.8 kg)   SpO2 97%   BMI 37.21 kg/m    Physical Exam Vitals and nursing note  reviewed.  Constitutional:      Appearance: Normal appearance. She is obese.  HENT:     Head: Normocephalic and atraumatic.     Mouth/Throat:     Mouth: Mucous membranes are moist.     Pharynx: No posterior oropharyngeal erythema.  Eyes:     Extraocular Movements:  Extraocular movements intact.     Pupils: Pupils are equal, round, and reactive to light.  Cardiovascular:     Rate and Rhythm: Normal rate and regular rhythm.     Pulses: Normal pulses.     Heart sounds: Normal heart sounds.  Pulmonary:     Effort: Pulmonary effort is normal.     Breath sounds: Normal breath sounds.  Skin:    General: Skin is warm and dry.  Neurological:     General: No focal deficit present.     Mental Status: She is alert.  Psychiatric:        Mood and Affect: Mood normal.        Behavior: Behavior normal.        Assessment/Plan: 1. Essential hypertension Borderline in office, likely due to knee pain and discuss son's autopsy report. Will monitor  2. Grief at loss of child Continue lexapro   General Counseling: Harleigh verbalizes understanding of the findings of todays visit and agrees with plan of treatment. I have discussed any further diagnostic evaluation that may be needed or ordered today. We also reviewed her medications today. she has been encouraged to call the office with any questions or concerns that should arise related to todays visit.    No orders of the defined types were placed in this encounter.   No orders of the defined types were placed in this encounter.   This patient was seen by Lynn Ito, PA-C in collaboration with Dr. Beverely Risen as a part of collaborative care agreement.   Total time spent:30 Minutes Time spent includes review of chart, medications, test results, and follow up plan with the patient.      Dr Lyndon Code Internal medicine

## 2023-05-20 LAB — PULMONARY FUNCTION TEST

## 2023-06-01 ENCOUNTER — Other Ambulatory Visit
Admission: RE | Admit: 2023-06-01 | Discharge: 2023-06-01 | Disposition: A | Discharge status: Home / Self Care | Payer: Medicare Other | Attending: Orthopedic Surgery | Admitting: Orthopedic Surgery

## 2023-06-01 DIAGNOSIS — E1159 Type 2 diabetes mellitus with other circulatory complications: Secondary | ICD-10-CM | POA: Diagnosis not present

## 2023-06-01 DIAGNOSIS — R5383 Other fatigue: Secondary | ICD-10-CM | POA: Diagnosis present

## 2023-06-01 LAB — COMPREHENSIVE METABOLIC PANEL
ALT: 14 U/L (ref 0–44)
AST: 15 U/L (ref 15–41)
Albumin: 3.9 g/dL (ref 3.5–5.0)
Alkaline Phosphatase: 61 U/L (ref 38–126)
Anion gap: 7 (ref 5–15)
BUN: 21 mg/dL (ref 8–23)
CO2: 25 mmol/L (ref 22–32)
Calcium: 8.9 mg/dL (ref 8.9–10.3)
Chloride: 102 mmol/L (ref 98–111)
Creatinine, Ser: 0.81 mg/dL (ref 0.44–1.00)
GFR, Estimated: >60 mL/min (ref >60)
Glucose, Bld: 134 mg/dL — ABNORMAL HIGH (ref 70–99)
Potassium: 3.6 mmol/L (ref 3.5–5.1)
Sodium: 134 mmol/L — ABNORMAL LOW (ref 135–145)
Total Bilirubin: 0.6 mg/dL (ref <1.2)
Total Protein: 7.4 g/dL (ref 6.5–8.1)

## 2023-06-01 LAB — CBC WITH DIFFERENTIAL/PLATELET
Abs Immature Granulocytes: 0.02 10*3/uL (ref 0.00–0.07)
Basophils Absolute: 0 10*3/uL (ref 0.0–0.1)
Basophils Relative: 0 %
Eosinophils Absolute: 0.1 10*3/uL (ref 0.0–0.5)
Eosinophils Relative: 1 %
HCT: 40.8 % (ref 36.0–46.0)
Hemoglobin: 13.3 g/dL (ref 12.0–15.0)
Immature Granulocytes: 0 %
Lymphocytes Relative: 29 %
Lymphs Abs: 2.4 10*3/uL (ref 0.7–4.0)
MCH: 25.6 pg — ABNORMAL LOW (ref 26.0–34.0)
MCHC: 32.6 g/dL (ref 30.0–36.0)
MCV: 78.6 fL — ABNORMAL LOW (ref 80.0–100.0)
Monocytes Absolute: 0.6 10*3/uL (ref 0.1–1.0)
Monocytes Relative: 8 %
Neutro Abs: 5.2 10*3/uL (ref 1.7–7.7)
Neutrophils Relative %: 62 %
Platelets: 155 10*3/uL (ref 150–400)
RBC: 5.19 MIL/uL — ABNORMAL HIGH (ref 3.87–5.11)
RDW: 15.5 % (ref 11.5–15.5)
WBC: 8.4 10*3/uL (ref 4.0–10.5)
nRBC: 0 % (ref 0.0–0.2)

## 2023-06-01 LAB — HEMOGLOBIN A1C
Hgb A1c MFr Bld: 7.6 % — ABNORMAL HIGH (ref 4.8–5.6)
Mean Plasma Glucose: 171.42 mg/dL

## 2023-07-06 ENCOUNTER — Other Ambulatory Visit: Payer: Self-pay | Admitting: Physician Assistant

## 2023-07-12 ENCOUNTER — Telehealth: Payer: Self-pay

## 2023-07-13 ENCOUNTER — Other Ambulatory Visit: Payer: Self-pay

## 2023-07-13 MED ORDER — FLUCONAZOLE 150 MG PO TABS: ORAL_TABLET | ORAL | 0 refills | Status: DC

## 2023-07-13 NOTE — Telephone Encounter (Signed)
 Medicine sent and patient notified.

## 2023-08-11 ENCOUNTER — Ambulatory Visit: Payer: Medicare Other | Admitting: Physician Assistant

## 2023-08-29 ENCOUNTER — Ambulatory Visit: Payer: Medicare Other | Admitting: Physician Assistant

## 2023-09-05 ENCOUNTER — Encounter: Payer: Self-pay | Admitting: Physician Assistant

## 2023-09-05 ENCOUNTER — Ambulatory Visit (INDEPENDENT_AMBULATORY_CARE_PROVIDER_SITE_OTHER): Payer: Medicare Other | Admitting: Physician Assistant

## 2023-09-05 VITALS — BP 145/90 | HR 86 | Temp 97.6°F | Resp 16 | Ht 67.0 in | Wt 239.0 lb

## 2023-09-05 DIAGNOSIS — K219 Gastro-esophageal reflux disease without esophagitis: Secondary | ICD-10-CM | POA: Diagnosis not present

## 2023-09-05 DIAGNOSIS — Z8674 Personal history of sudden cardiac arrest: Secondary | ICD-10-CM | POA: Diagnosis not present

## 2023-09-05 DIAGNOSIS — M1711 Unilateral primary osteoarthritis, right knee: Secondary | ICD-10-CM | POA: Diagnosis not present

## 2023-09-05 DIAGNOSIS — I10 Essential (primary) hypertension: Secondary | ICD-10-CM

## 2023-09-05 MED ORDER — FUROSEMIDE 40 MG PO TABS: 40.0000 mg | ORAL_TABLET | Freq: Two times a day (BID) | ORAL | 1 refills | Status: DC

## 2023-09-05 MED ORDER — FAMOTIDINE 20 MG PO TABS: 20.0000 mg | ORAL_TABLET | Freq: Two times a day (BID) | ORAL | 1 refills | Status: DC

## 2023-09-05 MED ORDER — OXYBUTYNIN CHLORIDE 5 MG PO TABS: 5.0000 mg | ORAL_TABLET | Freq: Every day | ORAL | 0 refills | Status: DC

## 2023-09-05 NOTE — Progress Notes (Signed)
 [facility] [address] [place], [place] [address]  Internal MEDICINE  Office Visit Note  Patient Name: Felicia Woods  [id number]  [id number]  Date of Service: 09/20/2023  Chief Complaint  Patient presents with   Follow-up    Review labs   Diabetes   Hypertension   Hyperlipidemia   Medication Refill    Oxybutynin, Famotidine and Furosemide    HPI Pt is here for routine follow up -was supposed to have right TKA, but unfortunately she went into cardiac arrest during surgery before procedure could be performed. She states she woke up at [facility] -Seeing cardiology in 2 weeks -Doing Pt for right knee now -Doing home exercises as well -BP elevated on recheck 148/90--will start monitoring at home again, may be up from exercising before visit. Will be rechecked at cardiology office as well -Needs med refills  Current Medication: Outpatient Encounter Medications as of 09/05/2023  Medication Sig   amLODipine (NORVASC) 10 MG tablet Take 1 tablet by mouth daily.   aspirin EC 81 MG tablet Take 81 mg by mouth daily.   atorvastatin (LIPITOR) 40 MG tablet Take 1 tablet (40 mg total) by mouth daily.   Blood Glucose Monitoring Suppl (ONETOUCH VERIO FLEX SYSTEM) w/Device KIT Use as directed twice a daily DX E11.65   Budeson-Glycopyrrol-Formoterol (BREZTRI AEROSPHERE) 160-9-4.8 MCG/ACT AERO Inhale 2 puffs into the lungs 2 (two) times daily.   clopidogrel (PLAVIX) 75 MG tablet Take 1 tablet by mouth daily.   cyclobenzaprine (FLEXERIL) 10 MG tablet Take 1 tablet by mouth at bedtime.   Dulaglutide (TRULICITY) 3 MG/0.5ML SOPN Inject 3 mg as directed once a week.   empagliflozin (JARDIANCE) 25 MG TABS tablet Take one tab a day for diabetes   escitalopram (LEXAPRO) 10 MG tablet Take 1 tablet (10 mg total) by mouth daily.   fluconazole (DIFLUCAN) 150 MG tablet Take 1 tablet then can take another tablet in 3 days if symptoms are still present.   glucose blood (ONETOUCH VERIO) test  strip Use as instructed twice daily DX E11.65   isosorbide mononitrate (IMDUR) 120 MG 24 hr tablet Take 1 tablet by mouth daily.   losartan (COZAAR) 100 MG tablet Take 1 tablet by mouth once daily   metoprolol succinate (TOPROL-XL) 100 MG 24 hr tablet Take 100 mg by mouth daily.   Misc. Devices (ADJUST BATH/SHOWER SEAT) MISC 1 shower seat for use at home to reduce risk of falling.   montelukast (SINGULAIR) 10 MG tablet TAKE 1 TABLET BY MOUTH AT BEDTIME   nitroGLYCERIN (NITRODUR - DOSED IN MG/24 HR) 0.1 mg/hr patch Place 0.1 mg onto the skin daily.   nitroGLYCERIN (NITROSTAT) 0.4 MG SL tablet DISSOLVE ONE TABLET UNDER THE TONGUE EVERY 5 MINUTES AS NEEDED FOR CHEST PAIN.  DO NOT EXCEED A TOTAL OF 3 DOSES IN 15 MINUTES   nystatin (MYCOSTATIN) 100000 UNIT/ML suspension Take 5 mLs (500,000 Units total) by mouth daily. Swish and spit   OneTouch Delica Lancets 30G MISC USE 1  TO CHECK GLUCOSE TWICE DAILY AS DIRECTED   PROAIR RESPICLICK 108 (90 Base) MCG/ACT AEPB Inhale 2 puffs into the lungs every 6 (six) hours as needed.   ranolazine (RANEXA) 1000 MG SR tablet Take 1 tablet (1,000 mg total) by mouth 2 (two) times daily.   zolpidem (AMBIEN) 5 MG tablet Take 1 tablet (5 mg total) by mouth at bedtime as needed. for sleep   [DISCONTINUED] azithromycin (ZITHROMAX) 250 MG tablet Take one tab a day for 10 days  for uri   [DISCONTINUED] benzonatate (TESSALON) 200 MG capsule Take 1 capsule (200 mg total) by mouth 2 (two) times daily as needed for cough.   [DISCONTINUED] famotidine (PEPCID) 20 MG tablet Take 1 tablet (20 mg total) by mouth 2 (two) times daily.   [DISCONTINUED] furosemide (LASIX) 40 MG tablet Take 1 tablet (40 mg total) by mouth 2 (two) times daily.   [DISCONTINUED] oxybutynin (DITROPAN) 5 MG tablet Take 1 tablet by mouth once daily   famotidine (PEPCID) 20 MG tablet Take 1 tablet (20 mg total) by mouth 2 (two) times daily.   furosemide (LASIX) 40 MG tablet Take 1 tablet (40 mg total) by mouth 2  (two) times daily.   oxybutynin (DITROPAN) 5 MG tablet Take 1 tablet (5 mg total) by mouth daily.   No facility-administered encounter medications on file as of 09/05/2023.    Surgical History: Past Surgical History:  Procedure Laterality Date   ABDOMINAL HYSTERECTOMY     CARDIAC CATHETERIZATION     CARDIAC CATHETERIZATION N/A 10/18/2015   Procedure: Left Heart Cath and Cors/Grafts Angiography;  Surgeon: Runell Gess, MD;  Location: [facility] INVASIVE CV LAB;  Service: Cardiovascular;  Laterality: N/A;   CARDIAC CATHETERIZATION N/A 10/18/2015   Procedure: Coronary Stent Intervention;  Surgeon: Runell Gess, MD;  Location: [facility] INVASIVE CV LAB;  Service: Cardiovascular;  Laterality: N/A;   CARDIAC SURGERY     CHOLECYSTECTOMY     COLONOSCOPY WITH PROPOFOL N/A 12/02/2021   Procedure: COLONOSCOPY WITH PROPOFOL;  Surgeon: Wyline Mood, MD;  Location: [facility] ENDOSCOPY;  Service: Gastroenterology;  Laterality: N/A;   CORONARY ANGIOPLASTY     CORONARY ARTERY BYPASS GRAFT     4 vessels - 2010   CORONARY STENT INTERVENTION N/A 02/16/2017   Procedure: CORONARY STENT INTERVENTION;  Surgeon: Alwyn Pea, MD;  Location: [facility] INVASIVE CV LAB;  Service: Cardiovascular;  Laterality: N/A;   CORONARY STENT INTERVENTION N/A 02/13/2019   Procedure: CORONARY STENT INTERVENTION;  Surgeon: Yvonne Kendall, MD;  Location: [facility] INVASIVE CV LAB;  Service: Cardiovascular;  Laterality: N/A;  SVG to RCA   CORONARY STENT INTERVENTION Left 03/08/2019   Procedure: CORONARY STENT INTERVENTION;  Surgeon: Alwyn Pea, MD;  Location: [facility] INVASIVE CV LAB;  Service: Cardiovascular;  Laterality: Left;   CORONARY STENT INTERVENTION N/A 11/04/2020   Procedure: CORONARY STENT INTERVENTION;  Surgeon: Yvonne Kendall, MD;  Location: [facility] INVASIVE CV LAB;  Service: Cardiovascular;  Laterality: N/A;   CORONARY STENT INTERVENTION N/A 12/24/2021   Procedure: CORONARY STENT INTERVENTION;  Surgeon: Armando Reichert, MD;  Location:  [facility] INVASIVE CV LAB;  Service: Cardiovascular;  Laterality: N/A;   ESOPHAGOGASTRODUODENOSCOPY N/A 12/02/2021   Procedure: ESOPHAGOGASTRODUODENOSCOPY (EGD);  Surgeon: Wyline Mood, MD;  Location: [facility] ENDOSCOPY;  Service: Gastroenterology;  Laterality: N/A;   LEFT HEART CATH AND CORONARY ANGIOGRAPHY Left 02/16/2017   Procedure: LEFT HEART CATH AND CORONARY ANGIOGRAPHY;  Surgeon: Lamar Blinks, MD;  Location: [facility] INVASIVE CV LAB;  Service: Cardiovascular;  Laterality: Left;   LEFT HEART CATH AND CORONARY ANGIOGRAPHY Left 12/24/2021   Procedure: LEFT HEART CATH AND CORONARY ANGIOGRAPHY;  Surgeon: Lamar Blinks, MD;  Location: [facility] INVASIVE CV LAB;  Service: Cardiovascular;  Laterality: Left;   LEFT HEART CATH AND CORONARY ANGIOGRAPHY Left 06/17/2022   Procedure: LEFT HEART CATH AND CORONARY ANGIOGRAPHY;  Surgeon: Alwyn Pea, MD;  Location: [facility] INVASIVE CV LAB;  Service: Cardiovascular;  Laterality: Left;   LEFT HEART CATH AND CORS/GRAFTS ANGIOGRAPHY Left 11/23/2017   Procedure:  LEFT HEART CATH AND CORS/GRAFTS ANGIOGRAPHY;  Surgeon: Lamar Blinks, MD;  Location: [facility] INVASIVE CV LAB;  Service: Cardiovascular;  Laterality: Left;   LEFT HEART CATH AND CORS/GRAFTS ANGIOGRAPHY N/A 02/13/2019   Procedure: LEFT HEART CATH AND CORS/GRAFTS ANGIOGRAPHY;  Surgeon: Lamar Blinks, MD;  Location: [facility] INVASIVE CV LAB;  Service: Cardiovascular;  Laterality: N/A;   LEFT HEART CATH AND CORS/GRAFTS ANGIOGRAPHY N/A 07/03/2019   Procedure: LEFT HEART CATH AND CORS/GRAFTS ANGIOGRAPHY;  Surgeon: Lamar Blinks, MD;  Location: [facility] INVASIVE CV LAB;  Service: Cardiovascular;  Laterality: N/A;   LEFT HEART CATH AND CORS/GRAFTS ANGIOGRAPHY N/A 11/04/2020   Procedure: LEFT HEART CATH AND CORS/GRAFTS ANGIOGRAPHY;  Surgeon: Lamar Blinks, MD;  Location: [facility] INVASIVE CV LAB;  Service: Cardiovascular;  Laterality: N/A;    Medical History: Past Medical History:  Diagnosis Date   Asthma    Coronary  artery disease    Diabetes mellitus without complication (HCC)    Heart attack (HCC)    Hyperlipidemia    Hypertension    MI (myocardial infarction) (HCC)    Migraine headache with aura    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    Family History: Family History  Problem Relation Age of Onset   Diabetes Mother    Diabetes Father    Cancer Father    Diabetes Brother     Social History   Socioeconomic History   Marital status: Widowed    Spouse name: Fayrene Fearing   Number of children: Not on file   Years of education: Not on file   Highest education level: Not on file  Occupational History   Not on file  Tobacco Use   Smoking status: Never   Smokeless tobacco: Never  Vaping Use   Vaping status: Never Used  Substance and Sexual Activity   Alcohol use: No    Alcohol/week: 0.0 standard drinks of alcohol   Drug use: No   Sexual activity: Yes  Other Topics Concern   Not on file  Social History Narrative   Not on file   Social Drivers of Health   Financial Resource Strain: Low Risk  (07/05/2023)   Received from [facility]   Overall Financial Resource Strain (CARDIA)    Difficulty of Paying Living Expenses: Not hard at all  Food Insecurity: No Food Insecurity (07/05/2023)   Received from [facility]   Hunger Vital Sign    Worried About Running Out of Food in the Last Year: Never true    Ran Out of Food in the Last Year: Never true  Transportation Needs: No Transportation Needs (07/05/2023)   Received from [facility] - Transportation    In the past 12 months, has lack of transportation kept you from medical appointments or from getting medications?: No    Lack of Transportation (Non-Medical): No  Physical Activity: Not on file  Stress: Not on file  Social Connections: Unknown (11/17/2021)   Received from [facility]   Social Network    Social Network: Not on file  Intimate Partner Violence:  Unknown (10/09/2021)   Received from [facility]   HITS    Physically Hurt: Not on file    Insult or Talk Down To: Not on file    Threaten Physical Harm: Not on file    Scream or Curse: Not on file      Review of Systems  Constitutional:  Negative for  chills, fatigue and unexpected weight change.  HENT:  Negative for congestion, postnasal drip, rhinorrhea, sneezing and sore throat.   Eyes:  Negative for redness.  Respiratory:  Negative for chest tightness.   Cardiovascular:  Negative for chest pain and palpitations.  Gastrointestinal:  Negative for abdominal pain, constipation, diarrhea, nausea and vomiting.  Genitourinary:  Negative for dysuria and frequency.  Musculoskeletal:  Positive for arthralgias. Negative for back pain, joint swelling and neck pain.  Skin:  Negative for rash.  Neurological: Negative.  Negative for tremors and numbness.  Hematological:  Negative for adenopathy. Does not bruise/bleed easily.  Psychiatric/Behavioral:  Positive for behavioral problems (Depression) and sleep disturbance. Negative for suicidal ideas. The patient is not nervous/anxious.     Vital Signs: BP (!) 145/90   Pulse 86   Temp 97.6 F (36.4 C)   Resp 16   Ht 5\' 7"  (1.702 m)   Wt 239 lb (108.4 kg)   SpO2 98%   BMI 37.43 kg/m    Physical Exam Vitals and nursing note reviewed.  Constitutional:      Appearance: Normal appearance. She is obese.  HENT:     Head: Normocephalic and atraumatic.     Mouth/Throat:     Mouth: Mucous membranes are moist.     Pharynx: No posterior oropharyngeal erythema.  Eyes:     Extraocular Movements: Extraocular movements intact.     Pupils: Pupils are equal, round, and reactive to light.  Cardiovascular:     Rate and Rhythm: Normal rate and regular rhythm.     Pulses: Normal pulses.     Heart sounds: Normal heart sounds.  Pulmonary:     Effort: Pulmonary effort is normal.     Breath sounds: Normal breath sounds.  Skin:     General: Skin is warm and dry.  Neurological:     General: No focal deficit present.     Mental Status: She is alert.  Psychiatric:        Mood and Affect: Mood normal.        Behavior: Behavior normal.        Assessment/Plan: 1. Essential hypertension (Primary) Slightly elevated in office and may be due exercising prior to visit. Will monitor closely - furosemide (LASIX) 40 MG tablet; Take 1 tablet (40 mg total) by mouth 2 (two) times daily.  Dispense: 180 tablet; Refill: 1  2. 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 Will follow up with cardiology  3. Arthritis of right knee Followed by ortho and undergoing PT  4. GERD without esophagitis May continue pepcid   General Counseling: Larsen verbalizes understanding of the findings of todays visit and agrees with plan of treatment. I have discussed any further diagnostic evaluation that may be needed or ordered today. We also reviewed her medications today. she has been encouraged to call the office with any questions or concerns that should arise related to todays visit.    No orders of the defined types were placed in this encounter.   Meds ordered this encounter  Medications   famotidine (PEPCID) 20 MG tablet    Sig: Take 1 tablet (20 mg total) by mouth 2 (two) times daily.    Dispense:  90 tablet    Refill:  1   furosemide (LASIX) 40 MG tablet    Sig: Take 1 tablet (40 mg total) by mouth 2 (two) times daily.    Dispense:  180 tablet    Refill:  1  oxybutynin (DITROPAN) 5 MG tablet    Sig: Take 1 tablet (5 mg total) by mouth daily.    Dispense:  90 tablet    Refill:  0    This patient was seen by Lynn Ito, PA-C in collaboration with Dr. Beverely Risen as a part of collaborative care agreement.   Total time spent:30 Minutes Time spent includes review of chart, medications, test results, and follow up plan with the patient.      Dr Lyndon Code Internal medicine

## 2023-09-28 ENCOUNTER — Ambulatory Visit

## 2023-10-07 ENCOUNTER — Telehealth: Payer: Self-pay | Admitting: Physician Assistant

## 2023-10-07 NOTE — Telephone Encounter (Addendum)
 07/05/21-12/24-24 MR emailed to [employer]

## 2023-11-08 ENCOUNTER — Telehealth: Payer: Self-pay | Admitting: Physician Assistant

## 2023-11-08 NOTE — Telephone Encounter (Signed)
 Per request from [facility] 07/05/22-07/05/23 office notes faxed; [phone]

## 2023-11-24 ENCOUNTER — Encounter: Payer: Self-pay | Admitting: Physician Assistant

## 2023-11-24 ENCOUNTER — Ambulatory Visit (INDEPENDENT_AMBULATORY_CARE_PROVIDER_SITE_OTHER): Admitting: Physician Assistant

## 2023-11-24 VITALS — BP 168/92 | HR 81 | Temp 98.3°F | Resp 16 | Ht 67.0 in | Wt 223.0 lb

## 2023-11-24 DIAGNOSIS — E782 Mixed hyperlipidemia: Secondary | ICD-10-CM

## 2023-11-24 DIAGNOSIS — F331 Major depressive disorder, recurrent, moderate: Secondary | ICD-10-CM | POA: Diagnosis not present

## 2023-11-24 DIAGNOSIS — G4709 Other insomnia: Secondary | ICD-10-CM

## 2023-11-24 DIAGNOSIS — I10 Essential (primary) hypertension: Secondary | ICD-10-CM | POA: Diagnosis not present

## 2023-11-24 DIAGNOSIS — E1159 Type 2 diabetes mellitus with other circulatory complications: Secondary | ICD-10-CM | POA: Diagnosis not present

## 2023-11-24 DIAGNOSIS — N390 Urinary tract infection, site not specified: Secondary | ICD-10-CM

## 2023-11-24 MED ORDER — ONETOUCH VERIO VI STRP: ORAL_STRIP | 3 refills | Status: AC

## 2023-11-24 MED ORDER — MONTELUKAST SODIUM 10 MG PO TABS: 10.0000 mg | ORAL_TABLET | Freq: Every day | ORAL | 1 refills | Status: AC

## 2023-11-24 MED ORDER — ZOLPIDEM TARTRATE 5 MG PO TABS: 5.0000 mg | ORAL_TABLET | Freq: Every evening | ORAL | 2 refills | Status: DC | PRN

## 2023-11-24 MED ORDER — FLUCONAZOLE 150 MG PO TABS: ORAL_TABLET | ORAL | 0 refills | Status: DC

## 2023-11-24 MED ORDER — ATORVASTATIN CALCIUM 40 MG PO TABS: 40.0000 mg | ORAL_TABLET | Freq: Every day | ORAL | 3 refills | Status: DC

## 2023-11-24 MED ORDER — TIRZEPATIDE 2.5 MG/0.5ML ~~LOC~~ SOAJ: 2.5000 mg | SUBCUTANEOUS | 2 refills | Status: DC

## 2023-11-24 MED ORDER — EMPAGLIFLOZIN 25 MG PO TABS: ORAL_TABLET | ORAL | 3 refills | Status: AC

## 2023-11-24 MED ORDER — OXYBUTYNIN CHLORIDE 5 MG PO TABS: 5.0000 mg | ORAL_TABLET | Freq: Every day | ORAL | 1 refills | Status: DC

## 2023-11-24 MED ORDER — ESCITALOPRAM OXALATE 10 MG PO TABS
10.0000 mg | ORAL_TABLET | Freq: Every day | ORAL | 2 refills | Status: AC
Start: 2023-11-24 — End: ?

## 2023-11-24 MED ORDER — HYDRALAZINE HCL 10 MG PO TABS: 10.0000 mg | ORAL_TABLET | Freq: Three times a day (TID) | ORAL | 2 refills | Status: DC

## 2023-11-24 MED ORDER — LOSARTAN POTASSIUM 100 MG PO TABS: 100.0000 mg | ORAL_TABLET | Freq: Every day | ORAL | 3 refills | Status: DC

## 2023-11-24 MED ORDER — FAMOTIDINE 20 MG PO TABS: 20.0000 mg | ORAL_TABLET | Freq: Two times a day (BID) | ORAL | 1 refills | Status: AC

## 2023-11-24 NOTE — Progress Notes (Signed)
 [facility] [address] [place], [place] [address]  Internal MEDICINE  Office Visit Note  Patient Name: Felicia Woods  [id number]  [id number]  Date of Service: 11/24/2023  Chief Complaint  Patient presents with   Acute Visit   Urinary Tract Infection   Depression     HPI Pt is here for a sick visit. -More stressed now. Got her son's ashes on mothers day and also put her mom on hospice. States she ran out of Lexapro  and will send refill to get back on this as it was helping previously -also waiting on knee brace before she can start PT. Not rescheduling surgery due to cardiac arrest last attempt -back at work 5 hours per day now, limited right now due to leg. -Praying and reading bible -itching and irritation, a little burning and will check urine, but states real reason for visit was depression not UTI -BP recheck 168/92, got cuff in mail and will start checking at home now -will send hydralazine  to take TID prn due to being high now -does need med refills  Current Medication:  Outpatient Encounter Medications as of 11/24/2023  Medication Sig   amLODipine  (NORVASC ) 10 MG tablet Take 1 tablet by mouth daily.   aspirin  EC 81 MG tablet Take 81 mg by mouth daily.   Blood Glucose Monitoring Suppl (ONETOUCH VERIO FLEX SYSTEM) w/Device KIT Use as directed twice a daily DX E11.65   Budeson-Glycopyrrol-Formoterol  (BREZTRI  AEROSPHERE) 160-9-4.8 MCG/ACT AERO Inhale 2 puffs into the lungs 2 (two) times daily.   clopidogrel  (PLAVIX ) 75 MG tablet Take 1 tablet by mouth daily.   cyclobenzaprine  (FLEXERIL ) 10 MG tablet Take 1 tablet by mouth at bedtime.   escitalopram  (LEXAPRO ) 10 MG tablet Take 1 tablet (10 mg total) by mouth daily.   furosemide  (LASIX ) 40 MG tablet Take 1 tablet (40 mg total) by mouth 2 (two) times daily.   hydrALAZINE  (APRESOLINE ) 10 MG tablet Take 1 tablet (10 mg total) by mouth 3 (three) times daily.   isosorbide  mononitrate (IMDUR ) 120 MG 24 hr tablet  Take 1 tablet by mouth daily.   metoprolol  succinate (TOPROL -XL) 100 MG 24 hr tablet Take 100 mg by mouth daily.   Misc. Devices (ADJUST BATH/SHOWER SEAT) MISC 1 shower seat for use at home to reduce risk of falling.   nitroGLYCERIN  (NITRODUR - DOSED IN MG/24 HR) 0.1 mg/hr patch Place 0.1 mg onto the skin daily.   nitroGLYCERIN  (NITROSTAT ) 0.4 MG SL tablet DISSOLVE ONE TABLET UNDER THE TONGUE EVERY 5 MINUTES AS NEEDED FOR CHEST PAIN.  DO NOT EXCEED A TOTAL OF 3 DOSES IN 15 MINUTES   nystatin  (MYCOSTATIN ) 100000 UNIT/ML suspension Take 5 mLs (500,000 Units total) by mouth daily. Swish and spit   OneTouch Delica Lancets 30G MISC USE 1  TO CHECK GLUCOSE TWICE DAILY AS DIRECTED   PROAIR  RESPICLICK 108 (90 Base) MCG/ACT AEPB Inhale 2 puffs into the lungs every 6 (six) hours as needed.   ranolazine  (RANEXA ) 1000 MG SR tablet Take 1 tablet (1,000 mg total) by mouth 2 (two) times daily.   tirzepatide  (MOUNJARO ) 2.5 MG/0.5ML Pen Inject 2.5 mg into the skin once a week.   [DISCONTINUED] atorvastatin  (LIPITOR ) 40 MG tablet Take 1 tablet (40 mg total) by mouth daily.   [DISCONTINUED] Dulaglutide  (TRULICITY ) 3 MG/0.5ML SOPN Inject 3 mg as directed once a week.   [DISCONTINUED] empagliflozin  (JARDIANCE ) 25 MG TABS tablet Take one tab a day for diabetes   [DISCONTINUED] escitalopram  (LEXAPRO ) 10 MG tablet  Take 1 tablet (10 mg total) by mouth daily.   [DISCONTINUED] famotidine  (PEPCID ) 20 MG tablet Take 1 tablet (20 mg total) by mouth 2 (two) times daily.   [DISCONTINUED] fluconazole  (DIFLUCAN ) 150 MG tablet Take 1 tablet then can take another tablet in 3 days if symptoms are still present.   [DISCONTINUED] glucose blood (ONETOUCH VERIO) test strip Use as instructed twice daily DX E11.65   [DISCONTINUED] losartan  (COZAAR ) 100 MG tablet Take 1 tablet by mouth once daily   [DISCONTINUED] montelukast  (SINGULAIR ) 10 MG tablet TAKE 1 TABLET BY MOUTH AT BEDTIME   [DISCONTINUED] oxybutynin  (DITROPAN ) 5 MG tablet Take 1  tablet (5 mg total) by mouth daily.   [DISCONTINUED] zolpidem  (AMBIEN ) 5 MG tablet Take 1 tablet (5 mg total) by mouth at bedtime as needed. for sleep   atorvastatin  (LIPITOR ) 40 MG tablet Take 1 tablet (40 mg total) by mouth daily.   empagliflozin  (JARDIANCE ) 25 MG TABS tablet Take one tab a day for diabetes   famotidine  (PEPCID ) 20 MG tablet Take 1 tablet (20 mg total) by mouth 2 (two) times daily.   fluconazole  (DIFLUCAN ) 150 MG tablet Take 1 tablet then can take another tablet in 3 days if symptoms are still present.   glucose blood (ONETOUCH VERIO) test strip Use as instructed twice daily DX E11.65   losartan  (COZAAR ) 100 MG tablet Take 1 tablet (100 mg total) by mouth daily.   montelukast  (SINGULAIR ) 10 MG tablet Take 1 tablet (10 mg total) by mouth at bedtime.   oxybutynin  (DITROPAN ) 5 MG tablet Take 1 tablet (5 mg total) by mouth daily.   zolpidem  (AMBIEN ) 5 MG tablet Take 1 tablet (5 mg total) by mouth at bedtime as needed. for sleep   No facility-administered encounter medications on file as of 11/24/2023.      Medical History: Past Medical History:  Diagnosis Date   Asthma    Coronary artery disease    Diabetes mellitus without complication (HCC)    Heart attack (HCC)    Hyperlipidemia    Hypertension    MI (myocardial infarction) (HCC)    Migraine headache with aura    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     Vital Signs: BP (!) 168/92 Comment: 171/91  Pulse 81   Temp 98.3 F (36.8 C)   Resp 16   Ht 5' 7 (1.702 m)   Wt 223 lb (101.2 kg)   SpO2 98%   BMI 34.93 kg/m    Review of Systems  Constitutional:  Negative for chills, fatigue and unexpected weight change.  HENT:  Negative for congestion, postnasal drip, rhinorrhea, sneezing and sore throat.   Eyes:  Negative for redness.  Respiratory:  Negative for chest tightness.   Cardiovascular:  Negative for chest pain and palpitations.  Gastrointestinal:  Negative for abdominal pain, constipation, diarrhea,  nausea and vomiting.  Genitourinary:  Positive for dysuria. Negative for frequency.  Musculoskeletal:  Positive for arthralgias. Negative for back pain, joint swelling and neck pain.  Skin:  Negative for rash.  Neurological: Negative.  Negative for tremors and numbness.  Hematological:  Negative for adenopathy. Does not bruise/bleed easily.  Psychiatric/Behavioral:  Positive for behavioral problems (Depression) and sleep disturbance. Negative for suicidal ideas. The patient is not nervous/anxious.     Physical Exam Vitals and nursing note reviewed.  Constitutional:      Appearance: Normal appearance. She is obese.  HENT:     Head: Normocephalic and atraumatic.   Eyes:  Extraocular Movements: Extraocular movements intact.    Cardiovascular:     Rate and Rhythm: Normal rate and regular rhythm.     Pulses: Normal pulses.     Heart sounds: Normal heart sounds.  Pulmonary:     Effort: Pulmonary effort is normal.     Breath sounds: Normal breath sounds.   Skin:    General: Skin is warm and dry.   Neurological:     General: No focal deficit present.     Mental Status: She is alert.   Psychiatric:        Mood and Affect: Mood normal.        Behavior: Behavior normal.       Assessment/Plan: 1. Moderate episode of recurrent major depressive disorder (HCC) (Primary) Will restart Lexapro , call if any concerns. Will follow up in 1 month  2. Essential hypertension Will add hydralazine  TID prn for elevated BP. Likely up in office due to acute concerns today and will monitor  3. Mixed hyperlipidemia - atorvastatin  (LIPITOR ) 40 MG tablet; Take 1 tablet (40 mg total) by mouth daily.  Dispense: 90 tablet; Refill: 3  4. Type 2 diabetes mellitus with other circulatory complication, without long-term current use of insulin  (HCC) Will switch from trulicity  to mounjaro  to help BG and wt loss and continue Jardiance  - empagliflozin  (JARDIANCE ) 25 MG TABS tablet; Take one tab a day for  diabetes  Dispense: 90 tablet; Refill: 3  5. Other insomnia - zolpidem  (AMBIEN ) 5 MG tablet; Take 1 tablet (5 mg total) by mouth at bedtime as needed. for sleep  Dispense: 30 tablet; Refill: 2  6. Urinary tract infection without hematuria, site unspecified Will send for culture - CULTURE, URINE COMPREHENSIVE   General Counseling: Loretha verbalizes understanding of the findings of todays visit and agrees with plan of treatment. I have discussed any further diagnostic evaluation that may be needed or ordered today. We also reviewed her medications today. she has been encouraged to call the office with any questions or concerns that should arise related to todays visit.    Counseling:    Orders Placed This Encounter  Procedures   CULTURE, URINE COMPREHENSIVE    Meds ordered this encounter  Medications   atorvastatin  (LIPITOR ) 40 MG tablet    Sig: Take 1 tablet (40 mg total) by mouth daily.    Dispense:  90 tablet    Refill:  3   losartan  (COZAAR ) 100 MG tablet    Sig: Take 1 tablet (100 mg total) by mouth daily.    Dispense:  90 tablet    Refill:  3   montelukast  (SINGULAIR ) 10 MG tablet    Sig: Take 1 tablet (10 mg total) by mouth at bedtime.    Dispense:  90 tablet    Refill:  1   oxybutynin  (DITROPAN ) 5 MG tablet    Sig: Take 1 tablet (5 mg total) by mouth daily.    Dispense:  90 tablet    Refill:  1   empagliflozin  (JARDIANCE ) 25 MG TABS tablet    Sig: Take one tab a day for diabetes    Dispense:  90 tablet    Refill:  3   zolpidem  (AMBIEN ) 5 MG tablet    Sig: Take 1 tablet (5 mg total) by mouth at bedtime as needed. for sleep    Dispense:  30 tablet    Refill:  2   glucose blood (ONETOUCH VERIO) test strip    Sig: Use as instructed twice daily  DX E11.65    Dispense:  100 each    Refill:  3   famotidine  (PEPCID ) 20 MG tablet    Sig: Take 1 tablet (20 mg total) by mouth 2 (two) times daily.    Dispense:  90 tablet    Refill:  1   tirzepatide  (MOUNJARO ) 2.5  MG/0.5ML Pen    Sig: Inject 2.5 mg into the skin once a week.    Dispense:  2 mL    Refill:  2   fluconazole  (DIFLUCAN ) 150 MG tablet    Sig: Take 1 tablet then can take another tablet in 3 days if symptoms are still present.    Dispense:  3 tablet    Refill:  0   escitalopram  (LEXAPRO ) 10 MG tablet    Sig: Take 1 tablet (10 mg total) by mouth daily.    Dispense:  30 tablet    Refill:  2   hydrALAZINE  (APRESOLINE ) 10 MG tablet    Sig: Take 1 tablet (10 mg total) by mouth 3 (three) times daily.    Dispense:  90 tablet    Refill:  2    Time spent:30 Minutes

## 2023-11-29 LAB — CULTURE, URINE COMPREHENSIVE

## 2023-12-01 ENCOUNTER — Ambulatory Visit: Payer: Self-pay | Admitting: Physician Assistant

## 2023-12-02 NOTE — Telephone Encounter (Signed)
Pt advised for UA result

## 2023-12-02 NOTE — Telephone Encounter (Signed)
-----   Message from Jacques Mattock sent at 12/01/2023  4:20 PM EDT ----- Please let her know urine showed normal flora

## 2023-12-19 ENCOUNTER — Encounter: Payer: Self-pay | Admitting: Physician Assistant

## 2023-12-19 ENCOUNTER — Ambulatory Visit: Payer: Medicare Other | Admitting: Physician Assistant

## 2023-12-19 VITALS — BP 125/86 | HR 74 | Temp 97.8°F | Resp 16 | Ht 67.0 in | Wt 241.6 lb

## 2023-12-19 DIAGNOSIS — I10 Essential (primary) hypertension: Secondary | ICD-10-CM

## 2023-12-19 DIAGNOSIS — Z0000 Encounter for general adult medical examination without abnormal findings: Secondary | ICD-10-CM | POA: Diagnosis not present

## 2023-12-19 DIAGNOSIS — F331 Major depressive disorder, recurrent, moderate: Secondary | ICD-10-CM

## 2023-12-19 DIAGNOSIS — E1159 Type 2 diabetes mellitus with other circulatory complications: Secondary | ICD-10-CM | POA: Diagnosis not present

## 2023-12-19 LAB — POCT GLYCOSYLATED HEMOGLOBIN (HGB A1C): Hemoglobin A1C: 7.6 % — AB (ref 4.0–5.6)

## 2023-12-19 NOTE — Progress Notes (Signed)
 [facility] [address] [place], [place] [address]  Internal MEDICINE  Office Visit Note  Patient Name: Felicia Woods  [id number]  [id number]  Date of Service: 12/19/2023  Chief Complaint  Patient presents with   Medicare Wellness   Diabetes   Hypertension   Hyperlipidemia    HPI Felicia Woods presents for an annual well visit Well-appearing 68 y.o. female  Routine CRC screening: due next year Routine mammogram: done in Feb DEXA scan: UTD Eye exam and/or foot exam: eye exam Labs: due -BP stable -Mounjaro  just approved and will be starting this week -mottling of left lower extremities, no pain or swelling, normal sensation, not cold, will monitor--call if any new symptoms or changes     12/19/2023    3:37 PM 12/17/2022   10:32 AM 09/03/2021    2:04 PM  MMSE - Mini Mental State Exam  Orientation to time 5 5 5   Orientation to Place 5 5 5   Registration 3 3 3   Attention/ Calculation 5 5 5   Recall 3 3 3   Language- name 2 objects 2 2 2   Language- repeat 1 1 1   Language- follow 3 step command 3 3 3   Language- read & follow direction 1 1 1   Write a sentence 1 0 1  Copy design 1 1 1   Total score 30 29 30     Functional Status Survey: Is the patient deaf or have difficulty hearing?: No Does the patient have difficulty seeing, even when wearing glasses/contacts?: No Does the patient have difficulty concentrating, remembering, or making decisions?: No Does the patient have difficulty walking or climbing stairs?: No Does the patient have difficulty dressing or bathing?: No Does the patient have difficulty doing errands alone such as visiting a doctor's office or shopping?: No     07/22/2022    8:25 AM 12/17/2022   10:31 AM 04/14/2023   11:36 AM 09/05/2023    2:52 PM 12/19/2023    3:37 PM  Fall Risk  Falls in the past year? 0 0 0 0 0  Was there an injury with Fall?  0     Fall Risk Category Calculator  0     Patient at Risk for Falls Due to  No Fall Risks     Fall  risk Follow up  Falls evaluation completed          12/19/2023    3:37 PM  Depression screen PHQ 2/9  Decreased Interest 0  Down, Depressed, Hopeless 0  PHQ - 2 Score 0        No data to display            Current Medication: Outpatient Encounter Medications as of 12/19/2023  Medication Sig   amLODipine  (NORVASC ) 10 MG tablet Take 1 tablet by mouth daily.   aspirin  EC 81 MG tablet Take 81 mg by mouth daily.   atorvastatin  (LIPITOR ) 40 MG tablet Take 1 tablet (40 mg total) by mouth daily.   Blood Glucose Monitoring Suppl (ONETOUCH VERIO FLEX SYSTEM) w/Device KIT Use as directed twice a daily DX E11.65   Budeson-Glycopyrrol-Formoterol  (BREZTRI  AEROSPHERE) 160-9-4.8 MCG/ACT AERO Inhale 2 puffs into the lungs 2 (two) times daily.   clopidogrel  (PLAVIX ) 75 MG tablet Take 1 tablet by mouth daily.   cyclobenzaprine  (FLEXERIL ) 10 MG tablet Take 1 tablet by mouth at bedtime.   empagliflozin  (JARDIANCE ) 25 MG TABS tablet Take one tab a day for diabetes   escitalopram  (LEXAPRO ) 10 MG tablet Take 1 tablet (10 mg total)  by mouth daily.   famotidine  (PEPCID ) 20 MG tablet Take 1 tablet (20 mg total) by mouth 2 (two) times daily.   fluconazole  (DIFLUCAN ) 150 MG tablet Take 1 tablet then can take another tablet in 3 days if symptoms are still present.   furosemide  (LASIX ) 40 MG tablet Take 1 tablet (40 mg total) by mouth 2 (two) times daily.   glucose blood (ONETOUCH VERIO) test strip Use as instructed twice daily DX E11.65   hydrALAZINE  (APRESOLINE ) 10 MG tablet Take 1 tablet (10 mg total) by mouth 3 (three) times daily.   isosorbide  mononitrate (IMDUR ) 120 MG 24 hr tablet Take 1 tablet by mouth daily.   losartan  (COZAAR ) 100 MG tablet Take 1 tablet (100 mg total) by mouth daily.   metoprolol  succinate (TOPROL -XL) 100 MG 24 hr tablet Take 100 mg by mouth daily.   Misc. Devices (ADJUST BATH/SHOWER SEAT) MISC 1 shower seat for use at home to reduce risk of falling.   montelukast  (SINGULAIR ) 10  MG tablet Take 1 tablet (10 mg total) by mouth at bedtime.   nitroGLYCERIN  (NITRODUR - DOSED IN MG/24 HR) 0.1 mg/hr patch Place 0.1 mg onto the skin daily.   nitroGLYCERIN  (NITROSTAT ) 0.4 MG SL tablet DISSOLVE ONE TABLET UNDER THE TONGUE EVERY 5 MINUTES AS NEEDED FOR CHEST PAIN.  DO NOT EXCEED A TOTAL OF 3 DOSES IN 15 MINUTES   nystatin  (MYCOSTATIN ) 100000 UNIT/ML suspension Take 5 mLs (500,000 Units total) by mouth daily. Swish and spit   OneTouch Delica Lancets 30G MISC USE 1  TO CHECK GLUCOSE TWICE DAILY AS DIRECTED   oxybutynin  (DITROPAN ) 5 MG tablet Take 1 tablet (5 mg total) by mouth daily.   PROAIR  RESPICLICK 108 (90 Base) MCG/ACT AEPB Inhale 2 puffs into the lungs every 6 (six) hours as needed.   ranolazine  (RANEXA ) 1000 MG SR tablet Take 1 tablet (1,000 mg total) by mouth 2 (two) times daily.   tirzepatide  (MOUNJARO ) 2.5 MG/0.5ML Pen Inject 2.5 mg into the skin once a week.   zolpidem  (AMBIEN ) 5 MG tablet Take 1 tablet (5 mg total) by mouth at bedtime as needed. for sleep   No facility-administered encounter medications on file as of 12/19/2023.    Surgical History: Past Surgical History:  Procedure Laterality Date   ABDOMINAL HYSTERECTOMY     CARDIAC CATHETERIZATION     CARDIAC CATHETERIZATION N/A 10/18/2015   Procedure: Left Heart Cath and Cors/Grafts Angiography;  Surgeon: Dorn JINNY Lesches, MD;  Location: [facility] INVASIVE CV LAB;  Service: Cardiovascular;  Laterality: N/A;   CARDIAC CATHETERIZATION N/A 10/18/2015   Procedure: Coronary Stent Intervention;  Surgeon: Dorn JINNY Lesches, MD;  Location: [facility] INVASIVE CV LAB;  Service: Cardiovascular;  Laterality: N/A;   CARDIAC SURGERY     CHOLECYSTECTOMY     COLONOSCOPY WITH PROPOFOL  N/A 12/02/2021   Procedure: COLONOSCOPY WITH PROPOFOL ;  Surgeon: Therisa Bi, MD;  Location: [facility] ENDOSCOPY;  Service: Gastroenterology;  Laterality: N/A;   CORONARY ANGIOPLASTY     CORONARY ARTERY BYPASS GRAFT     4 vessels - 2010   CORONARY STENT INTERVENTION  N/A 02/16/2017   Procedure: CORONARY STENT INTERVENTION;  Surgeon: Florencio Cara BIRCH, MD;  Location: [facility] INVASIVE CV LAB;  Service: Cardiovascular;  Laterality: N/A;   CORONARY STENT INTERVENTION N/A 02/13/2019   Procedure: CORONARY STENT INTERVENTION;  Surgeon: Mady Bruckner, MD;  Location: [facility] INVASIVE CV LAB;  Service: Cardiovascular;  Laterality: N/A;  SVG to RCA   CORONARY STENT INTERVENTION Left 03/08/2019   Procedure:  CORONARY STENT INTERVENTION;  Surgeon: Florencio Cara BIRCH, MD;  Location: [facility] INVASIVE CV LAB;  Service: Cardiovascular;  Laterality: Left;   CORONARY STENT INTERVENTION N/A 11/04/2020   Procedure: CORONARY STENT INTERVENTION;  Surgeon: Mady Bruckner, MD;  Location: [facility] INVASIVE CV LAB;  Service: Cardiovascular;  Laterality: N/A;   CORONARY STENT INTERVENTION N/A 12/24/2021   Procedure: CORONARY STENT INTERVENTION;  Surgeon: Lawyer Bernardino Cough, MD;  Location: [facility] INVASIVE CV LAB;  Service: Cardiovascular;  Laterality: N/A;   ESOPHAGOGASTRODUODENOSCOPY N/A 12/02/2021   Procedure: ESOPHAGOGASTRODUODENOSCOPY (EGD);  Surgeon: Therisa Bi, MD;  Location: [facility] ENDOSCOPY;  Service: Gastroenterology;  Laterality: N/A;   LEFT HEART CATH AND CORONARY ANGIOGRAPHY Left 02/16/2017   Procedure: LEFT HEART CATH AND CORONARY ANGIOGRAPHY;  Surgeon: Hester Wolm PARAS, MD;  Location: [facility] INVASIVE CV LAB;  Service: Cardiovascular;  Laterality: Left;   LEFT HEART CATH AND CORONARY ANGIOGRAPHY Left 12/24/2021   Procedure: LEFT HEART CATH AND CORONARY ANGIOGRAPHY;  Surgeon: Hester Wolm PARAS, MD;  Location: [facility] INVASIVE CV LAB;  Service: Cardiovascular;  Laterality: Left;   LEFT HEART CATH AND CORONARY ANGIOGRAPHY Left 06/17/2022   Procedure: LEFT HEART CATH AND CORONARY ANGIOGRAPHY;  Surgeon: Florencio Cara BIRCH, MD;  Location: [facility] INVASIVE CV LAB;  Service: Cardiovascular;  Laterality: Left;   LEFT HEART CATH AND CORS/GRAFTS ANGIOGRAPHY Left 11/23/2017   Procedure: LEFT HEART CATH AND  CORS/GRAFTS ANGIOGRAPHY;  Surgeon: Hester Wolm PARAS, MD;  Location: [facility] INVASIVE CV LAB;  Service: Cardiovascular;  Laterality: Left;   LEFT HEART CATH AND CORS/GRAFTS ANGIOGRAPHY N/A 02/13/2019   Procedure: LEFT HEART CATH AND CORS/GRAFTS ANGIOGRAPHY;  Surgeon: Hester Wolm PARAS, MD;  Location: [facility] INVASIVE CV LAB;  Service: Cardiovascular;  Laterality: N/A;   LEFT HEART CATH AND CORS/GRAFTS ANGIOGRAPHY N/A 07/03/2019   Procedure: LEFT HEART CATH AND CORS/GRAFTS ANGIOGRAPHY;  Surgeon: Hester Wolm PARAS, MD;  Location: [facility] INVASIVE CV LAB;  Service: Cardiovascular;  Laterality: N/A;   LEFT HEART CATH AND CORS/GRAFTS ANGIOGRAPHY N/A 11/04/2020   Procedure: LEFT HEART CATH AND CORS/GRAFTS ANGIOGRAPHY;  Surgeon: Hester Wolm PARAS, MD;  Location: [facility] INVASIVE CV LAB;  Service: Cardiovascular;  Laterality: N/A;    Medical History: Past Medical History:  Diagnosis Date   Asthma    Coronary artery disease    Diabetes mellitus without complication (HCC)    Heart attack (HCC)    Hyperlipidemia    Hypertension    MI (myocardial infarction) (HCC)    Migraine headache with aura    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    Family History: Family History  Problem Relation Age of Onset   Diabetes Mother    Diabetes Father    Cancer Father    Diabetes Brother     Social History   Socioeconomic History   Marital status: Widowed    Spouse name: lynwood   Number of children: Not on file   Years of education: Not on file   Highest education level: Not on file  Occupational History   Not on file  Tobacco Use   Smoking status: Never   Smokeless tobacco: Never  Vaping Use   Vaping status: Never Used  Substance and Sexual Activity   Alcohol use: No    Alcohol/week: 0.0 standard drinks of alcohol   Drug use: No   Sexual activity: Yes  Other Topics Concern   Not on file  Social History Narrative   Not on file   Social Drivers of Health   Financial Resource Strain: Low Risk  (  07/05/2023)    Received from [facility]   Overall Financial Resource Strain (CARDIA)    Difficulty of Paying Living Expenses: Not hard at all  Food Insecurity: No Food Insecurity (07/05/2023)   Received from [facility]   Hunger Vital Sign    Within the past 12 months, you worried that your food would run out before you got the money to buy more.: Never true    Within the past 12 months, the food you bought just didn't last and you didn't have money to get more.: Never true  Transportation Needs: No Transportation Needs (07/05/2023)   Received from [facility] - Transportation    In the past 12 months, has lack of transportation kept you from medical appointments or from getting medications?: No    Lack of Transportation (Non-Medical): No  Physical Activity: Not on file  Stress: Not on file  Social Connections: Unknown (11/17/2021)   Received [facility]   Social Network    Social Network: Not on file  Intimate Partner Violence: Unknown (10/09/2021)   Received from [facility]   HITS    Physically Hurt: Not on file    Insult or Talk Down To: Not on file    Threaten Physical Harm: Not on file    Scream or Curse: Not on file      Review of Systems  Constitutional:  Negative for chills, fatigue and unexpected weight change.  HENT:  Negative for congestion, postnasal drip, rhinorrhea, sneezing and sore throat.   Eyes:  Negative for redness.  Respiratory:  Negative for chest tightness.   Cardiovascular:  Negative for chest pain and palpitations.  Gastrointestinal:  Negative for abdominal pain, constipation, diarrhea, nausea and vomiting.  Genitourinary:  Negative for frequency.  Musculoskeletal:  Positive for arthralgias. Negative for back pain, joint swelling and neck pain.  Skin:  Negative for rash.  Neurological: Negative.  Negative for tremors and numbness.  Hematological:  Negative for adenopathy. Does not  bruise/bleed easily.  Psychiatric/Behavioral:  Positive for behavioral problems (Depression) and sleep disturbance. Negative for suicidal ideas. The patient is not nervous/anxious.     Vital Signs: BP 125/86   Pulse 74   Temp 97.8 F (36.6 C)   Resp 16   Ht 5' 7 (1.702 m)   Wt 241 lb 9.6 oz (109.6 kg)   SpO2 98%   BMI 37.84 kg/m    Physical Exam Vitals and nursing note reviewed.  Constitutional:      Appearance: Normal appearance. She is obese.  HENT:     Head: Normocephalic and atraumatic.  Cardiovascular:     Rate and Rhythm: Normal rate and regular rhythm.     Pulses: Normal pulses.     Heart sounds: Normal heart sounds.  Pulmonary:     Effort: Pulmonary effort is normal.     Breath sounds: Normal breath sounds.  Skin:    General: Skin is warm and dry.     Comments: Some mottling of LE, no change in sensation, no swelling, or pain  Neurological:     General: No focal deficit present.     Mental Status: She is alert.  Psychiatric:        Mood and Affect: Mood normal.        Behavior: Behavior normal.        Assessment/Plan: 1. Encounter for annual wellness exam in Medicare patient (Primary) AWV performed, UTD on PHM  2. Type  2 diabetes mellitus with other circulatory complication, without long-term current use of insulin  (HCC) - POCT HgB A1C is 7.6 which is stable from last visit. Pt approved for mounjaro  and will be starting now  3. Essential hypertension Stable, continue current medication  4. Moderate episode of recurrent major depressive disorder (HCC) Continue lexapro     General Counseling: Yatziry verbalizes understanding of the findings of todays visit and agrees with plan of treatment. I have discussed any further diagnostic evaluation that may be needed or ordered today. We also reviewed her medications today. she has been encouraged to call the office with any questions or concerns that should arise related to todays visit.    Orders Placed  This Encounter  Procedures   POCT HgB A1C    No orders of the defined types were placed in this encounter.   Return in about 4 weeks (around 01/16/2024) for recheck leg, follow up mounjaro .   Total time spent:35 Minutes Time spent includes review of chart, medications, test results, and follow up plan with the patient.   [place] Controlled Substance Database was reviewed by me.  This patient was seen by Tinnie Pro, PA-C in collaboration with Dr. Sigrid Bathe as a part of collaborative care agreement.  Tinnie Pro, PA-C Internal medicine

## 2024-02-09 ENCOUNTER — Emergency Department

## 2024-02-09 ENCOUNTER — Other Ambulatory Visit: Payer: Self-pay

## 2024-02-09 ENCOUNTER — Observation Stay
Admission: EM | Admit: 2024-02-09 | Discharge: 2024-02-14 | Disposition: A | Discharge status: Home / Self Care | Attending: Internal Medicine | Admitting: Internal Medicine

## 2024-02-09 DIAGNOSIS — I214 Non-ST elevation (NSTEMI) myocardial infarction: Principal | ICD-10-CM | POA: Diagnosis present

## 2024-02-09 DIAGNOSIS — F32A Depression, unspecified: Secondary | ICD-10-CM | POA: Diagnosis not present

## 2024-02-09 DIAGNOSIS — I10 Essential (primary) hypertension: Secondary | ICD-10-CM | POA: Diagnosis present

## 2024-02-09 DIAGNOSIS — I110 Hypertensive heart disease with heart failure: Secondary | ICD-10-CM | POA: Diagnosis not present

## 2024-02-09 DIAGNOSIS — E119 Type 2 diabetes mellitus without complications: Secondary | ICD-10-CM | POA: Insufficient documentation

## 2024-02-09 DIAGNOSIS — I209 Angina pectoris, unspecified: Secondary | ICD-10-CM | POA: Insufficient documentation

## 2024-02-09 DIAGNOSIS — I5032 Chronic diastolic (congestive) heart failure: Secondary | ICD-10-CM | POA: Diagnosis not present

## 2024-02-09 DIAGNOSIS — K219 Gastro-esophageal reflux disease without esophagitis: Secondary | ICD-10-CM | POA: Insufficient documentation

## 2024-02-09 DIAGNOSIS — J4520 Mild intermittent asthma, uncomplicated: Secondary | ICD-10-CM | POA: Diagnosis not present

## 2024-02-09 DIAGNOSIS — R079 Chest pain, unspecified: Secondary | ICD-10-CM | POA: Diagnosis present

## 2024-02-09 DIAGNOSIS — E785 Hyperlipidemia, unspecified: Secondary | ICD-10-CM | POA: Diagnosis not present

## 2024-02-09 DIAGNOSIS — E782 Mixed hyperlipidemia: Secondary | ICD-10-CM

## 2024-02-09 DIAGNOSIS — Z7982 Long term (current) use of aspirin: Secondary | ICD-10-CM | POA: Diagnosis not present

## 2024-02-09 DIAGNOSIS — J45909 Unspecified asthma, uncomplicated: Secondary | ICD-10-CM | POA: Insufficient documentation

## 2024-02-09 DIAGNOSIS — I259 Chronic ischemic heart disease, unspecified: Principal | ICD-10-CM

## 2024-02-09 LAB — CBC WITH DIFFERENTIAL/PLATELET
Abs Immature Granulocytes: 0.03 K/uL (ref 0.00–0.07)
Basophils Absolute: 0 K/uL (ref 0.0–0.1)
Basophils Relative: 0 %
Eosinophils Absolute: 0.1 K/uL (ref 0.0–0.5)
Eosinophils Relative: 1 %
HCT: 37 % (ref 36.0–46.0)
Hemoglobin: 12.1 g/dL (ref 12.0–15.0)
Immature Granulocytes: 0 %
Lymphocytes Relative: 28 %
Lymphs Abs: 2.8 K/uL (ref 0.7–4.0)
MCH: 26.1 pg (ref 26.0–34.0)
MCHC: 32.7 g/dL (ref 30.0–36.0)
MCV: 79.7 fL — ABNORMAL LOW (ref 80.0–100.0)
Monocytes Absolute: 0.6 K/uL (ref 0.1–1.0)
Monocytes Relative: 6 %
Neutro Abs: 6.5 K/uL (ref 1.7–7.7)
Neutrophils Relative %: 65 %
Platelets: 245 K/uL (ref 150–400)
RBC: 4.64 MIL/uL (ref 3.87–5.11)
RDW: 16.1 % — ABNORMAL HIGH (ref 11.5–15.5)
WBC: 9.9 K/uL (ref 4.0–10.5)
nRBC: 0 % (ref 0.0–0.2)

## 2024-02-09 LAB — COMPREHENSIVE METABOLIC PANEL WITH GFR
ALT: 15 U/L (ref 0–44)
AST: 22 U/L (ref 15–41)
Albumin: 3.4 g/dL — ABNORMAL LOW (ref 3.5–5.0)
Alkaline Phosphatase: 60 U/L (ref 38–126)
Anion gap: 9 (ref 5–15)
BUN: 19 mg/dL (ref 8–23)
CO2: 24 mmol/L (ref 22–32)
Calcium: 9 mg/dL (ref 8.9–10.3)
Chloride: 103 mmol/L (ref 98–111)
Creatinine, Ser: 0.7 mg/dL (ref 0.44–1.00)
GFR, Estimated: >60 mL/min (ref >60)
Glucose, Bld: 144 mg/dL — ABNORMAL HIGH (ref 70–99)
Potassium: 3.5 mmol/L (ref 3.5–5.1)
Sodium: 136 mmol/L (ref 135–145)
Total Bilirubin: 0.6 mg/dL (ref 0.0–1.2)
Total Protein: 7.3 g/dL (ref 6.5–8.1)

## 2024-02-09 LAB — PROTIME-INR
INR: 1.1 (ref 0.8–1.2)
Prothrombin Time: 14.8 s (ref 11.4–15.2)

## 2024-02-09 LAB — APTT: aPTT: 34 s (ref 24–36)

## 2024-02-09 LAB — BRAIN NATRIURETIC PEPTIDE: B Natriuretic Peptide: 99.1 pg/mL (ref 0.0–100.0)

## 2024-02-09 LAB — TROPONIN I (HIGH SENSITIVITY): Troponin I (High Sensitivity): 178 ng/L (ref <18)

## 2024-02-09 MED ORDER — ALPRAZOLAM 0.25 MG PO TABS
0.2500 mg | ORAL_TABLET | Freq: Two times a day (BID) | ORAL | Status: DC | PRN
  Administered 2024-02-10 – 2024-02-14 (×3): 0.25 mg via ORAL
  Filled 2024-02-09 (×3): qty 1

## 2024-02-09 MED ORDER — FAMOTIDINE 20 MG PO TABS
20.0000 mg | ORAL_TABLET | Freq: Two times a day (BID) | ORAL | Status: DC
  Administered 2024-02-10 – 2024-02-13 (×10): 20 mg via ORAL
  Filled 2024-02-09 (×8): qty 1

## 2024-02-09 MED ORDER — FUROSEMIDE 40 MG PO TABS
40.0000 mg | ORAL_TABLET | Freq: Two times a day (BID) | ORAL | Status: DC
  Administered 2024-02-10 (×2): 40 mg via ORAL
  Filled 2024-02-09 (×2): qty 1

## 2024-02-09 MED ORDER — TIRZEPATIDE 2.5 MG/0.5ML ~~LOC~~ SOAJ: 2.5000 mg | SUBCUTANEOUS | Status: DC

## 2024-02-09 MED ORDER — SODIUM CHLORIDE 0.9 % IV SOLN: INTRAVENOUS | Status: AC

## 2024-02-09 MED ORDER — CLOPIDOGREL BISULFATE 75 MG PO TABS: 75.0000 mg | ORAL_TABLET | Freq: Every day | ORAL | Status: DC

## 2024-02-09 MED ORDER — ACETAMINOPHEN 325 MG PO TABS
650.0000 mg | ORAL_TABLET | ORAL | Status: DC | PRN
  Administered 2024-02-11 – 2024-02-12 (×2): 650 mg via ORAL
  Filled 2024-02-09 (×2): qty 2

## 2024-02-09 MED ORDER — HEPARIN BOLUS VIA INFUSION
4000.0000 [IU] | Freq: Once | INTRAVENOUS | Status: AC
  Administered 2024-02-10: 4000 [IU] via INTRAVENOUS
  Filled 2024-02-09: qty 4000

## 2024-02-09 MED ORDER — LOSARTAN POTASSIUM 50 MG PO TABS
100.0000 mg | ORAL_TABLET | Freq: Every day | ORAL | Status: DC
  Administered 2024-02-10: 100 mg via ORAL
  Filled 2024-02-09: qty 2

## 2024-02-09 MED ORDER — ONDANSETRON HCL 4 MG/2ML IJ SOLN
4.0000 mg | Freq: Four times a day (QID) | INTRAMUSCULAR | Status: DC | PRN
  Administered 2024-02-10 – 2024-02-12 (×2): 4 mg via INTRAVENOUS
  Filled 2024-02-09 (×2): qty 2

## 2024-02-09 MED ORDER — METOPROLOL SUCCINATE ER 100 MG PO TB24
100.0000 mg | ORAL_TABLET | Freq: Every day | ORAL | Status: DC
  Administered 2024-02-10 – 2024-02-14 (×7): 100 mg via ORAL
  Filled 2024-02-09 (×5): qty 2

## 2024-02-09 MED ORDER — HYDRALAZINE HCL 10 MG PO TABS
10.0000 mg | ORAL_TABLET | Freq: Three times a day (TID) | ORAL | Status: DC
  Administered 2024-02-10 – 2024-02-13 (×11): 10 mg via ORAL
  Filled 2024-02-09 (×10): qty 1

## 2024-02-09 MED ORDER — CYCLOBENZAPRINE HCL 10 MG PO TABS: 10.0000 mg | ORAL_TABLET | Freq: Every day | ORAL | Status: DC

## 2024-02-09 MED ORDER — IPRATROPIUM-ALBUTEROL 0.5-2.5 (3) MG/3ML IN SOLN: 3.0000 mL | Freq: Four times a day (QID) | RESPIRATORY_TRACT | Status: DC | PRN

## 2024-02-09 MED ORDER — MAGNESIUM HYDROXIDE 400 MG/5ML PO SUSP: 30.0000 mL | Freq: Every day | ORAL | Status: DC | PRN

## 2024-02-09 MED ORDER — ASPIRIN 300 MG RE SUPP: 300.0000 mg | RECTAL | Status: AC

## 2024-02-09 MED ORDER — ZOLPIDEM TARTRATE 5 MG PO TABS
5.0000 mg | ORAL_TABLET | Freq: Every evening | ORAL | Status: DC | PRN
  Administered 2024-02-10 – 2024-02-13 (×6): 5 mg via ORAL
  Filled 2024-02-09 (×5): qty 1

## 2024-02-09 MED ORDER — OXYBUTYNIN CHLORIDE 5 MG PO TABS
5.0000 mg | ORAL_TABLET | Freq: Every day | ORAL | Status: DC
Start: 2024-02-10 — End: 2024-02-15
  Administered 2024-02-10 – 2024-02-13 (×5): 5 mg via ORAL
  Filled 2024-02-09 (×5): qty 1

## 2024-02-09 MED ORDER — AMLODIPINE BESYLATE 5 MG PO TABS: 10.0000 mg | ORAL_TABLET | Freq: Every day | ORAL | Status: DC

## 2024-02-09 MED ORDER — NITROGLYCERIN 0.4 MG SL SUBL
0.4000 mg | SUBLINGUAL_TABLET | SUBLINGUAL | Status: DC | PRN
  Administered 2024-02-10 – 2024-02-12 (×7): 0.4 mg via SUBLINGUAL
  Filled 2024-02-09 (×8): qty 1

## 2024-02-09 MED ORDER — ASPIRIN 81 MG PO TBEC
81.0000 mg | DELAYED_RELEASE_TABLET | Freq: Every day | ORAL | Status: DC
Start: 2024-02-10 — End: 2024-02-15
  Administered 2024-02-10 – 2024-02-14 (×7): 81 mg via ORAL
  Filled 2024-02-09 (×5): qty 1

## 2024-02-09 MED ORDER — ESCITALOPRAM OXALATE 10 MG PO TABS
10.0000 mg | ORAL_TABLET | Freq: Every day | ORAL | Status: DC
Start: 2024-02-10 — End: 2024-02-15
  Administered 2024-02-10 – 2024-02-13 (×5): 10 mg via ORAL
  Filled 2024-02-09 (×4): qty 1

## 2024-02-09 MED ORDER — MORPHINE SULFATE (PF) 2 MG/ML IV SOLN
2.0000 mg | Freq: Once | INTRAVENOUS | Status: AC
  Administered 2024-02-09: 2 mg via INTRAVENOUS
  Filled 2024-02-09: qty 1

## 2024-02-09 MED ORDER — HEPARIN (PORCINE) 25000 UT/250ML-% IV SOLN
1100.0000 [IU]/h | INTRAVENOUS | Status: AC
  Administered 2024-02-10 – 2024-02-11 (×3): 1100 [IU]/h via INTRAVENOUS
  Filled 2024-02-09 (×3): qty 250

## 2024-02-09 MED ORDER — ATORVASTATIN CALCIUM 20 MG PO TABS
40.0000 mg | ORAL_TABLET | Freq: Every day | ORAL | Status: DC
  Administered 2024-02-10: 40 mg via ORAL
  Filled 2024-02-09: qty 2

## 2024-02-09 MED ORDER — MONTELUKAST SODIUM 10 MG PO TABS
10.0000 mg | ORAL_TABLET | Freq: Every day | ORAL | Status: DC
  Administered 2024-02-10 – 2024-02-13 (×5): 10 mg via ORAL
  Filled 2024-02-09 (×4): qty 1

## 2024-02-09 MED ORDER — ISOSORBIDE MONONITRATE ER 60 MG PO TB24
120.0000 mg | ORAL_TABLET | Freq: Every day | ORAL | Status: DC
  Administered 2024-02-10: 120 mg via ORAL
  Filled 2024-02-09: qty 2

## 2024-02-09 MED ORDER — RANOLAZINE ER 500 MG PO TB12
1000.0000 mg | ORAL_TABLET | Freq: Two times a day (BID) | ORAL | Status: DC
  Administered 2024-02-10 – 2024-02-13 (×10): 1000 mg via ORAL
  Filled 2024-02-09 (×9): qty 2

## 2024-02-09 MED ORDER — ASPIRIN 81 MG PO CHEW
324.0000 mg | CHEWABLE_TABLET | ORAL | Status: AC
  Administered 2024-02-10: 324 mg via ORAL
  Filled 2024-02-09: qty 4

## 2024-02-09 MED ORDER — EMPAGLIFLOZIN 25 MG PO TABS
25.0000 mg | ORAL_TABLET | Freq: Every day | ORAL | Status: DC
  Administered 2024-02-10 – 2024-02-13 (×5): 25 mg via ORAL
  Filled 2024-02-09 (×5): qty 1

## 2024-02-09 NOTE — Assessment & Plan Note (Signed)
-   Will continue Lexapro. 

## 2024-02-09 NOTE — H&P (Signed)
 [place]   PATIENT NAME: Felicia Woods    MR#:  [id number]  DATE OF BIRTH:  1955-07-28  DATE OF ADMISSION:  02/09/2024  PRIMARY CARE PHYSICIAN: Kristina Tinnie POUR, PA-C   Patient is coming from: Home  REQUESTING/REFERRING PHYSICIAN: Ward, Josette HERO, DO  CHIEF COMPLAINT:   Chief Complaint  Patient presents with   Chest Pain    HISTORY OF PRESENT ILLNESS:  Felicia Woods is a 68 y.o. female with medical history significant for coronary artery disease status post PCIs and 7 stents, type 2 diabetes mellitus, dyslipidemia, hypertension, asthma and migraine, who presented to the emergency room with acute onset of left upper chest pain with radiation to her right shoulder and associated numbness in the right shoulder and arm.  She has been started about an hour before he came to the ER.  She felt similar to his previous MI.  She took sublingual nitroglycerin  and aspirin  with some relief of his pain.  No cough or wheezing or hemoptysis.  No leg pain or edema or recent travels or surgeries.  No fever or chills.  No dysuria, oliguria or hematuria or flank pain.  No other paresthesias or focal muscle weakness.  ED Course: When he came to the ER, BP was 149/85 with otherwise normal vital signs.  Labs revealed borderline potassium of 3.5 and blood glucose 144, albumin 3.4 with otherwise unremarkable CMP.  High-sensitivity troponin I was 178 and BNP 99.1.  CBC was unremarkable. EKG as reviewed by me :  EKG showed sinus rhythm with rate of 76 with right bundle branch block and LVH with T wave inversion anteroseptally and Q waves inferiorly. Imaging: Portable chest x-ray showed bibasilar opacities favoring atelectasis.  The patient was given IV heparin  bolus and drip and 2 mg of IV morphine  sulfate.  Dr. Lera was notified and is aware about the patient.  She be admitted to a progressive unit bed for further evaluation and management. PAST MEDICAL HISTORY:   Past Medical History:   Diagnosis Date   Asthma    Coronary artery disease    Diabetes mellitus without complication (HCC)    Heart attack (HCC)    Hyperlipidemia    Hypertension    MI (myocardial infarction) (HCC)    Migraine headache with aura    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    PAST SURGICAL HISTORY:   Past Surgical History:  Procedure Laterality Date   ABDOMINAL HYSTERECTOMY     CARDIAC CATHETERIZATION     CARDIAC CATHETERIZATION N/A 10/18/2015   Procedure: Left Heart Cath and Cors/Grafts Angiography;  Surgeon: Dorn JINNY Lesches, MD;  Location: [facility];  Service: Cardiovascular;  Laterality: N/A;   CARDIAC CATHETERIZATION N/A 10/18/2015   Procedure: Coronary Stent Intervention;  Surgeon: Dorn JINNY Lesches, MD;  Location: [facility];  Service: Cardiovascular;  Laterality: N/A;   CARDIAC SURGERY     CHOLECYSTECTOMY     COLONOSCOPY WITH PROPOFOL  N/A 12/02/2021   Procedure: COLONOSCOPY WITH PROPOFOL ;  Surgeon: Therisa Bi, MD;  Location: [facility] ENDOSCOPY;  Service: Gastroenterology;  Laterality: N/A;   CORONARY ANGIOPLASTY     CORONARY ARTERY BYPASS GRAFT     4 vessels - 2010   CORONARY STENT INTERVENTION N/A 02/16/2017   Procedure: CORONARY STENT INTERVENTION;  Surgeon: Florencio Cara BIRCH, MD;  Location: [facility] INVASIVE CV LAB;  Service: Cardiovascular;  Laterality: N/A;   CORONARY STENT INTERVENTION N/A 02/13/2019   Procedure: CORONARY STENT INTERVENTION;  Surgeon:  End, Lonni, MD;  Location: [facility] INVASIVE CV [facility];  Service: Cardiovascular;  Laterality: N/A;  SVG to RCA   CORONARY STENT INTERVENTION Left 03/08/2019   Procedure: CORONARY STENT INTERVENTION;  Surgeon: Florencio Cara BIRCH, MD;  Location: [facility] INV[facility] CV [facility];  Service: Cardiovascular;  Laterality: Left;   CORONARY STENT INTERVENTION N/A 11/04/2020   Procedure: CORONARY STENT INTERVENTION;  Surgeon: Mady Lonni, MD;  Location: [facility] INVASIVE CV [facility];  Service: Cardiovascular;  Laterality: N/A;   CORONARY STENT  INTERVENTION N/A 12/24/2021   Procedure: CORONARY STENT INTERVENTION;  Surgeon: Lawyer Bernardino Cough, MD;  Location: [facility] INVASIVE CV LAB;  Service: Cardiovascular;  Laterality: N/A;   ESOPHAGOGASTRODUODENOSCOPY N/A 12/02/2021   Procedure: ESOPHAGOGASTRODUODENOSCOPY (EGD);  Surgeon: Therisa Bi, MD;  Location: [facility] ENDOSCOPY;  Service: Gastroenterology;  Laterality: N/A;   LEFT HEART CATH AND CORONARY ANGIOGRAPHY Left 02/16/2017   Procedure: LEFT HEART CATH AND CORONARY ANGIOGRAPHY;  Surgeon: Hester Wolm PARAS, MD;  Location: [facility] INV[facility] CV [facility];  Service: Cardiovascular;  Laterality: Left;   LEFT HEART CATH AND CORONARY ANGIOGRAPHY Left 12/24/2021   Procedure: LEFT HEART CATH AND CORONARY ANGIOGRAPHY;  Surgeon: Hester Wolm PARAS, MD;  Location: [facility] INVASIVE CV LAB;  Service: Cardiovascular;  Laterality: Left;   LEFT HEART CATH AND CORONARY ANGIOGRAPHY Left 06/17/2022   Procedure: LEFT HEART CATH AND CORONARY ANGIOGRAPHY;  Surgeon: Florencio Cara BIRCH, MD;  Location: [facility] INV[facility]IVE CV [facility];  Service: Cardiovascular;  Laterality: Left;   LEFT HEART CATH AND CORS/GRAFTS ANGIOGRAPHY Left 11/23/2017   Procedure: LEFT HEART CATH AND CORS/GRAFTS ANGIOGRAPHY;  Surgeon: Hester Wolm PARAS, MD;  Location: [facility] INV[facility] CV [facility];  Service: Cardiovascular;  Laterality: Left;   LEFT HEART CATH AND CORS/GRAFTS ANGIOGRAPHY N/A 02/13/2019   Procedure: LEFT HEART CATH AND CORS/GRAFTS ANGIOGRAPHY;  Surgeon: Hester Wolm PARAS, MD;  Location: [facility] INV[facility];  Service: Cardiovascular;  Laterality: N/A;   LEFT HEART CATH AND CORS/GRAFTS ANGIOGRAPHY N/A 07/03/2019   Procedure: LEFT HEART CATH AND CORS/GRAFTS ANGIOGRAPHY;  Surgeon: Hester Wolm PARAS, MD;  Location: [facility] IN[facility];  Service: Cardiovascular;  Laterality: N/A;   LEFT HEART CATH AND CORS/GRAFTS ANGIOGRAPHY N/A 11/04/2020   Procedure: LEFT HEART CATH AND CORS/GRAFTS ANGIOGRAPHY;  Surgeon: Hester Wolm PARAS, MD;  Location: [facility] INV[facility]IVE CV [facility];  Service:  Cardiovascular;  Laterality: N/A;    SOCIAL HISTORY:   Social History   Tobacco Use   Smoking status: Never   Smokeless tobacco: Never  Substance Use Topics   Alcohol use: No    Alcohol/week: 0.0 standard drinks of alcohol    FAMILY HISTORY:   Family History  Problem Relation Age of Onset   Diabetes Mother    Diabetes Father    Cancer Father    Diabetes Brother     DRUG ALLERGIES:   Allergies  Allergen Reactions   Penicillin G Anaphylaxis and Other (See Comments)    Has patient had a PCN reaction causing immediate rash, facial/tongue/throat swelling, SOB or lightheadedness with hypotension: [id number]} Has patient had a PCN reaction causing severe rash involving mucus membranes or skin necrosis: no:[id number]} Has patient had a PCN reaction that required hospitalization no:[id number]} Has patient had a PCN reaction occurring within the last 10 years: no:[id number]} If all of the above answers are NO, then may proceed with Cephalosporin use.   Penicillins     Other reaction(s): Not available    REVIEW OF SYSTEMS:   ROS As per history of present illness. All pertinent systems were reviewed above. Constitutional, HEENT,  cardiovascular, respiratory, GI, GU, musculoskeletal, neuro, psychiatric, endocrine, integumentary and hematologic systems were reviewed and are otherwise negative/unremarkable except for positive findings mentioned above in the HPI.   MEDICATIONS AT HOME:   Prior to Admission medications   Medication Sig Start Date End Date Taking? Authorizing Provider  amLODipine  (NORVASC ) 10 MG tablet Take 1 tablet by mouth daily.    [provider]  aspirin  EC 81 MG tablet Take 81 mg by mouth daily.    [provider]  atorvastatin  (LIPITOR ) 40 MG tablet Take 1 tablet (40 mg total) by mouth daily. 11/24/23   McDonough, Tinnie POUR, PA-C  Blood Glucose Monitoring Suppl (ONETOUCH VERIO FLEX SYSTEM) w/Device KIT Use as directed twice a daily DX E11.65  07/22/22   McDonough, Tinnie POUR, PA-C  Budeson-Glycopyrrol-Formoterol  (BREZTRI  AEROSPHERE) 160-9-4.8 MCG/ACT AERO Inhale 2 puffs into the lungs 2 (two) times daily. 04/14/23   McDonough, Lauren K, PA-C  clopidogrel  (PLAVIX ) 75 MG tablet Take 1 tablet by mouth daily.    [provider]  cyclobenzaprine  (FLEXERIL ) 10 MG tablet Take 1 tablet by mouth at bedtime.    [provider]  empagliflozin  (JARDIANCE ) 25 MG TABS tablet Take one tab a day for diabetes 11/24/23   McDonough, Lauren K, PA-C  escitalopram  (LEXAPRO ) 10 MG tablet Take 1 tablet (10 mg total) by mouth daily. 11/24/23   McDonough, Tinnie POUR, PA-C  famotidine  (PEPCID ) 20 MG tablet Take 1 tablet (20 mg total) by mouth 2 (two) times daily. 11/24/23   McDonough, Tinnie POUR, PA-C  fluconazole  (DIFLUCAN ) 150 MG tablet Take 1 tablet then can take another tablet in 3 days if symptoms are still present. 11/24/23   McDonough, Tinnie POUR, PA-C  furosemide  (LASIX ) 40 MG tablet Take 1 tablet (40 mg total) by mouth 2 (two) times daily. 09/05/23   McDonough, Tinnie POUR, PA-C  glucose blood (ONETOUCH VERIO) test strip Use as instructed twice daily DX E11.65 11/24/23   McDonough, Lauren K, PA-C  hydrALAZINE  (APRESOLINE ) 10 MG tablet Take 1 tablet (10 mg total) by mouth 3 (three) times daily. 11/24/23   McDonough, Lauren K, PA-C  isosorbide  mononitrate (IMDUR ) 120 MG 24 hr tablet Take 1 tablet by mouth daily. 06/23/22   [provider]  losartan  (COZAAR ) 100 MG tablet Take 1 tablet (100 mg total) by mouth daily. 11/24/23   McDonough, Tinnie POUR, PA-C  metoprolol  succinate (TOPROL -XL) 100 MG 24 hr tablet Take 100 mg by mouth daily. 08/04/22   [provider]  Misc. Devices (ADJUST BATH/SHOWER SEAT) MISC 1 shower seat for use at home to reduce risk of falling. 01/13/22   Liana Fish, NP  montelukast  (SINGULAIR ) 10 MG tablet Take 1 tablet (10 mg total) by mouth at bedtime. 11/24/23   McDonough, Tinnie POUR, PA-C  nitroGLYCERIN  (NITRODUR - DOSED  IN MG/24 HR) 0.1 mg/hr patch Place 0.1 mg onto the skin daily.    [provider]  nitroGLYCERIN  (NITROSTAT ) 0.4 MG SL tablet DISSOLVE ONE TABLET UNDER THE TONGUE EVERY 5 MINUTES AS NEEDED FOR CHEST PAIN.  DO NOT EXCEED A TOTAL OF 3 DOSES IN 15 MINUTES 04/12/21   Khan, Fozia M, MD  nystatin  (MYCOSTATIN ) 100000 UNIT/ML suspension Take 5 mLs (500,000 Units total) by mouth daily. Swish and spit 02/14/23   McDonough, Tinnie POUR, PA-C  OneTouch Delica Lancets 30G MISC USE 1  TO CHECK GLUCOSE TWICE DAILY AS DIRECTED 05/11/23   McDonough, Lauren K, PA-C  oxybutynin  (DITROPAN ) 5 MG tablet Take 1 tablet (5 mg total)  by mouth daily. 11/24/23   McDonough, Tinnie POUR, PA-C  PROAIR  RESPICLICK 108 (90 Base) MCG/ACT AEPB Inhale 2 puffs into the lungs every 6 (six) hours as needed. 07/22/22   McDonough, Tinnie POUR, PA-C  ranolazine  (RANEXA ) 1000 MG SR tablet Take 1 tablet (1,000 mg total) by mouth 2 (two) times daily. 04/08/22   Patel, Sona, MD  tirzepatide  (MOUNJARO ) 2.5 MG/0.5ML Pen Inject 2.5 mg into the skin once a week. 11/24/23   McDonough, Tinnie POUR, PA-C  zolpidem  (AMBIEN ) 5 MG tablet Take 1 tablet (5 mg total) by mouth at bedtime as needed. for sleep 11/24/23   McDonough, Tinnie POUR, PA-C      VITAL SIGNS:  Blood pressure (!) 150/81, pulse 64, temperature 98.2 F (36.8 C), temperature source Oral, resp. rate 20, height 5' 7 (1.702 m), weight 101.2 kg, SpO2 97%.  PHYSICAL EXAMINATION:  Physical Exam  GENERAL:  68 y.o.-year-old patient lying in the bed with no acute distress.  EYES: Pupils equal, round, reactive to light and accommodation. No scleral icterus. Extraocular muscles intact.  HEENT: Head atraumatic, normocephalic. Oropharynx and nasopharynx clear.  NECK:  Supple, no jugular venous distention. No thyroid  enlargement, no tenderness.  LUNGS: Normal breath sounds bilaterally, no wheezing, rales,rhonchi or crepitation. No use of accessory muscles of respiration.  CARDIOVASCULAR: Regular rate and  rhythm, S1, S2 normal. No murmurs, rubs, or gallops.  ABDOMEN: Soft, nondistended, nontender. Bowel sounds present. No organomegaly or mass.  EXTREMITIES: No pedal edema, cyanosis, or clubbing.  NEUROLOGIC: Cranial nerves II through XII are intact. Muscle strength 5/5 in all extremities. Sensation intact. Gait not checked.  PSYCHIATRIC: The patient is alert and oriented x 3.  Normal affect and good eye contact. SKIN: No obvious rash, lesion, or ulcer.   LABORATORY PANEL:   CBC Recent Labs  Lab 02/09/24 2201  WBC 9.9  HGB 12.1  HCT 37.0  PLT 245   ------------------------------------------------------------------------------------------------------------------  Chemistries  Recent Labs  Lab 02/09/24 2201  NA 136  K 3.5  CL 103  CO2 24  GLUCOSE 144*  BUN 19  CREATININE 0.70  CALCIUM  9.0  AST 22  ALT 15  ALKPHOS 60  BILITOT 0.6   ------------------------------------------------------------------------------------------------------------------  Cardiac Enzymes No results for input(s): TROPONINI in the last 168 hours. ------------------------------------------------------------------------------------------------------------------  RADIOLOGY:  DG Chest Port 1 View Result Date: 02/09/2024 CLINICAL DATA:  Chest pain EXAM: PORTABLE CHEST 1 VIEW COMPARISON:  04/06/2022 FINDINGS: Stable cardiomediastinal silhouette. Aortic atherosclerotic calcification. Sternotomy. Bibasilar airspace opacities favor atelectasis. No pleural effusion or pneumothorax. IMPRESSION: Bibasilar airspace opacities favor atelectasis. Electronically Signed   By: Norman Gatlin M.D.   On: 02/09/2024 22:14      IMPRESSION AND PLAN:  Assessment and Plan: * NSTEMI (non-ST elevated myocardial infarction) [facility]) - The patient will be admitted to a progressive unit bed. - Will continue her on IV heparin . - Will place on aspirin  and Plavix  and as needed sublingual nitroglycerin  and morphine  sulfate for  pain. - Will continue Ranexa  and Imdur . - Will continue beta-blocker therapy with Toprol -XL. - Will place on high-dose statin therapy with Lipitor . - 2D echo and cardiology consult will be obtained. - Dr. Florencio was notified about the patient.  Essential hypertension - Will continue antihypertensive therapy.  Dyslipidemia - Will continue statin therapy with high-dose and check fasting lipids.  Asthma, chronic - Will continue her inhalers.  GERD without esophagitis - Will continue H2 blocker therapy.  Depression - Will continue Lexapro .   DVT prophylaxis: IV heparin . [facility]  Planning:  Code Status: full code.  Family Communication:  The plan of care was discussed in details with the patient (and family). I answered all questions. The patient agreed to proceed with the above mentioned plan. Further management will depend upon hospital course. Disposition Plan: Back to previous home environment Consults called: Cardiology. All the records are reviewed and case discussed with ED provider.  Status is: Inpatient  At the time of the admission, it appears that the appropriate admission status for this patient is inpatient.  This is judged to be reasonable and necessary in order to provide the required intensity of service to ensure the patient's safety given the presenting symptoms, physical exam findings and initial radiographic and laboratory data in the context of comorbid conditions.  The patient requires inpatient status due to high intensity of service, high risk of further deterioration and high frequency of surveillance required.  I certify that at the time of admission, it is my clinical judgment that the patient will require inpatient hospital care extending more than 2 midnights.                            Dispo: The patient is from: Home              Anticipated d/c is to: Home              Patient currently is not medically stable to d/c.              Difficult to  place patient: No  Madison DELENA Peaches M.D on 02/09/2024 at 11:56 PM  [facility]   From 7 PM-7 AM, contact night-coverage [email or web address]  CC: Primary care physician; Kristina Tinnie POUR, PA-C

## 2024-02-09 NOTE — Assessment & Plan Note (Signed)
Will continue H2 blocker therapy

## 2024-02-09 NOTE — Assessment & Plan Note (Signed)
-   Will continue statin therapy with high-dose and check fasting lipids.

## 2024-02-09 NOTE — Assessment & Plan Note (Signed)
-   Will continue her inhalers.

## 2024-02-09 NOTE — ED Provider Notes (Signed)
 [facility] Provider Note   Event Date/Time   First MD Initiated Contact with Patient 02/09/24 2150     (approximate) History  Chest Pain  HPI Felicia Woods is a 68 y.o. female with past medical history of hypertension, type 2 diabetes, hyperlipidemia, and CAD with reported 7 stents who presents via EMS complaining of left upper chest wall pain radiating into the right shoulder with associated numbness in the right shoulder and arm.  Patient denies any numbness radiating down to the forearm or hand.  Patient states that this pain started approximately 1 hour prior to arrival and it feels similar to previous MI that she has had in the past.  Patient has had nitroglycerin  and aspirin  with little relief in her pain. ROS: Patient currently denies any vision changes, tinnitus, difficulty speaking, facial droop, sore throat, shortness of breath, abdominal pain, nausea/vomiting/diarrhea, dysuria, or weakness/numbness/paresthesias in any extremity   Physical Exam  Triage Vital Signs: ED Triage Vitals [02/09/24 2153]  Encounter Vitals Group     BP      Girls Systolic BP Percentile      Girls Diastolic BP Percentile      Boys Systolic BP Percentile      Boys Diastolic BP Percentile      Pulse      Resp      Temp      Temp src      SpO2      Weight 223 lb (101.2 kg)     Height 5' 7 (1.702 m)     Head Circumference      Peak Flow      Pain Score      Pain Loc      Pain Education      Exclude from Growth Chart    Most recent vital signs: Vitals:   02/09/24 2200 02/09/24 2232  BP: (!) 165/84   Pulse: 74   Resp: (!) 29   Temp:  98.2 F (36.8 C)  SpO2: 97%    General: Awake, oriented x4. CV:  Good peripheral perfusion. Resp:  Normal effort. Abd:  No distention. Other:  Middle-aged obese African-American female resting comfortably in no acute distress ED Results / Procedures / Treatments  Labs (all labs ordered are listed, but only abnormal results are  displayed) Labs Reviewed  COMPREHENSIVE METABOLIC PANEL WITH GFR - Abnormal; Notable for the following components:      Result Value   Glucose, Bld 144 (*)    Albumin 3.4 (*)    All other components within normal limits  CBC WITH DIFFERENTIAL/PLATELET - Abnormal; Notable for the following components:   MCV 79.7 (*)    RDW 16.1 (*)    All other components within normal limits  TROPONIN I (HIGH SENSITIVITY) - Abnormal; Notable for the following components:   Troponin I (High Sensitivity) 178 (*)    All other components within normal limits  BRAIN NATRIURETIC PEPTIDE  APTT  PROTIME-INR   EKG ED ECG REPORT I, Artist MARLA Kerns, the attending physician, personally viewed and interpreted this ECG. Date: 02/09/2024 EKG Time: 2157 Rate: 76 Rhythm: normal sinus rhythm QRS Axis: normal Intervals: Right bundle branch block ST/T Wave abnormalities: normal Narrative Interpretation: Normal sinus rhythm with right bundle branch block.  No evidence of acute ischemia RADIOLOGY ED MD interpretation: Single view portable chest x-ray shows bibasilar airspace opacities favoring atelectasis - All radiology independently interpreted and agree with radiology assessment Official radiology report(s): DG Chest Port 1  View Result Date: 02/09/2024 CLINICAL DATA:  Chest pain EXAM: PORTABLE CHEST 1 VIEW COMPARISON:  04/06/2022 FINDINGS: Stable cardiomediastinal silhouette. Aortic atherosclerotic calcification. Sternotomy. Bibasilar airspace opacities favor atelectasis. No pleural effusion or pneumothorax. IMPRESSION: Bibasilar airspace opacities favor atelectasis. Electronically Signed   By: Norman Gatlin M.D.   On: 02/09/2024 22:14   PROCEDURES: Critical Care performed: No Procedures MEDICATIONS ORDERED IN ED: Medications  morphine  (PF) 2 MG/ML injection 2 mg (2 mg Intravenous Given 02/09/24 2230)   IMPRESSION / MDM / ASSESSMENT AND PLAN / ED COURSE  I reviewed the triage vital signs and the nursing  notes.                             The patient is on the cardiac monitor to evaluate for evidence of arrhythmia and/or significant heart rate changes. Patient's presentation is most consistent with acute presentation with potential threat to life or bodily function. Workup: ECG, CXR, CBC, BMP, Troponin Findings: ECG: No overt evidence of STEMI. No evidence of Brugada's sign, delta wave, epsilon wave, significantly prolonged QTc, or malignant arrhythmia HS Troponin: Negative x1 Other Labs unremarkable for emergent problems. CXR: Without PTX, PNA, or widened mediastinum Last Stress Test:  2023 Last Heart Catheterization:  2023 HEART Score: 6  Given History, Exam, and Workup I have low suspicion for ACS, Pneumothorax, Pneumonia, Pulmonary Embolus, Tamponade, Aortic Dissection or other emergent problem as a cause for this presentation.   High Risk Chest Pain Patient at increased risk for Major Adverse Cardiac Event (AMI, PCI, CABG, death) Interventions: ASA 324mg  Defer Heparin  drip as patient pain free at this time,   Disposition: Admit for continued cardiac monitoring and trending of troponins as well as further evaluation for potential inpatient stress testing vs cardiac catheterization and coronary angiography.   FINAL CLINICAL IMPRESSION(S) / ED DIAGNOSES   Final diagnoses:  Chest pain due to myocardial ischemia, unspecified ischemic chest pain type  NSTEMI (non-ST elevated myocardial infarction) (HCC)   Rx / DC Orders   ED Discharge Orders     None      Note:  This document was prepared using Dragon voice recognition software and may include unintentional dictation errors.   Jossie Artist POUR, MD 02/09/24 [phone]

## 2024-02-09 NOTE — Assessment & Plan Note (Addendum)
-   The patient will be admitted to a progressive unit bed. - Will continue her on IV heparin . - Will place on aspirin  and Plavix  and as needed sublingual nitroglycerin  and morphine  sulfate for pain. - Will continue Ranexa  and Imdur . - Will continue beta-blocker therapy with Toprol -XL. - Will place on high-dose statin therapy with Lipitor . - 2D echo and cardiology consult will be obtained. - Dr. Florencio was notified about the patient.

## 2024-02-09 NOTE — ED Triage Notes (Signed)
 Patient C/O chest pain that began about 2000. Patient has extensive cardiac history including a CABG and seven stents.  Took two doses of nitroglycerin  at home with no relief. Patient also took 324 of aspirin  before arrival.

## 2024-02-09 NOTE — Progress Notes (Signed)
 ANTICOAGULATION CONSULT NOTE  Pharmacy Consult for heparin  infusion Indication: ACS/STEMI  Allergies  Allergen Reactions   Penicillin G Anaphylaxis and Other (See Comments)    Has patient had a PCN reaction causing immediate rash, facial/tongue/throat swelling, SOB or lightheadedness with hypotension: bzd:[id number]} Has patient had a PCN reaction causing severe rash involving mucus membranes or skin necrosis: no:[id number]} Has patient had a PCN reaction that required hospitalization no:[id number]} Has patient had a PCN reaction occurring within the last 10 years: no:[id number]} If all of the above answers are NO, then may proceed with Cephalosporin use.   Penicillins     Other reaction(s): Not available    Patient Measurements: Height: 5' 7 (170.2 cm) Weight: 101.2 kg (223 lb) IBW/kg (Calculated) : 61.6 HEPARIN  DW (KG): 84.2  Vital Signs: Temp: 98.2 F (36.8 C) (08/07 2232) Temp Source: Oral (08/07 2232) BP: 165/84 (08/07 2200) Pulse Rate: 74 (08/07 2200)  Labs: Recent Labs    02/09/24 2201  HGB 12.1  HCT 37.0  PLT 245  CREATININE 0.70  TROPONINIHS 178*    Estimated Creatinine Clearance: 84.5 mL/min (by C-G formula based on SCr of 0.7 mg/dL).   Medical History: Past Medical History:  Diagnosis Date   Asthma    Coronary artery disease    Diabetes mellitus without complication (HCC)    Heart attack (HCC)    Hyperlipidemia    Hypertension    MI (myocardial infarction) (HCC)    Migraine headache with aura    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    Assessment: Pt is a 68 yo female with extensive cardiac hx presenting to ED c/o CP, found with elevated Troponin I level.   Goal of Therapy:  Heparin  level 0.3-0.7 units/ml Monitor platelets by anticoagulation protocol: Yes   Plan:  Bolus 4000 units x 1 Start heparin  infusion at 1100 units/hr Will check HL in 6 hr after start of infusion CBC daily while on heparin   Rankin CANDIE Dills, PharmD, [facility] 02/09/2024 11:13  PM

## 2024-02-09 NOTE — Assessment & Plan Note (Signed)
Will continue antihypertensive therapy.

## 2024-02-10 ENCOUNTER — Encounter
Admission: EM | Disposition: A | Discharge status: Home / Self Care | Payer: Self-pay | Source: Home / Self Care | Attending: Emergency Medicine

## 2024-02-10 ENCOUNTER — Observation Stay: Admit: 2024-02-10 | Discharge: 2024-02-10 | Disposition: A | Discharge status: Home / Self Care

## 2024-02-10 DIAGNOSIS — E785 Hyperlipidemia, unspecified: Secondary | ICD-10-CM | POA: Diagnosis not present

## 2024-02-10 DIAGNOSIS — J45909 Unspecified asthma, uncomplicated: Secondary | ICD-10-CM | POA: Diagnosis not present

## 2024-02-10 DIAGNOSIS — I10 Essential (primary) hypertension: Secondary | ICD-10-CM | POA: Diagnosis not present

## 2024-02-10 DIAGNOSIS — I214 Non-ST elevation (NSTEMI) myocardial infarction: Secondary | ICD-10-CM | POA: Diagnosis not present

## 2024-02-10 LAB — TROPONIN I (HIGH SENSITIVITY)
Troponin I (High Sensitivity): 164 ng/L (ref <18)
Troponin I (High Sensitivity): 169 ng/L (ref <18)
Troponin I (High Sensitivity): 192 ng/L (ref <18)

## 2024-02-10 LAB — LIPID PANEL
Cholesterol: 203 mg/dL — ABNORMAL HIGH (ref 0–200)
HDL: 42 mg/dL (ref >40)
LDL Cholesterol: 147 mg/dL — ABNORMAL HIGH (ref 0–99)
Total CHOL/HDL Ratio: 4.8 ratio
Triglycerides: 70 mg/dL (ref <150)
VLDL: 14 mg/dL (ref 0–40)

## 2024-02-10 LAB — HEPARIN LEVEL (UNFRACTIONATED)
Heparin Unfractionated: 0.39 [IU]/mL (ref 0.30–0.70)
Heparin Unfractionated: 0.44 [IU]/mL (ref 0.30–0.70)

## 2024-02-10 LAB — HIV ANTIBODY (ROUTINE TESTING W REFLEX): HIV Screen 4th Generation wRfx: NONREACTIVE

## 2024-02-10 SURGERY — LEFT HEART CATH AND CORS/GRAFTS ANGIOGRAPHY
Anesthesia: Moderate Sedation

## 2024-02-10 MED ORDER — ATORVASTATIN CALCIUM 80 MG PO TABS
80.0000 mg | ORAL_TABLET | Freq: Every day | ORAL | Status: DC
  Administered 2024-02-11 – 2024-02-13 (×4): 80 mg via ORAL
  Filled 2024-02-10 (×3): qty 1

## 2024-02-10 MED ORDER — CLOPIDOGREL BISULFATE 75 MG PO TABS
75.0000 mg | ORAL_TABLET | Freq: Every day | ORAL | Status: DC
  Administered 2024-02-10 – 2024-02-14 (×7): 75 mg via ORAL
  Filled 2024-02-10 (×5): qty 1

## 2024-02-10 NOTE — Progress Notes (Signed)
 ANTICOAGULATION CONSULT NOTE  Pharmacy Consult for heparin  infusion Indication: ACS/STEMI  Allergies  Allergen Reactions   Penicillin G Anaphylaxis and Other (See Comments)    Has patient had a PCN reaction causing immediate rash, facial/tongue/throat swelling, SOB or lightheadedness with hypotension: bzd:[id number]} Has patient had a PCN reaction causing severe rash involving mucus membranes or skin necrosis: no:[id number]} Has patient had a PCN reaction that required hospitalization no:[id number]} Has patient had a PCN reaction occurring within the last 10 years: no:[id number]} If all of the above answers are NO, then may proceed with Cephalosporin use.   Penicillins     Other reaction(s): Not available    Patient Measurements: Height: 5' 7 (170.2 cm) Weight: 101.2 kg (223 lb) IBW/kg (Calculated) : 61.6 HEPARIN  DW (KG): 84.2  Vital Signs: Temp: 97.8 F (36.6 C) (08/08 0739) Temp Source: Oral (08/07 2232) BP: 132/85 (08/08 0739) Pulse Rate: 64 (08/08 0739)  Labs: Recent Labs    02/09/24 2201 02/09/24 2210 02/09/24 2359 02/10/24 0728  HGB 12.1  --   --   --   HCT 37.0  --   --   --   PLT 245  --   --   --   APTT  --  34  --   --   LABPROT  --  14.8  --   --   INR  --  1.1  --   --   HEPARINUNFRC  --   --   --  0.39  CREATININE 0.70  --   --   --   TROPONINIHS 178*  --  192*  --    Estimated Creatinine Clearance: 84.5 mL/min (by C-G formula based on SCr of 0.7 mg/dL).  Medical History: Past Medical History:  Diagnosis Date   Asthma    Coronary artery disease    Diabetes mellitus without complication (HCC)    Heart attack (HCC)    Hyperlipidemia    Hypertension    MI (myocardial infarction) (HCC)    Migraine headache with aura    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   Assessment: Pt is a 68 yo female with hypertension, type 2 diabetes, hyperlipidemia, and CAD with reported 7 stents presenting to ED c/o CP, found with elevated Troponin I level. Pharmacy consulted  to initiate and manage heparin  infusion for ACS.  Baseline CBC appropriate to initiate heparin  infusion. CrCl 84.23ml/min.  Date Time  Results Comments 8/8 0728 HL=0.39 Therapeutic x 1, rate 1100 u/h  Goal of Therapy:  Heparin  level 0.3-0.7 units/ml Monitor platelets by anticoagulation protocol: Yes   Plan:  HL therapeutic this morning  Continue heparin  infusion rate at 1100 units/hr Check HL in 6 hr to confirm therapeutic rate CBC daily while on heparin   Sadi Arave Rodriguez-Guzman PharmD, BCPS 02/10/2024 7:50 AM

## 2024-02-10 NOTE — Care Management CC44 (Cosign Needed)
 Condition Code 44 Documentation Completed  Patient Details  Name: Raevyn Sokol MRN: [id number] Date of Birth: 1956-04-10   Condition Code 44 given:    Patient signature on Condition Code 44 notice:    Documentation of 2 MD's agreement:    Code 44 added to claim:       Dalia GORMAN Fuse, RN 02/10/2024, 3:22 PM

## 2024-02-10 NOTE — Progress Notes (Signed)
 [facility]  - [facility]   PATIENT NAME: Felicia Woods    MR#:  [id number]  DATE OF BIRTH:  21-Nov-1955  SUBJECTIVE:  patient seen earlier. No family at bedside. Earlier had chest pain 10 out of 10 elephant sitting on the chest she walked to the bathroom. Came down to 4/10 with sublingual Nitro. Patient has stopped her anti-angina meds. She has history of chronic angina. Plans for heart catheterization on hold for now. Resumed antianginal  meds by cardiology. Continue heparin  drip for now    VITALS:  Blood pressure 102/84, pulse 71, temperature 97.8 F (36.6 C), resp. rate 17, height 5' 7 (1.702 m), weight 101.2 kg, SpO2 95%.  PHYSICAL EXAMINATION:   GENERAL:  68 y.o.-year-old patient with no acute distress.  LUNGS: Normal breath sounds bilaterally, no wheezing CARDIOVASCULAR: S1, S2 normal. No murmur   ABDOMEN: Soft, nontender, nondistended. Bowel sounds present.  EXTREMITIES: No  edema b/l.    NEUROLOGIC: nonfocal  patient is alert and awake SKIN: No obvious rash, lesion, or ulcer.   LABORATORY PANEL:  CBC Recent Labs  Lab 02/09/24 2201  WBC 9.9  HGB 12.1  HCT 37.0  PLT 245    Chemistries  Recent Labs  Lab 02/09/24 2201  NA 136  K 3.5  CL 103  CO2 24  GLUCOSE 144*  BUN 19  CREATININE 0.70  CALCIUM  9.0  AST 22  ALT 15  ALKPHOS 60  BILITOT 0.6   Cardiac Enzymes No results for input(s): TROPONINI in the last 168 hours. RADIOLOGY:  DG Chest Port 1 View Result Date: 02/09/2024 CLINICAL DATA:  Chest pain EXAM: PORTABLE CHEST 1 VIEW COMPARISON:  04/06/2022 FINDINGS: Stable cardiomediastinal silhouette. Aortic atherosclerotic calcification. Sternotomy. Bibasilar airspace opacities favor atelectasis. No pleural effusion or pneumothorax. IMPRESSION: Bibasilar airspace opacities favor atelectasis. Electronically Signed   By: Norman Gatlin M.D.   On: 02/09/2024 22:14    Assessment and Plan Felicia Woods is a 68 y.o. female with  medical history significant for coronary artery disease status post PCIs and 7 stents, type 2 diabetes mellitus, dyslipidemia, hypertension, asthma and migraine, who presented to the emergency room with acute onset of left upper chest pain with radiation to her left shoulder and associated numbness in the left shoulder and arm.   NSTEMI (non-ST elevated myocardial infarction) (HCC) Chronic Stable Angina - continue her on IV heparin . -- on aspirin  and Plavix  and as needed sublingual nitroglycerin  and morphine  sulfate for pain. - resumed Ranexa  and Imdur  (was not taking) -  Toprol -XL, Lipitor . -- Card[facility] consultation with [facility]-- recommends conservative management for now. - troponins 178--192--164--169   Essential hypertension -  continue antihypertensive therapy.   Dyslipidemia -  continue statin therapy   Asthma, chronic - continue her inhalers.   GERD without esophagitis - on H2 blocker therapy.   Depression - continue Lexapro .       Procedures: Family communication : none Consults : [facility] CODE STATUS: full DVT Prophylaxis : heparin  drip Level of care: Progressive Status is: Inpatient Remains inpatient appropriate because: NSTEMI    TOTAL TIME TAKING CARE OF THIS PATIENT: 45 minutes.  >50% time spent on counselling and coordination of care  Note: This dictation was prepared with Dragon dictation along with smaller phrase technology. Any transcriptional errors that result from this process are unintentional.  Leita Blanch M.D    [facility]   CC: Primary care physician; Kristina Tinnie POUR, PA-C

## 2024-02-10 NOTE — Progress Notes (Signed)
 ANTICOAGULATION CONSULT NOTE  Pharmacy Consult for heparin  infusion Indication: ACS/STEMI  Allergies  Allergen Reactions   Penicillin G Anaphylaxis and Other (See Comments)    Has patient had a PCN reaction causing immediate rash, facial/tongue/throat swelling, SOB or lightheadedness with hypotension: bzd:[id number]} Has patient had a PCN reaction causing severe rash involving mucus membranes or skin necrosis: no:[id number]} Has patient had a PCN reaction that required hospitalization no:[id number]} Has patient had a PCN reaction occurring within the last 10 years: no:[id number]} If all of the above answers are NO, then may proceed with Cephalosporin use.   Penicillins     Other reaction(s): Not available    Patient Measurements: Height: 5' 7 (170.2 cm) Weight: 101.2 kg (223 lb) IBW/kg (Calculated) : 61.6 HEPARIN  DW (KG): 84.2  Vital Signs: Temp: 97.8 F (36.6 C) (08/08 1229) BP: 102/84 (08/08 1229) Pulse Rate: 71 (08/08 1229)  Labs: Recent Labs    02/09/24 2201 02/09/24 2210 02/09/24 2359 02/10/24 0728 02/10/24 0923 02/10/24 1028 02/10/24 1258  HGB 12.1  --   --   --   --   --   --   HCT 37.0  --   --   --   --   --   --   PLT 245  --   --   --   --   --   --   APTT  --  34  --   --   --   --   --   LABPROT  --  14.8  --   --   --   --   --   INR  --  1.1  --   --   --   --   --   HEPARINUNFRC  --   --   --  0.39  --   --  0.44  CREATININE 0.70  --   --   --   --   --   --   TROPONINIHS 178*  --  192*  --  164* 169*  --    Estimated Creatinine Clearance: 84.5 mL/min (by C-G formula based on SCr of 0.7 mg/dL).  Medical History: Past Medical History:  Diagnosis Date   Asthma    Coronary artery disease    Diabetes mellitus without complication (HCC)    Heart attack (HCC)    Hyperlipidemia    Hypertension    MI (myocardial infarction) (HCC)    Migraine headache with aura    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   Assessment: Pt is a 68 yo female with  hypertension, type 2 diabetes, hyperlipidemia, and CAD with reported 7 stents presenting to ED c/o CP, found with elevated Troponin I level. Pharmacy consulted to initiate and manage heparin  infusion for ACS.  Baseline CBC appropriate to initiate heparin  infusion. CrCl 84.50ml/min.  Date Time  Results Comments 8/8 0728 HL=0.39 Therapeutic x 1, rate 1100 u/h 8/8 1258 HL=0.44 Therapeutic x 2  Goal of Therapy:  Heparin  level 0.3-0.7 units/ml Monitor platelets by anticoagulation protocol: Yes   Plan:  HL remains therapeutic  Continue heparin  infusion rate at 1100 units/hr Next HL with AM labs CBC daily while on heparin   Lord Lancour Rodriguez-Guzman PharmD, BCPS 02/10/2024 2:37 PM

## 2024-02-10 NOTE — Consult Note (Signed)
 [facility] CLINIC CARDIOLOGY CONSULT NOTE       Patient ID: Cabella Kimm MRN: [id number] DOB/AGE: 1956-05-20 68 y.o.  Admit date: 02/09/2024 Referring Physician Dr. Lawence Primary Physician McDonough, Tinnie POUR, PA-C Primary Cardiologist Dr. Florencio Reason for Consultation Chest pain  HPI: Felicia Woods is a 68 y.o. female  with a past medical history of coronary artery disease s/p CABGx4, multiple stents, chronic angina, hypertension, hyperlipidemia, type 2 diabetes mellitus, chronic HFpEF, obesity, asthma, COPD who presented to the ED on 02/09/2024 for chest pain. Cardiology was consulted for further evaluation.   Patient presented to the ED with chest pain.  Scribes her chest pain as sharp/stabbing/dull that radiated to her left arm last night.  Patient states her pain was constant and did not worsen with exertion yesterday. Work up in the ED notable for sodium 136, potassium 3.5, creatinine 0.70, hemoglobin 12.1, platelets 245.  LFTs within normal limits.  EKG in ED with sinus rhythm, RBBB rate 76 bpm, without acute ischemic changes, similar to prior EKGs.  Troponins mildly elevated and flat 178 > 192 > 164.  Lipid panel with elevated cholesterol at 203, elevated LDL 147.  BNP within normal limits.  Chest x-ray with no acute cardiopulmonary disease.  Patient given 1X aspirin  324 mg and started on IV heparin  infusion.  At the time of my evaluation this AM, patient was resting comfortably in hospital bed.  We discussed patient's symptoms in further detail.  Patient states last night while laying in her bed she felt acute onset  stabbing/dull/sharp chest pain that radiated to left arm.  Patient states her she got up to walk around her house and the pain remained constant but did not worsen.  Patient states she tried taking sublingual nitro and aspirin  at home that did not relieve her symptoms.  Patient denies any SOB, palpitations, lightheadedness or chest pressure.  Early this morning patient  states her chest pain feels better today but not completely resolved.  Patient states that she has not been taking her antianginal medications due to no refills.  Patient states she has not taken her metoprolol , Imdur  or Ranexa  for at least the past week.  Discussed plan for either medical management with resuming her antianginals or proceeding with LHC.  Patient states she does not want to have another left heart cath since she has had many before and prefers to manage for anginal symptoms with medication.  Pertinent Cardiac History (Most recent) LHC 06/2022 with Dr. Florencio   Mid LAD to [facility] LAD lesion is 90% stenosed.   Ramus-1 lesion is 90% stenosed.   Ramus-2 lesion is 100% stenosed.   Ost Cx to Prox Cx lesion is 90% stenosed.   Prox RCA lesion is 95% stenosed.   Dist RCA lesion is 100% stenosed.   Prox Graft to Mid Graft lesion is 10% stenosed.   Origin to Prox Graft lesion is 100% stenosed.   Origin to Prox Graft lesion is 10% stenosed.   Mid Graft lesion is 65% stenosed.   Origin lesion is 100% stenosed.   Prox Graft lesion is 95% stenosed.   Mid Graft lesion is 85% stenosed.   Origin to Prox Graft lesion is 65% stenosed.   Dist Graft lesion is 80% stenosed.   Non-stenotic Origin lesion was previously treated.   Non-stenotic Mid Graft lesion was previously treated.   Non-stenotic Dist Graft lesion was previously treated.   Non-stenotic Origin lesion was previously treated.   SVG.   There is mild left  ventricular systolic dysfunction.   LV end diastolic pressure is normal.   The left ventricular ejection fraction is 50-55% by visual estimate.  Conclusion: No significant change from previous cath Intervention was deferred because no indication Patient is to be treated medically for chronic stable angina Mynx deployed Recommend aggressive medical management    Review of systems complete and found to be negative unless listed above    Past Medical History:  Diagnosis Date    Asthma    Coronary artery disease    Diabetes mellitus without complication (HCC)    Heart attack (HCC)    Hyperlipidemia    Hypertension    MI (myocardial infarction) (HCC)    Migraine headache with aura    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    Past Surgical History:  Procedure Laterality Date   ABDOMINAL HYSTERECTOMY     CARDIAC CATHETERIZATION     CARDIAC CATHETERIZATION N/A 10/18/2015   Procedure: Left Heart Cath and Cors/Grafts Angiography;  Surgeon: Dorn JINNY Lesches, MD;  Location: [facility] INVASIVE CV LAB;  Service: Cardiovascular;  Laterality: N/A;   CARDIAC CATHETERIZATION N/A 10/18/2015   Procedure: Coronary Stent Intervention;  Surgeon: Dorn JINNY Lesches, MD;  Location: [facility] INVASIVE CV LAB;  Service: Cardiovascular;  Laterality: N/A;   CARDIAC SURGERY     CHOLECYSTECTOMY     COLONOSCOPY WITH PROPOFOL  N/A 12/02/2021   Procedure: COLONOSCOPY WITH PROPOFOL ;  Surgeon: Therisa Bi, MD;  Location: [facility] ENDOSCOPY;  Service: Gastroenterology;  Laterality: N/A;   CORONARY ANGIOPLASTY     CORONARY ARTERY BYPASS GRAFT     4 vessels - 2010   CORONARY STENT INTERVENTION N/A 02/16/2017   Procedure: CORONARY STENT INTERVENTION;  Surgeon: Florencio Cara BIRCH, MD;  Location: [facility] INVASIVE CV LAB;  Service: Cardiovascular;  Laterality: N/A;   CORONARY STENT INTERVENTION N/A 02/13/2019   Procedure: CORONARY STENT INTERVENTION;  Surgeon: Mady Bruckner, MD;  Location: [facility] INVASIVE CV LAB;  Service: Cardiovascular;  Laterality: N/A;  SVG to RCA   CORONARY STENT INTERVENTION Left 03/08/2019   Procedure: CORONARY STENT INTERVENTION;  Surgeon: Florencio Cara BIRCH, MD;  Location: [facility] INVASIVE CV LAB;  Service: Cardiovascular;  Laterality: Left;   CORONARY STENT INTERVENTION N/A 11/04/2020   Procedure: CORONARY STENT INTERVENTION;  Surgeon: Mady Bruckner, MD;  Location: [facility] INVASIVE CV LAB;  Service: Cardiovascular;  Laterality: N/A;   CORONARY STENT INTERVENTION N/A 12/24/2021   Procedure: CORONARY STENT  INTERVENTION;  Surgeon: Lawyer Bernardino Cough, MD;  Location: [facility] INVASIVE CV LAB;  Service: Cardiovascular;  Laterality: N/A;   ESOPHAGOGASTRODUODENOSCOPY N/A 12/02/2021   Procedure: ESOPHAGOGASTRODUODENOSCOPY (EGD);  Surgeon: Therisa Bi, MD;  Location: [facility] ENDOSCOPY;  Service: Gastroenterology;  Laterality: N/A;   LEFT HEART CATH AND CORONARY ANGIOGRAPHY Left 02/16/2017   Procedure: LEFT HEART CATH AND CORONARY ANGIOGRAPHY;  Surgeon: Hester Wolm JINNY, MD;  Location: [facility] INVASIVE CV LAB;  Service: Cardiovascular;  Laterality: Left;   LEFT HEART CATH AND CORONARY ANGIOGRAPHY Left 12/24/2021   Procedure: LEFT HEART CATH AND CORONARY ANGIOGRAPHY;  Surgeon: Hester Wolm JINNY, MD;  Location: [facility] INVASIVE CV LAB;  Service: Cardiovascular;  Laterality: Left;   LEFT HEART CATH AND CORONARY ANGIOGRAPHY Left 06/17/2022   Procedure: LEFT HEART CATH AND CORONARY ANGIOGRAPHY;  Surgeon: Florencio Cara BIRCH, MD;  Location: [facility] INVASIVE CV LAB;  Service: Cardiovascular;  Laterality: Left;   LEFT HEART CATH AND CORS/GRAFTS ANGIOGRAPHY Left 11/23/2017   Procedure: LEFT HEART CATH AND CORS/GRAFTS ANGIOGRAPHY;  Surgeon: Hester Wolm JINNY, MD;  Location: [facility] INVASIVE  CV LAB;  Service: Cardiovascular;  Laterality: Left;   LEFT HEART CATH AND CORS/GRAFTS ANGIOGRAPHY N/A 02/13/2019   Procedure: LEFT HEART CATH AND CORS/GRAFTS ANGIOGRAPHY;  Surgeon: Hester Wolm PARAS, MD;  Location: [facility] INVASIVE CV LAB;  Service: Cardiovascular;  Laterality: N/A;   LEFT HEART CATH AND CORS/GRAFTS ANGIOGRAPHY N/A 07/03/2019   Procedure: LEFT HEART CATH AND CORS/GRAFTS ANGIOGRAPHY;  Surgeon: Hester Wolm PARAS, MD;  Location: [facility] INVASIVE CV LAB;  Service: Cardiovascular;  Laterality: N/A;   LEFT HEART CATH AND CORS/GRAFTS ANGIOGRAPHY N/A 11/04/2020   Procedure: LEFT HEART CATH AND CORS/GRAFTS ANGIOGRAPHY;  Surgeon: Hester Wolm PARAS, MD;  Location: [facility] INVASIVE CV LAB;  Service: Cardiovascular;  Laterality: N/A;    Medications Prior to  Admission  Medication Sig Dispense Refill Last Dose/Taking   aspirin  EC 81 MG tablet Take 81 mg by mouth daily.   Past Week   atorvastatin  (LIPITOR ) 40 MG tablet Take 1 tablet (40 mg total) by mouth daily. 90 tablet 3 Past Week   Budeson-Glycopyrrol-Formoterol  (BREZTRI  AEROSPHERE) 160-9-4.8 MCG/ACT AERO Inhale 2 puffs into the lungs 2 (two) times daily. 10.7 g 11 Past Week   empagliflozin  (JARDIANCE ) 25 MG TABS tablet Take one tab a day for diabetes 90 tablet 3 Past Week   escitalopram  (LEXAPRO ) 10 MG tablet Take 1 tablet (10 mg total) by mouth daily. 30 tablet 2 Past Week   famotidine  (PEPCID ) 20 MG tablet Take 1 tablet (20 mg total) by mouth 2 (two) times daily. 90 tablet 1 Past Week   furosemide  (LASIX ) 40 MG tablet Take 1 tablet (40 mg total) by mouth 2 (two) times daily. 180 tablet 1 Past Week   hydrALAZINE  (APRESOLINE ) 10 MG tablet Take 1 tablet (10 mg total) by mouth 3 (three) times daily. 90 tablet 2 Past Week   losartan  (COZAAR ) 100 MG tablet Take 1 tablet (100 mg total) by mouth daily. 90 tablet 3 Past Week   methocarbamol  (ROBAXIN ) 500 MG tablet Take 500 mg by mouth 3 (three) times daily as needed for muscle spasms.   Past Week   montelukast  (SINGULAIR ) 10 MG tablet Take 1 tablet (10 mg total) by mouth at bedtime. 90 tablet 1 Past Week   nitroGLYCERIN  (NITRODUR - DOSED IN MG/24 HR) 0.1 mg/hr patch Place 0.1 mg onto the skin daily.   Past Week   nitroGLYCERIN  (NITROSTAT ) 0.4 MG SL tablet DISSOLVE ONE TABLET UNDER THE TONGUE EVERY 5 MINUTES AS NEEDED FOR CHEST PAIN.  DO NOT EXCEED A TOTAL OF 3 DOSES IN 15 MINUTES 30 tablet 3 Past Week   oxybutynin  (DITROPAN ) 5 MG tablet Take 1 tablet (5 mg total) by mouth daily. 90 tablet 1 Past Week   PROAIR  RESPICLICK 108 (90 Base) MCG/ACT AEPB Inhale 2 puffs into the lungs every 6 (six) hours as needed. 3 each 1 Unknown   tirzepatide  (MOUNJARO ) 2.5 MG/0.5ML Pen Inject 2.5 mg into the skin once a week. 2 mL 2 02/03/2024   traMADol  (ULTRAM ) 50 MG tablet  Take 50 mg by mouth every 6 (six) hours as needed. for pain   Past Week   zolpidem  (AMBIEN ) 5 MG tablet Take 1 tablet (5 mg total) by mouth at bedtime as needed. for sleep 30 tablet 2 Past Week   Blood Glucose Monitoring Suppl (ONETOUCH VERIO FLEX SYSTEM) w/Device KIT Use as directed twice a daily DX E11.65 1 kit 0    glucose blood (ONETOUCH VERIO) test strip Use as instructed twice daily DX E11.65 100 each 3  Misc. Devices (ADJUST BATH/SHOWER SEAT) MISC 1 shower seat for use at home to reduce risk of falling. 1 each 0    OneTouch Delica Lancets 30G MISC USE 1  TO CHECK GLUCOSE TWICE DAILY AS DIRECTED 100 each 0    Social History   Socioeconomic History   Marital status: Widowed    Spouse name: lynwood   Number of children: Not on file   Years of education: Not on file   Highest education level: Not on file  Occupational History   Not on file  Tobacco Use   Smoking status: Never   Smokeless tobacco: Never  Vaping Use   Vaping status: Never Used  Substance and Sexual Activity   Alcohol use: No    Alcohol/week: 0.0 standard drinks of alcohol   Drug use: No   Sexual activity: Yes  Other Topics Concern   Not on file  Social History Narrative   Not on file   Social Drivers of Health   Financial Resource Strain: Low Risk  (01/27/2024)   Received from [facility]   Overall Financial Resource Strain (CARDIA)    Difficulty of Paying Living Expenses: Not very hard  Food Insecurity: No Food Insecurity (02/10/2024)   Hunger Vital Sign    Worried About Running Out of Food in the Last Year: Never true    Ran Out of Food in the Last Year: Never true  Recent Concern: Food Insecurity - Food Insecurity Present (01/27/2024)   Received from [facility]   Hunger Vital Sign    Within the past 12 months, you worried that your food would run out before you got the money to buy more.: Sometimes true    Within the past 12 months, the food you bought just didn't  last and you didn't have money to get more.: Sometimes true  Transportation Needs: No Transportation Needs (02/10/2024)   PRAPARE - Administrator, Civil Service (Medical): No    Lack of Transportation (Non-Medical): No  Physical Activity: Not on file  Stress: Not on file  Social Connections: Socially Integrated (02/10/2024)   Social Connection and Isolation Panel    Frequency of Communication with Friends and Family: Three times a week    Frequency of Social Gatherings with Friends and Family: Three times a week    Attends Religious Services: More than 4 times per year    Active Member of Clubs or Organizations: No    Attends Banker Meetings: 1 to 4 times per year    Marital Status: Married  Catering manager Violence: Not At Risk (02/10/2024)   Humiliation, Afraid, Rape, and Kick questionnaire    Fear of Current or Ex-Partner: No    Emotionally Abused: No    Physically Abused: No    Sexually Abused: No    Family History  Problem Relation Age of Onset   Diabetes Mother    Diabetes Father    Cancer Father    Diabetes Brother      Vitals:   02/09/24 2330 02/10/24 0019 02/10/24 0417 02/10/24 0739  BP: (!) 150/81 (!) 180/96 113/86 132/85  Pulse: 64 70 65 64  Resp: 20 18 18 20   Temp:  98 F (36.7 C) (!) 97.5 F (36.4 C) 97.8 F (36.6 C)  TempSrc:      SpO2: 97% 98% 97% 98%  Weight:      Height:        PHYSICAL EXAM General: Chronically ill-appearing elderly  female, well nourished, in no acute distress. HEENT: Normocephalic and atraumatic. Neck: No JVD.   Lungs: Normal respiratory effort on room air. Clear bilaterally to auscultation. No wheezes, crackles, rhonchi.  Heart: HRRR. Normal S1 and S2 without gallops or murmurs.  Abdomen: Non-distended appearing.  Msk: Normal strength and tone for age. Extremities: Warm and well perfused. No clubbing, cyanosis, edema.  Neuro: Alert and oriented X 3. Psych: Answers questions appropriately.   Labs: Basic  Metabolic Panel: Recent Labs    02/09/24 2201  NA 136  K 3.5  CL 103  CO2 24  GLUCOSE 144*  BUN 19  CREATININE 0.70  CALCIUM  9.0   Liver Function Tests: Recent Labs    02/09/24 2201  AST 22  ALT 15  ALKPHOS 60  BILITOT 0.6  PROT 7.3  ALBUMIN 3.4*   No results for input(s): LIPASE, AMYLASE in the last 72 hours. CBC: Recent Labs    02/09/24 2201  WBC 9.9  NEUTROABS 6.5  HGB 12.1  HCT 37.0  MCV 79.7*  PLT 245   Cardiac Enzymes: Recent Labs    02/09/24 2359 02/10/24 0923 02/10/24 1028  TROPONINIHS 192* 164* 169*   BNP: Recent Labs    02/09/24 2201  BNP 99.1   D-Dimer: No results for input(s): DDIMER in the last 72 hours. Hemoglobin A1C: No results for input(s): HGBA1C in the last 72 hours. Fasting Lipid Panel: Recent Labs    02/10/24 0459  CHOL 203*  HDL 42  LDLCALC 147*  TRIG 70  CHOLHDL 4.8   Thyroid  Function Tests: No results for input(s): TSH, T4TOTAL, T3FREE, THYROIDAB in the last 72 hours.  Invalid input(s): FREET3 Anemia Panel: No results for input(s): VITAMINB12, FOLATE, FERRITIN, TIBC, IRON, RETICCTPCT in the last 72 hours.   Radiology: [facility] Chest Port 1 View Result Date: 02/09/2024 CLINICAL DATA:  Chest pain EXAM: PORTABLE CHEST 1 VIEW COMPARISON:  04/06/2022 FINDINGS: Stable cardiomediastinal silhouette. Aortic atherosclerotic calcification. Sternotomy. Bibasilar airspace opacities favor atelectasis. No pleural effusion or pneumothorax. IMPRESSION: Bibasilar airspace opacities favor atelectasis. Electronically Signed   By: Norman Gatlin M.D.   On: 02/09/2024 22:14    ECHO ordered.  TELEMETRY reviewed by me 02/10/2024: sinsu rhythm, rate 70s  EKG reviewed by me: sinus rhythm, RBBB rate 76 bpm, without acute ischemic changes, similar to prior EKGs  Data reviewed by me 02/10/2024: last 24h vitals tele labs imaging I/O ED provider note, admission H&P.  Principal Problem:   NSTEMI (non-ST elevated  myocardial infarction) (HCC) Active Problems:   Essential hypertension   Depression   Dyslipidemia   GERD without esophagitis   Asthma, chronic    ASSESSMENT AND PLAN:  Felicia Woods is a 68 y.o. female  with a past medical history of coronary artery disease s/p CABG, multiple stents, chronic angina, hypertension, hyperlipidemia, type 2 diabetes mellitus, chronic HFpEF, obesity, asthma, COPD who presented to the ED on 02/09/2024 for chest pain. Cardiology was consulted for further evaluation.  # Chronic Angina, mildly and flat elevated troponin # Coronary artery disease s/p CABG x 4, multiple stents # Hypertension # Hyperlipidemia  Patient presents with chest pain. Patient not taken her metoprolol , Imdur  or Ranexa  for at least the past week.  Discussed plan for either medical management with resuming her antianginals or proceeding with LHC.  Patient states she does not want to have another left heart cath since she has had many before and prefers to manage anginal symptoms with medication.  She states her chest pain has improved  since yesterday. EKG in ED with sinus rhythm, RBBB rate 76 bpm, without acute ischemic changes, similar to prior EKGs.  Troponins mildly elevated and flat 178 > 192 > 164.  Lipid panel with elevated cholesterol at 203, elevated LDL 147.   -Echo ordered. -Continue IV heparin  infusion for 24 hours. -Continue aspirin  81 mg daily.  -Increase atorvastatin  to 80 mg daily. Recommend goal LDL < 50. -Continue losartan  100 mg daily. -Resume antianginal medications.  Will plan to optimize these medications. (Resume Metoprolol  succinate 100 mg, Imdur  120 mg, Ranexa  1000 mg BID)  Chronic HFpEF Without SOB or lower extremity swelling.  BNP within normal limits.  Chest x-ray negative.  Appears euvolemic on exam. -Continue home Lasix  40 mg twice daily. -Continue home dapagliflozin  25 mg daily. -Losartan , metoprolol  as stated above.   This patient's plan of care was discussed  and created with Dr. Florencio and he is in agreement.  Signed: Arnell Slivinski, PA-C  02/10/2024, 11:30 AM [facility]

## 2024-02-10 NOTE — Care Management Important Message (Signed)
 Important Message  Patient Details  Name: Felicia Woods MRN: [id number] Date of Birth: 02/12/1956   Important Message Given:  Yes - Medicare IM     Rojelio SHAUNNA Rattler 02/10/2024, 1:30 PM

## 2024-02-10 NOTE — Progress Notes (Signed)
 Echocardiogram 2D Echocardiogram has been performed.  Felicia Woods 02/10/2024, 4:22 PM

## 2024-02-11 DIAGNOSIS — I491 Atrial premature depolarization: Secondary | ICD-10-CM | POA: Diagnosis not present

## 2024-02-11 DIAGNOSIS — I214 Non-ST elevation (NSTEMI) myocardial infarction: Secondary | ICD-10-CM | POA: Diagnosis not present

## 2024-02-11 DIAGNOSIS — I4510 Unspecified right bundle-branch block: Secondary | ICD-10-CM | POA: Diagnosis not present

## 2024-02-11 LAB — BASIC METABOLIC PANEL WITH GFR
Anion gap: 9 (ref 5–15)
BUN: 23 mg/dL (ref 8–23)
CO2: 26 mmol/L (ref 22–32)
Calcium: 9.1 mg/dL (ref 8.9–10.3)
Chloride: 101 mmol/L (ref 98–111)
Creatinine, Ser: 1.28 mg/dL — ABNORMAL HIGH (ref 0.44–1.00)
GFR, Estimated: 46 mL/min — ABNORMAL LOW (ref >60)
Glucose, Bld: 171 mg/dL — ABNORMAL HIGH (ref 70–99)
Potassium: 4.1 mmol/L (ref 3.5–5.1)
Sodium: 136 mmol/L (ref 135–145)

## 2024-02-11 LAB — ECHOCARDIOGRAM COMPLETE
AR max vel: 2.2 cm2
AV Peak grad: 4.2 mmHg
Ao pk vel: 1.02 m/s
Area-P 1/2: 3.99 cm2
Height: 67 in
S' Lateral: 2.7 cm
Weight: 3568 [oz_av]

## 2024-02-11 LAB — CBC
HCT: 35.9 % — ABNORMAL LOW (ref 36.0–46.0)
Hemoglobin: 11.4 g/dL — ABNORMAL LOW (ref 12.0–15.0)
MCH: 26.2 pg (ref 26.0–34.0)
MCHC: 31.8 g/dL (ref 30.0–36.0)
MCV: 82.5 fL (ref 80.0–100.0)
Platelets: 218 K/uL (ref 150–400)
RBC: 4.35 MIL/uL (ref 3.87–5.11)
RDW: 16.6 % — ABNORMAL HIGH (ref 11.5–15.5)
WBC: 8.1 K/uL (ref 4.0–10.5)
nRBC: 0 % (ref 0.0–0.2)

## 2024-02-11 LAB — HEPARIN LEVEL (UNFRACTIONATED): Heparin Unfractionated: 0.39 [IU]/mL (ref 0.30–0.70)

## 2024-02-11 MED ORDER — LOSARTAN POTASSIUM 50 MG PO TABS
50.0000 mg | ORAL_TABLET | Freq: Every day | ORAL | Status: DC
  Administered 2024-02-11 – 2024-02-13 (×4): 50 mg via ORAL
  Filled 2024-02-11 (×3): qty 1

## 2024-02-11 MED ORDER — ISOSORBIDE MONONITRATE ER 60 MG PO TB24
120.0000 mg | ORAL_TABLET | Freq: Two times a day (BID) | ORAL | Status: DC
  Administered 2024-02-11 – 2024-02-13 (×8): 120 mg via ORAL
  Filled 2024-02-11 (×6): qty 2

## 2024-02-11 NOTE — Progress Notes (Signed)
 [facility] CLINIC CARDIOLOGY PROGRESS NOTE       Patient ID: Felicia Woods MRN: [id number] DOB/AGE: 1955-08-18 68 y.o.  Admit date: 02/09/2024 Referring Physician Dr. Lawence Primary Physician McDonough, Tinnie POUR, PA-C Primary Cardiologist Dr. Florencio Reason for Consultation Chest pain  HPI: Felicia Woods is a 68 y.o. female  with a past medical history of coronary artery disease s/p CABGx4, multiple stents, chronic angina, hypertension, hyperlipidemia, type 2 diabetes mellitus, chronic HFpEF, obesity, asthma, COPD who presented to the ED on 02/09/2024 for chest pain. Cardiology was consulted for further evaluation.   Interval history: -patient seen and examined this AM, resting comfortably in hospital bed.  -States she had an episode of CP overnight when she got up to go to the bathroom which improved with SL NTG. -BP and HR stable this AM, no overnight events on tele.  -Cr bumped this AM to 1.28.  Pertinent Cardiac History (Most recent) LHC 06/2022 with Dr. Florencio   Mid LAD to [facility] LAD lesion is 90% stenosed.   Ramus-1 lesion is 90% stenosed.   Ramus-2 lesion is 100% stenosed.   Ost Cx to Prox Cx lesion is 90% stenosed.   Prox RCA lesion is 95% stenosed.   Dist RCA lesion is 100% stenosed.   Prox Graft to Mid Graft lesion is 10% stenosed.   Origin to Prox Graft lesion is 100% stenosed.   Origin to Prox Graft lesion is 10% stenosed.   Mid Graft lesion is 65% stenosed.   Origin lesion is 100% stenosed.   Prox Graft lesion is 95% stenosed.   Mid Graft lesion is 85% stenosed.   Origin to Prox Graft lesion is 65% stenosed.   Dist Graft lesion is 80% stenosed.   Non-stenotic Origin lesion was previously treated.   Non-stenotic Mid Graft lesion was previously treated.   Non-stenotic Dist Graft lesion was previously treated.   Non-stenotic Origin lesion was previously treated.   SVG.   There is mild left ventricular systolic dysfunction.   LV end diastolic pressure is normal.    The left ventricular ejection fraction is 50-55% by visual estimate.  Conclusion: No significant change from previous cath Intervention was deferred because no indication Patient is to be treated medically for chronic stable angina Mynx deployed Recommend aggressive medical management    Review of systems complete and found to be negative unless listed above    Past Medical History:  Diagnosis Date   Asthma    Coronary artery disease    Diabetes mellitus without complication (HCC)    Heart attack (HCC)    Hyperlipidemia    Hypertension    MI (myocardial infarction) (HCC)    Migraine headache with aura    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    Past Surgical History:  Procedure Laterality Date   ABDOMINAL HYSTERECTOMY     CARDIAC CATHETERIZATION     CARDIAC CATHETERIZATION N/A 10/18/2015   Procedure: Left Heart Cath and Cors/Grafts Angiography;  Surgeon: Dorn JINNY Lesches, MD;  Location: [facility] INVASIVE CV LAB;  Service: Cardiovascular;  Laterality: N/A;   CARDIAC CATHETERIZATION N/A 10/18/2015   Procedure: Coronary Stent Intervention;  Surgeon: Dorn JINNY Lesches, MD;  Location: [facility] INVASIVE CV LAB;  Service: Cardiovascular;  Laterality: N/A;   CARDIAC SURGERY     CHOLECYSTECTOMY     COLONOSCOPY WITH PROPOFOL  N/A 12/02/2021   Procedure: COLONOSCOPY WITH PROPOFOL ;  Surgeon: Therisa Bi, MD;  Location: [facility] ENDOSCOPY;  Service: Gastroenterology;  Laterality: N/A;   CORONARY ANGIOPLASTY  CORONARY ARTERY BYPASS GRAFT     4 vessels - 2010   CORONARY STENT INTERVENTION N/A 02/16/2017   Procedure: CORONARY STENT INTERVENTION;  Surgeon: Florencio Cara BIRCH, MD;  Location: [facility] INVASIVE CV LAB;  Service: Cardiovascular;  Laterality: N/A;   CORONARY STENT INTERVENTION N/A 02/13/2019   Procedure: CORONARY STENT INTERVENTION;  Surgeon: Mady Bruckner, MD;  Location: [facility] INVASIVE CV LAB;  Service: Cardiovascular;  Laterality: N/A;  SVG to RCA   CORONARY STENT INTERVENTION Left 03/08/2019    Procedure: CORONARY STENT INTERVENTION;  Surgeon: Florencio Cara BIRCH, MD;  Location: [facility] INVASIVE CV LAB;  Service: Cardiovascular;  Laterality: Left;   CORONARY STENT INTERVENTION N/A 11/04/2020   Procedure: CORONARY STENT INTERVENTION;  Surgeon: Mady Bruckner, MD;  Location: [facility] INVASIVE CV LAB;  Service: Cardiovascular;  Laterality: N/A;   CORONARY STENT INTERVENTION N/A 12/24/2021   Procedure: CORONARY STENT INTERVENTION;  Surgeon: Lawyer Bernardino Cough, MD;  Location: [facility] INVASIVE CV LAB;  Service: Cardiovascular;  Laterality: N/A;   ESOPHAGOGASTRODUODENOSCOPY N/A 12/02/2021   Procedure: ESOPHAGOGASTRODUODENOSCOPY (EGD);  Surgeon: Therisa Bi, MD;  Location: [facility] ENDOSCOPY;  Service: Gastroenterology;  Laterality: N/A;   LEFT HEART CATH AND CORONARY ANGIOGRAPHY Left 02/16/2017   Procedure: LEFT HEART CATH AND CORONARY ANGIOGRAPHY;  Surgeon: Hester Wolm PARAS, MD;  Location: [facility] INVASIVE CV LAB;  Service: Cardiovascular;  Laterality: Left;   LEFT HEART CATH AND CORONARY ANGIOGRAPHY Left 12/24/2021   Procedure: LEFT HEART CATH AND CORONARY ANGIOGRAPHY;  Surgeon: Hester Wolm PARAS, MD;  Location: [facility] INVASIVE CV LAB;  Service: Cardiovascular;  Laterality: Left;   LEFT HEART CATH AND CORONARY ANGIOGRAPHY Left 06/17/2022   Procedure: LEFT HEART CATH AND CORONARY ANGIOGRAPHY;  Surgeon: Florencio Cara BIRCH, MD;  Location: [facility] INVASIVE CV LAB;  Service: Cardiovascular;  Laterality: Left;   LEFT HEART CATH AND CORS/GRAFTS ANGIOGRAPHY Left 11/23/2017   Procedure: LEFT HEART CATH AND CORS/GRAFTS ANGIOGRAPHY;  Surgeon: Hester Wolm PARAS, MD;  Location: [facility] INVASIVE CV LAB;  Service: Cardiovascular;  Laterality: Left;   LEFT HEART CATH AND CORS/GRAFTS ANGIOGRAPHY N/A 02/13/2019   Procedure: LEFT HEART CATH AND CORS/GRAFTS ANGIOGRAPHY;  Surgeon: Hester Wolm PARAS, MD;  Location: [facility] INVASIVE CV LAB;  Service: Cardiovascular;  Laterality: N/A;   LEFT HEART CATH AND CORS/GRAFTS ANGIOGRAPHY N/A 07/03/2019    Procedure: LEFT HEART CATH AND CORS/GRAFTS ANGIOGRAPHY;  Surgeon: Hester Wolm PARAS, MD;  Location: [facility] INVASIVE CV LAB;  Service: Cardiovascular;  Laterality: N/A;   LEFT HEART CATH AND CORS/GRAFTS ANGIOGRAPHY N/A 11/04/2020   Procedure: LEFT HEART CATH AND CORS/GRAFTS ANGIOGRAPHY;  Surgeon: Hester Wolm PARAS, MD;  Location: [facility] INVASIVE CV LAB;  Service: Cardiovascular;  Laterality: N/A;    Medications Prior to Admission  Medication Sig Dispense Refill Last Dose/Taking   aspirin  EC 81 MG tablet Take 81 mg by mouth daily.   Past Week   atorvastatin  (LIPITOR ) 40 MG tablet Take 1 tablet (40 mg total) by mouth daily. 90 tablet 3 Past Week   Budeson-Glycopyrrol-Formoterol  (BREZTRI  AEROSPHERE) 160-9-4.8 MCG/ACT AERO Inhale 2 puffs into the lungs 2 (two) times daily. 10.7 g 11 Past Week   empagliflozin  (JARDIANCE ) 25 MG TABS tablet Take one tab a day for diabetes 90 tablet 3 Past Week   escitalopram  (LEXAPRO ) 10 MG tablet Take 1 tablet (10 mg total) by mouth daily. 30 tablet 2 Past Week   famotidine  (PEPCID ) 20 MG tablet Take 1 tablet (20 mg total) by mouth 2 (two) times daily. 90 tablet 1 Past Week   furosemide  (LASIX ) 40  MG tablet Take 1 tablet (40 mg total) by mouth 2 (two) times daily. 180 tablet 1 Past Week   hydrALAZINE  (APRESOLINE ) 10 MG tablet Take 1 tablet (10 mg total) by mouth 3 (three) times daily. 90 tablet 2 Past Week   losartan  (COZAAR ) 100 MG tablet Take 1 tablet (100 mg total) by mouth daily. 90 tablet 3 Past Week   methocarbamol  (ROBAXIN ) 500 MG tablet Take 500 mg by mouth 3 (three) times daily as needed for muscle spasms.   Past Week   montelukast  (SINGULAIR ) 10 MG tablet Take 1 tablet (10 mg total) by mouth at bedtime. 90 tablet 1 Past Week   nitroGLYCERIN  (NITRODUR - DOSED IN MG/24 HR) 0.1 mg/hr patch Place 0.1 mg onto the skin daily.   Past Week   nitroGLYCERIN  (NITROSTAT ) 0.4 MG SL tablet DISSOLVE ONE TABLET UNDER THE TONGUE EVERY 5 MINUTES AS NEEDED FOR CHEST PAIN.  DO NOT EXCEED  A TOTAL OF 3 DOSES IN 15 MINUTES 30 tablet 3 Past Week   oxybutynin  (DITROPAN ) 5 MG tablet Take 1 tablet (5 mg total) by mouth daily. 90 tablet 1 Past Week   PROAIR  RESPICLICK 108 (90 Base) MCG/ACT AEPB Inhale 2 puffs into the lungs every 6 (six) hours as needed. 3 each 1 Unknown   tirzepatide  (MOUNJARO ) 2.5 MG/0.5ML Pen Inject 2.5 mg into the skin once a week. 2 mL 2 02/03/2024   traMADol  (ULTRAM ) 50 MG tablet Take 50 mg by mouth every 6 (six) hours as needed. for pain   Past Week   zolpidem  (AMBIEN ) 5 MG tablet Take 1 tablet (5 mg total) by mouth at bedtime as needed. for sleep 30 tablet 2 Past Week   Blood Glucose Monitoring Suppl (ONETOUCH VERIO FLEX SYSTEM) w/Device KIT Use as directed twice a daily DX E11.65 1 kit 0    glucose blood (ONETOUCH VERIO) test strip Use as instructed twice daily DX E11.65 100 each 3    Misc. Devices (ADJUST BATH/SHOWER SEAT) MISC 1 shower seat for use at home to reduce risk of falling. 1 each 0    OneTouch Delica Lancets 30G MISC USE 1  TO CHECK GLUCOSE TWICE DAILY AS DIRECTED 100 each 0    Social History   Socioeconomic History   Marital status: Widowed    Spouse name: lynwood   Number of children: Not on file   Years of education: Not on file   Highest education level: Not on file  Occupational History   Not on file  Tobacco Use   Smoking status: Never   Smokeless tobacco: Never  Vaping Use   Vaping status: Never Used  Substance and Sexual Activity   Alcohol use: No    Alcohol/week: 0.0 standard drinks of alcohol   Drug use: No   Sexual activity: Yes  Other Topics Concern   Not on file  Social History Narrative   Not on file   Social Drivers of Health   Financial Resource Strain: Low Risk  (01/27/2024)   Received from [facility]   Overall Financial Resource Strain (CARDIA)    Difficulty of Paying Living Expenses: Not very hard  Food Insecurity: No Food Insecurity (02/10/2024)   Hunger Vital Sign    Worried About Running Out  of Food in the Last Year: Never true    Ran Out of Food in the Last Year: Never true  Recent Concern: Food Insecurity - Food Insecurity Present (01/27/2024)   Received from [facility]  Hunger Vital Sign    Within the past 12 months, you worried that your food would run out before you got the money to buy more.: Sometimes true    Within the past 12 months, the food you bought just didn't last and you didn't have money to get more.: Sometimes true  Transportation Needs: No Transportation Needs (02/10/2024)   PRAPARE - Administrator, Civil Service (Medical): No    Lack of Transportation (Non-Medical): No  Physical Activity: Not on file  Stress: Not on file  Social Connections: Socially Integrated (02/10/2024)   Social Connection and Isolation Panel    Frequency of Communication with Friends and Family: Three times a week    Frequency of Social Gatherings with Friends and Family: Three times a week    Attends Religious Services: More than 4 times per year    Active Member of Clubs or Organizations: No    Attends Banker Meetings: 1 to 4 times per year    Marital Status: Married  Catering manager Violence: Not At Risk (02/10/2024)   Humiliation, Afraid, Rape, and Kick questionnaire    Fear of Current or Ex-Partner: No    Emotionally Abused: No    Physically Abused: No    Sexually Abused: No    Family History  Problem Relation Age of Onset   Diabetes Mother    Diabetes Father    Cancer Father    Diabetes Brother      Vitals:   02/10/24 2012 02/10/24 2355 02/11/24 0409 02/11/24 0837  BP: 102/67 94/64 102/63 114/77  Pulse: 78 62 62 64  Resp: 19 20 20    Temp: (!) 97.5 F (36.4 C) 97.7 F (36.5 C) 97.8 F (36.6 C) 97.6 F (36.4 C)  TempSrc:      SpO2: 96% 100% 98% 99%  Weight:      Height:        PHYSICAL EXAM General: Chronically ill-appearing elderly female, well nourished, in no acute distress. HEENT: Normocephalic and  atraumatic. Neck: No JVD.   Lungs: Normal respiratory effort on room air. Clear bilaterally to auscultation. No wheezes, crackles, rhonchi.  Heart: HRRR. Normal S1 and S2 without gallops or murmurs.  Abdomen: Non-distended appearing.  Msk: Normal strength and tone for age. Extremities: Warm and well perfused. No clubbing, cyanosis, edema.  Neuro: Alert and oriented X 3. Psych: Answers questions appropriately.   Labs: Basic Metabolic Panel: Recent Labs    02/09/24 2201 02/11/24 0402  NA 136 136  K 3.5 4.1  CL 103 101  CO2 24 26  GLUCOSE 144* 171*  BUN 19 23  CREATININE 0.70 1.28*  CALCIUM  9.0 9.1   Liver Function Tests: Recent Labs    02/09/24 2201  AST 22  ALT 15  ALKPHOS 60  BILITOT 0.6  PROT 7.3  ALBUMIN 3.4*   No results for input(s): LIPASE, AMYLASE in the last 72 hours. CBC: Recent Labs    02/09/24 2201 02/11/24 0402  WBC 9.9 8.1  NEUTROABS 6.5  --   HGB 12.1 11.4*  HCT 37.0 35.9*  MCV 79.7* 82.5  PLT 245 218   Cardiac Enzymes: Recent Labs    02/09/24 2359 02/10/24 0923 02/10/24 1028  TROPONINIHS 192* 164* 169*   BNP: Recent Labs    02/09/24 2201  BNP 99.1   D-Dimer: No results for input(s): DDIMER in the last 72 hours. Hemoglobin A1C: No results for input(s): HGBA1C in the last 72 hours. Fasting Lipid Panel: Recent Labs  02/10/24 0459  CHOL 203*  HDL 42  LDLCALC 147*  TRIG 70  CHOLHDL 4.8   Thyroid  Function Tests: No results for input(s): TSH, T4TOTAL, T3FREE, THYROIDAB in the last 72 hours.  Invalid input(s): FREET3 Anemia Panel: No results for input(s): VITAMINB12, FOLATE, FERRITIN, TIBC, IRON, RETICCTPCT in the last 72 hours.   Radiology: [facility] Chest Port 1 View Result Date: 02/09/2024 CLINICAL DATA:  Chest pain EXAM: PORTABLE CHEST 1 VIEW COMPARISON:  04/06/2022 FINDINGS: Stable cardiomediastinal silhouette. Aortic atherosclerotic calcification. Sternotomy. Bibasilar airspace opacities favor  atelectasis. No pleural effusion or pneumothorax. IMPRESSION: Bibasilar airspace opacities favor atelectasis. Electronically Signed   By: Norman Gatlin M.D.   On: 02/09/2024 22:14    ECHO pending.  TELEMETRY reviewed by me 02/11/2024: sinsu bradycardia 50s  EKG reviewed by me: sinus rhythm, RBBB rate 76 bpm, without acute ischemic changes, similar to prior EKGs  Data reviewed by me 02/11/2024: last 24h vitals tele labs imaging I/O hospitalist progress note  Principal Problem:   NSTEMI (non-ST elevated myocardial infarction) (HCC) Active Problems:   Essential hypertension   Depression   Dyslipidemia   GERD without esophagitis   Asthma, chronic    ASSESSMENT AND PLAN:  Amayrani Bennick is a 68 y.o. female  with a past medical history of coronary artery disease s/p CABG, multiple stents, chronic angina, hypertension, hyperlipidemia, type 2 diabetes mellitus, chronic HFpEF, obesity, asthma, COPD who presented to the ED on 02/09/2024 for chest pain. Cardiology was consulted for further evaluation.  # Chronic Angina, mildly and flat elevated troponin # Coronary artery disease s/p CABG x 4, multiple stents # Hypertension # Hyperlipidemia  Patient presents with chest pain. Patient not taken her metoprolol , Imdur  or Ranexa  for at least the past week.  Discussed plan for either medical management with resuming her antianginals or proceeding with LHC.  Patient states she does not want to have another left heart cath since she has had many before and prefers to manage anginal symptoms with medication.  She states her chest pain has improved since yesterday. EKG in ED with sinus rhythm, RBBB rate 76 bpm, without acute ischemic changes, similar to prior EKGs.  Troponins mildly elevated and flat 178 > 192 > 164.  Lipid panel with elevated cholesterol at 203, elevated LDL 147.   -Echo pending. -Continue IV heparin  infusion for 24 hours. -Continue aspirin  81 mg daily.  -Increase atorvastatin  to 80 mg daily.  Recommend goal LDL < 50. -Decrease losartan  to 50 mg daily. -Increase imdur  to 120 mg twice daily. Continue ranexa  1000 mg twice daily, metoprolol  succinate 100 mg daily. Uptitrate metoprolol  as able.  # Chronic HFpEF Without SOB or lower extremity swelling.  BNP within normal limits.  Chest x-ray negative.  Appears euvolemic on exam. -Hold home lasix  to due bump in creatinine.  -Continue home dapagliflozin  25 mg daily. -Losartan , metoprolol  as stated above.   This patient's plan of care was discussed and created with Dr. Florencio and he is in agreement.  Signed: Danita Bloch, PA-C  02/11/2024, 8:55 AM [facility] Cardiology

## 2024-02-11 NOTE — Progress Notes (Signed)
 [facility]  - [place] at [facility]   PATIENT NAME: Felicia Woods    MR#:  [id number]  DATE OF BIRTH:  1955/08/22  SUBJECTIVE:  patient seen earlier. No family at bedside. Earlier had chest pain she walked to the bathroom. Came down to 4/10 with sublingual Nitro. Eating BF   VITALS:  Blood pressure 115/71, pulse 60, temperature 97.7 F (36.5 C), resp. rate 16, height 5' 7 (1.702 m), weight 101.2 kg, SpO2 100%.  PHYSICAL EXAMINATION:   GENERAL:  68 y.o.-year-old patient with no acute distress.  LUNGS: Normal breath sounds bilaterally, no wheezing CARDIOVASCULAR: S1, S2 normal. No murmur   ABDOMEN: Soft, nontender, nondistended. Bowel sounds present.  EXTREMITIES: No  edema b/l.    NEUROLOGIC: nonfocal  patient is alert and awake SKIN: No obvious rash, lesion, or ulcer.   LABORATORY PANEL:  CBC Recent Labs  Lab 02/11/24 0402  WBC 8.1  HGB 11.4*  HCT 35.9*  PLT 218    Chemistries  Recent Labs  Lab 02/09/24 2201 02/11/24 0402  NA 136 136  K 3.5 4.1  CL 103 101  CO2 24 26  GLUCOSE 144* 171*  BUN 19 23  CREATININE 0.70 1.28*  CALCIUM  9.0 9.1  AST 22  --   ALT 15  --   ALKPHOS 60  --   BILITOT 0.6  --    Cardiac Enzymes No results for input(s): TROPONINI in the last 168 hours. RADIOLOGY:  ECHOCARDIOGRAM COMPLETE Result Date: 02/11/2024    ECHOCARDIOGRAM REPORT   Patient Name:   Felicia Woods Date of Exam: 02/10/2024 Medical Rec #:  [id number]      Height:       67.0 in Accession #:    [id number]     Weight:       223.0 lb Date of Birth:  [birth year]     BSA:          2.118 m Patient Age:    67 years       BP:           102/84 mmHg Patient Gender: F              HR:           61 bpm. Exam Location:  [facility] Procedure: 2D Echo, Cardiac Doppler and Color Doppler (Both Spectral and Color            Flow Doppler were utilized during procedure). Indications:     Chest Pain R07.9  History:         Patient has prior history of Echocardiogram examinations,  most                  recent 04/07/2022. CHF, Previous Myocardial Infarction, Acute                  MI, CAD and Angina, Prior CABG, Signs/Symptoms:Shortness of                  Breath; Risk Factors:Diabetes and Dyslipidemia.  Sonographer:     Thea Norlander RCS Referring Phys:  [id number] DORENE COMFORT Diagnosing Phys: Cara JONETTA Lovelace MD IMPRESSIONS  1. Left ventricular ejection fraction, by estimation, is 55 to 60%. The left ventricle has normal function. The left ventricle has no regional wall motion abnormalities. Left ventricular diastolic parameters were normal.  2. Right ventricular systolic function is normal. The right ventricular size is normal.  3. The mitral valve is normal in structure. Trivial mitral valve  regurgitation.  4. The aortic valve is normal in structure. Aortic valve regurgitation is not visualized. FINDINGS  Left Ventricle: Left ventricular ejection fraction, by estimation, is 55 to 60%. The left ventricle has normal function. The left ventricle has no regional wall motion abnormalities. Strain was performed and the global longitudinal strain is indeterminate. The left ventricular internal cavity size was normal in size. There is no left ventricular hypertrophy. Left ventricular diastolic parameters were normal. Right Ventricle: The right ventricular size is normal. No increase in right ventricular wall thickness. Right ventricular systolic function is normal. Left Atrium: Left atrial size was normal in size. Right Atrium: Right atrial size was normal in size. Pericardium: There is no evidence of pericardial effusion. Mitral Valve: The mitral valve is normal in structure. Trivial mitral valve regurgitation. Tricuspid Valve: The tricuspid valve is normal in structure. Tricuspid valve regurgitation is not demonstrated. Aortic Valve: The aortic valve is normal in structure. Aortic valve regurgitation is not visualized. Aortic valve peak gradient measures 4.2 mmHg. Pulmonic Valve: The  pulmonic valve was normal in structure. Pulmonic valve regurgitation is not visualized. Aorta: The ascending aorta was not well visualized. IAS/Shunts: No atrial level shunt detected by color flow Doppler. Additional Comments: 3D was performed not requiring image post processing on an independent workstation and was indeterminate.  LEFT VENTRICLE PLAX 2D LVIDd:         3.80 cm   Diastology LVIDs:         2.70 cm   LV e' medial:    4.24 cm/s LV PW:         1.05 cm   LV E/e' medial:  15.4 LV IVS:        0.80 cm   LV e' lateral:   7.72 cm/s LVOT diam:     1.90 cm   LV E/e' lateral: 8.5 LV SV:         37 LV SV Index:   17 LVOT Area:     2.84 cm  RIGHT VENTRICLE            IVC RV S prime:     4.20 cm/s  IVC diam: 1.60 cm LEFT ATRIUM         Index LA diam:    3.55 cm 1.68 cm/m  AORTIC VALVE AV Area (Vmax): 2.20 cm AV Vmax:        102.00 cm/s AV Peak Grad:   4.2 mmHg LVOT Vmax:      79.10 cm/s LVOT Vmean:     49.800 cm/s LVOT VTI:       0.129 m  AORTA Ao Root diam: 2.90 cm Ao Asc diam:  3.00 cm MITRAL VALVE MV Area (PHT): 3.99 cm    SHUNTS MV Decel Time: 190 msec    Systemic VTI:  0.13 m MV E velocity: 65.50 cm/s  Systemic Diam: 1.90 cm MV A velocity: 56.60 cm/s MV E/A ratio:  1.16 Dwayne JONETTA Lovelace MD Electronically signed by Cara JONETTA Lovelace MD Signature Date/Time: 02/11/2024/11:13:05 AM    Final    DG Chest Port 1 View Result Date: 02/09/2024 CLINICAL DATA:  Chest pain EXAM: PORTABLE CHEST 1 VIEW COMPARISON:  04/06/2022 FINDINGS: Stable cardiomediastinal silhouette. Aortic atherosclerotic calcification. Sternotomy. Bibasilar airspace opacities favor atelectasis. No pleural effusion or pneumothorax. IMPRESSION: Bibasilar airspace opacities favor atelectasis. Electronically Signed   By: Norman Gatlin M.D.   On: 02/09/2024 22:14    Assessment and Plan Felicia Woods is a 68 y.o. female with medical history significant  for coronary artery disease status post PCIs and 7 stents, type 2 diabetes mellitus,  dyslipidemia, hypertension, asthma and migraine, who presented to the emergency room with acute onset of left upper chest pain with radiation to her left shoulder and associated numbness in the left shoulder and arm.   NSTEMI (non-ST elevated myocardial infarction) (HCC) Chronic Stable Angina - continue her on IV heparin  for 48 hours -- on aspirin  and Plavix  and as needed sublingual nitroglycerin  and morphine  sulfate for pain. - resumed Ranexa  and Imdur  (pt was not taking) -  Toprol -XL, Lipitor . --decreased losartan  to 50 mg (creat up) -- Cardiology consultation with [facility]-- recommends conservative management for now. - troponins 178--192--164--169   Essential hypertension -  continue antihypertensive therapy.   Dyslipidemia -  continue statin therapy   Asthma, chronic - continue her inhalers.   GERD without esophagitis - on H2 blocker therapy.   Depression - continue Lexapro .       Procedures: Family communication : none Consults : [facility] cardiology CODE STATUS: full DVT Prophylaxis : heparin  drip Level of care: Progressive Status is: Inpatient Remains inpatient appropriate because: NSTEMI--on heparin  gtt and adjusting meds    TOTAL TIME TAKING CARE OF THIS PATIENT: 35 minutes.  >50% time spent on counselling and coordination of care  Note: This dictation was prepared with Dragon dictation along with smaller phrase technology. Any transcriptional errors that result from this process are unintentional.  Leita Blanch M.D    [facility]   CC: Primary care physician; Kristina Tinnie POUR, PA-C

## 2024-02-11 NOTE — Plan of Care (Signed)
  Problem: Education: Goal: Understanding of cardiac disease, CV risk reduction, and recovery process will improve Outcome: Progressing   Problem: Health Behavior/Discharge Planning: Goal: Ability to safely manage health-related needs after discharge will improve Outcome: Progressing   Problem: Education: Goal: Knowledge of General Education information will improve Description: Including pain rating scale, medication(s)/side effects and non-pharmacologic comfort measures Outcome: Progressing   Problem: Health Behavior/Discharge Planning: Goal: Ability to manage health-related needs will improve Outcome: Progressing   Problem: Clinical Measurements: Goal: Will remain free from infection Outcome: Progressing

## 2024-02-11 NOTE — Plan of Care (Signed)
 ?  Problem: Education: ?Goal: Understanding of cardiac disease, CV risk reduction, and recovery process will improve ?Outcome: Progressing ?  ?Problem: Cardiac: ?Goal: Ability to achieve and maintain adequate cardiovascular perfusion will improve ?Outcome: Progressing ?  ?Problem: Health Behavior/Discharge Planning: ?Goal: Ability to safely manage health-related needs after discharge will improve ?Outcome: Progressing ?  ?

## 2024-02-11 NOTE — Progress Notes (Signed)
 ANTICOAGULATION CONSULT NOTE  Pharmacy Consult for heparin  infusion Indication: ACS/STEMI  Allergies  Allergen Reactions   Penicillin G Anaphylaxis and Other (See Comments)    Has patient had a PCN reaction causing immediate rash, facial/tongue/throat swelling, SOB or lightheadedness with hypotension: [id number]} Has patient had a PCN reaction causing severe rash involving mucus membranes or skin necrosis: no:[id number]} Has patient had a PCN reaction that required hospitalization no:[id number]} Has patient had a PCN reaction occurring within the last 10 years: no:[id number]} If all of the above answers are NO, then may proceed with Cephalosporin use.   Penicillins     Other reaction(s): Not available    Patient Measurements: Height: 5' 7 (170.2 cm) Weight: 101.2 kg (223 lb) IBW/kg (Calculated) : 61.6 HEPARIN  DW (KG): 84.2  Vital Signs: Temp: 97.8 F (36.6 C) (08/09 0409) BP: 102/63 (08/09 0409) Pulse Rate: 62 (08/09 0409)  Labs: Recent Labs    02/09/24 2201 02/09/24 2210 02/09/24 2359 02/10/24 0728 02/10/24 0923 02/10/24 1028 02/10/24 1258 02/11/24 0402  HGB 12.1  --   --   --   --   --   --  11.4*  HCT 37.0  --   --   --   --   --   --  35.9*  PLT 245  --   --   --   --   --   --  218  APTT  --  34  --   --   --   --   --   --   LABPROT  --  14.8  --   --   --   --   --   --   INR  --  1.1  --   --   --   --   --   --   HEPARINUNFRC  --   --   --  0.39  --   --  0.44 0.39  CREATININE 0.70  --   --   --   --   --   --  1.28*  TROPONINIHS 178*  --  192*  --  164* 169*  --   --    Estimated Creatinine Clearance: 52.8 mL/min (A) (by C-G formula based on SCr of 1.28 mg/dL (H)).  Medical History: Past Medical History:  Diagnosis Date   Asthma    Coronary artery disease    Diabetes mellitus without complication (HCC)    Heart attack (HCC)    Hyperlipidemia    Hypertension    MI (myocardial infarction) (HCC)    Migraine headache with aura    Ovarian neoplasm     BRCA negative   Assessment: Pt is a 68 yo female with hypertension, type 2 diabetes, hyperlipidemia, and CAD with reported 7 stents presenting to ED c/o CP, found with elevated Troponin I level. Pharmacy consulted to initiate and manage heparin  infusion for ACS.  Baseline CBC appropriate to initiate heparin  infusion. CrCl 84.73ml/min.  Date Time  Results Comments 8/8 0728 HL=0.39 Therapeutic x 1, rate 1100 u/h 8/8 1258 HL=0.44 Therapeutic x 2 8/9 0402 HL 0.39 Therapeutic x 3  Goal of Therapy:  Heparin  level 0.3-0.7 units/ml Monitor platelets by anticoagulation protocol: Yes   Plan:  HL remains therapeutic  Continue heparin  infusion rate at 1100 units/hr Heparin  infusion to end 8/9 per cardiology Recheck HL daily with AM labs if heparin  infusion not discontinued  CBC daily while on heparin   Rank[place] CANDIE Dills, PharmD, [facility] 02/11/2024 5:30 AM

## 2024-02-12 DIAGNOSIS — I214 Non-ST elevation (NSTEMI) myocardial infarction: Secondary | ICD-10-CM | POA: Diagnosis not present

## 2024-02-12 DIAGNOSIS — R079 Chest pain, unspecified: Secondary | ICD-10-CM | POA: Diagnosis not present

## 2024-02-12 LAB — BASIC METABOLIC PANEL WITH GFR
Anion gap: 14 (ref 5–15)
BUN: 18 mg/dL (ref 8–23)
CO2: 23 mmol/L (ref 22–32)
Calcium: 9.4 mg/dL (ref 8.9–10.3)
Chloride: 98 mmol/L (ref 98–111)
Creatinine, Ser: 0.81 mg/dL (ref 0.44–1.00)
GFR, Estimated: >60 mL/min (ref >60)
Glucose, Bld: 122 mg/dL — ABNORMAL HIGH (ref 70–99)
Potassium: 4.5 mmol/L (ref 3.5–5.1)
Sodium: 135 mmol/L (ref 135–145)

## 2024-02-12 LAB — CBC
HCT: 36.6 % (ref 36.0–46.0)
Hemoglobin: 12 g/dL (ref 12.0–15.0)
MCH: 26.3 pg (ref 26.0–34.0)
MCHC: 32.8 g/dL (ref 30.0–36.0)
MCV: 80.3 fL (ref 80.0–100.0)
Platelets: 268 K/uL (ref 150–400)
RBC: 4.56 MIL/uL (ref 3.87–5.11)
RDW: 16.3 % — ABNORMAL HIGH (ref 11.5–15.5)
WBC: 11.5 K/uL — ABNORMAL HIGH (ref 4.0–10.5)
nRBC: 0 % (ref 0.0–0.2)

## 2024-02-12 LAB — GLUCOSE, CAPILLARY: Glucose-Capillary: 199 mg/dL — ABNORMAL HIGH (ref 70–99)

## 2024-02-12 MED ORDER — DICLOFENAC SODIUM 1 % EX GEL
4.0000 g | Freq: Four times a day (QID) | CUTANEOUS | Status: DC
  Administered 2024-02-12 – 2024-02-14 (×11): 4 g via TOPICAL
  Filled 2024-02-12: qty 100

## 2024-02-12 MED ORDER — MORPHINE SULFATE (PF) 2 MG/ML IV SOLN
1.0000 mg | Freq: Once | INTRAVENOUS | Status: AC
  Administered 2024-02-12: 1 mg via INTRAVENOUS
  Filled 2024-02-12 (×2): qty 1

## 2024-02-12 MED ORDER — AMLODIPINE BESYLATE 5 MG PO TABS
5.0000 mg | ORAL_TABLET | Freq: Every day | ORAL | Status: DC
  Administered 2024-02-12 – 2024-02-13 (×3): 5 mg via ORAL
  Filled 2024-02-12 (×3): qty 1

## 2024-02-12 MED ORDER — ENOXAPARIN SODIUM 60 MG/0.6ML IJ SOSY
0.5000 mg/kg | PREFILLED_SYRINGE | INTRAMUSCULAR | Status: DC
  Administered 2024-02-12 – 2024-02-13 (×3): 50 mg via SUBCUTANEOUS
  Filled 2024-02-12 (×2): qty 0.6

## 2024-02-12 NOTE — Progress Notes (Signed)
 Patient called using bathroom call light. Patient found sitting on floor. Patient states she attempted to stand from toilet and got dizzy. Slid down wall and sat on ground. Endorses she did not hit her head. Patient x3 assist to stand back up and placed in wheelchair. Vitals signs WNL. Neurologically intact. Bed alarm now on and instructed patient not to get up unattended.  Husband Lynwood called and notified. Night coverage hospitalitis notified.

## 2024-02-12 NOTE — Progress Notes (Signed)
 [facility] CLINIC CARDIOLOGY PROGRESS NOTE       Patient ID: Felicia Woods MRN: [id number] DOB/AGE: 1956/02/26 68 y.o.  Admit date: 02/09/2024 Referring Physician Dr. Lawence Primary Physician McDonough, Tinnie POUR, PA-C Primary Cardiologist Dr. Florencio Reason for Consultation Chest pain  HPI: Felicia Woods is a 68 y.o. female  with a past medical history of coronary artery disease s/p CABGx4, multiple stents, chronic angina, hypertension, hyperlipidemia, type 2 diabetes mellitus, chronic HFpEF, obesity, asthma, COPD who presented to the ED on 02/09/2024 for chest pain. Cardiology was consulted for further evaluation.   Interval history: -Patient seen and examined this AM, resting in hospital bed with husband at bedside.  -Continues to report chest discomfort, had fall overnight.  -BP elevated this AM. HR stable.  -Repeating BMP this AM to assess Cr.   Pertinent Cardiac History (Most recent) LHC 06/2022 with Dr. Florencio   Mid LAD to [facility] LAD lesion is 90% stenosed.   Ramus-1 lesion is 90% stenosed.   Ramus-2 lesion is 100% stenosed.   Ost Cx to Prox Cx lesion is 90% stenosed.   Prox RCA lesion is 95% stenosed.   Dist RCA lesion is 100% stenosed.   Prox Graft to Mid Graft lesion is 10% stenosed.   Origin to Prox Graft lesion is 100% stenosed.   Origin to Prox Graft lesion is 10% stenosed.   Mid Graft lesion is 65% stenosed.   Origin lesion is 100% stenosed.   Prox Graft lesion is 95% stenosed.   Mid Graft lesion is 85% stenosed.   Origin to Prox Graft lesion is 65% stenosed.   Dist Graft lesion is 80% stenosed.   Non-stenotic Origin lesion was previously treated.   Non-stenotic Mid Graft lesion was previously treated.   Non-stenotic Dist Graft lesion was previously treated.   Non-stenotic Origin lesion was previously treated.   SVG.   There is mild left ventricular systolic dysfunction.   LV end diastolic pressure is normal.   The left ventricular ejection fraction is 50-55%  by visual estimate.  Conclusion: No significant change from previous cath Intervention was deferred because no indication Patient is to be treated medically for chronic stable angina Mynx deployed Recommend aggressive medical management    Review of systems complete and found to be negative unless listed above    Past Medical History:  Diagnosis Date   Asthma    Coronary artery disease    Diabetes mellitus without complication (HCC)    Heart attack (HCC)    Hyperlipidemia    Hypertension    MI (myocardial infarction) (HCC)    Migraine headache with aura    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    Past Surgical History:  Procedure Laterality Date   ABDOMINAL HYSTERECTOMY     CARDIAC CATHETERIZATION     CARDIAC CATHETERIZATION N/A 10/18/2015   Procedure: Left Heart Cath and Cors/Grafts Angiography;  Surgeon: Dorn JINNY Lesches, MD;  Location: [facility] INVASIVE CV LAB;  Service: Cardiovascular;  Laterality: N/A;   CARDIAC CATHETERIZATION N/A 10/18/2015   Procedure: Coronary Stent Intervention;  Surgeon: Dorn JINNY Lesches, MD;  Location: [facility] INVASIVE CV LAB;  Service: Cardiovascular;  Laterality: N/A;   CARDIAC SURGERY     CHOLECYSTECTOMY     COLONOSCOPY WITH PROPOFOL  N/A 12/02/2021   Procedure: COLONOSCOPY WITH PROPOFOL ;  Surgeon: Therisa Bi, MD;  Location: [facility] ENDOSCOPY;  Service: Gastroenterology;  Laterality: N/A;   CORONARY ANGIOPLASTY     CORONARY ARTERY BYPASS GRAFT     4 vessels -  2010   CORONARY STENT INTERVENTION N/A 02/16/2017   Procedure: CORONARY STENT INTERVENTION;  Surgeon: Florencio Cara BIRCH, MD;  Location: [facility] INVASIVE CV LAB;  Service: Cardiovascular;  Laterality: N/A;   CORONARY STENT INTERVENTION N/A 02/13/2019   Procedure: CORONARY STENT INTERVENTION;  Surgeon: Mady Bruckner, MD;  Location: [facility] INVASIVE CV LAB;  Service: Cardiovascular;  Laterality: N/A;  SVG to RCA   CORONARY STENT INTERVENTION Left 03/08/2019   Procedure: CORONARY STENT INTERVENTION;  Surgeon:  Florencio Cara BIRCH, MD;  Location: [facility] INVASIVE CV LAB;  Service: Cardiovascular;  Laterality: Left;   CORONARY STENT INTERVENTION N/A 11/04/2020   Procedure: CORONARY STENT INTERVENTION;  Surgeon: Mady Bruckner, MD;  Location: [facility] INVASIVE CV LAB;  Service: Cardiovascular;  Laterality: N/A;   CORONARY STENT INTERVENTION N/A 12/24/2021   Procedure: CORONARY STENT INTERVENTION;  Surgeon: Lawyer Bernardino Cough, MD;  Location: [facility] INVASIVE CV LAB;  Service: Cardiovascular;  Laterality: N/A;   ESOPHAGOGASTRODUODENOSCOPY N/A 12/02/2021   Procedure: ESOPHAGOGASTRODUODENOSCOPY (EGD);  Surgeon: Therisa Bi, MD;  Location: [facility] ENDOSCOPY;  Service: Gastroenterology;  Laterality: N/A;   LEFT HEART CATH AND CORONARY ANGIOGRAPHY Left 02/16/2017   Procedure: LEFT HEART CATH AND CORONARY ANGIOGRAPHY;  Surgeon: Hester Wolm PARAS, MD;  Location: [facility] INVASIVE CV LAB;  Service: Cardiovascular;  Laterality: Left;   LEFT HEART CATH AND CORONARY ANGIOGRAPHY Left 12/24/2021   Procedure: LEFT HEART CATH AND CORONARY ANGIOGRAPHY;  Surgeon: Hester Wolm PARAS, MD;  Location: [facility] INVASIVE CV LAB;  Service: Cardiovascular;  Laterality: Left;   LEFT HEART CATH AND CORONARY ANGIOGRAPHY Left 06/17/2022   Procedure: LEFT HEART CATH AND CORONARY ANGIOGRAPHY;  Surgeon: Florencio Cara BIRCH, MD;  Location: [facility] INVASIVE CV LAB;  Service: Cardiovascular;  Laterality: Left;   LEFT HEART CATH AND CORS/GRAFTS ANGIOGRAPHY Left 11/23/2017   Procedure: LEFT HEART CATH AND CORS/GRAFTS ANGIOGRAPHY;  Surgeon: Hester Wolm PARAS, MD;  Location: [facility] INVASIVE CV LAB;  Service: Cardiovascular;  Laterality: Left;   LEFT HEART CATH AND CORS/GRAFTS ANGIOGRAPHY N/A 02/13/2019   Procedure: LEFT HEART CATH AND CORS/GRAFTS ANGIOGRAPHY;  Surgeon: Hester Wolm PARAS, MD;  Location: [facility] INVASIVE CV LAB;  Service: Cardiovascular;  Laterality: N/A;   LEFT HEART CATH AND CORS/GRAFTS ANGIOGRAPHY N/A 07/03/2019   Procedure: LEFT HEART CATH AND CORS/GRAFTS  ANGIOGRAPHY;  Surgeon: Hester Wolm PARAS, MD;  Location: [facility] INVASIVE CV LAB;  Service: Cardiovascular;  Laterality: N/A;   LEFT HEART CATH AND CORS/GRAFTS ANGIOGRAPHY N/A 11/04/2020   Procedure: LEFT HEART CATH AND CORS/GRAFTS ANGIOGRAPHY;  Surgeon: Hester Wolm PARAS, MD;  Location: [facility] INVASIVE CV LAB;  Service: Cardiovascular;  Laterality: N/A;    Medications Prior to Admission  Medication Sig Dispense Refill Last Dose/Taking   aspirin  EC 81 MG tablet Take 81 mg by mouth daily.   Past Week   atorvastatin  (LIPITOR ) 40 MG tablet Take 1 tablet (40 mg total) by mouth daily. 90 tablet 3 Past Week   Budeson-Glycopyrrol-Formoterol  (BREZTRI  AEROSPHERE) 160-9-4.8 MCG/ACT AERO Inhale 2 puffs into the lungs 2 (two) times daily. 10.7 g 11 Past Week   empagliflozin  (JARDIANCE ) 25 MG TABS tablet Take one tab a day for diabetes 90 tablet 3 Past Week   escitalopram  (LEXAPRO ) 10 MG tablet Take 1 tablet (10 mg total) by mouth daily. 30 tablet 2 Past Week   famotidine  (PEPCID ) 20 MG tablet Take 1 tablet (20 mg total) by mouth 2 (two) times daily. 90 tablet 1 Past Week   furosemide  (LASIX ) 40 MG tablet Take 1 tablet (40 mg total) by mouth 2 (  two) times daily. 180 tablet 1 Past Week   hydrALAZINE  (APRESOLINE ) 10 MG tablet Take 1 tablet (10 mg total) by mouth 3 (three) times daily. 90 tablet 2 Past Week   losartan  (COZAAR ) 100 MG tablet Take 1 tablet (100 mg total) by mouth daily. 90 tablet 3 Past Week   methocarbamol  (ROBAXIN ) 500 MG tablet Take 500 mg by mouth 3 (three) times daily as needed for muscle spasms.   Past Week   montelukast  (SINGULAIR ) 10 MG tablet Take 1 tablet (10 mg total) by mouth at bedtime. 90 tablet 1 Past Week   nitroGLYCERIN  (NITRODUR - DOSED IN MG/24 HR) 0.1 mg/hr patch Place 0.1 mg onto the skin daily.   Past Week   nitroGLYCERIN  (NITROSTAT ) 0.4 MG SL tablet DISSOLVE ONE TABLET UNDER THE TONGUE EVERY 5 MINUTES AS NEEDED FOR CHEST PAIN.  DO NOT EXCEED A TOTAL OF 3 DOSES IN 15 MINUTES 30 tablet  3 Past Week   oxybutynin  (DITROPAN ) 5 MG tablet Take 1 tablet (5 mg total) by mouth daily. 90 tablet 1 Past Week   PROAIR  RESPICLICK 108 (90 Base) MCG/ACT AEPB Inhale 2 puffs into the lungs every 6 (six) hours as needed. 3 each 1 Unknown   tirzepatide  (MOUNJARO ) 2.5 MG/0.5ML Pen Inject 2.5 mg into the skin once a week. 2 mL 2 02/03/2024   traMADol  (ULTRAM ) 50 MG tablet Take 50 mg by mouth every 6 (six) hours as needed. for pain   Past Week   zolpidem  (AMBIEN ) 5 MG tablet Take 1 tablet (5 mg total) by mouth at bedtime as needed. for sleep 30 tablet 2 Past Week   Blood Glucose Monitoring Suppl (ONETOUCH VERIO FLEX SYSTEM) w/Device KIT Use as directed twice a daily DX E11.65 1 kit 0    glucose blood (ONETOUCH VERIO) test strip Use as instructed twice daily DX E11.65 100 each 3    Misc. Devices (ADJUST BATH/SHOWER SEAT) MISC 1 shower seat for use at home to reduce risk of falling. 1 each 0    OneTouch Delica Lancets 30G MISC USE 1  TO CHECK GLUCOSE TWICE DAILY AS DIRECTED 100 each 0    Social History   Socioeconomic History   Marital status: Widowed    Spouse name: lynwood   Number of children: Not on file   Years of education: Not on file   Highest education level: Not on file  Occupational History   Not on file  Tobacco Use   Smoking status: Never   Smokeless tobacco: Never  Vaping Use   Vaping status: Never Used  Substance and Sexual Activity   Alcohol use: No    Alcohol/week: 0.0 standard drinks of alcohol   Drug use: No   Sexual activity: Yes  Other Topics Concern   Not on file  Social History Narrative   Not on file   Social Drivers of Health   Financial Resource Strain: Low Risk  (01/27/2024)   Received from [facility]   Overall Financial Resource Strain (CARDIA)    Difficulty of Paying Living Expenses: Not very hard  Food Insecurity: No Food Insecurity (02/10/2024)   Hunger Vital Sign    Worried About Running Out of Food in the Last Year: Never true    Ran  Out of Food in the Last Year: Never true  Recent Concern: Food Insecurity - Food Insecurity Present (01/27/2024)   Received from [facility]   Hunger Vital Sign    Within the past 12 months,  you worried that your food would run out before you got the money to buy more.: Sometimes true    Within the past 12 months, the food you bought just didn't last and you didn't have money to get more.: Sometimes true  Transportation Needs: No Transportation Needs (02/10/2024)   PRAPARE - Administrator, Civil Service (Medical): No    Lack of Transportation (Non-Medical): No  Physical Activity: Not on file  Stress: Not on file  Social Connections: Socially Integrated (02/10/2024)   Social Connection and Isolation Panel    Frequency of Communication with Friends and Family: Three times a week    Frequency of Social Gatherings with Friends and Family: Three times a week    Attends Religious Services: More than 4 times per year    Active Member of Clubs or Organizations: No    Attends Banker Meetings: 1 to 4 times per year    Marital Status: Married  Catering manager Violence: Not At Risk (02/10/2024)   Humiliation, Afraid, Rape, and Kick questionnaire    Fear of Current or Ex-Partner: No    Emotionally Abused: No    Physically Abused: No    Sexually Abused: No    Family History  Problem Relation Age of Onset   Diabetes Mother    Diabetes Father    Cancer Father    Diabetes Brother      Vitals:   02/12/24 0356 02/12/24 0448 02/12/24 0507 02/12/24 0828  BP: (!) 161/110 (!) 163/106 (!) 138/98 (!) 146/93  Pulse: 66  63 72  Resp: 20  20   Temp: 98 F (36.7 C)  (!) 97.5 F (36.4 C) 98 F (36.7 C)  TempSrc:      SpO2: 100%  96% 98%  Weight:      Height:        PHYSICAL EXAM General: Chronically ill-appearing elderly female, well nourished, in no acute distress. HEENT: Normocephalic and atraumatic. Neck: No JVD.   Lungs: Normal respiratory effort on  room air. Clear bilaterally to auscultation. No wheezes, crackles, rhonchi.  Heart: HRRR. Normal S1 and S2 without gallops or murmurs.  Abdomen: Non-distended appearing.  Msk: Normal strength and tone for age. Extremities: Warm and well perfused. No clubbing, cyanosis, edema.  Neuro: Alert and oriented X 3. Psych: Answers questions appropriately.   Labs: Basic Metabolic Panel: Recent Labs    02/09/24 2201 02/11/24 0402  NA 136 136  K 3.5 4.1  CL 103 101  CO2 24 26  GLUCOSE 144* 171*  BUN 19 23  CREATININE 0.70 1.28*  CALCIUM  9.0 9.1   Liver Function Tests: Recent Labs    02/09/24 2201  AST 22  ALT 15  ALKPHOS 60  BILITOT 0.6  PROT 7.3  ALBUMIN 3.4*   No results for input(s): LIPASE, AMYLASE in the last 72 hours. CBC: Recent Labs    02/09/24 2201 02/11/24 0402  WBC 9.9 8.1  NEUTROABS 6.5  --   HGB 12.1 11.4*  HCT 37.0 35.9*  MCV 79.7* 82.5  PLT 245 218   Cardiac Enzymes: Recent Labs    02/09/24 2359 02/10/24 0923 02/10/24 1028  TROPONINIHS 192* 164* 169*   BNP: Recent Labs    02/09/24 2201  BNP 99.1   D-Dimer: No results for input(s): DDIMER in the last 72 hours. Hemoglobin A1C: No results for input(s): HGBA1C in the last 72 hours. Fasting Lipid Panel: Recent Labs    02/10/24 0459  CHOL 203*  HDL  42  LDLCALC 147*  TRIG 70  CHOLHDL 4.8   Thyroid  Function Tests: No results for input(s): TSH, T4TOTAL, T3FREE, THYROIDAB in the last 72 hours.  Invalid input(s): FREET3 Anemia Panel: No results for input(s): VITAMINB12, FOLATE, FERRITIN, TIBC, IRON, RETICCTPCT in the last 72 hours.   Radiology: ECHOCARDIOGRAM COMPLETE Result Date: 02/11/2024    ECHOCARDIOGRAM REPORT   Patient Name:   Kazue Manuelito Date of Exam: 02/10/2024 Medical Rec #:  [id number]      Height:       67.0 in Accession #:    [id number]     Weight:       223.0 lb Date of Birth:  January 13, 1956     BSA:          2.118 m Patient Age:    67 years       BP:            102/84 mmHg Patient Gender: F              HR:           61 bpm. Exam Location:  [facility] Procedure: 2D Echo, Cardiac Doppler and Color Doppler (Both Spectral and Color            Flow Doppler were utilized during procedure). Indications:     Chest Pain R07.9  History:         Patient has prior history of Echocardiogram examinations, most                  recent 04/07/2022. CHF, Previous Myocardial Infarction, Acute                  MI, CAD and Angina, Prior CABG, Signs/Symptoms:Shortness of                  Breath; Risk Factors:Diabetes and Dyslipidemia.  Sonographer:     Thea Norlander RCS Referring Phys:  [id number] DORENE COMFORT Diagnosing Phys: Cara JONETTA Lovelace MD IMPRESSIONS  1. Left ventricular ejection fraction, by estimation, is 55 to 60%. The left ventricle has normal function. The left ventricle has no regional wall motion abnormalities. Left ventricular diastolic parameters were normal.  2. Right ventricular systolic function is normal. The right ventricular size is normal.  3. The mitral valve is normal in structure. Trivial mitral valve regurgitation.  4. The aortic valve is normal in structure. Aortic valve regurgitation is not visualized. FINDINGS  Left Ventricle: Left ventricular ejection fraction, by estimation, is 55 to 60%. The left ventricle has normal function. The left ventricle has no regional wall motion abnormalities. Strain was performed and the global longitudinal strain is indeterminate. The left ventricular internal cavity size was normal in size. There is no left ventricular hypertrophy. Left ventricular diastolic parameters were normal. Right Ventricle: The right ventricular size is normal. No increase in right ventricular wall thickness. Right ventricular systolic function is normal. Left Atrium: Left atrial size was normal in size. Right Atrium: Right atrial size was normal in size. Pericardium: There is no evidence of pericardial effusion. Mitral Valve: The mitral valve is  normal in structure. Trivial mitral valve regurgitation. Tricuspid Valve: The tricuspid valve is normal in structure. Tricuspid valve regurgitation is not demonstrated. Aortic Valve: The aortic valve is normal in structure. Aortic valve regurgitation is not visualized. Aortic valve peak gradient measures 4.2 mmHg. Pulmonic Valve: The pulmonic valve was normal in structure. Pulmonic valve regurgitation is not visualized. Aorta: The ascending aorta was not well visualized. IAS/Shunts: No  atrial level shunt detected by color flow Doppler. Additional Comments: 3D was performed not requiring image post processing on an independent workstation and was indeterminate.  LEFT VENTRICLE PLAX 2D LVIDd:         3.80 cm   Diastology LVIDs:         2.70 cm   LV e' medial:    4.24 cm/s LV PW:         1.05 cm   LV E/e' medial:  15.4 LV IVS:        0.80 cm   LV e' lateral:   7.72 cm/s LVOT diam:     1.90 cm   LV E/e' lateral: 8.5 LV SV:         37 LV SV Index:   17 LVOT Area:     2.84 cm  RIGHT VENTRICLE            IVC RV S prime:     4.20 cm/s  IVC diam: 1.60 cm LEFT ATRIUM         Index LA diam:    3.55 cm 1.68 cm/m  AORTIC VALVE AV Area (Vmax): 2.20 cm AV Vmax:        102.00 cm/s AV Peak Grad:   4.2 mmHg LVOT Vmax:      79.10 cm/s LVOT Vmean:     49.800 cm/s LVOT VTI:       0.129 m  AORTA Ao Root diam: 2.90 cm Ao Asc diam:  3.00 cm MITRAL VALVE MV Area (PHT): 3.99 cm    SHUNTS MV Decel Time: 190 msec    Systemic VTI:  0.13 m MV E velocity: 65.50 cm/s  Systemic Diam: 1.90 cm MV A velocity: 56.60 cm/s MV E/A ratio:  1.16 Dwayne JONETTA Lovelace MD Electronically signed by Cara JONETTA Lovelace MD Signature Date/Time: 02/11/2024/11:13:05 AM    Final    DG Chest Port 1 View Result Date: 02/09/2024 CLINICAL DATA:  Chest pain EXAM: PORTABLE CHEST 1 VIEW COMPARISON:  04/06/2022 FINDINGS: Stable cardiomediastinal silhouette. Aortic atherosclerotic calcification. Sternotomy. Bibasilar airspace opacities favor atelectasis. No pleural effusion or  pneumothorax. IMPRESSION: Bibasilar airspace opacities favor atelectasis. Electronically Signed   By: Norman Gatlin M.D.   On: 02/09/2024 22:14    ECHO as above.  TELEMETRY reviewed by me 02/12/2024: sinsu bradycardia 50s  EKG reviewed by me: sinus rhythm, RBBB rate 76 bpm, without acute ischemic changes, similar to prior EKGs  Data reviewed by me 02/12/2024: last 24h vitals tele labs imaging I/O hospitalist progress note  Principal Problem:   NSTEMI (non-ST elevated myocardial infarction) (HCC) Active Problems:   Essential hypertension   Depression   Dyslipidemia   GERD without esophagitis   Asthma, chronic    ASSESSMENT AND PLAN:  Felicia Woods is a 68 y.o. female  with a past medical history of coronary artery disease s/p CABG, multiple stents, chronic angina, hypertension, hyperlipidemia, type 2 diabetes mellitus, chronic HFpEF, obesity, asthma, COPD who presented to the ED on 02/09/2024 for chest pain. Cardiology was consulted for further evaluation.  # Chronic Angina, mildly and flat elevated troponin # Coronary artery disease s/p CABG x 4, multiple stents # Hypertension # Hyperlipidemia  Patient presents with chest pain. Patient not taken her metoprolol , Imdur  or Ranexa  for at least the past week.  Discussed plan for either medical management with resuming her antianginals or proceeding with LHC.  Patient states she does not want to have another left heart cath since she has had many before  and prefers to manage anginal symptoms with medication.  She states her chest pain has improved since yesterday. EKG in ED with sinus rhythm, RBBB rate 76 bpm, without acute ischemic changes, similar to prior EKGs.  Troponins mildly elevated and flat 178 > 192 > 164.  Lipid panel with elevated cholesterol at 203, elevated LDL 147.  Echo this admission with EF 55-60%, no WMAs, normal diastolic function, trivial MR.  -Continue aspirin  81 mg daily.  -Continue atorvastatin  to 80 mg daily. Recommend  goal LDL < 50. -Continue losartan  50 mg daily.  -Start amlodipine  5 mg daily. Continue Imdur  120 mg twice daily, Ranexa  1000 mg twice daily, metoprolol  succinate 100 mg daily. Uptitration of metoprolol  limited due to bradycardia.   # Chronic HFpEF Without SOB or lower extremity swelling.  BNP within normal limits.  Chest x-ray negative.  Appears euvolemic on exam. -Hold home lasix  to due bump in creatinine.  -Continue home dapagliflozin  25 mg daily. -Losartan , metoprolol  as stated above.   This patient's plan of care was discussed and created with Dr. Florencio and he is in agreement.  Signed: Danita Bloch, PA-C  02/12/2024, 8:58 AM [facility]

## 2024-02-12 NOTE — Progress Notes (Addendum)
 [facility]  - [place] at [facility]   PATIENT NAME: Felicia Woods    MR#:  [id number]  DATE OF BIRTH:  1956-05-03  SUBJECTIVE:  patient seen earlier. Husband at bedside. RN reported pt slipped in the BR at night. CP and then got morphine . Has pain on palpation left upper chest   VITALS:  Blood pressure 117/72, pulse 61, temperature 97.9 F (36.6 C), resp. rate 20, height 5' 7 (1.702 m), weight 101.2 kg, SpO2 97%.  PHYSICAL EXAMINATION:   GENERAL:  68 y.o.-year-old patient with no acute distress.  LUNGS: Normal breath sounds bilaterally, no wheezing CARDIOVASCULAR: S1, S2 normal. No murmur  pain on palpation ABDOMEN: Soft, nontender, nondistended.  EXTREMITIES: No  edema b/l.    NEUROLOGIC: nonfocal  patient is alert and awake SKIN: No obvious rash, lesion, or ulcer.   LABORATORY PANEL:  CBC Recent Labs  Lab 02/12/24 0854  WBC 11.5*  HGB 12.0  HCT 36.6  PLT 268    Chemistries  Recent Labs  Lab 02/09/24 2201 02/11/24 0402 02/12/24 0854  NA 136   < > 135  K 3.5   < > 4.5  CL 103   < > 98  CO2 24   < > 23  GLUCOSE 144*   < > 122*  BUN 19   < > 18  CREATININE 0.70   < > 0.81  CALCIUM  9.0   < > 9.4  AST 22  --   --   ALT 15  --   --   ALKPHOS 60  --   --   BILITOT 0.6  --   --    < > = values in this interval not displayed.   Cardiac Enzymes No results for input(s): TROPONINI in the last 168 hours. RADIOLOGY:  ECHOCARDIOGRAM COMPLETE Result Date: 02/11/2024    ECHOCARDIOGRAM REPORT   Patient Name:   Felicia Woods Date of Exam: 02/10/2024 Medical Rec #:  [id number]      Height:       67.0 in Accession #:    [id number]     Weight:       223.0 lb Date of Birth:  10-Nov-1955     BSA:          2.118 m Patient Age:    67 years       BP:           102/84 mmHg Patient Gender: F              HR:           61 bpm. Exam Location:  [facility] Procedure: 2D Echo, Cardiac Doppler and Color Doppler (Both Spectral and Color            Flow Doppler were utilized  during procedure). Indications:     Chest Pain R07.9  History:         Patient has prior history of Echocardiogram examinations, most                  recent 04/07/2022. CHF, Previous Myocardial Infarction, Acute                  MI, CAD and Angina, Prior CABG, Signs/Symptoms:Shortness of                  Breath; Risk Factors:Diabetes and Dyslipidemia.  Sonographer:     Thea Norlander RCS Referring Phys:  [id number] DORENE COMFORT Diagnosing Phys: Cara JONETTA Lovelace MD IMPRESSIONS  1. Left ventricular ejection fraction, by estimation, is 55 to 60%. The left ventricle has normal function. The left ventricle has no regional wall motion abnormalities. Left ventricular diastolic parameters were normal.  2. Right ventricular systolic function is normal. The right ventricular size is normal.  3. The mitral valve is normal in structure. Trivial mitral valve regurgitation.  4. The aortic valve is normal in structure. Aortic valve regurgitation is not visualized. FINDINGS  Left Ventricle: Left ventricular ejection fraction, by estimation, is 55 to 60%. The left ventricle has normal function. The left ventricle has no regional wall motion abnormalities. Strain was performed and the global longitudinal strain is indeterminate. The left ventricular internal cavity size was normal in size. There is no left ventricular hypertrophy. Left ventricular diastolic parameters were normal. Right Ventricle: The right ventricular size is normal. No increase in right ventricular wall thickness. Right ventricular systolic function is normal. Left Atrium: Left atrial size was normal in size. Right Atrium: Right atrial size was normal in size. Pericardium: There is no evidence of pericardial effusion. Mitral Valve: The mitral valve is normal in structure. Trivial mitral valve regurgitation. Tricuspid Valve: The tricuspid valve is normal in structure. Tricuspid valve regurgitation is not demonstrated. Aortic Valve: The aortic valve is normal in  structure. Aortic valve regurgitation is not visualized. Aortic valve peak gradient measures 4.2 mmHg. Pulmonic Valve: The pulmonic valve was normal in structure. Pulmonic valve regurgitation is not visualized. Aorta: The ascending aorta was not well visualized. IAS/Shunts: No atrial level shunt detected by color flow Doppler. Additional Comments: 3D was performed not requiring image post processing on an independent workstation and was indeterminate.  LEFT VENTRICLE PLAX 2D LVIDd:         3.80 cm   Diastology LVIDs:         2.70 cm   LV e' medial:    4.24 cm/s LV PW:         1.05 cm   LV E/e' medial:  15.4 LV IVS:        0.80 cm   LV e' lateral:   7.72 cm/s LVOT diam:     1.90 cm   LV E/e' lateral: 8.5 LV SV:         37 LV SV Index:   17 LVOT Area:     2.84 cm  RIGHT VENTRICLE            IVC RV S prime:     4.20 cm/s  IVC diam: 1.60 cm LEFT ATRIUM         Index LA diam:    3.55 cm 1.68 cm/m  AORTIC VALVE AV Area (Vmax): 2.20 cm AV Vmax:        102.00 cm/s AV Peak Grad:   4.2 mmHg LVOT Vmax:      79.10 cm/s LVOT Vmean:     49.800 cm/s LVOT VTI:       0.129 m  AORTA Ao Root diam: 2.90 cm Ao Asc diam:  3.00 cm MITRAL VALVE MV Area (PHT): 3.99 cm    SHUNTS MV Decel Time: 190 msec    Systemic VTI:  0.13 m MV E velocity: 65.50 cm/s  Systemic Diam: 1.90 cm MV A velocity: 56.60 cm/s MV E/A ratio:  1.16 Dwayne D Callwood MD Electronically signed by Cara JONETTA Lovelace MD Signature Date/Time: 02/11/2024/11:13:05 AM    Final     Assessment and Plan Felicia Woods is a 68 y.o. female with medical history significant for coronary artery  disease status post PCIs and 7 stents, type 2 diabetes mellitus, dyslipidemia, hypertension, asthma and migraine, who presented to the emergency room with acute onset of left upper chest pain with radiation to her left shoulder and associated numbness in the left shoulder and arm.   NSTEMI (non-ST elevated myocardial infarction) (HCC) Chronic Stable Angina - continue her on IV heparin   for 48 hours--completed -- on aspirin  and Plavix  and as needed sublingual nitroglycerin  and morphine  sulfate for pain. - resumed Ranexa  and Imdur  (pt was not taking) -  Toprol -XL, Lipitor . --decreased losartan  to 50 mg (creat up) -- Cardiology consultation with [facility] clinic-- recommends conservative management for now. - troponins 178--192--164--169 --cont with some CP--asking for morphine  --d/w Cards PA--will keep her NPO in case ?LHC   Essential hypertension -  continue antihypertensive therapy.   Dyslipidemia -  continue statin therapy   Asthma, chronic - continue her inhalers.   GERD without esophagitis - on H2 blocker therapy.   Depression - continue Lexapro .       Procedures: Family communication :Husband Consults : [facility] cardiology CODE STATUS: full DVT Prophylaxis : lovenoxLevel of care: Progressive Status is: Inpatient Remains inpatient appropriate because:on going cp, cardiology adjust meds    TOTAL TIME TAKING CARE OF THIS PATIENT: 35 minutes.  >50% time spent on counselling and coordination of care  Note: This dictation was prepared with Dragon dictation along with smaller phrase technology. Any transcriptional errors that result from this process are unintentional.  Leita Blanch M.D    [facility]   CC: Primary care physician; Kristina Tinnie POUR, PA-C

## 2024-02-12 NOTE — Care Management Obs Status (Signed)
 MEDICARE OBSERVATION STATUS NOTIFICATION   Patient Details  Name: Felicia Woods MRN: [id number] Date of Birth: [birth year]   Medicare Observation Status Notification Given:  Yes    Francy Mcilvaine W, CMA 02/12/2024, 11:41 AM

## 2024-02-12 NOTE — Significant Event (Signed)
       CROSS COVER NOTE  NAME: Felicia Woods MRN: [id number] DOB : [birth year] ATTENDING PHYSICIAN: Tobie Calix, MD    Date of Service   02/12/2024   HPI/Events of Note   Notified by RN pt called in bathroom found on floor. said she attempted to get up and slid down to ground.still 10/10 chest pain and dizzy. vitals and CBG completed   Patient treated with prn nitroglycerin  through out day for recurrent angina Per card eval Pertinent Cardiac History (Most recent) LHC 06/2022 with Dr. Florencio   Mid LAD to [facility] LAD lesion is 90% stenosed.   Ramus-1 lesion is 90% stenosed.   Ramus-2 lesion is 100% stenosed.   Ost Cx to Prox Cx lesion is 90% stenosed.   Prox RCA lesion is 95% stenosed.   Dist RCA lesion is 100% stenosed.   Prox Graft to Mid Graft lesion is 10% stenosed.   Origin to Prox Graft lesion is 100% stenosed.   Origin to Prox Graft lesion is 10% stenosed.   Mid Graft lesion is 65% stenosed.   Origin lesion is 100% stenosed.   Prox Graft lesion is 95% stenosed.   Mid Graft lesion is 85% stenosed.   Origin to Prox Graft lesion is 65% stenosed.   Dist Graft lesion is 80% stenosed.   Non-stenotic Origin lesion was previously treated.   Non-stenotic Mid Graft lesion was previously treated.   Non-stenotic Dist Graft lesion was previously treated.   Non-stenotic Origin lesion was previously treated.   SVG.   There is mild left ventricular systolic dysfunction.   LV end diastolic pressure is normal.   The left ventricular ejection fraction is 50-55% by visual estimate.   Conclusion: No significant change from previous cath Intervention was deferred because no indication Patient is to be treated medically for chronic stable angina Mynx deployed Recommend aggressive medical management  Interventions   Assessment/Plan:    02/12/2024    3:56 AM 02/12/2024    2:43 AM 02/12/2024   12:16 AM  Vitals with BMI  Systolic 161 147 898  Diastolic 110 105 68  Pulse 66 57 62    EKG SB RBBB no STE no change from prior No evidence of injury Reported fall / slide to floor in bathroom  No apparent injuries noted  Prn sl nitroglycerin  (also on ranexa  and imdur ) Orthostatic bp in am High falls risk Morphine  1 mg x1 for chest pain unrelieved with nitro      Erminio LITTIE Cone NP [facility] Cross Cover 7pm-7am - check amion for availability Pager [phone]

## 2024-02-12 NOTE — Plan of Care (Signed)
  Problem: Cardiac: Goal: Ability to achieve and maintain adequate cardiovascular perfusion will improve Outcome: Progressing   Problem: Education: Goal: Knowledge of General Education information will improve Description: Including pain rating scale, medication(s)/side effects and non-pharmacologic comfort measures Outcome: Progressing   Problem: Clinical Measurements: Goal: Ability to maintain clinical measurements within normal limits will improve Outcome: Progressing Goal: Will remain free from infection Outcome: Progressing Goal: Diagnostic test results will improve Outcome: Progressing Goal: Respiratory complications will improve Outcome: Progressing Goal: Cardiovascular complication will be avoided Outcome: Not Progressing

## 2024-02-13 DIAGNOSIS — I214 Non-ST elevation (NSTEMI) myocardial infarction: Secondary | ICD-10-CM | POA: Diagnosis not present

## 2024-02-13 LAB — LIPOPROTEIN A (LPA): Lipoprotein (a): 506 nmol/L — ABNORMAL HIGH (ref <75.0)

## 2024-02-13 MED ORDER — FREE WATER
500.0000 mL | Freq: Once | Status: AC
  Administered 2024-02-14 (×2): 500 mL via ORAL

## 2024-02-13 MED ORDER — SODIUM CHLORIDE 0.9 % IV SOLN: INTRAVENOUS | Status: DC

## 2024-02-13 NOTE — TOC CM/SW Note (Signed)
 Transition of Care [facility]) - Inpatient Brief Assessment   Patient Details  Name: Felicia Woods MRN: [id number] Date of Birth: [birth year]  [facility] CM/SW Contact:    Lauraine JAYSON Carpen, LCSW Phone Number: 02/13/2024, 1:59 PM   Clinical Narrative: CSW reviewed chart. No TOC needs identified so far. CSW will continue to follow progress. Please place [facility] consult if any needs arise.  Transition of Care Asessment: Insurance and Status: Insurance coverage has been reviewed Patient has primary care physician: Yes Home environment has been reviewed: Single family home Prior level of function:: Not documented Prior/Current Home Services: No current home services Social Drivers of Health Review: SDOH reviewed no interventions necessary Readmission risk has been reviewed: Yes Transition of care needs: no transition of care needs at this time

## 2024-02-13 NOTE — Care Management Obs Status (Signed)
 MEDICARE OBSERVATION STATUS NOTIFICATION   Patient Details  Name: Karon Cotterill MRN: [id number] Date of Birth: 17-Mar-1956   Medicare Observation Status Notification Given:  Yes    Lauraine JAYSON Carpen, LCSW 02/13/2024, 8:37 AM

## 2024-02-13 NOTE — Care Management CC44 (Signed)
 Condition Code 44 Documentation Completed  Patient Details  Name: Aarion Kittrell MRN: [id number] Date of Birth: 05/22/1956   Condition Code 44 given:  Yes Patient signature on Condition Code 44 notice:  Yes Documentation of 2 MD's agreement:  Yes Code 44 added to claim:  Yes    Lauraine JAYSON Carpen, LCSW 02/13/2024, 8:37 AM

## 2024-02-13 NOTE — Progress Notes (Signed)
 [facility] CLINIC CARDIOLOGY PROGRESS NOTE       Patient ID: Felicia Woods MRN: [id number] DOB/AGE: July 31, 1955 67 y.o.  Admit date: 02/09/2024 Referring Physician Dr. Lawence Primary Physician McDonough, Tinnie POUR, PA-C Primary Cardiologist Dr. Florencio Reason for Consultation Chest pain  HPI: Felicia Woods is a 68 y.o. female  with a past medical history of coronary artery disease s/p CABGx4, multiple stents, chronic angina, hypertension, hyperlipidemia, type 2 diabetes mellitus, chronic HFpEF, obesity, asthma, COPD who presented to the ED on 02/09/2024 for chest pain. Cardiology was consulted for further evaluation.   Interval history: -Patient seen and examined this AM, resting in hospital bed in no acute distress. -Continues to report chest discomfort. Patient states overall her chest discomfort is improving but still 6/10.  -Patient denies SOB or palpitations. -BP and HR stable this AM   Pertinent Cardiac History (Most recent) LHC 06/2022 with Dr. Florencio   Mid LAD to [facility] LAD lesion is 90% stenosed.   Ramus-1 lesion is 90% stenosed.   Ramus-2 lesion is 100% stenosed.   Ost Cx to Prox Cx lesion is 90% stenosed.   Prox RCA lesion is 95% stenosed.   Dist RCA lesion is 100% stenosed.   Prox Graft to Mid Graft lesion is 10% stenosed.   Origin to Prox Graft lesion is 100% stenosed.   Origin to Prox Graft lesion is 10% stenosed.   Mid Graft lesion is 65% stenosed.   Origin lesion is 100% stenosed.   Prox Graft lesion is 95% stenosed.   Mid Graft lesion is 85% stenosed.   Origin to Prox Graft lesion is 65% stenosed.   Dist Graft lesion is 80% stenosed.   Non-stenotic Origin lesion was previously treated.   Non-stenotic Mid Graft lesion was previously treated.   Non-stenotic Dist Graft lesion was previously treated.   Non-stenotic Origin lesion was previously treated.   SVG.   There is mild left ventricular systolic dysfunction.   LV end diastolic pressure is normal.   The left  ventricular ejection fraction is 50-55% by visual estimate.  Conclusion: No significant change from previous cath Intervention was deferred because no indication Patient is to be treated medically for chronic stable angina Mynx deployed Recommend aggressive medical management    Review of systems complete and found to be negative unless listed above    Past Medical History:  Diagnosis Date   Asthma    Coronary artery disease    Diabetes mellitus without complication (HCC)    Heart attack (HCC)    Hyperlipidemia    Hypertension    MI (myocardial infarction) (HCC)    Migraine headache with aura    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    Past Surgical History:  Procedure Laterality Date   ABDOMINAL HYSTERECTOMY     CARDIAC CATHETERIZATION     CARDIAC CATHETERIZATION N/A 10/18/2015   Procedure: Left Heart Cath and Cors/Grafts Angiography;  Surgeon: Dorn JINNY Lesches, MD;  Location: [facility] INVASIVE CV LAB;  Service: Cardiovascular;  Laterality: N/A;   CARDIAC CATHETERIZATION N/A 10/18/2015   Procedure: Coronary Stent Intervention;  Surgeon: Dorn JINNY Lesches, MD;  Location: [facility] INVASIVE CV LAB;  Service: Cardiovascular;  Laterality: N/A;   CARDIAC SURGERY     CHOLECYSTECTOMY     COLONOSCOPY WITH PROPOFOL  N/A 12/02/2021   Procedure: COLONOSCOPY WITH PROPOFOL ;  Surgeon: Therisa Bi, MD;  Location: [facility] ENDOSCOPY;  Service: Gastroenterology;  Laterality: N/A;   CORONARY ANGIOPLASTY     CORONARY ARTERY BYPASS GRAFT  4 vessels - 2010   CORONARY STENT INTERVENTION N/A 02/16/2017   Procedure: CORONARY STENT INTERVENTION;  Surgeon: Florencio Cara BIRCH, MD;  Location: [facility] INVASIVE CV LAB;  Service: Cardiovascular;  Laterality: N/A;   CORONARY STENT INTERVENTION N/A 02/13/2019   Procedure: CORONARY STENT INTERVENTION;  Surgeon: Mady Bruckner, MD;  Location: [facility] INVASIVE CV LAB;  Service: Cardiovascular;  Laterality: N/A;  SVG to RCA   CORONARY STENT INTERVENTION Left 03/08/2019   Procedure:  CORONARY STENT INTERVENTION;  Surgeon: Florencio Cara BIRCH, MD;  Location: [facility] INVASIVE CV LAB;  Service: Cardiovascular;  Laterality: Left;   CORONARY STENT INTERVENTION N/A 11/04/2020   Procedure: CORONARY STENT INTERVENTION;  Surgeon: Mady Bruckner, MD;  Location: [facility] INVASIVE CV LAB;  Service: Cardiovascular;  Laterality: N/A;   CORONARY STENT INTERVENTION N/A 12/24/2021   Procedure: CORONARY STENT INTERVENTION;  Surgeon: Lawyer Bernardino Cough, MD;  Location: [facility] INVASIVE CV LAB;  Service: Cardiovascular;  Laterality: N/A;   ESOPHAGOGASTRODUODENOSCOPY N/A 12/02/2021   Procedure: ESOPHAGOGASTRODUODENOSCOPY (EGD);  Surgeon: Therisa Bi, MD;  Location: [facility] ENDOSCOPY;  Service: Gastroenterology;  Laterality: N/A;   LEFT HEART CATH AND CORONARY ANGIOGRAPHY Left 02/16/2017   Procedure: LEFT HEART CATH AND CORONARY ANGIOGRAPHY;  Surgeon: Hester Wolm PARAS, MD;  Location: [facility] INVASIVE CV LAB;  Service: Cardiovascular;  Laterality: Left;   LEFT HEART CATH AND CORONARY ANGIOGRAPHY Left 12/24/2021   Procedure: LEFT HEART CATH AND CORONARY ANGIOGRAPHY;  Surgeon: Hester Wolm PARAS, MD;  Location: [facility] INVASIVE CV LAB;  Service: Cardiovascular;  Laterality: Left;   LEFT HEART CATH AND CORONARY ANGIOGRAPHY Left 06/17/2022   Procedure: LEFT HEART CATH AND CORONARY ANGIOGRAPHY;  Surgeon: Florencio Cara BIRCH, MD;  Location: [facility] INVASIVE CV LAB;  Service: Cardiovascular;  Laterality: Left;   LEFT HEART CATH AND CORS/GRAFTS ANGIOGRAPHY Left 11/23/2017   Procedure: LEFT HEART CATH AND CORS/GRAFTS ANGIOGRAPHY;  Surgeon: Hester Wolm PARAS, MD;  Location: [facility] INVASIVE CV LAB;  Service: Cardiovascular;  Laterality: Left;   LEFT HEART CATH AND CORS/GRAFTS ANGIOGRAPHY N/A 02/13/2019   Procedure: LEFT HEART CATH AND CORS/GRAFTS ANGIOGRAPHY;  Surgeon: Hester Wolm PARAS, MD;  Location: [facility] INVASIVE CV LAB;  Service: Cardiovascular;  Laterality: N/A;   LEFT HEART CATH AND CORS/GRAFTS ANGIOGRAPHY N/A 07/03/2019   Procedure:  LEFT HEART CATH AND CORS/GRAFTS ANGIOGRAPHY;  Surgeon: Hester Wolm PARAS, MD;  Location: [facility] INVASIVE CV LAB;  Service: Cardiovascular;  Laterality: N/A;   LEFT HEART CATH AND CORS/GRAFTS ANGIOGRAPHY N/A 11/04/2020   Procedure: LEFT HEART CATH AND CORS/GRAFTS ANGIOGRAPHY;  Surgeon: Hester Wolm PARAS, MD;  Location: [facility] INVASIVE CV LAB;  Service: Cardiovascular;  Laterality: N/A;    Medications Prior to Admission  Medication Sig Dispense Refill Last Dose/Taking   aspirin  EC 81 MG tablet Take 81 mg by mouth daily.   Past Week   atorvastatin  (LIPITOR ) 40 MG tablet Take 1 tablet (40 mg total) by mouth daily. 90 tablet 3 Past Week   Budeson-Glycopyrrol-Formoterol  (BREZTRI  AEROSPHERE) 160-9-4.8 MCG/ACT AERO Inhale 2 puffs into the lungs 2 (two) times daily. 10.7 g 11 Past Week   empagliflozin  (JARDIANCE ) 25 MG TABS tablet Take one tab a day for diabetes 90 tablet 3 Past Week   escitalopram  (LEXAPRO ) 10 MG tablet Take 1 tablet (10 mg total) by mouth daily. 30 tablet 2 Past Week   famotidine  (PEPCID ) 20 MG tablet Take 1 tablet (20 mg total) by mouth 2 (two) times daily. 90 tablet 1 Past Week   furosemide  (LASIX ) 40 MG tablet Take 1 tablet (40 mg total)  by mouth 2 (two) times daily. 180 tablet 1 Past Week   hydrALAZINE  (APRESOLINE ) 10 MG tablet Take 1 tablet (10 mg total) by mouth 3 (three) times daily. 90 tablet 2 Past Week   losartan  (COZAAR ) 100 MG tablet Take 1 tablet (100 mg total) by mouth daily. 90 tablet 3 Past Week   methocarbamol  (ROBAXIN ) 500 MG tablet Take 500 mg by mouth 3 (three) times daily as needed for muscle spasms.   Past Week   montelukast  (SINGULAIR ) 10 MG tablet Take 1 tablet (10 mg total) by mouth at bedtime. 90 tablet 1 Past Week   nitroGLYCERIN  (NITRODUR - DOSED IN MG/24 HR) 0.1 mg/hr patch Place 0.1 mg onto the skin daily.   Past Week   nitroGLYCERIN  (NITROSTAT ) 0.4 MG SL tablet DISSOLVE ONE TABLET UNDER THE TONGUE EVERY 5 MINUTES AS NEEDED FOR CHEST PAIN.  DO NOT EXCEED A TOTAL OF  3 DOSES IN 15 MINUTES 30 tablet 3 Past Week   oxybutynin  (DITROPAN ) 5 MG tablet Take 1 tablet (5 mg total) by mouth daily. 90 tablet 1 Past Week   PROAIR  RESPICLICK 108 (90 Base) MCG/ACT AEPB Inhale 2 puffs into the lungs every 6 (six) hours as needed. 3 each 1 Unknown   tirzepatide  (MOUNJARO ) 2.5 MG/0.5ML Pen Inject 2.5 mg into the skin once a week. 2 mL 2 02/03/2024   traMADol  (ULTRAM ) 50 MG tablet Take 50 mg by mouth every 6 (six) hours as needed. for pain   Past Week   zolpidem  (AMBIEN ) 5 MG tablet Take 1 tablet (5 mg total) by mouth at bedtime as needed. for sleep 30 tablet 2 Past Week   Blood Glucose Monitoring Suppl (ONETOUCH VERIO FLEX SYSTEM) w/Device KIT Use as directed twice a daily DX E11.65 1 kit 0    glucose blood (ONETOUCH VERIO) test strip Use as instructed twice daily DX E11.65 100 each 3    Misc. Devices (ADJUST BATH/SHOWER SEAT) MISC 1 shower seat for use at home to reduce risk of falling. 1 each 0    OneTouch Delica Lancets 30G MISC USE 1  TO CHECK GLUCOSE TWICE DAILY AS DIRECTED 100 each 0    Social History   Socioeconomic History   Marital status: Widowed    Spouse name: lynwood   Number of children: Not on file   Years of education: Not on file   Highest education level: Not on file  Occupational History   Not on file  Tobacco Use   Smoking status: Never   Smokeless tobacco: Never  Vaping Use   Vaping status: Never Used  Substance and Sexual Activity   Alcohol use: No    Alcohol/week: 0.0 standard drinks of alcohol   Drug use: No   Sexual activity: Yes  Other Topics Concern   Not on file  Social History Narrative   Not on file   Social Drivers of Health   Financial Resource Strain: Low Risk  (01/27/2024)   Received from [facility]   Overall Financial Resource Strain (CARDIA)    Difficulty of Paying Living Expenses: Not very hard  Food Insecurity: No Food Insecurity (02/10/2024)   Hunger Vital Sign    Worried About Running Out of Food in  the Last Year: Never true    Ran Out of Food in the Last Year: Never true  Recent Concern: Food Insecurity - Food Insecurity Present (01/27/2024)   Received from [facility]   Hunger Vital Sign    Within the  past 12 months, you worried that your food would run out before you got the money to buy more.: Sometimes true    Within the past 12 months, the food you bought just didn't last and you didn't have money to get more.: Sometimes true  Transportation Needs: No Transportation Needs (02/10/2024)   PRAPARE - Administrator, Civil Service (Medical): No    Lack of Transportation (Non-Medical): No  Physical Activity: Not on file  Stress: Not on file  Social Connections: Socially Integrated (02/10/2024)   Social Connection and Isolation Panel    Frequency of Communication with Friends and Family: Three times a week    Frequency of Social Gatherings with Friends and Family: Three times a week    Attends Religious Services: More than 4 times per year    Active Member of Clubs or Organizations: No    Attends Banker Meetings: 1 to 4 times per year    Marital Status: Married  Catering manager Violence: Not At Risk (02/10/2024)   Humiliation, Afraid, Rape, and Kick questionnaire    Fear of Current or Ex-Partner: No    Emotionally Abused: No    Physically Abused: No    Sexually Abused: No    Family History  Problem Relation Age of Onset   Diabetes Mother    Diabetes Father    Cancer Father    Diabetes Brother      Vitals:   02/12/24 2003 02/12/24 2345 02/13/24 0415 02/13/24 0717  BP: 108/67 124/86 128/81 101/65  Pulse: 60 (!) 59 66 (!) 59  Resp: 20 19 19 19   Temp: (!) 97.4 F (36.3 C) 98.2 F (36.8 C) 98 F (36.7 C) 97.9 F (36.6 C)  TempSrc:      SpO2: 95% 98% 97% 95%  Weight:      Height:        PHYSICAL EXAM General: Chronically ill-appearing elderly female, well nourished, in no acute distress. HEENT: Normocephalic and  atraumatic. Neck: No JVD.   Lungs: Normal respiratory effort on room air. Clear bilaterally to auscultation. No wheezes, crackles, rhonchi.  Heart: HRRR. Normal S1 and S2 without gallops or murmurs.  Abdomen: Non-distended appearing.  Msk: Normal strength and tone for age. Extremities: Warm and well perfused. No clubbing, cyanosis, edema.  Neuro: Alert and oriented X 3. Psych: Answers questions appropriately.   Labs: Basic Metabolic Panel: Recent Labs    02/11/24 0402 02/12/24 0854  NA 136 135  K 4.1 4.5  CL 101 98  CO2 26 23  GLUCOSE 171* 122*  BUN 23 18  CREATININE 1.28* 0.81  CALCIUM  9.1 9.4   Liver Function Tests: No results for input(s): AST, ALT, ALKPHOS, BILITOT, PROT, ALBUMIN in the last 72 hours.  No results for input(s): LIPASE, AMYLASE in the last 72 hours. CBC: Recent Labs    02/11/24 0402 02/12/24 0854  WBC 8.1 11.5*  HGB 11.4* 12.0  HCT 35.9* 36.6  MCV 82.5 80.3  PLT 218 268   Cardiac Enzymes: Recent Labs    02/10/24 1028  TROPONINIHS 169*   BNP: No results for input(s): BNP in the last 72 hours.  D-Dimer: No results for input(s): DDIMER in the last 72 hours. Hemoglobin A1C: No results for input(s): HGBA1C in the last 72 hours. Fasting Lipid Panel: No results for input(s): CHOL, HDL, LDLCALC, TRIG, CHOLHDL, LDLDIRECT in the last 72 hours.  Thyroid  Function Tests: No results for input(s): TSH, T4TOTAL, T3FREE, THYROIDAB in the last 72 hours.  Invalid input(s): FREET3 Anemia Panel: No results for input(s): VITAMINB12, FOLATE, FERRITIN, TIBC, IRON, RETICCTPCT in the last 72 hours.   Radiology: ECHOCARDIOGRAM COMPLETE Result Date: 02/11/2024    ECHOCARDIOGRAM REPORT   Patient Name:   Felicia Woods Date of Exam: 02/10/2024 Medical Rec #:  [id number]      Height:       67.0 in Accession #:    [id number]     Weight:       223.0 lb Date of Birth:  [birth year]     BSA:          2.118 m Patient  Age:    67 years       BP:           102/84 mmHg Patient Gender: F              HR:           61 bpm. Exam Location:  [facility] Procedure: 2D Echo, Cardiac Doppler and Color Doppler (Both Spectral and Color            Flow Doppler were utilized during procedure). Indications:     Chest Pain R07.9  History:         Patient has prior history of Echocardiogram examinations, most                  recent 04/07/2022. CHF, Previous Myocardial Infarction, Acute                  MI, CAD and Angina, Prior CABG, Signs/Symptoms:Shortness of                  Breath; Risk Factors:Diabetes and Dyslipidemia.  Sonographer:     Thea Norlander RCS Referring Phys:  [id number] DORENE COMFORT Diagnosing Phys: Cara JONETTA Lovelace MD IMPRESSIONS  1. Left ventricular ejection fraction, by estimation, is 55 to 60%. The left ventricle has normal function. The left ventricle has no regional wall motion abnormalities. Left ventricular diastolic parameters were normal.  2. Right ventricular systolic function is normal. The right ventricular size is normal.  3. The mitral valve is normal in structure. Trivial mitral valve regurgitation.  4. The aortic valve is normal in structure. Aortic valve regurgitation is not visualized. FINDINGS  Left Ventricle: Left ventricular ejection fraction, by estimation, is 55 to 60%. The left ventricle has normal function. The left ventricle has no regional wall motion abnormalities. Strain was performed and the global longitudinal strain is indeterminate. The left ventricular internal cavity size was normal in size. There is no left ventricular hypertrophy. Left ventricular diastolic parameters were normal. Right Ventricle: The right ventricular size is normal. No increase in right ventricular wall thickness. Right ventricular systolic function is normal. Left Atrium: Left atrial size was normal in size. Right Atrium: Right atrial size was normal in size. Pericardium: There is no evidence of pericardial effusion. Mitral  Valve: The mitral valve is normal in structure. Trivial mitral valve regurgitation. Tricuspid Valve: The tricuspid valve is normal in structure. Tricuspid valve regurgitation is not demonstrated. Aortic Valve: The aortic valve is normal in structure. Aortic valve regurgitation is not visualized. Aortic valve peak gradient measures 4.2 mmHg. Pulmonic Valve: The pulmonic valve was normal in structure. Pulmonic valve regurgitation is not visualized. Aorta: The ascending aorta was not well visualized. IAS/Shunts: No atrial level shunt detected by color flow Doppler. Additional Comments: 3D was performed not requiring image post processing on an independent workstation and was indeterminate.  LEFT VENTRICLE PLAX  2D LVIDd:         3.80 cm   Diastology LVIDs:         2.70 cm   LV e' medial:    4.24 cm/s LV PW:         1.05 cm   LV E/e' medial:  15.4 LV IVS:        0.80 cm   LV e' lateral:   7.72 cm/s LVOT diam:     1.90 cm   LV E/e' lateral: 8.5 LV SV:         37 LV SV Index:   17 LVOT Area:     2.84 cm  RIGHT VENTRICLE            IVC RV S prime:     4.20 cm/s  IVC diam: 1.60 cm LEFT ATRIUM         Index LA diam:    3.55 cm 1.68 cm/m  AORTIC VALVE AV Area (Vmax): 2.20 cm AV Vmax:        102.00 cm/s AV Peak Grad:   4.2 mmHg LVOT Vmax:      79.10 cm/s LVOT Vmean:     49.800 cm/s LVOT VTI:       0.129 m  AORTA Ao Root diam: 2.90 cm Ao Asc diam:  3.00 cm MITRAL VALVE MV Area (PHT): 3.99 cm    SHUNTS MV Decel Time: 190 msec    Systemic VTI:  0.13 m MV E velocity: 65.50 cm/s  Systemic Diam: 1.90 cm MV A velocity: 56.60 cm/s MV E/A ratio:  1.16 Dwayne JONETTA Lovelace MD Electronically signed by Cara JONETTA Lovelace MD Signature Date/Time: 02/11/2024/11:13:05 AM    Final    DG Chest Port 1 View Result Date: 02/09/2024 CLINICAL DATA:  Chest pain EXAM: PORTABLE CHEST 1 VIEW COMPARISON:  04/06/2022 FINDINGS: Stable cardiomediastinal silhouette. Aortic atherosclerotic calcification. Sternotomy. Bibasilar airspace opacities favor  atelectasis. No pleural effusion or pneumothorax. IMPRESSION: Bibasilar airspace opacities favor atelectasis. Electronically Signed   By: Norman Gatlin M.D.   On: 02/09/2024 22:14    ECHO as above.  TELEMETRY reviewed by me 02/13/2024: sinsu rhythm, rate 70s  EKG reviewed by me: sinus rhythm, RBBB rate 76 bpm, without acute ischemic changes, similar to prior EKGs  Data reviewed by me 02/13/2024: last 24h vitals tele labs imaging I/O hospitalist progress note  Principal Problem:   NSTEMI (non-ST elevated myocardial infarction) (HCC) Active Problems:   Essential hypertension   Depression   Dyslipidemia   GERD without esophagitis   Asthma, chronic    ASSESSMENT AND PLAN:  Ruqayya Ventress is a 68 y.o. female  with a past medical history of coronary artery disease s/p CABG, multiple stents, chronic angina, hypertension, hyperlipidemia, type 2 diabetes mellitus, chronic HFpEF, obesity, asthma, COPD who presented to the ED on 02/09/2024 for chest pain. Cardiology was consulted for further evaluation.  # Chronic Angina, mildly and flat elevated troponin # Coronary artery disease s/p CABG x 4, multiple stents # Hypertension # Hyperlipidemia  Patient presents with chest pain. Patient not taken her metoprolol , Imdur  or Ranexa  for at least the past week.  Discussed plan for either medical management with resuming her antianginals or proceeding with LHC.  Patient states she does not want to have another left heart cath since she has had many before and prefers to manage anginal symptoms with medication.  She states her chest pain has improved since yesterday. EKG in ED with sinus rhythm, RBBB rate 76 bpm,  without acute ischemic changes, similar to prior EKGs.  Troponins mildly elevated and flat 178 > 192 > 164.  Lipid panel with elevated cholesterol at 203, elevated LDL 147.  Echo this admission with EF 55-60%, no WMAs, normal diastolic function, trivial MR.  -Continue aspirin  81 mg daily.  -Continue  atorvastatin  to 80 mg daily. Recommend goal LDL < 50. -Continue losartan  50 mg daily.  -Continue amlodipine  5 mg daily.  -Continue Imdur  120 mg twice daily, Ranexa  1000 mg twice daily, metoprolol  succinate 100 mg daily. Uptitration of metoprolol  limited due to bradycardia.  -With continued chest discomfort patient requests a LHC. Plan for LHC with Dr. Paraschos on 08/12 at 12:30 PM.  -Discussed the risks and benefits of proceeding with LHC for further evaluation with the patient. She is agreeable to proceed.  NPO at midnight (Procedure on 08/12 at 12:30 PM ) with Dr. Ammon.  Written consent will be obtained.  Further recommendations following LHC.    # Chronic HFpEF Without SOB or lower extremity swelling.  BNP within normal limits.  Chest x-ray negative.  Appears euvolemic on exam. -Hold home lasix  to due bump in creatinine.  -Continue home dapagliflozin  25 mg daily. -Losartan , metoprolol  as stated above.   This patient's plan of care was discussed and created with Dr. Ammon and he is in agreement.  Signed: Niclas Markell, PA-C  02/13/2024, 10:14 AM [facility] Cardiology

## 2024-02-13 NOTE — Plan of Care (Signed)

## 2024-02-13 NOTE — Progress Notes (Signed)
 [facility]  - [place] [facility]   PATIENT NAME: Felicia Woods    MR#:  [id number]  DATE OF BIRTH:  10/08/1955  SUBJECTIVE:  patient seen earlier. Patient continues to have on and off chest pain. She is requesting heart catheterization be done. Cardiology aware  VITALS:  Blood pressure 104/64, pulse 62, temperature 97.9 F (36.6 C), resp. rate 18, height 5' 7 (1.702 m), weight 101.2 kg, SpO2 98%.  PHYSICAL EXAMINATION:   GENERAL:  68 y.o.-year-old patient with no acute distress. obese LUNGS: Normal breath sounds bilaterally, no wheezing CARDIOVASCULAR: S1, S2 normal. No murmur  pain on palpation ABDOMEN: Soft, nontender, nondistended.  EXTREMITIES: No  edema b/l.    NEUROLOGIC: nonfocal  patient is alert and awake   LABORATORY PANEL:  CBC Recent Labs  Lab 02/12/24 0854  WBC 11.5*  HGB 12.0  HCT 36.6  PLT 268    Chemistries  Recent Labs  Lab 02/09/24 2201 02/11/24 0402 02/12/24 0854  NA 136   < > 135  K 3.5   < > 4.5  CL 103   < > 98  CO2 24   < > 23  GLUCOSE 144*   < > 122*  BUN 19   < > 18  CREATININE 0.70   < > 0.81  CALCIUM  9.0   < > 9.4  AST 22  --   --   ALT 15  --   --   ALKPHOS 60  --   --   BILITOT 0.6  --   --    < > = values in this interval not displayed.   Cardiac Enzymes No results for input(s): TROPONINI in the last 168 hours. RADIOLOGY:  No results found.   Assessment and Plan Felicia Woods is a 68 y.o. female with medical history significant for coronary artery disease status post PCIs and 7 stents, type 2 diabetes mellitus, dyslipidemia, hypertension, asthma and migraine, who presented to the emergency room with acute onset of left upper chest pain with radiation to her left shoulder and associated numbness in the left shoulder and arm.   NSTEMI (non-ST elevated myocardial infarction) (HCC) Chronic Stable Angina - continue her on IV heparin  for 48 hours--completed -- on aspirin  and Plavix  and as needed  sublingual nitroglycerin  and morphine  sulfate for pain. - resumed Ranexa  and Imdur  (pt was not taking) -  Toprol -XL, Lipitor . --decreased losartan  to 50 mg (creat up) -- Cardiology consultation with [facility] clinic-- recommends conservative management for now. - troponins 178--192--164--169 --cont with some CP--asking for morphine  --d/w Cards PA--will keep her NPO in case ?LHC -- cardiology plans heart catheterization tomorrow Tuesday   Essential hypertension -  continue antihypertensive therapy.   Dyslipidemia -  continue statin therapy   Asthma, chronic - continue her inhalers.   GERD without esophagitis - on H2 blocker therapy.   Depression - continue Lexapro .       Procedures: Family communication : none today consults : [facility] cardiology CODE STATUS: full DVT Prophylaxis : lovenoxLevel of care: Progressive Status is: Inpatient Remains inpatient appropriate because:on going cp, cardiology plans for heart catheterization tomorrow    TOTAL TIME TAKING CARE OF THIS PATIENT: 35 minutes.  >50% time spent on counselling and coordination of care  Note: This dictation was prepared with Dragon dictation along with smaller phrase technology. Any transcriptional errors that result from this process are unintentional.  Leita Blanch M.D    [facility]   CC: Primary care physician; Kristina Maxwell  K, PA-C

## 2024-02-14 ENCOUNTER — Encounter
Admission: EM | Disposition: A | Discharge status: Home / Self Care | Payer: Self-pay | Source: Home / Self Care | Attending: Emergency Medicine

## 2024-02-14 DIAGNOSIS — I214 Non-ST elevation (NSTEMI) myocardial infarction: Secondary | ICD-10-CM | POA: Diagnosis not present

## 2024-02-14 HISTORY — PX: LEFT HEART CATH AND CORS/GRAFTS ANGIOGRAPHY: CATH118250

## 2024-02-14 LAB — BASIC METABOLIC PANEL WITH GFR
Anion gap: 11 (ref 5–15)
BUN: 33 mg/dL — ABNORMAL HIGH (ref 8–23)
CO2: 24 mmol/L (ref 22–32)
Calcium: 9.4 mg/dL (ref 8.9–10.3)
Chloride: 101 mmol/L (ref 98–111)
Creatinine, Ser: 1.15 mg/dL — ABNORMAL HIGH (ref 0.44–1.00)
GFR, Estimated: 53 mL/min — ABNORMAL LOW (ref >60)
Glucose, Bld: 113 mg/dL — ABNORMAL HIGH (ref 70–99)
Potassium: 4.3 mmol/L (ref 3.5–5.1)
Sodium: 136 mmol/L (ref 135–145)

## 2024-02-14 LAB — CBC
HCT: 36.7 % (ref 36.0–46.0)
Hemoglobin: 11.7 g/dL — ABNORMAL LOW (ref 12.0–15.0)
MCH: 26.1 pg (ref 26.0–34.0)
MCHC: 31.9 g/dL (ref 30.0–36.0)
MCV: 81.9 fL (ref 80.0–100.0)
Platelets: 240 K/uL (ref 150–400)
RBC: 4.48 MIL/uL (ref 3.87–5.11)
RDW: 16.7 % — ABNORMAL HIGH (ref 11.5–15.5)
WBC: 8.1 K/uL (ref 4.0–10.5)
nRBC: 0 % (ref 0.0–0.2)

## 2024-02-14 LAB — GLUCOSE, CAPILLARY
Glucose-Capillary: 112 mg/dL — ABNORMAL HIGH (ref 70–99)
Glucose-Capillary: 100 mg/dL — ABNORMAL HIGH (ref 70–99)

## 2024-02-14 SURGERY — LEFT HEART CATH AND CORS/GRAFTS ANGIOGRAPHY
Anesthesia: Moderate Sedation

## 2024-02-14 MED ORDER — LIDOCAINE HCL 1 % IJ SOLN
INTRAMUSCULAR | Status: AC
  Filled 2024-02-14: qty 20

## 2024-02-14 MED ORDER — FENTANYL CITRATE (PF) 100 MCG/2ML IJ SOLN
INTRAMUSCULAR | Status: AC
Start: 2024-02-14 — End: 2024-02-14
  Filled 2024-02-14: qty 2

## 2024-02-14 MED ORDER — IOHEXOL 300 MG/ML  SOLN
INTRAMUSCULAR | Status: DC | PRN
  Administered 2024-02-14 (×2): 101 mL

## 2024-02-14 MED ORDER — SODIUM CHLORIDE 0.9% FLUSH: 3.0000 mL | INTRAVENOUS | Status: DC | PRN

## 2024-02-14 MED ORDER — LOSARTAN POTASSIUM 100 MG PO TABS: 50.0000 mg | ORAL_TABLET | Freq: Every day | ORAL | 3 refills | Status: AC

## 2024-02-14 MED ORDER — FREE WATER: 500.0000 mL | Freq: Once | Status: DC

## 2024-02-14 MED ORDER — MIDAZOLAM HCL 2 MG/2ML IJ SOLN
INTRAMUSCULAR | Status: DC | PRN
  Administered 2024-02-14 (×4): 1 mg via INTRAVENOUS

## 2024-02-14 MED ORDER — HEPARIN (PORCINE) IN NACL 1000-0.9 UT/500ML-% IV SOLN
INTRAVENOUS | Status: AC
  Filled 2024-02-14: qty 1000

## 2024-02-14 MED ORDER — CLOPIDOGREL BISULFATE 75 MG PO TABS: 75.0000 mg | ORAL_TABLET | Freq: Every day | ORAL | 0 refills | Status: DC

## 2024-02-14 MED ORDER — NITROGLYCERIN 0.4 MG SL SUBL
SUBLINGUAL_TABLET | SUBLINGUAL | Status: DC | PRN
  Administered 2024-02-14 (×2): .4 mg via SUBLINGUAL

## 2024-02-14 MED ORDER — SODIUM CHLORIDE 0.9% FLUSH: 3.0000 mL | Freq: Two times a day (BID) | INTRAVENOUS | Status: DC

## 2024-02-14 MED ORDER — NITROGLYCERIN 0.4 MG SL SUBL
SUBLINGUAL_TABLET | SUBLINGUAL | Status: AC
  Filled 2024-02-14: qty 1

## 2024-02-14 MED ORDER — SODIUM CHLORIDE 0.9 % IV SOLN: 250.0000 mL | INTRAVENOUS | Status: DC | PRN

## 2024-02-14 MED ORDER — METOPROLOL SUCCINATE ER 100 MG PO TB24: 100.0000 mg | ORAL_TABLET | Freq: Every day | ORAL | 1 refills | Status: AC

## 2024-02-14 MED ORDER — ISOSORBIDE MONONITRATE ER 120 MG PO TB24: 120.0000 mg | ORAL_TABLET | Freq: Two times a day (BID) | ORAL | 1 refills | Status: AC

## 2024-02-14 MED ORDER — FUROSEMIDE 40 MG PO TABS: 40.0000 mg | ORAL_TABLET | Freq: Every day | ORAL | 1 refills | Status: DC

## 2024-02-14 MED ORDER — ATORVASTATIN CALCIUM 40 MG PO TABS: 80.0000 mg | ORAL_TABLET | Freq: Every day | ORAL | 3 refills | Status: AC

## 2024-02-14 MED ORDER — FENTANYL CITRATE (PF) 100 MCG/2ML IJ SOLN
INTRAMUSCULAR | Status: DC | PRN
  Administered 2024-02-14: 25 ug via INTRAVENOUS
  Administered 2024-02-14: 50 ug via INTRAVENOUS
  Administered 2024-02-14 (×2): 25 ug via INTRAVENOUS
  Administered 2024-02-14: 50 ug via INTRAVENOUS
  Administered 2024-02-14: 25 ug via INTRAVENOUS

## 2024-02-14 MED ORDER — RANOLAZINE ER 1000 MG PO TB12: 1000.0000 mg | ORAL_TABLET | Freq: Two times a day (BID) | ORAL | 2 refills | Status: AC

## 2024-02-14 MED ORDER — LIDOCAINE HCL (PF) 1 % IJ SOLN
INTRAMUSCULAR | Status: DC | PRN
  Administered 2024-02-14 (×2): 5 mL

## 2024-02-14 MED ORDER — HEPARIN (PORCINE) IN NACL 2000-0.9 UNIT/L-% IV SOLN
INTRAVENOUS | Status: DC | PRN
Start: 2024-02-14 — End: 2024-02-14
  Administered 2024-02-14 (×2): 1000 mL

## 2024-02-14 MED ORDER — MIDAZOLAM HCL 2 MG/2ML IJ SOLN
INTRAMUSCULAR | Status: AC
  Filled 2024-02-14: qty 2

## 2024-02-14 MED ORDER — HYDRALAZINE HCL 20 MG/ML IJ SOLN: 10.0000 mg | INTRAMUSCULAR | Status: DC | PRN

## 2024-02-14 SURGICAL SUPPLY — 10 items
CATH INFINITI 5 FR IM (CATHETERS) IMPLANT
CATH INFINITI 5FR MULTPACK ANG (CATHETERS) IMPLANT
DEVICE CLOSURE MYNXGRIP 5F (Vascular Products) IMPLANT
NDL PERC 18GX7CM (NEEDLE) IMPLANT
NEEDLE PERC 18GX7CM (NEEDLE) ×1 IMPLANT
PACK CARDIAC CATH (CUSTOM PROCEDURE TRAY) ×1 IMPLANT
SET ATX-X65L (MISCELLANEOUS) IMPLANT
SHEATH AVANTI 5FR X 11CM (SHEATH) IMPLANT
WIRE EMERALD 3MM-J .035X260CM (WIRE) IMPLANT
WIRE GUIDERIGHT .035X150 (WIRE) IMPLANT

## 2024-02-14 NOTE — Progress Notes (Addendum)
 [facility] CLINIC CARDIOLOGY PROGRESS NOTE       Patient ID: Felicia Woods MRN: [id number] DOB/AGE: [birth year] 68 y.o.  Admit date: 02/09/2024 Referring Physician Dr. Lawence Primary Physician McDonough, Tinnie POUR, PA-C Primary Cardiologist Dr. Florencio Reason for Consultation Chest pain  HPI: Felicia Woods is a 68 y.o. female  with a past medical history of coronary artery disease s/p CABGx4, multiple stents, chronic angina, hypertension, hyperlipidemia, type 2 diabetes mellitus, chronic HFpEF, obesity, asthma, COPD who presented to the ED on 02/09/2024 for chest pain. Cardiology was consulted for further evaluation.   Interval history: -Patient seen and examined this AM, resting in hospital bed in no acute distress. -Continues to report chest discomfort. -Patient denies SOB or palpitations. -BP and HR stable this AM  -Plan for LHC at 12:30 PM this afternoon with Dr. Ammon. Patient has remained NPO.   Pertinent Cardiac History (Most recent) LHC 06/2022 with Dr. Florencio   Mid LAD to [facility] LAD lesion is 90% stenosed.   Ramus-1 lesion is 90% stenosed.   Ramus-2 lesion is 100% stenosed.   Ost Cx to Prox Cx lesion is 90% stenosed.   Prox RCA lesion is 95% stenosed.   Dist RCA lesion is 100% stenosed.   Prox Graft to Mid Graft lesion is 10% stenosed.   Origin to Prox Graft lesion is 100% stenosed.   Origin to Prox Graft lesion is 10% stenosed.   Mid Graft lesion is 65% stenosed.   Origin lesion is 100% stenosed.   Prox Graft lesion is 95% stenosed.   Mid Graft lesion is 85% stenosed.   Origin to Prox Graft lesion is 65% stenosed.   Dist Graft lesion is 80% stenosed.   Non-stenotic Origin lesion was previously treated.   Non-stenotic Mid Graft lesion was previously treated.   Non-stenotic Dist Graft lesion was previously treated.   Non-stenotic Origin lesion was previously treated.   SVG.   There is mild left ventricular systolic dysfunction.   LV end diastolic pressure is  normal.   The left ventricular ejection fraction is 50-55% by visual estimate.  Conclusion: No significant change from previous cath Intervention was deferred because no indication Patient is to be treated medically for chronic stable angina Mynx deployed Recommend aggressive medical management    Review of systems complete and found to be negative unless listed above    Past Medical History:  Diagnosis Date   Asthma    Coronary artery disease    Diabetes mellitus without complication (HCC)    Heart attack (HCC)    Hyperlipidemia    Hypertension    MI (myocardial infarction) (HCC)    Migraine headache with aura    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    Past Surgical History:  Procedure Laterality Date   ABDOMINAL HYSTERECTOMY     CARDIAC CATHETERIZATION     CARDIAC CATHETERIZATION N/A 10/18/2015   Procedure: Left Heart Cath and Cors/Grafts Angiography;  Surgeon: Dorn JINNY Lesches, MD;  Location: [facility] INVASIVE CV LAB;  Service: Cardiovascular;  Laterality: N/A;   CARDIAC CATHETERIZATION N/A 10/18/2015   Procedure: Coronary Stent Intervention;  Surgeon: Dorn JINNY Lesches, MD;  Location: [facility] INVASIVE CV LAB;  Service: Cardiovascular;  Laterality: N/A;   CARDIAC SURGERY     CHOLECYSTECTOMY     COLONOSCOPY WITH PROPOFOL  N/A 12/02/2021   Procedure: COLONOSCOPY WITH PROPOFOL ;  Surgeon: Therisa Bi, MD;  Location: [facility] ENDOSCOPY;  Service: Gastroenterology;  Laterality: N/A;   CORONARY ANGIOPLASTY     CORONARY ARTERY  BYPASS GRAFT     4 vessels - 2010   CORONARY STENT INTERVENTION N/A 02/16/2017   Procedure: CORONARY STENT INTERVENTION;  Surgeon: Florencio Cara BIRCH, MD;  Location: [facility] INVASIVE CV LAB;  Service: Cardiovascular;  Laterality: N/A;   CORONARY STENT INTERVENTION N/A 02/13/2019   Procedure: CORONARY STENT INTERVENTION;  Surgeon: Mady Bruckner, MD;  Location: [facility] INVASIVE CV LAB;  Service: Cardiovascular;  Laterality: N/A;  SVG to RCA   CORONARY STENT INTERVENTION Left  03/08/2019   Procedure: CORONARY STENT INTERVENTION;  Surgeon: Florencio Cara BIRCH, MD;  Location: [facility] INVASIVE CV LAB;  Service: Cardiovascular;  Laterality: Left;   CORONARY STENT INTERVENTION N/A 11/04/2020   Procedure: CORONARY STENT INTERVENTION;  Surgeon: Mady Bruckner, MD;  Location: [facility] INVASIVE CV LAB;  Service: Cardiovascular;  Laterality: N/A;   CORONARY STENT INTERVENTION N/A 12/24/2021   Procedure: CORONARY STENT INTERVENTION;  Surgeon: Lawyer Bernardino Cough, MD;  Location: [facility] INVASIVE CV LAB;  Service: Cardiovascular;  Laterality: N/A;   ESOPHAGOGASTRODUODENOSCOPY N/A 12/02/2021   Procedure: ESOPHAGOGASTRODUODENOSCOPY (EGD);  Surgeon: Therisa Bi, MD;  Location: [facility] ENDOSCOPY;  Service: Gastroenterology;  Laterality: N/A;   LEFT HEART CATH AND CORONARY ANGIOGRAPHY Left 02/16/2017   Procedure: LEFT HEART CATH AND CORONARY ANGIOGRAPHY;  Surgeon: Hester Wolm PARAS, MD;  Location: [facility] INVASIVE CV LAB;  Service: Cardiovascular;  Laterality: Left;   LEFT HEART CATH AND CORONARY ANGIOGRAPHY Left 12/24/2021   Procedure: LEFT HEART CATH AND CORONARY ANGIOGRAPHY;  Surgeon: Hester Wolm PARAS, MD;  Location: [facility] INVASIVE CV LAB;  Service: Cardiovascular;  Laterality: Left;   LEFT HEART CATH AND CORONARY ANGIOGRAPHY Left 06/17/2022   Procedure: LEFT HEART CATH AND CORONARY ANGIOGRAPHY;  Surgeon: Florencio Cara BIRCH, MD;  Location: [facility] INVASIVE CV LAB;  Service: Cardiovascular;  Laterality: Left;   LEFT HEART CATH AND CORS/GRAFTS ANGIOGRAPHY Left 11/23/2017   Procedure: LEFT HEART CATH AND CORS/GRAFTS ANGIOGRAPHY;  Surgeon: Hester Wolm PARAS, MD;  Location: [facility] INVASIVE CV LAB;  Service: Cardiovascular;  Laterality: Left;   LEFT HEART CATH AND CORS/GRAFTS ANGIOGRAPHY N/A 02/13/2019   Procedure: LEFT HEART CATH AND CORS/GRAFTS ANGIOGRAPHY;  Surgeon: Hester Wolm PARAS, MD;  Location: [facility] INVASIVE CV LAB;  Service: Cardiovascular;  Laterality: N/A;   LEFT HEART CATH AND CORS/GRAFTS ANGIOGRAPHY N/A  07/03/2019   Procedure: LEFT HEART CATH AND CORS/GRAFTS ANGIOGRAPHY;  Surgeon: Hester Wolm PARAS, MD;  Location: [facility] INVASIVE CV LAB;  Service: Cardiovascular;  Laterality: N/A;   LEFT HEART CATH AND CORS/GRAFTS ANGIOGRAPHY N/A 11/04/2020   Procedure: LEFT HEART CATH AND CORS/GRAFTS ANGIOGRAPHY;  Surgeon: Hester Wolm PARAS, MD;  Location: [facility] INVASIVE CV LAB;  Service: Cardiovascular;  Laterality: N/A;    Medications Prior to Admission  Medication Sig Dispense Refill Last Dose/Taking   aspirin  EC 81 MG tablet Take 81 mg by mouth daily.   Past Week   atorvastatin  (LIPITOR ) 40 MG tablet Take 1 tablet (40 mg total) by mouth daily. 90 tablet 3 Past Week   Budeson-Glycopyrrol-Formoterol  (BREZTRI  AEROSPHERE) 160-9-4.8 MCG/ACT AERO Inhale 2 puffs into the lungs 2 (two) times daily. 10.7 g 11 Past Week   empagliflozin  (JARDIANCE ) 25 MG TABS tablet Take one tab a day for diabetes 90 tablet 3 Past Week   escitalopram  (LEXAPRO ) 10 MG tablet Take 1 tablet (10 mg total) by mouth daily. 30 tablet 2 Past Week   famotidine  (PEPCID ) 20 MG tablet Take 1 tablet (20 mg total) by mouth 2 (two) times daily. 90 tablet 1 Past Week   furosemide  (LASIX ) 40 MG tablet  Take 1 tablet (40 mg total) by mouth 2 (two) times daily. 180 tablet 1 Past Week   hydrALAZINE  (APRESOLINE ) 10 MG tablet Take 1 tablet (10 mg total) by mouth 3 (three) times daily. 90 tablet 2 Past Week   losartan  (COZAAR ) 100 MG tablet Take 1 tablet (100 mg total) by mouth daily. 90 tablet 3 Past Week   methocarbamol  (ROBAXIN ) 500 MG tablet Take 500 mg by mouth 3 (three) times daily as needed for muscle spasms.   Past Week   montelukast  (SINGULAIR ) 10 MG tablet Take 1 tablet (10 mg total) by mouth at bedtime. 90 tablet 1 Past Week   nitroGLYCERIN  (NITRODUR - DOSED IN MG/24 HR) 0.1 mg/hr patch Place 0.1 mg onto the skin daily.   Past Week   nitroGLYCERIN  (NITROSTAT ) 0.4 MG SL tablet DISSOLVE ONE TABLET UNDER THE TONGUE EVERY 5 MINUTES AS NEEDED FOR CHEST PAIN.   DO NOT EXCEED A TOTAL OF 3 DOSES IN 15 MINUTES 30 tablet 3 Past Week   oxybutynin  (DITROPAN ) 5 MG tablet Take 1 tablet (5 mg total) by mouth daily. 90 tablet 1 Past Week   PROAIR  RESPICLICK 108 (90 Base) MCG/ACT AEPB Inhale 2 puffs into the lungs every 6 (six) hours as needed. 3 each 1 Unknown   tirzepatide  (MOUNJARO ) 2.5 MG/0.5ML Pen Inject 2.5 mg into the skin once a week. 2 mL 2 02/03/2024   traMADol  (ULTRAM ) 50 MG tablet Take 50 mg by mouth every 6 (six) hours as needed. for pain   Past Week   zolpidem  (AMBIEN ) 5 MG tablet Take 1 tablet (5 mg total) by mouth at bedtime as needed. for sleep 30 tablet 2 Past Week   Blood Glucose Monitoring Suppl (ONETOUCH VERIO FLEX SYSTEM) w/Device KIT Use as directed twice a daily DX E11.65 1 kit 0    glucose blood (ONETOUCH VERIO) test strip Use as instructed twice daily DX E11.65 100 each 3    Misc. Devices (ADJUST BATH/SHOWER SEAT) MISC 1 shower seat for use at home to reduce risk of falling. 1 each 0    OneTouch Delica Lancets 30G MISC USE 1  TO CHECK GLUCOSE TWICE DAILY AS DIRECTED 100 each 0    Social History   Socioeconomic History   Marital status: Widowed    Spouse name: lynwood   Number of children: Not on file   Years of education: Not on file   Highest education level: Not on file  Occupational History   Not on file  Tobacco Use   Smoking status: Never   Smokeless tobacco: Never  Vaping Use   Vaping status: Never Used  Substance and Sexual Activity   Alcohol use: No    Alcohol/week: 0.0 standard drinks of alcohol   Drug use: No   Sexual activity: Yes  Other Topics Concern   Not on file  Social History Narrative   Not on file   Social Drivers of Health   Financial Resource Strain: Low Risk  (01/27/2024)   Received from [facility]   Overall Financial Resource Strain (CARDIA)    Difficulty of Paying Living Expenses: Not very hard  Food Insecurity: No Food Insecurity (02/10/2024)   Hunger Vital Sign    Worried About  Running Out of Food in the Last Year: Never true    Ran Out of Food in the Last Year: Never true  Recent Concern: Food Insecurity - Food Insecurity Present (01/27/2024)   Received from [facility]   Hunger Vital  Sign    Within the past 12 months, you worried that your food would run out before you got the money to buy more.: Sometimes true    Within the past 12 months, the food you bought just didn't last and you didn't have money to get more.: Sometimes true  Transportation Needs: No Transportation Needs (02/10/2024)   PRAPARE - Administrator, Civil Service (Medical): No    Lack of Transportation (Non-Medical): No  Physical Activity: Not on file  Stress: Not on file  Social Connections: Socially Integrated (02/10/2024)   Social Connection and Isolation Panel    Frequency of Communication with Friends and Family: Three times a week    Frequency of Social Gatherings with Friends and Family: Three times a week    Attends Religious Services: More than 4 times per year    Active Member of Clubs or Organizations: No    Attends Banker Meetings: 1 to 4 times per year    Marital Status: Married  Catering manager Violence: Not At Risk (02/10/2024)   Humiliation, Afraid, Rape, and Kick questionnaire    Fear of Current or Ex-Partner: No    Emotionally Abused: No    Physically Abused: No    Sexually Abused: No    Family History  Problem Relation Age of Onset   Diabetes Mother    Diabetes Father    Cancer Father    Diabetes Brother      Vitals:   02/14/24 0000 02/14/24 0347 02/14/24 0556 02/14/24 0834  BP: (!) 91/45 108/70  110/72  Pulse: 64 64  66  Resp: 18 18  17   Temp: 97.8 F (36.6 C) 98.1 F (36.7 C)  98 F (36.7 C)  TempSrc: Oral Oral  Oral  SpO2: 98% 96%  96%  Weight:   107.3 kg   Height:        PHYSICAL EXAM General: Chronically ill-appearing elderly female, well nourished, in no acute distress. HEENT: Normocephalic and  atraumatic. Neck: No JVD.   Lungs: Normal respiratory effort on room air. Clear bilaterally to auscultation. No wheezes, crackles, rhonchi.  Heart: HRRR. Normal S1 and S2 without gallops or murmurs.  Abdomen: Non-distended appearing.  Msk: Normal strength and tone for age. Extremities: Warm and well perfused. No clubbing, cyanosis, edema.  Neuro: Alert and oriented X 3. Psych: Answers questions appropriately.   Labs: Basic Metabolic Panel: Recent Labs    02/12/24 0854 02/14/24 0400  NA 135 136  K 4.5 4.3  CL 98 101  CO2 23 24  GLUCOSE 122* 113*  BUN 18 33*  CREATININE 0.81 1.15*  CALCIUM  9.4 9.4   Liver Function Tests: No results for input(s): AST, ALT, ALKPHOS, BILITOT, PROT, ALBUMIN in the last 72 hours.  No results for input(s): LIPASE, AMYLASE in the last 72 hours. CBC: Recent Labs    02/12/24 0854 02/14/24 0400  WBC 11.5* 8.1  HGB 12.0 11.7*  HCT 36.6 36.7  MCV 80.3 81.9  PLT 268 240   Cardiac Enzymes: No results for input(s): CKTOTAL, CKMB, CKMBINDEX, TROPONINIHS in the last 72 hours.  BNP: No results for input(s): BNP in the last 72 hours.  D-Dimer: No results for input(s): DDIMER in the last 72 hours. Hemoglobin A1C: No results for input(s): HGBA1C in the last 72 hours. Fasting Lipid Panel: No results for input(s): CHOL, HDL, LDLCALC, TRIG, CHOLHDL, LDLDIRECT in the last 72 hours.  Thyroid  Function Tests: No results for input(s): TSH, T4TOTAL, T3FREE, THYROIDAB in  the last 72 hours.  Invalid input(s): FREET3 Anemia Panel: No results for input(s): VITAMINB12, FOLATE, FERRITIN, TIBC, IRON, RETICCTPCT in the last 72 hours.   Radiology: ECHOCARDIOGRAM COMPLETE Result Date: 02/11/2024    ECHOCARDIOGRAM REPORT   Patient Name:   Felicia Woods Date of Exam: 02/10/2024 Medical Rec #:  [id number]      Height:       67.0 in Accession #:    [id number]     Weight:       223.0 lb Date of Birth:   January 18, 1956     BSA:          2.118 m Patient Age:    67 years       BP:           102/84 mmHg Patient Gender: F              HR:           61 bpm. Exam Location:  [facility] Procedure: 2D Echo, Cardiac Doppler and Color Doppler (Both Spectral and Color            Flow Doppler were utilized during procedure). Indications:     Chest Pain R07.9  History:         Patient has prior history of Echocardiogram examinations, most                  recent 04/07/2022. CHF, Previous Myocardial Infarction, Acute                  MI, CAD and Angina, Prior CABG, Signs/Symptoms:Shortness of                  Breath; Risk Factors:Diabetes and Dyslipidemia.  Sonographer:     Thea Norlander RCS Referring Phys:  [id number] DORENE COMFORT Diagnosing Phys: Cara JONETTA Lovelace MD IMPRESSIONS  1. Left ventricular ejection fraction, by estimation, is 55 to 60%. The left ventricle has normal function. The left ventricle has no regional wall motion abnormalities. Left ventricular diastolic parameters were normal.  2. Right ventricular systolic function is normal. The right ventricular size is normal.  3. The mitral valve is normal in structure. Trivial mitral valve regurgitation.  4. The aortic valve is normal in structure. Aortic valve regurgitation is not visualized. FINDINGS  Left Ventricle: Left ventricular ejection fraction, by estimation, is 55 to 60%. The left ventricle has normal function. The left ventricle has no regional wall motion abnormalities. Strain was performed and the global longitudinal strain is indeterminate. The left ventricular internal cavity size was normal in size. There is no left ventricular hypertrophy. Left ventricular diastolic parameters were normal. Right Ventricle: The right ventricular size is normal. No increase in right ventricular wall thickness. Right ventricular systolic function is normal. Left Atrium: Left atrial size was normal in size. Right Atrium: Right atrial size was normal in size. Pericardium: There  is no evidence of pericardial effusion. Mitral Valve: The mitral valve is normal in structure. Trivial mitral valve regurgitation. Tricuspid Valve: The tricuspid valve is normal in structure. Tricuspid valve regurgitation is not demonstrated. Aortic Valve: The aortic valve is normal in structure. Aortic valve regurgitation is not visualized. Aortic valve peak gradient measures 4.2 mmHg. Pulmonic Valve: The pulmonic valve was normal in structure. Pulmonic valve regurgitation is not visualized. Aorta: The ascending aorta was not well visualized. IAS/Shunts: No atrial level shunt detected by color flow Doppler. Additional Comments: 3D was performed not requiring image post processing on an independent workstation and was  indeterminate.  LEFT VENTRICLE PLAX 2D LVIDd:         3.80 cm   Diastology LVIDs:         2.70 cm   LV e' medial:    4.24 cm/s LV PW:         1.05 cm   LV E/e' medial:  15.4 LV IVS:        0.80 cm   LV e' lateral:   7.72 cm/s LVOT diam:     1.90 cm   LV E/e' lateral: 8.5 LV SV:         37 LV SV Index:   17 LVOT Area:     2.84 cm  RIGHT VENTRICLE            IVC RV S prime:     4.20 cm/s  IVC diam: 1.60 cm LEFT ATRIUM         Index LA diam:    3.55 cm 1.68 cm/m  AORTIC VALVE AV Area (Vmax): 2.20 cm AV Vmax:        102.00 cm/s AV Peak Grad:   4.2 mmHg LVOT Vmax:      79.10 cm/s LVOT Vmean:     49.800 cm/s LVOT VTI:       0.129 m  AORTA Ao Root diam: 2.90 cm Ao Asc diam:  3.00 cm MITRAL VALVE MV Area (PHT): 3.99 cm    SHUNTS MV Decel Time: 190 msec    Systemic VTI:  0.13 m MV E velocity: 65.50 cm/s  Systemic Diam: 1.90 cm MV A velocity: 56.60 cm/s MV E/A ratio:  1.16 Dwayne JONETTA Lovelace MD Electronically signed by Cara JONETTA Lovelace MD Signature Date/Time: 02/11/2024/11:13:05 AM    Final    DG Chest Port 1 View Result Date: 02/09/2024 CLINICAL DATA:  Chest pain EXAM: PORTABLE CHEST 1 VIEW COMPARISON:  04/06/2022 FINDINGS: Stable cardiomediastinal silhouette. Aortic atherosclerotic calcification.  Sternotomy. Bibasilar airspace opacities favor atelectasis. No pleural effusion or pneumothorax. IMPRESSION: Bibasilar airspace opacities favor atelectasis. Electronically Signed   By: Norman Gatlin M.D.   On: 02/09/2024 22:14    ECHO as above.  TELEMETRY reviewed by me 02/14/2024: sinsu rhythm, rate 70s  EKG reviewed by me: sinus rhythm, RBBB rate 76 bpm, without acute ischemic changes, similar to prior EKGs  Data reviewed by me 02/14/2024: last 24h vitals tele labs imaging I/O hospitalist progress note  Principal Problem:   NSTEMI (non-ST elevated myocardial infarction) (HCC) Active Problems:   Essential hypertension   Depression   Dyslipidemia   GERD without esophagitis   Asthma, chronic    ASSESSMENT AND PLAN:  Felicia Woods is a 68 y.o. female  with a past medical history of coronary artery disease s/p CABG, multiple stents, chronic angina, hypertension, hyperlipidemia, type 2 diabetes mellitus, chronic HFpEF, obesity, asthma, COPD who presented to the ED on 02/09/2024 for chest pain. Cardiology was consulted for further evaluation.  # Chronic Angina, mildly and flat elevated troponin # Coronary artery disease s/p CABG x 4, multiple stents # Hypertension # Hyperlipidemia  Patient presents with chest pain. Patient not taken her metoprolol , Imdur  or Ranexa  for at least the past week. Patient hasn't refilled her Plavix  since 07/2022.  Discussed plan for either medical management with resuming her antianginals or proceeding with LHC.  Patient states she does not want to have another left heart cath since she has had many before and prefers to manage anginal symptoms with medication (08/08).  EKG in ED with sinus rhythm,  RBBB rate 76 bpm, without acute ischemic changes, similar to prior EKGs.  Troponins mildly elevated and flat 178 > 192 > 164.  Lipid panel with elevated cholesterol at 203, elevated LDL 147.  Echo this admission with EF 55-60%, no WMAs, normal diastolic function, trivial  MR. On 08/11 patient states she still has CP and requests to do LHC. -Continue aspirin  81 mg, plavix  75 mg daily.  -Continue atorvastatin  to 80 mg daily. Recommend goal LDL < 50. -Continue losartan  50 mg daily.  -Continue amlodipine  5 mg daily.  -Continue Imdur  120 mg twice daily, Ranexa  1000 mg twice daily, metoprolol  succinate 100 mg daily. Uptitration of metoprolol  limited due to bradycardia.  -Plan for LHC with Dr. Ammon on 08/12 at 12:30 PM.  -Discussed the risks and benefits of proceeding with LHC for further evaluation with the patient. She is agreeable to proceed.  NPO at until [facility] (08/12 at 12:30 PM ) with Dr. Ammon.  Written consent will be obtained.  Further recommendations following LHC.    # Chronic HFpEF Without SOB or lower extremity swelling.  BNP within normal limits.  Chest x-ray negative.  Appears euvolemic on exam. -Hold home lasix  to due bump in creatinine.  -Continue home dapagliflozin  25 mg daily. -Losartan , metoprolol  as stated above.   This patient's plan of care was discussed and created with Dr. Ammon and he is in agreement.  Signed: Laster Appling, PA-C  02/14/2024, 10:00 AM [facility] : [facility] Cardiology

## 2024-02-14 NOTE — Progress Notes (Signed)
 Work note  To whomsoever it may concern  Ms Felicia Woods was admitted under my service from 02/09/2024 to 02/14/2024 She can return back 02/20/2024 to work.

## 2024-02-14 NOTE — Progress Notes (Signed)
 Dr. Ammon in at bedside, speaking with pt. And her spouse re: cath results. MD spoke extensively with pt. And spouse re: cath, graft status, meds. & drew on room display board.

## 2024-02-14 NOTE — Progress Notes (Signed)
 [facility]  - [place] [facility]   PATIENT NAME: Beryl Hornberger    MR#:  [id number]  DATE OF BIRTH:  06/24/1956  SUBJECTIVE:  patient seen earlier. Patient continues to have on and off chest pain.  NPO for Cath  VITALS:  Blood pressure 125/81, pulse 66, temperature 97.7 F (36.5 C), temperature source Tympanic, resp. rate 18, height 5' 7 (1.702 m), weight 107.3 kg, SpO2 98%.  PHYSICAL EXAMINATION:   GENERAL:  68 y.o.-year-old patient with no acute distress. obese LUNGS: Normal breath sounds bilaterally, no wheezing CARDIOVASCULAR: S1, S2 normal. No murmur  pain on palpation ABDOMEN: Soft, nontender, nondistended.  EXTREMITIES: No  edema b/l.    NEUROLOGIC: nonfocal  patient is alert and awake   LABORATORY PANEL:  CBC Recent Labs  Lab 02/14/24 0400  WBC 8.1  HGB 11.7*  HCT 36.7  PLT 240    Chemistries  Recent Labs  Lab 02/09/24 2201 02/11/24 0402 02/14/24 0400  NA 136   < > 136  K 3.5   < > 4.3  CL 103   < > 101  CO2 24   < > 24  GLUCOSE 144*   < > 113*  BUN 19   < > 33*  CREATININE 0.70   < > 1.15*  CALCIUM  9.0   < > 9.4  AST 22  --   --   ALT 15  --   --   ALKPHOS 60  --   --   BILITOT 0.6  --   --    < > = values in this interval not displayed.   Cardiac Enzymes No results for input(s): TROPONINI in the last 168 hours. RADIOLOGY:  No results found.   Assessment and Plan Jola Critzer is a 68 y.o. female with medical history significant for coronary artery disease status post PCIs and 7 stents, type 2 diabetes mellitus, dyslipidemia, hypertension, asthma and migraine, who presented to the emergency room with acute onset of left upper chest pain with radiation to her left shoulder and associated numbness in the left shoulder and arm.   NSTEMI (non-ST elevated myocardial infarction) (HCC) Chronic Stable Angina - continue her on IV heparin  for 48 hours--completed -- on aspirin  and Plavix  and as needed sublingual nitroglycerin   and morphine  sulfate for pain. - resumed Ranexa  and Imdur  (pt was not taking) -  Toprol -XL, Lipitor . --decreased losartan  to 50 mg (creat up) -- Cardiology consultation with [facility] clinic-- recommends conservative management for now. - troponins 178--192--164--169 --cont with some CP--asking for morphine  -- [facility] cardiology plans heart catheterization today   Essential hypertension -  continue antihypertensive therapy.   Dyslipidemia -  continue statin therapy   Asthma, chronic - continue her inhalers.   GERD without esophagitis - on H2 blocker therapy.   Depression - continue Lexapro .       Procedures: Family communication : none today consults : [facility] cardiology CODE STATUS: full DVT Prophylaxis : lovenoxLevel of care: Progressive Status is: Inpatient Remains inpatient appropriate because:on going cp, cardiology plans for heart catheterization tomorrow    TOTAL TIME TAKING CARE OF THIS PATIENT: 35 minutes.  >50% time spent on counselling and coordination of care  Note: This dictation was prepared with Dragon dictation along with smaller phrase technology. Any transcriptional errors that result from this process are unintentional.  Leita Blanch M.D    [facility]   CC: Primary care physician; Kristina Tinnie POUR, PA-C

## 2024-02-15 ENCOUNTER — Observation Stay
Admission: EM | Admit: 2024-02-15 | Discharge: 2024-02-17 | Disposition: A | Discharge status: Home / Self Care | Attending: Internal Medicine | Admitting: Internal Medicine

## 2024-02-15 ENCOUNTER — Emergency Department

## 2024-02-15 ENCOUNTER — Other Ambulatory Visit: Payer: Self-pay

## 2024-02-15 DIAGNOSIS — I25118 Atherosclerotic heart disease of native coronary artery with other forms of angina pectoris: Secondary | ICD-10-CM | POA: Insufficient documentation

## 2024-02-15 DIAGNOSIS — J45909 Unspecified asthma, uncomplicated: Secondary | ICD-10-CM | POA: Insufficient documentation

## 2024-02-15 DIAGNOSIS — I110 Hypertensive heart disease with heart failure: Secondary | ICD-10-CM | POA: Insufficient documentation

## 2024-02-15 DIAGNOSIS — Z7982 Long term (current) use of aspirin: Secondary | ICD-10-CM | POA: Diagnosis not present

## 2024-02-15 DIAGNOSIS — I214 Non-ST elevation (NSTEMI) myocardial infarction: Principal | ICD-10-CM

## 2024-02-15 DIAGNOSIS — I5032 Chronic diastolic (congestive) heart failure: Secondary | ICD-10-CM | POA: Diagnosis not present

## 2024-02-15 DIAGNOSIS — K219 Gastro-esophageal reflux disease without esophagitis: Secondary | ICD-10-CM | POA: Diagnosis not present

## 2024-02-15 DIAGNOSIS — Z794 Long term (current) use of insulin: Secondary | ICD-10-CM | POA: Insufficient documentation

## 2024-02-15 DIAGNOSIS — I10 Essential (primary) hypertension: Secondary | ICD-10-CM | POA: Diagnosis present

## 2024-02-15 DIAGNOSIS — Z6837 Body mass index (BMI) 37.0-37.9, adult: Secondary | ICD-10-CM | POA: Diagnosis not present

## 2024-02-15 DIAGNOSIS — E785 Hyperlipidemia, unspecified: Secondary | ICD-10-CM | POA: Diagnosis not present

## 2024-02-15 DIAGNOSIS — R079 Chest pain, unspecified: Secondary | ICD-10-CM | POA: Diagnosis present

## 2024-02-15 DIAGNOSIS — I200 Unstable angina: Secondary | ICD-10-CM | POA: Diagnosis not present

## 2024-02-15 DIAGNOSIS — E119 Type 2 diabetes mellitus without complications: Secondary | ICD-10-CM | POA: Insufficient documentation

## 2024-02-15 DIAGNOSIS — I213 ST elevation (STEMI) myocardial infarction of unspecified site: Secondary | ICD-10-CM | POA: Diagnosis present

## 2024-02-15 DIAGNOSIS — Z951 Presence of aortocoronary bypass graft: Secondary | ICD-10-CM

## 2024-02-15 LAB — BASIC METABOLIC PANEL WITH GFR
Anion gap: 12 (ref 5–15)
BUN: 26 mg/dL — ABNORMAL HIGH (ref 8–23)
CO2: 21 mmol/L — ABNORMAL LOW (ref 22–32)
Calcium: 9.6 mg/dL (ref 8.9–10.3)
Chloride: 101 mmol/L (ref 98–111)
Creatinine, Ser: 0.92 mg/dL (ref 0.44–1.00)
GFR, Estimated: >60 mL/min (ref >60)
Glucose, Bld: 149 mg/dL — ABNORMAL HIGH (ref 70–99)
Potassium: 4.2 mmol/L (ref 3.5–5.1)
Sodium: 134 mmol/L — ABNORMAL LOW (ref 135–145)

## 2024-02-15 LAB — CBC
HCT: 39.4 % (ref 36.0–46.0)
Hemoglobin: 12.5 g/dL (ref 12.0–15.0)
MCH: 25.6 pg — ABNORMAL LOW (ref 26.0–34.0)
MCHC: 31.7 g/dL (ref 30.0–36.0)
MCV: 80.7 fL (ref 80.0–100.0)
Platelets: 283 K/uL (ref 150–400)
RBC: 4.88 MIL/uL (ref 3.87–5.11)
RDW: 16.4 % — ABNORMAL HIGH (ref 11.5–15.5)
WBC: 8.8 K/uL (ref 4.0–10.5)
nRBC: 0 % (ref 0.0–0.2)

## 2024-02-15 LAB — HEPARIN LEVEL (UNFRACTIONATED)
Heparin Unfractionated: 0.56 [IU]/mL (ref 0.30–0.70)
Heparin Unfractionated: 0.58 [IU]/mL (ref 0.30–0.70)

## 2024-02-15 LAB — TROPONIN I (HIGH SENSITIVITY)
Troponin I (High Sensitivity): 818 ng/L (ref <18)
Troponin I (High Sensitivity): 1181 ng/L (ref <18)

## 2024-02-15 LAB — CBG MONITORING, ED
Glucose-Capillary: 153 mg/dL — ABNORMAL HIGH (ref 70–99)
Glucose-Capillary: 105 mg/dL — ABNORMAL HIGH (ref 70–99)
Glucose-Capillary: 121 mg/dL — ABNORMAL HIGH (ref 70–99)

## 2024-02-15 MED ORDER — ISOSORBIDE MONONITRATE ER 60 MG PO TB24
120.0000 mg | ORAL_TABLET | Freq: Two times a day (BID) | ORAL | Status: DC
  Administered 2024-02-15 – 2024-02-17 (×5): 120 mg via ORAL
  Filled 2024-02-15 (×4): qty 2

## 2024-02-15 MED ORDER — POLYETHYLENE GLYCOL 3350 17 G PO PACK: 17.0000 g | PACK | Freq: Every day | ORAL | Status: DC | PRN

## 2024-02-15 MED ORDER — INSULIN ASPART 100 UNIT/ML IJ SOLN
0.0000 [IU] | Freq: Three times a day (TID) | INTRAMUSCULAR | Status: DC
  Administered 2024-02-15 – 2024-02-16 (×3): 2 [IU] via SUBCUTANEOUS
  Administered 2024-02-17: 1 [IU] via SUBCUTANEOUS
  Filled 2024-02-15: qty 1
  Filled 2024-02-15: qty 2
  Filled 2024-02-15: qty 1

## 2024-02-15 MED ORDER — MORPHINE SULFATE (PF) 2 MG/ML IV SOLN
2.0000 mg | INTRAVENOUS | Status: DC | PRN
  Administered 2024-02-15 – 2024-02-17 (×5): 2 mg via INTRAVENOUS
  Filled 2024-02-15 (×4): qty 1

## 2024-02-15 MED ORDER — METOPROLOL SUCCINATE ER 100 MG PO TB24
100.0000 mg | ORAL_TABLET | Freq: Every day | ORAL | Status: DC
Start: 2024-02-15 — End: 2024-02-17
  Administered 2024-02-15 – 2024-02-17 (×4): 100 mg via ORAL
  Filled 2024-02-15: qty 1
  Filled 2024-02-15 (×2): qty 2

## 2024-02-15 MED ORDER — NITROGLYCERIN IN D5W 200-5 MCG/ML-% IV SOLN
100.0000 ug/min | INTRAVENOUS | Status: DC
  Administered 2024-02-15: 250 ug/min via INTRAVENOUS
  Administered 2024-02-15: 5 ug/min via INTRAVENOUS
  Administered 2024-02-15 (×2): 250 ug/min via INTRAVENOUS
  Administered 2024-02-15: 5 ug/min via INTRAVENOUS
  Administered 2024-02-15 (×3): 250 ug/min via INTRAVENOUS
  Filled 2024-02-15 (×4): qty 250

## 2024-02-15 MED ORDER — ACETAMINOPHEN 325 MG PO TABS: 650.0000 mg | ORAL_TABLET | Freq: Four times a day (QID) | ORAL | Status: DC | PRN

## 2024-02-15 MED ORDER — EMPAGLIFLOZIN 25 MG PO TABS
25.0000 mg | ORAL_TABLET | Freq: Every day | ORAL | Status: DC
  Administered 2024-02-15 – 2024-02-17 (×4): 25 mg via ORAL
  Filled 2024-02-15 (×4): qty 1

## 2024-02-15 MED ORDER — INSULIN ASPART 100 UNIT/ML IJ SOLN: 0.0000 [IU] | Freq: Every day | INTRAMUSCULAR | Status: DC

## 2024-02-15 MED ORDER — HEPARIN BOLUS VIA INFUSION
4000.0000 [IU] | Freq: Once | INTRAVENOUS | Status: AC
  Administered 2024-02-15 (×2): 4000 [IU] via INTRAVENOUS
  Filled 2024-02-15: qty 4000

## 2024-02-15 MED ORDER — HEPARIN (PORCINE) 25000 UT/250ML-% IV SOLN
1350.0000 [IU]/h | INTRAVENOUS | Status: DC
  Administered 2024-02-15 – 2024-02-17 (×5): 1200 [IU]/h via INTRAVENOUS
  Filled 2024-02-15 (×3): qty 250

## 2024-02-15 MED ORDER — FAMOTIDINE 20 MG PO TABS
20.0000 mg | ORAL_TABLET | Freq: Two times a day (BID) | ORAL | Status: DC
  Administered 2024-02-15 – 2024-02-17 (×7): 20 mg via ORAL
  Filled 2024-02-15 (×5): qty 1

## 2024-02-15 MED ORDER — RANOLAZINE ER 500 MG PO TB12
1000.0000 mg | ORAL_TABLET | Freq: Two times a day (BID) | ORAL | Status: DC
  Administered 2024-02-15 – 2024-02-17 (×7): 1000 mg via ORAL
  Filled 2024-02-15 (×5): qty 2

## 2024-02-15 MED ORDER — CLOPIDOGREL BISULFATE 75 MG PO TABS
75.0000 mg | ORAL_TABLET | Freq: Every day | ORAL | Status: DC
  Administered 2024-02-15 – 2024-02-17 (×4): 75 mg via ORAL
  Filled 2024-02-15 (×3): qty 1

## 2024-02-15 MED ORDER — ATORVASTATIN CALCIUM 80 MG PO TABS
80.0000 mg | ORAL_TABLET | Freq: Every day | ORAL | Status: DC
  Administered 2024-02-15 – 2024-02-17 (×4): 80 mg via ORAL
  Filled 2024-02-15 (×2): qty 4
  Filled 2024-02-15: qty 1

## 2024-02-15 MED ORDER — LOSARTAN POTASSIUM 50 MG PO TABS
50.0000 mg | ORAL_TABLET | Freq: Every day | ORAL | Status: DC
  Administered 2024-02-15 – 2024-02-17 (×4): 50 mg via ORAL
  Filled 2024-02-15 (×3): qty 1

## 2024-02-15 MED ORDER — ASPIRIN 81 MG PO TBEC
81.0000 mg | DELAYED_RELEASE_TABLET | Freq: Every day | ORAL | Status: DC
  Administered 2024-02-15 – 2024-02-17 (×4): 81 mg via ORAL
  Filled 2024-02-15 (×3): qty 1

## 2024-02-15 MED ORDER — HYDRALAZINE HCL 10 MG PO TABS
10.0000 mg | ORAL_TABLET | Freq: Three times a day (TID) | ORAL | Status: DC
  Administered 2024-02-15 – 2024-02-17 (×6): 10 mg via ORAL
  Filled 2024-02-15 (×5): qty 1

## 2024-02-15 MED ORDER — FUROSEMIDE 40 MG PO TABS
40.0000 mg | ORAL_TABLET | Freq: Every day | ORAL | Status: DC
  Administered 2024-02-15 – 2024-02-17 (×4): 40 mg via ORAL
  Filled 2024-02-15 (×3): qty 1

## 2024-02-15 MED ORDER — ACETAMINOPHEN 650 MG RE SUPP: 650.0000 mg | Freq: Four times a day (QID) | RECTAL | Status: DC | PRN

## 2024-02-15 MED ORDER — HEPARIN SODIUM (PORCINE) 5000 UNIT/ML IJ SOLN
4000.0000 [IU] | Freq: Once | INTRAMUSCULAR | Status: DC
  Filled 2024-02-15: qty 1

## 2024-02-15 NOTE — H&P (Signed)
 History and Physical    Felicia Woods FMW:[id number] DOB: [birth year] DOA: 02/15/2024  DOS: the patient was seen and examined on 02/15/2024  PCP: Kristina Tinnie POUR, PA-C   Patient coming from: Home  I have personally briefly reviewed patient's old medical records in [facility]  Chief Complaint: Chest pain  HPI: Felicia Woods is a pleasant 68 y.o. female with medical history significant for CAD s/p CABG x 4 with multiple stents, COPD, obesity, diastolic dysfunction with normal ejection fraction who was admitted to [facility] for chest pain and was discharged on 02/14/2024 after being treated for ACS, s/p left heart catheterization and advised for medical management.  Patient started having chest pain when she reached home, which was severe 8-9/10, retrosternal, nonradiating, not associated with nausea or vomiting.  She denied any fever or chills.  Denied any chest trauma.  ED Course: Upon arrival to the ED, patient is found to have severe chest pain with elevated troponin at 818 and 1181.  She was started on heparin  drip and hospitalist service was consulted for evaluation for admission.  Review of Systems:  ROS  All other systems negative except as noted in the HPI.  Past Medical History:  Diagnosis Date   Asthma    Coronary artery disease    Diabetes mellitus without complication (HCC)    Heart attack (HCC)    Hyperlipidemia    Hypertension    MI (myocardial infarction) (HCC)    Migraine headache with aura    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    Past Surgical History:  Procedure Laterality Date   ABDOMINAL HYSTERECTOMY     CARDIAC CATHETERIZATION     CARDIAC CATHETERIZATION N/A 10/18/2015   Procedure: Left Heart Cath and Cors/Grafts Angiography;  Surgeon: Dorn JINNY Lesches, MD;  Location: [facility] INVASIVE CV LAB;  Service: Cardiovascular;  Laterality: N/A;   CARDIAC CATHETERIZATION N/A 10/18/2015   Procedure: Coronary Stent Intervention;  Surgeon:  Dorn JINNY Lesches, MD;  Location: [facility] INVASIVE CV LAB;  Service: Cardiovascular;  Laterality: N/A;   CARDIAC SURGERY     CHOLECYSTECTOMY     COLONOSCOPY WITH PROPOFOL  N/A 12/02/2021   Procedure: COLONOSCOPY WITH PROPOFOL ;  Surgeon: Therisa Bi, MD;  Location: [facility] ENDOSCOPY;  Service: Gastroenterology;  Laterality: N/A;   CORONARY ANGIOPLASTY     CORONARY ARTERY BYPASS GRAFT     4 vessels - 2010   CORONARY STENT INTERVENTION N/A 02/16/2017   Procedure: CORONARY STENT INTERVENTION;  Surgeon: Florencio Cara BIRCH, MD;  Location: [facility] INVASIVE CV LAB;  Service: Cardiovascular;  Laterality: N/A;   CORONARY STENT INTERVENTION N/A 02/13/2019   Procedure: CORONARY STENT INTERVENTION;  Surgeon: Mady Bruckner, MD;  Location: [facility] INVASIVE CV LAB;  Service: Cardiovascular;  Laterality: N/A;  SVG to RCA   CORONARY STENT INTERVENTION Left 03/08/2019   Procedure: CORONARY STENT INTERVENTION;  Surgeon: Florencio Cara BIRCH, MD;  Location: [facility] INVASIVE CV LAB;  Service: Cardiovascular;  Laterality: Left;   CORONARY STENT INTERVENTION N/A 11/04/2020   Procedure: CORONARY STENT INTERVENTION;  Surgeon: Mady Bruckner, MD;  Location: [facility] INVASIVE CV LAB;  Service: Cardiovascular;  Laterality: N/A;   CORONARY STENT INTERVENTION N/A 12/24/2021   Procedure: CORONARY STENT INTERVENTION;  Surgeon: Lawyer Bernardino Cough, MD;  Location: [facility] INVASIVE CV LAB;  Service: Cardiovascular;  Laterality: N/A;   ESOPHAGOGASTRODUODENOSCOPY N/A 12/02/2021   Procedure: ESOPHAGOGASTRODUODENOSCOPY (EGD);  Surgeon: Therisa Bi, MD;  Location: [facility] ENDOSCOPY;  Service: Gastroenterology;  Laterality: N/A;   LEFT HEART CATH AND  CORONARY ANGIOGRAPHY Left 02/16/2017   Procedure: LEFT HEART CATH AND CORONARY ANGIOGRAPHY;  Surgeon: Hester Wolm PARAS, MD;  Location: [facility] INVASIVE CV LAB;  Service: Cardiovascular;  Laterality: Left;   LEFT HEART CATH AND CORONARY ANGIOGRAPHY Left 12/24/2021   Procedure: LEFT HEART CATH AND CORONARY ANGIOGRAPHY;  Surgeon:  Hester Wolm PARAS, MD;  Location: [facility] INVASIVE CV LAB;  Service: Cardiovascular;  Laterality: Left;   LEFT HEART CATH AND CORONARY ANGIOGRAPHY Left 06/17/2022   Procedure: LEFT HEART CATH AND CORONARY ANGIOGRAPHY;  Surgeon: Florencio Cara BIRCH, MD;  Location: [facility] INVASIVE CV LAB;  Service: Cardiovascular;  Laterality: Left;   LEFT HEART CATH AND CORS/GRAFTS ANGIOGRAPHY Left 11/23/2017   Procedure: LEFT HEART CATH AND CORS/GRAFTS ANGIOGRAPHY;  Surgeon: Hester Wolm PARAS, MD;  Location: [facility] INVASIVE CV LAB;  Service: Cardiovascular;  Laterality: Left;   LEFT HEART CATH AND CORS/GRAFTS ANGIOGRAPHY N/A 02/13/2019   Procedure: LEFT HEART CATH AND CORS/GRAFTS ANGIOGRAPHY;  Surgeon: Hester Wolm PARAS, MD;  Location: [facility] INVASIVE CV LAB;  Service: Cardiovascular;  Laterality: N/A;   LEFT HEART CATH AND CORS/GRAFTS ANGIOGRAPHY N/A 07/03/2019   Procedure: LEFT HEART CATH AND CORS/GRAFTS ANGIOGRAPHY;  Surgeon: Hester Wolm PARAS, MD;  Location: [facility] INVASIVE CV LAB;  Service: Cardiovascular;  Laterality: N/A;   LEFT HEART CATH AND CORS/GRAFTS ANGIOGRAPHY N/A 11/04/2020   Procedure: LEFT HEART CATH AND CORS/GRAFTS ANGIOGRAPHY;  Surgeon: Hester Wolm PARAS, MD;  Location: [facility] INVASIVE CV LAB;  Service: Cardiovascular;  Laterality: N/A;   LEFT HEART CATH AND CORS/GRAFTS ANGIOGRAPHY N/A 02/14/2024   Procedure: LEFT HEART CATH AND CORS/GRAFTS ANGIOGRAPHY;  Surgeon: Ammon Blunt, MD;  Location: [facility] INVASIVE CV LAB;  Service: Cardiovascular;  Laterality: N/A;     reports that she has never smoked. She has never used smokeless tobacco. She reports that she does not drink alcohol and does not use drugs.  Allergies  Allergen Reactions   Penicillin G Anaphylaxis and Other (See Comments)    Has patient had a PCN reaction causing immediate rash, facial/tongue/throat swelling, SOB or lightheadedness with hypotension: [id number]} Has patient had a PCN reaction causing severe rash involving mucus membranes or skin  necrosis: no:[id number]} Has patient had a PCN reaction that required hospitalization no:[id number]} Has patient had a PCN reaction occurring within the last 10 years: no:[id number]} If all of the above answers are NO, then may proceed with Cephalosporin use.   Strawberry (Diagnostic)     Family History  Problem Relation Age of Onset   Diabetes Mother    Diabetes Father    Cancer Father    Diabetes Brother     Prior to Admission medications   Medication Sig Start Date End Date Taking? Authorizing Provider  aspirin  EC 81 MG tablet Take 81 mg by mouth daily.    [provider]  atorvastatin  (LIPITOR ) 40 MG tablet Take 2 tablets (80 mg total) by mouth daily. 02/14/24   Patel, Sona, MD  Blood Glucose Monitoring Suppl [facility] VERIO FLEX SYSTEM) w/Device KIT Use as directed twice a daily DX E11.65 07/22/22   McDonough, Tinnie POUR, PA-C  Budeson-Glycopyrrol-Formoterol  (BREZTRI  AEROSPHERE) 160-9-4.8 MCG/ACT AERO Inhale 2 puffs into the lungs 2 (two) times daily. 04/14/23   McDonough, Tinnie POUR, PA-C  clopidogrel  (PLAVIX ) 75 MG tablet Take 1 tablet (75 mg total) by mouth daily. 02/14/24   Patel, Sona, MD  empagliflozin  (JARDIANCE ) 25 MG TABS tablet Take one tab a day for diabetes 11/24/23   McDonough, Lauren K, PA-C  escitalopram  (LEXAPRO ) 10 MG tablet Take 1  tablet (10 mg total) by mouth daily. 11/24/23   McDonough, Tinnie POUR, PA-C  famotidine  (PEPCID ) 20 MG tablet Take 1 tablet (20 mg total) by mouth 2 (two) times daily. 11/24/23   McDonough, Tinnie POUR, PA-C  furosemide  (LASIX ) 40 MG tablet Take 1 tablet (40 mg total) by mouth daily. 02/14/24   Patel, Sona, MD  glucose blood [facility] VERIO) test strip Use as instructed twice daily DX E11.65 11/24/23   McDonough, Lauren K, PA-C  hydrALAZINE  (APRESOLINE ) 10 MG tablet Take 1 tablet (10 mg total) by mouth 3 (three) times daily. 11/24/23   McDonough, Tinnie POUR, PA-C  isosorbide  mononitrate (IMDUR ) 120 MG 24 hr tablet Take 1 tablet (120 mg total) by mouth 2  (two) times daily. 02/14/24   Patel, Sona, MD  losartan  (COZAAR ) 100 MG tablet Take 0.5 tablets (50 mg total) by mouth daily. 02/14/24   Patel, Sona, MD  methocarbamol  (ROBAXIN ) 500 MG tablet Take 500 mg by mouth 3 (three) times daily as needed for muscle spasms. 07/20/23   [provider]  metoprolol  succinate (TOPROL -XL) 100 MG 24 hr tablet Take 1 tablet (100 mg total) by mouth daily. 02/14/24   Tobie Calix, MD  Misc. Devices (ADJUST BATH/SHOWER SEAT) MISC 1 shower seat for use at home to reduce risk of falling. 01/13/22   Liana Fish, NP  montelukast  (SINGULAIR ) 10 MG tablet Take 1 tablet (10 mg total) by mouth at bedtime. 11/24/23   McDonough, Lauren K, PA-C  nitroGLYCERIN  (NITROSTAT ) 0.4 MG SL tablet DISSOLVE ONE TABLET UNDER THE TONGUE EVERY 5 MINUTES AS NEEDED FOR CHEST PAIN.  DO NOT EXCEED A TOTAL OF 3 DOSES IN 15 MINUTES 04/12/21   Khan, Fozia M, MD  OneTouch Delica Lancets 30G MISC USE 1  TO CHECK GLUCOSE TWICE DAILY AS DIRECTED 05/11/23   McDonough, Lauren K, PA-C  oxybutynin  (DITROPAN ) 5 MG tablet Take 1 tablet (5 mg total) by mouth daily. 11/24/23   McDonough, Tinnie POUR, PA-C  PROAIR  RESPICLICK 108 (90 Base) MCG/ACT AEPB Inhale 2 puffs into the lungs every 6 (six) hours as needed. 07/22/22   McDonough, Tinnie POUR, PA-C  ranolazine  (RANEXA ) 1000 MG SR tablet Take 1 tablet (1,000 mg total) by mouth 2 (two) times daily. 02/14/24   Patel, Sona, MD  tirzepatide  (MOUNJARO ) 2.5 MG/0.5ML Pen Inject 2.5 mg into the skin once a week. 11/24/23   McDonough, Lauren K, PA-C  traMADol  (ULTRAM ) 50 MG tablet Take 50 mg by mouth every 6 (six) hours as needed. for pain 09/20/23   [provider]  zolpidem  (AMBIEN ) 5 MG tablet Take 1 tablet (5 mg total) by mouth at bedtime as needed. for sleep 11/24/23   Kristina Tinnie POUR, PA-C    Physical Exam: Vitals:   02/15/24 0845 02/15/24 0900 02/15/24 0915 02/15/24 0930  BP: 115/73 113/71 103/72 116/80  Pulse: (!) 53 60 (!) 56 (!) 53  Resp: 14 15 20 16    Temp:      TempSrc:      SpO2: 95% 92% 93% 94%  Weight:      Height:        Physical Exam   Constitutional: Alert, awake, calm, comfortable HEENT: Neck supple Respiratory: Clear to auscultation B/L, no wheezing, no rales.  Cardiovascular: Regular rate and rhythm, no murmurs / rubs / gallops. No extremity edema. 2+ pedal pulses. No carotid bruits.  Abdomen: Soft, no tenderness, Bowel sounds positive.  Musculoskeletal: no clubbing / cyanosis. Good ROM, no contractures. Normal muscle tone.  Skin:  no rashes, lesions, ulcers. Neurologic: CN 2-12 grossly intact. Sensation intact, No focal deficit identified Psychiatric: Alert and oriented x 3. Normal mood.    Labs on Admission: I have personally reviewed following labs and imaging studies  CBC: Recent Labs  Lab 02/09/24 2201 02/11/24 0402 02/12/24 0854 02/14/24 0400 02/15/24 0214  WBC 9.9 8.1 11.5* 8.1 8.8  NEUTROABS 6.5  --   --   --   --   HGB 12.1 11.4* 12.0 11.7* 12.5  HCT 37.0 35.9* 36.6 36.7 39.4  MCV 79.7* 82.5 80.3 81.9 80.7  PLT 245 218 268 240 283   Basic Metabolic Panel: Recent Labs  Lab 02/09/24 2201 02/11/24 0402 02/12/24 0854 02/14/24 0400 02/15/24 0214  NA 136 136 135 136 134*  K 3.5 4.1 4.5 4.3 4.2  CL 103 101 98 101 101  CO2 24 26 23 24  21*  GLUCOSE 144* 171* 122* 113* 149*  BUN 19 23 18  33* 26*  CREATININE 0.70 1.28* 0.81 1.15* 0.92  CALCIUM  9.0 9.1 9.4 9.4 9.6   GFR: Estimated Creatinine Clearance: 75.9 mL/min (by C-G formula based on SCr of 0.92 mg/dL). Liver Function Tests: Recent Labs  Lab 02/09/24 2201  AST 22  ALT 15  ALKPHOS 60  BILITOT 0.6  PROT 7.3  ALBUMIN 3.4*   No results for input(s): LIPASE, AMYLASE in the last 168 hours. No results for input(s): AMMONIA in the last 168 hours. Coagulation Profile: Recent Labs  Lab 02/09/24 2210  INR 1.1   Cardiac Enzymes: Recent Labs  Lab 02/09/24 2359 02/10/24 0923 02/10/24 1028 02/15/24 0214 02/15/24 0435   TROPONINIHS 192* 164* 169* 818* 1,181*   BNP (last 3 results) Recent Labs    02/09/24 2201  BNP 99.1   HbA1C: No results for input(s): HGBA1C in the last 72 hours. CBG: Recent Labs  Lab 02/12/24 0359 02/14/24 1216 02/14/24 1441  GLUCAP 199* 112* 100*   Lipid Profile: No results for input(s): CHOL, HDL, LDLCALC, TRIG, CHOLHDL, LDLDIRECT in the last 72 hours. Thyroid  Function Tests: No results for input(s): TSH, T4TOTAL, FREET4, T3FREE, THYROIDAB in the last 72 hours. Anemia Panel: No results for input(s): VITAMINB12, FOLATE, FERRITIN, TIBC, IRON, RETICCTPCT in the last 72 hours. Urine analysis:    Component Value Date/Time   COLORURINE YELLOW (A) 03/06/2021 0736   APPEARANCEUR Clear 12/17/2022 1240   LABSPEC 1.031 (H) 03/06/2021 0736   PHURINE 5.0 03/06/2021 0736   GLUCOSEU Negative 12/17/2022 1240   HGBUR NEGATIVE 03/06/2021 0736   BILIRUBINUR Negative 12/17/2022 1240   KETONESUR NEGATIVE 03/06/2021 0736   PROTEINUR Negative 12/17/2022 1240   PROTEINUR NEGATIVE 03/06/2021 0736   NITRITE Negative 12/17/2022 1240   NITRITE NEGATIVE 03/06/2021 0736   LEUKOCYTESUR Negative 12/17/2022 1240   LEUKOCYTESUR NEGATIVE 03/06/2021 0736    Radiological Exams on Admission: I have personally reviewed images CARDIAC CATHETERIZATION Addendum Date: 02/15/2024   Ost Cx to Prox Cx lesion is 85% stenosed.   Prox Cx to Mid Cx lesion is 100% stenosed.   Prox LAD lesion is 70% stenosed.   Prox LAD to Mid LAD lesion is 90% stenosed.   Ost RCA to Prox RCA lesion is 100% stenosed.   Origin lesion is 100% stenosed.   Mid Graft lesion is 100% stenosed.   Dist LAD lesion is 80% stenosed.   Origin to Prox Graft lesion is 100% stenosed.   The left ventricular systolic function is normal.   LV end diastolic pressure is normal.   The left ventricular ejection  fraction is 50-55% by visual estimate. 1.  Severe three-vessel coronary artery disease with 90% stenosis  proximal/mid LAD, 85% stenosis ostial/proximal left circumflex, 100% stenosis proximal/mid left circumflex, 100% stenosis ostial RCA. Ipsi-collaterals from OM1-OM2/OM 3, contra collaterals from LAD/septal perforators to distal RCA 2.  Chronically occluded SVG to distal RCA, chronically occluded SVG to D1, occluded SVG to OM 2, patent LIMA to distal LAD 3.  Normal left ventricular function Recommendations 1.  Medical therapy 2.  Maximize medical therapy 3.  Aggressive risk factor modification  Result Date: 02/15/2024   Ost Cx to Prox Cx lesion is 85% stenosed.   Prox Cx to Mid Cx lesion is 100% stenosed.   Prox LAD lesion is 70% stenosed.   Prox LAD to Mid LAD lesion is 90% stenosed.   Ost RCA to Prox RCA lesion is 100% stenosed.   Origin lesion is 100% stenosed.   Mid Graft lesion is 100% stenosed.   Dist LAD lesion is 80% stenosed.   The left ventricular systolic function is normal.   LV end diastolic pressure is normal.   The left ventricular ejection fraction is 50-55% by visual estimate. 1.  Severe three-vessel coronary artery disease with 90% stenosis proximal/mid LAD, 85% stenosis ostial/proximal left circumflex, 100% stenosis proximal/mid left circumflex, 100% stenosis ostial RCA. Ipsi-collaterals from OM1-OM2/OM 3, contra collaterals from LAD/septal perforators to distal RCA 2.  Chronically occluded SVG to distal RCA, occluded SVG to OM 2, patent LIMA to distal LAD 3.  Normal left ventricular function Recommendations 1.  Medical therapy 2.  Maximize medical therapy 3.  Aggressive risk factor modification   DG Chest 2 View Result Date: 02/15/2024 CLINICAL DATA:  Chest pain EXAM: CHEST - 2 VIEW COMPARISON:  02/09/2024 FINDINGS: Cardiac shadow is enlarged but stable. Postsurgical changes are again seen. The lungs are well aerated bilaterally. Basilar atelectasis is noted bilaterally. No focal infiltrate or effusion is seen. IMPRESSION: Bibasilar atelectatic changes. Electronically Signed   By: Oneil Devonshire  M.D.   On: 02/15/2024 02:34    EKG: My personal interpretation of EKG shows: Sinus rhythm, no acute ST elevation    Assessment/Plan Principal Problem:   Chest pain Active Problems:   Essential hypertension   Dyslipidemia   STEMI 10/18/15   Hx of CABG 2010   Diabetes mellitus (HCC)   Morbid obesity (HCC)    Assessment and Plan:  68 year old female with known history of coronary artery disease recently discharged home after being treated for acute coronary syndrome and left heart catheterization.  Coming back with similar chest pain.  1.  Acute coronary syndrome/angina - Patient has multivessel coronary artery disease - Cardiology advised for medical management. - Patient was started on heparin  drip in the emergency room. - Will continue heparin  drip, continue aspirin  Plavix  statin beta-blocker. - Continue to monitor in telemetry - Pain medication with morphine  follow cardiology recommendation - Follow-up cardiology recommendation  2.  Hypertension - Blood pressures stable - Continue to monitor blood pressure and resume home medications  3.  HLD - Continue statin  4.  Diabetes - Continue insulin  sliding scale  5.  GERD Continue Pepcid    DVT prophylaxis: IV heparin  gtts Code Status: Full Code Family Communication: Not available Disposition Plan: Home Consults called: Cardiology Admission status: Observation, Telemetry bed   Nena Rebel, MD [facility] 02/15/2024, 9:46 AM

## 2024-02-15 NOTE — Consult Note (Signed)
 [facility] CARDIOLOGY CONSULT NOTE       Patient ID: Felicia Woods MRN: [id number] DOB/AGE: 02-14-1956 68 y.o.  Admit date: 02/15/2024 Referring Physician Dr. Roann Primary Physician McDonough, Tinnie POUR, PA-C Primary Cardiologist Dr. Florencio Reason for Consultation chest pain  HPI: Felicia Woods is a 68 y.o. female  with a past medical history of coronary artery disease s/p CABGx4, multiple stents, chronic angina, hypertension, hyperlipidemia, type 2 diabetes mellitus, chronic HFpEF, obesity, asthma, COPD  who presented to the ED on 02/15/2024 for recurrent chest pressure/heaviness. Patient had recent diagnostic LHC on 08/12 that revealed severe 3-vessel CAD with chronically occluded SVG to distal RCA, chronically occluded SVG to D1 and occluded SVG to OM2, contra collaterals from LAD/septal perforators to distal RCA, no intervention performed and optimized medical management. Patient was discharged on 08/12. Patient states at home while in bed she developed severe chest pressure that radiates to left arm associated with diaphoresis and SOB. Patient reported to ED due to recurrent/severe chest pressure. Cardiology was consulted for further evaluation.   Work up in the ED notable for Na 134, K 4.2, Cr 0.92, Hgb 12.5, plts 283. Trops elevated and trended 818 > 1181. EKG with sinus rhythm, rate 51 bpm with RBBB. CXR without acute cardiopulmonary disease. Patient started on IV heparin  infusion and IV nitroglycerin  gtt.  At the time of my evaluation this AM, patient was resting in ED stretcher in no acute distress. We discussed patient sxs in further detail. Patient states prior to discharge from hospital yesterday she felt stable and felt that chest pressure has improved and remained stable. Around 6PM while in bed on 08/12 developed severe chest pressure that radiated to left arm a/w with SOB and diaphoresis. Patient denies any palpitations, leg swelling or lightheadedness. Patient states her  chest pressure was initially 12/10 and since admission has improved to 7/10 s/p IV heparin  and nitroglycerin . Right groin incision site is clean and dry with no evidence of significant swelling, bruising, or active bleeding.     Pertinent Cardiac History (Most recent) LHC (08/12) with Dr. Ammon Pais Cx to Prox Cx lesion is 85% stenosed.   Prox Cx to Mid Cx lesion is 100% stenosed.   Prox LAD lesion is 70% stenosed.   Prox LAD to Mid LAD lesion is 90% stenosed.   Ost RCA to Prox RCA lesion is 100% stenosed.   Origin lesion is 100% stenosed.   Mid Graft lesion is 100% stenosed.   Dist LAD lesion is 80% stenosed.   Origin to Prox Graft lesion is 100% stenosed.   The left ventricular systolic function is normal.   LV end diastolic pressure is normal.   The left ventricular ejection fraction is 50-55% by visual estimate.   1.  Severe three-vessel coronary artery disease with 90% stenosis proximal/mid LAD, 85% stenosis ostial/proximal left circumflex, 100% stenosis proximal/mid left circumflex, 100% stenosis ostial RCA. Ipsi-collaterals from OM1-OM2/OM 3, contra collaterals from LAD/septal perforators to distal RCA 2.  Chronically occluded SVG to distal RCA, chronically occluded SVG to D1, occluded SVG to OM 2, patent LIMA to distal LAD 3.  Normal left ventricular function    Review of systems complete and found to be negative unless listed above    Past Medical History:  Diagnosis Date   Asthma    Coronary artery disease    Diabetes mellitus without complication (HCC)    Heart attack (HCC)    Hyperlipidemia    Hypertension    MI (myocardial  infarction) (HCC)    Migraine headache with aura    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    Past Surgical History:  Procedure Laterality Date   ABDOMINAL HYSTERECTOMY     CARDIAC CATHETERIZATION     CARDIAC CATHETERIZATION N/A 10/18/2015   Procedure: Left Heart Cath and Cors/Grafts Angiography;  Surgeon: Dorn JINNY Lesches, MD;  Location: [facility]  INVASIVE CV LAB;  Service: Cardiovascular;  Laterality: N/A;   CARDIAC CATHETERIZATION N/A 10/18/2015   Procedure: Coronary Stent Intervention;  Surgeon: Dorn JINNY Lesches, MD;  Location: [facility] INVASIVE CV LAB;  Service: Cardiovascular;  Laterality: N/A;   CARDIAC SURGERY     CHOLECYSTECTOMY     COLONOSCOPY WITH PROPOFOL  N/A 12/02/2021   Procedure: COLONOSCOPY WITH PROPOFOL ;  Surgeon: Therisa Bi, MD;  Location: [facility] ENDOSCOPY;  Service: Gastroenterology;  Laterality: N/A;   CORONARY ANGIOPLASTY     CORONARY ARTERY BYPASS GRAFT     4 vessels - 2010   CORONARY STENT INTERVENTION N/A 02/16/2017   Procedure: CORONARY STENT INTERVENTION;  Surgeon: Florencio Cara BIRCH, MD;  Location: [facility] INVASIVE CV LAB;  Service: Cardiovascular;  Laterality: N/A;   CORONARY STENT INTERVENTION N/A 02/13/2019   Procedure: CORONARY STENT INTERVENTION;  Surgeon: Mady Bruckner, MD;  Location: [facility] INVASIVE CV LAB;  Service: Cardiovascular;  Laterality: N/A;  SVG to RCA   CORONARY STENT INTERVENTION Left 03/08/2019   Procedure: CORONARY STENT INTERVENTION;  Surgeon: Florencio Cara BIRCH, MD;  Location: [facility] INVASIVE CV LAB;  Service: Cardiovascular;  Laterality: Left;   CORONARY STENT INTERVENTION N/A 11/04/2020   Procedure: CORONARY STENT INTERVENTION;  Surgeon: Mady Bruckner, MD;  Location: [facility] INVASIVE CV LAB;  Service: Cardiovascular;  Laterality: N/A;   CORONARY STENT INTERVENTION N/A 12/24/2021   Procedure: CORONARY STENT INTERVENTION;  Surgeon: Lawyer Bernardino Cough, MD;  Location: [facility] INVASIVE CV LAB;  Service: Cardiovascular;  Laterality: N/A;   ESOPHAGOGASTRODUODENOSCOPY N/A 12/02/2021   Procedure: ESOPHAGOGASTRODUODENOSCOPY (EGD);  Surgeon: Therisa Bi, MD;  Location: [facility] ENDOSCOPY;  Service: Gastroenterology;  Laterality: N/A;   LEFT HEART CATH AND CORONARY ANGIOGRAPHY Left 02/16/2017   Procedure: LEFT HEART CATH AND CORONARY ANGIOGRAPHY;  Surgeon: Hester Wolm JINNY, MD;  Location: [facility] INVASIVE CV LAB;  Service:  Cardiovascular;  Laterality: Left;   LEFT HEART CATH AND CORONARY ANGIOGRAPHY Left 12/24/2021   Procedure: LEFT HEART CATH AND CORONARY ANGIOGRAPHY;  Surgeon: Hester Wolm JINNY, MD;  Location: [facility] INVASIVE CV LAB;  Service: Cardiovascular;  Laterality: Left;   LEFT HEART CATH AND CORONARY ANGIOGRAPHY Left 06/17/2022   Procedure: LEFT HEART CATH AND CORONARY ANGIOGRAPHY;  Surgeon: Florencio Cara BIRCH, MD;  Location: [facility] INVASIVE CV LAB;  Service: Cardiovascular;  Laterality: Left;   LEFT HEART CATH AND CORS/GRAFTS ANGIOGRAPHY Left 11/23/2017   Procedure: LEFT HEART CATH AND CORS/GRAFTS ANGIOGRAPHY;  Surgeon: Hester Wolm JINNY, MD;  Location: [facility] INVASIVE CV LAB;  Service: Cardiovascular;  Laterality: Left;   LEFT HEART CATH AND CORS/GRAFTS ANGIOGRAPHY N/A 02/13/2019   Procedure: LEFT HEART CATH AND CORS/GRAFTS ANGIOGRAPHY;  Surgeon: Hester Wolm JINNY, MD;  Location: [facility] INVASIVE CV LAB;  Service: Cardiovascular;  Laterality: N/A;   LEFT HEART CATH AND CORS/GRAFTS ANGIOGRAPHY N/A 07/03/2019   Procedure: LEFT HEART CATH AND CORS/GRAFTS ANGIOGRAPHY;  Surgeon: Hester Wolm JINNY, MD;  Location: [facility] INVASIVE CV LAB;  Service: Cardiovascular;  Laterality: N/A;   LEFT HEART CATH AND CORS/GRAFTS ANGIOGRAPHY N/A 11/04/2020   Procedure: LEFT HEART CATH AND CORS/GRAFTS ANGIOGRAPHY;  Surgeon: Hester Wolm JINNY, MD;  Location: [facility] INVASIVE CV LAB;  Service: Cardiovascular;  Laterality: N/A;   LEFT HEART CATH AND CORS/GRAFTS ANGIOGRAPHY N/A 02/14/2024   Procedure: LEFT HEART CATH AND CORS/GRAFTS ANGIOGRAPHY;  Surgeon: Ammon Blunt, MD;  Location: [facility] INVASIVE CV LAB;  Service: Cardiovascular;  Laterality: N/A;    (Not in a hospital admission)  Social History   Socioeconomic History   Marital status: Widowed    Spouse name: lynwood   Number of children: Not on file   Years of education: Not on file   Highest education level: Not on file  Occupational History   Not on file  Tobacco Use   Smoking  status: Never   Smokeless tobacco: Never  Vaping Use   Vaping status: Never Used  Substance and Sexual Activity   Alcohol use: No    Alcohol/week: 0.0 standard drinks of alcohol   Drug use: No   Sexual activity: Yes  Other Topics Concern   Not on file  Social History Narrative   Not on file   Social Drivers of Health   Financial Resource Strain: Low Risk  (01/27/2024)   Received from [facility]   Overall Financial Resource Strain (CARDIA)    Difficulty of Paying Living Expenses: Not very hard  Food Insecurity: No Food Insecurity (02/10/2024)   Hunger Vital Sign    Worried About Running Out of Food in the Last Year: Never true    Ran Out of Food in the Last Year: Never true  Recent Concern: Food Insecurity - Food Insecurity Present (01/27/2024)   Received from [facility]   Hunger Vital Sign    Within the past 12 months, you worried that your food would run out before you got the money to buy more.: Sometimes true    Within the past 12 months, the food you bought just didn't last and you didn't have money to get more.: Sometimes true  Transportation Needs: No Transportation Needs (02/10/2024)   PRAPARE - Administrator, Civil Service (Medical): No    Lack of Transportation (Non-Medical): No  Physical Activity: Not on file  Stress: Not on file  Social Connections: Socially Integrated (02/10/2024)   Social Connection and Isolation Panel    Frequency of Communication with Friends and Family: Three times a week    Frequency of Social Gatherings with Friends and Family: Three times a week    Attends Religious Services: More than 4 times per year    Active Member of Clubs or Organizations: No    Attends Banker Meetings: 1 to 4 times per year    Marital Status: Married  Catering manager Violence: Not At Risk (02/10/2024)   Humiliation, Afraid, Rape, and Kick questionnaire    Fear of Current or Ex-Partner: No    Emotionally  Abused: No    Physically Abused: No    Sexually Abused: No    Family History  Problem Relation Age of Onset   Diabetes Mother    Diabetes Father    Cancer Father    Diabetes Brother      Vitals:   02/15/24 0730 02/15/24 0802 02/15/24 0815 02/15/24 0830  BP: 115/67 114/74 106/72 116/73  Pulse: (!) 55 (!) 57 (!) 51 (!) 53  Resp: 16 11 19 19   Temp: 97.9 F (36.6 C)     TempSrc:      SpO2: 96% 95% 93% 95%  Weight:      Height:        PHYSICAL EXAM General: Well appearing  elderly female, well nourished, in no acute distress. HEENT: Normocephalic and atraumatic. Neck: No JVD.   Lungs: Normal respiratory effort on room air. Clear bilaterally to auscultation. No wheezes, crackles, rhonchi.  Heart: HRRR. Normal S1 and S2 without gallops or murmurs.  Abdomen: Non-distended appearing.  Msk: Normal strength and tone for age. Extremities: Warm and well perfused. No clubbing, cyanosis, edema.  Neuro: Alert and oriented X 3. Psych: Answers questions appropriately.   Labs: Basic Metabolic Panel: Recent Labs    02/14/24 0400 02/15/24 0214  NA 136 134*  K 4.3 4.2  CL 101 101  CO2 24 21*  GLUCOSE 113* 149*  BUN 33* 26*  CREATININE 1.15* 0.92  CALCIUM  9.4 9.6   Liver Function Tests: No results for input(s): AST, ALT, ALKPHOS, BILITOT, PROT, ALBUMIN in the last 72 hours. No results for input(s): LIPASE, AMYLASE in the last 72 hours. CBC: Recent Labs    02/14/24 0400 02/15/24 0214  WBC 8.1 8.8  HGB 11.7* 12.5  HCT 36.7 39.4  MCV 81.9 80.7  PLT 240 283   Cardiac Enzymes: Recent Labs    02/15/24 0214 02/15/24 0435  TROPONINIHS 818* 1,181*   BNP: No results for input(s): BNP in the last 72 hours. D-Dimer: No results for input(s): DDIMER in the last 72 hours. Hemoglobin A1C: No results for input(s): HGBA1C in the last 72 hours. Fasting Lipid Panel: No results for input(s): CHOL, HDL, LDLCALC, TRIG, CHOLHDL, LDLDIRECT in the last  72 hours. Thyroid  Function Tests: No results for input(s): TSH, T4TOTAL, T3FREE, THYROIDAB in the last 72 hours.  Invalid input(s): FREET3 Anemia Panel: No results for input(s): VITAMINB12, FOLATE, FERRITIN, TIBC, IRON, RETICCTPCT in the last 72 hours.   Radiology: CARDIAC CATHETERIZATION Addendum Date: 02/15/2024   Lida Cx to Prox Cx lesion is 85% stenosed.   Prox Cx to Mid Cx lesion is 100% stenosed.   Prox LAD lesion is 70% stenosed.   Prox LAD to Mid LAD lesion is 90% stenosed.   Ost RCA to Prox RCA lesion is 100% stenosed.   Origin lesion is 100% stenosed.   Mid Graft lesion is 100% stenosed.   Dist LAD lesion is 80% stenosed.   Origin to Prox Graft lesion is 100% stenosed.   The left ventricular systolic function is normal.   LV end diastolic pressure is normal.   The left ventricular ejection fraction is 50-55% by visual estimate. 1.  Severe three-vessel coronary artery disease with 90% stenosis proximal/mid LAD, 85% stenosis ostial/proximal left circumflex, 100% stenosis proximal/mid left circumflex, 100% stenosis ostial RCA. Ipsi-collaterals from OM1-OM2/OM 3, contra collaterals from LAD/septal perforators to distal RCA 2.  Chronically occluded SVG to distal RCA, chronically occluded SVG to D1, occluded SVG to OM 2, patent LIMA to distal LAD 3.  Normal left ventricular function Recommendations 1.  Medical therapy 2.  Maximize medical therapy 3.  Aggressive risk factor modification  Result Date: 02/15/2024   Ost Cx to Prox Cx lesion is 85% stenosed.   Prox Cx to Mid Cx lesion is 100% stenosed.   Prox LAD lesion is 70% stenosed.   Prox LAD to Mid LAD lesion is 90% stenosed.   Ost RCA to Prox RCA lesion is 100% stenosed.   Origin lesion is 100% stenosed.   Mid Graft lesion is 100% stenosed.   Dist LAD lesion is 80% stenosed.   The left ventricular systolic function is normal.   LV end diastolic pressure is normal.   The left ventricular ejection fraction  is 50-55% by visual  estimate. 1.  Severe three-vessel coronary artery disease with 90% stenosis proximal/mid LAD, 85% stenosis ostial/proximal left circumflex, 100% stenosis proximal/mid left circumflex, 100% stenosis ostial RCA. Ipsi-collaterals from OM1-OM2/OM 3, contra collaterals from LAD/septal perforators to distal RCA 2.  Chronically occluded SVG to distal RCA, occluded SVG to OM 2, patent LIMA to distal LAD 3.  Normal left ventricular function Recommendations 1.  Medical therapy 2.  Maximize medical therapy 3.  Aggressive risk factor modification   DG Chest 2 View Result Date: 02/15/2024 CLINICAL DATA:  Chest pain EXAM: CHEST - 2 VIEW COMPARISON:  02/09/2024 FINDINGS: Cardiac shadow is enlarged but stable. Postsurgical changes are again seen. The lungs are well aerated bilaterally. Basilar atelectasis is noted bilaterally. No focal infiltrate or effusion is seen. IMPRESSION: Bibasilar atelectatic changes. Electronically Signed   By: Oneil Devonshire M.D.   On: 02/15/2024 02:34   ECHOCARDIOGRAM COMPLETE Result Date: 02/11/2024    ECHOCARDIOGRAM REPORT   Patient Name:   Felicia Woods Date of Exam: 02/10/2024 Medical Rec #:  [id number]      Height:       67.0 in Accession #:    [id number]     Weight:       223.0 lb Date of Birth:  05-04-1956     BSA:          2.118 m Patient Age:    67 years       BP:           102/84 mmHg Patient Gender: F              HR:           61 bpm. Exam Location:  [facility] Procedure: 2D Echo, Cardiac Doppler and Color Doppler (Both Spectral and Color            Flow Doppler were utilized during procedure). Indications:     Chest Pain R07.9  History:         Patient has prior history of Echocardiogram examinations, most                  recent 04/07/2022. CHF, Previous Myocardial Infarction, Acute                  MI, CAD and Angina, Prior CABG, Signs/Symptoms:Shortness of                  Breath; Risk Factors:Diabetes and Dyslipidemia.  Sonographer:     Thea Norlander RCS Referring Phys:  [id number] DORENE COMFORT Diagnosing Phys: Cara JONETTA Lovelace MD IMPRESSIONS  1. Left ventricular ejection fraction, by estimation, is 55 to 60%. The left ventricle has normal function. The left ventricle has no regional wall motion abnormalities. Left ventricular diastolic parameters were normal.  2. Right ventricular systolic function is normal. The right ventricular size is normal.  3. The mitral valve is normal in structure. Trivial mitral valve regurgitation.  4. The aortic valve is normal in structure. Aortic valve regurgitation is not visualized. FINDINGS  Left Ventricle: Left ventricular ejection fraction, by estimation, is 55 to 60%. The left ventricle has normal function. The left ventricle has no regional wall motion abnormalities. Strain was performed and the global longitudinal strain is indeterminate. The left ventricular internal cavity size was normal in size. There is no left ventricular hypertrophy. Left ventricular diastolic parameters were normal. Right Ventricle: The right ventricular size is normal. No increase in right ventricular wall thickness. Right ventricular systolic function  is normal. Left Atrium: Left atrial size was normal in size. Right Atrium: Right atrial size was normal in size. Pericardium: There is no evidence of pericardial effusion. Mitral Valve: The mitral valve is normal in structure. Trivial mitral valve regurgitation. Tricuspid Valve: The tricuspid valve is normal in structure. Tricuspid valve regurgitation is not demonstrated. Aortic Valve: The aortic valve is normal in structure. Aortic valve regurgitation is not visualized. Aortic valve peak gradient measures 4.2 mmHg. Pulmonic Valve: The pulmonic valve was normal in structure. Pulmonic valve regurgitation is not visualized. Aorta: The ascending aorta was not well visualized. IAS/Shunts: No atrial level shunt detected by color flow Doppler. Additional Comments: 3D was performed not requiring image post processing on an independent  workstation and was indeterminate.  LEFT VENTRICLE PLAX 2D LVIDd:         3.80 cm   Diastology LVIDs:         2.70 cm   LV e' medial:    4.24 cm/s LV PW:         1.05 cm   LV E/e' medial:  15.4 LV IVS:        0.80 cm   LV e' lateral:   7.72 cm/s LVOT diam:     1.90 cm   LV E/e' lateral: 8.5 LV SV:         37 LV SV Index:   17 LVOT Area:     2.84 cm  RIGHT VENTRICLE            IVC RV S prime:     4.20 cm/s  IVC diam: 1.60 cm LEFT ATRIUM         Index LA diam:    3.55 cm 1.68 cm/m  AORTIC VALVE AV Area (Vmax): 2.20 cm AV Vmax:        102.00 cm/s AV Peak Grad:   4.2 mmHg LVOT Vmax:      79.10 cm/s LVOT Vmean:     49.800 cm/s LVOT VTI:       0.129 m  AORTA Ao Root diam: 2.90 cm Ao Asc diam:  3.00 cm MITRAL VALVE MV Area (PHT): 3.99 cm    SHUNTS MV Decel Time: 190 msec    Systemic VTI:  0.13 m MV E velocity: 65.50 cm/s  Systemic Diam: 1.90 cm MV A velocity: 56.60 cm/s MV E/A ratio:  1.16 Dwayne JONETTA Lovelace MD Electronically signed by Cara JONETTA Lovelace MD Signature Date/Time: 02/11/2024/11:13:05 AM    Final    DG Chest Port 1 View Result Date: 02/09/2024 CLINICAL DATA:  Chest pain EXAM: PORTABLE CHEST 1 VIEW COMPARISON:  04/06/2022 FINDINGS: Stable cardiomediastinal silhouette. Aortic atherosclerotic calcification. Sternotomy. Bibasilar airspace opacities favor atelectasis. No pleural effusion or pneumothorax. IMPRESSION: Bibasilar airspace opacities favor atelectasis. Electronically Signed   By: Norman Gatlin M.D.   On: 02/09/2024 22:14    ECHO as above  TELEMETRY reviewed by me 02/15/2024: sinus rhythm, rate 60s  EKG reviewed by me:  Trops elevated and trended 818 > 1181. EKG with sinus rhythm, rate 51 bpm with RBBB.  Data reviewed by me 02/15/2024: last 24h vitals tele labs imaging I/O ED provider note, admission H&P.  Principal Problem:   Chest pain    ASSESSMENT AND PLAN:  Felicia Woods is a 68 y.o. female  with a past medical history of coronary artery disease s/p CABGx4, multiple stents, chronic  angina, hypertension, hyperlipidemia, type 2 diabetes mellitus, chronic HFpEF, obesity, asthma, COPD  who presented to the ED on 02/15/2024  for recurrent chest pressure/heaviness. Patient had recent diagnostic LHC on 08/12 that revealed severe 3-vessel CAD with chronically occluded SVG to distal RCA, chronically occluded SVG to D1 and occluded SVG to OM2, contra collaterals from LAD/septal perforators to distal RCA, no intervention performed and optimized medical management. Patient was discharged on 08/12. Patient states at home while in bed she developed severe chest pressure that radiates to left arm associated with diaphoresis and SOB. Patient reported to ED due to recurrent/severe chest pressure. Cardiology was consulted for further evaluation.   # Chronic Angina # Coronary artery disease s/p CABG x 4, multiple stents # Hypertension # Hyperlipidemia  # Elevated troponin, setting of chronically occluded grafts -Continue IV heparin  infusion. -Wean off IV nitroglycerin  gtt when able and resume home Imdur .  -Ordered PRN IV morphine  for severe chest pressure/pain. -Continue aspirin  81 mg, plavix  75 mg daily.  -Continue atorvastatin  to 80 mg daily. Recommend goal LDL < 50. -Continue losartan  50 mg daily.  -Continue amlodipine  5 mg daily.  -Resume home Imdur  120 mg twice daily when off nitroglycerin  gtt.  -Continue home Ranexa  1000 mg twice daily -Continue home metoprolol  succinate 100 mg daily. Uptitration as HR allows. -Discussed case with [facility] interventionists and recommend to continued with medical management. Acute intervention can cause more harm at this time. Ongoing discussions about [facility] reviewing all prior heart catheterizations to potentially intervene in the future, this can be discussed at outpatient follow-up.    # Chronic HFpEF Appears euvolemic on exam. -Resume home lasix  40 mg daily   -Reusme home dapagliflozin  25 mg daily. -Losartan , metoprolol  as stated above.   This  patient's plan of care was discussed and created with Dr. Ammon and he is in agreement.  Signed: Dorene Comfort, PA-C  02/15/2024, 9:27 AM [facility] Cardiology

## 2024-02-15 NOTE — ED Notes (Signed)
 Patient called out stating chest pain 10/10, will medicate per [facility].

## 2024-02-15 NOTE — ED Notes (Signed)
 Patient eating breakfast tray

## 2024-02-15 NOTE — Progress Notes (Signed)
 PHARMACY - ANTICOAGULATION CONSULT NOTE  Pharmacy Consult for Heparin   Indication: chest pain/ACS  Allergies  Allergen Reactions   Penicillin G Anaphylaxis and Other (See Comments)    Has patient had a PCN reaction causing immediate rash, facial/tongue/throat swelling, SOB or lightheadedness with hypotension: [id number]} Has patient had a PCN reaction causing severe rash involving mucus membranes or skin necrosis: no:[id number]} Has patient had a PCN reaction that required hospitalization no:[id number]} Has patient had a PCN reaction occurring within the last 10 years: no:[id number]} If all of the above answers are NO, then may proceed with Cephalosporin use.   Strawberry (Diagnostic)     Patient Measurements: Height: 5' 7 (170.2 cm) Weight: 107.3 kg (236 lb 8 oz) IBW/kg (Calculated) : 61.6 HEPARIN  DW (KG): 86.1  Vital Signs: Temp: 97.9 F (36.6 C) (08/13 0730) Temp Source: Oral (08/13 0213) BP: 120/65 (08/13 1147) Pulse Rate: 58 (08/13 1147)  Labs: Recent Labs    02/14/24 0400 02/15/24 0214 02/15/24 0435 02/15/24 1007  HGB 11.7* 12.5  --   --   HCT 36.7 39.4  --   --   PLT 240 283  --   --   HEPARINUNFRC  --   --   --  0.56  CREATININE 1.15* 0.92  --   --   TROPONINIHS  --  818* 1,181*  --     Estimated Creatinine Clearance: 75.9 mL/min (by C-G formula based on SCr of 0.92 mg/dL).   Medical History: Past Medical History:  Diagnosis Date   Asthma    Coronary artery disease    Diabetes mellitus without complication (HCC)    Heart attack (HCC)    Hyperlipidemia    Hypertension    MI (myocardial infarction) (HCC)    Migraine headache with aura    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    Medications:  (Not in a hospital admission)   Assessment: Pharmacy consulted to dose heparin  in this 68 year old female admitted with ACS/NSTEMI.   No prior anticoag noted.  CrCl = 75.9 ml/min   Goal of Therapy:  Heparin  level 0.3-0.7 units/ml Monitor platelets by  anticoagulation protocol: Yes  8/13@1007 : HL 0.56, therapeutic x 1   Plan:  Continue heparin  infusion at 1200 units/hr Check confirmatory anti-Xa level in 6 hours and daily while on heparin  Continue to monitor H&H and platelets  Kahlen Morais A Denton Derks 02/15/2024,12:01 PM

## 2024-02-15 NOTE — ED Notes (Signed)
 Pharmacy aware of missing dose of Ranexa .

## 2024-02-15 NOTE — Progress Notes (Signed)
 PHARMACY - ANTICOAGULATION CONSULT NOTE  Pharmacy Consult for Heparin   Indication: chest pain/ACS  Allergies  Allergen Reactions   Penicillin G Anaphylaxis and Other (See Comments)    Has patient had a PCN reaction causing immediate rash, facial/tongue/throat swelling, SOB or lightheadedness with hypotension: bzd:[id number]} Has patient had a PCN reaction causing severe rash involving mucus membranes or skin necrosis: no:[id number]} Has patient had a PCN reaction that required hospitalization no:[id number]} Has patient had a PCN reaction occurring within the last 10 years: no:[id number]} If all of the above answers are NO, then may proceed with Cephalosporin use.   Strawberry (Diagnostic)     Patient Measurements: Height: 5' 7 (170.2 cm) Weight: 107.3 kg (236 lb 8 oz) IBW/kg (Calculated) : 61.6 HEPARIN  DW (KG): 86.1  Vital Signs: Temp: 98.8 F (37.1 C) (08/13 0213) Temp Source: Oral (08/13 0213) BP: 148/76 (08/13 0213) Pulse Rate: 62 (08/13 0213)  Labs: Recent Labs    02/12/24 0854 02/14/24 0400 02/15/24 0214  HGB 12.0 11.7* 12.5  HCT 36.6 36.7 39.4  PLT 268 240 283  CREATININE 0.81 1.15* 0.92  TROPONINIHS  --   --  818*    Estimated Creatinine Clearance: 75.9 mL/min (by C-G formula based on SCr of 0.92 mg/dL).   Medical History: Past Medical History:  Diagnosis Date   Asthma    Coronary artery disease    Diabetes mellitus without complication (HCC)    Heart attack (HCC)    Hyperlipidemia    Hypertension    MI (myocardial infarction) (HCC)    Migraine headache with aura    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    Medications:  (Not in a hospital admission)   Assessment: Pharmacy consulted to dose heparin  in this 68 year old female admitted with ACS/NSTEMI.   No prior anticoag noted.  CrCl = 75.9 ml/min   Goal of Therapy:  Heparin  level 0.3-0.7 units/ml Monitor platelets by anticoagulation protocol: Yes   Plan:  Give 4000 units bolus x 1 Start heparin   infusion at 1200 units/hr Check anti-Xa level in 6 hours and daily while on heparin  Continue to monitor H&H and platelets  Johny Pitstick D 02/15/2024,3:39 AM

## 2024-02-15 NOTE — ED Notes (Signed)
 Chart reviewed for heparin  level result and lab has not processed the result drawn at 1007 by previous RN. This RN contacted lab and they stated they had the specimen however no one ran it due to chart being locked at that time. Lab tech encouraged to reach out to staff for follow up and she stated a new personnel in the lab received the blood and did not run it. This nurse expressed concern that it was 1 hour and 45 min since collected and should have had follow up. Lab tech to follow up with personnel.

## 2024-02-15 NOTE — ED Notes (Addendum)
 NP Jesus consulted on holding imdur  and hydralizine r/t bp 101/72. Ok to hold.

## 2024-02-15 NOTE — Progress Notes (Signed)
 PHARMACY - ANTICOAGULATION CONSULT NOTE  Pharmacy Consult for Heparin   Indication: chest pain/ACS  Allergies  Allergen Reactions   Penicillin G Anaphylaxis and Other (See Comments)    Has patient had a PCN reaction causing immediate rash, facial/tongue/throat swelling, SOB or lightheadedness with hypotension: bzd:[id number]} Has patient had a PCN reaction causing severe rash involving mucus membranes or skin necrosis: no:[id number]} Has patient had a PCN reaction that required hospitalization no:[id number]} Has patient had a PCN reaction occurring within the last 10 years: no:[id number]} If all of the above answers are NO, then may proceed with Cephalosporin use.   Strawberry (Diagnostic)     Patient Measurements: Height: 5' 7 (170.2 cm) Weight: 107.3 kg (236 lb 8 oz) IBW/kg (Calculated) : 61.6 HEPARIN  DW (KG): 86.1  Vital Signs: Temp: 97.9 F (36.6 C) (08/13 0730) BP: 115/63 (08/13 1630) Pulse Rate: 52 (08/13 1630)  Labs: Recent Labs    02/14/24 0400 02/15/24 0214 02/15/24 0435 02/15/24 1007 02/15/24 1600  HGB 11.7* 12.5  --   --   --   HCT 36.7 39.4  --   --   --   PLT 240 283  --   --   --   HEPARINUNFRC  --   --   --  0.56 0.58  CREATININE 1.15* 0.92  --   --   --   TROPONINIHS  --  818* 1,181*  --   --     Estimated Creatinine Clearance: 75.9 mL/min (by C-G formula based on SCr of 0.92 mg/dL).   Medical History: Past Medical History:  Diagnosis Date   Asthma    Coronary artery disease    Diabetes mellitus without complication (HCC)    Heart attack (HCC)    Hyperlipidemia    Hypertension    MI (myocardial infarction) (HCC)    Migraine headache with aura    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    Medications:  (Not in a hospital admission)   Assessment: Pharmacy consulted to dose heparin  in this 68 year old female admitted with ACS/NSTEMI.   No prior anticoag noted.  CrCl = 75.9 ml/min   Goal of Therapy:  Heparin  level 0.3-0.7 units/ml Monitor  platelets by anticoagulation protocol: Yes  8/13@1007 : HL 0.56, therapeutic x 1 8/13@1600 : HL 0.58, therapeutic x 2   Plan:  Continue heparin  infusion at 1200 units/hr Recheck heparin  level tomorrow with morning labs Continue to monitor H&H and platelets  Darneshia Demary A Brooke Payes 02/15/2024,5:09 PM

## 2024-02-15 NOTE — ED Notes (Signed)
 Patient titrated off O2, now on room air with no complaints.

## 2024-02-15 NOTE — ED Notes (Signed)
Blanket and orange juice provided

## 2024-02-15 NOTE — ED Notes (Signed)
 Patient up and to toilet, back to bed without incident. Phone provided. Will continue to monitor.

## 2024-02-15 NOTE — ED Notes (Signed)
 Placed patient on 2L nasal cannula while she was sleeping. Patient does not currently feel SOB, is resting comfortably.

## 2024-02-15 NOTE — Group Note (Deleted)
 Date:  02/15/2024 Time:  2:22 PM  Group Topic/Focus:  Wellness Toolbox:   The focus of this group is to discuss various aspects of wellness, balancing those aspects and exploring ways to increase the ability to experience wellness.  Patients will create a wellness toolbox for use upon discharge.     Participation Level:  {BHH PARTICIPATION OZCZO:77735}  Participation Quality:  {BHH PARTICIPATION QUALITY:22265}  Affect:  {BHH AFFECT:22266}  Cognitive:  {BHH COGNITIVE:22267}  Insight: {BHH Insight2:20797}  Engagement in Group:  {BHH ENGAGEMENT IN HMNLE:77731}  Modes of Intervention:  {BHH MODES OF INTERVENTION:22269}  Additional Comments:  ***  Felicia Woods 02/15/2024, 2:22 PM

## 2024-02-15 NOTE — ED Notes (Signed)
 Hospitalist at bedside

## 2024-02-15 NOTE — ED Notes (Signed)
 Per [place], PA with cardiology wean off of nitroglycerin  gtt and treat pain with PRN meds.

## 2024-02-15 NOTE — ED Provider Notes (Signed)
 [facility] Provider Note    Event Date/Time   First MD Initiated Contact with Patient 02/15/24 [phone]     (approximate)   History   Chest Pain   HPI  Felicia Woods is a 68 y.o. female who presents to the ED for evaluation of Chest Pain   I review left heart cath from yesterday.  Severe multivessel disease.  History of CABG x 4 with multiple stents.  COPD, obesity, diastolic dysfunction but normal EF.  Shortly after getting home yesterday evening she reports recurrence of severe pain.   Physical Exam   Triage Vital Signs: ED Triage Vitals  Encounter Vitals Group     BP 02/15/24 0213 (!) 148/76     Girls Systolic BP Percentile --      Girls Diastolic BP Percentile --      Boys Systolic BP Percentile --      Boys Diastolic BP Percentile --      Pulse Rate 02/15/24 0213 62     Resp 02/15/24 0213 20     Temp 02/15/24 0213 98.8 F (37.1 C)     Temp Source 02/15/24 0213 Oral     SpO2 02/15/24 0213 100 %     Weight 02/15/24 0210 236 lb 8 oz (107.3 kg)     Height 02/15/24 0210 5' 7 (1.702 m)     Head Circumference --      Peak Flow --      Pain Score --      Pain Loc --      Pain Education --      Exclude from Growth Chart --     Most recent vital signs: Vitals:   02/15/24 0530 02/15/24 0542  BP: 109/73 108/73  Pulse: (!) 54 (!) 51  Resp: 15 16  Temp:    SpO2: 98% 96%    General: Awake, no distress.  CV:  Good peripheral perfusion.  Resp:  Normal effort.  Abd:  No distention.  MSK:  No deformity noted.  Neuro:  No focal deficits appreciated. Other:     ED Results / Procedures / Treatments   Labs (all labs ordered are listed, but only abnormal results are displayed) Labs Reviewed  BASIC METABOLIC PANEL WITH GFR - Abnormal; Notable for the following components:      Result Value   Sodium 134 (*)    CO2 21 (*)    Glucose, Bld 149 (*)    BUN 26 (*)    All other components within normal limits  CBC - Abnormal; Notable for the  following components:   MCH 25.6 (*)    RDW 16.4 (*)    All other components within normal limits  TROPONIN I (HIGH SENSITIVITY) - Abnormal; Notable for the following components:   Troponin I (High Sensitivity) 818 (*)    All other components within normal limits  TROPONIN I (HIGH SENSITIVITY) - Abnormal; Notable for the following components:   Troponin I (High Sensitivity) 1,181 (*)    All other components within normal limits  HEPARIN  LEVEL (UNFRACTIONATED)    EKG Sinus rhythm with a rate of 51 bpm.  Leftward axis, right bundle, no STEMI.  Similar morphology as previous EKGs.  RADIOLOGY CXR interpreted by me without evidence of acute cardiopulmonary pathology.  Official radiology report(s): DG Chest 2 View Result Date: 02/15/2024 CLINICAL DATA:  Chest pain EXAM: CHEST - 2 VIEW COMPARISON:  02/09/2024 FINDINGS: Cardiac shadow is enlarged but stable. Postsurgical changes are again seen.  The lungs are well aerated bilaterally. Basilar atelectasis is noted bilaterally. No focal infiltrate or effusion is seen. IMPRESSION: Bibasilar atelectatic changes. Electronically Signed   By: Oneil Devonshire M.D.   On: 02/15/2024 02:34   CARDIAC CATHETERIZATION Result Date: 02/14/2024   Ost Cx to Prox Cx lesion is 85% stenosed.   Prox Cx to Mid Cx lesion is 100% stenosed.   Prox LAD lesion is 70% stenosed.   Prox LAD to Mid LAD lesion is 90% stenosed.   Ost RCA to Prox RCA lesion is 100% stenosed.   Origin lesion is 100% stenosed.   Mid Graft lesion is 100% stenosed.   Dist LAD lesion is 80% stenosed.   The left ventricular systolic function is normal.   LV end diastolic pressure is normal.   The left ventricular ejection fraction is 50-55% by visual estimate. 1.  Severe three-vessel coronary artery disease with 90% stenosis proximal/mid LAD, 85% stenosis ostial/proximal left circumflex, 100% stenosis proximal/mid left circumflex, 100% stenosis ostial RCA. Ipsi-collaterals from OM1-OM2/OM 3, contra collaterals  from LAD/septal perforators to distal RCA 2.  Chronically occluded SVG to distal RCA, occluded SVG to OM 2, patent LIMA to distal LAD 3.  Normal left ventricular function Recommendations 1.  Medical therapy 2.  Maximize medical therapy 3.  Aggressive risk factor modification    PROCEDURES and INTERVENTIONS:  .Critical Care  Performed by: Claudene Rover, MD Authorized by: Claudene Rover, MD   Critical care provider statement:    Critical care time (minutes):  30   Critical care time was exclusive of:  Separately billable procedures and treating other patients   Critical care was necessary to treat or prevent imminent or life-threatening deterioration of the following conditions:  Cardiac failure and circulatory failure   Critical care was time spent personally by me on the following activities:  Development of treatment plan with patient or surrogate, discussions with consultants, evaluation of patient's response to treatment, examination of patient, ordering and review of laboratory studies, ordering and review of radiographic studies, ordering and performing treatments and interventions, pulse oximetry, re-evaluation of patient's condition and review of old charts .1-3 Lead EKG Interpretation  Performed by: Claudene Rover, MD Authorized by: Claudene Rover, MD     Interpretation: normal     ECG rate:  58   ECG rate assessment: bradycardic     Rhythm: sinus bradycardia     Ectopy: none     Conduction: normal     Medications  heparin  ADULT infusion 100 units/mL (25000 units/250mL) (1,200 Units/hr Intravenous New Bag/Given 02/15/24 0336)  nitroGLYCERIN  50 mg in dextrose  5 % 250 mL (0.2 mg/mL) infusion (250 mcg/min Intravenous Rate/Dose Change 02/15/24 0438)  heparin  bolus via infusion 4,000 Units (4,000 Units Intravenous Bolus from Bag 02/15/24 0338)     IMPRESSION / MDM / ASSESSMENT AND PLAN / ED COURSE  I reviewed the triage vital signs and the nursing notes.  Differential diagnosis includes,  but is not limited to, ACS, PTX, PNA, muscle strain/spasm, PE, dissection, anxiety, pleural effusion  {Patient presents with symptoms of an acute illness or injury that is potentially life-threatening.  Patient with severe multivessel CAD presents with worsening chest pain, worsening troponins and NSTEMI requiring heparinization, nitroglycerin  drip and readmission.  EKG remains without STEMI.  I consult cardiology recommends continued medical management and they will see this morning.  May require transfer.  Normal CBC and metabolic panel.  Clear CXR.  Clinical Course as of 02/15/24 0616  Wed Feb 15, 2024  0448 reassessed [DS]  [id number] Consult with Dr. Ammon. Continue meds, they'll see in consultation this AM. Might need to transfer to [facility].  [DS]    Clinical Course User Index [DS] Claudene Rover, MD     FINAL CLINICAL IMPRESSION(S) / ED DIAGNOSES   Final diagnoses:  NSTEMI (non-ST elevated myocardial infarction) [facility]     Rx / DC Orders   ED Discharge Orders     None        Note:  This document was prepared using Dragon voice recognition software and may include unintentional dictation errors.   Claudene Rover, MD 02/15/24 [phone]

## 2024-02-15 NOTE — Discharge Summary (Signed)
 Physician Discharge Summary   Patient: Felicia Woods MRN: [id number] DOB: 06-30-1956  Admit date:     02/09/2024  Discharge date: 02/14/2024  Discharge Physician: Leita Blanch   PCP: Kristina Tinnie POUR, PA-C   Recommendations at discharge:   follow-up Dr. Florencio in 1 to 2 week  Discharge Diagnoses: Principal Problem:   NSTEMI (non-ST elevated myocardial infarction) [facility]) Active Problems:   Essential hypertension   Dyslipidemia   Depression   GERD without esophagitis   Asthma, chronic   Assessment and Plan Felicia Woods is a 68 y.o. female with medical history significant for coronary artery disease status post PCIs and 7 stents, type 2 diabetes mellitus, dyslipidemia, hypertension, asthma and migraine, who presented to the emergency room with acute onset of left upper chest pain with radiation to her left shoulder and associated numbness in the left shoulder and arm.    NSTEMI (non-ST elevated myocardial infarction) (HCC) Chronic Stable Angina - continue her on IV heparin  for 48 hours--completed -- on aspirin  and Plavix  and as needed sublingual nitroglycerin  and morphine  sulfate for pain. - resumed Ranexa  and Imdur  (pt was not taking) -  Toprol -XL, Lipitor . --decreased losartan  to 50 mg (creat up) -- Cardiology consultation with [facility]-- recommends conservative management for now. - troponins 178--192--164--169 --cont with some CP--asking for morphine  -- [facility] cardiology  heart catheterization-->1.  Severe three-vessel coronary artery disease with 90% stenosis proximal/mid LAD, 85% stenosis ostial/proximal left circumflex, 100% stenosis proximal/mid left circumflex, 100% stenosis ostial RCA. Ipsi-collaterals from OM1-OM2/OM 3, contra collaterals from LAD/septal perforators to distal RCA 2.  Chronically occluded SVG to distal RCA, chronically occluded SVG to D1, occluded SVG to OM 2, patent LIMA to distal LAD 3.  Normal left ventricular function -- optimize medical  management.  Recommendations  1.  Medical therapy 2.  Maximize medical therapy 3.  Aggressive risk factor modification   Essential hypertension -  continue antihypertensive therapy.   Dyslipidemia -  continue statin therapy    Asthma, chronic - continue her inhalers.   GERD without esophagitis - on H2 blocker therapy.   Depression - continue Lexapro .   Will discharge patient to home.     Procedures: cardiac cath Family communication : none today consults : [facility] cardiology CODE STATUS: full DVT Prophylaxis : lovenoxLevel of care: Progressive    Pain control - [place]  Controlled Substance Reporting System database was reviewed. and patient was instructed, not to drive, operate heavy machinery, perform activities at heights, swimming or participation in water  activities or provide baby-sitting services while on Pain, Sleep and Anxiety Medications; until their outpatient Physician has advised to do so again. Also recommended to not to take more than prescribed Pain, Sleep and Anxiety Medications.    Disposition: Home Diet recommendation:  Cardiac diet DISCHARGE MEDICATION: Allergies as of 02/14/2024       Reactions   Penicillin G Anaphylaxis, Other (See Comments)   Has patient had a PCN reaction causing immediate rash, facial/tongue/throat swelling, SOB or lightheadedness with hypotension: bzd:[id number]} Has patient had a PCN reaction causing severe rash involving mucus membranes or skin necrosis: no:[id number]} Has patient had a PCN reaction that required hospitalization no:[id number]} Has patient had a PCN reaction occurring within the last 10 years: no:[id number]} If all of the above answers are NO, then may proceed with Cephalosporin use.   Strawberry (diagnostic)         Medication List     TAKE these medications    Adjust Bath/Shower Seat Misc 1 shower seat for  use at home to reduce risk of falling.   aspirin  EC 81 MG tablet Take 81 mg by mouth daily.    atorvastatin  40 MG tablet Commonly known as: LIPITOR  Take 2 tablets (80 mg total) by mouth daily. What changed: how much to take   Breztri  Aerosphere 160-9-4.8 MCG/ACT Aero inhaler Generic drug: budesonide -glycopyrrolate -formoterol  Inhale 2 puffs into the lungs 2 (two) times daily.   clopidogrel  75 MG tablet Commonly known as: PLAVIX  Take 1 tablet (75 mg total) by mouth daily.   empagliflozin  25 MG Tabs tablet Commonly known as: Jardiance  Take one tab a day for diabetes   escitalopram  10 MG tablet Commonly known as: Lexapro  Take 1 tablet (10 mg total) by mouth daily.   famotidine  20 MG tablet Commonly known as: PEPCID  Take 1 tablet (20 mg total) by mouth 2 (two) times daily.   furosemide  40 MG tablet Commonly known as: LASIX  Take 1 tablet (40 mg total) by mouth daily. What changed: when to take this   hydrALAZINE  10 MG tablet Commonly known as: APRESOLINE  Take 1 tablet (10 mg total) by mouth 3 (three) times daily.   isosorbide  mononitrate 120 MG 24 hr tablet Commonly known as: IMDUR  Take 1 tablet (120 mg total) by mouth 2 (two) times daily. What changed: when to take this   losartan  100 MG tablet Commonly known as: COZAAR  Take 0.5 tablets (50 mg total) by mouth daily. What changed: how much to take   methocarbamol  500 MG tablet Commonly known as: ROBAXIN  Take 500 mg by mouth 3 (three) times daily as needed for muscle spasms.   metoprolol  succinate 100 MG 24 hr tablet Commonly known as: TOPROL -XL Take 1 tablet (100 mg total) by mouth daily.   montelukast  10 MG tablet Commonly known as: SINGULAIR  Take 1 tablet (10 mg total) by mouth at bedtime.   nitroGLYCERIN  0.4 MG SL tablet Commonly known as: NITROSTAT  DISSOLVE ONE TABLET UNDER THE TONGUE EVERY 5 MINUTES AS NEEDED FOR CHEST PAIN.  DO NOT EXCEED A TOTAL OF 3 DOSES IN 15 MINUTES What changed: Another medication with the same name was removed. Continue taking this medication, and follow the directions you  see here.   OneTouch Delica Lancets 30G Misc USE 1  TO CHECK GLUCOSE TWICE DAILY AS DIRECTED   OneTouch Verio Flex System w/Device Kit Use as directed twice a daily DX E11.65   OneTouch Verio test strip Generic drug: glucose blood Use as instructed twice daily DX E11.65   oxybutynin  5 MG tablet Commonly known as: DITROPAN  Take 1 tablet (5 mg total) by mouth daily.   ProAir  RespiClick 108 (90 Base) MCG/ACT Aepb Generic drug: Albuterol  Sulfate Inhale 2 puffs into the lungs every 6 (six) hours as needed.   ranolazine  1000 MG SR tablet Commonly known as: RANEXA  Take 1 tablet (1,000 mg total) by mouth 2 (two) times daily.   tirzepatide  2.5 MG/0.5ML Pen Commonly known as: MOUNJARO  Inject 2.5 mg into the skin once a week.   traMADol  50 MG tablet Commonly known as: ULTRAM  Take 50 mg by mouth every 6 (six) hours as needed. for pain   zolpidem  5 MG tablet Commonly known as: AMBIEN  Take 1 tablet (5 mg total) by mouth at bedtime as needed. for sleep        Follow-up Information     Florencio Cara BIRCH, MD. Go in 1 week(s).   Specialties: Cardiology, Internal Medicine Contact information: [address] [place] [address] [phone]  McDonough, Tinnie POUR, PA-C. Schedule an appointment as soon as possible for a visit in 1 week(s).   Specialty: Physician Assistant Contact information: [phone] [address] [place] [address] [phone]                Discharge Exam: Filed Weights   02/09/24 2153 02/14/24 0556  Weight: 101.2 kg 107.3 kg   GENERAL:  68 y.o.-year-old patient with no acute distress. obese LUNGS: Normal breath sounds bilaterally, no wheezing CARDIOVASCULAR: S1, S2 normal. No murmur  pain on palpation ABDOMEN: Soft, nontender, nondistended.  EXTREMITIES: No  edema b/l.    NEUROLOGIC: nonfocal  patient is alert and awake   Condition at discharge: fair  The results of significant diagnostics from this hospitalization (including  imaging, microbiology, ancillary and laboratory) are listed below for reference.   Imaging Studies: CARDIAC CATHETERIZATION Addendum Date: 02/15/2024   Lida Cx to Prox Cx lesion is 85% stenosed.   Prox Cx to Mid Cx lesion is 100% stenosed.   Prox LAD lesion is 70% stenosed.   Prox LAD to Mid LAD lesion is 90% stenosed.   Ost RCA to Prox RCA lesion is 100% stenosed.   Origin lesion is 100% stenosed.   Mid Graft lesion is 100% stenosed.   Dist LAD lesion is 80% stenosed.   Origin to Prox Graft lesion is 100% stenosed.   The left ventricular systolic function is normal.   LV end diastolic pressure is normal.   The left ventricular ejection fraction is 50-55% by visual estimate. 1.  Severe three-vessel coronary artery disease with 90% stenosis proximal/mid LAD, 85% stenosis ostial/proximal left circumflex, 100% stenosis proximal/mid left circumflex, 100% stenosis ostial RCA. Ipsi-collaterals from OM1-OM2/OM 3, contra collaterals from LAD/septal perforators to distal RCA 2.  Chronically occluded SVG to distal RCA, chronically occluded SVG to D1, occluded SVG to OM 2, patent LIMA to distal LAD 3.  Normal left ventricular function Recommendations 1.  Medical therapy 2.  Maximize medical therapy 3.  Aggressive risk factor modification  Result Date: 02/15/2024   Ost Cx to Prox Cx lesion is 85% stenosed.   Prox Cx to Mid Cx lesion is 100% stenosed.   Prox LAD lesion is 70% stenosed.   Prox LAD to Mid LAD lesion is 90% stenosed.   Ost RCA to Prox RCA lesion is 100% stenosed.   Origin lesion is 100% stenosed.   Mid Graft lesion is 100% stenosed.   Dist LAD lesion is 80% stenosed.   The left ventricular systolic function is normal.   LV end diastolic pressure is normal.   The left ventricular ejection fraction is 50-55% by visual estimate. 1.  Severe three-vessel coronary artery disease with 90% stenosis proximal/mid LAD, 85% stenosis ostial/proximal left circumflex, 100% stenosis proximal/mid left circumflex, 100% stenosis  ostial RCA. Ipsi-collaterals from OM1-OM2/OM 3, contra collaterals from LAD/septal perforators to distal RCA 2.  Chronically occluded SVG to distal RCA, occluded SVG to OM 2, patent LIMA to distal LAD 3.  Normal left ventricular function Recommendations 1.  Medical therapy 2.  Maximize medical therapy 3.  Aggressive risk factor modification   DG Chest 2 View Result Date: 02/15/2024 CLINICAL DATA:  Chest pain EXAM: CHEST - 2 VIEW COMPARISON:  02/09/2024 FINDINGS: Cardiac shadow is enlarged but stable. Postsurgical changes are again seen. The lungs are well aerated bilaterally. Basilar atelectasis is noted bilaterally. No focal infiltrate or effusion is seen. IMPRESSION: Bibasilar atelectatic changes. Electronically Signed   By: Oneil Devonshire M.D.   On:  02/15/2024 02:34   ECHOCARDIOGRAM COMPLETE Result Date: 02/11/2024    ECHOCARDIOGRAM REPORT   Patient Name:   Felicia Woods Date of Exam: 02/10/2024 Medical Rec #:  [id number]      Height:       67.0 in Accession #:    [id number]     Weight:       223.0 lb Date of Birth:  January 05, 1956     BSA:          2.118 m Patient Age:    67 years       BP:           102/84 mmHg Patient Gender: F              HR:           61 bpm. Exam Location:  [facility] Procedure: 2D Echo, Cardiac Doppler and Color Doppler (Both Spectral and Color            Flow Doppler were utilized during procedure). Indications:     Chest Pain R07.9  History:         Patient has prior history of Echocardiogram examinations, most                  recent 04/07/2022. CHF, Previous Myocardial Infarction, Acute                  MI, CAD and Angina, Prior CABG, Signs/Symptoms:Shortness of                  Breath; Risk Factors:Diabetes and Dyslipidemia.  Sonographer:     Thea Norlander RCS Referring Phys:  [id number] DORENE COMFORT Diagnosing Phys: Cara JONETTA Lovelace MD IMPRESSIONS  1. Left ventricular ejection fraction, by estimation, is 55 to 60%. The left ventricle has normal function. The left ventricle has no  regional wall motion abnormalities. Left ventricular diastolic parameters were normal.  2. Right ventricular systolic function is normal. The right ventricular size is normal.  3. The mitral valve is normal in structure. Trivial mitral valve regurgitation.  4. The aortic valve is normal in structure. Aortic valve regurgitation is not visualized. FINDINGS  Left Ventricle: Left ventricular ejection fraction, by estimation, is 55 to 60%. The left ventricle has normal function. The left ventricle has no regional wall motion abnormalities. Strain was performed and the global longitudinal strain is indeterminate. The left ventricular internal cavity size was normal in size. There is no left ventricular hypertrophy. Left ventricular diastolic parameters were normal. Right Ventricle: The right ventricular size is normal. No increase in right ventricular wall thickness. Right ventricular systolic function is normal. Left Atrium: Left atrial size was normal in size. Right Atrium: Right atrial size was normal in size. Pericardium: There is no evidence of pericardial effusion. Mitral Valve: The mitral valve is normal in structure. Trivial mitral valve regurgitation. Tricuspid Valve: The tricuspid valve is normal in structure. Tricuspid valve regurgitation is not demonstrated. Aortic Valve: The aortic valve is normal in structure. Aortic valve regurgitation is not visualized. Aortic valve peak gradient measures 4.2 mmHg. Pulmonic Valve: The pulmonic valve was normal in structure. Pulmonic valve regurgitation is not visualized. Aorta: The ascending aorta was not well visualized. IAS/Shunts: No atrial level shunt detected by color flow Doppler. Additional Comments: 3D was performed not requiring image post processing on an independent workstation and was indeterminate.  LEFT VENTRICLE PLAX 2D LVIDd:         3.80 cm   Diastology LVIDs:  2.70 cm   LV e' medial:    4.24 cm/s LV PW:         1.05 cm   LV E/e' medial:  15.4 LV  IVS:        0.80 cm   LV e' lateral:   7.72 cm/s LVOT diam:     1.90 cm   LV E/e' lateral: 8.5 LV SV:         37 LV SV Index:   17 LVOT Area:     2.84 cm  RIGHT VENTRICLE            IVC RV S prime:     4.20 cm/s  IVC diam: 1.60 cm LEFT ATRIUM         Index LA diam:    3.55 cm 1.68 cm/m  AORTIC VALVE AV Area (Vmax): 2.20 cm AV Vmax:        102.00 cm/s AV Peak Grad:   4.2 mmHg LVOT Vmax:      79.10 cm/s LVOT Vmean:     49.800 cm/s LVOT VTI:       0.129 m  AORTA Ao Root diam: 2.90 cm Ao Asc diam:  3.00 cm MITRAL VALVE MV Area (PHT): 3.99 cm    SHUNTS MV Decel Time: 190 msec    Systemic VTI:  0.13 m MV E velocity: 65.50 cm/s  Systemic Diam: 1.90 cm MV A velocity: 56.60 cm/s MV E/A ratio:  1.16 Dwayne JONETTA Lovelace MD Electronically signed by Cara JONETTA Lovelace MD Signature Date/Time: 02/11/2024/11:13:05 AM    Final    DG Chest Port 1 View Result Date: 02/09/2024 CLINICAL DATA:  Chest pain EXAM: PORTABLE CHEST 1 VIEW COMPARISON:  04/06/2022 FINDINGS: Stable cardiomediastinal silhouette. Aortic atherosclerotic calcification. Sternotomy. Bibasilar airspace opacities favor atelectasis. No pleural effusion or pneumothorax. IMPRESSION: Bibasilar airspace opacities favor atelectasis. Electronically Signed   By: Norman Gatlin M.D.   On: 02/09/2024 22:14    Microbiology: Results for orders placed or performed in visit on 11/24/23  CULTURE, URINE COMPREHENSIVE     Status: None   Collection Time: 11/24/23  4:19 PM   Specimen: Urine   Urine  Result Value Ref Range Status   Urine Culture, Comprehensive Final report  Final   Organism ID, Bacteria Comment  Final    Comment: Mixed urogenital flora 10,000-25,000 colony forming units per mL     Labs: CBC: Recent Labs  Lab 02/09/24 2201 02/11/24 0402 02/12/24 0854 02/14/24 0400 02/15/24 0214  WBC 9.9 8.1 11.5* 8.1 8.8  NEUTROABS 6.5  --   --   --   --   HGB 12.1 11.4* 12.0 11.7* 12.5  HCT 37.0 35.9* 36.6 36.7 39.4  MCV 79.7* 82.5 80.3 81.9 80.7  PLT 245  218 268 240 283   Basic Metabolic Panel: Recent Labs  Lab 02/09/24 2201 02/11/24 0402 02/12/24 0854 02/14/24 0400 02/15/24 0214  NA 136 136 135 136 134*  K 3.5 4.1 4.5 4.3 4.2  CL 103 101 98 101 101  CO2 24 26 23 24  21*  GLUCOSE 144* 171* 122* 113* 149*  BUN 19 23 18  33* 26*  CREATININE 0.70 1.28* 0.81 1.15* 0.92  CALCIUM  9.0 9.1 9.4 9.4 9.6   Liver Function Tests: Recent Labs  Lab 02/09/24 2201  AST 22  ALT 15  ALKPHOS 60  BILITOT 0.6  PROT 7.3  ALBUMIN 3.4*   CBG: Recent Labs  Lab 02/12/24 0359 02/14/24 1216 02/14/24 1441 02/15/24 1247  GLUCAP 199* 112*  100* 153*    Discharge time spent: greater than 30 minutes.  Signed: Leita Blanch, MD [facility] 02/15/2024

## 2024-02-15 NOTE — ED Triage Notes (Signed)
 Pt to ED via EMS from home, pt reports chest pain that began earlier tonight, pt reports she was laying down when it started. Pt reports she was recently admitted here from 8/7-8/12 for an NSTEMI. Pt took 324 asa and 1 SL nitroglycerin  PTA. Pt reports pain as pressure

## 2024-02-16 ENCOUNTER — Other Ambulatory Visit (HOSPITAL_COMMUNITY): Payer: Self-pay

## 2024-02-16 DIAGNOSIS — I214 Non-ST elevation (NSTEMI) myocardial infarction: Secondary | ICD-10-CM | POA: Diagnosis not present

## 2024-02-16 DIAGNOSIS — R072 Precordial pain: Secondary | ICD-10-CM

## 2024-02-16 LAB — GLUCOSE, CAPILLARY
Glucose-Capillary: 118 mg/dL — ABNORMAL HIGH (ref 70–99)
Glucose-Capillary: 199 mg/dL — ABNORMAL HIGH (ref 70–99)

## 2024-02-16 LAB — BASIC METABOLIC PANEL WITH GFR
Anion gap: 9 (ref 5–15)
BUN: 21 mg/dL (ref 8–23)
CO2: 25 mmol/L (ref 22–32)
Calcium: 9 mg/dL (ref 8.9–10.3)
Chloride: 101 mmol/L (ref 98–111)
Creatinine, Ser: 0.85 mg/dL (ref 0.44–1.00)
GFR, Estimated: >60 mL/min (ref >60)
Glucose, Bld: 114 mg/dL — ABNORMAL HIGH (ref 70–99)
Potassium: 3.9 mmol/L (ref 3.5–5.1)
Sodium: 135 mmol/L (ref 135–145)

## 2024-02-16 LAB — CBC
HCT: 36.9 % (ref 36.0–46.0)
Hemoglobin: 11.9 g/dL — ABNORMAL LOW (ref 12.0–15.0)
MCH: 26.2 pg (ref 26.0–34.0)
MCHC: 32.2 g/dL (ref 30.0–36.0)
MCV: 81.3 fL (ref 80.0–100.0)
Platelets: 242 K/uL (ref 150–400)
RBC: 4.54 MIL/uL (ref 3.87–5.11)
RDW: 16.7 % — ABNORMAL HIGH (ref 11.5–15.5)
WBC: 9 K/uL (ref 4.0–10.5)
nRBC: 0 % (ref 0.0–0.2)

## 2024-02-16 LAB — CBG MONITORING, ED
Glucose-Capillary: 110 mg/dL — ABNORMAL HIGH (ref 70–99)
Glucose-Capillary: 183 mg/dL — ABNORMAL HIGH (ref 70–99)

## 2024-02-16 LAB — HEPARIN LEVEL (UNFRACTIONATED): Heparin Unfractionated: 0.55 [IU]/mL (ref 0.30–0.70)

## 2024-02-16 MED ORDER — ZOLPIDEM TARTRATE 5 MG PO TABS
5.0000 mg | ORAL_TABLET | Freq: Once | ORAL | Status: AC
  Administered 2024-02-16: 5 mg via ORAL
  Filled 2024-02-16: qty 1

## 2024-02-16 NOTE — ED Notes (Signed)
 Please have pt to call her husband

## 2024-02-16 NOTE — Care Management Obs Status (Signed)
 MEDICARE OBSERVATION STATUS NOTIFICATION   Patient Details  Name: Felicia Woods MRN: [id number] Date of Birth: Jul 10, 1955   Medicare Observation Status Notification Given:  Yes    Devorah Givhan W, CMA 02/16/2024, 2:07 PM

## 2024-02-16 NOTE — Progress Notes (Signed)
 PHARMACY - ANTICOAGULATION CONSULT NOTE  Pharmacy Consult for Heparin   Indication: chest pain/ACS  Allergies  Allergen Reactions   Penicillin G Anaphylaxis and Other (See Comments)    Has patient had a PCN reaction causing immediate rash, facial/tongue/throat swelling, SOB or lightheadedness with hypotension: bzd:[id number]} Has patient had a PCN reaction causing severe rash involving mucus membranes or skin necrosis: no:[id number]} Has patient had a PCN reaction that required hospitalization no:[id number]} Has patient had a PCN reaction occurring within the last 10 years: no:[id number]} If all of the above answers are NO, then may proceed with Cephalosporin use.   Strawberry (Diagnostic)     Patient Measurements: Height: 5' 7 (170.2 cm) Weight: 107.3 kg (236 lb 8 oz) IBW/kg (Calculated) : 61.6 HEPARIN  DW (KG): 86.1  Vital Signs: Temp: 98.3 F (36.8 C) (08/14 0635) Temp Source: Oral (08/14 0635) BP: 100/65 (08/14 0700) Pulse Rate: 67 (08/14 0700)  Labs: Recent Labs    02/14/24 0400 02/15/24 0214 02/15/24 0435 02/15/24 1007 02/15/24 1600 02/16/24 0636  HGB 11.7* 12.5  --   --   --  11.9*  HCT 36.7 39.4  --   --   --  36.9  PLT 240 283  --   --   --  242  HEPARINUNFRC  --   --   --  0.56 0.58 0.55  CREATININE 1.15* 0.92  --   --   --  0.85  TROPONINIHS  --  818* 1,181*  --   --   --     Estimated Creatinine Clearance: 82.1 mL/min (by C-G formula based on SCr of 0.85 mg/dL).   Medical History: Past Medical History:  Diagnosis Date   Asthma    Coronary artery disease    Diabetes mellitus without complication (HCC)    Heart attack (HCC)    Hyperlipidemia    Hypertension    MI (myocardial infarction) (HCC)    Migraine headache with aura    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    Medications:  (Not in a hospital admission)   Assessment: Pharmacy consulted to dose heparin  in this 68 year old female admitted with ACS/NSTEMI.   No prior anticoag noted.  CrCl = 75.9  ml/min   Goal of Therapy:  Heparin  level 0.3-0.7 units/ml Monitor platelets by anticoagulation protocol: Yes  8/13@1007 : HL 0.56, therapeutic x 1 8/13@1600 : HL 0.58, therapeutic x 2 8/14@0636 : HL 0.55, therapeutic    Plan:  Heparin  level remains therapeutic  Continue heparin  infusion at 1200 units/hr Recheck heparin  level tomorrow with morning labs Continue to monitor H&H and platelets  Estill CHRISTELLA Lutes, PharmD, BCPS Clinical Pharmacist 02/16/2024 7:28 AM

## 2024-02-16 NOTE — Progress Notes (Signed)
 Pt asking is for her home dose ambien , notified Erminio NP, order given. Plan of care continued.

## 2024-02-16 NOTE — Progress Notes (Signed)
 Progress Note    Felicia Woods  FMW:[id number] DOB: [birth year]  DOA: 02/15/2024 PCP: Kristina Tinnie POUR, PA-C      Brief Narrative:    Medical records reviewed and are as summarized below:  Felicia Woods is a 68 y.o. female  with medical history significant for CAD s/p CABG x 4 with multiple stents, COPD, obesity, diastolic dysfunction with normal ejection fraction who was admitted to [facility] for chest pain and was discharged on 02/14/2024 after being treated for ACS, s/p left heart catheterization and advised for medical management.  Patient started having chest pain when she reached home, which was severe 8-9/10, retrosternal, nonradiating, not associated with nausea or vomiting.    ED Course: Upon arrival to the ED, patient is found to have severe chest pain with elevated troponin at 818 and 1181.  She was started on heparin  drip and hospitalist service was consulted for evaluation for admission.     Assessment/Plan:   Principal Problem:   Chest pain Active Problems:   Essential hypertension   Dyslipidemia   STEMI 10/18/15   Hx of CABG 2010   Diabetes mellitus (HCC)   Morbid obesity (HCC)   Body mass index is 37.04 kg/m.  (Class II obesity)   Chronic angina, chest pain, CAD s/p CABG x 4, multiple stents, elevated troponins: Analgesics as needed for pain.  Continue IV heparin  drip. Continue Ranexa , Imdur , metoprolol , aspirin , Plavix  and Lipitor .  Recent left heart cath report on 02/14/2024:  1.  Severe three-vessel coronary artery disease with 90% stenosis proximal/mid LAD, 85% stenosis ostial/proximal left circumflex, 100% stenosis proximal/mid left circumflex, 100% stenosis ostial RCA. Ipsi-collaterals from OM1-OM2/OM 3, contra collaterals from LAD/septal perforators to distal RCA 2.  Chronically occluded SVG to distal RCA, chronically occluded SVG to D1, occluded SVG to OM 2, patent LIMA to distal LAD 3.  Normal left ventricular  function   Recommendations   1.  Medical therapy 2.  Maximize medical therapy 3.  Aggressive risk factor modification   Hypertension: Continue antihypertensives Chronic diastolic CHF: Been treated.  Continue antihypertensives continue Lasix    Type II DM: Continue Jardiance  and NovoLog  as needed for hyperglycemia   Hyperlipidemia: Continue Lipitor       Diet Order             Diet Heart Room service appropriate? Yes; Fluid consistency: Thin  Diet effective now                                  Consultants: Cardiologist  Procedures: None    Medications:    aspirin  EC  81 mg Oral Daily   atorvastatin   80 mg Oral Daily   clopidogrel   75 mg Oral Daily   empagliflozin   25 mg Oral Daily   famotidine   20 mg Oral BID   furosemide   40 mg Oral Daily   hydrALAZINE   10 mg Oral TID   insulin  aspart  0-5 Units Subcutaneous QHS   insulin  aspart  0-9 Units Subcutaneous TID WC   isosorbide  mononitrate  120 mg Oral BID   losartan   50 mg Oral Daily   metoprolol  succinate  100 mg Oral Daily   ranolazine   1,000 mg Oral BID   Continuous Infusions:  heparin  1,200 Units/hr (02/16/24 0827)   nitroGLYCERIN  Stopped (02/15/24 1958)     Anti-infectives (From admission, onward)    None  Family Communication/Anticipated D/C date and plan/Code Status   DVT prophylaxis: SCDs Start: 02/15/24 0924     Code Status: Full Code  Family Communication: None Disposition Plan: Plan to discharge home   Status is: Observation The patient will require care spanning > 2 midnights and should be moved to inpatient because: Angina, CAD on IV heparin  drip       Subjective:   Interval events noted.  Chest pain is better after she got morphine  this morning.  No shortness of breath.  Objective:    Vitals:   02/15/24 2354 02/16/24 0317 02/16/24 0635 02/16/24 0700  BP: 111/70 101/68  100/65  Pulse: (!) 54 61  67  Resp: 14 11  15   Temp:    98.3 F (36.8 C)   TempSrc:   Oral   SpO2: 92% 96%  93%  Weight:      Height:       No data found.   Intake/Output Summary (Last 24 hours) at 02/16/2024 0914 Last data filed at 02/16/2024 0827 Gross per 24 hour  Intake 371.08 ml  Output --  Net 371.08 ml   Filed Weights   02/15/24 0210  Weight: 107.3 kg    Exam:  GEN: NAD SKIN: Warm and dry EYES: No pallor or icterus ENT: MMM CV: RRR PULM: CTA B ABD: soft, ND, NT, +BS CNS: AAO x 3, non focal EXT: No edema or tenderness        Data Reviewed:   I have personally reviewed following labs and imaging studies:  Labs: Labs show the following:   Basic Metabolic Panel: Recent Labs  Lab 02/11/24 0402 02/12/24 0854 02/14/24 0400 02/15/24 0214 02/16/24 0636  NA 136 135 136 134* 135  K 4.1 4.5 4.3 4.2 3.9  CL 101 98 101 101 101  CO2 26 23 24  21* 25  GLUCOSE 171* 122* 113* 149* 114*  BUN 23 18 33* 26* 21  CREATININE 1.28* 0.81 1.15* 0.92 0.85  CALCIUM  9.1 9.4 9.4 9.6 9.0   GFR Estimated Creatinine Clearance: 82.1 mL/min (by C-G formula based on SCr of 0.85 mg/dL). Liver Function Tests: Recent Labs  Lab 02/09/24 2201  AST 22  ALT 15  ALKPHOS 60  BILITOT 0.6  PROT 7.3  ALBUMIN 3.4*   No results for input(s): LIPASE, AMYLASE in the last 168 hours. No results for input(s): AMMONIA in the last 168 hours. Coagulation profile Recent Labs  Lab 02/09/24 2210  INR 1.1    CBC: Recent Labs  Lab 02/09/24 2201 02/11/24 0402 02/12/24 0854 02/14/24 0400 02/15/24 0214 02/16/24 0636  WBC 9.9 8.1 11.5* 8.1 8.8 9.0  NEUTROABS 6.5  --   --   --   --   --   HGB 12.1 11.4* 12.0 11.7* 12.5 11.9*  HCT 37.0 35.9* 36.6 36.7 39.4 36.9  MCV 79.7* 82.5 80.3 81.9 80.7 81.3  PLT 245 218 268 240 283 242   Cardiac Enzymes: No results for input(s): CKTOTAL, CKMB, CKMBINDEX, TROPONINI in the last 168 hours. BNP (last 3 results) No results for input(s): PROBNP in the last 8760 hours. CBG: Recent  Labs  Lab 02/14/24 1441 02/15/24 1247 02/15/24 1737 02/15/24 2135 02/16/24 0747  GLUCAP 100* 153* 105* 121* 110*   D-Dimer: No results for input(s): DDIMER in the last 72 hours. Hgb A1c: No results for input(s): HGBA1C in the last 72 hours. Lipid Profile: No results for input(s): CHOL, HDL, LDLCALC, TRIG, CHOLHDL, LDLDIRECT in the last 72 hours. Thyroid  function studies:  No results for input(s): TSH, T4TOTAL, T3FREE, THYROIDAB in the last 72 hours.  Invalid input(s): FREET3 Anemia work up: No results for input(s): VITAMINB12, FOLATE, FERRITIN, TIBC, IRON, RETICCTPCT in the last 72 hours. Sepsis Labs: Recent Labs  Lab 02/12/24 0854 02/14/24 0400 02/15/24 0214 02/16/24 0636  WBC 11.5* 8.1 8.8 9.0    Microbiology No results found for this or any previous visit (from the past 240 hours).  Procedures and diagnostic studies:  CARDIAC CATHETERIZATION Addendum Date: 02/15/2024   Ost Cx to Prox Cx lesion is 85% stenosed.   Prox Cx to Mid Cx lesion is 100% stenosed.   Prox LAD lesion is 70% stenosed.   Prox LAD to Mid LAD lesion is 90% stenosed.   Ost RCA to Prox RCA lesion is 100% stenosed.   Origin lesion is 100% stenosed.   Mid Graft lesion is 100% stenosed.   Dist LAD lesion is 80% stenosed.   Origin to Prox Graft lesion is 100% stenosed.   The left ventricular systolic function is normal.   LV end diastolic pressure is normal.   The left ventricular ejection fraction is 50-55% by visual estimate. 1.  Severe three-vessel coronary artery disease with 90% stenosis proximal/mid LAD, 85% stenosis ostial/proximal left circumflex, 100% stenosis proximal/mid left circumflex, 100% stenosis ostial RCA. Ipsi-collaterals from OM1-OM2/OM 3, contra collaterals from LAD/septal perforators to distal RCA 2.  Chronically occluded SVG to distal RCA, chronically occluded SVG to D1, occluded SVG to OM 2, patent LIMA to distal LAD 3.  Normal left ventricular function  Recommendations 1.  Medical therapy 2.  Maximize medical therapy 3.  Aggressive risk factor modification  Result Date: 02/15/2024   Ost Cx to Prox Cx lesion is 85% stenosed.   Prox Cx to Mid Cx lesion is 100% stenosed.   Prox LAD lesion is 70% stenosed.   Prox LAD to Mid LAD lesion is 90% stenosed.   Ost RCA to Prox RCA lesion is 100% stenosed.   Origin lesion is 100% stenosed.   Mid Graft lesion is 100% stenosed.   Dist LAD lesion is 80% stenosed.   The left ventricular systolic function is normal.   LV end diastolic pressure is normal.   The left ventricular ejection fraction is 50-55% by visual estimate. 1.  Severe three-vessel coronary artery disease with 90% stenosis proximal/mid LAD, 85% stenosis ostial/proximal left circumflex, 100% stenosis proximal/mid left circumflex, 100% stenosis ostial RCA. Ipsi-collaterals from OM1-OM2/OM 3, contra collaterals from LAD/septal perforators to distal RCA 2.  Chronically occluded SVG to distal RCA, occluded SVG to OM 2, patent LIMA to distal LAD 3.  Normal left ventricular function Recommendations 1.  Medical therapy 2.  Maximize medical therapy 3.  Aggressive risk factor modification   DG Chest 2 View Result Date: 02/15/2024 CLINICAL DATA:  Chest pain EXAM: CHEST - 2 VIEW COMPARISON:  02/09/2024 FINDINGS: Cardiac shadow is enlarged but stable. Postsurgical changes are again seen. The lungs are well aerated bilaterally. Basilar atelectasis is noted bilaterally. No focal infiltrate or effusion is seen. IMPRESSION: Bibasilar atelectatic changes. Electronically Signed   By: Oneil Devonshire M.D.   On: 02/15/2024 02:34               LOS: 0 days   Jamilyn Pigeon  [facility]   Pager on [email or web address]. If 7PM-7AM, please contact night-coverage at [email or web address]     02/16/2024, 9:14 AM

## 2024-02-16 NOTE — Plan of Care (Signed)

## 2024-02-16 NOTE — Progress Notes (Signed)
 [facility] CLINIC CARDIOLOGY PROGRESS NOTE       Patient ID: Felicia Woods MRN: [id number] DOB/AGE: Oct 07, 1955 68 y.o.  Admit date: 02/15/2024 Referring Physician Dr. Roann Primary Physician McDonough, Tinnie POUR, PA-C Primary Cardiologist Dr. Florencio Reason for Consultation chest pain  HPI: Felicia Woods is a 68 y.o. female  with a past medical history of coronary artery disease s/p CABGx4, multiple stents, chronic angina, hypertension, hyperlipidemia, type 2 diabetes mellitus, chronic HFpEF, obesity, asthma, COPD  who presented to the ED on 02/15/2024 for recurrent chest pressure/heaviness. Patient had recent diagnostic LHC on 08/12 that revealed severe 3-vessel CAD with chronically occluded SVG to distal RCA, chronically occluded SVG to D1 and occluded SVG to OM2, contra collaterals from LAD/septal perforators to distal RCA, no intervention performed and optimized medical management. Patient was discharged on 08/12. Patient states at home while in bed she developed severe chest pressure that radiates to left arm associated with diaphoresis and SOB. Patient reported to ED due to recurrent/severe chest pressure. Cardiology was consulted for further evaluation.   Interval History: -Patient seen and examined this AM and laying comfortably in ED stretcher, laying flat. Patient states her chest pain/pressure is much improved s/p IV morphine  and denies SOB.  -Appears euvolemic on exam. -Patients BP and HR stable this AM. Overnight Tele showed no significant events.  -Patient remains on room air with stable SpO2.     Pertinent Cardiac History (Most recent) LHC (08/12) with Dr. Ammon Pais Cx to Prox Cx lesion is 85% stenosed.   Prox Cx to Mid Cx lesion is 100% stenosed.   Prox LAD lesion is 70% stenosed.   Prox LAD to Mid LAD lesion is 90% stenosed.   Ost RCA to Prox RCA lesion is 100% stenosed.   Origin lesion is 100% stenosed.   Mid Graft lesion is 100% stenosed.   Dist LAD lesion is  80% stenosed.   Origin to Prox Graft lesion is 100% stenosed.   The left ventricular systolic function is normal.   LV end diastolic pressure is normal.   The left ventricular ejection fraction is 50-55% by visual estimate.   1.  Severe three-vessel coronary artery disease with 90% stenosis proximal/mid LAD, 85% stenosis ostial/proximal left circumflex, 100% stenosis proximal/mid left circumflex, 100% stenosis ostial RCA. Ipsi-collaterals from OM1-OM2/OM 3, contra collaterals from LAD/septal perforators to distal RCA 2.  Chronically occluded SVG to distal RCA, chronically occluded SVG to D1, occluded SVG to OM 2, patent LIMA to distal LAD 3.  Normal left ventricular function    Review of systems complete and found to be negative unless listed above    Past Medical History:  Diagnosis Date   Asthma    Coronary artery disease    Diabetes mellitus without complication (HCC)    Heart attack (HCC)    Hyperlipidemia    Hypertension    MI (myocardial infarction) (HCC)    Migraine headache with aura    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    Past Surgical History:  Procedure Laterality Date   ABDOMINAL HYSTERECTOMY     CARDIAC CATHETERIZATION     CARDIAC CATHETERIZATION N/A 10/18/2015   Procedure: Left Heart Cath and Cors/Grafts Angiography;  Surgeon: Dorn JINNY Lesches, MD;  Location: [facility] INVASIVE CV LAB;  Service: Cardiovascular;  Laterality: N/A;   CARDIAC CATHETERIZATION N/A 10/18/2015   Procedure: Coronary Stent Intervention;  Surgeon: Dorn JINNY Lesches, MD;  Location: [facility] CV [facility];  Service: Cardiovascular;  Laterality: N/A;  CARDIAC SURGERY     CHOLECYSTECTOMY     COLONOSCOPY WITH PROPOFOL  N/A 12/02/2021   Procedure: COLONOSCOPY WITH PROPOFOL ;  Surgeon: Therisa Bi, MD;  Location: [facility] ENDOSCOPY;  Service: Gastroenterology;  Laterality: N/A;   CORONARY ANGIOPLASTY     CORONARY ARTERY BYPASS GRAFT     4 vessels - 2010   CORONARY STENT INTERVENTION N/A 02/16/2017   Procedure:  CORONARY STENT INTERVENTION;  Surgeon: Florencio Cara BIRCH, MD;  Location: [facility] INVASIVE CV LAB;  Service: Cardiovascular;  Laterality: N/A;   CORONARY STENT INTERVENTION N/A 02/13/2019   Procedure: CORONARY STENT INTERVENTION;  Surgeon: Mady Bruckner, MD;  Location: [facility] INVASIVE CV LAB;  Service: Cardiovascular;  Laterality: N/A;  SVG to RCA   CORONARY STENT INTERVENTION Left 03/08/2019   Procedure: CORONARY STENT INTERVENTION;  Surgeon: Florencio Cara BIRCH, MD;  Location: [facility] INVASIVE CV LAB;  Service: Cardiovascular;  Laterality: Left;   CORONARY STENT INTERVENTION N/A 11/04/2020   Procedure: CORONARY STENT INTERVENTION;  Surgeon: Mady Bruckner, MD;  Location: [facility] INVASIVE CV LAB;  Service: Cardiovascular;  Laterality: N/A;   CORONARY STENT INTERVENTION N/A 12/24/2021   Procedure: CORONARY STENT INTERVENTION;  Surgeon: Lawyer Bernardino Cough, MD;  Location: [facility] INVASIVE CV LAB;  Service: Cardiovascular;  Laterality: N/A;   ESOPHAGOGASTRODUODENOSCOPY N/A 12/02/2021   Procedure: ESOPHAGOGASTRODUODENOSCOPY (EGD);  Surgeon: Therisa Bi, MD;  Location: [facility] ENDOSCOPY;  Service: Gastroenterology;  Laterality: N/A;   LEFT HEART CATH AND CORONARY ANGIOGRAPHY Left 02/16/2017   Procedure: LEFT HEART CATH AND CORONARY ANGIOGRAPHY;  Surgeon: Hester Wolm PARAS, MD;  Location: [facility] INVASIVE CV LAB;  Service: Cardiovascular;  Laterality: Left;   LEFT HEART CATH AND CORONARY ANGIOGRAPHY Left 12/24/2021   Procedure: LEFT HEART CATH AND CORONARY ANGIOGRAPHY;  Surgeon: Hester Wolm PARAS, MD;  Location: [facility] INVASIVE CV LAB;  Service: Cardiovascular;  Laterality: Left;   LEFT HEART CATH AND CORONARY ANGIOGRAPHY Left 06/17/2022   Procedure: LEFT HEART CATH AND CORONARY ANGIOGRAPHY;  Surgeon: Florencio Cara BIRCH, MD;  Location: [facility] INVASIVE CV LAB;  Service: Cardiovascular;  Laterality: Left;   LEFT HEART CATH AND CORS/GRAFTS ANGIOGRAPHY Left 11/23/2017   Procedure: LEFT HEART CATH AND CORS/GRAFTS ANGIOGRAPHY;  Surgeon:  Hester Wolm PARAS, MD;  Location: [facility] INVASIVE CV LAB;  Service: Cardiovascular;  Laterality: Left;   LEFT HEART CATH AND CORS/GRAFTS ANGIOGRAPHY N/A 02/13/2019   Procedure: LEFT HEART CATH AND CORS/GRAFTS ANGIOGRAPHY;  Surgeon: Hester Wolm PARAS, MD;  Location: [facility] INVASIVE CV LAB;  Service: Cardiovascular;  Laterality: N/A;   LEFT HEART CATH AND CORS/GRAFTS ANGIOGRAPHY N/A 07/03/2019   Procedure: LEFT HEART CATH AND CORS/GRAFTS ANGIOGRAPHY;  Surgeon: Hester Wolm PARAS, MD;  Location: [facility] INVASIVE CV LAB;  Service: Cardiovascular;  Laterality: N/A;   LEFT HEART CATH AND CORS/GRAFTS ANGIOGRAPHY N/A 11/04/2020   Procedure: LEFT HEART CATH AND CORS/GRAFTS ANGIOGRAPHY;  Surgeon: Hester Wolm PARAS, MD;  Location: [facility] INVASIVE CV LAB;  Service: Cardiovascular;  Laterality: N/A;   LEFT HEART CATH AND CORS/GRAFTS ANGIOGRAPHY N/A 02/14/2024   Procedure: LEFT HEART CATH AND CORS/GRAFTS ANGIOGRAPHY;  Surgeon: Ammon Blunt, MD;  Location: [facility] INVASIVE CV LAB;  Service: Cardiovascular;  Laterality: N/A;    (Not in a hospital admission)  Social History   Socioeconomic History   Marital status: Widowed    Spouse name: lynwood   Number of children: Not on file   Years of education: Not on file   Highest education level: Not on file  Occupational History   Not on file  Tobacco Use   Smoking  status: Never   Smokeless tobacco: Never  Vaping Use   Vaping status: Never Used  Substance and Sexual Activity   Alcohol use: No    Alcohol/week: 0.0 standard drinks of alcohol   Drug use: No   Sexual activity: Yes  Other Topics Concern   Not on file  Social History Narrative   Not on file   Social Drivers of Health   Financial Resource Strain: Low Risk  (01/27/2024)   Received from [facility]   Overall Financial Resource Strain (CARDIA)    Difficulty of Paying Living Expenses: Not very hard  Food Insecurity: No Food Insecurity (02/10/2024)   Hunger Vital Sign    Worried About  Running Out of Food in the Last Year: Never true    Ran Out of Food in the Last Year: Never true  Recent Concern: Food Insecurity - Food Insecurity Present (01/27/2024)   Received from [facility]   Hunger Vital Sign    Within the past 12 months, you worried that your food would run out before you got the money to buy more.: Sometimes true    Within the past 12 months, the food you bought just didn't last and you didn't have money to get more.: Sometimes true  Transportation Needs: No Transportation Needs (02/10/2024)   PRAPARE - Administrator, Civil Service (Medical): No    Lack of Transportation (Non-Medical): No  Physical Activity: Not on file  Stress: Not on file  Social Connections: Socially Integrated (02/10/2024)   Social Connection and Isolation Panel    Frequency of Communication with Friends and Family: Three times a week    Frequency of Social Gatherings with Friends and Family: Three times a week    Attends Religious Services: More than 4 times per year    Active Member of Clubs or Organizations: No    Attends Banker Meetings: 1 to 4 times per year    Marital Status: Married  Catering manager Violence: Not At Risk (02/10/2024)   Humiliation, Afraid, Rape, and Kick questionnaire    Fear of Current or Ex-Partner: No    Emotionally Abused: No    Physically Abused: No    Sexually Abused: No    Family History  Problem Relation Age of Onset   Diabetes Mother    Diabetes Father    Cancer Father    Diabetes Brother      Vitals:   02/15/24 2354 02/16/24 0317 02/16/24 0635 02/16/24 0700  BP: 111/70 101/68  100/65  Pulse: (!) 54 61  67  Resp: 14 11  15   Temp:   98.3 F (36.8 C)   TempSrc:   Oral   SpO2: 92% 96%  93%  Weight:      Height:        PHYSICAL EXAM General: Well appearing elderly female, well nourished, in no acute distress. HEENT: Normocephalic and atraumatic. Neck: No JVD.   Lungs: Normal respiratory effort on room  air. Clear bilaterally to auscultation. No wheezes, crackles, rhonchi.  Heart: HRRR. Normal S1 and S2 without gallops or murmurs.  Abdomen: Non-distended appearing.  Msk: Normal strength and tone for age. Extremities: Warm and well perfused. No clubbing, cyanosis, edema.  Neuro: Alert and oriented X 3. Psych: Answers questions appropriately.   Labs: Basic Metabolic Panel: Recent Labs    02/15/24 0214 02/16/24 0636  NA 134* 135  K 4.2 3.9  CL 101 101  CO2 21* 25  GLUCOSE  149* 114*  BUN 26* 21  CREATININE 0.92 0.85  CALCIUM  9.6 9.0   Liver Function Tests: No results for input(s): AST, ALT, ALKPHOS, BILITOT, PROT, ALBUMIN in the last 72 hours. No results for input(s): LIPASE, AMYLASE in the last 72 hours. CBC: Recent Labs    02/15/24 0214 02/16/24 0636  WBC 8.8 9.0  HGB 12.5 11.9*  HCT 39.4 36.9  MCV 80.7 81.3  PLT 283 242   Cardiac Enzymes: Recent Labs    02/15/24 0214 02/15/24 0435  TROPONINIHS 818* 1,181*   BNP: No results for input(s): BNP in the last 72 hours. D-Dimer: No results for input(s): DDIMER in the last 72 hours. Hemoglobin A1C: No results for input(s): HGBA1C in the last 72 hours. Fasting Lipid Panel: No results for input(s): CHOL, HDL, LDLCALC, TRIG, CHOLHDL, LDLDIRECT in the last 72 hours. Thyroid  Function Tests: No results for input(s): TSH, T4TOTAL, T3FREE, THYROIDAB in the last 72 hours.  Invalid input(s): FREET3 Anemia Panel: No results for input(s): VITAMINB12, FOLATE, FERRITIN, TIBC, IRON, RETICCTPCT in the last 72 hours.   Radiology: CARDIAC CATHETERIZATION Addendum Date: 02/15/2024   Lida Cx to Prox Cx lesion is 85% stenosed.   Prox Cx to Mid Cx lesion is 100% stenosed.   Prox LAD lesion is 70% stenosed.   Prox LAD to Mid LAD lesion is 90% stenosed.   Ost RCA to Prox RCA lesion is 100% stenosed.   Origin lesion is 100% stenosed.   Mid Graft lesion is 100% stenosed.   Dist LAD  lesion is 80% stenosed.   Origin to Prox Graft lesion is 100% stenosed.   The left ventricular systolic function is normal.   LV end diastolic pressure is normal.   The left ventricular ejection fraction is 50-55% by visual estimate. 1.  Severe three-vessel coronary artery disease with 90% stenosis proximal/mid LAD, 85% stenosis ostial/proximal left circumflex, 100% stenosis proximal/mid left circumflex, 100% stenosis ostial RCA. Ipsi-collaterals from OM1-OM2/OM 3, contra collaterals from LAD/septal perforators to distal RCA 2.  Chronically occluded SVG to distal RCA, chronically occluded SVG to D1, occluded SVG to OM 2, patent LIMA to distal LAD 3.  Normal left ventricular function Recommendations 1.  Medical therapy 2.  Maximize medical therapy 3.  Aggressive risk factor modification  Result Date: 02/15/2024   Ost Cx to Prox Cx lesion is 85% stenosed.   Prox Cx to Mid Cx lesion is 100% stenosed.   Prox LAD lesion is 70% stenosed.   Prox LAD to Mid LAD lesion is 90% stenosed.   Ost RCA to Prox RCA lesion is 100% stenosed.   Origin lesion is 100% stenosed.   Mid Graft lesion is 100% stenosed.   Dist LAD lesion is 80% stenosed.   The left ventricular systolic function is normal.   LV end diastolic pressure is normal.   The left ventricular ejection fraction is 50-55% by visual estimate. 1.  Severe three-vessel coronary artery disease with 90% stenosis proximal/mid LAD, 85% stenosis ostial/proximal left circumflex, 100% stenosis proximal/mid left circumflex, 100% stenosis ostial RCA. Ipsi-collaterals from OM1-OM2/OM 3, contra collaterals from LAD/septal perforators to distal RCA 2.  Chronically occluded SVG to distal RCA, occluded SVG to OM 2, patent LIMA to distal LAD 3.  Normal left ventricular function Recommendations 1.  Medical therapy 2.  Maximize medical therapy 3.  Aggressive risk factor modification   DG Chest 2 View Result Date: 02/15/2024 CLINICAL DATA:  Chest pain EXAM: CHEST - 2 VIEW COMPARISON:   02/09/2024 FINDINGS: Cardiac  shadow is enlarged but stable. Postsurgical changes are again seen. The lungs are well aerated bilaterally. Basilar atelectasis is noted bilaterally. No focal infiltrate or effusion is seen. IMPRESSION: Bibasilar atelectatic changes. Electronically Signed   By: Oneil Devonshire M.D.   On: 02/15/2024 02:34   ECHOCARDIOGRAM COMPLETE Result Date: 02/11/2024    ECHOCARDIOGRAM REPORT   Patient Name:   Tishia Odell Date of Exam: 02/10/2024 Medical Rec #:  [id number]      Height:       67.0 in Accession #:    [id number]     Weight:       223.0 lb Date of Birth:  [birth year]     BSA:          2.118 m Patient Age:    67 years       BP:           102/84 mmHg Patient Gender: F              HR:           61 bpm. Exam Location:  [facility] Procedure: 2D Echo, Cardiac Doppler and Color Doppler (Both Spectral and Color            Flow Doppler were utilized during procedure). Indications:     Chest Pain R07.9  History:         Patient has prior history of Echocardiogram examinations, most                  recent 04/07/2022. CHF, Previous Myocardial Infarction, Acute                  MI, CAD and Angina, Prior CABG, Signs/Symptoms:Shortness of                  Breath; Risk Factors:Diabetes and Dyslipidemia.  Sonographer:     Thea Norlander RCS Referring Phys:  [id number] DORENE COMFORT Diagnosing Phys: Cara JONETTA Lovelace MD IMPRESSIONS  1. Left ventricular ejection fraction, by estimation, is 55 to 60%. The left ventricle has normal function. The left ventricle has no regional wall motion abnormalities. Left ventricular diastolic parameters were normal.  2. Right ventricular systolic function is normal. The right ventricular size is normal.  3. The mitral valve is normal in structure. Trivial mitral valve regurgitation.  4. The aortic valve is normal in structure. Aortic valve regurgitation is not visualized. FINDINGS  Left Ventricle: Left ventricular ejection fraction, by estimation, is 55 to 60%. The left  ventricle has normal function. The left ventricle has no regional wall motion abnormalities. Strain was performed and the global longitudinal strain is indeterminate. The left ventricular internal cavity size was normal in size. There is no left ventricular hypertrophy. Left ventricular diastolic parameters were normal. Right Ventricle: The right ventricular size is normal. No increase in right ventricular wall thickness. Right ventricular systolic function is normal. Left Atrium: Left atrial size was normal in size. Right Atrium: Right atrial size was normal in size. Pericardium: There is no evidence of pericardial effusion. Mitral Valve: The mitral valve is normal in structure. Trivial mitral valve regurgitation. Tricuspid Valve: The tricuspid valve is normal in structure. Tricuspid valve regurgitation is not demonstrated. Aortic Valve: The aortic valve is normal in structure. Aortic valve regurgitation is not visualized. Aortic valve peak gradient measures 4.2 mmHg. Pulmonic Valve: The pulmonic valve was normal in structure. Pulmonic valve regurgitation is not visualized. Aorta: The ascending aorta was not well visualized. IAS/Shunts: No atrial level shunt detected by  color flow Doppler. Additional Comments: 3D was performed not requiring image post processing on an independent workstation and was indeterminate.  LEFT VENTRICLE PLAX 2D LVIDd:         3.80 cm   Diastology LVIDs:         2.70 cm   LV e' medial:    4.24 cm/s LV PW:         1.05 cm   LV E/e' medial:  15.4 LV IVS:        0.80 cm   LV e' lateral:   7.72 cm/s LVOT diam:     1.90 cm   LV E/e' lateral: 8.5 LV SV:         37 LV SV Index:   17 LVOT Area:     2.84 cm  RIGHT VENTRICLE            IVC RV S prime:     4.20 cm/s  IVC diam: 1.60 cm LEFT ATRIUM         Index LA diam:    3.55 cm 1.68 cm/m  AORTIC VALVE AV Area (Vmax): 2.20 cm AV Vmax:        102.00 cm/s AV Peak Grad:   4.2 mmHg LVOT Vmax:      79.10 cm/s LVOT Vmean:     49.800 cm/s LVOT VTI:        0.129 m  AORTA Ao Root diam: 2.90 cm Ao Asc diam:  3.00 cm MITRAL VALVE MV Area (PHT): 3.99 cm    SHUNTS MV Decel Time: 190 msec    Systemic VTI:  0.13 m MV E velocity: 65.50 cm/s  Systemic Diam: 1.90 cm MV A velocity: 56.60 cm/s MV E/A ratio:  1.16 Dwayne JONETTA Lovelace MD Electronically signed by Cara JONETTA Lovelace MD Signature Date/Time: 02/11/2024/11:13:05 AM    Final    DG Chest Port 1 View Result Date: 02/09/2024 CLINICAL DATA:  Chest pain EXAM: PORTABLE CHEST 1 VIEW COMPARISON:  04/06/2022 FINDINGS: Stable cardiomediastinal silhouette. Aortic atherosclerotic calcification. Sternotomy. Bibasilar airspace opacities favor atelectasis. No pleural effusion or pneumothorax. IMPRESSION: Bibasilar airspace opacities favor atelectasis. Electronically Signed   By: Norman Gatlin M.D.   On: 02/09/2024 22:14    ECHO as above  TELEMETRY reviewed by me 02/16/2024: sinus rhythm, rate 70s with PACs  EKG reviewed by me:  Trops elevated and trended 818 > 1181. EKG with sinus rhythm, rate 51 bpm with RBBB.  Data reviewed by me 02/16/2024: last 24h vitals tele labs imaging I/O hospitalist progress notes.  Principal Problem:   Chest pain Active Problems:   STEMI 10/18/15   Hx of CABG 2010   Diabetes mellitus (HCC)   Morbid obesity (HCC)   Essential hypertension   Dyslipidemia    ASSESSMENT AND PLAN:  Felicia Woods is a 68 y.o. female  with a past medical history of coronary artery disease s/p CABGx4, multiple stents, chronic angina, hypertension, hyperlipidemia, type 2 diabetes mellitus, chronic HFpEF, obesity, asthma, COPD  who presented to the ED on 02/15/2024 for recurrent chest pressure/heaviness. Patient had recent diagnostic LHC on 08/12 that revealed severe 3-vessel CAD with chronically occluded SVG to distal RCA, chronically occluded SVG to D1 and occluded SVG to OM2, contra collaterals from LAD/septal perforators to distal RCA, no intervention performed and optimized medical management. Patient was  discharged on 08/12. Patient states at home while in bed she developed severe chest pressure that radiates to left arm associated with diaphoresis and SOB. Patient reported to ED  due to recurrent/severe chest pressure. Cardiology was consulted for further evaluation.   # Chronic Angina # Coronary artery disease s/p CABG x 4, multiple stents # Hypertension # Hyperlipidemia  # Elevated troponin, in setting of chronically occluded grafts -Continue IV heparin  infusion until discharge.  -Continue PRN IV morphine  for severe chest pressure/pain. -Continue aspirin  81 mg, plavix  75 mg daily.  -Continue atorvastatin  to 80 mg daily. Recommend goal LDL < 50. -Continue losartan  50 mg daily.  -Continue amlodipine  5 mg daily.  -Continue home Imdur  120 mg twice daily when off nitroglycerin  gtt.  -Continue home Ranexa  1000 mg twice daily -Continue home metoprolol  succinate 100 mg daily. Uptitration as HR allows. -Discussed case with [facility] interventionists and recommend to continued with medical management. Acute intervention can cause more harm at this time. Ongoing discussions about [facility] reviewing all prior heart catheterizations and CABG procedure to potentially intervene in the future, this can be discussed at outpatient follow-up.    # Chronic HFpEF Appears euvolemic on exam. -Continue home lasix  40 mg daily   -Continue home dapagliflozin  25 mg daily. -Losartan , metoprolol  as stated above.   This patient's plan of care was discussed and created with Dr. Ammon and he is in agreement.  Signed: Tamu Golz, PA-C  02/16/2024, 10:15 AM [facility] Cardiology

## 2024-02-17 ENCOUNTER — Telehealth (HOSPITAL_COMMUNITY): Payer: Self-pay | Admitting: Pharmacy Technician

## 2024-02-17 ENCOUNTER — Other Ambulatory Visit (HOSPITAL_COMMUNITY): Payer: Self-pay

## 2024-02-17 ENCOUNTER — Other Ambulatory Visit: Payer: Self-pay

## 2024-02-17 DIAGNOSIS — R072 Precordial pain: Secondary | ICD-10-CM | POA: Diagnosis not present

## 2024-02-17 DIAGNOSIS — I214 Non-ST elevation (NSTEMI) myocardial infarction: Secondary | ICD-10-CM | POA: Diagnosis not present

## 2024-02-17 LAB — GLUCOSE, CAPILLARY: Glucose-Capillary: 121 mg/dL — ABNORMAL HIGH (ref 70–99)

## 2024-02-17 LAB — CBC
HCT: 38.6 % (ref 36.0–46.0)
Hemoglobin: 12.4 g/dL (ref 12.0–15.0)
MCH: 26.2 pg (ref 26.0–34.0)
MCHC: 32.1 g/dL (ref 30.0–36.0)
MCV: 81.6 fL (ref 80.0–100.0)
Platelets: 238 K/uL (ref 150–400)
RBC: 4.73 MIL/uL (ref 3.87–5.11)
RDW: 16.7 % — ABNORMAL HIGH (ref 11.5–15.5)
WBC: 8.2 K/uL (ref 4.0–10.5)
nRBC: 0 % (ref 0.0–0.2)

## 2024-02-17 LAB — HEPARIN LEVEL (UNFRACTIONATED): Heparin Unfractionated: 0.2 [IU]/mL — ABNORMAL LOW (ref 0.30–0.70)

## 2024-02-17 MED ORDER — OXYCODONE HCL 5 MG PO TABS
5.0000 mg | ORAL_TABLET | Freq: Three times a day (TID) | ORAL | 0 refills | Status: DC | PRN
  Filled 2024-02-17: qty 10, 4d supply, fill #0

## 2024-02-17 MED ORDER — ACETAMINOPHEN 325 MG PO TABS: 650.0000 mg | ORAL_TABLET | Freq: Four times a day (QID) | ORAL | Status: DC | PRN

## 2024-02-17 MED ORDER — HEPARIN BOLUS VIA INFUSION
1300.0000 [IU] | Freq: Once | INTRAVENOUS | Status: AC
  Administered 2024-02-17: 1300 [IU] via INTRAVENOUS
  Filled 2024-02-17: qty 1300

## 2024-02-17 MED ORDER — EZETIMIBE 10 MG PO TABS
10.0000 mg | ORAL_TABLET | Freq: Every day | ORAL | Status: DC
  Administered 2024-02-17: 10 mg via ORAL
  Filled 2024-02-17: qty 1

## 2024-02-17 MED ORDER — ONDANSETRON HCL 4 MG/2ML IJ SOLN
4.0000 mg | Freq: Once | INTRAMUSCULAR | Status: AC
  Administered 2024-02-17: 4 mg via INTRAVENOUS
  Filled 2024-02-17: qty 2

## 2024-02-17 MED ORDER — EZETIMIBE 10 MG PO TABS
10.0000 mg | ORAL_TABLET | Freq: Every day | ORAL | 0 refills | Status: DC
  Filled 2024-02-17: qty 30, 30d supply, fill #0

## 2024-02-17 MED ORDER — ONDANSETRON HCL 4 MG/2ML IJ SOLN: 4.0000 mg | Freq: Four times a day (QID) | INTRAMUSCULAR | Status: DC | PRN

## 2024-02-17 MED ORDER — HYDROCODONE-ACETAMINOPHEN 5-325 MG PO TABS: 1.0000 | ORAL_TABLET | Freq: Three times a day (TID) | ORAL | Status: DC | PRN

## 2024-02-17 MED ORDER — OXYCODONE HCL 5 MG PO TABS: 5.0000 mg | ORAL_TABLET | Freq: Three times a day (TID) | ORAL | Status: DC | PRN

## 2024-02-17 MED ORDER — ACETAMINOPHEN 650 MG RE SUPP: 650.0000 mg | Freq: Four times a day (QID) | RECTAL | Status: DC | PRN

## 2024-02-17 MED ORDER — REPATHA SURECLICK 140 MG/ML ~~LOC~~ SOAJ
140.0000 mg | SUBCUTANEOUS | 2 refills | Status: DC
  Filled 2024-02-17: qty 2, 28d supply, fill #0

## 2024-02-17 NOTE — Telephone Encounter (Signed)
 Pharmacy Patient Advocate Encounter  Received notification from [facility] that Prior Authorization for Repatha  SureClick 140MG /ML auto-injectors  has been APPROVED from 02/17/2024 to 0815/2026. Ran test claim, Copay is $4.80. This test claim was processed through [facility]- copay amounts may vary at other pharmacies due to pharmacy/plan contracts, or as the patient moves through the different stages of their insurance plan.   PA #/Case ID/Reference #: [id number] KEY: [id number]

## 2024-02-17 NOTE — Discharge Instructions (Signed)
 Rent/Utility/Housing  Agency Name: [facility] Agency Address: [address] [place], [place] [address] Phone: [phone] Email: [email or web address] Website: [email or web address] Service(s) Offered: Housing services, self-sufficiency, congregate meal program, weatherization program, Field seismologist program, emergency food assistance,  housing counseling, home ownership program, wheels -towork program.  Agency Name: [facility] Address: [address] [place], [place] [address] Phone: [phone] (8a-4p) [phone] (8p- 10p) Email: [email or web address] Website: [email or web address] Service(s) Offered: A program for homeless and/or needy men that includes one-on-one counseling, life skills training and job rehabilitation.  Agency Name: [facility] Address: [address] [place], [place] [address] Phone: [phone] Website: [email or web address] Service(s) Offered: Assistance to needy in emergency with utility bills, heating fuel, and prescriptions. Shelter for homeless 7pm-7am. October 28, 2016 15  Agency Name: [facility] [place] [facility] Address: [address], [place], [place] [address] Phone: [phone] Contact Person: Cathleen Corti Email: [email or web address] Website: [email or web address] Service(s) Offered: Helps individuals with developmental disabilities move from housing that is more restrictive to homes where they  can achieve greater independence and have more  opportunities.  Agency Name: [facility] Address: [address], [place], [place] [address] Phone: [phone] Email: [email or web address] Website: [email or web address].org Service(s) Offered: Provides affordable housing for low-income families, elderly, and disabled individuals. Offer a wide range of  programs and services, from financial planning to  afterschool and summer programs.  Agency Name: [facility] Address: [address] [place], [place] [address] Phone: [phone] Service(s) Offered: Child support services; child welfare services; food stamps; Medicaid; work first family assistance; and aid with fuel,  rent, food and medicine.  Agency Name: [facility] [place] [employer] Address: [facility] [address], [place], [place]  [address] Phone: [phone] Website: www.familyabuseservices.org Service(s) Offered: 24 hour Crisis Line: [phone]; 24 hour Emergency Shelter; Transitional Housing; Support Groups; Scientist, physiological; [employer]; Hispanic Outreach: [phone];  Visitation Center: [phone].  Agency Name: [facility] [place]. Address: [address] [place], [place] [address] Phone: [phone] Service(s) Offered: CAP Services; Home and [employer]; Individual or Group Supports; Respite Care Non-Institutional Nursing;  Residential Supports; Respite Care and Personal Care Services; Transportation; Family and Friends Night; Recreational Activities; Three Nutritious Meals/Snacks; Consultation with Registered Dietician; Twenty-four hour Registered Nurse Access; Daily and [employer]; [employer]; [place] for the [employer] (During Summer Months) Bingo Night (Every  Wednesday Night); Special Populations Dance Night  (Every Tuesday Night); Professional Hair Care Services.  Agency Name: [facility] Address: [address], [place], [place] [address] Phone: [phone] Contact Person: Jabier Mutton Website: [email or web address].Physicist, medical) Offered: Residential treatment facility for women; food and  clothing, educational & employment development and  transportation to work; Counsellor of financial skills;  parenting and family reunification; emotional and spiritual  support;  transitional housing for program graduates.  Agency Name: [facility] Address: [address] [place], [place] [address] Phone: [phone] Email: [email or web address] Website: [email or web address].es Service(s) Offered: Public housing units for elderly, disabled, and low income people; housing choice vouchers for income eligible  applicants; shelter plus care vouchers; and Psychologist, clinical.  Agency Name: [facility] [employer] Address: [address] [place], [place] [address] Phone: [phone] Email: [email or web address] Website: www.habitatalamance.org Service(s) Offered: Build houses for families in need of decent housing. Each adult in the family must invest 200 hours of labor on  someone else's house, work with volunteers to build their own house, attend classes  on budgeting, home maintenance, yard care, and attend homeowner association meetings.  Agency Name: [facility] Address: [address], [place], [place] [address] Phone: [phone] Website: www.rsli.org Service(s) Offered: Intermediate care facilities for intellectually delayed, Supervised Living in group homes for adults with developmental disabilities, Supervised Living for people who have dual diagnoses (MRMI), Independent Living, Supported Living, respite and a variety of CAP services, pre-vocational services, day supports, and [employer].  Agency Name: [employer] Phone: [phone] Website: www.[place]CForeclosurePrevention.gov Service(s) Offered: Zero-interest, deferred loans to homeowners struggling to pay their mortgage. Call for more information.

## 2024-02-17 NOTE — Progress Notes (Addendum)
 [facility] CLINIC CARDIOLOGY PROGRESS NOTE       Patient ID: Felicia Woods MRN: [id number] DOB/AGE: Jul 28, 1955 68 y.o.  Admit date: 02/15/2024 Referring Physician Dr. Roann Primary Physician McDonough, Tinnie POUR, PA-C Primary Cardiologist Dr. Florencio Reason for Consultation chest pain  HPI: Felicia Woods is a 67 y.o. female  with a past medical history of coronary artery disease s/p CABGx4, multiple stents, chronic angina, hypertension, hyperlipidemia, type 2 diabetes mellitus, chronic HFpEF, obesity, asthma, COPD  who presented to the ED on 02/15/2024 for recurrent chest pressure/heaviness. Patient had recent diagnostic LHC on 08/12 that revealed severe 3-vessel CAD with chronically occluded SVG to distal RCA, chronically occluded SVG to D1 and occluded SVG to OM2, contra collaterals from LAD/septal perforators to distal RCA, no intervention performed and optimized medical management. Patient was discharged on 08/12. Patient states at home while in bed she developed severe chest pressure that radiates to left arm associated with diaphoresis and SOB. Patient reported to ED due to recurrent/severe chest pressure. Cardiology was consulted for further evaluation.   Interval History: -Patient seen and examined this AM and laying comfortably in hospital bed. Denies chest pain/pressure after overnight dose of morphine . Denies SOB.  -Appears euvolemic on exam. -Patients BP and HR stable this AM. Overnight Tele showed no significant events.  -Patient remains on room air with stable SpO2.     Pertinent Cardiac History (Most recent) LHC (08/12) with Dr. Ammon Pais Cx to Prox Cx lesion is 85% stenosed.   Prox Cx to Mid Cx lesion is 100% stenosed.   Prox LAD lesion is 70% stenosed.   Prox LAD to Mid LAD lesion is 90% stenosed.   Ost RCA to Prox RCA lesion is 100% stenosed.   Origin lesion is 100% stenosed.   Mid Graft lesion is 100% stenosed.   Dist LAD lesion is 80% stenosed.   Origin to  Prox Graft lesion is 100% stenosed.   The left ventricular systolic function is normal.   LV end diastolic pressure is normal.   The left ventricular ejection fraction is 50-55% by visual estimate.   1.  Severe three-vessel coronary artery disease with 90% stenosis proximal/mid LAD, 85% stenosis ostial/proximal left circumflex, 100% stenosis proximal/mid left circumflex, 100% stenosis ostial RCA. Ipsi-collaterals from OM1-OM2/OM 3, contra collaterals from LAD/septal perforators to distal RCA 2.  Chronically occluded SVG to distal RCA, chronically occluded SVG to D1, occluded SVG to OM 2, patent LIMA to distal LAD 3.  Normal left ventricular function    Review of systems complete and found to be negative unless listed above    Past Medical History:  Diagnosis Date   Asthma    Coronary artery disease    Diabetes mellitus without complication (HCC)    Heart attack (HCC)    Hyperlipidemia    Hypertension    MI (myocardial infarction) (HCC)    Migraine headache with aura    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    Past Surgical History:  Procedure Laterality Date   ABDOMINAL HYSTERECTOMY     CARDIAC CATHETERIZATION     CARDIAC CATHETERIZATION N/A 10/18/2015   Procedure: Left Heart Cath and Cors/Grafts Angiography;  Surgeon: Dorn JINNY Lesches, MD;  Location: [facility] INVASIVE CV LAB;  Service: Cardiovascular;  Laterality: N/A;   CARDIAC CATHETERIZATION N/A 10/18/2015   Procedure: Coronary Stent Intervention;  Surgeon: Dorn JINNY Lesches, MD;  Location: [facility] INVASIVE CV LAB;  Service: Cardiovascular;  Laterality: N/A;   CARDIAC SURGERY  CHOLECYSTECTOMY     COLONOSCOPY WITH PROPOFOL  N/A 12/02/2021   Procedure: COLONOSCOPY WITH PROPOFOL ;  Surgeon: Therisa Bi, MD;  Location[facility] ENDOSCOPY;  Service: Gastroenterology;  Laterality: N/A;   CORONARY ANGIOPLASTY     CORONARY ARTERY BYPASS GRAFT     4 vessels - 2010   CORONARY STENT INTERVENTION N/A 02/16/2017   Procedure: CORONARY STENT INTERVENTION;   Surgeon: Florencio Cara BIRCH, MD;  Location: [facility] INVASIVE CV LAB;  Service: Cardiovascular;  Laterality: N/A;   CORONARY STENT INTERVENTION N/A 02/13/2019   Procedure: CORONARY STENT INTERVENTION;  Surgeon: Mady Bruckner, MD;  Location: [facility] INVASIVE CV LAB;  Service: Cardiovascular;  Laterality: N/A;  SVG to RCA   CORONARY STENT INTERVENTION Left 03/08/2019   Procedure: CORONARY STENT INTERVENTION;  Surgeon: Florencio Cara BIRCH, MD;  Location: [facility] INVASIVE CV LAB;  Service: Cardiovascular;  Laterality: Left;   CORONARY STENT INTERVENTION N/A 11/04/2020   Procedure: CORONARY STENT INTERVENTION;  Surgeon: Mady Bruckner, MD;  Location: [facility] INVASIVE CV LAB;  Service: Cardiovascular;  Laterality: N/A;   CORONARY STENT INTERVENTION N/A 12/24/2021   Procedure: CORONARY STENT INTERVENTION;  Surgeon: Lawyer Bernardino Cough, MD;  Location: [facility] INVASIVE CV LAB;  Service: Cardiovascular;  Laterality: N/A;   ESOPHAGOGASTRODUODENOSCOPY N/A 12/02/2021   Procedure: ESOPHAGOGASTRODUODENOSCOPY (EGD);  Surgeon: Therisa Bi, MD;  Location: [facility] ENDOSCOPY;  Service: Gastroenterology;  Laterality: N/A;   LEFT HEART CATH AND CORONARY ANGIOGRAPHY Left 02/16/2017   Procedure: LEFT HEART CATH AND CORONARY ANGIOGRAPHY;  Surgeon: Hester Wolm PARAS, MD;  Location: [facility] INVASIVE CV LAB;  Service: Cardiovascular;  Laterality: Left;   LEFT HEART CATH AND CORONARY ANGIOGRAPHY Left 12/24/2021   Procedure: LEFT HEART CATH AND CORONARY ANGIOGRAPHY;  Surgeon: Hester Wolm PARAS, MD;  Location: [facility] INVASIVE CV LAB;  Service: Cardiovascular;  Laterality: Left;   LEFT HEART CATH AND CORONARY ANGIOGRAPHY Left 06/17/2022   Procedure: LEFT HEART CATH AND CORONARY ANGIOGRAPHY;  Surgeon: Florencio Cara BIRCH, MD;  Location: [facility] INVASIVE CV LAB;  Service: Cardiovascular;  Laterality: Left;   LEFT HEART CATH AND CORS/GRAFTS ANGIOGRAPHY Left 11/23/2017   Procedure: LEFT HEART CATH AND CORS/GRAFTS ANGIOGRAPHY;  Surgeon: Hester Wolm PARAS, MD;   Location: [facility] INVASIVE CV LAB;  Service: Cardiovascular;  Laterality: Left;   LEFT HEART CATH AND CORS/GRAFTS ANGIOGRAPHY N/A 02/13/2019   Procedure: LEFT HEART CATH AND CORS/GRAFTS ANGIOGRAPHY;  Surgeon: Hester Wolm PARAS, MD;  Location: [facility] INVASIVE CV LAB;  Service: Cardiovascular;  Laterality: N/A;   LEFT HEART CATH AND CORS/GRAFTS ANGIOGRAPHY N/A 07/03/2019   Procedure: LEFT HEART CATH AND CORS/GRAFTS ANGIOGRAPHY;  Surgeon: Hester Wolm PARAS, MD;  Location: [facility] INVASIVE CV LAB;  Service: Cardiovascular;  Laterality: N/A;   LEFT HEART CATH AND CORS/GRAFTS ANGIOGRAPHY N/A 11/04/2020   Procedure: LEFT HEART CATH AND CORS/GRAFTS ANGIOGRAPHY;  Surgeon: Hester Wolm PARAS, MD;  Location: [facility] INVASIVE CV LAB;  Service: Cardiovascular;  Laterality: N/A;   LEFT HEART CATH AND CORS/GRAFTS ANGIOGRAPHY N/A 02/14/2024   Procedure: LEFT HEART CATH AND CORS/GRAFTS ANGIOGRAPHY;  Surgeon: Ammon Blunt, MD;  Location: [facility] INVASIVE CV LAB;  Service: Cardiovascular;  Laterality: N/A;    Medications Prior to Admission  Medication Sig Dispense Refill Last Dose/Taking   aspirin  EC 81 MG tablet Take 81 mg by mouth daily.   02/14/2024   atorvastatin  (LIPITOR ) 40 MG tablet Take 2 tablets (80 mg total) by mouth daily. 90 tablet 3 02/14/2024   Budeson-Glycopyrrol-Formoterol  (BREZTRI  AEROSPHERE) 160-9-4.8 MCG/ACT AERO Inhale 2 puffs into the lungs 2 (two) times daily. 10.7 g  11 02/14/2024   clopidogrel  (PLAVIX ) 75 MG tablet Take 1 tablet (75 mg total) by mouth daily. 90 tablet 0 02/14/2024 Morning   empagliflozin  (JARDIANCE ) 25 MG TABS tablet Take one tab a day for diabetes 90 tablet 3 02/14/2024   escitalopram  (LEXAPRO ) 10 MG tablet Take 1 tablet (10 mg total) by mouth daily. 30 tablet 2 02/14/2024   famotidine  (PEPCID ) 20 MG tablet Take 1 tablet (20 mg total) by mouth 2 (two) times daily. 90 tablet 1 02/14/2024   furosemide  (LASIX ) 40 MG tablet Take 1 tablet (40 mg total) by mouth daily. 180 tablet 1 02/14/2024    hydrALAZINE  (APRESOLINE ) 10 MG tablet Take 1 tablet (10 mg total) by mouth 3 (three) times daily. 90 tablet 2 02/14/2024   isosorbide  mononitrate (IMDUR ) 120 MG 24 hr tablet Take 1 tablet (120 mg total) by mouth 2 (two) times daily. 60 tablet 1 02/14/2024   losartan  (COZAAR ) 100 MG tablet Take 0.5 tablets (50 mg total) by mouth daily. 90 tablet 3 02/14/2024   methocarbamol  (ROBAXIN ) 500 MG tablet Take 500 mg by mouth 3 (three) times daily as needed for muscle spasms.   Taking As Needed   metoprolol  succinate (TOPROL -XL) 100 MG 24 hr tablet Take 1 tablet (100 mg total) by mouth daily. 30 tablet 1 02/14/2024   montelukast  (SINGULAIR ) 10 MG tablet Take 1 tablet (10 mg total) by mouth at bedtime. 90 tablet 1 02/14/2024   nitroGLYCERIN  (NITROSTAT ) 0.4 MG SL tablet DISSOLVE ONE TABLET UNDER THE TONGUE EVERY 5 MINUTES AS NEEDED FOR CHEST PAIN.  DO NOT EXCEED A TOTAL OF 3 DOSES IN 15 MINUTES 30 tablet 3 Taking   oxybutynin  (DITROPAN ) 5 MG tablet Take 1 tablet (5 mg total) by mouth daily. 90 tablet 1 02/14/2024   PROAIR  RESPICLICK 108 (90 Base) MCG/ACT AEPB Inhale 2 puffs into the lungs every 6 (six) hours as needed. 3 each 1 Taking As Needed   ranolazine  (RANEXA ) 1000 MG SR tablet Take 1 tablet (1,000 mg total) by mouth 2 (two) times daily. 60 tablet 2 02/14/2024   tirzepatide  (MOUNJARO ) 2.5 MG/0.5ML Pen Inject 2.5 mg into the skin once a week. 2 mL 2 02/03/2024   traMADol  (ULTRAM ) 50 MG tablet Take 50 mg by mouth every 6 (six) hours as needed. for pain   Taking As Needed   zolpidem  (AMBIEN ) 5 MG tablet Take 1 tablet (5 mg total) by mouth at bedtime as needed. for sleep 30 tablet 2 Taking As Needed   Blood Glucose Monitoring Suppl (ONETOUCH VERIO FLEX SYSTEM) w/Device KIT Use as directed twice a daily DX E11.65 1 kit 0    glucose blood (ONETOUCH VERIO) test strip Use as instructed twice daily DX E11.65 100 each 3    Misc. Devices (ADJUST BATH/SHOWER SEAT) MISC 1 shower seat for use at home to reduce risk of falling. 1  each 0    OneTouch Delica Lancets 30G MISC USE 1  TO CHECK GLUCOSE TWICE DAILY AS DIRECTED 100 each 0    Social History   Socioeconomic History   Marital status: Widowed    Spouse name: lynwood   Number of children: Not on file   Years of education: Not on file   Highest education level: Not on file  Occupational History   Not on file  Tobacco Use   Smoking status: Never   Smokeless tobacco: Never  Vaping Use   Vaping status: Never Used  Substance and Sexual Activity   Alcohol use: No  Alcohol/week: 0.0 standard drinks of alcohol   Drug use: No   Sexual activity: Yes  Other Topics Concern   Not on file  Social History Narrative   Not on file   Social Drivers of Health   Financial Resource Strain: Low Risk  (01/27/2024)   Received from [facility]   Overall Financial Resource Strain (CARDIA)    Difficulty of Paying Living Expenses: Not very hard  Food Insecurity: No Food Insecurity (02/16/2024)   Hunger Vital Sign    Worried About Running Out of Food in the Last Year: Never true    Ran Out of Food in the Last Year: Never true  Recent Concern: Food Insecurity - Food Insecurity Present (01/27/2024)   Received from [facility]   Hunger Vital Sign    Within the past 12 months, you worried that your food would run out before you got the money to buy more.: Sometimes true    Within the past 12 months, the food you bought just didn't last and you didn't have money to get more.: Sometimes true  Transportation Needs: No Transportation Needs (02/16/2024)   PRAPARE - Administrator, Civil Service (Medical): No    Lack of Transportation (Non-Medical): No  Physical Activity: Not on file  Stress: Not on file  Social Connections: Socially Integrated (02/16/2024)   Social Connection and Isolation Panel    Frequency of Communication with Friends and Family: More than three times a week    Frequency of Social Gatherings with Friends and  Family: Never    Attends Religious Services: More than 4 times per year    Active Member of [employer] or Organizations: Yes    Attends Banker Meetings: Never    Marital Status: Married  Catering manager Violence: Not At Risk (02/16/2024)   Humiliation, Afraid, Rape, and Kick questionnaire    Fear of Current or Ex-Partner: No    Emotionally Abused: No    Physically Abused: No    Sexually Abused: No    Family History  Problem Relation Age of Onset   Diabetes Mother    Diabetes Father    Cancer Father    Diabetes Brother      Vitals:   02/16/24 1721 02/16/24 1945 02/17/24 0355 02/17/24 0749  BP: 123/71 102/62 97/71 113/71  Pulse:  70 (!) 58 66  Resp:  20 20   Temp:  97.8 F (36.6 C) (!) 97.5 F (36.4 C) 98.2 F (36.8 C)  TempSrc:  Oral Oral Oral  SpO2:  91% 94% 96%  Weight:      Height:        PHYSICAL EXAM General: Well appearing elderly female, well nourished, in no acute distress. HEENT: Normocephalic and atraumatic. Neck: No JVD.   Lungs: Normal respiratory effort on room air. Clear bilaterally to auscultation. No wheezes, crackles, rhonchi.  Heart: HRRR. Normal S1 and S2 without gallops or murmurs.  Abdomen: Non-distended appearing.  Msk: Normal strength and tone for age. Extremities: Warm and well perfused. No clubbing, cyanosis, edema.  Neuro: Alert and oriented X 3. Psych: Answers questions appropriately.   Labs: Basic Metabolic Panel: Recent Labs    02/15/24 0214 02/16/24 0636  NA 134* 135  K 4.2 3.9  CL 101 101  CO2 21* 25  GLUCOSE 149* 114*  BUN 26* 21  CREATININE 0.92 0.85  CALCIUM  9.6 9.0   Liver Function Tests: No results for input(s): AST, ALT, ALKPHOS, BILITOT, PROT, ALBUMIN  in the last 72 hours. No results for input(s): LIPASE, AMYLASE in the last 72 hours. CBC: Recent Labs    02/16/24 0636 02/17/24 0344  WBC 9.0 8.2  HGB 11.9* 12.4  HCT 36.9 38.6  MCV 81.3 81.6  PLT 242 238   Cardiac Enzymes: Recent  Labs    02/15/24 0214 02/15/24 0435  TROPONINIHS 818* 1,181*   BNP: No results for input(s): BNP in the last 72 hours. D-Dimer: No results for input(s): DDIMER in the last 72 hours. Hemoglobin A1C: No results for input(s): HGBA1C in the last 72 hours. Fasting Lipid Panel: No results for input(s): CHOL, HDL, LDLCALC, TRIG, CHOLHDL, LDLDIRECT in the last 72 hours. Thyroid  Function Tests: No results for input(s): TSH, T4TOTAL, T3FREE, THYROIDAB in the last 72 hours.  Invalid input(s): FREET3 Anemia Panel: No results for input(s): VITAMINB12, FOLATE, FERRITIN, TIBC, IRON, RETICCTPCT in the last 72 hours.   Radiology: CARDIAC CATHETERIZATION Addendum Date: 02/15/2024   [place] Cx to Prox Cx lesion is 85% stenosed.   Prox Cx to Mid Cx lesion is 100% stenosed.   Prox LAD lesion is 70% stenosed.   Prox LAD to Mid LAD lesion is 90% stenosed.   Ost RCA to Prox RCA lesion is 100% stenosed.   Origin lesion is 100% stenosed.   Mid Graft lesion is 100% stenosed.   Dist LAD lesion is 80% stenosed.   Origin to Prox Graft lesion is 100% stenosed.   The left ventricular systolic function is normal.   LV end diastolic pressure is normal.   The left ventricular ejection fraction is 50-55% by visual estimate. 1.  Severe three-vessel coronary artery disease with 90% stenosis proximal/mid LAD, 85% stenosis ostial/proximal left circumflex, 100% stenosis proximal/mid left circumflex, 100% stenosis ostial RCA. Ipsi-collaterals from OM1-OM2/OM 3, contra collaterals from LAD/septal perforators to distal RCA 2.  Chronically occluded SVG to distal RCA, chronically occluded SVG to D1, occluded SVG to OM 2, patent LIMA to distal LAD 3.  Normal left ventricular function Recommendations 1.  Medical therapy 2.  Maximize medical therapy 3.  Aggressive risk factor modification  Result Date: 02/15/2024   Ost Cx to Prox Cx lesion is 85% stenosed.   Prox Cx to Mid Cx lesion is 100% stenosed.    Prox LAD lesion is 70% stenosed.   Prox LAD to Mid LAD lesion is 90% stenosed.   Ost RCA to Prox RCA lesion is 100% stenosed.   Origin lesion is 100% stenosed.   Mid Graft lesion is 100% stenosed.   Dist LAD lesion is 80% stenosed.   The left ventricular systolic function is normal.   LV end diastolic pressure is normal.   The left ventricular ejection fraction is 50-55% by visual estimate. 1.  Severe three-vessel coronary artery disease with 90% stenosis proximal/mid LAD, 85% stenosis ostial/proximal left circumflex, 100% stenosis proximal/mid left circumflex, 100% stenosis ostial RCA. Ipsi-collaterals from OM1-OM2/OM 3, contra collaterals from LAD/septal perforators to distal RCA 2.  Chronically occluded SVG to distal RCA, occluded SVG to OM 2, patent LIMA to distal LAD 3.  Normal left ventricular function Recommendations 1.  Medical therapy 2.  Maximize medical therapy 3.  Aggressive risk factor modification   DG Chest 2 View Result Date: 02/15/2024 CLINICAL DATA:  Chest pain EXAM: CHEST - 2 VIEW COMPARISON:  02/09/2024 FINDINGS: Cardiac shadow is enlarged but stable. Postsurgical changes are again seen. The lungs are well aerated bilaterally. Basilar atelectasis is noted bilaterally. No focal infiltrate or effusion is seen. IMPRESSION:  Bibasilar atelectatic changes. Electronically Signed   By: Oneil Devonshire M.D.   On: 02/15/2024 02:34   ECHOCARDIOGRAM COMPLETE Result Date: 02/11/2024    ECHOCARDIOGRAM REPORT   Patient Name:   Felicia Woods Date of Exam: 02/10/2024 Medical Rec #:  [id number]      Height:       67.0 in Accession #:    [id number]     Weight:       223.0 lb Date of Birth:  [birth year]     BSA:          2.118 m Patient Age:    67 years       BP:           102/84 mmHg Patient Gender: F              HR:           61 bpm. Exam Location:  [facility] Procedure: 2D Echo, Cardiac Doppler and Color Doppler (Both Spectral and Color            Flow Doppler were utilized during procedure). Indications:     Chest  Pain R07.9  History:         Patient has prior history of Echocardiogram examinations, most                  recent 04/07/2022. CHF, Previous Myocardial Infarction, Acute                  MI, CAD and Angina, Prior CABG, Signs/Symptoms:Shortness of                  Breath; Risk Factors:Diabetes and Dyslipidemia.  Sonographer:     Thea Norlander RCS Referring Phys:  [id number] DORENE COMFORT Diagnosing Phys: Cara JONETTA Lovelace MD IMPRESSIONS  1. Left ventricular ejection fraction, by estimation, is 55 to 60%. The left ventricle has normal function. The left ventricle has no regional wall motion abnormalities. Left ventricular diastolic parameters were normal.  2. Right ventricular systolic function is normal. The right ventricular size is normal.  3. The mitral valve is normal in structure. Trivial mitral valve regurgitation.  4. The aortic valve is normal in structure. Aortic valve regurgitation is not visualized. FINDINGS  Left Ventricle: Left ventricular ejection fraction, by estimation, is 55 to 60%. The left ventricle has normal function. The left ventricle has no regional wall motion abnormalities. Strain was performed and the global longitudinal strain is indeterminate. The left ventricular internal cavity size was normal in size. There is no left ventricular hypertrophy. Left ventricular diastolic parameters were normal. Right Ventricle: The right ventricular size is normal. No increase in right ventricular wall thickness. Right ventricular systolic function is normal. Left Atrium: Left atrial size was normal in size. Right Atrium: Right atrial size was normal in size. Pericardium: There is no evidence of pericardial effusion. Mitral Valve: The mitral valve is normal in structure. Trivial mitral valve regurgitation. Tricuspid Valve: The tricuspid valve is normal in structure. Tricuspid valve regurgitation is not demonstrated. Aortic Valve: The aortic valve is normal in structure. Aortic valve regurgitation is  not visualized. Aortic valve peak gradient measures 4.2 mmHg. Pulmonic Valve: The pulmonic valve was normal in structure. Pulmonic valve regurgitation is not visualized. Aorta: The ascending aorta was not well visualized. IAS/Shunts: No atrial level shunt detected by color flow Doppler. Additional Comments: 3D was performed not requiring image post processing on an independent workstation and was indeterminate.  LEFT VENTRICLE PLAX 2D LVIDd:  3.80 cm   Diastology LVIDs:         2.70 cm   LV e' medial:    4.24 cm/s LV PW:         1.05 cm   LV E/e' medial:  15.4 LV IVS:        0.80 cm   LV e' lateral:   7.72 cm/s LVOT diam:     1.90 cm   LV E/e' lateral: 8.5 LV SV:         37 LV SV Index:   17 LVOT Area:     2.84 cm  RIGHT VENTRICLE            IVC RV S prime:     4.20 cm/s  IVC diam: 1.60 cm LEFT ATRIUM         Index LA diam:    3.55 cm 1.68 cm/m  AORTIC VALVE AV Area (Vmax): 2.20 cm AV Vmax:        102.00 cm/s AV Peak Grad:   4.2 mmHg LVOT Vmax:      79.10 cm/s LVOT Vmean:     49.800 cm/s LVOT VTI:       0.129 m  AORTA Ao Root diam: 2.90 cm Ao Asc diam:  3.00 cm MITRAL VALVE MV Area (PHT): 3.99 cm    SHUNTS MV Decel Time: 190 msec    Systemic VTI:  0.13 m MV E velocity: 65.50 cm/s  Systemic Diam: 1.90 cm MV A velocity: 56.60 cm/s MV E/A ratio:  1.16 Dwayne JONETTA Lovelace MD Electronically signed by Cara JONETTA Lovelace MD Signature Date/Time: 02/11/2024/11:13:05 AM    Final    DG Chest Port 1 View Result Date: 02/09/2024 CLINICAL DATA:  Chest pain EXAM: PORTABLE CHEST 1 VIEW COMPARISON:  04/06/2022 FINDINGS: Stable cardiomediastinal silhouette. Aortic atherosclerotic calcification. Sternotomy. Bibasilar airspace opacities favor atelectasis. No pleural effusion or pneumothorax. IMPRESSION: Bibasilar airspace opacities favor atelectasis. Electronically Signed   By: Norman Gatlin M.D.   On: 02/09/2024 22:14    ECHO as above  TELEMETRY reviewed by me 02/17/2024: sinus rhythm, rate 60s  EKG reviewed by me:   Trops elevated and trended 818 > 1181. EKG with sinus rhythm, rate 51 bpm with RBBB.  Data reviewed by me 02/17/2024: last 24h vitals tele labs imaging I/O hospitalist progress notes.  Principal Problem:   Chest pain Active Problems:   STEMI 10/18/15   Hx of CABG 2010   Diabetes mellitus (HCC)   Morbid obesity (HCC)   Essential hypertension   Dyslipidemia    ASSESSMENT AND PLAN:  Felicia Woods is a 68 y.o. female  with a past medical history of coronary artery disease s/p CABGx4, multiple stents, chronic angina, hypertension, hyperlipidemia, type 2 diabetes mellitus, chronic HFpEF, obesity, asthma, COPD  who presented to the ED on 02/15/2024 for recurrent chest pressure/heaviness. Patient had recent diagnostic LHC on 08/12 that revealed severe 3-vessel CAD with chronically occluded SVG to distal RCA, chronically occluded SVG to D1 and occluded SVG to OM2, contra collaterals from LAD/septal perforators to distal RCA, no intervention performed and optimized medical management. Patient was discharged on 08/12. Patient states at home while in bed she developed severe chest pressure that radiates to left arm associated with diaphoresis and SOB. Patient reported to ED due to recurrent/severe chest pressure. Cardiology was consulted for further evaluation.   # Chronic Angina # Coronary artery disease s/p CABG x 4, multiple stents # Hypertension # Hyperlipidemia  # Elevated troponin, in setting of  chronically occluded grafts -Will discontinue heparin  today. -Continue PRN IV morphine  for severe chest pressure/pain. Will discuss plan for outpatient pain management with primary team. -Continue aspirin  81 mg, plavix  75 mg daily.  -Continue atorvastatin  80 mg daily. Not at goal so will add zetia  10 mg daily and repatha  injections every 2 weeks (PA approved 02/17/2024 - copay $4.80/mo). -Continulosartan  50 mg daily. Amlodipine  discontinued due to hypotension. -Continue home Imdur  120 mg twice daily and  Ranexa  1000 mg twice daily -Continue home metoprolol  succinate 100 mg daily. Uptitration limited by HR.  -Discussed case with [facility] interventionists and recommend to continued with medical management. Acute intervention can cause more harm at this time. Ongoing discussions about [facility] reviewing all prior heart catheterizations and CABG procedure to potentially intervene in the future, this can be discussed at outpatient follow-up.    # Chronic HFpEF Appears euvolemic on exam. -Continue home lasix  40 mg daily   -Continue home dapagliflozin  25 mg daily. -Losartan , metoprolol  as stated above.  Ok for discharge today from a cardiac perspective. Will arrange for follow up in clinic with Dr. [place] in 1-2 weeks.    This patient's plan of care was discussed and created with Dr. Ammon and he is in agreement.  Signed: Danita Bloch, PA-C  02/17/2024, 9:36 AM [facility]

## 2024-02-17 NOTE — Telephone Encounter (Signed)
 Patient Product/process development scientist completed.    The patient is insured through [facility]. Patient has Medicare and is not eligible for a copay card, but may be able to apply for patient assistance or Medicare RX Payment Plan (Patient Must reach out to their plan, if eligible for payment plan), if available.    Ran test claim for Repatha  Sureclick 140 mg/ml and Requires Prior Authorization   This test claim was processed through [employer]- copay amounts may vary at other pharmacies due to [employer], or as the patient moves through the different stages of their insurance plan.     Reyes Sharps, CPHT Pharmacy Technician III Certified Patient Advocate [facility] Pharm[facility] Patient Advocate Team Direct Number: [phone]  Fax: [phone]

## 2024-02-17 NOTE — TOC CM/SW Note (Signed)
 Transition of Care [facility]) - Inpatient Brief Assessment   Patient Details  Name: Felicia Woods MRN: [id number] Date of Birth: [birth year]  Transition of Care [facility]) CM/SW Contact:    Lauraine JAYSON Carpen, LCSW Phone Number: 02/17/2024, 10:42 AM   Clinical Narrative: Patient has orders to discharge home today. Chart reviewed. SDOH flag for housing. Resources added to AVS. No further needs. CSW signing off.  Transition of Care Asessment: Insurance and Status: Insurance coverage has been reviewed Patient has primary care physician: Yes Home environment has been reviewed: Single family home Prior level of function:: Not documented Prior/Current Home Services: No current home services Social Drivers of Health Review: SDOH reviewed interventions complete Readmission risk has been reviewed: Yes Transition of care needs: no transition of care needs at this time

## 2024-02-17 NOTE — Progress Notes (Signed)
 Pt is c/o nausea, no prn medication for nausea. Notify provider and awaiting to place order. No other concern at the moment. Plan of care continued.

## 2024-02-17 NOTE — Progress Notes (Signed)
 PHARMACY - ANTICOAGULATION CONSULT NOTE  Pharmacy Consult for Heparin   Indication: chest pain/ACS  Allergies  Allergen Reactions   Penicillin G Anaphylaxis and Other (See Comments)    Has patient had a PCN reaction causing immediate rash, facial/tongue/throat swelling, SOB or lightheadedness with hypotension: [id number]} Has patient had a PCN reaction causing severe rash involving mucus membranes or skin necrosis: no:[id number]} Has patient had a PCN reaction that required hospitalization no:[id number]} Has patient had a PCN reaction occurring within the last 10 years: no:[id number]} If all of the above answers are NO, then may proceed with Cephalosporin use.   [place] (Diagnostic)     Patient Measurements: Height: 5' 7 (170.2 cm) Weight: 107.3 kg (236 lb 8 oz) IBW/kg (Calculated) : 61.6 HEPARIN  DW (KG): 86.1  Vital Signs: Temp: 97.5 F (36.4 C) (08/15 0355) Temp Source: Oral (08/15 0355) BP: 97/71 (08/15 0355) Pulse Rate: 58 (08/15 0355)  Labs: Recent Labs    02/15/24 0214 02/15/24 0435 02/15/24 1007 02/15/24 1600 02/16/24 0636 02/17/24 0344  HGB 12.5  --   --   --  11.9* 12.4  HCT 39.4  --   --   --  36.9 38.6  PLT 283  --   --   --  242 238  HEPARINUNFRC  --   --    < > 0.58 0.55 0.20*  CREATININE 0.92  --   --   --  0.85  --   TROPONINIHS 818* 1,181*  --   --   --   --    < > = values in this interval not displayed.    Estimated Creatinine Clearance: 82.1 mL/min (by C-G formula based on SCr of 0.85 mg/dL).   Medical History: Past Medical History:  Diagnosis Date   Asthma    Coronary artery disease    Diabetes mellitus without complication (HCC)    Heart attack (HCC)    Hyperlipidemia    Hypertension    MI (myocardial infarction) (HCC)    Migraine headache with aura    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    Medications:  Medications Prior to Admission  Medication Sig Dispense Refill Last Dose/Taking   aspirin  EC 81 MG tablet Take 81 mg by mouth  daily.   02/14/2024   atorvastatin  (LIPITOR ) 40 MG tablet Take 2 tablets (80 mg total) by mouth daily. 90 tablet 3 02/14/2024   Budeson-Glycopyrrol-Formoterol  (BREZTRI  AEROSPHERE) 160-9-4.8 MCG/ACT AERO Inhale 2 puffs into the lungs 2 (two) times daily. 10.7 g 11 02/14/2024   clopidogrel  (PLAVIX ) 75 MG tablet Take 1 tablet (75 mg total) by mouth daily. 90 tablet 0 02/14/2024 Morning   empagliflozin  (JARDIANCE ) 25 MG TABS tablet Take one tab a day for diabetes 90 tablet 3 02/14/2024   escitalopram  (LEXAPRO ) 10 MG tablet Take 1 tablet (10 mg total) by mouth daily. 30 tablet 2 02/14/2024   famotidine  (PEPCID ) 20 MG tablet Take 1 tablet (20 mg total) by mouth 2 (two) times daily. 90 tablet 1 02/14/2024   furosemide  (LASIX ) 40 MG tablet Take 1 tablet (40 mg total) by mouth daily. 180 tablet 1 02/14/2024   hydrALAZINE  (APRESOLINE ) 10 MG tablet Take 1 tablet (10 mg total) by mouth 3 (three) times daily. 90 tablet 2 02/14/2024   isosorbide  mononitrate (IMDUR ) 120 MG 24 hr tablet Take 1 tablet (120 mg total) by mouth 2 (two) times daily. 60 tablet 1 02/14/2024   losartan  (COZAAR ) 100 MG tablet Take 0.5 tablets (50 mg total) by mouth daily. 90  tablet 3 02/14/2024   methocarbamol  (ROBAXIN ) 500 MG tablet Take 500 mg by mouth 3 (three) times daily as needed for muscle spasms.   Taking As Needed   metoprolol  succinate (TOPROL -XL) 100 MG 24 hr tablet Take 1 tablet (100 mg total) by mouth daily. 30 tablet 1 02/14/2024   montelukast  (SINGULAIR ) 10 MG tablet Take 1 tablet (10 mg total) by mouth at bedtime. 90 tablet 1 02/14/2024   nitroGLYCERIN  (NITROSTAT ) 0.4 MG SL tablet DISSOLVE ONE TABLET UNDER THE TONGUE EVERY 5 MINUTES AS NEEDED FOR CHEST PAIN.  DO NOT EXCEED A TOTAL OF 3 DOSES IN 15 MINUTES 30 tablet 3 Taking   oxybutynin  (DITROPAN ) 5 MG tablet Take 1 tablet (5 mg total) by mouth daily. 90 tablet 1 02/14/2024   PROAIR  RESPICLICK 108 (90 Base) MCG/ACT AEPB Inhale 2 puffs into the lungs every 6 (six) hours as needed. 3 each 1  Taking As Needed   ranolazine  (RANEXA ) 1000 MG SR tablet Take 1 tablet (1,000 mg total) by mouth 2 (two) times daily. 60 tablet 2 02/14/2024   tirzepatide  (MOUNJARO ) 2.5 MG/0.5ML Pen Inject 2.5 mg into the skin once a week. 2 mL 2 02/03/2024   traMADol  (ULTRAM ) 50 MG tablet Take 50 mg by mouth every 6 (six) hours as needed. for pain   Taking As Needed   zolpidem  (AMBIEN ) 5 MG tablet Take 1 tablet (5 mg total) by mouth at bedtime as needed. for sleep 30 tablet 2 Taking As Needed   Blood Glucose Monitoring Suppl (ONETOUCH VERIO FLEX SYSTEM) w/Device KIT Use as directed twice a daily DX E11.65 1 kit 0    glucose blood (ONETOUCH VERIO) test strip Use as instructed twice daily DX E11.65 100 each 3    Misc. Devices (ADJUST BATH/SHOWER SEAT) MISC 1 shower seat for use at home to reduce risk of falling. 1 each 0    OneTouch Delica Lancets 30G MISC USE 1  TO CHECK GLUCOSE TWICE DAILY AS DIRECTED 100 each 0     Assessment: Pharmacy consulted to dose heparin  in this 68 year old female admitted with ACS/NSTEMI.   No prior anticoag noted.  CrCl = 75.9 ml/min   Goal of Therapy:  Heparin  level 0.3-0.7 units/ml Monitor platelets by anticoagulation protocol: Yes  8/13@1007 : HL 0.56, therapeutic x 1 8/13@1600 : HL 0.58, therapeutic x 2 8/14@0636 : HL 0.55, therapeutic  8/15@0344 : HL 0.20, SUBtherapeutic    Plan:  8/15:  HL @ 0344 = 0.20, SUBtherapeutic - will order heparin  1300 units IV X 1 bolus and increase drip rate to 1350 units/hr - will recheck HL 6 hrs after rate change  Continue to monitor H&H and platelets  Zandrea Kenealy D, PharmD Clinical Pharmacist 02/17/2024 4:43 AM

## 2024-02-17 NOTE — Discharge Summary (Signed)
 Physician Discharge Summary   Patient: Felicia Woods MRN: [id number] DOB: [birth year]  Admit date:     02/15/2024  Discharge date: 02/17/24  Discharge Physician: AIDA CHO   PCP: Kristina Tinnie POUR, PA-C   Recommendations at discharge:   Follow-up with PCP in 1 week Outpatient follow-up with cardiologist (office will call to schedule appointment)  Discharge Diagnoses: Principal Problem:   Chest pain Active Problems:   Essential hypertension   Dyslipidemia   STEMI 10/18/15   Hx of CABG 2010   Diabetes mellitus (HCC)   Morbid obesity (HCC)  Resolved Problems:   * No resolved hospital problems. *  Hospital Course:   Felicia Woods is a 68 y.o. female  with medical history significant for CAD s/p CABG x 4 with multiple stents, COPD, obesity, diastolic dysfunction with normal ejection fraction who was admitted to [facility] for chest pain and was discharged on 02/14/2024 after being treated for ACS, s/p left heart catheterization and advised for medical management.  Patient started having chest pain when she reached home, which was severe 8-9/10, retrosternal, nonradiating, not associated with nausea or vomiting.     ED Course: Upon arrival to the ED, patient is found to have severe chest pain with elevated troponin at 818 and 1181.  She was started on heparin  drip and hospitalist service was consulted for evaluation for admission.   Assessment and Plan:   Chronic angina, chest pain, CAD s/p CABG x 4, multiple stents, elevated troponins: Analgesics as needed for pain. Suspect musculoskeletal chest pain as well.  Oxycodone  added for pain control. Recommended outpatient follow-up at the pain clinic for ongoing pain management. Continue Ranexa , Imdur , metoprolol , aspirin , Plavix  and Lipitor .   Recent left heart cath report on 02/14/2024:   1.  Severe three-vessel coronary artery disease with 90% stenosis proximal/mid LAD, 85% stenosis ostial/proximal  left circumflex, 100% stenosis proximal/mid left circumflex, 100% stenosis ostial RCA. Ipsi-collaterals from OM1-OM2/OM 3, contra collaterals from LAD/septal perforators to distal RCA 2.  Chronically occluded SVG to distal RCA, chronically occluded SVG to D1, occluded SVG to OM 2, patent LIMA to distal LAD 3.  Normal left ventricular function   Recommendations   1.  Medical therapy 2.  Maximize medical therapy 3.  Aggressive risk factor modification     Hypertension: Continue antihypertensives Chronic diastolic CHF: Been treated.  Continue antihypertensives continue Lasix      Type II DM: Continue Jardiance  and NovoLog  as needed for hyperglycemia     Hyperlipidemia: Continue Lipitor    Her condition has improved and she is deemed stable for discharge home today.  She wants to return to work on 02/23/2024.  Work note was provided.      Pain control - [place]  Controlled Substance Reporting System database was reviewed. and patient was instructed, not to drive, operate heavy machinery, perform activities at heights, swimming or participation in water  activities or provide baby-sitting services while on Pain, Sleep and Anxiety Medications; until their outpatient Physician has advised to do so again. Also recommended to not to take more than prescribed Pain, Sleep and Anxiety Medications.  Consultants: Cardiologist Procedures performed: None Disposition: Home Diet recommendation:  Discharge Diet Orders (From admission, onward)     Start     Ordered   02/17/24 0000  Diet - low sodium heart healthy        02/17/24 1003           Cardiac and Carb modified diet DISCHARGE MEDICATION: Allergies as of 02/17/2024  Reactions   Penicillin G Anaphylaxis, Other (See Comments)   Has patient had a PCN reaction causing immediate rash, facial/tongue/throat swelling, SOB or lightheadedness with hypotension: bzd:[id number]} Has patient had a PCN reaction causing severe rash involving  mucus membranes or skin necrosis: no:[id number]} Has patient had a PCN reaction that required hospitalization no:[id number]} Has patient had a PCN reaction occurring within the last 10 years: no:[id number]} If all of the above answers are NO, then may proceed with Cephalosporin use.   Strawberry (diagnostic)         Medication List     STOP taking these medications    methocarbamol  500 MG tablet Commonly known as: ROBAXIN    traMADol  50 MG tablet Commonly known as: ULTRAM        TAKE these medications    acetaminophen  325 MG tablet Commonly known as: TYLENOL  Take 2 tablets (650 mg total) by mouth every 6 (six) hours as needed for mild pain (pain score 1-3) (or Fever >/= 101).   Adjust Bath/Shower Seat Misc 1 shower seat for use at home to reduce risk of falling.   aspirin  EC 81 MG tablet Take 81 mg by mouth daily.   atorvastatin  40 MG tablet Commonly known as: LIPITOR  Take 2 tablets (80 mg total) by mouth daily.   Breztri  Aerosphere 160-9-4.8 MCG/ACT Aero inhaler Generic drug: budesonide -glycopyrrolate -formoterol  Inhale 2 puffs into the lungs 2 (two) times daily.   clopidogrel  75 MG tablet Commonly known as: PLAVIX  Take 1 tablet (75 mg total) by mouth daily.   empagliflozin  25 MG Tabs tablet Commonly known as: Jardiance  Take one tab a day for diabetes   escitalopram  10 MG tablet Commonly known as: Lexapro  Take 1 tablet (10 mg total) by mouth daily.   ezetimibe  10 MG tablet Commonly known as: ZETIA  Take 1 tablet (10 mg total) by mouth daily. Start taking on: February 18, 2024   famotidine  20 MG tablet Commonly known as: PEPCID  Take 1 tablet (20 mg total) by mouth 2 (two) times daily.   furosemide  40 MG tablet Commonly known as: LASIX  Take 1 tablet (40 mg total) by mouth daily.   hydrALAZINE  10 MG tablet Commonly known as: APRESOLINE  Take 1 tablet (10 mg total) by mouth 3 (three) times daily.   isosorbide  mononitrate 120 MG 24 hr tablet Commonly known  as: IMDUR  Take 1 tablet (120 mg total) by mouth 2 (two) times daily.   losartan  100 MG tablet Commonly known as: COZAAR  Take 0.5 tablets (50 mg total) by mouth daily.   metoprolol  succinate 100 MG 24 hr tablet Commonly known as: TOPROL -XL Take 1 tablet (100 mg total) by mouth daily.   montelukast  10 MG tablet Commonly known as: SINGULAIR  Take 1 tablet (10 mg total) by mouth at bedtime.   nitroGLYCERIN  0.4 MG SL tablet Commonly known as: NITROSTAT  DISSOLVE ONE TABLET UNDER THE TONGUE EVERY 5 MINUTES AS NEEDED FOR CHEST PAIN.  DO NOT EXCEED A TOTAL OF 3 DOSES IN 15 MINUTES   OneTouch Delica Lancets 30G Misc USE 1  TO CHECK GLUCOSE TWICE DAILY AS DIRECTED   OneTouch Verio Flex System w/Device Kit Use as directed twice a daily DX E11.65   OneTouch Verio test strip Generic drug: glucose blood Use as instructed twice daily DX E11.65   oxybutynin  5 MG tablet Commonly known as: DITROPAN  Take 1 tablet (5 mg total) by mouth daily.   oxyCODONE  5 MG immediate release tablet Commonly known as: Oxy IR/ROXICODONE  Take 1 tablet (5 mg total) by mouth every 8 (  eight) hours as needed for moderate pain (pain score 4-6).   ProAir  RespiClick 108 (90 Base) MCG/ACT Aepb Generic drug: Albuterol  Sulfate Inhale 2 puffs into the lungs every 6 (six) hours as needed.   ranolazine  1000 MG SR tablet Commonly known as: RANEXA  Take 1 tablet (1,000 mg total) by mouth 2 (two) times daily.   Repatha  SureClick 140 MG/ML Soaj Generic drug: Evolocumab  Inject 140 mg into the skin every 14 (fourteen) days.   tirzepatide  2.5 MG/0.5ML Pen Commonly known as: MOUNJARO  Inject 2.5 mg into the skin once a week.   zolpidem  5 MG tablet Commonly known as: AMBIEN  Take 1 tablet (5 mg total) by mouth at bedtime as needed. for sleep        Follow-up Information     Florencio Cara BIRCH, MD. Go in 1 week(s).   Specialties: Cardiology, Internal Medicine Contact information: [address] [place]  [address] [phone]                Discharge Exam: Felicia Woods   02/15/24 0210  Weight: 107.3 kg   GEN: NAD SKIN: Warm and dry EYES: No pallor or icterus ENT: MMM CV: RRR PULM: CTA B ABD: soft, ND, NT, +BS CNS: AAO x 3, non focal EXT: No edema or tenderness MSK: Left upper anterior chest wall tenderness  Condition at discharge: good  The results of significant diagnostics from this hospitalization (including imaging, microbiology, ancillary and laboratory) are listed below for reference.   Imaging Studies: CARDIAC CATHETERIZATION Addendum Date: 02/15/2024   Lida Cx to Prox Cx lesion is 85% stenosed.   Prox Cx to Mid Cx lesion is 100% stenosed.   Prox LAD lesion is 70% stenosed.   Prox LAD to Mid LAD lesion is 90% stenosed.   Ost RCA to Prox RCA lesion is 100% stenosed.   Origin lesion is 100% stenosed.   Mid Graft lesion is 100% stenosed.   Dist LAD lesion is 80% stenosed.   Origin to Prox Graft lesion is 100% stenosed.   The left ventricular systolic function is normal.   LV end diastolic pressure is normal.   The left ventricular ejection fraction is 50-55% by visual estimate. 1.  Severe three-vessel coronary artery disease with 90% stenosis proximal/mid LAD, 85% stenosis ostial/proximal left circumflex, 100% stenosis proximal/mid left circumflex, 100% stenosis ostial RCA. Ipsi-collaterals from OM1-OM2/OM 3, contra collaterals from LAD/septal perforators to distal RCA 2.  Chronically occluded SVG to distal RCA, chronically occluded SVG to D1, occluded SVG to OM 2, patent LIMA to distal LAD 3.  Normal left ventricular function Recommendations 1.  Medical therapy 2.  Maximize medical therapy 3.  Aggressive risk factor modification  Result Date: 02/15/2024   Ost Cx to Prox Cx lesion is 85% stenosed.   Prox Cx to Mid Cx lesion is 100% stenosed.   Prox LAD lesion is 70% stenosed.   Prox LAD to Mid LAD lesion is 90% stenosed.   Ost RCA to Prox RCA lesion is 100% stenosed.   Origin  lesion is 100% stenosed.   Mid Graft lesion is 100% stenosed.   Dist LAD lesion is 80% stenosed.   The left ventricular systolic function is normal.   LV end diastolic pressure is normal.   The left ventricular ejection fraction is 50-55% by visual estimate. 1.  Severe three-vessel coronary artery disease with 90% stenosis proximal/mid LAD, 85% stenosis ostial/proximal left circumflex, 100% stenosis proximal/mid left circumflex, 100% stenosis ostial RCA. Ipsi-collaterals from OM1-OM2/OM 3, contra  collaterals from LAD/septal perforators to distal RCA 2.  Chronically occluded SVG to distal RCA, occluded SVG to OM 2, patent LIMA to distal LAD 3.  Normal left ventricular function Recommendations 1.  Medical therapy 2.  Maximize medical therapy 3.  Aggressive risk factor modification   DG Chest 2 View Result Date: 02/15/2024 CLINICAL DATA:  Chest pain EXAM: CHEST - 2 VIEW COMPARISON:  02/09/2024 FINDINGS: Cardiac shadow is enlarged but stable. Postsurgical changes are again seen. The lungs are well aerated bilaterally. Basilar atelectasis is noted bilaterally. No focal infiltrate or effusion is seen. IMPRESSION: Bibasilar atelectatic changes. Electronically Signed   By: Oneil Devonshire M.D.   On: 02/15/2024 02:34   ECHOCARDIOGRAM COMPLETE Result Date: 02/11/2024    ECHOCARDIOGRAM REPORT   Patient Name:   Felicia Woods Date of Exam: 02/10/2024 Medical Rec #:  [id number]      Height:       67.0 in Accession #:    [id number]     Weight:       223.0 lb Date of Birth:  03/25/1956     BSA:          2.118 m Patient Age:    67 years       BP:           102/84 mmHg Patient Gender: F              HR:           61 bpm. Exam Location:  [facility] Procedure: 2D Echo, Cardiac Doppler and Color Doppler (Both Spectral and Color            Flow Doppler were utilized during procedure). Indications:     Chest Pain R07.9  History:         Patient has prior history of Echocardiogram examinations, most                  recent 04/07/2022. CHF,  Previous Myocardial Infarction, Acute                  MI, CAD and Angina, Prior CABG, Signs/Symptoms:Shortness of                  Breath; Risk Factors:Diabetes and Dyslipidemia.  Sonographer:     Thea Norlander RCS Referring Phys:  [id number] DORENE COMFORT Diagnosing Phys: Cara JONETTA Lovelace MD IMPRESSIONS  1. Left ventricular ejection fraction, by estimation, is 55 to 60%. The left ventricle has normal function. The left ventricle has no regional wall motion abnormalities. Left ventricular diastolic parameters were normal.  2. Right ventricular systolic function is normal. The right ventricular size is normal.  3. The mitral valve is normal in structure. Trivial mitral valve regurgitation.  4. The aortic valve is normal in structure. Aortic valve regurgitation is not visualized. FINDINGS  Left Ventricle: Left ventricular ejection fraction, by estimation, is 55 to 60%. The left ventricle has normal function. The left ventricle has no regional wall motion abnormalities. Strain was performed and the global longitudinal strain is indeterminate. The left ventricular internal cavity size was normal in size. There is no left ventricular hypertrophy. Left ventricular diastolic parameters were normal. Right Ventricle: The right ventricular size is normal. No increase in right ventricular wall thickness. Right ventricular systolic function is normal. Left Atrium: Left atrial size was normal in size. Right Atrium: Right atrial size was normal in size. Pericardium: There is no evidence of pericardial effusion. Mitral Valve: The mitral valve is normal in structure.  Trivial mitral valve regurgitation. Tricuspid Valve: The tricuspid valve is normal in structure. Tricuspid valve regurgitation is not demonstrated. Aortic Valve: The aortic valve is normal in structure. Aortic valve regurgitation is not visualized. Aortic valve peak gradient measures 4.2 mmHg. Pulmonic Valve: The pulmonic valve was normal in structure. Pulmonic  valve regurgitation is not visualized. Aorta: The ascending aorta was not well visualized. IAS/Shunts: No atrial level shunt detected by color flow Doppler. Additional Comments: 3D was performed not requiring image post processing on an independent workstation and was indeterminate.  LEFT VENTRICLE PLAX 2D LVIDd:         3.80 cm   Diastology LVIDs:         2.70 cm   LV e' medial:    4.24 cm/s LV PW:         1.05 cm   LV E/e' medial:  15.4 LV IVS:        0.80 cm   LV e' lateral:   7.72 cm/s LVOT diam:     1.90 cm   LV E/e' lateral: 8.5 LV SV:         37 LV SV Index:   17 LVOT Area:     2.84 cm  RIGHT VENTRICLE            IVC RV S prime:     4.20 cm/s  IVC diam: 1.60 cm LEFT ATRIUM         Index LA diam:    3.55 cm 1.68 cm/m  AORTIC VALVE AV Area (Vmax): 2.20 cm AV Vmax:        102.00 cm/s AV Peak Grad:   4.2 mmHg LVOT Vmax:      79.10 cm/s LVOT Vmean:     49.800 cm/s LVOT VTI:       0.129 m  AORTA Ao Root diam: 2.90 cm Ao Asc diam:  3.00 cm MITRAL VALVE MV Area (PHT): 3.99 cm    SHUNTS MV Decel Time: 190 msec    Systemic VTI:  0.13 m MV E velocity: 65.50 cm/s  Systemic Diam: 1.90 cm MV A velocity: 56.60 cm/s MV E/A ratio:  1.16 Dwayne JONETTA Lovelace MD Electronically signed by Cara JONETTA Lovelace MD Signature Date/Time: 02/11/2024/11:13:05 AM    Final    DG Chest Port 1 View Result Date: 02/09/2024 CLINICAL DATA:  Chest pain EXAM: PORTABLE CHEST 1 VIEW COMPARISON:  04/06/2022 FINDINGS: Stable cardiomediastinal silhouette. Aortic atherosclerotic calcification. Sternotomy. Bibasilar airspace opacities favor atelectasis. No pleural effusion or pneumothorax. IMPRESSION: Bibasilar airspace opacities favor atelectasis. Electronically Signed   By: Norman Gatlin M.D.   On: 02/09/2024 22:14    Microbiology: Results for orders placed or performed in visit on 11/24/23  CULTURE, URINE COMPREHENSIVE     Status: None   Collection Time: 11/24/23  4:19 PM   Specimen: Urine   Urine  Result Value Ref Range Status   Urine  Culture, Comprehensive Final report  Final   Organism ID, Bacteria Comment  Final    Comment: Mixed urogenital flora 10,000-25,000 colony forming units per mL     Labs: CBC: Recent Labs  Lab 02/12/24 0854 02/14/24 0400 02/15/24 0214 02/16/24 0636 02/17/24 0344  WBC 11.5* 8.1 8.8 9.0 8.2  HGB 12.0 11.7* 12.5 11.9* 12.4  HCT 36.6 36.7 39.4 36.9 38.6  MCV 80.3 81.9 80.7 81.3 81.6  PLT 268 240 283 242 238   Basic Metabolic Panel: Recent Labs  Lab 02/11/24 0402 02/12/24 0854 02/14/24 0400 02/15/24 0214 02/16/24 0636  NA 136 135 136 134* 135  K 4.1 4.5 4.3 4.2 3.9  CL 101 98 101 101 101  CO2 26 23 24  21* 25  GLUCOSE 171* 122* 113* 149* 114*  BUN 23 18 33* 26* 21  CREATININE 1.28* 0.81 1.15* 0.92 0.85  CALCIUM  9.1 9.4 9.4 9.6 9.0   Liver Function Tests: No results for input(s): AST, ALT, ALKPHOS, BILITOT, PROT, ALBUMIN in the last 168 hours. CBG: Recent Labs  Lab 02/16/24 0747 02/16/24 1133 02/16/24 1720 02/16/24 2033 02/17/24 0750  GLUCAP 110* 183* 118* 199* 121*    Discharge time spent: greater than 30 minutes.  Signed: AIDA CHO, MD [facility] 02/17/2024

## 2024-02-17 NOTE — Plan of Care (Signed)

## 2024-02-21 ENCOUNTER — Other Ambulatory Visit (HOSPITAL_COMMUNITY): Payer: Self-pay

## 2024-02-22 ENCOUNTER — Emergency Department

## 2024-02-22 ENCOUNTER — Other Ambulatory Visit: Payer: Self-pay

## 2024-02-22 ENCOUNTER — Inpatient Hospital Stay
Admission: EM | Admit: 2024-02-22 | Discharge: 2024-02-24 | DRG: 282 | Disposition: A | Discharge status: Home / Self Care | Attending: Internal Medicine | Admitting: Internal Medicine

## 2024-02-22 DIAGNOSIS — J45909 Unspecified asthma, uncomplicated: Secondary | ICD-10-CM | POA: Diagnosis present

## 2024-02-22 DIAGNOSIS — Z91018 Allergy to other foods: Secondary | ICD-10-CM

## 2024-02-22 DIAGNOSIS — I252 Old myocardial infarction: Secondary | ICD-10-CM | POA: Diagnosis not present

## 2024-02-22 DIAGNOSIS — Z6837 Body mass index (BMI) 37.0-37.9, adult: Secondary | ICD-10-CM | POA: Diagnosis not present

## 2024-02-22 DIAGNOSIS — E785 Hyperlipidemia, unspecified: Secondary | ICD-10-CM | POA: Diagnosis present

## 2024-02-22 DIAGNOSIS — Z955 Presence of coronary angioplasty implant and graft: Secondary | ICD-10-CM | POA: Diagnosis not present

## 2024-02-22 DIAGNOSIS — Z833 Family history of diabetes mellitus: Secondary | ICD-10-CM | POA: Diagnosis not present

## 2024-02-22 DIAGNOSIS — I2089 Other forms of angina pectoris: Secondary | ICD-10-CM | POA: Diagnosis present

## 2024-02-22 DIAGNOSIS — Z7984 Long term (current) use of oral hypoglycemic drugs: Secondary | ICD-10-CM | POA: Diagnosis not present

## 2024-02-22 DIAGNOSIS — I200 Unstable angina: Secondary | ICD-10-CM | POA: Diagnosis present

## 2024-02-22 DIAGNOSIS — E119 Type 2 diabetes mellitus without complications: Secondary | ICD-10-CM | POA: Diagnosis present

## 2024-02-22 DIAGNOSIS — Z951 Presence of aortocoronary bypass graft: Secondary | ICD-10-CM

## 2024-02-22 DIAGNOSIS — Z7951 Long term (current) use of inhaled steroids: Secondary | ICD-10-CM | POA: Diagnosis not present

## 2024-02-22 DIAGNOSIS — Z809 Family history of malignant neoplasm, unspecified: Secondary | ICD-10-CM

## 2024-02-22 DIAGNOSIS — Z7902 Long term (current) use of antithrombotics/antiplatelets: Secondary | ICD-10-CM | POA: Diagnosis not present

## 2024-02-22 DIAGNOSIS — Z7982 Long term (current) use of aspirin: Secondary | ICD-10-CM

## 2024-02-22 DIAGNOSIS — I10 Essential (primary) hypertension: Secondary | ICD-10-CM | POA: Diagnosis present

## 2024-02-22 DIAGNOSIS — I214 Non-ST elevation (NSTEMI) myocardial infarction: Secondary | ICD-10-CM | POA: Diagnosis present

## 2024-02-22 DIAGNOSIS — E66812 Obesity, class 2: Secondary | ICD-10-CM | POA: Diagnosis present

## 2024-02-22 DIAGNOSIS — Z79899 Other long term (current) drug therapy: Secondary | ICD-10-CM | POA: Diagnosis not present

## 2024-02-22 DIAGNOSIS — G8929 Other chronic pain: Secondary | ICD-10-CM | POA: Diagnosis present

## 2024-02-22 DIAGNOSIS — I2510 Atherosclerotic heart disease of native coronary artery without angina pectoris: Secondary | ICD-10-CM | POA: Diagnosis not present

## 2024-02-22 DIAGNOSIS — Z880 Allergy status to penicillin: Secondary | ICD-10-CM | POA: Diagnosis not present

## 2024-02-22 DIAGNOSIS — R079 Chest pain, unspecified: Principal | ICD-10-CM | POA: Diagnosis present

## 2024-02-22 DIAGNOSIS — I25710 Atherosclerosis of autologous vein coronary artery bypass graft(s) with unstable angina pectoris: Principal | ICD-10-CM | POA: Diagnosis present

## 2024-02-22 DIAGNOSIS — Z7985 Long-term (current) use of injectable non-insulin antidiabetic drugs: Secondary | ICD-10-CM

## 2024-02-22 LAB — CBC WITH DIFFERENTIAL/PLATELET
Abs Immature Granulocytes: 0.04 K/uL (ref 0.00–0.07)
Basophils Absolute: 0 K/uL (ref 0.0–0.1)
Basophils Relative: 0 %
Eosinophils Absolute: 0.1 K/uL (ref 0.0–0.5)
Eosinophils Relative: 1 %
HCT: 39.6 % (ref 36.0–46.0)
Hemoglobin: 12.8 g/dL (ref 12.0–15.0)
Immature Granulocytes: 0 %
Lymphocytes Relative: 32 %
Lymphs Abs: 3 K/uL (ref 0.7–4.0)
MCH: 26.3 pg (ref 26.0–34.0)
MCHC: 32.3 g/dL (ref 30.0–36.0)
MCV: 81.5 fL (ref 80.0–100.0)
Monocytes Absolute: 0.7 K/uL (ref 0.1–1.0)
Monocytes Relative: 7 %
Neutro Abs: 5.6 K/uL (ref 1.7–7.7)
Neutrophils Relative %: 60 %
Platelets: 285 K/uL (ref 150–400)
RBC: 4.86 MIL/uL (ref 3.87–5.11)
RDW: 16.4 % — ABNORMAL HIGH (ref 11.5–15.5)
WBC: 9.5 K/uL (ref 4.0–10.5)
nRBC: 0 % (ref 0.0–0.2)

## 2024-02-22 LAB — HEPARIN LEVEL (UNFRACTIONATED)
Heparin Unfractionated: 0.74 [IU]/mL — ABNORMAL HIGH (ref 0.30–0.70)
Heparin Unfractionated: 0.48 [IU]/mL (ref 0.30–0.70)

## 2024-02-22 LAB — D-DIMER, QUANTITATIVE: D-Dimer, Quant: 1.54 ug{FEU}/mL — ABNORMAL HIGH (ref 0.00–0.50)

## 2024-02-22 LAB — COMPREHENSIVE METABOLIC PANEL WITH GFR
ALT: 19 U/L (ref 0–44)
AST: 21 U/L (ref 15–41)
Albumin: 3.4 g/dL — ABNORMAL LOW (ref 3.5–5.0)
Alkaline Phosphatase: 51 U/L (ref 38–126)
Anion gap: 8 (ref 5–15)
BUN: 16 mg/dL (ref 8–23)
CO2: 25 mmol/L (ref 22–32)
Calcium: 9.1 mg/dL (ref 8.9–10.3)
Chloride: 103 mmol/L (ref 98–111)
Creatinine, Ser: 0.63 mg/dL (ref 0.44–1.00)
GFR, Estimated: >60 mL/min (ref >60)
Glucose, Bld: 191 mg/dL — ABNORMAL HIGH (ref 70–99)
Potassium: 3.8 mmol/L (ref 3.5–5.1)
Sodium: 136 mmol/L (ref 135–145)
Total Bilirubin: 0.2 mg/dL (ref 0.0–1.2)
Total Protein: 7.5 g/dL (ref 6.5–8.1)

## 2024-02-22 LAB — TROPONIN I (HIGH SENSITIVITY)
Troponin I (High Sensitivity): 115 ng/L (ref <18)
Troponin I (High Sensitivity): 181 ng/L (ref <18)

## 2024-02-22 LAB — CBG MONITORING, ED
Glucose-Capillary: 157 mg/dL — ABNORMAL HIGH (ref 70–99)
Glucose-Capillary: 87 mg/dL (ref 70–99)
Glucose-Capillary: 135 mg/dL — ABNORMAL HIGH (ref 70–99)

## 2024-02-22 LAB — MAGNESIUM: Magnesium: 1.9 mg/dL (ref 1.7–2.4)

## 2024-02-22 LAB — PROTIME-INR
INR: 1.2 (ref 0.8–1.2)
Prothrombin Time: 15.6 s — ABNORMAL HIGH (ref 11.4–15.2)

## 2024-02-22 LAB — APTT: aPTT: 34 s (ref 24–36)

## 2024-02-22 MED ORDER — IOHEXOL 350 MG/ML SOLN
75.0000 mL | Freq: Once | INTRAVENOUS | Status: AC | PRN
  Administered 2024-02-22: 75 mL via INTRAVENOUS

## 2024-02-22 MED ORDER — ASPIRIN 81 MG PO CHEW
324.0000 mg | CHEWABLE_TABLET | ORAL | Status: AC
  Administered 2024-02-22: 324 mg via ORAL
  Filled 2024-02-22: qty 4

## 2024-02-22 MED ORDER — ONDANSETRON HCL 4 MG/2ML IJ SOLN: 4.0000 mg | Freq: Four times a day (QID) | INTRAMUSCULAR | Status: DC | PRN

## 2024-02-22 MED ORDER — MORPHINE SULFATE (PF) 4 MG/ML IV SOLN
4.0000 mg | Freq: Once | INTRAVENOUS | Status: AC
  Administered 2024-02-22: 4 mg via INTRAVENOUS
  Filled 2024-02-22: qty 1

## 2024-02-22 MED ORDER — HEPARIN BOLUS VIA INFUSION
4000.0000 [IU] | Freq: Once | INTRAVENOUS | Status: AC
  Administered 2024-02-22: 4000 [IU] via INTRAVENOUS
  Filled 2024-02-22: qty 4000

## 2024-02-22 MED ORDER — INSULIN ASPART 100 UNIT/ML IJ SOLN: 0.0000 [IU] | Freq: Every day | INTRAMUSCULAR | Status: DC

## 2024-02-22 MED ORDER — EZETIMIBE 10 MG PO TABS
10.0000 mg | ORAL_TABLET | Freq: Every day | ORAL | Status: DC
  Administered 2024-02-23 – 2024-02-24 (×2): 10 mg via ORAL
  Filled 2024-02-22 (×2): qty 1

## 2024-02-22 MED ORDER — INSULIN ASPART 100 UNIT/ML IJ SOLN
0.0000 [IU] | Freq: Three times a day (TID) | INTRAMUSCULAR | Status: DC
  Administered 2024-02-22: 3 [IU] via SUBCUTANEOUS
  Administered 2024-02-23 – 2024-02-24 (×3): 2 [IU] via SUBCUTANEOUS
  Administered 2024-02-24: 3 [IU] via SUBCUTANEOUS
  Filled 2024-02-22 (×4): qty 1
  Filled 2024-02-22: qty 3

## 2024-02-22 MED ORDER — ONDANSETRON HCL 4 MG/2ML IJ SOLN
4.0000 mg | Freq: Once | INTRAMUSCULAR | Status: AC
  Administered 2024-02-22: 4 mg via INTRAVENOUS
  Filled 2024-02-22: qty 2

## 2024-02-22 MED ORDER — LOSARTAN POTASSIUM 50 MG PO TABS
50.0000 mg | ORAL_TABLET | Freq: Every day | ORAL | Status: DC
  Administered 2024-02-23: 50 mg via ORAL
  Filled 2024-02-22 (×2): qty 1

## 2024-02-22 MED ORDER — ALBUTEROL SULFATE (2.5 MG/3ML) 0.083% IN NEBU: 3.0000 mL | INHALATION_SOLUTION | Freq: Four times a day (QID) | RESPIRATORY_TRACT | Status: DC | PRN

## 2024-02-22 MED ORDER — HEPARIN (PORCINE) 25000 UT/250ML-% IV SOLN
950.0000 [IU]/h | INTRAVENOUS | Status: DC
  Administered 2024-02-22: 1050 [IU]/h via INTRAVENOUS
  Administered 2024-02-23 – 2024-02-24 (×2): 950 [IU]/h via INTRAVENOUS
  Filled 2024-02-22 (×3): qty 250

## 2024-02-22 MED ORDER — ASPIRIN 81 MG PO TBEC
81.0000 mg | DELAYED_RELEASE_TABLET | Freq: Every day | ORAL | Status: DC
  Administered 2024-02-23 – 2024-02-24 (×2): 81 mg via ORAL
  Filled 2024-02-22 (×2): qty 1

## 2024-02-22 MED ORDER — ESCITALOPRAM OXALATE 10 MG PO TABS
10.0000 mg | ORAL_TABLET | Freq: Every day | ORAL | Status: DC
  Administered 2024-02-23 – 2024-02-24 (×2): 10 mg via ORAL
  Filled 2024-02-22 (×2): qty 1

## 2024-02-22 MED ORDER — MONTELUKAST SODIUM 10 MG PO TABS
10.0000 mg | ORAL_TABLET | Freq: Every day | ORAL | Status: DC
  Administered 2024-02-22 – 2024-02-23 (×2): 10 mg via ORAL
  Filled 2024-02-22 (×2): qty 1

## 2024-02-22 MED ORDER — NITROGLYCERIN 0.4 MG SL SUBL: 0.4000 mg | SUBLINGUAL_TABLET | SUBLINGUAL | Status: DC | PRN

## 2024-02-22 MED ORDER — ACETAMINOPHEN 325 MG PO TABS
650.0000 mg | ORAL_TABLET | ORAL | Status: DC | PRN
Start: 2024-02-22 — End: 2024-02-24
  Administered 2024-02-23: 650 mg via ORAL

## 2024-02-22 MED ORDER — ATORVASTATIN CALCIUM 80 MG PO TABS
80.0000 mg | ORAL_TABLET | Freq: Every day | ORAL | Status: DC
  Administered 2024-02-23 – 2024-02-24 (×2): 80 mg via ORAL
  Filled 2024-02-22: qty 1
  Filled 2024-02-22: qty 4

## 2024-02-22 MED ORDER — RANOLAZINE ER 500 MG PO TB12
1000.0000 mg | ORAL_TABLET | Freq: Two times a day (BID) | ORAL | Status: DC
  Administered 2024-02-22 – 2024-02-24 (×4): 1000 mg via ORAL
  Filled 2024-02-22 (×5): qty 2

## 2024-02-22 MED ORDER — METOPROLOL SUCCINATE ER 100 MG PO TB24
100.0000 mg | ORAL_TABLET | Freq: Every day | ORAL | Status: DC
  Administered 2024-02-23 – 2024-02-24 (×2): 100 mg via ORAL
  Filled 2024-02-22: qty 1
  Filled 2024-02-22: qty 2

## 2024-02-22 MED ORDER — ZOLPIDEM TARTRATE 5 MG PO TABS
5.0000 mg | ORAL_TABLET | Freq: Every evening | ORAL | Status: DC | PRN
  Administered 2024-02-23: 5 mg via ORAL
  Filled 2024-02-22: qty 1

## 2024-02-22 MED ORDER — OXYCODONE HCL 5 MG PO TABS
5.0000 mg | ORAL_TABLET | Freq: Three times a day (TID) | ORAL | Status: DC | PRN
  Administered 2024-02-24: 5 mg via ORAL
  Filled 2024-02-22: qty 1

## 2024-02-22 MED ORDER — FUROSEMIDE 40 MG PO TABS
40.0000 mg | ORAL_TABLET | Freq: Every day | ORAL | Status: DC
  Administered 2024-02-23 – 2024-02-24 (×2): 40 mg via ORAL
  Filled 2024-02-22 (×2): qty 1

## 2024-02-22 MED ORDER — OXYBUTYNIN CHLORIDE 5 MG PO TABS
5.0000 mg | ORAL_TABLET | Freq: Every day | ORAL | Status: DC
  Administered 2024-02-22 – 2024-02-24 (×3): 5 mg via ORAL
  Filled 2024-02-22 (×4): qty 1

## 2024-02-22 MED ORDER — ASPIRIN 300 MG RE SUPP: 300.0000 mg | RECTAL | Status: AC

## 2024-02-22 MED ORDER — NITROGLYCERIN IN D5W 200-5 MCG/ML-% IV SOLN
0.0000 ug/min | INTRAVENOUS | Status: DC
  Administered 2024-02-22: 10 ug/min via INTRAVENOUS
  Filled 2024-02-22: qty 250

## 2024-02-22 MED ORDER — MORPHINE SULFATE (PF) 4 MG/ML IV SOLN
4.0000 mg | INTRAVENOUS | Status: DC | PRN
  Filled 2024-02-22: qty 1

## 2024-02-22 MED ORDER — FAMOTIDINE 20 MG PO TABS
20.0000 mg | ORAL_TABLET | Freq: Two times a day (BID) | ORAL | Status: DC
  Administered 2024-02-22 – 2024-02-24 (×4): 20 mg via ORAL
  Filled 2024-02-22 (×4): qty 1

## 2024-02-22 MED ORDER — BUDESON-GLYCOPYRROL-FORMOTEROL 160-9-4.8 MCG/ACT IN AERO
2.0000 | INHALATION_SPRAY | Freq: Two times a day (BID) | RESPIRATORY_TRACT | Status: DC
  Filled 2024-02-22 (×2): qty 5.9

## 2024-02-22 MED ORDER — ACETAMINOPHEN 325 MG PO TABS
650.0000 mg | ORAL_TABLET | Freq: Four times a day (QID) | ORAL | Status: DC | PRN
  Filled 2024-02-22: qty 2

## 2024-02-22 MED ORDER — CLOPIDOGREL BISULFATE 75 MG PO TABS
75.0000 mg | ORAL_TABLET | Freq: Every day | ORAL | Status: DC
  Administered 2024-02-23 – 2024-02-24 (×2): 75 mg via ORAL
  Filled 2024-02-22 (×2): qty 1

## 2024-02-22 NOTE — ED Notes (Signed)
 Patient transfered to CT for scan.

## 2024-02-22 NOTE — Progress Notes (Signed)
 PHARMACY - ANTICOAGULATION CONSULT NOTE  Pharmacy Consult for Heparin  Infusion Indication: chest pain/ACS  Allergies  Allergen Reactions   Penicillin G Anaphylaxis and Other (See Comments)    Has patient had a PCN reaction causing immediate rash, facial/tongue/throat swelling, SOB or lightheadedness with hypotension: bzd:[id number]} Has patient had a PCN reaction causing severe rash involving mucus membranes or skin necrosis: no:[id number]} Has patient had a PCN reaction that required hospitalization no:[id number]} Has patient had a PCN reaction occurring within the last 10 years: no:[id number]} If all of the above answers are NO, then may proceed with Cephalosporin use.   Strawberry (Diagnostic)     Patient Measurements: Height: 5' 7 (170.2 cm) Weight: 107.5 kg (236 lb 15.9 oz) IBW/kg (Calculated) : 61.6 HEPARIN  DW (KG): 86.1  Vital Signs: Temp: 97.8 F (36.6 C) (08/20 2130) Temp Source: Oral (08/20 2130) BP: 118/72 (08/20 2207) Pulse Rate: 61 (08/20 2207)  Labs: Recent Labs    02/22/24 0548 02/22/24 0656 02/22/24 0806 02/22/24 1455 02/22/24 2131  HGB 12.8  --   --   --   --   HCT 39.6  --   --   --   --   PLT 285  --   --   --   --   APTT  --   --  34  --   --   LABPROT  --   --  15.6*  --   --   INR  --   --  1.2  --   --   HEPARINUNFRC  --   --   --  0.74* 0.48  CREATININE  --  0.63  --   --   --   TROPONINIHS 115* 181*  --   --   --     Estimated Creatinine Clearance: 87.4 mL/min (by C-G formula based on SCr of 0.63 mg/dL).   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 Troponin elevated at 115. Of note, patient was recently discharged on 8/12 after being treated for ACS, s/p left heart catheterization and advised for medical management. She was then re-admitted 8/13 for 8-9/10 chest pain, retrosternal, non-radiating, not associated with N/V. Plan was to maximize medical therapy and aggressive risk factor modification. Pharmacy consulted to initiate patient on a heparin  infusion for ACS/STEMI.  Baseline labs: INR- ordered aPTT- ordered Hgb- 12.8 PLT- 285  No signs/symptoms of bleeding per notes.  Goal of Therapy:  Heparin  level 0.3-0.7 units/ml Monitor platelets by anticoagulation protocol: Yes   8/20@1455 : HL 0.74, supratherapeutic@1050  units/hr 8/20@2131 : HL 0.48, therapeutic x 1  Plan:  Continue heparin  infusion at 950 units/hr Check anti-Xa level 6 hours after last check and daily while on heparin  Continue to monitor H&H and platelets   Will M. Lenon, PharmD Clinical Pharmacist 02/22/2024 10:45 PM

## 2024-02-22 NOTE — Progress Notes (Signed)
 PHARMACY - ANTICOAGULATION CONSULT NOTE  Pharmacy Consult for Heparin  Infusion Indication: chest pain/ACS  Allergies  Allergen Reactions   Penicillin G Anaphylaxis and Other (See Comments)    Has patient had a PCN reaction causing immediate rash, facial/tongue/throat swelling, SOB or lightheadedness with hypotension: bzd:[id number]} Has patient had a PCN reaction causing severe rash involving mucus membranes or skin necrosis: no:[id number]} Has patient had a PCN reaction that required hospitalization no:[id number]} Has patient had a PCN reaction occurring within the last 10 years: no:[id number]} If all of the above answers are NO, then may proceed with Cephalosporin use.   Strawberry (Diagnostic)     Patient Measurements: Height: 5' 7 (170.2 cm) Weight: 107.5 kg (236 lb 15.9 oz) IBW/kg (Calculated) : 61.6 HEPARIN  DW (KG): 86.1  Vital Signs: Temp: 98.1 F (36.7 C) (08/20 0537) Temp Source: Oral (08/20 0537) BP: 170/94 (08/20 0537) Pulse Rate: 76 (08/20 0537)  Labs: Recent Labs    02/22/24 0548  HGB 12.8  HCT 39.6  PLT 285  TROPONINIHS 115*    Estimated Creatinine Clearance: 82.2 mL/min (by C-G formula based on SCr of 0.85 mg/dL).   Medical History: Past Medical History:  Diagnosis Date   Asthma    Coronary artery disease    Diabetes mellitus without complication (HCC)    Heart attack (HCC)    Hyperlipidemia    Hypertension    MI (myocardial infarction) (HCC)    Migraine headache with aura    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    Medications:  Not on anticoagulation per chart review  Assessment: Patient is a 68 year old female with a past medical history of CAD s/p CABG x 4 with multiple stents, COPD, obesity, and diastolic dysfunction with normal ejection fraction who presented to ED with chest pain that radiates down her left arm. Troponin elevated at 115. Of note, patient was recently discharged on 8/12 after being treated for ACS, s/p left heart  catheterization and advised for medical management. She was then re-admitted 8/13 for 8-9/10 chest pain, retrosternal, non-radiating, not associated with N/V. Plan was to maximize medical therapy and aggressive risk factor modification. Pharmacy consulted to initiate patient on a heparin  infusion for ACS/STEMI.  Baseline labs: INR- ordered aPTT- ordered Hgb- 12.8 PLT- 285  No signs/symptoms of bleeding per notes.  Goal of Therapy:  Heparin  level 0.3-0.7 units/ml Monitor platelets by anticoagulation protocol: Yes   Plan:  Give 4000 units bolus x 1 Start heparin  infusion at 1050 units/hr Check anti-Xa level in 6 hours and daily while on heparin  Continue to monitor H&H and platelets  Lum VEAR Mania, PharmD, BCPS 02/22/2024,7:39 AM

## 2024-02-22 NOTE — ED Notes (Signed)
 Pt waiting on admission bed.  Reusmed care from [employer].  Pt alert  family at bedside.   Sinus brady on monitor

## 2024-02-22 NOTE — Progress Notes (Signed)
 PHARMACY - ANTICOAGULATION CONSULT NOTE  Pharmacy Consult for Heparin  Infusion Indication: chest pain/ACS  Allergies  Allergen Reactions   Penicillin G Anaphylaxis and Other (See Comments)    Has patient had a PCN reaction causing immediate rash, facial/tongue/throat swelling, SOB or lightheadedness with hypotension: bzd:[id number]} Has patient had a PCN reaction causing severe rash involving mucus membranes or skin necrosis: no:[id number]} Has patient had a PCN reaction that required hospitalization no:[id number]} Has patient had a PCN reaction occurring within the last 10 years: no:[id number]} If all of the above answers are NO, then may proceed with Cephalosporin use.   [facility] (Diagnostic)     Patient Measurements: Height: 5' 7 (170.2 cm) Weight: 107.5 kg (236 lb 15.9 oz) IBW/kg (Calculated) : 61.6 HEPARIN  DW (KG): 86.1  Vital Signs: Temp: 98.3 F (36.8 C) (08/20 1358) Temp Source: Oral (08/20 1358) BP: 145/85 (08/20 1500) Pulse Rate: 52 (08/20 1500)  Labs: Recent Labs    02/22/24 0548 02/22/24 0656 02/22/24 0806 02/22/24 1455  HGB 12.8  --   --   --   HCT 39.6  --   --   --   PLT 285  --   --   --   APTT  --   --  34  --   LABPROT  --   --  15.6*  --   INR  --   --  1.2  --   HEPARINUNFRC  --   --   --  0.74*  CREATININE  --  0.63  --   --   TROPONINIHS 115* 181*  --   --     Estimated Creatinine Clearance: 87.4 mL/min (by C-G formula based on SCr of 0.63 mg/dL).   Medical History: Past Medical History:  Diagnosis Date   Asthma    Coronary artery disease    Diabetes mellitus without complication (HCC)    Heart attack (HCC)    Hyperlipidemia    Hypertension    MI (myocardial infarction) (HCC)    Migraine headache with aura    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    Medications:  Not on anticoagulation per chart review  Assessment: Patient is a 68 year old female with a past medical history of CAD s/p CABG x 4 with multiple stents, COPD, obesity, and  diastolic dysfunction with normal ejection fraction who presented to ED with chest pain that radiates down her left arm. Troponin elevated at 115. Of note, patient was recently discharged on 8/12 after being treated for ACS, s/p left heart catheterization and advised for medical management. She was then re-admitted 8/13 for 8-9/10 chest pain, retrosternal, non-radiating, not associated with N/V. Plan was to maximize medical therapy and aggressive risk factor modification. Pharmacy consulted to initiate patient on a heparin  infusion for ACS/STEMI.  Baseline labs: INR- ordered aPTT- ordered Hgb- 12.8 PLT- 285  No signs/symptoms of bleeding per notes.  Goal of Therapy:  Heparin  level 0.3-0.7 units/ml Monitor platelets by anticoagulation protocol: Yes   8/20@1455 : HL 0.74, supratherapeutic@1050  units/hr  Plan:  Decrease heparin  infusion to 950 units/hr Check anti-Xa level in 6 hours after rate change and daily while on heparin  Continue to monitor H&H and platelets  Malea Swilling A Meleny Tregoning, PharmD Clinical Pharmacist 02/22/2024 3:18 PM

## 2024-02-22 NOTE — ED Notes (Signed)
 CCMD called to admit patient to monitor

## 2024-02-22 NOTE — H&P (Signed)
 History and Physical    Felicia Woods FMW:[id number] DOB: May 07, 1956 DOA: 02/22/2024  PCP: Kristina Tinnie POUR, PA-C (Confirm with patient/family/NH records and if not entered, this has to be entered at [facility] point of entry) Patient coming from: Home  I have personally briefly reviewed patient's old medical records in [facility]  Chief Complaint: Chest pain  HPI: Felicia Woods is a 68 y.o. female with medical history significant of three-vessel CAD status post CABG, with chronic angina, not amendable to stenting, with recent NSTEMI on medical management, HTN, HLD, presented with persistent chest pain.  Patient was recently diagnosed with NSTEMI and LHC showed three-vessel CAD, not amenable for stenting and recommended by cardiology for home medical therapy.  Patient already on Imdur  and Ranexa .  Despite that, patient continued to experience chest pains, mostly happen at night around 2-3 AM, and last night, patient woke up with same chest pain but more severe associated with shortness of breath and decided to come to the hospital.  No cough, no fever chills, and patient reported that she has been compliant with all her medications.  ED Course: Afebrile, nontachycardic blood pressure 178/94 O2 saturation 98% room air.  Troponin 115>181.  EKG negative for acute ST changes chest x-ray negative for acute findings.  Patient was started on nitroglycerin  drip and heparin  drip.  Review of Systems: As per HPI otherwise 14 point review of systems negative.   Past Medical History:  Diagnosis Date   Asthma    Coronary artery disease    Diabetes mellitus without complication (HCC)    Heart attack (HCC)    Hyperlipidemia    Hypertension    MI (myocardial infarction) (HCC)    Migraine headache with aura    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    Past Surgical History:  Procedure Laterality Date   ABDOMINAL HYSTERECTOMY     CARDIAC CATHETERIZATION     CARDIAC CATHETERIZATION N/A 10/18/2015    Procedure: Left Heart Cath and Cors/Grafts Angiography;  Surgeon: Dorn JINNY Lesches, MD;  Location: [facility] INVASIVE CV LAB;  Service: Cardiovascular;  Laterality: N/A;   CARDIAC CATHETERIZATION N/A 10/18/2015   Procedure: Coronary Stent Intervention;  Surgeon: Dorn JINNY Lesches, MD;  Location: [facility] INVASIVE CV LAB;  Service: Cardiovascular;  Laterality: N/A;   CARDIAC SURGERY     CHOLECYSTECTOMY     COLONOSCOPY WITH PROPOFOL  N/A 12/02/2021   Procedure: COLONOSCOPY WITH PROPOFOL ;  Surgeon: Therisa Bi, MD;  Location[facility] ENDOSCOPY;  Service: Gastroenterology;  Laterality: N/A;   CORONARY ANGIOPLASTY     CORONARY ARTERY BYPASS GRAFT     4 vessels - 2010   CORONARY STENT INTERVENTION N/A 02/16/2017   Procedure: CORONARY STENT INTERVENTION;  Surgeon: Florencio Cara BIRCH, MD;  Location: [facility] INVASIVE CV LAB;  Service: Cardiovascular;  Laterality: N/A;   CORONARY STENT INTERVENTION N/A 02/13/2019   Procedure: CORONARY STENT INTERVENTION;  Surgeon: Mady Bruckner, MD;  Location: [facility] INVASIVE CV LAB;  Service: Cardiovascular;  Laterality: N/A;  SVG to RCA   CORONARY STENT INTERVENTION Left 03/08/2019   Procedure: CORONARY STENT INTERVENTION;  Surgeon: Florencio Cara BIRCH, MD;  Location: [facility] INVASIVE CV LAB;  Service: Cardiovascular;  Laterality: Left;   CORONARY STENT INTERVENTION N/A 11/04/2020   Procedure: CORONARY STENT INTERVENTION;  Surgeon: Mady Bruckner, MD;  Location: [facility] INVASIVE CV LAB;  Service: Cardiovascular;  Laterality: N/A;   CORONARY STENT INTERVENTION N/A 12/24/2021   Procedure: CORONARY STENT INTERVENTION;  Surgeon: Lawyer Bernardino Cough, MD;  Location: [facility] INVASIVE CV  LAB;  Service: Cardiovascular;  Laterality: N/A;   ESOPHAGOGASTRODUODENOSCOPY N/A 12/02/2021   Procedure: ESOPHAGOGASTRODUODENOSCOPY (EGD);  Surgeon: Therisa Bi, MD;  Location: [facility] ENDOSCOPY;  Service: Gastroenterology;  Laterality: N/A;   LEFT HEART CATH AND CORONARY ANGIOGRAPHY Left 02/16/2017   Procedure: LEFT HEART CATH AND  CORONARY ANGIOGRAPHY;  Surgeon: Hester Wolm PARAS, MD;  Location: [facility] INVASIVE CV LAB;  Service: Cardiovascular;  Laterality: Left;   LEFT HEART CATH AND CORONARY ANGIOGRAPHY Left 12/24/2021   Procedure: LEFT HEART CATH AND CORONARY ANGIOGRAPHY;  Surgeon: Hester Wolm PARAS, MD;  Location: [facility] INVASIVE CV LAB;  Service: Cardiovascular;  Laterality: Left;   LEFT HEART CATH AND CORONARY ANGIOGRAPHY Left 06/17/2022   Procedure: LEFT HEART CATH AND CORONARY ANGIOGRAPHY;  Surgeon: Florencio Cara BIRCH, MD;  Location: [facility] INVASIVE CV LAB;  Service: Cardiovascular;  Laterality: Left;   LEFT HEART CATH AND CORS/GRAFTS ANGIOGRAPHY Left 11/23/2017   Procedure: LEFT HEART CATH AND CORS/GRAFTS ANGIOGRAPHY;  Surgeon: Hester Wolm PARAS, MD;  Location: [facility] INVASIVE CV LAB;  Service: Cardiovascular;  Laterality: Left;   LEFT HEART CATH AND CORS/GRAFTS ANGIOGRAPHY N/A 02/13/2019   Procedure: LEFT HEART CATH AND CORS/GRAFTS ANGIOGRAPHY;  Surgeon: Hester Wolm PARAS, MD;  Location: [facility] INVASIVE CV LAB;  Service: Cardiovascular;  Laterality: N/A;   LEFT HEART CATH AND CORS/GRAFTS ANGIOGRAPHY N/A 07/03/2019   Procedure: LEFT HEART CATH AND CORS/GRAFTS ANGIOGRAPHY;  Surgeon: Hester Wolm PARAS, MD;  Location: [facility] INVASIVE CV LAB;  Service: Cardiovascular;  Laterality: N/A;   LEFT HEART CATH AND CORS/GRAFTS ANGIOGRAPHY N/A 11/04/2020   Procedure: LEFT HEART CATH AND CORS/GRAFTS ANGIOGRAPHY;  Surgeon: Hester Wolm PARAS, MD;  Location: [facility] INVASIVE CV LAB;  Service: Cardiovascular;  Laterality: N/A;   LEFT HEART CATH AND CORS/GRAFTS ANGIOGRAPHY N/A 02/14/2024   Procedure: LEFT HEART CATH AND CORS/GRAFTS ANGIOGRAPHY;  Surgeon: Ammon Blunt, MD;  Location: [facility] INVASIVE CV LAB;  Service: Cardiovascular;  Laterality: N/A;     reports that she has never smoked. She has never used smokeless tobacco. She reports that she does not drink alcohol and does not use drugs.  Allergies  Allergen Reactions   Penicillin G  Anaphylaxis and Other (See Comments)    Has patient had a PCN reaction causing immediate rash, facial/tongue/throat swelling, SOB or lightheadedness with hypotension: bzd:[id number]} Has patient had a PCN reaction causing severe rash involving mucus membranes or skin necrosis: no:[id number]} Has patient had a PCN reaction that required hospitalization no:[id number]} Has patient had a PCN reaction occurring within the last 10 years: no:[id number]} If all of the above answers are NO, then may proceed with Cephalosporin use.   [facility] (Diagnostic)     Family History  Problem Relation Age of Onset   Diabetes Mother    Diabetes Father    Cancer Father    Diabetes Brother      Prior to Admission medications   Medication Sig Start Date End Date Taking? Authorizing Provider  acetaminophen  (TYLENOL ) 325 MG tablet Take 2 tablets (650 mg total) by mouth every 6 (six) hours as needed for mild pain (pain score 1-3) (or Fever >/= 101). 02/17/24   Jens Durand, MD  aspirin  EC 81 MG tablet Take 81 mg by mouth daily.    [provider]  atorvastatin  (LIPITOR ) 40 MG tablet Take 2 tablets (80 mg total) by mouth daily. 02/14/24   Patel, Sona, MD  Blood Glucose Monitoring Suppl [facility] VERIO FLEX SYSTEM) w/Device KIT Use as directed twice a daily DX E11.65 07/22/22   McDonough, Tinnie POUR,  PA-C  Budeson-Glycopyrrol-Formoterol  (BREZTRI  AEROSPHERE) 160-9-4.8 MCG/ACT AERO Inhale 2 puffs into the lungs 2 (two) times daily. 04/14/23   McDonough, Tinnie POUR, PA-C  clopidogrel  (PLAVIX ) 75 MG tablet Take 1 tablet (75 mg total) by mouth daily. 02/14/24   Patel, Sona, MD  empagliflozin  (JARDIANCE ) 25 MG TABS tablet Take one tab a day for diabetes 11/24/23   McDonough, Lauren K, PA-C  escitalopram  (LEXAPRO ) 10 MG tablet Take 1 tablet (10 mg total) by mouth daily. 11/24/23   McDonough, Tinnie POUR, PA-C  Evolocumab  (REPATHA  SURECLICK) 140 MG/ML SOAJ Inject 140 mg into the skin every 14 (fourteen) days. 02/17/24   Jens Durand, MD  ezetimibe  (ZETIA ) 10 MG tablet Take 1 tablet (10 mg total) by mouth daily. 02/18/24   Jens Durand, MD  famotidine  (PEPCID ) 20 MG tablet Take 1 tablet (20 mg total) by mouth 2 (two) times daily. 11/24/23   McDonough, Tinnie POUR, PA-C  furosemide  (LASIX ) 40 MG tablet Take 1 tablet (40 mg total) by mouth daily. 02/14/24   Patel, Sona, MD  glucose blood [facility] VERIO) test strip Use as instructed twice daily DX E11.65 11/24/23   McDonough, Lauren K, PA-C  hydrALAZINE  (APRESOLINE ) 10 MG tablet Take 1 tablet (10 mg total) by mouth 3 (three) times daily. 11/24/23   McDonough, Tinnie POUR, PA-C  isosorbide  mononitrate (IMDUR ) 120 MG 24 hr tablet Take 1 tablet (120 mg total) by mouth 2 (two) times daily. 02/14/24   Patel, Sona, MD  losartan  (COZAAR ) 100 MG tablet Take 0.5 tablets (50 mg total) by mouth daily. 02/14/24   Patel, Sona, MD  metoprolol  succinate (TOPROL -XL) 100 MG 24 hr tablet Take 1 tablet (100 mg total) by mouth daily. 02/14/24   Tobie Calix, MD  Misc. Devices (ADJUST BATH/SHOWER SEAT) MISC 1 shower seat for use at home to reduce risk of falling. 01/13/22   Liana Fish, NP  montelukast  (SINGULAIR ) 10 MG tablet Take 1 tablet (10 mg total) by mouth at bedtime. 11/24/23   McDonough, Lauren K, PA-C  nitroGLYCERIN  (NITROSTAT ) 0.4 MG SL tablet DISSOLVE ONE TABLET UNDER THE TONGUE EVERY 5 MINUTES AS NEEDED FOR CHEST PAIN.  DO NOT EXCEED A TOTAL OF 3 DOSES IN 15 MINUTES 04/12/21   Khan, Fozia M, MD  OneTouch Delica Lancets 30G MISC USE 1  TO CHECK GLUCOSE TWICE DAILY AS DIRECTED 05/11/23   McDonough, Lauren K, PA-C  oxybutynin  (DITROPAN ) 5 MG tablet Take 1 tablet (5 mg total) by mouth daily. 11/24/23   McDonough, Lauren K, PA-C  oxyCODONE  (OXY IR/ROXICODONE ) 5 MG immediate release tablet Take 1 tablet (5 mg total) by mouth every 8 (eight) hours as needed for moderate pain (pain score 4-6). 02/17/24   Jens Durand, MD  PROAIR  RESPICLICK 108 (90 Base) MCG/ACT AEPB Inhale 2 puffs into the lungs every  6 (six) hours as needed. 07/22/22   McDonough, Tinnie POUR, PA-C  ranolazine  (RANEXA ) 1000 MG SR tablet Take 1 tablet (1,000 mg total) by mouth 2 (two) times daily. 02/14/24   Patel, Sona, MD  tirzepatide  (MOUNJARO ) 2.5 MG/0.5ML Pen Inject 2.5 mg into the skin once a week. 11/24/23   McDonough, Tinnie POUR, PA-C  zolpidem  (AMBIEN ) 5 MG tablet Take 1 tablet (5 mg total) by mouth at bedtime as needed. for sleep 11/24/23   Kristina Tinnie POUR, PA-C    Physical Exam: Vitals:   02/22/24 0537 02/22/24 0555 02/22/24 0800 02/22/24 1011  BP: (!) 170/94  (!) 142/80   Pulse: 76  61   Resp:  18  19   Temp: 98.1 F (36.7 C)   98.2 F (36.8 C)  TempSrc: Oral   Oral  SpO2: 98%  95%   Weight:  107.5 kg    Height:  5' 7 (1.702 m)      Constitutional: NAD, calm, comfortable Vitals:   02/22/24 0537 02/22/24 0555 02/22/24 0800 02/22/24 1011  BP: (!) 170/94  (!) 142/80   Pulse: 76  61   Resp: 18  19   Temp: 98.1 F (36.7 C)   98.2 F (36.8 C)  TempSrc: Oral   Oral  SpO2: 98%  95%   Weight:  107.5 kg    Height:  5' 7 (1.702 m)     Eyes: PERRL, lids and conjunctivae normal ENMT: Mucous membranes are moist. Posterior pharynx clear of any exudate or lesions.Normal dentition.  Neck: normal, supple, no masses, no thyromegaly Respiratory: clear to auscultation bilaterally, no wheezing, no crackles. Normal respiratory effort. No accessory muscle use.  Cardiovascular: Regular rate and rhythm, no murmurs / rubs / gallops. No extremity edema. 2+ pedal pulses. No carotid bruits.  Abdomen: no tenderness, no masses palpated. No hepatosplenomegaly. Bowel sounds positive.  Musculoskeletal: no clubbing / cyanosis. No joint deformity upper and lower extremities. Good ROM, no contractures. Normal muscle tone.  Skin: no rashes, lesions, ulcers. No induration Neurologic: CN 2-12 grossly intact. Sensation intact, DTR normal. Strength 5/5 in all 4.  Psychiatric: Normal judgment and insight. Alert and oriented x 3. Normal  mood.    Labs on Admission: I have personally reviewed following labs and imaging studies  CBC: Recent Labs  Lab 02/16/24 0636 02/17/24 0344 02/22/24 0548  WBC 9.0 8.2 9.5  NEUTROABS  --   --  5.6  HGB 11.9* 12.4 12.8  HCT 36.9 38.6 39.6  MCV 81.3 81.6 81.5  PLT 242 238 285   Basic Metabolic Panel: Recent Labs  Lab 02/16/24 0636 02/22/24 0548 02/22/24 0656  NA 135  --  136  K 3.9  --  3.8  CL 101  --  103  CO2 25  --  25  GLUCOSE 114*  --  191*  BUN 21  --  16  CREATININE 0.85  --  0.63  CALCIUM  9.0  --  9.1  MG  --  1.9  --    GFR: Estimated Creatinine Clearance: 87.4 mL/min (by C-G formula based on SCr of 0.63 mg/dL). Liver Function Tests: Recent Labs  Lab 02/22/24 0656  AST 21  ALT 19  ALKPHOS 51  BILITOT 0.2  PROT 7.5  ALBUMIN 3.4*   No results for input(s): LIPASE, AMYLASE in the last 168 hours. No results for input(s): AMMONIA in the last 168 hours. Coagulation Profile: Recent Labs  Lab 02/22/24 0806  INR 1.2   Cardiac Enzymes: No results for input(s): CKTOTAL, CKMB, CKMBINDEX, TROPONINI in the last 168 hours. BNP (last 3 results) No results for input(s): PROBNP in the last 8760 hours. HbA1C: No results for input(s): HGBA1C in the last 72 hours. CBG: Recent Labs  Lab 02/16/24 0747 02/16/24 1133 02/16/24 1720 02/16/24 2033 02/17/24 0750  GLUCAP 110* 183* 118* 199* 121*   Lipid Profile: No results for input(s): CHOL, HDL, LDLCALC, TRIG, CHOLHDL, LDLDIRECT in the last 72 hours. Thyroid  Function Tests: No results for input(s): TSH, T4TOTAL, FREET4, T3FREE, THYROIDAB in the last 72 hours. Anemia Panel: No results for input(s): VITAMINB12, FOLATE, FERRITIN, TIBC, IRON, RETICCTPCT in the last 72 hours. Urine analysis:    Component Value  Date/Time   COLORURINE YELLOW (A) 03/06/2021 0736   APPEARANCEUR Clear 12/17/2022 1240   LABSPEC 1.031 (H) 03/06/2021 0736   PHURINE 5.0 03/06/2021  0736   GLUCOSEU Negative 12/17/2022 1240   HGBUR NEGATIVE 03/06/2021 0736   BILIRUBINUR Negative 12/17/2022 1240   KETONESUR NEGATIVE 03/06/2021 0736   PROTEINUR Negative 12/17/2022 1240   PROTEINUR NEGATIVE 03/06/2021 0736   NITRITE Negative 12/17/2022 1240   NITRITE NEGATIVE 03/06/2021 0736   LEUKOCYTESUR Negative 12/17/2022 1240   LEUKOCYTESUR NEGATIVE 03/06/2021 0736    Radiological Exams on Admission: CT Angio Chest PE W and/or Wo Contrast Result Date: 02/22/2024 CLINICAL DATA:  Concern for acute pulmonary embolism.  Chest pain EXAM: CT ANGIOGRAPHY CHEST WITH CONTRAST TECHNIQUE: Multidetector CT imaging of the chest was performed using the standard protocol during bolus administration of intravenous contrast. Multiplanar CT image reconstructions and MIPs were obtained to evaluate the vascular anatomy. RADIATION DOSE REDUCTION: This exam was performed according to the departmental dose-optimization program which includes automated exposure control, adjustment of the mA and/or kV according to patient size and/or use of iterative reconstruction technique. CONTRAST:  75mL OMNIPAQUE  IOHEXOL  350 MG/ML SOLN COMPARISON:  None Available. FINDINGS: Cardiovascular: No filling defects within the pulmonary arteries to suggest acute pulmonary embolism. Mediastinum/Nodes: No axillary or supraclavicular adenopathy. No mediastinal or hilar adenopathy. No pericardial fluid. Esophagus normal. Lungs/Pleura: No suspicious pulmonary nodules. Normal pleural. Airways normal. No pulmonary infarction. No pneumonia. No pleural fluid. No pneumothorax Upper Abdomen: Limited view of the liver, kidneys, pancreas are unremarkable. Normal adrenal glands. Musculoskeletal: No aggressive osseous lesion. Review of the MIP images confirms the above findings. IMPRESSION: 1. No evidence acute pulmonary embolism. 2. No acute pulmonary parenchymal findings. Electronically Signed   By: Jackquline Boxer M.D.   On: 02/22/2024 08:42   DG  Chest Portable 1 View Result Date: 02/22/2024 CLINICAL DATA:  Chest pain and shortness of breath. EXAM: PORTABLE CHEST 1 VIEW COMPARISON:  02/15/2024 FINDINGS: Similar linear densities in both lung bases compatible with chronic atelectasis or scarring. No edema or new focal airspace consolidation. No substantial pleural effusion. The cardio pericardial silhouette is enlarged. Telemetry leads overlie the chest. IMPRESSION: Chronic atelectasis or scarring in the lung bases. No acute cardiopulmonary findings. Electronically Signed   By: Camellia Candle M.D.   On: 02/22/2024 06:23    EKG: Independently reviewed.  Sinus rhythm, chronic ST-T changes on multiple leads  Assessment/Plan Active Problems:   * No active hospital problems. *  (please populate well all problems here in Problem List. (For example, if patient is on BP meds at home and you resume or decide to hold them, it is a problem that needs to be her. Same for CAD, COPD, HLD and so on)  Angina at rest Three-vessel CAD status post recent NSTEMI and LHC - Discussed with on-call cardiology, recommended continue medical therapy, cardiology suggested optimization of her BP/CAD medications.  If tolerable, consider increase metoprolol  dosage, and/or add CCB.  Currently patient is on nitroglycerin  drip, will hold off initiating CCB.  Hold off Ranexa  and Imdur  while on nitroglycerin  drip. -Plan to continue nitroglycerin  drip at least for today and tomorrow, if pain is controlled likely can go home as early as tomorrow evening. - Morphine  for breakthrough pains - Continue aspirin  Plavix  and statin - Given that her chest pain symptoms always happen at night, raise a concern about nocturnal hypoxia, recommend outpatient sleep study to rule out OSA.  IIDM - Hold off p.o. diabetic medications - SSI  HTN -  Continue Lasix , continue beta-blocker - Hold off Imdur  and Ranexa  and hydralazine  while patient on nitroglycerin  drip  Total time spent on patient  care 55 minutes DVT prophylaxis: Heparin  drip Code Status: Full code Family Communication: None at bedside Disposition Plan: Expect more than 2 midnight hospital stay as patient is sick with angina at rest requiring ACS medication including 48-72 hours of heparin  drip, expect more than 2 midnight hospital stay Consults called: Curbside consult with cardiology Admission status: PCU admit   Cort ONEIDA Mana MD [facility] Pager [phone]  02/22/2024, 10:12 AM

## 2024-02-22 NOTE — ED Provider Notes (Signed)
 [facility] Provider Note    Event Date/Time   First MD Initiated Contact with Patient 02/22/24 [phone]     (approximate)   History   Chest Pain   HPI  Felicia Woods is a 68 y.o. female with history of coronary artery disease, hypertension, hyperlipidemia who presents to the emergency department with chest tightness, shortness of breath, nausea without vomiting, diaphoresis, dizziness.  No fevers or cough.  No lower extremity swelling or pain.  Feels like her previous anginal equivalent.  Was just admitted to the hospital for an NSTEMI and cardiac catheterization showed severe three-vessel disease that she states was not amendable to treatment.  They recommended aggressive medical therapy.  She reports compliance with her antiplatelet agents.  She took aspirin , nitroglycerin  prior to arrival without relief.   History provided by patient, EMS.    Past Medical History:  Diagnosis Date   Asthma    Coronary artery disease    Diabetes mellitus without complication (HCC)    Heart attack (HCC)    Hyperlipidemia    Hypertension    MI (myocardial infarction) (HCC)    Migraine headache with aura    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    Past Surgical History:  Procedure Laterality Date   ABDOMINAL HYSTERECTOMY     CARDIAC CATHETERIZATION     CARDIAC CATHETERIZATION N/A 10/18/2015   Procedure: Left Heart Cath and Cors/Grafts Angiography;  Surgeon: Dorn JINNY Lesches, MD;  Location: [facility] INVASIVE CV LAB;  Service: Cardiovascular;  Laterality: N/A;   CARDIAC CATHETERIZATION N/A 10/18/2015   Procedure: Coronary Stent Intervention;  Surgeon: Dorn JINNY Lesches, MD;  Location: [facility] INVASIVE CV LAB;  Service: Cardiovascular;  Laterality: N/A;   CARDIAC SURGERY     CHOLECYSTECTOMY     COLONOSCOPY WITH PROPOFOL  N/A 12/02/2021   Procedure: COLONOSCOPY WITH PROPOFOL ;  Surgeon: Therisa Bi, MD;  Location: [facility] ENDOSCOPY;  Service: Gastroenterology;  Laterality: N/A;   CORONARY  ANGIOPLASTY     CORONARY ARTERY BYPASS GRAFT     4 vessels - 2010   CORONARY STENT INTERVENTION N/A 02/16/2017   Procedure: CORONARY STENT INTERVENTION;  Surgeon: Florencio Cara BIRCH, MD;  Location: [facility] INVASIVE CV LAB;  Service: Cardiovascular;  Laterality: N/A;   CORONARY STENT INTERVENTION N/A 02/13/2019   Procedure: CORONARY STENT INTERVENTION;  Surgeon: Mady Bruckner, MD;  Location: [facility] INVASIVE CV LAB;  Service: Cardiovascular;  Laterality: N/A;  SVG to RCA   CORONARY STENT INTERVENTION Left 03/08/2019   Procedure: CORONARY STENT INTERVENTION;  Surgeon: Florencio Cara BIRCH, MD;  Location: [facility] INVASIVE CV LAB;  Service: Cardiovascular;  Laterality: Left;   CORONARY STENT INTERVENTION N/A 11/04/2020   Procedure: CORONARY STENT INTERVENTION;  Surgeon: Mady Bruckner, MD;  Location: [facility] INVASIVE CV LAB;  Service: Cardiovascular;  Laterality: N/A;   CORONARY STENT INTERVENTION N/A 12/24/2021   Procedure: CORONARY STENT INTERVENTION;  Surgeon: Lawyer Bernardino Cough, MD;  Location: [facility] INVASIVE CV LAB;  Service: Cardiovascular;  Laterality: N/A;   ESOPHAGOGASTRODUODENOSCOPY N/A 12/02/2021   Procedure: ESOPHAGOGASTRODUODENOSCOPY (EGD);  Surgeon: Therisa Bi, MD;  Location: [facility] ENDOSCOPY;  Service: Gastroenterology;  Laterality: N/A;   LEFT HEART CATH AND CORONARY ANGIOGRAPHY Left 02/16/2017   Procedure: LEFT HEART CATH AND CORONARY ANGIOGRAPHY;  Surgeon: Hester Wolm JINNY, MD;  Location: [facility] INVASIVE CV LAB;  Service: Cardiovascular;  Laterality: Left;   LEFT HEART CATH AND CORONARY ANGIOGRAPHY Left 12/24/2021   Procedure: LEFT HEART CATH AND CORONARY ANGIOGRAPHY;  Surgeon: Hester Wolm JINNY, MD;  Location:  [facility] INVASIVE CV LAB;  Service: Cardiovascular;  Laterality: Left;   LEFT HEART CATH AND CORONARY ANGIOGRAPHY Left 06/17/2022   Procedure: LEFT HEART CATH AND CORONARY ANGIOGRAPHY;  Surgeon: Florencio Cara BIRCH, MD;  Location: [facility] INVASIVE CV LAB;  Service: Cardiovascular;  Laterality: Left;    LEFT HEART CATH AND CORS/GRAFTS ANGIOGRAPHY Left 11/23/2017   Procedure: LEFT HEART CATH AND CORS/GRAFTS ANGIOGRAPHY;  Surgeon: Hester Wolm PARAS, MD;  Location: [facility] INVASIVE CV LAB;  Service: Cardiovascular;  Laterality: Left;   LEFT HEART CATH AND CORS/GRAFTS ANGIOGRAPHY N/A 02/13/2019   Procedure: LEFT HEART CATH AND CORS/GRAFTS ANGIOGRAPHY;  Surgeon: Hester Wolm PARAS, MD;  Location: [facility] INVASIVE CV LAB;  Service: Cardiovascular;  Laterality: N/A;   LEFT HEART CATH AND CORS/GRAFTS ANGIOGRAPHY N/A 07/03/2019   Procedure: LEFT HEART CATH AND CORS/GRAFTS ANGIOGRAPHY;  Surgeon: Hester Wolm PARAS, MD;  Location: [facility] INVASIVE CV LAB;  Service: Cardiovascular;  Laterality: N/A;   LEFT HEART CATH AND CORS/GRAFTS ANGIOGRAPHY N/A 11/04/2020   Procedure: LEFT HEART CATH AND CORS/GRAFTS ANGIOGRAPHY;  Surgeon: Hester Wolm PARAS, MD;  Location: [facility] INVASIVE CV LAB;  Service: Cardiovascular;  Laterality: N/A;   LEFT HEART CATH AND CORS/GRAFTS ANGIOGRAPHY N/A 02/14/2024   Procedure: LEFT HEART CATH AND CORS/GRAFTS ANGIOGRAPHY;  Surgeon: Ammon Blunt, MD;  Location: [facility] INVASIVE CV LAB;  Service: Cardiovascular;  Laterality: N/A;    MEDICATIONS:  Prior to Admission medications   Medication Sig Start Date End Date Taking? Authorizing Provider  acetaminophen  (TYLENOL ) 325 MG tablet Take 2 tablets (650 mg total) by mouth every 6 (six) hours as needed for mild pain (pain score 1-3) (or Fever >/= 101). 02/17/24   Jens Durand, MD  aspirin  EC 81 MG tablet Take 81 mg by mouth daily.    [provider]  atorvastatin  (LIPITOR ) 40 MG tablet Take 2 tablets (80 mg total) by mouth daily. 02/14/24   Patel, Sona, MD  Blood Glucose Monitoring Suppl [facility] VERIO FLEX SYSTEM) w/Device KIT Use as directed twice a daily DX E11.65 07/22/22   McDonough, Tinnie POUR, PA-C  Budeson-Glycopyrrol-Formoterol  (BREZTRI  AEROSPHERE) 160-9-4.8 MCG/ACT AERO Inhale 2 puffs into the lungs 2 (two) times daily. 04/14/23    McDonough, Lauren K, PA-C  clopidogrel  (PLAVIX ) 75 MG tablet Take 1 tablet (75 mg total) by mouth daily. 02/14/24   Tobie Calix, MD  empagliflozin  (JARDIANCE ) 25 MG TABS tablet Take one tab a day for diabetes 11/24/23   McDonough, Tinnie POUR, PA-C  escitalopram  (LEXAPRO ) 10 MG tablet Take 1 tablet (10 mg total) by mouth daily. 11/24/23   McDonough, Tinnie POUR, PA-C  Evolocumab  (REPATHA  SURECLICK) 140 MG/ML SOAJ Inject 140 mg into the skin every 14 (fourteen) days. 02/17/24   Jens Durand, MD  ezetimibe  (ZETIA ) 10 MG tablet Take 1 tablet (10 mg total) by mouth daily. 02/18/24   Jens Durand, MD  famotidine  (PEPCID ) 20 MG tablet Take 1 tablet (20 mg total) by mouth 2 (two) times daily. 11/24/23   McDonough, Tinnie POUR, PA-C  furosemide  (LASIX ) 40 MG tablet Take 1 tablet (40 mg total) by mouth daily. 02/14/24   Patel, Sona, MD  glucose blood [facility] VERIO) test strip Use as instructed twice daily DX E11.65 11/24/23   McDonough, Lauren K, PA-C  hydrALAZINE  (APRESOLINE ) 10 MG tablet Take 1 tablet (10 mg total) by mouth 3 (three) times daily. 11/24/23   McDonough, Tinnie POUR, PA-C  isosorbide  mononitrate (IMDUR ) 120 MG 24 hr tablet Take 1 tablet (120 mg total) by mouth 2 (two) times daily. 02/14/24  Tobie Calix, MD  losartan  (COZAAR ) 100 MG tablet Take 0.5 tablets (50 mg total) by mouth daily. 02/14/24   Patel, Sona, MD  metoprolol  succinate (TOPROL -XL) 100 MG 24 hr tablet Take 1 tablet (100 mg total) by mouth daily. 02/14/24   Tobie Calix, MD  Misc. Devices (ADJUST BATH/SHOWER SEAT) MISC 1 shower seat for use at home to reduce risk of falling. 01/13/22   Liana Fish, NP  montelukast  (SINGULAIR ) 10 MG tablet Take 1 tablet (10 mg total) by mouth at bedtime. 11/24/23   McDonough, Lauren K, PA-C  nitroGLYCERIN  (NITROSTAT ) 0.4 MG SL tablet DISSOLVE ONE TABLET UNDER THE TONGUE EVERY 5 MINUTES AS NEEDED FOR CHEST PAIN.  DO NOT EXCEED A TOTAL OF 3 DOSES IN 15 MINUTES 04/12/21   Khan, Fozia M, MD  OneTouch Delica Lancets  30G MISC USE 1  TO CHECK GLUCOSE TWICE DAILY AS DIRECTED 05/11/23   McDonough, Lauren K, PA-C  oxybutynin  (DITROPAN ) 5 MG tablet Take 1 tablet (5 mg total) by mouth daily. 11/24/23   McDonough, Lauren K, PA-C  oxyCODONE  (OXY IR/ROXICODONE ) 5 MG immediate release tablet Take 1 tablet (5 mg total) by mouth every 8 (eight) hours as needed for moderate pain (pain score 4-6). 02/17/24   Jens Durand, MD  PROAIR  RESPICLICK 108 (90 Base) MCG/ACT AEPB Inhale 2 puffs into the lungs every 6 (six) hours as needed. 07/22/22   McDonough, Tinnie POUR, PA-C  ranolazine  (RANEXA ) 1000 MG SR tablet Take 1 tablet (1,000 mg total) by mouth 2 (two) times daily. 02/14/24   Patel, Sona, MD  tirzepatide  (MOUNJARO ) 2.5 MG/0.5ML Pen Inject 2.5 mg into the skin once a week. 11/24/23   McDonough, Tinnie POUR, PA-C  zolpidem  (AMBIEN ) 5 MG tablet Take 1 tablet (5 mg total) by mouth at bedtime as needed. for sleep 11/24/23   Kristina Tinnie POUR, PA-C    Physical Exam   Triage Vital Signs: ED Triage Vitals [02/22/24 0537]  Encounter Vitals Group     BP (!) 170/94     Girls Systolic BP Percentile      Girls Diastolic BP Percentile      Boys Systolic BP Percentile      Boys Diastolic BP Percentile      Pulse Rate 76     Resp 18     Temp 98.1 F (36.7 C)     Temp Source Oral     SpO2 98 %     Weight      Height      Head Circumference      Peak Flow      Pain Score 10     Pain Loc      Pain Education      Exclude from Growth Chart     Most recent vital signs: Vitals:   02/22/24 0537 02/22/24 0800  BP: (!) 170/94 (!) 142/80  Pulse: 76 61  Resp: 18 19  Temp: 98.1 F (36.7 C)   SpO2: 98% 95%    CONSTITUTIONAL: Alert, responds appropriately to questions.  Appears uncomfortable HEAD: Normocephalic, atraumatic EYES: Conjunctivae clear, pupils appear equal, sclera nonicteric ENT: normal nose; moist mucous membranes NECK: Supple, normal ROM CARD: RRR; S1 and S2 appreciated RESP: Normal chest excursion without  splinting or tachypnea; breath sounds clear and equal bilaterally; no wheezes, no rhonchi, no rales, no hypoxia or respiratory distress, speaking full sentences ABD/GI: Non-distended; soft, non-tender, no rebound, no guarding, no peritoneal signs BACK: The back appears normal EXT: Normal ROM in all  joints; no deformity noted, no edema, no calf tenderness or calf swelling SKIN: Normal color for age and race; warm; no rash on exposed skin NEURO: Moves all extremities equally, normal speech PSYCH: The patient's mood and manner are appropriate.   ED Results / Procedures / Treatments   LABS: (all labs ordered are listed, but only abnormal results are displayed) Labs Reviewed  CBC WITH DIFFERENTIAL/PLATELET - Abnormal; Notable for the following components:      Result Value   RDW 16.4 (*)    All other components within normal limits  D-DIMER, QUANTITATIVE - Abnormal; Notable for the following components:   D-Dimer, Quant 1.54 (*)    All other components within normal limits  COMPREHENSIVE METABOLIC PANEL WITH GFR - Abnormal; Notable for the following components:   Glucose, Bld 191 (*)    Albumin 3.4 (*)    All other components within normal limits  PROTIME-INR - Abnormal; Notable for the following components:   Prothrombin Time 15.6 (*)    All other components within normal limits  TROPONIN I (HIGH SENSITIVITY) - Abnormal; Notable for the following components:   Troponin I (High Sensitivity) 115 (*)    All other components within normal limits  TROPONIN I (HIGH SENSITIVITY) - Abnormal; Notable for the following components:   Troponin I (High Sensitivity) 181 (*)    All other components within normal limits  MAGNESIUM   APTT  HEPARIN  LEVEL (UNFRACTIONATED)     EKG:  EKG Interpretation Date/Time:  Wednesday February 22 2024 05:35:35 EDT Ventricular Rate:  81 PR Interval:  160 QRS Duration:  142 QT Interval:  436 QTC Calculation: 506 R Axis:   -16  Text Interpretation: Sinus  rhythm with Premature atrial complexes with Abberant conduction Right bundle branch block Minimal voltage criteria for LVH, may be normal variant ( R in aVL ) Inferior infarct , age undetermined T wave abnormality, consider lateral ischemia Abnormal ECG When compared with ECG of 15-Feb-2024 05:29, PREVIOUS ECG IS PRESENT No significant change since last tracing Confirmed by Neomi Neptune [phone]) on 02/22/2024 5:38:24 AM         RADIOLOGY: My personal review and interpretation of imaging: Chest x-ray shows no acute abnormality.  I have personally reviewed all radiology reports.   CT Angio Chest PE W and/or Wo Contrast Result Date: 02/22/2024 CLINICAL DATA:  Concern for acute pulmonary embolism.  Chest pain EXAM: CT ANGIOGRAPHY CHEST WITH CONTRAST TECHNIQUE: Multidetector CT imaging of the chest was performed using the standard protocol during bolus administration of intravenous contrast. Multiplanar CT image reconstructions and MIPs were obtained to evaluate the vascular anatomy. RADIATION DOSE REDUCTION: This exam was performed according to the departmental dose-optimization program which includes automated exposure control, adjustment of the mA and/or kV according to patient size and/or use of iterative reconstruction technique. CONTRAST:  75mL OMNIPAQUE  IOHEXOL  350 MG/ML SOLN COMPARISON:  None Available. FINDINGS: Cardiovascular: No filling defects within the pulmonary arteries to suggest acute pulmonary embolism. Mediastinum/Nodes: No axillary or supraclavicular adenopathy. No mediastinal or hilar adenopathy. No pericardial fluid. Esophagus normal. Lungs/Pleura: No suspicious pulmonary nodules. Normal pleural. Airways normal. No pulmonary infarction. No pneumonia. No pleural fluid. No pneumothorax Upper Abdomen: Limited view of the liver, kidneys, pancreas are unremarkable. Normal adrenal glands. Musculoskeletal: No aggressive osseous lesion. Review of the MIP images confirms the above findings.  IMPRESSION: 1. No evidence acute pulmonary embolism. 2. No acute pulmonary parenchymal findings. Electronically Signed   By: Jackquline Boxer M.D.   On: 02/22/2024 08:42  DG Chest Portable 1 View Result Date: 02/22/2024 CLINICAL DATA:  Chest pain and shortness of breath. EXAM: PORTABLE CHEST 1 VIEW COMPARISON:  02/15/2024 FINDINGS: Similar linear densities in both lung bases compatible with chronic atelectasis or scarring. No edema or new focal airspace consolidation. No substantial pleural effusion. The cardio pericardial silhouette is enlarged. Telemetry leads overlie the chest. IMPRESSION: Chronic atelectasis or scarring in the lung bases. No acute cardiopulmonary findings. Electronically Signed   By: Camellia Candle M.D.   On: 02/22/2024 06:23     PROCEDURES:  Critical Care performed: Yes, see critical care procedure note(s)   CRITICAL CARE Performed by: Josette Ayriel Texidor   Total critical care time: 30 minutes  Critical care time was exclusive of separately billable procedures and treating other patients.  Critical care was necessary to treat or prevent imminent or life-threatening deterioration.  Critical care was time spent personally by me on the following activities: development of treatment plan with patient and/or surrogate as well as nursing, discussions with consultants, evaluation of patient's response to treatment, examination of patient, obtaining history from patient or surrogate, ordering and performing treatments and interventions, ordering and review of laboratory studies, ordering and review of radiographic studies, pulse oximetry and re-evaluation of patient's condition.   SABRA1-3 Lead EKG Interpretation  Performed by: Eriana Suliman, Josette SAILOR, DO Authorized by: Edana Aguado, Josette SAILOR, DO     Interpretation: normal     ECG rate:  76   ECG rate assessment: normal     Rhythm: sinus rhythm     Ectopy: none     Conduction: normal       IMPRESSION / MDM / ASSESSMENT AND PLAN / ED COURSE   I reviewed the triage vital signs and the nursing notes.    Patient here with chest pain with significant cardiac history.  The patient is on the cardiac monitor to evaluate for evidence of arrhythmia and/or significant heart rate changes.   DIFFERENTIAL DIAGNOSIS (includes but not limited to):   ACS, PE, pneumonia, pneumothorax, dissection, chest wall pain   Patient's presentation is most consistent with acute presentation with potential threat to life or bodily function.   PLAN: Will obtain cardiac labs.  EKG shows no new ischemic change.  Will obtain chest x-ray.  Will give nitroglycerin , morphine  as needed for pain control.   MEDICATIONS GIVEN IN ED: Medications  nitroGLYCERIN  50 mg in dextrose  5 % 250 mL (0.2 mg/mL) infusion (20 mcg/min Intravenous Rate/Dose Verify 02/22/24 0918)  heparin  ADULT infusion 100 units/mL (25000 units/250mL) (1,050 Units/hr Intravenous New Bag/Given 02/22/24 0854)  morphine  (PF) 4 MG/ML injection 4 mg (4 mg Intravenous Given 02/22/24 0553)  ondansetron  (ZOFRAN ) injection 4 mg (4 mg Intravenous Given 02/22/24 0553)  heparin  bolus via infusion 4,000 Units (4,000 Units Intravenous Bolus from Bag 02/22/24 0855)  iohexol  (OMNIPAQUE ) 350 MG/ML injection 75 mL (75 mLs Intravenous Contrast Given 02/22/24 0817)  morphine  (PF) 4 MG/ML injection 4 mg (4 mg Intravenous Given 02/22/24 0835)     ED COURSE: Pain slowly improving with nitroglycerin  infusion, as needed morphine .  First troponin elevated at 115.  Second troponin pending to evaluate if this is rising or downtrending from recent NSTEMI.  Chest x-ray reviewed and interpreted by myself and the radiologist and is clear.  D-dimer elevated.  Will obtain CTA of the chest.  Have recommended admission given patient has significant cardiac history and is still having pain.  Patient and husband are comfortable with this plan.  Will discuss with the hospitalist.  Patient second troponin has gone up to 181.  CTA of the  chest reviewed and interpreted by myself and the radiologist and shows no PE.  CONSULTS:  Consulted and discussed patient's case with hospitalist, Dr. Laurita.  I have recommended admission and consulting physician agrees and will place admission orders.  Patient (and family if present) agree with this plan.   I reviewed all nursing notes, vitals, pertinent previous records.  All labs, EKGs, imaging ordered have been independently reviewed and interpreted by myself.    OUTSIDE RECORDS REVIEWED:     Cath 02/14/24:    Ost Cx to Prox Cx lesion is 85% stenosed.   Prox Cx to Mid Cx lesion is 100% stenosed.   Prox LAD lesion is 70% stenosed.   Prox LAD to Mid LAD lesion is 90% stenosed.   Ost RCA to Prox RCA lesion is 100% stenosed.   Origin lesion is 100% stenosed.   Mid Graft lesion is 100% stenosed.   Dist LAD lesion is 80% stenosed.   Origin to Prox Graft lesion is 100% stenosed.   The left ventricular systolic function is normal.   LV end diastolic pressure is normal.   The left ventricular ejection fraction is 50-55% by visual estimate.   1.  Severe three-vessel coronary artery disease with 90% stenosis proximal/mid LAD, 85% stenosis ostial/proximal left circumflex, 100% stenosis proximal/mid left circumflex, 100% stenosis ostial RCA. Ipsi-collaterals from OM1-OM2/OM 3, contra collaterals from LAD/septal perforators to distal RCA 2.  Chronically occluded SVG to distal RCA, chronically occluded SVG to D1, occluded SVG to OM 2, patent LIMA to distal LAD 3.  Normal left ventricular function   Recommendations   1.  Medical therapy 2.  Maximize medical therapy 3.  Aggressive risk factor modification    FINAL CLINICAL IMPRESSION(S) / ED DIAGNOSES   Final diagnoses:  Chest pain with high risk for cardiac etiology  NSTEMI (non-ST elevated myocardial infarction) (HCC)     Rx / DC Orders   ED Discharge Orders     None        Note:  This document was prepared using Dragon  voice recognition software and may include unintentional dictation errors.   Ameshia Pewitt, Josette SAILOR, [place] 02/22/24 [phone]

## 2024-02-22 NOTE — ED Notes (Signed)
IV team in room now

## 2024-02-22 NOTE — ED Triage Notes (Signed)
 Pt arrives to ED with c/o chest pain after having active chest pain at home for 30 minutes then calling EMS. Pt reports pain radiates down her left arm. Pt reports that she was in the hospital the previous week for a NSTEMI and this pain feels worse then that. Pt reports she did take 1 SL nitroglycerin  prior to calling EMS. Pt has also had 324 ASA. Pt rates pain 10/10.    Past Medical History:  Diagnosis Date   Asthma    Coronary artery disease    Diabetes mellitus without complication (HCC)    Heart attack (HCC)    Hyperlipidemia    Hypertension    MI (myocardial infarction) (HCC)    Migraine headache with aura    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

## 2024-02-23 ENCOUNTER — Inpatient Hospital Stay: Admitting: Physician Assistant

## 2024-02-23 DIAGNOSIS — I2510 Atherosclerotic heart disease of native coronary artery without angina pectoris: Secondary | ICD-10-CM

## 2024-02-23 DIAGNOSIS — E66812 Obesity, class 2: Secondary | ICD-10-CM | POA: Diagnosis not present

## 2024-02-23 LAB — CBC
HCT: 37.4 % (ref 36.0–46.0)
Hemoglobin: 11.9 g/dL — ABNORMAL LOW (ref 12.0–15.0)
MCH: 25.9 pg — ABNORMAL LOW (ref 26.0–34.0)
MCHC: 31.8 g/dL (ref 30.0–36.0)
MCV: 81.3 fL (ref 80.0–100.0)
Platelets: 261 K/uL (ref 150–400)
RBC: 4.6 MIL/uL (ref 3.87–5.11)
RDW: 16.8 % — ABNORMAL HIGH (ref 11.5–15.5)
WBC: 10.2 K/uL (ref 4.0–10.5)
nRBC: 0 % (ref 0.0–0.2)

## 2024-02-23 LAB — CBG MONITORING, ED
Glucose-Capillary: 136 mg/dL — ABNORMAL HIGH (ref 70–99)
Glucose-Capillary: 135 mg/dL — ABNORMAL HIGH (ref 70–99)
Glucose-Capillary: 125 mg/dL — ABNORMAL HIGH (ref 70–99)

## 2024-02-23 LAB — GLUCOSE, CAPILLARY
Glucose-Capillary: 165 mg/dL — ABNORMAL HIGH (ref 70–99)
Glucose-Capillary: 181 mg/dL — ABNORMAL HIGH (ref 70–99)

## 2024-02-23 LAB — HEPARIN LEVEL (UNFRACTIONATED): Heparin Unfractionated: 0.47 [IU]/mL (ref 0.30–0.70)

## 2024-02-23 NOTE — Progress Notes (Signed)
  Progress Note   Patient: Felicia Woods FMW:[id number] DOB: 10-Dec-1955 DOA: 02/22/2024     1 DOS: the patient was seen and examined on 02/23/2024   Brief hospital course:  Felicia Woods is a 68 y.o. female with medical history significant of three-vessel CAD status post CABG, with chronic angina, not amendable to stenting, with recent NSTEMI on medical management, HTN, HLD, presented with persistent chest pain.  This is the third time she came to the emergency room this month for chest pain. Patient was recently diagnosed with NSTEMI and LHC showed three-vessel CAD, not amenable for stenting and recommended by cardiology for home medical therapy.  Her troponin was 181, she was placed on heparin  drip.  Cardiology consult was obtained.    Principal Problem:   Angina at rest [facility]) Active Problems:   CAD (coronary artery disease)   Class 2 obesity   Chest pain   Asthma   Unstable angina (HCC)   Assessment and Plan: Unstable angina with known coronary disease. Mild elevation of troponin. Patient presents with chest pain, typical of angina.  Minimal elevation of troponin. Patient has known coronary disease, which is not amenable to additional intervention. Upon arrival, she was placed on heparin  drip, will continue for at least 48 hours. Metoprolol  and Ranexa . Have requested cardiology to see the patient today.  Class II obesity. Diet and exercise.  Asthma. No exacerbation       Subjective:  Patient still chest pain is better after giving morphine .  No shortness of breath.  Physical Exam: Vitals:   02/23/24 0531 02/23/24 0846 02/23/24 0930 02/23/24 1030  BP:  120/67 124/70 111/76  Pulse:  71 72 65  Resp:  16 15 12   Temp: 98.1 F (36.7 C) 97.9 F (36.6 C)    TempSrc: Oral Oral    SpO2:  96% 94% 95%  Weight:      Height:       General exam: Appears calm and comfortable, obesity Respiratory system: Clear to auscultation. Respiratory effort normal. Cardiovascular  system: S1 & S2 heard, RRR. No JVD, murmurs, rubs, gallops or clicks. No pedal edema. Gastrointestinal system: Abdomen is nondistended, soft and nontender. No organomegaly or masses felt. Normal bowel sounds heard. Central nervous system: Alert and oriented. No focal neurological deficits. Extremities: Symmetric 5 x 5 power. Skin: No rashes, lesions or ulcers Psychiatry: Judgement and insight appear normal. Mood & affect appropriate.    Data Reviewed:  Results reviewed.  Family Communication: None  Disposition: Status is: Inpatient Remains inpatient appropriate because: Heart disease, IV treatment     Time spent: 35 minutes  Author: Murvin Mana, MD 02/23/2024 11:52 AM  For on call review [email or web address].

## 2024-02-23 NOTE — Hospital Course (Signed)
 Felicia Woods is a 68 y.o. female with medical history significant of three-vessel CAD status post CABG, with chronic angina, not amendable to stenting, with recent NSTEMI on medical management, HTN, HLD, presented with persistent chest pain.  This is the third time she came to the emergency room this month for chest pain. Patient was recently diagnosed with NSTEMI and LHC showed three-vessel CAD, not amenable for stenting and recommended by cardiology for home medical therapy.  Her troponin was 181, she was placed on heparin  drip.  Cardiology consult was obtained. Patient has accompanied to 48 hours of a heparin  drip, seen by cardiology, added colchicine  as well as amlodipine .  Chest pain seems to be better today.  Medically stable for discharge.

## 2024-02-23 NOTE — Progress Notes (Signed)
 PHARMACY - ANTICOAGULATION CONSULT NOTE  Pharmacy Consult for Heparin  Infusion Indication: chest pain/ACS  Allergies  Allergen Reactions   Penicillin G Anaphylaxis and Other (See Comments)    Has patient had a PCN reaction causing immediate rash, facial/tongue/throat swelling, SOB or lightheadedness with hypotension: bzd:[id number]} Has patient had a PCN reaction causing severe rash involving mucus membranes or skin necrosis: no:[id number]} Has patient had a PCN reaction that required hospitalization no:[id number]} Has patient had a PCN reaction occurring within the last 10 years: no:[id number]} If all of the above answers are NO, then may proceed with Cephalosporin use.   [facility] (Diagnostic)     Patient Measurements: Height: 5' 7 (170.2 cm) Weight: 107.5 kg (236 lb 15.9 oz) IBW/kg (Calculated) : 61.6 HEPARIN  DW (KG): 86.1  Vital Signs: Temp: 98.3 F (36.8 C) (08/21 0148) Temp Source: Oral (08/21 0148) BP: 111/71 (08/21 0330) Pulse Rate: 70 (08/21 0330)  Labs: Recent Labs    02/22/24 0548 02/22/24 0656 02/22/24 0806 02/22/24 1455 02/22/24 2131 02/23/24 0320  HGB 12.8  --   --   --   --  11.9*  HCT 39.6  --   --   --   --  37.4  PLT 285  --   --   --   --  261  APTT  --   --  34  --   --   --   LABPROT  --   --  15.6*  --   --   --   INR  --   --  1.2  --   --   --   HEPARINUNFRC  --   --   --  0.74* 0.48 0.47  CREATININE  --  0.63  --   --   --   --   TROPONINIHS 115* 181*  --   --   --   --     Estimated Creatinine Clearance: 87.4 mL/min (by C-G formula based on SCr of 0.63 mg/dL).   Medical History: Past Medical History:  Diagnosis Date   Asthma    Coronary artery disease    Diabetes mellitus without complication (HCC)    Heart attack (HCC)    Hyperlipidemia    Hypertension    MI (myocardial infarction) (HCC)    Migraine headache with aura    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    Medications:  Not on anticoagulation per chart  review  Assessment: Patient is a 68 year old female with a past medical history of CAD s/p CABG x 4 with multiple stents, COPD, obesity, and diastolic dysfunction with normal ejection fraction who presented to ED with chest pain that radiates down her left arm. Troponin elevated at 115. Of note, patient was recently discharged on 8/12 after being treated for ACS, s/p left heart catheterization and advised for medical management. She was then re-admitted 8/13 for 8-9/10 chest pain, retrosternal, non-radiating, not associated with N/V. Plan was to maximize medical therapy and aggressive risk factor modification. Pharmacy consulted to initiate patient on a heparin  infusion for ACS/STEMI.  Baseline labs: INR- ordered aPTT- ordered Hgb- 12.8 PLT- 285  No signs/symptoms of bleeding per notes.  Goal of Therapy:  Heparin  level 0.3-0.7 units/ml Monitor platelets by anticoagulation protocol: Yes   8/20@1455 : HL 0.74, supratherapeutic@1050  units/hr 8/20@2131 : HL 0.48, therapeutic x 1 8/21 0320 HL 0.47, therapeutic x 2  Plan:  Continue heparin  infusion at 950 units/hr Recheck HL daily w/ AM labs while therapeutic Continue to monitor H&H  and platelets  Rankin CANDIE Dills, PharmD, [facility] 02/23/2024 4:19 AM

## 2024-02-23 NOTE — ED Notes (Signed)
 Patient resting at this time with eyes closed. RR even and unlabored.

## 2024-02-24 DIAGNOSIS — I2510 Atherosclerotic heart disease of native coronary artery without angina pectoris: Secondary | ICD-10-CM | POA: Diagnosis not present

## 2024-02-24 DIAGNOSIS — E66812 Obesity, class 2: Secondary | ICD-10-CM | POA: Diagnosis not present

## 2024-02-24 LAB — CBC
HCT: 36.4 % (ref 36.0–46.0)
Hemoglobin: 11.7 g/dL — ABNORMAL LOW (ref 12.0–15.0)
MCH: 26.1 pg (ref 26.0–34.0)
MCHC: 32.1 g/dL (ref 30.0–36.0)
MCV: 81.1 fL (ref 80.0–100.0)
Platelets: 277 K/uL (ref 150–400)
RBC: 4.49 MIL/uL (ref 3.87–5.11)
RDW: 16.7 % — ABNORMAL HIGH (ref 11.5–15.5)
WBC: 8.7 K/uL (ref 4.0–10.5)
nRBC: 0 % (ref 0.0–0.2)

## 2024-02-24 LAB — HEPARIN LEVEL (UNFRACTIONATED): Heparin Unfractionated: 0.48 [IU]/mL (ref 0.30–0.70)

## 2024-02-24 LAB — GLUCOSE, CAPILLARY
Glucose-Capillary: 126 mg/dL — ABNORMAL HIGH (ref 70–99)
Glucose-Capillary: 164 mg/dL — ABNORMAL HIGH (ref 70–99)
Glucose-Capillary: 128 mg/dL — ABNORMAL HIGH (ref 70–99)

## 2024-02-24 MED ORDER — COLCHICINE 0.6 MG PO TABS
0.6000 mg | ORAL_TABLET | Freq: Two times a day (BID) | ORAL | Status: DC
  Administered 2024-02-24: 0.6 mg via ORAL
  Filled 2024-02-24: qty 1

## 2024-02-24 MED ORDER — COLCHICINE 0.6 MG PO TABS: 0.6000 mg | ORAL_TABLET | Freq: Two times a day (BID) | ORAL | 0 refills | Status: AC

## 2024-02-24 MED ORDER — AMLODIPINE BESYLATE 5 MG PO TABS
5.0000 mg | ORAL_TABLET | Freq: Every day | ORAL | Status: DC
  Administered 2024-02-24: 5 mg via ORAL
  Filled 2024-02-24: qty 1

## 2024-02-24 MED ORDER — DILTIAZEM HCL ER COATED BEADS 120 MG PO CP24: 120.0000 mg | ORAL_CAPSULE | Freq: Every day | ORAL | Status: DC

## 2024-02-24 MED ORDER — DILTIAZEM HCL ER COATED BEADS 120 MG PO CP24: 120.0000 mg | ORAL_CAPSULE | Freq: Every day | ORAL | 0 refills | Status: DC

## 2024-02-24 MED ORDER — AMLODIPINE BESYLATE 5 MG PO TABS: 5.0000 mg | ORAL_TABLET | Freq: Every day | ORAL | 0 refills | Status: DC

## 2024-02-24 MED ORDER — MORPHINE SULFATE (PF) 2 MG/ML IV SOLN
2.0000 mg | Freq: Once | INTRAVENOUS | Status: AC
  Administered 2024-02-24: 2 mg via INTRAVENOUS
  Filled 2024-02-24: qty 1

## 2024-02-24 NOTE — Care Management Important Message (Signed)
 Important Message  Patient Details  Name: Felicia Woods MRN: [id number] Date of Birth: 09/09/1955   Important Message Given:  Yes - Medicare IM     Felicia Woods 02/24/2024, 1:18 PM

## 2024-02-24 NOTE — Progress Notes (Signed)
 PHARMACY - ANTICOAGULATION CONSULT NOTE  Pharmacy Consult for Heparin  Infusion Indication: chest pain/ACS  Allergies  Allergen Reactions   Penicillin G Anaphylaxis and Other (See Comments)    Has patient had a PCN reaction causing immediate rash, facial/tongue/throat swelling, SOB or lightheadedness with hypotension: [id number]} Has patient had a PCN reaction causing severe rash involving mucus membranes or skin necrosis: no:[id number]} Has patient had a PCN reaction that required hospitalization no:[id number]} Has patient had a PCN reaction occurring within the last 10 years: no:[id number]} If all of the above answers are NO, then may proceed with Cephalosporin use.   [facility] (Diagnostic)     Patient Measurements: Height: 5' 7 (170.2 cm) Weight: 107.5 kg (236 lb 15.9 oz) IBW/kg (Calculated) : 61.6 HEPARIN  DW (KG): 86.1  Vital Signs: Temp: 98 F (36.7 C) (08/22 0423) BP: 103/63 (08/22 0425) Pulse Rate: 64 (08/22 0425)  Labs: Recent Labs    02/22/24 0548 02/22/24 0656 02/22/24 0806 02/22/24 1455 02/22/24 2131 02/23/24 0320 02/24/24 0547  HGB 12.8  --   --   --   --  11.9* 11.7*  HCT 39.6  --   --   --   --  37.4 36.4  PLT 285  --   --   --   --  261 277  APTT  --   --  34  --   --   --   --   LABPROT  --   --  15.6*  --   --   --   --   INR  --   --  1.2  --   --   --   --   HEPARINUNFRC  --   --   --    < > 0.48 0.47 0.48  CREATININE  --  0.63  --   --   --   --   --   TROPONINIHS 115* 181*  --   --   --   --   --    < > = values in this interval not displayed.    Estimated Creatinine Clearance: 87.4 mL/min (by C-G formula based on SCr of 0.63 mg/dL).   Medical History: Past Medical History:  Diagnosis Date   Asthma    Coronary artery disease    Diabetes mellitus without complication (HCC)    Heart attack (HCC)    Hyperlipidemia    Hypertension    MI (myocardial infarction) (HCC)    Migraine headache with aura    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     Medications:  Not on anticoagulation per chart review  Assessment: Patient is a 68 year old female with a past medical history of CAD s/p CABG x 4 with multiple stents, COPD, obesity, and diastolic dysfunction with normal ejection fraction who presented to ED with chest pain that radiates down her left arm. Troponin elevated at 115. Of note, patient was recently discharged on 8/12 after being treated for ACS, s/p left heart catheterization and advised for medical management. She was then re-admitted 8/13 for 8-9/10 chest pain, retrosternal, non-radiating, not associated with N/V. Plan was to maximize medical therapy and aggressive risk factor modification. Pharmacy consulted to initiate patient on a heparin  infusion for ACS/STEMI. CBC stable.    Goal of Therapy:  Heparin  level 0.3-0.7 units/ml Monitor platelets by anticoagulation protocol: Yes   8/20@1455 : HL 0.74, supratherapeutic@1050  units/hr 8/20@2131 : HL 0.48, therapeutic x 1 8/21 0320 HL 0.47, therapeutic x 2 0822 0547 HL 0.48.  Plan:  Continue heparin  infusion at 950 units/hr Recheck HL daily w/ AM labs while therapeutic Continue to monitor H&H and platelets  Cathaleen Blanch, PharmD 02/24/2024 7:34 AM

## 2024-02-24 NOTE — Plan of Care (Signed)

## 2024-02-24 NOTE — Discharge Summary (Addendum)
 Physician Discharge Summary   Patient: Felicia Woods MRN: [id number] DOB: 1956-06-23  Admit date:     02/22/2024  Discharge date: 02/24/24  Discharge Physician: Murvin Mana   PCP: Kristina Tinnie POUR, PA-C   Recommendations at discharge:   Follow-up with PCP in 1 week. Follow-up with cardiology in 1 week.  Discharge Diagnoses: Principal Problem:   Angina at rest [facility]) Active Problems:   CAD (coronary artery disease)   Class 2 obesity   Chest pain   Asthma   Unstable angina (HCC)  Resolved Problems:   * No resolved hospital problems. *  Hospital Course:  Felicia Woods is a 68 y.o. female with medical history significant of three-vessel CAD status post CABG, with chronic angina, not amendable to stenting, with recent NSTEMI on medical management, HTN, HLD, presented with persistent chest pain.  This is the third time she came to the emergency room this month for chest pain. Patient was recently diagnosed with NSTEMI and LHC showed three-vessel CAD, not amenable for stenting and recommended by cardiology for home medical therapy.  Her troponin was 181, she was placed on heparin  drip.  Cardiology consult was obtained. Patient has accompanied to 48 hours of a heparin  drip, seen by cardiology, added colchicine  as well as amlodipine .  Chest pain seems to be better today.  Medically stable for discharge.  Assessment and Plan: Unstable angina with known coronary disease. Mild elevation of troponin secondary to demand ischemia. Patient presents with chest pain, typical of angina.  Minimal elevation of troponin. Patient has known coronary disease, which is not amenable to additional intervention. Upon arrival, she was placed on heparin  drip, so far has completed 48 hours. Metoprolol  and Ranexa  continued. Patient is seen by Dr. Florencio, added amlodipine  as well as colchicine .  Discussed with him, patient medically stable for discharge.  Will follow-up with cardiology as  outpatient  Class II obesity.  BMI 37.12. Diet and exercise.   Asthma. No exacerbation          Consultants: Cardiology Procedures performed: None  Disposition: Home Diet recommendation:  Discharge Diet Orders (From admission, onward)     Start     Ordered   02/24/24 0000  Diet - low sodium heart healthy        02/24/24 1504           Cardiac diet DISCHARGE MEDICATION: Allergies as of 02/24/2024       Reactions   Penicillin G Anaphylaxis, Other (See Comments)   Has patient had a PCN reaction causing immediate rash, facial/tongue/throat swelling, SOB or lightheadedness with hypotension: bzd:[id number]} Has patient had a PCN reaction causing severe rash involving mucus membranes or skin necrosis: no:[id number]} Has patient had a PCN reaction that required hospitalization no:[id number]} Has patient had a PCN reaction occurring within the last 10 years: no:[id number]} If all of the above answers are NO, then may proceed with Cephalosporin use.   Strawberry (diagnostic)         Medication List     TAKE these medications    acetaminophen  325 MG tablet Commonly known as: TYLENOL  Take 2 tablets (650 mg total) by mouth every 6 (six) hours as needed for mild pain (pain score 1-3) (or Fever >/= 101).   Adjust Bath/Shower Seat Misc 1 shower seat for use at home to reduce risk of falling.   amLODipine  5 MG tablet Commonly known as: NORVASC  Take 1 tablet (5 mg total) by mouth daily.   aspirin  EC 81 MG tablet Take 81 mg  by mouth daily.   atorvastatin  40 MG tablet Commonly known as: LIPITOR  Take 2 tablets (80 mg total) by mouth daily.   Breztri  Aerosphere 160-9-4.8 MCG/ACT Aero inhaler Generic drug: budesonide -glycopyrrolate -formoterol  Inhale 2 puffs into the lungs 2 (two) times daily.   clopidogrel  75 MG tablet Commonly known as: PLAVIX  Take 1 tablet (75 mg total) by mouth daily.   colchicine  0.6 MG tablet Take 1 tablet (0.6 mg total) by mouth 2 (two) times  daily for 14 days.   empagliflozin  25 MG Tabs tablet Commonly known as: Jardiance  Take one tab a day for diabetes   escitalopram  10 MG tablet Commonly known as: Lexapro  Take 1 tablet (10 mg total) by mouth daily.   ezetimibe  10 MG tablet Commonly known as: ZETIA  Take 1 tablet (10 mg total) by mouth daily.   famotidine  20 MG tablet Commonly known as: PEPCID  Take 1 tablet (20 mg total) by mouth 2 (two) times daily.   furosemide  40 MG tablet Commonly known as: LASIX  Take 1 tablet (40 mg total) by mouth daily.   hydrALAZINE  10 MG tablet Commonly known as: APRESOLINE  Take 1 tablet (10 mg total) by mouth 3 (three) times daily.   isosorbide  mononitrate 120 MG 24 hr tablet Commonly known as: IMDUR  Take 1 tablet (120 mg total) by mouth 2 (two) times daily.   losartan  100 MG tablet Commonly known as: COZAAR  Take 0.5 tablets (50 mg total) by mouth daily.   metoprolol  succinate 100 MG 24 hr tablet Commonly known as: TOPROL -XL Take 1 tablet (100 mg total) by mouth daily.   montelukast  10 MG tablet Commonly known as: SINGULAIR  Take 1 tablet (10 mg total) by mouth at bedtime.   nitroGLYCERIN  0.4 MG SL tablet Commonly known as: NITROSTAT  DISSOLVE ONE TABLET UNDER THE TONGUE EVERY 5 MINUTES AS NEEDED FOR CHEST PAIN.  DO NOT EXCEED A TOTAL OF 3 DOSES IN 15 MINUTES   OneTouch Delica Lancets 30G Misc USE 1  TO CHECK GLUCOSE TWICE DAILY AS DIRECTED   OneTouch Verio Flex System w/Device Kit Use as directed twice a daily DX E11.65   OneTouch Verio test strip Generic drug: glucose blood Use as instructed twice daily DX E11.65   oxybutynin  5 MG tablet Commonly known as: DITROPAN  Take 1 tablet (5 mg total) by mouth daily.   oxyCODONE  5 MG immediate release tablet Commonly known as: Oxy IR/ROXICODONE  Take 1 tablet (5 mg total) by mouth every 8 (eight) hours as needed for moderate pain (pain score 4-6).   ProAir  RespiClick 108 (90 Base) MCG/ACT Aepb Generic drug: Albuterol   Sulfate Inhale 2 puffs into the lungs every 6 (six) hours as needed.   ranolazine  1000 MG SR tablet Commonly known as: RANEXA  Take 1 tablet (1,000 mg total) by mouth 2 (two) times daily.   Repatha  SureClick 140 MG/ML Soaj Generic drug: Evolocumab  Inject 140 mg into the skin every 14 (fourteen) days.   tirzepatide  2.5 MG/0.5ML Pen Commonly known as: MOUNJARO  Inject 2.5 mg into the skin once a week.   zolpidem  5 MG tablet Commonly known as: AMBIEN  Take 1 tablet (5 mg total) by mouth at bedtime as needed. for sleep        Follow-up Information     McDonough, Tinnie POUR, PA-C Follow up in 1 week(s).   Specialty: Physician Assistant Contact information: [address] [place] [address] [phone]         Florencio Cara BIRCH, MD Follow up in 1 week(s).   Specialties: Cardiology, Internal Medicine Contact  information: [address] [place] [address] [phone]                Discharge Exam: Fredricka Weights   02/22/24 0555  Weight: 107.5 kg   General exam: Appears calm and comfortable, obese Respiratory system: Clear to auscultation. Respiratory effort normal. Cardiovascular system: S1 & S2 heard, RRR. No JVD, murmurs, rubs, gallops or clicks. No pedal edema. Gastrointestinal system: Abdomen is nondistended, soft and nontender. No organomegaly or masses felt. Normal bowel sounds heard. Central nervous system: Alert and oriented. No focal neurological deficits. Extremities: Symmetric 5 x 5 power. Skin: No rashes, lesions or ulcers Psychiatry: Judgement and insight appear normal. Mood & affect appropriate.    Condition at discharge: good  The results of significant diagnostics from this hospitalization (including imaging, microbiology, ancillary and laboratory) are listed below for reference.   Imaging Studies: CT Angio Chest PE W and/or Wo Contrast Result Date: 02/22/2024 CLINICAL DATA:  Concern for acute pulmonary embolism.  Chest pain EXAM:  CT ANGIOGRAPHY CHEST WITH CONTRAST TECHNIQUE: Multidetector CT imaging of the chest was performed using the standard protocol during bolus administration of intravenous contrast. Multiplanar CT image reconstructions and MIPs were obtained to evaluate the vascular anatomy. RADIATION DOSE REDUCTION: This exam was performed according to the departmental dose-optimization program which includes automated exposure control, adjustment of the mA and/or kV according to patient size and/or use of iterative reconstruction technique. CONTRAST:  75mL OMNIPAQUE  IOHEXOL  350 MG/ML SOLN COMPARISON:  None Available. FINDINGS: Cardiovascular: No filling defects within the pulmonary arteries to suggest acute pulmonary embolism. Mediastinum/Nodes: No axillary or supraclavicular adenopathy. No mediastinal or hilar adenopathy. No pericardial fluid. Esophagus normal. Lungs/Pleura: No suspicious pulmonary nodules. Normal pleural. Airways normal. No pulmonary infarction. No pneumonia. No pleural fluid. No pneumothorax Upper Abdomen: Limited view of the liver, kidneys, pancreas are unremarkable. Normal adrenal glands. Musculoskeletal: No aggressive osseous lesion. Review of the MIP images confirms the above findings. IMPRESSION: 1. No evidence acute pulmonary embolism. 2. No acute pulmonary parenchymal findings. Electronically Signed   By: Jackquline Boxer M.D.   On: 02/22/2024 08:42   DG Chest Portable 1 View Result Date: 02/22/2024 CLINICAL DATA:  Chest pain and shortness of breath. EXAM: PORTABLE CHEST 1 VIEW COMPARISON:  02/15/2024 FINDINGS: Similar linear densities in both lung bases compatible with chronic atelectasis or scarring. No edema or new focal airspace consolidation. No substantial pleural effusion. The cardio pericardial silhouette is enlarged. Telemetry leads overlie the chest. IMPRESSION: Chronic atelectasis or scarring in the lung bases. No acute cardiopulmonary findings. Electronically Signed   By: Camellia Candle M.D.    On: 02/22/2024 06:23   CARDIAC CATHETERIZATION Addendum Date: 02/15/2024   Ost Cx to Prox Cx lesion is 85% stenosed.   Prox Cx to Mid Cx lesion is 100% stenosed.   Prox LAD lesion is 70% stenosed.   Prox LAD to Mid LAD lesion is 90% stenosed.   Ost RCA to Prox RCA lesion is 100% stenosed.   Origin lesion is 100% stenosed.   Mid Graft lesion is 100% stenosed.   Dist LAD lesion is 80% stenosed.   Origin to Prox Graft lesion is 100% stenosed.   The left ventricular systolic function is normal.   LV end diastolic pressure is normal.   The left ventricular ejection fraction is 50-55% by visual estimate. 1.  Severe three-vessel coronary artery disease with 90% stenosis proximal/mid LAD, 85% stenosis ostial/proximal left circumflex, 100% stenosis proximal/mid left circumflex, 100% stenosis ostial RCA. Ipsi-collaterals  from OM1-OM2/OM 3, contra collaterals from LAD/septal perforators to distal RCA 2.  Chronically occluded SVG to distal RCA, chronically occluded SVG to D1, occluded SVG to OM 2, patent LIMA to distal LAD 3.  Normal left ventricular function Recommendations 1.  Medical therapy 2.  Maximize medical therapy 3.  Aggressive risk factor modification  Result Date: 02/15/2024   Ost Cx to Prox Cx lesion is 85% stenosed.   Prox Cx to Mid Cx lesion is 100% stenosed.   Prox LAD lesion is 70% stenosed.   Prox LAD to Mid LAD lesion is 90% stenosed.   Ost RCA to Prox RCA lesion is 100% stenosed.   Origin lesion is 100% stenosed.   Mid Graft lesion is 100% stenosed.   Dist LAD lesion is 80% stenosed.   The left ventricular systolic function is normal.   LV end diastolic pressure is normal.   The left ventricular ejection fraction is 50-55% by visual estimate. 1.  Severe three-vessel coronary artery disease with 90% stenosis proximal/mid LAD, 85% stenosis ostial/proximal left circumflex, 100% stenosis proximal/mid left circumflex, 100% stenosis ostial RCA. Ipsi-collaterals from OM1-OM2/OM 3, contra collaterals from  LAD/septal perforators to distal RCA 2.  Chronically occluded SVG to distal RCA, occluded SVG to OM 2, patent LIMA to distal LAD 3.  Normal left ventricular function Recommendations 1.  Medical therapy 2.  Maximize medical therapy 3.  Aggressive risk factor modification   DG Chest 2 View Result Date: 02/15/2024 CLINICAL DATA:  Chest pain EXAM: CHEST - 2 VIEW COMPARISON:  02/09/2024 FINDINGS: Cardiac shadow is enlarged but stable. Postsurgical changes are again seen. The lungs are well aerated bilaterally. Basilar atelectasis is noted bilaterally. No focal infiltrate or effusion is seen. IMPRESSION: Bibasilar atelectatic changes. Electronically Signed   By: Oneil Devonshire M.D.   On: 02/15/2024 02:34   ECHOCARDIOGRAM COMPLETE Result Date: 02/11/2024    ECHOCARDIOGRAM REPORT   Patient Name:   Felicia Woods Date of Exam: 02/10/2024 Medical Rec #:  [id number]      Height:       67.0 in Accession #:    [id number]     Weight:       223.0 lb Date of Birth:  Apr 03, 1956     BSA:          2.118 m Patient Age:    67 years       BP:           102/84 mmHg Patient Gender: F              HR:           61 bpm. Exam Location:  [facility] Procedure: 2D Echo, Cardiac Doppler and Color Doppler (Both Spectral and Color            Flow Doppler were utilized during procedure). Indications:     Chest Pain R07.9  History:         Patient has prior history of Echocardiogram examinations, most                  recent 04/07/2022. CHF, Previous Myocardial Infarction, Acute                  MI, CAD and Angina, Prior CABG, Signs/Symptoms:Shortness of                  Breath; Risk Factors:Diabetes and Dyslipidemia.  Sonographer:     Thea Norlander RCS Referring Phys:  [id number] GABRIELLA DECOSTE Diagnosing Phys: Dwayne D  Callwood MD IMPRESSIONS  1. Left ventricular ejection fraction, by estimation, is 55 to 60%. The left ventricle has normal function. The left ventricle has no regional wall motion abnormalities. Left ventricular diastolic parameters  were normal.  2. Right ventricular systolic function is normal. The right ventricular size is normal.  3. The mitral valve is normal in structure. Trivial mitral valve regurgitation.  4. The aortic valve is normal in structure. Aortic valve regurgitation is not visualized. FINDINGS  Left Ventricle: Left ventricular ejection fraction, by estimation, is 55 to 60%. The left ventricle has normal function. The left ventricle has no regional wall motion abnormalities. Strain was performed and the global longitudinal strain is indeterminate. The left ventricular internal cavity size was normal in size. There is no left ventricular hypertrophy. Left ventricular diastolic parameters were normal. Right Ventricle: The right ventricular size is normal. No increase in right ventricular wall thickness. Right ventricular systolic function is normal. Left Atrium: Left atrial size was normal in size. Right Atrium: Right atrial size was normal in size. Pericardium: There is no evidence of pericardial effusion. Mitral Valve: The mitral valve is normal in structure. Trivial mitral valve regurgitation. Tricuspid Valve: The tricuspid valve is normal in structure. Tricuspid valve regurgitation is not demonstrated. Aortic Valve: The aortic valve is normal in structure. Aortic valve regurgitation is not visualized. Aortic valve peak gradient measures 4.2 mmHg. Pulmonic Valve: The pulmonic valve was normal in structure. Pulmonic valve regurgitation is not visualized. Aorta: The ascending aorta was not well visualized. IAS/Shunts: No atrial level shunt detected by color flow Doppler. Additional Comments: 3D was performed not requiring image post processing on an independent workstation and was indeterminate.  LEFT VENTRICLE PLAX 2D LVIDd:         3.80 cm   Diastology LVIDs:         2.70 cm   LV e' medial:    4.24 cm/s LV PW:         1.05 cm   LV E/e' medial:  15.4 LV IVS:        0.80 cm   LV e' lateral:   7.72 cm/s LVOT diam:     1.90 cm   LV  E/e' lateral: 8.5 LV SV:         37 LV SV Index:   17 LVOT Area:     2.84 cm  RIGHT VENTRICLE            IVC RV S prime:     4.20 cm/s  IVC diam: 1.60 cm LEFT ATRIUM         Index LA diam:    3.55 cm 1.68 cm/m  AORTIC VALVE AV Area (Vmax): 2.20 cm AV Vmax:        102.00 cm/s AV Peak Grad:   4.2 mmHg LVOT Vmax:      79.10 cm/s LVOT Vmean:     49.800 cm/s LVOT VTI:       0.129 m  AORTA Ao Root diam: 2.90 cm Ao Asc diam:  3.00 cm MITRAL VALVE MV Area (PHT): 3.99 cm    SHUNTS MV Decel Time: 190 msec    Systemic VTI:  0.13 m MV E velocity: 65.50 cm/s  Systemic Diam: 1.90 cm MV A velocity: 56.60 cm/s MV E/A ratio:  1.16 Dwayne JONETTA Lovelace MD Electronically signed by Cara JONETTA Lovelace MD Signature Date/Time: 02/11/2024/11:13:05 AM    Final    DG Chest Port 1 View Result Date: 02/09/2024 CLINICAL DATA:  Chest pain  EXAM: PORTABLE CHEST 1 VIEW COMPARISON:  04/06/2022 FINDINGS: Stable cardiomediastinal silhouette. Aortic atherosclerotic calcification. Sternotomy. Bibasilar airspace opacities favor atelectasis. No pleural effusion or pneumothorax. IMPRESSION: Bibasilar airspace opacities favor atelectasis. Electronically Signed   By: Norman Gatlin M.D.   On: 02/09/2024 22:14    Microbiology: Results for orders placed or performed in visit on 11/24/23  CULTURE, URINE COMPREHENSIVE     Status: None   Collection Time: 11/24/23  4:19 PM   Specimen: Urine   Urine  Result Value Ref Range Status   Urine Culture, Comprehensive Final report  Final   Organism ID, Bacteria Comment  Final    Comment: Mixed urogenital flora 10,000-25,000 colony forming units per mL     Labs: CBC: Recent Labs  Lab 02/22/24 0548 02/23/24 0320 02/24/24 0547  WBC 9.5 10.2 8.7  NEUTROABS 5.6  --   --   HGB 12.8 11.9* 11.7*  HCT 39.6 37.4 36.4  MCV 81.5 81.3 81.1  PLT 285 261 277   Basic Metabolic Panel: Recent Labs  Lab 02/22/24 0548 02/22/24 0656  NA  --  136  K  --  3.8  CL  --  103  CO2  --  25  GLUCOSE  --  191*   BUN  --  16  CREATININE  --  0.63  CALCIUM   --  9.1  MG 1.9  --    Liver Function Tests: Recent Labs  Lab 02/22/24 0656  AST 21  ALT 19  ALKPHOS 51  BILITOT 0.2  PROT 7.5  ALBUMIN 3.4*   CBG: Recent Labs  Lab 02/23/24 1631 02/23/24 1843 02/23/24 2155 02/24/24 0812 02/24/24 1147  GLUCAP 125* 165* 181* 126* 164*    Discharge time spent: 32 minutes.  Signed: Murvin Mana, MD [facility] 02/24/2024

## 2024-02-24 NOTE — Consult Note (Signed)
 CARDIOLOGY CONSULT NOTE               Patient ID: Felicia Woods MRN: [id number] DOB/AGE: 01-19-1956 68 y.o.  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 with chronic angina not amenable to intervention of bypass surgery treated medically recently had what appeared to be a non-STEMI and hypertension treated conservatively but had a cardiac cath which showed multivessel disease but no targets amenable for intervention patient was discharged home but continued to have pain after she ran out of her pain medications so came back to the hospital patient appears to be reasonably stable EKGs unchanged troponins unchanged  Review of systems complete and found to be negative unless listed above     Past Medical History:  Diagnosis Date   Asthma    Coronary artery disease    Diabetes mellitus without complication (HCC)    Heart attack (HCC)    Hyperlipidemia    Hypertension    MI (myocardial infarction) (HCC)    Migraine headache with aura    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    Past Surgical History:  Procedure Laterality Date   ABDOMINAL HYSTERECTOMY     CARDIAC CATHETERIZATION     CARDIAC CATHETERIZATION N/A 10/18/2015   Procedure: Left Heart Cath and Cors/Grafts Angiography;  Surgeon: Dorn JINNY Lesches, MD;  Location: [facility] INVASIVE CV LAB;  Service: Cardiovascular;  Laterality: N/A;   CARDIAC CATHETERIZATION N/A 10/18/2015   Procedure: Coronary Stent Intervention;  Surgeon: Dorn JINNY Lesches, MD;  Location: [facility] INVASIVE CV LAB;  Service: Cardiovascular;  Laterality: N/A;   CARDIAC SURGERY     CHOLECYSTECTOMY     COLONOSCOPY WITH PROPOFOL  N/A 12/02/2021   Procedure: COLONOSCOPY WITH PROPOFOL ;   Surgeon: Therisa Bi, MD;  Location: [facility] ENDOSCOPY;  Service: Gastroenterology;  Laterality: N/A;   CORONARY ANGIOPLASTY     CORONARY ARTERY BYPASS GRAFT     4 vessels - 2010   CORONARY STENT INTERVENTION N/A 02/16/2017   Procedure: CORONARY STENT INTERVENTION;  Surgeon: Florencio Cara BIRCH, MD;  Location: [facility] INVASIVE CV LAB;  Service: Cardiovascular;  Laterality: N/A;   CORONARY STENT INTERVENTION N/A 02/13/2019   Procedure: CORONARY STENT INTERVENTION;  Surgeon: Mady Bruckner, MD;  Location: [facility] INVASIVE CV LAB;  Service: Cardiovascular;  Laterality: N/A;  SVG to RCA   CORONARY STENT INTERVENTION Left 03/08/2019   Procedure: CORONARY STENT INTERVENTION;  Surgeon: Florencio Cara BIRCH, MD;  Location: [facility] INVASIVE CV LAB;  Service: Cardiovascular;  Laterality: Left;   CORONARY STENT INTERVENTION N/A 11/04/2020   Procedure: CORONARY STENT INTERVENTION;  Surgeon: Mady Bruckner, MD;  Location: [facility] INVASIVE CV LAB;  Service: Cardiovascular;  Laterality: N/A;   CORONARY STENT INTERVENTION N/A 12/24/2021   Procedure: CORONARY STENT INTERVENTION;  Surgeon: Lawyer Bernardino Cough, MD;  Location: [facility] INVASIVE CV LAB;  Service: Cardiovascular;  Laterality: N/A;   ESOPHAGOGASTRODUODENOSCOPY N/A 12/02/2021   Procedure: ESOPHAGOGASTRODUODENOSCOPY (EGD);  Surgeon: Therisa Bi, MD;  Location: [facility] ENDOSCOPY;  Service: Gastroenterology;  Laterality: N/A;   LEFT HEART CATH AND CORONARY ANGIOGRAPHY Left 02/16/2017   Procedure: LEFT HEART CATH AND CORONARY ANGIOGRAPHY;  Surgeon: Hester Wolm JINNY, MD;  Location: [facility] INVASIVE CV LAB;  Service: Cardiovascular;  Laterality: Left;   LEFT HEART CATH AND CORONARY ANGIOGRAPHY Left 12/24/2021   Procedure: LEFT  HEART CATH AND CORONARY ANGIOGRAPHY;  Surgeon: Hester Wolm PARAS, MD;  Location: [facility] INVASIVE CV LAB;  Service: Cardiovascular;  Laterality: Left;   LEFT HEART CATH AND CORONARY ANGIOGRAPHY Left 06/17/2022   Procedure: LEFT HEART CATH AND CORONARY ANGIOGRAPHY;   Surgeon: Florencio Cara BIRCH, MD;  Location: [facility] INVASIVE CV LAB;  Service: Cardiovascular;  Laterality: Left;   LEFT HEART CATH AND CORS/GRAFTS ANGIOGRAPHY Left 11/23/2017   Procedure: LEFT HEART CATH AND CORS/GRAFTS ANGIOGRAPHY;  Surgeon: Hester Wolm PARAS, MD;  Location: [facility] INVASIVE CV LAB;  Service: Cardiovascular;  Laterality: Left;   LEFT HEART CATH AND CORS/GRAFTS ANGIOGRAPHY N/A 02/13/2019   Procedure: LEFT HEART CATH AND CORS/GRAFTS ANGIOGRAPHY;  Surgeon: Hester Wolm PARAS, MD;  Location: [facility] INVASIVE CV LAB;  Service: Cardiovascular;  Laterality: N/A;   LEFT HEART CATH AND CORS/GRAFTS ANGIOGRAPHY N/A 07/03/2019   Procedure: LEFT HEART CATH AND CORS/GRAFTS ANGIOGRAPHY;  Surgeon: Hester Wolm PARAS, MD;  Location: [facility] INVASIVE CV LAB;  Service: Cardiovascular;  Laterality: N/A;   LEFT HEART CATH AND CORS/GRAFTS ANGIOGRAPHY N/A 11/04/2020   Procedure: LEFT HEART CATH AND CORS/GRAFTS ANGIOGRAPHY;  Surgeon: Hester Wolm PARAS, MD;  Location: [facility] INVASIVE CV LAB;  Service: Cardiovascular;  Laterality: N/A;   LEFT HEART CATH AND CORS/GRAFTS ANGIOGRAPHY N/A 02/14/2024   Procedure: LEFT HEART CATH AND CORS/GRAFTS ANGIOGRAPHY;  Surgeon: Ammon Blunt, MD;  Location: [facility] INVASIVE CV LAB;  Service: Cardiovascular;  Laterality: N/A;    Medications Prior to Admission  Medication Sig Dispense Refill Last Dose/Taking   acetaminophen  (TYLENOL ) 325 MG tablet Take 2 tablets (650 mg total) by mouth every 6 (six) hours as needed for mild pain (pain score 1-3) (or Fever >/= 101).   Unknown   aspirin  EC 81 MG tablet Take 81 mg by mouth daily.   02/22/2024 Morning   atorvastatin  (LIPITOR ) 40 MG tablet Take 2 tablets (80 mg total) by mouth daily. 90 tablet 3 02/22/2024 Morning   Budeson-Glycopyrrol-Formoterol  (BREZTRI  AEROSPHERE) 160-9-4.8 MCG/ACT AERO Inhale 2 puffs into the lungs 2 (two) times daily. 10.7 g 11 02/22/2024 Morning   clopidogrel  (PLAVIX ) 75 MG tablet Take 1 tablet (75 mg total) by mouth daily.  90 tablet 0 02/22/2024 Morning   empagliflozin  (JARDIANCE ) 25 MG TABS tablet Take one tab a day for diabetes 90 tablet 3 02/22/2024 Morning   escitalopram  (LEXAPRO ) 10 MG tablet Take 1 tablet (10 mg total) by mouth daily. 30 tablet 2 02/22/2024 Morning   Evolocumab  (REPATHA  SURECLICK) 140 MG/ML SOAJ Inject 140 mg into the skin every 14 (fourteen) days. 2 mL 2 Past Week   ezetimibe  (ZETIA ) 10 MG tablet Take 1 tablet (10 mg total) by mouth daily. 30 tablet 0 02/22/2024 Morning   famotidine  (PEPCID ) 20 MG tablet Take 1 tablet (20 mg total) by mouth 2 (two) times daily. 90 tablet 1 02/22/2024 Morning   furosemide  (LASIX ) 40 MG tablet Take 1 tablet (40 mg total) by mouth daily. 180 tablet 1 02/22/2024 Morning   hydrALAZINE  (APRESOLINE ) 10 MG tablet Take 1 tablet (10 mg total) by mouth 3 (three) times daily. 90 tablet 2 02/22/2024 Morning   isosorbide  mononitrate (IMDUR ) 120 MG 24 hr tablet Take 1 tablet (120 mg total) by mouth 2 (two) times daily. 60 tablet 1 02/22/2024 Morning   losartan  (COZAAR ) 100 MG tablet Take 0.5 tablets (50 mg total) by mouth daily. 90 tablet 3 02/22/2024 Morning   metoprolol  succinate (TOPROL -XL) 100 MG 24 hr tablet Take 1 tablet (100 mg total) by mouth daily. 30 tablet 1  02/22/2024 Morning   montelukast  (SINGULAIR ) 10 MG tablet Take 1 tablet (10 mg total) by mouth at bedtime. 90 tablet 1 02/21/2024 Evening   nitroGLYCERIN  (NITROSTAT ) 0.4 MG SL tablet DISSOLVE ONE TABLET UNDER THE TONGUE EVERY 5 MINUTES AS NEEDED FOR CHEST PAIN.  DO NOT EXCEED A TOTAL OF 3 DOSES IN 15 MINUTES 30 tablet 3 Unknown   oxybutynin  (DITROPAN ) 5 MG tablet Take 1 tablet (5 mg total) by mouth daily. 90 tablet 1 02/21/2024 Evening   oxyCODONE  (OXY IR/ROXICODONE ) 5 MG immediate release tablet Take 1 tablet (5 mg total) by mouth every 8 (eight) hours as needed for moderate pain (pain score 4-6). 10 tablet 0 Unknown   PROAIR  RESPICLICK 108 (90 Base) MCG/ACT AEPB Inhale 2 puffs into the lungs every 6 (six) hours as needed.  3 each 1 Unknown   ranolazine  (RANEXA ) 1000 MG SR tablet Take 1 tablet (1,000 mg total) by mouth 2 (two) times daily. 60 tablet 2 02/22/2024 Morning   tirzepatide  (MOUNJARO ) 2.5 MG/0.5ML Pen Inject 2.5 mg into the skin once a week. 2 mL 2 Past Week   zolpidem  (AMBIEN ) 5 MG tablet Take 1 tablet (5 mg total) by mouth at bedtime as needed. for sleep 30 tablet 2 Unknown   Blood Glucose Monitoring Suppl (ONETOUCH VERIO FLEX SYSTEM) w/Device KIT Use as directed twice a daily DX E11.65 1 kit 0    glucose blood (ONETOUCH VERIO) test strip Use as instructed twice daily DX E11.65 100 each 3    Misc. Devices (ADJUST BATH/SHOWER SEAT) MISC 1 shower seat for use at home to reduce risk of falling. 1 each 0    OneTouch Delica Lancets 30G MISC USE 1  TO CHECK GLUCOSE TWICE DAILY AS DIRECTED 100 each 0    Social History   Socioeconomic History   Marital status: Widowed    Spouse name: lynwood   Number of children: Not on file   Years of education: Not on file   Highest education level: Not on file  Occupational History   Not on file  Tobacco Use   Smoking status: Never   Smokeless tobacco: Never  Vaping Use   Vaping status: Never Used  Substance and Sexual Activity   Alcohol use: No    Alcohol/week: 0.0 standard drinks of alcohol   Drug use: No   Sexual activity: Yes  Other Topics Concern   Not on file  Social History Narrative   Not on file   Social Drivers of Health   Financial Resource Strain: Low Risk  (01/27/2024)   Received from [facility]   Overall Financial Resource Strain (CARDIA)    Difficulty of Paying Living Expenses: Not very hard  Food Insecurity: No Food Insecurity (02/23/2024)   Hunger Vital Sign    Worried About Running Out of Food in the Last Year: Never true    Ran Out of Food in the Last Year: Never true  Recent Concern: Food Insecurity - Food Insecurity Present (01/27/2024)   Received from [facility]   Hunger Vital Sign    Within the  past 12 months, you worried that your food would run out before you got the money to buy more.: Sometimes true    Within the past 12 months, the food you bought just didn't last and you didn't have money to get more.: Sometimes true  Transportation Needs: No Transportation Needs (02/23/2024)   PRAPARE - Administrator, Civil Service (Medical): No  Lack of Transportation (Non-Medical): No  Physical Activity: Not on file  Stress: Not on file  Social Connections: Socially Integrated (02/23/2024)   Social Connection and Isolation Panel    Frequency of Communication with Friends and Family: More than three times a week    Frequency of Social Gatherings with Friends and Family: Never    Attends Religious Services: More than 4 times per year    Active Member of [employer] or Organizations: Yes    Attends Banker Meetings: Never    Marital Status: Married  Catering manager Violence: Not At Risk (02/23/2024)   Humiliation, Afraid, Rape, and Kick questionnaire    Fear of Current or Ex-Partner: No    Emotionally Abused: No    Physically Abused: No    Sexually Abused: No    Family History  Problem Relation Age of Onset   Diabetes Mother    Diabetes Father    Cancer Father    Diabetes Brother       Review of systems complete and found to be negative unless listed above      PHYSICAL EXAM  General: Well developed, well nourished, in no acute distress HEENT:  Normocephalic and atramatic Neck:  No JVD.  Lungs: Clear bilaterally to auscultation and percussion. Heart: HRRR . Normal S1 and S2 without gallops or murmurs.  Abdomen: Bowel sounds are positive, abdomen soft and non-tender  Msk:  Back normal, normal gait. Normal strength and tone for age. Extremities: No clubbing, cyanosis or edema.   Neuro: Alert and oriented X 3. Psych:  Good affect, responds appropriately  Labs:   Lab Results  Component Value Date   WBC 8.7 02/24/2024   HGB 11.7 (L) 02/24/2024    HCT 36.4 02/24/2024   MCV 81.1 02/24/2024   PLT 277 02/24/2024    Recent Labs  Lab 02/22/24 0656  NA 136  K 3.8  CL 103  CO2 25  BUN 16  CREATININE 0.63  CALCIUM  9.1  PROT 7.5  BILITOT 0.2  ALKPHOS 51  ALT 19  AST 21  GLUCOSE 191*   Lab Results  Component Value Date   TROPONINI <0.03 12/13/2017    Lab Results  Component Value Date   CHOL 203 (H) 02/10/2024   CHOL 167 09/02/2022   CHOL 243 (H) 09/11/2021   Lab Results  Component Value Date   HDL 42 02/10/2024   HDL 41 09/02/2022   HDL 50 09/11/2021   Lab Results  Component Value Date   LDLCALC 147 (H) 02/10/2024   LDLCALC 100 (H) 09/02/2022   LDLCALC 181 (H) 09/11/2021   Lab Results  Component Value Date   TRIG 70 02/10/2024   TRIG 128 09/02/2022   TRIG 62 09/11/2021   Lab Results  Component Value Date   CHOLHDL 4.8 02/10/2024   CHOLHDL 4.1 09/02/2022   CHOLHDL 4.9 09/11/2021   No results found for: LDLDIRECT    Radiology: CT Angio Chest PE W and/or Wo Contrast Result Date: 02/22/2024 CLINICAL DATA:  Concern for acute pulmonary embolism.  Chest pain EXAM: CT ANGIOGRAPHY CHEST WITH CONTRAST TECHNIQUE: Multidetector CT imaging of the chest was performed using the standard protocol during bolus administration of intravenous contrast. Multiplanar CT image reconstructions and MIPs were obtained to evaluate the vascular anatomy. RADIATION DOSE REDUCTION: This exam was performed according to the departmental dose-optimization program which includes automated exposure control, adjustment of the mA and/or kV according to patient size and/or use of iterative reconstruction technique. CONTRAST:  75mL  OMNIPAQUE  IOHEXOL  350 MG/ML SOLN COMPARISON:  None Available. FINDINGS: Cardiovascular: No filling defects within the pulmonary arteries to suggest acute pulmonary embolism. Mediastinum/Nodes: No axillary or supraclavicular adenopathy. No mediastinal or hilar adenopathy. No pericardial fluid. Esophagus normal.  Lungs/Pleura: No suspicious pulmonary nodules. Normal pleural. Airways normal. No pulmonary infarction. No pneumonia. No pleural fluid. No pneumothorax Upper Abdomen: Limited view of the liver, kidneys, pancreas are unremarkable. Normal adrenal glands. Musculoskeletal: No aggressive osseous lesion. Review of the MIP images confirms the above findings. IMPRESSION: 1. No evidence acute pulmonary embolism. 2. No acute pulmonary parenchymal findings. Electronically Signed   By: Jackquline Boxer M.D.   On: 02/22/2024 08:42   DG Chest Portable 1 View Result Date: 02/22/2024 CLINICAL DATA:  Chest pain and shortness of breath. EXAM: PORTABLE CHEST 1 VIEW COMPARISON:  02/15/2024 FINDINGS: Similar linear densities in both lung bases compatible with chronic atelectasis or scarring. No edema or new focal airspace consolidation. No substantial pleural effusion. The cardio pericardial silhouette is enlarged. Telemetry leads overlie the chest. IMPRESSION: Chronic atelectasis or scarring in the lung bases. No acute cardiopulmonary findings. Electronically Signed   By: Camellia Candle M.D.   On: 02/22/2024 06:23   CARDIAC CATHETERIZATION Addendum Date: 02/15/2024   Ost Cx to Prox Cx lesion is 85% stenosed.   Prox Cx to Mid Cx lesion is 100% stenosed.   Prox LAD lesion is 70% stenosed.   Prox LAD to Mid LAD lesion is 90% stenosed.   Ost RCA to Prox RCA lesion is 100% stenosed.   Origin lesion is 100% stenosed.   Mid Graft lesion is 100% stenosed.   Dist LAD lesion is 80% stenosed.   Origin to Prox Graft lesion is 100% stenosed.   The left ventricular systolic function is normal.   LV end diastolic pressure is normal.   The left ventricular ejection fraction is 50-55% by visual estimate. 1.  Severe three-vessel coronary artery disease with 90% stenosis proximal/mid LAD, 85% stenosis ostial/proximal left circumflex, 100% stenosis proximal/mid left circumflex, 100% stenosis ostial RCA. Ipsi-collaterals from OM1-OM2/OM 3, contra  collaterals from LAD/septal perforators to distal RCA 2.  Chronically occluded SVG to distal RCA, chronically occluded SVG to D1, occluded SVG to OM 2, patent LIMA to distal LAD 3.  Normal left ventricular function Recommendations 1.  Medical therapy 2.  Maximize medical therapy 3.  Aggressive risk factor modification  Result Date: 02/15/2024   Ost Cx to Prox Cx lesion is 85% stenosed.   Prox Cx to Mid Cx lesion is 100% stenosed.   Prox LAD lesion is 70% stenosed.   Prox LAD to Mid LAD lesion is 90% stenosed.   Ost RCA to Prox RCA lesion is 100% stenosed.   Origin lesion is 100% stenosed.   Mid Graft lesion is 100% stenosed.   Dist LAD lesion is 80% stenosed.   The left ventricular systolic function is normal.   LV end diastolic pressure is normal.   The left ventricular ejection fraction is 50-55% by visual estimate. 1.  Severe three-vessel coronary artery disease with 90% stenosis proximal/mid LAD, 85% stenosis ostial/proximal left circumflex, 100% stenosis proximal/mid left circumflex, 100% stenosis ostial RCA. Ipsi-collaterals from OM1-OM2/OM 3, contra collaterals from LAD/septal perforators to distal RCA 2.  Chronically occluded SVG to distal RCA, occluded SVG to OM 2, patent LIMA to distal LAD 3.  Normal left ventricular function Recommendations 1.  Medical therapy 2.  Maximize medical therapy 3.  Aggressive risk factor modification   DG Chest 2  View Result Date: 02/15/2024 CLINICAL DATA:  Chest pain EXAM: CHEST - 2 VIEW COMPARISON:  02/09/2024 FINDINGS: Cardiac shadow is enlarged but stable. Postsurgical changes are again seen. The lungs are well aerated bilaterally. Basilar atelectasis is noted bilaterally. No focal infiltrate or effusion is seen. IMPRESSION: Bibasilar atelectatic changes. Electronically Signed   By: Oneil Devonshire M.D.   On: 02/15/2024 02:34   ECHOCARDIOGRAM COMPLETE Result Date: 02/11/2024    ECHOCARDIOGRAM REPORT   Patient Name:   Felicia Woods Date of Exam: 02/10/2024 Medical Rec #:   [id number]      Height:       67.0 in Accession #:    [id number]     Weight:       223.0 lb Date of Birth:  1955/10/15     BSA:          2.118 m Patient Age:    67 years       BP:           102/84 mmHg Patient Gender: F              HR:           61 bpm. Exam Location:  [facility] Procedure: 2D Echo, Cardiac Doppler and Color Doppler (Both Spectral and Color            Flow Doppler were utilized during procedure). Indications:     Chest Pain R07.9  History:         Patient has prior history of Echocardiogram examinations, most                  recent 04/07/2022. CHF, Previous Myocardial Infarction, Acute                  MI, CAD and Angina, Prior CABG, Signs/Symptoms:Shortness of                  Breath; Risk Factors:Diabetes and Dyslipidemia.  Sonographer:     Thea Norlander RCS Referring Phys:  [id number] DORENE COMFORT Diagnosing Phys: Cara JONETTA Lovelace MD IMPRESSIONS  1. Left ventricular ejection fraction, by estimation, is 55 to 60%. The left ventricle has normal function. The left ventricle has no regional wall motion abnormalities. Left ventricular diastolic parameters were normal.  2. Right ventricular systolic function is normal. The right ventricular size is normal.  3. The mitral valve is normal in structure. Trivial mitral valve regurgitation.  4. The aortic valve is normal in structure. Aortic valve regurgitation is not visualized. FINDINGS  Left Ventricle: Left ventricular ejection fraction, by estimation, is 55 to 60%. The left ventricle has normal function. The left ventricle has no regional wall motion abnormalities. Strain was performed and the global longitudinal strain is indeterminate. The left ventricular internal cavity size was normal in size. There is no left ventricular hypertrophy. Left ventricular diastolic parameters were normal. Right Ventricle: The right ventricular size is normal. No increase in right ventricular wall thickness. Right ventricular systolic function is normal. Left Atrium:  Left atrial size was normal in size. Right Atrium: Right atrial size was normal in size. Pericardium: There is no evidence of pericardial effusion. Mitral Valve: The mitral valve is normal in structure. Trivial mitral valve regurgitation. Tricuspid Valve: The tricuspid valve is normal in structure. Tricuspid valve regurgitation is not demonstrated. Aortic Valve: The aortic valve is normal in structure. Aortic valve regurgitation is not visualized. Aortic valve peak gradient measures 4.2 mmHg. Pulmonic Valve: The pulmonic valve was normal in structure. Pulmonic  valve regurgitation is not visualized. Aorta: The ascending aorta was not well visualized. IAS/Shunts: No atrial level shunt detected by color flow Doppler. Additional Comments: 3D was performed not requiring image post processing on an independent workstation and was indeterminate.  LEFT VENTRICLE PLAX 2D LVIDd:         3.80 cm   Diastology LVIDs:         2.70 cm   LV e' medial:    4.24 cm/s LV PW:         1.05 cm   LV E/e' medial:  15.4 LV IVS:        0.80 cm   LV e' lateral:   7.72 cm/s LVOT diam:     1.90 cm   LV E/e' lateral: 8.5 LV SV:         37 LV SV Index:   17 LVOT Area:     2.84 cm  RIGHT VENTRICLE            IVC RV S prime:     4.20 cm/s  IVC diam: 1.60 cm LEFT ATRIUM         Index LA diam:    3.55 cm 1.68 cm/m  AORTIC VALVE AV Area (Vmax): 2.20 cm AV Vmax:        102.00 cm/s AV Peak Grad:   4.2 mmHg LVOT Vmax:      79.10 cm/s LVOT Vmean:     49.800 cm/s LVOT VTI:       0.129 m  AORTA Ao Root diam: 2.90 cm Ao Asc diam:  3.00 cm MITRAL VALVE MV Area (PHT): 3.99 cm    SHUNTS MV Decel Time: 190 msec    Systemic VTI:  0.13 m MV E velocity: 65.50 cm/s  Systemic Diam: 1.90 cm MV A velocity: 56.60 cm/s MV E/A ratio:  1.16 Tay Whitwell JONETTA Lovelace MD Electronically signed by Cara JONETTA Lovelace MD Signature Date/Time: 02/11/2024/11:13:05 AM    Final    DG Chest Port 1 View Result Date: 02/09/2024 CLINICAL DATA:  Chest pain EXAM: PORTABLE CHEST 1 VIEW  COMPARISON:  04/06/2022 FINDINGS: Stable cardiomediastinal silhouette. Aortic atherosclerotic calcification. Sternotomy. Bibasilar airspace opacities favor atelectasis. No pleural effusion or pneumothorax. IMPRESSION: Bibasilar airspace opacities favor atelectasis. Electronically Signed   By: Norman Gatlin M.D.   On: 02/09/2024 22:14    EKG: Normal sinus rhythm nonspecific ECG changes rate of 65  ASSESSMENT AND PLAN:  Chronic chest pain Multivessel coronary disease including coronary bypass surgery Obesity Hypertension Diabetes Hyperlipidemia . Plan Continue antianginals maximum medical therapy Imdur  Ranexa  beta-blocker calcium  blocker Chest pain unlikely cardiac will add colchicine  to help with inflammation and pain control Recent cardiac cath not an interventional or bypass candidate recommend aggressive medical therapy Refer patient to cardiac rehab She needs outpatient sleep apnea evaluation Continue diabetes management to control goal A1c less than 7 Follow-up with cardiology as an outpatient  Signed: Cara JONETTA Lovelace MD,  02/24/2024, 8:16 AM

## 2024-02-24 NOTE — Progress Notes (Signed)
 Patient admitted in [facility].  Recent heart cath showed no good targets.  Currently, she is not a candidate for intervention, CABG or PCI.  Prescription sent in for pain, possible non-cardiac chest pain.  She does have chronic angina, but I don't feel the angina is causing her current chest pain.

## 2024-02-24 NOTE — Progress Notes (Signed)
 Pt discharged to home. AVS discharge instructions provided to pt with all questions and concerns answered at this time. Teach-back technique utilized. Telemetry disconnected and notified. All PIVs removed, sites WDL. All pt belongings taken with pt, pt verified.

## 2024-03-12 ENCOUNTER — Encounter: Payer: Self-pay | Admitting: Physician Assistant

## 2024-03-12 ENCOUNTER — Ambulatory Visit (INDEPENDENT_AMBULATORY_CARE_PROVIDER_SITE_OTHER): Admitting: Physician Assistant

## 2024-03-12 VITALS — BP 130/70 | HR 64 | Temp 98.0°F | Resp 16 | Ht 67.0 in | Wt 236.0 lb

## 2024-03-12 DIAGNOSIS — I10 Essential (primary) hypertension: Secondary | ICD-10-CM | POA: Diagnosis not present

## 2024-03-12 DIAGNOSIS — I25119 Atherosclerotic heart disease of native coronary artery with unspecified angina pectoris: Secondary | ICD-10-CM | POA: Diagnosis not present

## 2024-03-12 DIAGNOSIS — E1159 Type 2 diabetes mellitus with other circulatory complications: Secondary | ICD-10-CM

## 2024-03-12 MED ORDER — OXYBUTYNIN CHLORIDE 5 MG PO TABS: 5.0000 mg | ORAL_TABLET | Freq: Every day | ORAL | 1 refills | Status: AC

## 2024-03-12 MED ORDER — EZETIMIBE 10 MG PO TABS: 10.0000 mg | ORAL_TABLET | Freq: Every day | ORAL | 1 refills | Status: AC

## 2024-03-12 MED ORDER — TIRZEPATIDE 5 MG/0.5ML ~~LOC~~ SOAJ: 5.0000 mg | SUBCUTANEOUS | 2 refills | Status: DC

## 2024-03-12 MED ORDER — AMLODIPINE BESYLATE 5 MG PO TABS: 5.0000 mg | ORAL_TABLET | Freq: Every day | ORAL | 1 refills | Status: AC

## 2024-03-12 NOTE — Progress Notes (Signed)
 [facility] [address] [place], [place] [address]  Internal MEDICINE  Office Visit Note  Patient Name: Felicia Woods  [id number]  [id number]  Date of Service: 03/12/2024     Chief Complaint  Patient presents with   Hospitalization Follow-up   Diabetes   Hypertension   Hyperlipidemia     HPI Pt is here for recent hospital follow up. -Saw Dr. Hilarie on 03/09/24 and is going to consult with his partner to address significant blockages -taking metoprolol  and isosorbide  BID, zetia  added,  -Needs refills -will contact cardiology about cardiac rehab phase 2 -left knee pain when she lays down, rash has been better than it was. Feels like knee locking up  Current Medication: Outpatient Encounter Medications as of 03/12/2024  Medication Sig Note   acetaminophen  (TYLENOL ) 325 MG tablet Take 2 tablets (650 mg total) by mouth every 6 (six) hours as needed for mild pain (pain score 1-3) (or Fever >/= 101).    amLODipine  (NORVASC ) 5 MG tablet Take 1 tablet (5 mg total) by mouth daily.    aspirin  EC 81 MG tablet Take 81 mg by mouth daily.    atorvastatin  (LIPITOR ) 40 MG tablet Take 2 tablets (80 mg total) by mouth daily.    Blood Glucose Monitoring Suppl (ONETOUCH VERIO FLEX SYSTEM) w/Device KIT Use as directed twice a daily DX E11.65    Budeson-Glycopyrrol-Formoterol  (BREZTRI  AEROSPHERE) 160-9-4.8 MCG/ACT AERO Inhale 2 puffs into the lungs 2 (two) times daily.    clopidogrel  (PLAVIX ) 75 MG tablet Take 1 tablet (75 mg total) by mouth daily.    colchicine  0.6 MG tablet Take 1 tablet (0.6 mg total) by mouth 2 (two) times daily for 14 days.    empagliflozin  (JARDIANCE ) 25 MG TABS tablet Take one tab a day for diabetes    escitalopram  (LEXAPRO ) 10 MG tablet Take 1 tablet (10 mg total) by mouth daily.    Evolocumab  (REPATHA  SURECLICK) 140 MG/ML SOAJ Inject 140 mg into the skin every 14 (fourteen) days.    ezetimibe  (ZETIA ) 10 MG tablet Take 1 tablet (10 mg total) by mouth daily.     famotidine  (PEPCID ) 20 MG tablet Take 1 tablet (20 mg total) by mouth 2 (two) times daily.    furosemide  (LASIX ) 40 MG tablet Take 1 tablet (40 mg total) by mouth daily.    glucose blood (ONETOUCH VERIO) test strip Use as instructed twice daily DX E11.65    hydrALAZINE  (APRESOLINE ) 10 MG tablet Take 1 tablet (10 mg total) by mouth 3 (three) times daily.    isosorbide  mononitrate (IMDUR ) 120 MG 24 hr tablet Take 1 tablet (120 mg total) by mouth 2 (two) times daily.    losartan  (COZAAR ) 100 MG tablet Take 0.5 tablets (50 mg total) by mouth daily.    metoprolol  succinate (TOPROL -XL) 100 MG 24 hr tablet Take 1 tablet (100 mg total) by mouth daily.    Misc. Devices (ADJUST BATH/SHOWER SEAT) MISC 1 shower seat for use at home to reduce risk of falling.    montelukast  (SINGULAIR ) 10 MG tablet Take 1 tablet (10 mg total) by mouth at bedtime.    nitroGLYCERIN  (NITROSTAT ) 0.4 MG SL tablet DISSOLVE ONE TABLET UNDER THE TONGUE EVERY 5 MINUTES AS NEEDED FOR CHEST PAIN.  DO NOT EXCEED A TOTAL OF 3 DOSES IN 15 MINUTES 02/15/2024: prn   OneTouch Delica Lancets 30G MISC USE 1  TO CHECK GLUCOSE TWICE DAILY AS DIRECTED    oxybutynin  (DITROPAN ) 5 MG tablet Take 1 tablet (5  mg total) by mouth daily.    oxyCODONE  (OXY IR/ROXICODONE ) 5 MG immediate release tablet Take 1 tablet (5 mg total) by mouth every 8 (eight) hours as needed for moderate pain (pain score 4-6).    PROAIR  RESPICLICK 108 (90 Base) MCG/ACT AEPB Inhale 2 puffs into the lungs every 6 (six) hours as needed. 02/15/2024: prn   ranolazine  (RANEXA ) 1000 MG SR tablet Take 1 tablet (1,000 mg total) by mouth 2 (two) times daily.    tirzepatide  (MOUNJARO ) 2.5 MG/0.5ML Pen Inject 2.5 mg into the skin once a week. 02/22/2024: WED   zolpidem  (AMBIEN ) 5 MG tablet Take 1 tablet (5 mg total) by mouth at bedtime as needed. for sleep 02/15/2024: prn   No facility-administered encounter medications on file as of 03/12/2024.    Surgical History: Past Surgical History:   Procedure Laterality Date   ABDOMINAL HYSTERECTOMY     CARDIAC CATHETERIZATION     CARDIAC CATHETERIZATION N/A 10/18/2015   Procedure: Left Heart Cath and Cors/Grafts Angiography;  Surgeon: Dorn JINNY Lesches, MD;  Location: [facility] INVASIVE CV LAB;  Service: Cardiovascular;  Laterality: N/A;   CARDIAC CATHETERIZATION N/A 10/18/2015   Procedure: Coronary Stent Intervention;  Surgeon: Dorn JINNY Lesches, MD;  Location: [facility] INVASIVE CV LAB;  Service: Cardiovascular;  Laterality: N/A;   CARDIAC SURGERY     CHOLECYSTECTOMY     COLONOSCOPY WITH PROPOFOL  N/A 12/02/2021   Procedure: COLONOSCOPY WITH PROPOFOL ;  Surgeon: Therisa Bi, MD;  Location: [facility] ENDOSCOPY;  Service: Gastroenterology;  Laterality: N/A;   CORONARY ANGIOPLASTY     CORONARY ARTERY BYPASS GRAFT     4 vessels - 2010   CORONARY STENT INTERVENTION N/A 02/16/2017   Procedure: CORONARY STENT INTERVENTION;  Surgeon: Florencio Cara BIRCH, MD;  Location: [facility] INVASIVE CV LAB;  Service: Cardiovascular;  Laterality: N/A;   CORONARY STENT INTERVENTION N/A 02/13/2019   Procedure: CORONARY STENT INTERVENTION;  Surgeon: Mady Bruckner, MD;  Location: [facility] INVASIVE CV LAB;  Service: Cardiovascular;  Laterality: N/A;  SVG to RCA   CORONARY STENT INTERVENTION Left 03/08/2019   Procedure: CORONARY STENT INTERVENTION;  Surgeon: Florencio Cara BIRCH, MD;  Location: [facility] INVASIVE CV LAB;  Service: Cardiovascular;  Laterality: Left;   CORONARY STENT INTERVENTION N/A 11/04/2020   Procedure: CORONARY STENT INTERVENTION;  Surgeon: Mady Bruckner, MD;  Location: [facility] INVASIVE CV LAB;  Service: Cardiovascular;  Laterality: N/A;   CORONARY STENT INTERVENTION N/A 12/24/2021   Procedure: CORONARY STENT INTERVENTION;  Surgeon: Lawyer Bernardino Cough, MD;  Location: [facility] INVASIVE CV LAB;  Service: Cardiovascular;  Laterality: N/A;   ESOPHAGOGASTRODUODENOSCOPY N/A 12/02/2021   Procedure: ESOPHAGOGASTRODUODENOSCOPY (EGD);  Surgeon: Therisa Bi, MD;  Location: [facility] ENDOSCOPY;  Service:  Gastroenterology;  Laterality: N/A;   LEFT HEART CATH AND CORONARY ANGIOGRAPHY Left 02/16/2017   Procedure: LEFT HEART CATH AND CORONARY ANGIOGRAPHY;  Surgeon: Hester Wolm JINNY, MD;  Location: [facility] INVASIVE CV LAB;  Service: Cardiovascular;  Laterality: Left;   LEFT HEART CATH AND CORONARY ANGIOGRAPHY Left 12/24/2021   Procedure: LEFT HEART CATH AND CORONARY ANGIOGRAPHY;  Surgeon: Hester Wolm JINNY, MD;  Location: [facility] INVASIVE CV LAB;  Service: Cardiovascular;  Laterality: Left;   LEFT HEART CATH AND CORONARY ANGIOGRAPHY Left 06/17/2022   Procedure: LEFT HEART CATH AND CORONARY ANGIOGRAPHY;  Surgeon: Florencio Cara BIRCH, MD;  Location: [facility] INVASIVE CV LAB;  Service: Cardiovascular;  Laterality: Left;   LEFT HEART CATH AND CORS/GRAFTS ANGIOGRAPHY Left 11/23/2017   Procedure: LEFT HEART CATH AND CORS/GRAFTS ANGIOGRAPHY;  Surgeon: Hester Wolm JINNY, MD;  Location: [facility] INVASIVE CV LAB;  Service: Cardiovascular;  Laterality: Left;   LEFT HEART CATH AND CORS/GRAFTS ANGIOGRAPHY N/A 02/13/2019   Procedure: LEFT HEART CATH AND CORS/GRAFTS ANGIOGRAPHY;  Surgeon: Hester Wolm PARAS, MD;  Location: [facility] INVASIVE CV LAB;  Service: Cardiovascular;  Laterality: N/A;   LEFT HEART CATH AND CORS/GRAFTS ANGIOGRAPHY N/A 07/03/2019   Procedure: LEFT HEART CATH AND CORS/GRAFTS ANGIOGRAPHY;  Surgeon: Hester Wolm PARAS, MD;  Location: [facility] INVASIVE CV LAB;  Service: Cardiovascular;  Laterality: N/A;   LEFT HEART CATH AND CORS/GRAFTS ANGIOGRAPHY N/A 11/04/2020   Procedure: LEFT HEART CATH AND CORS/GRAFTS ANGIOGRAPHY;  Surgeon: Hester Wolm PARAS, MD;  Location: [facility] INVASIVE CV LAB;  Service: Cardiovascular;  Laterality: N/A;   LEFT HEART CATH AND CORS/GRAFTS ANGIOGRAPHY N/A 02/14/2024   Procedure: LEFT HEART CATH AND CORS/GRAFTS ANGIOGRAPHY;  Surgeon: Ammon Blunt, MD;  Location: [facility] INVASIVE CV LAB;  Service: Cardiovascular;  Laterality: N/A;    Medical History: Past Medical History:  Diagnosis Date   Asthma     Coronary artery disease    Diabetes mellitus without complication (HCC)    Heart attack (HCC)    Hyperlipidemia    Hypertension    MI (myocardial infarction) (HCC)    Migraine headache with aura    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    Family History: Family History  Problem Relation Age of Onset   Diabetes Mother    Diabetes Father    Cancer Father    Diabetes Brother     Social History   Socioeconomic History   Marital status: Widowed    Spouse name: lynwood   Number of children: Not on file   Years of education: Not on file   Highest education level: Not on file  Occupational History   Not on file  Tobacco Use   Smoking status: Never   Smokeless tobacco: Never  Vaping Use   Vaping status: Never Used  Substance and Sexual Activity   Alcohol use: No    Alcohol/week: 0.0 standard drinks of alcohol   Drug use: No   Sexual activity: Yes  Other Topics Concern   Not on file  Social History Narrative   Not on file   Social Drivers of Health   Financial Resource Strain: Low Risk  (01/27/2024)   Received from [facility]   Overall Financial Resource Strain (CARDIA)    Difficulty of Paying Living Expenses: Not very hard  Food Insecurity: No Food Insecurity (02/23/2024)   Hunger Vital Sign    Worried About Running Out of Food in the Last Year: Never true    Ran Out of Food in the Last Year: Never true  Recent Concern: Food Insecurity - Food Insecurity Present (01/27/2024)   Received from [facility]   Hunger Vital Sign    Within the past 12 months, you worried that your food would run out before you got the money to buy more.: Sometimes true    Within the past 12 months, the food you bought just didn't last and you didn't have money to get more.: Sometimes true  Transportation Needs: No Transportation Needs (02/23/2024)   PRAPARE - Administrator, Civil Service (Medical): No    Lack of Transportation (Non-Medical): No   Physical Activity: Not on file  Stress: Not on file  Social Connections: Socially Integrated (02/23/2024)   Social Connection and Isolation Panel    Frequency of Communication with Friends and Family: More than three  times a week    Frequency of Social Gatherings with Friends and Family: Never    Attends Religious Services: More than 4 times per year    Active Member of [employer] or Organizations: Yes    Attends Banker Meetings: Never    Marital Status: Married  Catering manager Violence: Not At Risk (02/23/2024)   Humiliation, Afraid, Rape, and Kick questionnaire    Fear of Current or Ex-Partner: No    Emotionally Abused: No    Physically Abused: No    Sexually Abused: No      Review of Systems  Vital Signs: BP 130/70   Pulse 64   Temp 98 F (36.7 C)   Resp 16   Ht 5' 7 (1.702 m)   Wt 236 lb (107 kg)   SpO2 97%   BMI 36.96 kg/m    Physical Exam    Assessment/Plan:   General Counseling: Takeisha verbalizes understanding of the findings of todays visit and agrees with plan of treatment. I have discussed any further diagnostic evaluation that may be needed or ordered today. We also reviewed her medications today. she has been encouraged to call the office with any questions or concerns that should arise related to todays visit.    Counseling:    No orders of the defined types were placed in this encounter.   This patient was seen by Tinnie Pro, PA-C in collaboration with Dr. Sigrid Bathe as a part of collaborative care agreement.   I have reviewed all medical records from hospital follow up including radiology reports and consults from other physicians. Appropriate follow up diagnostics will be scheduled as needed. Patient/ Family understands the plan of treatment. Time spent*** minutes.   Dr Sigrid CHRISTELLA Bathe, MD Internal Medicine

## 2024-03-28 ENCOUNTER — Other Ambulatory Visit: Payer: Self-pay

## 2024-03-28 DIAGNOSIS — R0602 Shortness of breath: Secondary | ICD-10-CM

## 2024-04-03 ENCOUNTER — Telehealth: Payer: Self-pay | Admitting: Physician Assistant

## 2024-04-03 NOTE — Telephone Encounter (Signed)
 Received MR request from [employer]. All records have to be uploaded thru [employer] site. Gave request to Melecia-Toni

## 2024-04-04 ENCOUNTER — Ambulatory Visit: Payer: Medicare Other | Admitting: Internal Medicine

## 2024-04-04 DIAGNOSIS — R0602 Shortness of breath: Secondary | ICD-10-CM

## 2024-04-23 NOTE — Procedures (Signed)
 [facility] [address] [place], [address]    Complete Pulmonary Function Testing Interpretation:  FINDINGS:  The forced vital capacity is mildly decreased.  FEV1 is 1.58 L which is 72% of predicted and is mildly decreased.  FEV1 FVC ratio was normal.  Postbronchodilator there is no significant change in the FEV1.  Total lung capacity is moderately decreased.  Residual volume was decreased FRC was decreased.  DLCO was also moderately decreased.  IMPRESSION:  This study is consistent with moderate restrictive lung disease clinical correlation is recommended.  Felicia DELENA Bathe, MD [facility] Pulmonary Critical Care Medicine Sleep Medicine

## 2024-04-25 LAB — PULMONARY FUNCTION TEST

## 2024-04-26 ENCOUNTER — Other Ambulatory Visit: Payer: Self-pay

## 2024-04-26 ENCOUNTER — Encounter: Payer: Self-pay | Admitting: Emergency Medicine

## 2024-04-26 ENCOUNTER — Encounter: Attending: Internal Medicine | Admitting: Emergency Medicine

## 2024-04-26 DIAGNOSIS — I214 Non-ST elevation (NSTEMI) myocardial infarction: Secondary | ICD-10-CM

## 2024-04-26 DIAGNOSIS — I252 Old myocardial infarction: Secondary | ICD-10-CM | POA: Insufficient documentation

## 2024-04-26 DIAGNOSIS — Z5189 Encounter for other specified aftercare: Secondary | ICD-10-CM | POA: Insufficient documentation

## 2024-04-26 NOTE — Progress Notes (Signed)
 Initial phone call completed. Diagnosis can be found in [facility] 10/13. EP Orientation scheduled for 05/02/24 at 1:30 pm.

## 2024-04-30 ENCOUNTER — Ambulatory Visit: Admitting: Physician Assistant

## 2024-05-02 ENCOUNTER — Encounter

## 2024-05-02 VITALS — Ht 66.14 in | Wt 243.7 lb

## 2024-05-02 DIAGNOSIS — I252 Old myocardial infarction: Secondary | ICD-10-CM | POA: Diagnosis present

## 2024-05-02 DIAGNOSIS — I214 Non-ST elevation (NSTEMI) myocardial infarction: Secondary | ICD-10-CM

## 2024-05-02 DIAGNOSIS — Z5189 Encounter for other specified aftercare: Secondary | ICD-10-CM | POA: Diagnosis not present

## 2024-05-02 NOTE — Progress Notes (Signed)
 Cardiac Individual Treatment Plan  Patient Details  Name: Felicia Woods MRN: [id number] Date of Birth: 05-22-1956 Referring Provider:   Flowsheet Row Cardiac Rehab from 05/02/2024 in [facility] Cardiac and Pulmonary Rehab  Referring Provider Florencio Kava, MD    Initial Encounter Date:  Flowsheet Row Cardiac Rehab from 05/02/2024 in [facility] Cardiac and Pulmonary Rehab  Date 05/02/24    Visit Diagnosis: NSTEMI (non-ST elevated myocardial infarction) [facility])  Patient's Home Medications on Admission:  Current Outpatient Medications:    acetaminophen  (TYLENOL ) 325 MG tablet, Take 2 tablets (650 mg total) by mouth every 6 (six) hours as needed for mild pain (pain score 1-3) (or Fever >/= 101)., Disp: , Rfl:    amLODipine  (NORVASC ) 5 MG tablet, Take 1 tablet (5 mg total) by mouth daily., Disp: 90 tablet, Rfl: 1   aspirin  EC 81 MG tablet, Take 81 mg by mouth daily., Disp: , Rfl:    atorvastatin  (LIPITOR ) 40 MG tablet, Take 2 tablets (80 mg total) by mouth daily., Disp: 90 tablet, Rfl: 3   Blood Glucose Monitoring Suppl (ONETOUCH VERIO FLEX SYSTEM) w/Device KIT, Use as directed twice a daily DX E11.65, Disp: 1 kit, Rfl: 0   Budeson-Glycopyrrol-Formoterol  (BREZTRI  AEROSPHERE) 160-9-4.8 MCG/ACT AERO, Inhale 2 puffs into the lungs 2 (two) times daily., Disp: 10.7 g, Rfl: 11   clopidogrel  (PLAVIX ) 75 MG tablet, Take 1 tablet (75 mg total) by mouth daily., Disp: 90 tablet, Rfl: 0   colchicine  0.6 MG tablet, Take 1 tablet (0.6 mg total) by mouth 2 (two) times daily for 14 days. (Patient not taking: Reported on 04/26/2024), Disp: 28 tablet, Rfl: 0   empagliflozin  (JARDIANCE ) 25 MG TABS tablet, Take one tab a day for diabetes, Disp: 90 tablet, Rfl: 3   escitalopram  (LEXAPRO ) 10 MG tablet, Take 1 tablet (10 mg total) by mouth daily., Disp: 30 tablet, Rfl: 2   Evolocumab  (REPATHA  SURECLICK) 140 MG/ML SOAJ, Inject 140 mg into the skin every 14 (fourteen) days., Disp: 2 mL, Rfl: 2   ezetimibe  (ZETIA ) 10 MG  tablet, Take 1 tablet (10 mg total) by mouth daily., Disp: 90 tablet, Rfl: 1   famotidine  (PEPCID ) 20 MG tablet, Take 1 tablet (20 mg total) by mouth 2 (two) times daily., Disp: 90 tablet, Rfl: 1   furosemide  (LASIX ) 40 MG tablet, Take 1 tablet (40 mg total) by mouth daily. (Patient taking differently: Take 40 mg by mouth daily. Take 1-2 tablets (40-80mg  total) by mouth one daily), Disp: 180 tablet, Rfl: 1   glucose blood (ONETOUCH VERIO) test strip, Use as instructed twice daily DX E11.65, Disp: 100 each, Rfl: 3   hydrALAZINE  (APRESOLINE ) 10 MG tablet, Take 1 tablet (10 mg total) by mouth 3 (three) times daily. (Patient not taking: Reported on 04/26/2024), Disp: 90 tablet, Rfl: 2   isosorbide  mononitrate (IMDUR ) 120 MG 24 hr tablet, Take 1 tablet (120 mg total) by mouth 2 (two) times daily., Disp: 60 tablet, Rfl: 1   losartan  (COZAAR ) 100 MG tablet, Take 0.5 tablets (50 mg total) by mouth daily., Disp: 90 tablet, Rfl: 3   metoprolol  succinate (TOPROL -XL) 100 MG 24 hr tablet, Take 1 tablet (100 mg total) by mouth daily. (Patient taking differently: Take 100 mg by mouth 2 (two) times daily.), Disp: 30 tablet, Rfl: 1   Misc. Devices (ADJUST BATH/SHOWER SEAT) MISC, 1 shower seat for use at home to reduce risk of falling., Disp: 1 each, Rfl: 0   montelukast  (SINGULAIR ) 10 MG tablet, Take 1 tablet (10 mg total) by  mouth at bedtime., Disp: 90 tablet, Rfl: 1   nitroGLYCERIN  (NITROSTAT ) 0.4 MG SL tablet, DISSOLVE ONE TABLET UNDER THE TONGUE EVERY 5 MINUTES AS NEEDED FOR CHEST PAIN.  DO NOT EXCEED A TOTAL OF 3 DOSES IN 15 MINUTES (Patient taking differently: Place 0.4 mg under the tongue every 5 (five) minutes as needed for chest pain.), Disp: 30 tablet, Rfl: 3   OneTouch Delica Lancets 30G MISC, USE 1  TO CHECK GLUCOSE TWICE DAILY AS DIRECTED, Disp: 100 each, Rfl: 0   oxybutynin  (DITROPAN ) 5 MG tablet, Take 1 tablet (5 mg total) by mouth daily., Disp: 90 tablet, Rfl: 1   oxyCODONE  (OXY IR/ROXICODONE ) 5 MG  immediate release tablet, Take 1 tablet (5 mg total) by mouth every 8 (eight) hours as needed for moderate pain (pain score 4-6). (Patient not taking: Reported on 04/26/2024), Disp: 10 tablet, Rfl: 0   PROAIR  RESPICLICK 108 (90 Base) MCG/ACT AEPB, Inhale 2 puffs into the lungs every 6 (six) hours as needed., Disp: 3 each, Rfl: 1   ranolazine  (RANEXA ) 1000 MG SR tablet, Take 1 tablet (1,000 mg total) by mouth 2 (two) times daily., Disp: 60 tablet, Rfl: 2   tirzepatide  (MOUNJARO ) 5 MG/0.5ML Pen, Inject 5 mg into the skin once a week., Disp: 2 mL, Rfl: 2   zolpidem  (AMBIEN ) 5 MG tablet, Take 1 tablet (5 mg total) by mouth at bedtime as needed. for sleep, Disp: 30 tablet, Rfl: 2  Past Medical History: Past Medical History:  Diagnosis Date   Asthma    Coronary artery disease    Diabetes mellitus without complication (HCC)    Heart attack (HCC)    Hyperlipidemia    Hypertension    MI (myocardial infarction) (HCC)    Migraine headache with aura    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    Tobacco Use: Social History   Tobacco Use  Smoking Status Never  Smokeless Tobacco Never    Labs: Review Flowsheet  More data exists      Latest Ref Rng & Units 09/02/2022 12/17/2022 06/01/2023 12/19/2023 02/10/2024  Labs for ITP Cardiac and Pulmonary Rehab  Cholestrol 0 - 200 mg/dL 832  - - - 796   LDL (calc) 0 - 99 mg/dL 899  - - - 852   HDL-C >40 mg/dL 41  - - - 42   Trlycerides <150 mg/dL 871  - - - 70   Hemoglobin A1c 4.0 - 5.6 % - 7.5  7.6  7.6  -     Exercise Target Goals: Exercise Program Goal: Individual exercise prescription set using results from initial 6 min walk test and THRR while considering  patient's activity barriers and safety.   Exercise Prescription Goal: Initial exercise prescription builds to 30-45 minutes a day of aerobic activity, 2-3 days per week.  Home exercise guidelines will be given to patient during program as part of exercise prescription that the participant will  acknowledge.   Education: Aerobic Exercise: - Group verbal and visual presentation on the components of exercise prescription. Introduces F.I.T.T principle from ACSM for exercise prescriptions.  Reviews F.I.T.T. principles of aerobic exercise including progression. Written material provided at class time. Flowsheet Row Cardiac Rehab from 06/10/2022 in [facility] Cardiac and Pulmonary Rehab  Education need identified 02/08/22    Education: Resistance Exercise: - Group verbal and visual presentation on the components of exercise prescription. Introduces F.I.T.T principle from ACSM for exercise prescriptions  Reviews F.I.T.T. principles of resistance exercise including progression. Written material provided at  class time.    Education: Exercise & Equipment Safety: - Individual verbal instruction and demonstration of equipment use and safety with use of the equipment. Flowsheet Row Cardiac Rehab from 05/02/2024 in [facility] Cardiac and Pulmonary Rehab  Date 05/02/24  Educator MB  Instruction Review Code 1- Verbalizes Understanding    Education: Exercise Physiology & General Exercise Guidelines: - Group verbal and written instruction with models to review the exercise physiology of the cardiovascular system and associated critical values. Provides general exercise guidelines with specific guidelines to those with heart or lung disease. Written material provided at class time.   Education: Flexibility, Balance, Mind/Body Relaxation: - Group verbal and visual presentation with interactive activity on the components of exercise prescription. Introduces F.I.T.T principle from ACSM for exercise prescriptions. Reviews F.I.T.T. principles of flexibility and balance exercise training including progression. Also discusses the mind body connection.  Reviews various relaxation techniques to help reduce and manage stress (i.e. Deep breathing, progressive muscle relaxation, and visualization). Balance handout provided  to take home. Written material provided at class time.   Activity Barriers & Risk Stratification:  Activity Barriers & Cardiac Risk Stratification - 05/02/24 1544       Activity Barriers & Cardiac Risk Stratification   Activity Barriers Joint Problems;Chest Pain/Angina   right knee pain   Cardiac Risk Stratification High          6 Minute Walk:  6 Minute Walk     Row Name 05/02/24 1540         6 Minute Walk   Phase Initial     Distance 370 feet     Walk Time 3 minutes     # of Rest Breaks 1  2nd half of test     MPH 1.4     METS 1.16     RPE 13     Perceived Dyspnea  2     VO2 Peak 4.07     Symptoms Yes (comment)     Comments Chest pain 10/10 during walk at the 3 minute mark. Took 1 Nitroglycerin  and relieved with rest and did not continue walking     Resting HR 86 bpm     Resting BP 126/70     Resting Oxygen  Saturation  99 %     Exercise Oxygen  Saturation  during 6 min walk 94 %     Max Ex. HR 142 bpm     Max Ex. BP 130/84     2 Minute Post BP 110/82        Oxygen  Initial Assessment:   Oxygen  Re-Evaluation:   Oxygen  Discharge (Final Oxygen  Re-Evaluation):   Initial Exercise Prescription:  Initial Exercise Prescription - 05/02/24 1500       Date of Initial Exercise RX and Referring Provider   Date 05/02/24    Referring Provider Florencio Kava, MD      Oxygen    Maintain Oxygen  Saturation 88% or higher      Recumbant Bike   Level 1    RPM 50    Watts 15    Minutes 15    METs 1.16      NuStep   Level 1    SPM 80    Minutes 15    METs 1.16      T5 Nustep   Level 1    SPM 80    Minutes 15    METs 1.16      Biostep-RELP   Level 1    SPM 50  Minutes 15    METs 1.16      Prescription Details   Frequency (times per week) 3    Duration Progress to 30 minutes of continuous aerobic without signs/symptoms of physical distress      Intensity   THRR 40-80% of Max Heartrate 112-139    Ratings of Perceived Exertion 11-13    Perceived  Dyspnea 0-4      Progression   Progression Continue to progress workloads to maintain intensity without signs/symptoms of physical distress.      Resistance Training   Training Prescription Yes    Weight 4 lb    Reps 10-15          Perform Capillary Blood Glucose checks as needed.  Exercise Prescription Changes:   Exercise Prescription Changes     Row Name 05/02/24 1500             Response to Exercise   Blood Pressure (Admit) 126/70       Blood Pressure (Exercise) 130/84       Blood Pressure (Exit) 110/82       Heart Rate (Admit) 86 bpm       Heart Rate (Exercise) 142 bpm       Heart Rate (Exit) 97 bpm       Oxygen  Saturation (Admit) 99 %       Oxygen  Saturation (Exercise) 94 %       Oxygen  Saturation (Exit) 96 %       Rating of Perceived Exertion (Exercise) 13       Perceived Dyspnea (Exercise) 2       Symptoms Chest pain 10/10, took 1 nitroglycerin  at the 3 minute mark, relieved with rest, did not continue walking       Comments results         Progression   Average METs 1.16          Exercise Comments:   Exercise Goals and Review:   Exercise Goals     Row Name 05/02/24 1548             Exercise Goals   Increase Physical Activity Yes       Intervention Provide advice, education, support and counseling about physical activity/exercise needs.;Develop an individualized exercise prescription for aerobic and resistive training based on initial evaluation findings, risk stratification, comorbidities and participant's personal goals.       Expected Outcomes Short Term: Attend rehab on a regular basis to increase amount of physical activity.;Long Term: Add in home exercise to make exercise part of routine and to increase amount of physical activity.;Long Term: Exercising regularly at least 3-5 days a week.       Increase Strength and Stamina Yes       Intervention Provide advice, education, support and counseling about physical activity/exercise  needs.;Develop an individualized exercise prescription for aerobic and resistive training based on initial evaluation findings, risk stratification, comorbidities and participant's personal goals.       Expected Outcomes Short Term: Increase workloads from initial exercise prescription for resistance, speed, and METs.;Short Term: Perform resistance training exercises routinely during rehab and add in resistance training at home;Long Term: Improve cardiorespiratory fitness, muscular endurance and strength as measured by increased METs and functional capacity ( )       Able to understand and use rate of perceived exertion (RPE) scale Yes       Intervention Provide education and explanation on how to use RPE scale  Expected Outcomes Short Term: Able to use RPE daily in rehab to express subjective intensity level;Long Term:  Able to use RPE to guide intensity level when exercising independently       Able to understand and use Dyspnea scale Yes       Intervention Provide education and explanation on how to use Dyspnea scale       Expected Outcomes Short Term: Able to use Dyspnea scale daily in rehab to express subjective sense of shortness of breath during exertion;Long Term: Able to use Dyspnea scale to guide intensity level when exercising independently       Knowledge and understanding of Target Heart Rate Range (THRR) Yes       Intervention Provide education and explanation of THRR including how the numbers were predicted and where they are located for reference       Expected Outcomes Short Term: Able to state/look up THRR;Short Term: Able to use daily as guideline for intensity in rehab;Long Term: Able to use THRR to govern intensity when exercising independently       Able to check pulse independently Yes       Intervention Provide education and demonstration on how to check pulse in carotid and radial arteries.;Review the importance of being able to check your own pulse for safety during  independent exercise       Expected Outcomes Short Term: Able to explain why pulse checking is important during independent exercise;Long Term: Able to check pulse independently and accurately       Understanding of Exercise Prescription Yes       Intervention Provide education, explanation, and written materials on patient's individual exercise prescription       Expected Outcomes Short Term: Able to explain program exercise prescription;Long Term: Able to explain home exercise prescription to exercise independently          Exercise Goals Re-Evaluation :   Discharge Exercise Prescription (Final Exercise Prescription Changes):  Exercise Prescription Changes - 05/02/24 1500       Response to Exercise   Blood Pressure (Admit) 126/70    Blood Pressure (Exercise) 130/84    Blood Pressure (Exit) 110/82    Heart Rate (Admit) 86 bpm    Heart Rate (Exercise) 142 bpm    Heart Rate (Exit) 97 bpm    Oxygen  Saturation (Admit) 99 %    Oxygen  Saturation (Exercise) 94 %    Oxygen  Saturation (Exit) 96 %    Rating of Perceived Exertion (Exercise) 13    Perceived Dyspnea (Exercise) 2    Symptoms Chest pain 10/10, took 1 nitroglycerin  at the 3 minute mark, relieved with rest, did not continue walking    Comments results      Progression   Average METs 1.16          Nutrition:  Target Goals: Understanding of nutrition guidelines, daily intake of sodium 1500mg , cholesterol 200mg , calories 30% from fat and 7% or less from saturated fats, daily to have 5 or more servings of fruits and vegetables.  Education: Nutrition 1 -Group instruction provided by verbal, written material, interactive activities, discussions, models, and posters to present general guidelines for heart healthy nutrition including macronutrients, label reading, and promoting whole foods over processed counterparts. Education serves as pensions consultant of discussion of heart healthy eating for all. Written material provided at  class time.    Education: Nutrition 2 -Group instruction provided by verbal, written material, interactive activities, discussions, models, and posters to present general guidelines for heart  healthy nutrition including sodium, cholesterol, and saturated fat. Providing guidance of habit forming to improve blood pressure, cholesterol, and body weight. Written material provided at class time.     Biometrics:  Pre Biometrics - 05/02/24 1549       Pre Biometrics   Height 5' 6.14 (1.68 m)    Weight 243 lb 11.2 oz (110.5 kg)    Waist Circumference 48.2 inches    Hip Circumference 55.8 inches    Waist to Hip Ratio 0.86 %    BMI (Calculated) 39.17    Single Leg Stand 23 seconds           Nutrition Therapy Plan and Nutrition Goals:  Nutrition Therapy & Goals - 05/02/24 1549       Personal Nutrition Goals   Nutrition Goal will meet w/ RD on 11/5      Intervention Plan   Intervention Prescribe, educate and counsel regarding individualized specific dietary modifications aiming towards targeted core components such as weight, hypertension, lipid management, diabetes, heart failure and other comorbidities.    Expected Outcomes Short Term Goal: Understand basic principles of dietary content, such as calories, fat, sodium, cholesterol and nutrients.          Nutrition Assessments:  MEDIFICTS Score Key: >=70 Need to make dietary changes  40-70 Heart Healthy Diet <= 40 Therapeutic Level Cholesterol Diet  Flowsheet Row Cardiac Rehab from 02/08/2022 in [facility] Cardiac and Pulmonary Rehab  Picture Your Plate Total Score on Admission 41   Picture Your Plate Scores: <59 Unhealthy dietary pattern with much room for improvement. 41-50 Dietary pattern unlikely to meet recommendations for good health and room for improvement. 51-60 More healthful dietary pattern, with some room for improvement.  >60 Healthy dietary pattern, although there may be some specific behaviors that could be  improved.    Nutrition Goals Re-Evaluation:   Nutrition Goals Discharge (Final Nutrition Goals Re-Evaluation):   Psychosocial: Target Goals: Acknowledge presence or absence of significant depression and/or stress, maximize coping skills, provide positive support system. Participant is able to verbalize types and ability to use techniques and skills needed for reducing stress and depression.   Education: Stress, Anxiety, and Depression - Group verbal and visual presentation to define topics covered.  Reviews how body is impacted by stress, anxiety, and depression.  Also discusses healthy ways to reduce stress and to treat/manage anxiety and depression. Written material provided at class time. Flowsheet Row Cardiac Rehab from 06/10/2022 in [facility] Cardiac and Pulmonary Rehab  Education need identified 02/08/22    Education: Sleep Hygiene -Provides group verbal and written instruction about how sleep can affect your health.  Define sleep hygiene, discuss sleep cycles and impact of sleep habits. Review good sleep hygiene tips.   Initial Review & Psychosocial Screening:  Initial Psych Review & Screening - 04/26/24 1409       Initial Review   Current issues with Current Stress Concerns    Source of Stress Concerns Retirement/disability;Unable to perform yard/household activities    Comments reports stress due to uncontrolled pain and frustration that she is not able to get pain medication that has worked for her in the past      Family Dynamics   Good Support System? Yes      Barriers   Psychosocial barriers to participate in program There are no identifiable barriers or psychosocial needs.;The patient should benefit from training in stress management and relaxation.      Screening Interventions   Interventions Encouraged to exercise;To provide support  and resources with identified psychosocial needs;Provide feedback about the scores to participant    Expected Outcomes Short Term goal:  Utilizing psychosocial counselor, staff and physician to assist with identification of specific Stressors or current issues interfering with healing process. Setting desired goal for each stressor or current issue identified.;Long Term Goal: Stressors or current issues are controlled or eliminated.;Short Term goal: Identification and review with participant of any Quality of Life or Depression concerns found by scoring the questionnaire.;Long Term goal: The participant improves quality of Life and PHQ9 Scores as seen by post scores and/or verbalization of changes          Quality of Life Scores:   Scores of 19 and below usually indicate a poorer quality of life in these areas.  A difference of  2-3 points is a clinically meaningful difference.  A difference of 2-3 points in the total score of the Quality of Life Index has been associated with significant improvement in overall quality of life, self-image, physical symptoms, and general health in studies assessing change in quality of life.  PHQ-9: Review Flowsheet  More data exists      05/02/2024 12/19/2023 09/05/2023 12/17/2022 07/22/2022  Depression screen PHQ 2/9  Decreased Interest 2 0 0 0 0  Down, Depressed, Hopeless 2 0 0 0 0  PHQ - 2 Score 4 0 0 0 0  Altered sleeping 3 - - - -  Tired, decreased energy 2 - - - -  Change in appetite 2 - - - -  Feeling bad or failure about yourself  0 - - - -  Trouble concentrating 0 - - - -  Moving slowly or fidgety/restless 1 - - - -  Suicidal thoughts 0 - - - -  PHQ-9 Score 12 - - - -  Difficult doing work/chores Somewhat difficult - - - -   Interpretation of Total Score  Total Score Depression Severity:  1-4 = Minimal depression, 5-9 = Mild depression, 10-14 = Moderate depression, 15-19 = Moderately severe depression, 20-27 = Severe depression   Psychosocial Evaluation and Intervention:  Psychosocial Evaluation - 04/26/24 1414       Psychosocial Evaluation & Interventions   Interventions  Stress management education;Relaxation education;Encouraged to exercise with the program and follow exercise prescription    Comments Daryle is returning to cardiac rehab due to NSTEMI in August and continued, unrelieved chest pain. She expresses frustration that she is unable to acheive adequate pain control with Tylenol , which is what the doctors have encouraged her to use. She states that when she has chest pain, it also causes shortness of breath and left arm numbess and typically takes 15-20 minutes before she is able to return to what she was doing. She states these episodes regularly happen 5-6 times per day. Kayte's current job is watching her grandchildren each week. She does state that she is in need of a right knee replacement, however she has not been able to have one due to her cardiac issues.    Expected Outcomes Short: Attend cardiac rehab for education and exercise. Long: Develop and maintain positive self care habits.    Continue Psychosocial Services  Follow up required by staff          Psychosocial Re-Evaluation:   Psychosocial Discharge (Final Psychosocial Re-Evaluation):   Vocational Rehabilitation: Provide vocational rehab assistance to qualifying candidates.   Vocational Rehab Evaluation & Intervention:  Vocational Rehab - 04/26/24 1408       Initial Vocational Rehab Evaluation & Intervention  Assessment shows need for Vocational Rehabilitation No      Vocational Rehab Re-Evaulation   Comments no request for VR          Education: Education Goals: Education classes will be provided on a variety of topics geared toward better understanding of heart health and risk factor modification. Participant will state understanding/return demonstration of topics presented as noted by education test scores.  Learning Barriers/Preferences:  Learning Barriers/Preferences - 04/26/24 1408       Learning Barriers/Preferences   Learning Barriers Sight    Learning  Preferences Individual Instruction;Verbal Instruction          General Cardiac Education Topics:  AED/CPR: - Group verbal and written instruction with the use of models to demonstrate the basic use of the AED with the basic ABC's of resuscitation.   Test and Procedures: - Group verbal and visual presentation and models provide information about basic cardiac anatomy and function. Reviews the testing methods done to diagnose heart disease and the outcomes of the test results. Describes the treatment choices: Medical Management, Angioplasty, or Coronary Bypass Surgery for treating various heart conditions including Myocardial Infarction, Angina, Valve Disease, and Cardiac Arrhythmias. Written material provided at class time. Flowsheet Row Cardiac Rehab from 06/10/2022 in [place] Cardiac and Pulmonary Rehab  Education need identified 02/08/22  Date 06/10/22  Educator sb  Instruction Review Code 1- Verbalizes Understanding    Medication Safety: - Group verbal and visual instruction to review commonly prescribed medications for heart and lung disease. Reviews the medication, class of the drug, and side effects. Includes the steps to properly store meds and maintain the prescription regimen. Written material provided at class time.   Intimacy: - Group verbal instruction through game format to discuss how heart and lung disease can affect sexual intimacy. Written material provided at class time.   Know Your Numbers and Heart Failure: - Group verbal and visual instruction to discuss disease risk factors for cardiac and pulmonary disease and treatment options.  Reviews associated critical values for Overweight/Obesity, Hypertension, Cholesterol, and Diabetes.  Discusses basics of heart failure: signs/symptoms and treatments.  Introduces Heart Failure Zone chart for action plan for heart failure. Written material provided at class time.   Infection Prevention: - Provides verbal and written  material to individual with discussion of infection control including proper hand washing and proper equipment cleaning during exercise session. Flowsheet Row Cardiac Rehab from 05/02/2024 in [facility] Cardiac and Pulmonary Rehab  Date 05/02/24  Educator MB  Instruction Review Code 1- Verbalizes Understanding    Falls Prevention: - Provides verbal and written material to individual with discussion of falls prevention and safety. Flowsheet Row Cardiac Rehab from 05/02/2024 in [facility] Cardiac and Pulmonary Rehab  Date 05/02/24  Educator MB  Instruction Review Code 1- Verbalizes Understanding    Other: -Provides group and verbal instruction on various topics (see comments)   Knowledge Questionnaire Score:   Core Components/Risk Factors/Patient Goals at Admission:  Personal Goals and Risk Factors at Admission - 05/02/24 1550       Core Components/Risk Factors/Patient Goals on Admission    Weight Management Yes;Weight Loss    Intervention Weight Management: Develop a combined nutrition and exercise program designed to reach desired caloric intake, while maintaining appropriate intake of nutrient and fiber, sodium and fats, and appropriate energy expenditure required for the weight goal.;Weight Management: Provide education and appropriate resources to help participant work on and attain dietary goals.;Weight Management/Obesity: Establish reasonable short term and long term weight goals.;Obesity: Provide  education and appropriate resources to help participant work on and attain dietary goals.    Admit Weight 243 lb 11.2 oz (110.5 kg)    Goal Weight: Short Term 200 lb (90.7 kg)    Goal Weight: Long Term 180 lb (81.6 kg)    Expected Outcomes Short Term: Continue to assess and modify interventions until short term weight is achieved;Long Term: Adherence to nutrition and physical activity/exercise program aimed toward attainment of established weight goal;Weight Loss: Understanding of general  recommendations for a balanced deficit meal plan, which promotes 1-2 lb weight loss per week and includes a negative energy balance of [phone] kcal/d;Understanding recommendations for meals to include 15-35% energy as protein, 25-35% energy from fat, 35-60% energy from carbohydrates, less than 200mg  of dietary cholesterol, 20-35 gm of total fiber daily;Understanding of distribution of calorie intake throughout the day with the consumption of 4-5 meals/snacks    Diabetes Yes    Intervention Provide education about signs/symptoms and action to take for hypo/hyperglycemia.;Provide education about proper nutrition, including hydration, and aerobic/resistive exercise prescription along with prescribed medications to achieve blood glucose in normal ranges: Fasting glucose 65-99 mg/dL    Expected Outcomes Short Term: Participant verbalizes understanding of the signs/symptoms and immediate care of hyper/hypoglycemia, proper foot care and importance of medication, aerobic/resistive exercise and nutrition plan for blood glucose control.;Long Term: Attainment of HbA1C < 7%.    Hypertension Yes    Intervention Provide education on lifestyle modifcations including regular physical activity/exercise, weight management, moderate sodium restriction and increased consumption of fresh fruit, vegetables, and low fat dairy, alcohol moderation, and smoking cessation.;Monitor prescription use compliance.    Expected Outcomes Short Term: Continued assessment and intervention until BP is < 140/64mm HG in hypertensive participants. < 130/43mm HG in hypertensive participants with diabetes, heart failure or chronic kidney disease.;Long Term: Maintenance of blood pressure at goal levels.    Lipids Yes    Intervention Provide education and support for participant on nutrition & aerobic/resistive exercise along with prescribed medications to achieve LDL 70mg , HDL >40mg .    Expected Outcomes Short Term: Participant states understanding  of desired cholesterol values and is compliant with medications prescribed. Participant is following exercise prescription and nutrition guidelines.;Long Term: Cholesterol controlled with medications as prescribed, with individualized exercise RX and with personalized nutrition plan. Value goals: LDL < 70mg , HDL > 40 mg.    Stress Yes    Intervention Offer individual and/or small group education and counseling on adjustment to heart disease, stress management and health-related lifestyle change. Teach and support self-help strategies.;Refer participants experiencing significant psychosocial distress to appropriate mental health specialists for further evaluation and treatment. When possible, include family members and significant others in education/counseling sessions.    Expected Outcomes Short Term: Participant demonstrates changes in health-related behavior, relaxation and other stress management skills, ability to obtain effective social support, and compliance with psychotropic medications if prescribed.;Long Term: Emotional wellbeing is indicated by absence of clinically significant psychosocial distress or social isolation.          Education:Diabetes - Individual verbal and written instruction to review signs/symptoms of diabetes, desired ranges of glucose level fasting, after meals and with exercise. Acknowledge that pre and post exercise glucose checks will be done for 3 sessions at entry of program. Flowsheet Row Cardiac Rehab from 05/02/2024 in [facility] Cardiac and Pulmonary Rehab  Date 05/02/24  Educator MB  Instruction Review Code 1- Verbalizes Understanding    Core Components/Risk Factors/Patient Goals Review:    Core Components/Risk Factors/Patient Goals  at Discharge (Final Review):    ITP Comments:  ITP Comments     Row Name 04/26/24 1347 05/02/24 1540         ITP Comments Initial phone call completed. Diagnosis can be found in [facility] 10/13. EP Orientation scheduled for  05/02/24 at 1:30 pm. Completed and gym orientation for cardiac rehab. Initial ITP created and sent for review to Dr. Oneil Pinal, Medical Director.         Comments: Initial ITP

## 2024-05-02 NOTE — Patient Instructions (Signed)
 Patient Instructions  Patient Details  Name: Felicia Woods MRN: [id number] Date of Birth: 22-Sep-1955 Referring Provider:  Florencio Cara BIRCH, MD  Below are your personal goals for exercise, nutrition, and risk factors. Our goal is to help you stay on track towards obtaining and maintaining these goals. We will be discussing your progress on these goals with you throughout the program.  Initial Exercise Prescription:  Initial Exercise Prescription - 05/02/24 1500       Date of Initial Exercise RX and Referring Provider   Date 05/02/24    Referring Provider Florencio Cara, MD      Oxygen    Maintain Oxygen  Saturation 88% or higher      Recumbant Bike   Level 1    RPM 50    Watts 15    Minutes 15    METs 1.16      NuStep   Level 1    SPM 80    Minutes 15    METs 1.16      T5 Nustep   Level 1    SPM 80    Minutes 15    METs 1.16      Biostep-RELP   Level 1    SPM 50    Minutes 15    METs 1.16      Prescription Details   Frequency (times per week) 3    Duration Progress to 30 minutes of continuous aerobic without signs/symptoms of physical distress      Intensity   THRR 40-80% of Max Heartrate 112-139    Ratings of Perceived Exertion 11-13    Perceived Dyspnea 0-4      Progression   Progression Continue to progress workloads to maintain intensity without signs/symptoms of physical distress.      Resistance Training   Training Prescription Yes    Weight 4 lb    Reps 10-15          Exercise Goals: Frequency: Be able to perform aerobic exercise two to three times per week in program working toward 2-5 days per week of home exercise.  Intensity: Work with a perceived exertion of 11 (fairly light) - 15 (hard) while following your exercise prescription.  We will make changes to your prescription with you as you progress through the program.   Duration: Be able to do 30 to 45 minutes of continuous aerobic exercise in addition to a 5 minute warm-up and a 5  minute cool-down routine.   Nutrition Goals: Your personal nutrition goals will be established when you do your nutrition analysis with the dietician.  The following are general nutrition guidelines to follow: Cholesterol < 200mg /day Sodium < 1500mg /day Fiber: Women over 50 yrs - 21 grams per day  Personal Goals:  Personal Goals and Risk Factors at Admission - 05/02/24 1550       Core Components/Risk Factors/Patient Goals on Admission    Weight Management Yes;Weight Loss    Intervention Weight Management: Develop a combined nutrition and exercise program designed to reach desired caloric intake, while maintaining appropriate intake of nutrient and fiber, sodium and fats, and appropriate energy expenditure required for the weight goal.;Weight Management: Provide education and appropriate resources to help participant work on and attain dietary goals.;Weight Management/Obesity: Establish reasonable short term and long term weight goals.;Obesity: Provide education and appropriate resources to help participant work on and attain dietary goals.    Admit Weight 243 lb 11.2 oz (110.5 kg)    Goal Weight: Short Term 200 lb (90.7 kg)  Goal Weight: Long Term 180 lb (81.6 kg)    Expected Outcomes Short Term: Continue to assess and modify interventions until short term weight is achieved;Long Term: Adherence to nutrition and physical activity/exercise program aimed toward attainment of established weight goal;Weight Loss: Understanding of general recommendations for a balanced deficit meal plan, which promotes 1-2 lb weight loss per week and includes a negative energy balance of [phone] kcal/d;Understanding recommendations for meals to include 15-35% energy as protein, 25-35% energy from fat, 35-60% energy from carbohydrates, less than 200mg  of dietary cholesterol, 20-35 gm of total fiber daily;Understanding of distribution of calorie intake throughout the day with the consumption of 4-5 meals/snacks     Diabetes Yes    Intervention Provide education about signs/symptoms and action to take for hypo/hyperglycemia.;Provide education about proper nutrition, including hydration, and aerobic/resistive exercise prescription along with prescribed medications to achieve blood glucose in normal ranges: Fasting glucose 65-99 mg/dL    Expected Outcomes Short Term: Participant verbalizes understanding of the signs/symptoms and immediate care of hyper/hypoglycemia, proper foot care and importance of medication, aerobic/resistive exercise and nutrition plan for blood glucose control.;Long Term: Attainment of HbA1C < 7%.    Hypertension Yes    Intervention Provide education on lifestyle modifcations including regular physical activity/exercise, weight management, moderate sodium restriction and increased consumption of fresh fruit, vegetables, and low fat dairy, alcohol moderation, and smoking cessation.;Monitor prescription use compliance.    Expected Outcomes Short Term: Continued assessment and intervention until BP is < 140/64mm HG in hypertensive participants. < 130/67mm HG in hypertensive participants with diabetes, heart failure or chronic kidney disease.;Long Term: Maintenance of blood pressure at goal levels.    Lipids Yes    Intervention Provide education and support for participant on nutrition & aerobic/resistive exercise along with prescribed medications to achieve LDL 70mg , HDL >40mg .    Expected Outcomes Short Term: Participant states understanding of desired cholesterol values and is compliant with medications prescribed. Participant is following exercise prescription and nutrition guidelines.;Long Term: Cholesterol controlled with medications as prescribed, with individualized exercise RX and with personalized nutrition plan. Value goals: LDL < 70mg , HDL > 40 mg.    Stress Yes    Intervention Offer individual and/or small group education and counseling on adjustment to heart disease, stress management  and health-related lifestyle change. Teach and support self-help strategies.;Refer participants experiencing significant psychosocial distress to appropriate mental health specialists for further evaluation and treatment. When possible, include family members and significant others in education/counseling sessions.    Expected Outcomes Short Term: Participant demonstrates changes in health-related behavior, relaxation and other stress management skills, ability to obtain effective social support, and compliance with psychotropic medications if prescribed.;Long Term: Emotional wellbeing is indicated by absence of clinically significant psychosocial distress or social isolation.          Tobacco Use Initial Evaluation: Social History   Tobacco Use  Smoking Status Never  Smokeless Tobacco Never    Exercise Goals and Review:  Exercise Goals     Row Name 05/02/24 1548             Exercise Goals   Increase Physical Activity Yes       Intervention Provide advice, education, support and counseling about physical activity/exercise needs.;Develop an individualized exercise prescription for aerobic and resistive training based on initial evaluation findings, risk stratification, comorbidities and participant's personal goals.       Expected Outcomes Short Term: Attend rehab on a regular basis to increase amount of physical activity.;Long Term: Add in  home exercise to make exercise part of routine and to increase amount of physical activity.;Long Term: Exercising regularly at least 3-5 days a week.       Increase Strength and Stamina Yes       Intervention Provide advice, education, support and counseling about physical activity/exercise needs.;Develop an individualized exercise prescription for aerobic and resistive training based on initial evaluation findings, risk stratification, comorbidities and participant's personal goals.       Expected Outcomes Short Term: Increase workloads from initial  exercise prescription for resistance, speed, and METs.;Short Term: Perform resistance training exercises routinely during rehab and add in resistance training at home;Long Term: Improve cardiorespiratory fitness, muscular endurance and strength as measured by increased METs and functional capacity ( )       Able to understand and use rate of perceived exertion (RPE) scale Yes       Intervention Provide education and explanation on how to use RPE scale       Expected Outcomes Short Term: Able to use RPE daily in rehab to express subjective intensity level;Long Term:  Able to use RPE to guide intensity level when exercising independently       Able to understand and use Dyspnea scale Yes       Intervention Provide education and explanation on how to use Dyspnea scale       Expected Outcomes Short Term: Able to use Dyspnea scale daily in rehab to express subjective sense of shortness of breath during exertion;Long Term: Able to use Dyspnea scale to guide intensity level when exercising independently       Knowledge and understanding of Target Heart Rate Range (THRR) Yes       Intervention Provide education and explanation of THRR including how the numbers were predicted and where they are located for reference       Expected Outcomes Short Term: Able to state/look up THRR;Short Term: Able to use daily as guideline for intensity in rehab;Long Term: Able to use THRR to govern intensity when exercising independently       Able to check pulse independently Yes       Intervention Provide education and demonstration on how to check pulse in carotid and radial arteries.;Review the importance of being able to check your own pulse for safety during independent exercise       Expected Outcomes Short Term: Able to explain why pulse checking is important during independent exercise;Long Term: Able to check pulse independently and accurately       Understanding of Exercise Prescription Yes       Intervention  Provide education, explanation, and written materials on patient's individual exercise prescription       Expected Outcomes Short Term: Able to explain program exercise prescription;Long Term: Able to explain home exercise prescription to exercise independently

## 2024-05-05 ENCOUNTER — Other Ambulatory Visit: Payer: Self-pay | Admitting: Physician Assistant

## 2024-05-05 DIAGNOSIS — I10 Essential (primary) hypertension: Secondary | ICD-10-CM

## 2024-05-07 ENCOUNTER — Ambulatory Visit: Admitting: Physician Assistant

## 2024-05-09 ENCOUNTER — Encounter

## 2024-05-10 ENCOUNTER — Encounter: Payer: Self-pay | Admitting: Physician Assistant

## 2024-05-10 ENCOUNTER — Ambulatory Visit (INDEPENDENT_AMBULATORY_CARE_PROVIDER_SITE_OTHER): Admitting: Physician Assistant

## 2024-05-10 VITALS — BP 130/80 | HR 100 | Temp 98.3°F | Resp 16 | Ht 67.0 in | Wt 245.0 lb

## 2024-05-10 DIAGNOSIS — J4520 Mild intermittent asthma, uncomplicated: Secondary | ICD-10-CM

## 2024-05-10 DIAGNOSIS — I25119 Atherosclerotic heart disease of native coronary artery with unspecified angina pectoris: Secondary | ICD-10-CM

## 2024-05-10 DIAGNOSIS — I10 Essential (primary) hypertension: Secondary | ICD-10-CM | POA: Diagnosis not present

## 2024-05-10 DIAGNOSIS — F331 Major depressive disorder, recurrent, moderate: Secondary | ICD-10-CM | POA: Diagnosis not present

## 2024-05-10 DIAGNOSIS — E1159 Type 2 diabetes mellitus with other circulatory complications: Secondary | ICD-10-CM

## 2024-05-10 LAB — POCT GLYCOSYLATED HEMOGLOBIN (HGB A1C): Hemoglobin A1C: 7.8 % — AB (ref 4.0–5.6)

## 2024-05-10 MED ORDER — CLOPIDOGREL BISULFATE 75 MG PO TABS: 75.0000 mg | ORAL_TABLET | Freq: Every day | ORAL | 1 refills | Status: AC

## 2024-05-10 MED ORDER — TIRZEPATIDE 5 MG/0.5ML ~~LOC~~ SOAJ: 5.0000 mg | SUBCUTANEOUS | 2 refills | Status: AC

## 2024-05-10 MED ORDER — HYDROCODONE-ACETAMINOPHEN 5-325 MG PO TABS: ORAL_TABLET | ORAL | 0 refills | Status: DC

## 2024-05-10 MED ORDER — FUROSEMIDE 40 MG PO TABS: 40.0000 mg | ORAL_TABLET | Freq: Two times a day (BID) | ORAL | 1 refills | Status: AC

## 2024-05-10 MED ORDER — REPATHA SURECLICK 140 MG/ML ~~LOC~~ SOAJ: 140.0000 mg | SUBCUTANEOUS | 2 refills | Status: AC

## 2024-05-10 NOTE — Progress Notes (Signed)
 [facility] [address] [place], [place] [address]  Internal MEDICINE  Office Visit Note  Patient Name: Felicia Woods  [id number]  [id number]  Date of Service: 05/10/2024  Chief Complaint  Patient presents with   Follow-up    Review PFT   Diabetes   Hypertension   Hyperlipidemia    HPI Pt is here for routine follow up -PFT stable from last visit, has not gotten in to see pulmonology yet -Has not been able to get mounjaro  for a few months, pharmacy said they didn't have higher dose sent in Sept. Will send again and will give sample of lower dose to restart prior. -unable to have PCI, states cardiology is setting up a care plan with pain management and will see HF specialist on 18th. Starting cardiac rehab on Tues. Unsure about pain clinic referral? Nitroglycerin  does not help. 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 No aneursyms seen on any imaging since then -needs some refills on meds from the hospital that she is supposed to remain on -states she would like to start speaking with therapist or support group to help cope with all of her health concerns. Will place referral. States she does not want to adjust her meds  Current Medication: Outpatient Encounter Medications as of 05/10/2024  Medication Sig Note   amLODipine  (NORVASC ) 5 MG tablet Take 1 tablet (5 mg total) by mouth daily.    aspirin  EC 81 MG tablet Take 81 mg by mouth daily.    atorvastatin  (LIPITOR ) 40 MG tablet Take 2 tablets (80 mg total) by mouth daily.    Blood Glucose Monitoring Suppl (ONETOUCH VERIO FLEX SYSTEM) w/Device KIT Use as directed twice a daily DX E11.65    Budeson-Glycopyrrol-Formoterol  (BREZTRI  AEROSPHERE) 160-9-4.8 MCG/ACT AERO Inhale 2 puffs into the lungs 2 (two) times daily.    colchicine  0.6  MG tablet Take 1 tablet (0.6 mg total) by mouth 2 (two) times daily for 14 days.    empagliflozin  (JARDIANCE ) 25 MG TABS tablet Take one tab a day for diabetes    escitalopram  (LEXAPRO ) 10 MG tablet Take 1 tablet (10 mg total) by mouth daily.    ezetimibe  (ZETIA ) 10 MG tablet Take 1 tablet (10 mg total) by mouth daily.    famotidine  (PEPCID ) 20 MG tablet Take 1 tablet (20 mg total) by mouth 2 (two) times daily.    glucose blood (ONETOUCH VERIO) test strip Use as instructed twice daily DX E11.65    hydrALAZINE  (APRESOLINE ) 10 MG tablet Take 1 tablet (10 mg total) by mouth 3 (three) times daily.    HYDROcodone -acetaminophen  (NORCO/VICODIN) 5-325 MG tablet Take 1 tablet by mouth daily as needed for severe pain    isosorbide  mononitrate (IMDUR ) 120 MG 24 hr tablet Take 1 tablet (120 mg total) by mouth 2 (two) times daily.    losartan  (COZAAR ) 100 MG tablet Take 0.5 tablets (50 mg total) by mouth daily.    metoprolol  succinate (TOPROL -XL) 100 MG 24 hr tablet Take 1 tablet (100 mg total) by mouth daily. (Patient taking differently: Take 100 mg by mouth 2 (two) times daily.)    Misc. Devices (ADJUST BATH/SHOWER SEAT) MISC 1 shower seat for use at home to reduce risk of falling.    montelukast  (SINGULAIR ) 10 MG tablet Take 1 tablet (10 mg total) by mouth  at bedtime.    nitroGLYCERIN  (NITROSTAT ) 0.4 MG SL tablet DISSOLVE ONE TABLET UNDER THE TONGUE EVERY 5 MINUTES AS NEEDED FOR CHEST PAIN.  DO NOT EXCEED A TOTAL OF 3 DOSES IN 15 MINUTES (Patient taking differently: Place 0.4 mg under the tongue every 5 (five) minutes as needed for chest pain.) 02/15/2024: prn   OneTouch Delica Lancets 30G MISC USE 1  TO CHECK GLUCOSE TWICE DAILY AS DIRECTED    oxybutynin  (DITROPAN ) 5 MG tablet Take 1 tablet (5 mg total) by mouth daily.    PROAIR  RESPICLICK 108 (90 Base) MCG/ACT AEPB Inhale 2 puffs into the lungs every 6 (six) hours as needed. 02/15/2024: prn   ranolazine  (RANEXA ) 1000 MG SR tablet Take 1 tablet (1,000 mg  total) by mouth 2 (two) times daily.    zolpidem  (AMBIEN ) 5 MG tablet Take 1 tablet (5 mg total) by mouth at bedtime as needed. for sleep 02/15/2024: prn   [DISCONTINUED] acetaminophen  (TYLENOL ) 325 MG tablet Take 2 tablets (650 mg total) by mouth every 6 (six) hours as needed for mild pain (pain score 1-3) (or Fever >/= 101).    [DISCONTINUED] clopidogrel  (PLAVIX ) 75 MG tablet Take 1 tablet (75 mg total) by mouth daily.    [DISCONTINUED] Evolocumab  (REPATHA  SURECLICK) 140 MG/ML SOAJ Inject 140 mg into the skin every 14 (fourteen) days.    [DISCONTINUED] furosemide  (LASIX ) 40 MG tablet Take 1 tablet (40 mg total) by mouth daily. (Patient taking differently: Take 40 mg by mouth daily. Take 1-2 tablets (40-80mg  total) by mouth one daily)    [DISCONTINUED] oxyCODONE  (OXY IR/ROXICODONE ) 5 MG immediate release tablet Take 1 tablet (5 mg total) by mouth every 8 (eight) hours as needed for moderate pain (pain score 4-6).    [DISCONTINUED] tirzepatide  (MOUNJARO ) 5 MG/0.5ML Pen Inject 5 mg into the skin once a week.    clopidogrel  (PLAVIX ) 75 MG tablet Take 1 tablet (75 mg total) by mouth daily.    Evolocumab  (REPATHA  SURECLICK) 140 MG/ML SOAJ Inject 140 mg into the skin every 14 (fourteen) days.    furosemide  (LASIX ) 40 MG tablet Take 1 tablet (40 mg total) by mouth 2 (two) times daily.    tirzepatide  (MOUNJARO ) 5 MG/0.5ML Pen Inject 5 mg into the skin once a week.    No facility-administered encounter medications on file as of 05/10/2024.    Surgical History: Past Surgical History:  Procedure Laterality Date   ABDOMINAL HYSTERECTOMY     CARDIAC CATHETERIZATION     CARDIAC CATHETERIZATION N/A 10/18/2015   Procedure: Left Heart Cath and Cors/Grafts Angiography;  Surgeon: Dorn JINNY Lesches, MD;  Location: [facility] INVASIVE CV LAB;  Service: Cardiovascular;  Laterality: N/A;   CARDIAC CATHETERIZATION N/A 10/18/2015   Procedure: Coronary Stent Intervention;  Surgeon: Dorn JINNY Lesches, MD;  Location: [facility] INVASIVE CV  LAB;  Service: Cardiovascular;  Laterality: N/A;   CARDIAC SURGERY     CHOLECYSTECTOMY     COLONOSCOPY WITH PROPOFOL  N/A 12/02/2021   Procedure: COLONOSCOPY WITH PROPOFOL ;  Surgeon: Therisa Bi, MD;  Location: [facility] ENDOSCOPY;  Service: Gastroenterology;  Laterality: N/A;   CORONARY ANGIOPLASTY     CORONARY ARTERY BYPASS GRAFT     4 vessels - 2010   CORONARY STENT INTERVENTION N/A 02/16/2017   Procedure: CORONARY STENT INTERVENTION;  Surgeon: Florencio Cara BIRCH, MD;  Location: [facility] INVASIVE CV LAB;  Service: Cardiovascular;  Laterality: N/A;   CORONARY STENT INTERVENTION N/A 02/13/2019   Procedure: CORONARY STENT INTERVENTION;  Surgeon: Mady Bruckner, MD;  Location: [facility]  INVASIVE CV LAB;  Service: Cardiovascular;  Laterality: N/A;  SVG to RCA   CORONARY STENT INTERVENTION Left 03/08/2019   Procedure: CORONARY STENT INTERVENTION;  Surgeon: Florencio Cara BIRCH, MD;  Location: [facility] INVASIVE CV LAB;  Service: Cardiovascular;  Laterality: Left;   CORONARY STENT INTERVENTION N/A 11/04/2020   Procedure: CORONARY STENT INTERVENTION;  Surgeon: Mady Bruckner, MD;  Location: [facility] INVASIVE CV LAB;  Service: Cardiovascular;  Laterality: N/A;   CORONARY STENT INTERVENTION N/A 12/24/2021   Procedure: CORONARY STENT INTERVENTION;  Surgeon: Lawyer Bernardino Cough, MD;  Location: [facility] INVASIVE CV LAB;  Service: Cardiovascular;  Laterality: N/A;   ESOPHAGOGASTRODUODENOSCOPY N/A 12/02/2021   Procedure: ESOPHAGOGASTRODUODENOSCOPY (EGD);  Surgeon: Therisa Bi, MD;  Location: [facility] ENDOSCOPY;  Service: Gastroenterology;  Laterality: N/A;   LEFT HEART CATH AND CORONARY ANGIOGRAPHY Left 02/16/2017   Procedure: LEFT HEART CATH AND CORONARY ANGIOGRAPHY;  Surgeon: Hester Wolm PARAS, MD;  Location: [facility] INVASIVE CV LAB;  Service: Cardiovascular;  Laterality: Left;   LEFT HEART CATH AND CORONARY ANGIOGRAPHY Left 12/24/2021   Procedure: LEFT HEART CATH AND CORONARY ANGIOGRAPHY;  Surgeon: Hester Wolm PARAS, MD;  Location: [facility] INVASIVE  CV LAB;  Service: Cardiovascular;  Laterality: Left;   LEFT HEART CATH AND CORONARY ANGIOGRAPHY Left 06/17/2022   Procedure: LEFT HEART CATH AND CORONARY ANGIOGRAPHY;  Surgeon: Florencio Cara BIRCH, MD;  Location: [facility] INVASIVE CV LAB;  Service: Cardiovascular;  Laterality: Left;   LEFT HEART CATH AND CORS/GRAFTS ANGIOGRAPHY Left 11/23/2017   Procedure: LEFT HEART CATH AND CORS/GRAFTS ANGIOGRAPHY;  Surgeon: Hester Wolm PARAS, MD;  Location: [facility] INVASIVE CV LAB;  Service: Cardiovascular;  Laterality: Left;   LEFT HEART CATH AND CORS/GRAFTS ANGIOGRAPHY N/A 02/13/2019   Procedure: LEFT HEART CATH AND CORS/GRAFTS ANGIOGRAPHY;  Surgeon: Hester Wolm PARAS, MD;  Location: [facility] INVASIVE CV LAB;  Service: Cardiovascular;  Laterality: N/A;   LEFT HEART CATH AND CORS/GRAFTS ANGIOGRAPHY N/A 07/03/2019   Procedure: LEFT HEART CATH AND CORS/GRAFTS ANGIOGRAPHY;  Surgeon: Hester Wolm PARAS, MD;  Location: [facility] INVASIVE CV LAB;  Service: Cardiovascular;  Laterality: N/A;   LEFT HEART CATH AND CORS/GRAFTS ANGIOGRAPHY N/A 11/04/2020   Procedure: LEFT HEART CATH AND CORS/GRAFTS ANGIOGRAPHY;  Surgeon: Hester Wolm PARAS, MD;  Location: [facility] INVASIVE CV LAB;  Service: Cardiovascular;  Laterality: N/A;   LEFT HEART CATH AND CORS/GRAFTS ANGIOGRAPHY N/A 02/14/2024   Procedure: LEFT HEART CATH AND CORS/GRAFTS ANGIOGRAPHY;  Surgeon: Ammon Blunt, MD;  Location: [facility] INVASIVE CV LAB;  Service: Cardiovascular;  Laterality: N/A;    Medical History: Past Medical History:  Diagnosis Date   Asthma    Coronary artery disease    Diabetes mellitus without complication (HCC)    Heart attack (HCC)    Hyperlipidemia    Hypertension    MI (myocardial infarction) (HCC)    Migraine headache with aura    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    Family History: Family History  Problem Relation Age of Onset   Diabetes Mother    Diabetes Father    Cancer Father    Diabetes Brother     Social History   Socioeconomic History    Marital status: Widowed    Spouse name: lynwood   Number of children: Not on file   Years of education: Not on file   Highest education level: Not on file  Occupational History   Not on file  Tobacco Use   Smoking status: Never   Smokeless tobacco: Never  Vaping Use   Vaping status: Never Used  Substance and Sexual Activity   Alcohol use: No    Alcohol/week: 0.0 standard drinks of alcohol   Drug use: No   Sexual activity: Yes  Other Topics Concern   Not on file  Social History Narrative   Not on file   Social Drivers of Health   Financial Resource Strain: Low Risk  (01/27/2024)   Received from [facility]   Overall Financial Resource Strain (CARDIA)    Difficulty of Paying Living Expenses: Not very hard  Food Insecurity: No Food Insecurity (02/23/2024)   Hunger Vital Sign    Worried About Running Out of Food in the Last Year: Never true    Ran Out of Food in the Last Year: Never true  Recent Concern: Food Insecurity - Food Insecurity Present (01/27/2024)   Received from [facility]   Hunger Vital Sign    Within the past 12 months, you worried that your food would run out before you got the money to buy more.: Sometimes true    Within the past 12 months, the food you bought just didn't last and you didn't have money to get more.: Sometimes true  Transportation Needs: No Transportation Needs (02/23/2024)   PRAPARE - Administrator, Civil Service (Medical): No    Lack of Transportation (Non-Medical): No  Physical Activity: Not on file  Stress: Not on file  Social Connections: Socially Integrated (02/23/2024)   Social Connection and Isolation Panel    Frequency of Communication with Friends and Family: More than three times a week    Frequency of Social Gatherings with Friends and Family: Never    Attends Religious Services: More than 4 times per year    Active Member of [employer] or Organizations: Yes    Attends Banker  Meetings: Never    Marital Status: Married  Catering Manager Violence: Not At Risk (02/23/2024)   Humiliation, Afraid, Rape, and Kick questionnaire    Fear of Current or Ex-Partner: No    Emotionally Abused: No    Physically Abused: No    Sexually Abused: No      Review of Systems  Constitutional:  Positive for fatigue. Negative for chills and unexpected weight change.  HENT:  Negative for congestion, postnasal drip, rhinorrhea, sneezing and sore throat.   Eyes:  Negative for redness.  Respiratory:  Negative for chest tightness.   Cardiovascular:  Positive for chest pain. Negative for palpitations.  Gastrointestinal:  Negative for abdominal pain, constipation, diarrhea, nausea and vomiting.  Genitourinary:  Negative for frequency.  Musculoskeletal:  Positive for arthralgias. Negative for back pain, joint swelling and neck pain.  Skin:  Negative for wound.  Neurological: Negative.  Negative for tremors and numbness.  Hematological:  Negative for adenopathy. Does not bruise/bleed easily.  Psychiatric/Behavioral:  Positive for behavioral problems (Depression) and sleep disturbance. Negative for suicidal ideas. The patient is not nervous/anxious.     Vital Signs: BP 130/80   Pulse 100   Temp 98.3 F (36.8 C)   Resp 16   Ht 5' 7 (1.702 m)   Wt 245 lb (111.1 kg)   SpO2 97%   BMI 38.37 kg/m    Physical Exam Vitals and nursing note reviewed.  Constitutional:      Appearance: Normal appearance. She is obese.  HENT:     Head: Normocephalic and atraumatic.  Eyes:     Extraocular Movements: Extraocular movements intact.  Cardiovascular:     Rate and Rhythm: Normal rate  and regular rhythm.     Pulses: Normal pulses.     Heart sounds: Normal heart sounds.  Pulmonary:     Effort: Pulmonary effort is normal.     Breath sounds: Normal breath sounds.  Skin:    General: Skin is warm and dry.  Neurological:     General: No focal deficit present.     Mental Status: She is alert.   Psychiatric:        Thought Content: Thought content normal.        Judgment: Judgment normal.        Assessment/Plan: 1. Type 2 diabetes mellitus with other circulatory complication, without long-term current use of insulin  (HCC) (Primary) - POCT HgB A1C is 7.8 which is up from 7.6 last visit due to being without her mounjaro . Will resend higher 5mg  dose and pt given 2.5 mg sample to complete prior to this. Pt will check with pharmacy to ensure they are filling script. Continue Jardiance  - tirzepatide  (MOUNJARO ) 5 MG/0.5ML Pen; Inject 5 mg into the skin once a week.  Dispense: 2 mL; Refill: 2  2. Coronary artery disease involving native coronary artery of native heart with angina pectoris Followed by cardiology and reports they are sending her to another specialist and pain provider due to continual CP despite nitroglycerin . Given short term pain meds, but will need pain management for further meds and pt encouraged to follow up with cardiology regarding this referral status  3. Essential hypertension Stable, continue current medications - furosemide  (LASIX ) 40 MG tablet; Take 1 tablet (40 mg total) by mouth 2 (two) times daily.  Dispense: 180 tablet; Refill: 1  4. Moderate episode of recurrent major depressive disorder (HCC) Continue Lexapro  and will refer for counseling - Ambulatory referral to Psychology  5. Chronic asthma, mild intermittent, uncomplicated PFT stable, will schedule with pulmonology   General Counseling: Cherril verbalizes understanding of the findings of todays visit and agrees with plan of treatment. I have discussed any further diagnostic evaluation that may be needed or ordered today. We also reviewed her medications today. she has been encouraged to call the office with any questions or concerns that should arise related to todays visit.    Orders Placed This Encounter  Procedures   Ambulatory referral to Psychology   POCT HgB A1C    Meds ordered this  encounter  Medications   Evolocumab  (REPATHA  SURECLICK) 140 MG/ML SOAJ    Sig: Inject 140 mg into the skin every 14 (fourteen) days.    Dispense:  2 mL    Refill:  2    Free 30-day coupon: RxBin [id number], RxGrp: [id number], RxPCN: [id number], ID: [id number]   clopidogrel  (PLAVIX ) 75 MG tablet    Sig: Take 1 tablet (75 mg total) by mouth daily.    Dispense:  90 tablet    Refill:  1   furosemide  (LASIX ) 40 MG tablet    Sig: Take 1 tablet (40 mg total) by mouth 2 (two) times daily.    Dispense:  180 tablet    Refill:  1   tirzepatide  (MOUNJARO ) 5 MG/0.5ML Pen    Sig: Inject 5 mg into the skin once a week.    Dispense:  2 mL    Refill:  2   HYDROcodone -acetaminophen  (NORCO/VICODIN) 5-325 MG tablet    Sig: Take 1 tablet by mouth daily as needed for severe pain    Dispense:  30 tablet    Refill:  0    This patient was seen by  Tinnie Pro, PA-C in collaboration with Dr. Sigrid Bathe as a part of collaborative care agreement.   Total time spent:35 Minutes Time spent includes review of chart, medications, test results, and follow up plan with the patient.      Dr Fozia M Khan Internal medicine

## 2024-05-11 ENCOUNTER — Encounter

## 2024-05-14 ENCOUNTER — Encounter

## 2024-05-15 ENCOUNTER — Telehealth: Payer: Self-pay | Admitting: Physician Assistant

## 2024-05-15 ENCOUNTER — Encounter: Attending: Internal Medicine

## 2024-05-15 DIAGNOSIS — I214 Non-ST elevation (NSTEMI) myocardial infarction: Secondary | ICD-10-CM | POA: Diagnosis present

## 2024-05-15 DIAGNOSIS — Z955 Presence of coronary angioplasty implant and graft: Secondary | ICD-10-CM | POA: Diagnosis present

## 2024-05-15 DIAGNOSIS — Z48812 Encounter for surgical aftercare following surgery on the circulatory system: Secondary | ICD-10-CM | POA: Diagnosis present

## 2024-05-15 LAB — GLUCOSE, CAPILLARY
Glucose-Capillary: 171 mg/dL — ABNORMAL HIGH (ref 70–99)
Glucose-Capillary: 148 mg/dL — ABNORMAL HIGH (ref 70–99)

## 2024-05-15 NOTE — Progress Notes (Signed)
 Daily Session Note  Patient Details  Name: Felicia Woods MRN: [id number] Date of Birth: Nov 22, 1955 Referring Provider:   Flowsheet Row Cardiac Rehab from 05/02/2024 in [facility] Cardiac and Pulmonary Rehab  Referring Provider Florencio Kava, MD    Encounter Date: 05/15/2024  Check In:  Session Check In - 05/15/24 0728       Check-In   Staff Present Selinda Pereyra RDN,LDN;Noah Tickle, BS, Exercise Physiologist;Margaret Best, MS, Exercise Physiologist;Mary Godley, RN, DNP, NE-BC;Malasia Torain RN,BSN,MPA    Virtual Visit No    Medication changes reported     Yes    Comments added hydrocodone  5/acetamenophen 325 prn    Fall or balance concerns reported    No    Tobacco Cessation No Change    Warm-up and Cool-down Performed on first and last piece of equipment    Resistance Training Performed Yes    VAD Patient? No    PAD/SET Patient? No      Pain Assessment   Currently in Pain? No/denies             Social History   Tobacco Use  Smoking Status Never  Smokeless Tobacco Never    Goals Met:  Independence with exercise equipment Exercise tolerated well No report of concerns or symptoms today Strength training completed today  Goals Unmet:  Not Applicable  Comments: First full day of exercise!  Patient was oriented to gym and equipment including functions, settings, policies, and procedures.  Patient's individual exercise prescription and treatment plan were reviewed.  All starting workloads were established based on the results of the 6 minute walk test done at initial orientation visit.  The plan for exercise progression was also introduced and progression will be customized based on patient's performance and goals.    Dr. Oneil Pinal is Medical Director for [facility] Cardiac Rehabilitation.  Dr. Fuad Aleskerov is Medical Director for [facility] Pulmonary Rehabilitation.

## 2024-05-15 NOTE — Telephone Encounter (Signed)
 Psychology referral faxed to [facility]; [phone] Notified patient. Gave pt telephone [phone]

## 2024-05-16 ENCOUNTER — Encounter

## 2024-05-17 ENCOUNTER — Encounter: Admitting: Emergency Medicine

## 2024-05-17 DIAGNOSIS — Z48812 Encounter for surgical aftercare following surgery on the circulatory system: Secondary | ICD-10-CM | POA: Diagnosis not present

## 2024-05-17 DIAGNOSIS — I214 Non-ST elevation (NSTEMI) myocardial infarction: Secondary | ICD-10-CM

## 2024-05-17 LAB — GLUCOSE, CAPILLARY
Glucose-Capillary: 284 mg/dL — ABNORMAL HIGH (ref 70–99)
Glucose-Capillary: 200 mg/dL — ABNORMAL HIGH (ref 70–99)

## 2024-05-17 NOTE — Progress Notes (Signed)
 Daily Session Note  Patient Details  Name: Felicia Woods MRN: [id number] Date of Birth: 05-Nov-1955 Referring Provider:   Flowsheet Row Cardiac Rehab from 05/02/2024 in [facility] Cardiac and Pulmonary Rehab  Referring Provider Florencio Kava, MD    Encounter Date: 05/17/2024  Check In:  Session Check In - 05/17/24 0731       Check-In   Supervising physician immediately available to respond to emergencies See telemetry face sheet for immediately available ER MD    Location [facility]-Cardiac & Pulmonary Rehab    Staff Present Leita Franks RN,BSN;Joseph [facility] [place], [place], Exercise Physiologist    Virtual Visit No    Medication changes reported     No    Fall or balance concerns reported    No    Tobacco Cessation No Change    Warm-up and Cool-down Performed on first and last piece of equipment    Resistance Training Performed Yes    VAD Patient? No    PAD/SET Patient? No      Pain Assessment   Currently in Pain? No/denies             Social History   Tobacco Use  Smoking Status Never  Smokeless Tobacco Never    Goals Met:  Independence with exercise equipment Exercise tolerated well No report of concerns or symptoms today Strength training completed today  Goals Unmet:  Not Applicable  Comments: Pt able to follow exercise prescription today without complaint.  Will continue to monitor for progression.    Dr. Oneil Pinal is Medical Director for [facility] Cardiac Rehabilitation.  Dr. Fuad Aleskerov is Medical Director for [facility] Pulmonary Rehabilitation.

## 2024-05-18 ENCOUNTER — Encounter

## 2024-05-21 ENCOUNTER — Encounter

## 2024-05-22 ENCOUNTER — Encounter

## 2024-05-22 DIAGNOSIS — I214 Non-ST elevation (NSTEMI) myocardial infarction: Secondary | ICD-10-CM

## 2024-05-22 DIAGNOSIS — Z955 Presence of coronary angioplasty implant and graft: Secondary | ICD-10-CM

## 2024-05-22 DIAGNOSIS — Z48812 Encounter for surgical aftercare following surgery on the circulatory system: Secondary | ICD-10-CM | POA: Diagnosis not present

## 2024-05-22 NOTE — Progress Notes (Signed)
 Daily Session Note  Patient Details  Name: Swayzee Wadley MRN: [id number] Date of Birth: [birth year] Referring Provider:   Flowsheet Row Cardiac Rehab from 05/02/2024 in [facility] Cardiac and Pulmonary Rehab  Referring Provider Florencio Kava, MD    Encounter Date: 05/22/2024  Check In:  Session Check In - 05/22/24 0730       Check-In   Supervising physician immediately available to respond to emergencies See telemetry face sheet for immediately available ER MD    Location [facility]-Cardiac & Pulmonary Rehab    Staff Present Selinda Pereyra RDN,LDN;Margaret Best, MS, Exercise Physiologist;Huy Majid RN,BSN,MPA;Mary Godley, RN, DNP, NE-BC    Virtual Visit No    Medication changes reported     No    Fall or balance concerns reported    No    Tobacco Cessation No Change    Warm-up and Cool-down Performed on first and last piece of equipment    Resistance Training Performed Yes    VAD Patient? No    PAD/SET Patient? No      Pain Assessment   Currently in Pain? No/denies             Social History   Tobacco Use  Smoking Status Never  Smokeless Tobacco Never    Goals Met:  Independence with exercise equipment Exercise tolerated well No report of concerns or symptoms today Strength training completed today  Goals Unmet:  Not Applicable  Comments: Pt able to follow exercise prescription today without complaint.  Will continue to monitor for progression.    Dr. Oneil Pinal is Medical Director for [facility] Cardiac Rehabilitation.  Dr. Fuad Aleskerov is Medical Director for [facility] Pulmonary Rehabilitation.

## 2024-05-23 ENCOUNTER — Encounter

## 2024-05-23 DIAGNOSIS — Z955 Presence of coronary angioplasty implant and graft: Secondary | ICD-10-CM

## 2024-05-23 DIAGNOSIS — I214 Non-ST elevation (NSTEMI) myocardial infarction: Secondary | ICD-10-CM

## 2024-05-23 NOTE — Progress Notes (Signed)
 Cardiac Individual Treatment Plan  Patient Details  Name: Felicia Woods MRN: [id number] Date of Birth: July 12, 1955 Referring Provider:   Flowsheet Row Cardiac Rehab from 05/02/2024 in [facility] Cardiac and Pulmonary Rehab  Referring Provider Florencio Kava, MD    Initial Encounter Date:  Flowsheet Row Cardiac Rehab from 05/02/2024 in [facility] Cardiac and Pulmonary Rehab  Date 05/02/24    Visit Diagnosis: NSTEMI (non-ST elevated myocardial infarction) [facility])  S/P coronary artery stent placement  Patient's Home Medications on Admission:  Current Outpatient Medications:    amLODipine  (NORVASC ) 5 MG tablet, Take 1 tablet (5 mg total) by mouth daily., Disp: 90 tablet, Rfl: 1   aspirin  EC 81 MG tablet, Take 81 mg by mouth daily., Disp: , Rfl:    atorvastatin  (LIPITOR ) 40 MG tablet, Take 2 tablets (80 mg total) by mouth daily., Disp: 90 tablet, Rfl: 3   Blood Glucose Monitoring Suppl (ONETOUCH VERIO FLEX SYSTEM) w/Device KIT, Use as directed twice a daily DX E11.65, Disp: 1 kit, Rfl: 0   Budeson-Glycopyrrol-Formoterol  (BREZTRI  AEROSPHERE) 160-9-4.8 MCG/ACT AERO, Inhale 2 puffs into the lungs 2 (two) times daily., Disp: 10.7 g, Rfl: 11   clopidogrel  (PLAVIX ) 75 MG tablet, Take 1 tablet (75 mg total) by mouth daily., Disp: 90 tablet, Rfl: 1   colchicine  0.6 MG tablet, Take 1 tablet (0.6 mg total) by mouth 2 (two) times daily for 14 days., Disp: 28 tablet, Rfl: 0   empagliflozin  (JARDIANCE ) 25 MG TABS tablet, Take one tab a day for diabetes, Disp: 90 tablet, Rfl: 3   escitalopram  (LEXAPRO ) 10 MG tablet, Take 1 tablet (10 mg total) by mouth daily., Disp: 30 tablet, Rfl: 2   Evolocumab  (REPATHA  SURECLICK) 140 MG/ML SOAJ, Inject 140 mg into the skin every 14 (fourteen) days., Disp: 2 mL, Rfl: 2   ezetimibe  (ZETIA ) 10 MG tablet, Take 1 tablet (10 mg total) by mouth daily., Disp: 90 tablet, Rfl: 1   famotidine  (PEPCID ) 20 MG tablet, Take 1 tablet (20 mg total) by mouth 2 (two) times daily., Disp: 90  tablet, Rfl: 1   furosemide  (LASIX ) 40 MG tablet, Take 1 tablet (40 mg total) by mouth 2 (two) times daily., Disp: 180 tablet, Rfl: 1   glucose blood (ONETOUCH VERIO) test strip, Use as instructed twice daily DX E11.65, Disp: 100 each, Rfl: 3   hydrALAZINE  (APRESOLINE ) 10 MG tablet, Take 1 tablet (10 mg total) by mouth 3 (three) times daily., Disp: 90 tablet, Rfl: 2   HYDROcodone -acetaminophen  (NORCO/VICODIN) 5-325 MG tablet, Take 1 tablet by mouth daily as needed for severe pain, Disp: 30 tablet, Rfl: 0   isosorbide  mononitrate (IMDUR ) 120 MG 24 hr tablet, Take 1 tablet (120 mg total) by mouth 2 (two) times daily., Disp: 60 tablet, Rfl: 1   losartan  (COZAAR ) 100 MG tablet, Take 0.5 tablets (50 mg total) by mouth daily., Disp: 90 tablet, Rfl: 3   metoprolol  succinate (TOPROL -XL) 100 MG 24 hr tablet, Take 1 tablet (100 mg total) by mouth daily. (Patient taking differently: Take 100 mg by mouth 2 (two) times daily.), Disp: 30 tablet, Rfl: 1   Misc. Devices (ADJUST BATH/SHOWER SEAT) MISC, 1 shower seat for use at home to reduce risk of falling., Disp: 1 each, Rfl: 0   montelukast  (SINGULAIR ) 10 MG tablet, Take 1 tablet (10 mg total) by mouth at bedtime., Disp: 90 tablet, Rfl: 1   nitroGLYCERIN  (NITROSTAT ) 0.4 MG SL tablet, DISSOLVE ONE TABLET UNDER THE TONGUE EVERY 5 MINUTES AS NEEDED FOR CHEST PAIN.  DO NOT  EXCEED A TOTAL OF 3 DOSES IN 15 MINUTES (Patient taking differently: Place 0.4 mg under the tongue every 5 (five) minutes as needed for chest pain.), Disp: 30 tablet, Rfl: 3   OneTouch Delica Lancets 30G MISC, USE 1  TO CHECK GLUCOSE TWICE DAILY AS DIRECTED, Disp: 100 each, Rfl: 0   oxybutynin  (DITROPAN ) 5 MG tablet, Take 1 tablet (5 mg total) by mouth daily., Disp: 90 tablet, Rfl: 1   PROAIR  RESPICLICK 108 (90 Base) MCG/ACT AEPB, Inhale 2 puffs into the lungs every 6 (six) hours as needed., Disp: 3 each, Rfl: 1   ranolazine  (RANEXA ) 1000 MG SR tablet, Take 1 tablet (1,000 mg total) by mouth 2 (two)  times daily., Disp: 60 tablet, Rfl: 2   tirzepatide  (MOUNJARO ) 5 MG/0.5ML Pen, Inject 5 mg into the skin once a week., Disp: 2 mL, Rfl: 2   zolpidem  (AMBIEN ) 5 MG tablet, Take 1 tablet (5 mg total) by mouth at bedtime as needed. for sleep, Disp: 30 tablet, Rfl: 2  Past Medical History: Past Medical History:  Diagnosis Date   Asthma    Coronary artery disease    Diabetes mellitus without complication (HCC)    Heart attack (HCC)    Hyperlipidemia    Hypertension    MI (myocardial infarction) (HCC)    Migraine headache with aura    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    Tobacco Use: Social History   Tobacco Use  Smoking Status Never  Smokeless Tobacco Never    Labs: Review Flowsheet  More data exists      Latest Ref Rng & Units 12/17/2022 06/01/2023 12/19/2023 02/10/2024 05/10/2024  Labs for ITP Cardiac and Pulmonary Rehab  Cholestrol 0 - 200 mg/dL - - - 796  -  LDL (calc) 0 - 99 mg/dL - - - 852  -  HDL-C >59 mg/dL - - - 42  -  Trlycerides <150 mg/dL - - - 70  -  Hemoglobin A1c 4.0 - 5.6 % 7.5  7.6  7.6  - 7.8      Exercise Target Goals: Exercise Program Goal: Individual exercise prescription set using results from initial 6 min walk test and THRR while considering  patient's activity barriers and safety.   Exercise Prescription Goal: Initial exercise prescription builds to 30-45 minutes a day of aerobic activity, 2-3 days per week.  Home exercise guidelines will be given to patient during program as part of exercise prescription that the participant will acknowledge.   Education: Aerobic Exercise: - Group verbal and visual presentation on the components of exercise prescription. Introduces F.I.T.T principle from ACSM for exercise prescriptions.  Reviews F.I.T.T. principles of aerobic exercise including progression. Written material provided at class time. Flowsheet Row Cardiac Rehab from 06/10/2022 in [facility] Cardiac and Pulmonary Rehab  Education need identified 02/08/22     Education: Resistance Exercise: - Group verbal and visual presentation on the components of exercise prescription. Introduces F.I.T.T principle from ACSM for exercise prescriptions  Reviews F.I.T.T. principles of resistance exercise including progression. Written material provided at class time.    Education: Exercise & Equipment Safety: - Individual verbal instruction and demonstration of equipment use and safety with use of the equipment. Flowsheet Row Cardiac Rehab from 05/02/2024 in [facility] Cardiac and Pulmonary Rehab  Date 05/02/24  Educator MB  Instruction Review Code 1- Verbalizes Understanding    Education: Exercise Physiology & General Exercise Guidelines: - Group verbal and written instruction with models to review the exercise physiology of the cardiovascular  system and associated critical values. Provides general exercise guidelines with specific guidelines to those with heart or lung disease. Written material provided at class time.   Education: Flexibility, Balance, Mind/Body Relaxation: - Group verbal and visual presentation with interactive activity on the components of exercise prescription. Introduces F.I.T.T principle from ACSM for exercise prescriptions. Reviews F.I.T.T. principles of flexibility and balance exercise training including progression. Also discusses the mind body connection.  Reviews various relaxation techniques to help reduce and manage stress (i.e. Deep breathing, progressive muscle relaxation, and visualization). Balance handout provided to take home. Written material provided at class time.   Activity Barriers & Risk Stratification:  Activity Barriers & Cardiac Risk Stratification - 05/02/24 1544       Activity Barriers & Cardiac Risk Stratification   Activity Barriers Joint Problems;Chest Pain/Angina   right knee pain   Cardiac Risk Stratification High          6 Minute Walk:  6 Minute Walk     Row Name 05/02/24 1540         6 Minute  Walk   Phase Initial     Distance 370 feet     Walk Time 3 minutes     # of Rest Breaks 1  2nd half of test     MPH 1.4     METS 1.16     RPE 13     Perceived Dyspnea  2     VO2 Peak 4.07     Symptoms Yes (comment)     Comments Chest pain 10/10 during walk at the 3 minute mark. Took 1 Nitroglycerin  and relieved with rest and did not continue walking     Resting HR 86 bpm     Resting BP 126/70     Resting Oxygen  Saturation  99 %     Exercise Oxygen  Saturation  during 6 min walk 94 %     Max Ex. HR 142 bpm     Max Ex. BP 130/84     2 Minute Post BP 110/82        Oxygen  Initial Assessment:   Oxygen  Re-Evaluation:   Oxygen  Discharge (Final Oxygen  Re-Evaluation):   Initial Exercise Prescription:  Initial Exercise Prescription - 05/02/24 1500       Date of Initial Exercise RX and Referring Provider   Date 05/02/24    Referring Provider Florencio Kava, MD      Oxygen    Maintain Oxygen  Saturation 88% or higher      Recumbant Bike   Level 1    RPM 50    Watts 15    Minutes 15    METs 1.16      NuStep   Level 1    SPM 80    Minutes 15    METs 1.16      T5 Nustep   Level 1    SPM 80    Minutes 15    METs 1.16      Biostep-RELP   Level 1    SPM 50    Minutes 15    METs 1.16      Prescription Details   Frequency (times per week) 3    Duration Progress to 30 minutes of continuous aerobic without signs/symptoms of physical distress      Intensity   THRR 40-80% of Max Heartrate 112-139    Ratings of Perceived Exertion 11-13    Perceived Dyspnea 0-4      Progression   Progression Continue to  progress workloads to maintain intensity without signs/symptoms of physical distress.      Resistance Training   Training Prescription Yes    Weight 4 lb    Reps 10-15          Perform Capillary Blood Glucose checks as needed.  Exercise Prescription Changes:   Exercise Prescription Changes     Row Name 05/02/24 1500             Response to  Exercise   Blood Pressure (Admit) 126/70       Blood Pressure (Exercise) 130/84       Blood Pressure (Exit) 110/82       Heart Rate (Admit) 86 bpm       Heart Rate (Exercise) 142 bpm       Heart Rate (Exit) 97 bpm       Oxygen  Saturation (Admit) 99 %       Oxygen  Saturation (Exercise) 94 %       Oxygen  Saturation (Exit) 96 %       Rating of Perceived Exertion (Exercise) 13       Perceived Dyspnea (Exercise) 2       Symptoms Chest pain 10/10, took 1 nitroglycerin  at the 3 minute mark, relieved with rest, did not continue walking       Comments results         Progression   Average METs 1.16          Exercise Comments:   Exercise Goals and Review:   Exercise Goals     Row Name 05/02/24 1548             Exercise Goals   Increase Physical Activity Yes       Intervention Provide advice, education, support and counseling about physical activity/exercise needs.;Develop an individualized exercise prescription for aerobic and resistive training based on initial evaluation findings, risk stratification, comorbidities and participant's personal goals.       Expected Outcomes Short Term: Attend rehab on a regular basis to increase amount of physical activity.;Long Term: Add in home exercise to make exercise part of routine and to increase amount of physical activity.;Long Term: Exercising regularly at least 3-5 days a week.       Increase Strength and Stamina Yes       Intervention Provide advice, education, support and counseling about physical activity/exercise needs.;Develop an individualized exercise prescription for aerobic and resistive training based on initial evaluation findings, risk stratification, comorbidities and participant's personal goals.       Expected Outcomes Short Term: Increase workloads from initial exercise prescription for resistance, speed, and METs.;Short Term: Perform resistance training exercises routinely during rehab and add in resistance training at  home;Long Term: Improve cardiorespiratory fitness, muscular endurance and strength as measured by increased METs and functional capacity ( )       Able to understand and use rate of perceived exertion (RPE) scale Yes       Intervention Provide education and explanation on how to use RPE scale       Expected Outcomes Short Term: Able to use RPE daily in rehab to express subjective intensity level;Long Term:  Able to use RPE to guide intensity level when exercising independently       Able to understand and use Dyspnea scale Yes       Intervention Provide education and explanation on how to use Dyspnea scale       Expected Outcomes Short Term: Able to use Dyspnea scale daily  in rehab to express subjective sense of shortness of breath during exertion;Long Term: Able to use Dyspnea scale to guide intensity level when exercising independently       Knowledge and understanding of Target Heart Rate Range (THRR) Yes       Intervention Provide education and explanation of THRR including how the numbers were predicted and where they are located for reference       Expected Outcomes Short Term: Able to state/look up THRR;Short Term: Able to use daily as guideline for intensity in rehab;Long Term: Able to use THRR to govern intensity when exercising independently       Able to check pulse independently Yes       Intervention Provide education and demonstration on how to check pulse in carotid and radial arteries.;Review the importance of being able to check your own pulse for safety during independent exercise       Expected Outcomes Short Term: Able to explain why pulse checking is important during independent exercise;Long Term: Able to check pulse independently and accurately       Understanding of Exercise Prescription Yes       Intervention Provide education, explanation, and written materials on patient's individual exercise prescription       Expected Outcomes Short Term: Able to explain program  exercise prescription;Long Term: Able to explain home exercise prescription to exercise independently          Exercise Goals Re-Evaluation :  Exercise Goals Re-Evaluation     Row Name 05/15/24 0732             Exercise Goal Re-Evaluation   Exercise Goals Review Increase Physical Activity;Able to understand and use rate of perceived exertion (RPE) scale;Knowledge and understanding of Target Heart Rate Range (THRR);Understanding of Exercise Prescription;Increase Strength and Stamina;Able to check pulse independently;Able to understand and use Dyspnea scale       Comments Reviewed RPE and dyspnea scale, THR and program prescription with pt today.  Pt voiced understanding and was given a copy of goals to take home.       Expected Outcomes Short: Use RPE daily to regulate intensity. Long: Follow program prescription in THR.          Discharge Exercise Prescription (Final Exercise Prescription Changes):  Exercise Prescription Changes - 05/02/24 1500       Response to Exercise   Blood Pressure (Admit) 126/70    Blood Pressure (Exercise) 130/84    Blood Pressure (Exit) 110/82    Heart Rate (Admit) 86 bpm    Heart Rate (Exercise) 142 bpm    Heart Rate (Exit) 97 bpm    Oxygen  Saturation (Admit) 99 %    Oxygen  Saturation (Exercise) 94 %    Oxygen  Saturation (Exit) 96 %    Rating of Perceived Exertion (Exercise) 13    Perceived Dyspnea (Exercise) 2    Symptoms Chest pain 10/10, took 1 nitroglycerin  at the 3 minute mark, relieved with rest, did not continue walking    Comments results      Progression   Average METs 1.16          Nutrition:  Target Goals: Understanding of nutrition guidelines, daily intake of sodium 1500mg , cholesterol 200mg , calories 30% from fat and 7% or less from saturated fats, daily to have 5 or more servings of fruits and vegetables.  Education: Nutrition 1 -Group instruction provided by verbal, written material, interactive activities,  discussions, models, and posters to present general guidelines for heart healthy nutrition  including macronutrients, label reading, and promoting whole foods over processed counterparts. Education serves as pensions consultant of discussion of heart healthy eating for all. Written material provided at class time.    Education: Nutrition 2 -Group instruction provided by verbal, written material, interactive activities, discussions, models, and posters to present general guidelines for heart healthy nutrition including sodium, cholesterol, and saturated fat. Providing guidance of habit forming to improve blood pressure, cholesterol, and body weight. Written material provided at class time.     Biometrics:  Pre Biometrics - 05/02/24 1549       Pre Biometrics   Height 5' 6.14 (1.68 m)    Weight 243 lb 11.2 oz (110.5 kg)    Waist Circumference 48.2 inches    Hip Circumference 55.8 inches    Waist to Hip Ratio 0.86 %    BMI (Calculated) 39.17    Single Leg Stand 23 seconds           Nutrition Therapy Plan and Nutrition Goals:  Nutrition Therapy & Goals - 05/02/24 1549       Personal Nutrition Goals   Nutrition Goal will meet w/ RD on 11/5      Intervention Plan   Intervention Prescribe, educate and counsel regarding individualized specific dietary modifications aiming towards targeted core components such as weight, hypertension, lipid management, diabetes, heart failure and other comorbidities.    Expected Outcomes Short Term Goal: Understand basic principles of dietary content, such as calories, fat, sodium, cholesterol and nutrients.          Nutrition Assessments:  MEDIFICTS Score Key: >=70 Need to make dietary changes  40-70 Heart Healthy Diet <= 40 Therapeutic Level Cholesterol Diet  Flowsheet Row Cardiac Rehab from 05/15/2024 in [facility] Cardiac and Pulmonary Rehab  Picture Your Plate Total Score on Admission 43   Picture Your Plate Scores: <59 Unhealthy dietary pattern  with much room for improvement. 41-50 Dietary pattern unlikely to meet recommendations for good health and room for improvement. 51-60 More healthful dietary pattern, with some room for improvement.  >60 Healthy dietary pattern, although there may be some specific behaviors that could be improved.    Nutrition Goals Re-Evaluation:   Nutrition Goals Discharge (Final Nutrition Goals Re-Evaluation):   Psychosocial: Target Goals: Acknowledge presence or absence of significant depression and/or stress, maximize coping skills, provide positive support system. Participant is able to verbalize types and ability to use techniques and skills needed for reducing stress and depression.   Education: Stress, Anxiety, and Depression - Group verbal and visual presentation to define topics covered.  Reviews how body is impacted by stress, anxiety, and depression.  Also discusses healthy ways to reduce stress and to treat/manage anxiety and depression. Written material provided at class time. Flowsheet Row Cardiac Rehab from 06/10/2022 in [facility] Cardiac and Pulmonary Rehab  Education need identified 02/08/22    Education: Sleep Hygiene -Provides group verbal and written instruction about how sleep can affect your health.  Define sleep hygiene, discuss sleep cycles and impact of sleep habits. Review good sleep hygiene tips.   Initial Review & Psychosocial Screening:  Initial Psych Review & Screening - 04/26/24 1409       Initial Review   Current issues with Current Stress Concerns    Source of Stress Concerns Retirement/disability;Unable to perform yard/household activities    Comments reports stress due to uncontrolled pain and frustration that she is not able to get pain medication that has worked for her in the past      Family  Dynamics   Good Support System? Yes      Barriers   Psychosocial barriers to participate in program There are no identifiable barriers or psychosocial needs.;The patient  should benefit from training in stress management and relaxation.      Screening Interventions   Interventions Encouraged to exercise;To provide support and resources with identified psychosocial needs;Provide feedback about the scores to participant    Expected Outcomes Short Term goal: Utilizing psychosocial counselor, staff and physician to assist with identification of specific Stressors or current issues interfering with healing process. Setting desired goal for each stressor or current issue identified.;Long Term Goal: Stressors or current issues are controlled or eliminated.;Short Term goal: Identification and review with participant of any Quality of Life or Depression concerns found by scoring the questionnaire.;Long Term goal: The participant improves quality of Life and PHQ9 Scores as seen by post scores and/or verbalization of changes          Quality of Life Scores:   Quality of Life - 05/15/24 0735       Quality of Life   Select Quality of Life      Quality of Life Scores   Health/Function Pre 7.9 %    Socioeconomic Pre 23.38 %    Psych/Spiritual Pre 13.71 %    Family Pre 27.6 %    GLOBAL Pre 15.41 %         Scores of 19 and below usually indicate a poorer quality of life in these areas.  A difference of  2-3 points is a clinically meaningful difference.  A difference of 2-3 points in the total score of the Quality of Life Index has been associated with significant improvement in overall quality of life, self-image, physical symptoms, and general health in studies assessing change in quality of life.  PHQ-9: Review Flowsheet  More data exists      05/02/2024 12/19/2023 09/05/2023 12/17/2022 07/22/2022  Depression screen PHQ 2/9  Decreased Interest 2 0 0 0 0  Down, Depressed, Hopeless 2 0 0 0 0  PHQ - 2 Score 4 0 0 0 0  Altered sleeping 3 - - - -  Tired, decreased energy 2 - - - -  Change in appetite 2 - - - -  Feeling bad or failure about yourself  0 - - - -   Trouble concentrating 0 - - - -  Moving slowly or fidgety/restless 1 - - - -  Suicidal thoughts 0 - - - -  PHQ-9 Score 12  - - - -  Difficult doing work/chores Somewhat difficult - - - -    Details       Data saved with a previous flowsheet row definition        Interpretation of Total Score  Total Score Depression Severity:  1-4 = Minimal depression, 5-9 = Mild depression, 10-14 = Moderate depression, 15-19 = Moderately severe depression, 20-27 = Severe depression   Psychosocial Evaluation and Intervention:  Psychosocial Evaluation - 04/26/24 1414       Psychosocial Evaluation & Interventions   Interventions Stress management education;Relaxation education;Encouraged to exercise with the program and follow exercise prescription    Comments Jerrell is returning to cardiac rehab due to NSTEMI in August and continued, unrelieved chest pain. She expresses frustration that she is unable to acheive adequate pain control with Tylenol , which is what the doctors have encouraged her to use. She states that when she has chest pain, it also causes shortness of breath and left arm  numbess and typically takes 15-20 minutes before she is able to return to what she was doing. She states these episodes regularly happen 5-6 times per day. Lakira's current job is watching her grandchildren each week. She does state that she is in need of a right knee replacement, however she has not been able to have one due to her cardiac issues.    Expected Outcomes Short: Attend cardiac rehab for education and exercise. Long: Develop and maintain positive self care habits.    Continue Psychosocial Services  Follow up required by staff          Psychosocial Re-Evaluation:   Psychosocial Discharge (Final Psychosocial Re-Evaluation):   Vocational Rehabilitation: Provide vocational rehab assistance to qualifying candidates.   Vocational Rehab Evaluation & Intervention:  Vocational Rehab - 04/26/24 1408        Initial Vocational Rehab Evaluation & Intervention   Assessment shows need for Vocational Rehabilitation No      Vocational Rehab Re-Evaulation   Comments no request for VR          Education: Education Goals: Education classes will be provided on a variety of topics geared toward better understanding of heart health and risk factor modification. Participant will state understanding/return demonstration of topics presented as noted by education test scores.  Learning Barriers/Preferences:  Learning Barriers/Preferences - 04/26/24 1408       Learning Barriers/Preferences   Learning Barriers Sight    Learning Preferences Individual Instruction;Verbal Instruction          General Cardiac Education Topics:  AED/CPR: - Group verbal and written instruction with the use of models to demonstrate the basic use of the AED with the basic ABC's of resuscitation.   Test and Procedures: - Group verbal and visual presentation and models provide information about basic cardiac anatomy and function. Reviews the testing methods done to diagnose heart disease and the outcomes of the test results. Describes the treatment choices: Medical Management, Angioplasty, or Coronary Bypass Surgery for treating various heart conditions including Myocardial Infarction, Angina, Valve Disease, and Cardiac Arrhythmias. Written material provided at class time. Flowsheet Row Cardiac Rehab from 06/10/2022 in [facility] Cardiac and Pulmonary Rehab  Education need identified 02/08/22  Date 06/10/22  Educator sb  Instruction Review Code 1- Verbalizes Understanding    Medication Safety: - Group verbal and visual instruction to review commonly prescribed medications for heart and lung disease. Reviews the medication, class of the drug, and side effects. Includes the steps to properly store meds and maintain the prescription regimen. Written material provided at class time.   Intimacy: - Group verbal instruction  through game format to discuss how heart and lung disease can affect sexual intimacy. Written material provided at class time.   Know Your Numbers and Heart Failure: - Group verbal and visual instruction to discuss disease risk factors for cardiac and pulmonary disease and treatment options.  Reviews associated critical values for Overweight/Obesity, Hypertension, Cholesterol, and Diabetes.  Discusses basics of heart failure: signs/symptoms and treatments.  Introduces Heart Failure Zone chart for action plan for heart failure. Written material provided at class time.   Infection Prevention: - Provides verbal and written material to individual with discussion of infection control including proper hand washing and proper equipment cleaning during exercise session. Flowsheet Row Cardiac Rehab from 05/02/2024 in [facility] Cardiac and Pulmonary Rehab  Date 05/02/24  Educator MB  Instruction Review Code 1- Verbalizes Understanding    Falls Prevention: - Provides verbal and written material to individual with discussion  of falls prevention and safety. Flowsheet Row Cardiac Rehab from 05/02/2024 in [facility] Cardiac and Pulmonary Rehab  Date 05/02/24  Educator MB  Instruction Review Code 1- Verbalizes Understanding    Other: -Provides group and verbal instruction on various topics (see comments)   Knowledge Questionnaire Score:  Knowledge Questionnaire Score - 05/15/24 0735       Knowledge Questionnaire Score   Pre Score 22-26          Core Components/Risk Factors/Patient Goals at Admission:  Personal Goals and Risk Factors at Admission - 05/02/24 1550       Core Components/Risk Factors/Patient Goals on Admission    Weight Management Yes;Weight Loss    Intervention Weight Management: Develop a combined nutrition and exercise program designed to reach desired caloric intake, while maintaining appropriate intake of nutrient and fiber, sodium and fats, and appropriate energy expenditure  required for the weight goal.;Weight Management: Provide education and appropriate resources to help participant work on and attain dietary goals.;Weight Management/Obesity: Establish reasonable short term and long term weight goals.;Obesity: Provide education and appropriate resources to help participant work on and attain dietary goals.    Admit Weight 243 lb 11.2 oz (110.5 kg)    Goal Weight: Short Term 200 lb (90.7 kg)    Goal Weight: Long Term 180 lb (81.6 kg)    Expected Outcomes Short Term: Continue to assess and modify interventions until short term weight is achieved;Long Term: Adherence to nutrition and physical activity/exercise program aimed toward attainment of established weight goal;Weight Loss: Understanding of general recommendations for a balanced deficit meal plan, which promotes 1-2 lb weight loss per week and includes a negative energy balance of [phone] kcal/d;Understanding recommendations for meals to include 15-35% energy as protein, 25-35% energy from fat, 35-60% energy from carbohydrates, less than 200mg  of dietary cholesterol, 20-35 gm of total fiber daily;Understanding of distribution of calorie intake throughout the day with the consumption of 4-5 meals/snacks    Diabetes Yes    Intervention Provide education about signs/symptoms and action to take for hypo/hyperglycemia.;Provide education about proper nutrition, including hydration, and aerobic/resistive exercise prescription along with prescribed medications to achieve blood glucose in normal ranges: Fasting glucose 65-99 mg/dL    Expected Outcomes Short Term: Participant verbalizes understanding of the signs/symptoms and immediate care of hyper/hypoglycemia, proper foot care and importance of medication, aerobic/resistive exercise and nutrition plan for blood glucose control.;Long Term: Attainment of HbA1C < 7%.    Hypertension Yes    Intervention Provide education on lifestyle modifcations including regular physical  activity/exercise, weight management, moderate sodium restriction and increased consumption of fresh fruit, vegetables, and low fat dairy, alcohol moderation, and smoking cessation.;Monitor prescription use compliance.    Expected Outcomes Short Term: Continued assessment and intervention until BP is < 140/74mm HG in hypertensive participants. < 130/27mm HG in hypertensive participants with diabetes, heart failure or chronic kidney disease.;Long Term: Maintenance of blood pressure at goal levels.    Lipids Yes    Intervention Provide education and support for participant on nutrition & aerobic/resistive exercise along with prescribed medications to achieve LDL 70mg , HDL >40mg .    Expected Outcomes Short Term: Participant states understanding of desired cholesterol values and is compliant with medications prescribed. Participant is following exercise prescription and nutrition guidelines.;Long Term: Cholesterol controlled with medications as prescribed, with individualized exercise RX and with personalized nutrition plan. Value goals: LDL < 70mg , HDL > 40 mg.    Stress Yes    Intervention Offer individual and/or small group education and  counseling on adjustment to heart disease, stress management and health-related lifestyle change. Teach and support self-help strategies.;Refer participants experiencing significant psychosocial distress to appropriate mental health specialists for further evaluation and treatment. When possible, include family members and significant others in education/counseling sessions.    Expected Outcomes Short Term: Participant demonstrates changes in health-related behavior, relaxation and other stress management skills, ability to obtain effective social support, and compliance with psychotropic medications if prescribed.;Long Term: Emotional wellbeing is indicated by absence of clinically significant psychosocial distress or social isolation.          Education:Diabetes -  Individual verbal and written instruction to review signs/symptoms of diabetes, desired ranges of glucose level fasting, after meals and with exercise. Acknowledge that pre and post exercise glucose checks will be done for 3 sessions at entry of program. Flowsheet Row Cardiac Rehab from 05/02/2024 in [facility] Cardiac and Pulmonary Rehab  Date 05/02/24  Educator MB  Instruction Review Code 1- Verbalizes Understanding    Core Components/Risk Factors/Patient Goals Review:    Core Components/Risk Factors/Patient Goals at Discharge (Final Review):    ITP Comments:  ITP Comments     Row Name 04/26/24 1347 05/02/24 1540 05/15/24 0732 05/23/24 1007     ITP Comments Initial phone call completed. Diagnosis can be found in [facility] 10/13. EP Orientation scheduled for 05/02/24 at 1:30 pm. Completed and gym orientation for cardiac rehab. Initial ITP created and sent for review to Dr. Oneil Pinal, Medical Director. First full day of exercise!  Patient was oriented to gym and equipment including functions, settings, policies, and procedures.  Patient's individual exercise prescription and treatment plan were reviewed.  All starting workloads were established based on the results of the 6 minute walk test done at initial orientation visit.  The plan for exercise progression was also introduced and progression will be customized based on patient's performance and goals. 30 Day review completed. Medical Director ITP review done, changes made as directed, and signed approval by Medical Director.  New to program.       Comments: 30 day review

## 2024-05-24 ENCOUNTER — Encounter: Admitting: Emergency Medicine

## 2024-05-24 DIAGNOSIS — Z48812 Encounter for surgical aftercare following surgery on the circulatory system: Secondary | ICD-10-CM | POA: Diagnosis not present

## 2024-05-24 DIAGNOSIS — I214 Non-ST elevation (NSTEMI) myocardial infarction: Secondary | ICD-10-CM

## 2024-05-24 NOTE — Progress Notes (Signed)
 Daily Session Note  Patient Details  Name: Felicia Woods MRN: [id number] Date of Birth: 07/10/1955 Referring Provider:   Flowsheet Row Cardiac Rehab from 05/02/2024 in [facility] Cardiac and Pulmonary Rehab  Referring Provider Florencio Kava, MD    Encounter Date: 05/24/2024  Check In:  Session Check In - 05/24/24 0739       Check-In   Supervising physician immediately available to respond to emergencies See telemetry face sheet for immediately available ER MD    Location [facility]-Cardiac & Pulmonary Rehab    Staff Present Leita Franks RN,BSN;Joseph [facility] RCP,RRT,BSRT;Margaret Best, MS, Exercise Physiologist;Jason Elnor [facility]    Virtual Visit No    Medication changes reported     No    Fall or balance concerns reported    No    Tobacco Cessation No Change    Warm-up and Cool-down Performed on first and last piece of equipment    Resistance Training Performed Yes    VAD Patient? No    PAD/SET Patient? No      Pain Assessment   Currently in Pain? No/denies             Social History   Tobacco Use  Smoking Status Never  Smokeless Tobacco Never    Goals Met:  Independence with exercise equipment Exercise tolerated well No report of concerns or symptoms today Strength training completed today  Goals Unmet:  Not Applicable  Comments: Pt able to follow exercise prescription today without complaint.  Will continue to monitor for progression.    Dr. Oneil Pinal is Medical Director for [facility] Cardiac Rehabilitation.  Dr. Fuad Aleskerov is Medical Director for [facility] Pulmonary Rehabilitation.

## 2024-05-25 ENCOUNTER — Encounter

## 2024-05-28 ENCOUNTER — Encounter

## 2024-05-29 ENCOUNTER — Encounter

## 2024-05-29 LAB — MICROALBUMIN, URINE

## 2024-05-29 LAB — MICROALBUMIN / CREATININE URINE RATIO: Microalb Creat Ratio: <30

## 2024-05-30 ENCOUNTER — Encounter

## 2024-06-04 ENCOUNTER — Encounter

## 2024-06-05 ENCOUNTER — Encounter

## 2024-06-06 ENCOUNTER — Other Ambulatory Visit: Payer: Self-pay | Admitting: Physician Assistant

## 2024-06-06 ENCOUNTER — Telehealth: Payer: Self-pay

## 2024-06-06 ENCOUNTER — Encounter

## 2024-06-06 DIAGNOSIS — G4709 Other insomnia: Secondary | ICD-10-CM

## 2024-06-06 MED ORDER — ZOLPIDEM TARTRATE 5 MG PO TABS: 5.0000 mg | ORAL_TABLET | Freq: Every evening | ORAL | 2 refills | Status: AC | PRN

## 2024-06-06 NOTE — Telephone Encounter (Signed)
 Pt notified  that we sent

## 2024-06-07 ENCOUNTER — Encounter: Attending: Internal Medicine

## 2024-06-07 DIAGNOSIS — I214 Non-ST elevation (NSTEMI) myocardial infarction: Secondary | ICD-10-CM | POA: Diagnosis present

## 2024-06-07 NOTE — Progress Notes (Signed)
 Daily Session Note  Patient Details  Name: Felicia Woods MRN: [id number] Date of Birth: September 14, 1955 Referring Provider:   Flowsheet Row Cardiac Rehab from 05/02/2024 in [facility] Cardiac and Pulmonary Rehab  Referring Provider Florencio Kava, MD    Encounter Date: 06/07/2024  Check In:  Session Check In - 06/07/24 0736       Check-In   Supervising physician immediately available to respond to emergencies See telemetry face sheet for immediately available ER MD    Location [facility]-Cardiac & Pulmonary Rehab    Staff Present Leita Franks RN,BSN;Joseph Rolinda NORWOOD HARMAN Cecilie Delores, [place], RRT, CPFT;Jason Elnor RDN,LDN    Virtual Visit No    Medication changes reported     No    Fall or balance concerns reported    No    Tobacco Cessation No Change    Warm-up and Cool-down Performed on first and last piece of equipment    Resistance Training Performed Yes    VAD Patient? No    PAD/SET Patient? No      Pain Assessment   Currently in Pain? No/denies             Social History   Tobacco Use  Smoking Status Never  Smokeless Tobacco Never    Goals Met:  Independence with exercise equipment Exercise tolerated well No report of concerns or symptoms today Strength training completed today  Goals Unmet:  Not Applicable  Comments: Pt able to follow exercise prescription today without complaint.  Will continue to monitor for progression.    Dr. Oneil Pinal is Medical Director for [facility] Cardiac Rehabilitation.  Dr. Fuad Aleskerov is Medical Director for [facility] Pulmonary Rehabilitation.

## 2024-06-08 ENCOUNTER — Encounter

## 2024-06-11 ENCOUNTER — Encounter

## 2024-06-12 ENCOUNTER — Encounter

## 2024-06-13 ENCOUNTER — Encounter

## 2024-06-14 ENCOUNTER — Encounter: Admitting: Emergency Medicine

## 2024-06-14 DIAGNOSIS — I214 Non-ST elevation (NSTEMI) myocardial infarction: Secondary | ICD-10-CM

## 2024-06-14 NOTE — Progress Notes (Signed)
 Daily Session Note  Patient Details  Name: Felicia Woods MRN: [id number] Date of Birth: [birth year] Referring Provider:   Flowsheet Row Cardiac Rehab from 05/02/2024 in [facility] Cardiac and Pulmonary Rehab  Referring Provider Florencio Kava, MD    Encounter Date: 06/14/2024  Check In:  Session Check In - 06/14/24 0756       Check-In   Supervising physician immediately available to respond to emergencies See telemetry face sheet for immediately available ER MD    Location [facility]-Cardiac & Pulmonary Rehab    Staff Present Fairy Plater RCP,RRT,BSRT;Osborn Pullin RN,BSN;Kristen Coble RN,BC,MSN    Virtual Visit No    Medication changes reported     No    Fall or balance concerns reported    No    Tobacco Cessation No Change    Warm-up and Cool-down Performed on first and last piece of equipment    Resistance Training Performed Yes    VAD Patient? No    PAD/SET Patient? No      Pain Assessment   Currently in Pain? No/denies             Tobacco Use History[1]  Goals Met:  Independence with exercise equipment Exercise tolerated well No report of concerns or symptoms today Strength training completed today  Goals Unmet:  Not Applicable  Comments: Pt able to follow exercise prescription today without complaint.  Will continue to monitor for progression.    Dr. Oneil Pinal is Medical Director for [facility] Cardiac Rehabilitation.  Dr. Fuad Aleskerov is Medical Director for [facility] Pulmonary Rehabilitation.    [1]  Social History Tobacco Use  Smoking Status Never  Smokeless Tobacco Never

## 2024-06-15 ENCOUNTER — Other Ambulatory Visit: Payer: Self-pay | Admitting: Physician Assistant

## 2024-06-15 ENCOUNTER — Encounter

## 2024-06-15 DIAGNOSIS — I10 Essential (primary) hypertension: Secondary | ICD-10-CM

## 2024-06-18 ENCOUNTER — Ambulatory Visit: Admitting: Physician Assistant

## 2024-06-18 ENCOUNTER — Encounter

## 2024-06-19 ENCOUNTER — Encounter: Payer: Self-pay | Admitting: Internal Medicine

## 2024-06-19 ENCOUNTER — Telehealth: Payer: Self-pay | Admitting: Internal Medicine

## 2024-06-19 ENCOUNTER — Ambulatory Visit (INDEPENDENT_AMBULATORY_CARE_PROVIDER_SITE_OTHER): Admitting: Internal Medicine

## 2024-06-19 ENCOUNTER — Encounter

## 2024-06-19 ENCOUNTER — Other Ambulatory Visit: Payer: Self-pay

## 2024-06-19 VITALS — BP 110/70 | HR 59 | Temp 98.0°F | Resp 16 | Ht 67.0 in | Wt 251.0 lb

## 2024-06-19 DIAGNOSIS — J984 Other disorders of lung: Secondary | ICD-10-CM

## 2024-06-19 DIAGNOSIS — R0602 Shortness of breath: Secondary | ICD-10-CM

## 2024-06-19 DIAGNOSIS — I214 Non-ST elevation (NSTEMI) myocardial infarction: Secondary | ICD-10-CM | POA: Diagnosis not present

## 2024-06-19 DIAGNOSIS — G4733 Obstructive sleep apnea (adult) (pediatric): Secondary | ICD-10-CM | POA: Diagnosis not present

## 2024-06-19 DIAGNOSIS — I25118 Atherosclerotic heart disease of native coronary artery with other forms of angina pectoris: Secondary | ICD-10-CM | POA: Diagnosis not present

## 2024-06-19 DIAGNOSIS — G894 Chronic pain syndrome: Secondary | ICD-10-CM

## 2024-06-19 DIAGNOSIS — I10 Essential (primary) hypertension: Secondary | ICD-10-CM

## 2024-06-19 MED ORDER — HYDRALAZINE HCL 10 MG PO TABS: 10.0000 mg | ORAL_TABLET | Freq: Three times a day (TID) | ORAL | 0 refills | Status: AC

## 2024-06-19 NOTE — Patient Instructions (Signed)
Pulmonary Fibrosis  Pulmonary fibrosis is a type of lung disease that causes scarring. Over time, the scar tissue builds up in the air sacs of your lungs (alveoli). This makes it hard for you to breathe because less oxygen gets into your bloodstream. Scarring from pulmonary fibrosis is permanent and may lead to other serious health problems. What are the causes? There are many different causes of pulmonary fibrosis. In some cases, the cause is not known. This is called idiopathic pulmonary fibrosis. Other causes include: Exposure to chemicals and substances found in agricultural, farm, Holiday representative, or factory work. These include mold, asbestos, silica, metal dusts, and toxic fumes. Sarcoidosis. In this disease, areas of inflammatory cells (granulomas) form and most often affect the lungs. Autoimmune diseases. These include diseases such as rheumatoid arthritis, systemic sclerosis, or connective tissue disease. Taking certain medicines. These include drugs used in radiation therapy or used to treat seizures, heart problems, and some infections. What increases the risk? You are more likely to develop this condition if: You have a family history of the disease. You are an older person. The condition is more common in older adults. You have a history of smoking. You have a job that exposes you to certain chemicals. You have gastroesophageal reflux disease (GERD). What are the signs or symptoms? Symptoms of this condition include: Difficulty breathing that gets worse with activity. Shortness of breath (dyspnea). Dry, hacking cough. Rapid, shallow breathing during exercise or while at rest. Other symptoms may include: Loss of appetite or weight loss Tiredness (fatigue) or weakness. Bluish skin and lips. Rounded and enlarged fingertips (clubbing). How is this diagnosed? This condition may be diagnosed based on: Your symptoms and medical history. A physical exam. You may also have tests,  including: A test that involves looking inside your lungs with an instrument (bronchoscopy). Imaging studies of your lungs and heart. Tests to measure how well you are breathing (pulmonary function tests). Blood tests. Tests to see how well your lungs work while you are walking (pulmonary stress test). A procedure to remove a lung tissue sample to look at it under a microscope (biopsy). How is this treated? There is no cure for pulmonary fibrosis. Treatment focuses on managing symptoms and preventing scarring from getting worse. This may include: Medicines, such as: Steroids to prevent permanent lung changes. Medicines to suppress your body's defense system (immune system). Medicines to help with lung function by reducing inflammation or scarring. Ongoing monitoring with X-rays and lab work. Oxygen therapy. Pulmonary rehabilitation. Surgery. In some cases, a lung transplant is possible. Follow these instructions at home:  Medicines Take over-the-counter and prescription medicines only as told by your health care provider. Keep your vaccinations up to date as recommended by your health care provider. Activity Get regular exercise, but do not pick activities that are too strenuous for you. Ask your health care provider what activities are safe for you. If you have physical limitations, you may get exercise by walking, using a stationary bike, or doing chair exercises. Ask your health care provider about using oxygen while exercising. Do breathing exercises as told by your health care provider. Plan rest periods when you get tired. General instructions Do not use any products that contain nicotine or tobacco. These products include cigarettes, chewing tobacco, and vaping devices, such as e-cigarettes. If you need help quitting, ask your health care provider. If you are exposed to chemicals and substances at work, make sure that you wear a mask or respirator at all times. Learn to  manage  stress. If you need help to do this, ask your health care provider. Join a pulmonary rehabilitation program or a support group for people with pulmonary fibrosis. Eat small meals often so you do not get too full. Overeating can make breathing trouble worse. Maintain a healthy weight. Lose weight if you need to. Keep all follow-up visits. This is important. Where to find more information American Lung Association: www.lung.org National Heart, Lung, and Blood Institute: [employer] Pulmonary Fibrosis Foundation: pulmonaryfibrosis.org Contact a health care provider if: You have symptoms that do not get better with medicines. You are not able to be as active as usual. You have trouble taking a deep breath. You have a fever or chills. You have blue lips or skin. You have a lot of headaches. You cough up mucus that is dark in color. You have feelings of depression or sadness. You are unable to sleep because it is hard to breathe. Get help right away if: Your symptoms suddenly worsen. You have chest pain. You cough up blood. You get very confused or sleepy. These symptoms may be an emergency. Get help right away. Call 911. Do not wait to see if the symptoms will go away. Do not drive yourself to the hospital. Summary Pulmonary fibrosis is a type of lung disease that causes scar tissue to build up in the air sacs of your lungs (alveoli) over time. This makes it hard for you to breathe because less oxygen gets into your bloodstream. Scarring from pulmonary fibrosis is permanent and may lead to other serious health problems. You are more likely to develop this condition if you have a family history of the condition or a job that exposes you to certain chemicals. There is no cure for pulmonary fibrosis. Treatment focuses on managing symptoms and preventing scarring from getting worse. This information is not intended to replace advice given to you by your health care provider. Make sure  you discuss any questions you have with your health care provider. Document Revised: 02/10/2021 Document Reviewed: 02/10/2021 Elsevier Patient Education  2024 [employer].

## 2024-06-19 NOTE — Progress Notes (Signed)
 [facility] [address] [place], [place] [address]  Pulmonary Sleep Medicine   Office Visit Note  Patient Name: Felicia Woods DOB: [birth year] MRN [id number]  Date of Service: 06/19/2024  Complaints/HPI: She had a PFT done shows moderate restriction. Likely due to weight. She is on GLP1 for weight loss. In addition she had a CT chest done in August which had some inflitrates will need a repeat. Also on evaluation list for possible heart transplant. She has CAD noted on last cath. Was not amenable to angioplasty. EF preserved  Office Spirometry Results:     ROS  General: (-) fever, (-) chills, (-) night sweats, (-) weakness Skin: (-) rashes, (-) itching,. Eyes: (-) visual changes, (-) redness, (-) itching. Nose and Sinuses: (-) nasal stuffiness or itchiness, (-) postnasal drip, (-) nosebleeds, (-) sinus trouble. Mouth and Throat: (-) sore throat, (-) hoarseness. Neck: (-) swollen glands, (-) enlarged thyroid , (-) neck pain. Respiratory: - cough, (-) bloody sputum, + shortness of breath, - wheezing. Cardiovascular: - ankle swelling, (-) chest pain. Lymphatic: (-) lymph node enlargement. Neurologic: (-) numbness, (-) tingling. Psychiatric: (-) anxiety, (-) depression   Current Medication: Outpatient Encounter Medications as of 06/19/2024  Medication Sig Note   amLODipine  (NORVASC ) 5 MG tablet Take 1 tablet (5 mg total) by mouth daily.    aspirin  EC 81 MG tablet Take 81 mg by mouth daily.    atorvastatin  (LIPITOR ) 40 MG tablet Take 2 tablets (80 mg total) by mouth daily.    Blood Glucose Monitoring Suppl (ONETOUCH VERIO FLEX SYSTEM) w/Device KIT Use as directed twice a daily DX E11.65    Budeson-Glycopyrrol-Formoterol  (BREZTRI  AEROSPHERE) 160-9-4.8 MCG/ACT AERO Inhale 2 puffs into the lungs 2 (two) times daily.    clopidogrel  (PLAVIX ) 75 MG tablet Take 1 tablet (75 mg total) by mouth daily.    colchicine  0.6 MG tablet Take 1 tablet (0.6 mg total) by mouth 2  (two) times daily for 14 days.    empagliflozin  (JARDIANCE ) 25 MG TABS tablet Take one tab a day for diabetes    escitalopram  (LEXAPRO ) 10 MG tablet Take 1 tablet (10 mg total) by mouth daily.    Evolocumab  (REPATHA  SURECLICK) 140 MG/ML SOAJ Inject 140 mg into the skin every 14 (fourteen) days.    ezetimibe  (ZETIA ) 10 MG tablet Take 1 tablet (10 mg total) by mouth daily.    famotidine  (PEPCID ) 20 MG tablet Take 1 tablet (20 mg total) by mouth 2 (two) times daily.    furosemide  (LASIX ) 40 MG tablet Take 1 tablet (40 mg total) by mouth 2 (two) times daily.    glucose blood (ONETOUCH VERIO) test strip Use as instructed twice daily DX E11.65    HYDROcodone -acetaminophen  (NORCO/VICODIN) 5-325 MG tablet Take 1 tablet by mouth daily as needed for severe pain    isosorbide  mononitrate (IMDUR ) 120 MG 24 hr tablet Take 1 tablet (120 mg total) by mouth 2 (two) times daily.    losartan  (COZAAR ) 100 MG tablet Take 0.5 tablets (50 mg total) by mouth daily.    metoprolol  succinate (TOPROL -XL) 100 MG 24 hr tablet Take 1 tablet (100 mg total) by mouth daily. (Patient taking differently: Take 100 mg by mouth 2 (two) times daily.)    Misc. Devices (ADJUST BATH/SHOWER SEAT) MISC 1 shower seat for use at home to reduce risk of falling.    montelukast  (SINGULAIR ) 10 MG tablet Take 1 tablet (10 mg total) by mouth at bedtime.    nitroGLYCERIN  (NITROSTAT ) 0.4 MG SL tablet  DISSOLVE ONE TABLET UNDER THE TONGUE EVERY 5 MINUTES AS NEEDED FOR CHEST PAIN.  DO NOT EXCEED A TOTAL OF 3 DOSES IN 15 MINUTES (Patient taking differently: Place 0.4 mg under the tongue every 5 (five) minutes as needed for chest pain.) 02/15/2024: prn   OneTouch Delica Lancets 30G MISC USE 1  TO CHECK GLUCOSE TWICE DAILY AS DIRECTED    oxybutynin  (DITROPAN ) 5 MG tablet Take 1 tablet (5 mg total) by mouth daily.    PROAIR  RESPICLICK 108 (90 Base) MCG/ACT AEPB Inhale 2 puffs into the lungs every 6 (six) hours as needed. 02/15/2024: prn   ranolazine  (RANEXA )  1000 MG SR tablet Take 1 tablet (1,000 mg total) by mouth 2 (two) times daily.    tirzepatide  (MOUNJARO ) 5 MG/0.5ML Pen Inject 5 mg into the skin once a week.    zolpidem  (AMBIEN ) 5 MG tablet Take 1 tablet (5 mg total) by mouth at bedtime as needed. for sleep    [DISCONTINUED] hydrALAZINE  (APRESOLINE ) 10 MG tablet TAKE 1 TABLET BY MOUTH THREE TIMES DAILY    No facility-administered encounter medications on file as of 06/19/2024.    Surgical History: Past Surgical History:  Procedure Laterality Date   ABDOMINAL HYSTERECTOMY     CARDIAC CATHETERIZATION     CARDIAC CATHETERIZATION N/A 10/18/2015   Procedure: Left Heart Cath and Cors/Grafts Angiography;  Surgeon: Dorn JINNY Lesches, MD;  Location: [facility] INVASIVE CV LAB;  Service: Cardiovascular;  Laterality: N/A;   CARDIAC CATHETERIZATION N/A 10/18/2015   Procedure: Coronary Stent Intervention;  Surgeon: Dorn JINNY Lesches, MD;  Location: [facility] INVASIVE CV LAB;  Service: Cardiovascular;  Laterality: N/A;   CARDIAC SURGERY     CHOLECYSTECTOMY     COLONOSCOPY WITH PROPOFOL  N/A 12/02/2021   Procedure: COLONOSCOPY WITH PROPOFOL ;  Surgeon: Therisa Bi, MD;  Location: [facility] ENDOSCOPY;  Service: Gastroenterology;  Laterality: N/A;   CORONARY ANGIOPLASTY     CORONARY ARTERY BYPASS GRAFT     4 vessels - 2010   CORONARY STENT INTERVENTION N/A 02/16/2017   Procedure: CORONARY STENT INTERVENTION;  Surgeon: Florencio Cara BIRCH, MD;  Location: [facility] INVASIVE CV LAB;  Service: Cardiovascular;  Laterality: N/A;   CORONARY STENT INTERVENTION N/A 02/13/2019   Procedure: CORONARY STENT INTERVENTION;  Surgeon: Mady Bruckner, MD;  Location: [facility] INVASIVE CV LAB;  Service: Cardiovascular;  Laterality: N/A;  SVG to RCA   CORONARY STENT INTERVENTION Left 03/08/2019   Procedure: CORONARY STENT INTERVENTION;  Surgeon: Florencio Cara BIRCH, MD;  Location: [facility] INVASIVE CV LAB;  Service: Cardiovascular;  Laterality: Left;   CORONARY STENT INTERVENTION N/A 11/04/2020   Procedure: CORONARY  STENT INTERVENTION;  Surgeon: Mady Bruckner, MD;  Location: [facility] INVASIVE CV LAB;  Service: Cardiovascular;  Laterality: N/A;   CORONARY STENT INTERVENTION N/A 12/24/2021   Procedure: CORONARY STENT INTERVENTION;  Surgeon: Lawyer Bernardino Cough, MD;  Location: [facility] INVASIVE CV LAB;  Service: Cardiovascular;  Laterality: N/A;   ESOPHAGOGASTRODUODENOSCOPY N/A 12/02/2021   Procedure: ESOPHAGOGASTRODUODENOSCOPY (EGD);  Surgeon: Therisa Bi, MD;  Location: [facility] ENDOSCOPY;  Service: Gastroenterology;  Laterality: N/A;   LEFT HEART CATH AND CORONARY ANGIOGRAPHY Left 02/16/2017   Procedure: LEFT HEART CATH AND CORONARY ANGIOGRAPHY;  Surgeon: Hester Wolm JINNY, MD;  Location: [facility] INVASIVE CV LAB;  Service: Cardiovascular;  Laterality: Left;   LEFT HEART CATH AND CORONARY ANGIOGRAPHY Left 12/24/2021   Procedure: LEFT HEART CATH AND CORONARY ANGIOGRAPHY;  Surgeon: Hester Wolm JINNY, MD;  Location: [facility] INVASIVE CV LAB;  Service: Cardiovascular;  Laterality: Left;   LEFT HEART  CATH AND CORONARY ANGIOGRAPHY Left 06/17/2022   Procedure: LEFT HEART CATH AND CORONARY ANGIOGRAPHY;  Surgeon: Florencio Cara BIRCH, MD;  Location: [facility] INVASIVE CV LAB;  Service: Cardiovascular;  Laterality: Left;   LEFT HEART CATH AND CORS/GRAFTS ANGIOGRAPHY Left 11/23/2017   Procedure: LEFT HEART CATH AND CORS/GRAFTS ANGIOGRAPHY;  Surgeon: Hester Wolm PARAS, MD;  Location: [facility] INVASIVE CV LAB;  Service: Cardiovascular;  Laterality: Left;   LEFT HEART CATH AND CORS/GRAFTS ANGIOGRAPHY N/A 02/13/2019   Procedure: LEFT HEART CATH AND CORS/GRAFTS ANGIOGRAPHY;  Surgeon: Hester Wolm PARAS, MD;  Location: [facility] INVASIVE CV LAB;  Service: Cardiovascular;  Laterality: N/A;   LEFT HEART CATH AND CORS/GRAFTS ANGIOGRAPHY N/A 07/03/2019   Procedure: LEFT HEART CATH AND CORS/GRAFTS ANGIOGRAPHY;  Surgeon: Hester Wolm PARAS, MD;  Location: [facility] INVASIVE CV LAB;  Service: Cardiovascular;  Laterality: N/A;   LEFT HEART CATH AND CORS/GRAFTS ANGIOGRAPHY N/A  11/04/2020   Procedure: LEFT HEART CATH AND CORS/GRAFTS ANGIOGRAPHY;  Surgeon: Hester Wolm PARAS, MD;  Location: [facility] INVASIVE CV LAB;  Service: Cardiovascular;  Laterality: N/A;   LEFT HEART CATH AND CORS/GRAFTS ANGIOGRAPHY N/A 02/14/2024   Procedure: LEFT HEART CATH AND CORS/GRAFTS ANGIOGRAPHY;  Surgeon: Ammon Blunt, MD;  Location: [facility] INVASIVE CV LAB;  Service: Cardiovascular;  Laterality: N/A;    Medical History: Past Medical History:  Diagnosis Date   Asthma    Coronary artery disease    Diabetes mellitus without complication (HCC)    Heart attack (HCC)    Hyperlipidemia    Hypertension    MI (myocardial infarction) (HCC)    Migraine headache with aura    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    Family History: Family History  Problem Relation Age of Onset   Diabetes Mother    Diabetes Father    Cancer Father    Diabetes Brother     Social History: Social History   Socioeconomic History   Marital status: Widowed    Spouse name: lynwood   Number of children: Not on file   Years of education: Not on file   Highest education level: Not on file  Occupational History   Not on file  Tobacco Use   Smoking status: Never   Smokeless tobacco: Never  Vaping Use   Vaping status: Never Used  Substance and Sexual Activity   Alcohol use: No    Alcohol/week: 0.0 standard drinks of alcohol   Drug use: No   Sexual activity: Yes  Other Topics Concern   Not on file  Social History Narrative   Not on file   Social Drivers of Health   Tobacco Use: Low Risk (06/19/2024)   Patient History    Smoking Tobacco Use: Never    Smokeless Tobacco Use: Never    Passive Exposure: Not on file  Financial Resource Strain: Low Risk  (01/27/2024)   Received from [facility]   Overall Financial Resource Strain (CARDIA)    Difficulty of Paying Living Expenses: Not very hard  Food Insecurity: No Food Insecurity (02/23/2024)   Epic    Worried About Programme Researcher, Broadcasting/film/video  in the Last Year: Never true    Ran Out of Food in the Last Year: Never true  Recent Concern: Food Insecurity - Food Insecurity Present (01/27/2024)   Received from [facility]   Epic    Within the past 12 months, you worried that your food would run out before you got the money to buy more.: Sometimes true  Within the past 12 months, the food you bought just didn't last and you didn't have money to get more.: Sometimes true  Transportation Needs: No Transportation Needs (02/23/2024)   Epic    Lack of Transportation (Medical): No    Lack of Transportation (Non-Medical): No  Physical Activity: Not on file  Stress: Not on file  Social Connections: Socially Integrated (02/23/2024)   Social Connection and Isolation Panel    Frequency of Communication with Friends and Family: More than three times a week    Frequency of Social Gatherings with Friends and Family: Never    Attends Religious Services: More than 4 times per year    Active Member of Clubs or Organizations: Yes    Attends Banker Meetings: Never    Marital Status: Married  Catering Manager Violence: Not At Risk (02/23/2024)   Epic    Fear of Current or Ex-Partner: No    Emotionally Abused: No    Physically Abused: No    Sexually Abused: No  Depression (PHQ2-9): Low Risk (06/07/2024)   Depression (PHQ2-9)    PHQ-2 Score: 3  Recent Concern: Depression (PHQ2-9) - High Risk (05/02/2024)   Depression (PHQ2-9)    PHQ-2 Score: 12  Alcohol Screen: Not on file  Housing: High Risk (02/23/2024)   Epic    Unable to Pay for Housing in the Last Year: Yes    Number of Times Moved in the Last Year: 0    Homeless in the Last Year: No  Utilities: Not At Risk (02/23/2024)   Epic    Threatened with loss of utilities: No  Health Literacy: Not on file    Vital Signs: Blood pressure 110/70, pulse (!) 59, temperature 98 F (36.7 C), resp. rate 16, height 5' 7 (1.702 m), weight 251 lb (113.9 kg), SpO2  99%.  Examination: General Appearance: The patient is well-developed, well-nourished, and in no distress. Skin: Gross inspection of skin unremarkable. Head: normocephalic, no gross deformities. Eyes: no gross deformities noted. ENT: ears appear grossly normal no exudates. Neck: Supple. No thyromegaly. No LAD. Respiratory: no rhonchin noted. Cardiovascular: Normal S1 and S2 without murmur or rub. Extremities: No cyanosis. pulses are equal. Neurologic: Alert and oriented. No involuntary movements.  LABS: Recent Results (from the past 2160 hours)  Pulmonary Function Test     Status: None   Collection Time: 04/25/24  3:13 PM  Result Value Ref Range   FEV1     FVC     FEV1/FVC     TLC     DLCO    POCT HgB A1C     Status: Abnormal   Collection Time: 05/10/24 10:32 AM  Result Value Ref Range   Hemoglobin A1C 7.8 (A) 4.0 - 5.6 %   HbA1c POC (<> result, manual entry)     HbA1c, POC (prediabetic range)     HbA1c, POC (controlled diabetic range)    Glucose, capillary     Status: Abnormal   Collection Time: 05/15/24  7:24 AM  Result Value Ref Range   Glucose-Capillary 171 (H) 70 - 99 mg/dL    Comment: Glucose reference range applies only to samples taken after fasting for at least 8 hours.  Glucose, capillary     Status: Abnormal   Collection Time: 05/15/24  8:36 AM  Result Value Ref Range   Glucose-Capillary 148 (H) 70 - 99 mg/dL    Comment: Glucose reference range applies only to samples taken after fasting for at least 8 hours.  Glucose,  capillary     Status: Abnormal   Collection Time: 05/17/24  7:26 AM  Result Value Ref Range   Glucose-Capillary 284 (H) 70 - 99 mg/dL    Comment: Glucose reference range applies only to samples taken after fasting for at least 8 hours.  Glucose, capillary     Status: Abnormal   Collection Time: 05/17/24  8:32 AM  Result Value Ref Range   Glucose-Capillary 200 (H) 70 - 99 mg/dL    Comment: Glucose reference range applies only to samples taken  after fasting for at least 8 hours.    Radiology: CT Angio Chest PE W and/or Wo Contrast Result Date: 02/22/2024 CLINICAL DATA:  Concern for acute pulmonary embolism.  Chest pain EXAM: CT ANGIOGRAPHY CHEST WITH CONTRAST TECHNIQUE: Multidetector CT imaging of the chest was performed using the standard protocol during bolus administration of intravenous contrast. Multiplanar CT image reconstructions and MIPs were obtained to evaluate the vascular anatomy. RADIATION DOSE REDUCTION: This exam was performed according to the departmental dose-optimization program which includes automated exposure control, adjustment of the mA and/or kV according to patient size and/or use of iterative reconstruction technique. CONTRAST:  75mL OMNIPAQUE  IOHEXOL  350 MG/ML SOLN COMPARISON:  None Available. FINDINGS: Cardiovascular: No filling defects within the pulmonary arteries to suggest acute pulmonary embolism. Mediastinum/Nodes: No axillary or supraclavicular adenopathy. No mediastinal or hilar adenopathy. No pericardial fluid. Esophagus normal. Lungs/Pleura: No suspicious pulmonary nodules. Normal pleural. Airways normal. No pulmonary infarction. No pneumonia. No pleural fluid. No pneumothorax Upper Abdomen: Limited view of the liver, kidneys, pancreas are unremarkable. Normal adrenal glands. Musculoskeletal: No aggressive osseous lesion. Review of the MIP images confirms the above findings. IMPRESSION: 1. No evidence acute pulmonary embolism. 2. No acute pulmonary parenchymal findings. Electronically Signed   By: Jackquline Boxer M.D.   On: 02/22/2024 08:42   DG Chest Portable 1 View Result Date: 02/22/2024 CLINICAL DATA:  Chest pain and shortness of breath. EXAM: PORTABLE CHEST 1 VIEW COMPARISON:  02/15/2024 FINDINGS: Similar linear densities in both lung bases compatible with chronic atelectasis or scarring. No edema or new focal airspace consolidation. No substantial pleural effusion. The cardio pericardial silhouette is  enlarged. Telemetry leads overlie the chest. IMPRESSION: Chronic atelectasis or scarring in the lung bases. No acute cardiopulmonary findings. Electronically Signed   By: Camellia Candle M.D.   On: 02/22/2024 06:23    No results found.  No results found.  Assessment and Plan: Patient Active Problem List   Diagnosis Date Noted   Angina at rest 02/22/2024   Dyslipidemia 02/09/2024   GERD without esophagitis 02/09/2024   Asthma, chronic 02/09/2024   Pain in joint of left shoulder 02/23/2022   Vulvovaginal candidiasis 01/19/2022   Arthritis of right knee 01/04/2022   Pain in joint of right knee 01/04/2022   Stiffness of right shoulder joint 10/16/2021   Pain in joint of right elbow 10/01/2021   Localized osteoarthritis of knees, bilateral 06/13/2021   HLD (hyperlipidemia) 06/09/2021   Depression 06/09/2021   Chronic diastolic CHF (congestive heart failure) (HCC) 06/09/2021   Arthritis of left shoulder region 04/08/2021   Arthritis of right shoulder region 04/08/2021   Bilateral shoulder pain 04/08/2021   Leg pain 03/17/2021   Abnormal ECG 03/12/2021   Stable angina 10/29/2020   Acute non-recurrent maxillary sinusitis 08/01/2020   Cough 08/01/2020   Pain in joint of left knee 07/17/2020   Uncontrolled type 2 diabetes mellitus with hyperglycemia (HCC) 03/25/2020   Neck pain 03/25/2020   Other insomnia 03/25/2020  Obesity (BMI 30-39.9) 03/25/2020   Atherosclerosis of aorta 03/19/2020   Pain in right knee 03/04/2020   Lumbar radiculopathy 03/04/2020   Unstable angina (HCC) 02/13/2019   Acute bilateral low back pain without sciatica 08/21/2018   Asthma 08/21/2018   Coronary artery disease involving coronary bypass graft of native heart without angina pectoris 08/21/2018   Depression, major, single episode, 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/21/2018   Chest pain 11/02/2017   S/P drug eluting coronary stent placement 02/16/2017   Numbness and tingling 12/01/2016   Chronic tension-type  headache, intractable 12/01/2016   Osteoarthritis of knee 11/12/2016   Class 2 obesity 05/13/2016   Hx of CABG 2010 11/12/2015   Diabetes mellitus (HCC) 11/12/2015   Morbid obesity (HCC) 11/12/2015   SOBOE (shortness of breath on exertion) 11/12/2015   RBBB 11/12/2015   ST elevation myocardial infarction (STEMI) of inferior wall (HCC) 11/03/2015   SVG-RCA throbectomy and DES 10/18/15    Hypertensive heart disease without heart failure    Vertigo    STEMI 10/18/15 10/18/2015   NSTEMI (non-ST elevated myocardial infarction) (HCC) 10/18/2015   Intractable chronic migraine without aura and without status migrainosus 05/08/2015   Essential hypertension 03/17/2015   Acute on chronic diastolic CHF (congestive heart failure), NYHA class 3 (HCC) 08/26/2014   CAD (coronary artery disease) 08/26/2014   Headache 08/06/2014   Mixed hyperlipidemia 07/30/2014    1. Restrictive lung disease (Primary) We are continuing to follow her at CT scans serially.  Will go ahead and schedule her for a follow-up high-resolution CT. - CT Chest High Resolution; Future  2. Shortness of breath Multifactorial secondary to underlying cardiopulmonary disease.  Patient has been on Breztri  needs to continue with inhalers as prescribed  3. Coronary artery disease of native heart with stable angina pectoris, unspecified vessel or lesion type Follow-up with cardiology  4. Chronic pain syndrome Pain management under control continue with PCP follow-up  5. OSA (obstructive sleep apnea) Ordered a sleep study for assessment of obstructive sleep apnea patient has hypersomnia and daytime somnolence associated. - PSG Sleep Study; Future   General Counseling: I have discussed the findings of the evaluation and examination with Mika.  I have also discussed any further diagnostic evaluation thatmay be needed or ordered today. Lyfe verbalizes understanding of the findings of todays visit. We also reviewed her medications  today and discussed drug interactions and side effects including but not limited excessive drowsiness and altered mental states. We also discussed that there is always a risk not just to her but also people around her. she has been encouraged to call the office with any questions or concerns that should arise related to todays visit.  No orders of the defined types were placed in this encounter.    Time spent: 16  I have personally obtained a history, examined the patient, evaluated laboratory and imaging results, formulated the assessment and plan and placed orders.    Elfreda DELENA Bathe, MD [facility] Pulmonary and Critical Care Sleep medicine

## 2024-06-19 NOTE — Telephone Encounter (Signed)
 Awaiting 06/19/24 office notes for SS order-Toni

## 2024-06-19 NOTE — Progress Notes (Signed)
 Daily Session Note  Patient Details  Name: Felicia Woods MRN: [id number] Date of Birth: 07-25-1955 Referring Provider:   Flowsheet Row Cardiac Rehab from 05/02/2024 in [facility] Cardiac and Pulmonary Rehab  Referring Provider Florencio Kava, MD    Encounter Date: 06/19/2024  Check In:  Session Check In - 06/19/24 0725       Check-In   Supervising physician immediately available to respond to emergencies See telemetry face sheet for immediately available ER MD    Location [facility]-Cardiac & Pulmonary Rehab    Staff Present Burnard Davenport RN,BSN,MPA;Margaret Best, MS, Exercise Physiologist;Jason Elnor RDN,LDN    Virtual Visit No    Medication changes reported     No    Fall or balance concerns reported    No    Tobacco Cessation No Change    Warm-up and Cool-down Performed on first and last piece of equipment    Resistance Training Performed Yes    VAD Patient? No    PAD/SET Patient? No      Pain Assessment   Currently in Pain? No/denies             Tobacco Use History[1]  Goals Met:  Independence with exercise equipment Exercise tolerated well No report of concerns or symptoms today Strength training completed today  Goals Unmet:  Not Applicable  Comments: Pt able to follow exercise prescription today without complaint.  Will continue to monitor for progression.    Dr. Oneil Pinal is Medical Director for [facility] Cardiac Rehabilitation.  Dr. Fuad Aleskerov is Medical Director for [facility] Pulmonary Rehabilitation.    [1]  Social History Tobacco Use  Smoking Status Never  Smokeless Tobacco Never

## 2024-06-20 ENCOUNTER — Encounter

## 2024-06-20 DIAGNOSIS — I214 Non-ST elevation (NSTEMI) myocardial infarction: Secondary | ICD-10-CM

## 2024-06-20 DIAGNOSIS — Z955 Presence of coronary angioplasty implant and graft: Secondary | ICD-10-CM

## 2024-06-20 NOTE — Progress Notes (Signed)
 Cardiac Individual Treatment Plan  Patient Details  Name: Felicia Woods MRN: [id number] Date of Birth: August 22, 1955 Referring Provider:   Flowsheet Row Cardiac Rehab from 05/02/2024 in [facility] Cardiac and Pulmonary Rehab  Referring Provider Florencio Kava, MD    Initial Encounter Date:  Flowsheet Row Cardiac Rehab from 05/02/2024 in [facility] Cardiac and Pulmonary Rehab  Date 05/02/24    Visit Diagnosis: NSTEMI (non-ST elevated myocardial infarction) (HCC)  S/P coronary artery stent placement  Patient's Home Medications on Admission: Current Medications[1]  Past Medical History: Past Medical History:  Diagnosis Date   Asthma    Coronary artery disease    Diabetes mellitus without complication (HCC)    Heart attack (HCC)    Hyperlipidemia    Hypertension    MI (myocardial infarction) (HCC)    Migraine headache with aura    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    Tobacco Use: Tobacco Use History[2]  Labs: Review Flowsheet  More data exists      Latest Ref Rng & Units 12/17/2022 06/01/2023 12/19/2023 02/10/2024 05/10/2024  Labs for ITP Cardiac and Pulmonary Rehab  Cholestrol 0 - 200 mg/dL - - - 796  -  LDL (calc) 0 - 99 mg/dL - - - 852  -  HDL-C >59 mg/dL - - - 42  -  Trlycerides <150 mg/dL - - - 70  -  Hemoglobin A1c 4.0 - 5.6 % 7.5  7.6  7.6  - 7.8      Exercise Target Goals: Exercise Program Goal: Individual exercise prescription set using results from initial 6 min walk test and THRR while considering  patients activity barriers and safety.   Exercise Prescription Goal: Initial exercise prescription builds to 30-45 minutes a day of aerobic activity, 2-3 days per week.  Home exercise guidelines will be given to patient during program as part of exercise prescription that the participant will acknowledge.   Education: Aerobic Exercise: - Group verbal and visual presentation on the components of exercise prescription. Introduces F.I.T.T principle from ACSM for exercise  prescriptions.  Reviews F.I.T.T. principles of aerobic exercise including progression. Written material provided at class time. Flowsheet Row Cardiac Rehab from 06/10/2022 in [facility] Cardiac and Pulmonary Rehab  Education need identified 02/08/22    Education: Resistance Exercise: - Group verbal and visual presentation on the components of exercise prescription. Introduces F.I.T.T principle from ACSM for exercise prescriptions  Reviews F.I.T.T. principles of resistance exercise including progression. Written material provided at class time.    Education: Exercise & Equipment Safety: - Individual verbal instruction and demonstration of equipment use and safety with use of the equipment. Flowsheet Row Cardiac Rehab from 05/02/2024 in [facility] Cardiac and Pulmonary Rehab  Date 05/02/24  Educator MB  Instruction Review Code 1- Verbalizes Understanding    Education: Exercise Physiology & General Exercise Guidelines: - Group verbal and written instruction with models to review the exercise physiology of the cardiovascular system and associated critical values. Provides general exercise guidelines with specific guidelines to those with heart or lung disease. Written material provided at class time.   Education: Flexibility, Balance, Mind/Body Relaxation: - Group verbal and visual presentation with interactive activity on the components of exercise prescription. Introduces F.I.T.T principle from ACSM for exercise prescriptions. Reviews F.I.T.T. principles of flexibility and balance exercise training including progression. Also discusses the mind body connection.  Reviews various relaxation techniques to help reduce and manage stress (i.e. Deep breathing, progressive muscle relaxation, and visualization). Balance handout provided to take home. Written material provided at  class time.   Activity Barriers & Risk Stratification:  Activity Barriers & Cardiac Risk Stratification - 05/02/24 1544       Activity  Barriers & Cardiac Risk Stratification   Activity Barriers Joint Problems;Chest Pain/Angina   right knee pain   Cardiac Risk Stratification High          6 Minute Walk:  6 Minute Walk     Row Name 05/02/24 1540         6 Minute Walk   Phase Initial     Distance 370 feet     Walk Time 3 minutes     # of Rest Breaks 1  2nd half of test     MPH 1.4     METS 1.16     RPE 13     Perceived Dyspnea  2     VO2 Peak 4.07     Symptoms Yes (comment)     Comments Chest pain 10/10 during walk at the 3 minute mark. Took 1 Nitroglycerin  and relieved with rest and did not continue walking     Resting HR 86 bpm     Resting BP 126/70     Resting Oxygen  Saturation  99 %     Exercise Oxygen  Saturation  during 6 min walk 94 %     Max Ex. HR 142 bpm     Max Ex. BP 130/84     2 Minute Post BP 110/82        Oxygen  Initial Assessment:   Oxygen  Re-Evaluation:   Oxygen  Discharge (Final Oxygen  Re-Evaluation):   Initial Exercise Prescription:  Initial Exercise Prescription - 05/02/24 1500       Date of Initial Exercise RX and Referring Provider   Date 05/02/24    Referring Provider Florencio Kava, MD      Oxygen    Maintain Oxygen  Saturation 88% or higher      Recumbant Bike   Level 1    RPM 50    Watts 15    Minutes 15    METs 1.16      NuStep   Level 1    SPM 80    Minutes 15    METs 1.16      T5 Nustep   Level 1    SPM 80    Minutes 15    METs 1.16      Biostep-RELP   Level 1    SPM 50    Minutes 15    METs 1.16      Prescription Details   Frequency (times per week) 3    Duration Progress to 30 minutes of continuous aerobic without signs/symptoms of physical distress      Intensity   THRR 40-80% of Max Heartrate 112-139    Ratings of Perceived Exertion 11-13    Perceived Dyspnea 0-4      Progression   Progression Continue to progress workloads to maintain intensity without signs/symptoms of physical distress.      Resistance Training   Training  Prescription Yes    Weight 4 lb    Reps 10-15          Perform Capillary Blood Glucose checks as needed.  Exercise Prescription Changes:   Exercise Prescription Changes     Row Name 05/02/24 1500 05/23/24 1100 06/07/24 1100         Response to Exercise   Blood Pressure (Admit) 126/70 110/64 118/62     Blood Pressure (Exercise) 130/84 128/78 130/70  Blood Pressure (Exit) 110/82 120/64 102/62     Heart Rate (Admit) 86 bpm 75 bpm 60 bpm     Heart Rate (Exercise) 142 bpm 86 bpm 86 bpm     Heart Rate (Exit) 97 bpm 65 bpm 70 bpm     Oxygen  Saturation (Admit) 99 % -- --     Oxygen  Saturation (Exercise) 94 % -- --     Oxygen  Saturation (Exit) 96 % -- --     Rating of Perceived Exertion (Exercise) 13 15 13      Perceived Dyspnea (Exercise) 2 -- --     Symptoms Chest pain 10/10, took 1 nitroglycerin  at the 3 minute mark, relieved with rest, did not continue walking none none     Comments results first 2 weeks of exercise --     Duration -- Progress to 30 minutes of  aerobic without signs/symptoms of physical distress Continue with 30 min of aerobic exercise without signs/symptoms of physical distress.     Intensity -- THRR unchanged THRR unchanged       Progression   Progression -- Continue to progress workloads to maintain intensity without signs/symptoms of physical distress. Continue to progress workloads to maintain intensity without signs/symptoms of physical distress.     Average METs 1.16 1.5 1.38       Resistance Training   Training Prescription -- Yes Yes     Weight -- 4lb 4 lb     Reps -- 10-15 10-15       Interval Training   Interval Training -- No No       NuStep   Level -- 2 1     Minutes -- 15 30     METs -- 1.6 1.6       Biostep-RELP   Level -- 1 --     Minutes -- 15 --     METs -- 2 --       Oxygen    Maintain Oxygen  Saturation -- 88% or higher 88% or higher        Exercise Comments:   Exercise Goals and Review:   Exercise Goals     Row  Name 05/02/24 1548             Exercise Goals   Increase Physical Activity Yes       Intervention Provide advice, education, support and counseling about physical activity/exercise needs.;Develop an individualized exercise prescription for aerobic and resistive training based on initial evaluation findings, risk stratification, comorbidities and participant's personal goals.       Expected Outcomes Short Term: Attend rehab on a regular basis to increase amount of physical activity.;Long Term: Add in home exercise to make exercise part of routine and to increase amount of physical activity.;Long Term: Exercising regularly at least 3-5 days a week.       Increase Strength and Stamina Yes       Intervention Provide advice, education, support and counseling about physical activity/exercise needs.;Develop an individualized exercise prescription for aerobic and resistive training based on initial evaluation findings, risk stratification, comorbidities and participant's personal goals.       Expected Outcomes Short Term: Increase workloads from initial exercise prescription for resistance, speed, and METs.;Short Term: Perform resistance training exercises routinely during rehab and add in resistance training at home;Long Term: Improve cardiorespiratory fitness, muscular endurance and strength as measured by increased METs and functional capacity ( )       Able to understand and use rate of perceived exertion (RPE) scale  Yes       Intervention Provide education and explanation on how to use RPE scale       Expected Outcomes Short Term: Able to use RPE daily in rehab to express subjective intensity level;Long Term:  Able to use RPE to guide intensity level when exercising independently       Able to understand and use Dyspnea scale Yes       Intervention Provide education and explanation on how to use Dyspnea scale       Expected Outcomes Short Term: Able to use Dyspnea scale daily in rehab to express  subjective sense of shortness of breath during exertion;Long Term: Able to use Dyspnea scale to guide intensity level when exercising independently       Knowledge and understanding of Target Heart Rate Range (THRR) Yes       Intervention Provide education and explanation of THRR including how the numbers were predicted and where they are located for reference       Expected Outcomes Short Term: Able to state/look up THRR;Short Term: Able to use daily as guideline for intensity in rehab;Long Term: Able to use THRR to govern intensity when exercising independently       Able to check pulse independently Yes       Intervention Provide education and demonstration on how to check pulse in carotid and radial arteries.;Review the importance of being able to check your own pulse for safety during independent exercise       Expected Outcomes Short Term: Able to explain why pulse checking is important during independent exercise;Long Term: Able to check pulse independently and accurately       Understanding of Exercise Prescription Yes       Intervention Provide education, explanation, and written materials on patient's individual exercise prescription       Expected Outcomes Short Term: Able to explain program exercise prescription;Long Term: Able to explain home exercise prescription to exercise independently          Exercise Goals Re-Evaluation :  Exercise Goals Re-Evaluation     Row Name 05/15/24 0732 05/23/24 1118 06/07/24 1103         Exercise Goal Re-Evaluation   Exercise Goals Review Increase Physical Activity;Able to understand and use rate of perceived exertion (RPE) scale;Knowledge and understanding of Target Heart Rate Range (THRR);Understanding of Exercise Prescription;Increase Strength and Stamina;Able to check pulse independently;Able to understand and use Dyspnea scale Increase Physical Activity;Increase Strength and Stamina;Understanding of Exercise Prescription Increase Physical  Activity;Increase Strength and Stamina;Understanding of Exercise Prescription     Comments Reviewed RPE and dyspnea scale, THR and program prescription with pt today.  Pt voiced understanding and was given a copy of goals to take home. Chanti is off to a good start in the program, and was able to attend her first 2 sessions during this review period. During these sessions she was able to use the T4 nustep at level 2, and the biostep at level 1. We will continue to monitor her progress in the program. Meghana only attended two sessions of rehab since the last review. She worked on the T4 nustep for 30 minutes during both sessions. She did see a decrease in her workload from level 2 to level 1 on the T4 nustep. We will continue to monitor her progress in the program.     Expected Outcomes Short: Use RPE daily to regulate intensity. Long: Follow program prescription in THR. Short: Continue to follow exercise prescription. Long: Continue  exercise to improve strength and stamina. Short: Attend rehab more consistently. Long: Continue exercise to improve strength and stamina.        Discharge Exercise Prescription (Final Exercise Prescription Changes):  Exercise Prescription Changes - 06/07/24 1100       Response to Exercise   Blood Pressure (Admit) 118/62    Blood Pressure (Exercise) 130/70    Blood Pressure (Exit) 102/62    Heart Rate (Admit) 60 bpm    Heart Rate (Exercise) 86 bpm    Heart Rate (Exit) 70 bpm    Rating of Perceived Exertion (Exercise) 13    Symptoms none    Duration Continue with 30 min of aerobic exercise without signs/symptoms of physical distress.    Intensity THRR unchanged      Progression   Progression Continue to progress workloads to maintain intensity without signs/symptoms of physical distress.    Average METs 1.38      Resistance Training   Training Prescription Yes    Weight 4 lb    Reps 10-15      Interval Training   Interval Training No      NuStep   Level 1     Minutes 30    METs 1.6      Oxygen    Maintain Oxygen  Saturation 88% or higher          Nutrition:  Target Goals: Understanding of nutrition guidelines, daily intake of sodium 1500mg , cholesterol 200mg , calories 30% from fat and 7% or less from saturated fats, daily to have 5 or more servings of fruits and vegetables.  Education: Nutrition 1 -Group instruction provided by verbal, written material, interactive activities, discussions, models, and posters to present general guidelines for heart healthy nutrition including macronutrients, label reading, and promoting whole foods over processed counterparts. Education serves as pensions consultant of discussion of heart healthy eating for all. Written material provided at class time.    Education: Nutrition 2 -Group instruction provided by verbal, written material, interactive activities, discussions, models, and posters to present general guidelines for heart healthy nutrition including sodium, cholesterol, and saturated fat. Providing guidance of habit forming to improve blood pressure, cholesterol, and body weight. Written material provided at class time.     Biometrics:  Pre Biometrics - 05/02/24 1549       Pre Biometrics   Height 5' 6.14 (1.68 m)    Weight 243 lb 11.2 oz (110.5 kg)    Waist Circumference 48.2 inches    Hip Circumference 55.8 inches    Waist to Hip Ratio 0.86 %    BMI (Calculated) 39.17    Single Leg Stand 23 seconds           Nutrition Therapy Plan and Nutrition Goals:  Nutrition Therapy & Goals - 05/02/24 1549       Personal Nutrition Goals   Nutrition Goal will meet w/ RD on 11/5      Intervention Plan   Intervention Prescribe, educate and counsel regarding individualized specific dietary modifications aiming towards targeted core components such as weight, hypertension, lipid management, diabetes, heart failure and other comorbidities.    Expected Outcomes Short Term Goal: Understand basic  principles of dietary content, such as calories, fat, sodium, cholesterol and nutrients.          Nutrition Assessments:  MEDIFICTS Score Key: >=70 Need to make dietary changes  40-70 Heart Healthy Diet <= 40 Therapeutic Level Cholesterol Diet  Flowsheet Row Cardiac Rehab from 05/15/2024 in [facility] Cardiac and Pulmonary Rehab  Picture  Your Plate Total Score on Admission 43   Picture Your Plate Scores: <59 Unhealthy dietary pattern with much room for improvement. 41-50 Dietary pattern unlikely to meet recommendations for good health and room for improvement. 51-60 More healthful dietary pattern, with some room for improvement.  >60 Healthy dietary pattern, although there may be some specific behaviors that could be improved.    Nutrition Goals Re-Evaluation:  Nutrition Goals Re-Evaluation     Row Name 05/24/24 [phone]             Goals   Comment Jeniya would like to set up a meeting with the RD, she had to cancel her appointment on 11/5.       Expected Outcome Short: Meet with RD Long: Follow plan/goals set with RD          Nutrition Goals Discharge (Final Nutrition Goals Re-Evaluation):  Nutrition Goals Re-Evaluation - 05/24/24 0743       Goals   Comment Zalaya would like to set up a meeting with the RD, she had to cancel her appointment on 11/5.    Expected Outcome Short: Meet with RD Long: Follow plan/goals set with RD          Psychosocial: Target Goals: Acknowledge presence or absence of significant depression and/or stress, maximize coping skills, provide positive support system. Participant is able to verbalize types and ability to use techniques and skills needed for reducing stress and depression.   Education: Stress, Anxiety, and Depression - Group verbal and visual presentation to define topics covered.  Reviews how body is impacted by stress, anxiety, and depression.  Also discusses healthy ways to reduce stress and to treat/manage anxiety and depression.  Written material provided at class time. Flowsheet Row Cardiac Rehab from 06/10/2022 in [facility] Cardiac and Pulmonary Rehab  Education need identified 02/08/22    Education: Sleep Hygiene -Provides group verbal and written instruction about how sleep can affect your health.  Define sleep hygiene, discuss sleep cycles and impact of sleep habits. Review good sleep hygiene tips.   Initial Review & Psychosocial Screening:  Initial Psych Review & Screening - 04/26/24 1409       Initial Review   Current issues with Current Stress Concerns    Source of Stress Concerns Retirement/disability;Unable to perform yard/household activities    Comments reports stress due to uncontrolled pain and frustration that she is not able to get pain medication that has worked for her in the past      Family Dynamics   Good Support System? Yes      Barriers   Psychosocial barriers to participate in program There are no identifiable barriers or psychosocial needs.;The patient should benefit from training in stress management and relaxation.      Screening Interventions   Interventions Encouraged to exercise;To provide support and resources with identified psychosocial needs;Provide feedback about the scores to participant    Expected Outcomes Short Term goal: Utilizing psychosocial counselor, staff and physician to assist with identification of specific Stressors or current issues interfering with healing process. Setting desired goal for each stressor or current issue identified.;Long Term Goal: Stressors or current issues are controlled or eliminated.;Short Term goal: Identification and review with participant of any Quality of Life or Depression concerns found by scoring the questionnaire.;Long Term goal: The participant improves quality of Life and PHQ9 Scores as seen by post scores and/or verbalization of changes          Quality of Life Scores:   Quality of Life -  05/15/24 0735       Quality of Life    Select Quality of Life      Quality of Life Scores   Health/Function Pre 7.9 %    Socioeconomic Pre 23.38 %    Psych/Spiritual Pre 13.71 %    Family Pre 27.6 %    GLOBAL Pre 15.41 %         Scores of 19 and below usually indicate a poorer quality of life in these areas.  A difference of  2-3 points is a clinically meaningful difference.  A difference of 2-3 points in the total score of the Quality of Life Index has been associated with significant improvement in overall quality of life, self-image, physical symptoms, and general health in studies assessing change in quality of life.  PHQ-9: Review Flowsheet  More data exists      06/07/2024 05/02/2024 12/19/2023 09/05/2023 12/17/2022  Depression screen PHQ 2/9  Decreased Interest 1 2 0 0 0  Down, Depressed, Hopeless 0 2 0 0 0  PHQ - 2 Score 1 4 0 0 0  Altered sleeping 1 3 - - -  Tired, decreased energy 1 2 - - -  Change in appetite 0 2 - - -  Feeling bad or failure about yourself  0 0 - - -  Trouble concentrating 0 0 - - -  Moving slowly or fidgety/restless 0 1 - - -  Suicidal thoughts 0 0 - - -  PHQ-9 Score 3 12  - - -  Difficult doing work/chores Somewhat difficult Somewhat difficult - - -    Details       Data saved with a previous flowsheet row definition        Interpretation of Total Score  Total Score Depression Severity:  1-4 = Minimal depression, 5-9 = Mild depression, 10-14 = Moderate depression, 15-19 = Moderately severe depression, 20-27 = Severe depression   Psychosocial Evaluation and Intervention:  Psychosocial Evaluation - 04/26/24 1414       Psychosocial Evaluation & Interventions   Interventions Stress management education;Relaxation education;Encouraged to exercise with the program and follow exercise prescription    Comments Dorri is returning to cardiac rehab due to NSTEMI in August and continued, unrelieved chest pain. She expresses frustration that she is unable to acheive adequate pain control  with Tylenol , which is what the doctors have encouraged her to use. She states that when she has chest pain, it also causes shortness of breath and left arm numbess and typically takes 15-20 minutes before she is able to return to what she was doing. She states these episodes regularly happen 5-6 times per day. Dawnita's current job is watching her grandchildren each week. She does state that she is in need of a right knee replacement, however she has not been able to have one due to her cardiac issues.    Expected Outcomes Short: Attend cardiac rehab for education and exercise. Long: Develop and maintain positive self care habits.    Continue Psychosocial Services  Follow up required by staff          Psychosocial Re-Evaluation:  Psychosocial Re-Evaluation     Row Name 06/07/24 0806 06/19/24 9188           Psychosocial Re-Evaluation   Current issues with -- Current Stress Concerns      Comments .Reviewed patient health questionnaire (PHQ-9) with patient for follow up. Previously, patients score indicated signs/symptoms of depression.  Reviewed to see if patient is improving  symptom wise while in program.  Score improved and patient states that it is because she has been more involed in her church group which has helped her mood. Pt states that the only major stressor for her is her heart health. She reports that her angina causes her pain every day. For stress relief she enjoys spending time with her grandkids, playing crossword puzzles on her phone, and exercising. She reports that she is not sleeping well, as she only gets about 2-3 hours a night. She states that she sleeps sitting up because her chest hurts when she lays all the way down. She has anxiety about her health that keeps her from sleeping most nights. She has sleeping pills but reports that they are not effective for her. She sees her doctor today and plans to ask if there is anything else she could try to help with her sleep  concerns.      Expected Outcomes Short: Continue to attend [facility] regularly for regular exercise and social engagement. Long: Continue to improve symptoms and manage a positive mental state. Short: Speak to doctor about sleep concerns. Long: Continue to improve symptoms and manage a positive mental state.      Interventions Encouraged to attend Cardiac Rehabilitation for the exercise Encouraged to attend Cardiac Rehabilitation for the exercise      Continue Psychosocial Services  Follow up required by staff Follow up required by staff         Psychosocial Discharge (Final Psychosocial Re-Evaluation):  Psychosocial Re-Evaluation - 06/19/24 0811       Psychosocial Re-Evaluation   Current issues with Current Stress Concerns    Comments Pt states that the only major stressor for her is her heart health. She reports that her angina causes her pain every day. For stress relief she enjoys spending time with her grandkids, playing crossword puzzles on her phone, and exercising. She reports that she is not sleeping well, as she only gets about 2-3 hours a night. She states that she sleeps sitting up because her chest hurts when she lays all the way down. She has anxiety about her health that keeps her from sleeping most nights. She has sleeping pills but reports that they are not effective for her. She sees her doctor today and plans to ask if there is anything else she could try to help with her sleep concerns.    Expected Outcomes Short: Speak to doctor about sleep concerns. Long: Continue to improve symptoms and manage a positive mental state.    Interventions Encouraged to attend Cardiac Rehabilitation for the exercise    Continue Psychosocial Services  Follow up required by staff          Vocational Rehabilitation: Provide vocational rehab assistance to qualifying candidates.   Vocational Rehab Evaluation & Intervention:  Vocational Rehab - 04/26/24 1408       Initial Vocational Rehab  Evaluation & Intervention   Assessment shows need for Vocational Rehabilitation No      Vocational Rehab Re-Evaulation   Comments no request for VR          Education: Education Goals: Education classes will be provided on a variety of topics geared toward better understanding of heart health and risk factor modification. Participant will state understanding/return demonstration of topics presented as noted by education test scores.  Learning Barriers/Preferences:  Learning Barriers/Preferences - 04/26/24 1408       Learning Barriers/Preferences   Learning Barriers Sight    Learning Preferences Individual Instruction;Verbal Instruction  General Cardiac Education Topics:  AED/CPR: - Group verbal and written instruction with the use of models to demonstrate the basic use of the AED with the basic ABC's of resuscitation.   Test and Procedures: - Group verbal and visual presentation and models provide information about basic cardiac anatomy and function. Reviews the testing methods done to diagnose heart disease and the outcomes of the test results. Describes the treatment choices: Medical Management, Angioplasty, or Coronary Bypass Surgery for treating various heart conditions including Myocardial Infarction, Angina, Valve Disease, and Cardiac Arrhythmias. Written material provided at class time. Flowsheet Row Cardiac Rehab from 06/10/2022 in [facility] Cardiac and Pulmonary Rehab  Education need identified 02/08/22  Date 06/10/22  Educator sb  Instruction Review Code 1- Verbalizes Understanding    Medication Safety: - Group verbal and visual instruction to review commonly prescribed medications for heart and lung disease. Reviews the medication, class of the drug, and side effects. Includes the steps to properly store meds and maintain the prescription regimen. Written material provided at class time.   Intimacy: - Group verbal instruction through game format to discuss  how heart and lung disease can affect sexual intimacy. Written material provided at class time.   Know Your Numbers and Heart Failure: - Group verbal and visual instruction to discuss disease risk factors for cardiac and pulmonary disease and treatment options.  Reviews associated critical values for Overweight/Obesity, Hypertension, Cholesterol, and Diabetes.  Discusses basics of heart failure: signs/symptoms and treatments.  Introduces Heart Failure Zone chart for action plan for heart failure. Written material provided at class time.   Infection Prevention: - Provides verbal and written material to individual with discussion of infection control including proper hand washing and proper equipment cleaning during exercise session. Flowsheet Row Cardiac Rehab from 05/02/2024 in [facility] Cardiac and Pulmonary Rehab  Date 05/02/24  Educator MB  Instruction Review Code 1- Verbalizes Understanding    Falls Prevention: - Provides verbal and written material to individual with discussion of falls prevention and safety. Flowsheet Row Cardiac Rehab from 05/02/2024 in [facility] Cardiac and Pulmonary Rehab  Date 05/02/24  Educator MB  Instruction Review Code 1- Verbalizes Understanding    Other: -Provides group and verbal instruction on various topics (see comments)   Knowledge Questionnaire Score:  Knowledge Questionnaire Score - 05/15/24 0735       Knowledge Questionnaire Score   Pre Score 22-26          Core Components/Risk Factors/Patient Goals at Admission:  Personal Goals and Risk Factors at Admission - 05/02/24 1550       Core Components/Risk Factors/Patient Goals on Admission    Weight Management Yes;Weight Loss    Intervention Weight Management: Develop a combined nutrition and exercise program designed to reach desired caloric intake, while maintaining appropriate intake of nutrient and fiber, sodium and fats, and appropriate energy expenditure required for the weight goal.;Weight  Management: Provide education and appropriate resources to help participant work on and attain dietary goals.;Weight Management/Obesity: Establish reasonable short term and long term weight goals.;Obesity: Provide education and appropriate resources to help participant work on and attain dietary goals.    Admit Weight 243 lb 11.2 oz (110.5 kg)    Goal Weight: Short Term 200 lb (90.7 kg)    Goal Weight: Long Term 180 lb (81.6 kg)    Expected Outcomes Short Term: Continue to assess and modify interventions until short term weight is achieved;Long Term: Adherence to nutrition and physical activity/exercise program aimed toward attainment of established weight goal;Weight  Loss: Understanding of general recommendations for a balanced deficit meal plan, which promotes 1-2 lb weight loss per week and includes a negative energy balance of [phone] kcal/d;Understanding recommendations for meals to include 15-35% energy as protein, 25-35% energy from fat, 35-60% energy from carbohydrates, less than 200mg  of dietary cholesterol, 20-35 gm of total fiber daily;Understanding of distribution of calorie intake throughout the day with the consumption of 4-5 meals/snacks    Diabetes Yes    Intervention Provide education about signs/symptoms and action to take for hypo/hyperglycemia.;Provide education about proper nutrition, including hydration, and aerobic/resistive exercise prescription along with prescribed medications to achieve blood glucose in normal ranges: Fasting glucose 65-99 mg/dL    Expected Outcomes Short Term: Participant verbalizes understanding of the signs/symptoms and immediate care of hyper/hypoglycemia, proper foot care and importance of medication, aerobic/resistive exercise and nutrition plan for blood glucose control.;Long Term: Attainment of HbA1C < 7%.    Hypertension Yes    Intervention Provide education on lifestyle modifcations including regular physical activity/exercise, weight management,  moderate sodium restriction and increased consumption of fresh fruit, vegetables, and low fat dairy, alcohol moderation, and smoking cessation.;Monitor prescription use compliance.    Expected Outcomes Short Term: Continued assessment and intervention until BP is < 140/39mm HG in hypertensive participants. < 130/33mm HG in hypertensive participants with diabetes, heart failure or chronic kidney disease.;Long Term: Maintenance of blood pressure at goal levels.    Lipids Yes    Intervention Provide education and support for participant on nutrition & aerobic/resistive exercise along with prescribed medications to achieve LDL 70mg , HDL >40mg .    Expected Outcomes Short Term: Participant states understanding of desired cholesterol values and is compliant with medications prescribed. Participant is following exercise prescription and nutrition guidelines.;Long Term: Cholesterol controlled with medications as prescribed, with individualized exercise RX and with personalized nutrition plan. Value goals: LDL < 70mg , HDL > 40 mg.    Stress Yes    Intervention Offer individual and/or small group education and counseling on adjustment to heart disease, stress management and health-related lifestyle change. Teach and support self-help strategies.;Refer participants experiencing significant psychosocial distress to appropriate mental health specialists for further evaluation and treatment. When possible, include family members and significant others in education/counseling sessions.    Expected Outcomes Short Term: Participant demonstrates changes in health-related behavior, relaxation and other stress management skills, ability to obtain effective social support, and compliance with psychotropic medications if prescribed.;Long Term: Emotional wellbeing is indicated by absence of clinically significant psychosocial distress or social isolation.          Education:Diabetes - Individual verbal and written  instruction to review signs/symptoms of diabetes, desired ranges of glucose level fasting, after meals and with exercise. Acknowledge that pre and post exercise glucose checks will be done for 3 sessions at entry of program. Flowsheet Row Cardiac Rehab from 05/02/2024 in [facility] Cardiac and Pulmonary Rehab  Date 05/02/24  Educator MB  Instruction Review Code 1- Verbalizes Understanding    Core Components/Risk Factors/Patient Goals Review:   Goals and Risk Factor Review     Row Name 06/07/24 0759 06/19/24 0807           Core Components/Risk Factors/Patient Goals Review   Personal Goals Review Weight Management/Obesity;Hypertension Weight Management/Obesity;Hypertension;Diabetes      Review Korrie states that she is betting a blood pressure cuff to check her BP at home. Infomred her how to use her blood pressure cuff and to obtain readings and compair to what we get in Rehab. Electa states that  she would like to lose about 15 lbs so that she can qualify for a heart transplant. She has been taking mounjaro  shots and exercising in rehab and at home to work towards weight loss. She is checking her BP daily and reports that it has stayed within normal ranges. She is also checking her blood glucose levels twice a day, before breakfast and before bed, and reports that her levels have been well controlled. She reports taking all of her medications as prescribed as well.      Expected Outcomes Short: Start using blood pressure cuff when obtained. Long: maintain blood pressure readings at home. Short: Continue to work towards weight loss through exercise. Long: Continue to manage lifestyle risk factors.         Core Components/Risk Factors/Patient Goals at Discharge (Final Review):   Goals and Risk Factor Review - 06/19/24 0807       Core Components/Risk Factors/Patient Goals Review   Personal Goals Review Weight Management/Obesity;Hypertension;Diabetes    Review Lavanna states that she would like to  lose about 15 lbs so that she can qualify for a heart transplant. She has been taking mounjaro  shots and exercising in rehab and at home to work towards weight loss. She is checking her BP daily and reports that it has stayed within normal ranges. She is also checking her blood glucose levels twice a day, before breakfast and before bed, and reports that her levels have been well controlled. She reports taking all of her medications as prescribed as well.    Expected Outcomes Short: Continue to work towards weight loss through exercise. Long: Continue to manage lifestyle risk factors.          ITP Comments:  ITP Comments     Row Name 04/26/24 1347 05/02/24 1540 05/15/24 0732 05/23/24 1007 06/20/24 1102   ITP Comments Initial phone call completed. Diagnosis can be found in [facility] 10/13. EP Orientation scheduled for 05/02/24 at 1:30 pm. Completed and gym orientation for cardiac rehab. Initial ITP created and sent for review to Dr. Oneil Pinal, Medical Director. First full day of exercise!  Patient was oriented to gym and equipment including functions, settings, policies, and procedures.  Patient's individual exercise prescription and treatment plan were reviewed.  All starting workloads were established based on the results of the 6 minute walk test done at initial orientation visit.  The plan for exercise progression was also introduced and progression will be customized based on patient's performance and goals. 30 Day review completed. Medical Director ITP review done, changes made as directed, and signed approval by Medical Director.  New to program. 30 Day review completed. Medical Director ITP review done, changes made as directed, and signed approval by Medical Director.      Comments: 30 day review     [1]  Current Outpatient Medications:    amLODipine  (NORVASC ) 5 MG tablet, Take 1 tablet (5 mg total) by mouth daily., Disp: 90 tablet, Rfl: 1   aspirin  EC 81 MG tablet, Take 81 mg by mouth  daily., Disp: , Rfl:    atorvastatin  (LIPITOR ) 40 MG tablet, Take 2 tablets (80 mg total) by mouth daily., Disp: 90 tablet, Rfl: 3   Blood Glucose Monitoring Suppl (ONETOUCH VERIO FLEX SYSTEM) w/Device KIT, Use as directed twice a daily DX E11.65, Disp: 1 kit, Rfl: 0   Budeson-Glycopyrrol-Formoterol  (BREZTRI  AEROSPHERE) 160-9-4.8 MCG/ACT AERO, Inhale 2 puffs into the lungs 2 (two) times daily., Disp: 10.7 g, Rfl: 11   clopidogrel  (PLAVIX )  75 MG tablet, Take 1 tablet (75 mg total) by mouth daily., Disp: 90 tablet, Rfl: 1   colchicine  0.6 MG tablet, Take 1 tablet (0.6 mg total) by mouth 2 (two) times daily for 14 days., Disp: 28 tablet, Rfl: 0   empagliflozin  (JARDIANCE ) 25 MG TABS tablet, Take one tab a day for diabetes, Disp: 90 tablet, Rfl: 3   escitalopram  (LEXAPRO ) 10 MG tablet, Take 1 tablet (10 mg total) by mouth daily., Disp: 30 tablet, Rfl: 2   Evolocumab  (REPATHA  SURECLICK) 140 MG/ML SOAJ, Inject 140 mg into the skin every 14 (fourteen) days., Disp: 2 mL, Rfl: 2   ezetimibe  (ZETIA ) 10 MG tablet, Take 1 tablet (10 mg total) by mouth daily., Disp: 90 tablet, Rfl: 1   famotidine  (PEPCID ) 20 MG tablet, Take 1 tablet (20 mg total) by mouth 2 (two) times daily., Disp: 90 tablet, Rfl: 1   furosemide  (LASIX ) 40 MG tablet, Take 1 tablet (40 mg total) by mouth 2 (two) times daily., Disp: 180 tablet, Rfl: 1   glucose blood (ONETOUCH VERIO) test strip, Use as instructed twice daily DX E11.65, Disp: 100 each, Rfl: 3   hydrALAZINE  (APRESOLINE ) 10 MG tablet, Take 1 tablet (10 mg total) by mouth 3 (three) times daily., Disp: 270 tablet, Rfl: 0   HYDROcodone -acetaminophen  (NORCO/VICODIN) 5-325 MG tablet, Take 1 tablet by mouth daily as needed for severe pain, Disp: 30 tablet, Rfl: 0   isosorbide  mononitrate (IMDUR ) 120 MG 24 hr tablet, Take 1 tablet (120 mg total) by mouth 2 (two) times daily., Disp: 60 tablet, Rfl: 1   losartan  (COZAAR ) 100 MG tablet, Take 0.5 tablets (50 mg total) by mouth daily., Disp: 90  tablet, Rfl: 3   metoprolol  succinate (TOPROL -XL) 100 MG 24 hr tablet, Take 1 tablet (100 mg total) by mouth daily. (Patient taking differently: Take 100 mg by mouth 2 (two) times daily.), Disp: 30 tablet, Rfl: 1   Misc. Devices (ADJUST BATH/SHOWER SEAT) MISC, 1 shower seat for use at home to reduce risk of falling., Disp: 1 each, Rfl: 0   montelukast  (SINGULAIR ) 10 MG tablet, Take 1 tablet (10 mg total) by mouth at bedtime., Disp: 90 tablet, Rfl: 1   nitroGLYCERIN  (NITROSTAT ) 0.4 MG SL tablet, DISSOLVE ONE TABLET UNDER THE TONGUE EVERY 5 MINUTES AS NEEDED FOR CHEST PAIN.  DO NOT EXCEED A TOTAL OF 3 DOSES IN 15 MINUTES (Patient taking differently: Place 0.4 mg under the tongue every 5 (five) minutes as needed for chest pain.), Disp: 30 tablet, Rfl: 3   OneTouch Delica Lancets 30G MISC, USE 1  TO CHECK GLUCOSE TWICE DAILY AS DIRECTED, Disp: 100 each, Rfl: 0   oxybutynin  (DITROPAN ) 5 MG tablet, Take 1 tablet (5 mg total) by mouth daily., Disp: 90 tablet, Rfl: 1   PROAIR  RESPICLICK 108 (90 Base) MCG/ACT AEPB, Inhale 2 puffs into the lungs every 6 (six) hours as needed., Disp: 3 each, Rfl: 1   ranolazine  (RANEXA ) 1000 MG SR tablet, Take 1 tablet (1,000 mg total) by mouth 2 (two) times daily., Disp: 60 tablet, Rfl: 2   tirzepatide  (MOUNJARO ) 5 MG/0.5ML Pen, Inject 5 mg into the skin once a week., Disp: 2 mL, Rfl: 2   zolpidem  (AMBIEN ) 5 MG tablet, Take 1 tablet (5 mg total) by mouth at bedtime as needed. for sleep, Disp: 30 tablet, Rfl: 2 [2]  Social History Tobacco Use  Smoking Status Never  Smokeless Tobacco Never

## 2024-06-21 ENCOUNTER — Encounter

## 2024-06-21 DIAGNOSIS — I214 Non-ST elevation (NSTEMI) myocardial infarction: Secondary | ICD-10-CM

## 2024-06-21 NOTE — Progress Notes (Signed)
 Daily Session Note  Patient Details  Name: Felicia Woods MRN: [id number] Date of Birth: 02-May-1956 Referring Provider:   Flowsheet Row Cardiac Rehab from 05/02/2024 in [facility] Cardiac and Pulmonary Rehab  Referring Provider Florencio Kava, MD    Encounter Date: 06/21/2024  Check In:  Session Check In - 06/21/24 0753       Check-In   Supervising physician immediately available to respond to emergencies See telemetry face sheet for immediately available ER MD    Location [facility]-Cardiac & Pulmonary Rehab    Staff Present Leita Franks RN,BSN;Joseph Rolinda RCP,RRT,BSRT;Jason Elnor RDN,LDN    Virtual Visit No    Medication changes reported     No    Fall or balance concerns reported    No    Tobacco Cessation No Change    Warm-up and Cool-down Performed on first and last piece of equipment    Resistance Training Performed Yes    VAD Patient? No    PAD/SET Patient? No      Pain Assessment   Currently in Pain? No/denies             Tobacco Use History[1]  Goals Met:  Independence with exercise equipment Exercise tolerated well No report of concerns or symptoms today Strength training completed today  Goals Unmet:  Not Applicable  Comments: Pt able to follow exercise prescription today without complaint.  Will continue to monitor for progression.    Dr. Oneil Pinal is Medical Director for [facility] Cardiac Rehabilitation.  Dr. Fuad Aleskerov is Medical Director for [facility] Pulmonary Rehabilitation.    [1]  Social History Tobacco Use  Smoking Status Never  Smokeless Tobacco Never

## 2024-06-22 ENCOUNTER — Encounter

## 2024-06-25 ENCOUNTER — Encounter

## 2024-06-26 ENCOUNTER — Encounter

## 2024-06-27 ENCOUNTER — Encounter

## 2024-06-27 DIAGNOSIS — I214 Non-ST elevation (NSTEMI) myocardial infarction: Secondary | ICD-10-CM | POA: Diagnosis not present

## 2024-06-27 NOTE — Progress Notes (Signed)
 Daily Session Note  Patient Details  Name: Felicia Woods MRN: [id number] Date of Birth: Nov 13, 1955 Referring Provider:   Flowsheet Row Cardiac Rehab from 05/02/2024 in [facility] Cardiac and Pulmonary Rehab  Referring Provider Florencio Kava, MD    Encounter Date: 06/27/2024  Check In:  Session Check In - 06/27/24 0719       Check-In   Supervising physician immediately available to respond to emergencies See telemetry face sheet for immediately available ER MD    Location [facility]-Cardiac & Pulmonary Rehab    Staff Present Burnard Davenport RN,BSN,MPA;Joseph Rolinda RCP,RRT,BSRT;Noah Tickle, [place], Exercise Physiologist    Virtual Visit No    Medication changes reported     No    Fall or balance concerns reported    No    Tobacco Cessation No Change    Warm-up and Cool-down Performed on first and last piece of equipment    Resistance Training Performed Yes    VAD Patient? No    PAD/SET Patient? No      Pain Assessment   Currently in Pain? No/denies             Tobacco Use History[1]  Goals Met:  Independence with exercise equipment Exercise tolerated well No report of concerns or symptoms today Strength training completed today  Goals Unmet:  Not Applicable  Comments: Pt able to follow exercise prescription today without complaint.  Will continue to monitor for progression.    Dr. Oneil Pinal is Medical Director for [facility] Cardiac Rehabilitation.  Dr. Fuad Aleskerov is Medical Director for [facility] Pulmonary Rehabilitation.    [1]  Social History Tobacco Use  Smoking Status Never  Smokeless Tobacco Never

## 2024-07-02 ENCOUNTER — Encounter

## 2024-07-02 ENCOUNTER — Other Ambulatory Visit: Payer: Self-pay

## 2024-07-02 ENCOUNTER — Emergency Department

## 2024-07-02 ENCOUNTER — Emergency Department
Admission: EM | Admit: 2024-07-02 | Discharge: 2024-07-02 | Disposition: A | Discharge status: Home / Self Care | Attending: Emergency Medicine | Admitting: Emergency Medicine

## 2024-07-02 DIAGNOSIS — I10 Essential (primary) hypertension: Secondary | ICD-10-CM | POA: Diagnosis not present

## 2024-07-02 DIAGNOSIS — C7960 Secondary malignant neoplasm of unspecified ovary: Secondary | ICD-10-CM | POA: Insufficient documentation

## 2024-07-02 DIAGNOSIS — Z951 Presence of aortocoronary bypass graft: Secondary | ICD-10-CM | POA: Insufficient documentation

## 2024-07-02 DIAGNOSIS — R079 Chest pain, unspecified: Secondary | ICD-10-CM | POA: Diagnosis present

## 2024-07-02 DIAGNOSIS — R600 Localized edema: Secondary | ICD-10-CM | POA: Diagnosis not present

## 2024-07-02 DIAGNOSIS — I2510 Atherosclerotic heart disease of native coronary artery without angina pectoris: Secondary | ICD-10-CM | POA: Insufficient documentation

## 2024-07-02 DIAGNOSIS — E119 Type 2 diabetes mellitus without complications: Secondary | ICD-10-CM | POA: Insufficient documentation

## 2024-07-02 DIAGNOSIS — J45909 Unspecified asthma, uncomplicated: Secondary | ICD-10-CM | POA: Insufficient documentation

## 2024-07-02 LAB — CBC
HCT: 42.7 % (ref 36.0–46.0)
Hemoglobin: 13.2 g/dL (ref 12.0–15.0)
MCH: 25.3 pg — ABNORMAL LOW (ref 26.0–34.0)
MCHC: 30.9 g/dL (ref 30.0–36.0)
MCV: 82 fL (ref 80.0–100.0)
Platelets: 260 K/uL (ref 150–400)
RBC: 5.21 MIL/uL — ABNORMAL HIGH (ref 3.87–5.11)
RDW: 17 % — ABNORMAL HIGH (ref 11.5–15.5)
WBC: 9.8 K/uL (ref 4.0–10.5)
nRBC: 0 % (ref 0.0–0.2)

## 2024-07-02 LAB — COMPREHENSIVE METABOLIC PANEL WITH GFR
ALT: 15 U/L (ref 0–44)
AST: 22 U/L (ref 15–41)
Albumin: 4.1 g/dL (ref 3.5–5.0)
Alkaline Phosphatase: 86 U/L (ref 38–126)
Anion gap: 13 (ref 5–15)
BUN: 19 mg/dL (ref 8–23)
CO2: 26 mmol/L (ref 22–32)
Calcium: 9.6 mg/dL (ref 8.9–10.3)
Chloride: 100 mmol/L (ref 98–111)
Creatinine, Ser: 0.8 mg/dL (ref 0.44–1.00)
GFR, Estimated: >60 mL/min
Glucose, Bld: 176 mg/dL — ABNORMAL HIGH (ref 70–99)
Potassium: 3.9 mmol/L (ref 3.5–5.1)
Sodium: 138 mmol/L (ref 135–145)
Total Bilirubin: 0.6 mg/dL (ref 0.0–1.2)
Total Protein: 7.8 g/dL (ref 6.5–8.1)

## 2024-07-02 LAB — PRO BRAIN NATRIURETIC PEPTIDE: Pro Brain Natriuretic Peptide: 179 pg/mL

## 2024-07-02 LAB — TROPONIN T, HIGH SENSITIVITY
Troponin T High Sensitivity: <15 ng/L (ref 0–19)
Troponin T High Sensitivity: <15 ng/L (ref 0–19)

## 2024-07-02 MED ORDER — NITROGLYCERIN 0.4 MG SL SUBL: 0.4000 mg | SUBLINGUAL_TABLET | SUBLINGUAL | Status: DC | PRN

## 2024-07-02 MED ORDER — GABAPENTIN 100 MG PO CAPS
100.0000 mg | ORAL_CAPSULE | Freq: Once | ORAL | Status: AC
  Administered 2024-07-02: 100 mg via ORAL
  Filled 2024-07-02: qty 1

## 2024-07-02 MED ORDER — GABAPENTIN 100 MG PO CAPS: 100.0000 mg | ORAL_CAPSULE | Freq: Three times a day (TID) | ORAL | 0 refills | Status: AC | PRN

## 2024-07-02 MED ORDER — MORPHINE SULFATE (PF) 4 MG/ML IV SOLN
4.0000 mg | Freq: Once | INTRAVENOUS | Status: AC
  Administered 2024-07-02: 4 mg via INTRAVENOUS
  Filled 2024-07-02: qty 1

## 2024-07-02 MED ORDER — ONDANSETRON HCL 4 MG/2ML IJ SOLN
4.0000 mg | Freq: Once | INTRAMUSCULAR | Status: AC
  Administered 2024-07-02: 4 mg via INTRAVENOUS
  Filled 2024-07-02: qty 2

## 2024-07-02 MED ORDER — CYCLOBENZAPRINE HCL 5 MG PO TABS: 5.0000 mg | ORAL_TABLET | Freq: Three times a day (TID) | ORAL | 0 refills | Status: AC | PRN

## 2024-07-02 MED ORDER — OXYCODONE-ACETAMINOPHEN 5-325 MG PO TABS
1.0000 | ORAL_TABLET | Freq: Once | ORAL | Status: AC
  Administered 2024-07-02: 1 via ORAL
  Filled 2024-07-02: qty 1

## 2024-07-02 MED ORDER — HYDROMORPHONE HCL 1 MG/ML IJ SOLN
0.5000 mg | Freq: Once | INTRAMUSCULAR | Status: AC
  Administered 2024-07-02: 0.5 mg via INTRAVENOUS
  Filled 2024-07-02: qty 0.5

## 2024-07-02 MED ORDER — OXYCODONE-ACETAMINOPHEN 5-325 MG PO TABS: 1.0000 | ORAL_TABLET | ORAL | 0 refills | Status: AC | PRN

## 2024-07-02 MED ORDER — AMLODIPINE BESYLATE 5 MG PO TABS: 10.0000 mg | ORAL_TABLET | Freq: Every day | ORAL | Status: DC

## 2024-07-02 NOTE — ED Triage Notes (Signed)
 Pt comes in via [facility] with complaints of chest pain 10/10 at this time. Pt took 2 nitroglycerin , and 324 of aspirin  prior to EMS arrival. 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 Pt currently has nitroglycerin  paste on her chest applied by EMS.  Pt states that her sensation feels the same as that time. Pt has also has a history of CHF and diabetes. Pt is alert and oriented x4. ED Provider Mumma at bedside.

## 2024-07-02 NOTE — ED Provider Notes (Signed)
 Emergency department handoff note  Care of this patient was signed out to me at the end of the previous provider shift.  All pertinent patient information was conveyed and all questions were answered.  Patient pending cardiology evaluation.  Dr. Wilburn and PA Elveria Bloch, with cardiology, have seen and evaluated this patient however did not feel that they can offer any further evaluation or change in treatment plan for this patient.  Patient also had reproducible chest pain to palpation on their exam concerning for a musculoskeletal element of this pain.  Upon reexamination of this patient, she states that this pain always worsens when she is exerting herself however does have pain at baseline as well.  Patient states that she has not been using any medications so far for this pain however she has had oxycodone  in the past that has helped.  Patient states the worst pain keeps her from sleep at the end of the night and that is what she is most concerned about.  I discussed with patient the importance of follow-up with pain clinic however there she is very frustrated at the timeline required to schedule an appointment.  Patient has still not had a schedule appointment.  Therefore I will cover patient empirically with a short course of oxycodone  as well as Flexeril  and gabapentin  for continued pain control.  Patient was counseled on the risks of these medications and she expressed understanding.  Patient also given strict return precautions and all questions were answered prior to discharge.  Dispo: Discharge home with cardiology follow-up   Hindy Perrault K, MD 07/02/24 1642

## 2024-07-02 NOTE — ED Provider Notes (Signed)
 "  [facility] Provider Note    Event Date/Time   First MD Initiated Contact with Patient 07/02/24 1124     (approximate)   History   Chest Pain   HPI  Felicia Woods is a 68 y.o. female past medical history significant for CAD status post CABG, hypertension, hyperlipidemia, chronic recurrent angina, presents to the emergency department with chest pain.  Patient states that after she got up this morning she started having severe chest pain.  Rates her pain as 10 out of 10.  States that it feels similar to prior episodes of her chest pain that she has had with her coronary artery disease.  States that they were unable to do an intervention.  Took 324 mg of aspirin  and 2 nitroglycerin  prior to arrival.  Ongoing chest pain that has not improved.  Denies any significant shortness of breath.  No history of pulmonary embolism.  On chart review patient had right heart cath with PCI on 04/16/2024 but was unable to do any intervention at that time.  Recommended medical management.  Has been seen in the advanced heart failure clinic.  Followed by Dr. Hilarie with cardiology.  Recommended continuing with cardiac rehab.     Physical Exam   Triage Vital Signs: ED Triage Vitals  Encounter Vitals Group     BP      Girls Systolic BP Percentile      Girls Diastolic BP Percentile      Boys Systolic BP Percentile      Boys Diastolic BP Percentile      Pulse      Resp      Temp      Temp src      SpO2      Weight      Height      Head Circumference      Peak Flow      Pain Score      Pain Loc      Pain Education      Exclude from Growth Chart     Most recent vital signs: Vitals:   07/02/24 1230 07/02/24 1300  BP: (!) 110/96 107/67  Pulse:    Resp: 11 19  Temp:    SpO2:      Physical Exam Constitutional:      Appearance: She is well-developed.  HENT:     Head: Atraumatic.  Eyes:     Conjunctiva/sclera: Conjunctivae normal.  Cardiovascular:     Rate  and Rhythm: Rhythm irregular.     Heart sounds: Normal heart sounds.  Pulmonary:     Effort: No respiratory distress.  Abdominal:     General: There is no distension.     Palpations: Abdomen is soft.     Tenderness: There is no abdominal tenderness.  Musculoskeletal:        General: Normal range of motion.     Cervical back: Normal range of motion.     Right lower leg: Edema present.     Left lower leg: Edema present.     Comments: Mild edema to bilateral lower extremities  Skin:    General: Skin is warm.     Capillary Refill: Capillary refill takes less than 2 seconds.  Neurological:     General: No focal deficit present.     Mental Status: She is alert. Mental status is at baseline.     IMPRESSION / MDM / ASSESSMENT AND PLAN / ED COURSE  I reviewed the  triage vital signs and the nursing notes.  Differential diagnosis including ACS, pneumonia, pulmonary edema, acute on chronic heart failure exacerbation, anemia, pleurisy, pericarditis.  Low suspicion for dissection, no tearing chest pain and pulses are equal and symmetric.  On chart review recent cardiac catheterization and unable to do any intervention.  Not a good candidate for transplant.   EKG  I, Clotilda Punter, the attending physician, personally viewed and interpreted this ECG.  EKG showed normal sinus rhythm.  Presupper right bundle branch block.  Negative Sgarbossa's criteria.  Low voltage.  Mild ST depression to the septal leads.  Decreased voltage when compared to prior EKG.  Irregular rhythm but P waves present on cardiac telemetry.  While on cardiac telemetry.  RADIOLOGY I independently reviewed imaging, my interpretation of imaging: Chest x-ray with no focal findings consistent with pneumonia.  No widened mediastinum.  Sternotomy wires noted.  Read as right by basilar opacities favoring atelectasis.  LABS (all labs ordered are listed, but only abnormal results are displayed) Labs interpreted as -    Labs  Reviewed  CBC - Abnormal; Notable for the following components:      Result Value   RBC 5.21 (*)    MCH 25.3 (*)    RDW 17.0 (*)    All other components within normal limits  COMPREHENSIVE METABOLIC PANEL WITH GFR - Abnormal; Notable for the following components:   Glucose, Bld 176 (*)    All other components within normal limits  PRO BRAIN NATRIURETIC PEPTIDE  TROPONIN T, HIGH SENSITIVITY  TROPONIN T, HIGH SENSITIVITY     MDM  Patient already received aspirin  and nitroglycerin .  Ordered nitroglycerin  and morphine  for pain control.  No significant leukocytosis or anemia.  Creatinine appears to be at her baseline with no significant electrolyte abnormalities.  Normal LFTs and T. bili.  Troponin is undetectable and normal proBNP.   Clinical Course as of 07/02/24 1449  Mon Jul 02, 2024  1336 On reevaluation and continued to have significant chest pain.  Ordered another dose of morphine .  Ordered nitroglycerin . [SM]  1337 On reevaluation continue to have chest pain, initial troponin is undetectable.  Plan for serial troponin since her symptoms started just prior to arrival.  Will give another dose of pain medication.  Low suspicion for pulmonary embolism or dissection.  Abdomen is nontender, doubt referred pain from the abdomen. [SM]  1400 Repeat EKG obtained given irregular rhythm on cardiac telemetry and ongoing chest pain.  Continues to have right bundle branch block.  Multiple PVCs.  PACs present.  Does have P waves present.  No signs of atrial fibrillation or flutter.  Second troponin is undetectable. [SM]  1411 On reevaluation continues to complain of severe chest pain.  Rates it as 7 out of 8.  Serial troponins are negative, have a low suspicion for ACS.  High risk heart score with known coronary artery disease.  Consult hospitalist for further evaluation for high risk chest pain. [SM]  1434 Discussed with the hospitalist for admission.  Reaching out to cardiology to discuss  evaluation in the emergency department and questionable discharge home versus admission, given she has been medically optimized. Cardiology will come to evaluate the patient. [SM]    Clinical Course User Index [SM] Punter Clotilda, MD     PROCEDURES:  Critical Care performed: No  Procedures  Patient's presentation is most consistent with acute presentation with potential threat to life or bodily function.   MEDICATIONS ORDERED IN ED: Medications  nitroGLYCERIN  (NITROSTAT )  SL tablet 0.4 mg (has no administration in time range)  morphine  (PF) 4 MG/ML injection 4 mg (4 mg Intravenous Given 07/02/24 1148)  morphine  (PF) 4 MG/ML injection 4 mg (4 mg Intravenous Given 07/02/24 1302)  ondansetron  (ZOFRAN ) injection 4 mg (4 mg Intravenous Given 07/02/24 1302)  HYDROmorphone  (DILAUDID ) injection 0.5 mg (0.5 mg Intravenous Given 07/02/24 1344)    FINAL CLINICAL IMPRESSION(S) / ED DIAGNOSES   Final diagnoses:  Chest pain, unspecified type     Rx / DC Orders   ED Discharge Orders     None        Note:  This document was prepared using Dragon voice recognition software and may include unintentional dictation errors.   Suzanne Kirsch, MD 07/02/24 1413  "

## 2024-07-02 NOTE — Consult Note (Signed)
 " [name] CARDIOLOGY CONSULT NOTE       Patient ID: Felicia Woods MRN: [id number] DOB/AGE: February 21, 1956 68 y.o.  Admit date: 07/02/2024 Referring Physician Dr. Clotilda Punter Primary Physician McDonough, Tinnie POUR, PA-C  Primary Cardiologist Dr. Hilarie Reason for Consultation chest pain  HPI: Felicia Woods is a 68 y.o. female  with a past medical history of coronary artery disease s/p CABGx4, multiple stents, unsuccessful attempt at CTO PCI 04/2024 at [facility], chronic refractory angina, hypertension, hyperlipidemia, type 2 diabetes mellitus, chronic HFpEF, obesity, asthma, COPD who presented to the ED on 07/02/2024 for chest pain. Cardiology was consulted for further evaluation.   Patient presented after onset of chest pain earlier this morning. Workup in the ED notable for creatinine 0.8, potassium 3.9, hemoglobin 13.2, WBC 9.8. Troponins less than 15 x 2, BNP 179. EKG in the ED normal sinus rhythm rate 75 bpm, no acute ischemic changes.  Chest x-ray without acute abnormality.  At the time of evaluation this afternoon, patient is resting comfortably in hospital bed with husband at bedside.  We discussed her symptoms in further detail.  She endorses episode of chest discomfort that began earlier today after walking from her kitchen back to her living room.  States that this was severe and is overall improved now.  Describes this as pressure but also worse with palpation.  Denies any shortness of breath or palpitations.  States that nothing helps her pain.  Denies any recent episodes of swelling.  States that she has not had any episodes of chest pain since August aside from today.  States that she has reproducible pain on the left side of her chest which is severe.  Review of systems complete and found to be negative unless listed above    Past Medical History:  Diagnosis Date   Asthma    Coronary artery disease    Diabetes mellitus without complication (HCC)    Heart attack (HCC)     Hyperlipidemia    Hypertension    MI (myocardial infarction) (HCC)    Migraine headache with aura    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    Past Surgical History:  Procedure Laterality Date   ABDOMINAL HYSTERECTOMY     CARDIAC CATHETERIZATION     CARDIAC CATHETERIZATION N/A 10/18/2015   Procedure: Left Heart Cath and Cors/Grafts Angiography;  Surgeon: Dorn JINNY Lesches, MD;  Location: [facility] INVASIVE CV LAB;  Service: Cardiovascular;  Laterality: N/A;   CARDIAC CATHETERIZATION N/A 10/18/2015   Procedure: Coronary Stent Intervention;  Surgeon: Dorn JINNY Lesches, MD;  Location: [facility]VAS[facility] CV LAB;  Service: Cardiovascular;  Laterality: N/A;   CARDIAC SURGERY     CHOLECYSTECTOMY     COLONOSCOPY WITH PROPOFOL  N/A 12/02/2021   Procedure: COLONOSCOPY WITH PROPOFOL ;  Surgeon: Therisa Bi, MD;  Location: [facility] ENDOSCOPY;  Service: Gastroenterology;  Laterality: N/A;   CORONARY ANGIOPLASTY     CORONARY ARTERY BYPASS GRAFT     4 vessels - 2010   CORONARY STENT INTERVENTION N/A 02/16/2017   Procedure: CORONARY STENT INTERVENTION;  Surgeon: Florencio Cara BIRCH, MD;  Location: [facility] INVASIVE CV LAB;  Service: Cardiovascular;  Laterality: N/A;   CORONARY STENT INTERVENTION N/A 02/13/2019   Procedure: CORONARY STENT INTERVENTION;  Surgeon: Mady Bruckner, MD;  Location: [facility] INVASIVE CV LAB;  Service: Cardiovascular;  Laterality: N/A;  SVG to RCA   CORONARY STENT INTERVENTION Left 03/08/2019   Procedure: CORONARY STENT INTERVENTION;  Surgeon: Florencio Cara BIRCH, MD;  Location: [facility] INVASIVE CV LAB;  Service: Cardiovascular;  Laterality: Left;   CORONARY STENT INTERVENTION N/A 11/04/2020   Procedure: CORONARY STENT INTERVENTION;  Surgeon: Mady Bruckner, MD;  Location: [facility] INVASIVE CV LAB;  Service: Cardiovascular;  Laterality: N/A;   CORONARY STENT INTERVENTION N/A 12/24/2021   Procedure: CORONARY STENT INTERVENTION;  Surgeon: Lawyer Bernardino Cough, MD;  Location: [facility] INVASIVE CV LAB;  Service: Cardiovascular;   Laterality: N/A;   ESOPHAGOGASTRODUODENOSCOPY N/A 12/02/2021   Procedure: ESOPHAGOGASTRODUODENOSCOPY (EGD);  Surgeon: Therisa Bi, MD;  Location: [facility] ENDOSCOPY;  Service: Gastroenterology;  Laterality: N/A;   LEFT HEART CATH AND CORONARY ANGIOGRAPHY Left 02/16/2017   Procedure: LEFT HEART CATH AND CORONARY ANGIOGRAPHY;  Surgeon: Hester Wolm PARAS, MD;  Location: [facility] INVASIVE CV LAB;  Service: Cardiovascular;  Laterality: Left;   LEFT HEART CATH AND CORONARY ANGIOGRAPHY Left 12/24/2021   Procedure: LEFT HEART CATH AND CORONARY ANGIOGRAPHY;  Surgeon: Hester Wolm PARAS, MD;  Location: [facility] INVASIVE CV LAB;  Service: Cardiovascular;  Laterality: Left;   LEFT HEART CATH AND CORONARY ANGIOGRAPHY Left 06/17/2022   Procedure: LEFT HEART CATH AND CORONARY ANGIOGRAPHY;  Surgeon: Florencio Cara BIRCH, MD;  Location: [facility] INVASIVE CV LAB;  Service: Cardiovascular;  Laterality: Left;   LEFT HEART CATH AND CORS/GRAFTS ANGIOGRAPHY Left 11/23/2017   Procedure: LEFT HEART CATH AND CORS/GRAFTS ANGIOGRAPHY;  Surgeon: Hester Wolm PARAS, MD;  Location: [facility] INVASIVE CV LAB;  Service: Cardiovascular;  Laterality: Left;   LEFT HEART CATH AND CORS/GRAFTS ANGIOGRAPHY N/A 02/13/2019   Procedure: LEFT HEART CATH AND CORS/GRAFTS ANGIOGRAPHY;  Surgeon: Hester Wolm PARAS, MD;  Location: [facility] INVASIVE CV LAB;  Service: Cardiovascular;  Laterality: N/A;   LEFT HEART CATH AND CORS/GRAFTS ANGIOGRAPHY N/A 07/03/2019   Procedure: LEFT HEART CATH AND CORS/GRAFTS ANGIOGRAPHY;  Surgeon: Hester Wolm PARAS, MD;  Location: [facility] INVASIVE CV LAB;  Service: Cardiovascular;  Laterality: N/A;   LEFT HEART CATH AND CORS/GRAFTS ANGIOGRAPHY N/A 11/04/2020   Procedure: LEFT HEART CATH AND CORS/GRAFTS ANGIOGRAPHY;  Surgeon: Hester Wolm PARAS, MD;  Location: [facility] INVASIVE CV LAB;  Service: Cardiovascular;  Laterality: N/A;   LEFT HEART CATH AND CORS/GRAFTS ANGIOGRAPHY N/A 02/14/2024   Procedure: LEFT HEART CATH AND CORS/GRAFTS ANGIOGRAPHY;  Surgeon:  Ammon Blunt, MD;  Location: [facility] INVASIVE CV LAB;  Service: Cardiovascular;  Laterality: N/A;    (Not in a hospital admission)  Social History   Socioeconomic History   Marital status: Widowed    Spouse name: lynwood   Number of children: Not on file   Years of education: Not on file   Highest education level: Not on file  Occupational History   Not on file  Tobacco Use   Smoking status: Never   Smokeless tobacco: Never  Vaping Use   Vaping status: Never Used  Substance and Sexual Activity   Alcohol use: No    Alcohol/week: 0.0 standard drinks of alcohol   Drug use: No   Sexual activity: Yes  Other Topics Concern   Not on file  Social History Narrative   Not on file   Social Drivers of Health   Tobacco Use: Low Risk (07/02/2024)   Patient History    Smoking Tobacco Use: Never    Smokeless Tobacco Use: Never    Passive Exposure: Not on file  Financial Resource Strain: Low Risk  (01/27/2024)   Received from [facility]   Overall Financial Resource Strain (CARDIA)    Difficulty of Paying Living Expenses: Not very hard  Food Insecurity: No Food Insecurity (02/23/2024)   Epic  Worried About Programme Researcher, Broadcasting/film/video in the Last Year: Never true    Ran Out of Food in the Last Year: Never true  Recent Concern: Food Insecurity - Food Insecurity Present (01/27/2024)   Received from [facility]   Epic    Within the past 12 months, you worried that your food would run out before you got the money to buy more.: Sometimes true    Within the past 12 months, the food you bought just didn't last and you didn't have money to get more.: Sometimes true  Transportation Needs: No Transportation Needs (02/23/2024)   Epic    Lack of Transportation (Medical): No    Lack of Transportation (Non-Medical): No  Physical Activity: Not on file  Stress: Not on file  Social Connections: Socially Integrated (02/23/2024)   Social Connection and Isolation Panel     Frequency of Communication with Friends and Family: More than three times a week    Frequency of Social Gatherings with Friends and Family: Never    Attends Religious Services: More than 4 times per year    Active Member of Clubs or Organizations: Yes    Attends Banker Meetings: Never    Marital Status: Married  Catering Manager Violence: Not At Risk (02/23/2024)   Epic    Fear of Current or Ex-Partner: No    Emotionally Abused: No    Physically Abused: No    Sexually Abused: No  Depression (PHQ2-9): Low Risk (06/07/2024)   Depression (PHQ2-9)    PHQ-2 Score: 3  Recent Concern: Depression (PHQ2-9) - High Risk (05/02/2024)   Depression (PHQ2-9)    PHQ-2 Score: 12  Alcohol Screen: Not on file  Housing: High Risk (02/23/2024)   Epic    Unable to Pay for Housing in the Last Year: Yes    Number of Times Moved in the Last Year: 0    Homeless in the Last Year: No  Utilities: Not At Risk (02/23/2024)   Epic    Threatened with loss of utilities: No  Health Literacy: Not on file    Family History  Problem Relation Age of Onset   Diabetes Mother    Diabetes Father    Cancer Father    Diabetes Brother      Vitals:   07/02/24 1500 07/02/24 1530 07/02/24 1600 07/02/24 1612  BP: 100/73 (!) 92/44 (!) 104/53 (!) 104/53  Pulse:    72  Resp: (!) 23 15 19 17   Temp:    (!) 97.4 F (36.3 C)  TempSrc:    Oral  SpO2:    96%  Weight:      Height:        PHYSICAL EXAM General: Chronically ill-appearing elderly female, well nourished, in no acute distress. HEENT: Normocephalic and atraumatic. Neck: No JVD.  Lungs: Normal respiratory effort on room air. Clear bilaterally to auscultation. No wheezes, crackles, rhonchi.  Heart: HRRR. Normal S1 and S2 without gallops or murmurs.  Abdomen: Non-distended appearing.  Msk: Normal strength and tone for age.  Reproducible pain with palpation of left chest wall. Extremities: Warm and well perfused. No clubbing, cyanosis.  No edema.   Neuro: Alert and oriented X 3. Psych: Answers questions appropriately.   Labs: Basic Metabolic Panel: Recent Labs    07/02/24 1129  NA 138  K 3.9  CL 100  CO2 26  GLUCOSE 176*  BUN 19  CREATININE 0.80  CALCIUM  9.6   Liver Function Tests: Recent Labs  07/02/24 1129  AST 22  ALT 15  ALKPHOS 86  BILITOT 0.6  PROT 7.8  ALBUMIN 4.1   No results for input(s): LIPASE, AMYLASE in the last 72 hours. CBC: Recent Labs    07/02/24 1129  WBC 9.8  HGB 13.2  HCT 42.7  MCV 82.0  PLT 260   Cardiac Enzymes: No results for input(s): CKTOTAL, CKMB, CKMBINDEX, TROPONINIHS in the last 72 hours. BNP: No results for input(s): BNP in the last 72 hours. D-Dimer: No results for input(s): DDIMER in the last 72 hours. Hemoglobin A1C: No results for input(s): HGBA1C in the last 72 hours. Fasting Lipid Panel: No results for input(s): CHOL, HDL, LDLCALC, TRIG, CHOLHDL, LDLDIRECT in the last 72 hours. Thyroid  Function Tests: No results for input(s): TSH, T4TOTAL, T3FREE, THYROIDAB in the last 72 hours.  Invalid input(s): FREET3 Anemia Panel: No results for input(s): VITAMINB12, FOLATE, FERRITIN, TIBC, IRON, RETICCTPCT in the last 72 hours.   Radiology: DG Chest Portable 1 View Result Date: 07/02/2024 EXAM: 1 VIEW(S) XRAY OF THE CHEST 07/02/2024 11:48:00 AM COMPARISON: 02/22/2024. CLINICAL HISTORY: Chest pain. Prior CABG. FINDINGS: LUNGS AND PLEURA: Scarring in the lingula. Linear densities at the right lung base, favor atelectasis. No pleural effusion. No pneumothorax. HEART AND MEDIASTINUM: No acute abnormality of the cardiac and mediastinal silhouettes. BONES AND SOFT TISSUES: No acute osseous abnormality. IMPRESSION: 1. Right basilar opacities, favor atelectasis . 2. No active disease. Electronically signed by: Franky Crease MD 07/02/2024 01:31 PM EST RP Workstation: [id number]    ECHO 06/2024: NORMAL LEFT VENTRICULAR SYSTOLIC  FUNCTION WITH NO LVH  ESTIMATED EF: 55%  NORMAL LA PRESSURES WITH NORMAL DIASTOLIC FUNCTION  NORMAL RIGHT VENTRICULAR SYSTOLIC FUNCTION  VALVULAR REGURGITATION: No AR, TRIVIAL MR, MILD PR, MILD TR       TELEMETRY (personally reviewed): Sinus rhythm rate 60s  EKG (personally reviewed): Normal sinus rhythm at 75 bpm, no acute ischemic changes  Data reviewed by me 07/02/2024: last 24h vitals tele labs imaging I/O ED provider note, admission H&P  Active Problems:   * No active hospital problems. *    ASSESSMENT AND PLAN:  Felicia Woods is a 68 y.o. female  with a past medical history of coronary artery disease s/p CABGx4, multiple stents, unsuccessful attempt at CTO PCI 04/2024 at [facility], chronic refractory angina, hypertension, hyperlipidemia, type 2 diabetes mellitus, chronic HFpEF, obesity, asthma, COPD who presented to the ED on 07/02/2024 for chest pain. Cardiology was consulted for further evaluation.   # Refractory angina # Coronary artery disease s/p CABG x 4, multiple stents # Hypertension # Hyperlipidemia  Patient presented after episode of chest pain earlier today.  Described as pressure but also worse with palpation.  Troponins normal x 2.  EKG without acute ischemic changes. - Discussed with patient that based on her prior catheterization results there is no further option for revascularization after failed attempt in October of this year.  She expressed understanding.  Explained that our options are limited for further control of her chest discomfort.  She is awaiting an appointment with pain management after recently being referred outpatient. - Continue home amlodipine  10 mg daily, isosorbide  mononitrate 120 milligrams twice daily, Ranexa  1000 mg twice daily, metoprolol  succinate 100 mg twice daily. - Continue atorvastatin  40 mg daily, Zetia  10 mg daily, aspirin  81 mg daily, Plavix  75 mg daily. - Continue Jardiance  25 mg daily, losartan  100 mg daily, Lasix  40 mg daily.  This  patient's plan of care was discussed and created with Dr. Wilburn  and he is in agreement.  Signed: Danita Bloch, PA-C  07/02/2024, 4:44 PM [facility]      "

## 2024-07-03 ENCOUNTER — Encounter

## 2024-07-04 ENCOUNTER — Encounter

## 2024-07-06 ENCOUNTER — Encounter

## 2024-07-09 ENCOUNTER — Encounter

## 2024-07-10 ENCOUNTER — Encounter: Attending: Internal Medicine

## 2024-07-10 DIAGNOSIS — I214 Non-ST elevation (NSTEMI) myocardial infarction: Secondary | ICD-10-CM | POA: Insufficient documentation

## 2024-07-10 NOTE — Progress Notes (Signed)
 Daily Session Note  Patient Details  Name: Felicia Woods MRN: [id number] Date of Birth: 1955-11-16 Referring Provider:   Flowsheet Row Cardiac Rehab from 05/02/2024 in [facility] Cardiac and Pulmonary Rehab  Referring Provider Florencio Kava, MD    Encounter Date: 07/10/2024  Check In:  Session Check In - 07/10/24 0720       Check-In   Supervising physician immediately available to respond to emergencies See telemetry face sheet for immediately available ER MD    Location [facility]-Cardiac & Pulmonary Rehab    Staff Present Burnard Davenport RN,BSN,MPA;Margaret Best, MS, Exercise Physiologist;Noah Tickle, BS, Exercise Physiologist;Jason Elnor RDN,LDN    Virtual Visit No    Medication changes reported     Yes    Comments taking cyclobenzaprine  5mg  TID as needed; taking gabapentin  100mg  TID as needed    Fall or balance concerns reported    No    Tobacco Cessation No Change    Warm-up and Cool-down Performed on first and last piece of equipment    Resistance Training Performed Yes    VAD Patient? No    PAD/SET Patient? No      Pain Assessment   Currently in Pain? No/denies             Tobacco Use History[1]  Goals Met:  Independence with exercise equipment Exercise tolerated well Personal goals reviewed No report of concerns or symptoms today Strength training completed today  Goals Unmet:  Not Applicable  Comments: Pt able to follow exercise prescription today without complaint.  Will continue to monitor for progression.    Dr. Oneil Pinal is Medical Director for [facility] Cardiac Rehabilitation.  Dr. Fuad Aleskerov is Medical Director for [facility] Pulmonary Rehabilitation.     [1]  Social History Tobacco Use  Smoking Status Never  Smokeless Tobacco Never

## 2024-07-11 ENCOUNTER — Encounter

## 2024-07-12 ENCOUNTER — Encounter: Admitting: Emergency Medicine

## 2024-07-12 DIAGNOSIS — I214 Non-ST elevation (NSTEMI) myocardial infarction: Secondary | ICD-10-CM

## 2024-07-12 NOTE — Progress Notes (Signed)
 Daily Session Note  Patient Details  Name: Lewis Grivas MRN: [id number] Date of Birth: 04-04-1956 Referring Provider:   Flowsheet Row Cardiac Rehab from 05/02/2024 in [facility] Cardiac [facility] Pul[facility] Re[facility]  Referring Provider Florencio Kava, MD    Encounter Date: 07/12/2024  Check In:  Session Check In - 07/12/24 0736       Check-In   Supervising physician immediately available to respond to emergencies See telemetry face sheet for immediately available ER MD    Location [facility]-Cardiac & Pulmonary Rehab    Staff Present Leita Franks RN,BSN;Joseph [facility] [place], [place], Exercise Physiologist;Jason Elnor RDN,LDN    Virtual Visit No    Medication changes reported     No    Fall or balance concerns reported    No    Tobacco Cessation No Change    Warm-up and Cool-down Performed on first and last piece of equipment    Resistance Training Performed Yes    VAD Patient? No    PAD/SET Patient? No      Pain Assessment   Currently in Pain? No/denies             Tobacco Use History[1]  Goals Met:  Independence with exercise equipment Exercise tolerated well No report of concerns or symptoms today Strength training completed today  Goals Unmet:  Not Applicable  Comments: Pt able to follow exercise prescription today without complaint.  Will continue to monitor for progression.    Dr. Oneil Pinal is Medical Director for [facility] Cardiac Rehabilitation.  Dr. Fuad Aleskerov is Medical Director for [facility] Pulmonary Rehabilitation.    [1]  Social History Tobacco Use  Smoking Status Never  Smokeless Tobacco Never

## 2024-07-13 ENCOUNTER — Encounter

## 2024-07-16 ENCOUNTER — Encounter

## 2024-07-17 ENCOUNTER — Encounter

## 2024-07-17 DIAGNOSIS — I214 Non-ST elevation (NSTEMI) myocardial infarction: Secondary | ICD-10-CM

## 2024-07-17 NOTE — Progress Notes (Signed)
 Daily Session Note  Patient Details  Name: Felicia Woods MRN: [id number] Date of Birth: [birth year] Referring Provider:   Flowsheet Row Cardiac Rehab from 05/02/2024 in [facility] Cardiac and Pulmonary Rehab  Referring Provider Florencio Kava, MD    Encounter Date: 07/17/2024  Check In:  Session Check In - 07/17/24 0730       Check-In   Supervising physician immediately available to respond to emergencies See telemetry face sheet for immediately available ER MD    Location [facility]-Cardiac & Pulmonary Rehab    Staff Present Burnard Davenport RN,BSN,MPA;Maxon Conetta BS, Exercise Physiologist;Margaret Best, MS, Exercise Physiologist;Jason Elnor RDN,LDN    Virtual Visit No    Medication changes reported     No    Fall or balance concerns reported    No    Tobacco Cessation No Change    Warm-up and Cool-down Performed on first and last piece of equipment    Resistance Training Performed Yes    VAD Patient? No    PAD/SET Patient? No      Pain Assessment   Currently in Pain? No/denies             Tobacco Use History[1]  Goals Met:  Independence with exercise equipment Exercise tolerated well No report of concerns or symptoms today Strength training completed today  Goals Unmet:  Not Applicable  Comments: Pt able to follow exercise prescription today without complaint.  Will continue to monitor for progression.    Dr. Oneil Pinal is Medical Director for [facility] Cardiac Rehabilitation.  Dr. Fuad Aleskerov is Medical Director for [facility] Pulmonary Rehabilitation.    [1]  Social History Tobacco Use  Smoking Status Never  Smokeless Tobacco Never

## 2024-07-18 ENCOUNTER — Encounter

## 2024-07-18 ENCOUNTER — Encounter: Payer: Self-pay | Admitting: *Deleted

## 2024-07-18 DIAGNOSIS — I214 Non-ST elevation (NSTEMI) myocardial infarction: Secondary | ICD-10-CM

## 2024-07-18 NOTE — Progress Notes (Signed)
 Cardiac Individual Treatment Plan  Patient Details  Name: Felicia Woods MRN: [id number] Date of Birth: 24-Sep-1955 Referring Provider:   Flowsheet Row Cardiac Rehab from 05/02/2024 in [facility] Cardiac and Pulmonary Rehab  Referring Provider Florencio Kava, MD    Initial Encounter Date:  Flowsheet Row Cardiac Rehab from 05/02/2024 in [facility] Cardiac and Pulmonary Rehab  Date 05/02/24    Visit Diagnosis: NSTEMI (non-ST elevated myocardial infarction) [facility])  Patient's Home Medications on Admission: Current Medications[1]  Past Medical History: Past Medical History:  Diagnosis Date   Asthma    Coronary artery disease    Diabetes mellitus without complication (HCC)    Heart attack (HCC)    Hyperlipidemia    Hypertension    MI (myocardial infarction) (HCC)    Migraine headache with aura    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    Tobacco Use: Tobacco Use History[2]  Labs: Review Flowsheet  More data exists      Latest Ref Rng & Units 12/17/2022 06/01/2023 12/19/2023 02/10/2024 05/10/2024  Labs for ITP Cardiac and Pulmonary Rehab  Cholestrol 0 - 200 mg/dL - - - 796  -  LDL (calc) 0 - 99 mg/dL - - - 852  -  HDL-C >59 mg/dL - - - 42  -  Trlycerides <150 mg/dL - - - 70  -  Hemoglobin A1c 4.0 - 5.6 % 7.5  7.6  7.6  - 7.8      Exercise Target Goals: Exercise Program Goal: Individual exercise prescription set using results from initial 6 min walk test and THRR while considering  patients activity barriers and safety.   Exercise Prescription Goal: Initial exercise prescription builds to 30-45 minutes a day of aerobic activity, 2-3 days per week.  Home exercise guidelines will be given to patient during program as part of exercise prescription that the participant will acknowledge.   Education: Aerobic Exercise: - Group verbal and visual presentation on the components of exercise prescription. Introduces F.I.T.T principle from ACSM for exercise prescriptions.  Reviews F.I.T.T.  principles of aerobic exercise including progression. Written material provided at class time. Flowsheet Row Cardiac Rehab from 06/10/2022 in [facility] Cardiac and Pulmonary Rehab  Education need identified 02/08/22    Education: Resistance Exercise: - Group verbal and visual presentation on the components of exercise prescription. Introduces F.I.T.T principle from ACSM for exercise prescriptions  Reviews F.I.T.T. principles of resistance exercise including progression. Written material provided at class time.    Education: Exercise & Equipment Safety: - Individual verbal instruction and demonstration of equipment use and safety with use of the equipment. Flowsheet Row Cardiac Rehab from 05/02/2024 in [facility] Cardiac and Pulmonary Rehab  Date 05/02/24  Educator MB  Instruction Review Code 1- Verbalizes Understanding    Education: Exercise Physiology & General Exercise Guidelines: - Group verbal and written instruction with models to review the exercise physiology of the cardiovascular system and associated critical values. Provides general exercise guidelines with specific guidelines to those with heart or lung disease. Written material provided at class time.   Education: Flexibility, Balance, Mind/Body Relaxation: - Group verbal and visual presentation with interactive activity on the components of exercise prescription. Introduces F.I.T.T principle from ACSM for exercise prescriptions. Reviews F.I.T.T. principles of flexibility and balance exercise training including progression. Also discusses the mind body connection.  Reviews various relaxation techniques to help reduce and manage stress (i.e. Deep breathing, progressive muscle relaxation, and visualization). Balance handout provided to take home. Written material provided at class time.   Activity Barriers &  Risk Stratification:  Activity Barriers & Cardiac Risk Stratification - 05/02/24 1544       Activity Barriers & Cardiac Risk  Stratification   Activity Barriers Joint Problems;Chest Pain/Angina   right knee pain   Cardiac Risk Stratification High          6 Minute Walk:  6 Minute Walk     Row Name 05/02/24 1540         6 Minute Walk   Phase Initial     Distance 370 feet     Walk Time 3 minutes     # of Rest Breaks 1  2nd half of test     MPH 1.4     METS 1.16     RPE 13     Perceived Dyspnea  2     VO2 Peak 4.07     Symptoms Yes (comment)     Comments Chest pain 10/10 during walk at the 3 minute mark. Took 1 Nitroglycerin  and relieved with rest and did not continue walking     Resting HR 86 bpm     Resting BP 126/70     Resting Oxygen  Saturation  99 %     Exercise Oxygen  Saturation  during 6 min walk 94 %     Max Ex. HR 142 bpm     Max Ex. BP 130/84     2 Minute Post BP 110/82        Oxygen  Initial Assessment:   Oxygen  Re-Evaluation:   Oxygen  Discharge (Final Oxygen  Re-Evaluation):   Initial Exercise Prescription:  Initial Exercise Prescription - 05/02/24 1500       Date of Initial Exercise RX and Referring Provider   Date 05/02/24    Referring Provider Florencio Kava, MD      Oxygen    Maintain Oxygen  Saturation 88% or higher      Recumbant Bike   Level 1    RPM 50    Watts 15    Minutes 15    METs 1.16      NuStep   Level 1    SPM 80    Minutes 15    METs 1.16      T5 Nustep   Level 1    SPM 80    Minutes 15    METs 1.16      Biostep-RELP   Level 1    SPM 50    Minutes 15    METs 1.16      Prescription Details   Frequency (times per week) 3    Duration Progress to 30 minutes of continuous aerobic without signs/symptoms of physical distress      Intensity   THRR 40-80% of Max Heartrate 112-139    Ratings of Perceived Exertion 11-13    Perceived Dyspnea 0-4      Progression   Progression Continue to progress workloads to maintain intensity without signs/symptoms of physical distress.      Resistance Training   Training Prescription Yes     Weight 4 lb    Reps 10-15          Perform Capillary Blood Glucose checks as needed.  Exercise Prescription Changes:   Exercise Prescription Changes     Row Name 05/02/24 1500 05/23/24 1100 06/07/24 1100 06/21/24 1700 07/02/24 1500     Response to Exercise   Blood Pressure (Admit) 126/70 110/64 118/62 104/60 102/60   Blood Pressure (Exercise) 130/84 128/78 130/70 124/60 138/60   Blood Pressure (Exit) 110/82  120/64 102/62 102/60 108/64   Heart Rate (Admit) 86 bpm 75 bpm 60 bpm 73 bpm 73 bpm   Heart Rate (Exercise) 142 bpm 86 bpm 86 bpm 99 bpm 91 bpm   Heart Rate (Exit) 97 bpm 65 bpm 70 bpm 81 bpm 86 bpm   Oxygen  Saturation (Admit) 99 % -- -- -- --   Oxygen  Saturation (Exercise) 94 % -- -- -- --   Oxygen  Saturation (Exit) 96 % -- -- -- --   Rating of Perceived Exertion (Exercise) 13 15 13 12 13    Perceived Dyspnea (Exercise) 2 -- -- -- --   Symptoms Chest pain 10/10, took 1 nitroglycerin  at the 3 minute mark, relieved with rest, did not continue walking none none none none   Comments results first 2 weeks of exercise -- -- --   Duration -- Progress to 30 minutes of  aerobic without signs/symptoms of physical distress Continue with 30 min of aerobic exercise without signs/symptoms of physical distress. Continue with 30 min of aerobic exercise without signs/symptoms of physical distress. Continue with 30 min of aerobic exercise without signs/symptoms of physical distress.   Intensity -- THRR unchanged THRR unchanged THRR unchanged THRR unchanged     Progression   Progression -- Continue to progress workloads to maintain intensity without signs/symptoms of physical distress. Continue to progress workloads to maintain intensity without signs/symptoms of physical distress. Continue to progress workloads to maintain intensity without signs/symptoms of physical distress. Continue to progress workloads to maintain intensity without signs/symptoms of physical distress.   Average METs 1.16  1.5 1.38 1.85 2.06     Resistance Training   Training Prescription -- Yes Yes -- --   Weight -- 4lb 4 lb 4 lb 4 lb   Reps -- 10-15 10-15 10-15 10-15     Interval Training   Interval Training -- No No No No     NuStep   Level -- 2 1 -- 3   Minutes -- 15 30 -- 30   METs -- 1.6 1.6 -- 2.2     T5 Nustep   Level -- -- -- 2 1   Minutes -- -- -- 30 30   METs -- -- -- 2 2     Biostep-RELP   Level -- 1 -- -- 3   Minutes -- 15 -- -- 30   METs -- 2 -- -- 2     Oxygen    Maintain Oxygen  Saturation -- 88% or higher 88% or higher 88% or higher 88% or higher      Exercise Comments:   Exercise Goals and Review:   Exercise Goals     Row Name 05/02/24 1548             Exercise Goals   Increase Physical Activity Yes       Intervention Provide advice, education, support and counseling about physical activity/exercise needs.;Develop an individualized exercise prescription for aerobic and resistive training based on initial evaluation findings, risk stratification, comorbidities and participant's personal goals.       Expected Outcomes Short Term: Attend rehab on a regular basis to increase amount of physical activity.;Long Term: Add in home exercise to make exercise part of routine and to increase amount of physical activity.;Long Term: Exercising regularly at least 3-5 days a week.       Increase Strength and Stamina Yes       Intervention Provide advice, education, support and counseling about physical activity/exercise needs.;Develop an individualized exercise prescription for aerobic and  resistive training based on initial evaluation findings, risk stratification, comorbidities and participant's personal goals.       Expected Outcomes Short Term: Increase workloads from initial exercise prescription for resistance, speed, and METs.;Short Term: Perform resistance training exercises routinely during rehab and add in resistance training at home;Long Term: Improve cardiorespiratory  fitness, muscular endurance and strength as measured by increased METs and functional capacity ( )       Able to understand and use rate of perceived exertion (RPE) scale Yes       Intervention Provide education and explanation on how to use RPE scale       Expected Outcomes Short Term: Able to use RPE daily in rehab to express subjective intensity level;Long Term:  Able to use RPE to guide intensity level when exercising independently       Able to understand and use Dyspnea scale Yes       Intervention Provide education and explanation on how to use Dyspnea scale       Expected Outcomes Short Term: Able to use Dyspnea scale daily in rehab to express subjective sense of shortness of breath during exertion;Long Term: Able to use Dyspnea scale to guide intensity level when exercising independently       Knowledge and understanding of Target Heart Rate Range (THRR) Yes       Intervention Provide education and explanation of THRR including how the numbers were predicted and where they are located for reference       Expected Outcomes Short Term: Able to state/look up THRR;Short Term: Able to use daily as guideline for intensity in rehab;Long Term: Able to use THRR to govern intensity when exercising independently       Able to check pulse independently Yes       Intervention Provide education and demonstration on how to check pulse in carotid and radial arteries.;Review the importance of being able to check your own pulse for safety during independent exercise       Expected Outcomes Short Term: Able to explain why pulse checking is important during independent exercise;Long Term: Able to check pulse independently and accurately       Understanding of Exercise Prescription Yes       Intervention Provide education, explanation, and written materials on patient's individual exercise prescription       Expected Outcomes Short Term: Able to explain program exercise prescription;Long Term: Able to explain  home exercise prescription to exercise independently          Exercise Goals Re-Evaluation :  Exercise Goals Re-Evaluation     Row Name 05/15/24 0732 05/23/24 1118 06/07/24 1103 06/21/24 1734 07/02/24 1546     Exercise Goal Re-Evaluation   Exercise Goals Review Increase Physical Activity;Able to understand and use rate of perceived exertion (RPE) scale;Knowledge and understanding of Target Heart Rate Range (THRR);Understanding of Exercise Prescription;Increase Strength and Stamina;Able to check pulse independently;Able to understand and use Dyspnea scale Increase Physical Activity;Increase Strength and Stamina;Understanding of Exercise Prescription Increase Physical Activity;Increase Strength and Stamina;Understanding of Exercise Prescription Increase Physical Activity;Increase Strength and Stamina;Understanding of Exercise Prescription Increase Physical Activity;Increase Strength and Stamina;Understanding of Exercise Prescription   Comments Reviewed RPE and dyspnea scale, THR and program prescription with pt today.  Pt voiced understanding and was given a copy of goals to take home. Felicia Woods is off to a good start in the program, and was able to attend her first 2 sessions during this review period. During these sessions she was able to use  the T4 nustep at level 2, and the biostep at level 1. We will continue to monitor her progress in the program. Felicia Woods only attended two sessions of rehab since the last review. She worked on the T4 nustep for 30 minutes during both sessions. She did see a decrease in her workload from level 2 to level 1 on the T4 nustep. We will continue to monitor her progress in the program. Felicia Woods only attended two sessions of rehab since the last review as well as the previous review. She increased her level on the T5 nustep to level 2 for 30 minutes. We will continue to monitor her progress in the program. Felicia Woods is doing well in rehab. She was recently able to increase from level  1 to 3 on the biostep, and increase from level 1 to 3 on the T4 nustep. We will continue to monitor her progress in the program.   Expected Outcomes Short: Use RPE daily to regulate intensity. Long: Follow program prescription in THR. Short: Continue to follow exercise prescription. Long: Continue exercise to improve strength and stamina. Short: Attend rehab more consistently. Long: Continue exercise to improve strength and stamina. Short: Attend rehab more consistently. Long: Continue exercise to improve strength and stamina. Short: Attend rehab more consistently. Long: Continue exercise to improve strength and stamina.      Discharge Exercise Prescription (Final Exercise Prescription Changes):  Exercise Prescription Changes - 07/02/24 1500       Response to Exercise   Blood Pressure (Admit) 102/60    Blood Pressure (Exercise) 138/60    Blood Pressure (Exit) 108/64    Heart Rate (Admit) 73 bpm    Heart Rate (Exercise) 91 bpm    Heart Rate (Exit) 86 bpm    Rating of Perceived Exertion (Exercise) 13    Symptoms none    Duration Continue with 30 min of aerobic exercise without signs/symptoms of physical distress.    Intensity THRR unchanged      Progression   Progression Continue to progress workloads to maintain intensity without signs/symptoms of physical distress.    Average METs 2.06      Resistance Training   Weight 4 lb    Reps 10-15      Interval Training   Interval Training No      NuStep   Level 3    Minutes 30    METs 2.2      T5 Nustep   Level 1    Minutes 30    METs 2      Biostep-RELP   Level 3    Minutes 30    METs 2      Oxygen    Maintain Oxygen  Saturation 88% or higher          Nutrition:  Target Goals: Understanding of nutrition guidelines, daily intake of sodium 1500mg , cholesterol 200mg , calories 30% from fat and 7% or less from saturated fats, daily to have 5 or more servings of fruits and vegetables.  Education: Nutrition 1 -Group  instruction provided by verbal, written material, interactive activities, discussions, models, and posters to present general guidelines for heart healthy nutrition including macronutrients, label reading, and promoting whole foods over processed counterparts. Education serves as pensions consultant of discussion of heart healthy eating for all. Written material provided at class time.    Education: Nutrition 2 -Group instruction provided by verbal, written material, interactive activities, discussions, models, and posters to present general guidelines for heart healthy nutrition including sodium, cholesterol, and saturated fat. Providing  guidance of habit forming to improve blood pressure, cholesterol, and body weight. Written material provided at class time.     Biometrics:  Pre Biometrics - 05/02/24 1549       Pre Biometrics   Height 5' 6.14 (1.68 m)    Weight 243 lb 11.2 oz (110.5 kg)    Waist Circumference 48.2 inches    Hip Circumference 55.8 inches    Waist to Hip Ratio 0.86 %    BMI (Calculated) 39.17    Single Leg Stand 23 seconds           Nutrition Therapy Plan and Nutrition Goals:  Nutrition Therapy & Goals - 05/02/24 1549       Personal Nutrition Goals   Nutrition Goal will meet w/ RD on 11/5      Intervention Plan   Intervention Prescribe, educate and counsel regarding individualized specific dietary modifications aiming towards targeted core components such as weight, hypertension, lipid management, diabetes, heart failure and other comorbidities.    Expected Outcomes Short Term Goal: Understand basic principles of dietary content, such as calories, fat, sodium, cholesterol and nutrients.          Nutrition Assessments:  MEDIFICTS Score Key: >=70 Need to make dietary changes  40-70 Heart Healthy Diet <= 40 Therapeutic Level Cholesterol Diet  Flowsheet Row Cardiac Rehab from 05/15/2024 in [facility] Cardiac and Pulmonary Rehab  Picture Your Plate Total Score on  Admission 43   Picture Your Plate Scores: <59 Unhealthy dietary pattern with much room for improvement. 41-50 Dietary pattern unlikely to meet recommendations for good health and room for improvement. 51-60 More healthful dietary pattern, with some room for improvement.  >60 Healthy dietary pattern, although there may be some specific behaviors that could be improved.    Nutrition Goals Re-Evaluation:  Nutrition Goals Re-Evaluation     Row Name 05/24/24 0743 07/10/24 0806           Goals   Comment Felicia Woods would like to set up a meeting with the RD, she had to cancel her appointment on 11/5. Felicia Woods would still like to set up a meeting with the RD. She previously had to cancel her appointment on 11/5.      Expected Outcome Short: Meet with RD Long: Follow plan/goals set with RD Short: Meet with RD. Long: Follow nutritional guidelines set with RD.         Nutrition Goals Discharge (Final Nutrition Goals Re-Evaluation):  Nutrition Goals Re-Evaluation - 07/10/24 0806       Goals   Comment Felicia Woods would still like to set up a meeting with the RD. She previously had to cancel her appointment on 11/5.    Expected Outcome Short: Meet with RD. Long: Follow nutritional guidelines set with RD.          Psychosocial: Target Goals: Acknowledge presence or absence of significant depression and/or stress, maximize coping skills, provide positive support system. Participant is able to verbalize types and ability to use techniques and skills needed for reducing stress and depression.   Education: Stress, Anxiety, and Depression - Group verbal and visual presentation to define topics covered.  Reviews how body is impacted by stress, anxiety, and depression.  Also discusses healthy ways to reduce stress and to treat/manage anxiety and depression. Written material provided at class time. Flowsheet Row Cardiac Rehab from 06/10/2022 in [facility] Cardiac and Pulmonary Rehab  Education need identified  02/08/22    Education: Sleep Hygiene -Provides group verbal and written instruction about how  sleep can affect your health.  Define sleep hygiene, discuss sleep cycles and impact of sleep habits. Review good sleep hygiene tips.   Initial Review & Psychosocial Screening:  Initial Psych Review & Screening - 04/26/24 1409       Initial Review   Current issues with Current Stress Concerns    Source of Stress Concerns Retirement/disability;Unable to perform yard/household activities    Comments reports stress due to uncontrolled pain and frustration that she is not able to get pain medication that has worked for her in the past      Family Dynamics   Good Support System? Yes      Barriers   Psychosocial barriers to participate in program There are no identifiable barriers or psychosocial needs.;The patient should benefit from training in stress management and relaxation.      Screening Interventions   Interventions Encouraged to exercise;To provide support and resources with identified psychosocial needs;Provide feedback about the scores to participant    Expected Outcomes Short Term goal: Utilizing psychosocial counselor, staff and physician to assist with identification of specific Stressors or current issues interfering with healing process. Setting desired goal for each stressor or current issue identified.;Long Term Goal: Stressors or current issues are controlled or eliminated.;Short Term goal: Identification and review with participant of any Quality of Life or Depression concerns found by scoring the questionnaire.;Long Term goal: The participant improves quality of Life and PHQ9 Scores as seen by post scores and/or verbalization of changes          Quality of Life Scores:   Quality of Life - 05/15/24 0735       Quality of Life   Select Quality of Life      Quality of Life Scores   Health/Function Pre 7.9 %    Socioeconomic Pre 23.38 %    Psych/Spiritual Pre 13.71 %     Family Pre 27.6 %    GLOBAL Pre 15.41 %         Scores of 19 and below usually indicate a poorer quality of life in these areas.  A difference of  2-3 points is a clinically meaningful difference.  A difference of 2-3 points in the total score of the Quality of Life Index has been associated with significant improvement in overall quality of life, self-image, physical symptoms, and general health in studies assessing change in quality of life.  PHQ-9: Review Flowsheet  More data exists      06/07/2024 05/02/2024 12/19/2023 09/05/2023 12/17/2022  Depression screen PHQ 2/9  Decreased Interest 1 2 0 0 0  Down, Depressed, Hopeless 0 2 0 0 0  PHQ - 2 Score 1 4 0 0 0  Altered sleeping 1 3 - - -  Tired, decreased energy 1 2 - - -  Change in appetite 0 2 - - -  Feeling bad or failure about yourself  0 0 - - -  Trouble concentrating 0 0 - - -  Moving slowly or fidgety/restless 0 1 - - -  Suicidal thoughts 0 0 - - -  PHQ-9 Score 3 12  - - -  Difficult doing work/chores Somewhat difficult Somewhat difficult - - -    Details       Data saved with a previous flowsheet row definition        Interpretation of Total Score  Total Score Depression Severity:  1-4 = Minimal depression, 5-9 = Mild depression, 10-14 = Moderate depression, 15-19 = Moderately severe depression, 20-27 =  Severe depression   Psychosocial Evaluation and Intervention:  Psychosocial Evaluation - 04/26/24 1414       Psychosocial Evaluation & Interventions   Interventions Stress management education;Relaxation education;Encouraged to exercise with the program and follow exercise prescription    Comments Felicia Woods is returning to cardiac rehab due to NSTEMI in August and continued, unrelieved chest pain. She expresses frustration that she is unable to acheive adequate pain control with Tylenol , which is what the doctors have encouraged her to use. She states that when she has chest pain, it also causes shortness of breath and  left arm numbess and typically takes 15-20 minutes before she is able to return to what she was doing. She states these episodes regularly happen 5-6 times per day. Felicia Woods's current job is watching her grandchildren each week. She does state that she is in need of a right knee replacement, however she has not been able to have one due to her cardiac issues.    Expected Outcomes Short: Attend cardiac rehab for education and exercise. Long: Develop and maintain positive self care habits.    Continue Psychosocial Services  Follow up required by staff          Psychosocial Re-Evaluation:  Psychosocial Re-Evaluation     Row Name 06/07/24 [phone] 06/19/24 9188 07/10/24 0806         Psychosocial Re-Evaluation   Current issues with -- Current Stress Concerns Current Stress Concerns     Comments .Reviewed patient health questionnaire (PHQ-9) with patient for follow up. Previously, patients score indicated signs/symptoms of depression.  Reviewed to see if patient is improving symptom wise while in program.  Score improved and patient states that it is because she has been more involed in her church group which has helped her mood. Pt states that the only major stressor for her is her heart health. She reports that her angina causes her pain every day. For stress relief she enjoys spending time with her grandkids, playing crossword puzzles on her phone, and exercising. She reports that she is not sleeping well, as she only gets about 2-3 hours a night. She states that she sleeps sitting up because her chest hurts when she lays all the way down. She has anxiety about her health that keeps her from sleeping most nights. She has sleeping pills but reports that they are not effective for her. She sees her doctor today and plans to ask if there is anything else she could try to help with her sleep concerns. Felicia Woods states that she is managing her stress well sometimes, but for the most part tries not to let things  bother her. Her angina continues to be her main stresser as she states that it inhibits her from doing the things she wants to do. She states that she is still having trouble with sleep also. She recently spoke to her doctor about her sleep concerns but reports that they were unable to prescribe her anything for sleep as she is on too many medications already.     Expected Outcomes Short: Continue to attend [facility] regularly for regular exercise and social engagement. Long: Continue to improve symptoms and manage a positive mental state. Short: Speak to doctor about sleep concerns. Long: Continue to improve symptoms and manage a positive mental state. Short: Continue to attend rehab for mental boost. Long: Continue to improve symptoms and maintain a positive mental state.     Interventions Encouraged to attend Cardiac Rehabilitation for the exercise Encouraged to attend Cardiac  Rehabilitation for the exercise Encouraged to attend Cardiac Rehabilitation for the exercise     Continue Psychosocial Services  Follow up required by staff Follow up required by staff Follow up required by staff        Psychosocial Discharge (Final Psychosocial Re-Evaluation):  Psychosocial Re-Evaluation - 07/10/24 0806       Psychosocial Re-Evaluation   Current issues with Current Stress Concerns    Comments Felicia Woods states that she is managing her stress well sometimes, but for the most part tries not to let things bother her. Her angina continues to be her main stresser as she states that it inhibits her from doing the things she wants to do. She states that she is still having trouble with sleep also. She recently spoke to her doctor about her sleep concerns but reports that they were unable to prescribe her anything for sleep as she is on too many medications already.    Expected Outcomes Short: Continue to attend rehab for mental boost. Long: Continue to improve symptoms and maintain a positive mental state.     Interventions Encouraged to attend Cardiac Rehabilitation for the exercise    Continue Psychosocial Services  Follow up required by staff          Vocational Rehabilitation: Provide vocational rehab assistance to qualifying candidates.   Vocational Rehab Evaluation & Intervention:  Vocational Rehab - 04/26/24 1408       Initial Vocational Rehab Evaluation & Intervention   Assessment shows need for Vocational Rehabilitation No      Vocational Rehab Re-Evaulation   Comments no request for VR          Education: Education Goals: Education classes will be provided on a variety of topics geared toward better understanding of heart health and risk factor modification. Participant will state understanding/return demonstration of topics presented as noted by education test scores.  Learning Barriers/Preferences:  Learning Barriers/Preferences - 04/26/24 1408       Learning Barriers/Preferences   Learning Barriers Sight    Learning Preferences Individual Instruction;Verbal Instruction          General Cardiac Education Topics:  AED/CPR: - Group verbal and written instruction with the use of models to demonstrate the basic use of the AED with the basic ABC's of resuscitation.   Test and Procedures: - Group verbal and visual presentation and models provide information about basic cardiac anatomy and function. Reviews the testing methods done to diagnose heart disease and the outcomes of the test results. Describes the treatment choices: Medical Management, Angioplasty, or Coronary Bypass Surgery for treating various heart conditions including Myocardial Infarction, Angina, Valve Disease, and Cardiac Arrhythmias. Written material provided at class time. Flowsheet Row Cardiac Rehab from 06/10/2022 in [facility] Cardiac and Pulmonary Rehab  Education need identified 02/08/22  Date 06/10/22  Educator sb  Instruction Review Code 1- Verbalizes Understanding    Medication Safety: -  Group verbal and visual instruction to review commonly prescribed medications for heart and lung disease. Reviews the medication, class of the drug, and side effects. Includes the steps to properly store meds and maintain the prescription regimen. Written material provided at class time.   Intimacy: - Group verbal instruction through game format to discuss how heart and lung disease can affect sexual intimacy. Written material provided at class time.   Know Your Numbers and Heart Failure: - Group verbal and visual instruction to discuss disease risk factors for cardiac and pulmonary disease and treatment options.  Reviews associated critical values for  Overweight/Obesity, Hypertension, Cholesterol, and Diabetes.  Discusses basics of heart failure: signs/symptoms and treatments.  Introduces Heart Failure Zone chart for action plan for heart failure. Written material provided at class time.   Infection Prevention: - Provides verbal and written material to individual with discussion of infection control including proper hand washing and proper equipment cleaning during exercise session. Flowsheet Row Cardiac Rehab from 05/02/2024 in [facility] Cardiac and Pulmonary Rehab  Date 05/02/24  Educator MB  Instruction Review Code 1- Verbalizes Understanding    Falls Prevention: - Provides verbal and written material to individual with discussion of falls prevention and safety. Flowsheet Row Cardiac Rehab from 05/02/2024 in [facility] Cardiac and Pulmonary Rehab  Date 05/02/24  Educator MB  Instruction Review Code 1- Verbalizes Understanding    Other: -Provides group and verbal instruction on various topics (see comments)   Knowledge Questionnaire Score:  Knowledge Questionnaire Score - 05/15/24 0735       Knowledge Questionnaire Score   Pre Score 22-26          Core Components/Risk Factors/Patient Goals at Admission:  Personal Goals and Risk Factors at Admission - 05/02/24 1550       Core  Components/Risk Factors/Patient Goals on Admission    Weight Management Yes;Weight Loss    Intervention Weight Management: Develop a combined nutrition and exercise program designed to reach desired caloric intake, while maintaining appropriate intake of nutrient and fiber, sodium and fats, and appropriate energy expenditure required for the weight goal.;Weight Management: Provide education and appropriate resources to help participant work on and attain dietary goals.;Weight Management/Obesity: Establish reasonable short term and long term weight goals.;Obesity: Provide education and appropriate resources to help participant work on and attain dietary goals.    Admit Weight 243 lb 11.2 oz (110.5 kg)    Goal Weight: Short Term 200 lb (90.7 kg)    Goal Weight: Long Term 180 lb (81.6 kg)    Expected Outcomes Short Term: Continue to assess and modify interventions until short term weight is achieved;Long Term: Adherence to nutrition and physical activity/exercise program aimed toward attainment of established weight goal;Weight Loss: Understanding of general recommendations for a balanced deficit meal plan, which promotes 1-2 lb weight loss per week and includes a negative energy balance of [phone] kcal/d;Understanding recommendations for meals to include 15-35% energy as protein, 25-35% energy from fat, 35-60% energy from carbohydrates, less than 200mg  of dietary cholesterol, 20-35 gm of total fiber daily;Understanding of distribution of calorie intake throughout the day with the consumption of 4-5 meals/snacks    Diabetes Yes    Intervention Provide education about signs/symptoms and action to take for hypo/hyperglycemia.;Provide education about proper nutrition, including hydration, and aerobic/resistive exercise prescription along with prescribed medications to achieve blood glucose in normal ranges: Fasting glucose 65-99 mg/dL    Expected Outcomes Short Term: Participant verbalizes understanding of the  signs/symptoms and immediate care of hyper/hypoglycemia, proper foot care and importance of medication, aerobic/resistive exercise and nutrition plan for blood glucose control.;Long Term: Attainment of HbA1C < 7%.    Hypertension Yes    Intervention Provide education on lifestyle modifcations including regular physical activity/exercise, weight management, moderate sodium restriction and increased consumption of fresh fruit, vegetables, and low fat dairy, alcohol moderation, and smoking cessation.;Monitor prescription use compliance.    Expected Outcomes Short Term: Continued assessment and intervention until BP is < 140/28mm HG in hypertensive participants. < 130/35mm HG in hypertensive participants with diabetes, heart failure or chronic kidney disease.;Long Term: Maintenance of blood pressure at goal  levels.    Lipids Yes    Intervention Provide education and support for participant on nutrition & aerobic/resistive exercise along with prescribed medications to achieve LDL 70mg , HDL >40mg .    Expected Outcomes Short Term: Participant states understanding of desired cholesterol values and is compliant with medications prescribed. Participant is following exercise prescription and nutrition guidelines.;Long Term: Cholesterol controlled with medications as prescribed, with individualized exercise RX and with personalized nutrition plan. Value goals: LDL < 70mg , HDL > 40 mg.    Stress Yes    Intervention Offer individual and/or small group education and counseling on adjustment to heart disease, stress management and health-related lifestyle change. Teach and support self-help strategies.;Refer participants experiencing significant psychosocial distress to appropriate mental health specialists for further evaluation and treatment. When possible, include family members and significant others in education/counseling sessions.    Expected Outcomes Short Term: Participant demonstrates changes in health-related  behavior, relaxation and other stress management skills, ability to obtain effective social support, and compliance with psychotropic medications if prescribed.;Long Term: Emotional wellbeing is indicated by absence of clinically significant psychosocial distress or social isolation.          Education:Diabetes - Individual verbal and written instruction to review signs/symptoms of diabetes, desired ranges of glucose level fasting, after meals and with exercise. Acknowledge that pre and post exercise glucose checks will be done for 3 sessions at entry of program. Flowsheet Row Cardiac Rehab from 05/02/2024 in [facility] Cardiac and Pulmonary Rehab  Date 05/02/24  Educator MB  Instruction Review Code 1- Verbalizes Understanding    Core Components/Risk Factors/Patient Goals Review:   Goals and Risk Factor Review     Row Name 06/07/24 0759 06/19/24 0807 07/10/24 0802         Core Components/Risk Factors/Patient Goals Review   Personal Goals Review Weight Management/Obesity;Hypertension Weight Management/Obesity;Hypertension;Diabetes Weight Management/Obesity;Hypertension;Diabetes     Review Felicia Woods states that she is betting a blood pressure cuff to check her BP at home. Infomred her how to use her blood pressure cuff and to obtain readings and compair to what we get in Rehab. Felicia Woods states that she would like to lose about 15 lbs so that she can qualify for a heart transplant. She has been taking mounjaro  shots and exercising in rehab and at home to work towards weight loss. She is checking her BP daily and reports that it has stayed within normal ranges. She is also checking her blood glucose levels twice a day, before breakfast and before bed, and reports that her levels have been well controlled. She reports taking all of her medications as prescribed as well. Felicia Woods states that she is still working on trying to lose weight so that she can qualify for a heart transplant. She has been taking mounjaro   shots and exercising in rehab and at home to work towards weight loss. She is checking her BP at home and reports that her systolic readings are between 889-879 and her diastolic readings stay between 70-80. She is also checking her blood glucose levels before breakfast and before bed, and reports that her levels have been well controlled. She reports taking all of her medications as prescribed as well.     Expected Outcomes Short: Start using blood pressure cuff when obtained. Long: maintain blood pressure readings at home. Short: Continue to work towards weight loss through exercise. Long: Continue to manage lifestyle risk factors. Short: Continue to work towards weight loss through exercise. Long: Continue to manage lifestyle risk factors.  Core Components/Risk Factors/Patient Goals at Discharge (Final Review):   Goals and Risk Factor Review - 07/10/24 0802       Core Components/Risk Factors/Patient Goals Review   Personal Goals Review Weight Management/Obesity;Hypertension;Diabetes    Review Felicia Woods states that she is still working on trying to lose weight so that she can qualify for a heart transplant. She has been taking mounjaro  shots and exercising in rehab and at home to work towards weight loss. She is checking her BP at home and reports that her systolic readings are between 889-879 and her diastolic readings stay between 70-80. She is also checking her blood glucose levels before breakfast and before bed, and reports that her levels have been well controlled. She reports taking all of her medications as prescribed as well.    Expected Outcomes Short: Continue to work towards weight loss through exercise. Long: Continue to manage lifestyle risk factors.          ITP Comments:  ITP Comments     Row Name 04/26/24 1347 05/02/24 1540 05/15/24 0732 05/23/24 1007 06/20/24 1102   ITP Comments Initial phone call completed. Diagnosis can be found in [facility] 10/13. EP Orientation scheduled  for 05/02/24 at 1:30 pm. Completed and gym orientation for cardiac rehab. Initial ITP created and sent for review to Dr. Oneil Pinal, Medical Director. First full day of exercise!  Patient was oriented to gym and equipment including functions, settings, policies, and procedures.  Patient's individual exercise prescription and treatment plan were reviewed.  All starting workloads were established based on the results of the 6 minute walk test done at initial orientation visit.  The plan for exercise progression was also introduced and progression will be customized based on patient's performance and goals. 30 Day review completed. Medical Director ITP review done, changes made as directed, and signed approval by Medical Director.  New to program. 30 Day review completed. Medical Director ITP review done, changes made as directed, and signed approval by Medical Director.    Row Name 07/18/24 1201           ITP Comments 30 Day review completed. Medical Director ITP review done, changes made as directed, and signed approval by Medical Director.          Comments: 30 day review     [1]  Current Outpatient Medications:    amLODipine  (NORVASC ) 5 MG tablet, Take 1 tablet (5 mg total) by mouth daily., Disp: 90 tablet, Rfl: 1   aspirin  EC 81 MG tablet, Take 81 mg by mouth daily., Disp: , Rfl:    atorvastatin  (LIPITOR ) 40 MG tablet, Take 2 tablets (80 mg total) by mouth daily., Disp: 90 tablet, Rfl: 3   Blood Glucose Monitoring Suppl (ONETOUCH VERIO FLEX SYSTEM) w/Device KIT, Use as directed twice a daily DX E11.65, Disp: 1 kit, Rfl: 0   Budeson-Glycopyrrol-Formoterol  (BREZTRI  AEROSPHERE) 160-9-4.8 MCG/ACT AERO, Inhale 2 puffs into the lungs 2 (two) times daily. (Patient taking differently: Inhale 2 puffs into the lungs 2 (two) times daily as needed.), Disp: 10.7 g, Rfl: 11   clopidogrel  (PLAVIX ) 75 MG tablet, Take 1 tablet (75 mg total) by mouth daily., Disp: 90 tablet, Rfl: 1   colchicine  0.6 MG  tablet, Take 1 tablet (0.6 mg total) by mouth 2 (two) times daily for 14 days., Disp: 28 tablet, Rfl: 0   empagliflozin  (JARDIANCE ) 25 MG TABS tablet, Take one tab a day for diabetes, Disp: 90 tablet, Rfl: 3   escitalopram  (LEXAPRO ) 10 MG  tablet, Take 1 tablet (10 mg total) by mouth daily., Disp: 30 tablet, Rfl: 2   Evolocumab  (REPATHA  SURECLICK) 140 MG/ML SOAJ, Inject 140 mg into the skin every 14 (fourteen) days. (Patient not taking: Reported on 07/02/2024), Disp: 2 mL, Rfl: 2   ezetimibe  (ZETIA ) 10 MG tablet, Take 1 tablet (10 mg total) by mouth daily., Disp: 90 tablet, Rfl: 1   famotidine  (PEPCID ) 20 MG tablet, Take 1 tablet (20 mg total) by mouth 2 (two) times daily., Disp: 90 tablet, Rfl: 1   furosemide  (LASIX ) 40 MG tablet, Take 1 tablet (40 mg total) by mouth 2 (two) times daily., Disp: 180 tablet, Rfl: 1   gabapentin  (NEURONTIN ) 100 MG capsule, Take 1 capsule (100 mg total) by mouth 3 (three) times daily as needed (Pain)., Disp: 30 capsule, Rfl: 0   glucose blood (ONETOUCH VERIO) test strip, Use as instructed twice daily DX E11.65, Disp: 100 each, Rfl: 3   hydrALAZINE  (APRESOLINE ) 10 MG tablet, Take 1 tablet (10 mg total) by mouth 3 (three) times daily., Disp: 270 tablet, Rfl: 0   isosorbide  mononitrate (IMDUR ) 120 MG 24 hr tablet, Take 1 tablet (120 mg total) by mouth 2 (two) times daily., Disp: 60 tablet, Rfl: 1   losartan  (COZAAR ) 100 MG tablet, Take 0.5 tablets (50 mg total) by mouth daily., Disp: 90 tablet, Rfl: 3   metoprolol  succinate (TOPROL -XL) 100 MG 24 hr tablet, Take 1 tablet (100 mg total) by mouth daily. (Patient taking differently: Take 100 mg by mouth 2 (two) times daily.), Disp: 30 tablet, Rfl: 1   montelukast  (SINGULAIR ) 10 MG tablet, Take 1 tablet (10 mg total) by mouth at bedtime., Disp: 90 tablet, Rfl: 1   nitroGLYCERIN  (NITROSTAT ) 0.4 MG SL tablet, DISSOLVE ONE TABLET UNDER THE TONGUE EVERY 5 MINUTES AS NEEDED FOR CHEST PAIN.  DO NOT EXCEED A TOTAL OF 3 DOSES IN 15  MINUTES (Patient taking differently: Place 0.4 mg under the tongue every 5 (five) minutes as needed for chest pain.), Disp: 30 tablet, Rfl: 3   OneTouch Delica Lancets 30G MISC, USE 1  TO CHECK GLUCOSE TWICE DAILY AS DIRECTED, Disp: 100 each, Rfl: 0   oxybutynin  (DITROPAN ) 5 MG tablet, Take 1 tablet (5 mg total) by mouth daily., Disp: 90 tablet, Rfl: 1   oxyCODONE -acetaminophen  (PERCOCET) 5-325 MG tablet, Take 1 tablet by mouth every 4 (four) hours as needed for severe pain (pain score 7-10)., Disp: 12 tablet, Rfl: 0   PROAIR  RESPICLICK 108 (90 Base) MCG/ACT AEPB, Inhale 2 puffs into the lungs every 6 (six) hours as needed., Disp: 3 each, Rfl: 1   ranolazine  (RANEXA ) 1000 MG SR tablet, Take 1 tablet (1,000 mg total) by mouth 2 (two) times daily., Disp: 60 tablet, Rfl: 2   tirzepatide  (MOUNJARO ) 5 MG/0.5ML Pen, Inject 5 mg into the skin once a week., Disp: 2 mL, Rfl: 2   zolpidem  (AMBIEN ) 5 MG tablet, Take 1 tablet (5 mg total) by mouth at bedtime as needed. for sleep, Disp: 30 tablet, Rfl: 2 [2]  Social History Tobacco Use  Smoking Status Never  Smokeless Tobacco Never

## 2024-07-19 ENCOUNTER — Encounter

## 2024-07-20 ENCOUNTER — Encounter

## 2024-07-23 ENCOUNTER — Encounter

## 2024-07-24 ENCOUNTER — Telehealth: Payer: Self-pay | Admitting: Internal Medicine

## 2024-07-24 ENCOUNTER — Encounter

## 2024-07-24 DIAGNOSIS — I214 Non-ST elevation (NSTEMI) myocardial infarction: Secondary | ICD-10-CM

## 2024-07-24 NOTE — Telephone Encounter (Signed)
 SS order emailed to [facility]

## 2024-07-24 NOTE — Progress Notes (Signed)
 Daily Session Note  Patient Details  Name: Felicia Woods MRN: [id number] Date of Birth: October 26, 1955 Referring Provider:   Flowsheet Row Cardiac Rehab from 05/02/2024 in [facility] Cardiac and Pulmonary Rehab  Referring Provider Florencio Kava, MD    Encounter Date: 07/24/2024  Check In:  Session Check In - 07/24/24 9277       Check-In   Supervising physician immediately available to respond to emergencies See telemetry face sheet for immediately available ER MD    Location [facility]-Cardiac & Pulmonary Rehab    Staff Present Burnard Davenport RN,BSN,MPA;Margaret Best, MS, Exercise Physiologist;Noah Tickle, BS, Exercise Physiologist;Jason Elnor RDN,LDN    Virtual Visit No    Medication changes reported     No    Fall or balance concerns reported    No    Tobacco Cessation No Change    Warm-up and Cool-down Performed on first and last piece of equipment    Resistance Training Performed Yes    VAD Patient? No    PAD/SET Patient? No      Pain Assessment   Currently in Pain? No/denies             Tobacco Use History[1]  Goals Met:  Independence with exercise equipment Exercise tolerated well No report of concerns or symptoms today Strength training completed today  Goals Unmet:  Not Applicable  Comments: Pt able to follow exercise prescription today without complaint.  Will continue to monitor for progression.    Dr. Oneil Pinal is Medical Director for [facility] Cardiac Rehabilitation.  Dr. Fuad Aleskerov is Medical Director for [facility] Pulmonary Rehabilitation.    [1]  Social History Tobacco Use  Smoking Status Never  Smokeless Tobacco Never

## 2024-07-25 ENCOUNTER — Encounter

## 2024-07-26 ENCOUNTER — Encounter

## 2024-07-27 ENCOUNTER — Encounter

## 2024-07-30 ENCOUNTER — Encounter

## 2024-07-31 ENCOUNTER — Encounter

## 2024-08-01 ENCOUNTER — Encounter

## 2024-08-02 ENCOUNTER — Encounter: Admitting: Emergency Medicine

## 2024-08-02 DIAGNOSIS — I214 Non-ST elevation (NSTEMI) myocardial infarction: Secondary | ICD-10-CM

## 2024-08-02 NOTE — Progress Notes (Signed)
 Daily Session Note  Patient Details  Name: Felicia Woods MRN: [id number] Date of Birth: April 17, 1956 Referring Provider:   Flowsheet Row Cardiac Rehab from 05/02/2024 in [facility] Cardiac and Pulmonary Rehab  Referring Provider Florencio Kava, MD    Encounter Date: 08/02/2024  Check In:  Session Check In - 08/02/24 0733       Check-In   Supervising physician immediately available to respond to emergencies See telemetry face sheet for immediately available ER MD    Location [facility]-Cardiac & Pulmonary Rehab    Staff Present Leita Franks RN,BSN;[facility] RCP,RRT,BSRT;Margaret Best, MS, Exercise Physiologist;Jason Elnor [facility]    Virtual Visit No    Medication changes reported     No    Fall or balance concerns reported    No    Tobacco Cessation No Change    Warm-up and Cool-down Performed on first and last piece of equipment    Resistance Training Performed Yes    VAD Patient? No    PAD/SET Patient? No      Pain Assessment   Currently in Pain? No/denies             Tobacco Use History[1]  Goals Met:  Independence with exercise equipment Exercise tolerated well No report of concerns or symptoms today Strength training completed today  Goals Unmet:  Not Applicable  Comments: Pt able to follow exercise prescription today without complaint.  Will continue to monitor for progression.    Dr. Oneil Pinal is Medical Director for [facility] Cardiac Rehabilitation.  Dr. Fuad Aleskerov is Medical Director for [facility] Pulmonary Rehabilitation.    [1]  Social History Tobacco Use  Smoking Status Never  Smokeless Tobacco Never

## 2024-08-03 ENCOUNTER — Encounter

## 2024-08-06 ENCOUNTER — Encounter

## 2024-08-06 ENCOUNTER — Telehealth: Payer: Self-pay | Admitting: Internal Medicine

## 2024-08-06 NOTE — Telephone Encounter (Signed)
 Notified patient of CT appointment date, arrival time, location-[facility]

## 2024-08-07 ENCOUNTER — Encounter

## 2024-08-07 ENCOUNTER — Telehealth: Payer: Self-pay | Admitting: Physician Assistant

## 2024-08-07 NOTE — Telephone Encounter (Signed)
 All 2025 records uploaded to [email or web address] for [facility]

## 2024-08-08 ENCOUNTER — Encounter

## 2024-08-09 ENCOUNTER — Encounter: Attending: Internal Medicine

## 2024-08-09 DIAGNOSIS — I214 Non-ST elevation (NSTEMI) myocardial infarction: Secondary | ICD-10-CM

## 2024-08-09 NOTE — Progress Notes (Signed)
 Daily Session Note  Patient Details  Name: Felicia Woods MRN: [id number] Date of Birth: [birth year] Referring Provider:   Flowsheet Row Cardiac Rehab from 05/02/2024 in [facility] Cardiac [facility] Pul[facility]  Referring Provider Florencio Kava, MD    Encounter Date: 08/09/2024  Check In:  Session Check In - 08/09/24 0741       Check-In   Supervising physician immediately available to respond to emergencies See telemetry face sheet for immediately available ER MD    Location [facility]-Cardiac & Pulmonary Rehab    Staff Present Leita Franks RN,BSN;[facility] [place], [place], Exercise Physiologist    Virtual Visit No    Medication changes reported     No    Fall or balance concerns reported    No    Tobacco Cessation No Change    Warm-up and Cool-down Performed on first and last piece of equipment    Resistance Training Performed Yes    VAD Patient? No    PAD/SET Patient? No      Pain Assessment   Currently in Pain? No/denies             Tobacco Use History[1]  Goals Met:  Independence with exercise equipment Exercise tolerated well No report of concerns or symptoms today Strength training completed today  Goals Unmet:  Not Applicable  Comments: Pt able to follow exercise prescription today without complaint.  Will continue to monitor for progression.    Dr. Oneil Pinal is Medical Director for [facility] Cardiac Rehabilitation.  Dr. Fuad Aleskerov is Medical Director for [facility] Rehabilitation.    [1]  Social History Tobacco Use  Smoking Status Never  Smokeless Tobacco Never

## 2024-08-13 ENCOUNTER — Ambulatory Visit

## 2024-08-14 ENCOUNTER — Encounter

## 2024-08-16 ENCOUNTER — Encounter

## 2024-08-21 ENCOUNTER — Encounter

## 2024-08-23 ENCOUNTER — Encounter

## 2024-08-28 ENCOUNTER — Encounter

## 2024-08-30 ENCOUNTER — Encounter

## 2024-09-04 ENCOUNTER — Encounter: Attending: Internal Medicine

## 2024-09-06 ENCOUNTER — Encounter

## 2024-09-10 ENCOUNTER — Ambulatory Visit: Admitting: Physician Assistant

## 2024-09-11 ENCOUNTER — Encounter

## 2024-09-18 ENCOUNTER — Ambulatory Visit: Admitting: Internal Medicine

## 2024-12-20 ENCOUNTER — Ambulatory Visit: Admitting: Physician Assistant

## 2025-04-17 ENCOUNTER — Encounter: Admitting: Internal Medicine
